# Patient Record
Sex: Male | Born: 1935 | Race: White | Hispanic: No | Marital: Married | Smoking: Former smoker
Health system: Southern US, Community
[De-identification: ages and names within clinical notes are randomized; demographics above are authoritative.]

## PROBLEM LIST (undated history)

## (undated) DIAGNOSIS — E785 Hyperlipidemia, unspecified: Secondary | ICD-10-CM

## (undated) DIAGNOSIS — D1803 Hemangioma of intra-abdominal structures: Principal | ICD-10-CM

## (undated) DIAGNOSIS — R600 Localized edema: Secondary | ICD-10-CM

## (undated) DIAGNOSIS — I272 Pulmonary hypertension, unspecified: Secondary | ICD-10-CM

## (undated) DIAGNOSIS — I119 Hypertensive heart disease without heart failure: Secondary | ICD-10-CM

## (undated) DIAGNOSIS — I6203 Nontraumatic chronic subdural hemorrhage: Secondary | ICD-10-CM

## (undated) DIAGNOSIS — I471 Supraventricular tachycardia, unspecified: Secondary | ICD-10-CM

## (undated) DIAGNOSIS — N529 Male erectile dysfunction, unspecified: Secondary | ICD-10-CM

## (undated) DIAGNOSIS — I5032 Chronic diastolic (congestive) heart failure: Secondary | ICD-10-CM

## (undated) DIAGNOSIS — I071 Rheumatic tricuspid insufficiency: Secondary | ICD-10-CM

## (undated) DIAGNOSIS — I951 Orthostatic hypotension: Secondary | ICD-10-CM

## (undated) DIAGNOSIS — J9811 Atelectasis: Secondary | ICD-10-CM

## (undated) DIAGNOSIS — N200 Calculus of kidney: Secondary | ICD-10-CM

## (undated) DIAGNOSIS — N289 Disorder of kidney and ureter, unspecified: Secondary | ICD-10-CM

## (undated) DIAGNOSIS — N183 Chronic kidney disease, stage 3 unspecified: Secondary | ICD-10-CM

## (undated) DIAGNOSIS — L98499 Non-pressure chronic ulcer of skin of other sites with unspecified severity: Secondary | ICD-10-CM

## (undated) DIAGNOSIS — M109 Gout, unspecified: Secondary | ICD-10-CM

## (undated) DIAGNOSIS — C61 Malignant neoplasm of prostate: Secondary | ICD-10-CM

## (undated) DIAGNOSIS — R5382 Chronic fatigue, unspecified: Secondary | ICD-10-CM

## (undated) DIAGNOSIS — M1990 Unspecified osteoarthritis, unspecified site: Secondary | ICD-10-CM

## (undated) DIAGNOSIS — I480 Paroxysmal atrial fibrillation: Secondary | ICD-10-CM

## (undated) DIAGNOSIS — I340 Nonrheumatic mitral (valve) insufficiency: Secondary | ICD-10-CM

## (undated) DIAGNOSIS — I10 Essential (primary) hypertension: Secondary | ICD-10-CM

## (undated) DIAGNOSIS — M4800 Spinal stenosis, site unspecified: Secondary | ICD-10-CM

## (undated) DIAGNOSIS — E119 Type 2 diabetes mellitus without complications: Secondary | ICD-10-CM

## (undated) DIAGNOSIS — I2510 Atherosclerotic heart disease of native coronary artery without angina pectoris: Secondary | ICD-10-CM

## (undated) HISTORY — PX: CORONARY ARTERY BYPASS GRAFT: SHX141

## (undated) HISTORY — PX: BACK SURGERY: SHX140

## (undated) HISTORY — DX: Malignant neoplasm of prostate: C61

## (undated) HISTORY — DX: Atelectasis: J98.11

## (undated) HISTORY — PX: CARDIAC SURGERY: SHX584

## (undated) HISTORY — DX: Nontraumatic chronic subdural hemorrhage: I62.03

## (undated) HISTORY — DX: Male erectile dysfunction, unspecified: N52.9

## (undated) HISTORY — PX: CERVICAL SPINE SURGERY: SHX589

## (undated) HISTORY — DX: Nonrheumatic mitral (valve) insufficiency: I34.0

## (undated) HISTORY — DX: Pulmonary hypertension, unspecified: I27.20

## (undated) HISTORY — DX: Rheumatic tricuspid insufficiency: I07.1

## (undated) HISTORY — DX: Chronic diastolic (congestive) heart failure: I50.32

## (undated) HISTORY — DX: Gout, unspecified: M10.9

## (undated) HISTORY — PX: OTHER SURGICAL HISTORY: SHX169

## (undated) HISTORY — PX: JOINT REPLACEMENT: SHX530

## (undated) HISTORY — PX: EYE SURGERY: SHX253

## (undated) HISTORY — DX: Hypertensive heart disease without heart failure: I11.9

## (undated) HISTORY — DX: Chronic fatigue, unspecified: R53.82

## (undated) HISTORY — DX: Spinal stenosis, site unspecified: M48.00

## (undated) HISTORY — DX: Unspecified osteoarthritis, unspecified site: M19.90

---

## 2002-05-05 ENCOUNTER — Emergency Department (HOSPITAL_COMMUNITY): Admission: EM | Admit: 2002-05-05 | Discharge: 2002-05-06 | Payer: Self-pay | Admitting: Emergency Medicine

## 2002-05-05 ENCOUNTER — Encounter: Payer: Self-pay | Admitting: Emergency Medicine

## 2002-06-08 ENCOUNTER — Ambulatory Visit (HOSPITAL_COMMUNITY): Admission: RE | Admit: 2002-06-08 | Discharge: 2002-06-08 | Payer: Self-pay | Admitting: *Deleted

## 2002-06-08 ENCOUNTER — Encounter: Payer: Self-pay | Admitting: *Deleted

## 2002-07-07 ENCOUNTER — Ambulatory Visit (HOSPITAL_COMMUNITY): Admission: RE | Admit: 2002-07-07 | Discharge: 2002-07-07 | Payer: Self-pay | Admitting: *Deleted

## 2002-07-07 ENCOUNTER — Encounter: Payer: Self-pay | Admitting: *Deleted

## 2002-08-27 ENCOUNTER — Ambulatory Visit (HOSPITAL_COMMUNITY): Admission: RE | Admit: 2002-08-27 | Discharge: 2002-08-27 | Payer: Self-pay

## 2002-09-07 ENCOUNTER — Encounter: Payer: Self-pay | Admitting: Neurological Surgery

## 2002-09-07 ENCOUNTER — Inpatient Hospital Stay (HOSPITAL_COMMUNITY): Admission: RE | Admit: 2002-09-07 | Discharge: 2002-09-08 | Payer: Self-pay | Admitting: Neurological Surgery

## 2002-10-26 ENCOUNTER — Ambulatory Visit (HOSPITAL_COMMUNITY): Admission: RE | Admit: 2002-10-26 | Discharge: 2002-10-26 | Payer: Self-pay | Admitting: Neurological Surgery

## 2002-10-26 ENCOUNTER — Encounter: Payer: Self-pay | Admitting: Neurological Surgery

## 2002-10-31 ENCOUNTER — Emergency Department (HOSPITAL_COMMUNITY): Admission: EM | Admit: 2002-10-31 | Discharge: 2002-11-01 | Payer: Self-pay

## 2002-12-21 ENCOUNTER — Encounter: Admission: RE | Admit: 2002-12-21 | Discharge: 2002-12-21 | Payer: Self-pay | Admitting: Neurological Surgery

## 2002-12-21 ENCOUNTER — Encounter: Payer: Self-pay | Admitting: Radiology

## 2002-12-21 ENCOUNTER — Encounter: Payer: Self-pay | Admitting: Neurological Surgery

## 2003-05-27 ENCOUNTER — Inpatient Hospital Stay (HOSPITAL_COMMUNITY): Admission: EM | Admit: 2003-05-27 | Discharge: 2003-06-01 | Payer: Self-pay | Admitting: Emergency Medicine

## 2003-05-28 ENCOUNTER — Encounter: Payer: Self-pay | Admitting: Internal Medicine

## 2003-09-10 ENCOUNTER — Inpatient Hospital Stay (HOSPITAL_COMMUNITY): Admission: EM | Admit: 2003-09-10 | Discharge: 2003-09-12 | Payer: Self-pay | Admitting: Emergency Medicine

## 2004-01-01 ENCOUNTER — Emergency Department (HOSPITAL_COMMUNITY): Admission: EM | Admit: 2004-01-01 | Discharge: 2004-01-01 | Payer: Self-pay | Admitting: Emergency Medicine

## 2004-02-02 ENCOUNTER — Ambulatory Visit (HOSPITAL_COMMUNITY): Admission: RE | Admit: 2004-02-02 | Discharge: 2004-02-02 | Payer: Self-pay | Admitting: Family Medicine

## 2004-03-28 ENCOUNTER — Ambulatory Visit (HOSPITAL_COMMUNITY): Admission: RE | Admit: 2004-03-28 | Discharge: 2004-03-28 | Payer: Self-pay | Admitting: Orthopedic Surgery

## 2004-05-01 ENCOUNTER — Observation Stay (HOSPITAL_COMMUNITY): Admission: RE | Admit: 2004-05-01 | Discharge: 2004-05-02 | Payer: Self-pay | Admitting: Orthopedic Surgery

## 2004-05-04 ENCOUNTER — Encounter (HOSPITAL_COMMUNITY): Admission: RE | Admit: 2004-05-04 | Discharge: 2004-05-05 | Payer: Self-pay | Admitting: Orthopedic Surgery

## 2004-05-07 ENCOUNTER — Encounter (HOSPITAL_COMMUNITY): Admission: RE | Admit: 2004-05-07 | Discharge: 2004-06-06 | Payer: Self-pay | Admitting: Orthopedic Surgery

## 2004-06-08 ENCOUNTER — Encounter (HOSPITAL_COMMUNITY): Admission: RE | Admit: 2004-06-08 | Discharge: 2004-07-08 | Payer: Self-pay | Admitting: Orthopedic Surgery

## 2004-06-11 ENCOUNTER — Ambulatory Visit: Payer: Self-pay | Admitting: Orthopedic Surgery

## 2004-07-09 ENCOUNTER — Encounter (HOSPITAL_COMMUNITY): Admission: RE | Admit: 2004-07-09 | Discharge: 2004-08-10 | Payer: Self-pay | Admitting: Orthopedic Surgery

## 2004-08-13 ENCOUNTER — Ambulatory Visit: Payer: Self-pay | Admitting: Orthopedic Surgery

## 2004-11-26 ENCOUNTER — Ambulatory Visit: Payer: Self-pay | Admitting: Orthopedic Surgery

## 2005-10-24 ENCOUNTER — Inpatient Hospital Stay (HOSPITAL_COMMUNITY): Admission: EM | Admit: 2005-10-24 | Discharge: 2005-10-26 | Payer: Self-pay | Admitting: Emergency Medicine

## 2005-10-25 ENCOUNTER — Ambulatory Visit: Payer: Self-pay | Admitting: Internal Medicine

## 2005-12-25 ENCOUNTER — Ambulatory Visit (HOSPITAL_COMMUNITY): Admission: RE | Admit: 2005-12-25 | Discharge: 2005-12-25 | Payer: Self-pay | Admitting: Internal Medicine

## 2005-12-25 ENCOUNTER — Ambulatory Visit: Payer: Self-pay | Admitting: Internal Medicine

## 2006-01-30 ENCOUNTER — Emergency Department (HOSPITAL_COMMUNITY): Admission: EM | Admit: 2006-01-30 | Discharge: 2006-01-30 | Payer: Self-pay | Admitting: Emergency Medicine

## 2006-02-26 ENCOUNTER — Ambulatory Visit: Payer: Self-pay | Admitting: Orthopedic Surgery

## 2006-05-24 ENCOUNTER — Ambulatory Visit: Payer: Self-pay | Admitting: Physician Assistant

## 2006-06-03 ENCOUNTER — Encounter (HOSPITAL_COMMUNITY): Admission: RE | Admit: 2006-06-03 | Discharge: 2006-07-03 | Payer: Self-pay | Admitting: Unknown Physician Specialty

## 2006-07-04 ENCOUNTER — Encounter (HOSPITAL_COMMUNITY): Admission: RE | Admit: 2006-07-04 | Discharge: 2006-08-03 | Payer: Self-pay | Admitting: Unknown Physician Specialty

## 2006-07-24 ENCOUNTER — Ambulatory Visit: Payer: Self-pay | Admitting: Anesthesiology

## 2006-08-13 ENCOUNTER — Ambulatory Visit: Payer: Self-pay | Admitting: Anesthesiology

## 2006-10-01 ENCOUNTER — Ambulatory Visit: Payer: Self-pay | Admitting: Anesthesiology

## 2006-11-17 ENCOUNTER — Ambulatory Visit: Payer: Self-pay | Admitting: Anesthesiology

## 2007-04-04 ENCOUNTER — Emergency Department (HOSPITAL_COMMUNITY): Admission: EM | Admit: 2007-04-04 | Discharge: 2007-04-04 | Payer: Self-pay | Admitting: Emergency Medicine

## 2007-10-26 ENCOUNTER — Ambulatory Visit (HOSPITAL_COMMUNITY): Admission: RE | Admit: 2007-10-26 | Discharge: 2007-10-26 | Payer: Self-pay | Admitting: Pediatrics

## 2007-10-29 ENCOUNTER — Ambulatory Visit (HOSPITAL_COMMUNITY): Admission: RE | Admit: 2007-10-29 | Discharge: 2007-10-29 | Payer: Self-pay | Admitting: Family Medicine

## 2007-11-16 ENCOUNTER — Emergency Department (HOSPITAL_COMMUNITY): Admission: EM | Admit: 2007-11-16 | Discharge: 2007-11-16 | Payer: Self-pay | Admitting: Emergency Medicine

## 2008-01-26 ENCOUNTER — Emergency Department (HOSPITAL_COMMUNITY): Admission: EM | Admit: 2008-01-26 | Discharge: 2008-01-26 | Payer: Self-pay | Admitting: Emergency Medicine

## 2008-02-03 ENCOUNTER — Emergency Department (HOSPITAL_COMMUNITY): Admission: EM | Admit: 2008-02-03 | Discharge: 2008-02-03 | Payer: Self-pay | Admitting: Emergency Medicine

## 2009-01-12 ENCOUNTER — Ambulatory Visit (HOSPITAL_COMMUNITY): Admission: RE | Admit: 2009-01-12 | Discharge: 2009-01-12 | Payer: Self-pay | Admitting: Family Medicine

## 2009-01-23 ENCOUNTER — Ambulatory Visit (HOSPITAL_COMMUNITY): Admission: RE | Admit: 2009-01-23 | Discharge: 2009-01-23 | Payer: Self-pay | Admitting: Family Medicine

## 2009-06-10 ENCOUNTER — Emergency Department (HOSPITAL_COMMUNITY): Admission: EM | Admit: 2009-06-10 | Discharge: 2009-06-10 | Payer: Self-pay | Admitting: Emergency Medicine

## 2010-03-28 ENCOUNTER — Ambulatory Visit (HOSPITAL_COMMUNITY): Admission: RE | Admit: 2010-03-28 | Discharge: 2010-03-28 | Payer: Self-pay | Admitting: Family Medicine

## 2010-04-04 ENCOUNTER — Encounter (HOSPITAL_COMMUNITY): Admission: RE | Admit: 2010-04-04 | Discharge: 2010-05-04 | Payer: Self-pay | Admitting: Family Medicine

## 2010-05-09 ENCOUNTER — Encounter (HOSPITAL_COMMUNITY): Admission: RE | Admit: 2010-05-09 | Discharge: 2010-06-08 | Payer: Self-pay | Admitting: Family Medicine

## 2010-07-12 ENCOUNTER — Emergency Department (HOSPITAL_COMMUNITY)
Admission: EM | Admit: 2010-07-12 | Discharge: 2010-07-12 | Payer: Self-pay | Source: Home / Self Care | Admitting: Emergency Medicine

## 2010-08-27 ENCOUNTER — Encounter: Payer: Self-pay | Admitting: Family Medicine

## 2010-11-07 LAB — DIFFERENTIAL
Basophils Absolute: 0.1 10*3/uL (ref 0.0–0.1)
Basophils Relative: 1 % (ref 0–1)
Eosinophils Absolute: 0.4 10*3/uL (ref 0.0–0.7)
Eosinophils Relative: 4 % (ref 0–5)
Lymphocytes Relative: 42 % (ref 12–46)
Lymphs Abs: 4.2 10*3/uL — ABNORMAL HIGH (ref 0.7–4.0)
Monocytes Absolute: 0.8 10*3/uL (ref 0.1–1.0)
Monocytes Relative: 8 % (ref 3–12)
Neutro Abs: 4.5 10*3/uL (ref 1.7–7.7)
Neutrophils Relative %: 45 % (ref 43–77)

## 2010-11-07 LAB — POCT CARDIAC MARKERS
CKMB, poc: 2.1 ng/mL (ref 1.0–8.0)
CKMB, poc: 2.9 ng/mL (ref 1.0–8.0)
Myoglobin, poc: 180 ng/mL (ref 12–200)
Myoglobin, poc: 261 ng/mL (ref 12–200)
Troponin i, poc: <0.05 ng/mL (ref 0.00–0.09)
Troponin i, poc: <0.05 ng/mL (ref 0.00–0.09)

## 2010-11-07 LAB — CBC
HCT: 45.1 % (ref 39.0–52.0)
Hemoglobin: 15.6 g/dL (ref 13.0–17.0)
MCHC: 34.7 g/dL (ref 30.0–36.0)
MCV: 92.5 fL (ref 78.0–100.0)
Platelets: 152 10*3/uL (ref 150–400)
RBC: 4.88 MIL/uL (ref 4.22–5.81)
RDW: 13.5 % (ref 11.5–15.5)
WBC: 10.1 10*3/uL (ref 4.0–10.5)

## 2010-11-07 LAB — BASIC METABOLIC PANEL
Calcium: 9.8 mg/dL (ref 8.4–10.5)
Chloride: 101 mEq/L (ref 96–112)
Creatinine, Ser: 1.24 mg/dL (ref 0.4–1.5)
Potassium: 2.9 mEq/L — ABNORMAL LOW (ref 3.5–5.1)

## 2010-12-21 NOTE — Op Note (Signed)
NAMEIVAR, DOMANGUE               ACCOUNT NO.:  [id number]   MEDICAL RECORD NO.:  [id number]          PATIENT TYPE:  INP   LOCATION:  A210                          FACILITY:  [facility]   PHYSICIAN:  Lionel December, M.D.    DATE OF BIRTH:  [birth year]   DATE OF PROCEDURE:  10/25/2005  DATE OF DISCHARGE:  10/26/2005                                 OPERATIVE REPORT   PROCEDURE:  Esophagogastroduodenoscopy.   INDICATIONS:  Mr. Awbrey is a 77 year old Caucasian male who presents with  melena.  He has history of peptic ulcer disease in February 2005.  At that  time he was diagnosed with H. pylori gastritis, but he could never be  contacted so he could be treated.  He is undergoing diagnostic EGD.  Procedure and risks were reviewed with the patient and informed consent was  obtained.   MEDS FOR CONSCIOUS SEDATION:  Benzocaine spray for pharyngeal topical  anesthesia Demerol 25 mg IV, Versed 4 mg IV.   FINDINGS:  Procedure performed in endoscopy suite.  The patient's vital  signs and O2 saturations were monitored during procedure and remained  stable.  The patient was placed left lateral position and Olympus videoscope  was passed via oropharynx without any difficulty into esophagus.   ESOPHAGUS:  Mucosa of the esophagus normal.  GE junction was at 41 cm from  the incisors.   STOMACH:  It was empty and distended very well with insufflation.  The folds  of proximal stomach were normal.  Examination of the mucosa revealed diffuse  changes of gastritis.  He had granularity and erythema.  He had a prepyloric  scar to the left of pyloric channel.  He had a pyloric channel ulcer which  was about 1 cm in diameter with a clean base; pyloric channel was not  narrowed.  Scope was easily passed across and into the bulb.  Angularis,  fundus, and cardia were examined by retroflexing the scope; and no other  abnormality noted.   DUODENUM:  Bulbar mucosa was normal.  Scope was passed into the second part  of the duodenum where mucosa and folds were normal.  Endoscope was  withdrawn.  The patient tolerated the procedure well.   FINAL DIAGNOSIS:  1.  Diffuse gastritis secondary to Helicobacter pylori infection which will      be treated in the near future.  2.  Pyloric channel ulcer with a clean base, but felt to be the source of      the patient's recent gastrointestinal blood loss.   RECOMMENDATIONS:  1.  Will change his PPI to p.o. at double dose.  2.  We will advance diet to 4 gm sodium diet.  3.  No aspirin for at least 2 weeks.  4.  H. pylori therapy can be initiated at the time of discharge.  5.  We would offer him colonoscopy for screening purposes in a couple      months.  If he is interested, we could be glad to remind him.      Lionel December, M.D.  Electronically Signed     NR/MEDQ  D:  10/25/2005  T:  10/28/2005  Job:  [id number]   cc:   Dr. Rito Ehrlich

## 2010-12-21 NOTE — Op Note (Signed)
NAMEWOODSON, MACHA                         ACCOUNT NO.:  [id number]   MEDICAL RECORD NO.:  [id number]                   PATIENT TYPE:  INP   LOCATION:  A213                                 FACILITY:  [facility]   PHYSICIAN:  Lionel December, M.D.                 DATE OF BIRTH:  [birth year]   DATE OF PROCEDURE:  09/12/2003  DATE OF DISCHARGE:                                 OPERATIVE REPORT   PROCEDURE:  Esophagogastroduodenoscopy.   ENDOSCOPIST:  Lionel December, M.D.   INDICATIONS:  Mr. Berhane is a 77 year old Caucasian male who presents with  upper GI bleed.  He has remote history of peptic ulcer disease and he is on  ASA for antiplatelet function.  Procedure and risks were reviewed with the  patient and informed consent was obtained.   PREOPERATIVE MEDICATIONS:  Cetacaine spray for oropharyngeal topical  anesthesia, Demerol 25 mg IV and Versed 4 mg IV in divided dose.   FINDINGS:  Procedure performed in endoscopy suite.  The patient's vital  signs and O2 saturation were monitored during the procedure and remained  stable.  The patient was placed in the left lateral recumbent position and  Olympus videoscope was passed via the oropharynx without any difficulty into  the esophagus.   ESOPHAGUS:  Mucosa of the esophagus normal throughout.  Squamocolumnar  junction was wavy otherwise unremarkable.   STOMACH:  It was empty and distended very well with insufflation.  The folds  of the proximal stomach were normal.  There was a large scar at the antrum  along the posterior wall.  There was a tiny antral ulcer along with a deep 5-  6 mm prepyloric ulcer along the posterior wall.  It had a clean base.  The  mucosa around it was markedly swollen and erythematous.  The pyloric channel  was patent.  The angularis, fundus, and cardia were examined by retroflexing  the scope and were normal.   DUODENUM:  Examination of the bulb revealed patchy erythema and edema.  The  mucosa and folds of the  second part of the duodenum were normal.   The endoscope was withdrawn.  The patient tolerated the procedure well.   FINAL DIAGNOSES:  1. Prepyloric gastric ulcer without stigmata of bleeding.  2. Another small antral ulcer along with a large gastric scar.  3. Bulbar duodenitis.   RECOMMENDATIONS:  1. Will advance his diet.  2. Will change PPI to p.o.  3. H. pylori serology.  4. Follow up EGD in 10 weeks.  5. Will also get records of colonoscopy from [facility].  6. Unless he has had a recent colonoscopy, would offer a screening     colonoscopy at the time of next EGD.  7. We should hold his ASA for at a few weeks or maybe until his next EGD.      ___________________________________________  Lionel December, M.D.   NR/MEDQ  D:  09/12/2003  T:  09/12/2003  Job:  [id number]   cc:   Vania Rea, M.D.

## 2010-12-21 NOTE — Discharge Summary (Signed)
NAMEMICHA, DOSANJH               ACCOUNT NO.:  [id number]   MEDICAL RECORD NO.:  [id number]          PATIENT TYPE:  INP   LOCATION:  A307                          FACILITY:  [facility]   PHYSICIAN:  Vickki Hearing, M.D.DATE OF BIRTH:  1929-02-13   DATE OF ADMISSION:  05/01/2004  DATE OF DISCHARGE:  09/28/2005LH                                 DISCHARGE SUMMARY   ADMITTING DIAGNOSIS:  Right rotator cuff tear.   DISCHARGE DIAGNOSIS:  Right rotator cuff tear.   SURGICAL PROCEDURES:  Open right rotator cuff repair with Mumford procedure.   HOSPITAL COURSE:  Mr. Tones was admitted for same-day surgery.  He  underwent uncomplicated right rotator cuff repair with an open Mumford  procedure.  Did well.  Was admitted for pain control.  Was able to ambulate.  Had pain level less than or equal to five and was discharged to home.   VITAL SIGNS:  At the time of discharge, his vital signs were stable.  EXTREMITIES:  His hand had normal function.  His pain was reasonably  controlled.   DISCHARGE MEDICATIONS:  Lorcet Plus one every four hours p.r.n. for pain,  No. 60 with five refills.   He will follow-up with me in about 10 days to get the staples out.  He can  start occupational therapy within one week.   CONDITION:  Improved.   DISPOSITION:  To home.      SEH/MEDQ  D:  05/02/2004  T:  05/02/2004  Job:  [id number]

## 2010-12-21 NOTE — Discharge Summary (Signed)
[name], [name]               ACCOUNT NO.:  [id number]   MEDICAL RECORD NO.:  [id number]          PATIENT TYPE:  INP   LOCATION:  A210                          FACILITY:  [facility]   PHYSICIAN:  [name], M.D.     DATE OF BIRTH:  03-Jan-1929   DATE OF ADMISSION:  10/24/2005  DATE OF DISCHARGE:  03/24/2007LH                                 DISCHARGE SUMMARY   DISCHARGE DIAGNOSES:  1.  Pyloric channel ulcer positive for Helicobacter pylori.  2.  Anemia which has been stable since admission.  He does have low ferritin      levels.  3.  Hypertension   DISCHARGE MEDICATIONS:  1.  Prilosec 40 mg b.i.d.  2.  Prevpac to be taken x14 days.  3.  Iron sulfate 325 mg b.i.d.  4.  Aspirin 81 mg daily.  5.  Hydrochlorothiazide 25 mg daily.  6.  Altace 10 mg daily.  7.  Zyrtec 10 mg daily.  8.  Darvocet-N 100 q.4h. p.r.n.   PROCEDURE:  Esophagogastroduodenoscopy done on March 23, with results that  showed esophagus was normal, stomach showed diffuse gastritis, a 1 cm  pyloric channel ulcer with a clean base, duodenum 1 and duodenum 2 were  normal.   HOSPITAL COURSE:  This is a 77 year old, white male who was admitted for  history of dark stools.  He was found to be Hemoccult positive in doctor's  office and sent to the ER.  His H&H remained stable, however, he was dizzy  and required IV fluids.  His aspirin and his blood pressure medications were  held.  His blood pressure on arrival was 113/61.  He was kept n.p.o. for an  endoscopy the next morning.  Endoscopy results are mentioned above.  Dr.  Karilyn Cota has recommended a PPI b.i.d. and a Prevpac.  In addition, he will be  following up with a colonoscopy a few weeks from now.   The patient is no longer complaining of any dizziness.  He is not having any  more dark stools.  I have explained to him the prescriptions that I am  giving him and I have written down and explained to him how to take these  medications.  Dr. Karilyn Cota has explained  these to him as well.  It has been  advised that he not take the Prilosec while he is taking the Prevpac.  It  can be started afterwards.   The patient is anemic.  An iron profile showed the following results:  Ferritin was 42, which is on the low side of normal, iron level was 90, iron  binding 304, iron saturation 30, folic acid was greater than 20.  Vitamin  B12 was 642.   The patient will be placed on iron sulfate to take for a few months.   DISCHARGE LABORATORY DATA AND X-RAY FINDINGS:  Discharge blood work showed  WBC 7.4, hemoglobin 10.7, hematocrit 30.8, MCV 90.2, platelets 154.  PT was  14.7, INR was 1.1   FOLLOW UP:  Follow up with Dr. Karilyn Cota.  He will be called with the  appointment.  Follow up with Dr. Milinda Cave.   DIET:  I have told him to avoid spicy, oil foods and caffeine.  He is to  continue on a low-salt diet for hypertension.      [name], M.D.  Electronically Signed     SR/MEDQ  D:  10/26/2005  T:  10/28/2005  Job:  [id number]   cc:   Jeoffrey Massed, MD  Fax: [phone]
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
## 2010-12-21 NOTE — Op Note (Signed)
NAME:  [name], [name]                         ACCOUNT NO.:  [id number]   MEDICAL RECORD NO.:  [id number]                   PATIENT TYPE:  INP   LOCATION:  3172                                 FACILITY:  [facility]   PHYSICIAN:  Stefani Dama, M.D.               DATE OF BIRTH:  [birth year]   DATE OF PROCEDURE:  09/07/2002  DATE OF DISCHARGE:                                 OPERATIVE REPORT   PREOPERATIVE DIAGNOSES:  C4-5 spondylosis with cervical myelopathy.   POSTOPERATIVE DIAGNOSES:  C4-5 spondylosis with cervical myelopathy.   PROCEDURE:  Anterior cervical diskectomy and decompression of spinal cord  and nerve roots, C4-5 structural arthrodesis with structural allograft  Synthes plate fixation C4 and C5.   SURGEON:  Stefani Dama, M.D.   FIRST ASSISTANT:  Cristi Loron, M.D.   ANESTHESIA:  General endotracheal.   INDICATIONS FOR PROCEDURE:  The patient is a 77 year old individual whose  had significant, neck, shoulder and arm pain with dysesthetic  numbness in  the hands. He has been advised that he has severe spondylitic disease with  cord compression at C4-5. He was taken to the operating room to undergo  surgical decompression and stabilization.   DESCRIPTION OF PROCEDURE:  The patient was brought to the operating room  supine on the stretcher. After smooth induction of general endotracheal  anesthesia, he was placed in 5 pounds of halter traction and neck was  shaved, prepped with Duraprep, draped in a sterile fashion. A transverse  incision was made in the left side of the neck and carried down through the  platysma. The plane between the sternocleidomastoid and the strap muscles  was dissected bluntly until the prevertebral space was reached. The first  identifiable disk space was noted to be that of C5-6. Dissection was carried  cephalad and the longus colli muscle was stripped on the left side so as to  allow placement of a Caspar retractor. Then by gradually  uncovering a large  ventral osteophyte, the osteophyte was removed with a pair of rongeurs. The  disk space was then entered at C4-C5. The disk space was evacuated of a  significant quantity of markedly degenerated disk material. As the region of  the posterior longitudinal ligament was reached, there was noted to be an  identifiable large posterior bony spur from the inferior margin of C4. This  was drilled down with a 2.3 mm dissecting drill and a high speed drill bit.  Eventually the bone was broken into little bits and pieces and removed in a  piecemeal fashion. Ultimately the posterior longitudinal ligament was opened  completely with a 2 mm and then a 3 mm Kerrison punch. The superior endplate  of C5 was similarly decompressed and recontoured. In the end, the central  canal was well decompressed. Lateral recesses were well decompressed.  Hemostasis from the epidural space was obtained with some small pledgets of  Gelfoam soaked in thrombin which were later irrigated away. A 7 mm  tricortical bone graft was then fashioned and with the help of Dr. Lovell Sheehan  we secured a ventral plate 18 mm in length to the ventral aspects of C4 and  C5. This was held in position with four locking 4 x 14 mm screws. Traction  was removed prior to doing this and after this was completed, localizing  radiograph identified good position of the construct. The area was copiously  irrigated with antibiotic irrigating  solution. Hemostasis in the soft tissues was doubly and triply checked and  then the platysma was closed with 3-0 Vicryl interrupted fashion, 3-0 Vicryl  was used to close the subcuticular skin. The patient tolerated the procedure  well and was returned to the recovery room in stable condition.                                               Stefani Dama, M.D.    Merla Riches  D:  09/07/2002  T:  09/07/2002  Job:  [id number]
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
## 2010-12-21 NOTE — Op Note (Signed)
[name], [name]               ACCOUNT NO.:  [id number]   MEDICAL RECORD NO.:  [id number]          PATIENT TYPE:  AMB   LOCATION:  DAY                           FACILITY:  [facility]   PHYSICIAN:  Lionel December, M.D.    DATE OF BIRTH:  17-Feb-1929   DATE OF PROCEDURE:  12/25/2005  DATE OF DISCHARGE:                                 OPERATIVE REPORT   PROCEDURE:  Colonoscopy.   INDICATIONS:  [name] is a 77 year old Caucasian male who is undergoing average  risk screening colonoscopy.  The procedure risks were reviewed with the  patient and informed consent was obtained.   MEDS FOR CONSCIOUS SEDATION:  Demerol 25 mg IV, Versed 4 mg IV.   FINDINGS:  The procedure was performed in the endoscopy suite.  The  patient's vital signs and O2 sat were monitored during the procedure and  remained stable.  The patient was placed in the left lateral position and  rectal examination performed.  No abnormality was noted on external or  digital exam.  The Olympus videoscope was placed in the rectum and advanced  under vision into the sigmoid colon and beyond.  Preparation was fair,  distally some areas had to be washed.  The preparation was better in the  proximal colon.  The scope was passed in the cecum which was identified by  the appendiceal orifice and ileocecal valve.  Pictures were taken for the  record.  As the scope was withdrawn, the colonic mucosa was examined for the  second time and there were no polyps, tumor, masses or diverticular changes.  The endoscope was straightened and withdrawn.  The rectal mucosa was normal.  The scope was retroflexed to examine the anorectal junction which was  unremarkable.  The endoscope was straightened and withdrawn.  The patient  tolerated the procedure well.   FINAL DIAGNOSIS:  Normal colonoscopy.   RECOMMENDATIONS:  Should continue yearly Hemoccults and consider next  screening exam ten years from now.      Lionel December, M.D.  Electronically  Signed     NR/MEDQ  D:  12/25/2005  T:  12/25/2005  Job:  [id number]   cc:   Jeoffrey Massed, MD  Fax: [phone]
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
## 2011-04-30 LAB — POCT CARDIAC MARKERS
Operator id: 213721
Troponin i, poc: <0.05

## 2011-04-30 LAB — CBC
Hemoglobin: 15.1
MCHC: 34.5
Platelets: 163
RDW: 13.1

## 2011-04-30 LAB — BASIC METABOLIC PANEL
Chloride: 103
Creatinine, Ser: 1.21
GFR calc Af Amer: >60
GFR calc non Af Amer: 58 — ABNORMAL LOW

## 2011-04-30 LAB — DIFFERENTIAL
Eosinophils Absolute: 0.3
Lymphocytes Relative: 39
Lymphs Abs: 2.8
Neutrophils Relative %: 49

## 2011-04-30 LAB — B-NATRIURETIC PEPTIDE (CONVERTED LAB): Pro B Natriuretic peptide (BNP): 41

## 2011-05-16 ENCOUNTER — Ambulatory Visit: Payer: Medicare Other | Attending: Pediatrics | Admitting: Sleep Medicine

## 2011-05-16 DIAGNOSIS — G4700 Insomnia, unspecified: Secondary | ICD-10-CM

## 2011-05-16 DIAGNOSIS — G4710 Hypersomnia, unspecified: Secondary | ICD-10-CM | POA: Insufficient documentation

## 2011-05-16 DIAGNOSIS — Z6830 Body mass index (BMI) 30.0-30.9, adult: Secondary | ICD-10-CM | POA: Insufficient documentation

## 2011-05-27 NOTE — Procedures (Signed)
NAMETOLLIE, [place]               ACCOUNT NO.:  [id number]  MEDICAL RECORD NO.:  [id number]          PATIENT TYPE:  OUT  LOCATION:  SLEEP CENTER                 FACILITY:  [facility]  PHYSICIAN:  Altheria Shadoan A. Gerilyn Pilgrim, M.D.      DATE OF BIRTH:  DATE OF STUDY:  05/16/2011                           NOCTURNAL POLYSOMNOGRAM  REFERRING PHYSICIAN:  Francoise Schaumann. Halm, DO, FAAP  INDICATION FOR STUDY:  An 77 year old who presents with hypersomnia, witnessed apnea, and snoring.  This study is being done to evaluate for obstructive sleep apnea syndrome.  EPWORTH SLEEPINESS SCORE: 1. BMI 30.  MEDICATIONS:  Metoprolol.  SLEEP ARCHITECTURE:  The total recording time is 367 minutes, sleep efficiency 64%, sleep latency is 21 minutes. REM latency calculated by the computer is 47, but recalculated manually and more accurately at 69.5.  Stage N1 of 11%, N2 of 54%, N3 of 0%, and REM sleep 31%. Risk factors; baseline oxygen saturation is 96, lowest saturation 90 during REM sleep.  Diagnostic AHI 1 and RDI 1.3.  CARDIAC DATA:  Average heart rate is 58 with no significant dysrhythmias observed.  MOVEMENT-PARASOMNIA:  PLM index zero.  IMPRESSIONS-RECOMMENDATIONS:  Unremarkable nocturnal polysomnography.  Thank you for this referral.     Sandra Tellefsen A. Gerilyn Pilgrim, M.D.    KAD/MEDQ  D:  05/18/2011 16:36:41  T:  05/18/2011 20:43:38  Job:  [id number]

## 2011-12-14 ENCOUNTER — Encounter (HOSPITAL_COMMUNITY): Payer: Self-pay | Admitting: *Deleted

## 2011-12-14 ENCOUNTER — Emergency Department (HOSPITAL_COMMUNITY): Payer: Medicare Other

## 2011-12-14 ENCOUNTER — Emergency Department (HOSPITAL_COMMUNITY)
Admission: EM | Admit: 2011-12-14 | Discharge: 2011-12-14 | Disposition: A | Discharge status: Home / Self Care | Payer: Medicare Other | Attending: Emergency Medicine | Admitting: Emergency Medicine

## 2011-12-14 DIAGNOSIS — I471 Supraventricular tachycardia: Secondary | ICD-10-CM

## 2011-12-14 DIAGNOSIS — I498 Other specified cardiac arrhythmias: Secondary | ICD-10-CM | POA: Insufficient documentation

## 2011-12-14 DIAGNOSIS — R61 Generalized hyperhidrosis: Secondary | ICD-10-CM | POA: Insufficient documentation

## 2011-12-14 DIAGNOSIS — I10 Essential (primary) hypertension: Secondary | ICD-10-CM | POA: Insufficient documentation

## 2011-12-14 DIAGNOSIS — R002 Palpitations: Secondary | ICD-10-CM | POA: Insufficient documentation

## 2011-12-14 HISTORY — DX: Essential (primary) hypertension: I10

## 2011-12-14 LAB — DIFFERENTIAL
Basophils Absolute: 0.1 10*3/uL (ref 0.0–0.1)
Eosinophils Absolute: 0.1 10*3/uL (ref 0.0–0.7)
Lymphocytes Relative: 21 % (ref 12–46)
Lymphs Abs: 1.3 10*3/uL (ref 0.7–4.0)
Monocytes Absolute: 0.3 10*3/uL (ref 0.1–1.0)
Neutro Abs: 4.5 10*3/uL (ref 1.7–7.7)

## 2011-12-14 LAB — COMPREHENSIVE METABOLIC PANEL
Albumin: 3.8 g/dL (ref 3.5–5.2)
BUN: 14 mg/dL (ref 6–23)
Calcium: 9.8 mg/dL (ref 8.4–10.5)
Creatinine, Ser: 0.98 mg/dL (ref 0.50–1.35)
GFR calc Af Amer: 88 mL/min — ABNORMAL LOW (ref >90)
Potassium: 4.2 mEq/L (ref 3.5–5.1)
Total Protein: 7.5 g/dL (ref 6.0–8.3)

## 2011-12-14 LAB — CBC
HCT: 47.9 % (ref 39.0–52.0)
MCH: 31.7 pg (ref 26.0–34.0)
MCHC: 34.4 g/dL (ref 30.0–36.0)
RDW: 13.4 % (ref 11.5–15.5)

## 2011-12-14 LAB — POCT I-STAT TROPONIN I: Troponin i, poc: 0.07 ng/mL (ref 0.00–0.08)

## 2011-12-14 MED ORDER — ADENOSINE 6 MG/2ML IV SOLN
12.0000 mg | Freq: Once | INTRAVENOUS | Status: DC
  Filled 2011-12-14: qty 4

## 2011-12-14 MED ORDER — SODIUM CHLORIDE 0.9 % IV SOLN
INTRAVENOUS | Status: DC
  Administered 2011-12-14: 16:00:00 via INTRAVENOUS

## 2011-12-14 NOTE — Discharge Instructions (Signed)
Mr. Huntsberry, you had an episode of rapid heart beat called supraventricular tachycardia.  It has resolved all by itself.  You need to continue to take the medicine metoprolol; that medicine helps keep your heartbeat slow.  Followup with your doctor in [place], Dr. Milford Cage.  He may wish to refer you to a cardiologist, such as Dr. Kelley Bing.

## 2011-12-14 NOTE — ED Notes (Addendum)
Patient with c/o nausea and abdominal pain.  Patient denies any chest pain or shortness of breath.  Patient heart rate is 148 palpated manually/radial

## 2011-12-14 NOTE — ED Notes (Signed)
Patient was discharged with family and instructions given, Family present. Denies pain or discomfort or problems at the time of discharge.

## 2011-12-14 NOTE — ED Provider Notes (Signed)
History     CSN: [id number]  Arrival date & time 12/14/11  1458   None     Chief Complaint  Patient presents with  . Nausea    (Consider location/radiation/quality/duration/timing/severity/associated sxs/prior treatment) HPI Comments: Patient is an 78 year old man who says he started with a rapid heartbeat around 11 this morning. He has had recurring bouts of this. He tries to explain the physiology of this by saying he has 2 veins which if they come together it makes his heart go off. He has been told in the past to put his head in both ice water to stop it hurts to sit on the toilet and strain. He tried these maneuvers but they did not help. Review of records shows that he has been treated for supraventricular tachycardia with adenosine in 2010 with success. He is unable to tell me who his cardiologist is.  Patient is a 78 y.o. male presenting with palpitations. The history is provided by the patient and medical records. No language interpreter was used.  Palpitations  This is a recurrent problem. The current episode started 1 to 2 hours ago. The problem occurs constantly. The problem has not changed since onset.Associated with: Nothing. On average, each episode lasts 4 hours. Associated symptoms include diaphoresis. Pertinent negatives include no chest pain and no syncope. Treatments tried: He tried immersing is face in ice water, and straining at stool, both without relief.  Risk factors: Prior episodes of PSVT.    Past Medical History  Diagnosis Date  . Hypertension     History reviewed. No pertinent past surgical history.  History reviewed. No pertinent family history.  History  Substance Use Topics  . Smoking status: Never Smoker   . Smokeless tobacco: Not on file  . Alcohol Use: No      Review of Systems  Constitutional: Positive for diaphoresis.  HENT: Negative.   Eyes: Negative.   Respiratory: Negative.   Cardiovascular: Positive for palpitations. Negative for  chest pain and syncope.  Genitourinary: Negative.   Musculoskeletal: Negative.   Neurological: Negative.   Psychiatric/Behavioral: Negative.     Allergies  Review of patient's allergies indicates no known allergies.  Home Medications  No current outpatient prescriptions on file.  BP 121/80  Pulse 148  Temp(Src) 97.4 F (36.3 C) (Oral)  SpO2 98%  Physical Exam  Nursing note and vitals reviewed. Constitutional: He is oriented to person, place, and time. He appears well-developed and well-nourished.       Tchycardic at 148 when I initially examined pt.  He is hard to understand because he is edentulous.  HENT:  Head: Normocephalic and atraumatic.  Right Ear: External ear normal.  Left Ear: External ear normal.  Mouth/Throat: Oropharyngeal exudate (he is edentulous) present.  Eyes: Conjunctivae and EOM are normal. Pupils are equal, round, and reactive to light.  Neck: Normal range of motion. Neck supple.  Cardiovascular: Normal heart sounds.        Regular tachycardia of 148  Pulmonary/Chest: Effort normal and breath sounds normal.  Abdominal: Soft. Bowel sounds are normal.  Musculoskeletal: Normal range of motion. He exhibits no edema and no tenderness.  Neurological: He is alert and oriented to person, place, and time.       No sensory or motor deficits.  Skin: Skin is warm and dry.  Psychiatric: He has a normal mood and affect. His behavior is normal.    ED Course  Procedures (including critical care time)   3:28 PM  Date: 12/14/2011  Rate: 154  Rhythm: supraventricular tachycardia (SVT)  QRS Axis: left  Intervals: normal  ST/T Wave abnormalities: normal  Conduction Disutrbances:none  Narrative Interpretation: Abnormal EKG.  Old EKG Reviewed: changes noted Was in sinus rhythm 07/12/2010.  4:09 PM Patient was seen and had physical examination. We were preparing to give him adenosine, when his rate converted spontaneously to a sinus rhythm with a rate of about  92. Will observe for an hour to see if his PSVT recurs.  5:44 PM Has remained symptomatic.  Released. He says he sees Dr. Milford Cage in [place].  He may want to have referral to a cardiologist, such as Dr. Dietrich Pates, up there.   1. Supraventricular tachycardia            Carleene Cooper III, MD 12/14/11 1747

## 2012-04-08 ENCOUNTER — Ambulatory Visit (HOSPITAL_COMMUNITY)
Admission: RE | Admit: 2012-04-08 | Discharge: 2012-04-08 | Disposition: A | Discharge status: Home / Self Care | Payer: Medicare Other | Source: Ambulatory Visit | Attending: Family Medicine | Admitting: Family Medicine

## 2012-04-08 ENCOUNTER — Other Ambulatory Visit: Payer: Self-pay | Admitting: Family Medicine

## 2012-04-08 DIAGNOSIS — M25551 Pain in right hip: Secondary | ICD-10-CM

## 2012-04-08 DIAGNOSIS — M25559 Pain in unspecified hip: Secondary | ICD-10-CM | POA: Insufficient documentation

## 2012-04-29 ENCOUNTER — Encounter (INDEPENDENT_AMBULATORY_CARE_PROVIDER_SITE_OTHER): Payer: Self-pay | Admitting: *Deleted

## 2012-05-05 ENCOUNTER — Telehealth (HOSPITAL_COMMUNITY): Payer: Self-pay | Admitting: Dietician

## 2012-05-05 NOTE — Telephone Encounter (Signed)
Received referral via fax from Dr. Modesto Charon [facility]) for dx: prediabetes, obesity, hyperlipidemia, gout.

## 2012-05-06 NOTE — Telephone Encounter (Signed)
Unable to reach by phone. Sent letter to pt home via [place] Mail in attempt to contact pt to schedule appointment.

## 2012-05-07 ENCOUNTER — Ambulatory Visit (INDEPENDENT_AMBULATORY_CARE_PROVIDER_SITE_OTHER): Payer: Medicare Other | Admitting: Internal Medicine

## 2012-05-07 ENCOUNTER — Encounter (INDEPENDENT_AMBULATORY_CARE_PROVIDER_SITE_OTHER): Payer: Self-pay | Admitting: Internal Medicine

## 2012-05-07 VITALS — BP 132/60 | HR 64 | Temp 98.2°F | Ht 70.0 in | Wt 213.4 lb

## 2012-05-07 DIAGNOSIS — R1310 Dysphagia, unspecified: Secondary | ICD-10-CM

## 2012-05-07 DIAGNOSIS — I10 Essential (primary) hypertension: Secondary | ICD-10-CM

## 2012-05-07 NOTE — Progress Notes (Signed)
Subjective:     Patient ID: [name], male   DOB: Dec 05, 1928, 78 y.o.   MRN: [id number]  HPIReferred to our office by Dr. Modesto Charon for dysphagia. Occurs rarely. He says an apple may lodge at times. No significant impact on his eating.  He says his mouth is dry in the morning. He sleeps with his mouth open. Appetite is good. Weight loss which was intentional. Occurs about 3-4 times in 6 months. He says he thinks it is caused by his dry mouth. Hx H. Pylori. .EGD 2007 for melena DR. Rehman: FINAL DIAGNOSIS:  1. Diffuse gastritis secondary to Helicobacter pylori infection which will  be treated in the near future.  2. Pyloric channel ulcer with a clean base, but felt to be the source of  the patient's recent gastrointestinal blood loss.  EGD 2005 DR. Rehman for upper GI bleed;FINAL DIAGNOSES:  1. Prepyloric gastric ulcer without stigmata of bleeding.  2. Another small antral ulcer along with a large gastric scar.  3. Bulbar duodenitis.   Review of Systems see hpi Current Outpatient Prescriptions  Medication Sig Dispense Refill  . furosemide (LASIX) 20 MG tablet Take 20 mg by mouth daily.      . metoprolol (LOPRESSOR) 100 MG tablet Take 100 mg by mouth 2 (two) times daily.      . Multiple Vitamin (MULITIVITAMIN WITH MINERALS) TABS Take 1 tablet by mouth daily.      Marland Kitchen omega-3 acid ethyl esters (LOVAZA) 1 G capsule Take 1 g by mouth daily.      . traMADol (ULTRAM) 50 MG tablet Take 50 mg by mouth every 4 (four) hours as needed. For pain      . zolpidem (AMBIEN) 5 MG tablet Take 5 mg by mouth at bedtime.       Past Medical History  Diagnosis Date  . Hypertension    Past Surgical History  Procedure Date  . Back surgery x 2   . Rt rotator cuff repair    History   Social History  . Marital Status: Divorced    Spouse Name: N/A    Number of Children: N/A  . Years of Education: N/A   Occupational History  . Not on file.   Social History Main Topics  . Smoking status: Never  Smoker   . Smokeless tobacco: Not on file  . Alcohol Use: No  . Drug Use: No  . Sexually Active: Not on file   Other Topics Concern  . Not on file   Social History Narrative  . No narrative on file   Family Status  Relation Status Death Age  . Mother Deceased     MI  . Father Deceased     flu  . Brother Deceased     One died MI, one died cancer, one died with complications  from his leg. Two in good health   No Known Allergies      Objective:   Physical Exam  Filed Vitals:   05/07/12 1027  BP: 132/60  Pulse: 64  Temp: 98.2 F (36.8 C)  Height: 5\' 10"  (1.778 m)  Weight: 213 lb 6.4 oz (96.798 kg)        Assessment:   Dysphagia which occurs on a rare occasionally. He is not interested in an EGD/ED at this time.    Plan:    Patient is not interested in having an EGD/ED at this time. May return on a prn basis. Prilosec OTC   No NSAIDs.

## 2012-05-07 NOTE — Patient Instructions (Signed)
OV prn. Prilosed OTC

## 2012-05-08 NOTE — Telephone Encounter (Signed)
Sent letter to pt home via [place] Mail in attempt to contact pt to schedule appointment.  

## 2012-05-13 NOTE — Telephone Encounter (Deleted)
Sent letter to pt home via [place] Mail in attempt to contact pt to schedule appointment.  

## 2012-05-20 ENCOUNTER — Ambulatory Visit: Payer: Medicare Other | Admitting: Orthopedic Surgery

## 2012-05-20 ENCOUNTER — Encounter: Payer: Self-pay | Admitting: Orthopedic Surgery

## 2012-05-21 NOTE — Telephone Encounter (Signed)
Appointment scheduled for Friday, May 22, 2012 at 10:00 AM.

## 2012-05-21 NOTE — Telephone Encounter (Signed)
Left message with Selena Batten, referral coordinator, to inform of pt scheduled appointment.

## 2012-05-22 ENCOUNTER — Encounter (HOSPITAL_COMMUNITY): Payer: Self-pay | Admitting: Dietician

## 2012-05-22 DIAGNOSIS — R5382 Chronic fatigue, unspecified: Secondary | ICD-10-CM | POA: Insufficient documentation

## 2012-05-22 DIAGNOSIS — M1990 Unspecified osteoarthritis, unspecified site: Secondary | ICD-10-CM | POA: Insufficient documentation

## 2012-05-22 DIAGNOSIS — M4800 Spinal stenosis, site unspecified: Secondary | ICD-10-CM | POA: Insufficient documentation

## 2012-05-22 DIAGNOSIS — E8881 Metabolic syndrome: Secondary | ICD-10-CM | POA: Insufficient documentation

## 2012-05-22 DIAGNOSIS — N529 Male erectile dysfunction, unspecified: Secondary | ICD-10-CM | POA: Insufficient documentation

## 2012-05-22 DIAGNOSIS — M109 Gout, unspecified: Secondary | ICD-10-CM | POA: Insufficient documentation

## 2012-05-22 NOTE — Progress Notes (Signed)
Outpatient Initial Nutrition Assessment  Date:05/22/2012   Appt Start Time: 1008  Referring Physician: Dr. Modesto Charon [facility]) Reason for Visit: pre-diabetes  Nutrition Assessment:  Height: 5\' 5"  (165.1 cm)   Weight: 217 lb (98.431 kg)   IBW: 136# %IBW: 106%  UBW:  210# %UBW: 102% Body mass index is 36.11 kg/(m^2).  Goal Weight: 195# (10% weight loss) Weight hx: Pt reports that he has maintained UBW of 210-212# for the past 20-30 years. He denies recent hx of wt loss or wt gain.   Estimated nutritional needs: 1862-2031 kcals daily, 79-99 grams protein daily, 1.9-2.0 L fluid daily  PMH:  Past Medical History  Diagnosis Date  . Hypertension   . OA (osteoarthritis)     rt leg  . Metabolic syndrome   . ED (erectile dysfunction)   . Chronic fatigue   . Gout   . Bowing of leg   . Spinal stenosis   . Prediabetes   . Prostate cancer     Medications:  Current Outpatient Rx  Name Route Sig Dispense Refill  . DICLOFENAC SODIUM 0.1 % OP SOLN  1 drop 2 (two) times daily.    Marland Kitchen DICLOFENAC SODIUM 1 % TD GEL Topical Apply topically 4 (four) times daily as needed.    Marland Kitchen FELODIPINE ER 10 MG PO TB24 Oral Take 10 mg by mouth every morning.    . FENOFIBRATE 48 MG PO TABS Oral Take 48 mg by mouth daily.    Marland Kitchen HYDROXYZINE HCL 25 MG PO TABS Oral Take 25 mg by mouth 3 (three) times daily as needed.    Marland Kitchen METAXALONE 800 MG PO TABS Oral Take 800 mg by mouth 3 (three) times daily.    Marland Kitchen RAMIPRIL 10 MG PO CAPS Oral Take 10 mg by mouth 2 (two) times daily.    Marland Kitchen SILDENAFIL CITRATE 50 MG PO TABS Oral Take 50 mg by mouth daily as needed.    . FUROSEMIDE 20 MG PO TABS Oral Take 20 mg by mouth daily.    Marland Kitchen METOPROLOL TARTRATE 100 MG PO TABS Oral Take 100 mg by mouth 2 (two) times daily.    . ADULT MULTIVITAMIN W/MINERALS CH Oral Take 1 tablet by mouth daily.    . OMEGA-3-ACID ETHYL ESTERS 1 G PO CAPS Oral Take 1 g by mouth daily.    . TRAMADOL HCL 50 MG PO TABS Oral Take 50 mg by mouth every 4  (four) hours as needed. For pain    . ZOLPIDEM TARTRATE 5 MG PO TABS Oral Take 5 mg by mouth at bedtime.      Labs: CMP     Component Value Date/Time   NA 138 12/14/2011 1553   K 4.2 12/14/2011 1553   CL 101 12/14/2011 1553   CO2 21 12/14/2011 1553   GLUCOSE 154* 12/14/2011 1553   BUN 14 12/14/2011 1553   CREATININE 0.98 12/14/2011 1553   CALCIUM 9.8 12/14/2011 1553   PROT 7.5 12/14/2011 1553   ALBUMIN 3.8 12/14/2011 1553   AST 39* 12/14/2011 1553   ALT 26 12/14/2011 1553   ALKPHOS 70 12/14/2011 1553   BILITOT 0.7 12/14/2011 1553   GFRNONAA 76* 12/14/2011 1553   GFRAA 88* 12/14/2011 1553    Lipid Panel  No results found for this basename: chol, trig, hdl, cholhdl, vldl, ldlcalc     No results found for this basename: HGBA1C   Lab Results  Component Value Date   CREATININE 0.98 12/14/2011   Reported lab  results from [facility] on 05/02/12: NA: 134: K: 4.2, Cl: 100, CO2: 29, Glucose: 129, BUN: 15, Creat: 0.95, Total Cholesterol: 172, HDL: 37, LDL: 102, Triglycerides: 165, Hgb A1c: 6.0.   Lifestyle/ social habits: Mr. Aloisi lives alone in [place]. 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 He reports a low stress level, but complains of having "low energy" due to his medications. He does not participate in regular physical activity, but is active; he reports he does his own yard work, house work, garden, and milks cows. He prides himself on being independent and having a fairly healthy lifestyle for someone his age. He reports he has had prediabetes for "a long time" and gets his Hgb A1c every 6 months. He cannot remember what his last reading was prior to this. He recently changed PCPs.   Nutrition hx/habits: Mr. Escue reports that he has been following the same diet for 20-30 years. He tries to eat a variety of foods. He does not have a set schedule to when he eats or wakes up, but tries to eat within 30 minutes of waking up. He obtains many of his  vegetables and milk at home, as his property [place]. He does all of his own cooking and grocery shopping. He eats out 3 x per week with his girlfriend, mostly at [employer]. He uses fat back and sugar, but reports cutting back over the years by only using 1/2 the amount the his recipes call for. He reports the only recent change is eating oodles of noodles daily. He reports that he uses 1/2 of the seasoning packet and adds molasses to the oodles of noodles. He chooses this meal for breakfast 50-75% of the time for breakfast, but denies eating it at other times during the day. He is concerned that eating oodles of noodles is affecting his blood sugar and cholesterol levels. His beverages consist mainly of water and coffee, but he drinks a serving of light colored soda a few times per week.   Diet recall: Breakfast (8:30 AM): oodles of noodles with 1/2 seasoning packet and molasses OR sausage, egg, ham, biscuit OR cereal OR pancakes, water, coffee with sugar and honey; Lunch: sandwich (tomato and cheese OR bacon and egg), water; Dinner: (5-6 PM): corn, salad (lettuce, tomatoes, fruit), protein  Nutrition Diagnosis: Inconsistent carbohydrate intake r/t disordered eating pattern AEB diet recall, Hgb A1c: 6.0.  Nutrition Intervention: Nutrition rx: 1500 NAS, no added sugar diet; 3 meals per day; limit 1 starch per meal; meals every 4-5 hours; low calorie beverages only; physical activity as tolerated  Education/Counseling Provided:Educated pt on principles of general, healthful diet. Discussed importance of following a healthy diet to prevent onset of diabetes. Educated pt on plate method, portion sizes, and sources of carbohydrate. Discussed importance of regular meal pattern. Discussed importance of adding sources of whole grains to diet to improve glycemic control. Also encouraged choosing low fat dairy, lean meats, and whole fruits and vegetables more often. Discussed  nutritional content of foods commonly eaten and discussed healthier alternatives. Used Teach Back method to assess understanding; pt was able to use food models to demonstrate which food groups and food portions comprised a healthy plate. Provided plate method handout.   Understanding, Motivation, Ability to Follow Recommendations: Expect fair compliance.  Monitoring and Evaluation: Goals: 1) 1-2# weight loss per week; 2) Physical activity as tolerated; 3) Hgb A1c </= 6.0  Recommendations: 1)  For weight loss: 1362-1531 kcals daily; 2) Reduce Oodles of Noodles to 1 x per week or less and do not add molasses; 3) Eat meals 4-5 hours apart to improve glycemic control; 4) Wake up and eat meals around the same time each day  F/U: PRN. Provided RD contact information.   Melody Haver, RD, LDN 05/22/2012  Appt EndTime: 1130

## 2012-08-17 ENCOUNTER — Telehealth (INDEPENDENT_AMBULATORY_CARE_PROVIDER_SITE_OTHER): Payer: Self-pay | Admitting: *Deleted

## 2012-08-17 ENCOUNTER — Other Ambulatory Visit (INDEPENDENT_AMBULATORY_CARE_PROVIDER_SITE_OTHER): Payer: Self-pay | Admitting: *Deleted

## 2012-08-17 DIAGNOSIS — R1310 Dysphagia, unspecified: Secondary | ICD-10-CM

## 2012-08-17 NOTE — Telephone Encounter (Signed)
agree

## 2012-08-17 NOTE — Telephone Encounter (Signed)
  Procedure: egd/ed  Reason/Indication:  dysphagia  Has patient had this procedure before?  yes  If so, when, by whom and where?  2007  Is there a family history of colon cancer?    Who?  What age when diagnosed?    Is patient diabetic?         Does patient have prosthetic heart valve?  no  Do you have a pacemaker?  no  Has patient had joint replacement within last 12 months?  no  Is patient on Coumadin, Plavix and/or Aspirin? no  Medications: see EPIC  Allergies: see EPIC  Medication Adjustment: none  Procedure date & time: 08/26/12 at 200

## 2012-08-19 ENCOUNTER — Encounter (HOSPITAL_COMMUNITY): Payer: Self-pay | Admitting: Pharmacy Technician

## 2012-08-26 ENCOUNTER — Encounter (HOSPITAL_COMMUNITY)
Admission: RE | Disposition: A | Discharge status: Home / Self Care | Payer: Self-pay | Source: Ambulatory Visit | Attending: Internal Medicine

## 2012-08-26 ENCOUNTER — Encounter (HOSPITAL_COMMUNITY): Payer: Self-pay

## 2012-08-26 ENCOUNTER — Ambulatory Visit (HOSPITAL_COMMUNITY)
Admission: RE | Admit: 2012-08-26 | Discharge: 2012-08-26 | Disposition: A | Discharge status: Home / Self Care | Payer: Medicare Other | Source: Ambulatory Visit | Attending: Internal Medicine | Admitting: Internal Medicine

## 2012-08-26 DIAGNOSIS — Z538 Procedure and treatment not carried out for other reasons: Secondary | ICD-10-CM | POA: Insufficient documentation

## 2012-08-26 DIAGNOSIS — R1310 Dysphagia, unspecified: Secondary | ICD-10-CM | POA: Insufficient documentation

## 2012-08-26 SURGERY — EGD (ESOPHAGOGASTRODUODENOSCOPY)
Anesthesia: Moderate Sedation

## 2012-08-26 MED ORDER — SODIUM CHLORIDE 0.45 % IV SOLN
INTRAVENOUS | Status: DC
  Administered 2012-08-26: 1000 mL via INTRAVENOUS

## 2012-08-26 MED ORDER — MEPERIDINE HCL 50 MG/ML IJ SOLN
INTRAMUSCULAR | Status: AC
  Filled 2012-08-26: qty 1

## 2012-08-26 MED ORDER — MIDAZOLAM HCL 5 MG/5ML IJ SOLN
INTRAMUSCULAR | Status: AC
  Filled 2012-08-26: qty 10

## 2012-08-26 NOTE — Progress Notes (Signed)
Arrived at 44

## 2012-08-26 NOTE — Progress Notes (Signed)
Patient  scheduled to undergo EGD possible ED for dysphagia. However he denies dysphagia. He states that certain foods stick to his  palate and dental plate; he may have same problem with meats. However he has no difficulty swallowing liquids or solids. This symptom may be related to the fact that he's not making enough saliva. He does not need EGD for this symptom. If patient is interested will arrange for him to see speech pathologist for evaluation of oropharyngeal function.

## 2012-10-29 ENCOUNTER — Other Ambulatory Visit: Payer: Self-pay | Admitting: Family Medicine

## 2012-10-29 DIAGNOSIS — R5383 Other fatigue: Secondary | ICD-10-CM

## 2012-10-29 DIAGNOSIS — R972 Elevated prostate specific antigen [PSA]: Secondary | ICD-10-CM

## 2012-10-29 DIAGNOSIS — F17200 Nicotine dependence, unspecified, uncomplicated: Secondary | ICD-10-CM

## 2012-11-02 ENCOUNTER — Other Ambulatory Visit (HOSPITAL_COMMUNITY): Payer: Self-pay | Admitting: Family Medicine

## 2012-11-02 DIAGNOSIS — R5383 Other fatigue: Secondary | ICD-10-CM

## 2012-11-03 ENCOUNTER — Ambulatory Visit (HOSPITAL_COMMUNITY): Payer: Medicare Other | Attending: Cardiology

## 2012-11-03 DIAGNOSIS — R5381 Other malaise: Secondary | ICD-10-CM | POA: Insufficient documentation

## 2012-11-03 DIAGNOSIS — I359 Nonrheumatic aortic valve disorder, unspecified: Secondary | ICD-10-CM

## 2012-11-03 DIAGNOSIS — I059 Rheumatic mitral valve disease, unspecified: Secondary | ICD-10-CM

## 2012-11-03 DIAGNOSIS — E785 Hyperlipidemia, unspecified: Secondary | ICD-10-CM | POA: Insufficient documentation

## 2012-11-03 DIAGNOSIS — I10 Essential (primary) hypertension: Secondary | ICD-10-CM | POA: Insufficient documentation

## 2012-11-03 DIAGNOSIS — F17200 Nicotine dependence, unspecified, uncomplicated: Secondary | ICD-10-CM | POA: Insufficient documentation

## 2012-11-03 NOTE — Progress Notes (Signed)
Echocardiogram performed.  

## 2012-11-04 ENCOUNTER — Ambulatory Visit (HOSPITAL_COMMUNITY)
Admission: RE | Admit: 2012-11-04 | Discharge: 2012-11-04 | Disposition: A | Discharge status: Home / Self Care | Payer: Medicare Other | Source: Ambulatory Visit | Attending: Family Medicine | Admitting: Family Medicine

## 2012-11-04 ENCOUNTER — Telehealth: Payer: Self-pay | Admitting: Family Medicine

## 2012-11-04 DIAGNOSIS — R5381 Other malaise: Secondary | ICD-10-CM | POA: Insufficient documentation

## 2012-11-04 DIAGNOSIS — Z87891 Personal history of nicotine dependence: Secondary | ICD-10-CM | POA: Insufficient documentation

## 2012-11-04 DIAGNOSIS — F17200 Nicotine dependence, unspecified, uncomplicated: Secondary | ICD-10-CM

## 2012-11-04 DIAGNOSIS — J984 Other disorders of lung: Secondary | ICD-10-CM | POA: Insufficient documentation

## 2012-11-04 MED ORDER — FELODIPINE ER 10 MG PO TB24: 10.0000 mg | ORAL_TABLET | Freq: Every morning | ORAL | Status: DC

## 2012-11-04 MED ORDER — AMLODIPINE BESYLATE 10 MG PO TABS: 10.0000 mg | ORAL_TABLET | Freq: Every day | ORAL | Status: DC

## 2012-11-04 NOTE — Telephone Encounter (Signed)
rx refilled.

## 2012-11-13 ENCOUNTER — Telehealth: Payer: Self-pay | Admitting: Family Medicine

## 2012-11-13 NOTE — Telephone Encounter (Signed)
Ok to refill 

## 2012-11-14 MED ORDER — TRAMADOL HCL 50 MG PO TABS: 50.0000 mg | ORAL_TABLET | Freq: Four times a day (QID) | ORAL | Status: DC | PRN

## 2012-11-14 NOTE — Telephone Encounter (Signed)
Med refilled.

## 2012-11-23 ENCOUNTER — Encounter (HOSPITAL_COMMUNITY): Payer: Self-pay | Admitting: *Deleted

## 2012-11-23 ENCOUNTER — Inpatient Hospital Stay (HOSPITAL_COMMUNITY)
Admission: EM | Admit: 2012-11-23 | Discharge: 2012-11-25 | DRG: 087 | Disposition: A | Discharge status: Home Health | Payer: Medicare Other | Attending: General Surgery | Admitting: General Surgery

## 2012-11-23 ENCOUNTER — Emergency Department (HOSPITAL_COMMUNITY): Payer: Medicare Other

## 2012-11-23 DIAGNOSIS — J328 Other chronic sinusitis: Secondary | ICD-10-CM | POA: Diagnosis present

## 2012-11-23 DIAGNOSIS — M1710 Unilateral primary osteoarthritis, unspecified knee: Secondary | ICD-10-CM | POA: Diagnosis present

## 2012-11-23 DIAGNOSIS — S06330A Contusion and laceration of cerebrum, unspecified, without loss of consciousness, initial encounter: Secondary | ICD-10-CM

## 2012-11-23 DIAGNOSIS — J329 Chronic sinusitis, unspecified: Secondary | ICD-10-CM | POA: Diagnosis present

## 2012-11-23 DIAGNOSIS — Z8546 Personal history of malignant neoplasm of prostate: Secondary | ICD-10-CM

## 2012-11-23 DIAGNOSIS — W010XXA Fall on same level from slipping, tripping and stumbling without subsequent striking against object, initial encounter: Secondary | ICD-10-CM | POA: Diagnosis present

## 2012-11-23 DIAGNOSIS — E8881 Metabolic syndrome: Secondary | ICD-10-CM | POA: Diagnosis present

## 2012-11-23 DIAGNOSIS — I10 Essential (primary) hypertension: Secondary | ICD-10-CM | POA: Diagnosis present

## 2012-11-23 DIAGNOSIS — IMO0002 Reserved for concepts with insufficient information to code with codable children: Secondary | ICD-10-CM | POA: Diagnosis present

## 2012-11-23 DIAGNOSIS — S0280XA Fracture of other specified skull and facial bones, unspecified side, initial encounter for closed fracture: Secondary | ICD-10-CM | POA: Diagnosis present

## 2012-11-23 DIAGNOSIS — H1130 Conjunctival hemorrhage, unspecified eye: Secondary | ICD-10-CM | POA: Diagnosis present

## 2012-11-23 DIAGNOSIS — S0291XA Unspecified fracture of skull, initial encounter for closed fracture: Secondary | ICD-10-CM

## 2012-11-23 DIAGNOSIS — S069X9A Unspecified intracranial injury with loss of consciousness of unspecified duration, initial encounter: Secondary | ICD-10-CM

## 2012-11-23 DIAGNOSIS — R296 Repeated falls: Secondary | ICD-10-CM

## 2012-11-23 DIAGNOSIS — S02401A Maxillary fracture, unspecified, initial encounter for closed fracture: Secondary | ICD-10-CM

## 2012-11-23 DIAGNOSIS — N529 Male erectile dysfunction, unspecified: Secondary | ICD-10-CM | POA: Diagnosis present

## 2012-11-23 DIAGNOSIS — M109 Gout, unspecified: Secondary | ICD-10-CM | POA: Diagnosis present

## 2012-11-23 DIAGNOSIS — S0285XA Fracture of orbit, unspecified, initial encounter for closed fracture: Secondary | ICD-10-CM

## 2012-11-23 DIAGNOSIS — Z79899 Other long term (current) drug therapy: Secondary | ICD-10-CM

## 2012-11-23 DIAGNOSIS — S0990XA Unspecified injury of head, initial encounter: Secondary | ICD-10-CM

## 2012-11-23 DIAGNOSIS — S02109A Fracture of base of skull, unspecified side, initial encounter for closed fracture: Principal | ICD-10-CM | POA: Diagnosis present

## 2012-11-23 LAB — BASIC METABOLIC PANEL
Chloride: 99 mEq/L (ref 96–112)
Creatinine, Ser: 1.06 mg/dL (ref 0.50–1.35)
GFR calc Af Amer: 74 mL/min — ABNORMAL LOW (ref >90)
Potassium: 4.1 mEq/L (ref 3.5–5.1)
Sodium: 135 mEq/L (ref 135–145)

## 2012-11-23 LAB — CBC WITH DIFFERENTIAL/PLATELET
Basophils Absolute: 0 10*3/uL (ref 0.0–0.1)
HCT: 42.7 % (ref 39.0–52.0)
Lymphocytes Relative: 22 % (ref 12–46)
Monocytes Absolute: 0.4 10*3/uL (ref 0.1–1.0)
Neutro Abs: 6.4 10*3/uL (ref 1.7–7.7)
RBC: 4.77 MIL/uL (ref 4.22–5.81)
RDW: 13.5 % (ref 11.5–15.5)
WBC: 9.1 10*3/uL (ref 4.0–10.5)

## 2012-11-23 LAB — PROTIME-INR
INR: 1.07 (ref 0.00–1.49)
Prothrombin Time: 13.8 seconds (ref 11.6–15.2)

## 2012-11-23 LAB — MRSA PCR SCREENING: MRSA by PCR: NEGATIVE

## 2012-11-23 MED ORDER — MORPHINE SULFATE 4 MG/ML IJ SOLN: 4.0000 mg | INTRAMUSCULAR | Status: DC | PRN

## 2012-11-23 MED ORDER — PANTOPRAZOLE SODIUM 40 MG IV SOLR
40.0000 mg | Freq: Every day | INTRAVENOUS | Status: DC
  Administered 2012-11-23: 40 mg via INTRAVENOUS
  Filled 2012-11-23 (×3): qty 40

## 2012-11-23 MED ORDER — MORPHINE SULFATE 2 MG/ML IJ SOLN: 1.0000 mg | INTRAMUSCULAR | Status: DC | PRN

## 2012-11-23 MED ORDER — AMLODIPINE BESYLATE 10 MG PO TABS
10.0000 mg | ORAL_TABLET | Freq: Every morning | ORAL | Status: DC
  Administered 2012-11-24 – 2012-11-25 (×2): 10 mg via ORAL
  Filled 2012-11-23 (×2): qty 1

## 2012-11-23 MED ORDER — PANTOPRAZOLE SODIUM 40 MG PO TBEC
40.0000 mg | DELAYED_RELEASE_TABLET | Freq: Every day | ORAL | Status: DC
  Administered 2012-11-24: 40 mg via ORAL
  Filled 2012-11-23: qty 1

## 2012-11-23 MED ORDER — FUROSEMIDE 20 MG PO TABS
20.0000 mg | ORAL_TABLET | Freq: Every morning | ORAL | Status: DC
  Administered 2012-11-24 – 2012-11-25 (×2): 20 mg via ORAL
  Filled 2012-11-23 (×2): qty 1

## 2012-11-23 MED ORDER — ONDANSETRON HCL 4 MG PO TABS: 4.0000 mg | ORAL_TABLET | Freq: Four times a day (QID) | ORAL | Status: DC | PRN

## 2012-11-23 MED ORDER — METOPROLOL TARTRATE 100 MG PO TABS
100.0000 mg | ORAL_TABLET | Freq: Every morning | ORAL | Status: DC
  Administered 2012-11-24 – 2012-11-25 (×2): 100 mg via ORAL
  Filled 2012-11-23 (×2): qty 1

## 2012-11-23 MED ORDER — SODIUM CHLORIDE 0.9 % IV SOLN
INTRAVENOUS | Status: DC
  Administered 2012-11-23: 19:00:00 via INTRAVENOUS

## 2012-11-23 MED ORDER — MORPHINE SULFATE 2 MG/ML IJ SOLN
2.0000 mg | INTRAMUSCULAR | Status: DC | PRN
  Administered 2012-11-23: 2 mg via INTRAVENOUS
  Filled 2012-11-23: qty 1

## 2012-11-23 MED ORDER — ONDANSETRON HCL 4 MG/2ML IJ SOLN: 4.0000 mg | Freq: Four times a day (QID) | INTRAMUSCULAR | Status: DC | PRN

## 2012-11-23 NOTE — Consult Note (Signed)
Saagar, Tortorella [id number] 1928-10-11 Trauma Md, MD  Reason for Consult: left nondisplaced maxillary and orbital floor fractures s/p fall  HPI: 79yo who suffered left facial fractures in fall, ENT asked to consult. CT shows nondisplaced left orbital floor with no inferior rectus entrapment and nondisplaced left maxillary wall fractures with pansinusitis  Allergies: No Known Allergies  ROS: facial pain, otherwise negative x 10 systems except per HPI  PMH:  Past Medical History  Diagnosis Date  . Hypertension   . OA (osteoarthritis)     rt leg  . Metabolic syndrome   . ED (erectile dysfunction)   . Chronic fatigue   . Gout   . Bowing of leg   . Spinal stenosis   . Prediabetes   . Prostate cancer     FH: No family history on file.  SH:  History   Social History  . Marital Status: Divorced    Spouse Name: N/A    Number of Children: N/A  . Years of Education: N/A   Occupational History  . Not on file.   Social History Main Topics  . Smoking status: Never Smoker   . Smokeless tobacco: Not on file  . Alcohol Use: No  . Drug Use: No  . Sexually Active: Not on file   Other Topics Concern  . Not on file   Social History Narrative  . No narrative on file    PSH:  Past Surgical History  Procedure Laterality Date  . Back surgery x 2    . Rt rotator cuff repair      Physical  Exam: CN 2-12 grossly intact and symmetric. EAC/TMs normal BL. Oral cavity, lips, gums, ororpharynx normal with no masses or lesions. Skin warm and dry. Nasal cavity without polyps or purulence. External nose and ears without masses or lesions. EOMI, PERRLA. Has some left periorbital ecchymosis/edema and left scleral erythema but EOM are completely intact OU with no entrapment.   A/P: pansinusitis with nondisplaced letf orbital and maxillary fractures. The facial fractures are non-operative and can be monitored and will heal with observation. He should be treated with flonase, an oral steroid  taper, and oral antibiotics (can give IV while an inpatient) x 3 weeks. Clindamycin or levaquin or doxycycline would all be good choices for oral antibiotic therapy x 3 weeks. Follow up as needed.  Melvenia Beam 11/23/2012 10:32 PM

## 2012-11-23 NOTE — ED Notes (Signed)
Fall today hitting head on wood board with positive LOC for approx 2-3 minutes.  Friend reports heavy nose bleeding.  Bleeding controlled at this time.   Pt alert and oriented x 4 at this time.  C/o pain to head and left eye.  Moderate swelling to left eye with visual disturbances.

## 2012-11-23 NOTE — ED Notes (Signed)
Pt wanted to walk to bath room. Handed pt urinal but insisted I leave room. Pt stood to side of bed by himself without difficulty then i left room. Pt stable on feel.

## 2012-11-23 NOTE — H&P (Signed)
[name] is an 79 y.o. male.   Chief Complaint: Fall/ facial fracture HPI: 79 yo male s/p fall several hours ago, landing on the left side of his face.  + LOC for several minutes.  Amnestic for event.  The event was witnessed.  Patient initially complaining of pain around his left eye and headache, but now seems to be asymptomatic.  He was evaluated at [facility] and is now transferred to [facility] for the trauma service.    Past Medical History  Diagnosis Date  . Hypertension   . OA (osteoarthritis)     rt leg  . Metabolic syndrome   . ED (erectile dysfunction)   . Chronic fatigue   . Gout   . Bowing of leg   . Spinal stenosis   . Prediabetes   . Prostate cancer     Past Surgical History  Procedure Laterality Date  . Back surgery x 2    . Rt rotator cuff repair      No family history on file. Social History:  reports that he has never smoked. He does not have any smokeless tobacco history on file. He reports that he does not drink alcohol or use illicit drugs.  Allergies: No Known Allergies  Medications Prior to Admission  Medication Sig Dispense Refill  . amLODipine (NORVASC) 10 MG tablet Take 10 mg by mouth every morning.      . Cyanocobalamin (VITAMIN B12 PO) Take 1 tablet by mouth every morning.       . diclofenac sodium (VOLTAREN) 1 % GEL Apply 4 g topically 4 (four) times daily as needed. Pain      . fenofibrate (TRICOR) 145 MG tablet Take 145 mg by mouth daily.      . fish oil-omega-3 fatty acids 1000 MG capsule Take 2 g by mouth every morning.      . furosemide (LASIX) 20 MG tablet Take 20 mg by mouth every morning.       . Menthol-Methyl Salicylate (MUSCLE RUB) 10-15 % CREA Apply 1 application topically daily as needed (FOR PAIN).      Marland Kitchen metoprolol (LOPRESSOR) 100 MG tablet Take 100 mg by mouth every morning.       . traMADol (ULTRAM) 50 MG tablet Take 1 tablet (50 mg total) by mouth every 6 (six) hours as needed for pain. For pain  40 tablet  0  . sildenafil  (VIAGRA) 50 MG tablet Take 50 mg by mouth daily as needed.        Results for orders placed during the hospital encounter of 11/23/12 (from the past 48 hour(s))  PROTIME-INR     Status: None   Collection Time    11/23/12  1:29 PM      Result Value Range   Prothrombin Time 13.8  11.6 - 15.2 seconds   INR 1.07  0.00 - 1.49  BASIC METABOLIC PANEL     Status: Abnormal   Collection Time    11/23/12  1:29 PM      Result Value Range   Sodium 135  135 - 145 mEq/L   Potassium 4.1  3.5 - 5.1 mEq/L   Chloride 99  96 - 112 mEq/L   CO2 26  19 - 32 mEq/L   Glucose, Bld 159 (*) 70 - 99 mg/dL   BUN 17  6 - 23 mg/dL   Creatinine, Ser 1.61  0.50 - 1.35 mg/dL   Calcium 9.6  8.4 - 09.6 mg/dL   GFR  calc non Af Amer 64 (*) >90 mL/min   GFR calc Af Amer 74 (*) >90 mL/min   Comment:            The eGFR has been calculated     using the CKD EPI equation.     This calculation has not been     validated in all clinical     situations.     eGFR's persistently     <90 mL/min signify     possible Chronic Kidney Disease.  CBC WITH DIFFERENTIAL     Status: None   Collection Time    11/23/12  1:33 PM      Result Value Range   WBC 9.1  4.0 - 10.5 K/uL   RBC 4.77  4.22 - 5.81 MIL/uL   Hemoglobin 15.1  13.0 - 17.0 g/dL   HCT 47.8  29.5 - 62.1 %   MCV 89.5  78.0 - 100.0 fL   MCH 31.7  26.0 - 34.0 pg   MCHC 35.4  30.0 - 36.0 g/dL   RDW 30.8  65.7 - 84.6 %   Platelets 154  150 - 400 K/uL   Neutrophils Relative 71  43 - 77 %   Neutro Abs 6.4  1.7 - 7.7 K/uL   Lymphocytes Relative 22  12 - 46 %   Lymphs Abs 2.0  0.7 - 4.0 K/uL   Monocytes Relative 4  3 - 12 %   Monocytes Absolute 0.4  0.1 - 1.0 K/uL   Eosinophils Relative 3  0 - 5 %   Eosinophils Absolute 0.2  0.0 - 0.7 K/uL   Basophils Relative 0  0 - 1 %   Basophils Absolute 0.0  0.0 - 0.1 K/uL   Ct Head Wo Contrast  11/23/2012  *RADIOLOGY REPORT*  Clinical Data:  Head injury, fell down striking left side of face, left eye swelling and bruising,  loss of consciousness, nosebleed  CT HEAD WITHOUT CONTRAST CT MAXILLOFACIAL WITHOUT CONTRAST CT CERVICAL SPINE WITHOUT CONTRAST  Technique:  Multidetector CT imaging of the head, cervical spine, and maxillofacial structures were performed using the standard protocol without intravenous contrast. Multiplanar CT image reconstructions of the cervical spine and maxillofacial structures were also generated.  Comparison:  CT head 01/26/2008  CT HEAD  Findings: Mild generalized atrophy. Normal ventricular morphology. No midline shift or mass effect. Small foci of hemorrhagic contusion are identified in the frontal lobes bilaterally, largest in left frontal lobe 7 x 4 mm. Basal ganglia calcifications bilaterally and linear calcification in right cerebellar hemisphere unchanged. Few scattered dural calcifications without definite acute extra- axial fluid collection. No evidence of mass or acute infarction. Left periorbital hematoma with opacified sinuses, see below. No definite calvarial fracture.  IMPRESSION: Foci of hemorrhagic contusion within the frontal lobes bilaterally. Atrophy with small vessel chronic ischemic changes of deep cerebral white matter. No acute intracranial abnormalities. Facial abnormalities, see below.  CT MAXILLOFACIAL  Findings: Left periorbital and infraorbital contusion/hematoma. Intraorbital soft tissue planes clear. Small hemorrhagic contusion identified lateral right frontal lobe image 8. No definite extra-axial fluid collections. Opacified frontal maxillary sphenoid sinuses as well as bilateral ethmoid air cells. Nondisplaced fractures of anterior and lateral walls left maxillary sinus. Anterior wall fracture left maxillary sinus extends into the lateral margin of the left anterior nasal passages coronal image 28.  Minimally displaced fracture inferior wall left orbit. No extraocular muscle entrapment. Nasal septum midline. No additional facial bone fractures identified.  IMPRESSION:  Nondisplaced fractures of  the anterior and lateral walls of the left maxillary sinus with anterior fracture extending into the lateral margin of the left nasal passages anteriorly. Minimally displaced fracture inferior wall left orbit. Diffuse sinus opacification as above.  CT CERVICAL SPINE  Findings: Inhomogeneous thyroid lobes without discrete mass. Visualized skull base intact. Prior cervical spine fusion with intact plate and screws at C4-C5. Question additional bony fusion anteriorly at C5-C6. Multilevel disc space narrowing and endplate spur formation. Multilevel facet degenerative changes. Prevertebral soft tissues normal thickness. Lung apices clear. No definite acute cervical spine fracture or subluxation identified.  IMPRESSION: Multilevel degenerative disc and facet disease changes of the cervical spine. Prior anterior cervical spine fusion C4-C5 and questionably C5-C6. No acute cervical spine abnormalities.   Original Report Authenticated By: Ulyses Southward, M.D.    Ct Cervical Spine Wo Contrast  11/23/2012  *RADIOLOGY REPORT*  Clinical Data:  Head injury, fell down striking left side of face, left eye swelling and bruising, loss of consciousness, nosebleed  CT HEAD WITHOUT CONTRAST CT MAXILLOFACIAL WITHOUT CONTRAST CT CERVICAL SPINE WITHOUT CONTRAST  Technique:  Multidetector CT imaging of the head, cervical spine, and maxillofacial structures were performed using the standard protocol without intravenous contrast. Multiplanar CT image reconstructions of the cervical spine and maxillofacial structures were also generated.  Comparison:  CT head 01/26/2008  CT HEAD  Findings: Mild generalized atrophy. Normal ventricular morphology. No midline shift or mass effect. Small foci of hemorrhagic contusion are identified in the frontal lobes bilaterally, largest in left frontal lobe 7 x 4 mm. Basal ganglia calcifications bilaterally and linear calcification in right cerebellar hemisphere unchanged. Few scattered  dural calcifications without definite acute extra- axial fluid collection. No evidence of mass or acute infarction. Left periorbital hematoma with opacified sinuses, see below. No definite calvarial fracture.  IMPRESSION: Foci of hemorrhagic contusion within the frontal lobes bilaterally. Atrophy with small vessel chronic ischemic changes of deep cerebral white matter. No acute intracranial abnormalities. Facial abnormalities, see below.  CT MAXILLOFACIAL  Findings: Left periorbital and infraorbital contusion/hematoma. Intraorbital soft tissue planes clear. Small hemorrhagic contusion identified lateral right frontal lobe image 8. No definite extra-axial fluid collections. Opacified frontal maxillary sphenoid sinuses as well as bilateral ethmoid air cells. Nondisplaced fractures of anterior and lateral walls left maxillary sinus. Anterior wall fracture left maxillary sinus extends into the lateral margin of the left anterior nasal passages coronal image 28.  Minimally displaced fracture inferior wall left orbit. No extraocular muscle entrapment. Nasal septum midline. No additional facial bone fractures identified.  IMPRESSION: Nondisplaced fractures of the anterior and lateral walls of the left maxillary sinus with anterior fracture extending into the lateral margin of the left nasal passages anteriorly. Minimally displaced fracture inferior wall left orbit. Diffuse sinus opacification as above.  CT CERVICAL SPINE  Findings: Inhomogeneous thyroid lobes without discrete mass. Visualized skull base intact. Prior cervical spine fusion with intact plate and screws at C4-C5. Question additional bony fusion anteriorly at C5-C6. Multilevel disc space narrowing and endplate spur formation. Multilevel facet degenerative changes. Prevertebral soft tissues normal thickness. Lung apices clear. No definite acute cervical spine fracture or subluxation identified.  IMPRESSION: Multilevel degenerative disc and facet disease changes  of the cervical spine. Prior anterior cervical spine fusion C4-C5 and questionably C5-C6. No acute cervical spine abnormalities.   Original Report Authenticated By: Ulyses Southward, M.D.    Ct Maxillofacial Wo Cm  11/23/2012  *RADIOLOGY REPORT*  Clinical Data:  Head injury, fell down striking left side of face, left eye  swelling and bruising, loss of consciousness, nosebleed  CT HEAD WITHOUT CONTRAST CT MAXILLOFACIAL WITHOUT CONTRAST CT CERVICAL SPINE WITHOUT CONTRAST  Technique:  Multidetector CT imaging of the head, cervical spine, and maxillofacial structures were performed using the standard protocol without intravenous contrast. Multiplanar CT image reconstructions of the cervical spine and maxillofacial structures were also generated.  Comparison:  CT head 01/26/2008  CT HEAD  Findings: Mild generalized atrophy. Normal ventricular morphology. No midline shift or mass effect. Small foci of hemorrhagic contusion are identified in the frontal lobes bilaterally, largest in left frontal lobe 7 x 4 mm. Basal ganglia calcifications bilaterally and linear calcification in right cerebellar hemisphere unchanged. Few scattered dural calcifications without definite acute extra- axial fluid collection. No evidence of mass or acute infarction. Left periorbital hematoma with opacified sinuses, see below. No definite calvarial fracture.  IMPRESSION: Foci of hemorrhagic contusion within the frontal lobes bilaterally. Atrophy with small vessel chronic ischemic changes of deep cerebral white matter. No acute intracranial abnormalities. Facial abnormalities, see below.  CT MAXILLOFACIAL  Findings: Left periorbital and infraorbital contusion/hematoma. Intraorbital soft tissue planes clear. Small hemorrhagic contusion identified lateral right frontal lobe image 8. No definite extra-axial fluid collections. Opacified frontal maxillary sphenoid sinuses as well as bilateral ethmoid air cells. Nondisplaced fractures of anterior and  lateral walls left maxillary sinus. Anterior wall fracture left maxillary sinus extends into the lateral margin of the left anterior nasal passages coronal image 28.  Minimally displaced fracture inferior wall left orbit. No extraocular muscle entrapment. Nasal septum midline. No additional facial bone fractures identified.  IMPRESSION: Nondisplaced fractures of the anterior and lateral walls of the left maxillary sinus with anterior fracture extending into the lateral margin of the left nasal passages anteriorly. Minimally displaced fracture inferior wall left orbit. Diffuse sinus opacification as above.  CT CERVICAL SPINE  Findings: Inhomogeneous thyroid lobes without discrete mass. Visualized skull base intact. Prior cervical spine fusion with intact plate and screws at C4-C5. Question additional bony fusion anteriorly at C5-C6. Multilevel disc space narrowing and endplate spur formation. Multilevel facet degenerative changes. Prevertebral soft tissues normal thickness. Lung apices clear. No definite acute cervical spine fracture or subluxation identified.  IMPRESSION: Multilevel degenerative disc and facet disease changes of the cervical spine. Prior anterior cervical spine fusion C4-C5 and questionably C5-C6. No acute cervical spine abnormalities.   Original Report Authenticated By: Ulyses Southward, M.D.     Review of Systems  Eyes: Positive for pain and redness.  Neurological: Positive for headaches.  Endo/Heme/Allergies: Negative.     Blood pressure 158/62, pulse 58, temperature 98 F (36.7 C), temperature source Oral, resp. rate 16, height 5\' 10"  (1.778 m), weight 213 lb 3 oz (96.7 kg), SpO2 100.00%. Physical Exam  Elderly male in NAD HEENT - left periorbital ecchymosis/ edema Left eye - some conjunctival hemorrhage EOMI, PERRL Neck - non-tender Chest - non-tender Lungs - CTA B CV - RRR/ no murmur Abd - soft, non-tender; no guarding Neuro - awake, alert, oriented FROM x 4; normal  strength  Assessment/Plan Fall with LOC Traumatic brain injury with bilateral frontal contusions Left maxillary sinus fractures - anterior, lateral walls extending into the lateral margin of the left nasal passage Left inferior orbital wall fracture  Plan:  Neuro ICU overnight Repeat head CT in AM Neurosurgery consult Face consult    Edrees Valent K. 11/23/2012, 6:38 PM

## 2012-11-23 NOTE — Consult Note (Signed)
Reason for Consult: Small cerebral contusions Referring Physician: Dr. Lorrin Mais [name] is an 79 y.o. male.  HPI: The patient is a 79 year old white male who by report fell earlier today. There is a reported loss of consciousness but the patient does not recall the incident. The patient was brought to the emergency department where a head CT was obtained and demonstrated small cerebral contusions. The patient has been evaluated by the trauma service and admitted. Dr. Margaree Mackintosh has requested a neurosurgical consultation.  Presently the patient is alert and pleasant. He denies headache, nausea, vomiting, seizures, numbness, tingling, weakness, neck pain, back pain, etc. by report the patient is not on any anticoagulants.  Past Medical History  Diagnosis Date  . Hypertension   . OA (osteoarthritis)     rt leg  . Metabolic syndrome   . ED (erectile dysfunction)   . Chronic fatigue   . Gout   . Bowing of leg   . Spinal stenosis   . Prediabetes   . Prostate cancer     Past Surgical History  Procedure Laterality Date  . Back surgery x 2    . Rt rotator cuff repair      No family history on file.  Social History:  reports that he has never smoked. He does not have any smokeless tobacco history on file. He reports that he does not drink alcohol or use illicit drugs.  Allergies: No Known Allergies  Medications:  I have reviewed the patient's current medications. Prior to Admission:  Prescriptions prior to admission  Medication Sig Dispense Refill  . amLODipine (NORVASC) 10 MG tablet Take 10 mg by mouth every morning.      . Cyanocobalamin (VITAMIN B12 PO) Take 1 tablet by mouth every morning.       . diclofenac sodium (VOLTAREN) 1 % GEL Apply 4 g topically 4 (four) times daily as needed. Pain      . fenofibrate (TRICOR) 145 MG tablet Take 145 mg by mouth daily.      . fish oil-omega-3 fatty acids 1000 MG capsule Take 2 g by mouth every morning.      . furosemide (LASIX) 20 MG  tablet Take 20 mg by mouth every morning.       . Menthol-Methyl Salicylate (MUSCLE RUB) 10-15 % CREA Apply 1 application topically daily as needed (FOR PAIN).      Marland Kitchen metoprolol (LOPRESSOR) 100 MG tablet Take 100 mg by mouth every morning.       . traMADol (ULTRAM) 50 MG tablet Take 1 tablet (50 mg total) by mouth every 6 (six) hours as needed for pain. For pain  40 tablet  0  . sildenafil (VIAGRA) 50 MG tablet Take 50 mg by mouth daily as needed.       Scheduled: . [START ON 11/24/2012] amLODipine  10 mg Oral q morning - 10a  . [START ON 11/24/2012] furosemide  20 mg Oral q morning - 10a  . [START ON 11/24/2012] metoprolol  100 mg Oral q morning - 10a  . pantoprazole  40 mg Oral Daily   Or  . pantoprazole (PROTONIX) IV  40 mg Intravenous Daily   Continuous: . sodium chloride 75 mL/hr at 11/23/12 2000   EXB:MWUXLKGM injection, morphine injection, morphine injection, ondansetron (ZOFRAN) IV, ondansetron Anti-infectives   None       Results for orders placed during the hospital encounter of 11/23/12 (from the past 48 hour(s))  PROTIME-INR     Status: None  Collection Time    11/23/12  1:29 PM      Result Value Range   Prothrombin Time 13.8  11.6 - 15.2 seconds   INR 1.07  0.00 - 1.49  BASIC METABOLIC PANEL     Status: Abnormal   Collection Time    11/23/12  1:29 PM      Result Value Range   Sodium 135  135 - 145 mEq/L   Potassium 4.1  3.5 - 5.1 mEq/L   Chloride 99  96 - 112 mEq/L   CO2 26  19 - 32 mEq/L   Glucose, Bld 159 (*) 70 - 99 mg/dL   BUN 17  6 - 23 mg/dL   Creatinine, Ser 4.09  0.50 - 1.35 mg/dL   Calcium 9.6  8.4 - 81.1 mg/dL   GFR calc non Af Amer 64 (*) >90 mL/min   GFR calc Af Amer 74 (*) >90 mL/min   Comment:            The eGFR has been calculated     using the CKD EPI equation.     This calculation has not been     validated in all clinical     situations.     eGFR's persistently     <90 mL/min signify     possible Chronic Kidney Disease.  CBC WITH  DIFFERENTIAL     Status: None   Collection Time    11/23/12  1:33 PM      Result Value Range   WBC 9.1  4.0 - 10.5 K/uL   RBC 4.77  4.22 - 5.81 MIL/uL   Hemoglobin 15.1  13.0 - 17.0 g/dL   HCT 91.4  78.2 - 95.6 %   MCV 89.5  78.0 - 100.0 fL   MCH 31.7  26.0 - 34.0 pg   MCHC 35.4  30.0 - 36.0 g/dL   RDW 21.3  08.6 - 57.8 %   Platelets 154  150 - 400 K/uL   Neutrophils Relative 71  43 - 77 %   Neutro Abs 6.4  1.7 - 7.7 K/uL   Lymphocytes Relative 22  12 - 46 %   Lymphs Abs 2.0  0.7 - 4.0 K/uL   Monocytes Relative 4  3 - 12 %   Monocytes Absolute 0.4  0.1 - 1.0 K/uL   Eosinophils Relative 3  0 - 5 %   Eosinophils Absolute 0.2  0.0 - 0.7 K/uL   Basophils Relative 0  0 - 1 %   Basophils Absolute 0.0  0.0 - 0.1 K/uL    Ct Head Wo Contrast  11/23/2012  *RADIOLOGY REPORT*  Clinical Data:  Head injury, fell down striking left side of face, left eye swelling and bruising, loss of consciousness, nosebleed  CT HEAD WITHOUT CONTRAST CT MAXILLOFACIAL WITHOUT CONTRAST CT CERVICAL SPINE WITHOUT CONTRAST  Technique:  Multidetector CT imaging of the head, cervical spine, and maxillofacial structures were performed using the standard protocol without intravenous contrast. Multiplanar CT image reconstructions of the cervical spine and maxillofacial structures were also generated.  Comparison:  CT head 01/26/2008  CT HEAD  Findings: Mild generalized atrophy. Normal ventricular morphology. No midline shift or mass effect. Small foci of hemorrhagic contusion are identified in the frontal lobes bilaterally, largest in left frontal lobe 7 x 4 mm. Basal ganglia calcifications bilaterally and linear calcification in right cerebellar hemisphere unchanged. Few scattered dural calcifications without definite acute extra- axial fluid collection. No evidence of mass or acute infarction.  Left periorbital hematoma with opacified sinuses, see below. No definite calvarial fracture.  IMPRESSION: Foci of hemorrhagic contusion  within the frontal lobes bilaterally. Atrophy with small vessel chronic ischemic changes of deep cerebral white matter. No acute intracranial abnormalities. Facial abnormalities, see below.  CT MAXILLOFACIAL  Findings: Left periorbital and infraorbital contusion/hematoma. Intraorbital soft tissue planes clear. Small hemorrhagic contusion identified lateral right frontal lobe image 8. No definite extra-axial fluid collections. Opacified frontal maxillary sphenoid sinuses as well as bilateral ethmoid air cells. Nondisplaced fractures of anterior and lateral walls left maxillary sinus. Anterior wall fracture left maxillary sinus extends into the lateral margin of the left anterior nasal passages coronal image 28.  Minimally displaced fracture inferior wall left orbit. No extraocular muscle entrapment. Nasal septum midline. No additional facial bone fractures identified.  IMPRESSION: Nondisplaced fractures of the anterior and lateral walls of the left maxillary sinus with anterior fracture extending into the lateral margin of the left nasal passages anteriorly. Minimally displaced fracture inferior wall left orbit. Diffuse sinus opacification as above.  CT CERVICAL SPINE  Findings: Inhomogeneous thyroid lobes without discrete mass. Visualized skull base intact. Prior cervical spine fusion with intact plate and screws at C4-C5. Question additional bony fusion anteriorly at C5-C6. Multilevel disc space narrowing and endplate spur formation. Multilevel facet degenerative changes. Prevertebral soft tissues normal thickness. Lung apices clear. No definite acute cervical spine fracture or subluxation identified.  IMPRESSION: Multilevel degenerative disc and facet disease changes of the cervical spine. Prior anterior cervical spine fusion C4-C5 and questionably C5-C6. No acute cervical spine abnormalities.   Original Report Authenticated By: Ulyses Southward, M.D.    Ct Cervical Spine Wo Contrast  11/23/2012  *RADIOLOGY REPORT*   Clinical Data:  Head injury, fell down striking left side of face, left eye swelling and bruising, loss of consciousness, nosebleed  CT HEAD WITHOUT CONTRAST CT MAXILLOFACIAL WITHOUT CONTRAST CT CERVICAL SPINE WITHOUT CONTRAST  Technique:  Multidetector CT imaging of the head, cervical spine, and maxillofacial structures were performed using the standard protocol without intravenous contrast. Multiplanar CT image reconstructions of the cervical spine and maxillofacial structures were also generated.  Comparison:  CT head 01/26/2008  CT HEAD  Findings: Mild generalized atrophy. Normal ventricular morphology. No midline shift or mass effect. Small foci of hemorrhagic contusion are identified in the frontal lobes bilaterally, largest in left frontal lobe 7 x 4 mm. Basal ganglia calcifications bilaterally and linear calcification in right cerebellar hemisphere unchanged. Few scattered dural calcifications without definite acute extra- axial fluid collection. No evidence of mass or acute infarction. Left periorbital hematoma with opacified sinuses, see below. No definite calvarial fracture.  IMPRESSION: Foci of hemorrhagic contusion within the frontal lobes bilaterally. Atrophy with small vessel chronic ischemic changes of deep cerebral white matter. No acute intracranial abnormalities. Facial abnormalities, see below.  CT MAXILLOFACIAL  Findings: Left periorbital and infraorbital contusion/hematoma. Intraorbital soft tissue planes clear. Small hemorrhagic contusion identified lateral right frontal lobe image 8. No definite extra-axial fluid collections. Opacified frontal maxillary sphenoid sinuses as well as bilateral ethmoid air cells. Nondisplaced fractures of anterior and lateral walls left maxillary sinus. Anterior wall fracture left maxillary sinus extends into the lateral margin of the left anterior nasal passages coronal image 28.  Minimally displaced fracture inferior wall left orbit. No extraocular muscle  entrapment. Nasal septum midline. No additional facial bone fractures identified.  IMPRESSION: Nondisplaced fractures of the anterior and lateral walls of the left maxillary sinus with anterior fracture extending into the lateral margin of the  left nasal passages anteriorly. Minimally displaced fracture inferior wall left orbit. Diffuse sinus opacification as above.  CT CERVICAL SPINE  Findings: Inhomogeneous thyroid lobes without discrete mass. Visualized skull base intact. Prior cervical spine fusion with intact plate and screws at C4-C5. Question additional bony fusion anteriorly at C5-C6. Multilevel disc space narrowing and endplate spur formation. Multilevel facet degenerative changes. Prevertebral soft tissues normal thickness. Lung apices clear. No definite acute cervical spine fracture or subluxation identified.  IMPRESSION: Multilevel degenerative disc and facet disease changes of the cervical spine. Prior anterior cervical spine fusion C4-C5 and questionably C5-C6. No acute cervical spine abnormalities.   Original Report Authenticated By: Ulyses Southward, M.D.    Ct Maxillofacial Wo Cm  11/23/2012  *RADIOLOGY REPORT*  Clinical Data:  Head injury, fell down striking left side of face, left eye swelling and bruising, loss of consciousness, nosebleed  CT HEAD WITHOUT CONTRAST CT MAXILLOFACIAL WITHOUT CONTRAST CT CERVICAL SPINE WITHOUT CONTRAST  Technique:  Multidetector CT imaging of the head, cervical spine, and maxillofacial structures were performed using the standard protocol without intravenous contrast. Multiplanar CT image reconstructions of the cervical spine and maxillofacial structures were also generated.  Comparison:  CT head 01/26/2008  CT HEAD  Findings: Mild generalized atrophy. Normal ventricular morphology. No midline shift or mass effect. Small foci of hemorrhagic contusion are identified in the frontal lobes bilaterally, largest in left frontal lobe 7 x 4 mm. Basal ganglia calcifications  bilaterally and linear calcification in right cerebellar hemisphere unchanged. Few scattered dural calcifications without definite acute extra- axial fluid collection. No evidence of mass or acute infarction. Left periorbital hematoma with opacified sinuses, see below. No definite calvarial fracture.  IMPRESSION: Foci of hemorrhagic contusion within the frontal lobes bilaterally. Atrophy with small vessel chronic ischemic changes of deep cerebral white matter. No acute intracranial abnormalities. Facial abnormalities, see below.  CT MAXILLOFACIAL  Findings: Left periorbital and infraorbital contusion/hematoma. Intraorbital soft tissue planes clear. Small hemorrhagic contusion identified lateral right frontal lobe image 8. No definite extra-axial fluid collections. Opacified frontal maxillary sphenoid sinuses as well as bilateral ethmoid air cells. Nondisplaced fractures of anterior and lateral walls left maxillary sinus. Anterior wall fracture left maxillary sinus extends into the lateral margin of the left anterior nasal passages coronal image 28.  Minimally displaced fracture inferior wall left orbit. No extraocular muscle entrapment. Nasal septum midline. No additional facial bone fractures identified.  IMPRESSION: Nondisplaced fractures of the anterior and lateral walls of the left maxillary sinus with anterior fracture extending into the lateral margin of the left nasal passages anteriorly. Minimally displaced fracture inferior wall left orbit. Diffuse sinus opacification as above.  CT CERVICAL SPINE  Findings: Inhomogeneous thyroid lobes without discrete mass. Visualized skull base intact. Prior cervical spine fusion with intact plate and screws at C4-C5. Question additional bony fusion anteriorly at C5-C6. Multilevel disc space narrowing and endplate spur formation. Multilevel facet degenerative changes. Prevertebral soft tissues normal thickness. Lung apices clear. No definite acute cervical spine fracture  or subluxation identified.  IMPRESSION: Multilevel degenerative disc and facet disease changes of the cervical spine. Prior anterior cervical spine fusion C4-C5 and questionably C5-C6. No acute cervical spine abnormalities.   Original Report Authenticated By: Ulyses Southward, M.D.     Review of systems: Negative except as above Blood pressure 151/60, pulse 57, temperature 98.3 F (36.8 C), temperature source Oral, resp. rate 14, height 5\' 10"  (1.778 m), weight 96.7 kg (213 lb 3 oz), SpO2 99.00%. Physical exam:  Gen.: A pleasant 79 year old  white male with some left facial swelling in no apparent distress  HEENT: The patient has superficial abrasions on his for head and left face. He has some left periorbital swelling. His pupils are equal. Extraocular muscles intact. There is no evidence of CSF otorrhea or rhinorrhea. I don't see any battle signs.  Neck: Supple without masses, deformities, tracheal deviation. He has a very limited cervical range of motion. There is no tenderness. Spurling testing is negative. Lhermitte sign was not present.  Thorax: Symmetric  Abdomen: Soft  Extremities: No obvious deformities  Back exam: Unremarkable  Neurologic exam: The patient is alert and oriented x3. Glasgow Coma Scale 15. Cranial nerves II through XII were examined bilaterally and grossly normal. Vision and hearing are grossly normal bilaterally. Motor strength is 5 over 5 his bilateral biceps, triceps, hand grip, quadriceps, gastrocnemius. Sensory function was intact to light touch sensation all tested dermatomes. Cerebellar function was intact to rapid alternating movements of the upper extremities bilaterally. Deep tendon reflexes were symmetric. There is no ankle clonus.  I reviewed the patient's head CT performed without contrast at [facility] today. It demonstrates a small frontal cerebral contusions without significant mass effect. He has a small right subdural hygroma.  I have also  reviewed the patient's cervical CT performed without contrast at [facility] today. It demonstrates the patient has a C4-5 anterior cervical discectomy, fusion, and plating. He has quite a bit of spondylosis and a general change. I don't see any acute findings.  Assessment/Plan: Cerebral contusions: I have discussed situation with the patient. He has been admitted for observation. We will plan to repeat his CAT scan tomorrow. If there is no significant change the patient can be discharged and followup with me on a when necessary basis.  [name] D 11/23/2012, 8:03 PM

## 2012-11-23 NOTE — ED Provider Notes (Signed)
History  This chart was scribed for Fred Racer, MD by Shari Heritage, ED Scribe. The patient was seen in room APA03/APA03. Patient's care was started at 1309.   CSN: [id number]  Arrival date & time 11/23/12  1212   First MD Initiated Contact with Patient 11/23/12 1309      Chief Complaint  Patient presents with  . Head Injury     Patient is a 79 y.o. male presenting with head injury. The history is provided by the patient and a friend. No language interpreter was used.  Head Injury Mechanism of injury: fall   Pain details:    Quality:  Dull   Severity:  Moderate   Timing:  Constant Chronicity:  New Ineffective treatments:  None tried Associated symptoms: headache, loss of consciousness and neck pain   Associated symptoms: no difficulty breathing, no disorientation, no double vision, no memory loss, no nausea, no numbness, no seizures, no tinnitus and no vomiting   Loss of consciousness:    Duration:  3 minutes    HPI Comments: [name] is a 79 y.o. male who presents to the Emergency Department complaining of a witnessed trip and fall that occurred immediately prior to arrival.  Patient's friend reports loss of consciousness for 2-3 minutes after patient his his head on wood board upon falling. Patient is complaining of right lateral neck pain, diffuse headache and pain around the right eye. He denies confusion, disorientation, abdominal pain, back pain, nausea, vomiting or other symptoms at this time. He has a medical history of prostate cancer and hypertension. Patient has never smoked and does not use alcohol.   Past Medical History  Diagnosis Date  . Hypertension   . OA (osteoarthritis)     rt leg  . Metabolic syndrome   . ED (erectile dysfunction)   . Chronic fatigue   . Gout   . Bowing of leg   . Spinal stenosis   . Prediabetes   . Prostate cancer     Past Surgical History  Procedure Laterality Date  . Back surgery x 2    . Rt rotator cuff repair       No family history on file.  History  Substance Use Topics  . Smoking status: Never Smoker   . Smokeless tobacco: Not on file  . Alcohol Use: No      Review of Systems  Constitutional: Negative for fever and chills.  HENT: Positive for neck pain. Negative for congestion, sore throat, rhinorrhea and tinnitus.   Eyes: Positive for pain. Negative for double vision.  Respiratory: Negative for cough and shortness of breath.   Cardiovascular: Negative for chest pain.  Gastrointestinal: Negative for nausea, vomiting and abdominal pain.  Musculoskeletal: Negative for back pain.  Skin: Negative for rash.  Neurological: Positive for loss of consciousness and headaches. Negative for seizures and numbness.  Psychiatric/Behavioral: Negative for memory loss.  All other systems reviewed and are negative.    Allergies  Review of patient's allergies indicates no known allergies.  Home Medications   Current Outpatient Rx  Name  Route  Sig  Dispense  Refill  . amLODipine (NORVASC) 10 MG tablet   Oral   Take 10 mg by mouth every morning.         . Cyanocobalamin (VITAMIN B12 PO)   Oral   Take 1 tablet by mouth every morning.          . diclofenac sodium (VOLTAREN) 1 % GEL   Topical  Apply 4 g topically 4 (four) times daily as needed. Pain         . fenofibrate (TRICOR) 145 MG tablet   Oral   Take 145 mg by mouth daily.         . fish oil-omega-3 fatty acids 1000 MG capsule   Oral   Take 2 g by mouth every morning.         . furosemide (LASIX) 20 MG tablet   Oral   Take 20 mg by mouth every morning.          . Menthol-Methyl Salicylate (MUSCLE RUB) 10-15 % CREA   Topical   Apply 1 application topically daily as needed (FOR PAIN).         Marland Kitchen metoprolol (LOPRESSOR) 100 MG tablet   Oral   Take 100 mg by mouth every morning.          . traMADol (ULTRAM) 50 MG tablet   Oral   Take 1 tablet (50 mg total) by mouth every 6 (six) hours as needed for pain. For  pain   40 tablet   0   . sildenafil (VIAGRA) 50 MG tablet   Oral   Take 50 mg by mouth daily as needed.           Triage Vitals: BP 162/57  Pulse 55  Resp 20  Ht 5\' 10"  (1.778 m)  Wt 220 lb (99.791 kg)  BMI 31.57 kg/m2  SpO2 98%  Physical Exam  Constitutional: He is oriented to person, place, and time. He appears well-developed and well-nourished.  HENT:  Head: Normocephalic. Head is with abrasion.  Abrasion to left forehead.  Eyes: Conjunctivae and EOM are normal. Pupils are equal, round, and reactive to light.  Left periorbital contusion.  Neck: Normal range of motion. Neck supple.  Cardiovascular: Normal rate, regular rhythm and normal heart sounds.   Pulmonary/Chest: Effort normal and breath sounds normal. No respiratory distress. He has no wheezes. He has no rales.  Abdominal: Soft. He exhibits no distension and no mass. There is no tenderness. There is no rebound and no guarding.  Musculoskeletal:   No cervical midline tenderness. Minor abrasion to distal anterior thigh.  Neurological: He is alert and oriented to person, place, and time.  5/5 strength in all extremities.   Skin: Skin is warm.    ED Course  Procedures (including critical care time) DIAGNOSTIC STUDIES: Oxygen Saturation is 98% on room air, normal by my interpretation.    COORDINATION OF CARE: 1:13 PM- Patient informed of current plan for treatment and evaluation and agrees with plan at this time.   2:22 PM- Discussed results of patient's CT scans and plans to transfer to [facility] for further treatment. Have paged trauma surgery to arrange patient's transfer.  2:38 PM- Spoke with Dr. Janee Morn of trauma service who has agreed to direct transfer to [id number].     Labs Reviewed  BASIC METABOLIC PANEL - Abnormal; Notable for the following:    Glucose, Bld 159 (*)    GFR calc non Af Amer 64 (*)    GFR calc Af Amer 74 (*)    All other components within normal limits  PROTIME-INR  CBC WITH  DIFFERENTIAL    Ct Head Wo Contrast  11/23/2012  *RADIOLOGY REPORT*  Clinical Data:  Head injury, fell down striking left side of face, left eye swelling and bruising, loss of consciousness, nosebleed  CT HEAD WITHOUT CONTRAST CT MAXILLOFACIAL WITHOUT CONTRAST CT CERVICAL SPINE WITHOUT  CONTRAST  Technique:  Multidetector CT imaging of the head, cervical spine, and maxillofacial structures were performed using the standard protocol without intravenous contrast. Multiplanar CT image reconstructions of the cervical spine and maxillofacial structures were also generated.  Comparison:  CT head 01/26/2008  CT HEAD  Findings: Mild generalized atrophy. Normal ventricular morphology. No midline shift or mass effect. Small foci of hemorrhagic contusion are identified in the frontal lobes bilaterally, largest in left frontal lobe 7 x 4 mm. Basal ganglia calcifications bilaterally and linear calcification in right cerebellar hemisphere unchanged. Few scattered dural calcifications without definite acute extra- axial fluid collection. No evidence of mass or acute infarction. Left periorbital hematoma with opacified sinuses, see below. No definite calvarial fracture.  IMPRESSION: Foci of hemorrhagic contusion within the frontal lobes bilaterally. Atrophy with small vessel chronic ischemic changes of deep cerebral white matter. No acute intracranial abnormalities. Facial abnormalities, see below.  CT MAXILLOFACIAL  Findings: Left periorbital and infraorbital contusion/hematoma. Intraorbital soft tissue planes clear. Small hemorrhagic contusion identified lateral right frontal lobe image 8. No definite extra-axial fluid collections. Opacified frontal maxillary sphenoid sinuses as well as bilateral ethmoid air cells. Nondisplaced fractures of anterior and lateral walls left maxillary sinus. Anterior wall fracture left maxillary sinus extends into the lateral margin of the left anterior nasal passages coronal image 28.  Minimally  displaced fracture inferior wall left orbit. No extraocular muscle entrapment. Nasal septum midline. No additional facial bone fractures identified.  IMPRESSION: Nondisplaced fractures of the anterior and lateral walls of the left maxillary sinus with anterior fracture extending into the lateral margin of the left nasal passages anteriorly. Minimally displaced fracture inferior wall left orbit. Diffuse sinus opacification as above.  CT CERVICAL SPINE  Findings: Inhomogeneous thyroid lobes without discrete mass. Visualized skull base intact. Prior cervical spine fusion with intact plate and screws at C4-C5. Question additional bony fusion anteriorly at C5-C6. Multilevel disc space narrowing and endplate spur formation. Multilevel facet degenerative changes. Prevertebral soft tissues normal thickness. Lung apices clear. No definite acute cervical spine fracture or subluxation identified.  IMPRESSION: Multilevel degenerative disc and facet disease changes of the cervical spine. Prior anterior cervical spine fusion C4-C5 and questionably C5-C6. No acute cervical spine abnormalities.   Original Report Authenticated By: Ulyses Southward, M.D.    Ct Cervical Spine Wo Contrast  11/23/2012  *RADIOLOGY REPORT*  Clinical Data:  Head injury, fell down striking left side of face, left eye swelling and bruising, loss of consciousness, nosebleed  CT HEAD WITHOUT CONTRAST CT MAXILLOFACIAL WITHOUT CONTRAST CT CERVICAL SPINE WITHOUT CONTRAST  Technique:  Multidetector CT imaging of the head, cervical spine, and maxillofacial structures were performed using the standard protocol without intravenous contrast. Multiplanar CT image reconstructions of the cervical spine and maxillofacial structures were also generated.  Comparison:  CT head 01/26/2008  CT HEAD  Findings: Mild generalized atrophy. Normal ventricular morphology. No midline shift or mass effect. Small foci of hemorrhagic contusion are identified in the frontal lobes  bilaterally, largest in left frontal lobe 7 x 4 mm. Basal ganglia calcifications bilaterally and linear calcification in right cerebellar hemisphere unchanged. Few scattered dural calcifications without definite acute extra- axial fluid collection. No evidence of mass or acute infarction. Left periorbital hematoma with opacified sinuses, see below. No definite calvarial fracture.  IMPRESSION: Foci of hemorrhagic contusion within the frontal lobes bilaterally. Atrophy with small vessel chronic ischemic changes of deep cerebral white matter. No acute intracranial abnormalities. Facial abnormalities, see below.  CT MAXILLOFACIAL  Findings: Left periorbital  and infraorbital contusion/hematoma. Intraorbital soft tissue planes clear. Small hemorrhagic contusion identified lateral right frontal lobe image 8. No definite extra-axial fluid collections. Opacified frontal maxillary sphenoid sinuses as well as bilateral ethmoid air cells. Nondisplaced fractures of anterior and lateral walls left maxillary sinus. Anterior wall fracture left maxillary sinus extends into the lateral margin of the left anterior nasal passages coronal image 28.  Minimally displaced fracture inferior wall left orbit. No extraocular muscle entrapment. Nasal septum midline. No additional facial bone fractures identified.  IMPRESSION: Nondisplaced fractures of the anterior and lateral walls of the left maxillary sinus with anterior fracture extending into the lateral margin of the left nasal passages anteriorly. Minimally displaced fracture inferior wall left orbit. Diffuse sinus opacification as above.  CT CERVICAL SPINE  Findings: Inhomogeneous thyroid lobes without discrete mass. Visualized skull base intact. Prior cervical spine fusion with intact plate and screws at C4-C5. Question additional bony fusion anteriorly at C5-C6. Multilevel disc space narrowing and endplate spur formation. Multilevel facet degenerative changes. Prevertebral soft tissues  normal thickness. Lung apices clear. No definite acute cervical spine fracture or subluxation identified.  IMPRESSION: Multilevel degenerative disc and facet disease changes of the cervical spine. Prior anterior cervical spine fusion C4-C5 and questionably C5-C6. No acute cervical spine abnormalities.   Original Report Authenticated By: Ulyses Southward, M.D.    Ct Maxillofacial Wo Cm  11/23/2012  *RADIOLOGY REPORT*  Clinical Data:  Head injury, fell down striking left side of face, left eye swelling and bruising, loss of consciousness, nosebleed  CT HEAD WITHOUT CONTRAST CT MAXILLOFACIAL WITHOUT CONTRAST CT CERVICAL SPINE WITHOUT CONTRAST  Technique:  Multidetector CT imaging of the head, cervical spine, and maxillofacial structures were performed using the standard protocol without intravenous contrast. Multiplanar CT image reconstructions of the cervical spine and maxillofacial structures were also generated.  Comparison:  CT head 01/26/2008  CT HEAD  Findings: Mild generalized atrophy. Normal ventricular morphology. No midline shift or mass effect. Small foci of hemorrhagic contusion are identified in the frontal lobes bilaterally, largest in left frontal lobe 7 x 4 mm. Basal ganglia calcifications bilaterally and linear calcification in right cerebellar hemisphere unchanged. Few scattered dural calcifications without definite acute extra- axial fluid collection. No evidence of mass or acute infarction. Left periorbital hematoma with opacified sinuses, see below. No definite calvarial fracture.  IMPRESSION: Foci of hemorrhagic contusion within the frontal lobes bilaterally. Atrophy with small vessel chronic ischemic changes of deep cerebral white matter. No acute intracranial abnormalities. Facial abnormalities, see below.  CT MAXILLOFACIAL  Findings: Left periorbital and infraorbital contusion/hematoma. Intraorbital soft tissue planes clear. Small hemorrhagic contusion identified lateral right frontal lobe image  8. No definite extra-axial fluid collections. Opacified frontal maxillary sphenoid sinuses as well as bilateral ethmoid air cells. Nondisplaced fractures of anterior and lateral walls left maxillary sinus. Anterior wall fracture left maxillary sinus extends into the lateral margin of the left anterior nasal passages coronal image 28.  Minimally displaced fracture inferior wall left orbit. No extraocular muscle entrapment. Nasal septum midline. No additional facial bone fractures identified.  IMPRESSION: Nondisplaced fractures of the anterior and lateral walls of the left maxillary sinus with anterior fracture extending into the lateral margin of the left nasal passages anteriorly. Minimally displaced fracture inferior wall left orbit. Diffuse sinus opacification as above.  CT CERVICAL SPINE  Findings: Inhomogeneous thyroid lobes without discrete mass. Visualized skull base intact. Prior cervical spine fusion with intact plate and screws at C4-C5. Question additional bony fusion anteriorly at C5-C6. Multilevel disc  space narrowing and endplate spur formation. Multilevel facet degenerative changes. Prevertebral soft tissues normal thickness. Lung apices clear. No definite acute cervical spine fracture or subluxation identified.  IMPRESSION: Multilevel degenerative disc and facet disease changes of the cervical spine. Prior anterior cervical spine fusion C4-C5 and questionably C5-C6. No acute cervical spine abnormalities.   Original Report Authenticated By: Ulyses Southward, M.D.      1. Closed head injury, initial encounter   2. Intracranial contusion, unspecified laterality, initial encounter   3. Cranial facial fractures, closed, initial encounter     CRITICAL CARE Performed by: Ranae Palms, Wyatt Galvan   Total critical care time: 20 min  Critical care time was exclusive of separately billable procedures and treating other patients.  Critical care was necessary to treat or prevent imminent or life-threatening  deterioration.  Critical care was time spent personally by me on the following activities: development of treatment plan with patient and/or surrogate as well as nursing, discussions with consultants, evaluation of patient's response to treatment, examination of patient, obtaining history from patient or surrogate, ordering and performing treatments and interventions, ordering and review of laboratory studies, ordering and review of radiographic studies, pulse oximetry and re-evaluation of patient's condition.   MDM  I personally performed the services described in this documentation, which was scribed in my presence. The recorded information has been reviewed and is accurate.   Fred Racer, MD 11/23/12 [phone]

## 2012-11-24 ENCOUNTER — Encounter (HOSPITAL_COMMUNITY): Payer: Self-pay | Admitting: Radiology

## 2012-11-24 ENCOUNTER — Inpatient Hospital Stay (HOSPITAL_COMMUNITY): Payer: Medicare Other

## 2012-11-24 LAB — CBC
Hemoglobin: 14.3 g/dL (ref 13.0–17.0)
MCH: 31 pg (ref 26.0–34.0)
Platelets: 141 10*3/uL — ABNORMAL LOW (ref 150–400)
RBC: 4.62 MIL/uL (ref 4.22–5.81)

## 2012-11-24 LAB — BASIC METABOLIC PANEL
CO2: 25 mEq/L (ref 19–32)
Calcium: 9.1 mg/dL (ref 8.4–10.5)
GFR calc non Af Amer: 78 mL/min — ABNORMAL LOW (ref >90)
Glucose, Bld: 114 mg/dL — ABNORMAL HIGH (ref 70–99)
Potassium: 3.6 mEq/L (ref 3.5–5.1)
Sodium: 137 mEq/L (ref 135–145)

## 2012-11-24 NOTE — Progress Notes (Addendum)
Patient ID: [name], male   DOB: 07-17-1929, 79 y.o.   MRN: [id number]    Subjective: Slept well, feels better, mild facial pain, no headache  Objective: Vital signs in last 24 hours: Temp:  [97.4 F (36.3 C)-99.7 F (37.6 C)] 98.2 F (36.8 C) (04/22 0733) Pulse Rate:  [51-67] 60 (04/22 0700) Resp:  [14-21] 15 (04/22 0700) BP: (115-177)/(56-77) 151/61 mmHg (04/22 0700) SpO2:  [85 %-100 %] 99 % (04/22 0700) Weight:  [96.7 kg (213 lb 3 oz)-99.791 kg (220 lb)] 96.7 kg (213 lb 3 oz) (04/21 1800)    Intake/Output from previous day: 04/21 0701 - 04/22 0700 In: 876.3 [I.V.:876.3] Out: 750 [Urine:750] Intake/Output this shift:    General appearance: alert and cooperative Head: left periorbital ecchymosis and mild deformity Eyes: pupils equal and reactive, left subconjunctival hemorrhage Resp: clear to auscultation bilaterally Chest wall: no tenderness Cardio: regular rate and rhythm GI: soft, nontender, nondistended, +bowel sounds Extremities: calves soft Neurologic: Mental status: Alert, oriented, thought content appropriate Motor: moves all extremities grossly well without weakness  Lab Results: CBC   Recent Labs  11/23/12 1333 11/24/12 0610  WBC 9.1 9.0  HGB 15.1 14.3  HCT 42.7 40.4  PLT 154 141*   BMET  Recent Labs  11/23/12 1329 11/24/12 0610  NA 135 137  K 4.1 3.6  CL 99 103  CO2 26 25  GLUCOSE 159* 114*  BUN 17 13  CREATININE 1.06 0.89  CALCIUM 9.6 9.1   PT/INR  Recent Labs  11/23/12 1329  LABPROT 13.8  INR 1.07   ABG No results found for this basename: PHART, PCO2, PO2, HCO3,  in the last 72 hours  Studies/Results: Ct Head Without Contrast  11/24/2012  *RADIOLOGY REPORT*  Clinical Data: Follow up cerebral contusions  CT HEAD WITHOUT CONTRAST  Technique:  Contiguous axial images were obtained from the base of the skull through the vertex without contrast.  Comparison: CT 11/23/2012  Findings: Sub centimeter areas of hemorrhagic  contusion in the frontal lobes bilaterally are stable.  No new area of hemorrhage.  Generalized atrophy.  Ventricle size is normal.  No midline shift.  Basal ganglia calcification bilaterally is stable.  Right cerebellar calcification is stable.  No acute infarct or mass.  Opacification of the sinuses bilaterally, unchanged  IMPRESSION: Small hemorrhagic contusions both frontal lobes are stable.  No new finding.   Original Report Authenticated By: Janeece Riggers, M.D.    Ct Head Wo Contrast  11/23/2012  *RADIOLOGY REPORT*  Clinical Data:  Head injury, fell down striking left side of face, left eye swelling and bruising, loss of consciousness, nosebleed  CT HEAD WITHOUT CONTRAST CT MAXILLOFACIAL WITHOUT CONTRAST CT CERVICAL SPINE WITHOUT CONTRAST  Technique:  Multidetector CT imaging of the head, cervical spine, and maxillofacial structures were performed using the standard protocol without intravenous contrast. Multiplanar CT image reconstructions of the cervical spine and maxillofacial structures were also generated.  Comparison:  CT head 01/26/2008  CT HEAD  Findings: Mild generalized atrophy. Normal ventricular morphology. No midline shift or mass effect. Small foci of hemorrhagic contusion are identified in the frontal lobes bilaterally, largest in left frontal lobe 7 x 4 mm. Basal ganglia calcifications bilaterally and linear calcification in right cerebellar hemisphere unchanged. Few scattered dural calcifications without definite acute extra- axial fluid collection. No evidence of mass or acute infarction. Left periorbital hematoma with opacified sinuses, see below. No definite calvarial fracture.  IMPRESSION: Foci of hemorrhagic contusion within the frontal lobes bilaterally. Atrophy  with small vessel chronic ischemic changes of deep cerebral white matter. No acute intracranial abnormalities. Facial abnormalities, see below.  CT MAXILLOFACIAL  Findings: Left periorbital and infraorbital contusion/hematoma.  Intraorbital soft tissue planes clear. Small hemorrhagic contusion identified lateral right frontal lobe image 8. No definite extra-axial fluid collections. Opacified frontal maxillary sphenoid sinuses as well as bilateral ethmoid air cells. Nondisplaced fractures of anterior and lateral walls left maxillary sinus. Anterior wall fracture left maxillary sinus extends into the lateral margin of the left anterior nasal passages coronal image 28.  Minimally displaced fracture inferior wall left orbit. No extraocular muscle entrapment. Nasal septum midline. No additional facial bone fractures identified.  IMPRESSION: Nondisplaced fractures of the anterior and lateral walls of the left maxillary sinus with anterior fracture extending into the lateral margin of the left nasal passages anteriorly. Minimally displaced fracture inferior wall left orbit. Diffuse sinus opacification as above.  CT CERVICAL SPINE  Findings: Inhomogeneous thyroid lobes without discrete mass. Visualized skull base intact. Prior cervical spine fusion with intact plate and screws at C4-C5. Question additional bony fusion anteriorly at C5-C6. Multilevel disc space narrowing and endplate spur formation. Multilevel facet degenerative changes. Prevertebral soft tissues normal thickness. Lung apices clear. No definite acute cervical spine fracture or subluxation identified.  IMPRESSION: Multilevel degenerative disc and facet disease changes of the cervical spine. Prior anterior cervical spine fusion C4-C5 and questionably C5-C6. No acute cervical spine abnormalities.   Original Report Authenticated By: Ulyses Southward, M.D.    Ct Cervical Spine Wo Contrast  11/23/2012  *RADIOLOGY REPORT*  Clinical Data:  Head injury, fell down striking left side of face, left eye swelling and bruising, loss of consciousness, nosebleed  CT HEAD WITHOUT CONTRAST CT MAXILLOFACIAL WITHOUT CONTRAST CT CERVICAL SPINE WITHOUT CONTRAST  Technique:  Multidetector CT imaging of the  head, cervical spine, and maxillofacial structures were performed using the standard protocol without intravenous contrast. Multiplanar CT image reconstructions of the cervical spine and maxillofacial structures were also generated.  Comparison:  CT head 01/26/2008  CT HEAD  Findings: Mild generalized atrophy. Normal ventricular morphology. No midline shift or mass effect. Small foci of hemorrhagic contusion are identified in the frontal lobes bilaterally, largest in left frontal lobe 7 x 4 mm. Basal ganglia calcifications bilaterally and linear calcification in right cerebellar hemisphere unchanged. Few scattered dural calcifications without definite acute extra- axial fluid collection. No evidence of mass or acute infarction. Left periorbital hematoma with opacified sinuses, see below. No definite calvarial fracture.  IMPRESSION: Foci of hemorrhagic contusion within the frontal lobes bilaterally. Atrophy with small vessel chronic ischemic changes of deep cerebral white matter. No acute intracranial abnormalities. Facial abnormalities, see below.  CT MAXILLOFACIAL  Findings: Left periorbital and infraorbital contusion/hematoma. Intraorbital soft tissue planes clear. Small hemorrhagic contusion identified lateral right frontal lobe image 8. No definite extra-axial fluid collections. Opacified frontal maxillary sphenoid sinuses as well as bilateral ethmoid air cells. Nondisplaced fractures of anterior and lateral walls left maxillary sinus. Anterior wall fracture left maxillary sinus extends into the lateral margin of the left anterior nasal passages coronal image 28.  Minimally displaced fracture inferior wall left orbit. No extraocular muscle entrapment. Nasal septum midline. No additional facial bone fractures identified.  IMPRESSION: Nondisplaced fractures of the anterior and lateral walls of the left maxillary sinus with anterior fracture extending into the lateral margin of the left nasal passages anteriorly.  Minimally displaced fracture inferior wall left orbit. Diffuse sinus opacification as above.  CT CERVICAL SPINE  Findings: Inhomogeneous thyroid  lobes without discrete mass. Visualized skull base intact. Prior cervical spine fusion with intact plate and screws at C4-C5. Question additional bony fusion anteriorly at C5-C6. Multilevel disc space narrowing and endplate spur formation. Multilevel facet degenerative changes. Prevertebral soft tissues normal thickness. Lung apices clear. No definite acute cervical spine fracture or subluxation identified.  IMPRESSION: Multilevel degenerative disc and facet disease changes of the cervical spine. Prior anterior cervical spine fusion C4-C5 and questionably C5-C6. No acute cervical spine abnormalities.   Original Report Authenticated By: Ulyses Southward, M.D.    Ct Maxillofacial Wo Cm  11/23/2012  *RADIOLOGY REPORT*  Clinical Data:  Head injury, fell down striking left side of face, left eye swelling and bruising, loss of consciousness, nosebleed  CT HEAD WITHOUT CONTRAST CT MAXILLOFACIAL WITHOUT CONTRAST CT CERVICAL SPINE WITHOUT CONTRAST  Technique:  Multidetector CT imaging of the head, cervical spine, and maxillofacial structures were performed using the standard protocol without intravenous contrast. Multiplanar CT image reconstructions of the cervical spine and maxillofacial structures were also generated.  Comparison:  CT head 01/26/2008  CT HEAD  Findings: Mild generalized atrophy. Normal ventricular morphology. No midline shift or mass effect. Small foci of hemorrhagic contusion are identified in the frontal lobes bilaterally, largest in left frontal lobe 7 x 4 mm. Basal ganglia calcifications bilaterally and linear calcification in right cerebellar hemisphere unchanged. Few scattered dural calcifications without definite acute extra- axial fluid collection. No evidence of mass or acute infarction. Left periorbital hematoma with opacified sinuses, see below. No  definite calvarial fracture.  IMPRESSION: Foci of hemorrhagic contusion within the frontal lobes bilaterally. Atrophy with small vessel chronic ischemic changes of deep cerebral white matter. No acute intracranial abnormalities. Facial abnormalities, see below.  CT MAXILLOFACIAL  Findings: Left periorbital and infraorbital contusion/hematoma. Intraorbital soft tissue planes clear. Small hemorrhagic contusion identified lateral right frontal lobe image 8. No definite extra-axial fluid collections. Opacified frontal maxillary sphenoid sinuses as well as bilateral ethmoid air cells. Nondisplaced fractures of anterior and lateral walls left maxillary sinus. Anterior wall fracture left maxillary sinus extends into the lateral margin of the left anterior nasal passages coronal image 28.  Minimally displaced fracture inferior wall left orbit. No extraocular muscle entrapment. Nasal septum midline. No additional facial bone fractures identified.  IMPRESSION: Nondisplaced fractures of the anterior and lateral walls of the left maxillary sinus with anterior fracture extending into the lateral margin of the left nasal passages anteriorly. Minimally displaced fracture inferior wall left orbit. Diffuse sinus opacification as above.  CT CERVICAL SPINE  Findings: Inhomogeneous thyroid lobes without discrete mass. Visualized skull base intact. Prior cervical spine fusion with intact plate and screws at C4-C5. Question additional bony fusion anteriorly at C5-C6. Multilevel disc space narrowing and endplate spur formation. Multilevel facet degenerative changes. Prevertebral soft tissues normal thickness. Lung apices clear. No definite acute cervical spine fracture or subluxation identified.  IMPRESSION: Multilevel degenerative disc and facet disease changes of the cervical spine. Prior anterior cervical spine fusion C4-C5 and questionably C5-C6. No acute cervical spine abnormalities.   Original Report Authenticated By: Ulyses Southward,  M.D.     Anti-infectives: Anti-infectives   None      Assessment/Plan: FALL TBI/B frontal ICC - Followup CT head stable, appreciate Dr. Lovell Sheehan input and we spoke at bedside regarding plan L orbit and Maxillary sinus FXs - per Dr. Pollyann Kennedy FEN - clears and advance Deconditioning - PT/OT VTE - PAS (TBI) To floor   LOS: 1 day    Violeta Gelinas, MD, MPH, FACS  Pager: [phone]  11/24/2012

## 2012-11-24 NOTE — Progress Notes (Signed)
UR completed 

## 2012-11-24 NOTE — Progress Notes (Signed)
Patient ID: [name], male   DOB: 09-21-1928, 79 y.o.   MRN: [id number] Subjective:  Patient is alert and pleasant. He has no complaints. He looks well.  Objective: Vital signs in last 24 hours: Temp:  [97.4 F (36.3 C)-99.7 F (37.6 C)] 98.2 F (36.8 C) (04/22 0733) Pulse Rate:  [51-67] 60 (04/22 0700) Resp:  [14-21] 15 (04/22 0700) BP: (115-177)/(56-77) 151/61 mmHg (04/22 0700) SpO2:  [85 %-100 %] 99 % (04/22 0700) Weight:  [96.7 kg (213 lb 3 oz)-99.791 kg (220 lb)] 96.7 kg (213 lb 3 oz) (04/21 1800)  Intake/Output from previous day: 04/21 0701 - 04/22 0700 In: 876.3 [I.V.:876.3] Out: 750 [Urine:750] Intake/Output this shift:    Physical exam the patient is alert and oriented. He is moving all 4 extremities well.  Lab Results:  Recent Labs  11/23/12 1333 11/24/12 0610  WBC 9.1 9.0  HGB 15.1 14.3  HCT 42.7 40.4  PLT 154 141*   BMET  Recent Labs  11/23/12 1329 11/24/12 0610  NA 135 137  K 4.1 3.6  CL 99 103  CO2 26 25  GLUCOSE 159* 114*  BUN 17 13  CREATININE 1.06 0.89  CALCIUM 9.6 9.1    Studies/Results: Ct Head Wo Contrast  11/23/2012  *RADIOLOGY REPORT*  Clinical Data:  Head injury, fell down striking left side of face, left eye swelling and bruising, loss of consciousness, nosebleed  CT HEAD WITHOUT CONTRAST CT MAXILLOFACIAL WITHOUT CONTRAST CT CERVICAL SPINE WITHOUT CONTRAST  Technique:  Multidetector CT imaging of the head, cervical spine, and maxillofacial structures were performed using the standard protocol without intravenous contrast. Multiplanar CT image reconstructions of the cervical spine and maxillofacial structures were also generated.  Comparison:  CT head 01/26/2008  CT HEAD  Findings: Mild generalized atrophy. Normal ventricular morphology. No midline shift or mass effect. Small foci of hemorrhagic contusion are identified in the frontal lobes bilaterally, largest in left frontal lobe 7 x 4 mm. Basal ganglia calcifications bilaterally and  linear calcification in right cerebellar hemisphere unchanged. Few scattered dural calcifications without definite acute extra- axial fluid collection. No evidence of mass or acute infarction. Left periorbital hematoma with opacified sinuses, see below. No definite calvarial fracture.  IMPRESSION: Foci of hemorrhagic contusion within the frontal lobes bilaterally. Atrophy with small vessel chronic ischemic changes of deep cerebral white matter. No acute intracranial abnormalities. Facial abnormalities, see below.  CT MAXILLOFACIAL  Findings: Left periorbital and infraorbital contusion/hematoma. Intraorbital soft tissue planes clear. Small hemorrhagic contusion identified lateral right frontal lobe image 8. No definite extra-axial fluid collections. Opacified frontal maxillary sphenoid sinuses as well as bilateral ethmoid air cells. Nondisplaced fractures of anterior and lateral walls left maxillary sinus. Anterior wall fracture left maxillary sinus extends into the lateral margin of the left anterior nasal passages coronal image 28.  Minimally displaced fracture inferior wall left orbit. No extraocular muscle entrapment. Nasal septum midline. No additional facial bone fractures identified.  IMPRESSION: Nondisplaced fractures of the anterior and lateral walls of the left maxillary sinus with anterior fracture extending into the lateral margin of the left nasal passages anteriorly. Minimally displaced fracture inferior wall left orbit. Diffuse sinus opacification as above.  CT CERVICAL SPINE  Findings: Inhomogeneous thyroid lobes without discrete mass. Visualized skull base intact. Prior cervical spine fusion with intact plate and screws at C4-C5. Question additional bony fusion anteriorly at C5-C6. Multilevel disc space narrowing and endplate spur formation. Multilevel facet degenerative changes. Prevertebral soft tissues normal thickness. Lung apices clear. No  definite acute cervical spine fracture or subluxation  identified.  IMPRESSION: Multilevel degenerative disc and facet disease changes of the cervical spine. Prior anterior cervical spine fusion C4-C5 and questionably C5-C6. No acute cervical spine abnormalities.   Original Report Authenticated By: Ulyses Southward, M.D.    Ct Cervical Spine Wo Contrast  11/23/2012  *RADIOLOGY REPORT*  Clinical Data:  Head injury, fell down striking left side of face, left eye swelling and bruising, loss of consciousness, nosebleed  CT HEAD WITHOUT CONTRAST CT MAXILLOFACIAL WITHOUT CONTRAST CT CERVICAL SPINE WITHOUT CONTRAST  Technique:  Multidetector CT imaging of the head, cervical spine, and maxillofacial structures were performed using the standard protocol without intravenous contrast. Multiplanar CT image reconstructions of the cervical spine and maxillofacial structures were also generated.  Comparison:  CT head 01/26/2008  CT HEAD  Findings: Mild generalized atrophy. Normal ventricular morphology. No midline shift or mass effect. Small foci of hemorrhagic contusion are identified in the frontal lobes bilaterally, largest in left frontal lobe 7 x 4 mm. Basal ganglia calcifications bilaterally and linear calcification in right cerebellar hemisphere unchanged. Few scattered dural calcifications without definite acute extra- axial fluid collection. No evidence of mass or acute infarction. Left periorbital hematoma with opacified sinuses, see below. No definite calvarial fracture.  IMPRESSION: Foci of hemorrhagic contusion within the frontal lobes bilaterally. Atrophy with small vessel chronic ischemic changes of deep cerebral white matter. No acute intracranial abnormalities. Facial abnormalities, see below.  CT MAXILLOFACIAL  Findings: Left periorbital and infraorbital contusion/hematoma. Intraorbital soft tissue planes clear. Small hemorrhagic contusion identified lateral right frontal lobe image 8. No definite extra-axial fluid collections. Opacified frontal maxillary sphenoid  sinuses as well as bilateral ethmoid air cells. Nondisplaced fractures of anterior and lateral walls left maxillary sinus. Anterior wall fracture left maxillary sinus extends into the lateral margin of the left anterior nasal passages coronal image 28.  Minimally displaced fracture inferior wall left orbit. No extraocular muscle entrapment. Nasal septum midline. No additional facial bone fractures identified.  IMPRESSION: Nondisplaced fractures of the anterior and lateral walls of the left maxillary sinus with anterior fracture extending into the lateral margin of the left nasal passages anteriorly. Minimally displaced fracture inferior wall left orbit. Diffuse sinus opacification as above.  CT CERVICAL SPINE  Findings: Inhomogeneous thyroid lobes without discrete mass. Visualized skull base intact. Prior cervical spine fusion with intact plate and screws at C4-C5. Question additional bony fusion anteriorly at C5-C6. Multilevel disc space narrowing and endplate spur formation. Multilevel facet degenerative changes. Prevertebral soft tissues normal thickness. Lung apices clear. No definite acute cervical spine fracture or subluxation identified.  IMPRESSION: Multilevel degenerative disc and facet disease changes of the cervical spine. Prior anterior cervical spine fusion C4-C5 and questionably C5-C6. No acute cervical spine abnormalities.   Original Report Authenticated By: Ulyses Southward, M.D.    Ct Maxillofacial Wo Cm  11/23/2012  *RADIOLOGY REPORT*  Clinical Data:  Head injury, fell down striking left side of face, left eye swelling and bruising, loss of consciousness, nosebleed  CT HEAD WITHOUT CONTRAST CT MAXILLOFACIAL WITHOUT CONTRAST CT CERVICAL SPINE WITHOUT CONTRAST  Technique:  Multidetector CT imaging of the head, cervical spine, and maxillofacial structures were performed using the standard protocol without intravenous contrast. Multiplanar CT image reconstructions of the cervical spine and maxillofacial  structures were also generated.  Comparison:  CT head 01/26/2008  CT HEAD  Findings: Mild generalized atrophy. Normal ventricular morphology. No midline shift or mass effect. Small foci of hemorrhagic contusion are identified in the  frontal lobes bilaterally, largest in left frontal lobe 7 x 4 mm. Basal ganglia calcifications bilaterally and linear calcification in right cerebellar hemisphere unchanged. Few scattered dural calcifications without definite acute extra- axial fluid collection. No evidence of mass or acute infarction. Left periorbital hematoma with opacified sinuses, see below. No definite calvarial fracture.  IMPRESSION: Foci of hemorrhagic contusion within the frontal lobes bilaterally. Atrophy with small vessel chronic ischemic changes of deep cerebral white matter. No acute intracranial abnormalities. Facial abnormalities, see below.  CT MAXILLOFACIAL  Findings: Left periorbital and infraorbital contusion/hematoma. Intraorbital soft tissue planes clear. Small hemorrhagic contusion identified lateral right frontal lobe image 8. No definite extra-axial fluid collections. Opacified frontal maxillary sphenoid sinuses as well as bilateral ethmoid air cells. Nondisplaced fractures of anterior and lateral walls left maxillary sinus. Anterior wall fracture left maxillary sinus extends into the lateral margin of the left anterior nasal passages coronal image 28.  Minimally displaced fracture inferior wall left orbit. No extraocular muscle entrapment. Nasal septum midline. No additional facial bone fractures identified.  IMPRESSION: Nondisplaced fractures of the anterior and lateral walls of the left maxillary sinus with anterior fracture extending into the lateral margin of the left nasal passages anteriorly. Minimally displaced fracture inferior wall left orbit. Diffuse sinus opacification as above.  CT CERVICAL SPINE  Findings: Inhomogeneous thyroid lobes without discrete mass. Visualized skull base  intact. Prior cervical spine fusion with intact plate and screws at C4-C5. Question additional bony fusion anteriorly at C5-C6. Multilevel disc space narrowing and endplate spur formation. Multilevel facet degenerative changes. Prevertebral soft tissues normal thickness. Lung apices clear. No definite acute cervical spine fracture or subluxation identified.  IMPRESSION: Multilevel degenerative disc and facet disease changes of the cervical spine. Prior anterior cervical spine fusion C4-C5 and questionably C5-C6. No acute cervical spine abnormalities.   Original Report Authenticated By: Ulyses Southward, M.D.    I have reviewed the patient's followup head CT. It demonstrates no significant change in the small bifrontal contusions.  Assessment/Plan: Close head injury, small cerebral contusions: The patient is doing well. He can be mobilized and subsequently discharged from my point of view. He can followup with me on a when necessary basis. I will sign off. Please call if I can be of further assistance.  LOS: 1 day     Kristofer Schaffert D 11/24/2012, 7:45 AM

## 2012-11-25 DIAGNOSIS — S0285XA Fracture of orbit, unspecified, initial encounter for closed fracture: Secondary | ICD-10-CM

## 2012-11-25 DIAGNOSIS — S069XAA Unspecified intracranial injury with loss of consciousness status unknown, initial encounter: Secondary | ICD-10-CM

## 2012-11-25 DIAGNOSIS — S02401A Maxillary fracture, unspecified, initial encounter for closed fracture: Secondary | ICD-10-CM

## 2012-11-25 DIAGNOSIS — S069X9A Unspecified intracranial injury with loss of consciousness of unspecified duration, initial encounter: Secondary | ICD-10-CM

## 2012-11-25 DIAGNOSIS — R296 Repeated falls: Secondary | ICD-10-CM

## 2012-11-25 DIAGNOSIS — J329 Chronic sinusitis, unspecified: Secondary | ICD-10-CM | POA: Diagnosis present

## 2012-11-25 LAB — BASIC METABOLIC PANEL
Chloride: 103 mEq/L (ref 96–112)
GFR calc Af Amer: >90 mL/min (ref >90)
GFR calc non Af Amer: 82 mL/min — ABNORMAL LOW (ref >90)
Potassium: 3.8 mEq/L (ref 3.5–5.1)
Sodium: 136 mEq/L (ref 135–145)

## 2012-11-25 MED ORDER — DOXYCYCLINE HYCLATE 100 MG PO TABS: 100.0000 mg | ORAL_TABLET | Freq: Two times a day (BID) | ORAL | Status: DC

## 2012-11-25 MED ORDER — DOCUSATE SODIUM 100 MG PO CAPS
100.0000 mg | ORAL_CAPSULE | Freq: Two times a day (BID) | ORAL | Status: DC
  Administered 2012-11-25: 100 mg via ORAL
  Filled 2012-11-25: qty 1

## 2012-11-25 MED ORDER — MORPHINE SULFATE 2 MG/ML IJ SOLN: 2.0000 mg | INTRAMUSCULAR | Status: DC | PRN

## 2012-11-25 MED ORDER — FLUTICASONE PROPIONATE 50 MCG/ACT NA SUSP: 2.0000 | Freq: Every day | NASAL | Status: DC

## 2012-11-25 MED ORDER — TRAMADOL HCL 50 MG PO TABS: 50.0000 mg | ORAL_TABLET | Freq: Four times a day (QID) | ORAL | Status: DC | PRN

## 2012-11-25 MED ORDER — POLYETHYLENE GLYCOL 3350 17 G PO PACK
17.0000 g | PACK | Freq: Every day | ORAL | Status: DC
  Administered 2012-11-25: 17 g via ORAL
  Filled 2012-11-25: qty 1

## 2012-11-25 MED ORDER — DOXYCYCLINE HYCLATE 100 MG PO TABS
100.0000 mg | ORAL_TABLET | Freq: Two times a day (BID) | ORAL | Status: DC
  Administered 2012-11-25: 100 mg via ORAL
  Filled 2012-11-25 (×2): qty 1

## 2012-11-25 MED ORDER — FLUTICASONE PROPIONATE 50 MCG/ACT NA SUSP
2.0000 | Freq: Every day | NASAL | Status: DC
  Administered 2012-11-25: 2 via NASAL
  Filled 2012-11-25: qty 16

## 2012-11-25 MED ORDER — PREDNISONE 20 MG PO TABS: 60.0000 mg | ORAL_TABLET | Freq: Every day | ORAL | Status: DC

## 2012-11-25 MED ORDER — PREDNISONE 50 MG PO TABS
60.0000 mg | ORAL_TABLET | Freq: Every day | ORAL | Status: DC
  Administered 2012-11-25: 60 mg via ORAL
  Filled 2012-11-25 (×2): qty 1

## 2012-11-25 NOTE — Clinical Social Work Note (Signed)
Clinical Social Work Department BRIEF PSYCHOSOCIAL ASSESSMENT 11/25/2012  Patient:  [name], STAINBACK     Account Number:  [id number]     Admit date:  11/23/2012  Clinical Social Worker:  Verl Blalock  Date/Time:  11/25/2012 04:22 PM  Referred by:  Care Management  Date Referred:  11/25/2012 Referred for  Psychosocial assessment   Other Referral:   Interview type:  Patient Other interview type:   Spoke to patient daughter in law over the phone to relay patient discharge plans    PSYCHOSOCIAL DATA Living Status:  ALONE Admitted from facility:   Level of care:   Primary support name:  Harvie Heck / Harriett Sine [phone] Primary support relationship to patient:  CHILD, ADULT Degree of support available:   Strong    CURRENT CONCERNS Current Concerns  None Noted   Other Concerns:    SOCIAL WORK ASSESSMENT / PLAN Clinical Social Worker met with patient at bedside to offer support and discuss patient needs at discharge.  Patient states that he lives at home alone but had a witnessed fall at a friends home.  Patient realistic and understanding that he cannot return home alone and has agreed to go stay with his lady friend, Randel Books.  With patient permission, CSW spoke with patient daughter in law, Tiwan Schnitker who confirmed that patient son would be willing to bring patient clothes and provide transportation to [employer].  PA/CM aware of patient plans and plan for discharge tonight.    Clinical Social Worker inquired about current substance use.  Patient states, "there was no alcohol when I fell and none at all for that matter."  Patient with no concerns regarding current substance use.  SBIRT complete and no resources needed at this time.  CSW signing off.  Please reconsult if further needs arise prior to patient discharge.   Assessment/plan status:  No Further Intervention Required Other assessment/ plan:   Information/referral to community resources:   Patient  declined all resources but agreeable to home health - CM has arranged    PATIENT'S/FAMILY'S RESPONSE TO PLAN OF CARE: Patient alert and oriented x3 sitting up in the chair. Patient feels he has adequate 24 hour support through his lady friend and his children.  Patient daughter in law confirmed that patient would have assistance at discharge. Patient understanding that he cannot return home alone and has made alternative arrangements.  Patient and family verbalized their appreciation for CSW support and concern.   Macario Golds, [place] [phone]

## 2012-11-25 NOTE — Progress Notes (Addendum)
HHPT/OT arranged with [facility] per pt choice and insurance network.  Confirmed address and phone number in EPIC as correct for patient, however patient is going to stay with Randel Books. Attempted to call son re: discharge today but was only able to leave voicemail.   16:11  CSW spoke with patient daughter in law Josephus Harriger) who confirmed patient would be staying with Randel Books at [address] [place], [place].  Patient mobile phone number is correct in EPIC and can be reached at that number while staying with Raynelle Fanning.

## 2012-11-25 NOTE — Progress Notes (Signed)
Patient examined and I agree with the assessment and plan  Violeta Gelinas, MD, MPH, FACS Pager: [phone]  11/25/2012 12:30 PM

## 2012-11-25 NOTE — Evaluation (Signed)
Physical Therapy Evaluation Patient Details Name: [name] MRN: [id number] DOB: 1929/06/23 Today's Date: 11/25/2012 Time: 1191-4782 PT Time Calculation (min): 26 min  PT Assessment / Plan / Recommendation Clinical Impression  Patient is an 79 y/o male admitted s/p fall with facial fractures who presents with decreased mobility due to decreased balance, decreased right ankle mobility due to mild pain s/p fall and will benefit from skilled PT in the acute setting to maximize independence and safety prior to d/c home with assist initially from his 16 y/o girlfriend and her daughters.  Will also benefit from HHPT.  Of note pt initially agreed to this plan, seems fiercly independent and may instead decide to go home alone.  Seems competent to make decisions though still at risk for falls.    PT Assessment  Patient needs continued PT services    Follow Up Recommendations  Home health PT;Supervision/Assistance - 24 hour (initial 24 hour assist available)          Equipment Recommendations  None recommended by PT       Frequency Min 3X/week    Precautions / Restrictions Precautions Precautions: Fall Precaution Comments: h/o falls reports "trips" over things   Pertinent Vitals/Pain C/o mild pain right ankle with activity      Mobility  Bed Mobility Bed Mobility: Not assessed Details for Bed Mobility Assistance: up in chair Transfers Transfers: Sit to Stand;Stand to Sit Sit to Stand: With upper extremity assist;5: Supervision;From chair/3-in-1 Stand to Sit: 5: Supervision;With upper extremity assist;To chair/3-in-1 Details for Transfer Assistance: for safety due to iniitally unsteady in standing Ambulation/Gait Ambulation/Gait Assistance: 4: Min guard;5: Supervision Ambulation Distance (Feet): 200 Feet Assistive device: Rolling walker;None Ambulation/Gait Assistance Details: Initially unsteady so used walker for first 2/3 distance with supervision, pt increasingly forward  flexed so switched to no device with continued forward flexion, though not as prominent and minguard for safety Gait Pattern: Step-through pattern;Trunk flexed;Decreased stride length;Shuffle    Exercises     PT Diagnosis: Abnormality of gait  PT Problem List: Pain;Decreased balance;Decreased safety awareness;Decreased knowledge of use of DME;Decreased mobility PT Treatment Interventions: Balance training;Gait training;DME instruction;Neuromuscular re-education;Stair training;Functional mobility training;Patient/family education;Therapeutic activities;Therapeutic exercise   PT Goals Acute Rehab PT Goals PT Goal Formulation: With patient Time For Goal Achievement: 12/02/12 Potential to Achieve Goals: Good Pt will go Sit to Stand: with modified independence PT Goal: Sit to Stand - Progress: Goal set today Pt will go Stand to Sit: with modified independence PT Goal: Stand to Sit - Progress: Goal set today Pt will Stand: Independently;3 - 5 min;with no upper extremity support;with unilateral upper extremity support PT Goal: Stand - Progress: Goal set today Pt will Ambulate: >150 feet;with modified independence;with least restrictive assistive device PT Goal: Ambulate - Progress: Goal set today Pt will Go Up / Down Stairs: 3-5 stairs;with rail(s);with modified independence PT Goal: Up/Down Stairs - Progress: Goal set today  Visit Information  Last PT Received On: 11/25/12 Assistance Needed: +1    Subjective Data  Subjective: I will go wherever I want when I leave here. Patient Stated Goal: To return to independent, go home or to "lady friend's" home   Prior Functioning  Home Living Lives With: Alone Type of Home: House Home Access: Stairs to enter [employer] of Steps: 3-4 Entrance Stairs-Rails: Can reach both;Left;Right Home Layout: One level Bathroom Shower/Tub: Health visitor: Standard Home Adaptive Equipment: Built-in shower seat;Straight  cane;Walker - rolling Prior Function Level of Independence: Independent Able to Take Stairs?:  Yes Driving: Yes Vocation: Retired Musician: No difficulties (talks fast and mumbles at times)    Copywriter, advertising Arousal/Alertness: Awake/alert Behavior During Therapy: WFL for tasks assessed/performed Overall Cognitive Status: Within Functional Limits for tasks assessed    Extremity/Trunk Assessment Right Lower Extremity Assessment RLE ROM/Strength/Tone: Within functional levels RLE Sensation: WFL - Light Touch Left Lower Extremity Assessment LLE ROM/Strength/Tone: Within functional levels LLE Sensation: WFL - Light Touch   Balance Balance Balance Assessed: Yes Static Standing Balance Static Standing - Balance Support: No upper extremity supported Static Standing - Level of Assistance: 5: Stand by assistance Static Standing - Comment/# of Minutes: standing prior to sitting few seconds with supervision for safety (initially minguard due to more unsteady initially)  End of Session PT - End of Session Equipment Utilized During Treatment: Gait belt Activity Tolerance: Patient tolerated treatment well Patient left: in chair;with call bell/phone within reach;with family/visitor present  GP     [facility] 11/25/2012, 1:21 PM  [place], PT [phone] 11/25/2012

## 2012-11-25 NOTE — Evaluation (Signed)
Occupational Therapy Evaluation Patient Details Name: [name] MRN: [id number] DOB: 01/28/1929 Today's Date: 11/25/2012 Time: 4098-1191 OT Time Calculation (min): 24 min  OT Assessment / Plan / Recommendation Clinical Impression  Pt is an 79 yr old male admitted after fall resutling in facial fractures and TBI.  Pt overall doing well is performing selfcare tasks at a supervision.  Feel pt will have adequate 24 hour supervision at discharge from significant other and family. Feel pt does not warrant any futher OT needs at this time or DME.    OT Assessment  Patient needs continued OT Services;Patient does not need any further OT services    Follow Up Recommendations  No OT follow up       Equipment Recommendations  None recommended by OT          Precautions / Restrictions Precautions Precautions: Fall Precaution Comments: h/o falls reports "trips" over things Restrictions Weight Bearing Restrictions: No   Pertinent Vitals/Pain No report of pain when asked    ADL  Eating/Feeding: Simulated;Independent Where Assessed - Eating/Feeding: Chair Grooming: Performed;Supervision/safety Where Assessed - Grooming: Unsupported standing Upper Body Bathing: Simulated;Set up Where Assessed - Upper Body Bathing: Unsupported sitting Lower Body Bathing: Simulated;Supervision/safety Where Assessed - Lower Body Bathing: Unsupported sit to stand Upper Body Dressing: Simulated;Set up Where Assessed - Upper Body Dressing: Unsupported sitting Lower Body Dressing: Simulated;Supervision/safety Where Assessed - Lower Body Dressing: Unsupported sit to stand Toilet Transfer: Performed;Supervision/safety Toilet Transfer Method: Other (comment) (ambulate without assistive device) Toilet Transfer Equipment: Comfort height toilet;Grab bars Toileting - Clothing Manipulation and Hygiene: Simulated;Supervision/safety Where Assessed - Toileting Clothing Manipulation and Hygiene: Sit to stand from  3-in-1 or toilet Tub/Shower Transfer Method: Not assessed Transfers/Ambulation Related to ADLs: Pt overall supervision level for mobility and standing balance during grooming and toileting tasks.   ADL Comments: Pt overall supervision level for simulated selfcare tasks and functional transfers.  Will have initial supervision from his girlfriend and girlfriends daughter.  Already has all needed shower DME.      Visit Information  Last OT Received On: 11/25/12 Assistance Needed: +1    Subjective Data  Subjective: I'll do whatever you guys tell me to do. Patient Stated Goal: Did not state this session.   Prior Functioning     Home Living Lives With: Alone Type of Home: House Home Access: Stairs to enter Entrance Stairs-Number of Steps: 3-4 Entrance Stairs-Rails: Can reach both;Left;Right Home Layout: One level Bathroom Shower/Tub: Health visitor: Standard Bathroom Accessibility: Yes Home Adaptive Equipment: Straight cane;Walker - rolling;Shower chair with back;Grab bars in shower;Grab bars around toilet Prior Function Level of Independence: Independent Able to Take Stairs?: Yes Driving: Yes Vocation: Retired Musician: No difficulties (talks fast and mumbles at times) Dominant Hand: Right         Vision/Perception Vision - History Baseline Vision: Wears glasses only for reading Patient Visual Report: No change from baseline Vision - Assessment Eye Alignment: Within Functional Limits Additional Comments: Pt with bloodshot left eye but reports no changes in vision.  Able to read the calendar and TV without difficulty. Perception Perception: Within Functional Limits Praxis Praxis: Intact   Cognition  Cognition Arousal/Alertness: Awake/alert Behavior During Therapy: WFL for tasks assessed/performed Overall Cognitive Status: Within Functional Limits for tasks assessed    Extremity/Trunk Assessment Right Upper Extremity  Assessment RUE ROM/Strength/Tone: Within functional levels RUE Sensation: WFL - Light Touch RUE Coordination: WFL - gross/fine motor Left Upper Extremity Assessment LUE ROM/Strength/Tone: Within functional levels LUE Sensation:  WFL - Light Touch LUE Coordination: WFL - gross/fine motor Right Lower Extremity Assessment RLE ROM/Strength/Tone: Within functional levels RLE Sensation: WFL - Light Touch Left Lower Extremity Assessment LLE ROM/Strength/Tone: Within functional levels LLE Sensation: WFL - Light Touch     Mobility Bed Mobility Bed Mobility: Not assessed Details for Bed Mobility Assistance: up in chair Transfers Transfers: Sit to Stand Sit to Stand: 5: Supervision;With upper extremity assist;From elevated surface Stand to Sit: 5: Supervision;With upper extremity assist;To chair/3-in-1 Details for Transfer Assistance: for safety due to iniitally unsteady in standing        Balance Balance Balance Assessed: Yes Static Standing Balance Static Standing - Balance Support: No upper extremity supported Static Standing - Level of Assistance: 5: Stand by assistance Static Standing - Comment/# of Minutes: standing prior to sitting few seconds with supervision for safety (initially minguard due to more unsteady initially) Dynamic Standing Balance Dynamic Standing - Balance Support: No upper extremity supported Dynamic Standing - Level of Assistance: 5: Stand by assistance;Other (comment) (during selfcare tasks)   End of Session OT - End of Session Activity Tolerance: Patient tolerated treatment well Patient left: in chair;with call bell/phone within reach Nurse Communication: Mobility status      [name] OTR/L Pager number [phone] 11/25/2012, 2:06 PM

## 2012-11-25 NOTE — Progress Notes (Signed)
Patient ID: [name], male   DOB: May 12, 1929, 79 y.o.   MRN: [id number]   LOS: 2 days   Subjective: No c/o. Does admit to "sinus trouble", would be very appreciative for some treatment of same.   Objective: Vital signs in last 24 hours: Temp:  [97.4 F (36.3 C)-98.2 F (36.8 C)] 97.6 F (36.4 C) (04/23 0512) Pulse Rate:  [53-72] 68 (04/23 0512) Resp:  [18-23] 18 (04/23 0512) BP: (118-165)/(47-87) 165/61 mmHg (04/23 0512) SpO2:  [95 %-99 %] 97 % (04/23 0512) Last BM Date: 11/23/12   Laboratory  CBC  Recent Labs  11/23/12 1333 11/24/12 0610  WBC 9.1 9.0  HGB 15.1 14.3  HCT 42.7 40.4  PLT 154 141*   BMET  Recent Labs  11/24/12 0610 11/25/12 0631  NA 137 136  K 3.6 3.8  CL 103 103  CO2 25 24  GLUCOSE 114* 128*  BUN 13 15  CREATININE 0.89 0.80  CALCIUM 9.1 9.3    Physical Exam General appearance: alert and no distress Resp: clear to auscultation bilaterally Cardio: regular rate and rhythm GI: normal findings: bowel sounds normal and soft, non-tender Neuro: A&A   Assessment/Plan: FALL  TBI/B frontal ICC - No sequelae L orbit and Maxillary sinus FXs - Nonoperative per Dr. Emeline Darling Sinusitis -- Steroids (oral and nasal) and abx per Dr. Emeline Darling Multiple medical problems -- Home meds FEN - No issues VTE - SCD's Dispo -- PT/OT    Freeman Caldron, PA-C Pager: [phone] General Trauma PA Pager: [phone]   11/25/2012

## 2012-11-26 NOTE — Discharge Summary (Signed)
Physician Discharge Summary  Patient ID: [name] MRN: [id number] DOB/AGE: 03/25/1929 79 y.o.  Admit date: 11/23/2012 Discharge date: 11/26/2012  Discharge Diagnoses Patient Active Problem List   Diagnosis Date Noted  . Fall 11/25/2012  . TBI (traumatic brain injury) 11/25/2012  . Orbit fracture 11/25/2012  . Maxillary sinus fracture 11/25/2012  . Sinusitis 11/25/2012  . Frequent falls 11/25/2012  . OA (osteoarthritis)   . Metabolic syndrome   . ED (erectile dysfunction)   . Chronic fatigue   . Gout   . Bowing of leg   . Spinal stenosis   . Prediabetes   . Prostate cancer   . Hypertension 05/07/2012  . Dysphagia 05/07/2012    Consultants Dr. Tressie Stalker for neurosurgery  Dr. Melvenia Beam for ENT   Procedures None   HPI: [name] fell from ground level landing on the left side of his face. He was unconscious for several minutes and amnestic for the event. He was evaluated at [facility] and found to have the brain injury and facial fractures. He was transferred to [facility] for admission to the trauma service and specialist consultation.   Hospital Course: The patient did not suffer any neurologic complications from his brain injury and a repeat head CT was stable. ENT determined his facial fractures were nonoperative but he was noted to have a sinusitis and ENT recommended a treatment regimen. He was mobilized with physical and occupational therapies who cleared him for discharge. He was discharged home in the care of his son in good condition.      Medication List    TAKE these medications       amLODipine 10 MG tablet  Commonly known as:  NORVASC  Take 10 mg by mouth every morning.     diclofenac sodium 1 % Gel  Commonly known as:  VOLTAREN  Apply 4 g topically 4 (four) times daily as needed. Pain     doxycycline 100 MG tablet  Commonly known as:  VIBRA-TABS  Take 1 tablet (100 mg total) by mouth every 12 (twelve) hours.     fenofibrate 145 MG  tablet  Commonly known as:  TRICOR  Take 145 mg by mouth daily.     fish oil-omega-3 fatty acids 1000 MG capsule  Take 2 g by mouth every morning.     fluticasone 50 MCG/ACT nasal spray  Commonly known as:  FLONASE  Place 2 sprays into the nose daily.     furosemide 20 MG tablet  Commonly known as:  LASIX  Take 20 mg by mouth every morning.     metoprolol 100 MG tablet  Commonly known as:  LOPRESSOR  Take 100 mg by mouth every morning.     MUSCLE RUB 10-15 % Crea  Apply 1 application topically daily as needed (FOR PAIN).     predniSONE 20 MG tablet  Commonly known as:  DELTASONE  Take 3 tablets (60 mg total) by mouth daily with breakfast.     sildenafil 50 MG tablet  Commonly known as:  VIAGRA  Take 50 mg by mouth daily as needed.     traMADol 50 MG tablet  Commonly known as:  ULTRAM  Take 1 tablet (50 mg total) by mouth every 6 (six) hours as needed for pain. For pain     VITAMIN B12 PO  Take 1 tablet by mouth every morning.             Follow-up Information   Schedule an appointment as  soon as possible for a visit with Melvenia Beam, MD.   Contact information:   [address] [place] [address] [phone]       Call [facility]. (As needed)    Contact information:   [address] [place] [address] [phone]       Signed: Freeman Caldron, PA-C Pager: [phone] General Trauma PA Pager: [phone]  11/26/2012, 10:05 AM

## 2012-12-15 ENCOUNTER — Telehealth: Payer: Self-pay | Admitting: Family Medicine

## 2012-12-15 MED ORDER — FUROSEMIDE 20 MG PO TABS: 20.0000 mg | ORAL_TABLET | Freq: Every morning | ORAL | Status: DC

## 2012-12-15 NOTE — Telephone Encounter (Signed)
Rx Refilled  

## 2012-12-15 NOTE — Telephone Encounter (Signed)
Lasix 20mg  Take one tablet every morning last rf 10/27/2012

## 2012-12-18 ENCOUNTER — Encounter: Payer: Self-pay | Admitting: Family Medicine

## 2012-12-18 ENCOUNTER — Ambulatory Visit (INDEPENDENT_AMBULATORY_CARE_PROVIDER_SITE_OTHER): Payer: Medicare Other | Admitting: Family Medicine

## 2012-12-18 VITALS — BP 110/58 | HR 56 | Temp 97.8°F | Resp 14 | Wt 217.0 lb

## 2012-12-18 DIAGNOSIS — R5381 Other malaise: Secondary | ICD-10-CM

## 2012-12-18 DIAGNOSIS — R5383 Other fatigue: Secondary | ICD-10-CM

## 2012-12-18 DIAGNOSIS — R55 Syncope and collapse: Secondary | ICD-10-CM

## 2012-12-18 NOTE — Progress Notes (Signed)
Subjective:    Patient ID: [name], male    DOB: 02/15/1929, 79 y.o.   MRN: [id number]  HPI Patient was admitted April 21 of April 24 after falling and suffering a fracture of his maxillary sinus, a mild cerebral hemorrhage. He was monitored in neurosurgical ICU. A conservative approach was taken with careful clinical monitoring. The patient did well and was stable enough for discharge after 3 days. He did not require surgery for his fracture or for the intracranial hemorrhage.  However more concerning, the patient's story sounds like he had a syncopal episode. He was working trying to load a go onto a trailer. He does not remember falling. He is suddenly lost consciousness, and fell and struck his face on a pallet. This is how he sustained the intracranial hemorrhage as well as the maxillary fracture. He was unconscious for several minutes after the fall.  The fall was witnessed by a friend. It is difficult to ascertain, but the patient seems to have lost consciousness prior to the fall.  There was no witnessed seizure activity.  He also continues to complain of severe weakness and fatigue. This is been an ongoing problem. CBC CMP TSH have all been normal he does have low testosterone but this has not been treated do to an elevated PSA.  I have obtained a chest x-ray as well as an echocardiogram which did not reveal a cause of his fatigue. Past Medical History  Diagnosis Date  . Hypertension   . OA (osteoarthritis)     rt leg  . Metabolic syndrome   . ED (erectile dysfunction)   . Chronic fatigue   . Gout   . Bowing of leg   . Spinal stenosis   . Prediabetes   . Prostate cancer    Current Outpatient Prescriptions on File Prior to Visit  Medication Sig Dispense Refill  . amLODipine (NORVASC) 10 MG tablet Take 10 mg by mouth every morning.      . Cyanocobalamin (VITAMIN B12 PO) Take 1 tablet by mouth every morning.       . diclofenac sodium (VOLTAREN) 1 % GEL Apply 4 g topically 4  (four) times daily as needed. Pain      . doxycycline (VIBRA-TABS) 100 MG tablet Take 1 tablet (100 mg total) by mouth every 12 (twelve) hours.  40 tablet  0  . fenofibrate (TRICOR) 145 MG tablet Take 145 mg by mouth daily.      . fish oil-omega-3 fatty acids 1000 MG capsule Take 2 g by mouth every morning.      . fluticasone (FLONASE) 50 MCG/ACT nasal spray Place 2 sprays into the nose daily.  16 g  0  . furosemide (LASIX) 20 MG tablet Take 1 tablet (20 mg total) by mouth every morning.  30 tablet  5  . Menthol-Methyl Salicylate (MUSCLE RUB) 10-15 % CREA Apply 1 application topically daily as needed (FOR PAIN).      Marland Kitchen metoprolol (LOPRESSOR) 100 MG tablet Take 100 mg by mouth every morning.       . predniSONE (DELTASONE) 20 MG tablet Take 3 tablets (60 mg total) by mouth daily with breakfast.  27 tablet  0  . sildenafil (VIAGRA) 50 MG tablet Take 50 mg by mouth daily as needed.      . traMADol (ULTRAM) 50 MG tablet Take 1 tablet (50 mg total) by mouth every 6 (six) hours as needed for pain. For pain  40 tablet  0   No  current facility-administered medications on file prior to visit.   No Known Allergies History   Social History  . Marital Status: Divorced    Spouse Name: N/A    Number of Children: N/A  . Years of Education: N/A   Occupational History  . Not on file.   Social History Main Topics  . Smoking status: Never Smoker   . Smokeless tobacco: Not on file  . Alcohol Use: No  . Drug Use: No  . Sexually Active: Not on file   Other Topics Concern  . Not on file   Social History Narrative  . No narrative on file      Review of Systems  Constitutional: Positive for fatigue. Negative for fever and unexpected weight change.  Respiratory: Negative for apnea, cough, choking, chest tightness, shortness of breath, wheezing and stridor.   Cardiovascular: Positive for palpitations. Negative for chest pain and leg swelling.  Endocrine: Negative for cold intolerance and heat  intolerance.  All other systems reviewed and are negative.       Objective:   Physical Exam  Vitals reviewed. Constitutional: He is oriented to person, place, and time. He appears well-developed and well-nourished.  HENT:  Head: Normocephalic.  Mouth/Throat: Oropharynx is clear and moist. No oropharyngeal exudate.  Eyes: Conjunctivae and EOM are normal. No scleral icterus.  Neck: Neck supple. No JVD present. No thyromegaly present.  Cardiovascular: Normal rate, regular rhythm and normal heart sounds.  Exam reveals no gallop and no friction rub.   No murmur heard. Pulmonary/Chest: Effort normal and breath sounds normal. No respiratory distress. He has no wheezes. He has no rales. He exhibits no tenderness.  Abdominal: Soft. Bowel sounds are normal. He exhibits no distension and no mass. There is no tenderness. There is no rebound and no guarding.  Lymphadenopathy:    He has no cervical adenopathy.  Neurological: He is alert and oriented to person, place, and time. He displays normal reflexes. No cranial nerve deficit. He exhibits normal muscle tone. Coordination normal.  Skin: Skin is warm. No rash noted. No pallor.  Psychiatric: He has a normal mood and affect. His behavior is normal. Judgment and thought content normal.          Assessment & Plan:  1. Syncope I am concerned the patient had a syncopal episode. I will consult cardiology for possible stress test as well as a one-month event monitor to rule out cardiac arrhythmias.  The patient had no episodes of arrhythmia on telemetry while he was in hospital for 72 hours.  His previous echocardiogram has been normal.   - Ambulatory referral to Cardiology  2. Other malaise and fatigue See problem #1.  I believe the patient needs an evaluation for syncope first. The next step in the workup for fatigue, would be an evaluation for hidden malignancies. This will require CAT scans of the chest abdomen and pelvis along with  endoscopy.

## 2012-12-29 ENCOUNTER — Ambulatory Visit (INDEPENDENT_AMBULATORY_CARE_PROVIDER_SITE_OTHER): Payer: Medicare Other | Admitting: Family Medicine

## 2012-12-29 ENCOUNTER — Encounter: Payer: Self-pay | Admitting: Family Medicine

## 2012-12-29 VITALS — BP 130/64 | HR 56 | Temp 98.0°F | Resp 16 | Wt 217.0 lb

## 2012-12-29 DIAGNOSIS — M7989 Other specified soft tissue disorders: Secondary | ICD-10-CM

## 2012-12-29 NOTE — Progress Notes (Signed)
Subjective:    Patient ID: [name], male    DOB: 07-21-1929, 79 y.o.   MRN: [id number]  HPI Patient reports 2 days of leg swelling. He denies any shortness of breath. He denies any dyspnea on exertion. He denies any chest pain. He denies any increased salt intake. He does take Norvasc for his blood pressure. He is also on Lasix 20 mg by mouth daily for swelling. His history of chronic venous stasis dermatitis and chronic leg swelling. Is recently worse. He reports a tightness in his legs. He reports a pins and needles like sensation in the skin due to the swelling. There is no erythema. There is no warmth. There is no pain in his legs. Past Medical History  Diagnosis Date  . Hypertension   . OA (osteoarthritis)     rt leg  . Metabolic syndrome   . ED (erectile dysfunction)   . Chronic fatigue   . Gout   . Bowing of leg   . Spinal stenosis   . Prediabetes   . Prostate cancer    Current Outpatient Prescriptions on File Prior to Visit  Medication Sig Dispense Refill  . amLODipine (NORVASC) 10 MG tablet Take 10 mg by mouth every morning.      . Cyanocobalamin (VITAMIN B12 PO) Take 1 tablet by mouth every morning.       . diclofenac sodium (VOLTAREN) 1 % GEL Apply 4 g topically 4 (four) times daily as needed. Pain      . doxycycline (VIBRA-TABS) 100 MG tablet Take 1 tablet (100 mg total) by mouth every 12 (twelve) hours.  40 tablet  0  . fenofibrate (TRICOR) 145 MG tablet Take 145 mg by mouth daily.      . fish oil-omega-3 fatty acids 1000 MG capsule Take 2 g by mouth every morning.      . fluticasone (FLONASE) 50 MCG/ACT nasal spray Place 2 sprays into the nose daily.  16 g  0  . furosemide (LASIX) 20 MG tablet Take 1 tablet (20 mg total) by mouth every morning.  30 tablet  5  . Menthol-Methyl Salicylate (MUSCLE RUB) 10-15 % CREA Apply 1 application topically daily as needed (FOR PAIN).      Marland Kitchen metoprolol (LOPRESSOR) 100 MG tablet Take 100 mg by mouth every morning.       .  predniSONE (DELTASONE) 20 MG tablet Take 3 tablets (60 mg total) by mouth daily with breakfast.  27 tablet  0  . sildenafil (VIAGRA) 50 MG tablet Take 50 mg by mouth daily as needed.      . traMADol (ULTRAM) 50 MG tablet Take 1 tablet (50 mg total) by mouth every 6 (six) hours as needed for pain. For pain  40 tablet  0   No current facility-administered medications on file prior to visit.   No Known Allergies    Review of Systems  All other systems reviewed and are negative.       Objective:   Physical Exam  Vitals reviewed. Cardiovascular: Normal rate, regular rhythm, normal heart sounds and intact distal pulses.   No murmur heard. Pulmonary/Chest: Effort normal and breath sounds normal. No respiratory distress. He has no wheezes. He has no rales. He exhibits no tenderness.  Abdominal: Soft. Bowel sounds are normal. He exhibits no distension. There is no tenderness. There is no rebound.    +1 edema in both legs to the level of the midshin. There are chronic venous stasis changes in the skin  on both shins.  There are varicose veins. There is no erythema. There is no warmth.      Assessment & Plan:  1. Leg swelling There is no evidence of congestive heart failure on exam. He has no JVD. I asked the patient to discontinue amlodipine. Asked him to increase Lasix to 40 mg by mouth daily. He is to come back in 72 hours to let me recheck his legs. He is to return sooner if it is getting worse.

## 2012-12-30 ENCOUNTER — Telehealth: Payer: Self-pay | Admitting: Family Medicine

## 2012-12-30 MED ORDER — FUROSEMIDE 20 MG PO TABS: 20.0000 mg | ORAL_TABLET | Freq: Every morning | ORAL | Status: DC

## 2012-12-30 MED ORDER — TRAMADOL HCL 50 MG PO TABS: 50.0000 mg | ORAL_TABLET | Freq: Four times a day (QID) | ORAL | Status: DC | PRN

## 2012-12-30 NOTE — Telephone Encounter (Signed)
i am fine with tramadol and lasix but why is he on chronic doxycycline?

## 2012-12-30 NOTE — Telephone Encounter (Signed)
..  Rx Refilled Tramadol and Furosemide

## 2012-12-30 NOTE — Telephone Encounter (Signed)
I do not know why he is on chronic doxy. I tried to ask him that yesterday and all he would say is that his urologist put him on it and he is suppose to stay on.

## 2012-12-30 NOTE — Telephone Encounter (Signed)
?   OK to Refill  

## 2012-12-31 ENCOUNTER — Telehealth: Payer: Self-pay | Admitting: Family Medicine

## 2012-12-31 MED ORDER — FUROSEMIDE 40 MG PO TABS: 40.0000 mg | ORAL_TABLET | Freq: Every day | ORAL | Status: DC

## 2012-12-31 NOTE — Telephone Encounter (Signed)
Lets give 1 refill and try to get records from urology to ensure this is correct.  I believe he may have misunderstood.

## 2012-12-31 NOTE — Telephone Encounter (Signed)
Refilled med it was suppose to be 40mg  lasix order corrected

## 2013-01-01 ENCOUNTER — Encounter: Payer: Self-pay | Admitting: Family Medicine

## 2013-01-01 ENCOUNTER — Ambulatory Visit (INDEPENDENT_AMBULATORY_CARE_PROVIDER_SITE_OTHER): Payer: Medicare Other | Admitting: Family Medicine

## 2013-01-01 VITALS — BP 120/58 | HR 44 | Temp 98.1°F | Resp 16 | Wt 216.0 lb

## 2013-01-01 DIAGNOSIS — I498 Other specified cardiac arrhythmias: Secondary | ICD-10-CM

## 2013-01-01 DIAGNOSIS — R001 Bradycardia, unspecified: Secondary | ICD-10-CM

## 2013-01-01 DIAGNOSIS — M7989 Other specified soft tissue disorders: Secondary | ICD-10-CM

## 2013-01-01 NOTE — Progress Notes (Signed)
Subjective:    Patient ID: [name], male    DOB: 1929-04-19, 79 y.o.   MRN: [id number]  HPI 12/29/12 Patient reports 2 days of leg swelling. He denies any shortness of breath. He denies any dyspnea on exertion. He denies any chest pain. He denies any increased salt intake. He does take Norvasc for his blood pressure. He is also on Lasix 20 mg by mouth daily for swelling. His history of chronic venous stasis dermatitis and chronic leg swelling. Is recently worse. He reports a tightness in his legs. He reports a pins and needles like sensation in the skin due to the swelling. There is no erythema. There is no warmth. There is no pain in his legs. Diagnosed with: 1. Leg swelling There is no evidence of congestive heart failure on exam. He has no JVD. I asked the patient to discontinue amlodipine. Asked him to increase Lasix to 40 mg by mouth daily. He is to come back in 72 hours to let me recheck his legs. He is to return sooner if it is getting worse.  01/01/13 Swelling is much better today. He has decreased edema. He denies shortness of breath. His heart rate is extremely low at 44. He is taking Lopressor 100 mg by mouth daily. He is also on Lasix 40 mg by mouth daily. He's continued to hold the Norvasc. Past Medical History  Diagnosis Date  . Hypertension   . OA (osteoarthritis)     rt leg  . Metabolic syndrome   . ED (erectile dysfunction)   . Chronic fatigue   . Gout   . Bowing of leg   . Spinal stenosis   . Prediabetes   . Prostate cancer    Current Outpatient Prescriptions on File Prior to Visit  Medication Sig Dispense Refill  . amLODipine (NORVASC) 10 MG tablet Take 10 mg by mouth every morning.      . Cyanocobalamin (VITAMIN B12 PO) Take 1 tablet by mouth every morning.       . diclofenac sodium (VOLTAREN) 1 % GEL Apply 4 g topically 4 (four) times daily as needed. Pain      . doxycycline (VIBRA-TABS) 100 MG tablet Take 1 tablet (100 mg total) by mouth every 12 (twelve)  hours.  40 tablet  0  . fenofibrate (TRICOR) 145 MG tablet Take 145 mg by mouth daily.      . fish oil-omega-3 fatty acids 1000 MG capsule Take 2 g by mouth every morning.      . fluticasone (FLONASE) 50 MCG/ACT nasal spray Place 2 sprays into the nose daily.  16 g  0  . furosemide (LASIX) 40 MG tablet Take 1 tablet (40 mg total) by mouth daily.  30 tablet  5  . Menthol-Methyl Salicylate (MUSCLE RUB) 10-15 % CREA Apply 1 application topically daily as needed (FOR PAIN).      Marland Kitchen metoprolol (LOPRESSOR) 100 MG tablet Take 100 mg by mouth every morning.       . predniSONE (DELTASONE) 20 MG tablet Take 3 tablets (60 mg total) by mouth daily with breakfast.  27 tablet  0  . sildenafil (VIAGRA) 50 MG tablet Take 50 mg by mouth daily as needed.      . traMADol (ULTRAM) 50 MG tablet Take 1 tablet (50 mg total) by mouth every 6 (six) hours as needed for pain. For pain  40 tablet  0   No current facility-administered medications on file prior to visit.   No Known  Allergies    Review of Systems  All other systems reviewed and are negative.       Objective:   Physical Exam  Vitals reviewed. Cardiovascular: Normal rate, regular rhythm, normal heart sounds and intact distal pulses.   No murmur heard. Pulmonary/Chest: Effort normal and breath sounds normal. No respiratory distress. He has no wheezes. He has no rales. He exhibits no tenderness.  Abdominal: Soft. Bowel sounds are normal. He exhibits no distension. There is no tenderness. There is no rebound.    +1 edema in both legs to the level of the midshin. There are chronic venous stasis changes in the skin on both shins.  There are varicose veins. There is no erythema. There is no warmth.      Assessment & Plan:  1. Leg swelling There is no evidence of congestive heart failure on exam. He has no JVD. I asked the patient to permanently discontinue amlodipine. Asked him to continue Lasix to 40 mg by mouth daily.  2. Bradycardia- decreased  metabolite 50 mg by mouth daily. Recheck blood pressure and heart rate in one week

## 2013-01-08 ENCOUNTER — Ambulatory Visit (INDEPENDENT_AMBULATORY_CARE_PROVIDER_SITE_OTHER): Payer: Medicare Other | Admitting: Family Medicine

## 2013-01-08 ENCOUNTER — Encounter: Payer: Self-pay | Admitting: Family Medicine

## 2013-01-08 VITALS — BP 130/80 | HR 62 | Resp 18

## 2013-01-08 DIAGNOSIS — M7989 Other specified soft tissue disorders: Secondary | ICD-10-CM

## 2013-01-08 MED ORDER — FUROSEMIDE 40 MG PO TABS: 80.0000 mg | ORAL_TABLET | Freq: Every day | ORAL | Status: DC

## 2013-01-08 NOTE — Progress Notes (Signed)
Subjective:    Patient ID: [name], male    DOB: Mar 04, 1929, 79 y.o.   MRN: [id number]  HPI  Patient walked in for blood pressure check and then requested to be seen. He's continuing to complain of swelling in his legs. He has 1+ bipedal edema to the knee bilaterally.  He has discontinued amlodipine. He is currently taking Lasix 40 mg by mouth daily. He denies any shortness of breath or dyspnea on exertion. There is no evidence of congestive heart failure on his exam today. His heart rate has improved since we decreased the metoprolol. Past Medical History  Diagnosis Date  . Hypertension   . OA (osteoarthritis)     rt leg  . Metabolic syndrome   . ED (erectile dysfunction)   . Chronic fatigue   . Gout   . Bowing of leg   . Spinal stenosis   . Prediabetes   . Prostate cancer    Current Outpatient Prescriptions on File Prior to Visit  Medication Sig Dispense Refill  . fluticasone (FLONASE) 50 MCG/ACT nasal spray Place 2 sprays into the nose daily.  16 g  0  . Menthol-Methyl Salicylate (MUSCLE RUB) 10-15 % CREA Apply 1 application topically daily as needed (FOR PAIN).      Marland Kitchen metoprolol (LOPRESSOR) 100 MG tablet Take 50 mg by mouth 2 (two) times daily.       Marland Kitchen amLODipine (NORVASC) 10 MG tablet Take 10 mg by mouth every morning.      . Cyanocobalamin (VITAMIN B12 PO) Take 1 tablet by mouth every morning.       . diclofenac sodium (VOLTAREN) 1 % GEL Apply 4 g topically 4 (four) times daily as needed. Pain      . doxycycline (VIBRA-TABS) 100 MG tablet Take 1 tablet (100 mg total) by mouth every 12 (twelve) hours.  40 tablet  0  . fenofibrate (TRICOR) 145 MG tablet Take 145 mg by mouth daily.      . fish oil-omega-3 fatty acids 1000 MG capsule Take 2 g by mouth every morning.      . predniSONE (DELTASONE) 20 MG tablet Take 3 tablets (60 mg total) by mouth daily with breakfast.  27 tablet  0  . sildenafil (VIAGRA) 50 MG tablet Take 50 mg by mouth daily as needed.      . traMADol  (ULTRAM) 50 MG tablet Take 1 tablet (50 mg total) by mouth every 6 (six) hours as needed for pain. For pain  40 tablet  0   No current facility-administered medications on file prior to visit.   No Known Allergies   Review of Systems  All other systems reviewed and are negative.       Objective:   Physical Exam  Vitals reviewed. Neck: Neck supple. No JVD present.  Cardiovascular: Normal rate, regular rhythm and normal heart sounds.   No murmur heard. Pulmonary/Chest: Effort normal and breath sounds normal. No respiratory distress. He has no wheezes. He has no rales.  Abdominal: Soft. Bowel sounds are normal.   +1 edema in both legs to the knee. There is chronic venous stasis changes to the skin on both shins.        Assessment & Plan:  1. Leg swelling Increase Lasix to 80 mg a day. Recheck the patient on Tuesday. Continue Lopressor 50 mg by mouth twice a day. We will need to recheck his potassium on Tuesday as well as his kidney function. He has recently had an echocardiogram  of his heart in April which revealed an ejection fraction of 65% but findings consistent with left ventricular diastolic dysfunction.

## 2013-01-12 ENCOUNTER — Encounter: Payer: Self-pay | Admitting: Family Medicine

## 2013-01-12 ENCOUNTER — Ambulatory Visit (INDEPENDENT_AMBULATORY_CARE_PROVIDER_SITE_OTHER): Payer: Medicare Other | Admitting: Family Medicine

## 2013-01-12 VITALS — BP 142/68 | HR 60 | Temp 98.4°F | Resp 16 | Wt 214.0 lb

## 2013-01-12 DIAGNOSIS — M7989 Other specified soft tissue disorders: Secondary | ICD-10-CM

## 2013-01-12 NOTE — Progress Notes (Signed)
Subjective:    Patient ID: [name], male    DOB: 1928/08/21, 79 y.o.   MRN: [id number]  HPI  01/08/13  Patient walked in for blood pressure check and then requested to be seen. He's continuing to complain of swelling in his legs. He has 1+ bipedal edema to the knee bilaterally.  He has discontinued amlodipine. He is currently taking Lasix 40 mg by mouth daily. He denies any shortness of breath or dyspnea on exertion. There is no evidence of congestive heart failure on his exam today. His heart rate has improved since we decreased the metoprolol.  Therefore, I diagnosed him with: 1. Leg swelling Increase Lasix to 80 mg a day. Recheck the patient on Tuesday. Continue Lopressor 50 mg by mouth twice a day. We will need to recheck his potassium on Tuesday as well as his kidney function. He has recently had an echocardiogram of his heart in April which revealed an ejection fraction of 65% but findings consistent with left ventricular diastolic dysfunction.  01/12/13 His weight is down 2 pounds. Swelling in his legs is slightly improved. He is taking Lasix 80 mg by mouth daily. He denies any chest pain or shortness of breath Past Medical History  Diagnosis Date  . Hypertension   . OA (osteoarthritis)     rt leg  . Metabolic syndrome   . ED (erectile dysfunction)   . Chronic fatigue   . Gout   . Bowing of leg   . Spinal stenosis   . Prediabetes   . Prostate cancer    Current Outpatient Prescriptions on File Prior to Visit  Medication Sig Dispense Refill  . Cyanocobalamin (VITAMIN B12 PO) Take 1 tablet by mouth every morning.       . diclofenac sodium (VOLTAREN) 1 % GEL Apply 4 g topically 4 (four) times daily as needed. Pain      . fenofibrate (TRICOR) 145 MG tablet Take 145 mg by mouth daily.      . fish oil-omega-3 fatty acids 1000 MG capsule Take 2 g by mouth every morning.      . fluticasone (FLONASE) 50 MCG/ACT nasal spray Place 2 sprays into the nose daily.  16 g  0  .  furosemide (LASIX) 40 MG tablet Take 2 tablets (80 mg total) by mouth daily.  30 tablet  5  . Menthol-Methyl Salicylate (MUSCLE RUB) 10-15 % CREA Apply 1 application topically daily as needed (FOR PAIN).      Marland Kitchen metoprolol (LOPRESSOR) 100 MG tablet Take 50 mg by mouth 2 (two) times daily.       . sildenafil (VIAGRA) 50 MG tablet Take 50 mg by mouth daily as needed.      . traMADol (ULTRAM) 50 MG tablet Take 1 tablet (50 mg total) by mouth every 6 (six) hours as needed for pain. For pain  40 tablet  0   No current facility-administered medications on file prior to visit.    No Known Allergies   Review of Systems  All other systems reviewed and are negative.       Objective:   Physical Exam  Vitals reviewed. Neck: Neck supple. No JVD present.  Cardiovascular: Normal rate, regular rhythm and normal heart sounds.   No murmur heard. Pulmonary/Chest: Effort normal and breath sounds normal. No respiratory distress. He has no wheezes. He has no rales.  Abdominal: Soft. Bowel sounds are normal.   +1 edema in both legs to the knee. There is chronic venous stasis  changes to the skin on both shins.        Assessment & Plan:  1. Leg swelling Decrease Lasix to 40 mg a day to avoid dehydration. He can take an extra 40 mg by mouth daily when necessary leg swelling. Also gave the patient prescription for knee-high compression stockings graded15-20 mm of mercury. I advised him to wear these on a daily basis.

## 2013-02-12 ENCOUNTER — Encounter: Payer: Self-pay | Admitting: Cardiology

## 2013-02-12 ENCOUNTER — Ambulatory Visit (INDEPENDENT_AMBULATORY_CARE_PROVIDER_SITE_OTHER): Payer: Medicare Other | Admitting: Cardiology

## 2013-02-12 VITALS — BP 130/68 | HR 50 | Ht 70.0 in | Wt 212.1 lb

## 2013-02-12 DIAGNOSIS — I272 Pulmonary hypertension, unspecified: Secondary | ICD-10-CM

## 2013-02-12 DIAGNOSIS — I2789 Other specified pulmonary heart diseases: Secondary | ICD-10-CM

## 2013-02-12 MED ORDER — FUROSEMIDE 40 MG PO TABS: 60.0000 mg | ORAL_TABLET | Freq: Every day | ORAL | Status: DC

## 2013-02-12 NOTE — Patient Instructions (Signed)
Your physician recommends that you schedule a follow-up appointment in: AS NEEDED  Your physician recommends that you continue on your current medications as directed. Please refer to the Current Medication list given to you today.  

## 2013-02-12 NOTE — Progress Notes (Signed)
HPI The patient has no prior cardiac history. However, he does have a history of lower extremity swelling. He's been managed with this recently with diuretics. Norvasc that he was on was stopped. He's been wearing compression stockings. He has been noted to have some elevated pulmonary pressures on recent echo but well preserved right ventricular and left ventricular function.  He did have a chest x-ray which showed some chronic lung disease. He has bradycardia. However, he says that he feels well. He works in his garden. He denies any chest pressure, neck or arm discomfort. He doesn't notice any palpitations, presyncope or syncope. He denies any PND or orthopnea.  No Known Allergies  Current Outpatient Prescriptions  Medication Sig Dispense Refill  . Cyanocobalamin (VITAMIN B12 PO) Take 1 tablet by mouth every morning.       . diclofenac sodium (VOLTAREN) 1 % GEL Apply 4 g topically 4 (four) times daily as needed. Pain      . fenofibrate (TRICOR) 145 MG tablet Take 145 mg by mouth daily.      . fish oil-omega-3 fatty acids 1000 MG capsule Take 2 g by mouth every morning.      . fluticasone (FLONASE) 50 MCG/ACT nasal spray Place 2 sprays into the nose daily.  16 g  0  . Menthol-Methyl Salicylate (MUSCLE RUB) 10-15 % CREA Apply 1 application topically daily as needed (FOR PAIN).      Marland Kitchen metoprolol (LOPRESSOR) 100 MG tablet Take 50 mg by mouth 2 (two) times daily.       . sildenafil (VIAGRA) 50 MG tablet Take 50 mg by mouth daily as needed.      . traMADol (ULTRAM) 50 MG tablet Take 1 tablet (50 mg total) by mouth every 6 (six) hours as needed for pain. For pain  40 tablet  0  . furosemide (LASIX) 40 MG tablet Take 1.5 tablets (60 mg total) by mouth daily.  30 tablet  5   No current facility-administered medications for this visit.    Past Medical History  Diagnosis Date  . Hypertension   . OA (osteoarthritis)     rt leg  . Metabolic syndrome   . ED (erectile dysfunction)   . Chronic  fatigue   . Gout   . Bowing of leg   . Spinal stenosis   . Prediabetes   . Prostate cancer     Past Surgical History  Procedure Laterality Date  . Back surgery x 2    . Rt rotator cuff repair      No family history on file.  History   Social History  . Marital Status: Divorced    Spouse Name: N/A    Number of Children: N/A  . Years of Education: N/A   Occupational History  . Not on file.   Social History Main Topics  . Smoking status: Never Smoker   . Smokeless tobacco: Not on file  . Alcohol Use: No  . Drug Use: No  . Sexually Active: Not on file   Other Topics Concern  . Not on file   Social History Narrative  . No narrative on file    ROS: some difficulty swallowing, urinary frequency, leg swelling. Otherwise as stated in the HPI and negative for all other systems.  PHYSICAL EXAM BP 130/68  Pulse 50  Ht 5\' 10"  (1.778 m)  Wt 212 lb 1.9 oz (96.217 kg)  BMI 30.44 kg/m2 GENERAL:  Well appearing HEENT:  Pupils equal round and reactive,  fundi not visualized, oral mucosa unremarkable, dentures NECK:  No jugular venous distention, waveform within normal limits, carotid upstroke brisk and symmetric, no bruits, no thyromegaly LYMPHATICS:  No cervical, inguinal adenopathy LUNGS:  Clear to auscultation bilaterally BACK:  No CVA tenderness CHEST:  Unremarkable HEART:  PMI not displaced or sustained,S1 and S2 within normal limits, no S3, no S4, no clicks, no rubs, no murmurs ABD:  Flat, positive bowel sounds normal in frequency in pitch, no bruits, no rebound, no guarding, no midline pulsatile mass, no hepatomegaly, no splenomegaly EXT:  2 plus pulses throughout, moderate bilateral lower extremity edema, no cyanosis no clubbing SKIN:  No rashes no nodules NEURO:  Cranial nerves II through XII grossly intact, motor grossly intact throughout PSYCH:  Cognitively intact, oriented to person place and time   EKG:  Sinus bradycardia, rate 50, axis within normal limits,  intervals within normal limits, no acute ST-T wave changes.  02/12/2013  ASSESSMENT AND PLAN  EDEMA:  This is moderate but well managed with conservative therapy. This is likely related in part at least to the elevated pulmonary pressures. However, given the absence of acute symptoms I would manage this conservatively.  PULMONARY HTN:  This may be related to some chronic lung disease or even some sleep apnea as he does have some snoring and daytime fatigue. However, this is again not overtly symptomatic. I think his relatively low risk for chronic thromboembolic disease although I would repeat an echocardiogram perhaps in a year and if his pulmonary pressures are elevated above her readings were he has more symptoms I would pursue this diagnosis with a VQ scan.  HTN:  His blood pressure is well controlled. He will continue the meds as listed

## 2013-02-15 ENCOUNTER — Ambulatory Visit (INDEPENDENT_AMBULATORY_CARE_PROVIDER_SITE_OTHER): Payer: Medicare Other | Admitting: Family Medicine

## 2013-02-15 ENCOUNTER — Encounter: Payer: Self-pay | Admitting: Family Medicine

## 2013-02-15 VITALS — BP 150/60 | HR 56 | Temp 97.2°F | Resp 18 | Wt 211.0 lb

## 2013-02-15 DIAGNOSIS — M109 Gout, unspecified: Secondary | ICD-10-CM

## 2013-02-15 MED ORDER — PREDNISONE 20 MG PO TABS: ORAL_TABLET | ORAL | Status: DC

## 2013-02-15 MED ORDER — METHYLPREDNISOLONE ACETATE 40 MG/ML IJ SUSP
60.0000 mg | Freq: Once | INTRAMUSCULAR | Status: AC
  Administered 2013-02-15: 60 mg via INTRAMUSCULAR

## 2013-02-15 NOTE — Progress Notes (Signed)
  Subjective:    Patient ID: [name], male    DOB: [birth year], 79 y.o.   MRN: [id number]  HPI  Presents with 2 days of severe pain in his right first MTP joint.  The joint is red warm and swollen. In her stool or move the joint. He has a past medical history of gout.  He states it felt like his previous gout flares. Past Medical History  Diagnosis Date  . Hypertension   . OA (osteoarthritis)     rt leg  . ED (erectile dysfunction)   . Chronic fatigue   . Gout   . Bowing of leg   . Spinal stenosis   . Prostate cancer    Current Outpatient Prescriptions on File Prior to Visit  Medication Sig Dispense Refill  . Cyanocobalamin (VITAMIN B12 PO) Take 1 tablet by mouth every morning.       . diclofenac sodium (VOLTAREN) 1 % GEL Apply 4 g topically 4 (four) times daily as needed. Pain      . fenofibrate (TRICOR) 145 MG tablet Take 145 mg by mouth daily.      . fish oil-omega-3 fatty acids 1000 MG capsule Take 2 g by mouth every morning.      . fluticasone (FLONASE) 50 MCG/ACT nasal spray Place 2 sprays into the nose daily.  16 g  0  . furosemide (LASIX) 40 MG tablet Take 1.5 tablets (60 mg total) by mouth daily.  30 tablet  5  . Menthol-Methyl Salicylate (MUSCLE RUB) 10-15 % CREA Apply 1 application topically daily as needed (FOR PAIN).      Marland Kitchen metoprolol (LOPRESSOR) 100 MG tablet Take 50 mg by mouth 2 (two) times daily.       . sildenafil (VIAGRA) 50 MG tablet Take 50 mg by mouth daily as needed.      . traMADol (ULTRAM) 50 MG tablet Take 1 tablet (50 mg total) by mouth every 6 (six) hours as needed for pain. For pain  40 tablet  0   No current facility-administered medications on file prior to visit.   No Known Allergies History   Social History  . Marital Status: Divorced    Spouse Name: N/A    Number of Children: 4  . Years of Education: N/A   Occupational History  .     Social History Main Topics  . Smoking status: Never Smoker   . Smokeless tobacco: Not on file  .  Alcohol Use: No  . Drug Use: No  . Sexually Active: Not on file   Other Topics Concern  . Not on file   Social History Narrative   Four adopted children.  Lives alone.      Review of Systems  All other systems reviewed and are negative.       Objective:   Physical Exam  Vitals reviewed. Cardiovascular: Normal rate and regular rhythm.   Pulmonary/Chest: Effort normal and breath sounds normal.   right first MTP joint is red, warm, swollen, and tender with rom.        Assessment & Plan:  1. Gout attack Depo-Medrol 60 mg IM x1. Begin prednisone Dosepak tomorrow.  Recheck in 48 hours if no better, sooner if worse. - predniSONE (DELTASONE) 20 MG tablet; 3 tabs poqday 1-2, 2 tabs poqday 3-4, 1 tab poqday 5-6  Dispense: 12 tablet; Refill: 0 - methylPREDNISolone acetate (DEPO-MEDROL) injection 60 mg; Inject 1.5 mLs (60 mg total) into the muscle once.

## 2013-02-18 ENCOUNTER — Institutional Professional Consult (permissible substitution): Payer: Medicare Other | Admitting: Cardiology

## 2013-02-27 ENCOUNTER — Telehealth: Payer: Self-pay | Admitting: Family Medicine

## 2013-02-27 MED ORDER — TRAMADOL HCL 50 MG PO TABS: 50.0000 mg | ORAL_TABLET | Freq: Four times a day (QID) | ORAL | Status: DC | PRN

## 2013-02-27 NOTE — Telephone Encounter (Signed)
Rx Refilled  

## 2013-03-01 ENCOUNTER — Telehealth: Payer: Self-pay | Admitting: Family Medicine

## 2013-03-01 MED ORDER — FLUTICASONE PROPIONATE 50 MCG/ACT NA SUSP: 2.0000 | Freq: Every day | NASAL | Status: DC

## 2013-03-01 MED ORDER — FENOFIBRATE 145 MG PO TABS: 145.0000 mg | ORAL_TABLET | Freq: Every day | ORAL | Status: DC

## 2013-03-01 NOTE — Telephone Encounter (Signed)
Rx Refilled  

## 2013-04-01 ENCOUNTER — Encounter: Payer: Self-pay | Admitting: Family Medicine

## 2013-04-01 ENCOUNTER — Ambulatory Visit (INDEPENDENT_AMBULATORY_CARE_PROVIDER_SITE_OTHER): Payer: Medicare Other | Admitting: Family Medicine

## 2013-04-01 VITALS — BP 130/70 | HR 68 | Temp 98.2°F | Resp 16 | Wt 210.0 lb

## 2013-04-01 DIAGNOSIS — R319 Hematuria, unspecified: Secondary | ICD-10-CM

## 2013-04-01 MED ORDER — OXYCODONE-ACETAMINOPHEN 5-325 MG PO TABS: 1.0000 | ORAL_TABLET | Freq: Four times a day (QID) | ORAL | Status: DC | PRN

## 2013-04-01 MED ORDER — TAMSULOSIN HCL 0.4 MG PO CAPS: 0.4000 mg | ORAL_CAPSULE | Freq: Every day | ORAL | Status: DC

## 2013-04-01 NOTE — Progress Notes (Signed)
Subjective:    Patient ID: [name], male    DOB: [birth year], 79 y.o.   MRN: [id number]  HPI  Beginning this afternoon, the patient has experienced 2 episodes of painless gross hematuria. The urine has the color and clarity of "red Kool-Aid."  He denies dysuria, frequency, urgency, hesitancy, or Back pain. He denies nausea, vomiting, diarrhea, constipation. He denies any melena or hematochezia. He denies any easy bruising or epistaxis. He has no fevers or chills. He is completely asymptomatic except for the hematuria. Past Medical History  Diagnosis Date  . Hypertension   . OA (osteoarthritis)     rt leg  . ED (erectile dysfunction)   . Chronic fatigue   . Gout   . Bowing of leg   . Spinal stenosis   . Prostate cancer    Past Surgical History  Procedure Laterality Date  . Back surgery x 2    . Rt rotator cuff repair    . Cervical spine surgery     Current Outpatient Prescriptions on File Prior to Visit  Medication Sig Dispense Refill  . Cyanocobalamin (VITAMIN B12 PO) Take 1 tablet by mouth every morning.       . diclofenac sodium (VOLTAREN) 1 % GEL Apply 4 g topically 4 (four) times daily as needed. Pain      . fenofibrate (TRICOR) 145 MG tablet Take 1 tablet (145 mg total) by mouth daily.  30 tablet  5  . fish oil-omega-3 fatty acids 1000 MG capsule Take 2 g by mouth every morning.      . fluticasone (FLONASE) 50 MCG/ACT nasal spray Place 2 sprays into the nose daily.  16 g  11  . furosemide (LASIX) 40 MG tablet Take 1.5 tablets (60 mg total) by mouth daily.  30 tablet  5  . Menthol-Methyl Salicylate (MUSCLE RUB) 10-15 % CREA Apply 1 application topically daily as needed (FOR PAIN).      Marland Kitchen metoprolol (LOPRESSOR) 100 MG tablet Take 50 mg by mouth 2 (two) times daily.       . sildenafil (VIAGRA) 50 MG tablet Take 50 mg by mouth daily as needed.      . traMADol (ULTRAM) 50 MG tablet Take 1 tablet (50 mg total) by mouth every 6 (six) hours as needed for pain. For pain  40  tablet  0   No current facility-administered medications on file prior to visit.   No Known Allergies History   Social History  . Marital Status: Divorced    Spouse Name: N/A    Number of Children: 4  . Years of Education: N/A   Occupational History  .     Social History Main Topics  . Smoking status: Never Smoker   . Smokeless tobacco: Not on file  . Alcohol Use: No  . Drug Use: No  . Sexual Activity: Not on file   Other Topics Concern  . Not on file   Social History Narrative   Four adopted children.  Lives alone.      Review of Systems  All other systems reviewed and are negative.       Objective:   Physical Exam  Vitals reviewed. Cardiovascular: Normal rate, regular rhythm and normal heart sounds.   Pulmonary/Chest: Effort normal and breath sounds normal. No respiratory distress. He has no wheezes. He has no rales.  Abdominal: Soft. Bowel sounds are normal. He exhibits no distension. There is no tenderness. There is no rebound and no guarding.  Assessment & Plan:  .1. Hematuria Patient walked into the clinic at 5:20.  The LAB had already closed.  Therefore I was unable to obtain a CBC, BMP, or urinalysis. However the patient's history suggests that he may be starting to pass a kidney stone. I feel that nephrolithiasis is the most likely cause for his painless gross hematuria. I will begin the patient on Flomax 0.4 mg by mouth daily until stone passes. I also gave him a prescription for Percocet 5/325 one by mouth every 6 hours when necessary pain if his pain develops. Also recommended he push fluids the evening. Tomorrow I will schedule a CT of the abdomen and pelvis to evaluate for possible kidney stone.  If the patient's symptoms worsen he is to go to the emergency room at night. - tamsulosin (FLOMAX) 0.4 MG CAPS capsule; Take 1 capsule (0.4 mg total) by mouth daily.  Dispense: 30 capsule; Refill: 3 - oxyCODONE-acetaminophen (ROXICET) 5-325 MG per  tablet; Take 1 tablet by mouth every 6 (six) hours as needed for pain.  Dispense: 20 tablet; Refill: 0

## 2013-04-02 ENCOUNTER — Other Ambulatory Visit: Payer: Self-pay | Admitting: Family Medicine

## 2013-04-02 ENCOUNTER — Ambulatory Visit (HOSPITAL_COMMUNITY)
Admission: RE | Admit: 2013-04-02 | Discharge: 2013-04-02 | Disposition: A | Discharge status: Home / Self Care | Payer: Medicare Other | Source: Ambulatory Visit | Attending: Family Medicine | Admitting: Family Medicine

## 2013-04-02 DIAGNOSIS — R319 Hematuria, unspecified: Secondary | ICD-10-CM

## 2013-04-02 LAB — CREATININE, SERUM
Creatinine, Ser: 1.11 mg/dL (ref 0.50–1.35)
GFR calc Af Amer: 70 mL/min — ABNORMAL LOW (ref >90)
GFR calc non Af Amer: 60 mL/min — ABNORMAL LOW (ref >90)

## 2013-04-02 MED ORDER — IOHEXOL 300 MG/ML  SOLN: 125.0000 mL | Freq: Once | INTRAMUSCULAR | Status: AC | PRN

## 2013-04-03 ENCOUNTER — Inpatient Hospital Stay (HOSPITAL_COMMUNITY): Admission: RE | Admit: 2013-04-03 | Payer: Medicare Other | Source: Ambulatory Visit

## 2013-04-03 ENCOUNTER — Telehealth: Payer: Self-pay | Admitting: Family Medicine

## 2013-04-03 ENCOUNTER — Ambulatory Visit (HOSPITAL_COMMUNITY)
Admission: RE | Admit: 2013-04-03 | Discharge: 2013-04-03 | Disposition: A | Discharge status: Home / Self Care | Payer: Medicare Other | Source: Ambulatory Visit | Attending: Family Medicine | Admitting: Family Medicine

## 2013-04-03 DIAGNOSIS — K5730 Diverticulosis of large intestine without perforation or abscess without bleeding: Secondary | ICD-10-CM | POA: Insufficient documentation

## 2013-04-03 DIAGNOSIS — R319 Hematuria, unspecified: Secondary | ICD-10-CM | POA: Insufficient documentation

## 2013-04-03 DIAGNOSIS — K8020 Calculus of gallbladder without cholecystitis without obstruction: Secondary | ICD-10-CM | POA: Insufficient documentation

## 2013-04-03 MED ORDER — IOHEXOL 300 MG/ML  SOLN
125.0000 mL | Freq: Once | INTRAMUSCULAR | Status: AC | PRN
  Administered 2013-04-03: 125 mL via INTRAVENOUS

## 2013-04-03 NOTE — Telephone Encounter (Signed)
LVM on home and cell phone CT no acute findings No stone seen  BPH, no diverticulitis. Advised to continue flomax Go to ER if severe abdominal pain or more bleeding  We can f/u Tuesday

## 2013-04-05 ENCOUNTER — Other Ambulatory Visit (HOSPITAL_COMMUNITY): Payer: Medicare Other

## 2013-04-06 ENCOUNTER — Other Ambulatory Visit: Payer: Self-pay | Admitting: Family Medicine

## 2013-04-06 DIAGNOSIS — R310 Gross hematuria: Secondary | ICD-10-CM

## 2013-04-13 ENCOUNTER — Other Ambulatory Visit: Payer: Self-pay | Admitting: Family Medicine

## 2013-04-13 NOTE — Telephone Encounter (Signed)
?   OK to Refill  

## 2013-04-13 NOTE — Telephone Encounter (Signed)
ok 

## 2013-05-18 ENCOUNTER — Other Ambulatory Visit: Payer: Self-pay | Admitting: Family Medicine

## 2013-05-18 NOTE — Telephone Encounter (Signed)
okw

## 2013-05-18 NOTE — Telephone Encounter (Signed)
?   OK to Refill  

## 2013-06-16 ENCOUNTER — Ambulatory Visit (INDEPENDENT_AMBULATORY_CARE_PROVIDER_SITE_OTHER): Payer: Medicare Other | Admitting: Family Medicine

## 2013-06-16 ENCOUNTER — Encounter: Payer: Self-pay | Admitting: Family Medicine

## 2013-06-16 VITALS — BP 140/80 | HR 86 | Temp 97.9°F | Resp 17 | Ht 64.0 in | Wt 210.0 lb

## 2013-06-16 DIAGNOSIS — I10 Essential (primary) hypertension: Secondary | ICD-10-CM

## 2013-06-16 DIAGNOSIS — R609 Edema, unspecified: Secondary | ICD-10-CM

## 2013-06-16 DIAGNOSIS — M1990 Unspecified osteoarthritis, unspecified site: Secondary | ICD-10-CM

## 2013-06-16 MED ORDER — TRAMADOL HCL 50 MG PO TABS: ORAL_TABLET | ORAL | Status: DC

## 2013-06-16 NOTE — Patient Instructions (Signed)
Increase water pill to 1 tablet twice a day for 3 days Refill your tramadol for pain Keep legs elevated Biotin products for dry mouth  F/U Dr. Tanya Nones

## 2013-06-17 ENCOUNTER — Encounter: Payer: Self-pay | Admitting: Family Medicine

## 2013-06-17 NOTE — Assessment & Plan Note (Signed)
Prescription for compression hose Increase lasix to 40mg  BID x 3 days

## 2013-06-17 NOTE — Progress Notes (Signed)
  Subjective:    Patient ID: [name], male    DOB: 1928-11-07, 79 y.o.   MRN: [id number]  HPI  Pt here to f/u pain medications. Has known history of spinal stenosis, OA and chronic peripheral edema. He is maintained on lasix for edema though he is taking a different dose 20mg  BID, instead of the 60mg  daily noted in last cardiology note. His legs have been swollen for past week or so and more painful to touch. Denies missing any doses of meds, denies CP, SOB. He does where compression socks, but has not picked up professional grade He is also out of his ultram which helps his legs, told he needed appt by pharmacy for refill.  Note he did have URI, has been taking and OTC decongestant from [facility] that he brought with him today.   Review of Systems  GEN- denies fatigue, fever, weight loss,weakness, recent illness HEENT- denies eye drainage, change in vision, nasal discharge, CVS- denies chest pain, palpitations RESP- denies SOB, cough, wheeze MSK- + joint pain, muscle aches, injury Neuro- denies headache, dizziness, syncope, seizure activity      Objective:   Physical Exam GEN- NAD, alert and oriented x3 HEENT- PERRL, EOMI, non injected sclera, pink conjunctiva, MMM, oropharynx clear Neck- Supple, no JVD CVS- RRR, no murmur RESP-CTAB MSK- Spine NT, neg SLR, moving LE equally EXT- 1+ pitting edema, ttp over Lower ext, no calf tenderness, chronic venous stasis changes Pulses- Radial, DP- decreased bilat        Assessment & Plan:

## 2013-06-17 NOTE — Assessment & Plan Note (Signed)
Ultram refilled for chronic pain

## 2013-06-17 NOTE — Assessment & Plan Note (Signed)
BP looks okay, advised to stop taking OTC decongestants, discussed SE on his heart and BP He can use nasal saline for nose

## 2013-06-29 ENCOUNTER — Encounter: Payer: Self-pay | Admitting: Family Medicine

## 2013-06-29 ENCOUNTER — Encounter (HOSPITAL_COMMUNITY): Payer: Self-pay | Admitting: Emergency Medicine

## 2013-06-29 ENCOUNTER — Ambulatory Visit (INDEPENDENT_AMBULATORY_CARE_PROVIDER_SITE_OTHER): Payer: Medicare Other | Admitting: Family Medicine

## 2013-06-29 ENCOUNTER — Emergency Department (HOSPITAL_COMMUNITY): Payer: Medicare Other

## 2013-06-29 ENCOUNTER — Inpatient Hospital Stay (HOSPITAL_COMMUNITY)
Admission: EM | Admit: 2013-06-29 | Discharge: 2013-07-03 | DRG: 293 | Disposition: A | Discharge status: Home / Self Care | Payer: Medicare Other | Attending: Internal Medicine | Admitting: Internal Medicine

## 2013-06-29 VITALS — BP 154/90 | HR 77 | Temp 97.6°F | Resp 16 | Ht 64.0 in | Wt 226.0 lb

## 2013-06-29 DIAGNOSIS — R7303 Prediabetes: Secondary | ICD-10-CM

## 2013-06-29 DIAGNOSIS — R5382 Chronic fatigue, unspecified: Secondary | ICD-10-CM

## 2013-06-29 DIAGNOSIS — W19XXXD Unspecified fall, subsequent encounter: Secondary | ICD-10-CM

## 2013-06-29 DIAGNOSIS — R0609 Other forms of dyspnea: Secondary | ICD-10-CM

## 2013-06-29 DIAGNOSIS — Z87891 Personal history of nicotine dependence: Secondary | ICD-10-CM

## 2013-06-29 DIAGNOSIS — Z23 Encounter for immunization: Secondary | ICD-10-CM

## 2013-06-29 DIAGNOSIS — I5033 Acute on chronic diastolic (congestive) heart failure: Secondary | ICD-10-CM

## 2013-06-29 DIAGNOSIS — M4800 Spinal stenosis, site unspecified: Secondary | ICD-10-CM

## 2013-06-29 DIAGNOSIS — I10 Essential (primary) hypertension: Secondary | ICD-10-CM

## 2013-06-29 DIAGNOSIS — R600 Localized edema: Secondary | ICD-10-CM

## 2013-06-29 DIAGNOSIS — R296 Repeated falls: Secondary | ICD-10-CM

## 2013-06-29 DIAGNOSIS — IMO0001 Reserved for inherently not codable concepts without codable children: Secondary | ICD-10-CM

## 2013-06-29 DIAGNOSIS — R609 Edema, unspecified: Secondary | ICD-10-CM

## 2013-06-29 DIAGNOSIS — I509 Heart failure, unspecified: Secondary | ICD-10-CM

## 2013-06-29 DIAGNOSIS — S069X0D Unspecified intracranial injury without loss of consciousness, subsequent encounter: Secondary | ICD-10-CM

## 2013-06-29 DIAGNOSIS — I2789 Other specified pulmonary heart diseases: Secondary | ICD-10-CM | POA: Diagnosis present

## 2013-06-29 DIAGNOSIS — I272 Pulmonary hypertension, unspecified: Secondary | ICD-10-CM

## 2013-06-29 DIAGNOSIS — E8881 Metabolic syndrome: Secondary | ICD-10-CM

## 2013-06-29 DIAGNOSIS — M21169 Varus deformity, not elsewhere classified, unspecified knee: Secondary | ICD-10-CM

## 2013-06-29 DIAGNOSIS — M109 Gout, unspecified: Secondary | ICD-10-CM

## 2013-06-29 DIAGNOSIS — J329 Chronic sinusitis, unspecified: Secondary | ICD-10-CM

## 2013-06-29 DIAGNOSIS — E785 Hyperlipidemia, unspecified: Secondary | ICD-10-CM | POA: Diagnosis present

## 2013-06-29 DIAGNOSIS — Z8249 Family history of ischemic heart disease and other diseases of the circulatory system: Secondary | ICD-10-CM

## 2013-06-29 DIAGNOSIS — C61 Malignant neoplasm of prostate: Secondary | ICD-10-CM

## 2013-06-29 DIAGNOSIS — R1310 Dysphagia, unspecified: Secondary | ICD-10-CM

## 2013-06-29 DIAGNOSIS — M1990 Unspecified osteoarthritis, unspecified site: Secondary | ICD-10-CM

## 2013-06-29 DIAGNOSIS — Z9181 History of falling: Secondary | ICD-10-CM

## 2013-06-29 DIAGNOSIS — Z79899 Other long term (current) drug therapy: Secondary | ICD-10-CM

## 2013-06-29 DIAGNOSIS — N529 Male erectile dysfunction, unspecified: Secondary | ICD-10-CM

## 2013-06-29 DIAGNOSIS — R0602 Shortness of breath: Secondary | ICD-10-CM

## 2013-06-29 LAB — CBC WITH DIFFERENTIAL/PLATELET
Basophils Absolute: 0.1 10*3/uL (ref 0.0–0.1)
Basophils Relative: 1 % (ref 0–1)
Eosinophils Relative: 6 % — ABNORMAL HIGH (ref 0–5)
HCT: 39.2 % (ref 39.0–52.0)
Hemoglobin: 13.5 g/dL (ref 13.0–17.0)
Lymphs Abs: 1.3 10*3/uL (ref 0.7–4.0)
MCHC: 34.4 g/dL (ref 30.0–36.0)
Monocytes Absolute: 0.4 10*3/uL (ref 0.1–1.0)
Neutro Abs: 3.1 10*3/uL (ref 1.7–7.7)
Neutrophils Relative %: 60 % (ref 43–77)
Platelets: 119 10*3/uL — ABNORMAL LOW (ref 150–400)
RDW: 13.7 % (ref 11.5–15.5)
WBC: 5.1 10*3/uL (ref 4.0–10.5)

## 2013-06-29 LAB — COMPREHENSIVE METABOLIC PANEL
ALT: 12 U/L (ref 0–53)
AST: 25 U/L (ref 0–37)
Albumin: 3.3 g/dL — ABNORMAL LOW (ref 3.5–5.2)
Calcium: 9.1 mg/dL (ref 8.4–10.5)
Chloride: 106 mEq/L (ref 96–112)
Creatinine, Ser: 1.03 mg/dL (ref 0.50–1.35)
Sodium: 142 mEq/L (ref 135–145)

## 2013-06-29 LAB — URINALYSIS, ROUTINE W REFLEX MICROSCOPIC
Bilirubin Urine: NEGATIVE
Leukocytes, UA: NEGATIVE
Nitrite: NEGATIVE
Protein, ur: NEGATIVE mg/dL
Specific Gravity, Urine: 1.014 (ref 1.005–1.030)
Urobilinogen, UA: 1 mg/dL (ref 0.0–1.0)

## 2013-06-29 LAB — PRO B NATRIURETIC PEPTIDE: Pro B Natriuretic peptide (BNP): 2336 pg/mL — ABNORMAL HIGH (ref 0–450)

## 2013-06-29 LAB — TROPONIN I: Troponin I: <0.3 ng/mL (ref <0.30)

## 2013-06-29 LAB — PROTIME-INR
INR: 1.37 (ref 0.00–1.49)
Prothrombin Time: 16.5 seconds — ABNORMAL HIGH (ref 11.6–15.2)

## 2013-06-29 MED ORDER — ASPIRIN EC 81 MG PO TBEC
81.0000 mg | DELAYED_RELEASE_TABLET | Freq: Every day | ORAL | Status: DC
  Administered 2013-06-30 – 2013-07-03 (×4): 81 mg via ORAL
  Filled 2013-06-29 (×5): qty 1

## 2013-06-29 MED ORDER — SODIUM CHLORIDE 0.9 % IV SOLN: 250.0000 mL | INTRAVENOUS | Status: DC | PRN

## 2013-06-29 MED ORDER — FUROSEMIDE 10 MG/ML IJ SOLN
40.0000 mg | Freq: Once | INTRAMUSCULAR | Status: AC
  Administered 2013-06-29: 40 mg via INTRAVENOUS
  Filled 2013-06-29: qty 4

## 2013-06-29 MED ORDER — SODIUM CHLORIDE 0.9 % IJ SOLN: 3.0000 mL | INTRAMUSCULAR | Status: DC | PRN

## 2013-06-29 MED ORDER — ASPIRIN 81 MG PO CHEW
324.0000 mg | CHEWABLE_TABLET | Freq: Once | ORAL | Status: AC
  Administered 2013-06-29: 324 mg via ORAL
  Filled 2013-06-29: qty 4

## 2013-06-29 MED ORDER — METOPROLOL TARTRATE 50 MG PO TABS
50.0000 mg | ORAL_TABLET | Freq: Two times a day (BID) | ORAL | Status: DC
  Administered 2013-06-29 – 2013-07-03 (×8): 50 mg via ORAL
  Filled 2013-06-29 (×9): qty 1

## 2013-06-29 MED ORDER — HEPARIN SODIUM (PORCINE) 5000 UNIT/ML IJ SOLN
5000.0000 [IU] | Freq: Three times a day (TID) | INTRAMUSCULAR | Status: DC
  Administered 2013-06-29 – 2013-07-03 (×12): 5000 [IU] via SUBCUTANEOUS
  Filled 2013-06-29 (×15): qty 1

## 2013-06-29 MED ORDER — PNEUMOCOCCAL VAC POLYVALENT 25 MCG/0.5ML IJ INJ
0.5000 mL | INJECTION | INTRAMUSCULAR | Status: AC
  Administered 2013-06-30: 0.5 mL via INTRAMUSCULAR
  Filled 2013-06-29: qty 0.5

## 2013-06-29 MED ORDER — ONDANSETRON HCL 4 MG/2ML IJ SOLN: 4.0000 mg | Freq: Four times a day (QID) | INTRAMUSCULAR | Status: DC | PRN

## 2013-06-29 MED ORDER — LISINOPRIL 2.5 MG PO TABS
2.5000 mg | ORAL_TABLET | Freq: Every day | ORAL | Status: DC
  Administered 2013-06-29 – 2013-06-30 (×2): 2.5 mg via ORAL
  Filled 2013-06-29 (×2): qty 1

## 2013-06-29 MED ORDER — FENOFIBRATE 54 MG PO TABS
54.0000 mg | ORAL_TABLET | Freq: Every day | ORAL | Status: DC
  Administered 2013-06-29 – 2013-07-03 (×5): 54 mg via ORAL
  Filled 2013-06-29 (×5): qty 1

## 2013-06-29 MED ORDER — FUROSEMIDE 10 MG/ML IJ SOLN
40.0000 mg | Freq: Two times a day (BID) | INTRAMUSCULAR | Status: DC
  Administered 2013-06-29 – 2013-07-02 (×7): 40 mg via INTRAVENOUS
  Filled 2013-06-29 (×9): qty 4

## 2013-06-29 MED ORDER — SODIUM CHLORIDE 0.9 % IJ SOLN
3.0000 mL | Freq: Two times a day (BID) | INTRAMUSCULAR | Status: DC
  Administered 2013-06-29 – 2013-06-30 (×4): 3 mL via INTRAVENOUS

## 2013-06-29 MED ORDER — TAMSULOSIN HCL 0.4 MG PO CAPS
0.4000 mg | ORAL_CAPSULE | Freq: Every day | ORAL | Status: DC
  Administered 2013-06-29 – 2013-07-03 (×5): 0.4 mg via ORAL
  Filled 2013-06-29 (×5): qty 1

## 2013-06-29 MED ORDER — ACETAMINOPHEN 325 MG PO TABS
650.0000 mg | ORAL_TABLET | ORAL | Status: DC | PRN
  Administered 2013-07-02: 650 mg via ORAL
  Filled 2013-06-29: qty 2

## 2013-06-29 NOTE — ED Notes (Signed)
Ambulated pt in the hall. Pt was able to ambulate with minimal assistance. Pt stayed at 94%-96% on room air.

## 2013-06-29 NOTE — H&P (Signed)
[facility] History and Physical  [name] OZH:[id number] DOB: 26-Mar-1929 DOA: 06/29/2013  Referring physician: Dr. Manus Gunning PCP: Leo Grosser, MD  Specialists: none  Chief Complaint: Worsening shortness of breath  HPI: [name] is a 79 y.o. male has a past medical history significant for hypertension, hyperlipidemia, congestive heart failure with preserved systolic function, presents from the PCPs office with couple weeks of history of shortness of breath, leg swelling and weight gain of about 16 pounds. He denies any chest pain or palpitations. He denies any cough or sputum production. He shortness of breath is getting worse with exertion. He denies medication noncompliance. He states that he "doesn't add much salt" to his food, however is not checking regularly sodium content of what he eats. He denies any abdominal pain, nausea vomiting or diarrhea. He denies any lightheadedness or dizziness. He endorses orthopnea. In the emergency room, blood work remarkable for an elevated BNP, clinical exam consistent with fluid overload, and a chest x-ray that showed cardiomegaly and vascular congestion with trace bilateral effusions.  Review of Systems: Aspirin history of present illness, otherwise negative  Past Medical History  Diagnosis Date  . Hypertension   . OA (osteoarthritis)     rt leg  . ED (erectile dysfunction)   . Chronic fatigue   . Gout   . Bowing of leg   . Spinal stenosis   . Prostate cancer    Past Surgical History  Procedure Laterality Date  . Back surgery x 2    . Rt rotator cuff repair    . Cervical spine surgery     Social History:  reports that he has never smoked. He does not have any smokeless tobacco history on file. He reports that he does not drink alcohol or use illicit drugs.  No Known Allergies  Family History  Problem Relation Age of Onset  . Heart failure Mother 36   Prior to Admission medications   Medication Sig Start Date  End Date Taking? Authorizing Provider  Cyanocobalamin (VITAMIN B12 PO) Take 1 tablet by mouth every morning.    Yes Historical Provider, MD  diclofenac sodium (VOLTAREN) 1 % GEL Apply 4 g topically 4 (four) times daily as needed. Pain   Yes Historical Provider, MD  fenofibrate (TRICOR) 145 MG tablet Take 1 tablet (145 mg total) by mouth daily. 03/01/13  Yes Donita Brooks, MD  fish oil-omega-3 fatty acids 1000 MG capsule Take 2 g by mouth every morning.   Yes Historical Provider, MD  fluticasone (FLONASE) 50 MCG/ACT nasal spray Place 2 sprays into the nose daily. 03/01/13  Yes Donita Brooks, MD  furosemide (LASIX) 40 MG tablet Take 20 mg by mouth 2 (two) times daily. 02/12/13  Yes Rollene Rotunda, MD  Menthol-Methyl Salicylate (MUSCLE RUB) 10-15 % CREA Apply 1 application topically daily as needed (FOR PAIN).   Yes Historical Provider, MD  metoprolol (LOPRESSOR) 100 MG tablet Take 50 mg by mouth 2 (two) times daily.    Yes Historical Provider, MD  tamsulosin (FLOMAX) 0.4 MG CAPS capsule Take 1 capsule (0.4 mg total) by mouth daily. 04/01/13  Yes Donita Brooks, MD  traMADol (ULTRAM) 50 MG tablet TAKE ONE TABLET EVERY SIX HOURS AS NEEDED FOR PAIN 06/16/13  Yes Salley Scarlet, MD  sildenafil (VIAGRA) 50 MG tablet Take 50 mg by mouth daily as needed.    Historical Provider, MD   Physical Exam: Filed Vitals:   06/29/13 1215 06/29/13 1230 06/29/13 1317 06/29/13 1353  BP: 161/68 165/78 155/84 201/85  Pulse:   68 88  Temp:      TempSrc:      Resp: 22 17 18 18   Weight:      SpO2:   96% 99%     General:  No apparent distress  Eyes: PERRL, EOMI, no scleral icterus  ENT: moist oropharynx  Neck: supple, JVD present  Cardiovascular: regular rate without MRG; 2+ peripheral pulses  Respiratory: Good air movement, no wheezing, bilateral crackles at the bases  Abdomen: soft, non tender to palpation, positive bowel sounds, no guarding, no rebound  Skin: no rashes  Musculoskeletal: 2+  pitting lower extremity edema  Psychiatric: normal mood and affect  Neurologic: CN 2-12 grossly intact, MS 5/5 in all 4  Labs on Admission:  Basic Metabolic Panel:  Recent Labs Lab 06/29/13 1210  NA 142  K 3.8  CL 106  CO2 25  GLUCOSE 109*  BUN 12  CREATININE 1.03  CALCIUM 9.1   Liver Function Tests:  Recent Labs Lab 06/29/13 1210  AST 25  ALT 12  ALKPHOS 45  BILITOT 0.9  PROT 6.5  ALBUMIN 3.3*   CBC:  Recent Labs Lab 06/29/13 1210  WBC 5.1  NEUTROABS 3.1  HGB 13.5  HCT 39.2  MCV 92.5  PLT 119*    BNP (last 3 results)  Recent Labs  06/29/13 1210  PROBNP 2336.0*   CBG: No results found for this basename: GLUCAP,  in the last 168 hours  Radiological Exams on Admission: Dg Chest Portable 1 View  06/29/2013   CLINICAL DATA:  Shortness of breath.  Weight gain.  EXAM:  PORTABLE CHEST - 1 VIEW  COMPARISON:  PA and lateral chest 11/04/2012.  FINDINGS: There is cardiomegaly and pulmonary vascular congestion. Trace bilateral pleural effusions are noted. No consolidative process or pneumothorax.  IMPRESSION: Cardiomegaly and vascular congestion with trace bilateral effusions.   Electronically Signed   By: Drusilla Kanner M.D.   On: 06/29/2013 13:37    EKG: Independently reviewed. Sinus rhythm with a few PVCs  Assessment/Plan Active Problems:   Hypertension   Frequent falls   Pulmonary HTN   Peripheral edema   CHF (congestive heart failure)   Dyspnea on exertion   Acute on chronic diastolic congestive heart failure  Acute on chronic diastolic heart failure - patient with evidence of fluid overload on exam, 2+ lower extremity pitting edema, crackles at the bases and JVD. His gain 16 pounds. He will be admitted on the heart failure pathway, place on IV Lasix, will start an ACE inhibitor and continue his home blocker. He will be on telemetry. repeat 2-D echocardiogram. Also cycled troponins. Shortness of breath - likely due to #1. Provide oxygen as needed.   History of frequent falls - we'll obtain a PT consult Hypertension - continue home medications. Suspect improvement in his blood pressure with diuresis also. - We'll obtain a lipid panel. He'll probably benefit from a statin.  Diet: heart  Fluids: none DVT Prophylaxis: heparin  Code Status: Full code   Family Communication: None   Disposition Plan: Inpatient   Time spent: 27   Costin M. Elvera Lennox, MD [facility] Pager [phone]  If 7PM-7AM, please contact night-coverage [email or web address] Password [email or web address] 06/29/2013, 2:29 PM

## 2013-06-29 NOTE — Progress Notes (Signed)
Pt rec from ED. Pt o4x, no complaints of CP or SOB. Pt appears restless comfort given. CHF education begun , HF booklet given. Pt states HF is new for him. Will continue to monitor

## 2013-06-29 NOTE — Patient Instructions (Signed)
Go directly to [facility] hospital- tell - Concern for CHF new onset, SOB, 16 pound weight gain

## 2013-06-29 NOTE — ED Provider Notes (Signed)
CSN: [id number]     Arrival date & time 06/29/13  1055 History   First MD Initiated Contact with Patient 06/29/13 1139     Chief Complaint  Patient presents with  . Shortness of Breath   (Consider location/radiation/quality/duration/timing/severity/associated sxs/prior Treatment) HPI Comments: Patient presents from PCPs office with 2 week history of shortness of breath, leg swelling and 16 pounds weight gain. Denies any chest pain, cough or fever. Nurse's swelling in his legs and 2 pillow orthopnea which is his baseline. Endorse dyspnea on exertion but denies any chest pain. He is on 40 mg of Lasix twice daily. He does not carry a diagnosis of CHF. Denies any back pain or abdominal pain.  The history is provided by the patient.    Past Medical History  Diagnosis Date  . Hypertension   . OA (osteoarthritis)     rt leg  . ED (erectile dysfunction)   . Chronic fatigue   . Gout   . Bowing of leg   . Spinal stenosis   . Prostate cancer   . CHF (congestive heart failure)    Past Surgical History  Procedure Laterality Date  . Back surgery x 2    . Rt rotator cuff repair    . Cervical spine surgery    . Back surgery     Family History  Problem Relation Age of Onset  . Heart failure Mother 45   History  Substance Use Topics  . Smoking status: Former Games developer  . Smokeless tobacco: Never Used     Comment: QUIT SMOKING  MANY YEARS AGO"  . Alcohol Use: No    Review of Systems  Constitutional: Negative for activity change and appetite change.  Respiratory: Positive for chest tightness and shortness of breath. Negative for cough.   Cardiovascular: Positive for leg swelling. Negative for chest pain.  Gastrointestinal: Negative for nausea, vomiting and abdominal pain.  Genitourinary: Negative for dysuria and hematuria.  Musculoskeletal: Negative for back pain.  Neurological: Positive for light-headedness. Negative for dizziness, weakness and headaches.    Allergies  Review of  patient's allergies indicates no known allergies.  Home Medications   No current outpatient prescriptions on file. BP 169/76  Pulse 62  Temp(Src) 97.8 F (36.6 C) (Oral)  Resp 18  Ht 5\' 10"  (1.778 m)  Wt 215 lb 13.3 oz (97.9 kg)  BMI 30.97 kg/m2  SpO2 95% Physical Exam  Constitutional: He appears well-developed and well-nourished.  HENT:  Head: Normocephalic and atraumatic.  Mouth/Throat: Oropharynx is clear and moist. No oropharyngeal exudate.  Eyes: Conjunctivae and EOM are normal. Pupils are equal, round, and reactive to light.  Neck: Normal range of motion.  Cardiovascular: Normal rate, regular rhythm and normal heart sounds.   Pulmonary/Chest: Effort normal and breath sounds normal. No respiratory distress.  Crackles at bases  Abdominal: Soft. There is no tenderness. There is no rebound and no guarding.  Musculoskeletal: Normal range of motion. He exhibits tenderness. He exhibits no edema.  +2 pretibial edema to knees  Neurological: He is alert.  Skin: Skin is warm.    ED Course  Procedures (including critical care time) Labs Review Labs Reviewed  CBC WITH DIFFERENTIAL - Abnormal; Notable for the following:    Platelets 119 (*)    Eosinophils Relative 6 (*)    All other components within normal limits  COMPREHENSIVE METABOLIC PANEL - Abnormal; Notable for the following:    Glucose, Bld 109 (*)    Albumin 3.3 (*)  GFR calc non Af Amer 66 (*)    GFR calc Af Amer 76 (*)    All other components within normal limits  PROTIME-INR - Abnormal; Notable for the following:    Prothrombin Time 16.5 (*)    All other components within normal limits  PRO B NATRIURETIC PEPTIDE - Abnormal; Notable for the following:    Pro B Natriuretic peptide (BNP) 2336.0 (*)    All other components within normal limits  URINALYSIS, ROUTINE W REFLEX MICROSCOPIC  TROPONIN I  TROPONIN I  TROPONIN I  TSH  BASIC METABOLIC PANEL  LIPID PANEL   Imaging Review Dg Chest Portable 1  View  06/29/2013   CLINICAL DATA:  Shortness of breath.  Weight gain.  EXAM:  PORTABLE CHEST - 1 VIEW  COMPARISON:  PA and lateral chest 11/04/2012.  FINDINGS: There is cardiomegaly and pulmonary vascular congestion. Trace bilateral pleural effusions are noted. No consolidative process or pneumothorax.  IMPRESSION: Cardiomegaly and vascular congestion with trace bilateral effusions.   Electronically Signed   By: Drusilla Kanner M.D.   On: 06/29/2013 13:37    EKG Interpretation    Date/Time:  Tuesday June 29 2013 11:03:15 EST Ventricular Rate:  87 PR Interval:  164 QRS Duration: 100 QT Interval:  400 QTC Calculation: 481 R Axis:   -3 Text Interpretation:  Sinus rhythm with occasional Premature ventricular complexes Cannot rule out Anteroseptal infarct , age undetermined Abnormal ECG now nsr with PVCs Confirmed by Manus Gunning  MD, Ezell Poke ([id number]) on 06/29/2013 12:00:24 PM            MDM   1. Dyspnea on exertion   2. CHF (congestive heart failure)    2 week history of SOB, weight gain, leg swelling.  No chest pain.  EKG nsr with PVCs.  Echocardiogram in April 2014 showed EF of 65% with mild diastolic dysfunction. Troponin negative CXR with mild congestion and effusions.  IV lasix and ASA given. On attempted ambulation, patient became dyspneic with saturations of 91% and increased work of breathing. Given age and comorbidities, will admit for IV diuresis.  Glynn Octave, MD 06/29/13 [phone]

## 2013-06-29 NOTE — ED Notes (Signed)
Pt sent here by PCP for eval of increased SOB and leg swelling x several days; pt sts concerned for CHF; pt denies CP

## 2013-06-29 NOTE — Assessment & Plan Note (Addendum)
He has gained 16 pounds in 2 weeks. He was previously maintained on 20 mg twice a day of Lasix. With his acute symptoms I think he warrants evaluation emergency room likely admission. He does not have congestive heart failure on his records but based on his exam today am concerned for this. I discussed with patient evaluation emergency room. He declined EMS transport and decided to take himself. He does not have any family members at home caring for him

## 2013-06-29 NOTE — Progress Notes (Signed)
Unit CM UR Completed by [facility] ED CM  W. Calieb Lichtman RN  

## 2013-06-29 NOTE — Progress Notes (Signed)
  Subjective:    Patient ID: [name], male    DOB: 02/13/1929, 79 y.o.   MRN: [id number]  HPI  Patient presents today with worsening shortness of breath. He states on Sunday he began noticing he was short of breath with little exertion. He also noticed some swelling and feels like she is bloated all over. He's been sleeping on 2 pillows at nighttime. She denies any chest pain. He has not been using any anti-inflammatories but taken his regular medications. He noticed that he has not been urinating as much with his Lasix. He went back to half a tablet twice a day which is what he was on previously. He did have good output when I placed him on Lasix 40 mg twice a day about 2 weeks ago. I reviewed his last cardiology note. Of note it states that he was on 60 mg but he was not taking this only 20 mg twice a day. He has a history of pulmonary hypertension but had a normal echocardiogram in April 2014.   Review of Systems  GEN- denies fatigue, fever, weight loss,weakness, recent illness HEENT- denies eye drainage, change in vision, nasal discharge, CVS- denies chest pain, palpitations, +leg swelling RESP-+ SOB, cough, wheeze ABD- denies N/V, change in stools, abd pain GU- denies dysuria, hematuria, dribbling, incontinence MSK- denies joint pain, muscle aches, injury Neuro- denies headache, dizziness, syncope, seizure activity      Objective:   Physical Exam GEN- NAD, alert and oriented x3 HEENT- PERRL, EOMI, non injected sclera, pink conjunctiva, MMM, oropharynx clear Neck- Supple, + JVD CVS- RRR, 3/5 SEM RESP-bilateral crackles to mid lungs, Increased WOB with little exertion, oxygen sat 97&, no rhonchi, EXT- 2+ pitting edema,  chronic venous stasis changes Pulses- Radial, DP- decreased bilat         Assessment & Plan:

## 2013-06-29 NOTE — ED Notes (Signed)
Patient said he takes "fluid pills" and he thinks they are not working.  He visited his PCP because he has gained 16 pounds in two weeks.  His PCP sent him here because he has increasing SOB and was told he has fluid around his heart.

## 2013-06-29 NOTE — Assessment & Plan Note (Signed)
Blood pressure elevated today I think this is secondary to fluid OVERLOAD

## 2013-06-29 NOTE — Assessment & Plan Note (Signed)
Worsening edema per above

## 2013-06-30 DIAGNOSIS — I509 Heart failure, unspecified: Secondary | ICD-10-CM

## 2013-06-30 DIAGNOSIS — I10 Essential (primary) hypertension: Secondary | ICD-10-CM

## 2013-06-30 DIAGNOSIS — I5033 Acute on chronic diastolic (congestive) heart failure: Principal | ICD-10-CM

## 2013-06-30 DIAGNOSIS — I359 Nonrheumatic aortic valve disorder, unspecified: Secondary | ICD-10-CM

## 2013-06-30 DIAGNOSIS — I2789 Other specified pulmonary heart diseases: Secondary | ICD-10-CM

## 2013-06-30 LAB — BASIC METABOLIC PANEL
BUN: 17 mg/dL (ref 6–23)
CO2: 26 mEq/L (ref 19–32)
Chloride: 103 mEq/L (ref 96–112)
GFR calc Af Amer: 66 mL/min — ABNORMAL LOW (ref >90)
Glucose, Bld: 102 mg/dL — ABNORMAL HIGH (ref 70–99)
Potassium: 3.6 mEq/L (ref 3.5–5.1)
Sodium: 140 mEq/L (ref 135–145)

## 2013-06-30 LAB — LIPID PANEL
Cholesterol: 137 mg/dL (ref 0–200)
HDL: 32 mg/dL — ABNORMAL LOW (ref >39)
LDL Cholesterol: 81 mg/dL (ref 0–99)
Total CHOL/HDL Ratio: 4.3 RATIO
Triglycerides: 120 mg/dL (ref <150)

## 2013-06-30 LAB — TSH: TSH: 0.767 u[IU]/mL (ref 0.350–4.500)

## 2013-06-30 MED ORDER — LISINOPRIL 5 MG PO TABS
5.0000 mg | ORAL_TABLET | Freq: Every day | ORAL | Status: DC
  Administered 2013-07-01 – 2013-07-03 (×3): 5 mg via ORAL
  Filled 2013-06-30 (×3): qty 1

## 2013-06-30 NOTE — Progress Notes (Signed)
PT Cancellation/Discharge Note  Patient Details Name: [name] MRN: [id number] DOB: 04/29/1929   Cancelled Treatment:    Reason Eval/Treat Not Completed: PT screened, no needs identified, will sign off.  Patient reports he has been up ambulating independently.  Reports he does not need PT services.  PT will sign off.   Vena Austria 06/30/2013, 11:28 AM Durenda Hurt. Renaldo Fiddler, [facility] Pager [phone]

## 2013-06-30 NOTE — Progress Notes (Signed)
[facility] PROGRESS NOTE Interim History: 79 y.o. male has a past medical history significant for hypertension, hyperlipidemia, congestive heart failure with preserved systolic function, presents from the PCPs office with couple weeks of history of shortness of breath, leg swelling and weight gain of about 16 pounds. He denies any chest pain or palpitations. He denies any cough or sputum production. He shortness of breath is getting worse with exertion. He denies medication noncompliance. He states that he "doesn't add much salt" to his food, however is not checking regularly sodium content of what he eats. He denies any abdominal pain, nausea vomiting or diarrhea. He denies any lightheadedness or dizziness. He endorses orthopnea  Filed Weights   06/29/13 1109 06/29/13 1544 06/30/13 0544  Weight: 102.331 kg (225 lb 9.6 oz) 97.9 kg (215 lb 13.3 oz) 95.845 kg (211 lb 4.8 oz)        Intake/Output Summary (Last 24 hours) at 06/30/13 1222 Last data filed at 06/30/13 1041  Gross per 24 hour  Intake   1070 ml  Output   4325 ml  Net  -3255 ml     Assessment/Plan: Acute on chronic diastolic congestive heart failure: - On IV lasix, good urine output. stricit I and O's. - Monitor electrolytes. Mild JVD, +1 lower extremity edema. - Cont ACE, Metoprolol and lasix. - Echo pending.   Frequent falls: - pt consult  Hypertension: - mildly high,  - increase ACE.    Code Status: Full code  Family Communication: None  Disposition Plan: Inpatient     Consultants:  none  Procedures: ECHO 4.1.2014: The estimated ejection fraction was 65%, Pulm HTN 52 mmhg  Antibiotics:  none  HPI/Subjective: Feels better  Objective: Filed Vitals:   06/29/13 2127 06/30/13 0039 06/30/13 0544 06/30/13 1028  BP: 137/61 135/66 137/67 163/70  Pulse: 58 56 56 62  Temp: 98.6 F (37 C) 97.3 F (36.3 C) 97.2 F (36.2 C) 97.6 F (36.4 C)  TempSrc: Oral Oral Oral Oral  Resp: 18 18 18 18    Height:      Weight:   95.845 kg (211 lb 4.8 oz)   SpO2: 94% 94% 94% 100%     Exam:  General: Alert, awake, oriented x3, in no acute distress.  HEENT: No bruits, no goiter. +JVD Heart: Regular rate and rhythm, lower extremity edema. Lungs: Good air movement, bilateral air movement.  Abdomen: Soft, nontender, nondistended, positive bowel sounds.  Neuro: Grossly intact, nonfocal.   Data Reviewed: Basic Metabolic Panel:  Recent Labs Lab 06/29/13 1210 06/30/13 0533  NA 142 140  K 3.8 3.6  CL 106 103  CO2 25 26  GLUCOSE 109* 102*  BUN 12 17  CREATININE 1.03 1.16  CALCIUM 9.1 9.2   Liver Function Tests:  Recent Labs Lab 06/29/13 1210  AST 25  ALT 12  ALKPHOS 45  BILITOT 0.9  PROT 6.5  ALBUMIN 3.3*   No results found for this basename: LIPASE, AMYLASE,  in the last 168 hours No results found for this basename: AMMONIA,  in the last 168 hours CBC:  Recent Labs Lab 06/29/13 1210  WBC 5.1  NEUTROABS 3.1  HGB 13.5  HCT 39.2  MCV 92.5  PLT 119*   Cardiac Enzymes:  Recent Labs Lab 06/29/13 1435 06/29/13 2000 06/30/13 0712  TROPONINI <0.30 <0.30 <0.30   BNP (last 3 results)  Recent Labs  06/29/13 1210  PROBNP 2336.0*   CBG: No results found for this basename: GLUCAP,  in the last 168 hours  No results found for this or any previous visit (from the past 240 hour(s)).   Studies: Dg Chest Portable 1 View  06/29/2013   CLINICAL DATA:  Shortness of breath.  Weight gain.  EXAM:  PORTABLE CHEST - 1 VIEW  COMPARISON:  PA and lateral chest 11/04/2012.  FINDINGS: There is cardiomegaly and pulmonary vascular congestion. Trace bilateral pleural effusions are noted. No consolidative process or pneumothorax.  IMPRESSION: Cardiomegaly and vascular congestion with trace bilateral effusions.   Electronically Signed   By: Drusilla Kanner M.D.   On: 06/29/2013 13:37    Scheduled Meds: . aspirin EC  81 mg Oral Daily  . fenofibrate  54 mg Oral Daily  .  furosemide  40 mg Intravenous BID  . heparin  5,000 Units Subcutaneous Q8H  . lisinopril  2.5 mg Oral Daily  . metoprolol  50 mg Oral BID  . sodium chloride  3 mL Intravenous Q12H  . tamsulosin  0.4 mg Oral Daily   Continuous Infusions:    Marinda Elk  [facility] Pager [phone] . If 8PM-8AM, please contact night-coverage at [email or web address], password [facility] 06/30/2013, 12:22 PM  LOS: 1 day

## 2013-06-30 NOTE — Progress Notes (Signed)
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 

## 2013-06-30 NOTE — Progress Notes (Signed)
Echo Lab  2D Echocardiogram completed.  Fantasy Donald L Myking Sar, RDCS 06/30/2013 10:10 AM

## 2013-06-30 NOTE — Progress Notes (Signed)
Patient evaluated for community based chronic disease management services with [facility] Care Management Program as a benefit of patient's [employer]. Spoke with patient at bedside to explain [facility] Care Management services.  Patient will receive a post discharge transition of care call and will be evaluated for monthly home visits for assessments and CHF disease process education.  Patient works closely with PCP to control CHF exacerbations but would benefit from closer monitoring of  Medication/dietary compliance.  Left contact information and [facility] literature at bedside. Made Inpatient Case Manager aware that [facility] Care Management following. Of note, [facility] Care Management services does not replace or interfere with any services that are arranged by inpatient case management or social work.  For additional questions or referrals please contact Anibal Henderson BSN RN [facility] Liaison at [phone].

## 2013-06-30 NOTE — Plan of Care (Signed)
Problem: Food- and Nutrition-Related Knowledge Deficit (NB-1.1) Goal: Nutrition education Formal process to instruct or train a patient/client in a skill or to impart knowledge to help patients/clients voluntarily manage or modify food choices and eating behavior to maintain or improve health. Outcome: Completed/Met Date Met:  06/30/13 Nutrition Education Note  RD consulted for nutrition education regarding CHF.  Patient reports previous education and states that he doesn't add salt to food. He reads the food label sometimes, but admits that he doesn't pay close attention to the sodium content of foods.   RD provided "Low Sodium Nutrition Therapy" handout from the Academy of Nutrition and Dietetics. Reviewed patient's dietary recall. Provided examples on ways to decrease sodium intake in diet. Discouraged intake of processed foods and use of salt shaker. Discussed food label reading to identify foods high in sodium.   RD discussed why it is important for patient to adhere to diet recommendations, and emphasized the role of fluids, foods to avoid, and importance of weighing self daily. Teach back method used.  Expect good compliance.  Body mass index is 30.32 kg/(m^2). Pt meets criteria for Obesity, Class I based on current BMI.  Current diet order is Heart Healthy, patient is consuming approximately 100% of meals at this time. Labs and medications reviewed. No further nutrition interventions warranted at this time. RD contact information provided. If additional nutrition issues arise, please re-consult RD.   Linnell Fulling, RD, LDN Pager #: [phone] After-Hours Pager #: [phone]

## 2013-07-01 DIAGNOSIS — M4800 Spinal stenosis, site unspecified: Secondary | ICD-10-CM

## 2013-07-01 DIAGNOSIS — Z9181 History of falling: Secondary | ICD-10-CM

## 2013-07-01 LAB — BASIC METABOLIC PANEL
BUN: 18 mg/dL (ref 6–23)
CO2: 25 mEq/L (ref 19–32)
Calcium: 9.1 mg/dL (ref 8.4–10.5)
Chloride: 101 mEq/L (ref 96–112)
Creatinine, Ser: 1.11 mg/dL (ref 0.50–1.35)
GFR calc Af Amer: 69 mL/min — ABNORMAL LOW (ref >90)
GFR calc non Af Amer: 60 mL/min — ABNORMAL LOW (ref >90)
Glucose, Bld: 98 mg/dL (ref 70–99)
Potassium: 3.5 mEq/L (ref 3.5–5.1)
Sodium: 138 mEq/L (ref 135–145)

## 2013-07-01 MED ORDER — POTASSIUM CHLORIDE CRYS ER 20 MEQ PO TBCR
40.0000 meq | EXTENDED_RELEASE_TABLET | Freq: Two times a day (BID) | ORAL | Status: AC
  Administered 2013-07-01 (×2): 40 meq via ORAL
  Filled 2013-07-01 (×2): qty 2

## 2013-07-01 MED ORDER — MENTHOL 3 MG MT LOZG
1.0000 | LOZENGE | OROMUCOSAL | Status: DC | PRN
  Administered 2013-07-01: 17:00:00 3 mg via ORAL
  Filled 2013-07-01: qty 9

## 2013-07-01 NOTE — Progress Notes (Signed)
[facility] PROGRESS NOTE Interim History: 79 y.o. male has a past medical history significant for hypertension, hyperlipidemia, congestive heart failure with preserved systolic function, presents from the PCPs office with couple weeks of history of shortness of breath, leg swelling and weight gain of about 16 pounds. He denies any chest pain or palpitations. He denies any cough or sputum production. He shortness of breath is getting worse with exertion. He denies medication noncompliance. He states that he "doesn't add much salt" to his food, however is not checking regularly sodium content of what he eats. He denies any abdominal pain, nausea vomiting or diarrhea. He denies any lightheadedness or dizziness. He endorses orthopnea  Filed Weights   06/29/13 1544 06/30/13 0544 07/01/13 0500  Weight: 97.9 kg (215 lb 13.3 oz) 95.845 kg (211 lb 4.8 oz) 93.985 kg (207 lb 3.2 oz)        Intake/Output Summary (Last 24 hours) at 07/01/13 0942 Last data filed at 07/01/13 0647  Gross per 24 hour  Intake    700 ml  Output   3400 ml  Net  -2700 ml     Assessment/Plan: Acute on chronic diastolic congestive heart failure: - Cont IV lasix, good urine output. stricit I and O's. - Monitor electrolytes. Mild JVD,  - Cont ACE, Metoprolol and lasix. - Echo 11.26.2014: as below. - cont daily weights.  Frequent falls: - pt consult  Hypertension: - mildly high,  - increase ACE.    Code Status: Full code  Family Communication: None  Disposition Plan: Inpatient     Consultants:  none  Procedures: Echo 11.26.2014: ejection fraction was in the range of 60% to 65%. Wall motion was normal; there were no regional wall motion abnormalities. Features are consistent with a pseudonormal left ventricular filling pattern, with concomitant abnormal relaxation and increased filling pressure (grade 2 diastolic Dysfunction.Right ventricle: The cavity size was moderately  dilated. Antibiotics:  none  HPI/Subjective: Feels better, able to lay flat  Objective: Filed Vitals:   06/30/13 1700 06/30/13 2006 07/01/13 0147 07/01/13 0500  BP: 155/72 146/73 126/53 148/64  Pulse: 60 69 56 60  Temp:  97.1 F (36.2 C)  97.2 F (36.2 C)  TempSrc:  Oral  Oral  Resp:  18 18 18   Height:      Weight:    93.985 kg (207 lb 3.2 oz)  SpO2:  94% 92% 95%     Exam:  General: Alert, awake, oriented x3, in no acute distress.  HEENT: No bruits, no goiter. +JVD Heart: Regular rate and rhythm, lower extremity edema. Lungs: Good air movement, bilateral air movement.  Abdomen: Soft, nontender, nondistended, positive bowel sounds.  Neuro: Grossly intact, nonfocal.   Data Reviewed: Basic Metabolic Panel:  Recent Labs Lab 06/29/13 1210 06/30/13 0533 07/01/13 0532  NA 142 140 138  K 3.8 3.6 3.5  CL 106 103 101  CO2 25 26 25   GLUCOSE 109* 102* 98  BUN 12 17 18   CREATININE 1.03 1.16 1.11  CALCIUM 9.1 9.2 9.1   Liver Function Tests:  Recent Labs Lab 06/29/13 1210  AST 25  ALT 12  ALKPHOS 45  BILITOT 0.9  PROT 6.5  ALBUMIN 3.3*   No results found for this basename: LIPASE, AMYLASE,  in the last 168 hours No results found for this basename: AMMONIA,  in the last 168 hours CBC:  Recent Labs Lab 06/29/13 1210  WBC 5.1  NEUTROABS 3.1  HGB 13.5  HCT 39.2  MCV 92.5  PLT 119*  Cardiac Enzymes:  Recent Labs Lab 06/29/13 1435 06/29/13 2000 06/30/13 0712  TROPONINI <0.30 <0.30 <0.30   BNP (last 3 results)  Recent Labs  06/29/13 1210  PROBNP 2336.0*   CBG: No results found for this basename: GLUCAP,  in the last 168 hours  No results found for this or any previous visit (from the past 240 hour(s)).   Studies: Dg Chest Portable 1 View  06/29/2013   CLINICAL DATA:  Shortness of breath.  Weight gain.  EXAM:  PORTABLE CHEST - 1 VIEW  COMPARISON:  PA and lateral chest 11/04/2012.  FINDINGS: There is cardiomegaly and pulmonary vascular  congestion. Trace bilateral pleural effusions are noted. No consolidative process or pneumothorax.  IMPRESSION: Cardiomegaly and vascular congestion with trace bilateral effusions.   Electronically Signed   By: Drusilla Kanner M.D.   On: 06/29/2013 13:37    Scheduled Meds: . aspirin EC  81 mg Oral Daily  . fenofibrate  54 mg Oral Daily  . furosemide  40 mg Intravenous BID  . heparin  5,000 Units Subcutaneous Q8H  . lisinopril  5 mg Oral Daily  . metoprolol  50 mg Oral BID  . potassium chloride  40 mEq Oral BID  . sodium chloride  3 mL Intravenous Q12H  . tamsulosin  0.4 mg Oral Daily   Continuous Infusions:    Marinda Elk  [facility] Pager [phone] . If 8PM-8AM, please contact night-coverage at [email or web address], password [facility] 07/01/2013, 9:42 AM  LOS: 2 days

## 2013-07-02 LAB — BASIC METABOLIC PANEL
BUN: 23 mg/dL (ref 6–23)
Calcium: 9.4 mg/dL (ref 8.4–10.5)
Chloride: 101 mEq/L (ref 96–112)
GFR calc Af Amer: 63 mL/min — ABNORMAL LOW (ref >90)
GFR calc non Af Amer: 54 mL/min — ABNORMAL LOW (ref >90)
Glucose, Bld: 102 mg/dL — ABNORMAL HIGH (ref 70–99)
Sodium: 135 mEq/L (ref 135–145)

## 2013-07-02 NOTE — Progress Notes (Signed)
   CARE MANAGEMENT NOTE 07/02/2013  Patient:  [name], [name]   Account Number:  [id number]  Date Initiated:  07/02/2013  Documentation initiated by:  Darlyne Russian  Subjective/Objective Assessment:   admitted with increasing SOB, lower leg swelling, weight gain     Action/Plan:   progression of care and discharge planning   Anticipated DC Date:     Anticipated DC Plan:  HOME W HOME HEALTH SERVICES      DC Planning Services  CM consult      [facility] Choice  HOME HEALTH   Choice offered to / List presented to:  C-1 Patient        HH arranged  HH-1 RN      [facility] agency  [facility]   Status of service:   Medicare Important Message given?   (If response is "NO", the following Medicare IM given date fields will be blank) Date Medicare IM given:   Date Additional Medicare IM given:    Discharge Disposition:    Per UR Regulation:    If discussed at Long Length of Stay Meetings, dates discussed:    Comments:  07/02/2013  [address] RN, [place]  [phone] CM referral: home health CHF program  Met with patient regarding home health services for CHF program.  [facility] to provide service. [facility]/ Lupita Leash called with referral for [facility] RN CHF program.

## 2013-07-02 NOTE — Progress Notes (Signed)
Patient to followed by [facility] Care Management upon hospital discharge for HF management. Patient could also benefit from [facility] RN for tele-monitoring as well. Left voicemail for inpatient RNCM regarding this as well as text page to MD to request tele-monitoring for [facility] RN if patient does indeed go home at discharge.  Raiford Noble, MSN-Ed, RN, BSN- [facility] [phone]

## 2013-07-02 NOTE — Progress Notes (Signed)
[facility] PROGRESS NOTE Interim History: 79 y.o. male has a past medical history significant for hypertension, hyperlipidemia, congestive heart failure with preserved systolic function, presents from the PCPs office with couple weeks of history of shortness of breath, leg swelling and weight gain of about 16 pounds. He denies any chest pain or palpitations. He denies any cough or sputum production. He shortness of breath is getting worse with exertion. He denies medication noncompliance. He states that he "doesn't add much salt" to his food, however is not checking regularly sodium content of what he eats. He denies any abdominal pain, nausea vomiting or diarrhea. He denies any lightheadedness or dizziness. He endorses orthopnea  Filed Weights   06/30/13 0544 07/01/13 0500 07/02/13 0555  Weight: 95.845 kg (211 lb 4.8 oz) 93.985 kg (207 lb 3.2 oz) 92.942 kg (204 lb 14.4 oz)        Intake/Output Summary (Last 24 hours) at 07/02/13 1610 Last data filed at 07/02/13 9604  Gross per 24 hour  Intake    720 ml  Output   2700 ml  Net  -1980 ml     Assessment/Plan: Acute on chronic diastolic congestive heart failure: - Cont IV lasix, good urine output. stricit I and O's. Able to sleep flat. - Monitor electrolytes. Mild JVD,  - Cont ACE, Metoprolol and lasix. - Echo 11.26.2014: as below. - cont daily weights.  Frequent falls: - pt consult  Hypertension: - mildly high,  - increase ACE.    Code Status: Full code  Family Communication: None  Disposition Plan: Inpatient     Consultants:  none  Procedures: Echo 11.26.2014: ejection fraction was in the range of 60% to 65%. Wall motion was normal; there were no regional wall motion abnormalities. Features are consistent with a pseudonormal left ventricular filling pattern, with concomitant abnormal relaxation and increased filling pressure (grade 2 diastolic Dysfunction.Right ventricle: The cavity size was moderately  dilated. Antibiotics:  none  HPI/Subjective: Able to lay flat  Objective: Filed Vitals:   07/01/13 1057 07/01/13 1352 07/01/13 2028 07/02/13 0555  BP: 145/64 99/74 135/60 145/53  Pulse: 58 56 65 56  Temp: 97.8 F (36.6 C) 97.5 F (36.4 C) 97.4 F (36.3 C) 97.8 F (36.6 C)  TempSrc: Oral Oral Oral Oral  Resp: 20 20 20 18   Height:      Weight:    92.942 kg (204 lb 14.4 oz)  SpO2: 99% 96% 99% 95%     Exam:  General: Alert, awake, oriented x3, in no acute distress.  HEENT: No bruits, no goiter. +JVD Heart: Regular rate and rhythm, lower extremity edema. Lungs: Good air movement, bilateral air movement.  Abdomen: Soft, nontender, nondistended, positive bowel sounds.  Neuro: Grossly intact, nonfocal.   Data Reviewed: Basic Metabolic Panel:  Recent Labs Lab 06/29/13 1210 06/30/13 0533 07/01/13 0532 07/02/13 0517  NA 142 140 138 135  K 3.8 3.6 3.5 4.1  CL 106 103 101 101  CO2 25 26 25 25   GLUCOSE 109* 102* 98 102*  BUN 12 17 18 23   CREATININE 1.03 1.16 1.11 1.20  CALCIUM 9.1 9.2 9.1 9.4   Liver Function Tests:  Recent Labs Lab 06/29/13 1210  AST 25  ALT 12  ALKPHOS 45  BILITOT 0.9  PROT 6.5  ALBUMIN 3.3*   No results found for this basename: LIPASE, AMYLASE,  in the last 168 hours No results found for this basename: AMMONIA,  in the last 168 hours CBC:  Recent Labs Lab 06/29/13 1210  WBC 5.1  NEUTROABS 3.1  HGB 13.5  HCT 39.2  MCV 92.5  PLT 119*   Cardiac Enzymes:  Recent Labs Lab 06/29/13 1435 06/29/13 2000 06/30/13 0712  TROPONINI <0.30 <0.30 <0.30   BNP (last 3 results)  Recent Labs  06/29/13 1210  PROBNP 2336.0*   CBG: No results found for this basename: GLUCAP,  in the last 168 hours  No results found for this or any previous visit (from the past 240 hour(s)).   Studies: No results found.  Scheduled Meds: . aspirin EC  81 mg Oral Daily  . fenofibrate  54 mg Oral Daily  . furosemide  40 mg Intravenous BID  .  heparin  5,000 Units Subcutaneous Q8H  . lisinopril  5 mg Oral Daily  . metoprolol  50 mg Oral BID  . tamsulosin  0.4 mg Oral Daily   Continuous Infusions:    Marinda Elk  [facility] Pager [phone] . If 8PM-8AM, please contact night-coverage at [email or web address], password [facility] 07/02/2013, 8:52 AM  LOS: 3 days

## 2013-07-02 NOTE — Progress Notes (Signed)
Patient on side of bed this morning taking a bath. Assisted patient with gown and to get back in bed. Medications given. Currently patient complains of no pain or discomfort. Will continue to monitor to end of shift.

## 2013-07-03 LAB — BASIC METABOLIC PANEL
BUN: 28 mg/dL — ABNORMAL HIGH (ref 6–23)
Creatinine, Ser: 1.36 mg/dL — ABNORMAL HIGH (ref 0.50–1.35)
GFR calc Af Amer: 54 mL/min — ABNORMAL LOW (ref >90)
GFR calc non Af Amer: 47 mL/min — ABNORMAL LOW (ref >90)
Sodium: 135 mEq/L (ref 135–145)

## 2013-07-03 MED ORDER — FUROSEMIDE 20 MG PO TABS
20.0000 mg | ORAL_TABLET | Freq: Two times a day (BID) | ORAL | Status: DC
  Filled 2013-07-03 (×3): qty 1

## 2013-07-03 NOTE — Progress Notes (Signed)
Pt. Given discharge instructions questions answered. Taken down via wheelchair to retrieve car keys from security.

## 2013-07-03 NOTE — Discharge Summary (Signed)
Physician Discharge Summary  [name] ZOX:[id number] DOB: [birth year] DOA: 06/29/2013  PCP: Leo Grosser, MD  Admit date: 06/29/2013 Discharge date: 07/03/2013  Time spent: 45 minutes  Recommendations for Outpatient Follow-up:  1. Follow up with cardiologist in 1 week. BNP    Component Value Date/Time   PROBNP 2336.0* 06/29/2013 1210   Filed Weights   07/01/13 0500 07/02/13 0555 07/03/13 0627  Weight: 93.985 kg (207 lb 3.2 oz) 92.942 kg (204 lb 14.4 oz) 93.668 kg (206 lb 8 oz)     Discharge Diagnoses:  Active Problems:   Hypertension   Frequent falls   Pulmonary HTN   Peripheral edema   CHF (congestive heart failure)   Dyspnea on exertion   Acute on chronic diastolic congestive heart failure   Discharge Condition: stable  Diet recommendation: low sodium diet    History of present illness:  79 y.o. male has a past medical history significant for hypertension, hyperlipidemia, congestive heart failure with preserved systolic function, presents from the PCPs office with couple weeks of history of shortness of breath, leg swelling and weight gain of about 16 pounds. He denies any chest pain or palpitations. He denies any cough or sputum production. He shortness of breath is getting worse with exertion. He denies medication noncompliance. He states that he "doesn't add much salt" to his food, however is not checking regularly sodium content of what he eats. He denies any abdominal pain, nausea vomiting or diarrhea. He denies any lightheadedness or dizziness. He endorses orthopnea. In the emergency room, blood work remarkable for an elevated BNP, clinical exam consistent with fluid overload, and a chest x-ray that showed cardiomegaly and vascular congestion with trace bilateral effusions   Hospital Course:  Acute on chronic diastolic congestive heart failure:  - Started on IV lasix, good urine output. stricit I and O's.  - Able to sleep flat.  - Monitor electrolytes  and replete as needed.  - Cont ACE, Metoprolol and lasix.  - Echo 11.26.2014: as below.   Frequent falls:  - pt consult. - refused PT evaluation Hypertension:  - mildly high,  - increase ACE.   Procedures: Echo 11.26.2014: ejection fraction was in the range of 60% to 65%. Wall motion was normal; there were no regional wall motion abnormalities. Features are consistent with a pseudonormal left ventricular filling pattern, with concomitant abnormal relaxation and increased filling pressure (grade 2 diastolic Dysfunction.Right ventricle: The cavity size was moderately dilated   Consultations:  none  Discharge Exam: Filed Vitals:   07/03/13 0627  BP: 122/61  Pulse: 55  Temp: 97.6 F (36.4 C)  Resp: 18    General: A&O x3 Cardiovascular: RRR Respiratory: good air movement CTA B/L  Discharge Instructions      Discharge Orders   Future Orders Complete By Expires   Diet - low sodium heart healthy  As directed    Increase activity slowly  As directed        Medication List         diclofenac sodium 1 % Gel  Commonly known as:  VOLTAREN  Apply 4 g topically 4 (four) times daily as needed. Pain     fenofibrate 145 MG tablet  Commonly known as:  TRICOR  Take 1 tablet (145 mg total) by mouth daily.     fish oil-omega-3 fatty acids 1000 MG capsule  Take 2 g by mouth every morning.     fluticasone 50 MCG/ACT nasal spray  Commonly known as:  FLONASE  Place  2 sprays into the nose daily.     furosemide 40 MG tablet  Commonly known as:  LASIX  Take 20 mg by mouth 2 (two) times daily.     metoprolol 100 MG tablet  Commonly known as:  LOPRESSOR  Take 50 mg by mouth 2 (two) times daily.     MUSCLE RUB 10-15 % Crea  Apply 1 application topically daily as needed (FOR PAIN).     sildenafil 50 MG tablet  Commonly known as:  VIAGRA  Take 50 mg by mouth daily as needed.     tamsulosin 0.4 MG Caps capsule  Commonly known as:  FLOMAX  Take 1 capsule (0.4 mg total) by  mouth daily.     traMADol 50 MG tablet  Commonly known as:  ULTRAM  TAKE ONE TABLET EVERY SIX HOURS AS NEEDED FOR PAIN     VITAMIN B12 PO  Take 1 tablet by mouth every morning.       No Known Allergies Follow-up Information   Follow up with Rollene Rotunda, MD In 1 week. (hospital follow up)    Specialty:  Cardiology   Contact information:   [address] [place] [address] [phone]        The results of significant diagnostics from this hospitalization (including imaging, microbiology, ancillary and laboratory) are listed below for reference.    Significant Diagnostic Studies: Dg Chest Portable 1 View  06/29/2013   CLINICAL DATA:  Shortness of breath.  Weight gain.  EXAM:  PORTABLE CHEST - 1 VIEW  COMPARISON:  PA and lateral chest 11/04/2012.  FINDINGS: There is cardiomegaly and pulmonary vascular congestion. Trace bilateral pleural effusions are noted. No consolidative process or pneumothorax.  IMPRESSION: Cardiomegaly and vascular congestion with trace bilateral effusions.   Electronically Signed   By: Drusilla Kanner M.D.   On: 06/29/2013 13:37    Microbiology: No results found for this or any previous visit (from the past 240 hour(s)).   Labs: Basic Metabolic Panel:  Recent Labs Lab 06/29/13 1210 06/30/13 0533 07/01/13 0532 07/02/13 0517 07/03/13 0518  NA 142 140 138 135 135  K 3.8 3.6 3.5 4.1 3.7  CL 106 103 101 101 98  CO2 25 26 25 25 28   GLUCOSE 109* 102* 98 102* 102*  BUN 12 17 18 23  28*  CREATININE 1.03 1.16 1.11 1.20 1.36*  CALCIUM 9.1 9.2 9.1 9.4 9.4   Liver Function Tests:  Recent Labs Lab 06/29/13 1210  AST 25  ALT 12  ALKPHOS 45  BILITOT 0.9  PROT 6.5  ALBUMIN 3.3*   No results found for this basename: LIPASE, AMYLASE,  in the last 168 hours No results found for this basename: AMMONIA,  in the last 168 hours CBC:  Recent Labs Lab 06/29/13 1210  WBC 5.1  NEUTROABS 3.1  HGB 13.5  HCT  39.2  MCV 92.5  PLT 119*   Cardiac Enzymes:  Recent Labs Lab 06/29/13 1435 06/29/13 2000 06/30/13 0712  TROPONINI <0.30 <0.30 <0.30   BNP: BNP (last 3 results)  Recent Labs  06/29/13 1210  PROBNP 2336.0*   CBG: No results found for this basename: GLUCAP,  in the last 168 hours     Signed:  Marinda Elk  [facility] 07/03/2013, 9:55 AM

## 2013-07-09 ENCOUNTER — Telehealth: Payer: Self-pay | Admitting: Family Medicine

## 2013-07-09 MED ORDER — DICLOFENAC SODIUM 1 % TD GEL: 4.0000 g | Freq: Four times a day (QID) | TRANSDERMAL | Status: DC | PRN

## 2013-07-09 NOTE — Telephone Encounter (Signed)
Medication refilled per protocol. 

## 2013-08-02 ENCOUNTER — Telehealth: Payer: Self-pay | Admitting: Family Medicine

## 2013-08-02 NOTE — Telephone Encounter (Signed)
Pt states he had a missed call  Call back number is [phone]

## 2013-08-09 NOTE — Telephone Encounter (Signed)
Pt doesn't know why or who called.  Can not see why or who called.

## 2013-08-20 ENCOUNTER — Telehealth: Payer: Self-pay | Admitting: Cardiology

## 2013-08-20 NOTE — Telephone Encounter (Signed)
New message     Need pts last ejection fraction----pls fax to [phone]

## 2013-08-20 NOTE — Telephone Encounter (Signed)
New message   C/o sob . Patient stated he having sob now.

## 2013-08-20 NOTE — Telephone Encounter (Signed)
Pt called because he has been SOB since D/C from the hospital after thanksgiving 2014. Pt has an appointment for January 23 rd 2015 at 11:30 AM, but he needs to have an appointment before then because he does not want to go to the hospital. Pt is aware that will send this message to Dr.Hochrein's nurse for recommendations.

## 2013-08-20 NOTE — Telephone Encounter (Signed)
Faxed as requested

## 2013-08-20 NOTE — Telephone Encounter (Signed)
Attempted to give pt an appointment to be seen in the [place] office but he said he doesn't know where that is.  He will wait until Friday as scheduled to see Dr Percival Spanish but will call his PCP in the mean time for possible treatment if s/s worsen.

## 2013-08-23 ENCOUNTER — Telehealth: Payer: Self-pay | Admitting: Family Medicine

## 2013-08-23 NOTE — Telephone Encounter (Signed)
[name] is calling from [facility] and she is needing a current medication list for [name] and it needs to be faxed to [phone] Her call back number is [phone] ext [phone]

## 2013-08-23 NOTE — Telephone Encounter (Signed)
List faxed to requested number

## 2013-08-27 ENCOUNTER — Encounter: Payer: Self-pay | Admitting: Cardiology

## 2013-08-27 ENCOUNTER — Ambulatory Visit (INDEPENDENT_AMBULATORY_CARE_PROVIDER_SITE_OTHER): Payer: Medicare Other | Admitting: Cardiology

## 2013-08-27 VITALS — BP 124/70 | HR 70 | Ht 70.0 in | Wt 220.0 lb

## 2013-08-27 DIAGNOSIS — I509 Heart failure, unspecified: Secondary | ICD-10-CM

## 2013-08-27 DIAGNOSIS — I10 Essential (primary) hypertension: Secondary | ICD-10-CM

## 2013-08-27 MED ORDER — CLONIDINE HCL 0.1 MG PO TABS
0.1000 mg | ORAL_TABLET | Freq: Once | ORAL | Status: AC
  Administered 2013-08-27: 0.1 mg via ORAL

## 2013-08-27 NOTE — Progress Notes (Signed)
HPI The patient for followup of dyspnea. He was hospitalized in November. I had seen him prior to this for lower extremity swelling. He was found to be volume overloaded. Echocardiography demonstrated a well preserved ejection fraction.  There was some evidence of diastolic dysfunction. There was an echogenic mobile mass on the anterior leaflet of the mitral valve. However, there was no regurgitation or other significant valvular pathology. There was no mention of septal hypertrophy. He actually feels very well. He says he is keeping his weight down although today's weight in the office initially was higher than his reading at home. He says he is rarely getting shortness of breath and was not sustained. He denies any PND or orthopnea. He has no chest pressure, neck or arm discomfort. He has no palpitations, presyncope or syncope. He's staying active.  No Known Allergies  Current Outpatient Prescriptions  Medication Sig Dispense Refill  . Cyanocobalamin (VITAMIN B12 PO) Take 1 tablet by mouth every morning.       . diclofenac sodium (VOLTAREN) 1 % GEL Apply 4 g topically 4 (four) times daily as needed. Pain  100 g  1  . fenofibrate (TRICOR) 145 MG tablet Take 1 tablet (145 mg total) by mouth daily.  30 tablet  5  . fish oil-omega-3 fatty acids 1000 MG capsule Take 2 g by mouth every morning.      . fluticasone (FLONASE) 50 MCG/ACT nasal spray Place 2 sprays into the nose daily.  16 g  11  . furosemide (LASIX) 40 MG tablet Take 20 mg by mouth 2 (two) times daily.      . Menthol-Methyl Salicylate (MUSCLE RUB) 10-15 % CREA Apply 1 application topically daily as needed (FOR PAIN).      Marland Kitchen metoprolol (LOPRESSOR) 100 MG tablet Take 50 mg by mouth 2 (two) times daily.       . sildenafil (VIAGRA) 50 MG tablet Take 50 mg by mouth daily as needed.      . tamsulosin (FLOMAX) 0.4 MG CAPS capsule Take 1 capsule (0.4 mg total) by mouth daily.  30 capsule  3  . traMADol (ULTRAM) 50 MG tablet TAKE ONE TABLET  EVERY SIX HOURS AS NEEDED FOR PAIN  40 tablet  2   No current facility-administered medications for this visit.    Past Medical History  Diagnosis Date  . Hypertension   . OA (osteoarthritis)     rt leg  . ED (erectile dysfunction)   . Chronic fatigue   . Gout   . Bowing of leg   . Spinal stenosis   . Prostate cancer   . CHF (congestive heart failure)     Past Surgical History  Procedure Laterality Date  . Back surgery x 2    . Rt rotator cuff repair    . Cervical spine surgery    . Back surgery       ROS: some difficulty swallowing, urinary frequency, leg swelling. Otherwise as stated in the HPI and negative for all other systems.  PHYSICAL EXAM There were no vitals taken for this visit. GENERAL:  Well appearing HEENT:  Pupils equal round and reactive, fundi not visualized, oral mucosa unremarkable, dentures NECK:  No jugular venous distention, waveform within normal limits, carotid upstroke brisk and symmetric, no bruits, no thyromegaly LYMPHATICS:  No cervical, inguinal adenopathy LUNGS:  Clear to auscultation bilaterally BACK:  No CVA tenderness CHEST:  Unremarkable HEART:  PMI not displaced or sustained,S1 and S2 within normal limits, no S3,  no S4, no clicks, no rubs, 3/6 apical systolic murmur radiating out the outflow tract and no diastolic murmurs ABD:  Flat, positive bowel sounds normal in frequency in pitch, no bruits, no rebound, no guarding, no midline pulsatile mass, no hepatomegaly, no splenomegaly EXT:  2 plus pulses throughout, moderate bilateral lower extremity edema, no cyanosis no clubbing SKIN:  No rashes no nodules NEURO:  Cranial nerves II through XII grossly intact, motor grossly intact throughout PSYCH:  Cognitively intact, oriented to person place and time  ASSESSMENT AND PLAN  EDEMA:  This is now being managed conservatively. No change in therapy is indicated.  PULMONARY HTN:  On his echo there was no TR this last time and no mention of  pulmonary hypertension. I will repeat this in the months to come as below.  HTN:  His blood pressure is well controlled. He will continue the meds as listed.    MURMUR:   I don't see on echo the etiology of this. Again this will be reassessed at the next appointment.  MITRAL MASS:  He did not have a TEE or other evaluation of this at the time of his hospitalization. He's had no symptoms related to this and there was no mention of regurgitation. I will reassess this with an echocardiogram repeated at the next visit.

## 2013-08-27 NOTE — Patient Instructions (Addendum)
The current medical regimen is effective;  continue present plan and medications.  Your physician has requested that you have an echocardiogram in 6 months. Echocardiography is a painless test that uses sound waves to create images of your heart. It provides your doctor with information about the size and shape of your heart and how well your heart's chambers and valves are working. This procedure takes approximately one hour. There are no restrictions for this procedure.  Follow up in 6 months with Dr Percival Spanish.  You will receive a letter in the mail 2 months before you are due.  Please call us when you receive this letter to schedule your follow up appointment.

## 2013-08-30 ENCOUNTER — Other Ambulatory Visit: Payer: Self-pay | Admitting: Family Medicine

## 2013-08-30 MED ORDER — TRAMADOL HCL 50 MG PO TABS: ORAL_TABLET | ORAL | Status: DC

## 2013-08-30 NOTE — Telephone Encounter (Signed)
ok 

## 2013-08-30 NOTE — Telephone Encounter (Signed)
?   OK to Refill  

## 2013-08-30 NOTE — Telephone Encounter (Signed)
Pt walked in needed refill on ultram

## 2013-08-31 ENCOUNTER — Other Ambulatory Visit: Payer: Self-pay | Admitting: Family Medicine

## 2013-08-31 ENCOUNTER — Telehealth: Payer: Self-pay | Admitting: Family Medicine

## 2013-08-31 MED ORDER — METOPROLOL TARTRATE 100 MG PO TABS: 50.0000 mg | ORAL_TABLET | Freq: Two times a day (BID) | ORAL | Status: DC

## 2013-08-31 NOTE — Telephone Encounter (Signed)
Rx Refilled  

## 2013-08-31 NOTE — Telephone Encounter (Signed)
Pharmacist called to clarify Metoprolol 100mg .  Refill received today states for patient to take 1/2 tablet (50 mg) BID.  Pharmacist wanted to clarify this dose.  Per chart.  Patient has suppose to be taking that dose since hospital discharge in November.  Pharmacist states patient has been refilling old prescriptions and has not received any new presciptions from patient since the one they have on file from Dr Jacelyn Grip.  Pharmacist states patient has been taking the full 100 mg pill BID.  Pharmacist will fill today's refill and instruct patient to only take 1/2 tablet BID.

## 2013-08-31 NOTE — Telephone Encounter (Signed)
Agreed -

## 2013-09-01 ENCOUNTER — Ambulatory Visit: Payer: Medicare Other | Admitting: Family Medicine

## 2013-09-03 ENCOUNTER — Encounter: Payer: Self-pay | Admitting: Family Medicine

## 2013-09-03 ENCOUNTER — Ambulatory Visit (INDEPENDENT_AMBULATORY_CARE_PROVIDER_SITE_OTHER): Payer: Medicare Other | Admitting: Family Medicine

## 2013-09-03 VITALS — BP 124/80 | HR 78 | Temp 97.4°F | Resp 18 | Ht 64.0 in | Wt 215.0 lb

## 2013-09-03 DIAGNOSIS — H04129 Dry eye syndrome of unspecified lacrimal gland: Secondary | ICD-10-CM

## 2013-09-03 DIAGNOSIS — I509 Heart failure, unspecified: Secondary | ICD-10-CM

## 2013-09-03 DIAGNOSIS — R609 Edema, unspecified: Secondary | ICD-10-CM

## 2013-09-03 LAB — BASIC METABOLIC PANEL WITH GFR
BUN: 23 mg/dL (ref 6–23)
CHLORIDE: 103 meq/L (ref 96–112)
CO2: 26 meq/L (ref 19–32)
Calcium: 8.9 mg/dL (ref 8.4–10.5)
Creat: 1.24 mg/dL (ref 0.50–1.35)
GFR, Est African American: 62 mL/min
GFR, Est Non African American: 54 mL/min — ABNORMAL LOW
GLUCOSE: 103 mg/dL — AB (ref 70–99)
Potassium: 4.1 mEq/L (ref 3.5–5.3)
SODIUM: 139 meq/L (ref 135–145)

## 2013-09-03 NOTE — Assessment & Plan Note (Signed)
He is currently compensated out of his weight is up a little bit this afternoon. He will continue the Lasix as prescribed. He will followup with cardiology

## 2013-09-03 NOTE — Assessment & Plan Note (Signed)
Advise him to use artificial tears over-the-counter   He can try the biotin for the dry mouth however we cannot stop his diuretic at this time there is no sign of acute infection

## 2013-09-03 NOTE — Progress Notes (Signed)
   Subjective:    Patient ID: [name], male    DOB: August 05, 1929, 80 y.o.   MRN: [id number]  HPI Patient here to followup chronic medical problems. Her last visit he was hospitalized secondary to CHF exacerbation with an almost 20 pound weight gain. He's been doing well his dry weight is somewhere around 207 pounds in the morning but today he is at 2:15 this afternoon. He was recently seen by cardiology and has been doing well he is due to have an echocardiogram in 6 months. He's been taking 20 mg of Lasix twice a day without any shortness of breath or chest pain He does notice that his mouth has been dry since he is taking his Lasix on a regular basis He also requested something for dry eyes    Review of Systems  GEN- denies fatigue, fever, weight loss,weakness, recent illness HEENT- denies eye drainage, change in vision, nasal discharge, CVS- denies chest pain, palpitations RESP- denies SOB, cough, wheeze MSK- denies joint pain, muscle aches, injury Neuro- denies headache, dizziness, syncope, seizure activity      Objective:   Physical Exam GEN- NAD, alert and oriented x3 HEENT- PERRL, EOMI, non injected sclera, pink conjunctiva, MMM, oropharynx clear Neck- Supple, no JVD, no bruit CVS- RRR, 3/5 SEM RESP-CTAB, normal WOB, EXT- +pedal edema,  chronic venous stasis changes Pulses- Radial, DP- decreased bilat        Assessment & Plan:

## 2013-09-03 NOTE — Assessment & Plan Note (Signed)
His swelling looks very good he is also wearing compression

## 2013-09-03 NOTE — Patient Instructions (Signed)
Biotin mouthrinse for dry mouth Artificial Tears for Dry Eyes  We will call with your lab results F/U 3 months- Dr. Dennard Schaumann

## 2013-09-06 ENCOUNTER — Encounter: Payer: Self-pay | Admitting: *Deleted

## 2013-10-11 ENCOUNTER — Encounter: Payer: Self-pay | Admitting: Physician Assistant

## 2013-10-11 ENCOUNTER — Telehealth: Payer: Self-pay | Admitting: Family Medicine

## 2013-10-11 ENCOUNTER — Ambulatory Visit (INDEPENDENT_AMBULATORY_CARE_PROVIDER_SITE_OTHER): Payer: Medicare Other | Admitting: Physician Assistant

## 2013-10-11 VITALS — BP 124/74 | HR 60 | Temp 97.7°F | Resp 18 | Ht 64.75 in | Wt 222.0 lb

## 2013-10-11 DIAGNOSIS — I509 Heart failure, unspecified: Secondary | ICD-10-CM

## 2013-10-11 DIAGNOSIS — I5033 Acute on chronic diastolic (congestive) heart failure: Secondary | ICD-10-CM

## 2013-10-11 DIAGNOSIS — I272 Pulmonary hypertension, unspecified: Secondary | ICD-10-CM

## 2013-10-11 DIAGNOSIS — I2789 Other specified pulmonary heart diseases: Secondary | ICD-10-CM

## 2013-10-11 NOTE — Telephone Encounter (Signed)
Pt C/O problems passing his water??  Could not get more specific symptoms out of him.  States his med's are not working but doesn't know which one??  Wants someone to see him this afternoon.  Given apt at 230.

## 2013-10-11 NOTE — Progress Notes (Signed)
Patient ID: [name] MRN: [id number], DOB: 01/24/1929, 80 y.o. Date of Encounter: 10/11/2013, 3:02 PM    Chief Complaint:  Chief Complaint  Patient presents with  . c/o not urinating properly    shortness of breath, loss of energy     HPI: 80 y.o. year old male says for the last few days he has been having decreased amount of urine output and decreased frequency of urination. Says that he has a scale at home that is computerized. Says it tells him to "stand still for 3 seconds" and then it asks him 10 different questions" regarding  "swelling and whether his clothes are fitting tightly, "   etc. Says usually his weight is between 209-211. This morning he got 214. Says he has been told that  If his weight ever goes up more than 2 pounds to go to see his doctor immediately. Says that the answers he provides to the 10 questions goes to " nurses at [facility]". He talked to the nurse  and they have scheduled to come to his house on March 12 at 11:30. Says he sleeps on one pillow at night no orthopnea. As that one day he may feel short of breath when he walks within the next day he won't. Says it seems to be random. In general has not noticed any overall increase in shortness of breath/dyspnea on exertion over the last several days compared to usual. Does not have any unusual cough. There is compression stockings as directed--has noticed no increased edema.    Home Meds: See attached medication section for any medications that were entered at today's visit. The computer does not put those onto this list.The following list is a list of meds entered prior to today's visit.   Current Outpatient Prescriptions on File Prior to Visit  Medication Sig Dispense Refill  . Cyanocobalamin (VITAMIN B12 PO) Take 1 tablet by mouth every morning.       . diclofenac sodium (VOLTAREN) 1 % GEL Apply 4 g topically 4 (four) times daily as needed. Pain  100 g  1  . fenofibrate (TRICOR) 145 MG tablet Take 1  tablet (145 mg total) by mouth daily.  30 tablet  5  . fish oil-omega-3 fatty acids 1000 MG capsule Take 2 g by mouth every morning.      . fluticasone (FLONASE) 50 MCG/ACT nasal spray Place 2 sprays into the nose daily.  16 g  11  . furosemide (LASIX) 40 MG tablet Take 20 mg by mouth 2 (two) times daily.      . Menthol-Methyl Salicylate (MUSCLE RUB) 10-15 % CREA Apply 1 application topically daily as needed (FOR PAIN).      Marland Kitchen metoprolol (LOPRESSOR) 100 MG tablet Take 0.5 tablets (50 mg total) by mouth 2 (two) times daily.  30 tablet  5  . sildenafil (VIAGRA) 50 MG tablet Take 50 mg by mouth daily as needed.      . tamsulosin (FLOMAX) 0.4 MG CAPS capsule Take 1 capsule (0.4 mg total) by mouth daily.  30 capsule  3  . traMADol (ULTRAM) 50 MG tablet TAKE ONE TABLET EVERY SIX HOURS AS NEEDED FOR PAIN  40 tablet  2   No current facility-administered medications on file prior to visit.    Allergies: No Known Allergies    Review of Systems: See HPI for pertinent ROS. All other ROS negative.    Physical Exam: Blood pressure 124/74, pulse 60, temperature 97.7 F (36.5 C), temperature  source Oral, resp. rate 18, height 5' 4.75" (1.645 m), weight 222 lb (100.699 kg), SpO2 99.00%., Body mass index is 37.21 kg/(m^2). General: WNWD male. Appears in no acute distress. Neck: Supple. No thyromegaly. No lymphadenopathy. No JVD. Lungs: Clear bilaterally to auscultation without wheezes, rales, or rhonchi. Breathing is unlabored. Heart: Regular rhythm. III/VI murmur at apex Abdomen: Soft, non-tender, non-distended with normoactive bowel sounds. No hepatomegaly. No rebound/guarding. No obvious abdominal masses. Msk:  Strength and tone normal for age. Extremities/Skin: Warm and dry. He is wearing compression stockings. Has 1+ lower extremity the edema. Neuro: Alert and oriented X 3. Moves all extremities spontaneously. Gait is normal. CNII-XII grossly in tact. Psych:  Responds to questions appropriately with  a normal affect.     ASSESSMENT AND PLAN:  80 y.o. year old male with  1. H/O  Acute on chronic diastolic congestive heart failure--status post hospitalization with Simona Huh 06/2013  2. CHF (congestive heart failure)  3. Pulmonary HTN  His Lasix is 20 mg pills . he is currently taking  1/2 of a pill twice a day.  I told him to increase the morning dose to a whole pill  and keep the afternoon dose at 1/2  Pill. He is to have a nurse home visit on 10/14/13 at 11:30. He needs to keep that appointment for f/u.  [name], [place], [name] 10/11/2013 3:02 PM

## 2013-10-12 ENCOUNTER — Other Ambulatory Visit: Payer: Self-pay | Admitting: Family Medicine

## 2013-10-20 ENCOUNTER — Ambulatory Visit (INDEPENDENT_AMBULATORY_CARE_PROVIDER_SITE_OTHER): Payer: Medicare Other | Admitting: Family Medicine

## 2013-10-20 ENCOUNTER — Encounter: Payer: Self-pay | Admitting: Family Medicine

## 2013-10-20 VITALS — BP 148/72 | HR 66 | Temp 97.9°F | Resp 18 | Ht 67.0 in | Wt 219.0 lb

## 2013-10-20 DIAGNOSIS — I10 Essential (primary) hypertension: Secondary | ICD-10-CM

## 2013-10-20 DIAGNOSIS — R42 Dizziness and giddiness: Secondary | ICD-10-CM

## 2013-10-20 LAB — CBC WITH DIFFERENTIAL/PLATELET
BASOS PCT: 2 % — AB (ref 0–1)
Basophils Absolute: 0.1 10*3/uL (ref 0.0–0.1)
EOS ABS: 0.3 10*3/uL (ref 0.0–0.7)
Eosinophils Relative: 6 % — ABNORMAL HIGH (ref 0–5)
HCT: 42.4 % (ref 39.0–52.0)
HEMOGLOBIN: 14.2 g/dL (ref 13.0–17.0)
LYMPHS ABS: 1.8 10*3/uL (ref 0.7–4.0)
Lymphocytes Relative: 36 % (ref 12–46)
MCH: 31.2 pg (ref 26.0–34.0)
MCHC: 33.5 g/dL (ref 30.0–36.0)
MCV: 93.2 fL (ref 78.0–100.0)
MONO ABS: 0.5 10*3/uL (ref 0.1–1.0)
MONOS PCT: 9 % (ref 3–12)
NEUTROS PCT: 47 % (ref 43–77)
Neutro Abs: 2.4 10*3/uL (ref 1.7–7.7)
Platelets: 135 10*3/uL — ABNORMAL LOW (ref 150–400)
RBC: 4.55 MIL/uL (ref 4.22–5.81)
RDW: 14.3 % (ref 11.5–15.5)
WBC: 5.1 10*3/uL (ref 4.0–10.5)

## 2013-10-20 LAB — BASIC METABOLIC PANEL WITH GFR
BUN: 18 mg/dL (ref 6–23)
CO2: 27 meq/L (ref 19–32)
Calcium: 9.1 mg/dL (ref 8.4–10.5)
Chloride: 102 mEq/L (ref 96–112)
Creat: 1.64 mg/dL — ABNORMAL HIGH (ref 0.50–1.35)
GFR, EST AFRICAN AMERICAN: 44 mL/min — AB
GFR, Est Non African American: 38 mL/min — ABNORMAL LOW
GLUCOSE: 118 mg/dL — AB (ref 70–99)
Potassium: 3.9 mEq/L (ref 3.5–5.3)
SODIUM: 138 meq/L (ref 135–145)

## 2013-10-20 NOTE — Assessment & Plan Note (Signed)
I think that this is his blood pressure dropping a little bit when he is getting up I do not see any new neurological deficits he's not in acute CHF at this time. I will adjust his medications per above

## 2013-10-20 NOTE — Patient Instructions (Signed)
Make sure you are drinking fluids throughout the day Continue water pill METOPROLOL Take 1/2 pill twice a day Keep a check on your blood pressure F/U April as scheduled

## 2013-10-20 NOTE — Assessment & Plan Note (Signed)
His blood pressure is low on the lower and especially for his age this may be due to him taking the correct dose of his water pill as well as taking a higher dose of metoprolol and then what was prescribed. He is going to bring his goal bottles back to the office I can review them device and it does take a half a tablet of the metoprolol twice a day.

## 2013-10-20 NOTE — Progress Notes (Signed)
Patient ID: [name], male   DOB: 1928/12/01, 80 y.o.   MRN: [id number]   Subjective:    Patient ID: [name], male    DOB: 1929/06/10, 80 y.o.   MRN: [id number]  Patient presents for Dizziness  Pt here with dizziness, he states over the past few days he has had dizzy episodes when he gets up from a lying or seated position. He denies any headache chest pain or shortness of breath. He occasionally gets some neck pain that goes into his back but this is not new and he does take tramadol for this. He's taken his medications as prescribed however when we call the pharmacist his metoprolol supposed to be a half a tablet twice a day and he thinks he's been taking a full tablet twice a day his Lasix is currently at 40 mg in the morning and 20 mg in the evening which is what we have been advising him since his last hospitalization but he has been sticking to this over the past week. He states that he has been drinking throughout the day and that his weight has been stable at home his weight is 214.5 pounds.    Review Of Systems:  GEN- denies fatigue, fever, weight loss,weakness, recent illness HEENT- denies eye drainage, change in vision, nasal discharge, CVS- denies chest pain, palpitations RESP- denies SOB, cough, wheeze MSK-+ joint pain, muscle aches, injury Neuro- denies headache, dizziness, syncope, seizure activity       Objective:    BP 148/72  Pulse 66  Temp(Src) 97.9 F (36.6 C)  Resp 18  Ht 5\' 7"  (1.702 m)  Wt 219 lb (99.338 kg)  BMI 34.29 kg/m2 GEN- NAD, alert and oriented x3 HEENT- PERRL, EOMI, non injected sclera, pink conjunctiva, MMM, oropharynx clear, TM Clear bilat Neck- Supple, no JVD, no bruit CVS- RRR, 3/5 SEM RESP-CTAB, normal WOB, EXT- + trace pedal edema,  chronic venous stasis changes Pulses- Radial, DP- decreased bilat Neuro- CNII-XII in tact, no focal deficits  Orthostatics Lying 122/64 , sitting 120/64, standing 124/68        Assessment &  Plan:      Problem List Items Addressed This Visit   None      Note: This dictation was prepared with Dragon dictation along with smaller phrase technology. Any transcriptional errors that result from this process are unintentional.

## 2013-10-21 ENCOUNTER — Other Ambulatory Visit: Payer: Self-pay | Admitting: *Deleted

## 2013-10-21 DIAGNOSIS — I509 Heart failure, unspecified: Secondary | ICD-10-CM

## 2013-11-01 ENCOUNTER — Ambulatory Visit (INDEPENDENT_AMBULATORY_CARE_PROVIDER_SITE_OTHER): Payer: Medicare Other | Admitting: Physician Assistant

## 2013-11-01 ENCOUNTER — Encounter: Payer: Self-pay | Admitting: Physician Assistant

## 2013-11-01 VITALS — BP 122/60 | HR 66 | Temp 97.5°F | Resp 18 | Ht 67.0 in | Wt 224.0 lb

## 2013-11-01 DIAGNOSIS — I509 Heart failure, unspecified: Secondary | ICD-10-CM

## 2013-11-01 DIAGNOSIS — I5033 Acute on chronic diastolic (congestive) heart failure: Secondary | ICD-10-CM

## 2013-11-01 NOTE — Progress Notes (Signed)
Patient ID: [name] MRN: [id number], DOB: 01-09-1929, 80 y.o. Date of Encounter: 11/01/2013, 4:01 PM    Chief Complaint:  Chief Complaint  Patient presents with  . Shortness of Breath    X today     HPI: 80 y.o. year old male says that both yesterday and today he has felt a little more short of breath when he is up moving around than he usually does. Says that while sitting at rest he has no increased shortness of breath. However, at his last visit with me on 10/11/13 he told me that some days he felt more short of breath than others and it seemed to be random. Today he again tells me that he is still sleeping on one pillow at night. Has no orthopnea. Has no PND.  Also came in today b/c he was concerned because his weight had gone up -- he is checking it at home. Says that Saturday morning on 10/30/13 he got 213 pounds. This morning he got 217 pounds. Says that his " tablet is not working". Says that it is the tablet that asks him  the 10 questions such as "are your clothes feeling tight" et [name]. Says the next home nurse visit is April 1. Says he was concerned because he has not urinated as much as usual this morning. Says that he did wake up and urinate at about 5 AM. Has only urinated 2 more times since then. Says usually he would've gone and urinated lots of times during that time span.  Today I have reviewed my last note from 10/11/13 and also review Dr. Dorian Heckle last note from 10/20/13. Also reviewed labs performed 318/15.     Home Meds: See attached medication section for any medications that were entered at today's visit. The computer does not put those onto this list.The following list is a list of meds entered prior to today's visit.   Current Outpatient Prescriptions on File Prior to Visit  Medication Sig Dispense Refill  . Cyanocobalamin (VITAMIN B12 PO) Take 1 tablet by mouth every morning.       . diclofenac sodium (VOLTAREN) 1 % GEL Apply 4 g topically 4 (four)  times daily as needed. Pain  100 g  1  . fenofibrate (TRICOR) 145 MG tablet TAKE ONE (1) TABLET BY MOUTH EVERY DAY  30 tablet  5  . fish oil-omega-3 fatty acids 1000 MG capsule Take 2 g by mouth every morning.      . fluticasone (FLONASE) 50 MCG/ACT nasal spray Place 2 sprays into the nose daily.  16 g  11  . furosemide (LASIX) 40 MG tablet Take 20 mg by mouth 2 (two) times daily.      . Menthol-Methyl Salicylate (MUSCLE RUB) 10-15 % CREA Apply 1 application topically daily as needed (FOR PAIN).      Marland Kitchen metoprolol (LOPRESSOR) 100 MG tablet Take 0.5 tablets (50 mg total) by mouth 2 (two) times daily.  30 tablet  5  . sildenafil (VIAGRA) 50 MG tablet Take 50 mg by mouth daily as needed.      . tamsulosin (FLOMAX) 0.4 MG CAPS capsule Take 1 capsule (0.4 mg total) by mouth daily.  30 capsule  3  . traMADol (ULTRAM) 50 MG tablet TAKE ONE TABLET EVERY SIX HOURS AS NEEDED FOR PAIN  40 tablet  2   No current facility-administered medications on file prior to visit.    Allergies: No Known Allergies    Review of Systems:  See HPI for pertinent ROS. All other ROS negative.    Physical Exam: Blood pressure 122/60, pulse 66, temperature 97.5 F (36.4 C), temperature source Oral, resp. rate 18, height 5\' 7"  (1.702 m), weight 224 lb (101.606 kg)., Body mass index is 35.08 kg/(m^2). General:  WNWD Male. Appears in no acute distress. Neck: Supple. No thyromegaly. No lymphadenopathy. Lungs: Clear bilaterally to auscultation without wheezes, rales, or rhonchi. Breathing is unlabored. Heart: Regular rhythm. III/VI  Murmur at apex.  Msk:  Strength and tone normal for age. Extremities/Skin: He is wearing compresson stockings. Trace Pitting edema bilaterally. Neuro: Alert and oriented X 3. Moves all extremities spontaneously. Gait is normal. CNII-XII grossly in tact. Psych:  Responds to questions appropriately with a normal affect.     ASSESSMENT AND PLAN:  80 y.o. year old male with  1. Acute on chronic  diastolic congestive heart failure Currently he is taking whole Lasix in the morning and a half in the afternoon. Told him that for today and tomorrow to go ahead and take a whole pill twice a day. Then return to a whole pill in the morning and a half in afternoon. Also will check labs monitor renal function. - BASIC METABOLIC PANEL WITH GFR   Signed, [address] [place], [place], [facility] 11/01/2013 4:01 PM

## 2013-11-02 LAB — BASIC METABOLIC PANEL WITH GFR
BUN: 23 mg/dL (ref 6–23)
CO2: 22 mEq/L (ref 19–32)
CREATININE: 1.35 mg/dL (ref 0.50–1.35)
Calcium: 8.8 mg/dL (ref 8.4–10.5)
Chloride: 104 mEq/L (ref 96–112)
GFR, EST AFRICAN AMERICAN: 56 mL/min — AB
GFR, Est Non African American: 49 mL/min — ABNORMAL LOW
GLUCOSE: 112 mg/dL — AB (ref 70–99)
POTASSIUM: 4.1 meq/L (ref 3.5–5.3)
Sodium: 138 mEq/L (ref 135–145)

## 2013-11-03 ENCOUNTER — Telehealth: Payer: Self-pay | Admitting: Family Medicine

## 2013-11-03 NOTE — Telephone Encounter (Signed)
Message copied by Olena Mater on Wed Nov 03, 2013  2:41 PM ------      Message from: Dena Billet      Created: Tue Nov 02, 2013  1:28 PM       Lab is okay. Tell him to take the fluid pill as planned at yesterday's visit. ------

## 2013-11-04 ENCOUNTER — Telehealth: Payer: Self-pay | Admitting: Cardiology

## 2013-11-04 NOTE — Telephone Encounter (Signed)
New message    [facility] calling asking the triage nurse to call the patient at home    Patient in the Heart failure program with [facility]  .  Patient C/O abd swelling, urine output has decrease x 4 day . More sob.

## 2013-11-04 NOTE — Telephone Encounter (Signed)
Left a message to Tacy Learn at [facility] failure [facility]  to call back. I also called pt's home phone, and  Left a message for pt to call back.

## 2013-11-04 NOTE — Telephone Encounter (Signed)
Attempted to call nurse to extension # [phone]. Phone was busy; I will call her later.

## 2013-11-05 NOTE — Telephone Encounter (Signed)
Tacy Learn nurse at heart failure clinic call back today regarding pt which c/o of abdominal swelling and decreased urine output. Nurse is aware that I tried to call pt yesterday, and left a message to call back. Nurse states she called pt today and has not been able to talk to pt.

## 2013-11-09 ENCOUNTER — Ambulatory Visit (INDEPENDENT_AMBULATORY_CARE_PROVIDER_SITE_OTHER): Payer: Medicare Other | Admitting: Family Medicine

## 2013-11-09 ENCOUNTER — Encounter: Payer: Self-pay | Admitting: Family Medicine

## 2013-11-09 VITALS — BP 120/70 | HR 68 | Temp 97.6°F | Resp 18 | Ht 67.0 in | Wt 229.0 lb

## 2013-11-09 DIAGNOSIS — I509 Heart failure, unspecified: Secondary | ICD-10-CM

## 2013-11-09 MED ORDER — METOLAZONE 5 MG PO TABS: 5.0000 mg | ORAL_TABLET | Freq: Every day | ORAL | Status: DC

## 2013-11-09 NOTE — Telephone Encounter (Signed)
Pt has been seeing [facility] for adjustment of his medications related to his CHF.  He has ab appointment today.

## 2013-11-09 NOTE — Telephone Encounter (Signed)
Finally able to reach patient with lab results.  States he has been using his lasix BID as instructed but edema is much worse.  Feels he needs to see provider today.

## 2013-11-09 NOTE — Progress Notes (Signed)
Subjective:    Patient ID: [name], male    DOB: [birth year], 80 y.o.   MRN: [id number]  HPI I have reviewed the last 3 office visits at this office. The patient continues to gain weight and experienced significant edema. His most recent weights are listed below.  Wt Readings from Last 3 Encounters:  11/09/13 229 lb (103.874 kg)  11/01/13 224 lb (101.606 kg)  10/20/13 219 lb (99.338 kg)   He also complains of orthopnea and dyspnea on exertion. He denies any chest pain. He denies any fevers or chills or cough. He is currently taking Lasix 20 mg twice a day. Past Medical History  Diagnosis Date  . Hypertension   . OA (osteoarthritis)     rt leg  . ED (erectile dysfunction)   . Chronic fatigue   . Gout   . Bowing of leg   . Spinal stenosis   . Prostate cancer   . CHF (congestive heart failure)    Current Outpatient Prescriptions on File Prior to Visit  Medication Sig Dispense Refill  . Cyanocobalamin (VITAMIN B12 PO) Take 1 tablet by mouth every morning.       . diclofenac sodium (VOLTAREN) 1 % GEL Apply 4 g topically 4 (four) times daily as needed. Pain  100 g  1  . fenofibrate (TRICOR) 145 MG tablet TAKE ONE (1) TABLET BY MOUTH EVERY DAY  30 tablet  5  . fish oil-omega-3 fatty acids 1000 MG capsule Take 2 g by mouth every morning.      . fluticasone (FLONASE) 50 MCG/ACT nasal spray Place 2 sprays into the nose daily.  16 g  11  . furosemide (LASIX) 40 MG tablet Take 20 mg by mouth 2 (two) times daily.      . Menthol-Methyl Salicylate (MUSCLE RUB) 10-15 % CREA Apply 1 application topically daily as needed (FOR PAIN).      Marland Kitchen metoprolol (LOPRESSOR) 100 MG tablet Take 0.5 tablets (50 mg total) by mouth 2 (two) times daily.  30 tablet  5  . sildenafil (VIAGRA) 50 MG tablet Take 50 mg by mouth daily as needed.      . tamsulosin (FLOMAX) 0.4 MG CAPS capsule Take 1 capsule (0.4 mg total) by mouth daily.  30 capsule  3  . traMADol (ULTRAM) 50 MG tablet TAKE ONE TABLET EVERY SIX  HOURS AS NEEDED FOR PAIN  40 tablet  2   No current facility-administered medications on file prior to visit.   No Known Allergies History   Social History  . Marital Status: Divorced    Spouse Name: N/A    Number of Children: 24  . Years of Education: N/A   Occupational History  .     Social History Main Topics  . Smoking status: Former Research scientist (life sciences)  . Smokeless tobacco: Never Used     Comment: QUIT SMOKING  MANY YEARS AGO"  . Alcohol Use: No  . Drug Use: No  . Sexual Activity: Not on file   Other Topics Concern  . Not on file   Social History Narrative   Four adopted children.  Lives alone.      Review of Systems  All other systems reviewed and are negative.       Objective:   Physical Exam  Vitals reviewed. Neck: No JVD present.  Cardiovascular: Normal rate, regular rhythm and normal heart sounds.   No murmur heard. Pulmonary/Chest: Effort normal. He has rales.  Abdominal: Soft. Bowel sounds are normal.  Musculoskeletal: He exhibits edema.   patient has +2 edema and by basilar crackles        Assessment & Plan:  1. CHF, acute on chronic Zaroxolyn 5 mg by mouth x1, weight 30 minutes and then take 80 mg of Lasix. Recheck in 24 hours or sooner if worse.  I believe the patient will likely need to stay on Lasix 80 mg by mouth daily and some type of potassium supplement. - COMPLETE METABOLIC PANEL WITH GFR - Pro b natriuretic peptide (BNP)

## 2013-11-10 ENCOUNTER — Telehealth: Payer: Self-pay | Admitting: Cardiology

## 2013-11-10 LAB — COMPLETE METABOLIC PANEL WITH GFR
ALT: 13 U/L (ref 0–53)
AST: 28 U/L (ref 0–37)
Albumin: 3.7 g/dL (ref 3.5–5.2)
Alkaline Phosphatase: 34 U/L — ABNORMAL LOW (ref 39–117)
BILIRUBIN TOTAL: 0.9 mg/dL (ref 0.2–1.2)
BUN: 24 mg/dL — AB (ref 6–23)
CHLORIDE: 109 meq/L (ref 96–112)
CO2: 22 mEq/L (ref 19–32)
CREATININE: 1.32 mg/dL (ref 0.50–1.35)
Calcium: 9.2 mg/dL (ref 8.4–10.5)
GFR, EST AFRICAN AMERICAN: 58 mL/min — AB
GFR, Est Non African American: 50 mL/min — ABNORMAL LOW
Glucose, Bld: 104 mg/dL — ABNORMAL HIGH (ref 70–99)
Potassium: 4.5 mEq/L (ref 3.5–5.3)
Sodium: 138 mEq/L (ref 135–145)
Total Protein: 6.3 g/dL (ref 6.0–8.3)

## 2013-11-10 LAB — PRO B NATRIURETIC PEPTIDE: PRO B NATRI PEPTIDE: 2550 pg/mL — AB (ref <451)

## 2013-11-10 NOTE — Telephone Encounter (Signed)
New message     Talk to a nurse regarding update on patients CHF.

## 2013-11-10 NOTE — Telephone Encounter (Signed)
Left message to call back  

## 2013-11-11 ENCOUNTER — Ambulatory Visit (INDEPENDENT_AMBULATORY_CARE_PROVIDER_SITE_OTHER): Payer: Medicare Other | Admitting: Family Medicine

## 2013-11-11 ENCOUNTER — Encounter: Payer: Self-pay | Admitting: Family Medicine

## 2013-11-11 VITALS — BP 150/70 | HR 64 | Temp 97.0°F | Resp 18 | Ht 67.0 in | Wt 220.0 lb

## 2013-11-11 DIAGNOSIS — I509 Heart failure, unspecified: Secondary | ICD-10-CM

## 2013-11-11 MED ORDER — POTASSIUM CHLORIDE CRYS ER 20 MEQ PO TBCR: 20.0000 meq | EXTENDED_RELEASE_TABLET | Freq: Every day | ORAL | Status: DC

## 2013-11-11 NOTE — Progress Notes (Signed)
Subjective:    Patient ID: [name], male    DOB: 1928/09/01, 80 y.o.   MRN: [id number]  HPI 11/09/13 I have reviewed the last 3 office visits at this office. The patient continues to gain weight and experienced significant edema. His most recent weights are listed below.  Wt Readings from Last 3 Encounters:  11/11/13 220 lb (99.791 kg)  11/09/13 229 lb (103.874 kg)  11/01/13 224 lb (101.606 kg)   He also complains of orthopnea and dyspnea on exertion. He denies any chest pain. He denies any fevers or chills or cough. He is currently taking Lasix 20 mg twice a day.  At that time, my plan was: 1. CHF, acute on chronic Zaroxolyn 5 mg by mouth x1, weight 30 minutes and then take 80 mg of Lasix. Recheck in 24 hours or sooner if worse.  I believe the patient will likely need to stay on Lasix 80 mg by mouth daily and some type of potassium supplement. - COMPLETE METABOLIC PANEL WITH GFR - Pro b natriuretic peptide (BNP)  11/11/13 Patient is here today for a recheck. He has lost 9 pounds since his last office visit. The swelling in his legs is much better. His dyspnea on exertion is much better. He denies any chest pain. He denies any cramping. Past Medical History  Diagnosis Date  . Hypertension   . OA (osteoarthritis)     rt leg  . ED (erectile dysfunction)   . Chronic fatigue   . Gout   . Bowing of leg   . Spinal stenosis   . Prostate cancer   . CHF (congestive heart failure)    Current Outpatient Prescriptions on File Prior to Visit  Medication Sig Dispense Refill  . Cyanocobalamin (VITAMIN B12 PO) Take 1 tablet by mouth every morning.       . diclofenac sodium (VOLTAREN) 1 % GEL Apply 4 g topically 4 (four) times daily as needed. Pain  100 g  1  . fenofibrate (TRICOR) 145 MG tablet TAKE ONE (1) TABLET BY MOUTH EVERY DAY  30 tablet  5  . fish oil-omega-3 fatty acids 1000 MG capsule Take 2 g by mouth every morning.      . fluticasone (FLONASE) 50 MCG/ACT nasal spray Place 2  sprays into the nose daily.  16 g  11  . furosemide (LASIX) 40 MG tablet Take 20 mg by mouth 2 (two) times daily.      . Menthol-Methyl Salicylate (MUSCLE RUB) 10-15 % CREA Apply 1 application topically daily as needed (FOR PAIN).      Marland Kitchen metolazone (ZAROXOLYN) 5 MG tablet Take 1 tablet (5 mg total) by mouth daily.  30 tablet  0  . metoprolol (LOPRESSOR) 100 MG tablet Take 0.5 tablets (50 mg total) by mouth 2 (two) times daily.  30 tablet  5  . sildenafil (VIAGRA) 50 MG tablet Take 50 mg by mouth daily as needed.      . tamsulosin (FLOMAX) 0.4 MG CAPS capsule Take 1 capsule (0.4 mg total) by mouth daily.  30 capsule  3  . traMADol (ULTRAM) 50 MG tablet TAKE ONE TABLET EVERY SIX HOURS AS NEEDED FOR PAIN  40 tablet  2   No current facility-administered medications on file prior to visit.   No Known Allergies History   Social History  . Marital Status: Divorced    Spouse Name: N/A    Number of Children: 4  . Years of Education: N/A   Occupational  History  .     Social History Main Topics  . Smoking status: Former Research scientist (life sciences)  . Smokeless tobacco: Never Used     Comment: QUIT SMOKING  MANY YEARS AGO"  . Alcohol Use: No  . Drug Use: No  . Sexual Activity: Not on file   Other Topics Concern  . Not on file   Social History Narrative   Four adopted children.  Lives alone.      Review of Systems  All other systems reviewed and are negative.      Objective:   Physical Exam  Vitals reviewed. Neck: No JVD present.  Cardiovascular: Normal rate, regular rhythm and normal heart sounds.   No murmur heard. Pulmonary/Chest: Effort normal.  Abdominal: Soft. Bowel sounds are normal.  Musculoskeletal: He exhibits edema.   patient has +1 edema         Assessment & Plan:  1. CHF, acute on chronic Discontinue Zaroxolyn.  Continue Lasix 80 mg by mouth every morning. K-Dur 20 milliequivalents by mouth daily. Recheck on Monday. At that time I will repeat a BMP. If his weight continues to  drop or if his kidney function show strain we will need to decrease the dose of Lasix.

## 2013-11-15 ENCOUNTER — Ambulatory Visit: Payer: Medicare Other | Admitting: Family Medicine

## 2013-11-16 ENCOUNTER — Ambulatory Visit (INDEPENDENT_AMBULATORY_CARE_PROVIDER_SITE_OTHER): Payer: Medicare Other | Admitting: Family Medicine

## 2013-11-16 ENCOUNTER — Encounter: Payer: Self-pay | Admitting: Family Medicine

## 2013-11-16 VITALS — BP 98/52 | HR 60 | Temp 96.7°F | Resp 16 | Ht 67.0 in | Wt 213.0 lb

## 2013-11-16 DIAGNOSIS — I509 Heart failure, unspecified: Secondary | ICD-10-CM

## 2013-11-16 LAB — COMPLETE METABOLIC PANEL WITH GFR
ALK PHOS: 41 U/L (ref 39–117)
ALT: 17 U/L (ref 0–53)
AST: 43 U/L — AB (ref 0–37)
Albumin: 4 g/dL (ref 3.5–5.2)
BILIRUBIN TOTAL: 1.1 mg/dL (ref 0.2–1.2)
BUN: 28 mg/dL — ABNORMAL HIGH (ref 6–23)
CO2: 28 mEq/L (ref 19–32)
Calcium: 9.8 mg/dL (ref 8.4–10.5)
Chloride: 96 mEq/L (ref 96–112)
Creat: 1.6 mg/dL — ABNORMAL HIGH (ref 0.50–1.35)
GFR, EST NON AFRICAN AMERICAN: 40 mL/min — AB
GFR, Est African American: 46 mL/min — ABNORMAL LOW
Glucose, Bld: 118 mg/dL — ABNORMAL HIGH (ref 70–99)
POTASSIUM: 3.6 meq/L (ref 3.5–5.3)
SODIUM: 137 meq/L (ref 135–145)
TOTAL PROTEIN: 7.3 g/dL (ref 6.0–8.3)

## 2013-11-16 NOTE — Progress Notes (Signed)
Subjective:    Patient ID: [name], male    DOB: [birth year], 80 y.o.   MRN: [id number]  HPI 11/09/13 I have reviewed the last 3 office visits at this office. The patient continues to gain weight and experienced significant edema. His most recent weights are listed below.  Wt Readings from Last 3 Encounters:  11/16/13 213 lb (96.616 kg)  11/11/13 220 lb (99.791 kg)  11/09/13 229 lb (103.874 kg)   He also complains of orthopnea and dyspnea on exertion. He denies any chest pain. He denies any fevers or chills or cough. He is currently taking Lasix 20 mg twice a day.  At that time, my plan was: 1. CHF, acute on chronic Zaroxolyn 5 mg by mouth x1, weight 30 minutes and then take 80 mg of Lasix. Recheck in 24 hours or sooner if worse.  I believe the patient will likely need to stay on Lasix 80 mg by mouth daily and some type of potassium supplement. - COMPLETE METABOLIC PANEL WITH GFR - Pro b natriuretic peptide (BNP)  11/11/13 Patient is here today for a recheck. He has lost 9 pounds since his last office visit. The swelling in his legs is much better. His dyspnea on exertion is much better. He denies any chest pain. He denies any cramping.  At that time, my plan was: 1. CHF, acute on chronic Discontinue Zaroxolyn.  Continue Lasix 80 mg by mouth every morning. K-Dur 20 milliequivalents by mouth daily. Recheck on Monday. At that time I will repeat a BMP. If his weight continues to drop or if his kidney function show strain we will need to decrease the dose of Lasix.  11/16/13 He has lost 7 additional pounds.  There is no edema in his legs at this time. He states that he is not taking Zaroxolyn. He is taking Lasix 80 mg every day and K-Dur 20 milliequivalents every day however I am not sure as the patient seems confused.  In any regard he is no longer fluid overloaded.  Past Medical History  Diagnosis Date  . Hypertension   . OA (osteoarthritis)     rt leg  . ED (erectile dysfunction)     . Chronic fatigue   . Gout   . Bowing of leg   . Spinal stenosis   . Prostate cancer   . CHF (congestive heart failure)    Current Outpatient Prescriptions on File Prior to Visit  Medication Sig Dispense Refill  . Cyanocobalamin (VITAMIN B12 PO) Take 1 tablet by mouth every morning.       . diclofenac sodium (VOLTAREN) 1 % GEL Apply 4 g topically 4 (four) times daily as needed. Pain  100 g  1  . fenofibrate (TRICOR) 145 MG tablet TAKE ONE (1) TABLET BY MOUTH EVERY DAY  30 tablet  5  . fish oil-omega-3 fatty acids 1000 MG capsule Take 2 g by mouth every morning.      . fluticasone (FLONASE) 50 MCG/ACT nasal spray Place 2 sprays into the nose daily.  16 g  11  . furosemide (LASIX) 40 MG tablet Take 40 mg by mouth daily.       . Menthol-Methyl Salicylate (MUSCLE RUB) 10-15 % CREA Apply 1 application topically daily as needed (FOR PAIN).      Marland Kitchen metoprolol (LOPRESSOR) 100 MG tablet Take 0.5 tablets (50 mg total) by mouth 2 (two) times daily.  30 tablet  5  . potassium chloride SA (K-DUR,KLOR-CON) 20 MEQ tablet  Take 1 tablet (20 mEq total) by mouth daily.  30 tablet  3  . sildenafil (VIAGRA) 50 MG tablet Take 50 mg by mouth daily as needed.      . tamsulosin (FLOMAX) 0.4 MG CAPS capsule Take 1 capsule (0.4 mg total) by mouth daily.  30 capsule  3  . traMADol (ULTRAM) 50 MG tablet TAKE ONE TABLET EVERY SIX HOURS AS NEEDED FOR PAIN  40 tablet  2   No current facility-administered medications on file prior to visit.   No Known Allergies History   Social History  . Marital Status: Divorced    Spouse Name: N/A    Number of Children: 73  . Years of Education: N/A   Occupational History  .     Social History Main Topics  . Smoking status: Former Research scientist (life sciences)  . Smokeless tobacco: Never Used     Comment: QUIT SMOKING  MANY YEARS AGO"  . Alcohol Use: No  . Drug Use: No  . Sexual Activity: Not on file   Other Topics Concern  . Not on file   Social History Narrative   Four adopted children.   Lives alone.      Review of Systems  All other systems reviewed and are negative.      Objective:   Physical Exam  Vitals reviewed. Neck: No JVD present.  Cardiovascular: Normal rate, regular rhythm and normal heart sounds.   No murmur heard. Pulmonary/Chest: Effort normal.  Abdominal: Soft. Bowel sounds are normal.  Musculoskeletal: He exhibits no edema.          Assessment & Plan:   1. CHF (congestive heart failure) I believe today represents the patient's dry weight.  He weighs 213 pounds on my scale. He is 206 pounds without clothing on his scales at home. I told patient he should weigh himself everyday.  He should take Lasix 40 mg every day no matter what. If his weight rises more than 20 pounds he should take 80 mg of Lasix. He takes 80 mg of Lasix he should take potassium with it. I gave this plan in writing to the patient. - COMPLETE METABOLIC PANEL WITH GFR

## 2013-11-18 NOTE — Telephone Encounter (Signed)
Never received a call back

## 2013-12-02 ENCOUNTER — Other Ambulatory Visit: Payer: Self-pay | Admitting: Family Medicine

## 2013-12-02 ENCOUNTER — Ambulatory Visit: Payer: Medicare Other | Admitting: Family Medicine

## 2013-12-02 NOTE — Telephone Encounter (Signed)
ok 

## 2013-12-02 NOTE — Telephone Encounter (Signed)
Medication called to pharmacy. 

## 2013-12-02 NOTE — Telephone Encounter (Signed)
?   OK to Refill  

## 2013-12-03 ENCOUNTER — Encounter: Payer: Self-pay | Admitting: Family Medicine

## 2013-12-03 ENCOUNTER — Ambulatory Visit (INDEPENDENT_AMBULATORY_CARE_PROVIDER_SITE_OTHER): Payer: Medicare Other | Admitting: Family Medicine

## 2013-12-03 VITALS — BP 140/72 | HR 76 | Temp 96.5°F | Resp 20 | Ht 67.0 in | Wt 223.0 lb

## 2013-12-03 DIAGNOSIS — I509 Heart failure, unspecified: Secondary | ICD-10-CM

## 2013-12-03 LAB — BASIC METABOLIC PANEL WITH GFR
BUN: 16 mg/dL (ref 6–23)
CHLORIDE: 105 meq/L (ref 96–112)
CO2: 24 mEq/L (ref 19–32)
Calcium: 9 mg/dL (ref 8.4–10.5)
Creat: 1.08 mg/dL (ref 0.50–1.35)
GFR, Est African American: 74 mL/min
GFR, Est Non African American: 64 mL/min
Glucose, Bld: 111 mg/dL — ABNORMAL HIGH (ref 70–99)
Potassium: 4 mEq/L (ref 3.5–5.3)
Sodium: 138 mEq/L (ref 135–145)

## 2013-12-03 NOTE — Progress Notes (Signed)
Subjective:    Patient ID: [name], male    DOB: 1929/03/14, 80 y.o.   MRN: [id number]  HPI 11/09/13 I have reviewed the last 3 office visits at this office. The patient continues to gain weight and experienced significant edema. His most recent weights are listed below.  Wt Readings from Last 3 Encounters:  12/03/13 223 lb (101.152 kg)  11/16/13 213 lb (96.616 kg)  11/11/13 220 lb (99.791 kg)   He also complains of orthopnea and dyspnea on exertion. He denies any chest pain. He denies any fevers or chills or cough. He is currently taking Lasix 20 mg twice a day.  At that time, my plan was: 1. CHF, acute on chronic Zaroxolyn 5 mg by mouth x1, weight 30 minutes and then take 80 mg of Lasix. Recheck in 24 hours or sooner if worse.  I believe the patient will likely need to stay on Lasix 80 mg by mouth daily and some type of potassium supplement. - COMPLETE METABOLIC PANEL WITH GFR - Pro b natriuretic peptide (BNP)  11/11/13 Patient is here today for a recheck. He has lost 9 pounds since his last office visit. The swelling in his legs is much better. His dyspnea on exertion is much better. He denies any chest pain. He denies any cramping.  At that time, my plan was: 1. CHF, acute on chronic Discontinue Zaroxolyn.  Continue Lasix 80 mg by mouth every morning. K-Dur 20 milliequivalents by mouth daily. Recheck on Monday. At that time I will repeat a BMP. If his weight continues to drop or if his kidney function show strain we will need to decrease the dose of Lasix.  11/16/13 He has lost 7 additional pounds.  There is no edema in his legs at this time. He states that he is not taking Zaroxolyn. He is taking Lasix 80 mg every day and K-Dur 20 milliequivalents every day however I am not sure as the patient seems confused.  In any regard he is no longer fluid overloaded.  At that time, my plan was: 1. CHF (congestive heart failure) I believe today represents the patient's dry weight.  He  weighs 213 pounds on my scale. He is 206 pounds without clothing on his scales at home. I told patient he should weigh himself everyday.  He should take Lasix 40 mg every day no matter what. If his weight rises more than 20 pounds he should take 80 mg of Lasix. He takes 80 mg of Lasix he should take potassium with it. I gave this plan in writing to the patient. - COMPLETE METABOLIC PANEL WITH GFR  03/06/49 Patient's weight is up to 223 pounds today. This is 10 pounds greater than his weight on April 14. He is currently taking Lasix 40 mg every day. He states that he is not gaining weight at home. He attributes this weight to wearing heavy boots today.  He states that according to his scales at home he has gained 2 pounds this morning.  He does have some mild shortness of breath. On examination he has some faint left basilar crackles. He also has some trace pretibial edema. He is nowhere near as bad as it was initially.   Past Medical History  Diagnosis Date  . Hypertension   . OA (osteoarthritis)     rt leg  . ED (erectile dysfunction)   . Chronic fatigue   . Gout   . Bowing of leg   . Spinal stenosis   .  Prostate cancer   . CHF (congestive heart failure)    Current Outpatient Prescriptions on File Prior to Visit  Medication Sig Dispense Refill  . Cyanocobalamin (VITAMIN B12 PO) Take 1 tablet by mouth every morning.       . diclofenac sodium (VOLTAREN) 1 % GEL Apply 4 g topically 4 (four) times daily as needed. Pain  100 g  1  . fenofibrate (TRICOR) 145 MG tablet TAKE ONE (1) TABLET BY MOUTH EVERY DAY  30 tablet  5  . fish oil-omega-3 fatty acids 1000 MG capsule Take 2 g by mouth every morning.      . fluticasone (FLONASE) 50 MCG/ACT nasal spray Place 2 sprays into the nose daily.  16 g  11  . furosemide (LASIX) 40 MG tablet Take 40 mg by mouth daily.       . Menthol-Methyl Salicylate (MUSCLE RUB) 10-15 % CREA Apply 1 application topically daily as needed (FOR PAIN).      Marland Kitchen metoprolol  (LOPRESSOR) 100 MG tablet Take 0.5 tablets (50 mg total) by mouth 2 (two) times daily.  30 tablet  5  . potassium chloride SA (K-DUR,KLOR-CON) 20 MEQ tablet Take 1 tablet (20 mEq total) by mouth daily.  30 tablet  3  . sildenafil (VIAGRA) 50 MG tablet Take 50 mg by mouth daily as needed.      . tamsulosin (FLOMAX) 0.4 MG CAPS capsule Take 1 capsule (0.4 mg total) by mouth daily.  30 capsule  3  . traMADol (ULTRAM) 50 MG tablet TAKE ONE TABLET EVERY 6 HOURS AS NEEDED FOR PAIN.  40 tablet  2   No current facility-administered medications on file prior to visit.   No Known Allergies History   Social History  . Marital Status: Divorced    Spouse Name: N/A    Number of Children: 64  . Years of Education: N/A   Occupational History  .     Social History Main Topics  . Smoking status: Former Research scientist (life sciences)  . Smokeless tobacco: Never Used     Comment: QUIT SMOKING  MANY YEARS AGO"  . Alcohol Use: No  . Drug Use: No  . Sexual Activity: Not on file   Other Topics Concern  . Not on file   Social History Narrative   Four adopted children.  Lives alone.      Review of Systems  All other systems reviewed and are negative.      Objective:   Physical Exam  Vitals reviewed. Neck: No JVD present.  Cardiovascular: Normal rate, regular rhythm and normal heart sounds.   No murmur heard. Pulmonary/Chest: Effort normal. He has rales.  Abdominal: Soft. Bowel sounds are normal.  Musculoskeletal: He exhibits edema.          Assessment & Plan:   1. CHF (congestive heart failure) Overall I think the patient is doing well by taking Lasix 40 mg every day and using 80 mg sparingly on an as-needed basis. He is retaining a trace amount of extra fluid today. Therefore I recommended that he use 80 mg of Lasix in the morning and then resume his previous schedule. I will check a BMP. The patient's last visit his creatinine had risen from 1.3-1.6 on increased diuresis. Make sure this is stable. If all  of his creatinine is ranging between 1.3 and 1.6, I will make no changes to his medication regimen. - BASIC METABOLIC PANEL WITH GFR

## 2013-12-14 ENCOUNTER — Encounter: Payer: Self-pay | Admitting: Family Medicine

## 2013-12-14 ENCOUNTER — Emergency Department (HOSPITAL_COMMUNITY)
Admission: EM | Admit: 2013-12-14 | Discharge: 2013-12-14 | Disposition: A | Discharge status: Home / Self Care | Payer: Medicare Other | Attending: Emergency Medicine | Admitting: Emergency Medicine

## 2013-12-14 ENCOUNTER — Encounter (HOSPITAL_COMMUNITY): Payer: Self-pay | Admitting: Emergency Medicine

## 2013-12-14 ENCOUNTER — Ambulatory Visit (INDEPENDENT_AMBULATORY_CARE_PROVIDER_SITE_OTHER): Payer: Medicare Other | Admitting: Family Medicine

## 2013-12-14 VITALS — BP 94/62 | HR 128 | Temp 96.7°F | Resp 18 | Ht 67.0 in | Wt 218.0 lb

## 2013-12-14 DIAGNOSIS — I10 Essential (primary) hypertension: Secondary | ICD-10-CM | POA: Insufficient documentation

## 2013-12-14 DIAGNOSIS — Z8546 Personal history of malignant neoplasm of prostate: Secondary | ICD-10-CM | POA: Insufficient documentation

## 2013-12-14 DIAGNOSIS — Z791 Long term (current) use of non-steroidal anti-inflammatories (NSAID): Secondary | ICD-10-CM | POA: Insufficient documentation

## 2013-12-14 DIAGNOSIS — I498 Other specified cardiac arrhythmias: Secondary | ICD-10-CM | POA: Insufficient documentation

## 2013-12-14 DIAGNOSIS — Z87448 Personal history of other diseases of urinary system: Secondary | ICD-10-CM | POA: Insufficient documentation

## 2013-12-14 DIAGNOSIS — Z87891 Personal history of nicotine dependence: Secondary | ICD-10-CM | POA: Insufficient documentation

## 2013-12-14 DIAGNOSIS — Z8739 Personal history of other diseases of the musculoskeletal system and connective tissue: Secondary | ICD-10-CM | POA: Insufficient documentation

## 2013-12-14 DIAGNOSIS — IMO0002 Reserved for concepts with insufficient information to code with codable children: Secondary | ICD-10-CM | POA: Insufficient documentation

## 2013-12-14 DIAGNOSIS — Z79899 Other long term (current) drug therapy: Secondary | ICD-10-CM | POA: Insufficient documentation

## 2013-12-14 DIAGNOSIS — I509 Heart failure, unspecified: Secondary | ICD-10-CM | POA: Insufficient documentation

## 2013-12-14 DIAGNOSIS — M1990 Unspecified osteoarthritis, unspecified site: Secondary | ICD-10-CM | POA: Insufficient documentation

## 2013-12-14 DIAGNOSIS — I471 Supraventricular tachycardia: Secondary | ICD-10-CM

## 2013-12-14 DIAGNOSIS — M109 Gout, unspecified: Secondary | ICD-10-CM | POA: Insufficient documentation

## 2013-12-14 DIAGNOSIS — R000 Tachycardia, unspecified: Secondary | ICD-10-CM

## 2013-12-14 LAB — BASIC METABOLIC PANEL
BUN: 26 mg/dL — ABNORMAL HIGH (ref 6–23)
CALCIUM: 9.6 mg/dL (ref 8.4–10.5)
CO2: 23 meq/L (ref 19–32)
CREATININE: 1.34 mg/dL (ref 0.50–1.35)
Chloride: 108 mEq/L (ref 96–112)
GFR calc Af Amer: 55 mL/min — ABNORMAL LOW (ref >90)
GFR calc non Af Amer: 48 mL/min — ABNORMAL LOW (ref >90)
GLUCOSE: 119 mg/dL — AB (ref 70–99)
Potassium: 4.2 mEq/L (ref 3.7–5.3)
Sodium: 142 mEq/L (ref 137–147)

## 2013-12-14 LAB — CBC WITH DIFFERENTIAL/PLATELET
BASOS ABS: 0 10*3/uL (ref 0.0–0.1)
Basophils Relative: 1 % (ref 0–1)
EOS ABS: 0.2 10*3/uL (ref 0.0–0.7)
EOS PCT: 3 % (ref 0–5)
HEMATOCRIT: 45.6 % (ref 39.0–52.0)
Hemoglobin: 15.2 g/dL (ref 13.0–17.0)
LYMPHS PCT: 35 % (ref 12–46)
Lymphs Abs: 2.2 10*3/uL (ref 0.7–4.0)
MCH: 31.5 pg (ref 26.0–34.0)
MCHC: 33.3 g/dL (ref 30.0–36.0)
MCV: 94.6 fL (ref 78.0–100.0)
Monocytes Absolute: 0.6 10*3/uL (ref 0.1–1.0)
Monocytes Relative: 10 % (ref 3–12)
Neutro Abs: 3.3 10*3/uL (ref 1.7–7.7)
Neutrophils Relative %: 51 % (ref 43–77)
Platelets: 141 10*3/uL — ABNORMAL LOW (ref 150–400)
RBC: 4.82 MIL/uL (ref 4.22–5.81)
RDW: 14.1 % (ref 11.5–15.5)
WBC: 6.3 10*3/uL (ref 4.0–10.5)

## 2013-12-14 LAB — TROPONIN I

## 2013-12-14 MED ORDER — ADENOSINE 6 MG/2ML IV SOLN
6.0000 mg | Freq: Once | INTRAVENOUS | Status: AC
Start: 2013-12-14 — End: 2013-12-14
  Administered 2013-12-14: 6 mg via INTRAVENOUS
  Filled 2013-12-14: qty 2

## 2013-12-14 MED ORDER — ADENOSINE 6 MG/2ML IV SOLN
INTRAVENOUS | Status: AC
  Filled 2013-12-14: qty 4

## 2013-12-14 MED ORDER — SODIUM CHLORIDE 0.9 % IV BOLUS (SEPSIS)
500.0000 mL | Freq: Once | INTRAVENOUS | Status: AC
  Administered 2013-12-14: 500 mL via INTRAVENOUS

## 2013-12-14 MED ORDER — ADENOSINE 6 MG/2ML IV SOLN
12.0000 mg | Freq: Once | INTRAVENOUS | Status: AC
  Administered 2013-12-14: 12 mg via INTRAVENOUS

## 2013-12-14 NOTE — ED Notes (Signed)
Pt converted in NSR at this time. EKG done and given to EDP

## 2013-12-14 NOTE — ED Notes (Signed)
Provider at the bedside.  

## 2013-12-14 NOTE — Discharge Instructions (Signed)
Supraventricular Tachycardia °Supraventricular tachycardia (SVT) is an abnormal heart rhythm (arrhythmia) that causes the heart to beat very fast (tachycardia). This kind of fast heartbeat originates in the upper chambers of the heart (atria). SVT can cause the heart to beat greater than 100 beats per minute. SVT can have a rapid burst of heartbeats. This can start and stop suddenly without warning and is called nonsustained. SVT can also be sustained, in which the heart beats at a continuous fast rate.  °CAUSES  °There can be different causes of SVT. Some of these include: °· Heart valve problems such as mitral valve prolapse. °· An enlarged heart (hypertrophic cardiomyopathy). °· Congenital heart problems. °· Heart inflammation (pericarditis). °· Hyperthyroidism. °· Low potassium or magnesium levels. °· Caffeine. °· Drug use such as cocaine, methamphetamines, or stimulants. °· Some over-the-counter medicines such as: °· Decongestants. °· Diet medicines. °· Herbal medicines. °SYMPTOMS  °Symptoms of SVT can vary. Symptoms depend on whether the SVT is sustained or nonsustained. You may experience: °· No symptoms (asymptomatic). °· An awareness of your heart beating rapidly (palpitations). °· Shortness of breath. °· Chest pain or pressure. °If your blood pressure drops because of the SVT, you may experience: °· Fainting or near fainting. °· Weakness. °· Dizziness. °DIAGNOSIS  °Different tests can be performed to diagnose SVT, such as: °· An electrocardiogram (EKG). This is a painless test that records the electrical activity of your heart. °· Holter monitor. This is a 24 hour recording of your heart rhythm. You will be given a diary. Write down all symptoms that you have and what you were doing at the time you experienced symptoms. °· Arrhythmia monitor. This is a small device that your wear for several weeks. It records the heart rhythm when you have symptoms. °· Echocardiogram. This is an imaging test to help detect  abnormal heart structure such as congenital abnormalities, heart valve problems, or heart enlargement. °· Stress test. This test can help determine if the SVT is related to exercise. °· Electrophysiology study (EPS). This is a procedure that evaluates your heart's electrical system and can help your caregiver find the cause of your SVT. °TREATMENT  °Treatment of SVT depends on the symptoms, how often it recurs, and whether there are any underlying heart problems.  °· If symptoms are rare and no other cardiac disease is present, no treatment may be needed. °· Blood work may be done to check potassium, magnesium, and thyroid hormone levels to see if they are abnormal. If these levels are abnormal, treatment to correct the problems will occur. °Medicines °Your caregiver may use oral medicines to treat SVT. These medicines are given for long-term control of SVT. Medicines may be used alone or in combination with other treatments. These medicines work to slow nerve impulses in the heart muscle. These medicines can also be used to treat high blood pressure. Some of these medicines may include: °· Calcium channel blockers. °· Beta blockers. °· Digoxin. °Nonsurgical procedures °Nonsurgical techniques may be used if oral medicines do not work. Some examples include: °· Cardioversion. This technique uses either drugs or an electrical shock to restore a normal heart rhythm. °· Cardioversion drugs may be given through an intravenous (IV) line to help "reset" the heart rhythm. °· In electrical cardioversion, the caregiver shocks your heart to stop its beat for a split second. This helps to reset the heart to a normal rhythm. °· Ablation. This procedure is done under mild sedation. High frequency radio wave energy is used to   destroy the area of heart tissue responsible for the SVT. °HOME CARE INSTRUCTIONS  °· Do not smoke. °· Only take medicines prescribed by your caregiver. Check with your caregiver before using over-the-counter  medicines. °· Check with your caregiver about how much alcohol and caffeine (coffee, tea, colas, or chocolate) you may have. °· It is very important to keep all follow-up referrals and appointments in order to properly manage this problem. °SEEK IMMEDIATE MEDICAL CARE IF: °· You have dizziness. °· You faint or nearly faint. °· You have shortness of breath. °· You have chest pain or pressure. °· You have sudden nausea or vomiting. °· You have profuse sweating. °· You are concerned about how long your symptoms last. °· You are concerned about the frequency of your SVT episodes. °If you have the above symptoms, call your local emergency services (911 in [place] immediately. Do not drive yourself to the hospital. °MAKE SURE YOU:  °· Understand these instructions. °· Will watch your condition. °· Will get help right away if you are not doing well or get worse. °Document Released: 07/22/2005 Document Revised: 10/14/2011 Document Reviewed: 11/03/2008 °[employer]® Patient Information ©2014 [employer]. ° °

## 2013-12-14 NOTE — ED Provider Notes (Signed)
CSN: [id number]     Arrival date & time 12/14/13  1456 History   First MD Initiated Contact with Patient 12/14/13 1507     Chief Complaint  Patient presents with  . Tachycardia     (Consider location/radiation/quality/duration/timing/severity/associated sxs/prior Treatment) HPI Pt brought to the ED via EMS from PCP office today where he went for 'feeling funny', found to be tachycardic with ?SVT not resolved with vagal maneuvers or carotid massage. Pt denies any CP or SOB. States this has happened to him several times in the past and he was able to treat it at home with submerging his face in a bucket of ice water.   Past Medical History  Diagnosis Date  . Hypertension   . OA (osteoarthritis)     rt leg  . ED (erectile dysfunction)   . Chronic fatigue   . Gout   . Bowing of leg   . Spinal stenosis   . Prostate cancer   . CHF (congestive heart failure)    Past Surgical History  Procedure Laterality Date  . Back surgery x 2    . Rt rotator cuff repair    . Cervical spine surgery    . Back surgery     Family History  Problem Relation Age of Onset  . Heart failure Mother 35   History  Substance Use Topics  . Smoking status: Former Research scientist (life sciences)  . Smokeless tobacco: Never Used     Comment: QUIT SMOKING  MANY YEARS AGO"  . Alcohol Use: No    Review of Systems All other systems reviewed and are negative except as noted in HPI.     Allergies  Review of patient's allergies indicates no known allergies.  Home Medications   Prior to Admission medications   Medication Sig Start Date End Date Taking? Authorizing Provider  diclofenac sodium (VOLTAREN) 1 % GEL Apply 4 g topically 4 (four) times daily as needed (pain).   Yes Historical Provider, MD  fenofibrate (TRICOR) 145 MG tablet Take 145 mg by mouth daily.   Yes Historical Provider, MD  finasteride (PROSCAR) 5 MG tablet Take 5 mg by mouth daily.  11/10/13  Yes Historical Provider, MD  fish oil-omega-3 fatty acids 1000 MG  capsule Take 2 g by mouth daily.    Yes Historical Provider, MD  fluticasone (FLONASE) 50 MCG/ACT nasal spray Place 1 spray into both nostrils daily.   Yes Historical Provider, MD  Cyanocobalamin (VITAMIN B12 PO) Take 1 tablet by mouth every morning.     Historical Provider, MD  furosemide (LASIX) 40 MG tablet Take 40 mg by mouth daily.  02/12/13   [name], MD  Menthol-Methyl Salicylate (MUSCLE RUB) 10-15 % CREA Apply 1 application topically daily as needed (FOR PAIN).    Historical Provider, MD  metolazone (ZAROXOLYN) 5 MG tablet  11/09/13   Historical Provider, MD  metoprolol (LOPRESSOR) 100 MG tablet Take 0.5 tablets (50 mg total) by mouth 2 (two) times daily. 08/31/13   [name], MD  potassium chloride SA (K-DUR,KLOR-CON) 20 MEQ tablet Take 1 tablet (20 mEq total) by mouth daily. 11/11/13   [name], MD  sildenafil (VIAGRA) 50 MG tablet Take 50 mg by mouth daily as needed.    Historical Provider, MD  tamsulosin (FLOMAX) 0.4 MG CAPS capsule Take 1 capsule (0.4 mg total) by mouth daily. 04/01/13   [name], MD  traMADol (ULTRAM) 50 MG tablet TAKE ONE TABLET EVERY 6 HOURS AS NEEDED FOR PAIN. 12/02/13  [name], MD   BP 115/69  Pulse 135  Temp(Src) 98.5 F (36.9 C) (Oral)  Resp 18  Ht 5\' 7"  (1.702 m)  Wt 218 lb (98.884 kg)  BMI 34.14 kg/m2  SpO2 99% Physical Exam  Nursing note and vitals reviewed. Constitutional: He is oriented to person, place, and time. He appears well-developed and well-nourished.  HENT:  Head: Normocephalic and atraumatic.  Eyes: EOM are normal. Pupils are equal, round, and reactive to light.  Neck: Normal range of motion. Neck supple.  Cardiovascular: Normal heart sounds and intact distal pulses.  Tachycardia present.   Pulmonary/Chest: Effort normal and breath sounds normal.  Abdominal: Bowel sounds are normal. He exhibits no distension. There is no tenderness.  Musculoskeletal: Normal range of motion. He exhibits no edema and no  tenderness.  Neurological: He is alert and oriented to person, place, and time. He has normal strength. No cranial nerve deficit or sensory deficit.  Skin: Skin is warm and dry. No rash noted.  Psychiatric: He has a normal mood and affect.    ED Course  Procedures (including critical care time) Labs Review Labs Reviewed  CBC WITH DIFFERENTIAL  BASIC METABOLIC PANEL  TROPONIN I    Imaging Review No results found.   EKG Interpretation   Date/Time:  Tuesday Dec 14 2013 15:05:19 EDT Ventricular Rate:  134 PR Interval:    QRS Duration: 94 QT Interval:  334 QTC Calculation: 499 R Axis:   -62 Text Interpretation:  Junctional tachycardia vs SVT Markedly posterior QRS  axis Probable left ventricular hypertrophy Borderline prolonged QT  interval Since last tracing Rate faster Confirmed by SHELDON  MD, Juanda Crumble  [id number]) on 12/14/2013 3:21:50 PM      MDM   Final diagnoses:  SVT (supraventricular tachycardia)    Attempted to break tachycardia with submerging face in ice water several times without success. Will give adenosine.   4:30 PM No change with adenosine 6mg  or 12 mg. Planning metoprolol after 500cc fluid bolus but patient converted to NSR spontaneously and ready to go home.   Charles B. Karle Starch, MD 12/14/13 1630

## 2013-12-14 NOTE — Progress Notes (Signed)
Subjective:    Patient ID: [name], male    DOB: Feb 23, 1929, 80 y.o.   MRN: [id number]  HPI Patient was working in his yard this morning when he began to feel "funny" in his chest.  He checked his blood pressure and found it was low. He presented here today and walked in to be seen.  He complains of feeling week.  On initial evaluation he was found hypotensive at 94/62 heart rate of 120 beats per minute.  EKG reveals SVT with 141 bpm.  Patient denies any chest pain or shortness of breath. He denies any syncope or presyncope. He does weak and tired. Past Medical History  Diagnosis Date  . Hypertension   . OA (osteoarthritis)     rt leg  . ED (erectile dysfunction)   . Chronic fatigue   . Gout   . Bowing of leg   . Spinal stenosis   . Prostate cancer   . CHF (congestive heart failure)    Current Outpatient Prescriptions on File Prior to Visit  Medication Sig Dispense Refill  . Cyanocobalamin (VITAMIN B12 PO) Take 1 tablet by mouth every morning.       . diclofenac sodium (VOLTAREN) 1 % GEL Apply 4 g topically 4 (four) times daily as needed. Pain  100 g  1  . fenofibrate (TRICOR) 145 MG tablet TAKE ONE (1) TABLET BY MOUTH EVERY DAY  30 tablet  5  . fish oil-omega-3 fatty acids 1000 MG capsule Take 2 g by mouth every morning.      . fluticasone (FLONASE) 50 MCG/ACT nasal spray Place 2 sprays into the nose daily.  16 g  11  . furosemide (LASIX) 40 MG tablet Take 40 mg by mouth daily.       . Menthol-Methyl Salicylate (MUSCLE RUB) 10-15 % CREA Apply 1 application topically daily as needed (FOR PAIN).      Marland Kitchen metoprolol (LOPRESSOR) 100 MG tablet Take 0.5 tablets (50 mg total) by mouth 2 (two) times daily.  30 tablet  5  . potassium chloride SA (K-DUR,KLOR-CON) 20 MEQ tablet Take 1 tablet (20 mEq total) by mouth daily.  30 tablet  3  . sildenafil (VIAGRA) 50 MG tablet Take 50 mg by mouth daily as needed.      . tamsulosin (FLOMAX) 0.4 MG CAPS capsule Take 1 capsule (0.4 mg total) by  mouth daily.  30 capsule  3  . traMADol (ULTRAM) 50 MG tablet TAKE ONE TABLET EVERY 6 HOURS AS NEEDED FOR PAIN.  40 tablet  2   No current facility-administered medications on file prior to visit.   No Known Allergies History   Social History  . Marital Status: Divorced    Spouse Name: N/A    Number of Children: 11  . Years of Education: N/A   Occupational History  .     Social History Main Topics  . Smoking status: Former Research scientist (life sciences)  . Smokeless tobacco: Never Used     Comment: QUIT SMOKING  MANY YEARS AGO"  . Alcohol Use: No  . Drug Use: No  . Sexual Activity: Not on file   Other Topics Concern  . Not on file   Social History Narrative   Four adopted children.  Lives alone.        Review of Systems  All other systems reviewed and are negative.      Objective:   Physical Exam  Vitals reviewed. Constitutional: No distress.  HENT:  Head: Normocephalic  and atraumatic.  Neck: Neck supple. No JVD present.  Cardiovascular: Regular rhythm, S1 normal, S2 normal, normal heart sounds and normal pulses.  Tachycardia present.  PMI is not displaced.   No murmur heard. Pulmonary/Chest: Effort normal and breath sounds normal. No respiratory distress. He has no wheezes. He has no rales. He exhibits no tenderness.  Musculoskeletal: He exhibits no edema.  Skin: He is diaphoretic.          Assessment & Plan:  1. Tachycardia Patient is in SVT. Valsalva maneuvers were tried 3 separate occasions.  I was unable to break SVT with Valsalva maneuvers. EMS was called for transport to the ER. - EKG 12-Lead

## 2013-12-14 NOTE — ED Notes (Signed)
Pt to department from PCP office with possible SVT. Reports that he went there today because he was having episodes of diaphoresis. Denies any chest or SOB. Hr-138 Pt A&Ox4. States that he knows this is not his heart. 18g left hand

## 2014-01-10 ENCOUNTER — Ambulatory Visit (INDEPENDENT_AMBULATORY_CARE_PROVIDER_SITE_OTHER): Payer: Medicare Other | Admitting: Family Medicine

## 2014-01-10 ENCOUNTER — Encounter: Payer: Self-pay | Admitting: Family Medicine

## 2014-01-10 VITALS — BP 118/60 | HR 60 | Temp 96.8°F | Resp 16 | Ht 67.0 in | Wt 218.0 lb

## 2014-01-10 DIAGNOSIS — Z09 Encounter for follow-up examination after completed treatment for conditions other than malignant neoplasm: Secondary | ICD-10-CM

## 2014-01-10 DIAGNOSIS — R011 Cardiac murmur, unspecified: Secondary | ICD-10-CM

## 2014-01-10 NOTE — Progress Notes (Signed)
Subjective:    Patient ID: [name], male    DOB: 1929-06-08, 80 y.o.   MRN: [id number]  HPI 12/14/13 Patient was working in his yard this morning when he began to feel "funny" in his chest.  He checked his blood pressure and found it was low. He presented here today and walked in to be seen.  He complains of feeling week.  On initial evaluation he was found hypotensive at 94/62 heart rate of 120 beats per minute.  EKG reveals SVT with 141 bpm.  Patient denies any chest pain or shortness of breath. He denies any syncope or presyncope. He does weak and tired.  At that time, my plan was: 1. Tachycardia Patient is in SVT. Valsalva maneuvers were tried 3 separate occasions.  I was unable to break SVT with Valsalva maneuvers. EMS was called for transport to the ER. - EKG 12-Lead  01/10/14 Went to ER where patient was a given adenosine 6 mg and 12 mg. He did not respond to either does but spontaneously converted to normal sinus rhythm after a 500 cc bolus of IV fluids.  Patient is here today for followup. Since going to the emergency room, he has had no further episodes of tachycardia or palpitations. He is at no further presyncopal or syncopal episodes. At present time he is in normal sinus rhythm. His heart rate is well controlled at 60 beats per minute. His blood pressures excellent 118/60. His weight is stable and unchanged since last office visit at 218 pounds. He takes 40 mg of Lasix every day. He has substantial swelling he will take 80 mg of Lasix. I emphasized to the patient did not take more than 80 mg of Lasix and 1 day. Also emphasized that I did not want him taking metolazone without my recommendation to avoid prerenal azotemia.    EKG shows normal sinus rhythm with an incomplete right bundle branch block. Patient has nonspecific ST changes in leads 3 and aVF. Past Medical History  Diagnosis Date  . Hypertension   . OA (osteoarthritis)     rt leg  . ED (erectile dysfunction)   .  Chronic fatigue   . Gout   . Bowing of leg   . Spinal stenosis   . Prostate cancer   . CHF (congestive heart failure)    Current Outpatient Prescriptions on File Prior to Visit  Medication Sig Dispense Refill  . diclofenac sodium (VOLTAREN) 1 % GEL Apply 4 g topically 3 (three) times daily as needed (pain).       . fenofibrate (TRICOR) 145 MG tablet Take 145 mg by mouth daily.      . finasteride (PROSCAR) 5 MG tablet Take 5 mg by mouth daily.       . fish oil-omega-3 fatty acids 1000 MG capsule Take 2 g by mouth daily.       . fluticasone (FLONASE) 50 MCG/ACT nasal spray Place 2 sprays into both nostrils daily as needed (congestion).       . furosemide (LASIX) 40 MG tablet Take 40 mg by mouth See admin instructions. Take 2 tablets (80 mg) on Monday, take 1 tablet (40 mg) daily on all other days      . Menthol-Methyl Salicylate (MUSCLE RUB) 10-15 % CREA Apply 1 application topically daily as needed for muscle pain.       . metolazone (ZAROXOLYN) 5 MG tablet Take 5 mg by mouth daily.       . metoprolol (LOPRESSOR) 100  MG tablet Take 0.5 tablets (50 mg total) by mouth 2 (two) times daily.  30 tablet  5  . potassium chloride SA (K-DUR,KLOR-CON) 20 MEQ tablet Take 1 tablet (20 mEq total) by mouth daily.  30 tablet  3  . traMADol (ULTRAM) 50 MG tablet Take 50 mg by mouth daily as needed (pain).       . vitamin B-12 (CYANOCOBALAMIN) 1000 MCG tablet Take 1,000 mcg by mouth daily.       No current facility-administered medications on file prior to visit.   No Known Allergies History   Social History  . Marital Status: Divorced    Spouse Name: N/A    Number of Children: 58  . Years of Education: N/A   Occupational History  .     Social History Main Topics  . Smoking status: Former Research scientist (life sciences)  . Smokeless tobacco: Never Used     Comment: QUIT SMOKING  MANY YEARS AGO"  . Alcohol Use: No  . Drug Use: No  . Sexual Activity: Not on file   Other Topics Concern  . Not on file   Social  History Narrative   Four adopted children.  Lives alone.        Review of Systems  All other systems reviewed and are negative.      Objective:   Physical Exam  Vitals reviewed. Constitutional: No distress.  HENT:  Head: Normocephalic and atraumatic.  Neck: Neck supple. No JVD present.  Cardiovascular: Normal rate, regular rhythm, S1 normal, S2 normal and normal pulses.  PMI is not displaced.   Murmur heard. Pulmonary/Chest: Effort normal and breath sounds normal. No respiratory distress. He has no wheezes. He has no rales. He exhibits no tenderness.  Musculoskeletal: He exhibits no edema.          Assessment & Plan:  Hospital discharge follow-up - Plan: EKG 12-Lead, Ambulatory referral to Cardiology  Undiagnosed cardiac murmurs - Plan: Ambulatory referral to Cardiology  Patient was admitted to the hospital congestive heart failure in November of 2014. 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  Dr. Percival Spanish wanted to reassess this at his next office visit. The patient has not followed up again with cardiology.  Today on examination he does have a murmur in the area of the mitral valve. I will arrange cardiology followup to reassess this region with echocardiogram.  There has been no other episodes of SVT.  Continue metoprolol for rate control. Patient is euvolemic.  Having no changes in his diuretic dosing. I again emphasized to the patient not to take the metolazone without my recommendation

## 2014-02-03 ENCOUNTER — Other Ambulatory Visit: Payer: Self-pay | Admitting: Family Medicine

## 2014-03-02 ENCOUNTER — Encounter: Payer: Self-pay | Admitting: Physician Assistant

## 2014-03-02 ENCOUNTER — Ambulatory Visit (INDEPENDENT_AMBULATORY_CARE_PROVIDER_SITE_OTHER): Payer: Medicare Other | Admitting: Physician Assistant

## 2014-03-02 VITALS — BP 140/78 | HR 84 | Temp 97.5°F | Resp 18 | Ht 67.0 in | Wt 221.0 lb

## 2014-03-02 DIAGNOSIS — R109 Unspecified abdominal pain: Secondary | ICD-10-CM

## 2014-03-02 NOTE — Progress Notes (Signed)
Patient ID: [name] MRN: [id number], DOB: August 20, 1928, 80 y.o. Date of Encounter: 03/02/2014, 2:38 PM    Chief Complaint:  Chief Complaint  Patient presents with  . Stomach problems     HPI: 80 y.o. year old male says that he had some discomfort across his abdomen yesterday so he called. He says that he drank some milk and the discomfort resolved. Says that the discomfort was present for probably about 45 minutes before it resolved. Says that he has had absolutely no abdominal discomfort since then but because he had scheduled this appointment, he came in !!! Reports he has had no change in his bowel habits. Reports he has had no other abdominal discomfort at all except for that one episode yesterday.     Home Meds:   Outpatient Prescriptions Prior to Visit  Medication Sig Dispense Refill  . diclofenac sodium (VOLTAREN) 1 % GEL Apply 4 g topically 3 (three) times daily as needed (pain).       . fenofibrate (TRICOR) 145 MG tablet Take 145 mg by mouth daily.      . finasteride (PROSCAR) 5 MG tablet Take 5 mg by mouth daily.       . fish oil-omega-3 fatty acids 1000 MG capsule Take 2 g by mouth daily.       . fluticasone (FLONASE) 50 MCG/ACT nasal spray Place 2 sprays into both nostrils daily as needed (congestion).       . furosemide (LASIX) 40 MG tablet 1 tab po qd - 2 tabs po qd prn leg swelling  45 tablet  0  . Menthol-Methyl Salicylate (MUSCLE RUB) 10-15 % CREA Apply 1 application topically daily as needed for muscle pain.       . metolazone (ZAROXOLYN) 5 MG tablet 1 tab po 1-2 times per week      . metoprolol (LOPRESSOR) 100 MG tablet Take 0.5 tablets (50 mg total) by mouth 2 (two) times daily.  30 tablet  5  . potassium chloride SA (K-DUR,KLOR-CON) 20 MEQ tablet Take 1 tablet (20 mEq total) by mouth daily.  30 tablet  3  . traMADol (ULTRAM) 50 MG tablet Take 50 mg by mouth daily as needed (pain).       . vitamin B-12 (CYANOCOBALAMIN) 1000 MCG tablet Take 1,000 mcg by  mouth daily.      . furosemide (LASIX) 40 MG tablet Take 40 mg by mouth See admin instructions. Take 2 tablets (80 mg) on Monday, take 1 tablet (40 mg) daily on all other days       No facility-administered medications prior to visit.    Allergies: No Known Allergies    Review of Systems: See HPI for pertinent ROS. All other ROS negative.    Physical Exam: Blood pressure 140/78, pulse 84, temperature 97.5 F (36.4 C), temperature source Oral, resp. rate 18, height 5\' 7"  (1.702 m), weight 221 lb (100.245 kg)., Body mass index is 34.61 kg/(m^2). General:  WNWD Male. Appears in no acute distress. Neck: Supple. No thyromegaly. No lymphadenopathy. Lungs: Clear bilaterally to auscultation without wheezes, rales, or rhonchi. Breathing is unlabored. Heart: Regular rhythm.  Abdomen: Soft, non-tender, non-distended with normoactive bowel sounds. No hepatomegaly. No rebound/guarding. No obvious abdominal masses. No areas of tenderness with palpation throughout. Msk:  Strength and tone normal for age. Extremities/Skin: Warm and dry.  Neuro: Alert and oriented X 3. Moves all extremities spontaneously. Gait is normal. CNII-XII grossly in tact. Psych:  Responds to questions appropriately with a  normal affect.     ASSESSMENT AND PLAN:  80 y.o. year old male with  1. Abdominal pain, unspecified site He had one episode of abdominal discomfort that lasted for about 45 minutes yesterday. It had no other episodes of discomfort and no episodes of discomfort since then. He is currently completely asymptomatic and exam is normal. It  probably was secondary to some gas pains. Told him to followup if he has recurrence of symptoms.   Marin Olp Devon, [place], [facility] 03/02/2014 2:38 PM

## 2014-03-10 ENCOUNTER — Telehealth: Payer: Self-pay | Admitting: Family Medicine

## 2014-03-10 ENCOUNTER — Other Ambulatory Visit: Payer: Self-pay | Admitting: Family Medicine

## 2014-03-10 NOTE — Telephone Encounter (Signed)
Refill appropriate and filled per protocol. 

## 2014-03-10 NOTE — Telephone Encounter (Signed)
Joy from [facility] called back to tell us that the pt's heart rate while she was there jumped up to 145 and he drank him some cold water and relaxed a bit and it come down to 98. Jst FYI

## 2014-03-10 NOTE — Telephone Encounter (Signed)
Joy from [facility] calling to give dr pickard or nurse some news about this patient please call her back at [phone]

## 2014-03-17 ENCOUNTER — Ambulatory Visit (INDEPENDENT_AMBULATORY_CARE_PROVIDER_SITE_OTHER): Payer: Medicare Other | Admitting: Family Medicine

## 2014-03-17 ENCOUNTER — Encounter: Payer: Self-pay | Admitting: Family Medicine

## 2014-03-17 VITALS — BP 130/68 | HR 80 | Temp 96.8°F | Resp 18 | Ht 67.0 in | Wt 216.0 lb

## 2014-03-17 DIAGNOSIS — R109 Unspecified abdominal pain: Secondary | ICD-10-CM

## 2014-03-17 LAB — LIPASE: LIPASE: 36 U/L (ref 0–75)

## 2014-03-17 LAB — COMPLETE METABOLIC PANEL WITH GFR
ALK PHOS: 66 U/L (ref 39–117)
ALT: 12 U/L (ref 0–53)
AST: 26 U/L (ref 0–37)
Albumin: 3.8 g/dL (ref 3.5–5.2)
BILIRUBIN TOTAL: 0.6 mg/dL (ref 0.2–1.2)
BUN: 17 mg/dL (ref 6–23)
CO2: 32 meq/L (ref 19–32)
CREATININE: 1.04 mg/dL (ref 0.50–1.35)
Calcium: 9.7 mg/dL (ref 8.4–10.5)
Chloride: 98 mEq/L (ref 96–112)
GFR, EST AFRICAN AMERICAN: 77 mL/min
GFR, Est Non African American: 67 mL/min
Glucose, Bld: 174 mg/dL — ABNORMAL HIGH (ref 70–99)
Potassium: 3.1 mEq/L — ABNORMAL LOW (ref 3.5–5.3)
Sodium: 139 mEq/L (ref 135–145)
Total Protein: 7 g/dL (ref 6.0–8.3)

## 2014-03-17 LAB — CBC WITH DIFFERENTIAL/PLATELET
Basophils Absolute: 0.1 10*3/uL (ref 0.0–0.1)
Basophils Relative: 1 % (ref 0–1)
Eosinophils Absolute: 0.5 10*3/uL (ref 0.0–0.7)
Eosinophils Relative: 6 % — ABNORMAL HIGH (ref 0–5)
HEMATOCRIT: 44.8 % (ref 39.0–52.0)
Hemoglobin: 15.3 g/dL (ref 13.0–17.0)
Lymphocytes Relative: 38 % (ref 12–46)
Lymphs Abs: 2.9 10*3/uL (ref 0.7–4.0)
MCH: 31 pg (ref 26.0–34.0)
MCHC: 34.2 g/dL (ref 30.0–36.0)
MCV: 90.7 fL (ref 78.0–100.0)
MONO ABS: 0.5 10*3/uL (ref 0.1–1.0)
MONOS PCT: 6 % (ref 3–12)
NEUTROS ABS: 3.7 10*3/uL (ref 1.7–7.7)
NEUTROS PCT: 49 % (ref 43–77)
Platelets: 140 10*3/uL — ABNORMAL LOW (ref 150–400)
RBC: 4.94 MIL/uL (ref 4.22–5.81)
RDW: 14.2 % (ref 11.5–15.5)
WBC: 7.5 10*3/uL (ref 4.0–10.5)

## 2014-03-17 NOTE — Progress Notes (Signed)
Subjective:    Patient ID: [name], male    DOB: 25-Jun-1929, 80 y.o.   MRN: [id number]  HPI  Patient has had episodes of abdominal discomfort. Both are located in the epigastric area. 2 days he was unable to eat anything. He has lost 5 pounds since his last office visit in July. However over the last week he is starting to feel better. He denies any indigestion. He denies any melena. He denies any hematochezia. He denies any nausea or vomiting or diarrhea. He denies any constipation. He denies any fever. Past Medical History  Diagnosis Date  . Hypertension   . OA (osteoarthritis)     rt leg  . ED (erectile dysfunction)   . Chronic fatigue   . Gout   . Bowing of leg   . Spinal stenosis   . Prostate cancer   . CHF (congestive heart failure)    Current Outpatient Prescriptions on File Prior to Visit  Medication Sig Dispense Refill  . diclofenac sodium (VOLTAREN) 1 % GEL Apply 4 g topically 3 (three) times daily as needed (pain).       . fenofibrate (TRICOR) 145 MG tablet Take 145 mg by mouth daily.      . finasteride (PROSCAR) 5 MG tablet Take 5 mg by mouth daily.       . fish oil-omega-3 fatty acids 1000 MG capsule Take 2 g by mouth daily.       . fluticasone (FLONASE) 50 MCG/ACT nasal spray Place 2 sprays into both nostrils daily as needed (congestion).       . furosemide (LASIX) 40 MG tablet TAKE ONE TO TWO TABLETS ONCE DAILY AS NEEDED FOR LEG SWELLING  45 tablet  0  . Menthol-Methyl Salicylate (MUSCLE RUB) 10-15 % CREA Apply 1 application topically daily as needed for muscle pain.       . metoprolol (LOPRESSOR) 100 MG tablet Take 0.5 tablets (50 mg total) by mouth 2 (two) times daily.  30 tablet  5  . potassium chloride SA (K-DUR,KLOR-CON) 20 MEQ tablet Take 1 tablet (20 mEq total) by mouth daily.  30 tablet  3  . traMADol (ULTRAM) 50 MG tablet Take 50 mg by mouth daily as needed (pain).       . vitamin B-12 (CYANOCOBALAMIN) 1000 MCG tablet Take 1,000 mcg by mouth daily.        No current facility-administered medications on file prior to visit.   No Known Allergies Past Surgical History  Procedure Laterality Date  . Back surgery x 2    . Rt rotator cuff repair    . Cervical spine surgery    . Back surgery     History   Social History  . Marital Status: Divorced    Spouse Name: N/A    Number of Children: 59  . Years of Education: N/A   Occupational History  .     Social History Main Topics  . Smoking status: Former Research scientist (life sciences)  . Smokeless tobacco: Never Used     Comment: QUIT SMOKING  MANY YEARS AGO"  . Alcohol Use: No  . Drug Use: No  . Sexual Activity: Not on file   Other Topics Concern  . Not on file   Social History Narrative   Four adopted children.  Lives alone.      Review of Systems  All other systems reviewed and are negative.      Objective:   Physical Exam  Vitals reviewed. Constitutional: He appears  well-developed and well-nourished.  Cardiovascular: Normal rate, regular rhythm, normal heart sounds and intact distal pulses.  Exam reveals no gallop and no friction rub.   No murmur heard. Pulmonary/Chest: Effort normal and breath sounds normal. No respiratory distress. He has no wheezes. He has no rales. He exhibits no tenderness.  Abdominal: Soft. Bowel sounds are normal. He exhibits no distension and no mass. There is no tenderness. There is no rebound and no guarding.  Musculoskeletal: He exhibits no edema.          Assessment & Plan:  1. Abdominal pain, unspecified site Patient symptoms could be explained by indigestion. At present time he's been symptom-free for one week. If symptoms return, I would like to evaluate the patient for gastroesophageal reflux disease and possible ulcers.  If patient remains symptom free, no further workup is necessary as long as he does not continue to lose weight. Begin with a basic workup including a CBC, CMP, and lipase.   - COMPLETE METABOLIC PANEL WITH GFR - CBC with  Differential - Lipase

## 2014-03-18 ENCOUNTER — Other Ambulatory Visit: Payer: Self-pay | Admitting: *Deleted

## 2014-03-18 DIAGNOSIS — E876 Hypokalemia: Secondary | ICD-10-CM

## 2014-03-28 ENCOUNTER — Other Ambulatory Visit: Payer: Self-pay | Admitting: Family Medicine

## 2014-03-28 ENCOUNTER — Other Ambulatory Visit: Payer: Medicare Other

## 2014-03-28 DIAGNOSIS — E876 Hypokalemia: Secondary | ICD-10-CM

## 2014-03-28 LAB — BASIC METABOLIC PANEL
BUN: 14 mg/dL (ref 6–23)
CHLORIDE: 98 meq/L (ref 96–112)
CO2: 28 mEq/L (ref 19–32)
Calcium: 9.2 mg/dL (ref 8.4–10.5)
Creat: 1.03 mg/dL (ref 0.50–1.35)
Glucose, Bld: 132 mg/dL — ABNORMAL HIGH (ref 70–99)
Potassium: 3.3 mEq/L — ABNORMAL LOW (ref 3.5–5.3)
Sodium: 137 mEq/L (ref 135–145)

## 2014-03-28 NOTE — Telephone Encounter (Signed)
Medication called to pharmacy. 

## 2014-03-28 NOTE — Telephone Encounter (Signed)
Ok to refill??  Last office visit 03/17/2014.  Last refill 12/14/2013.

## 2014-03-28 NOTE — Telephone Encounter (Signed)
ok 

## 2014-03-28 NOTE — Telephone Encounter (Signed)
Refill appropriate and filled per protocol. 

## 2014-03-30 ENCOUNTER — Other Ambulatory Visit: Payer: Self-pay | Admitting: *Deleted

## 2014-03-30 MED ORDER — POTASSIUM CHLORIDE CRYS ER 20 MEQ PO TBCR: EXTENDED_RELEASE_TABLET | ORAL | Status: DC

## 2014-04-07 ENCOUNTER — Other Ambulatory Visit: Payer: Self-pay | Admitting: Physician Assistant

## 2014-04-07 NOTE — Telephone Encounter (Signed)
Refill appropriate and filled per protocol. 

## 2014-04-13 ENCOUNTER — Other Ambulatory Visit: Payer: Self-pay | Admitting: Family Medicine

## 2014-04-14 ENCOUNTER — Other Ambulatory Visit: Payer: Self-pay | Admitting: Family Medicine

## 2014-04-14 NOTE — Telephone Encounter (Signed)
?   Ok to refill, Tammy from [facility] called stating that pt is there wanting refill, I went ahead and approved it just need the ok.

## 2014-04-14 NOTE — Telephone Encounter (Signed)
ok 

## 2014-04-26 ENCOUNTER — Ambulatory Visit (INDEPENDENT_AMBULATORY_CARE_PROVIDER_SITE_OTHER): Payer: Medicare Other | Admitting: Family Medicine

## 2014-04-26 ENCOUNTER — Encounter: Payer: Self-pay | Admitting: Family Medicine

## 2014-04-26 VITALS — BP 142/86 | HR 68 | Temp 97.3°F | Resp 18 | Ht 66.0 in | Wt 224.0 lb

## 2014-04-26 DIAGNOSIS — Z23 Encounter for immunization: Secondary | ICD-10-CM

## 2014-04-26 DIAGNOSIS — Z0000 Encounter for general adult medical examination without abnormal findings: Secondary | ICD-10-CM

## 2014-04-26 NOTE — Progress Notes (Signed)
Subjective:    Patient ID: [name], male    DOB: 1928/09/05, 80 y.o.   MRN: [id number]  HPI Subjective:   Patient presents for Medicare Annual/Subsequent preventive examination.   He has no specific complaints. He is here only for the physical.   Patient is scheduled to be remarried next week. He has 4 grown children who live in [place]. He is planning a honeymoon to [place].  He is due for a flu shot as well as Prevnar 13. Have also recommended Zostavax. The patient is unaware of the last time he has had a colonoscopy. He also has a past medical history of prostate cancer status post prostatectomy. He is overdue for PSA. I reviewed his most recent CBC and CMP which were acceptable. He is overdue for fasting lipid panel.  He denies any recent episodes of SVT. He had one mild episode when she was able to stop spontaneously using vagal maneuvers.  Review Past Medical/Family/Social: Past Medical History  Diagnosis Date  . Hypertension   . OA (osteoarthritis)     rt leg  . ED (erectile dysfunction)   . Chronic fatigue   . Gout   . Bowing of leg   . Spinal stenosis   . Prostate cancer   . CHF (congestive heart failure)    Past Surgical History  Procedure Laterality Date  . Back surgery x 2    . Rt rotator cuff repair    . Cervical spine surgery    . Back surgery     Current Outpatient Prescriptions on File Prior to Visit  Medication Sig Dispense Refill  . diclofenac sodium (VOLTAREN) 1 % GEL Apply 4 g topically 3 (three) times daily as needed (pain).       . fenofibrate (TRICOR) 145 MG tablet Take 145 mg by mouth daily.      . finasteride (PROSCAR) 5 MG tablet Take 5 mg by mouth daily.       . fish oil-omega-3 fatty acids 1000 MG capsule Take 2 g by mouth daily.       . fluticasone (FLONASE) 50 MCG/ACT nasal spray USE TWO SPRAYS IN EACH NOSTRIL DAILY  16 g  1  . furosemide (LASIX) 40 MG tablet TAKE ONE TO TWO TABLETS ONCE DAILY AS NEEDED FOR LEG SWELLING  45 tablet   0  . Menthol-Methyl Salicylate (MUSCLE RUB) 10-15 % CREA Apply 1 application topically daily as needed for muscle pain.       . metolazone (ZAROXOLYN) 5 MG tablet TAKE ONE (1) TABLET when diected by physicia for edema      . metoprolol (LOPRESSOR) 100 MG tablet Take 0.5 tablets (50 mg total) by mouth 2 (two) times daily.  30 tablet  5  . potassium chloride SA (K-DUR,KLOR-CON) 20 MEQ tablet TAKE TWO (2) TABLETS BY MOUTH EVERY DAY  60 tablet  3  . traMADol (ULTRAM) 50 MG tablet TAKE ONE TABLET EVERY 6 HOURS AS NEEDED FOR PAIN.  40 tablet  0  . vitamin B-12 (CYANOCOBALAMIN) 1000 MCG tablet Take 1,000 mcg by mouth daily.       No current facility-administered medications on file prior to visit.   No Known Allergies History   Social History  . Marital Status: Divorced    Spouse Name: N/A    Number of Children: 35  . Years of Education: N/A   Occupational History  .     Social History Main Topics  . Smoking status: Former Research scientist (life sciences)  .  Smokeless tobacco: Never Used     Comment: QUIT SMOKING  MANY YEARS AGO"  . Alcohol Use: No  . Drug Use: No  . Sexual Activity: Not on file   Other Topics Concern  . Not on file   Social History Narrative   Four adopted children.  Lives alone.    Family History  Problem Relation Age of Onset  . Heart failure Mother 62    Depression Screen  (Note: if answer to either of the following is "Yes", a more complete depression screening is indicated)  Over the past two weeks, have you felt down, depressed or hopeless? No Over the past two weeks, have you felt little interest or pleasure in doing things? No Have you lost interest or pleasure in daily life? No Do you often feel hopeless? No Do you cry easily over simple problems? No   Activities of Daily Living  In your present state of health, do you have any difficulty performing the following activities?:  Driving? No  Managing money? No  Feeding yourself? No  Getting from bed to chair? No    Climbing a flight of stairs? No  Preparing food and eating?: No  Bathing or showering? No  Getting dressed: No  Getting to the toilet? No  Using the toilet:No  Moving around from place to place: No  In the past year have you fallen or had a near fall?:No  Are you sexually active? No  Do you have more than one partner? No   Hearing Difficulties: No  Do you often ask people to speak up or repeat themselves? No  Do you experience ringing or noises in your ears? No Do you have difficulty understanding soft or whispered voices? No  Do you feel that you have a problem with memory? No Do you often misplace items? No  Do you feel safe at home? Yes  Cognitive Testing  Alert? Yes Normal Appearance?Yes  Oriented to person? Yes Place? Yes  Time? Yes  Recall of three objects? Yes  Can perform simple calculations? Yes  Displays appropriate judgment?Yes  Can read the correct time from a watch face?Yes    Screening Tests / Date Colonoscopy    DUE                 Zostavax DUE Influenza Vaccine DUE Pneumovax 23-11/14 Review of Systems  All other systems reviewed and are negative.      Objective:   Physical Exam  Vitals reviewed. Constitutional: He is oriented to person, place, and time. He appears well-developed and well-nourished. No distress.  HENT:  Head: Normocephalic and atraumatic.  Right Ear: External ear normal.  Left Ear: External ear normal.  Nose: Nose normal.  Mouth/Throat: Oropharynx is clear and moist. No oropharyngeal exudate.  Eyes: Conjunctivae and EOM are normal. Pupils are equal, round, and reactive to light. Right eye exhibits no discharge. Left eye exhibits no discharge. No scleral icterus.  Neck: Normal range of motion. Neck supple. No JVD present. No tracheal deviation present. No thyromegaly present.  Cardiovascular: Normal rate, regular rhythm and intact distal pulses.  Exam reveals no gallop and no friction rub.   Murmur heard. Pulmonary/Chest: Effort  normal and breath sounds normal. No stridor. No respiratory distress. He has no wheezes. He has no rales. He exhibits no tenderness.  Abdominal: Soft. Bowel sounds are normal. He exhibits no distension and no mass. There is no tenderness. There is no rebound and no guarding.  Musculoskeletal: Normal range of  motion. He exhibits edema. He exhibits no tenderness.  Lymphadenopathy:    He has no cervical adenopathy.  Neurological: He is alert and oriented to person, place, and time. He has normal reflexes. He displays normal reflexes. No cranial nerve deficit. He exhibits normal muscle tone. Coordination normal.  Skin: Skin is warm. No rash noted. He is not diaphoretic. No erythema. No pallor.  Psychiatric: He has a normal mood and affect. His behavior is normal. Judgment and thought content normal.          Assessment & Plan:  Routine general medical examination at a health care facility - Plan: Lipid panel, PSA, Medicare patient's physical exam is within normal limits except for his weight. I recommended diet, gradually increasing aerobic exercise, to help address his obesity.  Patient received Prevnar 13 as well as his flu shot today. His immunizations are up-to-date. I also recommended the shingles vaccine. Given his age and his medical comorbidities, I recommended that he not pursue a colonoscopy at this time. I will have him return for a PSA to monitor for recurrence of his prostate cancer. Also have the patient return fasting for a fasting lipid panel to monitor his cholesterol. Medicare Attestation  I have personally reviewed:  The patient's medical and social history  Their use of alcohol, tobacco or illicit drugs  Their current medications and supplements  The patient's functional ability including ADLs,fall risks, home safety risks, cognitive, and hearing and visual impairment  Diet and physical activities  Evidence for depression or mood disorders  The patient's weight, height, BMI,  and visual acuity have been recorded in the chart. I have made referrals, counseling, and provided education to the patient based on review of the above and I have provided the patient with a written personalized care plan for preventive services.

## 2014-05-02 ENCOUNTER — Other Ambulatory Visit: Payer: Self-pay | Admitting: Family Medicine

## 2014-05-02 NOTE — Telephone Encounter (Signed)
Denied, this is to be used sparingly.

## 2014-05-02 NOTE — Telephone Encounter (Signed)
Ok to refill??  Last office visit 04/26/2014.  Last refill 03/10/2014.

## 2014-05-16 ENCOUNTER — Other Ambulatory Visit: Payer: Self-pay | Admitting: Family Medicine

## 2014-05-16 NOTE — Telephone Encounter (Signed)
ok 

## 2014-05-16 NOTE — Telephone Encounter (Signed)
Medication called to pharmacy. 

## 2014-05-16 NOTE — Telephone Encounter (Signed)
?   OK to Refill  

## 2014-05-19 ENCOUNTER — Other Ambulatory Visit: Payer: Medicare Other

## 2014-05-19 ENCOUNTER — Other Ambulatory Visit: Payer: Self-pay | Admitting: Family Medicine

## 2014-05-19 DIAGNOSIS — Z0000 Encounter for general adult medical examination without abnormal findings: Secondary | ICD-10-CM

## 2014-05-19 LAB — LIPID PANEL
CHOL/HDL RATIO: 3.8 ratio
Cholesterol: 124 mg/dL (ref 0–200)
HDL: 33 mg/dL — AB (ref >39)
LDL Cholesterol: 49 mg/dL (ref 0–99)
Triglycerides: 212 mg/dL — ABNORMAL HIGH (ref <150)
VLDL: 42 mg/dL — ABNORMAL HIGH (ref 0–40)

## 2014-05-19 MED ORDER — FINASTERIDE 5 MG PO TABS: 5.0000 mg | ORAL_TABLET | Freq: Every day | ORAL | Status: DC

## 2014-05-20 LAB — PSA, MEDICARE: PSA: 1.87 ng/mL

## 2014-06-19 ENCOUNTER — Encounter (HOSPITAL_COMMUNITY): Payer: Self-pay | Admitting: Emergency Medicine

## 2014-06-19 ENCOUNTER — Emergency Department (HOSPITAL_COMMUNITY)
Admission: EM | Admit: 2014-06-19 | Discharge: 2014-06-19 | Disposition: A | Discharge status: Home / Self Care | Payer: Medicare Other | Attending: Emergency Medicine | Admitting: Emergency Medicine

## 2014-06-19 ENCOUNTER — Emergency Department (HOSPITAL_COMMUNITY): Payer: Medicare Other

## 2014-06-19 DIAGNOSIS — Z8546 Personal history of malignant neoplasm of prostate: Secondary | ICD-10-CM | POA: Diagnosis not present

## 2014-06-19 DIAGNOSIS — M1990 Unspecified osteoarthritis, unspecified site: Secondary | ICD-10-CM | POA: Diagnosis not present

## 2014-06-19 DIAGNOSIS — Z7951 Long term (current) use of inhaled steroids: Secondary | ICD-10-CM | POA: Diagnosis not present

## 2014-06-19 DIAGNOSIS — I10 Essential (primary) hypertension: Secondary | ICD-10-CM | POA: Insufficient documentation

## 2014-06-19 DIAGNOSIS — I509 Heart failure, unspecified: Secondary | ICD-10-CM | POA: Diagnosis not present

## 2014-06-19 DIAGNOSIS — E860 Dehydration: Secondary | ICD-10-CM | POA: Diagnosis not present

## 2014-06-19 DIAGNOSIS — N289 Disorder of kidney and ureter, unspecified: Secondary | ICD-10-CM | POA: Diagnosis not present

## 2014-06-19 DIAGNOSIS — R42 Dizziness and giddiness: Secondary | ICD-10-CM | POA: Diagnosis present

## 2014-06-19 DIAGNOSIS — Z87891 Personal history of nicotine dependence: Secondary | ICD-10-CM | POA: Insufficient documentation

## 2014-06-19 DIAGNOSIS — R079 Chest pain, unspecified: Secondary | ICD-10-CM

## 2014-06-19 DIAGNOSIS — Z79899 Other long term (current) drug therapy: Secondary | ICD-10-CM | POA: Insufficient documentation

## 2014-06-19 LAB — COMPREHENSIVE METABOLIC PANEL
ALBUMIN: 3.5 g/dL (ref 3.5–5.2)
ALK PHOS: 71 U/L (ref 39–117)
ALT: 41 U/L (ref 0–53)
AST: 65 U/L — ABNORMAL HIGH (ref 0–37)
Anion gap: 17 — ABNORMAL HIGH (ref 5–15)
BUN: 25 mg/dL — ABNORMAL HIGH (ref 6–23)
CO2: 23 mEq/L (ref 19–32)
Calcium: 9.7 mg/dL (ref 8.4–10.5)
Chloride: 97 mEq/L (ref 96–112)
Creatinine, Ser: 2.22 mg/dL — ABNORMAL HIGH (ref 0.50–1.35)
GFR calc Af Amer: 30 mL/min — ABNORMAL LOW (ref >90)
GFR calc non Af Amer: 26 mL/min — ABNORMAL LOW (ref >90)
Glucose, Bld: 263 mg/dL — ABNORMAL HIGH (ref 70–99)
POTASSIUM: 5.2 meq/L (ref 3.7–5.3)
SODIUM: 137 meq/L (ref 137–147)
Total Bilirubin: 1.2 mg/dL (ref 0.3–1.2)
Total Protein: 7.2 g/dL (ref 6.0–8.3)

## 2014-06-19 LAB — CBC
HCT: 48 % (ref 39.0–52.0)
Hemoglobin: 15.8 g/dL (ref 13.0–17.0)
MCH: 31.9 pg (ref 26.0–34.0)
MCHC: 32.9 g/dL (ref 30.0–36.0)
MCV: 97 fL (ref 78.0–100.0)
PLATELETS: 105 10*3/uL — AB (ref 150–400)
RBC: 4.95 MIL/uL (ref 4.22–5.81)
RDW: 14.4 % (ref 11.5–15.5)
WBC: 8 10*3/uL (ref 4.0–10.5)

## 2014-06-19 LAB — I-STAT TROPONIN, ED: Troponin i, poc: 0.17 ng/mL (ref 0.00–0.08)

## 2014-06-19 LAB — TROPONIN I

## 2014-06-19 MED ORDER — SODIUM CHLORIDE 0.9 % IV BOLUS (SEPSIS)
1000.0000 mL | Freq: Once | INTRAVENOUS | Status: AC
  Administered 2014-06-19: 1000 mL via INTRAVENOUS

## 2014-06-19 MED ORDER — ONDANSETRON 8 MG PO TBDP: 8.0000 mg | ORAL_TABLET | Freq: Three times a day (TID) | ORAL | Status: DC | PRN

## 2014-06-19 MED ORDER — ONDANSETRON HCL 4 MG/2ML IJ SOLN
4.0000 mg | Freq: Once | INTRAMUSCULAR | Status: AC
  Administered 2014-06-19: 4 mg via INTRAVENOUS
  Filled 2014-06-19: qty 2

## 2014-06-19 NOTE — ED Notes (Signed)
Phlebotomy at bedside.

## 2014-06-19 NOTE — ED Notes (Signed)
Family at bedside. 

## 2014-06-19 NOTE — ED Notes (Signed)
Pt reports waking up today with nausea that progressed to abdominal pain and chest pain- pt states he usually puts his head into a bowl of ice water and this relieves his symptoms.  Pt states that around 11 his neck started to hurt and he was then able to vomit.  After vomiting pt states "I feel drunk"- admits to dizziness and unable to walk by himself.  Denies changes in vision, neuro exam negative.

## 2014-06-19 NOTE — ED Notes (Signed)
Pt with elevated I-stat trop of 0.17 in waiting area.  Dr. Venora Maples and Royce Macadamia at [facility] were notified.

## 2014-06-19 NOTE — ED Provider Notes (Signed)
CSN: [id number]     Arrival date & time 06/19/14  1641 History   [facility] MD Initiated Contact with Patient 06/19/14 1819     Chief Complaint  Patient presents with  . Dizziness      HPI Patient presents to the emergency department complaining of nausea through most of day.  He had no vomiting.  He does report some diarrhea.  He reports no abdominal pain.  He denies chest pain, neck pain, jaw pain.  No shortness breath.  No fevers or chills.  No recent sick contact.  He does admit to decreased oral intake today.  He states he was nauseated and felt lightheaded and "drunk".  Now that he is in the emergency department he is feeling much better at this time.  He can stand beside the bed without any unsteadiness.  He still has slight nausea at this time.  Family reports no altered mental status.   Past Medical History  Diagnosis Date  . Hypertension   . OA (osteoarthritis)     rt leg  . ED (erectile dysfunction)   . Chronic fatigue   . Gout   . Bowing of leg   . Spinal stenosis   . Prostate cancer   . CHF (congestive heart failure)    Past Surgical History  Procedure Laterality Date  . Back surgery x 2    . Rt rotator cuff repair    . Cervical spine surgery    . Back surgery     Family History  Problem Relation Age of Onset  . Heart failure Mother 88   History  Substance Use Topics  . Smoking status: Former Research scientist (life sciences)  . Smokeless tobacco: Never Used     Comment: QUIT SMOKING  MANY YEARS AGO"  . Alcohol Use: No    Review of Systems  All other systems reviewed and are negative.     Allergies  Review of patient's allergies indicates no known allergies.  Home Medications   Prior to Admission medications   Medication Sig Start Date End Date Taking? Authorizing Provider  diclofenac sodium (VOLTAREN) 1 % GEL Apply 4 g topically 3 (three) times daily as needed (pain).    Yes Historical Provider, MD  fenofibrate (TRICOR) 145 MG tablet Take 145 mg by mouth daily.   Yes  Historical Provider, MD  finasteride (PROSCAR) 5 MG tablet Take 1 tablet (5 mg total) by mouth daily. 05/19/14  Yes [name], MD  fish oil-omega-3 fatty acids 1000 MG capsule Take 2 g by mouth daily.    Yes Historical Provider, MD  fluticasone (FLONASE) 50 MCG/ACT nasal spray USE TWO SPRAYS IN EACH NOSTRIL DAILY 04/14/14  Yes [name], MD  furosemide (LASIX) 40 MG tablet TAKE ONE TO TWO TABLETS ONCE DAILY AS NEEDED FOR LEG SWELLING 04/14/14  Yes [name], MD  Menthol-Methyl Salicylate (MUSCLE RUB) 10-15 % CREA Apply 1 application topically daily as needed for muscle pain.    Yes Historical Provider, MD  metolazone (ZAROXOLYN) 5 MG tablet TAKE ONE (1) TABLET when diected by physicia for edema 03/10/14   Orlena Sheldon, PA-C  metolazone (ZAROXOLYN) 5 MG tablet TAKE ONE TABLET ONCE DAILY 05/19/14   [name], MD  metoprolol (LOPRESSOR) 100 MG tablet Take 0.5 tablets (50 mg total) by mouth 2 (two) times daily. 08/31/13   [name], MD  potassium chloride SA (K-DUR,KLOR-CON) 20 MEQ tablet TAKE TWO (2) TABLETS BY MOUTH EVERY DAY 03/30/14   Cletus Gash  Avel Peace, MD  traMADol (ULTRAM) 50 MG tablet TAKE ONE TABLET EVERY SIX HOURS AS NEEDED FOR PAIN 05/16/14   [name], MD  vitamin B-12 (CYANOCOBALAMIN) 1000 MCG tablet Take 1,000 mcg by mouth daily.    Historical Provider, MD   BP 115/80 mmHg  Pulse 64  Temp(Src) 97.6 F (36.4 C) (Oral)  Resp 14  SpO2 95% Physical Exam  Constitutional: He is oriented to person, place, and time. He appears well-developed and well-nourished.  HENT:  Head: Normocephalic and atraumatic.  Eyes: EOM are normal.  Neck: Normal range of motion.  Cardiovascular: Normal rate, regular rhythm, normal heart sounds and intact distal pulses.   Pulmonary/Chest: Effort normal and breath sounds normal. No respiratory distress.  Abdominal: Soft. He exhibits no distension. There is no tenderness.  Musculoskeletal: Normal range of motion.   Neurological: He is alert and oriented to person, place, and time.  Skin: Skin is warm and dry.  Psychiatric: He has a normal mood and affect. Judgment normal.  Nursing note and vitals reviewed.   ED Course  Procedures (including critical care time) Labs Review Labs Reviewed  CBC - Abnormal; Notable for the following:    Platelets 105 (*)    All other components within normal limits  COMPREHENSIVE METABOLIC PANEL - Abnormal; Notable for the following:    Glucose, Bld 263 (*)    BUN 25 (*)    Creatinine, Ser 2.22 (*)    AST 65 (*)    GFR calc non Af Amer 26 (*)    GFR calc Af Amer 30 (*)    Anion gap 17 (*)    All other components within normal limits  I-STAT TROPOININ, ED - Abnormal; Notable for the following:    Troponin i, poc 0.17 (*)    All other components within normal limits  TROPONIN I    Imaging Review Dg Chest 2 View  06/19/2014   CLINICAL DATA:  Nausea and chest pain  EXAM: CHEST  2 VIEW  COMPARISON:  06/29/2013  FINDINGS: Cardiac shadow remains enlarged. The lungs are well aerated bilaterally. No focal infiltrate or sizable effusion is seen. No acute bony abnormality is noted.  IMPRESSION: No active cardiopulmonary disease.   Electronically Signed   By: Inez Catalina M.D.   On: 06/19/2014 17:46  I personally reviewed the imaging tests through PACS system I reviewed available ER/hospitalization records through the EMR    EKG Interpretation   Date/Time:  Sunday June 19 2014 16:48:54 EST Ventricular Rate:  63 PR Interval:  178 QRS Duration: 108 QT Interval:  472 QTC Calculation: 483 R Axis:   -32 Text Interpretation:  Normal sinus rhythm Possible Left atrial enlargement  Left axis deviation Possible Anteroseptal infarct , age undetermined  Abnormal ECG No significant change was found Confirmed by Wilkins Elpers  MD,  Lennette Bihari ([id number]) on 06/19/2014 9:15:12 PM      MDM   Final diagnoses:  Chest pain    Patient feels much better at this time.  He would like  to go home.  Initially his point care troponin was mildly elevated but his EKG was without significant changes.  His regular lab troponin is normal.  I suspect lab error in our point-of-care testing area.  Low suspicion for ACS.  Discharge home in good condition.patient's renal function is worse than baseline.  Creatinine today is 2.2.  He understands to follow-up with his doctor in the next 24-48 hours for recheck of his creatinine.  I sent a  note to his primary care physician through the electronic medical record to alert him of my concerns and my thought that he would benefit from repeat creatinine testing in the next 48 hours    Hoy Morn, MD 06/19/14 2129

## 2014-06-19 NOTE — Discharge Instructions (Signed)
Dehydration, Adult Dehydration is when you lose more fluids from the body than you take in. Vital organs like the kidneys, brain, and heart cannot function without a proper amount of fluids and salt. Any loss of fluids from the body can cause dehydration.  CAUSES   Vomiting.  Diarrhea.  Excessive sweating.  Excessive urine output.  Fever. SYMPTOMS  Mild dehydration  Thirst.  Dry lips.  Slightly dry mouth. Moderate dehydration  Very dry mouth.  Sunken eyes.  Skin does not bounce back quickly when lightly pinched and released.  Dark urine and decreased urine production.  Decreased tear production.  Headache. Severe dehydration  Very dry mouth.  Extreme thirst.  Rapid, weak pulse (more than 100 beats per minute at rest).  Cold hands and feet.  Not able to sweat in spite of heat and temperature.  Rapid breathing.  Blue lips.  Confusion and lethargy.  Difficulty being awakened.  Minimal urine production.  No tears. DIAGNOSIS  Your caregiver will diagnose dehydration based on your symptoms and your exam. Blood and urine tests will help confirm the diagnosis. The diagnostic evaluation should also identify the cause of dehydration. TREATMENT  Treatment of mild or moderate dehydration can often be done at home by increasing the amount of fluids that you drink. It is best to drink small amounts of fluid more often. Drinking too much at one time can make vomiting worse. Refer to the home care instructions below. Severe dehydration needs to be treated at the hospital where you will probably be given intravenous (IV) fluids that contain water and electrolytes. HOME CARE INSTRUCTIONS   Ask your caregiver about specific rehydration instructions.  Drink enough fluids to keep your urine clear or pale yellow.  Drink small amounts frequently if you have nausea and vomiting.  Eat as you normally do.  Avoid:  Foods or drinks high in sugar.  Carbonated  drinks.  Juice.  Extremely hot or cold fluids.  Drinks with caffeine.  Fatty, greasy foods.  Alcohol.  Tobacco.  Overeating.  Gelatin desserts.  Wash your hands well to avoid spreading bacteria and viruses.  Only take over-the-counter or prescription medicines for pain, discomfort, or fever as directed by your caregiver.  Ask your caregiver if you should continue all prescribed and over-the-counter medicines.  Keep all follow-up appointments with your caregiver. SEEK MEDICAL CARE IF:  You have abdominal pain and it increases or stays in one area (localizes).  You have a rash, stiff neck, or severe headache.  You are irritable, sleepy, or difficult to awaken.  You are weak, dizzy, or extremely thirsty. SEEK IMMEDIATE MEDICAL CARE IF:   You are unable to keep fluids down or you get worse despite treatment.  You have frequent episodes of vomiting or diarrhea.  You have blood or green matter (bile) in your vomit.  You have blood in your stool or your stool looks black and tarry.  You have not urinated in 6 to 8 hours, or you have only urinated a small amount of very dark urine.  You have a fever.  You faint. MAKE SURE YOU:   Understand these instructions.  Will watch your condition.  Will get help right away if you are not doing well or get worse. Document Released: 07/22/2005 Document Revised: 10/14/2011 Document Reviewed: 03/11/2011 Exit[employer] Patient Information 2015 [employer]. This information is not intended to replace advice given to you by your health care provider. Make sure you discuss any questions you have with your health care   provider.  

## 2014-06-19 NOTE — ED Notes (Signed)
Ambulated patient in the hall. Patient tolerated walk very with O2 saturation at 100% and  HR  between 79-82 BPM.

## 2014-06-19 NOTE — ED Notes (Signed)
MD at bedside. 

## 2014-06-20 ENCOUNTER — Other Ambulatory Visit: Payer: Self-pay | Admitting: Family Medicine

## 2014-06-20 NOTE — Telephone Encounter (Signed)
Ok to refill??  Last office visit 04/26/2014.  Last refill 05/16/2014.

## 2014-06-20 NOTE — Telephone Encounter (Signed)
Medication called to pharmacy. 

## 2014-06-20 NOTE — Telephone Encounter (Signed)
Okay to refill? 

## 2014-06-22 ENCOUNTER — Ambulatory Visit (INDEPENDENT_AMBULATORY_CARE_PROVIDER_SITE_OTHER): Payer: Medicare Other | Admitting: Cardiology

## 2014-06-22 ENCOUNTER — Encounter: Payer: Self-pay | Admitting: Cardiology

## 2014-06-22 ENCOUNTER — Encounter: Payer: Self-pay | Admitting: Family Medicine

## 2014-06-22 ENCOUNTER — Ambulatory Visit (INDEPENDENT_AMBULATORY_CARE_PROVIDER_SITE_OTHER): Payer: Medicare Other | Admitting: Family Medicine

## 2014-06-22 VITALS — BP 130/68 | HR 59 | Ht 70.0 in | Wt 234.0 lb

## 2014-06-22 VITALS — BP 122/68 | HR 58 | Temp 97.3°F | Resp 12 | Wt 236.0 lb

## 2014-06-22 DIAGNOSIS — R42 Dizziness and giddiness: Secondary | ICD-10-CM

## 2014-06-22 DIAGNOSIS — I272 Pulmonary hypertension, unspecified: Secondary | ICD-10-CM

## 2014-06-22 DIAGNOSIS — I5032 Chronic diastolic (congestive) heart failure: Secondary | ICD-10-CM

## 2014-06-22 DIAGNOSIS — I270 Primary pulmonary hypertension: Secondary | ICD-10-CM

## 2014-06-22 DIAGNOSIS — N179 Acute kidney failure, unspecified: Secondary | ICD-10-CM

## 2014-06-22 DIAGNOSIS — I509 Heart failure, unspecified: Secondary | ICD-10-CM

## 2014-06-22 DIAGNOSIS — I10 Essential (primary) hypertension: Secondary | ICD-10-CM

## 2014-06-22 NOTE — Patient Instructions (Signed)
We will call with kidney lab results Continue current medications Heart doctor appointment this afternoon F/U 3 months Dr. Dennard Schaumann

## 2014-06-22 NOTE — Assessment & Plan Note (Signed)
Now resolved, no red flags on exam

## 2014-06-22 NOTE — Patient Instructions (Addendum)
Your physician recommends that you schedule a follow-up appointment in: 6 months with Dr. Percival Spanish  We are ordering an echo

## 2014-06-22 NOTE — Progress Notes (Signed)
HPI The patient for followup of dyspnea. He was hospitalized in November of last year. I had seen him prior to this for lower extremity swelling. He was found to be volume overloaded. Echocardiography demonstrated a well preserved ejection fraction.  There was some evidence of diastolic dysfunction. There was an echogenic mobile mass on the anterior leaflet of the mitral valve with mild regurgitation.  At the last visit he was supposed to get a repeat echocardiogram. However, for some reason this did not happen. There was some volume overload in the spring and there was a phone call into the clinic. However, we were unable to get followup phone calls through to him.  In May he was in the emergency room with a narrow complex tachycardia. I reviewed these records including the EKG. I do see that he failed to convert with adenosine but didn't convert with beta blockers. He was most recently in the emergency room on the 15th of this month with nausea.  He reports eating some pears that did not agree with him. He otherwise says he feels quite well. He says when he has his palpitations he can typically get rid of them. He simply didn't that day when he went to the emergency room because he was not at home where he would usually put his face in a bucket of ice water. However, he denies any chest pressure, neck or arm discomfort.  He denies any shortness of breath, PND or orthopnea. He has no weight gain or edema.   No Known Allergies  Current Outpatient Prescriptions  Medication Sig Dispense Refill  . diclofenac sodium (VOLTAREN) 1 % GEL Apply 4 g topically 3 (three) times daily as needed (pain).     . fenofibrate (TRICOR) 145 MG tablet Take 145 mg by mouth daily.    . finasteride (PROSCAR) 5 MG tablet Take 1 tablet (5 mg total) by mouth daily. 30 tablet 11  . fish oil-omega-3 fatty acids 1000 MG capsule Take 2 g by mouth daily.     . fluticasone (FLONASE) 50 MCG/ACT nasal spray USE TWO SPRAYS IN EACH  NOSTRIL DAILY 16 g 1  . furosemide (LASIX) 40 MG tablet TAKE ONE TO TWO TABLETS ONCE DAILY AS NEEDED FOR LEG SWELLING 45 tablet 0  . Menthol-Methyl Salicylate (MUSCLE RUB) 10-15 % CREA Apply 1 application topically daily as needed for muscle pain.     . metolazone (ZAROXOLYN) 5 MG tablet TAKE ONE TABLET ONCE DAILY 30 tablet 3  . metoprolol (LOPRESSOR) 100 MG tablet Take 0.5 tablets (50 mg total) by mouth 2 (two) times daily. 30 tablet 5  . ondansetron (ZOFRAN ODT) 8 MG disintegrating tablet Take 1 tablet (8 mg total) by mouth every 8 (eight) hours as needed for nausea or vomiting. 10 tablet 0  . potassium chloride SA (K-DUR,KLOR-CON) 20 MEQ tablet TAKE TWO (2) TABLETS BY MOUTH EVERY DAY 60 tablet 3  . tamsulosin (FLOMAX) 0.4 MG CAPS capsule     . traMADol (ULTRAM) 50 MG tablet TAKE ONE TABLET EVERY SIX HOURS AS NEEDED FOR PAIN 40 tablet 0  . vitamin B-12 (CYANOCOBALAMIN) 1000 MCG tablet Take 1,000 mcg by mouth daily.     No current facility-administered medications for this visit.    Past Medical History  Diagnosis Date  . Hypertension   . OA (osteoarthritis)     rt leg  . ED (erectile dysfunction)   . Chronic fatigue   . Gout   . Bowing of leg   .  Spinal stenosis   . Prostate cancer   . CHF (congestive heart failure)     Past Surgical History  Procedure Laterality Date  . Back surgery x 2    . Rt rotator cuff repair    . Cervical spine surgery    . Back surgery       ROS: some difficulty swallowing, urinary frequency, leg swelling. Otherwise as stated in the HPI and negative for all other systems.  PHYSICAL EXAM BP 130/68 mmHg  Pulse 59  Ht 5\' 10"  (1.778 m)  Wt 234 lb (106.142 kg)  BMI 33.58 kg/m2 GENERAL:  Well appearing HEENT:  Pupils equal round and reactive, fundi not visualized, oral mucosa unremarkable, dentures NECK:  No jugular venous distention, waveform within normal limits, carotid upstroke brisk and symmetric, no bruits, no thyromegaly LYMPHATICS:  No  cervical, inguinal adenopathy LUNGS:  Clear to auscultation bilaterally BACK:  No CVA tenderness CHEST:  Unremarkable HEART:  PMI not displaced or sustained,S1 and S2 within normal limits, no S3, no S4, no clicks, no rubs, 3/6 apical systolic murmur radiating to the axilla, and no diastolic murmurs ABD:  Flat, positive bowel sounds normal in frequency in pitch, no bruits, no rebound, no guarding, no midline pulsatile mass, no hepatomegaly, no splenomegaly EXT:  2 plus pulses upper but decreased dorsalis pedis and posterior tibialis bilateral, moderate bilateral lower extremity edema, no cyanosis no clubbing  EKG:  Sinus bradycardia, rate 59, axis within normal limits, intervals within normal limits, no acute ST-T wave changes.  06/22/2014  ASSESSMENT AND PLAN   PULMONARY HTN:  On his echo there was no TR this last time and no mention of pulmonary hypertension. I will evaluate this at the time of the echo.  HTN:  His blood pressure is well controlled. He will continue the meds as listed.    MITRAL MASS:  He was supposed to get an echocardiogram Doppler at the last visit but this did not happen. I will order a transthoracic echocardiogram and TEE as needed.  SVT:  He said his head this for 14 years. He was told it was only a 50% chance of getting rid of it with surgery. I discussed with him the possibility of catheter ablation. There is an EKG demonstrating narrow complex SVT. He will consider whether he wants EP referral. For now he will continue with vagal maneuvers.  EDEMA:  This is mild and he will continue with conservative therapyany

## 2014-06-22 NOTE — Progress Notes (Signed)
Patient ID: [name], male   DOB: 03/27/1929, 80 y.o.   MRN: [id number]   Subjective:    Patient ID: [name], male    DOB: 1928-12-10, 80 y.o.   MRN: [id number]  Patient presents for Hospital F/U she here for hospital follow-up. He was seen in the ER 3 days ago secondary to some emesis and weakness after eating potatoes. He denies any diarrhea. He said his intake was very poor over the weekend but he was still taking his diuretics as prescribed. His workup in the hospital revealed a slightly elevated troponin but his EKG was unchanged and he was not complaining of any chest pain. He does have an appointment with his cardiologist this afternoon. His labs were also remarkable for increased creatinine at 2.2 which is above his baseline his creatinine was 1.03 proximally 2 months ago at his physical examination. He states that now he is eating and drinking well he has no concerns today. Of note he was 40 minutes late for appt    Review Of Systems:  GEN- + fatigue, fever, weight loss,weakness, recent illness HEENT- denies eye drainage, change in vision, nasal discharge, CVS- denies chest pain, palpitations RESP- denies SOB, cough, wheeze ABD- denies N/V, change in stools, abd pain GU- denies dysuria, hematuria, dribbling, incontinence MSK- denies joint pain, muscle aches, injury Neuro- denies headache, dizziness, syncope, seizure activity       Objective:    BP 122/68 mmHg  Pulse 58  Temp(Src) 97.3 F (36.3 C) (Oral)  Resp 12  Wt 236 lb (107.049 kg) GEN- NAD, alert and oriented x3 HEENT- PERRL, EOMI, non injected sclera, pink conjunctiva, MMM, oropharynx clear, TM Clear bilat Neck- Supple, no JVD, CVS- RRR, 3/6 SEM RESP-CTAB, normal WOB, EXT- + pitting edema,  chronic venous stasis changes Pulses- Radial, DP- decreased bilat        Assessment & Plan:      Problem List Items Addressed This Visit    Dizziness and giddiness    Now resolved, no red flags on exam     CHF (congestive heart failure)    Chronic diastolic failure, dehydrated state thsi weekend, he has had some weight gain and pitting edema on legs He has been taking lasix 40mg  and zaroxyln 5mg  Recheck his labs, if back at his baseline no adjustment, he may even tolerate an extra dose of lasix to get the IV off of him No symptoms today      Other Visit Diagnoses    Acute renal failure, unspecified acute renal failure type    -  Primary    Acute on likley some chronic insuffiency due to age, I think this was 2/2 dehyratation and diureteics, now back at baseline, recheck Creatinine    Relevant Orders       BASIC METABOLIC PANEL WITH GFR       CBC with Differential       Note: This dictation was prepared with Dragon dictation along with smaller phrase technology. Any transcriptional errors that result from this process are unintentional.

## 2014-06-22 NOTE — Assessment & Plan Note (Signed)
Chronic diastolic failure, dehydrated state thsi weekend, he has had some weight gain and pitting edema on legs He has been taking lasix 40mg  and zaroxyln 5mg  Recheck his labs, if back at his baseline no adjustment, he may even tolerate an extra dose of lasix to get the IV off of him No symptoms today

## 2014-06-23 LAB — BASIC METABOLIC PANEL WITH GFR
BUN: 30 mg/dL — ABNORMAL HIGH (ref 6–23)
CO2: 27 mEq/L (ref 19–32)
CREATININE: 1.35 mg/dL (ref 0.50–1.35)
Calcium: 9.2 mg/dL (ref 8.4–10.5)
Chloride: 104 mEq/L (ref 96–112)
GFR, EST AFRICAN AMERICAN: 56 mL/min — AB
GFR, EST NON AFRICAN AMERICAN: 48 mL/min — AB
Glucose, Bld: 131 mg/dL — ABNORMAL HIGH (ref 70–99)
Potassium: 4.1 mEq/L (ref 3.5–5.3)
Sodium: 139 mEq/L (ref 135–145)

## 2014-06-23 LAB — CBC WITH DIFFERENTIAL/PLATELET
BASOS PCT: 1 % (ref 0–1)
Basophils Absolute: 0.1 10*3/uL (ref 0.0–0.1)
EOS ABS: 0.4 10*3/uL (ref 0.0–0.7)
Eosinophils Relative: 5 % (ref 0–5)
HEMATOCRIT: 42 % (ref 39.0–52.0)
Hemoglobin: 14.4 g/dL (ref 13.0–17.0)
Lymphocytes Relative: 33 % (ref 12–46)
Lymphs Abs: 2.5 10*3/uL (ref 0.7–4.0)
MCH: 31.2 pg (ref 26.0–34.0)
MCHC: 34.3 g/dL (ref 30.0–36.0)
MCV: 90.9 fL (ref 78.0–100.0)
MONOS PCT: 7 % (ref 3–12)
MPV: 11.9 fL (ref 9.4–12.4)
Monocytes Absolute: 0.5 10*3/uL (ref 0.1–1.0)
NEUTROS PCT: 54 % (ref 43–77)
Neutro Abs: 4.2 10*3/uL (ref 1.7–7.7)
PLATELETS: 125 10*3/uL — AB (ref 150–400)
RBC: 4.62 MIL/uL (ref 4.22–5.81)
RDW: 14.7 % (ref 11.5–15.5)
WBC: 7.7 10*3/uL (ref 4.0–10.5)

## 2014-06-27 ENCOUNTER — Ambulatory Visit (HOSPITAL_COMMUNITY): Payer: Medicare Other

## 2014-07-04 ENCOUNTER — Telehealth: Payer: Self-pay | Admitting: Family Medicine

## 2014-07-04 MED ORDER — SILDENAFIL CITRATE 100 MG PO TABS
50.0000 mg | ORAL_TABLET | Freq: Every day | ORAL | Status: DC | PRN
Start: 2014-07-04 — End: 2014-07-19

## 2014-07-04 NOTE — Telephone Encounter (Signed)
[phone]  PT is needing a refill Viagra (he states he needs it now)

## 2014-07-04 NOTE — Telephone Encounter (Signed)
Med sent to pharm 

## 2014-07-04 NOTE — Telephone Encounter (Signed)
[phone]  Pt is needing a refill on Viagra (he states he needs it now)

## 2014-07-06 ENCOUNTER — Other Ambulatory Visit: Payer: Self-pay | Admitting: Family Medicine

## 2014-07-14 ENCOUNTER — Telehealth: Payer: Self-pay | Admitting: Family Medicine

## 2014-07-14 NOTE — Telephone Encounter (Signed)
Call placed to patient. No answer. 

## 2014-07-14 NOTE — Telephone Encounter (Signed)
Patient is wanting to speak to someone about a medication the pt said it was personal and did not want to go into detail with me  [phone]

## 2014-07-15 NOTE — Telephone Encounter (Signed)
Returned call to patient.   Reports that he requires refill on Viagra.   Advised that refill was sent to pharmacy on 07/04/2014, #11 refills.   Call placed to pharmacy and was advised that prescription refills are available.   Advised patient to contact pharmacy for refill and if new prescription is required, pharmacy will contact MD.

## 2014-07-19 ENCOUNTER — Ambulatory Visit (INDEPENDENT_AMBULATORY_CARE_PROVIDER_SITE_OTHER): Payer: Medicare Other | Admitting: Family Medicine

## 2014-07-19 ENCOUNTER — Encounter: Payer: Self-pay | Admitting: Family Medicine

## 2014-07-19 ENCOUNTER — Telehealth: Payer: Self-pay | Admitting: Family Medicine

## 2014-07-19 VITALS — BP 138/84 | HR 64 | Temp 97.4°F | Resp 18 | Wt 236.0 lb

## 2014-07-19 DIAGNOSIS — M778 Other enthesopathies, not elsewhere classified: Secondary | ICD-10-CM

## 2014-07-19 MED ORDER — METOPROLOL TARTRATE 100 MG PO TABS: 50.0000 mg | ORAL_TABLET | Freq: Two times a day (BID) | ORAL | Status: DC

## 2014-07-19 MED ORDER — SILDENAFIL CITRATE 100 MG PO TABS: 50.0000 mg | ORAL_TABLET | Freq: Every day | ORAL | Status: DC | PRN

## 2014-07-19 NOTE — Telephone Encounter (Signed)
Pharmacy asked to change Viagra Rx to the generic Sildenafil.  Also ask for refill of his Lopressor.  Both approved per provider.

## 2014-07-19 NOTE — Progress Notes (Signed)
Subjective:    Patient ID: [name], male    DOB: 20-Jul-1929, 80 y.o.   MRN: [id number]  HPI  patient reports one week of pain in his left wrist. The pain is centered over the first MCP joint. It is worse with ulnar deviation of the wrist. He has a positive Finkelstein sign. He has no pain with flexion or extension o the wrist. There is no erythema or swelling. He denies any falls or injuries. There is no pain in the anatomic snuffbox. He recently has been using a chainsaw and branch Clippers which may have irritated his wrist. Past Medical History  Diagnosis Date  . Hypertension   . OA (osteoarthritis)     rt leg  . ED (erectile dysfunction)   . Chronic fatigue   . Gout   . Bowing of leg   . Spinal stenosis   . Prostate cancer   . CHF (congestive heart failure)    Past Surgical History  Procedure Laterality Date  . Back surgery x 2    . Rt rotator cuff repair    . Cervical spine surgery    . Back surgery     Current Outpatient Prescriptions on File Prior to Visit  Medication Sig Dispense Refill  . diclofenac sodium (VOLTAREN) 1 % GEL Apply 4 g topically 3 (three) times daily as needed (pain).     . fenofibrate (TRICOR) 145 MG tablet Take 145 mg by mouth daily.    . finasteride (PROSCAR) 5 MG tablet Take 1 tablet (5 mg total) by mouth daily. 30 tablet 11  . fish oil-omega-3 fatty acids 1000 MG capsule Take 2 g by mouth daily.     . fluticasone (FLONASE) 50 MCG/ACT nasal spray USE TWO SPRAYS IN EACH NOSTRIL DAILY 16 g 1  . furosemide (LASIX) 40 MG tablet TAKE ONE TO TWO TABLETS ONCE DAILY AS NEEDED FOR LEG SWELLING 60 tablet 5  . Menthol-Methyl Salicylate (MUSCLE RUB) 10-15 % CREA Apply 1 application topically daily as needed for muscle pain.     . metolazone (ZAROXOLYN) 5 MG tablet TAKE ONE TABLET ONCE DAILY 30 tablet 3  . metoprolol (LOPRESSOR) 100 MG tablet Take 0.5 tablets (50 mg total) by mouth 2 (two) times daily. 30 tablet 5  . ondansetron (ZOFRAN ODT) 8 MG  disintegrating tablet Take 1 tablet (8 mg total) by mouth every 8 (eight) hours as needed for nausea or vomiting. 10 tablet 0  . potassium chloride SA (K-DUR,KLOR-CON) 20 MEQ tablet TAKE TWO (2) TABLETS BY MOUTH EVERY DAY 60 tablet 3  . tamsulosin (FLOMAX) 0.4 MG CAPS capsule     . traMADol (ULTRAM) 50 MG tablet TAKE ONE TABLET EVERY SIX HOURS AS NEEDED FOR PAIN 40 tablet 0  . vitamin B-12 (CYANOCOBALAMIN) 1000 MCG tablet Take 1,000 mcg by mouth daily.     No current facility-administered medications on file prior to visit.   No Known Allergies History   Social History  . Marital Status: Married    Spouse Name: N/A    Number of Children: 4  . Years of Education: N/A   Occupational History  .     Social History Main Topics  . Smoking status: Former Research scientist (life sciences)  . Smokeless tobacco: Never Used     Comment: QUIT SMOKING  MANY YEARS AGO"  . Alcohol Use: No  . Drug Use: No  . Sexual Activity: Not on file   Other Topics Concern  . Not on file   Social  History Narrative   Four adopted children.  Lives alone.       Review of Systems  All other systems reviewed and are negative.      Objective:   Physical Exam  Cardiovascular: Normal rate and regular rhythm.   Pulmonary/Chest: Effort normal and breath sounds normal.  Musculoskeletal:       Left wrist: He exhibits tenderness. He exhibits normal range of motion, no bony tenderness, no swelling, no effusion, no crepitus and no deformity.  Vitals reviewed.         Assessment & Plan:  Tendonitis of wrist, left   I believe the patient has tendinitis in the left wrist due to overuse. I have recommended Voltaren gel 4 times a day for 1 week. I recommended rest ice and compression. Recheck in one week if no better. At that time I would proceed with x-rays and if the x-rays are normal possibly prednisone

## 2014-08-08 ENCOUNTER — Other Ambulatory Visit: Payer: Self-pay | Admitting: Family Medicine

## 2014-08-08 NOTE — Telephone Encounter (Signed)
Medication called to pharmacy. 

## 2014-08-08 NOTE — Telephone Encounter (Signed)
Ok to refill??  Last office visit 07/19/2014.  Last refill 06/20/2014.

## 2014-08-08 NOTE — Telephone Encounter (Signed)
ok 

## 2014-08-09 ENCOUNTER — Other Ambulatory Visit: Payer: Self-pay | Admitting: Family Medicine

## 2014-08-09 NOTE — Telephone Encounter (Signed)
Medication refilled per protocol. 

## 2014-08-10 DIAGNOSIS — H2513 Age-related nuclear cataract, bilateral: Secondary | ICD-10-CM | POA: Diagnosis not present

## 2014-09-05 ENCOUNTER — Ambulatory Visit: Payer: Self-pay | Admitting: Family Medicine

## 2014-09-06 ENCOUNTER — Encounter: Payer: Self-pay | Admitting: Family Medicine

## 2014-09-20 ENCOUNTER — Other Ambulatory Visit: Payer: Self-pay | Admitting: Family Medicine

## 2014-09-20 NOTE — Telephone Encounter (Signed)
ok 

## 2014-09-20 NOTE — Telephone Encounter (Signed)
?   OK to Refill  

## 2014-09-30 DIAGNOSIS — H2513 Age-related nuclear cataract, bilateral: Secondary | ICD-10-CM | POA: Diagnosis not present

## 2014-10-11 ENCOUNTER — Other Ambulatory Visit: Payer: Self-pay | Admitting: Family Medicine

## 2014-10-21 ENCOUNTER — Other Ambulatory Visit: Payer: Self-pay | Admitting: Family Medicine

## 2014-10-25 ENCOUNTER — Telehealth: Payer: Self-pay | Admitting: Family Medicine

## 2014-10-25 NOTE — Telephone Encounter (Signed)
PATIENT WANTING TO TALK TO YOU REGARDING HIS BILL  [phone]

## 2014-11-01 DIAGNOSIS — H2512 Age-related nuclear cataract, left eye: Secondary | ICD-10-CM | POA: Diagnosis not present

## 2014-11-01 NOTE — Telephone Encounter (Signed)
I called patient back in reference to this message. He is aware that he owe's for his No show appointment and he will pay it the next time he comes in.

## 2014-11-14 ENCOUNTER — Emergency Department (HOSPITAL_COMMUNITY): Payer: Medicare Other

## 2014-11-14 ENCOUNTER — Other Ambulatory Visit: Payer: Self-pay | Admitting: Family Medicine

## 2014-11-14 ENCOUNTER — Inpatient Hospital Stay (HOSPITAL_COMMUNITY)
Admission: EM | Admit: 2014-11-14 | Discharge: 2014-11-17 | DRG: 246 | Disposition: A | Discharge status: Home / Self Care | Payer: Medicare Other | Attending: Cardiovascular Disease | Admitting: Cardiovascular Disease

## 2014-11-14 ENCOUNTER — Encounter (HOSPITAL_COMMUNITY): Payer: Self-pay

## 2014-11-14 DIAGNOSIS — I340 Nonrheumatic mitral (valve) insufficiency: Secondary | ICD-10-CM | POA: Diagnosis not present

## 2014-11-14 DIAGNOSIS — I214 Non-ST elevation (NSTEMI) myocardial infarction: Principal | ICD-10-CM | POA: Diagnosis present

## 2014-11-14 DIAGNOSIS — I129 Hypertensive chronic kidney disease with stage 1 through stage 4 chronic kidney disease, or unspecified chronic kidney disease: Secondary | ICD-10-CM | POA: Diagnosis not present

## 2014-11-14 DIAGNOSIS — R0602 Shortness of breath: Secondary | ICD-10-CM | POA: Diagnosis present

## 2014-11-14 DIAGNOSIS — R609 Edema, unspecified: Secondary | ICD-10-CM | POA: Diagnosis not present

## 2014-11-14 DIAGNOSIS — Z8546 Personal history of malignant neoplasm of prostate: Secondary | ICD-10-CM

## 2014-11-14 DIAGNOSIS — I10 Essential (primary) hypertension: Secondary | ICD-10-CM | POA: Diagnosis present

## 2014-11-14 DIAGNOSIS — I272 Other secondary pulmonary hypertension: Secondary | ICD-10-CM | POA: Diagnosis not present

## 2014-11-14 DIAGNOSIS — R079 Chest pain, unspecified: Secondary | ICD-10-CM | POA: Diagnosis not present

## 2014-11-14 DIAGNOSIS — E785 Hyperlipidemia, unspecified: Secondary | ICD-10-CM | POA: Diagnosis present

## 2014-11-14 DIAGNOSIS — I5031 Acute diastolic (congestive) heart failure: Secondary | ICD-10-CM | POA: Diagnosis present

## 2014-11-14 DIAGNOSIS — E8881 Metabolic syndrome: Secondary | ICD-10-CM | POA: Diagnosis present

## 2014-11-14 DIAGNOSIS — Z7982 Long term (current) use of aspirin: Secondary | ICD-10-CM

## 2014-11-14 DIAGNOSIS — Z87891 Personal history of nicotine dependence: Secondary | ICD-10-CM | POA: Diagnosis not present

## 2014-11-14 DIAGNOSIS — I471 Supraventricular tachycardia: Secondary | ICD-10-CM | POA: Diagnosis not present

## 2014-11-14 DIAGNOSIS — I5189 Other ill-defined heart diseases: Secondary | ICD-10-CM | POA: Diagnosis present

## 2014-11-14 DIAGNOSIS — M109 Gout, unspecified: Secondary | ICD-10-CM | POA: Diagnosis not present

## 2014-11-14 DIAGNOSIS — I2510 Atherosclerotic heart disease of native coronary artery without angina pectoris: Secondary | ICD-10-CM | POA: Diagnosis not present

## 2014-11-14 DIAGNOSIS — M1990 Unspecified osteoarthritis, unspecified site: Secondary | ICD-10-CM | POA: Diagnosis not present

## 2014-11-14 DIAGNOSIS — R778 Other specified abnormalities of plasma proteins: Secondary | ICD-10-CM | POA: Diagnosis present

## 2014-11-14 DIAGNOSIS — I5033 Acute on chronic diastolic (congestive) heart failure: Secondary | ICD-10-CM | POA: Diagnosis present

## 2014-11-14 DIAGNOSIS — R7989 Other specified abnormal findings of blood chemistry: Secondary | ICD-10-CM

## 2014-11-14 DIAGNOSIS — J90 Pleural effusion, not elsewhere classified: Secondary | ICD-10-CM | POA: Diagnosis not present

## 2014-11-14 DIAGNOSIS — N183 Chronic kidney disease, stage 3 (moderate): Secondary | ICD-10-CM | POA: Diagnosis present

## 2014-11-14 DIAGNOSIS — I509 Heart failure, unspecified: Secondary | ICD-10-CM | POA: Diagnosis not present

## 2014-11-14 DIAGNOSIS — Z8679 Personal history of other diseases of the circulatory system: Secondary | ICD-10-CM

## 2014-11-14 LAB — CBC
HEMATOCRIT: 42.8 % (ref 39.0–52.0)
Hemoglobin: 14 g/dL (ref 13.0–17.0)
MCH: 31.3 pg (ref 26.0–34.0)
MCHC: 32.7 g/dL (ref 30.0–36.0)
MCV: 95.7 fL (ref 78.0–100.0)
Platelets: 123 10*3/uL — ABNORMAL LOW (ref 150–400)
RBC: 4.47 MIL/uL (ref 4.22–5.81)
RDW: 14.5 % (ref 11.5–15.5)
WBC: 6.4 10*3/uL (ref 4.0–10.5)

## 2014-11-14 LAB — BASIC METABOLIC PANEL
Anion gap: 8 (ref 5–15)
BUN: 13 mg/dL (ref 6–23)
CALCIUM: 8.7 mg/dL (ref 8.4–10.5)
CO2: 26 mmol/L (ref 19–32)
Chloride: 106 mmol/L (ref 96–112)
Creatinine, Ser: 1.15 mg/dL (ref 0.50–1.35)
GFR calc Af Amer: 66 mL/min — ABNORMAL LOW (ref >90)
GFR calc non Af Amer: 57 mL/min — ABNORMAL LOW (ref >90)
GLUCOSE: 120 mg/dL — AB (ref 70–99)
Potassium: 3.7 mmol/L (ref 3.5–5.1)
Sodium: 140 mmol/L (ref 135–145)

## 2014-11-14 LAB — TROPONIN I
TROPONIN I: 0.12 ng/mL — AB (ref <0.031)
TROPONIN I: 0.12 ng/mL — AB (ref <0.031)

## 2014-11-14 LAB — PROTIME-INR
INR: 1.39 (ref 0.00–1.49)
PROTHROMBIN TIME: 17.2 s — AB (ref 11.6–15.2)

## 2014-11-14 LAB — I-STAT TROPONIN, ED: Troponin i, poc: 0.09 ng/mL (ref 0.00–0.08)

## 2014-11-14 LAB — TSH: TSH: 0.676 u[IU]/mL (ref 0.350–4.500)

## 2014-11-14 LAB — BRAIN NATRIURETIC PEPTIDE: B Natriuretic Peptide: 734.6 pg/mL — ABNORMAL HIGH (ref 0.0–100.0)

## 2014-11-14 LAB — APTT: APTT: 101 s — AB (ref 24–37)

## 2014-11-14 LAB — MAGNESIUM: Magnesium: 2.1 mg/dL (ref 1.5–2.5)

## 2014-11-14 MED ORDER — ASPIRIN EC 81 MG PO TBEC
81.0000 mg | DELAYED_RELEASE_TABLET | Freq: Every day | ORAL | Status: DC
  Administered 2014-11-14 – 2014-11-16 (×2): 81 mg via ORAL
  Filled 2014-11-14 (×3): qty 1

## 2014-11-14 MED ORDER — SODIUM CHLORIDE 0.9 % IJ SOLN: 3.0000 mL | INTRAMUSCULAR | Status: DC | PRN

## 2014-11-14 MED ORDER — VITAMIN B-12 1000 MCG PO TABS
1000.0000 ug | ORAL_TABLET | Freq: Every day | ORAL | Status: DC
  Administered 2014-11-14 – 2014-11-17 (×4): 1000 ug via ORAL
  Filled 2014-11-14 (×4): qty 1

## 2014-11-14 MED ORDER — FUROSEMIDE 10 MG/ML IJ SOLN
80.0000 mg | Freq: Two times a day (BID) | INTRAMUSCULAR | Status: DC
  Administered 2014-11-14 – 2014-11-17 (×5): 80 mg via INTRAVENOUS
  Filled 2014-11-14 (×7): qty 8

## 2014-11-14 MED ORDER — METOPROLOL TARTRATE 25 MG PO TABS
50.0000 mg | ORAL_TABLET | Freq: Two times a day (BID) | ORAL | Status: DC
  Administered 2014-11-14 – 2014-11-17 (×6): 50 mg via ORAL
  Filled 2014-11-14: qty 4
  Filled 2014-11-14: qty 2
  Filled 2014-11-14 (×5): qty 1

## 2014-11-14 MED ORDER — TRAMADOL HCL 50 MG PO TABS
50.0000 mg | ORAL_TABLET | Freq: Every day | ORAL | Status: DC
  Administered 2014-11-14 – 2014-11-17 (×3): 50 mg via ORAL
  Filled 2014-11-14 (×3): qty 1

## 2014-11-14 MED ORDER — FENOFIBRATE 160 MG PO TABS
160.0000 mg | ORAL_TABLET | Freq: Every day | ORAL | Status: DC
Start: 2014-11-14 — End: 2014-11-17
  Administered 2014-11-14 – 2014-11-17 (×4): 160 mg via ORAL
  Filled 2014-11-14 (×4): qty 1

## 2014-11-14 MED ORDER — SODIUM CHLORIDE 0.9 % IJ SOLN
3.0000 mL | Freq: Two times a day (BID) | INTRAMUSCULAR | Status: DC
  Administered 2014-11-14 – 2014-11-16 (×4): 3 mL via INTRAVENOUS

## 2014-11-14 MED ORDER — HEPARIN BOLUS VIA INFUSION
4000.0000 [IU] | Freq: Once | INTRAVENOUS | Status: AC
  Administered 2014-11-14: 4000 [IU] via INTRAVENOUS
  Filled 2014-11-14: qty 4000

## 2014-11-14 MED ORDER — POTASSIUM CHLORIDE CRYS ER 20 MEQ PO TBCR
20.0000 meq | EXTENDED_RELEASE_TABLET | Freq: Two times a day (BID) | ORAL | Status: DC
  Administered 2014-11-14 – 2014-11-17 (×6): 20 meq via ORAL
  Filled 2014-11-14 (×7): qty 1

## 2014-11-14 MED ORDER — AMLODIPINE BESYLATE 5 MG PO TABS
5.0000 mg | ORAL_TABLET | Freq: Every day | ORAL | Status: DC
  Administered 2014-11-14 – 2014-11-17 (×4): 5 mg via ORAL
  Filled 2014-11-14 (×4): qty 1

## 2014-11-14 MED ORDER — ONDANSETRON HCL 4 MG/2ML IJ SOLN: 4.0000 mg | Freq: Four times a day (QID) | INTRAMUSCULAR | Status: DC | PRN

## 2014-11-14 MED ORDER — FINASTERIDE 5 MG PO TABS
5.0000 mg | ORAL_TABLET | Freq: Every day | ORAL | Status: DC
  Administered 2014-11-14 – 2014-11-17 (×4): 5 mg via ORAL
  Filled 2014-11-14 (×4): qty 1

## 2014-11-14 MED ORDER — TAMSULOSIN HCL 0.4 MG PO CAPS
0.4000 mg | ORAL_CAPSULE | Freq: Every day | ORAL | Status: DC
  Administered 2014-11-14 – 2014-11-17 (×4): 0.4 mg via ORAL
  Filled 2014-11-14 (×4): qty 1

## 2014-11-14 MED ORDER — FLUTICASONE PROPIONATE 50 MCG/ACT NA SUSP
2.0000 | Freq: Every day | NASAL | Status: DC
  Administered 2014-11-14 – 2014-11-17 (×4): 2 via NASAL
  Filled 2014-11-14 (×2): qty 16

## 2014-11-14 MED ORDER — SODIUM CHLORIDE 0.9 % IV SOLN
250.0000 mL | INTRAVENOUS | Status: DC | PRN
  Administered 2014-11-14: 250 mL via INTRAVENOUS

## 2014-11-14 MED ORDER — ALPRAZOLAM 0.25 MG PO TABS: 0.2500 mg | ORAL_TABLET | Freq: Two times a day (BID) | ORAL | Status: DC | PRN

## 2014-11-14 MED ORDER — DICLOFENAC SODIUM 1 % TD GEL
4.0000 g | Freq: Three times a day (TID) | TRANSDERMAL | Status: DC | PRN
  Administered 2014-11-14: 4 g via TOPICAL
  Filled 2014-11-14: qty 100

## 2014-11-14 MED ORDER — HEPARIN (PORCINE) IN NACL 100-0.45 UNIT/ML-% IJ SOLN
1300.0000 [IU]/h | INTRAMUSCULAR | Status: DC
  Administered 2014-11-14 – 2014-11-16 (×3): 1300 [IU]/h via INTRAVENOUS
  Filled 2014-11-14 (×6): qty 250

## 2014-11-14 MED ORDER — FUROSEMIDE 10 MG/ML IJ SOLN
80.0000 mg | Freq: Once | INTRAMUSCULAR | Status: AC
  Administered 2014-11-14: 80 mg via INTRAVENOUS
  Filled 2014-11-14: qty 8

## 2014-11-14 MED ORDER — ACETAMINOPHEN 325 MG PO TABS: 650.0000 mg | ORAL_TABLET | ORAL | Status: DC | PRN

## 2014-11-14 NOTE — ED Notes (Signed)
Lab results reported to Tracie at [facility].

## 2014-11-14 NOTE — ED Notes (Signed)
Pt. States he has been retaining fluid and has increased "fluid pill" over the past couple of days. Pitting edema noted to lower legs. Denies SOB currently.

## 2014-11-14 NOTE — H&P (Signed)
[name] is an 81 y.o. male.    Primary Cardiologist:Dr. Lorenda Hatchet TOM, MD  Chief Complaint: increasing edema HPI: 81 year old male with hx of G2 diastolic HF and echo with an echogenic mobile mass on the anterior leaflet of the mitral valve with mild regurgitation/ Pt has had follow up echoes ordered but not done.  He also has hx of palpitations/SVT and puts his face in a bucket of cold water when they come on.  Also with pulmonary HTN and mild chronic edema.   Today he presents with increasing edema not responding with increasing his fluid pill his lasix to 80 mg daily, up from 40 mg.   BNP 734 troponin 0.12, new small bil. Pleural effusions.   EKG SR without acute changes.  No cardiac cath hx and no nuc study found. CKD stage 3 with GFR of 57.   He denies chest pain, denies SOB even with exertion.  His wt is up 10 lbs in last few weeks.    Past Medical History  Diagnosis Date  . Hypertension   . OA (osteoarthritis)     rt leg  . ED (erectile dysfunction)   . Chronic fatigue   . Gout   . Bowing of leg   . Spinal stenosis   . Prostate cancer   . CHF (congestive heart failure)     Past Surgical History  Procedure Laterality Date  . Back surgery x 2    . Rt rotator cuff repair    . Cervical spine surgery    . Back surgery      Family History  Problem Relation Age of Onset  . Heart failure Mother 84  . Heart attack Mother   . Heart attack Brother    Social History:  reports that he has quit smoking. He has never used smokeless tobacco. He reports that he does not drink alcohol or use illicit drugs.  Allergies: No Known Allergies  OUTPATIENT MEDICATIONS: No current facility-administered medications on file prior to encounter.   Current Outpatient Prescriptions on File Prior to Encounter  Medication Sig Dispense Refill  . diclofenac sodium (VOLTAREN) 1 % GEL Apply 4 g topically 3 (three) times daily as needed (pain).     .  fenofibrate (TRICOR) 145 MG tablet Take 145 mg by mouth daily.    . finasteride (PROSCAR) 5 MG tablet Take 1 tablet (5 mg total) by mouth daily. 30 tablet 11  . fluticasone (FLONASE) 50 MCG/ACT nasal spray USE TWO SPRAYS IN EACH NOSTRIL DAILY 16 g 11  . furosemide (LASIX) 40 MG tablet TAKE ONE TO TWO TABLETS ONCE DAILY AS NEEDED FOR LEG SWELLING 60 tablet 5  . Menthol-Methyl Salicylate (MUSCLE RUB) 10-15 % CREA Apply 1 application topically 2 (two) times daily.     . metolazone (ZAROXOLYN) 5 MG tablet TAKE ONE (1) TABLET EACH DAY 30 tablet 3  . metoprolol (LOPRESSOR) 100 MG tablet Take 0.5 tablets (50 mg total) by mouth 2 (two) times daily. (Patient taking differently: Take 100 mg by mouth daily. ) 30 tablet 5  . potassium chloride SA (K-DUR,KLOR-CON) 20 MEQ tablet TAKE TWO (2) TABLETS BY MOUTH EVERY DAY 60 tablet 3  . tamsulosin (FLOMAX) 0.4 MG CAPS capsule Take 0.4 mg by mouth daily.     . traMADol (ULTRAM) 50 MG tablet TAKE ONE TABLET BY MOUTH EVERY SIX HOURSAS NEEDED FOR PAIN (Patient taking differently: TAKE ONE TABLET BY MOUTH DAILY  AT BEDTIME) 40 tablet 0  . vitamin B-12 (CYANOCOBALAMIN) 1000 MCG tablet Take 1,000 mcg by mouth daily.    . metolazone (ZAROXOLYN) 5 MG tablet TAKE ONE (1) TABLET EACH DAY (Patient not taking: Reported on 11/14/2014) 30 tablet 3  . ondansetron (ZOFRAN ODT) 8 MG disintegrating tablet Take 1 tablet (8 mg total) by mouth every 8 (eight) hours as needed for nausea or vomiting. (Patient not taking: Reported on 11/14/2014) 10 tablet 0  . sildenafil (VIAGRA) 100 MG tablet Take 0.5-1 tablets (50-100 mg total) by mouth daily as needed for erectile dysfunction. (Patient not taking: Reported on 11/14/2014) 6 tablet 11      Results for orders placed or performed during the hospital encounter of 11/14/14 (from the past 48 hour(s))  Basic metabolic panel     Status: Abnormal   Collection Time: 11/14/14  2:02 PM  Result Value Ref Range   Sodium 140 135 - 145 mmol/L    Potassium 3.7 3.5 - 5.1 mmol/L   Chloride 106 96 - 112 mmol/L   CO2 26 19 - 32 mmol/L   Glucose, Bld 120 (H) 70 - 99 mg/dL   BUN 13 6 - 23 mg/dL   Creatinine, Ser 1.15 0.50 - 1.35 mg/dL   Calcium 8.7 8.4 - 10.5 mg/dL   GFR calc non Af Amer 57 (L) >90 mL/min   GFR calc Af Amer 66 (L) >90 mL/min    Comment: (NOTE) The eGFR has been calculated using the CKD EPI equation. This calculation has not been validated in all clinical situations. eGFR's persistently <90 mL/min signify possible Chronic Kidney Disease.    Anion gap 8 5 - 15  BNP (order ONLY if patient complains of dyspnea/SOB AND you have documented it for THIS visit)     Status: Abnormal   Collection Time: 11/14/14  2:02 PM  Result Value Ref Range   B Natriuretic Peptide 734.6 (H) 0.0 - 100.0 pg/mL  CBC     Status: Abnormal   Collection Time: 11/14/14  2:02 PM  Result Value Ref Range   WBC 6.4 4.0 - 10.5 K/uL   RBC 4.47 4.22 - 5.81 MIL/uL   Hemoglobin 14.0 13.0 - 17.0 g/dL   HCT 42.8 39.0 - 52.0 %   MCV 95.7 78.0 - 100.0 fL   MCH 31.3 26.0 - 34.0 pg   MCHC 32.7 30.0 - 36.0 g/dL   RDW 14.5 11.5 - 15.5 %   Platelets 123 (L) 150 - 400 K/uL  I-stat troponin, ED (not at [facility])     Status: Abnormal   Collection Time: 11/14/14  2:13 PM  Result Value Ref Range   Troponin i, poc 0.09 (HH) 0.00 - 0.08 ng/mL   Comment NOTIFIED PHYSICIAN    Comment 3            Comment: Due to the release kinetics of cTnI, a negative result within the first hours of the onset of symptoms does not rule out myocardial infarction with certainty. If myocardial infarction is still suspected, repeat the test at appropriate intervals.   Troponin I     Status: Abnormal   Collection Time: 11/14/14  4:11 PM  Result Value Ref Range   Troponin I 0.12 (H) <0.031 ng/mL    Comment:        PERSISTENTLY INCREASED TROPONIN VALUES IN THE RANGE OF 0.04-0.49 ng/mL CAN BE SEEN IN:       -UNSTABLE ANGINA       -CONGESTIVE HEART FAILURE       -  MYOCARDITIS        -CHEST TRAUMA       -ARRYHTHMIAS       -LATE PRESENTING MYOCARDIAL INFARCTION       -COPD   CLINICAL FOLLOW-UP RECOMMENDED.    Dg Chest 2 View  11/14/2014   CLINICAL DATA:  Congestive heart failure  EXAM: CHEST  2 VIEW  COMPARISON:  Radiograph 06/19/2014  FINDINGS: Stable enlarged cardiac silhouette. Lungs are hyperinflated. There is chronic bronchitic markings centrally. Bilateral small effusions new from prior. Continuous osteophytosis of the thoracic spine.  IMPRESSION: 1. Hyperinflated lungs with chronic bronchitic markings. 2. Small bilateral pleural effusions are new .   Electronically Signed   By: Suzy Bouchard M.D.   On: 11/14/2014 16:21    ROS: General:no colds or fevers, increased weight since he got married 6 months ago, but 10 lbs over last 1-2 weeks.  Skin:no rashes or ulcers HEENT:no blurred vision, no congestion CV:see HPI PUL:see HPI GI:no diarrhea constipation or melena, no indigestion GU:no hematuria, no dysuria MS:+ joint pain, no claudication Neuro:no syncope, no lightheadedness Endo:no diabetes, no thyroid disease   Blood pressure 136/66, pulse 50, temperature 98.1 F (36.7 C), temperature source Oral, resp. rate 15, SpO2 96 %. PE: General:Pleasant affect, NAD Skin:Warm and dry, brisk capillary refill HEENT:normocephalic, sclera clear, mucus membranes moist Neck:supple, + JVD, no bruits  Heart:S1S2 RRR with 2-3 systolic murmur, no gallup, rub or click Lungs: with rales in bases, no rhonchi, or wheezes SVX:BLTJQ,ZESP, non tender, + BS, do not palpate liver spleen or masses Ext:+2-3 lower ext edema to just below his knees, 1+ pedal pulses, 2+ radial pulses Neuro:alert and oriented X 3, MAE, follows commands, + facial symmetry, no speech abnormalities once I heard his accent.    Assessment/Plan Principal Problem:   Acute diastolic HF (heart failure), edema and wt gain since Sat and some before.  rec'd 80 mg IV lasix here feeling better. Continue to diuresis.     Active Problems:   Elevated troponin- may be demand ischemia from CHF, but no hx of cardiac cath will plan lexiscan myoview, add IV heparin for now.    Hypertension- controlled, continue home meds.   Metabolic syndrome   Peripheral edema 2-3+ to just below knees   echogenic mobile mass on the anterior leaflet of the mitral valve-previously vs. Flail MV leaflet  with mild regurgitation 2014- recheck echo may need TEE    [facility] R Nurse Practitioner Certified [place] Pager [phone] or after 5pm or weekends call [phone] 11/14/2014, 4:49 PM    Patient examined chart reviewed.  Significant volume overload and edema. Good response to lasix in ER.  I reviewed his echo from 06/2013 and feel he hard a flail segment of anterior leaflet with at least moderate MR.  Has loud MR murmur on exam and suspect this may be his prime issue.  He keeps talking about going home.  Willing to come in and have myvoue to r/o CAD with CHF and elevated troponin and update TTE for MR.  Ideally he would have TEE but will start with above  If he does have severe MR with flail would push for TEE and right and left cath while in hospital given his poor f/u before.  Discussed val;ve disease with patient and his new wife and willing to stay atleast for ow  Jenkins Rouge

## 2014-11-14 NOTE — Telephone Encounter (Signed)
?   OK to Refill  

## 2014-11-14 NOTE — Progress Notes (Signed)
ANTICOAGULATION CONSULT NOTE - Initial Consult  Pharmacy Consult for heparin Indication: chest pain/ACS  No Known Allergies  Patient Measurements: Weight: 237 lb (107.502 kg) Heparin Dosing Weight: 96 kg  Vital Signs: Temp: 98.1 F (36.7 C) (04/11 1328) Temp Source: Oral (04/11 1328) BP: 164/69 mmHg (04/11 1823) Pulse Rate: 54 (04/11 1800)  Labs:  Recent Labs  11/14/14 1402 11/14/14 1611  HGB 14.0  --   HCT 42.8  --   PLT 123*  --   CREATININE 1.15  --   TROPONINI  --  0.12*    Estimated Creatinine Clearance: 59.8 mL/min (by C-G formula based on Cr of 1.15).   Medical History: Past Medical History  Diagnosis Date  . Hypertension   . OA (osteoarthritis)     rt leg  . ED (erectile dysfunction)   . Chronic fatigue   . Gout   . Bowing of leg   . Spinal stenosis   . Prostate cancer   . CHF (congestive heart failure)   . Elevated troponin 11/14/2014    Medications:   (Not in a hospital admission)  Assessment: 12 yoM who presents with SOB, found to have elevated troponin likely due to demand ischemia from CHF. Per cardiology, would like to start IV heparin to rule out ACS. The patient reported that he was not on any anticoagulation prior to admission with no significant history of bleeding.  trop 0.09-0.12, CrCl ~ 60 ml/min, H/H wnl, platelets 123.  Goal of Therapy:  Heparin level 0.3-0.7 units/ml Monitor platelets by anticoagulation protocol: Yes   Plan:  Heparin 4000 unit IV bolus, then 1300 unit/hr 8 hour HL (0300) Daily HL and CBC Monitor for signs of bleeding  Albertina Parr, PharmD., BCPS Clinical Pharmacist Pager [phone]

## 2014-11-14 NOTE — ED Provider Notes (Signed)
CSN: [id number]     Arrival date & time 11/14/14  1246 History   First MD Initiated Contact with Patient 11/14/14 1540     Chief Complaint  Patient presents with  . Congestive Heart Failure    Patient is a 81 y.o. male presenting with shortness of breath. The history is provided by the patient and the spouse.  Shortness of Breath Severity:  Moderate Onset quality:  Gradual Duration:  2 days Timing:  Intermittent Progression:  Unchanged Chronicity:  New Relieved by:  Nothing Worsened by:  Exertion Associated symptoms: no abdominal pain, no chest pain, no fever and no headaches   Patient reports increasing SOB and LE edema for past 2 days No CP/weakness/dizziness No fever/vomiting He reports taking lasix 80mg  daily (except for today he took 40mg  daily) But is not "moving any fluid" He reports h/o CHF   Past Medical History  Diagnosis Date  . Hypertension   . OA (osteoarthritis)     rt leg  . ED (erectile dysfunction)   . Chronic fatigue   . Gout   . Bowing of leg   . Spinal stenosis   . Prostate cancer   . CHF (congestive heart failure)    Past Surgical History  Procedure Laterality Date  . Back surgery x 2    . Rt rotator cuff repair    . Cervical spine surgery    . Back surgery     Family History  Problem Relation Age of Onset  . Heart failure Mother 50   History  Substance Use Topics  . Smoking status: Former Research scientist (life sciences)  . Smokeless tobacco: Never Used     Comment: QUIT SMOKING  MANY YEARS AGO"  . Alcohol Use: No    Review of Systems  Constitutional: Negative for fever.  Respiratory: Positive for shortness of breath.   Cardiovascular: Positive for leg swelling. Negative for chest pain.  Gastrointestinal: Negative for abdominal pain.  Neurological: Negative for syncope and headaches.  All other systems reviewed and are negative.     Allergies  Review of patient's allergies indicates no known allergies.  Home Medications   Prior to Admission  medications   Medication Sig Start Date End Date Taking? Authorizing Provider  diclofenac sodium (VOLTAREN) 1 % GEL Apply 4 g topically 3 (three) times daily as needed (pain).    Yes Historical Provider, MD  fenofibrate (TRICOR) 145 MG tablet Take 145 mg by mouth daily.   Yes Historical Provider, MD  finasteride (PROSCAR) 5 MG tablet Take 1 tablet (5 mg total) by mouth daily. 05/19/14  Yes [name], MD  fluticasone [facility]) 50 MCG/ACT nasal spray USE TWO SPRAYS IN [facility] NOSTRIL DAILY 08/09/14  Yes [name], MD  furosemide (LASIX) 40 MG tablet TAKE ONE TO TWO TABLETS ONCE DAILY AS NEEDED FOR LEG SWELLING 07/06/14  Yes [name], MD  Menthol-Methyl Salicylate (MUSCLE RUB) 10-15 % CREA Apply 1 application topically 2 (two) times daily.    Yes Historical Provider, MD  metolazone (ZAROXOLYN) 5 MG tablet TAKE ONE (1) TABLET EACH DAY 10/21/14  Yes [name], MD  metoprolol (LOPRESSOR) 100 MG tablet Take 0.5 tablets (50 mg total) by mouth 2 (two) times daily. Patient taking differently: Take 100 mg by mouth daily.  07/19/14  Yes [name], MD  potassium chloride SA (K-DUR,KLOR-CON) 20 MEQ tablet TAKE TWO (2) TABLETS BY MOUTH EVERY DAY 03/30/14  Yes [name], MD  tamsulosin (FLOMAX) 0.4 MG CAPS capsule  Take 0.4 mg by mouth daily.  06/20/14  Yes Historical Provider, MD  traMADol (ULTRAM) 50 MG tablet TAKE ONE TABLET BY MOUTH EVERY SIX HOURSAS NEEDED FOR PAIN Patient taking differently: TAKE ONE TABLET BY MOUTH DAILY AT BEDTIME 09/20/14  Yes [name], MD  vitamin B-12 (CYANOCOBALAMIN) 1000 MCG tablet Take 1,000 mcg by mouth daily.   Yes Historical Provider, MD  metolazone (ZAROXOLYN) 5 MG tablet TAKE ONE (1) TABLET EACH DAY Patient not taking: Reported on 11/14/2014 10/11/14   [name], MD  ondansetron (ZOFRAN ODT) 8 MG disintegrating tablet Take 1 tablet (8 mg total) by mouth every 8 (eight) hours as needed for nausea or vomiting. Patient not taking:  Reported on 11/14/2014 06/19/14   Jola Schmidt, MD  sildenafil (VIAGRA) 100 MG tablet Take 0.5-1 tablets (50-100 mg total) by mouth daily as needed for erectile dysfunction. Patient not taking: Reported on 11/14/2014 07/19/14   [name], MD   BP 132/72 mmHg  Pulse 55  Temp(Src) 98.1 F (36.7 C) (Oral)  Resp 18  SpO2 100% Physical Exam CONSTITUTIONAL: Well developed/well nourished HEAD: Normocephalic/atraumatic EYES: EOMI/PERRL ENMT: Mucous membranes moist NECK: supple no meningeal signs SPINE/BACK:entire spine nontender CV: S1/S2 noted, murmur noted LUNGS: Lungs are clear to auscultation bilaterally, no apparent distress ABDOMEN: soft, nontender, no rebound or guarding GU:no cva tenderness NEURO: Pt is awake/alert/appropriate, moves all extremitiesx4.  No facial droop.  No arm/leg drift.  EXTREMITIES: pulses normal/equal, full ROM, 2+ pitting edema bilateral LE SKIN: warm, color normal PSYCH: no abnormalities of mood noted, alert and oriented to situation  ED Course  Procedures  CRITICAL CARE Performed by: Sharyon Cable Total critical care time: 32 Critical care time was exclusive of separately billable procedures and treating other patients. Critical care was necessary to treat or prevent imminent or life-threatening deterioration. Critical care was time spent personally by me on the following activities: development of treatment plan with patient and/or surrogate as well as nursing, discussions with consultants, evaluation of patient's response to treatment, examination of patient, obtaining history from patient or surrogate, ordering and performing treatments and interventions, ordering and review of laboratory studies, ordering and review of radiographic studies, pulse oximetry and re-evaluation of patient's condition. PATIENT WITH CHF AND ALSO NON-STEMI, ADMITTED TO CARDIOLOGY  Medications  furosemide (LASIX) injection 80 mg (80 mg Intravenous Given 11/14/14 1620)      3:58 PM Pt noted to have elevated troponin He denies CP EKG unchanged from prior CXR pending.  Will need cardiology consultation after CXR results Pt reports he can NOT take ASA Also no signs of acute CVA noted on exam, pt denies weakness or change in speech (nursing note reviewed) 4:37 PM D/w cardiology will evaluate patient 6:06 PM PT TO BE ADMITTED TO CARDIOLOGY BP 144/77 mmHg  Pulse 63  Temp(Src) 98.1 F (36.7 C) (Oral)  Resp 18  SpO2 97%  Labs Review Labs Reviewed  BASIC METABOLIC PANEL - Abnormal; Notable for the following:    Glucose, Bld 120 (*)    GFR calc non Af Amer 57 (*)    GFR calc Af Amer 66 (*)    All other components within normal limits  BRAIN NATRIURETIC PEPTIDE - Abnormal; Notable for the following:    B Natriuretic Peptide 734.6 (*)    All other components within normal limits  CBC - Abnormal; Notable for the following:    Platelets 123 (*)    All other components within normal limits  TROPONIN I - Abnormal;  Notable for the following:    Troponin I 0.12 (*)    All other components within normal limits  I-STAT TROPOININ, ED - Abnormal; Notable for the following:    Troponin i, poc 0.09 (*)    All other components within normal limits    Imaging Review Dg Chest 2 View  11/14/2014   CLINICAL DATA:  Congestive heart failure  EXAM: CHEST  2 VIEW  COMPARISON:  Radiograph 06/19/2014  FINDINGS: Stable enlarged cardiac silhouette. Lungs are hyperinflated. There is chronic bronchitic markings centrally. Bilateral small effusions new from prior. Continuous osteophytosis of the thoracic spine.  IMPRESSION: 1. Hyperinflated lungs with chronic bronchitic markings. 2. Small bilateral pleural effusions are new .   Electronically Signed   By: Suzy Bouchard M.D.   On: 11/14/2014 16:21     EKG Interpretation   Date/Time:  Monday November 14 2014 15:45:47 EDT Ventricular Rate:  58 PR Interval:  185 QRS Duration: 104 QT Interval:  489 QTC Calculation:  480 R Axis:   -41 Text Interpretation:  Sinus rhythm Probable left atrial enlargement Left  axis deviation Anteroseptal infarct, old No significant change since last  tracing Confirmed by Christy Gentles  MD, [place] ([id number]) on 11/14/2014 3:55:48 PM      MDM   Final diagnoses:  Non-STEMI (non-ST elevated myocardial infarction)  Acute diastolic congestive heart failure    Nursing notes including past medical history and social history reviewed and considered in documentation Labs/vital reviewed myself and considered during evaluation Previous records reviewed and considered - h/o CHF with preserved EF     Ripley Fraise, MD 11/14/14 [phone]

## 2014-11-14 NOTE — Telephone Encounter (Signed)
ok 

## 2014-11-14 NOTE — ED Notes (Signed)
Sandwich and soda was offered to the patient.

## 2014-11-14 NOTE — ED Notes (Addendum)
Pt. Appears to have a slight right sided facial droop. Pt. Speech seems slurred to this RN but patient states he does not have trouble speaking. Unknown last seen normal.

## 2014-11-15 ENCOUNTER — Other Ambulatory Visit (HOSPITAL_COMMUNITY): Payer: Medicare Other

## 2014-11-15 ENCOUNTER — Ambulatory Visit: Payer: Self-pay | Admitting: Family Medicine

## 2014-11-15 ENCOUNTER — Inpatient Hospital Stay (HOSPITAL_COMMUNITY): Payer: Medicare Other

## 2014-11-15 DIAGNOSIS — Z8679 Personal history of other diseases of the circulatory system: Secondary | ICD-10-CM

## 2014-11-15 DIAGNOSIS — I5189 Other ill-defined heart diseases: Secondary | ICD-10-CM | POA: Diagnosis present

## 2014-11-15 DIAGNOSIS — R079 Chest pain, unspecified: Secondary | ICD-10-CM

## 2014-11-15 DIAGNOSIS — E785 Hyperlipidemia, unspecified: Secondary | ICD-10-CM | POA: Diagnosis present

## 2014-11-15 DIAGNOSIS — Z8546 Personal history of malignant neoplasm of prostate: Secondary | ICD-10-CM

## 2014-11-15 LAB — BASIC METABOLIC PANEL
ANION GAP: 8 (ref 5–15)
BUN: 15 mg/dL (ref 6–23)
CO2: 28 mmol/L (ref 19–32)
Calcium: 8.7 mg/dL (ref 8.4–10.5)
Chloride: 102 mmol/L (ref 96–112)
Creatinine, Ser: 1.24 mg/dL (ref 0.50–1.35)
GFR calc Af Amer: 60 mL/min — ABNORMAL LOW (ref >90)
GFR calc non Af Amer: 52 mL/min — ABNORMAL LOW (ref >90)
Glucose, Bld: 125 mg/dL — ABNORMAL HIGH (ref 70–99)
Potassium: 3.4 mmol/L — ABNORMAL LOW (ref 3.5–5.1)
SODIUM: 138 mmol/L (ref 135–145)

## 2014-11-15 LAB — CBC
HCT: 42.6 % (ref 39.0–52.0)
HEMOGLOBIN: 14 g/dL (ref 13.0–17.0)
MCH: 31 pg (ref 26.0–34.0)
MCHC: 32.9 g/dL (ref 30.0–36.0)
MCV: 94.5 fL (ref 78.0–100.0)
Platelets: 127 10*3/uL — ABNORMAL LOW (ref 150–400)
RBC: 4.51 MIL/uL (ref 4.22–5.81)
RDW: 14.4 % (ref 11.5–15.5)
WBC: 8 10*3/uL (ref 4.0–10.5)

## 2014-11-15 LAB — HEPARIN LEVEL (UNFRACTIONATED)
Heparin Unfractionated: 0.39 IU/mL (ref 0.30–0.70)
Heparin Unfractionated: 0.49 IU/mL (ref 0.30–0.70)

## 2014-11-15 LAB — TROPONIN I
Troponin I: 0.16 ng/mL — ABNORMAL HIGH (ref <0.031)
Troponin I: 0.15 ng/mL — ABNORMAL HIGH (ref <0.031)

## 2014-11-15 MED ORDER — TECHNETIUM TC 99M SESTAMIBI GENERIC - CARDIOLITE
10.0000 | Freq: Once | INTRAVENOUS | Status: AC | PRN
  Administered 2014-11-15: 10 via INTRAVENOUS

## 2014-11-15 MED ORDER — REGADENOSON 0.4 MG/5ML IV SOLN
INTRAVENOUS | Status: AC
  Filled 2014-11-15: qty 5

## 2014-11-15 MED ORDER — SODIUM CHLORIDE 0.9 % IV SOLN
1.0000 mL/kg/h | INTRAVENOUS | Status: DC
  Administered 2014-11-16: 1 mL/kg/h via INTRAVENOUS

## 2014-11-15 MED ORDER — TECHNETIUM TC 99M SESTAMIBI GENERIC - CARDIOLITE
30.0000 | Freq: Once | INTRAVENOUS | Status: AC | PRN
  Administered 2014-11-15: 30 via INTRAVENOUS

## 2014-11-15 MED ORDER — REGADENOSON 0.4 MG/5ML IV SOLN
0.4000 mg | Freq: Once | INTRAVENOUS | Status: AC
  Filled 2014-11-15: qty 5

## 2014-11-15 MED ORDER — REGADENOSON 0.4 MG/5ML IV SOLN
0.4000 mg | Freq: Once | INTRAVENOUS | Status: AC
  Administered 2014-11-15: 0.4 mg via INTRAVENOUS

## 2014-11-15 NOTE — Progress Notes (Signed)
Patient ID: [name], male   DOB: 1928-11-04, 81 y.o.   MRN: [id number]  Due to technical problems with the radiology reporting system, this is a preliminary report for the patient's Lexiscan Cardiolite:   No significant ECG changes with Lexiscan infusion.  There was a medium-sized, mild mid to apical anteroseptal and apical lateral perfusion defect on stress images.  On rest images, there was a small, mild apical perfusion defect.  EF 53%, no regional wall motion abnormalities.   Intermediate risk study with possible ischemia involving the mid to apical anteroseptal wall and the apical lateral wall.   [name] 11/15/2014 4:42 PM

## 2014-11-15 NOTE — Progress Notes (Signed)
ANTICOAGULATION CONSULT NOTE - Follow Up Consult  Pharmacy Consult for heparin Indication: chest pain/ACS  Labs:  Recent Labs  11/14/14 1402 11/14/14 1611 11/14/14 2117 11/15/14 0305 11/15/14 0830  HGB 14.0  --   --  14.0  --   HCT 42.8  --   --  42.6  --   PLT 123*  --   --  127*  --   APTT  --   --  101*  --   --   LABPROT  --   --  17.2*  --   --   INR  --   --  1.39  --   --   HEPARINUNFRC  --   --   --  0.39 0.49  CREATININE 1.15  --   --  1.24  --   TROPONINI  --  0.12* 0.12* 0.16*  --     Assessment/Plan:  81yo male therapeutic on heparin with initial dosing for elevated troponin.  Heparin drip therapeutic x 1  Continue heparin drip at 1300 units / hr Follow up AM labs  Thank you. Anette Guarneri, PharmD [phone]  11/15/2014,10:31 AM

## 2014-11-15 NOTE — Telephone Encounter (Signed)
rx called in

## 2014-11-15 NOTE — Progress Notes (Signed)
Risks and benefits of cath explained to pt and he is willing to proceed. He is on the board for tomorrow.  Kerin Ransom PA-C 11/15/2014 6:03 PM

## 2014-11-15 NOTE — Progress Notes (Signed)
    Subjective:  Comfortable this am, no SOB.  Objective:  Vital Signs in the last 24 hours: Temp:  [97.9 F (36.6 C)-98.2 F (36.8 C)] 97.9 F (36.6 C) (04/12 0721) Pulse Rate:  [50-79] 79 (04/12 1012) Resp:  [15-24] 18 (04/12 0721) BP: (118-164)/(53-114) 141/75 mmHg (04/12 1015) SpO2:  [93 %-100 %] 96 % (04/12 0721) Weight:  [232 lb 3.2 oz (105.325 kg)-237 lb (107.502 kg)] 232 lb 3.2 oz (105.325 kg) (04/12 0721)  Intake/Output from previous day:  Intake/Output Summary (Last 24 hours) at 11/15/14 1015 Last data filed at 11/15/14 0900  Gross per 24 hour  Intake  509.4 ml  Output   3650 ml  Net -3140.6 ml    Physical Exam: General appearance: alert, cooperative and no distress Lungs: clear to auscultation bilaterally Heart: regular rate and rhythm and 2/6 MR murmur Abdomen: soft, non-tender; bowel sounds normal; no masses,  no organomegaly Extremities: extremities normal, atraumatic, no cyanosis or edema   Rate: 78  Rhythm: normal sinus rhythm  Lab Results:  Recent Labs  11/14/14 1402 11/15/14 0305  WBC 6.4 8.0  HGB 14.0 14.0  PLT 123* 127*    Recent Labs  11/14/14 1402 11/15/14 0305  NA 140 138  K 3.7 3.4*  CL 106 102  CO2 26 28  GLUCOSE 120* 125*  BUN 13 15  CREATININE 1.15 1.24    Recent Labs  11/14/14 2117 11/15/14 0305  TROPONINI 0.12* 0.16*    Recent Labs  11/14/14 2117  INR 1.39    Imaging: Dg Chest 2 View  11/14/2014   CLINICAL DATA:  Congestive heart failure  EXAM: CHEST  2 VIEW  COMPARISON:  Radiograph 06/19/2014  FINDINGS: Stable enlarged cardiac silhouette. Lungs are hyperinflated. There is chronic bronchitic markings centrally. Bilateral small effusions new from prior. Continuous osteophytosis of the thoracic spine.  IMPRESSION: 1. Hyperinflated lungs with chronic bronchitic markings. 2. Small bilateral pleural effusions are new .   Electronically Signed   By: Suzy Bouchard M.D.   On: 11/14/2014 16:21    Cardiac  Studies:  Assessment/Plan:   Principal Problem:   Acute diastolic HF (heart failure) Active Problems:   Hypertension   Metabolic syndrome   Peripheral edema   Elevated troponin   Acute diastolic HF (heart failure), NYHA class 3   PLAN: Myoview this am- tolerated well  Kerin Ransom PA-C Beeper [phone] 11/15/2014, 10:15 AM  Patient is not presently having any chest pain. No dyspnea. States he tolerated stress test okay. Results pending. 2D echo pending. He has a prominent systolic and diastolic murmur at LSE. Continue IV heparin for now. Had some penile bleeding earlier today. Urine in urinal does not appear bloody.

## 2014-11-15 NOTE — Progress Notes (Signed)
ANTICOAGULATION CONSULT NOTE - Follow Up Consult  Pharmacy Consult for heparin Indication: chest pain/ACS  Labs:  Recent Labs  11/14/14 1402 11/14/14 1611 11/14/14 2117 11/15/14 0305  HGB 14.0  --   --  14.0  HCT 42.8  --   --  42.6  PLT 123*  --   --  127*  APTT  --   --  101*  --   LABPROT  --   --  17.2*  --   INR  --   --  1.39  --   HEPARINUNFRC  --   --   --  0.39  CREATININE 1.15  --   --   --   TROPONINI  --  0.12* 0.12*  --     Assessment/Plan:  81yo male therapeutic on heparin with initial dosing for elevated troponin. Will continue gtt at current rate and confirm stable with additional level.   Wynona Neat, PharmD, BCPS  11/15/2014,3:47 AM

## 2014-11-16 ENCOUNTER — Encounter (HOSPITAL_COMMUNITY)
Admission: EM | Disposition: A | Discharge status: Home / Self Care | Payer: Medicare Other | Source: Home / Self Care | Attending: Cardiovascular Disease

## 2014-11-16 ENCOUNTER — Encounter (HOSPITAL_COMMUNITY): Payer: Self-pay | Admitting: Cardiovascular Disease

## 2014-11-16 DIAGNOSIS — I340 Nonrheumatic mitral (valve) insufficiency: Secondary | ICD-10-CM | POA: Diagnosis not present

## 2014-11-16 DIAGNOSIS — I2510 Atherosclerotic heart disease of native coronary artery without angina pectoris: Secondary | ICD-10-CM

## 2014-11-16 DIAGNOSIS — I509 Heart failure, unspecified: Secondary | ICD-10-CM | POA: Diagnosis not present

## 2014-11-16 DIAGNOSIS — I214 Non-ST elevation (NSTEMI) myocardial infarction: Secondary | ICD-10-CM | POA: Insufficient documentation

## 2014-11-16 HISTORY — PX: LEFT HEART CATHETERIZATION WITH CORONARY ANGIOGRAM: SHX5451

## 2014-11-16 LAB — CBC
HEMATOCRIT: 43.7 % (ref 39.0–52.0)
Hemoglobin: 14.4 g/dL (ref 13.0–17.0)
MCH: 31.4 pg (ref 26.0–34.0)
MCHC: 33 g/dL (ref 30.0–36.0)
MCV: 95.2 fL (ref 78.0–100.0)
PLATELETS: 122 10*3/uL — AB (ref 150–400)
RBC: 4.59 MIL/uL (ref 4.22–5.81)
RDW: 14.4 % (ref 11.5–15.5)
WBC: 7.6 10*3/uL (ref 4.0–10.5)

## 2014-11-16 LAB — POCT ACTIVATED CLOTTING TIME: Activated Clotting Time: 534 seconds

## 2014-11-16 LAB — BASIC METABOLIC PANEL
Anion gap: 10 (ref 5–15)
BUN: 16 mg/dL (ref 6–23)
CHLORIDE: 102 mmol/L (ref 96–112)
CO2: 27 mmol/L (ref 19–32)
CREATININE: 1.12 mg/dL (ref 0.50–1.35)
Calcium: 9 mg/dL (ref 8.4–10.5)
GFR calc Af Amer: 68 mL/min — ABNORMAL LOW (ref >90)
GFR calc non Af Amer: 59 mL/min — ABNORMAL LOW (ref >90)
Glucose, Bld: 117 mg/dL — ABNORMAL HIGH (ref 70–99)
Potassium: 3.8 mmol/L (ref 3.5–5.1)
Sodium: 139 mmol/L (ref 135–145)

## 2014-11-16 LAB — HEPARIN LEVEL (UNFRACTIONATED): HEPARIN UNFRACTIONATED: 0.37 [IU]/mL (ref 0.30–0.70)

## 2014-11-16 SURGERY — LEFT HEART CATHETERIZATION WITH CORONARY ANGIOGRAM
Anesthesia: LOCAL

## 2014-11-16 MED ORDER — SODIUM CHLORIDE 0.9 % IV SOLN
0.2500 mg/kg/h | INTRAVENOUS | Status: AC
  Filled 2014-11-16: qty 250

## 2014-11-16 MED ORDER — SODIUM CHLORIDE 0.9 % IV SOLN
INTRAVENOUS | Status: DC
  Administered 2014-11-16: 21:00:00 via INTRAVENOUS

## 2014-11-16 MED ORDER — ASPIRIN EC 81 MG PO TBEC
81.0000 mg | DELAYED_RELEASE_TABLET | Freq: Every day | ORAL | Status: DC
  Administered 2014-11-17: 81 mg via ORAL
  Filled 2014-11-16: qty 1

## 2014-11-16 MED ORDER — FENTANYL CITRATE 0.05 MG/ML IJ SOLN
INTRAMUSCULAR | Status: AC
  Filled 2014-11-16: qty 2

## 2014-11-16 MED ORDER — LIDOCAINE HCL (PF) 1 % IJ SOLN
INTRAMUSCULAR | Status: AC
  Filled 2014-11-16: qty 30

## 2014-11-16 MED ORDER — BIVALIRUDIN BOLUS VIA INFUSION
0.1000 mg/kg | Freq: Once | INTRAVENOUS | Status: DC
  Filled 2014-11-16: qty 11

## 2014-11-16 MED ORDER — ONDANSETRON HCL 4 MG/2ML IJ SOLN
4.0000 mg | Freq: Four times a day (QID) | INTRAMUSCULAR | Status: DC | PRN
  Administered 2014-11-17: 04:00:00 4 mg via INTRAVENOUS
  Filled 2014-11-16: qty 2

## 2014-11-16 MED ORDER — BIVALIRUDIN 250 MG IV SOLR
INTRAVENOUS | Status: AC
  Filled 2014-11-16: qty 250

## 2014-11-16 MED ORDER — ACETAMINOPHEN 325 MG PO TABS: 650.0000 mg | ORAL_TABLET | ORAL | Status: DC | PRN

## 2014-11-16 MED ORDER — MIDAZOLAM HCL 2 MG/2ML IJ SOLN
INTRAMUSCULAR | Status: AC
  Filled 2014-11-16: qty 2

## 2014-11-16 MED ORDER — HEPARIN (PORCINE) IN NACL 2-0.9 UNIT/ML-% IJ SOLN
INTRAMUSCULAR | Status: AC
  Filled 2014-11-16: qty 1500

## 2014-11-16 MED ORDER — TICAGRELOR 90 MG PO TABS
ORAL_TABLET | ORAL | Status: AC
  Filled 2014-11-16: qty 2

## 2014-11-16 MED ORDER — TICAGRELOR 90 MG PO TABS
90.0000 mg | ORAL_TABLET | Freq: Two times a day (BID) | ORAL | Status: DC
  Administered 2014-11-17 (×2): 90 mg via ORAL
  Filled 2014-11-16 (×3): qty 1

## 2014-11-16 MED ORDER — NITROGLYCERIN 1 MG/10 ML FOR IR/CATH LAB
INTRA_ARTERIAL | Status: AC
  Filled 2014-11-16: qty 10

## 2014-11-16 NOTE — Interval H&P Note (Signed)
Cath Lab Visit (complete for each Cath Lab visit)  Clinical Evaluation Leading to the Procedure:   ACS: Yes.    Non-ACS:    Anginal Classification: CCS III  Anti-ischemic medical therapy: Maximal Therapy (2 or more classes of medications)  Non-Invasive Test Results: Intermediate-risk stress test findings: cardiac mortality 1-3%/year  Prior CABG: No previous CABG      History and Physical Interval Note:  11/16/2014 6:05 PM  [name]  has presented today for surgery, with the diagnosis of cp  The various methods of treatment have been discussed with the patient and family. After consideration of risks, benefits and other options for treatment, the patient has consented to  Procedure(s): LEFT HEART CATHETERIZATION WITH CORONARY ANGIOGRAM (N/A) as a surgical intervention .  The patient's history has been reviewed, patient examined, no change in status, stable for surgery.  I have reviewed the patient's chart and labs.  Questions were answered to the patient's satisfaction.     Sudie Bandel A

## 2014-11-16 NOTE — Progress Notes (Signed)
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 Awaiting cath.Rhythm stable sinus bradycardia.  CURRENT MEDS . amLODipine  5 mg Oral Daily  . aspirin EC  81 mg Oral Daily  . fenofibrate  160 mg Oral Daily  . finasteride  5 mg Oral Daily  . fluticasone  2 spray Each Nare Daily  . furosemide  80 mg Intravenous Q12H  . metoprolol  50 mg Oral BID  . potassium chloride SA  20 mEq Oral BID  . sodium chloride  3 mL Intravenous Q12H  . tamsulosin  0.4 mg Oral Daily  . traMADol  50 mg Oral QHS  . vitamin B-12  1,000 mcg Oral Daily    OBJECTIVE  Filed Vitals:   11/16/14 0100 11/16/14 0428 11/16/14 1146 11/16/14 1147  BP: 103/58 128/63 103/58   Pulse: 58 61  62  Temp: 97.7 F (36.5 C) 97.8 F (36.6 C)    TempSrc: Oral Oral    Resp: 18 18    Height:      Weight:  230 lb 14.4 oz (104.736 kg)    SpO2: 94% 97%      Intake/Output Summary (Last 24 hours) at 11/16/14 1442 Last data filed at 11/16/14 1350  Gross per 24 hour  Intake 998.82 ml  Output   1500 ml  Net -501.18 ml   Filed Weights   11/15/14 0721 11/15/14 1909 11/16/14 0428  Weight: 232 lb 3.2 oz (105.325 kg) 232 lb 3.2 oz (105.325 kg) 230 lb 14.4 oz (104.736 kg)    PHYSICAL EXAM  General: Pleasant, NAD. Neuro: Alert and oriented X 3. Moves all extremities spontaneously. Psych: Normal affect. HEENT:  Normal  Neck: Supple without bruits or JVD. Lungs:  Resp regular and unlabored, CTA. Heart: RRR no s3, s4, grade 2/6 systolic murmur at apex and base.. Abdomen: Soft, non-tender, non-distended, BS + x 4.  Extremities:  No clubbing, cyanosis or edema. DP/PT/Radials 2+ and equal bilaterally.  Accessory Clinical Findings  CBC  Recent Labs  11/15/14 0305 11/16/14 0354  WBC 8.0 7.6  HGB 14.0 14.4  HCT 42.6 43.7  MCV 94.5 95.2  PLT 127* 448*   Basic Metabolic Panel  Recent Labs  11/14/14 2117 11/15/14 0305 11/16/14 0354  NA  --  138 139  K  --  3.4* 3.8  CL  --  102 102  CO2  --  28 27  GLUCOSE  --  125* 117*  BUN  --  15 16  CREATININE  --  1.24 1.12  CALCIUM  --  8.7 9.0  MG 2.1  --   --    Liver Function Tests No results for input(s): AST, ALT, ALKPHOS, BILITOT, PROT, ALBUMIN in the last 72 hours. No results for input(s): LIPASE, AMYLASE in the last 72 hours. Cardiac Enzymes  Recent Labs  11/14/14 2117 11/15/14 0305 11/15/14 0830  TROPONINI 0.12* 0.16* 0.15*   BNP Invalid input(s): POCBNP D-Dimer No results for input(s): DDIMER in the last 72 hours. Hemoglobin A1C No results for input(s): HGBA1C in the last  72 hours. Fasting Lipid Panel No results for input(s): CHOL, HDL, LDLCALC, TRIG, CHOLHDL, LDLDIRECT in the last 72 hours. Thyroid Function Tests  Recent Labs  11/14/14 2117  TSH 0.676    TELE  Sinus bradycardia  ECG    Radiology/Studies  Dg Chest 2 View  11/14/2014   CLINICAL DATA:  Congestive heart failure  EXAM: CHEST  2 VIEW  COMPARISON:  Radiograph 06/19/2014  FINDINGS: Stable enlarged cardiac silhouette. Lungs are hyperinflated. There is chronic bronchitic markings centrally. Bilateral small effusions new from prior. Continuous osteophytosis of the thoracic spine.  IMPRESSION: 1. Hyperinflated lungs with chronic bronchitic markings. 2. Small bilateral pleural effusions are new .   Electronically Signed   By: Suzy Bouchard M.D.   On: 11/14/2014 16:21   Nm Myocar Multi W/spect W/wall Motion / Ef  11/15/2014   CLINICAL DATA:  Chest pain  EXAM: Lexiscan Myovue  TECHNIQUE: The patient received IV Lexiscan .4mg  over 15 seconds. 33.0 mCi of Technetium 21m  Sestamibi injected at 30 seconds. Quantitative SPECT images were obtained in the vertical, horizontal and short axis planes after a 45 minute delay. Rest images were obtained with similar planes and delay using 10.2 mCi of Technetium 71m Sestamibi.  FINDINGS: ECG:  No significant ECG changes with Lexiscan infusion.  Quantitiative Gated SPECT EF: 53%, no regional wall motion abnormalities.  Perfusion Images: There was a medium-sized, mild mid to apical anteroseptal and apical lateral perfusion defect on stress images. On rest images, there was a small, mild apical perfusion defect.  IMPRESSION: Intermediate risk study with possible ischemia involving the mid to apical anteroseptal wall and the apical lateral wall.  Dalton Mclean   Electronically Signed   By: [name] M.D.   On: 11/15/2014 21:52    ASSESSMENT AND PLAN   Acute diastolic HF (heart failure) Active Problems:  Hypertension  Metabolic syndrome  Peripheral edema  Elevated troponin  Acute diastolic HF (heart failure), NYHA class 3   Abnormal myoview  Plan:cath today Signed, Darlin Coco MD

## 2014-11-16 NOTE — H&P (View-Only) (Signed)
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 Awaiting cath.Rhythm stable sinus bradycardia.  CURRENT MEDS . amLODipine  5 mg Oral Daily  . aspirin EC  81 mg Oral Daily  . fenofibrate  160 mg Oral Daily  . finasteride  5 mg Oral Daily  . fluticasone  2 spray Each Nare Daily  . furosemide  80 mg Intravenous Q12H  . metoprolol  50 mg Oral BID  . potassium chloride SA  20 mEq Oral BID  . sodium chloride  3 mL Intravenous Q12H  . tamsulosin  0.4 mg Oral Daily  . traMADol  50 mg Oral QHS  . vitamin B-12  1,000 mcg Oral Daily    OBJECTIVE  Filed Vitals:   11/16/14 0100 11/16/14 0428 11/16/14 1146 11/16/14 1147  BP: 103/58 128/63 103/58   Pulse: 58 61  62  Temp: 97.7 F (36.5 C) 97.8 F (36.6 C)    TempSrc: Oral Oral    Resp: 18 18    Height:      Weight:  230 lb 14.4 oz (104.736 kg)    SpO2: 94% 97%      Intake/Output Summary (Last 24 hours) at 11/16/14 1442 Last data filed at 11/16/14 1350  Gross per 24 hour  Intake 998.82 ml  Output   1500 ml  Net -501.18 ml   Filed Weights   11/15/14 0721 11/15/14 1909 11/16/14 0428  Weight: 232 lb 3.2 oz (105.325 kg) 232 lb 3.2 oz (105.325 kg) 230 lb 14.4 oz (104.736 kg)    PHYSICAL EXAM  General: Pleasant, NAD. Neuro: Alert and oriented X 3. Moves all extremities spontaneously. Psych: Normal affect. HEENT:  Normal  Neck: Supple without bruits or JVD. Lungs:  Resp regular and unlabored, CTA. Heart: RRR no s3, s4, grade 2/6 systolic murmur at apex and base.. Abdomen: Soft, non-tender, non-distended, BS + x 4.  Extremities:  No clubbing, cyanosis or edema. DP/PT/Radials 2+ and equal bilaterally.  Accessory Clinical Findings  CBC  Recent Labs  11/15/14 0305 11/16/14 0354  WBC 8.0 7.6  HGB 14.0 14.4  HCT 42.6 43.7  MCV 94.5 95.2  PLT 127* 270*   Basic Metabolic Panel  Recent Labs  11/14/14 2117 11/15/14 0305 11/16/14 0354  NA  --  138 139  K  --  3.4* 3.8  CL  --  102 102  CO2  --  28 27  GLUCOSE  --  125* 117*  BUN  --  15 16  CREATININE  --  1.24 1.12  CALCIUM  --  8.7 9.0  MG 2.1  --   --    Liver Function Tests No results for input(s): AST, ALT, ALKPHOS, BILITOT, PROT, ALBUMIN in the last 72 hours. No results for input(s): LIPASE, AMYLASE in the last 72 hours. Cardiac Enzymes  Recent Labs  11/14/14 2117 11/15/14 0305 11/15/14 0830  TROPONINI 0.12* 0.16* 0.15*   BNP Invalid input(s): POCBNP D-Dimer No results for input(s): DDIMER in the last 72 hours. Hemoglobin A1C No results for input(s): HGBA1C in the last  72 hours. Fasting Lipid Panel No results for input(s): CHOL, HDL, LDLCALC, TRIG, CHOLHDL, LDLDIRECT in the last 72 hours. Thyroid Function Tests  Recent Labs  11/14/14 2117  TSH 0.676    TELE  Sinus bradycardia  ECG    Radiology/Studies  Dg Chest 2 View  11/14/2014   CLINICAL DATA:  Congestive heart failure  EXAM: CHEST  2 VIEW  COMPARISON:  Radiograph 06/19/2014  FINDINGS: Stable enlarged cardiac silhouette. Lungs are hyperinflated. There is chronic bronchitic markings centrally. Bilateral small effusions new from prior. Continuous osteophytosis of the thoracic spine.  IMPRESSION: 1. Hyperinflated lungs with chronic bronchitic markings. 2. Small bilateral pleural effusions are new .   Electronically Signed   By: Suzy Bouchard M.D.   On: 11/14/2014 16:21   Nm Myocar Multi W/spect W/wall Motion / Ef  11/15/2014   CLINICAL DATA:  Chest pain  EXAM: Lexiscan Myovue  TECHNIQUE: The patient received IV Lexiscan .4mg  over 15 seconds. 33.0 mCi of Technetium 34m  Sestamibi injected at 30 seconds. Quantitative SPECT images were obtained in the vertical, horizontal and short axis planes after a 45 minute delay. Rest images were obtained with similar planes and delay using 10.2 mCi of Technetium 104m Sestamibi.  FINDINGS: ECG:  No significant ECG changes with Lexiscan infusion.  Quantitiative Gated SPECT EF: 53%, no regional wall motion abnormalities.  Perfusion Images: There was a medium-sized, mild mid to apical anteroseptal and apical lateral perfusion defect on stress images. On rest images, there was a small, mild apical perfusion defect.  IMPRESSION: Intermediate risk study with possible ischemia involving the mid to apical anteroseptal wall and the apical lateral wall.  Dalton Mclean   Electronically Signed   By: [name] M.D.   On: 11/15/2014 21:52    ASSESSMENT AND PLAN   Acute diastolic HF (heart failure) Active Problems:  Hypertension  Metabolic syndrome  Peripheral edema  Elevated troponin  Acute diastolic HF (heart failure), NYHA class 3   Abnormal myoview  Plan:cath today Signed, Darlin Coco MD

## 2014-11-16 NOTE — Progress Notes (Signed)
ANTICOAGULATION CONSULT NOTE - Follow Up Consult  Pharmacy Consult for heparin Indication: chest pain/ACS  Labs:  Recent Labs  11/14/14 1402  11/14/14 2117 11/15/14 0305 11/15/14 0830 11/16/14 0354  HGB 14.0  --   --  14.0  --  14.4  HCT 42.8  --   --  42.6  --  43.7  PLT 123*  --   --  127*  --  122*  APTT  --   --  101*  --   --   --   LABPROT  --   --  17.2*  --   --   --   INR  --   --  1.39  --   --   --   HEPARINUNFRC  --   --   --  0.39 0.49 0.37  CREATININE 1.15  --   --  1.24  --  1.12  TROPONINI  --   < > 0.12* 0.16* 0.15*  --   < > = values in this interval not displayed.  Assessment/Plan:  81yo male therapeutic on heparin with initial dosing for elevated troponin.  Heparin level therapeutic  Continue heparin drip at 1300 units / hr Follow up after cath  Thank you. Anette Guarneri, PharmD [phone]  11/16/2014,10:01 AM

## 2014-11-16 NOTE — CV Procedure (Signed)
[name] is a 81 y.o. male    [id number]  [id number] LOCATION:  FACILITY: [place]  PHYSICIAN: Troy Sine, MD, [facility] [birth year]   DATE OF PROCEDURE:  11/16/2014    CARDIAC CATHETERIZATION/PERCUTANEOUS CORONARY INTERVENTION     HISTORY:    [name] is a 81 y.o. male   PROCEDURE: Left heart catheterization: coronary angiography, left ventriculography, 2 vessel percutaneous coronary intervention with PTCA/DES stenting of the mid LAD and the proximal RCA.  The patient was brought to the [facility] cardiac catherization laboratory in the fasting state. He  was premedicated with Versed 2 mg and fentanyl 25 g.  His right groin was prepped and shaved in usual sterile fashion. Xylocaine 1% was used for local anesthesia. A 5 French sheath was inserted into the R femoral artery. Diagnostic catheterizatiion was done with 5 [place] FL5, FR4, and pigtail catheters. Left ventriculography was done with 27 cc Omnipaque contrast.  With the demonstration of a 95% focal stenosis in the mid LAD and a 95% proximal RCA stenosis.  The decision was made to proceed with two-vessel percutaneous coronary intervention.  The arterial sheath was upgraded to a 6 [place] sheath.  Angiomax bolus plus infusion was administered.  Brilinta 180 mg was administered orally.  Attention was initially directed at the LAD.  A 6 French XB LAD 4.0 guide was used.  A Prowater wire was advanced down the LAD.  Dilatation was done with a 2.512 mm Euphora balloon.  A Xience Alpine 3.018 mm DES stent was then deployed for 2 inflations at 14 atm.  An [place] Euphora for 3.2512 mm balloon was used for post stent dilatation up to 3.21 mm.  Angiography confirmed an excellent angiographic result with the stenosis being reduced to 0%.  Attention was then directed at the RCA.  A 6 [place] FR4 guide was used for the intervention.  The same Prowater and Euphora balloon were used and predilatation was done at 8 and 10 atm.  A Xience Alpine  3.015 mm DES stent was deployed at 11 and 12 atm.  The same 3.2512 [place] Euphora balloon was used for post stent dilatation at 10 and 11 atm up to 3.18 mm.  Angiography confirmed an excellent angiographic result.  All catheters were removed and the patient.  The arterial sheath was sutured in place.  The patient will be maintained on reduced dose bivalirudin for 2 hours post procedure  HEMODYNAMICS:   Central Aorta: 140/63   Left Ventricle: 140/11/24  ANGIOGRAPHY:  Left main: Angiographically normal vessel which bifurcated into the LAD and left circumflex vessel.   LAD: Large caliber vessel that gave rise to a proximal septal and diagonal branch.  The LAD after the diagonal vessel had a focal 95% stenosis.  The remainder of the LAD was free of significant disease and wrapped around the LV apex.  Left circumflex: Large caliber normal vessel which gave rise to one major obtuse marginal branch.   Right coronary artery: Large caliber dominant vessel that had a focal 95% proximal stenosis after a moderate sized proximal branch.  The vessel ended in the PDA and PLA vessel.   Left ventriculography revealed preserved global LV contractility with an ejection fraction of 55-60%.  There appeared to be 2-3+ angiographic mitral regurgitation.  Percutaneous coronary intervention:  The 95% LAD stenosis was reduced to 0% with insertion of a Xience Alpine 3.018 mm DES stent postdilated to 3.21 mm.  The 95% proximal RCA stenosis was reduced to 0% with  insertion of a Xience Alpine 3.015 mm DES stent postdilated to 3.18 mm.  IMPRESSION:  Normal LV function with an ejection fraction of 55-60% and evidence for 2-3+ angiographic mitral regurgitation.  Two-vessel coronary obstructive disease with focal 95% mid LAD stenosis and focal proximal 95% RCA stenosis and a large dominant RCA and a normal left circumflex coronary artery.  Successful two-vessel percutaneous coronary intervention to the LAD and RCA  with both 95% stenoses being reduced to 0% following insertion of Xience Alpine 3.018 mm DES stent in the LAD and 3.015 mm DES stent in the RCA.  RECOMMENDATION:  The patient will be maintained on dual antiplatelet therapy for minimum of 1 year.  He will be hydrated postprocedure.  Aggressive statin therapy.  Medical therapy for CAD/hypertension.  Troy Sine, MD, [facility] 11/16/2014 7:39 PM

## 2014-11-16 NOTE — Progress Notes (Signed)
Called and spoke with Barbaraann Rondo and [employer] in cath lab, informing them that pt and his wife want to know what time as they had thought he was going at [place] today and was reported by off going nurse  It will be 2pm.. Pt made and wife made aware 2pm not 9am.  Pt and his wife informed that pt will be going at around 530 pm as infomed by above staff in cath lab and not to eat anything as pt has requested.  Verbalized understanding.  Karie Kirks, Therapist, sports.

## 2014-11-17 ENCOUNTER — Other Ambulatory Visit: Payer: Self-pay | Admitting: Physician Assistant

## 2014-11-17 DIAGNOSIS — N183 Chronic kidney disease, stage 3 unspecified: Secondary | ICD-10-CM

## 2014-11-17 LAB — CBC
HCT: 42.9 % (ref 39.0–52.0)
Hemoglobin: 14.4 g/dL (ref 13.0–17.0)
MCH: 31.4 pg (ref 26.0–34.0)
MCHC: 33.6 g/dL (ref 30.0–36.0)
MCV: 93.5 fL (ref 78.0–100.0)
PLATELETS: 120 10*3/uL — AB (ref 150–400)
RBC: 4.59 MIL/uL (ref 4.22–5.81)
RDW: 14.3 % (ref 11.5–15.5)
WBC: 7.6 10*3/uL (ref 4.0–10.5)

## 2014-11-17 LAB — BASIC METABOLIC PANEL
Anion gap: 12 (ref 5–15)
BUN: 16 mg/dL (ref 6–23)
CHLORIDE: 99 mmol/L (ref 96–112)
CO2: 26 mmol/L (ref 19–32)
Calcium: 9.3 mg/dL (ref 8.4–10.5)
Creatinine, Ser: 1.16 mg/dL (ref 0.50–1.35)
GFR calc Af Amer: 65 mL/min — ABNORMAL LOW (ref >90)
GFR calc non Af Amer: 56 mL/min — ABNORMAL LOW (ref >90)
Glucose, Bld: 182 mg/dL — ABNORMAL HIGH (ref 70–99)
Potassium: 3.6 mmol/L (ref 3.5–5.1)
Sodium: 137 mmol/L (ref 135–145)

## 2014-11-17 MED ORDER — TICAGRELOR 90 MG PO TABS: 90.0000 mg | ORAL_TABLET | Freq: Two times a day (BID) | ORAL | Status: DC

## 2014-11-17 MED ORDER — TICAGRELOR 90 MG PO TABS
90.0000 mg | ORAL_TABLET | Freq: Two times a day (BID) | ORAL | Status: DC
Start: 2014-11-17 — End: 2014-11-17

## 2014-11-17 MED ORDER — METOPROLOL TARTRATE 50 MG PO TABS: 50.0000 mg | ORAL_TABLET | Freq: Two times a day (BID) | ORAL | Status: DC

## 2014-11-17 MED ORDER — POTASSIUM CHLORIDE CRYS ER 20 MEQ PO TBCR: 20.0000 meq | EXTENDED_RELEASE_TABLET | Freq: Every day | ORAL | Status: DC

## 2014-11-17 MED ORDER — AMLODIPINE BESYLATE 5 MG PO TABS: 5.0000 mg | ORAL_TABLET | Freq: Every day | ORAL | Status: DC

## 2014-11-17 MED ORDER — FUROSEMIDE 40 MG PO TABS
40.0000 mg | ORAL_TABLET | Freq: Two times a day (BID) | ORAL | Status: DC
  Filled 2014-11-17 (×2): qty 1

## 2014-11-17 MED ORDER — FUROSEMIDE 40 MG PO TABS: 40.0000 mg | ORAL_TABLET | Freq: Two times a day (BID) | ORAL | Status: DC

## 2014-11-17 MED ORDER — ASPIRIN 81 MG PO TBEC: 81.0000 mg | DELAYED_RELEASE_TABLET | Freq: Every day | ORAL | Status: DC

## 2014-11-17 MED FILL — Sodium Chloride IV Soln 0.9%: INTRAVENOUS | Qty: 50 | Status: AC

## 2014-11-17 NOTE — Discharge Summary (Signed)
Physician Discharge Summary    Cardiologist:  Hochrein  Patient ID: ISAIR INABINET MRN: [id number] DOB/AGE: [birth year] 81 y.o.  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 He also has hx of palpitations/SVT and puts his face in a bucket of cold water when they come on. He also has pulmonary HTN and mild chronic edema.   He presented with increasing edema which was not responding with increasing his lasix to 80 mg daily, up from 40 mg. BNP 734 troponin 0.12, new small bil. Pleural effusions. EKG SR without acute changes. No cardiac cath hx and no nuc study found. CKD stage 3 with GFR of 57. He denied chest pain, SOB even with exertion. His wt was up 10 lbs in last few weeks.  He was admitted and underwent diuresis with IV lasix which resulted in net fluid loss of 5.8 L.  He was placed on IV heparin and went for a lexiscan stress test which was intermediate risk with a medium-sized, mild mid to apical anteroseptal and apical lateral perfusion defect on stress images.  He then had a LHC which revealed normal LV function with an ejection fraction of 55-60% and evidence for 2-3+ angiographic mitral regurgitation.  Two-vessel  coronary obstructive disease with focal 95% mid LAD stenosis and focal proximal 95% RCA stenosis and a large dominant RCA and a normal left circumflex coronary artery.  He underwent successful two-vessel percutaneous coronary intervention to the LAD and RCA with both 95% stenoses being reduced to 0% following insertion of Xience Alpine 3.018 mm DES stent in the LAD and 3.015 mm DES stent in the RCA.  SCr remianed stable.   He will be discharged on 56m BID lasix and potassium 20 meq daily.  BMET next week.  Daily weight monitoring discussed.  BMET on Monday.  The patient was seen by Dr. BMare Ferrariwho felt he was stable for DC home.  Consider TEE in the future for a closer look at MR.  Follow up TTE in 3-6 months.    Consults: Cardiac Rehab  Significant Diagnostic Studies:  CARDIAC CATHETERIZATION/PERCUTANEOUS CORONARY INTERVENTION    PROCEDURE: Left heart catheterization: coronary angiography, left ventriculography, 2 vessel percutaneous coronary intervention with PTCA/DES stenting of the mid LAD and the proximal RCA.  [facility]. He was premedicated with Versed 2 mg and fentanyl 25 g. His right groin was prepped and shaved in usual sterile fashion. Xylocaine 1% was used for local anesthesia. A 5 French sheath was inserted into the R femoral artery. Diagnostic catheterizatiion was done with 5 [place], FR4, and pigtail catheters. Left ventriculography was done with 27 cc Omnipaque contrast. With the  demonstration of a 95% focal stenosis in the mid LAD and a 95% proximal RCA stenosis. The decision was made to proceed with two-vessel percutaneous coronary intervention. The arterial sheath was upgraded to a 6 [place] sheath. Angiomax bolus plus infusion was administered. Brilinta 180 mg was administered orally. Attention was initially directed at the LAD. A 6 French XB LAD 4.0 guide was used. A Prowater  wire was advanced down the LAD. Dilatation was done with a 2.512 mm Euphora balloon. A Xience Alpine 3.018 mm DES stent was then deployed for 2 inflations at 14 atm. An [place] Euphora for 3.2512 mm balloon was used for post stent dilatation up to 3.21 mm. Angiography confirmed an excellent angiographic result with the stenosis being reduced to 0%. Attention was then directed at the RCA. A 6 [place] FR4 guide was used for the intervention. The same Prowater and Euphora balloon were used and predilatation was done at 8 and 10 atm. A Xience Alpine 3.015 mm DES stent was deployed at 11 and 12 atm. The same 3.2512 [place] Euphora balloon was used for post stent dilatation at 10 and 11 atm up to 3.18 mm. Angiography confirmed an excellent angiographic result. All catheters were removed and the patient. The arterial sheath was sutured in place. The patient will be maintained on reduced dose bivalirudin for 2 hours post procedure  HEMODYNAMICS:  Central Aorta: 140/63  Left Ventricle: 140/11/24  ANGIOGRAPHY:  Left main: Angiographically normal vessel which bifurcated into the LAD and left circumflex vessel.   LAD: Large caliber vessel that gave rise to a proximal septal and diagonal branch. The LAD after the diagonal vessel had a focal 95% stenosis. The remainder of the LAD was free of significant disease and wrapped around the LV apex.  Left circumflex: Large caliber normal vessel which gave rise to one major obtuse marginal branch.   Right coronary artery: Large caliber dominant vessel that had a focal 95% proximal stenosis after a moderate sized proximal branch. The vessel ended in the PDA and PLA vessel.   Left ventriculography revealed preserved global LV contractility with an ejection fraction of 55-60%. There appeared to be 2-3+ angiographic mitral regurgitation.  Percutaneous coronary intervention:  The 95% LAD stenosis was reduced to 0% with insertion of a Xience Alpine 3.018  mm DES stent postdilated to 3.21 mm. The 95% proximal RCA stenosis was reduced to 0% with insertion of a Xience Alpine 3.015 mm DES stent postdilated to 3.18 mm.  IMPRESSION: Normal LV function with an ejection fraction of 55-60% and evidence for 2-3+ angiographic mitral regurgitation.  Two-vessel coronary obstructive disease with focal 95% mid LAD stenosis and focal proximal 95% RCA stenosis and a large dominant RCA and a normal left circumflex coronary artery.  Successful two-vessel percutaneous coronary intervention to the LAD and RCA with both 95% stenoses being reduced to 0% following insertion of Xience Alpine 3.018 mm DES stent in the LAD and 3.015 mm DES stent in the RCA.  RECOMMENDATION:  The patient will be maintained on dual antiplatelet therapy for minimum of 1 year. He will be hydrated postprocedure. Aggressive statin therapy. Medical therapy for CAD/hypertension.  Troy Sine, MD, [facility] 11/16/2014  Treatments: See above  Discharge Exam: Blood pressure 142/61, pulse 63, temperature 97.4 F (36.3 C), temperature source Oral, resp. rate 18, height 5' 10" (1.778 m), weight 230 lb 13.2 oz (104.7 kg), SpO2 97 %.   Disposition: 01-Home or Self Care      Discharge Instructions  Diet - low sodium heart healthy    Complete by:  As directed      Discharge instructions    Complete by:  As directed   1.  No lifting more than a half gallon of milk or driving for three days. 2.  Monitor your weight every morning.  If you gain 3 pounds in 24 hours, or 5 pounds in a week, call the office for instructions.     Increase activity slowly    Complete by:  As directed             Medication List    STOP taking these medications        metolazone 5 MG tablet  Commonly known as:  ZAROXOLYN     ondansetron 8 MG disintegrating tablet  Commonly known as:  ZOFRAN ODT     sildenafil 100 MG tablet  Commonly known as:  VIAGRA      TAKE these medications         amLODipine 5 MG tablet  Commonly known as:  NORVASC  Take 1 tablet (5 mg total) by mouth daily.     aspirin 81 MG EC tablet  Take 1 tablet (81 mg total) by mouth daily.     diclofenac sodium 1 % Gel  Commonly known as:  VOLTAREN  Apply 4 g topically 3 (three) times daily as needed (pain).     fenofibrate 145 MG tablet  Commonly known as:  TRICOR  Take 145 mg by mouth daily.     finasteride 5 MG tablet  Commonly known as:  PROSCAR  Take 1 tablet (5 mg total) by mouth daily.     fluticasone 50 MCG/ACT nasal spray  Commonly known as:  FLONASE  USE TWO SPRAYS IN EACH NOSTRIL DAILY     furosemide 40 MG tablet  Commonly known as:  LASIX  Take 1 tablet (40 mg total) by mouth 2 (two) times daily.     metoprolol 50 MG tablet  Commonly known as:  LOPRESSOR  Take 1 tablet (50 mg total) by mouth 2 (two) times daily.     MUSCLE RUB 10-15 % Crea  Apply 1 application topically 2 (two) times daily.     potassium chloride SA 20 MEQ tablet  Commonly known as:  K-DUR,KLOR-CON  Take 1 tablet (20 mEq total) by mouth daily.     tamsulosin 0.4 MG Caps capsule  Commonly known as:  FLOMAX  Take 0.4 mg by mouth daily.     ticagrelor 90 MG Tabs tablet  Commonly known as:  BRILINTA  Take 1 tablet (90 mg total) by mouth 2 (two) times daily.     traMADol 50 MG tablet  Commonly known as:  ULTRAM  TAKE ONE TABLET EVERY SIX HOURS AS NEEDED FOR PAIN. TAKE WITH FOOD     vitamin B-12 1000 MCG tablet  Commonly known as:  CYANOCOBALAMIN  Take 1,000 mcg by mouth daily.       Follow-up Information    Follow up with [facility] R, NP On 12/12/2014.   Specialty:  Cardiology   Why:  8:30 AM   Contact information:   [place] [address] [phone]       Follow up with Labs On 11/21/2014.   Why:  Go to our office on [address] and have labs drawn.   Contact information:   [address] [place] [address] [phone]     Greater than 30  minutes  was spent completing the patient's discharge.    SignedTarri Fuller, [place] 11/17/2014, 9:27 AM

## 2014-11-17 NOTE — Progress Notes (Signed)
CARDIAC REHAB PHASE I   PRE:  Rate/Rhythm: 81 SR  BP:  Supine:   Sitting: 121/54  Standing:    SaO2:   MODE:  Ambulation: 400 ft   POST:  Rate/Rhythm: 87 SR  BP:  Supine:   Sitting: 160/69  Standing:    SaO2:  0840-0934 Pt walked 400 ft with cane and minimal asst. Tolerated well. Denied CP. Pt has CHF booklet and weighs daily. Stated his scales have questions he has to answer that relays to doctor that he got from insurance company. Stated watches sodium and he stated already in CRP in [place]. Stated he walks several miles a day. Gave ex ed to get back to walking. Gave brilinta booklet and stent card. Stressed importance of brilinta.   Fred Good, RN BSN  11/17/2014 9:29 AM

## 2014-11-17 NOTE — Progress Notes (Signed)
Site area: right groin  Site Prior to Removal:  Level 0  Pressure Applied For 20 MINUTES    Minutes Beginning at 23:35  Manual:   Yes.    Patient Status During Pull:  Alert oriented X 4  Post Pull Groin Site:  Level 0  Post Pull Instructions Given:  Yes.    Post Pull Pulses Present:  Yes.    Dressing Applied:  Yes.    Comments:  Pt tolerated sheath removal without complication, will continue to monitor patient.

## 2014-11-17 NOTE — Progress Notes (Signed)
Subjective: Did not sleep well.  No SOB/CP  Objective: Vital signs in last 24 hours: Temp:  [97.3 F (36.3 C)-98 F (36.7 C)] 97.8 F (36.6 C) (04/14 0528) Pulse Rate:  [58-79] 59 (04/14 0528) Resp:  [14-20] 20 (04/14 0528) BP: (103-144)/(49-75) 137/58 mmHg (04/14 0528) SpO2:  [94 %-97 %] 97 % (04/14 0528) Weight:  [230 lb 13.2 oz (104.7 kg)] 230 lb 13.2 oz (104.7 kg) (04/14 0206) Last BM Date: 11/14/14  Intake/Output from previous day: 04/13 0701 - 04/14 0700 In: 240 [P.O.:240] Out: 3625 [Urine:3625] Intake/Output this shift:    Medications Current Facility-Administered Medications  Medication Dose Route Frequency Provider Last Rate Last Dose  . 0.9 %  sodium chloride infusion  250 mL Intravenous PRN Isaiah Serge, NP 10 mL/hr at 11/14/14 1858 250 mL at 11/14/14 1858  . 0.9 %  sodium chloride infusion   Intravenous Continuous Troy Sine, MD 150 mL/hr at 11/16/14 2030    . acetaminophen (TYLENOL) tablet 650 mg  650 mg Oral Q4H PRN Troy Sine, MD      . ALPRAZolam Duanne Moron) tablet 0.25 mg  0.25 mg Oral BID PRN Isaiah Serge, NP      . amLODipine (NORVASC) tablet 5 mg  5 mg Oral Daily Isaiah Serge, NP   5 mg at 11/16/14 1146  . aspirin EC tablet 81 mg  81 mg Oral Daily Troy Sine, MD      . bivalirudin [facility]) LOAD via infusion 10.5 mg  0.1 mg/kg Intravenous Once Josue Hector, MD      . diclofenac sodium (VOLTAREN) 1 % transdermal gel 4 g  4 g Topical TID PRN Isaiah Serge, NP   4 g at 11/14/14 2139  . fenofibrate tablet 160 mg  160 mg Oral Daily Isaiah Serge, NP   160 mg at 11/16/14 1146  . finasteride (PROSCAR) tablet 5 mg  5 mg Oral Daily Isaiah Serge, NP   5 mg at 11/16/14 1146  . fluticasone (FLONASE) 50 MCG/ACT nasal spray 2 spray  2 spray Each Nare Daily Isaiah Serge, NP   2 spray at 11/16/14 1148  . furosemide (LASIX) injection 80 mg  80 mg Intravenous Q12H Isaiah Serge, NP   80 mg at 11/17/14 0011  . metoprolol (LOPRESSOR) tablet 50 mg   50 mg Oral BID Isaiah Serge, NP   50 mg at 11/17/14 0011  . ondansetron (ZOFRAN) injection 4 mg  4 mg Intravenous Q6H PRN Troy Sine, MD   4 mg at 11/17/14 0426  . potassium chloride SA (K-DUR,KLOR-CON) CR tablet 20 mEq  20 mEq Oral BID Isaiah Serge, NP   20 mEq at 11/17/14 0011  . sodium chloride 0.9 % injection 3 mL  3 mL Intravenous Q12H Isaiah Serge, NP   3 mL at 11/16/14 1148  . sodium chloride 0.9 % injection 3 mL  3 mL Intravenous PRN Isaiah Serge, NP      . tamsulosin [facility]) capsule 0.4 mg  0.4 mg Oral Daily Isaiah Serge, NP   0.4 mg at 11/16/14 1147  . ticagrelor (BRILINTA) tablet 90 mg  90 mg Oral BID Troy Sine, MD   90 mg at 11/17/14 0612  . traMADol (ULTRAM) tablet 50 mg  50 mg Oral QHS Isaiah Serge, NP   50 mg at 11/17/14 0011  . vitamin B-12 (CYANOCOBALAMIN) tablet 1,000 mcg  1,000 mcg Oral Daily  Isaiah Serge, NP   1,000 mcg at 11/16/14 1146    PE: General appearance: alert, cooperative and no distress Lungs: clear to auscultation bilaterally Heart: regular rate and rhythm and 2/6 sys MM axillae Extremities: No LEE Pulses: 2+ and symmetric Skin: Warm and dry.  Right groin:  nontender, no hematoma, ecchymosis Neurologic: Grossly normal  Lab Results:   Recent Labs  11/15/14 0305 11/16/14 0354 11/17/14 0315  WBC 8.0 7.6 7.6  HGB 14.0 14.4 14.4  HCT 42.6 43.7 42.9  PLT 127* 122* 120*   BMET  Recent Labs  11/15/14 0305 11/16/14 0354 11/17/14 0315  NA 138 139 137  K 3.4* 3.8 3.6  CL 102 102 99  CO2 28 27 26   GLUCOSE 125* 117* 182*  BUN 15 16 16   CREATININE 1.24 1.12 1.16  CALCIUM 8.7 9.0 9.3   PT/INR  Recent Labs  11/14/14 2117  LABPROT 17.2*  INR 1.39   Lipid Panel 05/19/14    Component Value Date/Time   CHOL 124 05/19/2014 1051   TRIG 212* 05/19/2014 1051   HDL 33* 05/19/2014 1051   CHOLHDL 3.8 05/19/2014 1051   VLDL 42* 05/19/2014 1051   LDLCALC 49 05/19/2014 1051      Assessment/Plan  Principal Problem:    Acute diastolic HF (heart failure) Active Problems:   Mitral valve regurgitation, moderate to severe   Hypertension   Metabolic syndrome   Pulmonary HTN   Peripheral edema   Elevated troponin   History of PSVT (paroxysmal supraventricular tachycardia)   Chronic renal disease, stage III   Dyslipidemia   History of prostate cancer   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  Two-vessel coronary obstructive disease with focal 95% mid LAD stenosis and focal proximal 95% RCA stenosis and a large dominant RCA and a normal left circumflex coronary artery.  He underwent successful two-vessel percutaneous coronary intervention to the LAD and RCA with both 95% stenoses being reduced to 0% following insertion of Xience Alpine 3.018 mm DES stent in the LAD and 3.015 mm DES stent in the RCA.  ASA, Brilinta, lopressor 50, fenofibrate.  Not on statin.  LDL 49 in Oct 2015. Amlodipine 5.  NSVT:  Short run(5-6 beat).  On lopressor.   Net fluids: -3.4L/-7.4L.   He looks euvolemic. Laying flat.  Stop IV lasix.  He was on PRN lasix and daily metolazone at home?    SCr WNL.  I think his MR is probably the biggest contributor at this point.  Will do 40mg  BID PO.  20 meq K daily.  Bmet on Monday.   MR, mod-severe:  Continue to monitor.  Consider TEE for a closer look, however, he is now on brilinta which can not be stopped.  Follow up TTE.   Ambulate with cardiac rehab this morning.  Possible DC home today.    LOS: 3 days    HAGER, BRYAN PA-C 11/17/2014 7:13 AM  Agree with above assessment. The mitral valve regurgitation may improve somewhat now that his ischemic burden has been improved with 2 stents. Consider outpatient TEE later depending on clinical progress. Anticipate home today.

## 2014-11-21 ENCOUNTER — Other Ambulatory Visit (INDEPENDENT_AMBULATORY_CARE_PROVIDER_SITE_OTHER): Payer: Medicare Other | Admitting: *Deleted

## 2014-11-21 DIAGNOSIS — N183 Chronic kidney disease, stage 3 unspecified: Secondary | ICD-10-CM

## 2014-11-21 LAB — BASIC METABOLIC PANEL
BUN: 21 mg/dL (ref 6–23)
CO2: 27 meq/L (ref 19–32)
CREATININE: 1.27 mg/dL (ref 0.40–1.50)
Calcium: 9.4 mg/dL (ref 8.4–10.5)
Chloride: 104 mEq/L (ref 96–112)
GFR: 57.49 mL/min — ABNORMAL LOW (ref >60.00)
GLUCOSE: 200 mg/dL — AB (ref 70–99)
Potassium: 3.7 mEq/L (ref 3.5–5.1)
Sodium: 137 mEq/L (ref 135–145)

## 2014-11-21 NOTE — Addendum Note (Signed)
Addended by: Eulis Foster on: 11/21/2014 11:46 AM   Modules accepted: Orders

## 2014-11-23 ENCOUNTER — Telehealth: Payer: Self-pay | Admitting: Cardiology

## 2014-11-23 NOTE — Telephone Encounter (Signed)
LMTCB

## 2014-11-23 NOTE — Telephone Encounter (Signed)
Spoke with Inez Catalina, patient's wife. Patient is scheduled for cataract surgery on Monday with Dr. Talbert Forest.  Patient was told he CAN TAKE brilinta, aspirin, amlodipine, metoprolol.  Patient's wife wants to know if this procedure is OK, since he just had stents.   Will defer to Dr. Percival Spanish to advise

## 2014-11-23 NOTE — Telephone Encounter (Signed)
This is a low risk procedure.  However, I would hold off on even this until we have seen him in follow up.  I suggest that he cancel that procedure for now.

## 2014-11-23 NOTE — Telephone Encounter (Signed)
Spoke with patient's wife and communicated info regarding cancelling procedure until after follow up on 5/10. She voiced understanding

## 2014-11-23 NOTE — Telephone Encounter (Signed)
Pt had stents put in last week. He is scheduled to have eye surgery pn Monday,will this be all right?Could you please let her know before 2 today.Marland Kitchen

## 2014-11-29 ENCOUNTER — Telehealth: Payer: Self-pay | Admitting: Cardiology

## 2014-11-29 ENCOUNTER — Telehealth: Payer: Self-pay | Admitting: Family Medicine

## 2014-11-29 NOTE — Telephone Encounter (Signed)
No answer when called 

## 2014-11-29 NOTE — Telephone Encounter (Signed)
Pt's wife called in stating that there was a prescription that the pt was on when he was discharged from the hospital the helped with the swelling in his legs and he would like to get that refilled. She stated that it was NOT the Furosemide. Please call back   Thanks

## 2014-11-29 NOTE — Telephone Encounter (Signed)
Pt called and said he was out of his medication for leg swelling.  I looked at med list and asked him if he was taking his Furosemide.  The patient was confused.  Could not tell if had or not, but was for leg swelling.  I see Furosemide was refilled on 4/14 and I called pharmacy and was told pt did pick it up.  I called patient home again and spoke to wife.  She says he has his Furosemide but had an additional pill that said "for leg swelling"  I looked at discharge summary from hospital (just discharged on 4/14)  Zaroxolyn was discontinued.  Pt C/O a lot of leg swelling esp in right leg.  I asked pt if he was monitoring his weight daily as instructed.  Wife said yes but was not writing it down.  Does know up 1 lb from yesterday.(230 - 231)  Has some phone communication device in house to relay info everyday but not working (someone suppose to fixing today)  Reinforced the importance of daily weights and writing down results.  Stressed the importance of knowing if pt has gained weight per discharge instructions (3lb/day or 5lb/week)  Best advise for them is to call cardiology office today and make them aware of what is going on and let them decide what he needs to do.

## 2014-11-30 NOTE — Telephone Encounter (Signed)
Talked to wife of this pt. And told that the Dr. Would resolve this issue on the 10th when the pt. Comes in for a office visit.

## 2014-12-02 ENCOUNTER — Ambulatory Visit (INDEPENDENT_AMBULATORY_CARE_PROVIDER_SITE_OTHER): Payer: Medicare Other | Admitting: Family Medicine

## 2014-12-02 ENCOUNTER — Encounter: Payer: Self-pay | Admitting: Family Medicine

## 2014-12-02 VITALS — BP 110/60 | HR 78 | Temp 98.0°F | Resp 18 | Ht 67.0 in | Wt 242.0 lb

## 2014-12-02 DIAGNOSIS — I5021 Acute systolic (congestive) heart failure: Secondary | ICD-10-CM

## 2014-12-02 DIAGNOSIS — R609 Edema, unspecified: Secondary | ICD-10-CM

## 2014-12-02 DIAGNOSIS — Z09 Encounter for follow-up examination after completed treatment for conditions other than malignant neoplasm: Secondary | ICD-10-CM

## 2014-12-02 MED ORDER — FUROSEMIDE 40 MG PO TABS: 80.0000 mg | ORAL_TABLET | Freq: Two times a day (BID) | ORAL | Status: DC

## 2014-12-02 NOTE — Progress Notes (Signed)
Subjective:    Patient ID: [name], male    DOB: 04-23-1929, 81 y.o.   MRN: [id number]  HPI  Patient was recently admitted to the hospital. He was found to have ischemic heart disease and underwent percutaneous stenting of 2 vessels. He was discharged home on brilinta and metoprolol.  He was also discharged home on Lasix 40 mg by mouth twice a day and potassium chloride 20 mEq by mouth daily. His discharge weight from the hospital was 230 pounds. Today he has +2 pitting edema in both legs. His weight has increased to 242 pounds although this is with clothing on. He also has bibasilar crackles and increasing shortness of breath. He denies any chest pain. He denies any angina. He does have some mild dyspnea on exertion. Patient's history was difficult to obtain. I am uncertain that he is also taking furosemide however he is adamant that he is taking what is on his list. Past Medical History  Diagnosis Date  . Hypertension   . OA (osteoarthritis)     rt leg  . ED (erectile dysfunction)   . Chronic fatigue   . Gout   . Bowing of leg   . Spinal stenosis   . Prostate cancer   . CHF (congestive heart failure)   . Elevated troponin 11/14/2014   Past Surgical History  Procedure Laterality Date  . Back surgery x 2    . Rt rotator cuff repair    . Cervical spine surgery    . Back surgery    . Left heart catheterization with coronary angiogram N/A 11/16/2014    Procedure: LEFT HEART CATHETERIZATION WITH CORONARY ANGIOGRAM;  Surgeon: Troy Sine, MD;  Location: [facility] CATH LAB;  Service: Cardiovascular;  Laterality: N/A;   Current Outpatient Prescriptions on File Prior to Visit  Medication Sig Dispense Refill  . amLODipine (NORVASC) 5 MG tablet Take 1 tablet (5 mg total) by mouth daily. 30 tablet 11  . aspirin EC 81 MG EC tablet Take 1 tablet (81 mg total) by mouth daily.    . diclofenac sodium (VOLTAREN) 1 % GEL Apply 4 g topically 3 (three) times daily as needed (pain).     .  fenofibrate (TRICOR) 145 MG tablet Take 145 mg by mouth daily.    . finasteride (PROSCAR) 5 MG tablet Take 1 tablet (5 mg total) by mouth daily. 30 tablet 11  . fluticasone (FLONASE) 50 MCG/ACT nasal spray USE TWO SPRAYS IN EACH NOSTRIL DAILY 16 g 11  . furosemide (LASIX) 40 MG tablet Take 1 tablet (40 mg total) by mouth 2 (two) times daily. 60 tablet 5  . Menthol-Methyl Salicylate (MUSCLE RUB) 10-15 % CREA Apply 1 application topically 2 (two) times daily.     . metoprolol tartrate (LOPRESSOR) 50 MG tablet Take 1 tablet (50 mg total) by mouth 2 (two) times daily. 60 tablet 5  . potassium chloride SA (K-DUR,KLOR-CON) 20 MEQ tablet Take 1 tablet (20 mEq total) by mouth daily. 30 tablet 5  . tamsulosin (FLOMAX) 0.4 MG CAPS capsule Take 0.4 mg by mouth daily.     . ticagrelor (BRILINTA) 90 MG TABS tablet Take 1 tablet (90 mg total) by mouth 2 (two) times daily. 180 tablet 3  . traMADol (ULTRAM) 50 MG tablet TAKE ONE TABLET EVERY SIX HOURS AS NEEDED FOR PAIN. TAKE WITH FOOD 40 tablet 0  . vitamin B-12 (CYANOCOBALAMIN) 1000 MCG tablet Take 1,000 mcg by mouth daily.     No current facility-administered  medications on file prior to visit.   No Known Allergies History   Social History  . Marital Status: Married    Spouse Name: N/A  . Number of Children: 4  . Years of Education: N/A   Occupational History  .     Social History Main Topics  . Smoking status: Former Research scientist (life sciences)  . Smokeless tobacco: Never Used     Comment: QUIT SMOKING  MANY YEARS AGO"  . Alcohol Use: No  . Drug Use: No  . Sexual Activity: Not on file   Other Topics Concern  . Not on file   Social History Narrative   Four adopted children.  Lives alone.      Review of Systems  All other systems reviewed and are negative.      Objective:   Physical Exam  Constitutional: He appears well-developed and well-nourished.  Neck: Neck supple. No JVD present.  Cardiovascular: Normal rate and regular rhythm.   Murmur  heard. Pulmonary/Chest: Effort normal. He has rales.  Abdominal: Soft. Bowel sounds are normal.  Musculoskeletal: He exhibits edema.  Vitals reviewed.         Assessment & Plan:  Edema - Plan: furosemide (LASIX) 40 MG tablet  Hospital discharge follow-up  Acute systolic congestive heart failure  I reviewed his discharge summary from the hospital. I will increase his Lasix to 80 mg by mouth twice a day. I will recheck the patient Monday of next week and I want him to increase his potassium chloride to 20 mEq by mouth twice a day. Next week we'll need to recheck a BMP to monitor his renal function and his potassium. The patient is fluid overloaded today on examination. I have asked the patient to go the emergency room if worse.

## 2014-12-05 ENCOUNTER — Encounter: Payer: Self-pay | Admitting: Family Medicine

## 2014-12-07 ENCOUNTER — Other Ambulatory Visit: Payer: Self-pay | Admitting: Family Medicine

## 2014-12-07 MED ORDER — FLUTICASONE PROPIONATE 50 MCG/ACT NA SUSP: 2.0000 | Freq: Every day | NASAL | Status: DC

## 2014-12-09 ENCOUNTER — Ambulatory Visit (INDEPENDENT_AMBULATORY_CARE_PROVIDER_SITE_OTHER): Payer: Medicare Other | Admitting: Family Medicine

## 2014-12-09 ENCOUNTER — Encounter: Payer: Self-pay | Admitting: Family Medicine

## 2014-12-09 VITALS — BP 120/60 | HR 68 | Temp 97.9°F | Resp 18 | Ht 67.0 in | Wt 237.0 lb

## 2014-12-09 DIAGNOSIS — R609 Edema, unspecified: Secondary | ICD-10-CM

## 2014-12-09 NOTE — Addendum Note (Signed)
Addended by: Shary Decamp B on: 12/09/2014 02:57 PM   Modules accepted: Orders, Medications

## 2014-12-09 NOTE — Progress Notes (Signed)
Subjective:    Patient ID: [name], male    DOB: Aug 01, 1929, 81 y.o.   MRN: [id number]  HPI  12/02/14 Patient was recently admitted to the hospital. He was found to have ischemic heart disease and underwent percutaneous stenting of 2 vessels. He was discharged home on brilinta and metoprolol.  He was also discharged home on Lasix 40 mg by mouth twice a day and potassium chloride 20 mEq by mouth daily. His discharge weight from the hospital was 230 pounds. Today he has +2 pitting edema in both legs. His weight has increased to 242 pounds although this is with clothing on. He also has bibasilar crackles and increasing shortness of breath. He denies any chest pain. He denies any angina. He does have some mild dyspnea on exertion. Patient's history was difficult to obtain. I am uncertain that he is also taking furosemide however he is adamant that he is taking what is on his list.  AT that time, my plan was: I reviewed his discharge summary from the hospital. I will increase his Lasix to 80 mg by mouth twice a day. I will recheck the patient Monday of next week and I want him to increase his potassium chloride to 20 mEq by mouth twice a day. Next week we'll need to recheck a BMP to monitor his renal function and his potassium. The patient is fluid overloaded today on examination. I have asked the patient to go the emergency room if worse.  12/09/14 Patient did not follow-up until today. His weight has decreased from 242 pounds to 237 pounds which is much better. Patient does feel better however he still has faint bibasilar crackles. The edema in his legs is much better. He denies any chest pain although he does continue have some mild dyspnea on exertion. Past Medical History  Diagnosis Date  . Hypertension   . OA (osteoarthritis)     rt leg  . ED (erectile dysfunction)   . Chronic fatigue   . Gout   . Bowing of leg   . Spinal stenosis   . Prostate cancer   . CHF (congestive heart failure)    . Elevated troponin 11/14/2014   Past Surgical History  Procedure Laterality Date  . Back surgery x 2    . Rt rotator cuff repair    . Cervical spine surgery    . Back surgery    . Left heart catheterization with coronary angiogram N/A 11/16/2014    Procedure: LEFT HEART CATHETERIZATION WITH CORONARY ANGIOGRAM;  Surgeon: Troy Sine, MD;  Location: [facility] CATH LAB;  Service: Cardiovascular;  Laterality: N/A;   Current Outpatient Prescriptions on File Prior to Visit  Medication Sig Dispense Refill  . amLODipine (NORVASC) 5 MG tablet Take 1 tablet (5 mg total) by mouth daily. 30 tablet 11  . aspirin EC 81 MG EC tablet Take 1 tablet (81 mg total) by mouth daily.    . diclofenac sodium (VOLTAREN) 1 % GEL Apply 4 g topically 3 (three) times daily as needed (pain).     . fenofibrate (TRICOR) 145 MG tablet Take 145 mg by mouth daily.    . finasteride (PROSCAR) 5 MG tablet Take 1 tablet (5 mg total) by mouth daily. 30 tablet 11  . fluticasone (FLONASE) 50 MCG/ACT nasal spray Place 2 sprays into both nostrils daily. 16 g 11  . furosemide (LASIX) 40 MG tablet Take 1 tablet (40 mg total) by mouth 2 (two) times daily. 60 tablet 5  .  furosemide (LASIX) 40 MG tablet Take 2 tablets (80 mg total) by mouth 2 (two) times daily. 30 tablet 0  . Menthol-Methyl Salicylate (MUSCLE RUB) 10-15 % CREA Apply 1 application topically 2 (two) times daily.     . metoprolol tartrate (LOPRESSOR) 50 MG tablet Take 1 tablet (50 mg total) by mouth 2 (two) times daily. 60 tablet 5  . potassium chloride SA (K-DUR,KLOR-CON) 20 MEQ tablet Take 1 tablet (20 mEq total) by mouth daily. 30 tablet 5  . tamsulosin (FLOMAX) 0.4 MG CAPS capsule Take 0.4 mg by mouth daily.     . ticagrelor (BRILINTA) 90 MG TABS tablet Take 1 tablet (90 mg total) by mouth 2 (two) times daily. 180 tablet 3  . traMADol (ULTRAM) 50 MG tablet TAKE ONE TABLET EVERY SIX HOURS AS NEEDED FOR PAIN. TAKE WITH FOOD 40 tablet 0  . vitamin B-12 (CYANOCOBALAMIN) 1000  MCG tablet Take 1,000 mcg by mouth daily.     No current facility-administered medications on file prior to visit.   No Known Allergies History   Social History  . Marital Status: Married    Spouse Name: N/A  . Number of Children: 4  . Years of Education: N/A   Occupational History  .     Social History Main Topics  . Smoking status: Former Research scientist (life sciences)  . Smokeless tobacco: Never Used     Comment: QUIT SMOKING  MANY YEARS AGO"  . Alcohol Use: No  . Drug Use: No  . Sexual Activity: Not on file   Other Topics Concern  . Not on file   Social History Narrative   Four adopted children.  Lives alone.      Review of Systems  All other systems reviewed and are negative.      Objective:   Physical Exam  Constitutional: He appears well-developed and well-nourished.  Neck: Neck supple. No JVD present.  Cardiovascular: Normal rate and regular rhythm.   Murmur heard. Pulmonary/Chest: Effort normal. He has rales.  Abdominal: Soft. Bowel sounds are normal.  Musculoskeletal: He exhibits edema.  Vitals reviewed.         Assessment & Plan:   Edema - Plan: BASIC METABOLIC PANEL WITH GFR  At the present time I would like to continue him on the higher dose of diuretic. His weight is still elevated and he does continue to have bibasilar rales. His shortness of breath although better is not completely better. Patient has an appointment to see his cardiologist on Tuesday. I have asked the patient to continue Lasix 80 mg by mouth twice a day until seen by his cardiologist on Tuesday. Hopefully at that time he will be back to his dry weight and we can reduce his diuretic back to his baseline 40 mg by mouth twice a day. At the present time I think he needs to continue with further diuresis to avoid hospitalization. I will recheck a BMP today to monitor his potassium as well as his renal function. If there is evidence of prerenal azotemia we may need to decrease his Lasix sooner than I  plan.

## 2014-12-10 LAB — BASIC METABOLIC PANEL WITH GFR
BUN: 16 mg/dL (ref 6–23)
CALCIUM: 9.4 mg/dL (ref 8.4–10.5)
CO2: 26 mEq/L (ref 19–32)
CREATININE: 1.29 mg/dL (ref 0.50–1.35)
Chloride: 101 mEq/L (ref 96–112)
GFR, EST AFRICAN AMERICAN: 59 mL/min — AB
GFR, EST NON AFRICAN AMERICAN: 51 mL/min — AB
Glucose, Bld: 143 mg/dL — ABNORMAL HIGH (ref 70–99)
POTASSIUM: 3.7 meq/L (ref 3.5–5.3)
Sodium: 137 mEq/L (ref 135–145)

## 2014-12-12 ENCOUNTER — Ambulatory Visit: Payer: Medicare Other | Admitting: Cardiology

## 2014-12-13 ENCOUNTER — Ambulatory Visit (INDEPENDENT_AMBULATORY_CARE_PROVIDER_SITE_OTHER): Payer: Medicare Other | Admitting: Cardiology

## 2014-12-13 ENCOUNTER — Encounter: Payer: Self-pay | Admitting: Cardiology

## 2014-12-13 VITALS — BP 126/74 | HR 63 | Ht 69.0 in | Wt 232.4 lb

## 2014-12-13 DIAGNOSIS — N183 Chronic kidney disease, stage 3 unspecified: Secondary | ICD-10-CM

## 2014-12-13 DIAGNOSIS — I2510 Atherosclerotic heart disease of native coronary artery without angina pectoris: Secondary | ICD-10-CM

## 2014-12-13 DIAGNOSIS — I214 Non-ST elevation (NSTEMI) myocardial infarction: Secondary | ICD-10-CM | POA: Diagnosis not present

## 2014-12-13 DIAGNOSIS — I5031 Acute diastolic (congestive) heart failure: Secondary | ICD-10-CM

## 2014-12-13 DIAGNOSIS — E785 Hyperlipidemia, unspecified: Secondary | ICD-10-CM | POA: Diagnosis not present

## 2014-12-13 DIAGNOSIS — I10 Essential (primary) hypertension: Secondary | ICD-10-CM

## 2014-12-13 NOTE — Patient Instructions (Signed)
Your physician recommends that you schedule a follow-up appointment in: 6-8 Weeks with Dr Percival Spanish

## 2014-12-13 NOTE — Progress Notes (Signed)
Cardiology Office Note   Date:  12/13/2014   ID:  [name], DOB 1929/03/23, MRN [id number]  PCP:  [name], MD  Cardiologist:  Dr. Percival Spanish    Chief Complaint  Patient presents with  . Hospitalization Follow-up    Cath, 2 stents placed      History of Present Illness: [name] is a 81 y.o. male who presents for hospitalizations follow up of 2 stents, one to RCA and one to LAD.  Echo with hospitalization PA pk pressure 57 mmHg,   Left ventricle: The cavity size was normal. Wall thickness was increased in a pattern of severe LVH. Systolic function was normal. The estimated ejection fraction was in the range of 55% to 60%. - Aortic valve: There was mild regurgitation. - Mitral valve: Similar to 2014 there is a clacified mobile mass on anterior leaflet I think this is a chronic flail segment with at least moderate MR Suggest inpateint TEE. There was moderate regurgitation.  Pt discharged with Brilinta.    He has a hx of G2 diastolic HF and echo with an echogenic mobile mass on the anterior leaflet of the mitral valve with mild regurgitation/ Pt has had follow up echoes ordered but not done. He also has hx of palpitations/SVT and puts his face in a bucket of cold water when they come on. Also with pulmonary HTN and mild chronic edema. He was admitted 11/14/14 with increasing edema not responding to diuretic.  He had no chest pain but wt up 10 lbs.   He responded well to lasix in ER.  He had myoview that was positive with intermediate risk.  It was decided to proceed with cardiac cath. Cath results as above.    Since discharge he has done well without complaints, no chest pain and rare SOB. He has seen his PCP and had Bmet repeated and labs are stable.  He is eating healthy, and exercising by walking.      Past Medical History  Diagnosis Date  . Hypertension   . OA (osteoarthritis)     rt leg  . ED (erectile dysfunction)   . Chronic fatigue    . Gout   . Bowing of leg   . Spinal stenosis   . Prostate cancer   . CHF (congestive heart failure)   . Elevated troponin 11/14/2014    Past Surgical History  Procedure Laterality Date  . Back surgery x 2    . Rt rotator cuff repair    . Cervical spine surgery    . Back surgery    . Left heart catheterization with coronary angiogram N/A 11/16/2014    Procedure: LEFT HEART CATHETERIZATION WITH CORONARY ANGIOGRAM;  Surgeon: Fred Sine, MD;  Location: [facility] CATH LAB;  Service: Cardiovascular;  Laterality: N/A;     Current Outpatient Prescriptions  Medication Sig Dispense Refill  . amLODipine (NORVASC) 5 MG tablet Take 1 tablet (5 mg total) by mouth daily. 30 tablet 11  . aspirin EC 81 MG EC tablet Take 1 tablet (81 mg total) by mouth daily.    . diclofenac sodium (VOLTAREN) 1 % GEL Apply 4 g topically 3 (three) times daily as needed (pain).     . finasteride (PROSCAR) 5 MG tablet Take 1 tablet (5 mg total) by mouth daily. 30 tablet 11  . fluticasone (FLONASE) 50 MCG/ACT nasal spray Place 2 sprays into both nostrils daily. 16 g 11  . furosemide (LASIX) 40 MG tablet Take 1 tablet (40  mg total) by mouth 2 (two) times daily. (Patient taking differently: Take 80 mg by mouth 2 (two) times daily. ) 60 tablet 5  . Menthol-Methyl Salicylate (MUSCLE RUB) 10-15 % CREA Apply 1 application topically 2 (two) times daily.     . metoprolol tartrate (LOPRESSOR) 50 MG tablet Take 1 tablet (50 mg total) by mouth 2 (two) times daily. 60 tablet 5  . potassium chloride SA (K-DUR,KLOR-CON) 20 MEQ tablet Take 1 tablet (20 mEq total) by mouth daily. 30 tablet 5  . tamsulosin (FLOMAX) 0.4 MG CAPS capsule Take 0.4 mg by mouth daily.     . ticagrelor (BRILINTA) 90 MG TABS tablet Take 1 tablet (90 mg total) by mouth 2 (two) times daily. 180 tablet 3  . traMADol (ULTRAM) 50 MG tablet TAKE ONE TABLET EVERY SIX HOURS AS NEEDED FOR PAIN. TAKE WITH FOOD 40 tablet 0  . vitamin B-12 (CYANOCOBALAMIN) 1000 MCG tablet Take  1,000 mcg by mouth daily.     No current facility-administered medications for this visit.    Allergies:   Review of patient's allergies indicates no known allergies.    Social History:  The patient  reports that he has quit smoking. He has never used smokeless tobacco. He reports that he does not drink alcohol or use illicit drugs.   Family History:  The patient's family history includes Heart attack in his brother and mother; Heart failure (age of onset: 22) in his mother.    ROS:  General:no colds or fevers, weight continues to decrease, on lasix  Skin:no rashes or ulcers HEENT:no blurred vision, no congestion CV:see HPI PUL:see HPI GI:no diarrhea constipation or melena, no indigestion GU:no hematuria, no dysuria MS:no joint pain, no claudication Neuro:no syncope, no lightheadedness Endo:no diabetes, no thyroid disease  Wt Readings from Last 3 Encounters:  12/13/14 232 lb 6.4 oz (105.416 kg)  12/09/14 237 lb (107.502 kg)  12/02/14 242 lb (109.77 kg)     PHYSICAL EXAM: VS:  BP 126/74 mmHg  Pulse 63  Ht 5\' 9"  (1.753 m)  Wt 232 lb 6.4 oz (105.416 kg)  BMI 34.30 kg/m2 , BMI Body mass index is 34.3 kg/(m^2). General:Pleasant affect, NAD Skin:Warm and dry, brisk capillary refill HEENT:normocephalic, sclera clear, mucus membranes moist Neck:supple, no JVD, no bruits  Heart:S1S2 RRR with 2/6 systolic murmur, no gallup, rub or click Lungs:clear without rales, rhonchi, or wheezes OTL:XBWI, non tender, + BS, do not palpate liver spleen or masses Ext:tr to 1+ lower ext edema, 2+ pedal pulses, 2+ radial pulses Neuro:alert and oriented x 3, MAE, follows commands, + facial symmetry    EKG:  EKG is ordered today. The ekg ordered today demonstrates SR Lt atrial enlargement, incomplete RBBB.  New T wave inversion III, post MI   Recent Labs: 06/19/2014: ALT 41 11/14/2014: B Natriuretic Peptide 734.6*; Magnesium 2.1; TSH 0.676 11/17/2014: Hemoglobin 14.4; Platelets 120* 12/09/2014:  BUN 16; Creatinine 1.29; Potassium 3.7; Sodium 137    Lipid Panel    Component Value Date/Time   CHOL 124 05/19/2014 1051   TRIG 212* 05/19/2014 1051   HDL 33* 05/19/2014 1051   CHOLHDL 3.8 05/19/2014 1051   VLDL 42* 05/19/2014 1051   LDLCALC 49 05/19/2014 1051       Other studies Reviewed: Additional studies/ records that were reviewed today include: previous labs, hospital notes and cath and previous OV.   ASSESSMENT AND PLAN:  Acute diastolic HF, improved now on lasix, continue at current dose for now but if wt continues to  decrease may need to decrease to once daily.   CAD including NSTEMI  with + nuc study undergoing cath and DES to mild LAD and proximal RCA with DES Xience stents.  Stable. Continue Brilinta and ASA for 1 year at least- follow up with Dr. Percival Spanish in 6-8 weeks.   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 Percival Spanish, pt has had since 2014, will monitor but no need for TEE now.  History of PSVT (paroxysmal supraventricular tachycardia none recently.   Dyslipidemia- will need statin begin Lipitor 10 mg daily Current medicines are reviewed with the patient today.  The patient Has no concerns regarding medicines.  HTN controlled on current meds  Chronic renal disease, stage III followed by PCP. Stable post cath.   The following changes have been made:  See above Labs/ tests ordered today include:see above  Disposition:   FU:  see above  Lennie Muckle, NP  12/13/2014 2:33 PM    [facility] [place], [place], [place], [place] Phone: [phone]; Fax: [phone]

## 2014-12-14 ENCOUNTER — Encounter: Payer: Self-pay | Admitting: Cardiology

## 2014-12-14 ENCOUNTER — Other Ambulatory Visit: Payer: Self-pay | Admitting: Family Medicine

## 2014-12-14 MED ORDER — FUROSEMIDE 40 MG PO TABS: 80.0000 mg | ORAL_TABLET | Freq: Two times a day (BID) | ORAL | Status: DC

## 2014-12-14 NOTE — Telephone Encounter (Signed)
Medication called/sent to requested pharmacy  

## 2014-12-16 ENCOUNTER — Telehealth: Payer: Self-pay | Admitting: Cardiology

## 2014-12-16 NOTE — Telephone Encounter (Signed)
Left message for [name], last 2d echo was 11-16-14 with EF 55-60%.

## 2014-12-16 NOTE — Telephone Encounter (Signed)
New message      Pt is enrolled in heart failure clinic.  Please call with most recent ejection fraction.  Ok to leave on vm it is confidential.

## 2014-12-22 ENCOUNTER — Telehealth: Payer: Self-pay | Admitting: Physician Assistant

## 2014-12-22 NOTE — Telephone Encounter (Signed)
Pt need a refill on one of his medicine. She says it is a yellow pill and she does not lnow the name of it.

## 2014-12-22 NOTE — Telephone Encounter (Signed)
Spoke to wife, she thinks what was needed was refill for a medication he was started on at discharge. She is not sure which one it is - we went through all the meds that were started/refilled at his discharge. We determined pt should have refills on hand for medications. She will contact pharmacy to help identify medication.

## 2014-12-26 ENCOUNTER — Telehealth: Payer: Self-pay | Admitting: Cardiology

## 2014-12-26 NOTE — Telephone Encounter (Signed)
Dr. Tommy Rainwater needs clearance for catarac surgery scheduled for 5-30

## 2014-12-26 NOTE — Telephone Encounter (Signed)
Request for surgical clearance:  1. What type of surgery is being performed? Cataract surgery   2. When is this surgery scheduled? 01/02/2015  3. Are there any medications that need to be held prior to surgery and how long? No medications  held   4. Name of physician performing surgery? Dr. Ezzie Dural avis   5. What is your office phone and fax number? Phone [phone] Fax # [phone]  Comments: Sharyn Lull with Dr. Tommy Rainwater office called, she reports that the pt is scheduled for surgery.Sharyn Lull believed she has previously faxed over surgical clearanceto the wrong location. She states that she has continually faxed this clearance since Since April 10th and will need the form faxed back by Thursday 12/29/2014. Please assist

## 2014-12-26 NOTE — Telephone Encounter (Signed)
Cataract surgery would be OK from a risk standpoint.  However, he cannot stop either of his antiplatelet agents for any elective procedure.

## 2015-01-02 DIAGNOSIS — H25011 Cortical age-related cataract, right eye: Secondary | ICD-10-CM | POA: Diagnosis not present

## 2015-01-02 DIAGNOSIS — Z961 Presence of intraocular lens: Secondary | ICD-10-CM | POA: Diagnosis not present

## 2015-01-02 DIAGNOSIS — H25812 Combined forms of age-related cataract, left eye: Secondary | ICD-10-CM | POA: Diagnosis not present

## 2015-01-02 DIAGNOSIS — H25012 Cortical age-related cataract, left eye: Secondary | ICD-10-CM | POA: Diagnosis not present

## 2015-01-02 DIAGNOSIS — H2512 Age-related nuclear cataract, left eye: Secondary | ICD-10-CM | POA: Diagnosis not present

## 2015-01-03 DIAGNOSIS — H25011 Cortical age-related cataract, right eye: Secondary | ICD-10-CM | POA: Diagnosis not present

## 2015-01-06 ENCOUNTER — Telehealth: Payer: Self-pay | Admitting: *Deleted

## 2015-01-06 MED ORDER — TRAMADOL HCL 50 MG PO TABS: ORAL_TABLET | ORAL | Status: DC

## 2015-01-06 NOTE — Telephone Encounter (Signed)
Received a call from [facility] requesting a refill for Tramadol 50mg   Last rf 11/19/14  Last ov 12/09/14  ? Ok to refill

## 2015-01-06 NOTE — Telephone Encounter (Signed)
Script called in to pharmacy  

## 2015-01-06 NOTE — Telephone Encounter (Signed)
ok 

## 2015-01-12 ENCOUNTER — Encounter: Payer: Self-pay | Admitting: Family Medicine

## 2015-01-12 ENCOUNTER — Ambulatory Visit (INDEPENDENT_AMBULATORY_CARE_PROVIDER_SITE_OTHER): Payer: Medicare Other | Admitting: Family Medicine

## 2015-01-12 VITALS — BP 110/60 | HR 76 | Temp 98.3°F | Resp 18 | Ht 67.0 in | Wt 234.0 lb

## 2015-01-12 DIAGNOSIS — M25561 Pain in right knee: Secondary | ICD-10-CM

## 2015-01-12 NOTE — Progress Notes (Signed)
Subjective:    Patient ID: [name], male    DOB: November 04, 1928, 81 y.o.   MRN: [id number]  HPI Patient has been working in his garden more recently. Over the last few months he has developed worsening pain in his right knee. The pain seems to be centered over the right medial joint line. He is tender to palpation along the right medial joint line. There may be a mild effusion. He is taken some Aleve which helped the pain within an hour. However he is not supposed to be taking Aleve given his history of recent cardiovascular disease and the fact he is on dual antiplatelets therapy. He has not tried any Tylenol Past Medical History  Diagnosis Date  . Hypertension   . OA (osteoarthritis)     rt leg  . ED (erectile dysfunction)   . Chronic fatigue   . Gout   . Bowing of leg   . Spinal stenosis   . Prostate cancer   . CHF (congestive heart failure)   . Elevated troponin 11/14/2014   Past Surgical History  Procedure Laterality Date  . Back surgery x 2    . Rt rotator cuff repair    . Cervical spine surgery    . Back surgery    . Left heart catheterization with coronary angiogram N/A 11/16/2014    Procedure: LEFT HEART CATHETERIZATION WITH CORONARY ANGIOGRAM;  Surgeon: Troy Sine, MD;  Location: [facility] CATH LAB;  Service: Cardiovascular;  Laterality: N/A;   Current Outpatient Prescriptions on File Prior to Visit  Medication Sig Dispense Refill  . amLODipine (NORVASC) 5 MG tablet Take 1 tablet (5 mg total) by mouth daily. 30 tablet 11  . aspirin EC 81 MG EC tablet Take 1 tablet (81 mg total) by mouth daily.    . diclofenac sodium (VOLTAREN) 1 % GEL Apply 4 g topically 3 (three) times daily as needed (pain).     . finasteride (PROSCAR) 5 MG tablet Take 1 tablet (5 mg total) by mouth daily. 30 tablet 11  . fluticasone (FLONASE) 50 MCG/ACT nasal spray Place 2 sprays into both nostrils daily. 16 g 11  . furosemide (LASIX) 40 MG tablet Take 2 tablets (80 mg total) by mouth 2 (two) times  daily. 120 tablet 5  . Menthol-Methyl Salicylate (MUSCLE RUB) 10-15 % CREA Apply 1 application topically 2 (two) times daily.     . metoprolol tartrate (LOPRESSOR) 50 MG tablet Take 1 tablet (50 mg total) by mouth 2 (two) times daily. 60 tablet 5  . potassium chloride SA (K-DUR,KLOR-CON) 20 MEQ tablet Take 1 tablet (20 mEq total) by mouth daily. 30 tablet 5  . tamsulosin (FLOMAX) 0.4 MG CAPS capsule Take 0.4 mg by mouth daily.     . ticagrelor (BRILINTA) 90 MG TABS tablet Take 1 tablet (90 mg total) by mouth 2 (two) times daily. 180 tablet 3  . traMADol (ULTRAM) 50 MG tablet TAKE ONE TABLET EVERY SIX HOURS AS NEEDED FOR PAIN. TAKE WITH FOOD 40 tablet 0  . vitamin B-12 (CYANOCOBALAMIN) 1000 MCG tablet Take 1,000 mcg by mouth daily.     No current facility-administered medications on file prior to visit.   No Known Allergies History   Social History  . Marital Status: Married    Spouse Name: N/A  . Number of Children: 4  . Years of Education: N/A   Occupational History  .     Social History Main Topics  . Smoking status: Former Research scientist (life sciences)  .  Smokeless tobacco: Never Used     Comment: QUIT SMOKING  MANY YEARS AGO"  . Alcohol Use: No  . Drug Use: No  . Sexual Activity: Not on file   Other Topics Concern  . Not on file   Social History Narrative   Four adopted children.  Lives alone.      Review of Systems  All other systems reviewed and are negative.      Objective:   Physical Exam  Cardiovascular: Normal rate and regular rhythm.   Pulmonary/Chest: Effort normal and breath sounds normal.  Musculoskeletal:       Right knee: He exhibits decreased range of motion. He exhibits no swelling. Tenderness found. Medial joint line tenderness noted. No lateral joint line, no MCL and no LCL tenderness noted.       Left knee: He exhibits normal range of motion, no LCL laxity, normal meniscus and no MCL laxity. No tenderness found. No medial joint line and no lateral joint line tenderness  noted.  Vitals reviewed.         Assessment & Plan:  Right medial knee pain  I believe the patient's knee pain is likely due to osteoarthritis although I cannot completely exclude a small meniscal tear. I recommended Tylenol 500 mg by mouth every 6 hours when necessary pain. The patient will rest for the next week. If the rest and Tylenol is not helping his knee pain he could return for cortisone injection in the knee

## 2015-01-20 DIAGNOSIS — H25811 Combined forms of age-related cataract, right eye: Secondary | ICD-10-CM | POA: Diagnosis not present

## 2015-01-20 DIAGNOSIS — Z961 Presence of intraocular lens: Secondary | ICD-10-CM | POA: Diagnosis not present

## 2015-01-20 DIAGNOSIS — H2511 Age-related nuclear cataract, right eye: Secondary | ICD-10-CM | POA: Diagnosis not present

## 2015-01-24 ENCOUNTER — Encounter: Payer: Self-pay | Admitting: Family Medicine

## 2015-01-24 ENCOUNTER — Ambulatory Visit (INDEPENDENT_AMBULATORY_CARE_PROVIDER_SITE_OTHER): Payer: Medicare Other | Admitting: Family Medicine

## 2015-01-24 VITALS — BP 112/60 | HR 72 | Temp 98.6°F | Resp 16 | Ht 67.0 in | Wt 239.0 lb

## 2015-01-24 DIAGNOSIS — M25561 Pain in right knee: Secondary | ICD-10-CM | POA: Diagnosis not present

## 2015-01-24 NOTE — Progress Notes (Signed)
Subjective:    Patient ID: [name], male    DOB: [birth year], 81 y.o.   MRN: [id number]  HPI 01/12/15 Patient has been working in his garden more recently. Over the last few months he has developed worsening pain in his right knee. The pain seems to be centered over the right medial joint line. He is tender to palpation along the right medial joint line. There may be a mild effusion. He is taken some Aleve which helped the pain within an hour. However he is not supposed to be taking Aleve given his history of recent cardiovascular disease and the fact he is on dual antiplatelets therapy. He has not tried any Tylenol.  At that time, my plan was: I believe the patient's knee pain is likely due to osteoarthritis although I cannot completely exclude a small meniscal tear. I recommended Tylenol 500 mg by mouth every 6 hours when necessary pain. The patient will rest for the next week. If the rest and Tylenol is not helping his knee pain he could return for cortisone injection in the knee  01/24/15 Patient is here today for recheck. The pain is no better in his knee.  He continues to complain of pain over the medial joint line both anteriorly and posteriorly. He continues to complain of pain with prolonged standing and walking. It hurts to go up and down steps Past Medical History  Diagnosis Date  . Hypertension   . OA (osteoarthritis)     rt leg  . ED (erectile dysfunction)   . Chronic fatigue   . Gout   . Bowing of leg   . Spinal stenosis   . Prostate cancer   . CHF (congestive heart failure)   . Elevated troponin 11/14/2014   Past Surgical History  Procedure Laterality Date  . Back surgery x 2    . Rt rotator cuff repair    . Cervical spine surgery    . Back surgery    . Left heart catheterization with coronary angiogram N/A 11/16/2014    Procedure: LEFT HEART CATHETERIZATION WITH CORONARY ANGIOGRAM;  Surgeon: Troy Sine, MD;  Location: [facility] CATH LAB;  Service: Cardiovascular;   Laterality: N/A;   Current Outpatient Prescriptions on File Prior to Visit  Medication Sig Dispense Refill  . amLODipine (NORVASC) 5 MG tablet Take 1 tablet (5 mg total) by mouth daily. 30 tablet 11  . aspirin EC 81 MG EC tablet Take 1 tablet (81 mg total) by mouth daily.    . diclofenac sodium (VOLTAREN) 1 % GEL Apply 4 g topically 3 (three) times daily as needed (pain).     . finasteride (PROSCAR) 5 MG tablet Take 1 tablet (5 mg total) by mouth daily. 30 tablet 11  . fluticasone (FLONASE) 50 MCG/ACT nasal spray Place 2 sprays into both nostrils daily. 16 g 11  . furosemide (LASIX) 40 MG tablet Take 2 tablets (80 mg total) by mouth 2 (two) times daily. 120 tablet 5  . Menthol-Methyl Salicylate (MUSCLE RUB) 10-15 % CREA Apply 1 application topically 2 (two) times daily.     . metoprolol tartrate (LOPRESSOR) 50 MG tablet Take 1 tablet (50 mg total) by mouth 2 (two) times daily. 60 tablet 5  . potassium chloride SA (K-DUR,KLOR-CON) 20 MEQ tablet Take 1 tablet (20 mEq total) by mouth daily. 30 tablet 5  . tamsulosin (FLOMAX) 0.4 MG CAPS capsule Take 0.4 mg by mouth daily.     . ticagrelor (BRILINTA) 90 MG TABS  tablet Take 1 tablet (90 mg total) by mouth 2 (two) times daily. 180 tablet 3  . traMADol (ULTRAM) 50 MG tablet TAKE ONE TABLET EVERY SIX HOURS AS NEEDED FOR PAIN. TAKE WITH FOOD 40 tablet 0  . vitamin B-12 (CYANOCOBALAMIN) 1000 MCG tablet Take 1,000 mcg by mouth daily.     No current facility-administered medications on file prior to visit.   No Known Allergies History   Social History  . Marital Status: Married    Spouse Name: N/A  . Number of Children: 4  . Years of Education: N/A   Occupational History  .     Social History Main Topics  . Smoking status: Former Research scientist (life sciences)  . Smokeless tobacco: Never Used     Comment: QUIT SMOKING  MANY YEARS AGO"  . Alcohol Use: No  . Drug Use: No  . Sexual Activity: Not on file   Other Topics Concern  . Not on file   Social History  Narrative   Four adopted children.  Lives alone.      Review of Systems  All other systems reviewed and are negative.      Objective:   Physical Exam  Cardiovascular: Normal rate and regular rhythm.   Pulmonary/Chest: Effort normal and breath sounds normal.  Musculoskeletal:       Right knee: He exhibits decreased range of motion. He exhibits no swelling. Tenderness found. Medial joint line tenderness noted. No lateral joint line, no MCL and no LCL tenderness noted.       Left knee: He exhibits normal range of motion, no LCL laxity, normal meniscus and no MCL laxity. No tenderness found. No medial joint line and no lateral joint line tenderness noted.  Vitals reviewed.         Assessment & Plan:  Right medial knee pain  I suspect osteoarthritis. However I cannot exclude a small meniscal tear. Using sterile technique, I injected his right knee with a mixture of 2 mL of lidocaine, 2 mL of Marcaine, and 2 mL of 40 mg per mL Kenalog. The patient tolerated the procedure well without complication. Recheck in one week if no better

## 2015-02-15 ENCOUNTER — Ambulatory Visit: Payer: Medicare Other | Admitting: Cardiology

## 2015-02-16 ENCOUNTER — Other Ambulatory Visit: Payer: Self-pay | Admitting: Family Medicine

## 2015-02-16 NOTE — Telephone Encounter (Signed)
?   OK to Refill  

## 2015-02-17 NOTE — Telephone Encounter (Signed)
ok 

## 2015-02-17 NOTE — Telephone Encounter (Signed)
Medication refilled per protocol. 

## 2015-03-01 ENCOUNTER — Encounter: Payer: Self-pay | Admitting: Cardiology

## 2015-03-07 ENCOUNTER — Other Ambulatory Visit: Payer: Self-pay | Admitting: Family Medicine

## 2015-03-07 NOTE — Telephone Encounter (Signed)
?   OK to Refill  

## 2015-03-07 NOTE — Telephone Encounter (Signed)
ok 

## 2015-03-07 NOTE — Telephone Encounter (Signed)
Medication refilled per protocol. 

## 2015-03-15 ENCOUNTER — Telehealth: Payer: Self-pay | Admitting: Cardiology

## 2015-03-15 NOTE — Telephone Encounter (Signed)
Jody called from HF program at [facility] - trying to get updated VS for patient and also wanted to see if we'd heard from patient - she had been unsuccessful at getting him to return call.  I gave pt phone call at listed number - informed him Jody had reached out to [place]  - gave patient her number and extension to return call.  Called Jody back and informed her. She voiced thanks for the help. No other concerns on part of patient or case Freight forwarder.

## 2015-03-16 ENCOUNTER — Encounter: Payer: Self-pay | Admitting: Physician Assistant

## 2015-03-16 ENCOUNTER — Ambulatory Visit (INDEPENDENT_AMBULATORY_CARE_PROVIDER_SITE_OTHER): Payer: Medicare Other | Admitting: Physician Assistant

## 2015-03-16 VITALS — BP 136/70 | HR 68 | Temp 97.6°F | Resp 14 | Ht 67.0 in | Wt 238.0 lb

## 2015-03-16 DIAGNOSIS — R739 Hyperglycemia, unspecified: Secondary | ICD-10-CM

## 2015-03-16 DIAGNOSIS — R609 Edema, unspecified: Secondary | ICD-10-CM

## 2015-03-16 LAB — GLUCOSE, FINGERSTICK (STAT): Glucose, fingerstick: 97 mg/dL (ref 70–99)

## 2015-03-16 LAB — HEMOGLOBIN A1C, FINGERSTICK: HEMOGLOBIN A1C, FINGERSTICK: 6.8 % — AB (ref <5.7)

## 2015-03-16 NOTE — Progress Notes (Signed)
Patient ID: DORYAN BAHL MRN: [id number], DOB: Dec 23, 1928, 81 y.o. Date of Encounter: 03/16/2015, 3:51 PM    Chief Complaint:  Chief Complaint  Patient presents with  . Medication Management    states that he is taking ASA and Brilenta and is noting increased discoloration to arms and legs  . R Ankle Pain    states that he is taking Lasix as directed, but fluid is not improving and he has pain in R ankle     HPI: 81 y.o. year old male presents with above.   Also says that he checked his blood sugar fasting in the morning and got 176.  Says that he wants me to check his blood sugar again now and see what reading we get. It is currently 3:52 PM. He says he ate breakfast around 9 AM. Says he doesn't eat lunch. Did eat part of a little cookie around 1:30 or 2:00. Says he has also been sipping on a Coca-Cola. Cannot tell me what time he was drinking that. When I ask about this he says "Oh, that's sitting out in the car now".     Home Meds:   Outpatient Prescriptions Prior to Visit  Medication Sig Dispense Refill  . amLODipine (NORVASC) 5 MG tablet Take 1 tablet (5 mg total) by mouth daily. 30 tablet 11  . aspirin EC 81 MG EC tablet Take 1 tablet (81 mg total) by mouth daily.    . diclofenac sodium (VOLTAREN) 1 % GEL Apply 4 g topically 3 (three) times daily as needed (pain).     . finasteride (PROSCAR) 5 MG tablet Take 1 tablet (5 mg total) by mouth daily. 30 tablet 11  . fluticasone (FLONASE) 50 MCG/ACT nasal spray Place 2 sprays into both nostrils daily. 16 g 11  . furosemide (LASIX) 40 MG tablet Take 2 tablets (80 mg total) by mouth 2 (two) times daily. 120 tablet 5  . Menthol-Methyl Salicylate (MUSCLE RUB) 10-15 % CREA Apply 1 application topically 2 (two) times daily.     . metoprolol tartrate (LOPRESSOR) 50 MG tablet Take 1 tablet (50 mg total) by mouth 2 (two) times daily. 60 tablet 5  . potassium chloride SA (K-DUR,KLOR-CON) 20 MEQ tablet Take 1 tablet (20 mEq total) by  mouth daily. 30 tablet 5  . tamsulosin (FLOMAX) 0.4 MG CAPS capsule Take 0.4 mg by mouth daily.     . ticagrelor (BRILINTA) 90 MG TABS tablet Take 1 tablet (90 mg total) by mouth 2 (two) times daily. 180 tablet 3  . traMADol (ULTRAM) 50 MG tablet TAKE ONE TABLET EVERY SIX HOURS WITH FOOD AS NEEDED FOR PAIN 40 tablet 0  . vitamin B-12 (CYANOCOBALAMIN) 1000 MCG tablet Take 1,000 mcg by mouth daily.     No facility-administered medications prior to visit.    Allergies: No Known Allergies    Review of Systems: See HPI for pertinent ROS. All other ROS negative.    Physical Exam: Blood pressure 136/70, pulse 68, temperature 97.6 F (36.4 C), temperature source Oral, resp. rate 14, height 5\' 7"  (1.702 m), weight 238 lb (107.956 kg)., Body mass index is 37.27 kg/(m^2). General:  WNWD Male. Appears in no acute distress. Neck: Supple. No thyromegaly. No lymphadenopathy. Lungs: Clear bilaterally to auscultation without wheezes, rales, or rhonchi. Breathing is unlabored. Heart: Regular rhythm.  Msk:  Strength and tone normal for age. Extremities/Skin: 1+ Bilateral Lower Extremity Edema. Left medial calf with approx 1 inch area of pink/purplish petichial ecchymosis. Neuro:  Alert and oriented X 3. Moves all extremities spontaneously. Gait is normal. CNII-XII grossly in tact. Psych:  Responds to questions appropriately with a normal affect.     ASSESSMENT AND PLAN:  81 y.o. year old male with  1. Peripheral edema Asked if his current swelling is better/same/worse than usual--- i.e. compared to 2 weeks ago is it better or worse of the same. His response is "some days is better than others"---" that's why Dr. Dennard Schaumann has told me on some days I take an extra pill or 2 extra pills. Says he can take extra pill for 3 days. If it is not better in 3 days, then he knows to see him." Says "that's the way it is". Reviewed the weight today is 238. Weight at last office visit here 01/24/15 was 239.  2.  Pettichiae/Ecchymosis: On aspirin an dBrintilix. He had stents during last hospitalization which was around April/May. Discussed that he needs these medications to keep his stents patent. Discussed that if these areas on his skin get worse then he would need to follow-up with his cardiologist. He says he has follow-up appointment scheduled with them for later this month.  2. Hyperglycemia - Glucose, fingerstick (stat) - Hemoglobin A1C, fingerstick   Signed, [address] [place], [place], [facility] 03/16/2015 3:51 PM

## 2015-03-30 ENCOUNTER — Encounter: Payer: Self-pay | Admitting: Family Medicine

## 2015-04-06 ENCOUNTER — Ambulatory Visit: Payer: Medicare Other | Admitting: Podiatry

## 2015-04-12 ENCOUNTER — Other Ambulatory Visit: Payer: Self-pay | Admitting: Family Medicine

## 2015-04-12 NOTE — Telephone Encounter (Signed)
LRF 03/07/15 #40  LOV 03/16/15  OK refill?  Still need to tell him about recent labs, he has not return my calls.

## 2015-04-13 ENCOUNTER — Ambulatory Visit (INDEPENDENT_AMBULATORY_CARE_PROVIDER_SITE_OTHER): Payer: Medicare Other | Admitting: Physician Assistant

## 2015-04-13 ENCOUNTER — Encounter: Payer: Self-pay | Admitting: Physician Assistant

## 2015-04-13 VITALS — BP 120/76 | HR 60 | Temp 97.4°F | Resp 18 | Wt 238.0 lb

## 2015-04-13 DIAGNOSIS — L03031 Cellulitis of right toe: Secondary | ICD-10-CM

## 2015-04-13 DIAGNOSIS — Z23 Encounter for immunization: Secondary | ICD-10-CM

## 2015-04-13 MED ORDER — SULFAMETHOXAZOLE-TRIMETHOPRIM 800-160 MG PO TABS: 1.0000 | ORAL_TABLET | Freq: Two times a day (BID) | ORAL | Status: DC

## 2015-04-13 NOTE — Telephone Encounter (Signed)
ok 

## 2015-04-13 NOTE — Progress Notes (Signed)
Patient ID: [name] MRN: [id number], DOB: 1929/05/08, 81 y.o. Date of Encounter: 04/13/2015, 5:43 PM    Chief Complaint:  Chief Complaint  Patient presents with  . c/o problem with throat    diff w/ swallowing, dry mouth  . skin tear rt foot     HPI: 81 y.o. year old male presents with above.  Regarding the first complaint listed above----he says that his mouth is just always really try and he is wondering if this is secondary to one of his medications. I reviewed his medications and told him that the only medicine that I see that would maybe cause dry mouth is his diuretic. However he needs to continue the diaphoretic and this cannot be discontinued. Reassured him that I see no other medications that we can adjust that would affect this dry mouth.  He also states that he had a callus on his foot. Says then they had put a bandage over it but when his wife removed the bandage it "pulled off 2 layers of skin". Says then a man at church stepped on it and that's when I really started having problems".      Home Meds:   Outpatient Prescriptions Prior to Visit  Medication Sig Dispense Refill  . amLODipine (NORVASC) 5 MG tablet Take 1 tablet (5 mg total) by mouth daily. 30 tablet 11  . aspirin EC 81 MG EC tablet Take 1 tablet (81 mg total) by mouth daily.    . diclofenac sodium (VOLTAREN) 1 % GEL Apply 4 g topically 3 (three) times daily as needed (pain).     . finasteride (PROSCAR) 5 MG tablet Take 1 tablet (5 mg total) by mouth daily. 30 tablet 11  . fluticasone (FLONASE) 50 MCG/ACT nasal spray Place 2 sprays into both nostrils daily. 16 g 11  . furosemide (LASIX) 40 MG tablet Take 2 tablets (80 mg total) by mouth 2 (two) times daily. 120 tablet 5  . Menthol-Methyl Salicylate (MUSCLE RUB) 10-15 % CREA Apply 1 application topically 2 (two) times daily.     . metoprolol tartrate (LOPRESSOR) 50 MG tablet Take 1 tablet (50 mg total) by mouth 2 (two) times daily. 60 tablet 5  .  potassium chloride SA (K-DUR,KLOR-CON) 20 MEQ tablet Take 1 tablet (20 mEq total) by mouth daily. 30 tablet 5  . tamsulosin (FLOMAX) 0.4 MG CAPS capsule Take 0.4 mg by mouth daily.     . ticagrelor (BRILINTA) 90 MG TABS tablet Take 1 tablet (90 mg total) by mouth 2 (two) times daily. 180 tablet 3  . traMADol (ULTRAM) 50 MG tablet TAKE ONE TABLET EVERY SIX HOURS WITH FOOD AS NEEDED FOR PAIN 40 tablet 0  . vitamin B-12 (CYANOCOBALAMIN) 1000 MCG tablet Take 1,000 mcg by mouth daily.     No facility-administered medications prior to visit.    Allergies: No Known Allergies    Review of Systems: See HPI for pertinent ROS. All other ROS negative.    Physical Exam: Blood pressure 120/76, pulse 60, temperature 97.4 F (36.3 C), temperature source Oral, resp. rate 18, weight 238 lb (107.956 kg)., Body mass index is 37.27 kg/(m^2). General: WNWD Male.  Appears in no acute distress. Neck: Supple. No thyromegaly. No lymphadenopathy. Lungs: Clear bilaterally to auscultation without wheezes, rales, or rhonchi. Breathing is unlabored. Heart: Regular rhythm. No murmurs, rubs, or gallops. Msk:  Strength and tone normal for age. Extremities/Skin:  Right Foot: Plantar Surface: At level of "ball of foot", at level of  1st toe, there is the following area: There is a 1.5 cm diameter area that is white in color----- appears as if it is either a blister with clear fluid or purulent fluid beneath the skin to cause this appearance-----however, I punctured this with a scapel and absolutely no drainage present at all--no clear, serous drainage, and no purulent drainage-- The skin on top of this area that I used scapel--is quite thick--but I used scapel to remove this skin, but got down to "tissue" with no drainage present. On the medial aspect and superior aspect of this "white area"--there is area of surrounding erythema that measures  1.5cm at the superior aspect and measures 1.0cm at the medial aspect.  The  erythema does not extend to the inferior or lateral aspects of the "white area" Neuro: Alert and oriented X 3. Moves all extremities spontaneously. Gait is normal. CNII-XII grossly in tact. Psych:  Responds to questions appropriately with a normal affect.     ASSESSMENT AND PLAN:  81 y.o. year old male with  1. Cellulitis of toe of right foot He is to start antibiotic immediately take as directed and complete all of it. He states that he lives with his wife who can inspect his foot twice every day. She is to inspect twice every day and if site worsens at all he is to follow-up immediately. Otherwise, he is to schedule follow-up office visit with me for this upcoming Monday for me to reexamine site and make sure that it is improving. If it does not improve would have to make sure he does not develop osteomyelitis. - sulfamethoxazole-trimethoprim (BACTRIM DS,SEPTRA DS) 800-160 MG per tablet; Take 1 tablet by mouth 2 (two) times daily.  Dispense: 20 tablet; Refill: 0  2. Need for prophylactic vaccination and inoculation against influenza - Flu Vaccine QUAD 36+ mos PF IM (Fluarix & Fluzone Quad PF)   Signed, [address] [place], [place], [facility] 04/13/2015 5:43 PM

## 2015-04-17 ENCOUNTER — Ambulatory Visit (INDEPENDENT_AMBULATORY_CARE_PROVIDER_SITE_OTHER): Payer: Medicare Other | Admitting: Physician Assistant

## 2015-04-17 ENCOUNTER — Other Ambulatory Visit: Payer: Self-pay | Admitting: Family Medicine

## 2015-04-17 ENCOUNTER — Encounter: Payer: Self-pay | Admitting: Physician Assistant

## 2015-04-17 VITALS — BP 124/74 | HR 64 | Temp 97.8°F | Resp 16 | Wt 238.0 lb

## 2015-04-17 DIAGNOSIS — L03115 Cellulitis of right lower limb: Secondary | ICD-10-CM | POA: Diagnosis not present

## 2015-04-17 MED ORDER — DICLOFENAC SODIUM 1 % TD GEL: 4.0000 g | Freq: Three times a day (TID) | TRANSDERMAL | Status: DC | PRN

## 2015-04-17 NOTE — Progress Notes (Signed)
Patient ID: [name] MRN: [id number], DOB: 06/10/1929, 81 y.o. Date of Encounter: 04/17/2015, 12:22 PM    Chief Complaint:  Chief Complaint  Patient presents with  . OTHER    f/u sore on foot     HPI: 81 y.o. year old male presents for follow-up office visit to follow-up regarding wound on foot.  The following is copied from his office visit note with me 04/13/15: He also states that he had a callus on his foot. Says then they had put a bandage over it but when his wife removed the bandage it "pulled off 2 layers of skin". Says then a man at church stepped on it and that's when I really started having problems".  Physical exam at that time revealed :  Right Foot: Plantar Surface: At level of "ball of foot", at level of 1st toe, there is the following area: There is a 1.5 cm diameter area that is white in color----- appears as if it is either a blister with clear fluid or purulent fluid beneath the skin to cause this appearance-----however, I punctured this with a scapel and absolutely no drainage present at all--no clear, serous drainage, and no purulent drainage-- The skin on top of this area that I used scapel--is quite thick--but I used scapel to remove this skin, but got down to "tissue" with no drainage present. On the medial aspect and superior aspect of this "white area"--there is area of surrounding erythema that measures  1.5cm at the superior aspect and measures 1.0cm at the medial aspect.  The erythema does not extend to the inferior or lateral aspects of the "white area"  At that visit I prescribed Bactrim DS 1 by mouth twice a day and had him schedule follow-up office visit today.  Today he reports that he has been taking the antibody As directed with no adverse effects. He thinks that his foot is improving. No other complaints or concerns.   Home Meds:   Outpatient Prescriptions Prior to Visit  Medication Sig Dispense Refill  . amLODipine (NORVASC) 5 MG tablet  Take 1 tablet (5 mg total) by mouth daily. 30 tablet 11  . aspirin EC 81 MG EC tablet Take 1 tablet (81 mg total) by mouth daily.    . finasteride (PROSCAR) 5 MG tablet Take 1 tablet (5 mg total) by mouth daily. 30 tablet 11  . fluticasone (FLONASE) 50 MCG/ACT nasal spray Place 2 sprays into both nostrils daily. 16 g 11  . furosemide (LASIX) 40 MG tablet Take 2 tablets (80 mg total) by mouth 2 (two) times daily. 120 tablet 5  . Menthol-Methyl Salicylate (MUSCLE RUB) 10-15 % CREA Apply 1 application topically 2 (two) times daily.     . metoprolol tartrate (LOPRESSOR) 50 MG tablet Take 1 tablet (50 mg total) by mouth 2 (two) times daily. 60 tablet 5  . potassium chloride SA (K-DUR,KLOR-CON) 20 MEQ tablet Take 1 tablet (20 mEq total) by mouth daily. 30 tablet 5  . sulfamethoxazole-trimethoprim (BACTRIM DS,SEPTRA DS) 800-160 MG per tablet Take 1 tablet by mouth 2 (two) times daily. 20 tablet 0  . tamsulosin (FLOMAX) 0.4 MG CAPS capsule Take 0.4 mg by mouth daily.     . ticagrelor (BRILINTA) 90 MG TABS tablet Take 1 tablet (90 mg total) by mouth 2 (two) times daily. 180 tablet 3  . traMADol (ULTRAM) 50 MG tablet TAKE ONE TABLET EVERY SIX HOURS WITH FOOD AS NEEDED FOR PAIN 40 tablet 0  . vitamin  B-12 (CYANOCOBALAMIN) 1000 MCG tablet Take 1,000 mcg by mouth daily.    . diclofenac sodium (VOLTAREN) 1 % GEL Apply 4 g topically 3 (three) times daily as needed (pain). 100 g 11   No facility-administered medications prior to visit.    Allergies: No Known Allergies    Review of Systems: See HPI for pertinent ROS. All other ROS negative.    Physical Exam: Blood pressure 124/74, pulse 64, temperature 97.8 F (36.6 C), resp. rate 16, weight 238 lb (107.956 kg)., Body mass index is 37.27 kg/(m^2). General: WNWD Male.  Appears in no acute distress. Neck: Supple. No thyromegaly. No lymphadenopathy. Lungs: Clear bilaterally to auscultation without wheezes, rales, or rhonchi. Breathing is unlabored. Heart:  Regular rhythm. No murmurs, rubs, or gallops. Msk:  Strength and tone normal for age. Extremities/Skin:  Right Foot: Plantar Surface: At level of "ball of foot", at level of 1st toe, there is the following area: There is a 1.5 cm diameter area that is white in color---- There is no significant erythema on exam today.  The area where erythema was seen on exam at last visit--that erythema has now resolved. Neuro: Alert and oriented X 3. Moves all extremities spontaneously. Gait is normal. CNII-XII grossly in tact. Psych:  Responds to questions appropriately with a normal affect.     ASSESSMENT AND PLAN:  81 y.o. year old male with   1. Cellulitis of foot, right He is to continue taking the Bactrim. I am scheduling him a follow-up visit at the wound center to make sure that this heals and does not lead to osteomyelitis. He is agreeable to follow-up with this visit. Bacitracin ointment and bandage applied today. - AMB referral to wound care center   Signed, [address] [place], [place], [facility] 04/17/2015 12:22 PM

## 2015-04-18 ENCOUNTER — Telehealth: Payer: Self-pay | Admitting: Family Medicine

## 2015-04-18 ENCOUNTER — Ambulatory Visit (HOSPITAL_COMMUNITY): Payer: Medicare Other | Attending: Physician Assistant | Admitting: Physical Therapy

## 2015-04-18 DIAGNOSIS — M216X1 Other acquired deformities of right foot: Secondary | ICD-10-CM | POA: Diagnosis not present

## 2015-04-18 DIAGNOSIS — L98491 Non-pressure chronic ulcer of skin of other sites limited to breakdown of skin: Secondary | ICD-10-CM | POA: Diagnosis not present

## 2015-04-18 DIAGNOSIS — L84 Corns and callosities: Secondary | ICD-10-CM | POA: Diagnosis not present

## 2015-04-18 NOTE — Therapy (Signed)
[place], [place], [address] Phone: [phone]   Fax:  [phone]  Wound Care Evaluation  Patient Details  Name: [name] MRN: [id number] Date of Birth: 05/18/1929 Referring Provider:  Orlena Sheldon, PA-C  Encounter Date: 04/18/2015      PT End of Session - 04/18/15 1650    Visit Number 1   Number of Visits 2   Date for PT Re-Evaluation 05/02/15   Authorization Type [employer]    PT Start Time 1548   PT Stop Time 1630   PT Time Calculation (min) 42 min   Activity Tolerance Patient tolerated treatment well      Past Medical History  Diagnosis Date  . Hypertension   . OA (osteoarthritis)     rt leg  . ED (erectile dysfunction)   . Chronic fatigue   . Gout   . Bowing of leg   . Spinal stenosis   . Prostate cancer   . CHF (congestive heart failure)   . Elevated troponin 11/14/2014    Past Surgical History  Procedure Laterality Date  . Back surgery x 2    . Rt rotator cuff repair    . Cervical spine surgery    . Back surgery    . Left heart catheterization with coronary angiogram N/A 11/16/2014    Procedure: LEFT HEART CATHETERIZATION WITH CORONARY ANGIOGRAM;  Surgeon: Troy Sine, MD;  Location: [facility] CATH LAB;  Service: Cardiovascular;  Laterality: N/A;    There were no vitals filed for this visit.  Visit Diagnosis:  Prominent metatarsal head of right foot  Pre-ulcerative corn or callous  Callous ulcer, limited to breakdown of skin         Wound Therapy - 04/18/15 1627    Subjective [name] states that over a month ago he noticed that he had "some dry skin" on the bottom of his foot.  He states he had this 20 yrs ago and it went away on it's own.  He states that this time it did not go away so his wife attempted to pull some of the dry skin off but more than one layer came off and he has been having some pain walking on it ever since.    Patient and Family Stated Goals no  pain    Date of Onset 03/06/15   Prior Treatments pt given antibiotic    Pain Assessment No/denies pain  can get as high as a 4/10    Evaluation and Treatment Procedures Explained to Patient/Family Yes   Evaluation and Treatment Procedures agreed to   Wound Therapy - Clinical Statement [name] has been referred to skilled pt for wound care.Currently [name] has a blanched area 1 cm diameter surrounded by a red halo aroung the blanched area with moderate edema.  There is no opening at this time and therpaist would not recommend opening callous secondary to vascular compromise.  [name] is an 81 yo male who has varicosities and edema in his LE.  He  states that he normally uses compression stockings.  He states that he does not have them on at this time secondary to donning and doffing being difficult.He knew that we would want to see his foot; therefore he wore normal socks.    He also has CHF and states that he is on two fluid pills and occationally will take a third.  The patient states that two months ago he had  two stents placed in his heart so there is a good chance that he has decreased arterial flow in his LE as well.  Due to his vascular comprise we will not open the callous up..  I debrided the superficial callous and gave [name] a [facility] forefoot off-loading shoe.  He is instructed to wash and moisturize his foot twice a day and wear the [facility] shoe for two weeks.  We will see him back in two weeks to see if the pressure area looks any better If it doesn't he may benefit from an orthotic consult.     Wound Therapy - Functional Problem List difficulty walking.    Factors Delaying/Impairing Wound Healing Altered sensation;Diabetes Mellitus;Multiple medical problems;Polypharmacy;Vascular compromise   Hydrotherapy Plan Debridement;Other (comment)  pressure off loading shoe(darco).  Given   Wound Therapy - Frequency --  will see in two weeks to assess area.     Wound Therapy - Current  Recommendations PT   Wound Therapy - Follow Up Recommendations Other (comment)   Wound Plan Anticipate discharge in two weeks if blanched area has improved if not request orthotic consult.    Dressing  no dressing needed as there is no open wound,  Pt given a darco shoe.    Patient/Family will be able to  STG:  2 weeks:  ambulate without pain   Patient/Family Instruction Goal - Progress Goal set today   Additional Wound Therapy Goal STG=LTG 2 weeks:  no more blanched area beneath 1st MCP head.  STG=LTG 2 weeks:  Pt to be able to verbalize the importance of wearing compression stockings everyday.    Additional Wound Therapy Goal - Progress Goal set today   Goals/treatment plan/discharge plan were made with and agreed upon by patient/family Yes   Time For Goal Achievement 2 weeks   Wound Therapy - Potential for Goals Good                         PT Education - 05/01/15 1649    Education provided Yes   Education Details Instructed to wash and moisturize twice a day; ambulate with Darco boot only to decrease pressure on area.    Person(s) Educated Patient;Spouse   Methods Explanation   Comprehension Verbalized understanding               G-Codes - 2015-05-01 1655    Functional Limitation Other PT primary   Other PT Primary Current Status (X5284) At least 20 percent but less than 40 percent impaired, limited or restricted   Other PT Primary Goal Status (X3244) At least 1 percent but less than 20 percent impaired, limited or restricted      Problem List Patient Active Problem List   Diagnosis Date Noted  . Type II diabetes mellitus, uncontrolled 03/30/2015  . Mitral valve regurgitation, moderate to severe 11/17/2014  . Non-STEMI (non-ST elevated myocardial infarction)   . History of PSVT (paroxysmal supraventricular tachycardia) 11/15/2014  . Chronic renal disease, stage III 11/15/2014  . Dyslipidemia 11/15/2014  . History of prostate cancer 11/15/2014  .  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 Acute diastolic HF (heart failure) 11/14/2014  . Elevated troponin 11/14/2014  . Dizziness and giddiness 10/20/2013  . Dry eye 09/03/2013  . Dyspnea on exertion 06/29/2013  . Peripheral edema 06/16/2013  . Pulmonary HTN 02/12/2013  . TBI (traumatic brain injury) 11/25/2012  . Orbit fracture 11/25/2012  . Maxillary sinus fracture 11/25/2012  . Sinusitis 11/25/2012  .  Frequent falls 11/25/2012  . OA (osteoarthritis)   . Metabolic syndrome   . ED (erectile dysfunction)   . Chronic fatigue   . Gout   . Bowing of leg   . Spinal stenosis   . Hypertension 05/07/2012  . Dysphagia 05/07/2012    Rayetta Humphrey, PT CLT [id number]  04/18/2015, 4:56 PM  [place] [address] [place], [place], [address] Phone: [phone]   Fax:  [phone]

## 2015-04-18 NOTE — Telephone Encounter (Signed)
PA submitted through [email or web address]  

## 2015-04-19 ENCOUNTER — Other Ambulatory Visit: Payer: Self-pay | Admitting: Physician Assistant

## 2015-04-19 MED ORDER — DICLOFENAC SODIUM 1 % TD GEL: 4.0000 g | Freq: Three times a day (TID) | TRANSDERMAL | Status: DC | PRN

## 2015-04-19 NOTE — Telephone Encounter (Signed)
Approved through 04/17/2016 - pharmacy aware

## 2015-04-25 NOTE — Telephone Encounter (Signed)
Denied if he just got 40 on 9/8

## 2015-05-02 ENCOUNTER — Ambulatory Visit (HOSPITAL_COMMUNITY): Payer: Medicare Other | Admitting: Physical Therapy

## 2015-05-02 ENCOUNTER — Ambulatory Visit (INDEPENDENT_AMBULATORY_CARE_PROVIDER_SITE_OTHER): Payer: Medicare Other | Admitting: Family Medicine

## 2015-05-02 ENCOUNTER — Encounter: Payer: Self-pay | Admitting: Family Medicine

## 2015-05-02 VITALS — BP 110/60 | HR 60 | Temp 98.3°F | Resp 18 | Ht 67.0 in | Wt 242.0 lb

## 2015-05-02 DIAGNOSIS — I10 Essential (primary) hypertension: Secondary | ICD-10-CM | POA: Diagnosis not present

## 2015-05-02 DIAGNOSIS — E08621 Diabetes mellitus due to underlying condition with foot ulcer: Secondary | ICD-10-CM | POA: Diagnosis not present

## 2015-05-02 DIAGNOSIS — I5032 Chronic diastolic (congestive) heart failure: Secondary | ICD-10-CM | POA: Diagnosis not present

## 2015-05-02 DIAGNOSIS — Z0000 Encounter for general adult medical examination without abnormal findings: Secondary | ICD-10-CM | POA: Diagnosis not present

## 2015-05-02 DIAGNOSIS — R609 Edema, unspecified: Secondary | ICD-10-CM | POA: Diagnosis not present

## 2015-05-02 DIAGNOSIS — E119 Type 2 diabetes mellitus without complications: Secondary | ICD-10-CM

## 2015-05-02 DIAGNOSIS — L97519 Non-pressure chronic ulcer of other part of right foot with unspecified severity: Secondary | ICD-10-CM

## 2015-05-02 NOTE — Progress Notes (Signed)
Subjective:    patient ID: [name], male    DOB: 18-Oct-1928, 81 y.o.   MRN: [id number]  HPI Here for CPE.  Has preulcerative callous on the plantar aspect of the right 1st MTP joint.  Has +2 pitting edema in both legs and bibasilar crackles.  He denies shortness of breath, dyspnea on exertion, orthopnea.  Pneumovax 23 and prevnar 13 are UTD.  Due to age 81, not due for cancer screening. Past Medical History  Diagnosis Date  . Hypertension   . OA (osteoarthritis)     rt leg  . ED (erectile dysfunction)   . Chronic fatigue   . Gout   . Bowing of leg   . Spinal stenosis   . Prostate cancer   . CHF (congestive heart failure)   . Elevated troponin 11/14/2014   Past Surgical History  Procedure Laterality Date  . Back surgery x 2    . Rt rotator cuff repair    . Cervical spine surgery    . Back surgery    . Left heart catheterization with coronary angiogram N/A 11/16/2014    Procedure: LEFT HEART CATHETERIZATION WITH CORONARY ANGIOGRAM;  Surgeon: Troy Sine, MD;  Location: [facility] CATH LAB;  Service: Cardiovascular;  Laterality: N/A;   Current Outpatient Prescriptions on File Prior to Visit  Medication Sig Dispense Refill  . amLODipine (NORVASC) 5 MG tablet Take 1 tablet (5 mg total) by mouth daily. 30 tablet 11  . aspirin EC 81 MG EC tablet Take 1 tablet (81 mg total) by mouth daily.    . diclofenac sodium (VOLTAREN) 1 % GEL Apply 4 g topically 3 (three) times daily as needed (pain). 100 g 11  . finasteride (PROSCAR) 5 MG tablet Take 1 tablet (5 mg total) by mouth daily. 30 tablet 11  . fluticasone (FLONASE) 50 MCG/ACT nasal spray Place 2 sprays into both nostrils daily. 16 g 11  . furosemide (LASIX) 40 MG tablet Take 2 tablets (80 mg total) by mouth 2 (two) times daily. 120 tablet 5  . Menthol-Methyl Salicylate (MUSCLE RUB) 10-15 % CREA Apply 1 application topically 2 (two) times daily.     . metoprolol (LOPRESSOR) 50 MG tablet TAKE ONE (1) TABLET BY MOUTH TWO (2) TIMES DAILY 60  tablet 3  . potassium chloride SA (K-DUR,KLOR-CON) 20 MEQ tablet Take 1 tablet (20 mEq total) by mouth daily. 30 tablet 5  . sulfamethoxazole-trimethoprim (BACTRIM DS,SEPTRA DS) 800-160 MG per tablet Take 1 tablet by mouth 2 (two) times daily. 20 tablet 0  . tamsulosin (FLOMAX) 0.4 MG CAPS capsule Take 0.4 mg by mouth daily.     . ticagrelor (BRILINTA) 90 MG TABS tablet Take 1 tablet (90 mg total) by mouth 2 (two) times daily. 180 tablet 3  . traMADol (ULTRAM) 50 MG tablet TAKE ONE (1) TABLET EVERY 6 HOURS WITH FOOD AS NEEDED 40 tablet 0  . vitamin B-12 (CYANOCOBALAMIN) 1000 MCG tablet Take 1,000 mcg by mouth daily.     No current facility-administered medications on file prior to visit.   No Known Allergies Social History   Social History  . Marital Status: Married    Spouse Name: N/A  . Number of Children: 4  . Years of Education: N/A   Occupational History  .     Social History Main Topics  . Smoking status: Former Research scientist (life sciences)  . Smokeless tobacco: Never Used     Comment: QUIT SMOKING  MANY YEARS AGO"  . Alcohol Use: No  .  Drug Use: No  . Sexual Activity: Not on file   Other Topics Concern  . Not on file   Social History Narrative   Four adopted children.  Lives alone.    Family History  Problem Relation Age of Onset  . Heart failure Mother 71  . Heart attack Mother   . Heart attack Brother      Review of Systems  All other systems reviewed and are negative.      Objective:   Physical Exam  Constitutional: He is oriented to person, place, and time. He appears well-developed and well-nourished. No distress.  HENT:  Head: Normocephalic and atraumatic.  Right Ear: External ear normal.  Left Ear: External ear normal.  Nose: Nose normal.  Mouth/Throat: Oropharynx is clear and moist. No oropharyngeal exudate.  Eyes: Conjunctivae and EOM are normal. Pupils are equal, round, and reactive to light. Right eye exhibits no discharge. Left eye exhibits no discharge. No  scleral icterus.  Neck: Normal range of motion. Neck supple. No JVD present. No tracheal deviation present. No thyromegaly present.  Cardiovascular: Normal rate, regular rhythm, normal heart sounds and intact distal pulses.  Exam reveals no gallop and no friction rub.   No murmur heard. Pulmonary/Chest: Effort normal and breath sounds normal. No stridor. No respiratory distress. He has no wheezes. He has no rales. He exhibits no tenderness.  Abdominal: Soft. Bowel sounds are normal. He exhibits no distension and no mass. There is no tenderness. There is no rebound and no guarding.  Musculoskeletal: Normal range of motion. He exhibits edema. He exhibits no tenderness.  Lymphadenopathy:    He has no cervical adenopathy.  Neurological: He is alert and oriented to person, place, and time. He has normal reflexes. He displays normal reflexes. No cranial nerve deficit. He exhibits normal muscle tone. Coordination normal.  Skin: Skin is warm. No rash noted. He is not diaphoretic. No erythema. No pallor.  Psychiatric: He has a normal mood and affect. His behavior is normal. Judgment and thought content normal.   Large 3 cm preulcerative callous on plantar aspect of the right 1st MTP joint.       Assessment & Plan:  Routine general medical examination at a health care facility  Chronic diastolic congestive heart failure  Essential hypertension  Peripheral edema  Diabetes mellitus type II, controlled  Diabetic ulcer of right foot associated with diabetes mellitus due to underlying condition - Plan: Ambulatory referral to Podiatry  Increase lasix to 80 mg pobid (was taking 40 pobid).  Recheck Monday.  Refer to podiatry for preulcerative callous on right foot.  Needs offloading of weight to faciliate healing.  Return for cbc,cmp,flp.  I would not check PSA due to age.

## 2015-05-08 ENCOUNTER — Telehealth: Payer: Self-pay | Admitting: Family Medicine

## 2015-05-08 ENCOUNTER — Encounter: Payer: Self-pay | Admitting: Family Medicine

## 2015-05-08 ENCOUNTER — Ambulatory Visit (INDEPENDENT_AMBULATORY_CARE_PROVIDER_SITE_OTHER): Payer: Medicare Other | Admitting: Family Medicine

## 2015-05-08 VITALS — BP 108/58 | HR 78 | Temp 97.8°F | Resp 20 | Ht 67.0 in | Wt 235.0 lb

## 2015-05-08 DIAGNOSIS — I5032 Chronic diastolic (congestive) heart failure: Secondary | ICD-10-CM | POA: Diagnosis not present

## 2015-05-08 MED ORDER — FUROSEMIDE 40 MG PO TABS: 60.0000 mg | ORAL_TABLET | Freq: Two times a day (BID) | ORAL | Status: DC

## 2015-05-08 NOTE — Telephone Encounter (Signed)
Yes

## 2015-05-08 NOTE — Progress Notes (Signed)
Subjective:    patient ID: [name], male    DOB: 19-Apr-1929, 81 y.o.   MRN: [id number]  HPI 05/02/15 Here for CPE.  Has preulcerative callous on the plantar aspect of the right 1st MTP joint.  Has +2 pitting edema in both legs and bibasilar crackles.  He denies shortness of breath, dyspnea on exertion, orthopnea.  Pneumovax 23 and prevnar 13 are UTD.  Due to age, not due for cancer screening.  At that time, my plan was: Increase lasix to 80 mg pobid (was taking 40 pobid).  Recheck Monday.  Refer to podiatry for preulcerative callous on right foot.  Needs offloading of weight to faciliate healing.  Return for cbc,cmp,flp.  I would not check PSA due to age.  05/08/15 He is here today for follow up.  Swelling is much better. He has lost 7 pounds since when I last saw him. He now has +1 pitting edema in both legs which is closer to the patient's baseline. He denies any chest pain or shortness of breath. Past Medical History  Diagnosis Date  . Hypertension   . OA (osteoarthritis)     rt leg  . ED (erectile dysfunction)   . Chronic fatigue   . Gout   . Bowing of leg   . Spinal stenosis   . Prostate cancer   . CHF (congestive heart failure)   . Elevated troponin 11/14/2014   Past Surgical History  Procedure Laterality Date  . Back surgery x 2    . Rt rotator cuff repair    . Cervical spine surgery    . Back surgery    . Left heart catheterization with coronary angiogram N/A 11/16/2014    Procedure: LEFT HEART CATHETERIZATION WITH CORONARY ANGIOGRAM;  Surgeon: Troy Sine, MD;  Location: [facility] CATH LAB;  Service: Cardiovascular;  Laterality: N/A;   Current Outpatient Prescriptions on File Prior to Visit  Medication Sig Dispense Refill  . amLODipine (NORVASC) 5 MG tablet Take 1 tablet (5 mg total) by mouth daily. 30 tablet 11  . aspirin EC 81 MG EC tablet Take 1 tablet (81 mg total) by mouth daily.    . diclofenac sodium (VOLTAREN) 1 % GEL Apply 4 g topically 3 (three) times daily  as needed (pain). 100 g 11  . finasteride (PROSCAR) 5 MG tablet Take 1 tablet (5 mg total) by mouth daily. 30 tablet 11  . fluticasone (FLONASE) 50 MCG/ACT nasal spray Place 2 sprays into both nostrils daily. 16 g 11  . furosemide (LASIX) 40 MG tablet Take 2 tablets (80 mg total) by mouth 2 (two) times daily. 120 tablet 5  . Menthol-Methyl Salicylate (MUSCLE RUB) 10-15 % CREA Apply 1 application topically 2 (two) times daily.     . metoprolol (LOPRESSOR) 50 MG tablet TAKE ONE (1) TABLET BY MOUTH TWO (2) TIMES DAILY 60 tablet 3  . potassium chloride SA (K-DUR,KLOR-CON) 20 MEQ tablet Take 1 tablet (20 mEq total) by mouth daily. 30 tablet 5  . tamsulosin (FLOMAX) 0.4 MG CAPS capsule Take 0.4 mg by mouth daily.     . ticagrelor (BRILINTA) 90 MG TABS tablet Take 1 tablet (90 mg total) by mouth 2 (two) times daily. 180 tablet 3  . traMADol (ULTRAM) 50 MG tablet TAKE ONE (1) TABLET EVERY 6 HOURS WITH FOOD AS NEEDED 40 tablet 0  . vitamin B-12 (CYANOCOBALAMIN) 1000 MCG tablet Take 1,000 mcg by mouth daily.     No current facility-administered medications on file  prior to visit.   No Known Allergies Social History   Social History  . Marital Status: Married    Spouse Name: N/A  . Number of Children: 4  . Years of Education: N/A   Occupational History  .     Social History Main Topics  . Smoking status: Former Research scientist (life sciences)  . Smokeless tobacco: Never Used     Comment: QUIT SMOKING  MANY YEARS AGO"  . Alcohol Use: No  . Drug Use: No  . Sexual Activity: Not on file   Other Topics Concern  . Not on file   Social History Narrative   Four adopted children.  Lives alone.    Family History  Problem Relation Age of Onset  . Heart failure Mother 32  . Heart attack Mother   . Heart attack Brother      Review of Systems  All other systems reviewed and are negative.      Objective:   Physical Exam  Constitutional: He is oriented to person, place, and time. He appears well-developed and  well-nourished. No distress.  HENT:  Head: Normocephalic and atraumatic.  Right Ear: External ear normal.  Left Ear: External ear normal.  Nose: Nose normal.  Mouth/Throat: Oropharynx is clear and moist. No oropharyngeal exudate.  Eyes: Conjunctivae and EOM are normal. Pupils are equal, round, and reactive to light. Right eye exhibits no discharge. Left eye exhibits no discharge. No scleral icterus.  Neck: Normal range of motion. Neck supple. No JVD present. No tracheal deviation present. No thyromegaly present.  Cardiovascular: Normal rate, regular rhythm, normal heart sounds and intact distal pulses.  Exam reveals no gallop and no friction rub.   No murmur heard. Pulmonary/Chest: Effort normal and breath sounds normal. No stridor. No respiratory distress. He has no wheezes. He has no rales. He exhibits no tenderness.  Abdominal: Soft. Bowel sounds are normal. He exhibits no distension and no mass. There is no tenderness. There is no rebound and no guarding.  Musculoskeletal: Normal range of motion. He exhibits edema. He exhibits no tenderness.  Lymphadenopathy:    He has no cervical adenopathy.  Neurological: He is alert and oriented to person, place, and time. He has normal reflexes. No cranial nerve deficit. He exhibits normal muscle tone. Coordination normal.  Skin: Skin is warm. No rash noted. He is not diaphoretic. No erythema. No pallor.  Psychiatric: He has a normal mood and affect. His behavior is normal. Judgment and thought content normal.   Large 3 cm preulcerative callous on plantar aspect of the right 1st MTP joint.       Assessment & Plan:  Chronic diastolic congestive heart failure (HCC) - Plan: BASIC METABOLIC PANEL WITH GFR, furosemide (LASIX) 40 MG tablet  Decrease Lasix to 60 mg by mouth twice a day. Recheck BMP to monitor renal function as well as potassium.Marland Kitchen

## 2015-05-08 NOTE — Telephone Encounter (Signed)
Patient has an appt this afternoon however he hasn't went to the foot doctor he is scheduled for tomorrow. Does he still need to come in today to see Dr Dennard Schaumann? Please call wife and advise.

## 2015-05-09 ENCOUNTER — Ambulatory Visit (INDEPENDENT_AMBULATORY_CARE_PROVIDER_SITE_OTHER): Payer: Medicare Other | Admitting: Podiatry

## 2015-05-09 ENCOUNTER — Encounter: Payer: Self-pay | Admitting: Podiatry

## 2015-05-09 VITALS — BP 151/67 | HR 61 | Temp 96.8°F | Resp 18

## 2015-05-09 DIAGNOSIS — M216X1 Other acquired deformities of right foot: Secondary | ICD-10-CM

## 2015-05-09 DIAGNOSIS — L84 Corns and callosities: Secondary | ICD-10-CM | POA: Diagnosis not present

## 2015-05-09 DIAGNOSIS — E119 Type 2 diabetes mellitus without complications: Secondary | ICD-10-CM

## 2015-05-09 LAB — BASIC METABOLIC PANEL WITH GFR
BUN: 17 mg/dL (ref 7–25)
CHLORIDE: 99 mmol/L (ref 98–110)
CO2: 30 mmol/L (ref 20–31)
CREATININE: 1.14 mg/dL — AB (ref 0.70–1.11)
Calcium: 9.3 mg/dL (ref 8.6–10.3)
GFR, Est African American: 68 mL/min (ref >=60)
GFR, Est Non African American: 59 mL/min — ABNORMAL LOW (ref >=60)
GLUCOSE: 88 mg/dL (ref 70–99)
Potassium: 3.6 mmol/L (ref 3.5–5.3)
SODIUM: 140 mmol/L (ref 135–146)

## 2015-05-09 NOTE — Progress Notes (Signed)
   Subjective:    Patient ID: DEEN DEGUIA, male    DOB: Mar 02, 1929, 81 y.o.   MRN: [id number]  HPI  81 year old male presents the office today with concerns of a pre-ulcerative lesions on the ball the right foot which is been ongoing for the last several weeks. He previously seen his primary care physician he was given Bactrim for possible cellulitis and referred to the wound care center. He states he did not take the antibiotics and he did go the wound care center but did not go for his follow-up. He states he has pain to the area between with pressure and weightbearing. Denies any opening the skin and denies any drainage. His wife has been applying the corn remover pad over the area. No other complaints at this time.     Review of Systems  HENT: Positive for hearing loss.   Eyes: Positive for itching.  All other systems reviewed and are negative.      Objective:   Physical Exam AAO x3, NAD DP/PT pulses palpable bilaterally, CRT less than 3 seconds Protective sensation intact with Simms Weinstein monofilament, vibratory sensation intact On the right foot there is prominence of the sesamoids and plantarflexion the first metatarsal head. There is a small monitor fluid likely from a bursa underlying the sesamoids. There is a callus overlying the area. Upon debridement of the callus the underlying skin is intact and is no underlying ulceration, drainage or other signs of infection. There is no surrounding erythema, ascending cellulitis, crepitus, malodor, drainage. No other open lesions or pre-ulcerative lesions identified bilaterally. No areas of tenderness to bilateral lower extremities. MMT 5/5, ROM WNL.  No overlying edema, erythema, increase in warmth to bilateral lower extremities.  No pain with calf compression, swelling, warmth, erythema bilaterally.      Assessment & Plan:  81 year old male right plantarflexed first metatarsal with prominence of the sesamoids creaking  pre-ulcerative callus. -Treatment options discussed including all alternatives, risks, and complications -I discussed etiology of his symptoms with them. -Given the plantarflexion in the pressure of the first metatarsal head recommended offloading inserts to take pressure off this area. A prescription for diabetic shoes/insert were given to the patient for finger. Offloading pads were dispensed today as well as a gel cushion. Recommended moisturizer overlying the area daily. Monitor closely for any skin breakdown or signs of infection. Encouraged him to call the office immediately should any occur. Follow-up in 3 weeks or sooner if any problems are to arise. In the meantime call the office with any questions, concerns, change in symptoms.  Celesta Gentile, DPM

## 2015-05-18 ENCOUNTER — Ambulatory Visit: Payer: Medicare Other | Admitting: Podiatry

## 2015-05-25 ENCOUNTER — Ambulatory Visit (INDEPENDENT_AMBULATORY_CARE_PROVIDER_SITE_OTHER): Payer: Medicare Other | Admitting: Family Medicine

## 2015-05-25 ENCOUNTER — Encounter: Payer: Self-pay | Admitting: Family Medicine

## 2015-05-25 VITALS — BP 100/58 | HR 60 | Temp 97.8°F | Resp 16 | Ht 67.0 in | Wt 238.0 lb

## 2015-05-25 DIAGNOSIS — M1000 Idiopathic gout, unspecified site: Secondary | ICD-10-CM

## 2015-05-25 MED ORDER — METHYLPREDNISOLONE ACETATE 40 MG/ML IJ SUSP
60.0000 mg | Freq: Once | INTRAMUSCULAR | Status: AC
  Administered 2015-05-25: 60 mg via INTRAMUSCULAR

## 2015-05-25 MED ORDER — PREDNISONE 20 MG PO TABS: ORAL_TABLET | ORAL | Status: DC

## 2015-05-25 NOTE — Progress Notes (Signed)
Subjective:    patient ID: [name], male    DOB: 1929/07/31, 81 y.o.   MRN: [id number]  HPI 05/02/15 Here for CPE.  Has preulcerative callous on the plantar aspect of the right 1st MTP joint.  Has +2 pitting edema in both legs and bibasilar crackles.  He denies shortness of breath, dyspnea on exertion, orthopnea.  Pneumovax 23 and prevnar 13 are UTD.  Due to age, not due for cancer screening.  At that time, my plan was: Increase lasix to 80 mg pobid (was taking 40 pobid).  Recheck Monday.  Refer to podiatry for preulcerative callous on right foot.  Needs offloading of weight to faciliate healing.  Return for cbc,cmp,flp.  I would not check PSA due to age.  05/08/15 He is here today for follow up.  Swelling is much better. He has lost 7 pounds since when I last saw him. He now has +1 pitting edema in both legs which is closer to the patient's baseline. He denies any chest pain or shortness of breath.  At that time, my plan was: Decrease Lasix to 60 mg by mouth twice a day. Recheck BMP to monitor renal function as well as potassium..  05/25/15 2 days ago, the patient developed pain in his right first MTP joint. The joint is red hot and painful. It is also swollen. It hurts to have the sheet touch his toe at night. He has a history of gout. We have recently increased his dose of diuretic which has triggered gout flares in the past. Past Medical History  Diagnosis Date  . Hypertension   . OA (osteoarthritis)     rt leg  . ED (erectile dysfunction)   . Chronic fatigue   . Gout   . Bowing of leg   . Spinal stenosis   . Prostate cancer ([place])   . CHF (congestive heart failure) ([place])   . Elevated troponin 11/14/2014   Past Surgical History  Procedure Laterality Date  . Back surgery x 2    . Rt rotator cuff repair    . Cervical spine surgery    . Back surgery    . Left heart catheterization with coronary angiogram N/A 11/16/2014    Procedure: LEFT HEART CATHETERIZATION WITH CORONARY  ANGIOGRAM;  Surgeon: Troy Sine, MD;  Location: [facility] CATH LAB;  Service: Cardiovascular;  Laterality: N/A;   Current Outpatient Prescriptions on File Prior to Visit  Medication Sig Dispense Refill  . amLODipine (NORVASC) 5 MG tablet Take 1 tablet (5 mg total) by mouth daily. 30 tablet 11  . aspirin EC 81 MG EC tablet Take 1 tablet (81 mg total) by mouth daily.    . diclofenac sodium (VOLTAREN) 1 % GEL Apply 4 g topically 3 (three) times daily as needed (pain). 100 g 11  . finasteride (PROSCAR) 5 MG tablet Take 1 tablet (5 mg total) by mouth daily. 30 tablet 11  . fluticasone (FLONASE) 50 MCG/ACT nasal spray Place 2 sprays into both nostrils daily. 16 g 11  . furosemide (LASIX) 40 MG tablet Take 1.5 tablets (60 mg total) by mouth 2 (two) times daily. 120 tablet 5  . Menthol-Methyl Salicylate (MUSCLE RUB) 10-15 % CREA Apply 1 application topically 2 (two) times daily.     . metoprolol (LOPRESSOR) 50 MG tablet TAKE ONE (1) TABLET BY MOUTH TWO (2) TIMES DAILY 60 tablet 3  . potassium chloride SA (K-DUR,KLOR-CON) 20 MEQ tablet Take 1 tablet (20 mEq total) by mouth daily.  30 tablet 5  . tamsulosin (FLOMAX) 0.4 MG CAPS capsule Take 0.4 mg by mouth daily.     . ticagrelor (BRILINTA) 90 MG TABS tablet Take 1 tablet (90 mg total) by mouth 2 (two) times daily. 180 tablet 3  . traMADol (ULTRAM) 50 MG tablet TAKE ONE (1) TABLET EVERY 6 HOURS WITH FOOD AS NEEDED 40 tablet 0  . vitamin B-12 (CYANOCOBALAMIN) 1000 MCG tablet Take 1,000 mcg by mouth daily.     No current facility-administered medications on file prior to visit.   No Known Allergies Social History   Social History  . Marital Status: Married    Spouse Name: N/A  . Number of Children: 4  . Years of Education: N/A   Occupational History  .     Social History Main Topics  . Smoking status: Former Research scientist (life sciences)  . Smokeless tobacco: Never Used     Comment: QUIT SMOKING  MANY YEARS AGO"  . Alcohol Use: No  . Drug Use: No  . Sexual Activity:  Not on file   Other Topics Concern  . Not on file   Social History Narrative   Four adopted children.  Lives alone.    Family History  Problem Relation Age of Onset  . Heart failure Mother 43  . Heart attack Mother   . Heart attack Brother      Review of Systems  All other systems reviewed and are negative.      Objective:   Physical Exam  Constitutional: He is oriented to person, place, and time. He appears well-developed and well-nourished. No distress.  HENT:  Head: Normocephalic and atraumatic.  Right Ear: External ear normal.  Left Ear: External ear normal.  Nose: Nose normal.  Mouth/Throat: Oropharynx is clear and moist. No oropharyngeal exudate.  Eyes: Conjunctivae and EOM are normal. Pupils are equal, round, and reactive to light. Right eye exhibits no discharge. Left eye exhibits no discharge. No scleral icterus.  Neck: Normal range of motion. Neck supple. No JVD present. No tracheal deviation present. No thyromegaly present.  Cardiovascular: Normal rate, regular rhythm, normal heart sounds and intact distal pulses.  Exam reveals no gallop and no friction rub.   No murmur heard. Pulmonary/Chest: Effort normal and breath sounds normal. No stridor. No respiratory distress. He has no wheezes. He has no rales. He exhibits no tenderness.  Abdominal: Soft. Bowel sounds are normal. He exhibits no distension and no mass. There is no tenderness. There is no rebound and no guarding.  Musculoskeletal: Normal range of motion. He exhibits edema. He exhibits no tenderness.  Lymphadenopathy:    He has no cervical adenopathy.  Neurological: He is alert and oriented to person, place, and time. He has normal reflexes. No cranial nerve deficit. He exhibits normal muscle tone. Coordination normal.  Skin: Skin is warm. No rash noted. He is not diaphoretic. No erythema. No pallor.  Psychiatric: He has a normal mood and affect. His behavior is normal. Judgment and thought content normal.          Assessment & Plan:  Acute idiopathic gout, unspecified site - Plan: predniSONE (DELTASONE) 20 MG tablet, methylPREDNISolone acetate (DEPO-MEDROL) injection 60 mg  I believe this is gallop. Infection would be less likely. Begin Depo-Medrol 60 mg IM 1 and then begin a prednisone taper pack. Recheck in 24 hours if no better or sooner if worse. Keep infection in the back of my  mind. Fortunately the ulcer on the bottom aspect of his first MTP joint  has completely healed

## 2015-05-30 ENCOUNTER — Ambulatory Visit: Payer: Medicare Other | Admitting: Podiatry

## 2015-05-31 ENCOUNTER — Ambulatory Visit (INDEPENDENT_AMBULATORY_CARE_PROVIDER_SITE_OTHER): Payer: Medicare Other | Admitting: Cardiology

## 2015-05-31 ENCOUNTER — Encounter: Payer: Self-pay | Admitting: Cardiology

## 2015-05-31 VITALS — BP 134/70 | HR 61 | Ht 70.0 in | Wt 243.5 lb

## 2015-05-31 DIAGNOSIS — R5383 Other fatigue: Secondary | ICD-10-CM

## 2015-05-31 DIAGNOSIS — I340 Nonrheumatic mitral (valve) insufficiency: Secondary | ICD-10-CM | POA: Diagnosis not present

## 2015-05-31 NOTE — Patient Instructions (Addendum)
Your physician recommends that you schedule a follow-up appointment in: [place] has recommended you make the following change in your medication: Take Metoprolol 1 tablet daily for 2 days and then STOP, Take 80 mg of lasix for 2 day then go back to taking 60 mg.  Your physician has requested that you have an echocardiogram. Echocardiography is a painless test that uses sound waves to create images of your heart. It provides your doctor with information about the size and shape of your heart and how well your heart's chambers and valves are working. This procedure takes approximately one hour. There are no restrictions for this procedure.  Your physician recommends that you return for lab work in: T4 and T3

## 2015-05-31 NOTE — Progress Notes (Signed)
HPI The patient for followup of HeFPEF.well preserved ejection fraction and narrow complex tachycardia.  Since I last saw him he's had about 3 or 4 months of increasing fatigue. He's had some increased lower extremity swelling. He chronically sleeps with his head elevated and he is not describing any new PND or orthopnea. He does have dyspnea with exertion. He's not avoiding salt as much as I would like. He's not having any chest pressure, neck or arm discomfort. He has had no palpitations like he had previously. He's had no presyncope or syncope.   No Known Allergies  Current Outpatient Prescriptions  Medication Sig Dispense Refill  . amLODipine (NORVASC) 5 MG tablet Take 1 tablet (5 mg total) by mouth daily. 30 tablet 11  . aspirin EC 81 MG EC tablet Take 1 tablet (81 mg total) by mouth daily.    . diclofenac sodium (VOLTAREN) 1 % GEL Apply 4 g topically 3 (three) times daily as needed (pain). 100 g 11  . finasteride (PROSCAR) 5 MG tablet Take 1 tablet (5 mg total) by mouth daily. 30 tablet 11  . fluticasone (FLONASE) 50 MCG/ACT nasal spray Place 2 sprays into both nostrils daily. 16 g 11  . furosemide (LASIX) 40 MG tablet Take 1.5 tablets (60 mg total) by mouth 2 (two) times daily. 120 tablet 5  . Menthol-Methyl Salicylate (MUSCLE RUB) 10-15 % CREA Apply 1 application topically 2 (two) times daily.     . metoprolol (LOPRESSOR) 50 MG tablet TAKE ONE (1) TABLET BY MOUTH TWO (2) TIMES DAILY 60 tablet 3  . potassium chloride SA (K-DUR,KLOR-CON) 20 MEQ tablet Take 1 tablet (20 mEq total) by mouth daily. 30 tablet 5  . predniSONE (DELTASONE) 20 MG tablet 3 tabs poqday 1-2, 2 tabs poqday 3-4, 1 tab poqday 5-6 12 tablet 0  . tamsulosin (FLOMAX) 0.4 MG CAPS capsule Take 0.4 mg by mouth daily.     . ticagrelor (BRILINTA) 90 MG TABS tablet Take 1 tablet (90 mg total) by mouth 2 (two) times daily. 180 tablet 3  . traMADol (ULTRAM) 50 MG tablet TAKE ONE (1) TABLET EVERY 6 HOURS WITH FOOD AS NEEDED 40  tablet 0  . vitamin B-12 (CYANOCOBALAMIN) 1000 MCG tablet Take 1,000 mcg by mouth daily.     No current facility-administered medications for this visit.    Past Medical History  Diagnosis Date  . Hypertension   . OA (osteoarthritis)     rt leg  . ED (erectile dysfunction)   . Chronic fatigue   . Gout   . Bowing of leg   . Spinal stenosis   . Prostate cancer ([place])   . CHF (congestive heart failure) ([place])   . Elevated troponin 11/14/2014    Past Surgical History  Procedure Laterality Date  . Back surgery x 2    . Rt rotator cuff repair    . Cervical spine surgery    . Back surgery    . Left heart catheterization with coronary angiogram N/A 11/16/2014    Procedure: LEFT HEART CATHETERIZATION WITH CORONARY ANGIOGRAM;  Surgeon: Troy Sine, MD;  Location: [facility] CATH LAB;  Service: Cardiovascular;  Laterality: N/A;     ROS: some difficulty swallowing, urinary frequency, leg swelling. Otherwise as stated in the HPI and negative for all other systems.  PHYSICAL EXAM BP 134/70 mmHg  Pulse 61  Ht 5\' 10"  (1.778 m)  Wt 243 lb 8 oz (110.451 kg)  BMI 34.94 kg/m2 GENERAL:  Well appearing HEENT:  Pupils equal round and reactive, fundi not visualized, oral mucosa unremarkable, dentures NECK:  Positive jugular venous distention, waveform within normal limits, carotid upstroke brisk and symmetric, no bruits, no thyromegaly LYMPHATICS:  No cervical, inguinal adenopathy LUNGS:  Clear to auscultation bilaterally BACK:  No CVA tenderness CHEST:  Unremarkable HEART:  PMI not displaced or sustained,S1 and S2 within normal limits, no S3, no S4, no clicks, no rubs, 3/6 apical systolic murmur radiating to the axilla, and no diastolic murmurs ABD:  Flat, positive bowel sounds normal in frequency in pitch, no bruits, no rebound, no guarding, no midline pulsatile mass, no hepatomegaly, no splenomegaly EXT:  2 plus pulses upper but decreased dorsalis pedis and posterior tibialis bilateral, moderate  bilateral lower extremity edema, no cyanosis no clubbing NEURO:  Nonfocal  EKG:  Sinus bradycardia, rate 61, axis within normal limits, intervals within normal limits, no acute ST-T wave changes.  05/31/2015  ASSESSMENT AND PLAN  CAD:  The patient has no new sypmtoms.  No further cardiovascular testing is indicated.  We will continue with aggressive risk reduction and meds as listed.  PULMONARY HTN:  I will follow up with repeat echo.  HTN:  His blood pressure is well controlled.    MR:  I will repeat an echo.   SVT:  He will continue with vagal maneuvers.  I will be weaning his beta blocker and he might have SVT I have to see if his fatigue improves.  If he feels better without the beta blocker and has recurrent SVT and might switch to Cardizem.  EDEMA:  This is moderate. We talked about salt restriction. He can take an extra 20 mg of Lasix twice a day for the next 2 days. He's going to keep his feet elevated.  FATIGUE:  This is his biggest complaint. I am going to check a T3 and T4 and stop his metoprolol weaning off area

## 2015-06-01 LAB — T3: T3, Total: 73.4 ng/dL — ABNORMAL LOW (ref 80.0–204.0)

## 2015-06-01 LAB — T4: T4, Total: 6.8 ug/dL (ref 4.5–12.0)

## 2015-06-04 ENCOUNTER — Emergency Department (HOSPITAL_COMMUNITY): Payer: Medicare Other

## 2015-06-04 ENCOUNTER — Observation Stay (HOSPITAL_COMMUNITY)
Admission: EM | Admit: 2015-06-04 | Discharge: 2015-06-05 | Disposition: A | Discharge status: Home / Self Care | Payer: Medicare Other | Attending: Cardiology | Admitting: Cardiology

## 2015-06-04 ENCOUNTER — Encounter (HOSPITAL_COMMUNITY): Payer: Self-pay | Admitting: *Deleted

## 2015-06-04 DIAGNOSIS — I129 Hypertensive chronic kidney disease with stage 1 through stage 4 chronic kidney disease, or unspecified chronic kidney disease: Secondary | ICD-10-CM | POA: Insufficient documentation

## 2015-06-04 DIAGNOSIS — I2510 Atherosclerotic heart disease of native coronary artery without angina pectoris: Secondary | ICD-10-CM | POA: Diagnosis not present

## 2015-06-04 DIAGNOSIS — Z7982 Long term (current) use of aspirin: Secondary | ICD-10-CM | POA: Diagnosis not present

## 2015-06-04 DIAGNOSIS — I214 Non-ST elevation (NSTEMI) myocardial infarction: Secondary | ICD-10-CM | POA: Insufficient documentation

## 2015-06-04 DIAGNOSIS — Z955 Presence of coronary angioplasty implant and graft: Secondary | ICD-10-CM | POA: Insufficient documentation

## 2015-06-04 DIAGNOSIS — I272 Other secondary pulmonary hypertension: Secondary | ICD-10-CM | POA: Diagnosis not present

## 2015-06-04 DIAGNOSIS — I341 Nonrheumatic mitral (valve) prolapse: Secondary | ICD-10-CM | POA: Insufficient documentation

## 2015-06-04 DIAGNOSIS — I959 Hypotension, unspecified: Secondary | ICD-10-CM | POA: Diagnosis not present

## 2015-06-04 DIAGNOSIS — E872 Acidosis, unspecified: Secondary | ICD-10-CM

## 2015-06-04 DIAGNOSIS — Z8546 Personal history of malignant neoplasm of prostate: Secondary | ICD-10-CM | POA: Diagnosis not present

## 2015-06-04 DIAGNOSIS — E1122 Type 2 diabetes mellitus with diabetic chronic kidney disease: Secondary | ICD-10-CM | POA: Diagnosis not present

## 2015-06-04 DIAGNOSIS — Z9861 Coronary angioplasty status: Secondary | ICD-10-CM

## 2015-06-04 DIAGNOSIS — R600 Localized edema: Secondary | ICD-10-CM | POA: Diagnosis present

## 2015-06-04 DIAGNOSIS — E1165 Type 2 diabetes mellitus with hyperglycemia: Secondary | ICD-10-CM | POA: Insufficient documentation

## 2015-06-04 DIAGNOSIS — Z87891 Personal history of nicotine dependence: Secondary | ICD-10-CM | POA: Diagnosis not present

## 2015-06-04 DIAGNOSIS — E785 Hyperlipidemia, unspecified: Secondary | ICD-10-CM | POA: Insufficient documentation

## 2015-06-04 DIAGNOSIS — N189 Chronic kidney disease, unspecified: Secondary | ICD-10-CM | POA: Diagnosis not present

## 2015-06-04 DIAGNOSIS — R609 Edema, unspecified: Secondary | ICD-10-CM | POA: Diagnosis not present

## 2015-06-04 DIAGNOSIS — R7989 Other specified abnormal findings of blood chemistry: Secondary | ICD-10-CM | POA: Diagnosis not present

## 2015-06-04 DIAGNOSIS — R05 Cough: Secondary | ICD-10-CM | POA: Diagnosis not present

## 2015-06-04 DIAGNOSIS — I471 Supraventricular tachycardia: Secondary | ICD-10-CM | POA: Diagnosis not present

## 2015-06-04 DIAGNOSIS — Z981 Arthrodesis status: Secondary | ICD-10-CM | POA: Insufficient documentation

## 2015-06-04 DIAGNOSIS — I252 Old myocardial infarction: Secondary | ICD-10-CM | POA: Diagnosis not present

## 2015-06-04 DIAGNOSIS — E119 Type 2 diabetes mellitus without complications: Secondary | ICD-10-CM

## 2015-06-04 HISTORY — DX: Supraventricular tachycardia: I47.1

## 2015-06-04 HISTORY — DX: Localized edema: R60.0

## 2015-06-04 HISTORY — DX: Pulmonary hypertension, unspecified: I27.20

## 2015-06-04 HISTORY — DX: Hyperlipidemia, unspecified: E78.5

## 2015-06-04 HISTORY — DX: Atherosclerotic heart disease of native coronary artery without angina pectoris: I25.10

## 2015-06-04 HISTORY — DX: Type 2 diabetes mellitus without complications: E11.9

## 2015-06-04 HISTORY — DX: Supraventricular tachycardia, unspecified: I47.10

## 2015-06-04 LAB — CBC WITH DIFFERENTIAL/PLATELET
BASOS ABS: 0 10*3/uL (ref 0.0–0.1)
Basophils Relative: 0 %
EOS PCT: 0 %
Eosinophils Absolute: 0 10*3/uL (ref 0.0–0.7)
HEMATOCRIT: 47.8 % (ref 39.0–52.0)
Hemoglobin: 15.8 g/dL (ref 13.0–17.0)
LYMPHS PCT: 14 %
Lymphs Abs: 1.5 10*3/uL (ref 0.7–4.0)
MCH: 31.5 pg (ref 26.0–34.0)
MCHC: 33.1 g/dL (ref 30.0–36.0)
MCV: 95.4 fL (ref 78.0–100.0)
Monocytes Absolute: 0.8 10*3/uL (ref 0.1–1.0)
Monocytes Relative: 8 %
Neutro Abs: 8.3 10*3/uL — ABNORMAL HIGH (ref 1.7–7.7)
Neutrophils Relative %: 78 %
PLATELETS: 144 10*3/uL — AB (ref 150–400)
RBC: 5.01 MIL/uL (ref 4.22–5.81)
RDW: 14.3 % (ref 11.5–15.5)
WBC: 10.6 10*3/uL — AB (ref 4.0–10.5)

## 2015-06-04 LAB — URINALYSIS, ROUTINE W REFLEX MICROSCOPIC
BILIRUBIN URINE: NEGATIVE
GLUCOSE, UA: NEGATIVE mg/dL
Hgb urine dipstick: NEGATIVE
KETONES UR: NEGATIVE mg/dL
Leukocytes, UA: NEGATIVE
NITRITE: NEGATIVE
PH: 6.5 (ref 5.0–8.0)
Protein, ur: NEGATIVE mg/dL
SPECIFIC GRAVITY, URINE: 1.015 (ref 1.005–1.030)
Urobilinogen, UA: 1 mg/dL (ref 0.0–1.0)

## 2015-06-04 LAB — COMPREHENSIVE METABOLIC PANEL
ALT: 46 U/L (ref 17–63)
AST: 51 U/L — AB (ref 15–41)
Albumin: 3.3 g/dL — ABNORMAL LOW (ref 3.5–5.0)
Alkaline Phosphatase: 88 U/L (ref 38–126)
Anion gap: 15 (ref 5–15)
BILIRUBIN TOTAL: 1.6 mg/dL — AB (ref 0.3–1.2)
BUN: 25 mg/dL — AB (ref 6–20)
CALCIUM: 9 mg/dL (ref 8.9–10.3)
CO2: 25 mmol/L (ref 22–32)
Chloride: 93 mmol/L — ABNORMAL LOW (ref 101–111)
Creatinine, Ser: 1.53 mg/dL — ABNORMAL HIGH (ref 0.61–1.24)
GFR calc Af Amer: 46 mL/min — ABNORMAL LOW (ref >60)
GFR, EST NON AFRICAN AMERICAN: 40 mL/min — AB (ref >60)
Glucose, Bld: 225 mg/dL — ABNORMAL HIGH (ref 65–99)
POTASSIUM: 4 mmol/L (ref 3.5–5.1)
Sodium: 133 mmol/L — ABNORMAL LOW (ref 135–145)
TOTAL PROTEIN: 7 g/dL (ref 6.5–8.1)

## 2015-06-04 LAB — I-STAT TROPONIN, ED: Troponin i, poc: 0.29 ng/mL (ref 0.00–0.08)

## 2015-06-04 LAB — I-STAT CG4 LACTIC ACID, ED: Lactic Acid, Venous: 4.35 mmol/L (ref 0.5–2.0)

## 2015-06-04 LAB — TROPONIN I: TROPONIN I: 0.4 ng/mL — AB (ref <0.031)

## 2015-06-04 LAB — BRAIN NATRIURETIC PEPTIDE: B NATRIURETIC PEPTIDE 5: 882 pg/mL — AB (ref 0.0–100.0)

## 2015-06-04 MED ORDER — TRAMADOL HCL 50 MG PO TABS: 50.0000 mg | ORAL_TABLET | Freq: Four times a day (QID) | ORAL | Status: DC | PRN

## 2015-06-04 MED ORDER — TAMSULOSIN HCL 0.4 MG PO CAPS
0.4000 mg | ORAL_CAPSULE | Freq: Every day | ORAL | Status: DC
  Administered 2015-06-04: 0.4 mg via ORAL
  Filled 2015-06-04: qty 1

## 2015-06-04 MED ORDER — TICAGRELOR 90 MG PO TABS
90.0000 mg | ORAL_TABLET | ORAL | Status: AC
  Administered 2015-06-04: 90 mg via ORAL
  Filled 2015-06-04: qty 1

## 2015-06-04 MED ORDER — FINASTERIDE 5 MG PO TABS
5.0000 mg | ORAL_TABLET | Freq: Every day | ORAL | Status: DC
  Administered 2015-06-04 – 2015-06-05 (×2): 5 mg via ORAL
  Filled 2015-06-04 (×2): qty 1

## 2015-06-04 MED ORDER — NITROGLYCERIN 0.4 MG SL SUBL: 0.4000 mg | SUBLINGUAL_TABLET | SUBLINGUAL | Status: DC | PRN

## 2015-06-04 MED ORDER — DICLOFENAC SODIUM 1 % TD GEL
4.0000 g | Freq: Three times a day (TID) | TRANSDERMAL | Status: DC | PRN
  Filled 2015-06-04: qty 100

## 2015-06-04 MED ORDER — ASPIRIN 81 MG PO CHEW
324.0000 mg | CHEWABLE_TABLET | ORAL | Status: AC
  Administered 2015-06-04: 324 mg via ORAL
  Filled 2015-06-04: qty 4

## 2015-06-04 MED ORDER — HEPARIN SODIUM (PORCINE) 5000 UNIT/ML IJ SOLN
5000.0000 [IU] | Freq: Three times a day (TID) | INTRAMUSCULAR | Status: DC
  Administered 2015-06-04 – 2015-06-05 (×2): 5000 [IU] via SUBCUTANEOUS
  Filled 2015-06-04 (×2): qty 1

## 2015-06-04 MED ORDER — TICAGRELOR 90 MG PO TABS
90.0000 mg | ORAL_TABLET | Freq: Two times a day (BID) | ORAL | Status: DC
  Administered 2015-06-04 – 2015-06-05 (×2): 90 mg via ORAL
  Filled 2015-06-04 (×2): qty 1

## 2015-06-04 MED ORDER — VITAMIN B-12 1000 MCG PO TABS
1000.0000 ug | ORAL_TABLET | Freq: Every day | ORAL | Status: DC
  Administered 2015-06-04 – 2015-06-05 (×2): 1000 ug via ORAL
  Filled 2015-06-04 (×2): qty 1

## 2015-06-04 MED ORDER — ADENOSINE 6 MG/2ML IV SOLN
INTRAVENOUS | Status: AC
  Filled 2015-06-04: qty 2

## 2015-06-04 MED ORDER — FUROSEMIDE 40 MG PO TABS
60.0000 mg | ORAL_TABLET | Freq: Every day | ORAL | Status: DC
  Administered 2015-06-04 – 2015-06-05 (×2): 60 mg via ORAL
  Filled 2015-06-04 (×3): qty 1

## 2015-06-04 MED ORDER — SODIUM CHLORIDE 0.9 % IV BOLUS (SEPSIS): 1000.0000 mL | Freq: Once | INTRAVENOUS | Status: DC

## 2015-06-04 MED ORDER — ASPIRIN 325 MG PO TABS
325.0000 mg | ORAL_TABLET | ORAL | Status: AC
  Administered 2015-06-04: 325 mg via ORAL
  Filled 2015-06-04: qty 1

## 2015-06-04 MED ORDER — ADENOSINE 6 MG/2ML IV SOLN
INTRAVENOUS | Status: AC | PRN
  Administered 2015-06-04: 12 mg via INTRAVENOUS
  Administered 2015-06-04: 6 mg via INTRAVENOUS

## 2015-06-04 MED ORDER — ADENOSINE 6 MG/2ML IV SOLN
INTRAVENOUS | Status: AC
Start: 2015-06-04 — End: 2015-06-05
  Filled 2015-06-04: qty 4

## 2015-06-04 MED ORDER — FLUTICASONE PROPIONATE (INHAL) 50 MCG/BLIST IN AEPB: 1.0000 | INHALATION_SPRAY | Freq: Two times a day (BID) | RESPIRATORY_TRACT | Status: DC

## 2015-06-04 MED ORDER — BUDESONIDE 0.25 MG/2ML IN SUSP
0.2500 mg | Freq: Two times a day (BID) | RESPIRATORY_TRACT | Status: DC
  Administered 2015-06-04 – 2015-06-05 (×2): 0.25 mg via RESPIRATORY_TRACT
  Filled 2015-06-04 (×2): qty 2

## 2015-06-04 MED ORDER — SODIUM CHLORIDE 0.9 % IV BOLUS (SEPSIS)
1000.0000 mL | Freq: Once | INTRAVENOUS | Status: AC
  Administered 2015-06-04: 1000 mL via INTRAVENOUS

## 2015-06-04 MED ORDER — DILTIAZEM HCL ER COATED BEADS 120 MG PO CP24
120.0000 mg | ORAL_CAPSULE | Freq: Every day | ORAL | Status: DC
  Administered 2015-06-04 – 2015-06-05 (×2): 120 mg via ORAL
  Filled 2015-06-04 (×2): qty 1

## 2015-06-04 MED ORDER — ASPIRIN EC 81 MG PO TBEC
81.0000 mg | DELAYED_RELEASE_TABLET | Freq: Every day | ORAL | Status: DC
  Administered 2015-06-05: 81 mg via ORAL
  Filled 2015-06-04: qty 1

## 2015-06-04 MED ORDER — ASPIRIN 300 MG RE SUPP: 300.0000 mg | RECTAL | Status: AC

## 2015-06-04 MED ORDER — ACETAMINOPHEN 325 MG PO TABS: 650.0000 mg | ORAL_TABLET | ORAL | Status: DC | PRN

## 2015-06-04 MED ORDER — ONDANSETRON HCL 4 MG/2ML IJ SOLN: 4.0000 mg | Freq: Four times a day (QID) | INTRAMUSCULAR | Status: DC | PRN

## 2015-06-04 NOTE — ED Notes (Signed)
Directly after medication administration this RN noted patient to be tachycardic with rate @ 128. EKG collected and given to EDP. EDP to contact cardiology. Patient remains asymptomatic.

## 2015-06-04 NOTE — ED Notes (Addendum)
After speaking with resident and cardiologist, this RN noted patient's heart rate to have returned to 70bpm. EDP informed. No further action advised at this time.

## 2015-06-04 NOTE — ED Notes (Signed)
Pt states symptoms of coughing started last nite and then got sob and then had swelling to abdomen and chest .  Pt appears very weak.

## 2015-06-04 NOTE — ED Provider Notes (Signed)
Arrival Date & Time: 06/04/15 & 1603 History  HPI Limitations: none. Chief Complaint  Patient presents with  . Chest Pain  . Shortness of Breath   HPI [name] is a 81 y.o. male with concerns and abnormal sensation of "an irritation of the throat". I patient states has had before when heart is "fast." The patient brought in by wife for endorsement of this sensation since awaking this morning no symptomatically endorsement per patient at this time. Denies chest pain or shortness of breath.  Even though this is documented by the triage nursing staff I do not agree with her assessment and have spoke with patient extensively to clarify and he again denies chest pain or shortness of breath.  No nausea emesis diaphoresis or abdominal pain.  I reviewed & agree with nursing's documentation on PMHx, PSHx, SHx and FHx. Past Medical History  Diagnosis Date  . Coronary artery disease     a. 11/2014 NSTEMI: DESx 2 placed to LAD and RCA   . Mitral valve prolapse   . Diabetes mellitus, type II ([place])     a. HgA1c 6.8 in 03/2015  . Mitral regurgitation     a. severe wtih flail leaflet by 2D ECHO (06/05/15)  . SVT (supraventricular tachycardia) ([facility])     a. recurrent, usually responsive to vagal manuevers  . HTN (hypertension)   . Prostate cancer ([place])   . Gout   . Spinal stenosis   . Pulmonary HTN ([place])   . Lower extremity edema     a. chronic  . HLD (hyperlipidemia)    Past Surgical History  Procedure Laterality Date  . Cardiac surgery      2 cardiac stents  . Back surgery    . Joint replacement      shoulder   Social History   Social History  . Marital Status: Married    Spouse Name: N/A  . Number of Children: N/A  . Years of Education: N/A   Social History Main Topics  . Smoking status: Former Research scientist (life sciences)  . Smokeless tobacco: None  . Alcohol Use: No  . Drug Use: No  . Sexual Activity: Not Asked   Other Topics Concern  . None   Social History Narrative   No family  history on file.  Review of Systems  Complete ROS obtained and pertinent positive and negatives documented above in HPI. All other ROS negative.  Allergies  Review of patient's allergies indicates no known allergies.  Home Medications   Prior to Admission medications   Medication Sig Start Date End Date Taking? Authorizing Provider  aspirin EC 81 MG tablet Take 81 mg by mouth daily.   Yes Historical Provider, MD  diclofenac sodium (VOLTAREN) 1 % GEL Apply 1 application topically 3 (three) times daily as needed (pain).    Yes Historical Provider, MD  finasteride (PROSCAR) 5 MG tablet Take 5 mg by mouth daily.   Yes Historical Provider, MD  fluticasone (FLONASE) 50 MCG/ACT nasal spray Place 1 spray into both nostrils daily as needed for allergies or rhinitis.   Yes Historical Provider, MD  furosemide (LASIX) 40 MG tablet Take 60 mg by mouth 2 (two) times daily.    Yes Historical Provider, MD  Menthol-Methyl Salicylate (MUSCLE RUB) 10-15 % CREA Apply 1 application topically 2 (two) times daily as needed for muscle pain.   Yes Historical Provider, MD  potassium chloride SA (K-DUR,KLOR-CON) 20 MEQ tablet Take 20 mEq by mouth daily.   Yes Historical Provider,  MD  tamsulosin (FLOMAX) 0.4 MG CAPS capsule Take 0.4 mg by mouth daily.    Yes Historical Provider, MD  ticagrelor (BRILINTA) 90 MG TABS tablet Take 90 mg by mouth 2 (two) times daily.   Yes Historical Provider, MD  traMADol (ULTRAM) 50 MG tablet Take 50 mg by mouth every 6 (six) hours as needed for moderate pain. Take with food   Yes Historical Provider, MD  diltiazem (CARDIZEM CD) 120 MG 24 hr capsule Take 1 capsule (120 mg total) by mouth daily. 06/05/15   Eileen Stanford, PA-C  pravastatin (PRAVACHOL) 40 MG tablet Take 1 tablet (40 mg total) by mouth daily at 6 PM. 06/05/15   Eileen Stanford, PA-C    Physical Exam  BP 130/80 mmHg  Pulse 75  Temp(Src) 98.1 F (36.7 C) (Oral)  Resp 18  Ht 5\' 10"  (1.778 m)  Wt 231 lb 14.4 oz  (105.189 kg)  BMI 33.27 kg/m2  SpO2 97% Physical Exam  Constitutional: He is oriented to person, place, and time. He appears well-developed and well-nourished.  Non-toxic appearance. He has a sickly appearance. He appears ill. He appears distressed.  HENT:  Head: Normocephalic and atraumatic.  Right Ear: External ear normal.  Left Ear: External ear normal.  Eyes: Pupils are equal, round, and reactive to light. No scleral icterus.  Neck: Normal range of motion. Neck supple. JVD present. No tracheal deviation present.  Cardiovascular: Intact distal pulses.   Murmur heard. Pulmonary/Chest: Effort normal and breath sounds normal. No stridor. No respiratory distress. He has no wheezes. He has no rales.  Abdominal: Soft. Bowel sounds are normal. He exhibits no distension. There is no tenderness. There is no rebound and no guarding.  Musculoskeletal: Normal range of motion.  Neurological: He is alert and oriented to person, place, and time. He has normal strength and normal reflexes. No cranial nerve deficit or sensory deficit.  Skin: Skin is warm and dry. No pallor.  Psychiatric: He has a normal mood and affect. His behavior is normal.  Nursing note and vitals reviewed.   ED Course  Procedures Labs Review Labs Reviewed  COMPREHENSIVE METABOLIC PANEL - Abnormal; Notable for the following:    Sodium 133 (*)    Chloride 93 (*)    Glucose, Bld 225 (*)    BUN 25 (*)    Creatinine, Ser 1.53 (*)    Albumin 3.3 (*)    AST 51 (*)    Total Bilirubin 1.6 (*)    GFR calc non Af Amer 40 (*)    GFR calc Af Amer 46 (*)    All other components within normal limits  CBC WITH DIFFERENTIAL/PLATELET - Abnormal; Notable for the following:    WBC 10.6 (*)    Platelets 144 (*)    Neutro Abs 8.3 (*)    All other components within normal limits  BRAIN NATRIURETIC PEPTIDE - Abnormal; Notable for the following:    B Natriuretic Peptide 882.0 (*)    All other components within normal limits  BASIC  METABOLIC PANEL - Abnormal; Notable for the following:    Chloride 100 (*)    Glucose, Bld 163 (*)    BUN 21 (*)    Creatinine, Ser 1.27 (*)    GFR calc non Af Amer 50 (*)    GFR calc Af Amer 58 (*)    All other components within normal limits  CBC - Abnormal; Notable for the following:    Platelets 126 (*)  All other components within normal limits  TROPONIN I - Abnormal; Notable for the following:    Troponin I 0.40 (*)    All other components within normal limits  TROPONIN I - Abnormal; Notable for the following:    Troponin I 0.43 (*)    All other components within normal limits  TROPONIN I - Abnormal; Notable for the following:    Troponin I 0.38 (*)    All other components within normal limits  LIPID PANEL - Abnormal; Notable for the following:    Triglycerides 235 (*)    HDL 31 (*)    VLDL 47 (*)    All other components within normal limits  LACTIC ACID, PLASMA - Abnormal; Notable for the following:    Lactic Acid, Venous 2.1 (*)    All other components within normal limits  HEMOGLOBIN A1C - Abnormal; Notable for the following:    Hgb A1c MFr Bld 7.4 (*)    All other components within normal limits  I-STAT CG4 LACTIC ACID, ED - Abnormal; Notable for the following:    Lactic Acid, Venous 4.35 (*)    All other components within normal limits  I-STAT TROPOININ, ED - Abnormal; Notable for the following:    Troponin i, poc 0.29 (*)    All other components within normal limits  URINE CULTURE  URINALYSIS, ROUTINE W REFLEX MICROSCOPIC (NOT AT [facility])  I-STAT CG4 LACTIC ACID, ED    Imaging Review No results found. Laboratory and Imaging results were personally reviewed by myself and used in the medical decision making of this patient's treatment and disposition.  EKG Interpretation  EKG Interpretation  Date/Time:  Sunday June 04 2015 16:52:07 EDT Ventricular Rate:  63 PR Interval:  140 QRS Duration: 106 QT Interval:  424 QTC Calculation: 434 R Axis:   -97 Text  Interpretation:  Sinus rhythm Multiform ventricular premature complexes LAD, consider left anterior fascicular block Baseline wander in lead(s) V6 Sinus rhythm  (with very short PR)  versus junctional Accelerated idioventricular rhythm Premature ventricular complexes Left axis deviation Abnormal ekg Confirmed by Carmin Muskrat  MD [phone]) on 06/04/2015 4:59:03 PM      MDM  [name] is a 81 y.o. male with H&P as above. ED clinical course as follows:  Due to patient's extreme tachycardia to 130s 140s EKG was obtained after reviewing EMS rhythm strips that was concerning for SVT. In order to obtain rate control patient was trialed in several vagal maneuvers including forced exhalation against closed glottis and Trendelenburg with leg passive raise fluid bolus. These were unsuccessful therefore after intravenous access was obtained patient was administered 6 mg of adenosine while cardiac monitor and defibrillator pads were in place. Patient had no sinus block or evidence of conversion to sinus rhythm. Therefore a repeat dose of 12 mg of adenosine was attempted which resulted in spontaneous conversion to sinus rhythm. EKG obtained after adenosine which reveals ventricular premature complexes with concern for left anterior fascicular block and left axis deviation however there were no ischemic changes concerning for STEMI equivalent. Laboratory work obtained remarkable for elevated lactic acidosis and elevated troponin therefore placed immediate consult to cardiology who came and assessed patient after we discussed patient's ED clinical course laboratory work and imaging. Imaging unremarkable for any acute cardiopulmonary abnormalities and decision was made in conjunction with patient to admit patient for further monitoring and observation I provided patient with intravenous bolus of fluid for clearing of lactic acidosis patient has no further symptoms at  this time and hemodynamically stable and  appropriate for transport to floor.  Clinical Impression:  1. SVT (supraventricular tachycardia) ([place])   2. NSTEMI (non-ST elevated myocardial infarction) ([place])   3. Lactic acidosis    Patient care discussed with Dr. Vanita Panda, who oversaw their evaluation & treatment & voiced agreement. House Officer: Voncille Lo, MD, Emergency Medicine.  Voncille Lo, MD 06/07/15 2139  Carmin Muskrat, MD 06/08/15 Dyann Kief

## 2015-06-04 NOTE — H&P (Signed)
Patient ID: [name] MRN: [id number], DOB/AGE: 1928-11-06   Admit date: 06/04/2015   Primary Physician: [name], MD Primary Cardiologist: Hochrein  Pt. Profile:  81 year old male CAD, CKD, HTN, gout, HFpEF, severe LVH, pHTn (RVSP 65mmHg), and SVT who presents with SVT, positive troponin, and lactic acidosis.  Problem List  Past Medical History  Diagnosis Date  . Coronary artery disease   . Mitral valve prolapse     Past Surgical History  Procedure Laterality Date  . Cardiac surgery      2 cardiac stents  . Back surgery    . Joint replacement      shoulder     Allergies  No Known Allergies  HPI  81 year old male CAD (PCI to LAD/RCA on 11/15/13) , CKD, HTN, gout, HFpEF, severe LVH, pHTn (RVSP 104mmHg), and SVT who presents with SVT, positive troponin, and lactic acidosis.   Mr. Hippe has a long history of SVT. He has been managed with metoprolol and vagal maneuvers, when necessary, have historically terminated SVT. He was just seen by Dr. Percival Spanish on 10/26. He was complaining of fatigue and increased LE edema. He was given a short course of augmented diuretics and his metoprolol was stopped in attempts to improve fatigue. He was scheduled for repeat TTE on 11.2.16. Since then, he developed a productive (white/yellow sputum) cough without fevers, chills, sweats, aches. His wife has had a nagging cough for about 2 weeks. This AM at 3am, he awoke with symptoms consistent with his SVT (chest was going to "bust open", SOB) but also had a chest sensation (radiating right side of chest to left side of chest) that he had a difficult time describing; he says this chest sensation was not typical of prior SVT episodes.  He was unable to terminate the SVT despite attempts. He felt poorly during the day. He did not take his AM meds or eat anything at all. He presented to the ER in the afternoon due to persistent symptoms.   On arrival, he was tachycardic to 140bpm, 93/77,  98% on RA, afebrile. ECG demonstrated a short RP narrow complex tachycardia @ 138bpm. PVC. NSSTTWC. Did not respond to vagal maneuvers or 6mg  of IV adenosine. He did break with 12mg  of IV adenosine. He felt better immediately with resolution of all symptoms. Post conversion ECG demonstrated NSR, LAD, PVCs. Labs were notable for TnI 0.29, lactate 4.35, BNP 882, Cr 1.53, K 4.0. CXR demonstrated borderline cardiomegaly but clear lungs.  On my exam, he felt back to normal. He had his appetite back and wanted to go home.    Home Medications Take 1 tablet (5 mg total) by mouth daily. 30 tablet 11   aspirin EC 81 MG EC tablet Take 1 tablet (81 mg total) by mouth daily.    diclofenac sodium (VOLTAREN) 1 % GEL Apply 4 g topically 3 (three) times daily as needed (pain). 100 g 11  finasteride (PROSCAR) 5 MG tablet Take 1 tablet (5 mg total) by mouth daily. 30 tablet 11  fluticasone (FLONASE) 50 MCG/ACT nasal spray Place 2 sprays into both nostrils daily. 16 g 11  furosemide (LASIX) 40 MG tablet Take 1.5 tablets (60 mg total) by mouth 2 (two) times daily. 120 tablet 5  Menthol-Methyl Salicylate (MUSCLE RUB) 10-15 % CREA Apply 1 application topically 2 (two) times daily.     potassium chloride SA (K-DUR,KLOR-CON) 20 MEQ tablet Take 1 tablet (20 mEq total) by mouth daily. 30 tablet  5  predniSONE (DELTASONE) 20 MG tablet 3 tabs poqday 1-2, 2 tabs poqday 3-4, 1 tab poqday 5-6 12 tablet 0  tamsulosin (FLOMAX) 0.4 MG CAPS capsule Take 0.4 mg by mouth daily.     ticagrelor (BRILINTA) 90 MG TABS tablet Take 1 tablet (90 mg total) by mouth 2 (two) times daily. 180 tablet 3  traMADol (ULTRAM) 50 MG tablet TAKE ONE (1) TABLET EVERY 6 HOURS WITH FOOD AS NEEDED 40 tablet 0  vitamin B-12 (CYANOCOBALAMIN) 1000 MCG tablet Take 1,000 mcg by mouth daily.           Family History  No family history on file.  Social History  Social History   Social History  . Marital  Status: Married    Spouse Name: N/A  . Number of Children: N/A  . Years of Education: N/A   Occupational History  . Not on file.   Social History Main Topics  . Smoking status: Former Research scientist (life sciences)  . Smokeless tobacco: Not on file  . Alcohol Use: No  . Drug Use: No  . Sexual Activity: Not on file   Other Topics Concern  . Not on file   Social History Narrative  . No narrative on file     Review of Systems General:  No chills, fever, night sweats or weight changes.  Cardiovascular:  No chest pain, dyspnea on exertion. Improving edema. + palpitations. Dermatological: No rash, lesions/masses Respiratory: + productive cough. Dyspnea that resolved with SVT conversion Urologic: No hematuria, dysuria Abdominal:   No nausea, vomiting, diarrhea, bright red blood per rectum, melena, or hematemesis Neurologic:  No visual changes, wkns, changes in mental status. All other systems reviewed and are otherwise negative except as noted above.  Physical Exam  Blood pressure 116/72, pulse 72, temperature 98.4 F (36.9 C), temperature source Oral, resp. rate 21, SpO2 99 %.  General: Pleasant, NAD Psych: Normal affect. Neuro: Alert and oriented X 3. Moves all extremities spontaneously. HEENT: Normal  Neck: Supple without bruits or JVD. Lungs:  Resp regular and unlabored. Scattered expiratory wheezes.  Heart: RRR no s3, s4, 3/6 holosystolic murmur across precordium with radiation to axilla Abdomen: Soft, non-tender, non-distended, BS + x 4.  Extremities: No clubbing, cyanosis. 2+ LE edema. DP/PT/Radials 2+ and equal bilaterally.  Labs  Troponin [facility] of Care Test)  Recent Labs  06/04/15 1646  TROPIPOC 0.29*   No results for input(s): CKTOTAL, CKMB, TROPONINI in the last 72 hours. Lab Results  Component Value Date   WBC 10.6* 06/04/2015   HGB 15.8 06/04/2015   HCT 47.8 06/04/2015   MCV 95.4 06/04/2015   PLT 144* 06/04/2015    Recent Labs Lab 06/04/15 1630  NA 133*  K 4.0  CL  93*  CO2 25  BUN 25*  CREATININE 1.53*  CALCIUM 9.0  PROT 7.0  BILITOT 1.6*  ALKPHOS 88  ALT 46  AST 51*  GLUCOSE 225*   No results found for: CHOL, HDL, LDLCALC, TRIG No results found for: DDIMER   Radiology/Studies  Dg Chest Port 1 View  06/04/2015  CLINICAL DATA:  Acute onset of weakness, cough and abdominal swelling. Initial encounter. EXAM: PORTABLE CHEST 1 VIEW COMPARISON:  None. FINDINGS: The lungs are well-aerated. The left costophrenic angle is incompletely imaged on this study. There is no evidence of focal opacification, pleural effusion or pneumothorax. The cardiomediastinal silhouette is borderline enlarged. No acute osseous abnormalities are seen. External pacing pads are noted. Cervical spinal fusion hardware is seen. Degenerative change is  noted about the right acromioclavicular joint. IMPRESSION: Borderline cardiomegaly.  Lungs remain grossly clear. Electronically Signed   By: Garald Balding M.D.   On: 06/04/2015 17:06   TTE 11/16/14 - Left ventricle: The cavity size was normal. Wall thickness was increased in a pattern of severe LVH. Systolic function was normal. The estimated ejection fraction was in the range of 55% to 60%. - Aortic valve: There was mild regurgitation. - Mitral valve: Similar to 2014 there is a clacified mobile mass on anterior leaflet I think this is a chronic flail segment with at least moderate MR Suggest inpateint TEE. There was moderate regurgitation. - Left atrium: The atrium was moderately dilated. - Atrial septum: No defect or patent foramen ovale was identified. - Pulmonary arteries: PA peak pressure: 57 mm Hg (S).  Cath 11/16/14  HEMODYNAMICS: Central Aorta: 140/63 Left Ventricle: 140/11/24  ANGIOGRAPHY: Left main: Angiographically normal vessel which bifurcated into the LAD and left circumflex vessel.  LAD: Large caliber vessel that gave rise to a proximal septal and diagonal branch. The LAD after the diagonal vessel  had a focal 95% stenosis. The remainder of the LAD was free of significant disease and wrapped around the LV apex. Left circumflex: Large caliber normal vessel which gave rise to one major obtuse marginal branch.  Right coronary artery: Large caliber dominant vessel that had a focal 95% proximal stenosis after a moderate sized proximal branch. The vessel ended in the PDA and PLA vessel. Left ventriculography revealed preserved global LV contractility with an ejection fraction of 55-60%. There appeared to be 2-3+ angiographic mitral regurgitation. Percutaneous coronary intervention: The 95% LAD stenosis was reduced to 0% with insertion of a Xience Alpine 3.018 mm DES stent postdilated to 3.21 mm. The 95% proximal RCA stenosis was reduced to 0% with insertion of a Xience Alpine 3.015 mm DES stent postdilated to 3.18 mm.  IMPRESSION: Normal LV function with an ejection fraction of 55-60% and evidence for 2-3+ angiographic mitral regurgitation. Two-vessel coronary obstructive disease with focal 95% mid LAD stenosis and focal proximal 95% RCA stenosis and a large dominant RCA and a normal left circumflex coronary artery. Successful two-vessel percutaneous coronary intervention to the LAD and RCA with both 95% stenoses being reduced to 0% following insertion of Xience Alpine 3.018 mm DES stent in the LAD and 3.015 mm DES stent in the RCA.  ECG  06/04/15 @ 16:08:  short RP narrow complex tachycardia @ 138bpm. PVC. NSSTTWC 06/04/15 @ 16:52: NSR, LAD, PVCs.   ASSESSMENT AND PLAN  81 year old male CAD, CKD, HTN, gout, HFpEF, severe LVH, pHTn (RVSP 77mmHg), and SVT who presents with SVT, positive troponin, and lactic acidosis. He is back in NSR and feels much better. 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 Post conversion ECG is without ischemic changes. He is asymptomatic and has an appetite. He has improving chronic LE edema but clinically is not in pulmonary edema. (Elevated BNP  likely related at least in part to >12hrs of tachycardia). It is most likely that positive trop and elevated lactate represent type II MI and lactic acidosis in the setting of SVT and relative hypotension (SBPs in 90s on arrival to the ER). Severe LVH, CAD, and CKD predispose to elevated trop in setting of hours of SVT. However, based on known CAD, cough (albeit w/o infiltrate on CXR), and age, it is prudent to admit. If trops relatively flat, may be reasonable to forgo ischemic eval  Of note, SVT likely triggered  by resp infection in setting of recent metoprolol discontinuation. Suspect resp infection is viral; no need for abx at this point.   1. cycle troponins; based on pattern, may need to start heparin and consider ischemic eval 2. Start cardizem 120mg  daily as patient is undergoing a trial off of metoprolol. He also has some wheezes on exam, making BB a less optimal agent anyways  3. Stop norvasc to avoid double Ca blockade 4. TTE in AM 5. NPO s/p MN in event ischemic eval is needed 6. Stat ticagreolol/ASA in ER given missed dose this AM.   Signed, Lamar Sprinkles, MD 06/04/2015, 7:08 PM

## 2015-06-05 ENCOUNTER — Encounter: Payer: Self-pay | Admitting: Physician Assistant

## 2015-06-05 ENCOUNTER — Telehealth (HOSPITAL_COMMUNITY): Payer: Self-pay

## 2015-06-05 ENCOUNTER — Observation Stay (HOSPITAL_BASED_OUTPATIENT_CLINIC_OR_DEPARTMENT_OTHER): Payer: Medicare Other

## 2015-06-05 ENCOUNTER — Other Ambulatory Visit: Payer: Self-pay | Admitting: Physician Assistant

## 2015-06-05 ENCOUNTER — Encounter (HOSPITAL_COMMUNITY): Payer: Self-pay | Admitting: Physician Assistant

## 2015-06-05 DIAGNOSIS — E119 Type 2 diabetes mellitus without complications: Secondary | ICD-10-CM

## 2015-06-05 DIAGNOSIS — Z9861 Coronary angioplasty status: Secondary | ICD-10-CM

## 2015-06-05 DIAGNOSIS — I272 Pulmonary hypertension, unspecified: Secondary | ICD-10-CM | POA: Diagnosis present

## 2015-06-05 DIAGNOSIS — R778 Other specified abnormalities of plasma proteins: Secondary | ICD-10-CM

## 2015-06-05 DIAGNOSIS — R600 Localized edema: Secondary | ICD-10-CM | POA: Diagnosis present

## 2015-06-05 DIAGNOSIS — I214 Non-ST elevation (NSTEMI) myocardial infarction: Secondary | ICD-10-CM | POA: Diagnosis not present

## 2015-06-05 DIAGNOSIS — I471 Supraventricular tachycardia: Secondary | ICD-10-CM

## 2015-06-05 DIAGNOSIS — I2510 Atherosclerotic heart disease of native coronary artery without angina pectoris: Secondary | ICD-10-CM

## 2015-06-05 DIAGNOSIS — N189 Chronic kidney disease, unspecified: Secondary | ICD-10-CM | POA: Diagnosis not present

## 2015-06-05 DIAGNOSIS — I129 Hypertensive chronic kidney disease with stage 1 through stage 4 chronic kidney disease, or unspecified chronic kidney disease: Secondary | ICD-10-CM | POA: Diagnosis not present

## 2015-06-05 DIAGNOSIS — R7989 Other specified abnormal findings of blood chemistry: Principal | ICD-10-CM

## 2015-06-05 LAB — LIPID PANEL
CHOL/HDL RATIO: 5.1 ratio
CHOLESTEROL: 158 mg/dL (ref 0–200)
HDL: 31 mg/dL — AB (ref >40)
LDL Cholesterol: 80 mg/dL (ref 0–99)
Triglycerides: 235 mg/dL — ABNORMAL HIGH (ref <150)
VLDL: 47 mg/dL — ABNORMAL HIGH (ref 0–40)

## 2015-06-05 LAB — URINE CULTURE: CULTURE: NO GROWTH

## 2015-06-05 LAB — CBC
HEMATOCRIT: 42.9 % (ref 39.0–52.0)
HEMOGLOBIN: 14.2 g/dL (ref 13.0–17.0)
MCH: 31.6 pg (ref 26.0–34.0)
MCHC: 33.1 g/dL (ref 30.0–36.0)
MCV: 95.5 fL (ref 78.0–100.0)
Platelets: 126 10*3/uL — ABNORMAL LOW (ref 150–400)
RBC: 4.49 MIL/uL (ref 4.22–5.81)
RDW: 14.3 % (ref 11.5–15.5)
WBC: 10.1 10*3/uL (ref 4.0–10.5)

## 2015-06-05 LAB — BASIC METABOLIC PANEL
ANION GAP: 11 (ref 5–15)
BUN: 21 mg/dL — ABNORMAL HIGH (ref 6–20)
CO2: 28 mmol/L (ref 22–32)
Calcium: 9 mg/dL (ref 8.9–10.3)
Chloride: 100 mmol/L — ABNORMAL LOW (ref 101–111)
Creatinine, Ser: 1.27 mg/dL — ABNORMAL HIGH (ref 0.61–1.24)
GFR calc Af Amer: 58 mL/min — ABNORMAL LOW (ref >60)
GFR, EST NON AFRICAN AMERICAN: 50 mL/min — AB (ref >60)
GLUCOSE: 163 mg/dL — AB (ref 65–99)
POTASSIUM: 3.7 mmol/L (ref 3.5–5.1)
Sodium: 139 mmol/L (ref 135–145)

## 2015-06-05 LAB — TROPONIN I
TROPONIN I: 0.38 ng/mL — AB (ref <0.031)
Troponin I: 0.43 ng/mL — ABNORMAL HIGH (ref <0.031)

## 2015-06-05 LAB — LACTIC ACID, PLASMA: Lactic Acid, Venous: 2.1 mmol/L (ref 0.5–2.0)

## 2015-06-05 MED ORDER — DILTIAZEM HCL ER COATED BEADS 120 MG PO CP24: 120.0000 mg | ORAL_CAPSULE | Freq: Every day | ORAL | Status: DC

## 2015-06-05 MED ORDER — FLUTICASONE PROPIONATE 50 MCG/ACT NA SUSP
1.0000 | Freq: Every day | NASAL | Status: DC
  Administered 2015-06-05: 1 via NASAL
  Filled 2015-06-05: qty 16

## 2015-06-05 MED ORDER — PRAVASTATIN SODIUM 40 MG PO TABS: 40.0000 mg | ORAL_TABLET | Freq: Every day | ORAL | Status: DC

## 2015-06-05 NOTE — Care Management Note (Addendum)
Case Management Note  Patient Details  Name: [name] MRN: [id number] Date of Birth: 06-03-1929  Subjective/Objective:     Pt admitted with NSTEMI               Action/Plan:  Pt is from home with wife.  Per pt; [employer] sends [facility] out to visit once a year.  Pt also informed CM that daily he weighs himself and interacts with [facility] regarding fluid status/changes daily through insurance provided [employer] equipment.  CM will continue to monitor for disposition needs.   Expected Discharge Date:                  Expected Discharge Plan:  Home/Self Care  In-House Referral:     Discharge planning Services  CM Consult  Post Acute Care Choice:    Choice offered to:     DME Arranged:    DME Agency:     HH Arranged:    HH Agency:     Status of Service:  In process, will continue to follow  Medicare Important Message Given:    Date Medicare IM Given:    Medicare IM give by:    Date Additional Medicare IM Given:    Additional Medicare Important Message give by:     If discussed at [place] of Stay Meetings, dates discussed:    Additional Comments: Pt will discharge home today with wife, pt was on Brilinta prior to admit, no CM needs determined prior to discharge. Maryclare Labrador, RN 06/05/2015, 11:56 AM

## 2015-06-05 NOTE — Telephone Encounter (Signed)
Left message on voicemail per DPR in reference to upcoming appointment scheduled on 06-07-2015 at 0745 with detailed instructions given per Myocardial Perfusion Study Information Sheet for the test. LM to arrive 15 minutes early, and that it is imperative to arrive on time for appointment to keep from having the test rescheduled. If you need to cancel or reschedule your appointment, please call the office within 24 hours of your appointment. Failure to do so may result in a cancellation of your appointment, and a $50 no show fee. Phone number given for call back for any questions.Oletta Lamas, Zacharee Gaddie A

## 2015-06-05 NOTE — Progress Notes (Signed)
  Echocardiogram 2D Echocardiogram has been performed.  [name] 06/05/2015, 1:53 PM

## 2015-06-05 NOTE — Discharge Summary (Signed)
Discharge Summary   Patient ID: NEWEL OIEN MRN: [id number], DOB/AGE: [birth year] 79 y.o. Admit date: 06/04/2015 D/C date:     06/05/2015  Primary Cardiologist: Dr. Percival Spanish  Principal Problem:   SVT (supraventricular tachycardia) ([place]) Active Problems:   NSTEMI (non-ST elevated myocardial infarction) ([place])   Lactic acidosis   Diabetes mellitus, type II (HCC)   Mitral regurgitation   Coronary artery disease   Pulmonary HTN (HCC)   Lower extremity edema   HLD (hyperlipidemia)   Admission Dates: 06/04/15-06/05/15 Discharge Diagnosis: Recurrent SVT s/p successful DCCV complicated by type II NSTEMI and lactic acidosis  HPI: [name] is a 81 y.o. male with a history of CAD (PCI/DESx2 to LAD/RCA on 11/16/14), CKD, HTN, gout, HFpEF, severe LVH, MR, pHTn (RVSP 28mmHg), and SVT who presents with SVT, positive troponin, and lactic acidosis.    Mr. Mayhall has a long history of SVT. He has been managed with metoprolol and vagal maneuvers, when necessary, have historically terminated SVT. He was just seen by Dr. Percival Spanish on 10/26. He was complaining of fatigue and increased LE edema. He was given a short course of augmented diuretics and his metoprolol was stopped in attempts to improve fatigue. He was scheduled for repeat TTE on 11.2.16. Since then, he developed a productive (white/yellow sputum) cough without fevers, chills, sweats, aches. His wife has had a nagging cough for about 2 weeks. On 06/04/15 3am, he awoke with symptoms consistent with his SVT (chest was going to "bust open", SOB) but also had a chest sensation- radiating right side of chest to left side of chest. He was unable to terminate the SVT despite attempts. He felt poorly during the day. He did not take his AM meds or eat anything at all. He presented to the ER later that afternoon due to persistent symptoms.   On arrival, he was tachycardic to 140bpm, 93/77, 98% on RA, afebrile. ECG demonstrated a short RP narrow  complex tachycardia @ 138bpm. PVC. NSSTTWC. Did not respond to vagal maneuvers or 6mg  of IV adenosine. He did break with 12mg  of IV adenosine. He felt better immediately with resolution of all symptoms. Post conversion ECG demonstrated NSR, LAD, PVCs. Labs were notable for TnI 0.29, lactate 4.35, BNP 882, Cr 1.53, K 4.0. CXR demonstrated borderline cardiomegaly but clear lungs.   Hospital Course  SVT- Note clear retrograde P wave on tail end of QRS during SVT, best seen in V1 and II, consistent with AVNRT. 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 Seen in office by Dr. Percival Spanish on 05/31/15 for LE edema and fatigue. He weaned completely off his Lopressor 50mg  BID over 3 days. He woke up at 3am on 06/04/15 with chest pain and tachycardia. He went in and out of SVT for ~12 hours. Did not respond to vagal maneuvers so he presented to the [facility] ED. It did not respond to vagal removers or 6mg  IV adenosine here so he underwent DCCV which was successful to restore NSR. He was placed on Cardizem CD 120mg  and amlodipine was discontinued to avoid double CA blockade.  -- 2D ECHO ordered by Dr. Percival Spanish on 06/07/15. I have cancelled this and we will obtain while inpatient. -- He can likely be discharged home today. I have arranged an EP consult on 06/13/15 after his nuclear stress test with Dr. Curt Bears.  HFpEF/severe LVH/mod MR/pHTN (RVSP 63mmHg) -- 2D ECHO 11/2014 with EF 55-60% -- Chronic LE edema  but clinically is not in pulmonary edema. (Elevated BNP likely related at least in part to >12hrs of tachycardia). Continue lasix 60mg  po BID -- Repeat 2D ECHO complete but not formally read. Dr. Sallyanne Kuster looked at images and it looks like severe MR with flail leaflet and hyperdynamic LV function. No changes in treatment for now.  Mod MR with possible mass- 2D ECHO in 11/2014 reports: "similar to 2014 there is a clacified mobile mass onanterior leaflet I think this is a  chronic flail segment with atleast moderate MR. Suggest inpateint TEE. There was moderate regurgitation." -- Repeat 2D ECHO complete but not formally read. Dr. Sallyanne Kuster looked at images and it looks like severe MR with flail leaflet and hyperdynamic LV function. No changes in treatment for now (? good mitral clip candidate in future).   Elevated troponin/CAD- mild and flat trend  (0.40-> 0.43--> 0.38) -- Most likely that positive trop and elevated lactate represent type II MI. He had DESx 2 placed to LAD and RCA in 11/2014 for NSTEMI. No chest pain currently. I have arranged an outpatient nuclear stress test on 06/26/15. -- Continue ASA, Brilinta. He is not on a statin. Will place him on pravastatin 40mg  ( atorva will interact with diltiazem). BB discontinued due to fatigue.   Lactic acidosis- likely secondary to SVT and relative hypotension (SBPs in 90s on arrival to the ER). Trending downwards 4.35--> 2.1   DM- I noted hyperglycemia with no previously diagnosis of Diabetes mellitus. However, patient had two charts that needed merging and he had a HgA1c in August of 6.8 which is diagnostic of DM. He is supposed to be following a low carb diet -- He will continue to follow with his primary care  Possibly hypothyroidism- mildly depressed T3 and normal T4 (checked by Dr. Percival Spanish). Last TSH was normal in 11/2014. Will need to be followed by PCP. I have sent summary to Dr. Dennard Schaumann.   HLD- LDL 80. Goal <70 with CAD. We placed him on statin therapy as above   The patient has had an uncomplicated hospital course and is recovering well. He has been seen by Dr. Sallyanne Kuster today and deemed ready for discharge home. All follow-up appointments have been scheduled. Discharge medications are listed below.   Discharge Vitals: Blood pressure 130/80, pulse 75, temperature 98.1 F (36.7 C), temperature source Oral, resp. rate 18, height 5\' 10"  (1.778 m), weight 231 lb 14.4 oz (105.189 kg), SpO2 97 %.  Labs: Lab  Results  Component Value Date   WBC 10.1 06/05/2015   HGB 14.2 06/05/2015   HCT 42.9 06/05/2015   MCV 95.5 06/05/2015   PLT 126* 06/05/2015     Recent Labs Lab 06/04/15 1630 06/05/15 0309  NA 133* 139  K 4.0 3.7  CL 93* 100*  CO2 25 28  BUN 25* 21*  CREATININE 1.53* 1.27*  CALCIUM 9.0 9.0  PROT 7.0  --   BILITOT 1.6*  --   ALKPHOS 88  --   ALT 46  --   AST 51*  --   GLUCOSE 225* 163*    Recent Labs  06/04/15 2154 06/05/15 0309 06/05/15 1014  TROPONINI 0.40* 0.43* 0.38*   Lab Results  Component Value Date   CHOL 158 06/05/2015   HDL 31* 06/05/2015   LDLCALC 80 06/05/2015   TRIG 235* 06/05/2015   No results found for: DDIMER  Diagnostic Studies/Procedures   Dg Chest Port 1 View  06/04/2015  CLINICAL DATA:  Acute onset of weakness, cough  and abdominal swelling. Initial encounter. EXAM: PORTABLE CHEST 1 VIEW COMPARISON:  None. FINDINGS: The lungs are well-aerated. The left costophrenic angle is incompletely imaged on this study. There is no evidence of focal opacification, pleural effusion or pneumothorax. The cardiomediastinal silhouette is borderline enlarged. No acute osseous abnormalities are seen. External pacing pads are noted. Cervical spinal fusion hardware is seen. Degenerative change is noted about the right acromioclavicular joint. IMPRESSION: Borderline cardiomegaly.  Lungs remain grossly clear. Electronically Signed   By: Garald Balding M.D.   On: 06/04/2015 17:06      Discharge Medications     Medication List    STOP taking these medications        amLODipine 5 MG tablet  Commonly known as:  NORVASC      TAKE these medications        aspirin EC 81 MG tablet  Take 81 mg by mouth daily.     diclofenac sodium 1 % Gel  Commonly known as:  VOLTAREN  Apply 1 application topically 3 (three) times daily as needed (pain).     diltiazem 120 MG 24 hr capsule  Commonly known as:  CARDIZEM CD  Take 1 capsule (120 mg total) by mouth daily.      finasteride 5 MG tablet  Commonly known as:  PROSCAR  Take 5 mg by mouth daily.     fluticasone 50 MCG/ACT nasal spray  Commonly known as:  FLONASE  Place 1 spray into both nostrils daily as needed for allergies or rhinitis.     furosemide 40 MG tablet  Commonly known as:  LASIX  Take 60 mg by mouth 2 (two) times daily.     MUSCLE RUB 10-15 % Crea  Apply 1 application topically 2 (two) times daily as needed for muscle pain.     potassium chloride SA 20 MEQ tablet  Commonly known as:  K-DUR,KLOR-CON  Take 20 mEq by mouth daily.     pravastatin 40 MG tablet  Commonly known as:  PRAVACHOL  Take 1 tablet (40 mg total) by mouth daily at 6 PM.     tamsulosin 0.4 MG Caps capsule  Commonly known as:  FLOMAX  Take 0.4 mg by mouth daily.     ticagrelor 90 MG Tabs tablet  Commonly known as:  BRILINTA  Take 90 mg by mouth 2 (two) times daily.     traMADol 50 MG tablet  Commonly known as:  ULTRAM  Take 50 mg by mouth every 6 (six) hours as needed for moderate pain. Take with food        Disposition   The patient will be discharged in stable condition to home.  Follow-up Information    Follow up with [facility] On 06/07/2015.   Why:  @ 7:45 am- nothing to eat after midnight the night before. No coffee.   Contact information:   [place] [address] [phone]      Follow up with Will Meredith Leeds, MD On 06/13/2015.   Specialty:  Cardiology   Why:  @ 10:15am   Contact information:   [place] [address] [phone]       Follow up with [name], MD On 06/16/2015.   Specialty:  Cardiology   Why:  @ 8:45am   Contact information:   [place] [address] [phone]       Follow up with [place],  MD.   Specialty:  Family Medicine   Why:  Please follow up wtih your primary care doctor about your diabetes  mellitus and thyroid ( which may be a little low)   Contact information:   [address] [place] [address] [phone]         Duration of Discharge Encounter: Greater than 30 minutes including physician and PA time.  Mable Fill R PA-C 06/05/2015, 3:23 PM

## 2015-06-05 NOTE — Progress Notes (Signed)
Patient Name: [name] Date of Encounter: 06/05/2015     Active Problems:   NSTEMI (non-ST elevated myocardial infarction) [facility])   SVT (supraventricular tachycardia) (HCC)   Lactic acidosis    SUBJECTIVE  Feeling great. No CP or SOB. Hungry. Feels like he could run around the block. When I brought up the topic of ablation he said this was discussed in years past but there was a 50/50 chance of success and the possibility of LE paralysis and he was not willing to take this risk.   CURRENT MEDS . aspirin EC  81 mg Oral Daily  . budesonide  0.25 mg Nebulization BID  . diltiazem  120 mg Oral Daily  . finasteride  5 mg Oral Daily  . furosemide  60 mg Oral Daily  . heparin  5,000 Units Subcutaneous 3 times per day  . tamsulosin  0.4 mg Oral QPC supper  . ticagrelor  90 mg Oral BID  . vitamin B-12  1,000 mcg Oral Daily    OBJECTIVE  Filed Vitals:   06/04/15 2117 06/05/15 0500 06/05/15 0529 06/05/15 0728  BP: 142/69  125/71   Pulse: 69  64   Temp: 97.4 F (36.3 C)  98 F (36.7 C)   TempSrc: Oral  Oral   Resp: 18  18   Height: 5\' 10"  (1.778 m)     Weight: 232 lb 1.6 oz (105.28 kg) 231 lb 14.4 oz (105.189 kg)    SpO2: 100%  99% 97%    Intake/Output Summary (Last 24 hours) at 06/05/15 0940 Last data filed at 06/05/15 0900  Gross per 24 hour  Intake      0 ml  Output   1475 ml  Net  -1475 ml   Filed Weights   06/04/15 2117 06/05/15 0500  Weight: 232 lb 1.6 oz (105.28 kg) 231 lb 14.4 oz (105.189 kg)    PHYSICAL EXAM  General: Pleasant, NAD. obese Neuro: Alert and oriented X 3. Moves all extremities spontaneously. Psych: Normal affect. HEENT:  Normal  Neck: Supple without bruits or JVD. Lungs:  Resp regular and unlabored, CTA. Heart: RRR no s3, s4, or 3/6 systolic murmur at apex. Abdomen: Soft, non-tender, non-distended, BS + x 4.  Extremities: No clubbing, cyanosis or edema. DP/PT/Radials 2+ and equal bilaterally.  Accessory Clinical  Findings  CBC  Recent Labs  06/04/15 1630 06/05/15 0309  WBC 10.6* 10.1  NEUTROABS 8.3*  --   HGB 15.8 14.2  HCT 47.8 42.9  MCV 95.4 95.5  PLT 144* 709*   Basic Metabolic Panel  Recent Labs  06/04/15 1630 06/05/15 0309  NA 133* 139  K 4.0 3.7  CL 93* 100*  CO2 25 28  GLUCOSE 225* 163*  BUN 25* 21*  CREATININE 1.53* 1.27*  CALCIUM 9.0 9.0   Liver Function Tests  Recent Labs  06/04/15 1630  AST 51*  ALT 46  ALKPHOS 88  BILITOT 1.6*  PROT 7.0  ALBUMIN 3.3*   Cardiac Enzymes  Recent Labs  06/04/15 2154 06/05/15 0309  TROPONINI 0.40* 0.43*   Fasting Lipid Panel  Recent Labs  06/05/15 0309  CHOL 158  HDL 31*  LDLCALC 80  TRIG 235*  CHOLHDL 5.1    TELE  NSR with freq PVCS  Radiology/Studies  Dg Chest Port 1 View  06/04/2015  CLINICAL DATA:  Acute onset of weakness, cough and abdominal swelling. Initial encounter. EXAM: PORTABLE CHEST 1 VIEW COMPARISON:  None. FINDINGS: The lungs are well-aerated. The left  costophrenic angle is incompletely imaged on this study. There is no evidence of focal opacification, pleural effusion or pneumothorax. The cardiomediastinal silhouette is borderline enlarged. No acute osseous abnormalities are seen. External pacing pads are noted. Cervical spinal fusion hardware is seen. Degenerative change is noted about the right acromioclavicular joint. IMPRESSION: Borderline cardiomegaly.  Lungs remain grossly clear. Electronically Signed   By: Garald Balding M.D.   On: 06/04/2015 17:06    2D ECHO: 11/16/2014 LV EF: 55% -60% Study Conclusions - Left ventricle: The cavity size was normal. Wall thickness was increased in a pattern of severe LVH. Systolic function was normal. The estimated ejection fraction was in the range of 55% to 60%. - Aortic valve: There was mild regurgitation. - Mitral valve: Similar to 2014 there is a clacified mobile mass on anterior leaflet I think this is a chronic flail segment with  at least moderate MR. Suggest inpateint TEE. There was moderate regurgitation. - Left atrium: The atrium was moderately dilated. - Atrial septum: No defect or patent foramen ovale was identified. - Pulmonary arteries: PA peak pressure: 57 mm Hg (S).   ASSESSMENT AND PLAN  81 year old male CAD (PCI/DESx2 to LAD/RCA on 11/16/14), CKD, HTN, gout, HFpEF, severe LVH, MR, pHTn (RVSP 25mmHg), and SVT who presents with SVT, positive troponin, and lactic acidosis.   SVT- He has a long history of this (15 years) and it usually terminates with vagal maneuvers. Seen in office by Dr. Percival Spanish on 05/31/15 for LE edema and fatigue. He weaned completely off his Lopressor 50mg  BID over 2 days. He woke up at 3am on 06/04/15 with chest pain and tachycardia. He went in and out of SVT for ~12 hours. Did not respond to vagal maneuvers so he presented to [facility] ED. It did not respond to vagal removers or 6mg  IV adenosine here so he underwent DCCV which was successful to restore NSR. He was placed on Cardizem CD 120mg  and amlodipine was discontinued to avoid double CA blockade.  --2D ECHO ordered by Dr. Percival Spanish on 06/07/15. I have cancelled this and we will obtain while inpatient. -- He can likely be discharged home today. I have arranged an EP consult on 06/13/15 after his nuclear stress test with Dr. Curt Bears.   HFpEF/severe LVH/mod MR/pHTN (RVSP 25mmHg) -- 2D ECHO 11/2014 with EF 55-60% -- Chronic LE edema but clinically is not in pulmonary edema. (Elevated BNP likely related at least in part to >12hrs of tachycardia). Continue lasix 60mg  qd. -- Will repeat 2D ECHO as above.   Mod MR with possible mass- 2D ECHO in 11/2014 reports: "similar to 2014 there is a clacified mobile mass onanterior leaflet I think this is a chronic flail segment with atleast moderate MR. Suggest inpateint TEE. There was moderate regurgitation." -- Repeat 2D ECHO as above.  Elevated troponin/CAD- most likely that positive trop and elevated  lactate represent type II MI. He had DESx 2 placed to LAD and RCA in 11/2014 for NSTEMI. No chest pain currently. I have arranged an outpatient nuclear stress test on 06/26/15. -- Continue ASA, Brilinta. He is not on a statin. Will place him on pravastatin 40mg  ( atorva will interact with diltiazem). BB discontinued due to fatigue.   Lactic acidosis- likely secondary to SVT and relative hypotension (SBPs in 90s on arrival to the ER). Trending downwards 4.35--> 2.1   Hypergylcemia- I do not see a previous diagnosis of DM. I will obtain a HgA1c  Signed, THOMPSON, KATHRYN R PA-C  Pager [phone]  I have seen and examined the patient along with Angelena Form R PA-C.  I have reviewed the chart, notes and new data.  I agree with PA's note.  Key new complaints: feels well now Key examination changes: no CHF Key new findings / data: marginal troponin increase attributable to tachycardia. Note clear retrograde P wave on tail end of QRS during SVT, best seen in V1 and II, consistent with AVNRT.  PLAN: Started on diltiazem rather than beta blocker which seemed to cause fatigue. Refer for evaluation for AVN slow pathway modification. Outpatient nuclear stress test. Echo today (he was scheduled to come back tomorrow for outpatient echo). DC home after echo.  Sanda Klein, MD, [place] [phone] 06/05/2015, 12:03 PM

## 2015-06-05 NOTE — Discharge Instructions (Signed)
Paroxysmal Supraventricular Tachycardia Paroxysmal supraventricular tachycardia (PSVT) is a type of abnormal heart rhythm. It causes your heart to beat very quickly and then suddenly stop beating so quickly. A normal heart rate is 60-100 beats per minute. During an episode of PSVT, your heart rate may be 150-250 beats per minute. This can make you feel light-headed and short of breath. An episode of PSVT can be frightening. It is usually not dangerous. The heart has four chambers. All chambers need to work together for the heart to beat effectively. A normal heartbeat usually starts in the right upper chamber of the heart (atrium) when an area (sinoatrial node) puts out an electrical signal that spreads to the other chambers. People with PSVT may have abnormal electrical pathways, or they may have other areas in the upper chambers that send out electrical signals. The result is a very rapid heartbeat. When your heart beats very quickly, it does not have time to fill completely with blood. When PSVT happens often or it lasts for long periods, it can lead to heart weakness and failure. Most people with PSVT do not have any other heart disease. CAUSES Abnormal electrical activity in the heart causes PSVT. It is not known why some people get PSVT and others do not. RISK FACTORS You may be more likely to have PSVT if:  You are 20-30 years old.  You are a woman. Other factors that may increase your chances of an attack include:  Stress.  Being tired.  Smoking.  Stimulant drugs.  Alcoholic drinks.  Caffeine.  Pregnancy. SIGNS AND SYMPTOMS A mild episode of PSVT may cause no symptoms. If you do have signs and symptoms, they may include:  A pounding heart.  Feeling of skipped heartbeats (palpitations).  Weakness.  Shortness of breath.  Tightness or pain in your chest.  Light-headedness.  Anxiety.  Dizziness.  Sweating.  Nausea.  A fainting spell. DIAGNOSIS Your health care  provider may suspect PSVT if you have symptoms that come and go. The health care provider will do a physical exam. If you are having an episode during the exam, the health care provider may be able to diagnose PSVT by listening to your heart and feeling your pulse. Tests may also be done, including:  An electrical study of your heart (electrocardiogram, or ECG).  A test in which you wear a portable ECG monitor all day (Holter monitor) or for several days (event monitor).  A test that involves taking an image of your heart using sound waves (echocardiogram) to rule out other causes of a fast heart rate. TREATMENT You may not need treatment if episodes of PSVT do not happen often or if they do not cause symptoms. If PSVT episodes do cause symptoms, your health care provider may first suggest trying a self-treatment called vagus nerve stimulation. The vagus nerve extends down from the brain. It regulates certain body functions. Stimulating this nerve can slow down the heart. Your health care provider can teach you ways to do this. You may need to try a few ways to find what works best for you. Options include:  Holding your breath and pushing, as though you are having a bowel movement.  Massaging an area on one side of your neck below your jaw.  Bending forward with your head between your legs.  Bending forward with your head between your legs and coughing.  Massaging your eyeballs with your eyes closed. If vagus nerve stimulation does not work, other treatment options include:    Medicines to prevent an attack.  Being treated in the hospital with medicine or electric shock to stop an attack (cardioversion). This treatment can include:  Getting medicine through an IV line.  Having a small electric shock delivered to your heart. You will be given medicine to make you sleep through this procedure.  If you have frequent episodes with symptoms, you may need a procedure to get rid of the faulty  areas of your heart (radiofrequency ablation) and end the episodes of PSVT. In this procedure:  A long, thin tube (catheter) is passed through one of your veins into your heart.  Energy directed through the catheter eliminates the areas of your heart that are causing abnormal electric stimulation. HOME CARE INSTRUCTIONS  Take medicines only as directed by your health care provider.  Do not use caffeine in any form if caffeine triggers episodes of PSVT. Otherwise, consume caffeine in moderation. This means no more than a few cups of coffee or the equivalent each day.  Do not drink alcohol if alcohol triggers episodes of PSVT. Otherwise, limit alcohol intake to no more than 1 drink per day for nonpregnant women and 2 drinks per day for men. One drink equals 12 ounces of beer, 5 ounces of wine, or 1 ounces of hard liquor.  Do not use any tobacco products, including cigarettes, chewing tobacco, or electronic cigarettes. If you need help quitting, ask your health care provider.  Try to get at least 7 hours of sleep each night.  Find healthy ways to manage stress.  Perform vagus nerve stimulation as directed by your health care provider.  Maintain a healthy weight.  Get some exercise on most days. Ask your health care provider to suggest some good activities for you. SEEK MEDICAL CARE IF:  You are having episodes of PSVT more often, or they are lasting longer.  Vagus nerve stimulation is no longer helping.  You have new symptoms during an episode. SEEK IMMEDIATE MEDICAL CARE IF:  You have chest pain or trouble breathing.  You have an episode of PSVT that has lasted longer than 20 minutes.  You have passed out from an episode of PSVT. These symptoms may represent a serious problem that is an emergency. Do not wait to see if the symptoms will go away. Get medical help right away. Call your local emergency services (911 in the [place] Do not drive yourself to the hospital.   This  information is not intended to replace advice given to you by your health care provider. Make sure you discuss any questions you have with your health care provider.   Document Released: 07/22/2005 Document Revised: 08/12/2014 Document Reviewed: 12/30/2013 Elsevier Interactive Patient Education 2016 [employer]  

## 2015-06-05 NOTE — Progress Notes (Signed)
Inpatient Diabetes Program Recommendations  AACE/ADA: New Consensus Statement on Inpatient Glycemic Control (2015)  Target Ranges:  Prepandial:   less than 140 mg/dL      Peak postprandial:   less than 180 mg/dL (1-2 hours)      Critically ill patients:  140 - 180 mg/dL   Review of Glycemic Control  Inpatient Diabetes Program Recommendations:    Lab glucose elevated.  Consider ordering CBGs TIDHS and start Novolog per Glycemic Control order-set if needed. Thank you  Raoul Pitch BSN, RN,CDE Inpatient Diabetes Coordinator [phone] (team pager)

## 2015-06-06 ENCOUNTER — Telehealth: Payer: Self-pay | Admitting: Cardiology

## 2015-06-06 LAB — HEMOGLOBIN A1C
Hgb A1c MFr Bld: 7.4 % — ABNORMAL HIGH (ref 4.8–5.6)
MEAN PLASMA GLUCOSE: 166 mg/dL

## 2015-06-06 NOTE — Telephone Encounter (Signed)
D/C phone call .appointment on 06/16/15 at 8:45am w/Dr. Percival Spanish at the [facility] office .   Thanks

## 2015-06-06 NOTE — Telephone Encounter (Signed)
TOC call to patient.He stated he was doing good.No complaints.He understands discharge instructions and is taking medications as prescribed.Advised to keep post hospital appointment with Dr.Hochrein 06/16/15 at 8:45 am.

## 2015-06-07 ENCOUNTER — Other Ambulatory Visit (HOSPITAL_COMMUNITY): Payer: Medicare Other

## 2015-06-07 ENCOUNTER — Ambulatory Visit (HOSPITAL_COMMUNITY): Payer: Medicare Other | Attending: Cardiology

## 2015-06-07 DIAGNOSIS — R9439 Abnormal result of other cardiovascular function study: Secondary | ICD-10-CM | POA: Diagnosis not present

## 2015-06-07 DIAGNOSIS — I2510 Atherosclerotic heart disease of native coronary artery without angina pectoris: Secondary | ICD-10-CM

## 2015-06-07 DIAGNOSIS — R778 Other specified abnormalities of plasma proteins: Secondary | ICD-10-CM

## 2015-06-07 DIAGNOSIS — R5383 Other fatigue: Secondary | ICD-10-CM | POA: Insufficient documentation

## 2015-06-07 DIAGNOSIS — R0609 Other forms of dyspnea: Secondary | ICD-10-CM | POA: Insufficient documentation

## 2015-06-07 DIAGNOSIS — R7989 Other specified abnormal findings of blood chemistry: Secondary | ICD-10-CM | POA: Insufficient documentation

## 2015-06-07 DIAGNOSIS — I517 Cardiomegaly: Secondary | ICD-10-CM | POA: Insufficient documentation

## 2015-06-07 DIAGNOSIS — I10 Essential (primary) hypertension: Secondary | ICD-10-CM | POA: Insufficient documentation

## 2015-06-07 LAB — MYOCARDIAL PERFUSION IMAGING
CHL CUP NUCLEAR SDS: 2
CHL CUP NUCLEAR SSS: 6
LHR: 0.3
LV sys vol: 65 mL
LVDIAVOL: 127 mL
NUC STRESS TID: 0.97
Peak HR: 90 {beats}/min
Rest HR: 78 {beats}/min
SRS: 4

## 2015-06-07 MED ORDER — TECHNETIUM TC 99M SESTAMIBI GENERIC - CARDIOLITE
11.0000 | Freq: Once | INTRAVENOUS | Status: AC | PRN
  Administered 2015-06-07: 11 via INTRAVENOUS

## 2015-06-07 MED ORDER — REGADENOSON 0.4 MG/5ML IV SOLN
0.4000 mg | Freq: Once | INTRAVENOUS | Status: AC
  Administered 2015-06-07: 0.4 mg via INTRAVENOUS

## 2015-06-07 MED ORDER — TECHNETIUM TC 99M SESTAMIBI GENERIC - CARDIOLITE
32.9000 | Freq: Once | INTRAVENOUS | Status: AC | PRN
  Administered 2015-06-07: 32.9 via INTRAVENOUS

## 2015-06-08 ENCOUNTER — Encounter: Payer: Self-pay | Admitting: Physician Assistant

## 2015-06-12 NOTE — Progress Notes (Signed)
Electrophysiology Office Note   Date:  06/13/2015   ID:  [name], DOB 1928/11/22, MRN [id number]  PCP:  [name], MD  Cardiologist:  [name] Primary Electrophysiologist: Billyjoe Go Meredith Leeds, MD    No chief complaint on file.    History of Present Illness: [name] is a 81 y.o. male who presents today for electrophysiology evaluation.   He has a history of HeFPEF and a narrow complex tachycardia.  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 He had complaints that his chest felt like he was going to bust open and had shortness of breath. He also had a sensation in the right side of his chest radiating to the left side he was unable terminated the SVT due to multiple attempts at vagal maneuvers. He was recently taken off of his beta blocker due to fatigue. His heart rate in the emergency room was 140 bpm and required cardioversion to break.  He was started on Cardizem 120 mg at hospital discharge.   He has continued to have complaints of palpitations the most recent being on Sunday. He says that it lasted 15 minutes and he was able to break them with vagal maneuvers.   Today, he denies symptoms of palpitations, chest pain, shortness of breath, orthopnea, PND, lower extremity edema, claudication, dizziness, presyncope, syncope, bleeding, or neurologic sequela. The patient is tolerating medications without difficulties and is otherwise without complaint today.    Past Medical History  Diagnosis Date  . Coronary artery disease     a. 11/2014 NSTEMI: DESx 2 placed to LAD and RCA   . Mitral valve prolapse   . Diabetes mellitus, type II ([place])     a. HgA1c 6.8 in 03/2015  . Mitral regurgitation     a. severe wtih flail leaflet by 2D ECHO (06/05/15)  . SVT (supraventricular tachycardia) (HCC)     a. recurrent, usually responsive to vagal manuevers  . HTN (hypertension)   . Pulmonary HTN ([place])     . Lower extremity edema     a. chronic  . HLD (hyperlipidemia)   . Hypertension   . OA (osteoarthritis)   . ED (erectile dysfunction)   . Chronic fatigue   . Gout   . Spinal stenosis   . Prostate cancer ([place])   . CAD (coronary artery disease)    Past Surgical History  Procedure Laterality Date  . Cardiac surgery      2 cardiac stents  . Joint replacement      shoulder  . Back surgery x 2    . Rt rotator cuff repair    . Cervical spine surgery    . Back surgery    . Left heart catheterization with coronary angiogram N/A 11/16/2014    Procedure: LEFT HEART CATHETERIZATION WITH CORONARY ANGIOGRAM;  Surgeon: Troy Sine, MD;  [facility]: [facility] C[facility] LAB;  Service: Cardiovascular;  Laterality: N/A;     Current Outpatient Prescriptions  Medication Sig Dispense Refill  . amLODipine (NORVASC) 5 MG tablet Take 1 tablet (5 mg total) by mouth daily. 30 tablet 11  . diclofenac sodium (VOLTAREN) 1 % GEL Apply 1 application topically 3 (three) times daily as needed (pain).     Marland Kitchen diltiazem (CARDIZEM CD) 120 MG 24 hr capsule Take 1 capsule (120 mg total) by mouth daily. 30 capsule 6  . finasteride (PROSCAR) 5 MG tablet Take 5 mg by mouth daily.    Marland Kitchen  fluticasone (FLONASE) 50 MCG/ACT nasal spray Place 1 spray into both nostrils daily as needed for allergies or rhinitis.    . furosemide (LASIX) 40 MG tablet Take 1.5 tablets (60 mg total) by mouth 2 (two) times daily. 120 tablet 5  . Menthol-Methyl Salicylate (MUSCLE RUB) 10-15 % CREA Apply 1 application topically 2 (two) times daily.     . metoprolol (LOPRESSOR) 50 MG tablet TAKE ONE (1) TABLET BY MOUTH TWO (2) TIMES DAILY 60 tablet 3  . potassium chloride SA (K-DUR,KLOR-CON) 20 MEQ tablet Take 1 tablet (20 mEq total) by mouth daily. 30 tablet 5  . pravastatin (PRAVACHOL) 40 MG tablet Take 1 tablet (40 mg total) by mouth daily at 6 PM. 30 tablet 11  . predniSONE (DELTASONE) 20 MG tablet 3 tabs poqday 1-2, 2 tabs poqday 3-4, 1 tab poqday 5-6 12  tablet 0  . tamsulosin (FLOMAX) 0.4 MG CAPS capsule Take 0.4 mg by mouth daily.     . ticagrelor (BRILINTA) 90 MG TABS tablet Take 1 tablet (90 mg total) by mouth 2 (two) times daily. 180 tablet 3  . traMADol (ULTRAM) 50 MG tablet Take 50 mg by mouth every 6 (six) hours as needed for moderate pain. Take with food    . vitamin B-12 (CYANOCOBALAMIN) 1000 MCG tablet Take 1,000 mcg by mouth daily.    Marland Kitchen aspirin EC 81 MG tablet Take 81 mg by mouth daily.     No current facility-administered medications for this visit.    Allergies:   Review of patient's allergies indicates no known allergies.   Social History:  The patient  reports that he has quit smoking. He has never used smokeless tobacco. He reports that he does not drink alcohol or use illicit drugs.   Family History:  The patient's family history includes Heart attack in his brother and mother; Heart failure (age of onset: 59) in his mother.    ROS:  Please see the history of present illness.   Otherwise, review of systems is positive for fatigue, leg swelling, palpitations, cough, muscle pain, dizziness.   All other systems are reviewed and negative.    PHYSICAL EXAM: VS:  BP 126/70 mmHg  Pulse 79  Ht 5\' 10"  (1.778 m)  Wt 232 lb 12.8 oz (105.597 kg)  BMI 33.40 kg/m2 , BMI Body mass index is 33.4 kg/(m^2). GEN: Well nourished, well developed, in no acute distress HEENT: normal Neck: no JVD, carotid bruits, or masses Cardiac: RRR; no murmurs, rubs, or gallops,no edema  Respiratory:  clear to auscultation bilaterally, normal work of breathing GI: soft, nontender, nondistended, + BS MS: no deformity or atrophy Skin: warm and dry Neuro:  Strength and sensation are intact Psych: euthymic mood, full affect  EKG:  EKG is not ordered today. The ekg ordered today shows narrow complex short RP tachycardia. EKG in sinus rhythm shows no acute abnormalities were about unit. Recent Labs: 11/14/2014: Magnesium 2.1; TSH 0.676 06/04/2015: ALT  46; B Natriuretic Peptide 882.0* 06/05/2015: BUN 21*; Creatinine, Ser 1.27*; Hemoglobin 14.2; Platelets 126*; Potassium 3.7; Sodium 139    Lipid Panel     Component Value Date/Time   CHOL 158 06/05/2015 0309   TRIG 235* 06/05/2015 0309   HDL 31* 06/05/2015 0309   CHOLHDL 5.1 06/05/2015 0309   VLDL 47* 06/05/2015 0309   LDLCALC 80 06/05/2015 0309     Wt Readings from Last 3 Encounters:  06/13/15 232 lb 12.8 oz (105.597 kg)  06/07/15 243 lb (110.224 kg)  06/05/15  231 lb 14.4 oz (105.189 kg)      Other studies Reviewed: Additional studies/ records that were reviewed today include: MPI 06/07/15 Review of the above records today demonstrates:   Nuclear stress EF: 49%. The LV function is mildly depressed  There was no ST segment deviation noted during stress.  Defect 1: There is a medium defect present in the basal inferior and mid inferolateral location.  The images are consistent with a previous mid inferiolateral MI with a small area of ischemia in the inferior base.  This is an intermediate risk study.  TTE 06/05/15 - Left ventricle: The cavity size was normal. There was moderate concentric hypertrophy. Systolic function was hyperdynamic. The estimated ejection fraction was in the range of 75% to 80%. Wall motion was normal; there were no regional wall motion abnormalities. Features are consistent with a pseudonormal left ventricular filling pattern, with concomitant abnormal relaxation and increased filling pressure (grade 2 diastolic dysfunction). - Aortic valve: There was mild regurgitation. - Mitral valve: Calcified annulus. Mildly thickened leaflets . Flail motion involving a segment of the anterior leaflet. This causes the appearance of a &quot;mass&quot; on the anterior leaflet. There was severe regurgitation directed eccentrically and posteriorly. Severe regurgitation is suggested by pulmonary vein systolic flow reversal. - Left atrium:  The atrium was severely dilated. - Right ventricle: The cavity size was mildly dilated. Wall thickness was normal. - Right atrium: The atrium was moderately to severely dilated. - Tricuspid valve: There was moderate regurgitation. - Pulmonic valve: There was moderate regurgitation. - Pulmonary arteries: Systolic pressure was moderately increased. PA peak pressure: 46 mm Hg (S). ASSESSMENT AND PLAN:  1.  SVT: patient was recently admitted with SVT which required cardioversion and did not convert with adenosine. He was started on Cardizem upon leaving the hospital and has since had repeat episodes of tachycardia. He feels that he would like a trial of Cardizem and using vagal maneuvers to see if this helps with his symptoms. If his symptoms are not helped by this we Manasi Dishon see him back in 3 months and potentially schedule ablation at that time. He Prince Couey call us if he has any further issues.     Current medicines are reviewed at length with the patient today.   The patient does not have concerns regarding his medicines.  The following changes were made today:  none  Labs/ tests ordered today include:  No orders of the defined types were placed in this encounter.     Disposition:   FU with Ehab Humber 3 months  Signed, Dorrie Cocuzza Meredith Leeds, MD  06/13/2015 10:39 AM     [facility] [address] [place] [address] [phone] (office) [phone] (fax)

## 2015-06-13 ENCOUNTER — Ambulatory Visit (INDEPENDENT_AMBULATORY_CARE_PROVIDER_SITE_OTHER): Payer: Medicare Other | Admitting: Cardiology

## 2015-06-13 ENCOUNTER — Encounter: Payer: Self-pay | Admitting: Cardiology

## 2015-06-13 VITALS — BP 126/70 | HR 79 | Ht 70.0 in | Wt 232.8 lb

## 2015-06-13 DIAGNOSIS — I471 Supraventricular tachycardia: Secondary | ICD-10-CM

## 2015-06-13 DIAGNOSIS — E119 Type 2 diabetes mellitus without complications: Secondary | ICD-10-CM | POA: Diagnosis not present

## 2015-06-13 NOTE — Patient Instructions (Signed)
Medication Instructions:  Your physician recommends that you continue on your current medications as directed. Please refer to the Current Medication list given to you today.  Labwork: None ordered  Testing/Procedures: None ordered  Follow-Up: Your physician recommends that you schedule a follow-up appointment in: 3 months with Dr. Curt Bears.   Any Other Special Instructions Will Be Listed Below (If Applicable).  If you need a refill on your cardiac medications before your next appointment, please call your pharmacy.  Thank you for choosing [facility]!!   Trinidad Curet, RN [phone]

## 2015-06-16 ENCOUNTER — Encounter: Payer: Self-pay | Admitting: Cardiology

## 2015-06-16 ENCOUNTER — Ambulatory Visit (INDEPENDENT_AMBULATORY_CARE_PROVIDER_SITE_OTHER): Payer: Medicare Other | Admitting: Cardiology

## 2015-06-16 ENCOUNTER — Encounter: Payer: Self-pay | Admitting: Cardiovascular Disease

## 2015-06-16 VITALS — BP 130/70 | HR 73 | Ht 69.0 in | Wt 235.0 lb

## 2015-06-16 DIAGNOSIS — D689 Coagulation defect, unspecified: Secondary | ICD-10-CM

## 2015-06-16 DIAGNOSIS — I348 Other nonrheumatic mitral valve disorders: Secondary | ICD-10-CM | POA: Diagnosis not present

## 2015-06-16 DIAGNOSIS — I340 Nonrheumatic mitral (valve) insufficiency: Secondary | ICD-10-CM

## 2015-06-16 DIAGNOSIS — Z01818 Encounter for other preprocedural examination: Secondary | ICD-10-CM | POA: Diagnosis not present

## 2015-06-16 LAB — CBC
HCT: 43 % (ref 39.0–52.0)
Hemoglobin: 14.4 g/dL (ref 13.0–17.0)
MCH: 31.4 pg (ref 26.0–34.0)
MCHC: 33.5 g/dL (ref 30.0–36.0)
MCV: 93.9 fL (ref 78.0–100.0)
MPV: 11.1 fL (ref 8.6–12.4)
Platelets: 117 10*3/uL — ABNORMAL LOW (ref 150–400)
RBC: 4.58 MIL/uL (ref 4.22–5.81)
RDW: 14.1 % (ref 11.5–15.5)
WBC: 8.9 10*3/uL (ref 4.0–10.5)

## 2015-06-16 LAB — BASIC METABOLIC PANEL
BUN: 14 mg/dL (ref 7–25)
CALCIUM: 8.6 mg/dL (ref 8.6–10.3)
CO2: 26 mmol/L (ref 20–31)
Chloride: 103 mmol/L (ref 98–110)
Creat: 1.05 mg/dL (ref 0.70–1.11)
Glucose, Bld: 124 mg/dL — ABNORMAL HIGH (ref 65–99)
POTASSIUM: 3.6 mmol/L (ref 3.5–5.3)
SODIUM: 142 mmol/L (ref 135–146)

## 2015-06-16 NOTE — Progress Notes (Addendum)
HPI  The patient for followup of HeFPEF.well preserved ejection fraction and narrow complex tachycardia.    He was in the hospital recently with SVT that would not break with his usual maneuvers. He did have an elevated troponin. Of note echocardiogram during that hospitalization demonstrated severe mitral regurgitation with prolapse questionable valve mass. He did have a follow-up stress test demonstrating an EF of we 53%. There is a median defect in the basal inferior and mid inferolateral location with a small area of ischemia. This was predominantly fixed.   Of note his ejection fraction on echo was actually normal.  The patient denies any new cardiovascular symptoms. He's had no further sustained tachycardia arrhythmias. He's not had any chest pressure, neck or arm discomfort. He's not any palpitations, presyncope or syncope. He's had no PND or orthopnea. He's complaining of neck pain.  No Known Allergies  Current Outpatient Prescriptions  Medication Sig Dispense Refill  . aspirin EC 81 MG tablet Take 81 mg by mouth daily.    . diclofenac sodium (VOLTAREN) 1 % GEL Apply 1 application topically 3 (three) times daily as needed (pain).     Marland Kitchen diltiazem (CARDIZEM CD) 120 MG 24 hr capsule Take 1 capsule (120 mg total) by mouth daily. 30 capsule 6  . finasteride (PROSCAR) 5 MG tablet Take 5 mg by mouth daily.    . fluticasone (FLONASE) 50 MCG/ACT nasal spray Place 1 spray into both nostrils daily as needed for allergies or rhinitis.    . furosemide (LASIX) 40 MG tablet Take 1.5 tablets (60 mg total) by mouth 2 (two) times daily. 120 tablet 5  . Menthol-Methyl Salicylate (MUSCLE RUB) 10-15 % CREA Apply 1 application topically 2 (two) times daily.     . potassium chloride SA (K-DUR,KLOR-CON) 20 MEQ tablet Take 1 tablet (20 mEq total) by mouth daily. 30 tablet 5  . pravastatin (PRAVACHOL) 40 MG tablet Take 1 tablet (40 mg total) by mouth daily at 6 PM. 30 tablet 11  . tamsulosin (FLOMAX) 0.4 MG  CAPS capsule Take 0.4 mg by mouth daily.     . ticagrelor (BRILINTA) 90 MG TABS tablet Take 1 tablet (90 mg total) by mouth 2 (two) times daily. 180 tablet 3  . vitamin B-12 (CYANOCOBALAMIN) 1000 MCG tablet Take 1,000 mcg by mouth daily.    Marland Kitchen amLODipine (NORVASC) 5 MG tablet Take 1 tablet (5 mg total) by mouth daily. 30 tablet 11  . metoprolol (LOPRESSOR) 50 MG tablet Take 50 mg by mouth 2 (two) times daily.    . traMADol (ULTRAM) 50 MG tablet Take 50 mg by mouth every 6 (six) hours as needed for moderate pain. Take with food     No current facility-administered medications for this visit.    Past Medical History  Diagnosis Date  . Coronary artery disease     a. 11/2014 NSTEMI: DESx 2 placed to LAD and RCA   . Mitral valve prolapse   . Diabetes mellitus, type II ([place])     a. HgA1c 6.8 in 03/2015  . Mitral regurgitation     a. severe wtih flail leaflet by 2D ECHO (06/05/15)  . SVT (supraventricular tachycardia) (HCC)     a. recurrent, usually responsive to vagal manuevers  . HTN (hypertension)   . Pulmonary HTN ([place])   . Lower extremity edema     a. chronic  . HLD (hyperlipidemia)   . Hypertension   . OA (osteoarthritis)   . ED (erectile dysfunction)   .  Chronic fatigue   . Gout   . Spinal stenosis   . Prostate cancer ([place])   . CAD (coronary artery disease)     Past Surgical History  Procedure Laterality Date  . Cardiac surgery      2 cardiac stents  . Joint replacement      shoulder  . Back surgery x 2    . Rt rotator cuff repair    . Cervical spine surgery    . Back surgery    . Left heart catheterization with coronary angiogram N/A 11/16/2014    Procedure: LEFT HEART CATHETERIZATION WITH CORONARY ANGIOGRAM;  Surgeon: Troy Sine, MD;  Location: [facility] CATH LAB;  Service: Cardiovascular;  Laterality: N/A;     ROS: some difficulty swallowing, urinary frequency, leg swelling. Otherwise as stated in the HPI and negative for all other systems.  PHYSICAL EXAM BP 130/70  mmHg  Pulse 73  Ht 5\' 9"  (1.753 m)  Wt 235 lb (106.595 kg)  BMI 34.69 kg/m2 GENERAL:  Well appearing HEENT:  Pupils equal round and reactive, fundi not visualized, oral mucosa unremarkable, dentures NECK:  Positive jugular venous distention, waveform within normal limits, carotid upstroke brisk and symmetric, no bruits, no thyromegaly LYMPHATICS:  No cervical, inguinal adenopathy LUNGS:  Clear to auscultation bilaterally BACK:  No CVA tenderness CHEST:  Unremarkable HEART:  PMI not displaced or sustained,S1 and S2 within normal limits, no S3, no S4, no clicks, no rubs, 3/6 apical systolic murmur radiating to the axilla, and no diastolic murmurs ABD:  Flat, positive bowel sounds normal in frequency in pitch, no bruits, no rebound, no guarding, no midline pulsatile mass, no hepatomegaly, no splenomegaly EXT:  2 plus pulses upper but decreased dorsalis pedis and posterior tibialis bilateral, moderate bilateral lower extremity edema, no cyanosis no clubbing NEURO:  Nonfocal   ASSESSMENT AND PLAN  SVT-  He will continue with conservative therapy unless he has significant recurrence on responsive to vagal maneuvers and beta blockers.  HFpEF/severe LVH/mod MR/pHTN (RVSP 24mmHg) -  He seems to be euvolemic. He will continue the meds as listed.  Severe MR with possible mass-    This was severe on the last transthoracic echo. I will plan a TEE.   He would be a somewhat high risk candidate he would consider repair or replacement if we thought this was necessary and possible.  Elevated troponin/CAD-   He had predominantly fixed inferior defect. Is not having any symptoms. No change in therapy is indicated.  DM- HgA1 was 6.8.  I will defer to [place], MD  Possibly hypothyroidism-   He has had a mildly depressed T3 and normal T4.  Last TSH was normal in 11/2014.  I will defer to [place], MD  HLD- LDL 80. Goal <70 with CAD. We placed him on statin.  I will follow a lipid profile  in the future.

## 2015-06-16 NOTE — Patient Instructions (Signed)
Your physician recommends that you schedule a follow-up appointment in [place].  [place] has requested that you have an  TRANSESOPHAGEAL echocardiogram at [facility]. Echocardiography is a painless test that uses sound waves to create images of your heart. It provides your doctor with information about the size and shape of your heart and how well your heart's chambers and valves are working. This procedure takes approximately one hour. There are no restrictions for this procedure.   Labs are need for procedure- CBC,BMP,PT,PTT  Transesophageal Echocardiogram Transesophageal echocardiography (TEE) is a special type of test that produces images of the heart by using sound waves (echocardiogram). This type of echocardiography can obtain better images of the heart than standard echocardiography. TEE is done by passing a flexible tube down the esophagus. The heart is located in front of the esophagus. Because the heart and esophagus are close to one another, your health care provider can take very clear, detailed pictures of the heart via ultrasound waves. TEE may be done:  If your health care provider needs more information based on standard echocardiography findings.  If you had a stroke. This might have happened because a clot formed in your heart. TEE can visualize different areas of the heart and check for clots.  To check valve anatomy and function.  To check for infection on the inside of your heart (endocarditis).  To evaluate the dividing wall (septum) of the heart and presence of a hole that did not close after birth (patent foramen ovale or atrial septal defect).  To help diagnose a tear in the wall of the aorta (aortic dissection).  During cardiac valve surgery. This allows the surgeon to assess the valve repair before closing the chest.  During a variety of other cardiac procedures to guide positioning of catheters.  Sometimes before a  cardioversion, which is a shock to convert heart rhythm back to normal. LET [facility] CARE PROVIDER KNOW ABOUT:   Any allergies you have.  All medicines you are taking, including vitamins, herbs, eye drops, creams, and over-the-counter medicines.  Previous problems you or members of your family have had with the use of anesthetics.  Any blood disorders you have.  Previous surgeries you have had.  Medical conditions you have.  Swallowing difficulties.  An esophageal obstruction. RISKS AND COMPLICATIONS  Generally, TEE is a safe procedure. However, as with any procedure, complications can occur. Possible complications include an esophageal tear (rupture). BEFORE THE PROCEDURE   Do not eat or drink for 6 hours before the procedure or as directed by your health care provider.  Arrange for someone to drive you home after the procedure. Do not drive yourself home. During the procedure, you will be given medicines that can continue to make you feel drowsy and can impair your reflexes.  An IV access tube will be started in the arm. PROCEDURE   A medicine to help you relax (sedative) will be given through the IV access tube.  A medicine may be sprayed or gargled to numb the back of the throat.  Your blood pressure, heart rate, and breathing (vital signs) will be monitored during the procedure.  The TEE probe is a long, flexible tube. The tip of the probe is placed into the back of the mouth, and you will be asked to swallow. This helps to pass the tip of the probe into the esophagus. Once the tip of the probe is in the correct area, your health care provider can  take pictures of the heart.  TEE is usually not a painful procedure. You may feel the probe press against the back of the throat. The probe does not enter the trachea and does not affect your breathing. AFTER THE PROCEDURE   You will be in bed, resting, until you have fully returned to consciousness.  When you first awaken,  your throat may feel slightly sore and will probably still feel numb. This will improve slowly over time.  You will not be allowed to eat or drink until it is clear that the numbness has improved.  Once you have been able to drink, urinate, and sit on the edge of the bed without feeling sick to your stomach (nausea) or dizzy, you may be cleared to go home.  You should have a friend or family member with you for the next 24 hours after your procedure.   This information is not intended to replace advice given to you by your health care provider. Make sure you discuss any questions you have with your health care provider.   Document Released: 10/12/2002 Document Revised: 07/27/2013 Document Reviewed: 01/21/2013 Elsevier Interactive Patient Education [employer].

## 2015-06-17 LAB — PROTIME-INR
INR: 1.15 (ref <1.50)
PROTHROMBIN TIME: 14.8 s (ref 11.6–15.2)

## 2015-06-17 LAB — APTT: APTT: 32 s (ref 24–37)

## 2015-06-19 ENCOUNTER — Other Ambulatory Visit: Payer: Self-pay | Admitting: *Deleted

## 2015-06-19 ENCOUNTER — Other Ambulatory Visit: Payer: Self-pay | Admitting: Family Medicine

## 2015-06-19 DIAGNOSIS — Z01818 Encounter for other preprocedural examination: Secondary | ICD-10-CM

## 2015-06-22 ENCOUNTER — Ambulatory Visit (HOSPITAL_BASED_OUTPATIENT_CLINIC_OR_DEPARTMENT_OTHER)
Admission: RE | Admit: 2015-06-22 | Discharge: 2015-06-22 | Disposition: A | Discharge status: Home / Self Care | Payer: Medicare Other | Source: Ambulatory Visit | Attending: Cardiovascular Disease | Admitting: Cardiovascular Disease

## 2015-06-22 ENCOUNTER — Encounter (HOSPITAL_COMMUNITY)
Admission: RE | Disposition: A | Discharge status: Home / Self Care | Payer: Self-pay | Source: Ambulatory Visit | Attending: Cardiovascular Disease

## 2015-06-22 ENCOUNTER — Encounter (HOSPITAL_COMMUNITY): Payer: Self-pay | Admitting: *Deleted

## 2015-06-22 ENCOUNTER — Ambulatory Visit (HOSPITAL_COMMUNITY)
Admission: RE | Admit: 2015-06-22 | Discharge: 2015-06-22 | Disposition: A | Discharge status: Home / Self Care | Payer: Medicare Other | Source: Ambulatory Visit | Attending: Cardiovascular Disease | Admitting: Cardiovascular Disease

## 2015-06-22 DIAGNOSIS — I471 Supraventricular tachycardia: Secondary | ICD-10-CM | POA: Insufficient documentation

## 2015-06-22 DIAGNOSIS — I340 Nonrheumatic mitral (valve) insufficiency: Secondary | ICD-10-CM | POA: Insufficient documentation

## 2015-06-22 DIAGNOSIS — Z01818 Encounter for other preprocedural examination: Secondary | ICD-10-CM

## 2015-06-22 DIAGNOSIS — Z7982 Long term (current) use of aspirin: Secondary | ICD-10-CM | POA: Diagnosis not present

## 2015-06-22 DIAGNOSIS — I2510 Atherosclerotic heart disease of native coronary artery without angina pectoris: Secondary | ICD-10-CM | POA: Insufficient documentation

## 2015-06-22 DIAGNOSIS — I252 Old myocardial infarction: Secondary | ICD-10-CM | POA: Diagnosis not present

## 2015-06-22 DIAGNOSIS — E785 Hyperlipidemia, unspecified: Secondary | ICD-10-CM | POA: Insufficient documentation

## 2015-06-22 DIAGNOSIS — Z8546 Personal history of malignant neoplasm of prostate: Secondary | ICD-10-CM | POA: Insufficient documentation

## 2015-06-22 DIAGNOSIS — I10 Essential (primary) hypertension: Secondary | ICD-10-CM | POA: Diagnosis not present

## 2015-06-22 DIAGNOSIS — E119 Type 2 diabetes mellitus without complications: Secondary | ICD-10-CM | POA: Diagnosis not present

## 2015-06-22 DIAGNOSIS — Z955 Presence of coronary angioplasty implant and graft: Secondary | ICD-10-CM | POA: Diagnosis not present

## 2015-06-22 HISTORY — PX: TEE WITHOUT CARDIOVERSION: SHX5443

## 2015-06-22 SURGERY — ECHOCARDIOGRAM, TRANSESOPHAGEAL
Anesthesia: Moderate Sedation

## 2015-06-22 MED ORDER — MIDAZOLAM HCL 10 MG/2ML IJ SOLN
INTRAMUSCULAR | Status: DC | PRN
  Administered 2015-06-22 (×3): 1 mg via INTRAVENOUS

## 2015-06-22 MED ORDER — MIDAZOLAM HCL 5 MG/ML IJ SOLN
INTRAMUSCULAR | Status: AC
  Filled 2015-06-22: qty 1

## 2015-06-22 MED ORDER — SODIUM CHLORIDE 0.9 % IV SOLN: INTRAVENOUS | Status: DC

## 2015-06-22 MED ORDER — FENTANYL CITRATE (PF) 100 MCG/2ML IJ SOLN
INTRAMUSCULAR | Status: AC
  Filled 2015-06-22: qty 2

## 2015-06-22 MED ORDER — FENTANYL CITRATE (PF) 100 MCG/2ML IJ SOLN
INTRAMUSCULAR | Status: DC | PRN
  Administered 2015-06-22: 25 ug via INTRAVENOUS
  Administered 2015-06-22 (×2): 12.5 ug via INTRAVENOUS

## 2015-06-22 MED ORDER — BUTAMBEN-TETRACAINE-BENZOCAINE 2-2-14 % EX AERO
INHALATION_SPRAY | CUTANEOUS | Status: DC | PRN
  Administered 2015-06-22: 2 via TOPICAL

## 2015-06-22 NOTE — CV Procedure (Signed)
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  Flail A3 segment.  For further details see full report.  Ramsey Midgett C. Oval Linsey, MD 06/22/2015 12:40 PM

## 2015-06-22 NOTE — Discharge Instructions (Signed)

## 2015-06-22 NOTE — Interval H&P Note (Signed)
History and Physical Interval Note:  06/22/2015 7:55 AM  [name]  has presented today for surgery, with the diagnosis of ABNORMAL ECHO  The various methods of treatment have been discussed with the patient and family. After consideration of risks, benefits and other options for treatment, the patient has consented to  Procedure(s): TRANSESOPHAGEAL ECHOCARDIOGRAM (TEE) (N/A) as a surgical intervention .  The patient's history has been reviewed, patient examined, no change in status, stable for surgery.  I have reviewed the patient's chart and labs.  Questions were answered to the patient's satisfaction.     Sharol Harness, MD 06/22/2015  7:56 AM

## 2015-06-22 NOTE — H&P (View-Only) (Signed)
HPI  The patient for followup of HeFPEF.well preserved ejection fraction and narrow complex tachycardia.    He was in the hospital recently with SVT that would not break with his usual maneuvers. He did have an elevated troponin. Of note echocardiogram during that hospitalization demonstrated severe mitral regurgitation with prolapse questionable valve mass. He did have a follow-up stress test demonstrating an EF of we 9%. There is a median defect in the basal inferior and mid inferolateral location with a small area of ischemia. This was predominantly fixed.   Of note his ejection fraction on echo was actually normal.  The patient denies any new cardiovascular symptoms. He's had no further sustained tachycardia arrhythmias. He's not had any chest pressure, neck or arm discomfort. He's not any palpitations, presyncope or syncope. He's had no PND or orthopnea. He's complaining of neck pain.  No Known Allergies  Current Outpatient Prescriptions  Medication Sig Dispense Refill  . aspirin EC 81 MG tablet Take 81 mg by mouth daily.    . diclofenac sodium (VOLTAREN) 1 % GEL Apply 1 application topically 3 (three) times daily as needed (pain).     Marland Kitchen diltiazem (CARDIZEM CD) 120 MG 24 hr capsule Take 1 capsule (120 mg total) by mouth daily. 30 capsule 6  . finasteride (PROSCAR) 5 MG tablet Take 5 mg by mouth daily.    . fluticasone (FLONASE) 50 MCG/ACT nasal spray Place 1 spray into both nostrils daily as needed for allergies or rhinitis.    . furosemide (LASIX) 40 MG tablet Take 1.5 tablets (60 mg total) by mouth 2 (two) times daily. 120 tablet 5  . Menthol-Methyl Salicylate (MUSCLE RUB) 10-15 % CREA Apply 1 application topically 2 (two) times daily.     . potassium chloride SA (K-DUR,KLOR-CON) 20 MEQ tablet Take 1 tablet (20 mEq total) by mouth daily. 30 tablet 5  . pravastatin (PRAVACHOL) 40 MG tablet Take 1 tablet (40 mg total) by mouth daily at 6 PM. 30 tablet 11  . tamsulosin (FLOMAX) 0.4 MG CAPS  capsule Take 0.4 mg by mouth daily.     . ticagrelor (BRILINTA) 90 MG TABS tablet Take 1 tablet (90 mg total) by mouth 2 (two) times daily. 180 tablet 3  . vitamin B-12 (CYANOCOBALAMIN) 1000 MCG tablet Take 1,000 mcg by mouth daily.    Marland Kitchen amLODipine (NORVASC) 5 MG tablet Take 1 tablet (5 mg total) by mouth daily. 30 tablet 11  . metoprolol (LOPRESSOR) 50 MG tablet Take 50 mg by mouth 2 (two) times daily.    . traMADol (ULTRAM) 50 MG tablet Take 50 mg by mouth every 6 (six) hours as needed for moderate pain. Take with food     No current facility-administered medications for this visit.    Past Medical History  Diagnosis Date  . Coronary artery disease     a. 11/2014 NSTEMI: DESx 2 placed to LAD and RCA   . Mitral valve prolapse   . Diabetes mellitus, type II ([place])     a. HgA1c 6.8 in 03/2015  . Mitral regurgitation     a. severe wtih flail leaflet by 2D ECHO (06/05/15)  . SVT (supraventricular tachycardia) (HCC)     a. recurrent, usually responsive to vagal manuevers  . HTN (hypertension)   . Pulmonary HTN ([place])   . Lower extremity edema     a. chronic  . HLD (hyperlipidemia)   . Hypertension   . OA (osteoarthritis)   . ED (erectile dysfunction)   .  Chronic fatigue   . Gout   . Spinal stenosis   . Prostate cancer ([place])   . CAD (coronary artery disease)     Past Surgical History  Procedure Laterality Date  . Cardiac surgery      2 cardiac stents  . Joint replacement      shoulder  . Back surgery x 2    . Rt rotator cuff repair    . Cervical spine surgery    . Back surgery    . Left heart catheterization with coronary angiogram N/A 11/16/2014    Procedure: LEFT HEART CATHETERIZATION WITH CORONARY ANGIOGRAM;  Surgeon: Troy Sine, MD;  Location: [facility] CATH LAB;  Service: Cardiovascular;  Laterality: N/A;     ROS: some difficulty swallowing, urinary frequency, leg swelling. Otherwise as stated in the HPI and negative for all other systems.  PHYSICAL EXAM BP 130/70 mmHg   Pulse 73  Ht 5\' 9"  (1.753 m)  Wt 235 lb (106.595 kg)  BMI 34.69 kg/m2 GENERAL:  Well appearing HEENT:  Pupils equal round and reactive, fundi not visualized, oral mucosa unremarkable, dentures NECK:  Positive jugular venous distention, waveform within normal limits, carotid upstroke brisk and symmetric, no bruits, no thyromegaly LYMPHATICS:  No cervical, inguinal adenopathy LUNGS:  Clear to auscultation bilaterally BACK:  No CVA tenderness CHEST:  Unremarkable HEART:  PMI not displaced or sustained,S1 and S2 within normal limits, no S3, no S4, no clicks, no rubs, 3/6 apical systolic murmur radiating to the axilla, and no diastolic murmurs ABD:  Flat, positive bowel sounds normal in frequency in pitch, no bruits, no rebound, no guarding, no midline pulsatile mass, no hepatomegaly, no splenomegaly EXT:  2 plus pulses upper but decreased dorsalis pedis and posterior tibialis bilateral, moderate bilateral lower extremity edema, no cyanosis no clubbing NEURO:  Nonfocal   ASSESSMENT AND PLAN  SVT-  He will continue with conservative therapy unless he has significant recurrence on responsive to vagal maneuvers and beta blockers.  HFpEF/severe LVH/mod MR/pHTN (RVSP 40mmHg) -  He seems to be euvolemic. He will continue the meds as listed.  Severe MR with possible mass-    This was severe on the last transthoracic echo. I will plan a TEE.   He would be a somewhat high risk candidate he would consider repair or replacement if we thought this was necessary and possible.  Elevated troponin/CAD-   He had predominantly fixed inferior defect. Is not having any symptoms. No change in therapy is indicated.  DM- HgA1 was 6.8.  I will defer to [place], MD  Possibly hypothyroidism-   He has had a mildly depressed T3 and normal T4.  Last TSH was normal in 11/2014.  I will defer to [place], MD  HLD- LDL 80. Goal <70 with CAD. We placed him on statin.  I will follow a lipid profile in the  future.

## 2015-06-22 NOTE — Progress Notes (Signed)
Echocardiogram Echocardiogram Transesophageal has been performed.  [name] 06/22/2015, 12:05 PM

## 2015-06-23 ENCOUNTER — Other Ambulatory Visit: Payer: Self-pay | Admitting: Family Medicine

## 2015-06-23 MED ORDER — FINASTERIDE 5 MG PO TABS: 5.0000 mg | ORAL_TABLET | Freq: Every day | ORAL | Status: DC

## 2015-06-23 NOTE — Telephone Encounter (Signed)
Medication refilled per protocol. 

## 2015-06-26 ENCOUNTER — Encounter (HOSPITAL_COMMUNITY): Payer: Self-pay | Admitting: Cardiovascular Disease

## 2015-07-05 ENCOUNTER — Emergency Department (HOSPITAL_COMMUNITY): Payer: Medicare Other

## 2015-07-05 ENCOUNTER — Emergency Department (HOSPITAL_COMMUNITY)
Admission: EM | Admit: 2015-07-05 | Discharge: 2015-07-05 | Disposition: A | Discharge status: Home / Self Care | Payer: Medicare Other | Attending: Emergency Medicine | Admitting: Emergency Medicine

## 2015-07-05 ENCOUNTER — Encounter (HOSPITAL_COMMUNITY): Payer: Self-pay | Admitting: Neurology

## 2015-07-05 DIAGNOSIS — R600 Localized edema: Secondary | ICD-10-CM | POA: Diagnosis not present

## 2015-07-05 DIAGNOSIS — R05 Cough: Secondary | ICD-10-CM | POA: Insufficient documentation

## 2015-07-05 DIAGNOSIS — I509 Heart failure, unspecified: Secondary | ICD-10-CM | POA: Diagnosis not present

## 2015-07-05 DIAGNOSIS — E119 Type 2 diabetes mellitus without complications: Secondary | ICD-10-CM | POA: Insufficient documentation

## 2015-07-05 DIAGNOSIS — Z7982 Long term (current) use of aspirin: Secondary | ICD-10-CM | POA: Insufficient documentation

## 2015-07-05 DIAGNOSIS — N529 Male erectile dysfunction, unspecified: Secondary | ICD-10-CM | POA: Insufficient documentation

## 2015-07-05 DIAGNOSIS — M1990 Unspecified osteoarthritis, unspecified site: Secondary | ICD-10-CM | POA: Diagnosis not present

## 2015-07-05 DIAGNOSIS — R142 Eructation: Secondary | ICD-10-CM | POA: Insufficient documentation

## 2015-07-05 DIAGNOSIS — Z79899 Other long term (current) drug therapy: Secondary | ICD-10-CM | POA: Diagnosis not present

## 2015-07-05 DIAGNOSIS — R0602 Shortness of breath: Secondary | ICD-10-CM | POA: Diagnosis not present

## 2015-07-05 DIAGNOSIS — Z9889 Other specified postprocedural states: Secondary | ICD-10-CM | POA: Insufficient documentation

## 2015-07-05 DIAGNOSIS — Z8546 Personal history of malignant neoplasm of prostate: Secondary | ICD-10-CM | POA: Diagnosis not present

## 2015-07-05 DIAGNOSIS — Z87891 Personal history of nicotine dependence: Secondary | ICD-10-CM | POA: Insufficient documentation

## 2015-07-05 DIAGNOSIS — E785 Hyperlipidemia, unspecified: Secondary | ICD-10-CM | POA: Insufficient documentation

## 2015-07-05 DIAGNOSIS — Z87820 Personal history of traumatic brain injury: Secondary | ICD-10-CM | POA: Diagnosis not present

## 2015-07-05 DIAGNOSIS — I2510 Atherosclerotic heart disease of native coronary artery without angina pectoris: Secondary | ICD-10-CM | POA: Insufficient documentation

## 2015-07-05 DIAGNOSIS — R2243 Localized swelling, mass and lump, lower limb, bilateral: Secondary | ICD-10-CM | POA: Insufficient documentation

## 2015-07-05 DIAGNOSIS — I10 Essential (primary) hypertension: Secondary | ICD-10-CM | POA: Diagnosis not present

## 2015-07-05 LAB — CBC
HCT: 44.7 % (ref 39.0–52.0)
HEMOGLOBIN: 14.8 g/dL (ref 13.0–17.0)
MCH: 31.4 pg (ref 26.0–34.0)
MCHC: 33.1 g/dL (ref 30.0–36.0)
MCV: 94.7 fL (ref 78.0–100.0)
Platelets: 150 10*3/uL (ref 150–400)
RBC: 4.72 MIL/uL (ref 4.22–5.81)
RDW: 13.7 % (ref 11.5–15.5)
WBC: 8.8 10*3/uL (ref 4.0–10.5)

## 2015-07-05 LAB — BASIC METABOLIC PANEL
ANION GAP: 10 (ref 5–15)
BUN: 12 mg/dL (ref 6–20)
CALCIUM: 9.3 mg/dL (ref 8.9–10.3)
CO2: 29 mmol/L (ref 22–32)
Chloride: 101 mmol/L (ref 101–111)
Creatinine, Ser: 1.2 mg/dL (ref 0.61–1.24)
GFR, EST NON AFRICAN AMERICAN: 54 mL/min — AB (ref >60)
Glucose, Bld: 136 mg/dL — ABNORMAL HIGH (ref 65–99)
Potassium: 3.2 mmol/L — ABNORMAL LOW (ref 3.5–5.1)
Sodium: 140 mmol/L (ref 135–145)

## 2015-07-05 LAB — I-STAT TROPONIN, ED
TROPONIN I, POC: 0.23 ng/mL — AB (ref 0.00–0.08)
Troponin i, poc: 0.23 ng/mL (ref 0.00–0.08)

## 2015-07-05 MED ORDER — POTASSIUM CHLORIDE CRYS ER 20 MEQ PO TBCR
40.0000 meq | EXTENDED_RELEASE_TABLET | Freq: Once | ORAL | Status: AC
  Administered 2015-07-05: 40 meq via ORAL
  Filled 2015-07-05: qty 2

## 2015-07-05 NOTE — ED Notes (Signed)
Patient brought back to room via wheelchair with family in tow; myself and Lovena Le, NT undressed patient, placed in a gown, on monitor, continuous pulse oximetry and blood pressure cuff; visitor at bedside; [place], RN present in room,

## 2015-07-05 NOTE — ED Notes (Signed)
Pt reports sob since 0330 this morning. Denies cp, since this morning he isn't vomiting but he says everything comes back up. Pt is alert x 4.

## 2015-07-05 NOTE — Discharge Instructions (Signed)
Discuss details with your cardiologist and primary doctor at your appointments in the next 2 days. If you were given medicines take as directed.  If you are on coumadin or contraceptives realize their levels and effectiveness is altered by many different medicines.  If you have any reaction (rash, tongues swelling, other) to the medicines stop taking and see a physician.    If your blood pressure was elevated in the ER make sure you follow up for management with a primary doctor or return for chest pain, shortness of breath or stroke symptoms.  Please follow up as directed and return to the ER or see a physician for new or worsening symptoms.  Thank you. Filed Vitals:   07/05/15 1302 07/05/15 1348 07/05/15 1430 07/05/15 1445  BP: 147/79 152/70 139/77 139/76  Pulse: 74 70 67 67  Temp: 97.7 F (36.5 C)     TempSrc: Oral     Resp: 18 21 18 18   SpO2: 94% 97% 98% 95%

## 2015-07-05 NOTE — ED Provider Notes (Addendum)
CSN: [id number]     Arrival date & time 07/05/15  1257 History   First MD Initiated Contact with Patient 07/05/15 1345     Chief Complaint  Patient presents with  . Shortness of Breath     (Consider location/radiation/quality/duration/timing/severity/associated sxs/prior Treatment) HPI Comments: 81 year old male with history of high blood pressure, traumatic brain injury, pulmonary hypertension, heart failure, elevated troponin, coronary artery disease stents presents with belching sensation since this morning. Patient initially told nursing shortness of breath however I clarified with him and he denies shortness of breath more than his usual and said he belched/spit up a couple times no blood. Minimal cough. The wife confirms. Currently no chest pain no shortness of breath. Patient has a follow-up appointment the specialist tomorrow. Patient wishing to go home. Discussed getting screening cardiac and chest x-ray.  Patient is a 81 y.o. male presenting with shortness of breath. The history is provided by the patient and the spouse.  Shortness of Breath Associated symptoms: no abdominal pain, no chest pain, no fever, no headaches, no neck pain, no rash and no vomiting     Past Medical History  Diagnosis Date  . Coronary artery disease     a. 11/2014 NSTEMI: DESx 2 placed to LAD and RCA   . Mitral valve prolapse   . Diabetes mellitus, type II ([place])     a. HgA1c 6.8 in 03/2015  . Mitral regurgitation     a. severe wtih flail leaflet by 2D ECHO (06/05/15)  . SVT (supraventricular tachycardia) (HCC)     a. recurrent, usually responsive to vagal manuevers  . HTN (hypertension)   . Pulmonary HTN ([place])   . Lower extremity edema     a. chronic  . HLD (hyperlipidemia)   . Hypertension   . OA (osteoarthritis)   . ED (erectile dysfunction)   . Chronic fatigue   . Gout   . Spinal stenosis   . Prostate cancer [facility])    Past Surgical History  Procedure Laterality Date  . Cardiac surgery      2 cardiac stents  . Joint replacement      shoulder  . Back surgery x 2    . Rt rotator cuff repair    . Cervical spine surgery    . Back surgery    . Left heart catheterization with coronary angiogram N/A 11/16/2014    Procedure: LEFT HEART CATHETERIZATION WITH CORONARY ANGIOGRAM;  Surgeon: Troy Sine, MD;  Location: [facility] CATH LAB;  Service: Cardiovascular;  Laterality: N/A;  . Tee without cardioversion N/A 06/22/2015    Procedure: TRANSESOPHAGEAL ECHOCARDIOGRAM (TEE);  Surgeon: Skeet Latch, MD;  Location: [facility] ENDOSCOPY;  Service: Cardiovascular;  Laterality: N/A;   Family History  Problem Relation Age of Onset  . Heart failure Mother 90  . Heart attack Mother   . Heart attack Brother    Social History  Substance Use Topics  . Smoking status: Former Research scientist (life sciences)  . Smokeless tobacco: Never Used     Comment: QUIT SMOKING  MANY YEARS AGO"  . Alcohol Use: No    Review of Systems  Constitutional: Negative for fever and chills.  HENT: Negative for congestion.   Eyes: Negative for visual disturbance.  Respiratory: Positive for shortness of breath.   Cardiovascular: Positive for leg swelling (chronic). Negative for chest pain.  Gastrointestinal: Negative for vomiting and abdominal pain.  Genitourinary: Negative for dysuria and flank pain.  Musculoskeletal: Negative for back pain, neck pain and neck stiffness.  Skin: Negative  for rash.  Neurological: Negative for light-headedness and headaches.      Allergies  Review of patient's allergies indicates no known allergies.  Home Medications   Prior to Admission medications   Medication Sig Start Date End Date Taking? Authorizing Provider  amLODipine (NORVASC) 5 MG tablet Take 1 tablet (5 mg total) by mouth daily. 11/17/14  Yes Brett Canales, PA-C  aspirin EC 81 MG tablet Take 81 mg by mouth daily.   Yes Historical Provider, MD  diclofenac sodium (VOLTAREN) 1 % GEL Apply 1 application topically 3 (three) times daily as needed  (pain).    Yes Historical Provider, MD  diltiazem (CARDIZEM CD) 120 MG 24 hr capsule Take 1 capsule (120 mg total) by mouth daily. 06/05/15  Yes Eileen Stanford, PA-C  finasteride (PROSCAR) 5 MG tablet Take 1 tablet (5 mg total) by mouth daily. 06/23/15  Yes [name], MD  fluticasone (FLONASE) 50 MCG/ACT nasal spray Place 1 spray into both nostrils daily as needed for allergies or rhinitis.   Yes Historical Provider, MD  furosemide (LASIX) 40 MG tablet Take 1.5 tablets (60 mg total) by mouth 2 (two) times daily. 05/08/15  Yes [name], MD  Menthol-Methyl Salicylate (MUSCLE RUB) 10-15 % CREA Apply 1 application topically 2 (two) times daily as needed for muscle pain.    Yes Historical Provider, MD  metoprolol (LOPRESSOR) 50 MG tablet Take 50 mg by mouth 2 (two) times daily. 04/12/15  Yes Historical Provider, MD  potassium chloride SA (K-DUR,KLOR-CON) 20 MEQ tablet Take 1 tablet (20 mEq total) by mouth daily. 11/17/14  Yes Brett Canales, PA-C  pravastatin (PRAVACHOL) 40 MG tablet Take 1 tablet (40 mg total) by mouth daily at 6 PM. 06/05/15  Yes Eileen Stanford, PA-C  tamsulosin (FLOMAX) 0.4 MG CAPS capsule Take 0.4 mg by mouth daily.  06/20/14  Yes Historical Provider, MD  ticagrelor (BRILINTA) 90 MG TABS tablet Take 1 tablet (90 mg total) by mouth 2 (two) times daily. 11/17/14  Yes Brett Canales, PA-C  traMADol (ULTRAM) 50 MG tablet Take 50 mg by mouth every 6 (six) hours as needed (for pain).   Yes Historical Provider, MD  vitamin B-12 (CYANOCOBALAMIN) 1000 MCG tablet Take 1,000 mcg by mouth daily.   Yes Historical Provider, MD  traMADol (ULTRAM) 50 MG tablet TAKE ONE (1) TABLET EVERY 6 HOURS 06/20/15   [name], MD   BP 148/70 mmHg  Pulse 69  Temp(Src) 97.7 F (36.5 C) (Oral)  Resp 19  SpO2 95% Physical Exam  Constitutional: He is oriented to person, place, and time. He appears well-developed and well-nourished.  HENT:  Head: Normocephalic and atraumatic.  Eyes:  Conjunctivae are normal. Right eye exhibits no discharge. Left eye exhibits no discharge.  Neck: Normal range of motion. Neck supple. No tracheal deviation present.  Cardiovascular: Normal rate and regular rhythm.   Pulmonary/Chest: Effort normal and breath sounds normal.  Abdominal: Soft. He exhibits no distension. There is no tenderness. There is no guarding.  Musculoskeletal: He exhibits edema (bilateral nontender lower extremities).  Neurological: He is alert and oriented to person, place, and time.  Skin: Skin is warm. No rash noted.  Psychiatric: He has a normal mood and affect.  Nursing note and vitals reviewed.   ED Course  Procedures (including critical care time) Labs Review Labs Reviewed  BASIC METABOLIC PANEL - Abnormal; Notable for the following:    Potassium 3.2 (*)    Glucose, Bld 136 (*)  GFR calc non Af Amer 54 (*)    All other components within normal limits  I-STAT TROPOININ, ED - Abnormal; Notable for the following:    Troponin i, poc 0.23 (*)    All other components within normal limits  I-STAT TROPOININ, ED - Abnormal; Notable for the following:    Troponin i, poc 0.23 (*)    All other components within normal limits  CBC    Imaging Review Dg Chest 2 View  07/05/2015  CLINICAL DATA:  Shortness of breath. EXAM: CHEST  2 VIEW COMPARISON:  June 04, 2015. FINDINGS: Stable cardiomediastinal silhouette. No pneumothorax or pleural effusion is noted. Both lungs are clear. The visualized skeletal structures are unremarkable. IMPRESSION: No active cardiopulmonary disease. Electronically Signed   By: Marijo Conception, M.D.   On: 07/05/2015 13:30   I have personally reviewed and evaluated these images and lab results as part of my medical decision-making.   EKG Interpretation   Date/Time:  Wednesday July 05 2015 13:03:50 EST Ventricular Rate:  74 PR Interval:  176 QRS Duration: 104 QT Interval:  444 QTC Calculation: 492 R Axis:   -49 Text  Interpretation:  Normal sinus rhythm Possible Left atrial enlargement  Left anterior fascicular block Possible Anteroseptal infarct , age  undetermined Abnormal ECG Confirmed by Malesha Suliman  MD, Alix Stowers ([id number]) on  07/05/2015 1:45:01 PM      MDM   Final diagnoses:  Belching symptom  Bilateral edema of lower extremity   Patient presents after brief episode of shortness of breath and belching. With cardiac history cardiac screen was ordered in triage, troponin elevated however similar to previous. Repeat troponin similar/lower than previous. Patient has no new symptoms at this time and wishes to follow-up outpatient with his specialist and primary doctor. Chest x-ray reviewed no acute findings. Results and differential diagnosis were discussed with the patient/parent/guardian. Xrays were independently reviewed by myself.  Close follow up outpatient was discussed, comfortable with the plan.   Medications  potassium chloride SA (K-DUR,KLOR-CON) CR tablet 40 mEq (40 mEq Oral Given 07/05/15 1504)    Filed Vitals:   07/05/15 1348 07/05/15 1430 07/05/15 1445 07/05/15 1530  BP: 152/70 139/77 139/76 148/70  Pulse: 70 67 67 69  Temp:      TempSrc:      Resp: 21 18 18 19   SpO2: 97% 98% 95% 95%    Final diagnoses:  Belching symptom  Bilateral edema of lower extremity          Elnora Morrison, MD 07/05/15 1537  Elnora Morrison, MD 07/05/15 1606

## 2015-07-06 ENCOUNTER — Ambulatory Visit (INDEPENDENT_AMBULATORY_CARE_PROVIDER_SITE_OTHER): Payer: Medicare Other | Admitting: Cardiology

## 2015-07-06 ENCOUNTER — Encounter: Payer: Self-pay | Admitting: Cardiology

## 2015-07-06 VITALS — BP 138/72 | HR 98 | Ht 70.0 in | Wt 231.1 lb

## 2015-07-06 DIAGNOSIS — Z79899 Other long term (current) drug therapy: Secondary | ICD-10-CM

## 2015-07-06 DIAGNOSIS — I10 Essential (primary) hypertension: Secondary | ICD-10-CM

## 2015-07-06 DIAGNOSIS — I359 Nonrheumatic aortic valve disorder, unspecified: Secondary | ICD-10-CM | POA: Diagnosis not present

## 2015-07-06 DIAGNOSIS — I471 Supraventricular tachycardia: Secondary | ICD-10-CM

## 2015-07-06 DIAGNOSIS — I340 Nonrheumatic mitral (valve) insufficiency: Secondary | ICD-10-CM

## 2015-07-06 DIAGNOSIS — I330 Acute and subacute infective endocarditis: Secondary | ICD-10-CM

## 2015-07-06 MED ORDER — POTASSIUM CHLORIDE CRYS ER 20 MEQ PO TBCR: 40.0000 meq | EXTENDED_RELEASE_TABLET | Freq: Every day | ORAL | Status: DC

## 2015-07-06 NOTE — Patient Instructions (Addendum)
Your physician recommends that you schedule a follow-up appointment in: After Seeing Dr Roxy Manns  Your physician recommends that you return for lab work in: [place] has recommended you make the following change in your medication: Increase Potassium 40 mg daily  You have been referred to Dr Darylene Price  If you need a refill on your cardiac medications before your next appointment, please call your pharmacy.  Merry Christmas and Happy New Year!!

## 2015-07-06 NOTE — Progress Notes (Addendum)
HPI  The patient for followup of HeFPEF.well preserved ejection fraction, narrow complex tachycardia and MR.  After the last visit I sent him for a transesophageal ultrasound which demonstrated posteriorly directed severe mitral regurgitation with flail A3 segment of mitral valve with calcification. I have reviewed the images myself today.  Of note the patient actually was in the emergency room yesterday. He had an episode of belching and coughing up some yellow to clear sputum suddenly. I have reviewed these records. There was no evidence of heart failure on chest x-ray. He did have a mildly elevated on that this was lower than previous and he was not having any symptoms or change in his EKG.  Etiology of this was not clear. He said this sensation of belching for slight strangulation did not occur with eating food or swallowing his pills.   However, he thinks he's had more difficulty swallowing his pills since his transesophageal ultrasound. He's not had any pain however. He's been able to eat otherwise well. He's not had any new palpitations, presyncope or syncope. He's had no PND or orthopnea.   He was in the hospital recently with SVT that would not break with his usual maneuvers. He did have an elevated troponin. Of note echocardiogram during that hospitalization demonstrated severe mitral regurgitation with prolapse questionable valve mass. He did have a follow-up stress test demonstrating an EF of we 53%. There is a median defect in the basal inferior and mid inferolateral location with a small area of ischemia. This was predominantly fixed.   Of note his ejection fraction on echo was actually normal.  The patient denies any new cardiovascular symptoms. He's had no further sustained tachycardia arrhythmias. He's not had any chest pressure, neck or arm discomfort. He's not any palpitations, presyncope or syncope. He's had no PND or orthopnea. He's complaining of neck pain.There was mild  regurgitation. 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 There was severe regurgitation directed posteriorly.  No Known Allergies  Current Outpatient Prescriptions  Medication Sig Dispense Refill  . amLODipine (NORVASC) 5 MG tablet Take 1 tablet (5 mg total) by mouth daily. 30 tablet 11  . aspirin EC 81 MG tablet Take 81 mg by mouth daily.    . diclofenac sodium (VOLTAREN) 1 % GEL Apply 1 application topically 3 (three) times daily as needed (pain).     Marland Kitchen diltiazem (CARDIZEM CD) 120 MG 24 hr capsule Take 1 capsule (120 mg total) by mouth daily. 30 capsule 6  . finasteride (PROSCAR) 5 MG tablet Take 1 tablet (5 mg total) by mouth daily. 30 tablet 2  . fluticasone (FLONASE) 50 MCG/ACT nasal spray Place 1 spray into both nostrils daily as needed for allergies or rhinitis.    . furosemide (LASIX) 40 MG tablet Take 1.5 tablets (60 mg total) by mouth 2 (two) times daily. 120 tablet 5  . Menthol-Methyl Salicylate (MUSCLE RUB) 10-15 % CREA Apply 1 application topically 2 (two) times daily as needed for muscle pain.     . metoprolol (LOPRESSOR) 50 MG tablet Take 50 mg by mouth 2 (two) times daily.    . potassium chloride SA (K-DUR,KLOR-CON) 20 MEQ tablet Take 1 tablet (20 mEq total) by mouth daily. 30 tablet 5  . pravastatin (PRAVACHOL) 40 MG tablet Take 1 tablet (40 mg total) by mouth daily at 6 PM. 30 tablet 11  . tamsulosin (FLOMAX) 0.4 MG CAPS capsule Take 0.4 mg by mouth daily.     Marland Kitchen  ticagrelor (BRILINTA) 90 MG TABS tablet Take 1 tablet (90 mg total) by mouth 2 (two) times daily. 180 tablet 3  . traMADol (ULTRAM) 50 MG tablet TAKE ONE (1) TABLET EVERY 6 HOURS 40 tablet 0  . traMADol (ULTRAM) 50 MG tablet Take 50 mg by mouth every 6 (six) hours as needed (for pain).    . vitamin B-12 (CYANOCOBALAMIN) 1000 MCG tablet Take 1,000 mcg by mouth daily.     No current facility-administered medications for this visit.    Past Medical History  Diagnosis Date  .  Coronary artery disease     a. 11/2014 NSTEMI: DESx 2 placed to LAD and RCA   . Mitral valve prolapse   . Diabetes mellitus, type II ([place])     a. HgA1c 6.8 in 03/2015  . Mitral regurgitation     a. severe wtih flail leaflet by 2D ECHO (06/05/15)  . SVT (supraventricular tachycardia) ([facility])     a. recurrent, usually responsive to vagal manuevers  . HTN (hypertension)   . Pulmonary HTN ([place])   . Lower extremity edema     a. chronic  . HLD (hyperlipidemia)   . Hypertension   . OA (osteoarthritis)   . ED (erectile dysfunction)   . Chronic fatigue   . Gout   . Spinal stenosis   . Prostate cancer [facility])     Past Surgical History  Procedure Laterality Date  . Cardiac surgery      2 cardiac stents  . Joint replacement      shoulder  . Back surgery x 2    . Rt rotator cuff repair    . Cervical spine surgery    . Back surgery    . Left heart catheterization with coronary angiogram N/A 11/16/2014    Procedure: LEFT HEART CATHETERIZATION WITH CORONARY ANGIOGRAM;  Surgeon: Troy Sine, MD;  Location: [facility] CATH LAB;  Service: Cardiovascular;  Laterality: N/A;  . Tee without cardioversion N/A 06/22/2015    Procedure: TRANSESOPHAGEAL ECHOCARDIOGRAM (TEE);  Surgeon: Skeet Latch, MD;  Location: [facility] ENDOSCOPY;  Service: Cardiovascular;  Laterality: N/A;     ROS: Some difficulty swallowing, leg swelling. Otherwise as stated in the HPI and negative for all other systems.  PHYSICAL EXAM BP 138/72 mmHg  Pulse 98  Ht 5\' 10"  (1.778 m)  Wt 231 lb 1.6 oz (104.826 kg)  BMI 33.16 kg/m2 GENERAL:  Well appearing HEENT:  Pupils equal round and reactive, fundi not visualized, oral mucosa unremarkable, dentures NECK:  Positive jugular venous distention, waveform within normal limits, carotid upstroke brisk and symmetric, no bruits, no thyromegaly LYMPHATICS:  No cervical, inguinal adenopathy LUNGS:  Clear to auscultation bilaterally BACK:  No CVA tenderness CHEST:  Unremarkable HEART:  PMI not  displaced or sustained,S1 and S2 within normal limits, no S3, no S4, no clicks, no rubs, 3/6 apical systolic murmur radiating to the axilla, and no diastolic murmurs ABD:  Flat, positive bowel sounds normal in frequency in pitch, no bruits, no rebound, no guarding, no midline pulsatile mass, no hepatomegaly, no splenomegaly EXT:  2 plus pulses upper but decreased dorsalis pedis and posterior tibialis bilateral, moderate bilateral lower extremity edema, no cyanosis no clubbing NEURO:  Nonfocal  EKG:   Sinus rhythm, rate 74 poor anterior R wave progression, no acute ST-T wave changes 07/05/15  ASSESSMENT AND PLAN  Severe MR:  I had a long discussion with the patient about the nature of his .valve regurgitation from probable ruptured cord.  He would need  to have valve repair which would be higher risk given his advanced age and elevated pulmonary pressures and comorbid illnesses. However, I think he should be considered for this as he is functional. He raised a large garden this year. He would want to consider this. Therefore, I will send him to Dr. Roxy Manns for further evaluation. For now he will continue the meds as listed. I will defer right heart catheterization that he would need this prior surgical repair to further evaluate his pulmonary pressures.  SVT-  He will continue with conservative therapy unless he has significant recurrence on responsive to vagal maneuvers and beta blockers.  HFpEF/severe LVH/mod MR/pHTN (RVSP 61mmHg) -  He seems to be euvolemic. He will continue the meds as listed.    Elevated troponin/CAD-   He had predominantly fixed inferior defect on recent stress testing he's not having any chest pain. I would suggest repeat left heart catheterization prior to valve repair as well  No change in therapy is indicated.  DM- HgA1 was 6.8.  I will defer to [place], MD  Possibly hypothyroidism-   He has had a mildly depressed T3 and normal T4.  Last TSH was normal in 11/2014.   I will defer to [place], MD  HLD- LDL 80. Goal <70 with CAD. We placed him on statin.  I will follow a lipid profile in the future.    (Greater than 40 minutes reviewing all data with greater than 50% face to face with the patient).

## 2015-07-13 ENCOUNTER — Other Ambulatory Visit (INDEPENDENT_AMBULATORY_CARE_PROVIDER_SITE_OTHER): Payer: Medicare Other

## 2015-07-13 ENCOUNTER — Other Ambulatory Visit: Payer: Self-pay | Admitting: *Deleted

## 2015-07-13 ENCOUNTER — Institutional Professional Consult (permissible substitution) (INDEPENDENT_AMBULATORY_CARE_PROVIDER_SITE_OTHER): Payer: Medicare Other | Admitting: Thoracic Surgery (Cardiothoracic Vascular Surgery)

## 2015-07-13 ENCOUNTER — Encounter: Payer: Self-pay | Admitting: Thoracic Surgery (Cardiothoracic Vascular Surgery)

## 2015-07-13 VITALS — BP 129/71 | HR 95 | Resp 20 | Ht 70.0 in | Wt 231.0 lb

## 2015-07-13 DIAGNOSIS — I10 Essential (primary) hypertension: Secondary | ICD-10-CM | POA: Diagnosis not present

## 2015-07-13 DIAGNOSIS — I340 Nonrheumatic mitral (valve) insufficiency: Secondary | ICD-10-CM

## 2015-07-13 DIAGNOSIS — I5032 Chronic diastolic (congestive) heart failure: Secondary | ICD-10-CM

## 2015-07-13 DIAGNOSIS — I471 Supraventricular tachycardia: Secondary | ICD-10-CM

## 2015-07-13 DIAGNOSIS — I7409 Other arterial embolism and thrombosis of abdominal aorta: Secondary | ICD-10-CM

## 2015-07-13 DIAGNOSIS — I2510 Atherosclerotic heart disease of native coronary artery without angina pectoris: Secondary | ICD-10-CM

## 2015-07-13 DIAGNOSIS — I719 Aortic aneurysm of unspecified site, without rupture: Secondary | ICD-10-CM

## 2015-07-13 LAB — BASIC METABOLIC PANEL
BUN: 12 mg/dL (ref 7–25)
CHLORIDE: 99 mmol/L (ref 98–110)
CO2: 28 mmol/L (ref 20–31)
CREATININE: 1.03 mg/dL (ref 0.70–1.11)
Calcium: 9.5 mg/dL (ref 8.6–10.3)
Glucose, Bld: 111 mg/dL — ABNORMAL HIGH (ref 65–99)
Potassium: 3.6 mmol/L (ref 3.5–5.3)
Sodium: 139 mmol/L (ref 135–146)

## 2015-07-13 NOTE — Progress Notes (Signed)
[name]       [name],[name] [id number]             [phone]     CARDIOTHORACIC SURGERY CONSULTATION REPORT  Referring Provider is Fred Breeding, MD PCP is Fred Fraction, MD  Chief Complaint  Patient presents with  . Mitral Regurgitation    Surgical eval, TEE 06/22/15, Cardiac Cath 11/16/14    HPI:  Patient is an 81 year old male with long-standing history of mitral valve prolapse with mitral regurgitation and chronic diastolic congestive heart failure, coronary artery disease status post PCI and stenting of the LAD and RCA using drug-eluting stents, recurrent SVT, pulmonary hypertension, type 2 diabetes mellitus, hyperlipidemia, and chronic back pain who has been referred for surgical consultation to discuss treatment options for management of severe symptomatic primary mitral regurgitation.  The patient states that he has been told that he had a heart murmur for nearly all of his life.  For at least the past 10 years the patient has also had intermittent history of palpitations tCanal to care with his heart ratehat were eventually diagnosed as paroxysmal SVT.  He has been followed by [name] since 2014 with primary complaints of exertional fatigue and lower extremity edema.  Transthoracic echocardiogram performed April 2014 was reported to demonstrate the presence of mild mitral regurgitation with normal left ventricular systolic function.  The patient was admitted to the hospital in April 2016 with acute exacerbation of chronic diastolic congestive heart failure with primary complaint of worsening lower extremity edema despite escalating doses of diuretic therapy. Transthoracic echocardiogram performed at that time revealed the presence of a mobile mass on the each relief of the mitral valve that was felt to be li, coronary lobekely chronic flail segment with at least moderate mitral regurgitation. Transesophageal echocardiogram was recommended.  Troponin  levels were slightly elevated but remained flat during that admission. Nuclear stress test was felt to be intermediate risk. The patient underwent diagnostic cardiac catheterization by Dr. Claiborne Bradshaw. He was found to have severe two-vessel coronary artery disease and treated with multivessel PCI and stenting using drug eluding stents in both the LAD and RCA territories. Right heart catheterization was not performed.  Transesophageal echocardiogram was not performed. The patient was not referred for surgical consultation at that time.  The patient states that he has not felt any better since he underwent PCI and stenting.  He continues to complain primarily of increasing fatigue with increasing bilateral lower extremity edema. He also reports some exertional shortness of breath. He denies any history of resting shortness of breath, PND, orthopnea, or lower extremity edema. He was seen in follow-up with [name] in late October and subsequent transthoracic echocardiogram revealed normal left ventricular systolic function with likely flail segment of the anterior leaflet of the mitral valve and severe mitral regurgitation.  Shortly after the echocardiogram was performed the patient was hospitalized briefly with an episode of persistent SVT that failed adenosine and required DC cardioversion.  The patient was referred to electrophysiology and seen in consultation by Dr. Curt Bradshaw.  He subsequently underwent transesophageal echocardiogram by Dr. Oval Bradshaw on 06/22/2015.  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 There was normal left ventricular systolic function with ejection Bradshaw estimated 60-65%. There was no sign of thrombus in the left atrial cavity or left atrial appendage. There was normal right ventricular size and systolic function.  There was mild tricuspid regurgitation. The patient was referred  for surgical consultation.  The patient is married and lives with his wife in [place].  He has been disabled for more than 20 years secondary to chronic pain in his back. He has remained fairly active physically and functionally independent. He states that his back does not other him too much at this time but that he is limited primarily by chronic exertional fatigue. He describes dyspnea with moderately strenuous activity. He denies any resting shortness of breath, PND, orthopnea, dizzy spells, or syncope. He's never had any chest pain or chest tightness with exertion. He has moderate to severe chronic bilateral lower extremity edema. He has an intermittent dry nonproductive cough.    Past Medical History  Diagnosis Date  . Coronary artery disease     a. 11/2014 NSTEMI: DESx 2 placed to LAD and RCA   . Mitral valve prolapse   . Diabetes mellitus, type II ([place])     a. HgA1c 6.8 in 03/2015  . Mitral regurgitation     a. severe wtih flail leaflet by 2D ECHO (06/05/15)  . SVT (supraventricular tachycardia) ([facility])     a. recurrent, usually responsive to vagal manuevers  . HTN (hypertension)   . Pulmonary HTN ([place])   . Lower extremity edema     a. chronic  . HLD (hyperlipidemia)   . Hypertension   . OA (osteoarthritis)   . ED (erectile dysfunction)   . Chronic fatigue   . Gout   . Spinal stenosis   . Prostate cancer ([place])   . Severe mitral regurgitation 11/17/2014    Mitral valve prolapse with flail segment of anterior leaflet   . Chronic diastolic (congestive) heart failure [facility])     Past Surgical History  Procedure Laterality Date  . Cardiac surgery      2 cardiac stents  . Joint replacement      shoulder  . Back surgery x 2    . Rt rotator cuff repair    . Cervical spine surgery    . Back surgery    . Left heart catheterization with coronary angiogram N/A 11/16/2014    Procedure: LEFT HEART CATHETERIZATION WITH CORONARY ANGIOGRAM;  Surgeon: Fred Sine, MD;  Location:  [facility] CATH LAB;  Service: Cardiovascular;  Laterality: N/A;  . Tee without cardioversion N/A 06/22/2015    Procedure: TRANSESOPHAGEAL ECHOCARDIOGRAM (TEE);  Surgeon: Skeet Latch, MD;  Location: [facility] ENDOSCOPY;  Service: Cardiovascular;  Laterality: N/A;    Family History  Problem Relation Age of Onset  . Heart failure Mother 68  . Heart attack Mother   . Heart attack Brother     Social History   Social History  . Marital Status: Married    Spouse Name: N/A  . Number of Children: 4  . Years of Education: N/A   Occupational History  .     Social History Main Topics  . Smoking status: Former Research scientist (life sciences)  . Smokeless tobacco: Never Used     Comment: QUIT SMOKING  MANY YEARS AGO"  . Alcohol Use: No  . Drug Use: No  . Sexual Activity: Not on file   Other Topics Concern  . Not on file   Social History Narrative   ** Merged History Encounter **       Four adopted children.  Lives alone.     Current Outpatient Prescriptions  Medication Sig Dispense Refill  . amLODipine (NORVASC) 5 MG tablet Take 1 tablet (5 mg  total) by mouth daily. 30 tablet 11  . aspirin EC 81 MG tablet Take 81 mg by mouth daily.    . diclofenac sodium (VOLTAREN) 1 % GEL Apply 1 application topically 3 (three) times daily as needed (pain).     Marland Kitchen diltiazem (CARDIZEM CD) 120 MG 24 hr capsule Take 1 capsule (120 mg total) by mouth daily. 30 capsule 6  . finasteride (PROSCAR) 5 MG tablet Take 1 tablet (5 mg total) by mouth daily. 30 tablet 2  . fluticasone (FLONASE) 50 MCG/ACT nasal spray Place 1 spray into both nostrils daily as needed for allergies or rhinitis.    . furosemide (LASIX) 40 MG tablet Take 1.5 tablets (60 mg total) by mouth 2 (two) times daily. 120 tablet 5  . Menthol-Methyl Salicylate (MUSCLE RUB) 10-15 % CREA Apply 1 application topically 2 (two) times daily as needed for muscle pain.     . metoprolol (LOPRESSOR) 50 MG tablet Take 50 mg by mouth 2 (two) times daily.    . potassium chloride SA  (K-DUR,KLOR-CON) 20 MEQ tablet Take 2 tablets (40 mEq total) by mouth daily. 60 tablet 11  . pravastatin (PRAVACHOL) 40 MG tablet Take 1 tablet (40 mg total) by mouth daily at 6 PM. 30 tablet 11  . tamsulosin (FLOMAX) 0.4 MG CAPS capsule Take 0.4 mg by mouth daily.     . ticagrelor (BRILINTA) 90 MG TABS tablet Take 1 tablet (90 mg total) by mouth 2 (two) times daily. 180 tablet 3  . traMADol (ULTRAM) 50 MG tablet TAKE ONE (1) TABLET EVERY 6 HOURS 40 tablet 0  . vitamin B-12 (CYANOCOBALAMIN) 1000 MCG tablet Take 1,000 mcg by mouth daily.     No current facility-administered medications for this visit.    No Known Allergies    Review of Systems:   General:  decreased appetite, decreased energy, no weight gain, no weight loss, no fever  Cardiac:  no chest pain with exertion, no chest pain at rest, + SOB with exertion, no resting SOB, no PND, no orthopnea, no palpitations, + arrhythmia, no atrial fibrillation, + chronic LE edema, no dizzy spells, no syncope  Respiratory:  + exertional shortness of breath, no home oxygen, no productive cough, intermittent dry cough, no bronchitis, no wheezing, no hemoptysis, no asthma, no pain with inspiration or cough, no sleep apnea, no CPAP at night  GI:   mild difficulty swallowing, no reflux, no frequent heartburn, no hiatal hernia, no abdominal pain, + constipation, no diarrhea, no hematochezia, no hematemesis, no melena  GU:   no dysuria,  no frequency, no urinary tract infection, no hematuria, no enlarged prostate, no kidney stones, no kidney disease  Vascular:  no pain suggestive of claudication, no pain in feet, occasional leg cramps, no varicose veins, no DVT, no non-healing foot ulcer  Neuro:   no stroke, no TIA's, no seizures, no headaches, no temporary blindness one eye,  no slurred speech, no peripheral neuropathy, + chronic pain, no instability of gait, no memory/cognitive dysfunction  Musculoskeletal: + arthritis, no joint swelling, no myalgias, no  difficulty walking, normal mobility   Skin:   no rash, no itching, no skin infections, no pressure sores or ulcerations  Psych:   no anxiety, no depression, no nervousness, no unusual recent stress  Eyes:   no blurry vision, no floaters, no recent vision changes, + wears glasses or contacts  ENT:   + hearing loss, no loose or painful teeth, no dentures, last saw dentist several years ago  Hematologic:  + easy bruising, no abnormal bleeding, no clotting disorder, no frequent epistaxis  Endocrine:  + diabetes, does not check CBG's at home     Physical Exam:   BP 129/71 mmHg  Pulse 95  Resp 20  Ht _0  (1.778 m)  Wt 231 lb (104.781 kg)  BMI 33.15 kg/m2  SpO2 97%  General:    well-appearing  HEENT:  Unremarkable   Neck:   no JVD, no bruits, no adenopathy   Chest:   clear to auscultation, symmetrical breath sounds, no wheezes, no rhonchi   CV:   RRR, grade III/VI holosystolic murmur   Abdomen:  soft, non-tender, no masses   Extremities:  warm, well-perfused, pulses diminished, + 3+ bilateral LE edema  Rectal/GU  Deferred  Neuro:   Grossly non-focal and symmetrical throughout  Skin:   Clean and dry, no rashes, no breakdown   Diagnostic Tests:  Transthoracic Echocardiography  Patient:  [name], [name] MR #:    [id number] Study Date: 11/16/2014 Gender:   M Age:    36 Height:   175.3 cm Weight:   104.3 kg BSA:    2.29 m^2 Pt. Status: Room:    3E28C  ADMITTING  Jenkins Rouge, M.D. ATTENDING  Jenkins Rouge, M.D. ORDERING   Isaiah Serge REFERRING  Cecilie Kicks R SONOGRAPHER Diamond Nickel PERFORMING  [facility], Inpatient  cc:  ------------------------------------------------------------------- LV EF: 55% -  60%  ------------------------------------------------------------------- Indications:   CHF - 428.0. Mitral regurgitation 424.0.  ------------------------------------------------------------------- History:  PMH: Obesity.  Pulmonary hypertension. Peripheral edema. Metabolic syndrome. Elevated Troponin. Chronic renal disease. Spinal stenosis. Risk factors: Hypertension. Dyslipidemia.  ------------------------------------------------------------------- Study Conclusions  - Left ventricle: The cavity size was normal. Wall thickness was increased in a pattern of severe LVH. Systolic function was normal. The estimated ejection Bradshaw was in the range of 55% to 60%. - Aortic valve: There was mild regurgitation. - Mitral valve: Similar to 2014 there is a clacified mobile mass on anterior leaflet I think this is a chronic flail segment with at least moderate MR Suggest inpateint TEE. There was moderate regurgitation. - Left atrium: The atrium was moderately dilated. - Atrial septum: No defect or patent foramen ovale was identified. - Pulmonary arteries: PA peak pressure: 57 mm Hg (S).  Transthoracic echocardiography. M-mode, complete 2D, spectral Doppler, and color Doppler. Birthdate: Patient birthdate: [birth year]. Age: Patient is 81 yr old. Sex: Gender: male. BMI: 34 kg/m^2. Blood pressure:   128/63 Patient status: Inpatient. Study date: Study date: 11/16/2014. Study time: 09:37 AM. Location: Echo laboratory.  -------------------------------------------------------------------  ------------------------------------------------------------------- Left ventricle: The cavity size was normal. Wall thickness was increased in a pattern of severe LVH. Systolic function was normal. The estimated ejection Bradshaw was in the range of 55% to 60%.  ------------------------------------------------------------------- Aortic valve:  Mildly thickened, moderately calcified leaflets. Doppler: There was mild regurgitation.  ------------------------------------------------------------------- Mitral valve: Similar to 2014 there is a clacified mobile mass on anterior leaflet I think this  is a chronic flail segment with at least moderate MR Suggest inpateint TEE. Doppler: There was moderate regurgitation.  Peak gradient (D): 4 mm Hg.  ------------------------------------------------------------------- Left atrium: The atrium was moderately dilated.  ------------------------------------------------------------------- Atrial septum: No defect or patent foramen ovale was identified.  ------------------------------------------------------------------- Right ventricle: The cavity size was normal. Wall thickness was normal. Systolic function was normal.  ------------------------------------------------------------------- Pulmonic valve:  Structurally normal valve.  Cusp separation was normal. Doppler: Transvalvular velocity was within the normal range. There was mild regurgitation.  ------------------------------------------------------------------- Tricuspid  valve:  Doppler: There was mild regurgitation.  ------------------------------------------------------------------- Right atrium: The atrium was normal in size.  ------------------------------------------------------------------- Pericardium: The pericardium was normal in appearance.  ------------------------------------------------------------------- Measurements  Left ventricle               Value    Reference LV ID, ED, PLAX chordal       (L)   34.5 mm   43 - 52 LV ID, ES, PLAX chordal       (L)   22.6 mm   23 - 38 LV fx shortening, PLAX chordal       34  %   >=29 LV PW thickness, ED             19.2 mm   --------- IVS/LV PW ratio, ED             0.87     <=1.3 LV e&', lateral               6.31 cm/s  --------- LV E/e&', lateral              15.52    --------- LV e&', medial                4.9  cm/s  --------- LV E/e&', medial                19.98    --------- LV e&', average               5.61 cm/s  --------- LV E/e&', average              17.47    ---------  Ventricular septum             Value    Reference IVS thickness, ED              16.7 mm   ---------  LVOT                    Value    Reference LVOT ID, S                 21  mm   --------- LVOT area                  3.46 cm^2  ---------  Aortic valve                Value    Reference Aortic regurg pressure half-time      347  ms   ---------  Aorta                    Value    Reference Aortic root ID, ED             33  mm   ---------  Left atrium                 Value    Reference LA ID, A-P, ES               48  mm   --------- LA ID/bsa, A-P               2.1  cm/m^2 <=2.2 LA volume, S                136  ml   --------- LA volume/bsa, S              59.4 ml/m^2 --------- LA volume, ES, 1-p  A4C           121  ml   --------- LA volume/bsa, ES, 1-p A4C         52.8 ml/m^2 --------- LA volume, ES, 1-p A2C           147  ml   --------- LA volume/bsa, ES, 1-p A2C         64.2 ml/m^2 ---------  Mitral valve                Value    Reference Mitral E-wave peak velocity         97.9 cm/s  --------- Mitral A-wave peak velocity         44.1 cm/s  --------- Mitral deceleration time      (L)   130  ms   150 - 230 Mitral peak gradient, D           4   mm Hg --------- Mitral E/A ratio, peak           2.2     --------- Mitral maximal regurg velocity,       510  cm/s  --------- PISA Mitral regurg VTI, PISA            152  cm   ---------  Pulmonary arteries             Value    Reference PA pressure, S, DP         (H)   57  mm Hg <=30  Tricuspid valve               Value    Reference Tricuspid regurg peak velocity       324  cm/s  --------- Tricuspid peak RV-RA gradient        42  mm Hg ---------  Systemic veins               Value    Reference Estimated CVP                15  mm Hg ---------  Right ventricle               Value    Reference RV pressure, S, DP         (H)   57  mm Hg <=30 RV s&', lateral, S              13.2 cm/s  ---------  Pulmonic valve               Value    Reference Pulmonic regurg velocity, ED        62.4 cm/s  ---------  Legend: (L) and (H) mark values outside specified reference range.  ------------------------------------------------------------------- Prepared and Electronically Authenticated by  Jenkins Rouge, M.D. 2016-04-13T11:40:18     CARDIAC CATHETERIZATION/PERCUTANEOUS CORONARY INTERVENTION   HISTORY:   ZEEK Bradshaw is a 81 y.o. male   PROCEDURE: Left heart catheterization: coronary angiography, left ventriculography, 2 vessel percutaneous coronary intervention with PTCA/DES stenting of the mid LAD and the proximal RCA.  The patient was brought to the [facility] cardiac catherization laboratory in the fasting state. He was premedicated with Versed 2 mg and fentanyl 25 g. His right groin was prepped and shaved in usual sterile fashion. Xylocaine 1% was used for local anesthesia. A 5 French sheath was inserted into the R femoral artery. Diagnostic catheterizatiion was done with 5 [place] FL5, FR4, and pigtail catheters. Left ventriculography was done  with 27 cc Omnipaque contrast. With the demonstration of a 95% focal stenosis in the mid  LAD and a 95% proximal RCA stenosis. The decision was made to proceed with two-vessel percutaneous coronary intervention. The arterial sheath was upgraded to a 6 [place] sheath. Angiomax bolus plus infusion was administered. Brilinta 180 mg was administered orally. Attention was initially directed at the LAD. A 6 French XB LAD 4.0 guide was used. A Prowater wire was advanced down the LAD. Dilatation was done with a 2.512 mm Euphora balloon. A Xience Alpine 3.018 mm DES stent was then deployed for 2 inflations at 14 atm. An [place] Euphora for 3.2512 mm balloon was used for post stent dilatation up to 3.21 mm. Angiography confirmed an excellent angiographic result with the stenosis being reduced to 0%. Attention was then directed at the RCA. A 6 [place] FR4 guide was used for the intervention. The same Prowater and Euphora balloon were used and predilatation was done at 8 and 10 atm. A Xience Alpine 3.015 mm DES stent was deployed at 11 and 12 atm. The same 3.2512 [place] Euphora balloon was used for post stent dilatation at 10 and 11 atm up to 3.18 mm. Angiography confirmed an excellent angiographic result. All catheters were removed and the patient. The arterial sheath was sutured in place. The patient will be maintained on reduced dose bivalirudin for 2 hours post procedure  HEMODYNAMICS:  Central Aorta: 140/63  Left Ventricle: 140/11/24  ANGIOGRAPHY:  Left main: Angiographically normal vessel which bifurcated into the LAD and left circumflex vessel.   LAD: Large caliber vessel that gave rise to a proximal septal and diagonal branch. The LAD after the diagonal vessel had a focal 95% stenosis. The remainder of the LAD was free of significant disease and wrapped around the LV apex.  Left circumflex: Large caliber normal vessel which gave rise to one major obtuse marginal branch.   Right coronary artery: Large caliber dominant vessel that had a focal 95% proximal stenosis after a  moderate sized proximal branch. The vessel ended in the PDA and PLA vessel.   Left ventriculography revealed preserved global LV contractility with an ejection Bradshaw of 55-60%. There appeared to be 2-3+ angiographic mitral regurgitation.  Percutaneous coronary intervention:  The 95% LAD stenosis was reduced to 0% with insertion of a Xience Alpine 3.018 mm DES stent postdilated to 3.21 mm.  The 95% proximal RCA stenosis was reduced to 0% with insertion of a Xience Alpine 3.015 mm DES stent postdilated to 3.18 mm.  IMPRESSION:  Normal LV function with an ejection Bradshaw of 55-60% and evidence for 2-3+ angiographic mitral regurgitation.  Two-vessel coronary obstructive disease with focal 95% mid LAD stenosis and focal proximal 95% RCA stenosis and a large dominant RCA and a normal left circumflex coronary artery.  Successful two-vessel percutaneous coronary intervention to the LAD and RCA with both 95% stenoses being reduced to 0% following insertion of Xience Alpine 3.018 mm DES stent in the LAD and 3.015 mm DES stent in the RCA.  RECOMMENDATION:  The patient will be maintained on dual antiplatelet therapy for minimum of 1 year. He will be hydrated postprocedure. Aggressive statin therapy. Medical therapy for CAD/hypertension.  Fred Sine, MD, [facility] 11/16/2014 7:39 PM    Transthoracic Echocardiography  Patient:  [name], [name] MR #:    [id number] Study Date: 06/05/2015 Gender:   M Age:    80 Height:   177.8 cm Weight:   104.8 kg BSA:    2.31  m^2 Pt. Status: Room:    2W10C  ATTENDING  Carmin Muskrat [id number] SONOGRAPHER Donata Clay ADMITTING  Friedman, Tr[place]ble  [facility], Inpatient ORDERING   Eileen Stanford REFERRING  Angelena Form R  cc:  ------------------------------------------------------------------- LV EF: 75% -   80%  ------------------------------------------------------------------- Indications:   CAD of native vessels 414.01.  ------------------------------------------------------------------- History:  PMH: SVT Lactic acidosis. Moderate MR with a possible mass. PMH:  Myocardial infarction.  ------------------------------------------------------------------- Study Conclusions  - Left ventricle: The cavity size was normal. There was moderate concentric hypertrophy. Systolic function was hyperdynamic. The estimated ejection Bradshaw was in the range of 75% to 80%. Wall motion was normal; there were no regional wall motion abnormalities. Features are consistent with a pseudonormal left ventricular filling pattern, with concomitant abnormal relaxation and increased filling pressure (grade 2 diastolic dysfunction). - Aortic valve: There was mild regurgitation. - Mitral valve: Calcified annulus. Mildly thickened leaflets . Flail motion involving a segment of the anterior leaflet. This causes the appearance of a &quot;mass&quot; on the anterior leaflet. There was severe regurgitation directed eccentrically and posteriorly. Severe regurgitation is suggested by pulmonary vein systolic flow reversal. - Left atrium: The atrium was severely dilated. - Right ventricle: The cavity size was mildly dilated. Wall thickness was normal. - Right atrium: The atrium was moderately to severely dilated. - Tricuspid valve: There was moderate regurgitation. - Pulmonic valve: There was moderate regurgitation. - Pulmonary arteries: Systolic pressure was moderately increased. PA peak pressure: 46 mm Hg (S).  Transthoracic echocardiography. M-mode, complete 2D, spectral Doppler, and color Doppler. Birthdate: Patient birthdate: 1929/05/22. Age: Patient is 81 yr old. Sex: Gender: male. BMI: 33.1 kg/m^2. Blood pressure:   125/71 Patient status: Inpatient. Study date:  Study date: 06/05/2015. Study time: 01:12 PM. Location: Bedside.  -------------------------------------------------------------------  ------------------------------------------------------------------- Left ventricle: The cavity size was normal. There was moderate concentric hypertrophy. Systolic function was hyperdynamic. The estimated ejection Bradshaw was in the range of 75% to 80%. Wall motion was normal; there were no regional wall motion abnormalities. Features are consistent with a pseudonormal left ventricular filling pattern, with concomitant abnormal relaxation and increased filling pressure (grade 2 diastolic dysfunction).  ------------------------------------------------------------------- Aortic valve:  Trileaflet; mildly thickened, mildly calcified leaflets. Sclerosis without stenosis. Doppler: There was mild regurgitation.  ------------------------------------------------------------------- Mitral valve:  Calcified annulus. Mildly thickened leaflets . Flail motion involving a segment of the anterior leaflet. This causes the appearance of a &quot;mass&quot; on the anterior leaflet. Doppler: There was severe regurgitation directed eccentrically and posteriorly. Severe regurgitation is suggested by pulmonary vein systolic flow reversal.  Peak gradient (D): 3 mm Hg.  ------------------------------------------------------------------- Left atrium: The atrium was severely dilated.  ------------------------------------------------------------------- Right ventricle: The cavity size was mildly dilated. Wall thickness was normal. Systolic function was normal.  ------------------------------------------------------------------- Pulmonic valve:  Poorly visualized. Doppler: There was moderate regurgitation.  ------------------------------------------------------------------- Tricuspid valve:  Structurally normal valve.  Leaflet separation was normal.  Doppler: Transvalvular velocity was within the normal range. There was moderate regurgitation.  ------------------------------------------------------------------- Pulmonary artery:  Systolic pressure was moderately increased.  ------------------------------------------------------------------- Right atrium: The atrium was moderately to severely dilated.  ------------------------------------------------------------------- Pericardium: There was no pericardial effusion.  ------------------------------------------------------------------- Measurements  Left ventricle              Value    Reference LV ID, ED, PLAX chordal     (L)   35.9 mm   43 - 52 LV ID, ES, PLAX chordal         25.6 mm   23 -  38 LV fx shortening, PLAX chordal      29  %   >=29 LV PW thickness, ED           17.7 mm   --------- IVS/LV PW ratio, ED           1.04     <=1.3 LV ejection Bradshaw, 1-p A4C      62  %   --------- LV end-diastolic volume, 2-p       184  ml   --------- LV end-systolic volume, 2-p       71  ml   --------- LV ejection Bradshaw, 2-p        62  %   --------- Stroke volume, 2-p            114  ml   --------- LV end-diastolic volume/bsa, 2-p     80  ml/m^2 --------- LV end-systolic volume/bsa, 2-p     31  ml/m^2 --------- Stroke volume/bsa, 2-p          49.4 ml/m^2 --------- LV e&', lateral              4.88 cm/s  --------- LV E/e&', lateral             18.59    --------- LV e&', medial              3.57 cm/s  --------- LV E/e&', medial             25.41    --------- LV e&', average              4.23 cm/s  --------- LV E/e&', average             21.47    ---------  Ventricular septum            Value     Reference IVS thickness, ED            18.4 mm   ---------  LVOT                   Value    Reference LVOT ID, S                20  mm   --------- LVOT area                3.14 cm^2  ---------  Aorta                  Value    Reference Aortic root ID, ED            30  mm   ---------  Left atrium               Value    Reference LA ID, A-P, ES              52  mm   --------- LA ID/bsa, A-P          (H)   2.25 cm/m^2 <=2.2 LA volume, S               182  ml   --------- LA volume/bsa, S             78.8 ml/m^2 --------- LA volume, ES, 1-p A4C          125  ml   --------- LA volume/bsa, ES, 1-p A4C        54.1 ml/m^2 --------- LA volume, ES, 1-p A2C  218  ml   --------- LA volume/bsa, ES, 1-p A2C        94.4 ml/m^2 ---------  Mitral valve               Value    Reference Mitral E-wave peak velocity       90.7 cm/s  --------- Mitral A-wave peak velocity       44.6 cm/s  --------- Mitral deceleration time     (H)   239  ms   150 - 230 Mitral peak gradient, D         3   mm Hg --------- Mitral E/A ratio, peak          2      ---------  Pulmonary arteries            Value    Reference PA pressure, S, DP        (H)   46  mm Hg <=30  Tricuspid valve             Value    Reference Tricuspid regurg peak velocity      329  cm/s  --------- Tricuspid peak RV-RA gradient      43  mm Hg ---------  Systemic veins              Value    Reference Estimated CVP              3   mm Hg ---------  Right ventricle             Value    Reference TAPSE                   31.7 mm   --------- RV pressure, S, DP        (H)   46  mm Hg <=30 RV s&', lateral, S            11.6 cm/s  ---------  Legend: (L) and (H) mark values outside specified reference range.  ------------------------------------------------------------------- Prepared and Electronically Authenticated by  Sanda Klein, MD 2016-10-31T14:53:56   Transesophageal Echocardiography  Patient:  [name], Fred MR #:    [id number] Study Date: 06/22/2015 Gender:   M Age:    71 Height:   175.3 cm Weight:   106.8 kg BSA:    2.32 m^2 Pt. Status: Room:  SONOGRAPHER Joanie Coddington, [place], MD [place], MD [place], MD PERFORMING  Skeet Latch, MD [place], MD  cc:  ------------------------------------------------------------------- LV EF: 60% -  65%  ------------------------------------------------------------------- Indications:   Mitral regurgitation 424.0.  ------------------------------------------------------------------- Study Conclusions  - Left ventricle: Systolic function was normal. The estimated ejection Bradshaw was in the range of 60% to 65%. Wall motion was normal; there were no regional wall motion abnormalities. - Aortic valve: There was mild regurgitation. 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 There was severe regurgitation directed posteriorly. - Left atrium: No evidence of thrombus in the atrial cavity or appendage. No evidence of thrombus in the atrial cavity or appendage. - Right atrium: No evidence of thrombus in the atrial cavity or appendage. - Atrial septum: The septum bowed from left to right, consistent with increased left atrial pressure.  Diagnostic transesophageal echocardiography. 2D and color  Doppler. Birthdate: Patient birthdate: 1929-02-08. Age: Patient is 81 yr old. Sex: Gender: male.  BMI: 34.8 kg/m^2. Blood pressure: 154/73 Patient status:  Outpatient. Study date: Study date: 06/22/2015. Study time: 10:51 AM. Location: Endoscopy.  -------------------------------------------------------------------  ------------------------------------------------------------------- Left ventricle: Systolic function was normal. The estimated ejection Bradshaw was in the range of 60% to 65%. Wall motion was normal; there were no regional wall motion abnormalities.  ------------------------------------------------------------------- Aortic valve:  Trileaflet; normal thickness, mildly calcified leaflets. Cusp separation was normal. Doppler: There was mild regurgitation.  ------------------------------------------------------------------- Aorta: There was no atheroma. There was no evidence for dissection. Aortic root: The aortic root was not dilated. Ascending aorta: The ascending aorta was normal in size. Aortic arch: The aortic arch was normal in size. Descending aorta: The descending aorta was normal in size.  ------------------------------------------------------------------- Mitral valve: Leaflet separation was normal. Calcified, ruptured cord with flail motion of the A3 segment. Doppler: There was severe regurgitation directed posteriorly.  ------------------------------------------------------------------- Left atrium: The atrium was normal in size. No evidence of thrombus in the atrial cavity or appendage. No evidence of thrombus in the atrial cavity or appendage. The appendage was morphologically a left appendage, multilobulated, and of normal size. Emptying velocity was normal.  ------------------------------------------------------------------- Atrial septum: The septum bowed from left to right, consistent with increased left atrial  pressure.  ------------------------------------------------------------------- Pulmonary veins: Left upper pulmonary vein: There was systolic flow reversal.  ------------------------------------------------------------------- Right ventricle: The cavity size was normal. Wall thickness was normal. Systolic function was normal.  ------------------------------------------------------------------- Pulmonic valve:  Structurally normal valve.  Doppler: There was trivial regurgitation.  ------------------------------------------------------------------- Tricuspid valve:  Structurally normal valve.  Leaflet separation was normal. Doppler: There was mild regurgitation.  ------------------------------------------------------------------- Pulmonary artery:  The main pulmonary artery was normal-sized.  ------------------------------------------------------------------- Right atrium: The atrium was normal in size. No evidence of thrombus in the atrial cavity or appendage. The appendage was morphologically a right appendage.  ------------------------------------------------------------------- Pericardium: There was no pericardial effusion.  ------------------------------------------------------------------- Measurements  Mitral valve                Value Mitral maximal regurg velocity, PISA    590  cm/s Mitral regurg VTI, PISA          152  cm Mitral ERO, PISA              1.44 cm^2 Mitral regurg volume, PISA         219  ml  Tricuspid valve              Value Tricuspid regurg peak velocity       202  cm/s Tricuspid peak RV-RA gradient       16  mm Hg  Legend: (L) and (H) mark values outside specified reference range.  ------------------------------------------------------------------- Prepared and Electronically Authenticated by  Skeet Latch, MD 2016-11-17T17:40:04        Impression:  Patient has stage D severe symptomatic primary mitral regurgitation. He presents with chronic symptoms of exertional fatigue, shortness of breath, and bilateral lower extremity edema consistent with chronic diastolic congestive heart failure, New [place] Heart Association functional class II. He was hospitalized last spring with an acute exacerbation of chronic diastolic congestive heart failure and was treated with PCI and stenting for multivessel coronary artery disease discovered at the time of diagnostic cardiac catheterization. The patient also suffers from a long-standing history of recurrent SVT that recently required brief hospitalization for DC cardioversion.  I have personally reviewed the patient's recent transesophageal echocardiogram and several previous transthoracic echocardiograms. He has an obvious flail segment of the anterior leaflet of the mitral valve with type II dysfunction and severe mitral regurgitation. Left ventricular  systolic function remains preserved. I agree the patient would best be treated with elective mitral valve repair. Risks of surgery may be somewhat increased because of the patient's advanced age, numerous comorbid medical conditions, and need for long-term dual antiplatelet therapy.  Given his history of SVT he will clearly be at high risk for the development of recurrent atrial dysrhythmias following surgery. He benefit from concomitant maze procedure.   Plan:  The patient and his wife were counseled at length regarding the indications, risks and potential benefits of mitral valve repair.  The rationale for elective surgery has been explained, including a comparison between surgery and continued medical therapy with close follow-up.   The patient is interested in proceeding with elective surgery at some point in the near future, but he desires to wait until after the holidays of past. He will need to undergo left and right heart catheterization  prior to surgery. We will obtain CT angiogram of the aorta and iliac vessels to investigate whether or not the patient will have adequate pelvic vascular access for femoral cannulation to facilitate possible minimally invasive approach for surgery.  The patient will return for follow-up once these tests have been completed and potentially make final plans for surgery at that time. All of his questions have been addressed.   I spent in excess of 90 minutes during the conduct of this office consultation and >50% of this time involved direct face-to-face encounter with the patient for counseling and/or coordination of their care.    Valentina Gu. Roxy Manns, MD 07/13/2015 2:35 PM

## 2015-07-13 NOTE — Patient Instructions (Signed)
Continue all previous medications without any changes at this time  Schedule heart cath and CT angiogram to be done prior to your next visit in our office

## 2015-07-17 ENCOUNTER — Encounter: Payer: Medicare Other | Admitting: Thoracic Surgery (Cardiothoracic Vascular Surgery)

## 2015-07-21 ENCOUNTER — Other Ambulatory Visit: Payer: Medicare Other

## 2015-07-26 ENCOUNTER — Encounter: Payer: Self-pay | Admitting: *Deleted

## 2015-08-02 ENCOUNTER — Ambulatory Visit
Admission: RE | Admit: 2015-08-02 | Discharge: 2015-08-02 | Disposition: A | Discharge status: Home / Self Care | Payer: Medicare Other | Source: Ambulatory Visit | Attending: Thoracic Surgery (Cardiothoracic Vascular Surgery) | Admitting: Thoracic Surgery (Cardiothoracic Vascular Surgery)

## 2015-08-02 DIAGNOSIS — I719 Aortic aneurysm of unspecified site, without rupture: Secondary | ICD-10-CM

## 2015-08-02 DIAGNOSIS — I7409 Other arterial embolism and thrombosis of abdominal aorta: Secondary | ICD-10-CM

## 2015-08-02 DIAGNOSIS — D1803 Hemangioma of intra-abdominal structures: Secondary | ICD-10-CM | POA: Insufficient documentation

## 2015-08-02 HISTORY — DX: Hemangioma of intra-abdominal structures: D18.03

## 2015-08-02 MED ORDER — IOPAMIDOL (ISOVUE-370) INJECTION 76%
75.0000 mL | Freq: Once | INTRAVENOUS | Status: AC | PRN
  Administered 2015-08-02: 75 mL via INTRAVENOUS

## 2015-08-04 ENCOUNTER — Other Ambulatory Visit: Payer: Self-pay | Admitting: Family Medicine

## 2015-08-04 NOTE — Telephone Encounter (Signed)
ok 

## 2015-08-04 NOTE — Telephone Encounter (Signed)
Medication called to pharmacy. 

## 2015-08-04 NOTE — Telephone Encounter (Signed)
?   OK to Refill  

## 2015-08-08 ENCOUNTER — Encounter (HOSPITAL_COMMUNITY)
Admission: RE | Disposition: A | Discharge status: Home / Self Care | Payer: Self-pay | Source: Ambulatory Visit | Attending: Cardiovascular Disease

## 2015-08-08 ENCOUNTER — Ambulatory Visit (HOSPITAL_COMMUNITY)
Admission: RE | Admit: 2015-08-08 | Discharge: 2015-08-08 | Disposition: A | Discharge status: Home / Self Care | Payer: Medicare Other | Source: Ambulatory Visit | Attending: Cardiovascular Disease | Admitting: Cardiovascular Disease

## 2015-08-08 DIAGNOSIS — I4510 Unspecified right bundle-branch block: Secondary | ICD-10-CM | POA: Insufficient documentation

## 2015-08-08 DIAGNOSIS — I340 Nonrheumatic mitral (valve) insufficiency: Secondary | ICD-10-CM | POA: Insufficient documentation

## 2015-08-08 DIAGNOSIS — Z87891 Personal history of nicotine dependence: Secondary | ICD-10-CM | POA: Insufficient documentation

## 2015-08-08 DIAGNOSIS — M109 Gout, unspecified: Secondary | ICD-10-CM | POA: Insufficient documentation

## 2015-08-08 DIAGNOSIS — M549 Dorsalgia, unspecified: Secondary | ICD-10-CM | POA: Diagnosis not present

## 2015-08-08 DIAGNOSIS — R5382 Chronic fatigue, unspecified: Secondary | ICD-10-CM | POA: Diagnosis not present

## 2015-08-08 DIAGNOSIS — I491 Atrial premature depolarization: Secondary | ICD-10-CM | POA: Insufficient documentation

## 2015-08-08 DIAGNOSIS — N189 Chronic kidney disease, unspecified: Secondary | ICD-10-CM | POA: Diagnosis not present

## 2015-08-08 DIAGNOSIS — I2510 Atherosclerotic heart disease of native coronary artery without angina pectoris: Secondary | ICD-10-CM | POA: Insufficient documentation

## 2015-08-08 DIAGNOSIS — Z6833 Body mass index (BMI) 33.0-33.9, adult: Secondary | ICD-10-CM | POA: Diagnosis not present

## 2015-08-08 DIAGNOSIS — I5032 Chronic diastolic (congestive) heart failure: Secondary | ICD-10-CM | POA: Diagnosis not present

## 2015-08-08 DIAGNOSIS — I252 Old myocardial infarction: Secondary | ICD-10-CM | POA: Diagnosis not present

## 2015-08-08 DIAGNOSIS — M1990 Unspecified osteoarthritis, unspecified site: Secondary | ICD-10-CM | POA: Insufficient documentation

## 2015-08-08 DIAGNOSIS — Z955 Presence of coronary angioplasty implant and graft: Secondary | ICD-10-CM | POA: Diagnosis not present

## 2015-08-08 DIAGNOSIS — Z7982 Long term (current) use of aspirin: Secondary | ICD-10-CM | POA: Diagnosis not present

## 2015-08-08 DIAGNOSIS — Z8249 Family history of ischemic heart disease and other diseases of the circulatory system: Secondary | ICD-10-CM | POA: Diagnosis not present

## 2015-08-08 DIAGNOSIS — I272 Other secondary pulmonary hypertension: Secondary | ICD-10-CM | POA: Diagnosis not present

## 2015-08-08 DIAGNOSIS — Z8546 Personal history of malignant neoplasm of prostate: Secondary | ICD-10-CM | POA: Diagnosis not present

## 2015-08-08 DIAGNOSIS — E119 Type 2 diabetes mellitus without complications: Secondary | ICD-10-CM | POA: Insufficient documentation

## 2015-08-08 DIAGNOSIS — I341 Nonrheumatic mitral (valve) prolapse: Secondary | ICD-10-CM | POA: Diagnosis not present

## 2015-08-08 DIAGNOSIS — I130 Hypertensive heart and chronic kidney disease with heart failure and stage 1 through stage 4 chronic kidney disease, or unspecified chronic kidney disease: Secondary | ICD-10-CM | POA: Insufficient documentation

## 2015-08-08 DIAGNOSIS — G8929 Other chronic pain: Secondary | ICD-10-CM | POA: Diagnosis not present

## 2015-08-08 DIAGNOSIS — Z7951 Long term (current) use of inhaled steroids: Secondary | ICD-10-CM | POA: Diagnosis not present

## 2015-08-08 DIAGNOSIS — I471 Supraventricular tachycardia: Secondary | ICD-10-CM | POA: Diagnosis not present

## 2015-08-08 DIAGNOSIS — E669 Obesity, unspecified: Secondary | ICD-10-CM | POA: Diagnosis not present

## 2015-08-08 DIAGNOSIS — E785 Hyperlipidemia, unspecified: Secondary | ICD-10-CM | POA: Insufficient documentation

## 2015-08-08 HISTORY — PX: CARDIAC CATHETERIZATION: SHX172

## 2015-08-08 LAB — CBC
HEMATOCRIT: 41.2 % (ref 39.0–52.0)
HEMOGLOBIN: 14 g/dL (ref 13.0–17.0)
MCH: 31.9 pg (ref 26.0–34.0)
MCHC: 34 g/dL (ref 30.0–36.0)
MCV: 93.8 fL (ref 78.0–100.0)
Platelets: 136 10*3/uL — ABNORMAL LOW (ref 150–400)
RBC: 4.39 MIL/uL (ref 4.22–5.81)
RDW: 13.4 % (ref 11.5–15.5)
WBC: 7 10*3/uL (ref 4.0–10.5)

## 2015-08-08 LAB — BASIC METABOLIC PANEL
ANION GAP: 9 (ref 5–15)
BUN: 11 mg/dL (ref 6–20)
CHLORIDE: 103 mmol/L (ref 101–111)
CO2: 27 mmol/L (ref 22–32)
CREATININE: 1.26 mg/dL — AB (ref 0.61–1.24)
Calcium: 9 mg/dL (ref 8.9–10.3)
GFR calc non Af Amer: 51 mL/min — ABNORMAL LOW (ref >60)
GFR, EST AFRICAN AMERICAN: 59 mL/min — AB (ref >60)
Glucose, Bld: 132 mg/dL — ABNORMAL HIGH (ref 65–99)
POTASSIUM: 3.6 mmol/L (ref 3.5–5.1)
SODIUM: 139 mmol/L (ref 135–145)

## 2015-08-08 LAB — POCT I-STAT 3, VENOUS BLOOD GAS (G3P V)
Acid-Base Excess: 3 mmol/L — ABNORMAL HIGH (ref 0.0–2.0)
BICARBONATE: 27.5 meq/L — AB (ref 20.0–24.0)
O2 Saturation: 68 %
PCO2 VEN: 41.8 mmHg — AB (ref 45.0–50.0)
PO2 VEN: 35 mmHg (ref 30.0–45.0)
TCO2: 29 mmol/L (ref 0–100)
pH, Ven: 7.426 — ABNORMAL HIGH (ref 7.250–7.300)

## 2015-08-08 LAB — GLUCOSE, CAPILLARY
GLUCOSE-CAPILLARY: 115 mg/dL — AB (ref 65–99)
Glucose-Capillary: 134 mg/dL — ABNORMAL HIGH (ref 65–99)

## 2015-08-08 LAB — POCT I-STAT 3, ART BLOOD GAS (G3+)
ACID-BASE EXCESS: 6 mmol/L — AB (ref 0.0–2.0)
Bicarbonate: 30 mEq/L — ABNORMAL HIGH (ref 20.0–24.0)
O2 Saturation: 93 %
PCO2 ART: 38.3 mmHg (ref 35.0–45.0)
PH ART: 7.502 — AB (ref 7.350–7.450)
PO2 ART: 60 mmHg — AB (ref 80.0–100.0)
TCO2: 31 mmol/L (ref 0–100)

## 2015-08-08 LAB — PROTIME-INR
INR: 1.16 (ref 0.00–1.49)
PROTHROMBIN TIME: 14.9 s (ref 11.6–15.2)

## 2015-08-08 SURGERY — RIGHT/LEFT HEART CATH AND CORONARY ANGIOGRAPHY

## 2015-08-08 MED ORDER — SODIUM CHLORIDE 0.9 % WEIGHT BASED INFUSION: 3.0000 mL/kg/h | INTRAVENOUS | Status: AC

## 2015-08-08 MED ORDER — SODIUM CHLORIDE 0.9 % IV SOLN: 250.0000 mL | INTRAVENOUS | Status: DC | PRN

## 2015-08-08 MED ORDER — SODIUM CHLORIDE 0.9 % IJ SOLN: 3.0000 mL | Freq: Two times a day (BID) | INTRAMUSCULAR | Status: DC

## 2015-08-08 MED ORDER — SODIUM CHLORIDE 0.9 % IV SOLN: INTRAVENOUS | Status: AC

## 2015-08-08 MED ORDER — SODIUM CHLORIDE 0.9 % WEIGHT BASED INFUSION: 1.0000 mL/kg/h | INTRAVENOUS | Status: DC

## 2015-08-08 MED ORDER — BACITRACIN-NEOMYCIN-POLYMYXIN OINTMENT TUBE
TOPICAL_OINTMENT | Freq: Once | CUTANEOUS | Status: DC
  Filled 2015-08-08: qty 15

## 2015-08-08 MED ORDER — ONDANSETRON HCL 4 MG/2ML IJ SOLN: 4.0000 mg | Freq: Four times a day (QID) | INTRAMUSCULAR | Status: DC | PRN

## 2015-08-08 MED ORDER — ASPIRIN 81 MG PO CHEW: 81.0000 mg | CHEWABLE_TABLET | ORAL | Status: DC

## 2015-08-08 MED ORDER — LIDOCAINE HCL (PF) 1 % IJ SOLN
INTRAMUSCULAR | Status: DC | PRN
  Administered 2015-08-08: 11:00:00

## 2015-08-08 MED ORDER — LIDOCAINE HCL (PF) 1 % IJ SOLN
INTRAMUSCULAR | Status: AC
  Filled 2015-08-08: qty 30

## 2015-08-08 MED ORDER — MIDAZOLAM HCL 2 MG/2ML IJ SOLN
INTRAMUSCULAR | Status: DC | PRN
  Administered 2015-08-08: 1 mg via INTRAVENOUS

## 2015-08-08 MED ORDER — SODIUM CHLORIDE 0.9 % IJ SOLN: 3.0000 mL | INTRAMUSCULAR | Status: DC | PRN

## 2015-08-08 MED ORDER — BACITRACIN-NEOMYCIN-POLYMYXIN 400-5-5000 EX OINT
TOPICAL_OINTMENT | Freq: Once | CUTANEOUS | Status: DC
  Filled 2015-08-08: qty 1

## 2015-08-08 MED ORDER — ACETAMINOPHEN 325 MG PO TABS: 650.0000 mg | ORAL_TABLET | ORAL | Status: DC | PRN

## 2015-08-08 MED ORDER — FENTANYL CITRATE (PF) 100 MCG/2ML IJ SOLN
INTRAMUSCULAR | Status: DC | PRN
  Administered 2015-08-08: 25 ug via INTRAVENOUS

## 2015-08-08 MED ORDER — FENTANYL CITRATE (PF) 100 MCG/2ML IJ SOLN
INTRAMUSCULAR | Status: AC
  Filled 2015-08-08: qty 2

## 2015-08-08 MED ORDER — HEPARIN (PORCINE) IN NACL 2-0.9 UNIT/ML-% IJ SOLN
INTRAMUSCULAR | Status: AC
  Filled 2015-08-08: qty 1000

## 2015-08-08 MED ORDER — IOHEXOL 350 MG/ML SOLN
INTRAVENOUS | Status: DC | PRN
  Administered 2015-08-08: 35 mL via INTRA_ARTERIAL

## 2015-08-08 MED ORDER — MIDAZOLAM HCL 2 MG/2ML IJ SOLN
INTRAMUSCULAR | Status: AC
  Filled 2015-08-08: qty 2

## 2015-08-08 SURGICAL SUPPLY — 9 items
CATH INFINITI MULTIPACK ANG 4F (CATHETERS) ×3 IMPLANT
CATH SWAN GANZ 7F STRAIGHT (CATHETERS) ×3 IMPLANT
KIT HEART LEFT (KITS) ×3 IMPLANT
KIT HEART RIGHT NAMIC (KITS) ×3 IMPLANT
PACK CARDIAC CATHETERIZATION (CUSTOM PROCEDURE TRAY) ×3 IMPLANT
SHEATH PINNACLE 4F 10CM (SHEATH) ×3 IMPLANT
SHEATH PINNACLE 7F 10CM (SHEATH) ×3 IMPLANT
TRANSDUCER W/STOPCOCK (MISCELLANEOUS) ×6 IMPLANT
WIRE EMERALD 3MM-J .035X150CM (WIRE) ×3 IMPLANT

## 2015-08-08 NOTE — Discharge Instructions (Signed)

## 2015-08-08 NOTE — Progress Notes (Signed)
Left groin cleaned with sterile saline and neosporin ointment applied and dressed with telfa non-stick dressing and tegaderm

## 2015-08-08 NOTE — Progress Notes (Signed)
Site area: left groin  A 4 french arterial and 7 french venous sheath was removed  Site Prior to Removal:  Level 0  Pressure Applied For 15 MINUTES    Minutes Beginning at  1145a  Manual:   Yes.    Patient Status During Pull:  stable  Post Pull Groin Site:  Level 0  Post Pull Instructions Given:  Yes.    Post Pull Pulses Present:  Yes.    Dressing Applied:  Yes.    Comments:  VS remain stable during sheath pull

## 2015-08-08 NOTE — Progress Notes (Signed)
Paged by nursing staff to assess skin tear in L groin. Does have some oozing from L femoral cath site, but also noted thin line of superficial skin tear at the junction of abdomen and groin. His pendulous abdomen blocked the view, had to push it back. Likely related to skin stretch, however degree of skin tear only mild. Cleaned with wet gauze, the area does not appears to be having significant acitive bleeding. Advised cover with triple antibiotics and cover with dressing and allow to heal.   Advised no shower for 48 hours and no bath for 1 week, if it continue to bleed a little, will need to push back no shower time until it completely heal. Keep area clean and dry. Discussed symptom of infection and asked patient to keep an eye on it and contact us if has any sign of infection.  Fred Corrigan PA Pager: [phone]

## 2015-08-08 NOTE — H&P (View-Only) (Signed)
[name]             [phone]     CARDIOTHORACIC SURGERY CONSULTATION REPORT  Referring Provider is [name], MD PCP is [name], MD  Chief Complaint  Patient presents with  . Mitral Regurgitation    Surgical eval, TEE 06/22/15, Cardiac Cath 11/16/14    HPI:  Patient is an 82 year old male with long-standing history of mitral valve prolapse with mitral regurgitation and chronic diastolic congestive heart failure, coronary artery disease status post PCI and stenting of the LAD and RCA using drug-eluting stents, recurrent SVT, pulmonary hypertension, type 2 diabetes mellitus, hyperlipidemia, and chronic back pain who has been referred for surgical consultation to discuss treatment options for management of severe symptomatic primary mitral regurgitation.  The patient states that he has been told that he had a heart murmur for nearly all of his life.  For at least the past 10 years the patient has also had intermittent history of palpitations tCanal to care with his heart ratehat were eventually diagnosed as paroxysmal SVT.  He has been followed by Dr. Percival Spanish since 2014 with primary complaints of exertional fatigue and lower extremity edema.  Transthoracic echocardiogram performed April 2014 was reported to demonstrate the presence of mild mitral regurgitation with normal left ventricular systolic function.  The patient was admitted to the hospital in April 2016 with acute exacerbation of chronic diastolic congestive heart failure with primary complaint of worsening lower extremity edema despite escalating doses of diuretic therapy. Transthoracic echocardiogram performed at that time revealed the presence of a mobile mass on the each relief of the mitral valve that was felt to be li, coronary lobekely chronic flail segment with at least moderate mitral regurgitation. Transesophageal echocardiogram was recommended.  Troponin  levels were slightly elevated but remained flat during that admission. Nuclear stress test was felt to be intermediate risk. The patient underwent diagnostic cardiac catheterization by Dr. Claiborne Billings. He was found to have severe two-vessel coronary artery disease and treated with multivessel PCI and stenting using drug eluding stents in both the LAD and RCA territories. Right heart catheterization was not performed.  Transesophageal echocardiogram was not performed. The patient was not referred for surgical consultation at that time.  The patient states that he has not felt any better since he underwent PCI and stenting.  He continues to complain primarily of increasing fatigue with increasing bilateral lower extremity edema. He also reports some exertional shortness of breath. He denies any history of resting shortness of breath, PND, orthopnea, or lower extremity edema. He was seen in follow-up with Dr. Percival Spanish in late October and subsequent transthoracic echocardiogram revealed normal left ventricular systolic function with likely flail segment of the anterior leaflet of the mitral valve and severe mitral regurgitation.  Shortly after the echocardiogram was performed the patient was hospitalized briefly with an episode of persistent SVT that failed adenosine and required DC cardioversion.  The patient was referred to electrophysiology and seen in consultation by Dr. Curt Bears.  He subsequently underwent transesophageal echocardiogram by Dr. Oval Linsey on 06/22/2015.  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 There was normal left ventricular systolic function with ejection fraction estimated 60-65%. There was no sign of thrombus in the left atrial cavity or left atrial appendage. There was normal right ventricular size and systolic function.  There was mild tricuspid regurgitation. The patient was referred  for surgical consultation.  The patient is married and lives with his wife in [place].  He has been disabled for more than 20 years secondary to chronic pain in his back. He has remained fairly active physically and functionally independent. He states that his back does not other him too much at this time but that he is limited primarily by chronic exertional fatigue. He describes dyspnea with moderately strenuous activity. He denies any resting shortness of breath, PND, orthopnea, dizzy spells, or syncope. He's never had any chest pain or chest tightness with exertion. He has moderate to severe chronic bilateral lower extremity edema. He has an intermittent dry nonproductive cough.    Past Medical History  Diagnosis Date  . Coronary artery disease     a. 11/2014 NSTEMI: DESx 2 placed to LAD and RCA   . Mitral valve prolapse   . Diabetes mellitus, type II ([place])     a. HgA1c 6.8 in 03/2015  . Mitral regurgitation     a. severe wtih flail leaflet by 2D ECHO (06/05/15)  . SVT (supraventricular tachycardia) ([facility])     a. recurrent, usually responsive to vagal manuevers  . HTN (hypertension)   . Pulmonary HTN ([place])   . Lower extremity edema     a. chronic  . HLD (hyperlipidemia)   . Hypertension   . OA (osteoarthritis)   . ED (erectile dysfunction)   . Chronic fatigue   . Gout   . Spinal stenosis   . Prostate cancer ([place])   . Severe mitral regurgitation 11/17/2014    Mitral valve prolapse with flail segment of anterior leaflet   . Chronic diastolic (congestive) heart failure [facility])     Past Surgical History  Procedure Laterality Date  . Cardiac surgery      2 cardiac stents  . Joint replacement      shoulder  . Back surgery x 2    . Rt rotator cuff repair    . Cervical spine surgery    . Back surgery    . Left heart catheterization with coronary angiogram N/A 11/16/2014    Procedure: LEFT HEART CATHETERIZATION WITH CORONARY ANGIOGRAM;  Surgeon: Troy Sine, MD;  Location:  [facility] CATH LAB;  Service: Cardiovascular;  Laterality: N/A;  . Tee without cardioversion N/A 06/22/2015    Procedure: TRANSESOPHAGEAL ECHOCARDIOGRAM (TEE);  Surgeon: Skeet Latch, MD;  Location: [facility] ENDOSCOPY;  Service: Cardiovascular;  Laterality: N/A;    Family History  Problem Relation Age of Onset  . Heart failure Mother 68  . Heart attack Mother   . Heart attack Brother     Social History   Social History  . Marital Status: Married    Spouse Name: N/A  . Number of Children: 4  . Years of Education: N/A   Occupational History  .     Social History Main Topics  . Smoking status: Former Research scientist (life sciences)  . Smokeless tobacco: Never Used     Comment: QUIT SMOKING  MANY YEARS AGO"  . Alcohol Use: No  . Drug Use: No  . Sexual Activity: Not on file   Other Topics Concern  . Not on file   Social History Narrative   ** Merged History Encounter **       Four adopted children.  Lives alone.     Current Outpatient Prescriptions  Medication Sig Dispense Refill  . amLODipine (NORVASC) 5 MG tablet Take 1 tablet (5 mg  total) by mouth daily. 30 tablet 11  . aspirin EC 81 MG tablet Take 81 mg by mouth daily.    . diclofenac sodium (VOLTAREN) 1 % GEL Apply 1 application topically 3 (three) times daily as needed (pain).     Marland Kitchen diltiazem (CARDIZEM CD) 120 MG 24 hr capsule Take 1 capsule (120 mg total) by mouth daily. 30 capsule 6  . finasteride (PROSCAR) 5 MG tablet Take 1 tablet (5 mg total) by mouth daily. 30 tablet 2  . fluticasone (FLONASE) 50 MCG/ACT nasal spray Place 1 spray into both nostrils daily as needed for allergies or rhinitis.    . furosemide (LASIX) 40 MG tablet Take 1.5 tablets (60 mg total) by mouth 2 (two) times daily. 120 tablet 5  . Menthol-Methyl Salicylate (MUSCLE RUB) 10-15 % CREA Apply 1 application topically 2 (two) times daily as needed for muscle pain.     . metoprolol (LOPRESSOR) 50 MG tablet Take 50 mg by mouth 2 (two) times daily.    . potassium chloride SA  (K-DUR,KLOR-CON) 20 MEQ tablet Take 2 tablets (40 mEq total) by mouth daily. 60 tablet 11  . pravastatin (PRAVACHOL) 40 MG tablet Take 1 tablet (40 mg total) by mouth daily at 6 PM. 30 tablet 11  . tamsulosin (FLOMAX) 0.4 MG CAPS capsule Take 0.4 mg by mouth daily.     . ticagrelor (BRILINTA) 90 MG TABS tablet Take 1 tablet (90 mg total) by mouth 2 (two) times daily. 180 tablet 3  . traMADol (ULTRAM) 50 MG tablet TAKE ONE (1) TABLET EVERY 6 HOURS 40 tablet 0  . vitamin B-12 (CYANOCOBALAMIN) 1000 MCG tablet Take 1,000 mcg by mouth daily.     No current facility-administered medications for this visit.    No Known Allergies    Review of Systems:   General:  decreased appetite, decreased energy, no weight gain, no weight loss, no fever  Cardiac:  no chest pain with exertion, no chest pain at rest, + SOB with exertion, no resting SOB, no PND, no orthopnea, no palpitations, + arrhythmia, no atrial fibrillation, + chronic LE edema, no dizzy spells, no syncope  Respiratory:  + exertional shortness of breath, no home oxygen, no productive cough, intermittent dry cough, no bronchitis, no wheezing, no hemoptysis, no asthma, no pain with inspiration or cough, no sleep apnea, no CPAP at night  GI:   mild difficulty swallowing, no reflux, no frequent heartburn, no hiatal hernia, no abdominal pain, + constipation, no diarrhea, no hematochezia, no hematemesis, no melena  GU:   no dysuria,  no frequency, no urinary tract infection, no hematuria, no enlarged prostate, no kidney stones, no kidney disease  Vascular:  no pain suggestive of claudication, no pain in feet, occasional leg cramps, no varicose veins, no DVT, no non-healing foot ulcer  Neuro:   no stroke, no TIA's, no seizures, no headaches, no temporary blindness one eye,  no slurred speech, no peripheral neuropathy, + chronic pain, no instability of gait, no memory/cognitive dysfunction  Musculoskeletal: + arthritis, no joint swelling, no myalgias, no  difficulty walking, normal mobility   Skin:   no rash, no itching, no skin infections, no pressure sores or ulcerations  Psych:   no anxiety, no depression, no nervousness, no unusual recent stress  Eyes:   no blurry vision, no floaters, no recent vision changes, + wears glasses or contacts  ENT:   + hearing loss, no loose or painful teeth, no dentures, last saw dentist several years ago  Hematologic:  + easy bruising, no abnormal bleeding, no clotting disorder, no frequent epistaxis  Endocrine:  + diabetes, does not check CBG's at home     Physical Exam:   BP 129/71 mmHg  Pulse 95  Resp 20  Ht _0  (1.778 m)  Wt 231 lb (104.781 kg)  BMI 33.15 kg/m2  SpO2 97%  General:    well-appearing  HEENT:  Unremarkable   Neck:   no JVD, no bruits, no adenopathy   Chest:   clear to auscultation, symmetrical breath sounds, no wheezes, no rhonchi   CV:   RRR, grade III/VI holosystolic murmur   Abdomen:  soft, non-tender, no masses   Extremities:  warm, well-perfused, pulses diminished, + 3+ bilateral LE edema  Rectal/GU  Deferred  Neuro:   Grossly non-focal and symmetrical throughout  Skin:   Clean and dry, no rashes, no breakdown   Diagnostic Tests:  Transthoracic Echocardiography  Patient:  Claud, Gowan MR #:    [id number] Study Date: 11/16/2014 Gender:   M Age:    36 Height:   175.3 cm Weight:   104.3 kg BSA:    2.29 m^2 Pt. Status: Room:    3E28C  ADMITTING  Jenkins Rouge, M.D. ATTENDING  Jenkins Rouge, M.D. ORDERING   Isaiah Serge REFERRING  Cecilie Kicks R SONOGRAPHER Diamond Nickel PERFORMING  [facility], Inpatient  cc:  ------------------------------------------------------------------- LV EF: 55% -  60%  ------------------------------------------------------------------- Indications:   CHF - 428.0. Mitral regurgitation 424.0.  ------------------------------------------------------------------- History:  PMH: Obesity.  Pulmonary hypertension. Peripheral edema. Metabolic syndrome. Elevated Troponin. Chronic renal disease. Spinal stenosis. Risk factors: Hypertension. Dyslipidemia.  ------------------------------------------------------------------- Study Conclusions  - Left ventricle: The cavity size was normal. Wall thickness was increased in a pattern of severe LVH. Systolic function was normal. The estimated ejection fraction was in the range of 55% to 60%. - Aortic valve: There was mild regurgitation. - Mitral valve: Similar to 2014 there is a clacified mobile mass on anterior leaflet I think this is a chronic flail segment with at least moderate MR Suggest inpateint TEE. There was moderate regurgitation. - Left atrium: The atrium was moderately dilated. - Atrial septum: No defect or patent foramen ovale was identified. - Pulmonary arteries: PA peak pressure: 57 mm Hg (S).  Transthoracic echocardiography. M-mode, complete 2D, spectral Doppler, and color Doppler. Birthdate: Patient birthdate: [birth year]. Age: Patient is 82 yr old. Sex: Gender: male. BMI: 34 kg/m^2. Blood pressure:   128/63 Patient status: Inpatient. Study date: Study date: 11/16/2014. Study time: 09:37 AM. Location: Echo laboratory.  -------------------------------------------------------------------  ------------------------------------------------------------------- Left ventricle: The cavity size was normal. Wall thickness was increased in a pattern of severe LVH. Systolic function was normal. The estimated ejection fraction was in the range of 55% to 60%.  ------------------------------------------------------------------- Aortic valve:  Mildly thickened, moderately calcified leaflets. Doppler: There was mild regurgitation.  ------------------------------------------------------------------- Mitral valve: Similar to 2014 there is a clacified mobile mass on anterior leaflet I think this  is a chronic flail segment with at least moderate MR Suggest inpateint TEE. Doppler: There was moderate regurgitation.  Peak gradient (D): 4 mm Hg.  ------------------------------------------------------------------- Left atrium: The atrium was moderately dilated.  ------------------------------------------------------------------- Atrial septum: No defect or patent foramen ovale was identified.  ------------------------------------------------------------------- Right ventricle: The cavity size was normal. Wall thickness was normal. Systolic function was normal.  ------------------------------------------------------------------- Pulmonic valve:  Structurally normal valve.  Cusp separation was normal. Doppler: Transvalvular velocity was within the normal range. There was mild regurgitation.  ------------------------------------------------------------------- Tricuspid  valve:  Doppler: There was mild regurgitation.  ------------------------------------------------------------------- Right atrium: The atrium was normal in size.  ------------------------------------------------------------------- Pericardium: The pericardium was normal in appearance.  ------------------------------------------------------------------- Measurements  Left ventricle               Value    Reference LV ID, ED, PLAX chordal       (L)   34.5 mm   43 - 52 LV ID, ES, PLAX chordal       (L)   22.6 mm   23 - 38 LV fx shortening, PLAX chordal       34  %   >=29 LV PW thickness, ED             19.2 mm   --------- IVS/LV PW ratio, ED             0.87     <=1.3 LV e&', lateral               6.31 cm/s  --------- LV E/e&', lateral              15.52    --------- LV e&', medial                4.9  cm/s  --------- LV E/e&', medial                19.98    --------- LV e&', average               5.61 cm/s  --------- LV E/e&', average              17.47    ---------  Ventricular septum             Value    Reference IVS thickness, ED              16.7 mm   ---------  LVOT                    Value    Reference LVOT ID, S                 21  mm   --------- LVOT area                  3.46 cm^2  ---------  Aortic valve                Value    Reference Aortic regurg pressure half-time      347  ms   ---------  Aorta                    Value    Reference Aortic root ID, ED             33  mm   ---------  Left atrium                 Value    Reference LA ID, A-P, ES               48  mm   --------- LA ID/bsa, A-P               2.1  cm/m^2 <=2.2 LA volume, S                136  ml   --------- LA volume/bsa, S              59.4 ml/m^2 --------- LA volume, ES, 1-p  A4C           121  ml   --------- LA volume/bsa, ES, 1-p A4C         52.8 ml/m^2 --------- LA volume, ES, 1-p A2C           147  ml   --------- LA volume/bsa, ES, 1-p A2C         64.2 ml/m^2 ---------  Mitral valve                Value    Reference Mitral E-wave peak velocity         97.9 cm/s  --------- Mitral A-wave peak velocity         44.1 cm/s  --------- Mitral deceleration time      (L)   130  ms   150 - 230 Mitral peak gradient, D           4   mm Hg --------- Mitral E/A ratio, peak           2.2     --------- Mitral maximal regurg velocity,       510  cm/s  --------- PISA Mitral regurg VTI, PISA            152  cm   ---------  Pulmonary arteries             Value    Reference PA pressure, S, DP         (H)   57  mm Hg <=30  Tricuspid valve               Value    Reference Tricuspid regurg peak velocity       324  cm/s  --------- Tricuspid peak RV-RA gradient        42  mm Hg ---------  Systemic veins               Value    Reference Estimated CVP                15  mm Hg ---------  Right ventricle               Value    Reference RV pressure, S, DP         (H)   57  mm Hg <=30 RV s&', lateral, S              13.2 cm/s  ---------  Pulmonic valve               Value    Reference Pulmonic regurg velocity, ED        62.4 cm/s  ---------  Legend: (L) and (H) mark values outside specified reference range.  ------------------------------------------------------------------- Prepared and Electronically Authenticated by  Jenkins Rouge, M.D. 2016-04-13T11:40:18     CARDIAC CATHETERIZATION/PERCUTANEOUS CORONARY INTERVENTION   HISTORY:   ZEEK ROSTRON is a 82 y.o. male   PROCEDURE: Left heart catheterization: coronary angiography, left ventriculography, 2 vessel percutaneous coronary intervention with PTCA/DES stenting of the mid LAD and the proximal RCA.  The patient was brought to the [facility] cardiac catherization laboratory in the fasting state. He was premedicated with Versed 2 mg and fentanyl 25 g. His right groin was prepped and shaved in usual sterile fashion. Xylocaine 1% was used for local anesthesia. A 5 French sheath was inserted into the R femoral artery. Diagnostic catheterizatiion was done with 5 [place] FL5, FR4, and pigtail catheters. Left ventriculography was done  with 27 cc Omnipaque contrast. With the demonstration of a 95% focal stenosis in the mid  LAD and a 95% proximal RCA stenosis. The decision was made to proceed with two-vessel percutaneous coronary intervention. The arterial sheath was upgraded to a 6 [place] sheath. Angiomax bolus plus infusion was administered. Brilinta 180 mg was administered orally. Attention was initially directed at the LAD. A 6 French XB LAD 4.0 guide was used. A Prowater wire was advanced down the LAD. Dilatation was done with a 2.512 mm Euphora balloon. A Xience Alpine 3.018 mm DES stent was then deployed for 2 inflations at 14 atm. An [place] Euphora for 3.2512 mm balloon was used for post stent dilatation up to 3.21 mm. Angiography confirmed an excellent angiographic result with the stenosis being reduced to 0%. Attention was then directed at the RCA. A 6 [place] FR4 guide was used for the intervention. The same Prowater and Euphora balloon were used and predilatation was done at 8 and 10 atm. A Xience Alpine 3.015 mm DES stent was deployed at 11 and 12 atm. The same 3.2512 [place] Euphora balloon was used for post stent dilatation at 10 and 11 atm up to 3.18 mm. Angiography confirmed an excellent angiographic result. All catheters were removed and the patient. The arterial sheath was sutured in place. The patient will be maintained on reduced dose bivalirudin for 2 hours post procedure  HEMODYNAMICS:  Central Aorta: 140/63  Left Ventricle: 140/11/24  ANGIOGRAPHY:  Left main: Angiographically normal vessel which bifurcated into the LAD and left circumflex vessel.   LAD: Large caliber vessel that gave rise to a proximal septal and diagonal branch. The LAD after the diagonal vessel had a focal 95% stenosis. The remainder of the LAD was free of significant disease and wrapped around the LV apex.  Left circumflex: Large caliber normal vessel which gave rise to one major obtuse marginal branch.   Right coronary artery: Large caliber dominant vessel that had a focal 95% proximal stenosis after a  moderate sized proximal branch. The vessel ended in the PDA and PLA vessel.   Left ventriculography revealed preserved global LV contractility with an ejection fraction of 55-60%. There appeared to be 2-3+ angiographic mitral regurgitation.  Percutaneous coronary intervention:  The 95% LAD stenosis was reduced to 0% with insertion of a Xience Alpine 3.018 mm DES stent postdilated to 3.21 mm.  The 95% proximal RCA stenosis was reduced to 0% with insertion of a Xience Alpine 3.015 mm DES stent postdilated to 3.18 mm.  IMPRESSION:  Normal LV function with an ejection fraction of 55-60% and evidence for 2-3+ angiographic mitral regurgitation.  Two-vessel coronary obstructive disease with focal 95% mid LAD stenosis and focal proximal 95% RCA stenosis and a large dominant RCA and a normal left circumflex coronary artery.  Successful two-vessel percutaneous coronary intervention to the LAD and RCA with both 95% stenoses being reduced to 0% following insertion of Xience Alpine 3.018 mm DES stent in the LAD and 3.015 mm DES stent in the RCA.  RECOMMENDATION:  The patient will be maintained on dual antiplatelet therapy for minimum of 1 year. He will be hydrated postprocedure. Aggressive statin therapy. Medical therapy for CAD/hypertension.  Troy Sine, MD, [facility] 11/16/2014 7:39 PM    Transthoracic Echocardiography  Patient:  Tarance, Balan MR #:    [id number] Study Date: 06/05/2015 Gender:   M Age:    80 Height:   177.8 cm Weight:   104.8 kg BSA:    2.31  m^2 Pt. Status: Room:    2W10C  ATTENDING  Carmin Muskrat [id number] SONOGRAPHER Donata Clay ADMITTING  Friedman, Trimble  [facility], Inpatient ORDERING   Eileen Stanford REFERRING  Angelena Form R  cc:  ------------------------------------------------------------------- LV EF: 75% -   80%  ------------------------------------------------------------------- Indications:   CAD of native vessels 414.01.  ------------------------------------------------------------------- History:  PMH: SVT Lactic acidosis. Moderate MR with a possible mass. PMH:  Myocardial infarction.  ------------------------------------------------------------------- Study Conclusions  - Left ventricle: The cavity size was normal. There was moderate concentric hypertrophy. Systolic function was hyperdynamic. The estimated ejection fraction was in the range of 75% to 80%. Wall motion was normal; there were no regional wall motion abnormalities. Features are consistent with a pseudonormal left ventricular filling pattern, with concomitant abnormal relaxation and increased filling pressure (grade 2 diastolic dysfunction). - Aortic valve: There was mild regurgitation. - Mitral valve: Calcified annulus. Mildly thickened leaflets . Flail motion involving a segment of the anterior leaflet. This causes the appearance of a &quot;mass&quot; on the anterior leaflet. There was severe regurgitation directed eccentrically and posteriorly. Severe regurgitation is suggested by pulmonary vein systolic flow reversal. - Left atrium: The atrium was severely dilated. - Right ventricle: The cavity size was mildly dilated. Wall thickness was normal. - Right atrium: The atrium was moderately to severely dilated. - Tricuspid valve: There was moderate regurgitation. - Pulmonic valve: There was moderate regurgitation. - Pulmonary arteries: Systolic pressure was moderately increased. PA peak pressure: 46 mm Hg (S).  Transthoracic echocardiography. M-mode, complete 2D, spectral Doppler, and color Doppler. Birthdate: Patient birthdate: 1929/05/22. Age: Patient is 82 yr old. Sex: Gender: male. BMI: 33.1 kg/m^2. Blood pressure:   125/71 Patient status: Inpatient. Study date:  Study date: 06/05/2015. Study time: 01:12 PM. Location: Bedside.  -------------------------------------------------------------------  ------------------------------------------------------------------- Left ventricle: The cavity size was normal. There was moderate concentric hypertrophy. Systolic function was hyperdynamic. The estimated ejection fraction was in the range of 75% to 80%. Wall motion was normal; there were no regional wall motion abnormalities. Features are consistent with a pseudonormal left ventricular filling pattern, with concomitant abnormal relaxation and increased filling pressure (grade 2 diastolic dysfunction).  ------------------------------------------------------------------- Aortic valve:  Trileaflet; mildly thickened, mildly calcified leaflets. Sclerosis without stenosis. Doppler: There was mild regurgitation.  ------------------------------------------------------------------- Mitral valve:  Calcified annulus. Mildly thickened leaflets . Flail motion involving a segment of the anterior leaflet. This causes the appearance of a &quot;mass&quot; on the anterior leaflet. Doppler: There was severe regurgitation directed eccentrically and posteriorly. Severe regurgitation is suggested by pulmonary vein systolic flow reversal.  Peak gradient (D): 3 mm Hg.  ------------------------------------------------------------------- Left atrium: The atrium was severely dilated.  ------------------------------------------------------------------- Right ventricle: The cavity size was mildly dilated. Wall thickness was normal. Systolic function was normal.  ------------------------------------------------------------------- Pulmonic valve:  Poorly visualized. Doppler: There was moderate regurgitation.  ------------------------------------------------------------------- Tricuspid valve:  Structurally normal valve.  Leaflet separation was normal.  Doppler: Transvalvular velocity was within the normal range. There was moderate regurgitation.  ------------------------------------------------------------------- Pulmonary artery:  Systolic pressure was moderately increased.  ------------------------------------------------------------------- Right atrium: The atrium was moderately to severely dilated.  ------------------------------------------------------------------- Pericardium: There was no pericardial effusion.  ------------------------------------------------------------------- Measurements  Left ventricle              Value    Reference LV ID, ED, PLAX chordal     (L)   35.9 mm   43 - 52 LV ID, ES, PLAX chordal         25.6 mm   23 -  38 LV fx shortening, PLAX chordal      29  %   >=29 LV PW thickness, ED           17.7 mm   --------- IVS/LV PW ratio, ED           1.04     <=1.3 LV ejection fraction, 1-p A4C      62  %   --------- LV end-diastolic volume, 2-p       184  ml   --------- LV end-systolic volume, 2-p       71  ml   --------- LV ejection fraction, 2-p        62  %   --------- Stroke volume, 2-p            114  ml   --------- LV end-diastolic volume/bsa, 2-p     80  ml/m^2 --------- LV end-systolic volume/bsa, 2-p     31  ml/m^2 --------- Stroke volume/bsa, 2-p          49.4 ml/m^2 --------- LV e&', lateral              4.88 cm/s  --------- LV E/e&', lateral             18.59    --------- LV e&', medial              3.57 cm/s  --------- LV E/e&', medial             25.41    --------- LV e&', average              4.23 cm/s  --------- LV E/e&', average             21.47    ---------  Ventricular septum            Value     Reference IVS thickness, ED            18.4 mm   ---------  LVOT                   Value    Reference LVOT ID, S                20  mm   --------- LVOT area                3.14 cm^2  ---------  Aorta                  Value    Reference Aortic root ID, ED            30  mm   ---------  Left atrium               Value    Reference LA ID, A-P, ES              52  mm   --------- LA ID/bsa, A-P          (H)   2.25 cm/m^2 <=2.2 LA volume, S               182  ml   --------- LA volume/bsa, S             78.8 ml/m^2 --------- LA volume, ES, 1-p A4C          125  ml   --------- LA volume/bsa, ES, 1-p A4C        54.1 ml/m^2 --------- LA volume, ES, 1-p A2C  218  ml   --------- LA volume/bsa, ES, 1-p A2C        94.4 ml/m^2 ---------  Mitral valve               Value    Reference Mitral E-wave peak velocity       90.7 cm/s  --------- Mitral A-wave peak velocity       44.6 cm/s  --------- Mitral deceleration time     (H)   239  ms   150 - 230 Mitral peak gradient, D         3   mm Hg --------- Mitral E/A ratio, peak          2      ---------  Pulmonary arteries            Value    Reference PA pressure, S, DP        (H)   46  mm Hg <=30  Tricuspid valve             Value    Reference Tricuspid regurg peak velocity      329  cm/s  --------- Tricuspid peak RV-RA gradient      43  mm Hg ---------  Systemic veins              Value    Reference Estimated CVP              3   mm Hg ---------  Right ventricle             Value    Reference TAPSE                   31.7 mm   --------- RV pressure, S, DP        (H)   46  mm Hg <=30 RV s&', lateral, S            11.6 cm/s  ---------  Legend: (L) and (H) mark values outside specified reference range.  ------------------------------------------------------------------- Prepared and Electronically Authenticated by  Sanda Klein, MD 2016-10-31T14:53:56   Transesophageal Echocardiography  Patient:  Taro, Hidrogo MR #:    [id number] Study Date: 06/22/2015 Gender:   M Age:    71 Height:   175.3 cm Weight:   106.8 kg BSA:    2.32 m^2 Pt. Status: Room:  SONOGRAPHER Joanie Coddington, [place], MD [place], MD [place], MD PERFORMING  Skeet Latch, MD [place], MD  cc:  ------------------------------------------------------------------- LV EF: 60% -  65%  ------------------------------------------------------------------- Indications:   Mitral regurgitation 424.0.  ------------------------------------------------------------------- Study Conclusions  - Left ventricle: Systolic function was normal. The estimated ejection fraction was in the range of 60% to 65%. Wall motion was normal; there were no regional wall motion abnormalities. - Aortic valve: There was mild regurgitation. 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 There was severe regurgitation directed posteriorly. - Left atrium: No evidence of thrombus in the atrial cavity or appendage. No evidence of thrombus in the atrial cavity or appendage. - Right atrium: No evidence of thrombus in the atrial cavity or appendage. - Atrial septum: The septum bowed from left to right, consistent with increased left atrial pressure.  Diagnostic transesophageal echocardiography. 2D and color  Doppler. Birthdate: Patient birthdate: 1929-02-08. Age: Patient is 82 yr old. Sex: Gender: male.  BMI: 34.8 kg/m^2. Blood pressure: 154/73 Patient status:  Outpatient. Study date: Study date: 06/22/2015. Study time: 10:51 AM. Location: Endoscopy.  -------------------------------------------------------------------  ------------------------------------------------------------------- Left ventricle: Systolic function was normal. The estimated ejection fraction was in the range of 60% to 65%. Wall motion was normal; there were no regional wall motion abnormalities.  ------------------------------------------------------------------- Aortic valve:  Trileaflet; normal thickness, mildly calcified leaflets. Cusp separation was normal. Doppler: There was mild regurgitation.  ------------------------------------------------------------------- Aorta: There was no atheroma. There was no evidence for dissection. Aortic root: The aortic root was not dilated. Ascending aorta: The ascending aorta was normal in size. Aortic arch: The aortic arch was normal in size. Descending aorta: The descending aorta was normal in size.  ------------------------------------------------------------------- Mitral valve: Leaflet separation was normal. Calcified, ruptured cord with flail motion of the A3 segment. Doppler: There was severe regurgitation directed posteriorly.  ------------------------------------------------------------------- Left atrium: The atrium was normal in size. No evidence of thrombus in the atrial cavity or appendage. No evidence of thrombus in the atrial cavity or appendage. The appendage was morphologically a left appendage, multilobulated, and of normal size. Emptying velocity was normal.  ------------------------------------------------------------------- Atrial septum: The septum bowed from left to right, consistent with increased left atrial  pressure.  ------------------------------------------------------------------- Pulmonary veins: Left upper pulmonary vein: There was systolic flow reversal.  ------------------------------------------------------------------- Right ventricle: The cavity size was normal. Wall thickness was normal. Systolic function was normal.  ------------------------------------------------------------------- Pulmonic valve:  Structurally normal valve.  Doppler: There was trivial regurgitation.  ------------------------------------------------------------------- Tricuspid valve:  Structurally normal valve.  Leaflet separation was normal. Doppler: There was mild regurgitation.  ------------------------------------------------------------------- Pulmonary artery:  The main pulmonary artery was normal-sized.  ------------------------------------------------------------------- Right atrium: The atrium was normal in size. No evidence of thrombus in the atrial cavity or appendage. The appendage was morphologically a right appendage.  ------------------------------------------------------------------- Pericardium: There was no pericardial effusion.  ------------------------------------------------------------------- Measurements  Mitral valve                Value Mitral maximal regurg velocity, PISA    590  cm/s Mitral regurg VTI, PISA          152  cm Mitral ERO, PISA              1.44 cm^2 Mitral regurg volume, PISA         219  ml  Tricuspid valve              Value Tricuspid regurg peak velocity       202  cm/s Tricuspid peak RV-RA gradient       16  mm Hg  Legend: (L) and (H) mark values outside specified reference range.  ------------------------------------------------------------------- Prepared and Electronically Authenticated by  Skeet Latch, MD 2016-11-17T17:40:04        Impression:  Patient has stage D severe symptomatic primary mitral regurgitation. He presents with chronic symptoms of exertional fatigue, shortness of breath, and bilateral lower extremity edema consistent with chronic diastolic congestive heart failure, New York Heart Association functional class II. He was hospitalized last spring with an acute exacerbation of chronic diastolic congestive heart failure and was treated with PCI and stenting for multivessel coronary artery disease discovered at the time of diagnostic cardiac catheterization. The patient also suffers from a long-standing history of recurrent SVT that recently required brief hospitalization for DC cardioversion.  I have personally reviewed the patient's recent transesophageal echocardiogram and several previous transthoracic echocardiograms. He has an obvious flail segment of the anterior leaflet of the mitral valve with type II dysfunction and severe mitral regurgitation. Left ventricular  systolic function remains preserved. I agree the patient would best be treated with elective mitral valve repair. Risks of surgery may be somewhat increased because of the patient's advanced age, numerous comorbid medical conditions, and need for long-term dual antiplatelet therapy.  Given his history of SVT he will clearly be at high risk for the development of recurrent atrial dysrhythmias following surgery. He benefit from concomitant maze procedure.   Plan:  The patient and his wife were counseled at length regarding the indications, risks and potential benefits of mitral valve repair.  The rationale for elective surgery has been explained, including a comparison between surgery and continued medical therapy with close follow-up.   The patient is interested in proceeding with elective surgery at some point in the near future, but he desires to wait until after the holidays of past. He will need to undergo left and right heart catheterization  prior to surgery. We will obtain CT angiogram of the aorta and iliac vessels to investigate whether or not the patient will have adequate pelvic vascular access for femoral cannulation to facilitate possible minimally invasive approach for surgery.  The patient will return for follow-up once these tests have been completed and potentially make final plans for surgery at that time. All of his questions have been addressed.   I spent in excess of 90 minutes during the conduct of this office consultation and >50% of this time involved direct face-to-face encounter with the patient for counseling and/or coordination of their care.    Valentina Gu. Roxy Manns, MD 07/13/2015 2:35 PM

## 2015-08-08 NOTE — Interval H&P Note (Signed)
History and Physical Interval Note:  08/08/2015 10:30 AM  [name]  has presented today for surgery, with the diagnosis of mr/preop  The various methods of treatment have been discussed with the patient and family. After consideration of risks, benefits and other options for treatment, the patient has consented to  Procedure(s): Right/Left Heart Cath and Coronary Angiography (N/A) as a surgical intervention .  The patient's history has been reviewed, patient examined, no change in status, stable for surgery.  I have reviewed the patient's chart and labs.  Questions were answered to the patient's satisfaction.     Sherren Mocha

## 2015-08-10 DIAGNOSIS — Z961 Presence of intraocular lens: Secondary | ICD-10-CM | POA: Diagnosis not present

## 2015-08-14 ENCOUNTER — Ambulatory Visit (INDEPENDENT_AMBULATORY_CARE_PROVIDER_SITE_OTHER): Payer: Medicare Other | Admitting: Family Medicine

## 2015-08-14 ENCOUNTER — Encounter: Payer: Self-pay | Admitting: Family Medicine

## 2015-08-14 VITALS — BP 120/60 | HR 72 | Temp 97.6°F | Resp 18 | Ht 67.0 in | Wt 227.0 lb

## 2015-08-14 DIAGNOSIS — G5603 Carpal tunnel syndrome, bilateral upper limbs: Secondary | ICD-10-CM | POA: Diagnosis not present

## 2015-08-14 NOTE — Progress Notes (Signed)
Subjective:    Patient ID: [name], male    DOB: 07/26/1929, 82 y.o.   MRN: [id number]  HPI Patient has congestive heart failure. He also has severe mitral regurgitation. He is considering operative repair of his mitral valve. Obviously this makes him a high risk candidate for any elective surgery. He presents with several months of numbness in both hands right greater than left. The numbness and tingling is present distal to the wrist. It involves every finger except the fifth digit on either hand. Today he is unable to feel vibration in any finger except the fifth digit. He also has diminished sensation to 2. Discrimination in every finger September 5 digit. He has decreased grip strength bilaterally. Past Medical History  Diagnosis Date  . Coronary artery disease     a. 11/2014 NSTEMI: DESx 2 placed to LAD and RCA   . Mitral valve prolapse   . Diabetes mellitus, type II ([place])     a. HgA1c 6.8 in 03/2015  . Mitral regurgitation     a. severe wtih flail leaflet by 2D ECHO (06/05/15)  . SVT (supraventricular tachycardia) ([facility])     a. recurrent, usually responsive to vagal manuevers  . HTN (hypertension)   . Pulmonary HTN ([place])   . Lower extremity edema     a. chronic  . HLD (hyperlipidemia)   . Hypertension   . OA (osteoarthritis)   . ED (erectile dysfunction)   . Chronic fatigue   . Gout   . Spinal stenosis   . Prostate cancer ([place])   . Severe mitral regurgitation 11/17/2014    Mitral valve prolapse with flail segment of anterior leaflet   . Chronic diastolic (congestive) heart failure ([place])   . Hemangioma of liver 08/02/2015    12 mm enhancing lesion seen on CT - suspicious for benign hemangioma but not diagnostic - MRI recommended   Past Surgical History  Procedure Laterality Date  . Cardiac surgery      2 cardiac stents  . Joint replacement      shoulder  . Back surgery x 2    . Rt rotator cuff repair    . Cervical spine surgery    . Back surgery    . Left  heart catheterization with coronary angiogram N/A 11/16/2014    Procedure: LEFT HEART CATHETERIZATION WITH CORONARY ANGIOGRAM;  Surgeon: Troy Sine, MD;  Location: [facility] C[facility] LAB;  Service: Cardiovascular;  Laterality: N/A;  . Tee without cardioversion N/A 06/22/2015    Procedure: TRANSESOPHAGEAL ECHOCARDIOGRAM (TEE);  Surgeon: Skeet Latch, MD;  Location: [place];  Service: Cardiovascular;  Laterality: N/A;  . Cardiac catheterization N/A 08/08/2015    Procedure: Right/Left Heart Cath and Coronary Angiography;  Surgeon: Sherren Mocha, MD;  Location: [place] CV LAB;  Service: Cardiovascular;  Laterality: N/A;   Current Outpatient Prescriptions on File Prior to Visit  Medication Sig Dispense Refill  . amLODipine (NORVASC) 5 MG tablet Take 1 tablet (5 mg total) by mouth daily. 30 tablet 11  . aspirin EC 81 MG tablet Take 81 mg by mouth daily.    Marland Kitchen diltiazem (CARDIZEM CD) 120 MG 24 hr capsule Take 1 capsule (120 mg total) by mouth daily. 30 capsule 6  . finasteride (PROSCAR) 5 MG tablet Take 1 tablet (5 mg total) by mouth daily. 30 tablet 2  . fluticasone (FLONASE) 50 MCG/ACT nasal spray Place 1 spray into both nostrils daily as needed for allergies or rhinitis.    . furosemide (  LASIX) 40 MG tablet Take 1.5 tablets (60 mg total) by mouth 2 (two) times daily. (Patient taking differently: Take 40-60 mg by mouth 2 (two) times daily. 40 mg in the morning and 60 mg at night) 120 tablet 5  . GARLIC PO Take 1 tablet by mouth daily.    . Menthol-Methyl Salicylate (MUSCLE RUB) 10-15 % CREA Apply 1 application topically 2 (two) times daily as needed for muscle pain.     . potassium chloride SA (K-DUR,KLOR-CON) 20 MEQ tablet Take 2 tablets (40 mEq total) by mouth daily. 60 tablet 11  . pravastatin (PRAVACHOL) 40 MG tablet Take 1 tablet (40 mg total) by mouth daily at 6 PM. (Patient taking differently: Take 40 mg by mouth daily. ) 30 tablet 11  . tamsulosin (FLOMAX) 0.4 MG CAPS capsule Take 0.4 mg by  mouth daily.     . ticagrelor (BRILINTA) 90 MG TABS tablet Take 1 tablet (90 mg total) by mouth 2 (two) times daily. 180 tablet 3  . traMADol (ULTRAM) 50 MG tablet TAKE ONE (1) TABLET EVERY 6 HOURS 40 tablet 0   No current facility-administered medications on file prior to visit.   No Known Allergies Social History   Social History  . Marital Status: Married    Spouse Name: N/A  . Number of Children: 4  . Years of Education: N/A   Occupational History  .     Social History Main Topics  . Smoking status: Former Research scientist (life sciences)  . Smokeless tobacco: Never Used     Comment: QUIT SMOKING  MANY YEARS AGO"  . Alcohol Use: No  . Drug Use: No  . Sexual Activity: Not on file   Other Topics Concern  . Not on file   Social History Narrative   ** Merged History Encounter **       Four adopted children.  Lives alone.       Review of Systems  All other systems reviewed and are negative.      Objective:   Physical Exam  Cardiovascular: Normal rate.   Murmur heard. Pulmonary/Chest: Effort normal and breath sounds normal.  Vitals reviewed.  Decreased grip strength in both hands right greater than left. Decreased 2 point discrimination in the right hand than the left hand. Decreased sensation to vibration in all digits excluding the fifth digit bilaterally. Positive Tinel sign. Positive Phalen sign.       Assessment & Plan:  Bilateral carpal tunnel syndrome  Patient is a very high surgical risk candidate. Therefore will try conservative therapy. Using sterile technique, I injected the right carpal tunnel using a mixture of 1 mL of lidocaine without epinephrine and 1 mL of 40 mg per mL Kenalog. Patient tolerated the procedure well. The injection was performed medial to the palmaris longus tendon at the proximal flexor crease using sterile technique.  If symptoms do not improve, we can consult a hand surgeon

## 2015-08-21 ENCOUNTER — Encounter: Payer: Medicare Other | Admitting: Thoracic Surgery (Cardiothoracic Vascular Surgery)

## 2015-08-25 ENCOUNTER — Ambulatory Visit (INDEPENDENT_AMBULATORY_CARE_PROVIDER_SITE_OTHER): Payer: Medicare Other | Admitting: Family Medicine

## 2015-08-25 ENCOUNTER — Encounter: Payer: Self-pay | Admitting: Family Medicine

## 2015-08-25 VITALS — BP 126/68 | HR 80 | Temp 97.8°F | Resp 20 | Ht 67.0 in | Wt 228.0 lb

## 2015-08-25 DIAGNOSIS — G5603 Carpal tunnel syndrome, bilateral upper limbs: Secondary | ICD-10-CM

## 2015-08-25 NOTE — Progress Notes (Signed)
Subjective:    Patient ID: [name], male    DOB: October 16, 1928, 82 y.o.   MRN: [id number]  HPI 08/14/15 Patient has congestive heart failure. He also has severe mitral regurgitation. He is considering operative repair of his mitral valve. Obviously this makes him a high risk candidate for any elective surgery. He presents with several months of numbness in both hands right greater than left. The numbness and tingling is present distal to the wrist. It involves every finger except the fifth digit on either hand. Today he is unable to feel vibration in any finger except the fifth digit. He also has diminished sensation to 2. Discrimination in every finger except the 5th digit. He has decreased grip strength bilaterally.  At that time, my plan was: Patient is a very high surgical risk candidate. Therefore will try conservative therapy. Using sterile technique, I injected the right carpal tunnel using a mixture of 1 mL of lidocaine without epinephrine and 1 mL of 40 mg per mL Kenalog. Patient tolerated the procedure well. The injection was performed medial to the palmaris longus tendon at the proximal flexor crease using sterile technique.  If symptoms do not improve, we can consult a hand surgeon  08/25/15  the patient's SIGNIFICANT benefit from a cortisone injection he had in the right wrist. He is asking me to repeat the injection in his left wrist to treat the carpal tunnel syndrome symptoms he is experiencing as well as including numbness tingling weakness in the hands and pain. Past Medical History  Diagnosis Date  . Coronary artery disease     a. 11/2014 NSTEMI: DESx 2 placed to LAD and RCA   . Mitral valve prolapse   . Diabetes mellitus, type II ([facility])     a. HgA1c 6.8 in 03/2015  . Mitral regurgitation     a. severe wtih flail leaflet by 2D ECHO (06/05/15)  . SVT (supraventricular tachycardia) ([facility])     a. recurrent, usually responsive to vagal manuevers  . HTN (hypertension)   .  Pulmonary HTN ([place])   . Lower extremity edema     a. chronic  . HLD (hyperlipidemia)   . Hypertension   . OA (osteoarthritis)   . ED (erectile dysfunction)   . Chronic fatigue   . Gout   . Spinal stenosis   . Prostate cancer ([facility])   . Severe mitral regurgitation 11/17/2014    Mitral valve prolapse with flail segment of anterior leaflet   . Chronic diastolic (congestive) heart failure ([facility])   . Hemangioma of liver 08/02/2015    12 mm enhancing lesion seen on CT - suspicious for benign hemangioma but not diagnostic - MRI recommended   Past Surgical History  Procedure Laterality Date  . Cardiac surgery      2 cardiac stents  . Joint replacement      shoulder  . Back surgery x 2    . Rt rotator cuff repair    . Cervical spine surgery    . Back surgery    . Left heart catheterization with coronary angiogram N/A 11/16/2014    Procedure: LEFT HEART CATHETERIZATION WITH CORONARY ANGIOGRAM;  Surgeon: Troy Sine, MD;  Location: [facility] CATH LAB;  Service: Cardiovascular;  Laterality: N/A;  . Tee without cardioversion N/A 06/22/2015    Procedure: TRANSESOPHAGEAL ECHOCARDIOGRAM (TEE);  Surgeon: Skeet Latch, MD;  Location: [facility] ENDOSCOPY;  Service: Cardiovascular;  Laterality: N/A;  . Cardiac catheterization N/A 08/08/2015    Procedure: Right/Left Heart Cath  and Coronary Angiography;  Surgeon: Sherren Mocha, MD;  Location: [facility] CV LAB;  Service: Cardiovascular;  Laterality: N/A;   Current Outpatient Prescriptions on File Prior to Visit  Medication Sig Dispense Refill  . amLODipine (NORVASC) 5 MG tablet Take 1 tablet (5 mg total) by mouth daily. 30 tablet 11  . aspirin EC 81 MG tablet Take 81 mg by mouth daily.    Marland Kitchen diltiazem (CARDIZEM CD) 120 MG 24 hr capsule Take 1 capsule (120 mg total) by mouth daily. 30 capsule 6  . finasteride (PROSCAR) 5 MG tablet Take 1 tablet (5 mg total) by mouth daily. 30 tablet 2  . fluticasone (FLONASE) 50 MCG/ACT nasal spray Place 1 spray into both  nostrils daily as needed for allergies or rhinitis.    . furosemide (LASIX) 40 MG tablet Take 1.5 tablets (60 mg total) by mouth 2 (two) times daily. (Patient taking differently: Take 40-60 mg by mouth 2 (two) times daily. 40 mg in the morning and 60 mg at night) 120 tablet 5  . GARLIC PO Take 1 tablet by mouth daily.    . Menthol-Methyl Salicylate (MUSCLE RUB) 10-15 % CREA Apply 1 application topically 2 (two) times daily as needed for muscle pain.     . potassium chloride SA (K-DUR,KLOR-CON) 20 MEQ tablet Take 2 tablets (40 mEq total) by mouth daily. 60 tablet 11  . pravastatin (PRAVACHOL) 40 MG tablet Take 1 tablet (40 mg total) by mouth daily at 6 PM. (Patient taking differently: Take 40 mg by mouth daily. ) 30 tablet 11  . tamsulosin (FLOMAX) 0.4 MG CAPS capsule Take 0.4 mg by mouth daily.     . ticagrelor (BRILINTA) 90 MG TABS tablet Take 1 tablet (90 mg total) by mouth 2 (two) times daily. 180 tablet 3  . traMADol (ULTRAM) 50 MG tablet TAKE ONE (1) TABLET EVERY 6 HOURS 40 tablet 0   No current facility-administered medications on file prior to visit.   No Known Allergies Social History   Social History  . Marital Status: Married    Spouse Name: N/A  . Number of Children: 4  . Years of Education: N/A   Occupational History  .     Social History Main Topics  . Smoking status: Former Research scientist (life sciences)  . Smokeless tobacco: Never Used     Comment: QUIT SMOKING  MANY YEARS AGO"  . Alcohol Use: No  . Drug Use: No  . Sexual Activity: Not on file   Other Topics Concern  . Not on file   Social History Narrative   ** Merged History Encounter **       Four adopted children.  Lives alone.       Review of Systems  All other systems reviewed and are negative.      Objective:   Physical Exam  Cardiovascular: Normal rate.   Murmur heard. Pulmonary/Chest: Effort normal and breath sounds normal.  Vitals reviewed.  Decreased grip strength in both hands right greater than left.  Decreased 2 point discrimination in the right hand more than the left hand. Decreased sensation to vibration in all digits excluding the fifth digit bilaterally. Positive Tinel sign. Positive Phalen sign.       Assessment & Plan:  Bilateral carpal tunnel syndrome    Using sterile technique, I injected the left wrist at the proximal flexor crease on the ulnar side of the palmaris longus tendon with 1 mL of lidocaine and 1 mL of 40 mg per mL Kenalog. Patient  tolerated procedure well with no complication.

## 2015-09-08 ENCOUNTER — Other Ambulatory Visit: Payer: Self-pay | Admitting: Family Medicine

## 2015-09-08 MED ORDER — TAMSULOSIN HCL 0.4 MG PO CAPS: 0.4000 mg | ORAL_CAPSULE | Freq: Every day | ORAL | Status: DC

## 2015-09-08 NOTE — Telephone Encounter (Signed)
Medication refilled per protocol. 

## 2015-09-11 ENCOUNTER — Ambulatory Visit (INDEPENDENT_AMBULATORY_CARE_PROVIDER_SITE_OTHER): Payer: Medicare Other | Admitting: Thoracic Surgery (Cardiothoracic Vascular Surgery)

## 2015-09-11 ENCOUNTER — Encounter: Payer: Self-pay | Admitting: Thoracic Surgery (Cardiothoracic Vascular Surgery)

## 2015-09-11 VITALS — BP 130/76 | HR 86 | Resp 16 | Ht 67.0 in | Wt 228.0 lb

## 2015-09-11 DIAGNOSIS — I5032 Chronic diastolic (congestive) heart failure: Secondary | ICD-10-CM

## 2015-09-11 DIAGNOSIS — I471 Supraventricular tachycardia: Secondary | ICD-10-CM

## 2015-09-11 DIAGNOSIS — I2510 Atherosclerotic heart disease of native coronary artery without angina pectoris: Secondary | ICD-10-CM | POA: Diagnosis not present

## 2015-09-11 DIAGNOSIS — I340 Nonrheumatic mitral (valve) insufficiency: Secondary | ICD-10-CM | POA: Diagnosis not present

## 2015-09-11 MED ORDER — AMIODARONE HCL 200 MG PO TABS: 200.0000 mg | ORAL_TABLET | Freq: Two times a day (BID) | ORAL | Status: DC

## 2015-09-11 NOTE — Progress Notes (Signed)
[address]       [place] [address]             [phone]     CARDIOTHORACIC SURGERY OFFICE NOTE  Referring Provider is [name], MD PCP is [name], MD   HPI:  Patient returns to the office today for follow-up of severe symptomatic primary mitral regurgitation. He was originally seen in consultation on 07/13/2015. Since then he underwent follow-up left and right heart catheterization by Dr. Burt Knack on 08/08/2015. Diagnostic catheterization confirmed that the previously placed stents in the RCA and LAD coronary arteries remained widely patent and free of significant disease. There had been no other significant progression of the patient's otherwise nonobstructive coronary artery disease. There was mild pulmonary hypertension and hemodynamic findings consistent with severe mitral regurgitation including large V waves on pulmonary capillary wedge tracing.  CT angiography demonstrates widely patent aorta and iliac arteries bilaterally with no significant atherosclerotic disease. The patient was noted to have an incidental finding of a 12 mm "ill-defined enhancing structure" in the liver that appears most consistent with a benign hemangioma.  The patient returns to the office today to discuss treatment options further. He reports no new problems or complaints. His primary complaint remains progressive fatigue and chronic lower extremity edema. He has a difficult historian and somewhat inconsistent in his response to questions. He does admit to some exertional shortness of breath. He blames much of his symptoms on medications.   Current Outpatient Prescriptions  Medication Sig Dispense Refill  . amLODipine (NORVASC) 5 MG tablet Take 1 tablet (5 mg total) by mouth daily. 30 tablet 11  . aspirin EC 81 MG tablet Take 81 mg by mouth daily.    Marland Kitchen diltiazem (CARDIZEM CD) 120 MG 24 hr capsule Take 1 capsule (120 mg total) by mouth daily. 30 capsule 6  .  finasteride (PROSCAR) 5 MG tablet Take 1 tablet (5 mg total) by mouth daily. 30 tablet 2  . fluticasone (FLONASE) 50 MCG/ACT nasal spray Place 1 spray into both nostrils daily as needed for allergies or rhinitis.    . furosemide (LASIX) 40 MG tablet Take 1.5 tablets (60 mg total) by mouth 2 (two) times daily. (Patient taking differently: Take 40-60 mg by mouth 2 (two) times daily. 40 mg in the morning and 60 mg at night) 120 tablet 5  . GARLIC PO Take 1 tablet by mouth daily.    . Menthol-Methyl Salicylate (MUSCLE RUB) 10-15 % CREA Apply 1 application topically 2 (two) times daily as needed for muscle pain.     . potassium chloride SA (K-DUR,KLOR-CON) 20 MEQ tablet Take 2 tablets (40 mEq total) by mouth daily. 60 tablet 11  . pravastatin (PRAVACHOL) 40 MG tablet Take 1 tablet (40 mg total) by mouth daily at 6 PM. (Patient taking differently: Take 40 mg by mouth daily. ) 30 tablet 11  . tamsulosin (FLOMAX) 0.4 MG CAPS capsule Take 1 capsule (0.4 mg total) by mouth daily. 90 capsule 1  . ticagrelor (BRILINTA) 90 MG TABS tablet Take 1 tablet (90 mg total) by mouth 2 (two) times daily. 180 tablet 3  . traMADol (ULTRAM) 50 MG tablet TAKE ONE (1) TABLET EVERY 6 HOURS 40 tablet 0   No current facility-administered medications for this visit.      Physical Exam:   BP 130/76 mmHg  Pulse 86  Resp 16  Ht 5\' 7"  (1.702 m)  Wt 228 lb (103.42 kg)  BMI 35.70  kg/m2  SpO2 96%  General:  Elderly, mildly obese, somewhat slow moving, but in no distress  Chest:   Clear to auscultation  CV:   Regular rate and rhythm with systolic murmur heard best along the sternal border  Incisions:  n/a  Abdomen:  Soft and nontender  Extremities:  Warm and well-perfused with moderate-severe lower extremity edema below the knee   Diagnostic Tests:  CARDIAC CATHETERIZATION Procedures    Right/Left Heart Cath and Coronary Angiography    Conclusion     Dist LAD lesion, 30% stenosed.  Mid RCA-2 lesion, 25%  stenosed.  Mid Cx lesion, 30% stenosed.  1. Continued stent patency in the RCA and LAD 2. Mild diffuse CAD as outlined 3. Hemodynamics consistent with severe mitral regurgitation (large V-waves in PCWP tracing, mild pulmonary HTN)     Indications    Severe mitral regurgitation [I34.0 (ICD-10-CM)]    Technique and Indications    INDICATION: Severe mitral regurgitation, known CAD s/p LAD and RCA stenting in April 2016, preoperative study  PROCEDURAL DETAILS: The left groin was prepped, draped, and anesthetized with 1% lidocaine. Using the modified Seldinger technique a 4 French sheath was placed in the right femoral artery and a 7 French sheath was placed in the right femoral vein. A Swan-Ganz catheter was used for the right heart catheterization. Standard protocol was followed for recording of right heart pressures and sampling of oxygen saturations. Fick cardiac output was calculated. Standard Judkins catheters were used for selective coronary angiography. There were no immediate procedural complications. The patient was transferred to the post catheterization recovery area for further monitoring.  Estimated blood loss <50 mL. There were no immediate complications during the procedure.    Coronary Findings    Dominance: Right   Left Anterior Descending   . Mid LAD lesion, 0% stenosed. Previously placed Mid LAD drug eluting stent is patent. Widely patent mid-LAD stent   . Dist LAD lesion, 30% stenosed. There is diffuse irregularity and mild stenosis of the mid and distal LAD     Left Circumflex   . Mid Cx lesion, 30% stenosed.     Right Coronary Artery   . Mid RCA-1 lesion, 0% stenosed. Previously placed Mid RCA-1 drug eluting stent is patent. Patent RCA stent   . Mid RCA-2 lesion, 25% stenosed. Mild diffuse irregularity of the mid/distal RCA       Right Heart Pressures Hemodynamic findings consistent with mild pulmonary hypertension. Elevated LV EDP consistent with  volume overload.    Coronary Diagrams    Diagnostic Diagram            Implants    Name ID Temporary Type Supply   No information to display    PACS Images    Show images for Cardiac catheterization     Link to Procedure Log    Procedure Log      Hemo Data       Most Recent Value   Fick Cardiac Output  6.09 L/min   Fick Cardiac Output Index  2.79 (L/min)/BSA   RA A Wave  9 mmHg   RA V Wave  6 mmHg   RA Mean  6 mmHg   RV Systolic Pressure  45 mmHg   RV Diastolic Pressure  6 mmHg   RV EDP  8 mmHg   PA Systolic Pressure  46 mmHg   PA Diastolic Pressure  13 mmHg   PA Mean  27 mmHg   PW A Wave  22 mmHg  PW V Wave  31 mmHg   PW Mean  20 mmHg   AO Systolic Pressure  [id number] mmHg   AO Diastolic Pressure  59 mmHg   AO Mean  84 mmHg   LV Systolic Pressure  123456 mmHg   LV Diastolic Pressure  10 mmHg   LV EDP  20 mmHg   Arterial Occlusion Pressure Extended Systolic Pressure  [id number] mmHg   Arterial Occlusion Pressure Extended Diastolic Pressure  62 mmHg   Arterial Occlusion Pressure Extended Mean Pressure  90 mmHg   Left Ventricular Apex Extended Systolic Pressure  123456 mmHg   Left Ventricular Apex Extended Diastolic Pressure  8 mmHg   Left Ventricular Apex Extended EDP Pressure  22 mmHg   QP/QS  1   TPVR Index  9.66 HRUI   TSVR Index  30.06 HRUI   PVR SVR Ratio  0.09   TPVR/TSVR Ratio  0.32      Impression:  Patient has stage D severe symptomatic primary mitral regurgitation. He presents with chronic symptoms of fatigue, exertional shortness of breath and bilateral lower extremity edema.  He appears to be stable although somewhat volume overloaded on exam.  Transesophageal echocardiogram confirmed the presence of multiple ruptured primary chordae tendineae with a flail segment of the anterior leaflet and severe mitral regurgitation.  I have personally reviewed the patient's recent diagnostic cardiac catheterization which reveals  continued patency of the stents placed in the LAD and RCA last spring. The patient has mild pulmonary hypertension. Although risks of surgery will be somewhat high because of the patient's advanced age and mildly limited physical mobility, I agree that he would probably best be treated with mitral valve repair. He appears to be an acceptable candidate for minimally invasive approach. Concomitant Maze procedure or clipping of the left atrial appendage could be considered because of the patient's history of SVT.  Plan:  The patient and his wife were again counseled at length regarding the indications, risks and potential benefits of mitral valve repair.  The rationale for elective surgery has been explained, including a comparison between surgery and continued medical therapy with close follow-up.  The likelihood of successful and durable valve repair has been discussed with particular reference to the findings of their recent echocardiogram.  Based upon these findings and previous experience, I have quoted them a greater than 80 percent likelihood of successful valve repair.  In the unlikely event that their valve cannot be successfully repaired, we discussed the possibility of replacing the mitral valve using a mechanical prosthesis with the attendant need for long-term anticoagulation versus the alternative of replacing it using a bioprosthetic tissue valve with its potential for late structural valve deterioration and failure, depending upon the patient's longevity.  The patient specifically requests that if the mitral valve must be replaced that it be done using a bioprosthetic tissue valve.   The patient understands and accepts all potential risks of surgery including but not limited to risk of death, stroke or other neurologic complication, myocardial infarction, congestive heart failure, respiratory failure, renal failure, bleeding requiring transfusion and/or reexploration, arrhythmia, infection or other  wound complications, pneumonia, pleural and/or pericardial effusion, pulmonary embolus, aortic dissection or other major vascular complication, or delayed complications related to valve repair or replacement including but not limited to structural valve deterioration and failure, thrombosis, embolization, endocarditis, or paravalvular leak.  Alternative surgical approaches have been discussed including a comparison between conventional sternotomy and minimally-invasive techniques.  The relative risks and benefits of each have  been reviewed as they pertain to the patient's specific circumstances, and all of their questions have been addressed.  Specific risks potentially related to the minimally-invasive approach were discussed at length, including but not limited to risk of conversion to full or partial sternotomy, aortic dissection or other major vascular complication, unilateral acute lung injury or pulmonary edema, phrenic nerve dysfunction or paralysis, rib fracture, chronic pain, lung hernia, or lymphocele. All of their questions have been answered.  In addition to other potential risks and complications from the surgery, I have made the patient aware of the recent [place] concerning the risk of infection by Myocobacterium chimaera related to the use of Stockert 3T heater-cooler equipment during cardiac surgery. I discussed with the patient the low risk of infection, as well as our compliance with the most current FDA recommendations to minimize infection and testing of all devices for contamination. The patient has been made aware of the limited alternatives to immediately replacing the current equipment. The patient has been informed regarding the risks associated with waiting to proceed with needed surgery and that such risks are greater than the risk of infection related to the use of the heater-cooler device. I did make the patient aware that after careful review of the patients having  cardiac surgery at [facility] we have no evidence that heater/cooler related infections have occurred at [facility]. We discussed that this is a slow-growing bacterium, such that it can take some period of time for symptoms to develop.  We tentatively plan to proceed with surgery on Wednesday, 10/04/2015. The patient has been instructed to stop taking Brilinta 7 days prior to surgery.  In addition, he has been given a prescription for amiodarone to begin 7 days prior to surgery. Prior to surgery he will return for follow-up on Monday, 10/02/2015.  I spent in excess of 30 minutes during the conduct of this office consultation and >50% of this time involved direct face-to-face encounter with the patient for counseling and/or coordination of their care.   Valentina Gu. Roxy Manns, MD 09/11/2015 3:55 PM

## 2015-09-11 NOTE — Patient Instructions (Signed)
Patient has been instructed to stop taking Brilinta and start taking Amiodarone 7 days before your surgery (February 22nd)  Patient should continue taking all other medications without change through the day before surgery.  Patient should have nothing to eat or drink after midnight the night before surgery.  On the morning of surgery patient should take only Cardizem CD with a sip of water.

## 2015-09-12 ENCOUNTER — Other Ambulatory Visit: Payer: Self-pay | Admitting: *Deleted

## 2015-09-12 DIAGNOSIS — I340 Nonrheumatic mitral (valve) insufficiency: Secondary | ICD-10-CM

## 2015-09-28 ENCOUNTER — Other Ambulatory Visit: Payer: Self-pay | Admitting: Family Medicine

## 2015-09-28 NOTE — Telephone Encounter (Signed)
Refill appropriate and filled per protocol. 

## 2015-09-28 NOTE — Progress Notes (Signed)
Note opened in error.  This encounter was created in error - please disregard.

## 2015-09-29 ENCOUNTER — Encounter: Payer: Medicare Other | Admitting: Cardiology

## 2015-10-02 ENCOUNTER — Encounter (HOSPITAL_COMMUNITY)
Admission: RE | Admit: 2015-10-02 | Discharge: 2015-10-02 | Disposition: A | Discharge status: Home / Self Care | Payer: Medicare Other | Source: Ambulatory Visit | Attending: Thoracic Surgery (Cardiothoracic Vascular Surgery) | Admitting: Thoracic Surgery (Cardiothoracic Vascular Surgery)

## 2015-10-02 ENCOUNTER — Encounter: Payer: Self-pay | Admitting: Thoracic Surgery (Cardiothoracic Vascular Surgery)

## 2015-10-02 ENCOUNTER — Ambulatory Visit (HOSPITAL_COMMUNITY)
Admission: RE | Admit: 2015-10-02 | Discharge: 2015-10-02 | Disposition: A | Discharge status: Home / Self Care | Payer: Medicare Other | Source: Ambulatory Visit | Attending: Thoracic Surgery (Cardiothoracic Vascular Surgery) | Admitting: Thoracic Surgery (Cardiothoracic Vascular Surgery)

## 2015-10-02 ENCOUNTER — Ambulatory Visit (HOSPITAL_BASED_OUTPATIENT_CLINIC_OR_DEPARTMENT_OTHER)
Admission: RE | Admit: 2015-10-02 | Discharge: 2015-10-02 | Disposition: A | Discharge status: Home / Self Care | Payer: Medicare Other | Source: Ambulatory Visit | Attending: Thoracic Surgery (Cardiothoracic Vascular Surgery) | Admitting: Thoracic Surgery (Cardiothoracic Vascular Surgery)

## 2015-10-02 ENCOUNTER — Ambulatory Visit (INDEPENDENT_AMBULATORY_CARE_PROVIDER_SITE_OTHER): Payer: Medicare Other | Admitting: Thoracic Surgery (Cardiothoracic Vascular Surgery)

## 2015-10-02 ENCOUNTER — Encounter (HOSPITAL_COMMUNITY): Payer: Self-pay

## 2015-10-02 VITALS — BP 127/57 | HR 66 | Temp 97.5°F | Resp 16 | Ht 67.0 in | Wt 220.1 lb

## 2015-10-02 VITALS — BP 138/71 | HR 63 | Resp 20 | Ht 67.0 in | Wt 220.0 lb

## 2015-10-02 DIAGNOSIS — I130 Hypertensive heart and chronic kidney disease with heart failure and stage 1 through stage 4 chronic kidney disease, or unspecified chronic kidney disease: Secondary | ICD-10-CM | POA: Diagnosis not present

## 2015-10-02 DIAGNOSIS — R5381 Other malaise: Secondary | ICD-10-CM | POA: Diagnosis not present

## 2015-10-02 DIAGNOSIS — M1990 Unspecified osteoarthritis, unspecified site: Secondary | ICD-10-CM | POA: Diagnosis not present

## 2015-10-02 DIAGNOSIS — I272 Other secondary pulmonary hypertension: Secondary | ICD-10-CM

## 2015-10-02 DIAGNOSIS — I471 Supraventricular tachycardia: Secondary | ICD-10-CM | POA: Diagnosis not present

## 2015-10-02 DIAGNOSIS — Z981 Arthrodesis status: Secondary | ICD-10-CM | POA: Insufficient documentation

## 2015-10-02 DIAGNOSIS — I081 Rheumatic disorders of both mitral and tricuspid valves: Secondary | ICD-10-CM | POA: Diagnosis not present

## 2015-10-02 DIAGNOSIS — Z955 Presence of coronary angioplasty implant and graft: Secondary | ICD-10-CM | POA: Insufficient documentation

## 2015-10-02 DIAGNOSIS — D62 Acute posthemorrhagic anemia: Secondary | ICD-10-CM | POA: Diagnosis not present

## 2015-10-02 DIAGNOSIS — E785 Hyperlipidemia, unspecified: Secondary | ICD-10-CM | POA: Diagnosis not present

## 2015-10-02 DIAGNOSIS — I340 Nonrheumatic mitral (valve) insufficiency: Secondary | ICD-10-CM

## 2015-10-02 DIAGNOSIS — Z01812 Encounter for preprocedural laboratory examination: Secondary | ICD-10-CM

## 2015-10-02 DIAGNOSIS — Z87820 Personal history of traumatic brain injury: Secondary | ICD-10-CM | POA: Diagnosis not present

## 2015-10-02 DIAGNOSIS — Z8546 Personal history of malignant neoplasm of prostate: Secondary | ICD-10-CM | POA: Insufficient documentation

## 2015-10-02 DIAGNOSIS — I43 Cardiomyopathy in diseases classified elsewhere: Secondary | ICD-10-CM | POA: Diagnosis not present

## 2015-10-02 DIAGNOSIS — Z87891 Personal history of nicotine dependence: Secondary | ICD-10-CM | POA: Insufficient documentation

## 2015-10-02 DIAGNOSIS — Z01818 Encounter for other preprocedural examination: Secondary | ICD-10-CM | POA: Insufficient documentation

## 2015-10-02 DIAGNOSIS — Z0183 Encounter for blood typing: Secondary | ICD-10-CM

## 2015-10-02 DIAGNOSIS — I5032 Chronic diastolic (congestive) heart failure: Secondary | ICD-10-CM

## 2015-10-02 DIAGNOSIS — J9811 Atelectasis: Secondary | ICD-10-CM | POA: Diagnosis not present

## 2015-10-02 DIAGNOSIS — E1122 Type 2 diabetes mellitus with diabetic chronic kidney disease: Secondary | ICD-10-CM | POA: Diagnosis not present

## 2015-10-02 DIAGNOSIS — Z7982 Long term (current) use of aspirin: Secondary | ICD-10-CM | POA: Insufficient documentation

## 2015-10-02 DIAGNOSIS — Z79899 Other long term (current) drug therapy: Secondary | ICD-10-CM

## 2015-10-02 DIAGNOSIS — N183 Chronic kidney disease, stage 3 (moderate): Secondary | ICD-10-CM | POA: Diagnosis not present

## 2015-10-02 DIAGNOSIS — I511 Rupture of chordae tendineae, not elsewhere classified: Secondary | ICD-10-CM | POA: Diagnosis not present

## 2015-10-02 DIAGNOSIS — I4891 Unspecified atrial fibrillation: Secondary | ICD-10-CM | POA: Diagnosis not present

## 2015-10-02 DIAGNOSIS — I110 Hypertensive heart disease with heart failure: Secondary | ICD-10-CM | POA: Insufficient documentation

## 2015-10-02 DIAGNOSIS — I2510 Atherosclerotic heart disease of native coronary artery without angina pectoris: Secondary | ICD-10-CM | POA: Diagnosis not present

## 2015-10-02 DIAGNOSIS — I252 Old myocardial infarction: Secondary | ICD-10-CM | POA: Insufficient documentation

## 2015-10-02 DIAGNOSIS — R5382 Chronic fatigue, unspecified: Secondary | ICD-10-CM | POA: Insufficient documentation

## 2015-10-02 DIAGNOSIS — E119 Type 2 diabetes mellitus without complications: Secondary | ICD-10-CM | POA: Diagnosis not present

## 2015-10-02 DIAGNOSIS — I5022 Chronic systolic (congestive) heart failure: Secondary | ICD-10-CM | POA: Diagnosis not present

## 2015-10-02 DIAGNOSIS — G8929 Other chronic pain: Secondary | ICD-10-CM | POA: Diagnosis not present

## 2015-10-02 DIAGNOSIS — I442 Atrioventricular block, complete: Secondary | ICD-10-CM | POA: Diagnosis not present

## 2015-10-02 DIAGNOSIS — M109 Gout, unspecified: Secondary | ICD-10-CM | POA: Diagnosis not present

## 2015-10-02 DIAGNOSIS — D696 Thrombocytopenia, unspecified: Secondary | ICD-10-CM | POA: Diagnosis not present

## 2015-10-02 DIAGNOSIS — I9789 Other postprocedural complications and disorders of the circulatory system, not elsewhere classified: Secondary | ICD-10-CM | POA: Diagnosis not present

## 2015-10-02 HISTORY — DX: Calculus of kidney: N20.0

## 2015-10-02 LAB — PULMONARY FUNCTION TEST
DL/VA % PRED: 90 %
DL/VA: 4.04 ml/min/mmHg/L
DLCO UNC % PRED: 55 %
DLCO cor % pred: 56 %
DLCO cor: 16.78 ml/min/mmHg
DLCO unc: 16.49 ml/min/mmHg
FEF 25-75 PRE: 1.27 L/s
FEF 25-75 Post: 1.27 L/sec
FEF2575-%CHANGE-POST: 0 %
FEF2575-%Pred-Post: 81 %
FEF2575-%Pred-Pre: 80 %
FEV1-%Change-Post: 0 %
FEV1-%PRED-PRE: 73 %
FEV1-%Pred-Post: 73 %
FEV1-PRE: 1.78 L
FEV1-Post: 1.79 L
FEV1FVC-%CHANGE-POST: 6 %
FEV1FVC-%Pred-Pre: 104 %
FEV6-%CHANGE-POST: -4 %
FEV6-%PRED-POST: 69 %
FEV6-%PRED-PRE: 72 %
FEV6-PRE: 2.37 L
FEV6-Post: 2.26 L
FEV6FVC-%Change-Post: 1 %
FEV6FVC-%PRED-PRE: 107 %
FEV6FVC-%Pred-Post: 108 %
FVC-%CHANGE-POST: -5 %
FVC-%PRED-POST: 64 %
FVC-%Pred-Pre: 68 %
FVC-Post: 2.26 L
FVC-Pre: 2.4 L
POST FEV6/FVC RATIO: 100 %
Post FEV1/FVC ratio: 79 %
Pre FEV1/FVC ratio: 74 %
Pre FEV6/FVC Ratio: 99 %
RV % PRED: 73 %
RV: 1.94 L
TLC % pred: 65 %
TLC: 4.37 L

## 2015-10-02 LAB — BLOOD GAS, ARTERIAL
Acid-Base Excess: 4.5 mmol/L — ABNORMAL HIGH (ref 0.0–2.0)
Bicarbonate: 28 mEq/L — ABNORMAL HIGH (ref 20.0–24.0)
Drawn by: 42180
FIO2: 0.21
O2 Saturation: 97 %
PCO2 ART: 38.1 mmHg (ref 35.0–45.0)
PH ART: 7.479 — AB (ref 7.350–7.450)
PO2 ART: 88.6 mmHg (ref 80.0–100.0)
Patient temperature: 98.6
TCO2: 29.2 mmol/L (ref 0–100)

## 2015-10-02 LAB — COMPREHENSIVE METABOLIC PANEL
ALK PHOS: 84 U/L (ref 38–126)
ALT: 15 U/L — AB (ref 17–63)
AST: 26 U/L (ref 15–41)
Albumin: 2.7 g/dL — ABNORMAL LOW (ref 3.5–5.0)
Anion gap: 14 (ref 5–15)
BUN: 14 mg/dL (ref 6–20)
CALCIUM: 8.7 mg/dL — AB (ref 8.9–10.3)
CO2: 24 mmol/L (ref 22–32)
CREATININE: 1.18 mg/dL (ref 0.61–1.24)
Chloride: 97 mmol/L — ABNORMAL LOW (ref 101–111)
GFR, EST NON AFRICAN AMERICAN: 55 mL/min — AB (ref >60)
Glucose, Bld: 235 mg/dL — ABNORMAL HIGH (ref 65–99)
Potassium: 3 mmol/L — ABNORMAL LOW (ref 3.5–5.1)
Sodium: 135 mmol/L (ref 135–145)
TOTAL PROTEIN: 6.6 g/dL (ref 6.5–8.1)
Total Bilirubin: 1.3 mg/dL — ABNORMAL HIGH (ref 0.3–1.2)

## 2015-10-02 LAB — URINALYSIS, ROUTINE W REFLEX MICROSCOPIC
Bilirubin Urine: NEGATIVE
GLUCOSE, UA: NEGATIVE mg/dL
Hgb urine dipstick: NEGATIVE
Ketones, ur: NEGATIVE mg/dL
LEUKOCYTES UA: NEGATIVE
Nitrite: NEGATIVE
PH: 7 (ref 5.0–8.0)
PROTEIN: NEGATIVE mg/dL
Specific Gravity, Urine: 1.014 (ref 1.005–1.030)

## 2015-10-02 LAB — CBC
HCT: 37.8 % — ABNORMAL LOW (ref 39.0–52.0)
HEMOGLOBIN: 12.5 g/dL — AB (ref 13.0–17.0)
MCH: 30.6 pg (ref 26.0–34.0)
MCHC: 33.1 g/dL (ref 30.0–36.0)
MCV: 92.4 fL (ref 78.0–100.0)
Platelets: 160 10*3/uL (ref 150–400)
RBC: 4.09 MIL/uL — AB (ref 4.22–5.81)
RDW: 12.9 % (ref 11.5–15.5)
WBC: 8 10*3/uL (ref 4.0–10.5)

## 2015-10-02 LAB — PROTIME-INR
INR: 1.34 (ref 0.00–1.49)
PROTHROMBIN TIME: 16.7 s — AB (ref 11.6–15.2)

## 2015-10-02 LAB — APTT: aPTT: 31 seconds (ref 24–37)

## 2015-10-02 LAB — SURGICAL PCR SCREEN
MRSA, PCR: NEGATIVE
STAPHYLOCOCCUS AUREUS: NEGATIVE

## 2015-10-02 LAB — ABO/RH: ABO/RH(D): A POS

## 2015-10-02 MED ORDER — ALBUTEROL SULFATE (2.5 MG/3ML) 0.083% IN NEBU
2.5000 mg | INHALATION_SOLUTION | Freq: Once | RESPIRATORY_TRACT | Status: AC
  Administered 2015-10-02: 2.5 mg via RESPIRATORY_TRACT

## 2015-10-02 NOTE — Progress Notes (Signed)
Pre-op Cardiac Surgery  Carotid Findings:  No evidence of stenosis in bilateral carotid arteries. Vertebral arteries demonstrate antegrade flow.   Upper Extremity Right Left  Brachial Pressures 143 139  Radial Waveforms Triphasic Triphasic  Ulnar Waveforms Triphasic Triphasic  Palmar Arch (Allen's Test) Decreased 50% WNL    Findings: Right Doppler waveforms decreased >50% with compression of both radial and ulnar arteries. Left arm Doppler waveforms appeared to be WNL. Oda Cogan, BS, RDMS, RVT

## 2015-10-02 NOTE — Progress Notes (Signed)
[address]       [place] [address]             [phone]     CARDIOTHORACIC SURGERY OFFICE NOTE  Referring Provider is [name], MD PCP is [name], MD   HPI:  Patient returns to the office today for follow-up of severe symptomatic primary mitral regurgitation. He was originally seen in consultation on 07/13/2015 and more recently he was seen in follow-up on 09/11/2015. At that time we made plans for minimally invasive mitral valve repair which has been scheduled for later this week.  The patient returns to the office today for follow-up. He states that this past weekend he had an episode of increased shortness of breath and dizziness. He has not had any chest pain or chest tightness. He has not had any fevers or chills. He reports no other significant changes. He hopes to proceed with mitral valve repair as previously scheduled.   Current Outpatient Prescriptions  Medication Sig Dispense Refill  . amLODipine (NORVASC) 5 MG tablet Take 1 tablet (5 mg total) by mouth daily. 30 tablet 11  . aspirin EC 81 MG tablet Take 81 mg by mouth daily.    Marland Kitchen diltiazem (CARDIZEM CD) 120 MG 24 hr capsule Take 1 capsule (120 mg total) by mouth daily. 30 capsule 6  . finasteride (PROSCAR) 5 MG tablet TAKE ONE (1) TABLET EACH DAY 30 tablet 3  . fluticasone (FLONASE) 50 MCG/ACT nasal spray Place 1 spray into both nostrils daily as needed for allergies or rhinitis.    . furosemide (LASIX) 40 MG tablet Take 1.5 tablets (60 mg total) by mouth 2 (two) times daily. (Patient taking differently: Take 40-60 mg by mouth 2 (two) times daily. 40 mg in the morning and 60 mg at night) 120 tablet 5  . GARLIC PO Take 1 tablet by mouth daily.    . Menthol-Methyl Salicylate (MUSCLE RUB) 10-15 % CREA Apply 1 application topically 2 (two) times daily as needed for muscle pain.     . potassium chloride SA (K-DUR,KLOR-CON) 20 MEQ tablet Take 2 tablets (40 mEq total) by mouth daily. 60  tablet 11  . pravastatin (PRAVACHOL) 40 MG tablet Take 1 tablet (40 mg total) by mouth daily at 6 PM. (Patient taking differently: Take 40 mg by mouth daily. ) 30 tablet 11  . tamsulosin (FLOMAX) 0.4 MG CAPS capsule Take 1 capsule (0.4 mg total) by mouth daily. 90 capsule 1  . traMADol (ULTRAM) 50 MG tablet TAKE ONE (1) TABLET EVERY 6 HOURS 40 tablet 0  . amiodarone (PACERONE) 200 MG tablet Take 1 tablet (200 mg total) by mouth 2 (two) times daily with a meal. 60 tablet 0  . ticagrelor (BRILINTA) 90 MG TABS tablet Take 1 tablet (90 mg total) by mouth 2 (two) times daily. (Patient not taking: Reported on 10/02/2015) 180 tablet 3   No current facility-administered medications for this visit.      Physical Exam:   BP 138/71 mmHg  Pulse 63  Resp 20  Ht 5\' 7"  (1.702 m)  Wt 220 lb (99.791 kg)  BMI 34.45 kg/m2  SpO2 93%  General:  Mildly obese, elderly, and somewhat slow moving but in no acute distress  Chest:   Clear to auscultation with slight inspiratory crackles at lung bases  CV:   Regular rate and rhythm with prominent systolic murmur  Incisions:  n/a  Abdomen:  Soft nontender  Extremities:  Warm and  well-perfused with moderate bilateral lower extremity edema  Diagnostic Tests:  CHEST 2 VIEW  COMPARISON: 07/05/2015  FINDINGS: Enlargement of cardiac silhouette.  Mild atherosclerotic calcification aorta.  Mediastinal contours and pulmonary vascularity normal.  Lungs clear.  No pleural effusion or pneumothorax.  Diffuse osseous demineralization.  IMPRESSION: Enlargement of cardiac silhouette.  No acute abnormalities.   Electronically Signed  By: Lavonia Dana M.D.  On: 10/02/2015 14:48    Impression:  Patient has stage D severe symptom at a primary mitral regurgitation. He presents with chronic symptoms of fatigue, exertional shortness of breath, and bilateral lower extremity edema consistent with chronic diastolic congestive heart failure, [place]  Heart Association functional class III. 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 Left ventricular systolic function remains preserved.  Diagnostic cardiac catheterization demonstrates continued patency of the stents placed in the left anterior ascending coronary artery and the right coronary artery. The patient has mild pulmonary hypertension.   Plan:  I have again reviewed the indications, risks, and potential benefits of surgery with the patient and his wife in the office today. All of their questions have been addressed. We plan to proceed with surgery on Wednesday, 10/04/2015 as previously scheduled.  The patient apparently never filled his prescription for amiodarone which have been ordered to begin prior to surgery an effort to decrease his risk of perioperative dysrhythmias. At this point I do not feel that we should postpone surgery and amiodarone can be started postoperatively as needed.  The patient did stop taking Brilinta last week as he had been instructed.  I spent in excess of 15 minutes during the conduct of this office consultation and >50% of this time involved direct face-to-face encounter with the patient for counseling and/or coordination of their care.    Valentina Gu. Roxy Manns, MD 10/02/2015 5:01 PM

## 2015-10-02 NOTE — Patient Instructions (Signed)
   Patient should continue taking all current medications without change through the day before surgery.  Patient should have nothing to eat or drink after midnight the night before surgery.  On the morning of surgery patient should take only amlodipine (Norvasc) with a sip of water.

## 2015-10-02 NOTE — Progress Notes (Signed)
Urine speci obtained and given to lab

## 2015-10-02 NOTE — Progress Notes (Signed)
States he is not able to obtain urine at this time instructed to bring the speci by before he leaves the hospital, after the PFT and Dopplers voices understanding.

## 2015-10-02 NOTE — Pre-Procedure Instructions (Signed)
[name]  10/02/2015      [facility] [place] [address] Phone: [phone] Fax: [phone]  [place], [place] [address] [place] [address] Phone: [phone] Fax: [phone]     Your procedure is scheduled on March 1  Report to [facility] at 630 A.M.  Call this number if you have problems the morning of surgery:  [phone]   Remember:  Do not eat food or drink liquids after midnight.  Take these medicines the morning of surgery with A SIP OF WATER amiodarone (Pacerone), Amlodipine (norvasc), Diltiazem (Cardizem), Finasteride (Proscar), Fluticasone (Flonase) if needed, Tamsulosin (Flomax), Tramadol (Ultram)   Stop taking Aspirin,  Aleve, Ibuprofen, Advil, Motrin, Herbal medications, Fish Oil How to Manage Your Diabetes Before Surgery   Why is it important to control my blood sugar before and after surgery?   Improving blood sugar levels before and after surgery helps healing and can limit problems.  A way of improving blood sugar control is eating a healthy diet by:  - Eating less sugar and carbohydrates  - Increasing activity/exercise  - Talk with your doctor about reaching your blood sugar goals  High blood sugars (greater than 180 mg/dL) can raise your risk of infections and slow down your recovery so you will need to focus on controlling your diabetes during the weeks before surgery.  Make sure that the doctor who takes care of your diabetes knows about your planned surgery including the date and location.  How do I manage my blood sugars before surgery?   Check your blood sugar at least 4 times a day, 2 days before surgery to make sure that they are not too high or low.   Check your blood sugar the morning of your surgery when you wake up and every 2               hours until you get to the Short-Stay unit.  If your  blood sugar is less than 70 mg/dL, you will need to treat for low blood sugar by:  Treat a low blood sugar (less than 70 mg/dL) with 1/2 cup of clear juice (cranberry or apple), 4 glucose tablets, OR glucose gel.  Recheck blood sugar in 15 minutes after treatment (to make sure it is greater than 70 mg/dL).  If blood sugar is not greater than 70 mg/dL on re-check, call [phone] for further instructions.   Report your blood sugar to the Short-Stay nurse when you get to Short-Stay.  References:  [facility], 2007 "How to Manage your Diabetes Before and After Surgery".    Do not wear jewelry, make-up or nail polish.  Do not wear lotions, powders, or perfumes.  You may wear deodorant.  Do not shave 48 hours prior to surgery.  Men may shave face and neck.  Do not bring valuables to the hospital.  [facility] is not responsible for any belongings or valuables.  Contacts, dentures or bridgework may not be worn into surgery.  Leave your suitcase in the car.  After surgery it may be brought to your room.  For patients admitted to the hospital, discharge time will be determined by your treatment team.  Patients discharged the day of surgery will not be allowed to drive home.   Special instructions:  [facility] - Preparing for Surgery  Before surgery, you can  play an important role.  Because skin is not sterile, your skin needs to be as free of germs as possible.  You can reduce the number of germs on you skin by washing with CHG (chlorahexidine gluconate) soap before surgery.  CHG is an antiseptic cleaner which kills germs and bonds with the skin to continue killing germs even after washing.  Please DO NOT use if you have an allergy to CHG or antibacterial soaps.  If your skin becomes reddened/irritated stop using the CHG and inform your nurse when you arrive at Short Stay.  Do not shave (including legs and underarms) for at least 48 hours prior to the first CHG  shower.  You may shave your face.  Please follow these instructions carefully:   1.  Shower with CHG Soap the night before surgery and the    morning of Surgery.  2.  If you choose to wash your hair, wash your hair first as usual with your   normal shampoo.  3.  After you shampoo, rinse your hair and body thoroughly to remove the  Shampoo.  4.  Use CHG as you would any other liquid soap.  You can apply chg directly  to the skin and wash gently with scrungie or a clean washcloth.  5.  Apply the CHG Soap to your body ONLY FROM THE NECK DOWN.   Do not use on open wounds or open sores.  Avoid contact with your eyes,   ears, mouth and genitals (private parts).  Wash genitals (private parts)  with your normal soap.  6.  Wash thoroughly, paying special attention to the area where your surgery will be performed.  7.  Thoroughly rinse your body with warm water from the neck down.  8.  DO NOT shower/wash with your normal soap after using and rinsing off  the CHG Soap.  9.  Pat yourself dry with a clean towel.            10.  Wear clean pajamas.            11.  Place clean sheets on your bed the night of your first shower and do not   sleep with pets.  Day of Surgery  Do not apply any lotions/deoderants the morning of surgery.  Please wear clean clothes to the hospital/surgery center.     Please read over the following fact sheets that you were given. Pain Booklet, Coughing and Deep Breathing, Blood Transfusion Information, MRSA Information and Surgical Site Infection Prevention

## 2015-10-02 NOTE — Progress Notes (Addendum)
PCp is Dr [name] Cardiologist is Dr. Percival Spanish and Dr Mare Ferrari Echo noted from 06-22-15 Stress test noted from 06-07-15 Card cath noted from 08-08-15 Pt reports that he is not diabetic, that the Dr is just watching him.  Reports he has stopped taking Brlinta last dose was 09-27-15 ( states he was told to stop 7 days prior to surgery)

## 2015-10-03 ENCOUNTER — Encounter: Payer: Self-pay | Admitting: Cardiology

## 2015-10-03 LAB — HEMOGLOBIN A1C
HEMOGLOBIN A1C: 6.8 % — AB (ref 4.8–5.6)
Mean Plasma Glucose: 148 mg/dL

## 2015-10-03 MED ORDER — SODIUM CHLORIDE 0.9 % IV SOLN
INTRAVENOUS | Status: DC
  Filled 2015-10-03: qty 2.5

## 2015-10-03 MED ORDER — SODIUM CHLORIDE 0.9 % IV SOLN
1500.0000 mg | INTRAVENOUS | Status: AC
  Administered 2015-10-04: 1500 mg via INTRAVENOUS
  Filled 2015-10-03: qty 1500

## 2015-10-03 MED ORDER — SODIUM CHLORIDE 0.9 % IV SOLN
INTRAVENOUS | Status: DC
  Filled 2015-10-03: qty 40

## 2015-10-03 MED ORDER — METOPROLOL TARTRATE 12.5 MG HALF TABLET
12.5000 mg | ORAL_TABLET | Freq: Once | ORAL | Status: DC
  Filled 2015-10-03: qty 1

## 2015-10-03 MED ORDER — MAGNESIUM SULFATE 50 % IJ SOLN
40.0000 meq | INTRAMUSCULAR | Status: DC
  Filled 2015-10-03: qty 10

## 2015-10-03 MED ORDER — POTASSIUM CHLORIDE 2 MEQ/ML IV SOLN
80.0000 meq | INTRAVENOUS | Status: DC
  Filled 2015-10-03: qty 40

## 2015-10-03 MED ORDER — PAPAVERINE HCL 30 MG/ML IJ SOLN
INTRAMUSCULAR | Status: AC
  Administered 2015-10-04: 500 mL
  Filled 2015-10-03: qty 2.5

## 2015-10-03 MED ORDER — DEXMEDETOMIDINE HCL IN NACL 400 MCG/100ML IV SOLN
0.1000 ug/kg/h | INTRAVENOUS | Status: DC
Start: 2015-10-04 — End: 2015-10-04
  Filled 2015-10-03 (×2): qty 100

## 2015-10-03 MED ORDER — DOPAMINE-DEXTROSE 3.2-5 MG/ML-% IV SOLN
0.0000 ug/kg/min | INTRAVENOUS | Status: AC
  Administered 2015-10-04: 3 ug/kg/min via INTRAVENOUS
  Filled 2015-10-03: qty 250

## 2015-10-03 MED ORDER — VANCOMYCIN HCL 1000 MG IV SOLR
INTRAVENOUS | Status: AC
  Administered 2015-10-04: 1000 mL
  Filled 2015-10-03: qty 1000

## 2015-10-03 MED ORDER — DEXTROSE 5 % IV SOLN
750.0000 mg | INTRAVENOUS | Status: DC
  Filled 2015-10-03: qty 750

## 2015-10-03 MED ORDER — NITROGLYCERIN IN D5W 200-5 MCG/ML-% IV SOLN
2.0000 ug/min | INTRAVENOUS | Status: DC
  Filled 2015-10-03: qty 250

## 2015-10-03 MED ORDER — CHLORHEXIDINE GLUCONATE 0.12 % MT SOLN
15.0000 mL | Freq: Once | OROMUCOSAL | Status: AC
  Administered 2015-10-04: 15 mL via OROMUCOSAL
  Filled 2015-10-03: qty 15

## 2015-10-03 MED ORDER — PHENYLEPHRINE HCL 10 MG/ML IJ SOLN
30.0000 ug/min | INTRAVENOUS | Status: DC
  Filled 2015-10-03: qty 2

## 2015-10-03 MED ORDER — GLUTARALDEHYDE 0.625% SOAKING SOLUTION
TOPICAL | Status: DC | PRN
  Filled 2015-10-03: qty 50

## 2015-10-03 MED ORDER — SODIUM CHLORIDE 0.9 % IV SOLN
INTRAVENOUS | Status: DC
  Filled 2015-10-03: qty 30

## 2015-10-03 MED ORDER — DEXTROSE 5 % IV SOLN
1.5000 g | INTRAVENOUS | Status: AC
  Administered 2015-10-04: .75 g via INTRAVENOUS
  Administered 2015-10-04: 1.5 g via INTRAVENOUS
  Filled 2015-10-03 (×2): qty 1.5

## 2015-10-03 MED ORDER — CHLORHEXIDINE GLUCONATE 4 % EX LIQD: 30.0000 mL | CUTANEOUS | Status: DC

## 2015-10-03 MED ORDER — EPINEPHRINE HCL 1 MG/ML IJ SOLN
0.0000 ug/min | INTRAVENOUS | Status: DC
  Filled 2015-10-03: qty 4

## 2015-10-03 NOTE — Anesthesia Preprocedure Evaluation (Addendum)
Anesthesia Evaluation  Patient identified by MRN, date of birth, ID band Patient awake    Reviewed: Allergy & Precautions, H&P , NPO status , Patient's Chart, lab work & pertinent test results  History of Anesthesia Complications Negative for: history of anesthetic complications  Airway Mallampati: II  TM Distance: >3 FB Neck ROM: Limited    Dental no notable dental hx. (+) Edentulous Upper, Edentulous Lower   Pulmonary former smoker,    Pulmonary exam normal breath sounds clear to auscultation       Cardiovascular hypertension, Pt. on medications and Pt. 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  S/P stents to LAD and RCA   Neuro/Psych PSYCHIATRIC DISORDERS negative neurological ROS     GI/Hepatic negative GI ROS, Neg liver ROS,   Endo/Other  negative endocrine ROSdiabetes, Well Controlled, Type 2Morbid obesity  Renal/GU Renal InsufficiencyRenal disease     Musculoskeletal  (+) Arthritis ,   Abdominal   Peds  Hematology  (+) anemia ,   Anesthesia Other Findings History of ACDF at C4-C5  Reproductive/Obstetrics negative OB ROS                       Anesthesia Physical Anesthesia Plan  ASA: IV  Anesthesia Plan: General   Post-op Pain Management:    Induction: Intravenous  Airway Management Planned: Double Lumen EBT  Additional Equipment: Arterial line, CVP, 3D TEE, TEE, PA Cath and Ultrasound Guidance Line Placement  Intra-op Plan:   Post-operative Plan: Post-operative intubation/ventilation  Informed Consent: I have reviewed the patients History and Physical, chart, labs and discussed the procedure including the risks, benefits and  alternatives for the proposed anesthesia with the patient or authorized representative who has indicated his/her understanding and acceptance.   Dental Advisory Given  Plan Discussed with: Anesthesiologist, CRNA and Surgeon  Anesthesia Plan Comments: (Left IJ double stick cordis and double lumen central line, left radial A line  Does have preserved EF, severe MR, is obese, renal function preserved  Starting Hct is 37%, Type and screen active, does have baseline metabolic alkalosis likely from lasix to help with volume overload from prolapsed anterior mitral leaflet  B Judd)    Anesthesia Quick Evaluation

## 2015-10-03 NOTE — H&P (Signed)
[address]       [place] [address]             [phone]          CARDIOTHORACIC SURGERY HISTORY AND PHYSICAL EXAM  Referring Provider is [name], MD PCP is [name], MD  Chief Complaint  Patient presents with  . Mitral Regurgitation    Surgical eval, TEE 06/22/15, Cardiac Cath 11/16/14    HPI:  Patient is an 82 year old male with long-standing history of mitral valve prolapse with mitral regurgitation and chronic diastolic congestive heart failure, coronary artery disease status post PCI and stenting of the LAD and RCA using drug-eluting stents, recurrent SVT, pulmonary hypertension, type 2 diabetes mellitus, hyperlipidemia, and chronic back pain who has been referred for surgical consultation to discuss treatment options for management of severe symptomatic primary mitral regurgitation. The patient states that he has been told that he had a heart murmur for nearly all of his life. For at least the past 10 years the patient has also had intermittent history of palpitations tCanal to care with his heart ratehat were eventually diagnosed as paroxysmal SVT. He has been followed by Dr. Percival Spanish since 2014 with primary complaints of exertional fatigue and lower extremity edema. Transthoracic echocardiogram performed April 2014 was reported to demonstrate the presence of mild mitral regurgitation with normal left ventricular systolic function. The patient was admitted to the hospital in April 2016 with acute exacerbation of chronic diastolic congestive heart failure with primary complaint of worsening lower extremity edema despite escalating doses of diuretic therapy. Transthoracic echocardiogram performed at that time revealed the presence of a mobile mass on the each relief of the mitral valve that was felt to be li, coronary lobekely chronic flail segment with at least moderate mitral regurgitation. Transesophageal echocardiogram was  recommended. Troponin levels were slightly elevated but remained flat during that admission. Nuclear stress test was felt to be intermediate risk. The patient underwent diagnostic cardiac catheterization by Dr. Claiborne Billings. He was found to have severe two-vessel coronary artery disease and treated with multivessel PCI and stenting using drug eluding stents in both the LAD and RCA territories. Right heart catheterization was not performed. Transesophageal echocardiogram was not performed. The patient was not referred for surgical consultation at that time.  The patient states that he has not felt any better since he underwent PCI and stenting. He continues to complain primarily of increasing fatigue with increasing bilateral lower extremity edema. He also reports some exertional shortness of breath. He denies any history of resting shortness of breath, PND, orthopnea, or lower extremity edema. He was seen in follow-up with Dr. Percival Spanish in late October and subsequent transthoracic echocardiogram revealed normal left ventricular systolic function with likely flail segment of the anterior leaflet of the mitral valve and severe mitral regurgitation. Shortly after the echocardiogram was performed the patient was hospitalized briefly with an episode of persistent SVT that failed adenosine and required DC cardioversion. The patient was referred to electrophysiology and seen in consultation by Dr. Curt Bears. He subsequently underwent transesophageal echocardiogram by Dr. Oval Linsey on 06/22/2015. 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 There was normal left ventricular systolic function with ejection fraction estimated 60-65%. There was no sign of thrombus in the left atrial cavity or left atrial appendage. There was normal right ventricular size and systolic function. There was mild tricuspid regurgitation.  The  patient was referred for surgical consultation.  The patient is married and lives with his wife in [place]. He has been disabled for more than 20 years secondary to chronic pain in his back. He has remained fairly active physically and functionally independent. He states that his back does not other him too much at this time but that he is limited primarily by chronic exertional fatigue. He describes dyspnea with moderately strenuous activity. He denies any resting shortness of breath, PND, orthopnea, dizzy spells, or syncope. He's never had any chest pain or chest tightness with exertion. He has moderate to severe chronic bilateral lower extremity edema. He has an intermittent dry nonproductive cough.  Patient returns to the office today for follow-up of severe symptomatic primary mitral regurgitation. He was originally seen in consultation on 07/13/2015 and more recently he was seen in follow-up on 09/11/2015. At that time we made plans for minimally invasive mitral valve repair which has been scheduled for later this week. The patient returns to the office today for follow-up. He states that this past weekend he had an episode of increased shortness of breath and dizziness. He has not had any chest pain or chest tightness. He has not had any fevers or chills. He reports no other significant changes. He hopes to proceed with mitral valve repair as previously scheduled.           Past Medical History  Diagnosis Date  . Coronary artery disease     a. 11/2014 NSTEMI: DESx 2 placed to LAD and RCA   . Mitral valve prolapse   . Diabetes mellitus, type II ([place])     a. HgA1c 6.8 in 03/2015  . Mitral regurgitation     a. severe wtih flail leaflet by 2D ECHO (06/05/15)  . SVT (supraventricular tachycardia) ([facility])     a. recurrent, usually responsive to vagal manuevers  . HTN (hypertension)   . Pulmonary HTN ([place])   . Lower extremity edema     a. chronic  . HLD (hyperlipidemia)   .  Hypertension   . OA (osteoarthritis)   . ED (erectile dysfunction)   . Chronic fatigue   . Gout   . Spinal stenosis   . Prostate cancer ([place])   . Severe mitral regurgitation 11/17/2014    Mitral valve prolapse with flail segment of anterior leaflet   . Chronic diastolic (congestive) heart failure ([place])   . Hemangioma of liver 08/02/2015    12 mm enhancing lesion seen on CT - suspicious for benign hemangioma but not diagnostic - MRI recommended  . Dysrhythmia   . Heart murmur   . Shortness of breath dyspnea   . Kidney stones     Past Surgical History  Procedure Laterality Date  . Cardiac surgery      2 cardiac stents  . Joint replacement      shoulder  . Back surgery x 2    . Rt rotator cuff repair    . Cervical spine surgery    . Back surgery    . Left heart catheterization with coronary angiogram N/A 11/16/2014    Procedure: LEFT HEART CATHETERIZATION WITH CORONARY ANGIOGRAM;  Surgeon: Troy Sine, MD;  Location: [facility] CATH LAB;  Service: Cardiovascular;  Laterality: N/A;  . Tee without cardioversion N/A 06/22/2015    Procedure: TRANSESOPHAGEAL ECHOCARDIOGRAM (TEE);  Surgeon: Skeet Latch, MD;  Location: [place];  Service: Cardiovascular;  Laterality: N/A;  . Cardiac catheterization N/A 08/08/2015    Procedure:  Right/Left Heart Cath and Coronary Angiography;  Surgeon: Sherren Mocha, MD;  Location: [facility] CV LAB;  Service: Cardiovascular;  Laterality: N/A;  . Eye surgery      Family History  Problem Relation Age of Onset  . Heart failure Mother 100  . Heart attack Mother   . Heart attack Brother     Social History Social History  Substance Use Topics  . Smoking status: Former Research scientist (life sciences)  . Smokeless tobacco: Never Used     Comment: QUIT SMOKING  MANY YEARS AGO"  . Alcohol Use: No    Prior to Admission medications   Medication Sig Start Date End Date Taking? Authorizing Provider  amiodarone (PACERONE) 200 MG tablet Take 1 tablet (200 mg total) by mouth 2  (two) times daily with a meal. 09/11/15  Yes Rexene Alberts, MD  amLODipine (NORVASC) 5 MG tablet Take 1 tablet (5 mg total) by mouth daily. 11/17/14  Yes Brett Canales, PA-C  aspirin EC 81 MG tablet Take 81 mg by mouth daily.   Yes Historical Provider, MD  diltiazem (CARDIZEM CD) 120 MG 24 hr capsule Take 1 capsule (120 mg total) by mouth daily. 06/05/15  Yes Eileen Stanford, PA-C  finasteride (PROSCAR) 5 MG tablet TAKE ONE (1) TABLET EACH DAY 09/28/15  Yes [name], MD  fluticasone [facility]) 50 MCG/ACT nasal spray Place 1 spray into both nostrils daily as needed for allergies or rhinitis.   Yes Historical Provider, MD  furosemide (LASIX) 40 MG tablet Take 1.5 tablets (60 mg total) by mouth 2 (two) times daily. Patient taking differently: Take 40-60 mg by mouth 2 (two) times daily. 40 mg in the morning and 60 mg at night 05/08/15  Yes [name], MD  GARLIC PO Take 1 tablet by mouth daily.   Yes Historical Provider, MD  Menthol-Methyl Salicylate (MUSCLE RUB) 10-15 % CREA Apply 1 application topically 2 (two) times daily as needed for muscle pain.    Yes Historical Provider, MD  potassium chloride SA (K-DUR,KLOR-CON) 20 MEQ tablet Take 2 tablets (40 mEq total) by mouth daily. 07/06/15  Yes [name], MD  pravastatin (PRAVACHOL) 40 MG tablet Take 1 tablet (40 mg total) by mouth daily at 6 PM. Patient taking differently: Take 40 mg by mouth daily.  06/05/15  Yes Eileen Stanford, PA-C  tamsulosin (FLOMAX) 0.4 MG CAPS capsule Take 1 capsule (0.4 mg total) by mouth daily. 09/08/15  Yes [name], MD  ticagrelor (BRILINTA) 90 MG TABS tablet Take 1 tablet (90 mg total) by mouth 2 (two) times daily. Patient not taking: Reported on 10/02/2015 11/17/14  Yes Brett Canales, PA-C  traMADol (ULTRAM) 50 MG tablet TAKE ONE (1) TABLET EVERY 6 HOURS 08/04/15  Yes [name], MD    No Known Allergies     Review of Systems:  General:decreased  appetite, decreased energy, no weight gain, no weight loss, no fever Cardiac:no chest pain with exertion, no chest pain at rest, + SOB with exertion, no resting SOB, no PND, no orthopnea, no palpitations, + arrhythmia, no atrial fibrillation, + chronic LE edema, no dizzy spells, no syncope Respiratory:+ exertional shortness of breath, no home oxygen, no productive cough, intermittent dry cough, no bronchitis, no wheezing, no hemoptysis, no asthma, no pain with inspiration or cough, no sleep apnea, no CPAP at night KV:QQVZ difficulty swallowing, no reflux, no frequent heartburn, no hiatal hernia, no abdominal pain, + constipation, no diarrhea, no hematochezia, no hematemesis, no melena  GU:no dysuria, no frequency, no urinary tract infection, no hematuria, no enlarged prostate, no kidney stones, no kidney disease Vascular:no pain suggestive of claudication, no pain in feet, occasional leg cramps, no varicose veins, no DVT, no non-healing foot ulcer Neuro:no stroke, no TIA's, no seizures, no headaches, no temporary blindness one eye, no slurred speech, no peripheral neuropathy, + chronic pain, no instability of gait, no memory/cognitive dysfunction Musculoskeletal:+ arthritis, no joint swelling, no myalgias, no difficulty walking, normal mobility  Skin:no rash, no itching, no skin infections, no pressure sores or ulcerations Psych:no anxiety, no depression, no nervousness, no unusual recent stress Eyes:no blurry vision, no floaters, no recent vision changes, + wears glasses or contacts ENT:+ hearing loss, no loose or  painful teeth, no dentures, last saw dentist several years ago Hematologic:+ easy bruising, no abnormal bleeding, no clotting disorder, no frequent epistaxis Endocrine:+ diabetes, does not check CBG's at home   Physical Exam:  BP 129/71 mmHg  Pulse 95  Resp 20  Ht '5\' 10"'  (1.778 m)  Wt 231 lb (104.781 kg)  BMI 33.15 kg/m2  SpO2 97% General: well-appearing HEENT:Unremarkable  Neck:no JVD, no bruits, no adenopathy  Chest:clear to auscultation, symmetrical breath sounds, no wheezes, no rhonchi  CV:RRR, grade III/VI holosystolic murmur  Abdomen:soft, non-tender, no masses  Extremities:warm, well-perfused, pulses diminished, + 3+ bilateral LE edema Rectal/GUDeferred Neuro:Grossly non-focal and symmetrical throughout Skin:Clean and dry, no rashes, no breakdown   Diagnostic Tests:  Transthoracic Echocardiography  Patient:  [name], [name] MR #:    [id number] Study Date: 11/16/2014 Gender:   M Age:    67 Height:   175.3 cm Weight:   104.3 kg BSA:    2.29 m^2 Pt. Status: Room:    3E28C  ADMITTING  Jenkins Rouge, M.D. ATTENDING  Jenkins Rouge, M.D. ORDERING   Isaiah Serge REFERRING  Cecilie Kicks R SONOGRAPHER Diamond Nickel PERFORMING  [facility], Inpatient  cc:  ------------------------------------------------------------------- LV EF: 55% -  60%  ------------------------------------------------------------------- Indications:   CHF - 428.0. Mitral regurgitation  424.0.  ------------------------------------------------------------------- History:  PMH: Obesity. Pulmonary hypertension. Peripheral edema. Metabolic syndrome. Elevated Troponin. Chronic renal disease. Spinal stenosis. Risk factors: Hypertension. Dyslipidemia.  ------------------------------------------------------------------- Study Conclusions  - Left ventricle: The cavity size was normal. Wall thickness was increased in a pattern of severe LVH. Systolic function was normal. The estimated ejection fraction was in the range of 55% to 60%. - Aortic valve: There was mild regurgitation. - Mitral valve: Similar to 2014 there is a clacified mobile mass on anterior leaflet I think this is a chronic flail segment with at least moderate MR Suggest inpateint TEE. There was moderate regurgitation. - Left atrium: The atrium was moderately dilated. - Atrial septum: No defect or patent foramen ovale was identified. - Pulmonary arteries: PA peak pressure: 57 mm Hg (S).  Transthoracic echocardiography. M-mode, complete 2D, spectral Doppler, and color Doppler. Birthdate: Patient birthdate: 04/10/1929. Age: Patient is 82 yr old. Sex: Gender: male. BMI: 34 kg/m^2. Blood pressure:   128/63 Patient status: Inpatient. Study date: Study date: 11/16/2014. Study time: 09:37 AM. Location: Echo laboratory.  -------------------------------------------------------------------  ------------------------------------------------------------------- Left ventricle: The cavity size was normal. Wall thickness was increased in a pattern of severe LVH. Systolic function was normal. The estimated ejection fraction was in the range of 55% to 60%.  ------------------------------------------------------------------- Aortic valve:  Mildly thickened, moderately calcified leaflets. Doppler: There was mild  regurgitation.  ------------------------------------------------------------------- Mitral valve: Similar to 2014 there is a clacified mobile mass on anterior leaflet I think this  is a chronic flail segment with at least moderate MR Suggest inpateint TEE. Doppler: There was moderate regurgitation.  Peak gradient (D): 4 mm Hg.  ------------------------------------------------------------------- Left atrium: The atrium was moderately dilated.  ------------------------------------------------------------------- Atrial septum: No defect or patent foramen ovale was identified.  ------------------------------------------------------------------- Right ventricle: The cavity size was normal. Wall thickness was normal. Systolic function was normal.  ------------------------------------------------------------------- Pulmonic valve:  Structurally normal valve.  Cusp separation was normal. Doppler: Transvalvular velocity was within the normal range. There was mild regurgitation.  ------------------------------------------------------------------- Tricuspid valve:  Doppler: There was mild regurgitation.  ------------------------------------------------------------------- Right atrium: The atrium was normal in size.  ------------------------------------------------------------------- Pericardium: The pericardium was normal in appearance.  ------------------------------------------------------------------- Measurements  Left ventricle               Value    Reference LV ID, ED, PLAX chordal       (L)   34.5 mm   43 - 52 LV ID, ES, PLAX chordal       (L)   22.6 mm   23 - 38 LV fx shortening, PLAX chordal       34  %   >=29 LV PW thickness, ED             19.2 mm   --------- IVS/LV PW ratio, ED             0.87     <=1.3 LV e&', lateral               6.31 cm/s   --------- LV E/e&', lateral              15.52    --------- LV e&', medial                4.9  cm/s  --------- LV E/e&', medial               19.98    --------- LV e&', average               5.61 cm/s  --------- LV E/e&', average              17.47    ---------  Ventricular septum             Value    Reference IVS thickness, ED              16.7 mm   ---------  LVOT                    Value    Reference LVOT ID, S                 21  mm   --------- LVOT area                  3.46 cm^2  ---------  Aortic valve                Value    Reference Aortic regurg pressure half-time      347  ms   ---------  Aorta                    Value    Reference Aortic root ID, ED             33  mm   ---------  Left atrium                 Value    Reference LA  ID, A-P, ES               48  mm   --------- LA ID/bsa, A-P               2.1  cm/m^2 <=2.2 LA volume, S                136  ml   --------- LA volume/bsa, S              59.4 ml/m^2 --------- LA volume, ES, 1-p A4C           121  ml   --------- LA volume/bsa, ES, 1-p A4C         52.8 ml/m^2 --------- LA volume, ES, 1-p A2C           147  ml   --------- LA volume/bsa, ES, 1-p A2C         64.2 ml/m^2 ---------  Mitral valve                Value    Reference Mitral E-wave peak velocity         97.9 cm/s  --------- Mitral A-wave peak velocity         44.1 cm/s  --------- Mitral deceleration time      (L)   130  ms   150 - 230 Mitral peak gradient, D           4   mm Hg  --------- Mitral E/A ratio, peak           2.2     --------- Mitral maximal regurg velocity,       510  cm/s  --------- PISA Mitral regurg VTI, PISA           152  cm   ---------  Pulmonary arteries             Value    Reference PA pressure, S, DP         (H)   57  mm Hg <=30  Tricuspid valve               Value    Reference Tricuspid regurg peak velocity       324  cm/s  --------- Tricuspid peak RV-RA gradient        42  mm Hg ---------  Systemic veins               Value    Reference Estimated CVP                15  mm Hg ---------  Right ventricle               Value    Reference RV pressure, S, DP         (H)   57  mm Hg <=30 RV s&', lateral, S              13.2 cm/s  ---------  Pulmonic valve               Value    Reference Pulmonic regurg velocity, ED        62.4 cm/s  ---------  Legend: (L) and (H) mark values outside specified reference range.  ------------------------------------------------------------------- Prepared and Electronically Authenticated by  Jenkins Rouge, M.D. 2016-04-13T11:40:18     CARDIAC CATHETERIZATION/PERCUTANEOUS CORONARY INTERVENTION   HISTORY:   [name] is a 82 y.o. male   PROCEDURE: Left heart catheterization: coronary  angiography, left ventriculography, 2 vessel percutaneous coronary intervention with PTCA/DES stenting of the mid LAD and the proximal RCA.  The patient was brought to the [facility] cardiac catherization laboratory in the fasting state. He was premedicated with Versed 2 mg and fentanyl 25 g. His right groin was prepped and shaved in usual sterile fashion. Xylocaine 1% was used for local anesthesia. A 5 French sheath was inserted into the R femoral artery. Diagnostic  catheterizatiion was done with 5 [place] FL5, FR4, and pigtail catheters. Left ventriculography was done with 27 cc Omnipaque contrast. With the demonstration of a 95% focal stenosis in the mid LAD and a 95% proximal RCA stenosis. The decision was made to proceed with two-vessel percutaneous coronary intervention. The arterial sheath was upgraded to a 6 [place] sheath. Angiomax bolus plus infusion was administered. Brilinta 180 mg was administered orally. Attention was initially directed at the LAD. A 6 French XB LAD 4.0 guide was used. A Prowater wire was advanced down the LAD. Dilatation was done with a 2.512 mm Euphora balloon. A Xience Alpine 3.018 mm DES stent was then deployed for 2 inflations at 14 atm. An Whitney Euphora for 3.2512 mm balloon was used for post stent dilatation up to 3.21 mm. Angiography confirmed an excellent angiographic result with the stenosis being reduced to 0%. Attention was then directed at the RCA. A 6 [place] FR4 guide was used for the intervention. The same Prowater and Euphora balloon were used and predilatation was done at 8 and 10 atm. A Xience Alpine 3.015 mm DES stent was deployed at 11 and 12 atm. The same 3.2512 [place] Euphora balloon was used for post stent dilatation at 10 and 11 atm up to 3.18 mm. Angiography confirmed an excellent angiographic result. All catheters were removed and the patient. The arterial sheath was sutured in place. The patient will be maintained on reduced dose bivalirudin for 2 hours post procedure  HEMODYNAMICS:  Central Aorta: 140/63  Left Ventricle: 140/11/24  ANGIOGRAPHY:  Left main: Angiographically normal vessel which bifurcated into the LAD and left circumflex vessel.   LAD: Large caliber vessel that gave rise to a proximal septal and diagonal branch. The LAD after the diagonal vessel had a focal 95% stenosis. The remainder of the LAD was free of significant disease and wrapped around the LV apex.  Left  circumflex: Large caliber normal vessel which gave rise to one major obtuse marginal branch.   Right coronary artery: Large caliber dominant vessel that had a focal 95% proximal stenosis after a moderate sized proximal branch. The vessel ended in the PDA and PLA vessel.   Left ventriculography revealed preserved global LV contractility with an ejection fraction of 55-60%. There appeared to be 2-3+ angiographic mitral regurgitation.  Percutaneous coronary intervention:  The 95% LAD stenosis was reduced to 0% with insertion of a Xience Alpine 3.018 mm DES stent postdilated to 3.21 mm.  The 95% proximal RCA stenosis was reduced to 0% with insertion of a Xience Alpine 3.015 mm DES stent postdilated to 3.18 mm.  IMPRESSION:  Normal LV function with an ejection fraction of 55-60% and evidence for 2-3+ angiographic mitral regurgitation.  Two-vessel coronary obstructive disease with focal 95% mid LAD stenosis and focal proximal 95% RCA stenosis and a large dominant RCA and a normal left circumflex coronary artery.  Successful two-vessel percutaneous coronary intervention to the LAD and RCA with both 95% stenoses being reduced to 0% following insertion of Xience Alpine 3.018 mm DES stent in the  LAD and 3.015 mm DES stent in the RCA.  RECOMMENDATION:  The patient will be maintained on dual antiplatelet therapy for minimum of 1 year. He will be hydrated postprocedure. Aggressive statin therapy. Medical therapy for CAD/hypertension.  Troy Sine, MD, [facility] 11/16/2014 7:39 PM    Transthoracic Echocardiography  Patient:  [name], [name] MR #:    [id number] Study Date: 06/05/2015 Gender:   M Age:    10 Height:   177.8 cm Weight:   104.8 kg BSA:    2.31 m^2 Pt. Status: Room:    2W10C  ATTENDING  Carmin Muskrat [id number] SONOGRAPHER Donata Clay ADMITTING  [place], [place]  [facility], Inpatient ORDERING   Eileen Stanford REFERRING  Angelena Form R  cc:  ------------------------------------------------------------------- LV EF: 75% -  80%  ------------------------------------------------------------------- Indications:   CAD of native vessels 414.01.  ------------------------------------------------------------------- History:  PMH: SVT Lactic acidosis. Moderate MR with a possible mass. PMH:  Myocardial infarction.  ------------------------------------------------------------------- Study Conclusions  - Left ventricle: The cavity size was normal. There was moderate concentric hypertrophy. Systolic function was hyperdynamic. The estimated ejection fraction was in the range of 75% to 80%. Wall motion was normal; there were no regional wall motion abnormalities. Features are consistent with a pseudonormal left ventricular filling pattern, with concomitant abnormal relaxation and increased filling pressure (grade 2 diastolic dysfunction). - Aortic valve: There was mild regurgitation. - Mitral valve: Calcified annulus. Mildly thickened leaflets . Flail motion involving a segment of the anterior leaflet. This causes the appearance of a &quot;mass&quot; on the anterior leaflet. There was severe regurgitation directed eccentrically and posteriorly. Severe regurgitation is suggested by pulmonary vein systolic flow reversal. - Left atrium: The atrium was severely dilated. - Right ventricle: The cavity size was mildly dilated. Wall thickness was normal. - Right atrium: The atrium was moderately to severely dilated. - Tricuspid valve: There was moderate regurgitation. - Pulmonic valve: There was moderate regurgitation. - Pulmonary arteries: Systolic pressure was moderately increased. PA peak pressure: 46 mm Hg (S).  Transthoracic echocardiography. M-mode, complete 2D, spectral Doppler, and color Doppler. Birthdate: Patient birthdate: April 05, 1929. Age:  Patient is 82 yr old. Sex: Gender: male. BMI: 33.1 kg/m^2. Blood pressure:   125/71 Patient status: Inpatient. Study date: Study date: 06/05/2015. Study time: 01:12 PM. Location: Bedside.  -------------------------------------------------------------------  ------------------------------------------------------------------- Left ventricle: The cavity size was normal. There was moderate concentric hypertrophy. Systolic function was hyperdynamic. The estimated ejection fraction was in the range of 75% to 80%. Wall motion was normal; there were no regional wall motion abnormalities. Features are consistent with a pseudonormal left ventricular filling pattern, with concomitant abnormal relaxation and increased filling pressure (grade 2 diastolic dysfunction).  ------------------------------------------------------------------- Aortic valve:  Trileaflet; mildly thickened, mildly calcified leaflets. Sclerosis without stenosis. Doppler: There was mild regurgitation.  ------------------------------------------------------------------- Mitral valve:  Calcified annulus. Mildly thickened leaflets . Flail motion involving a segment of the anterior leaflet. This causes the appearance of a &quot;mass&quot; on the anterior leaflet. Doppler: There was severe regurgitation directed eccentrically and posteriorly. Severe regurgitation is suggested by pulmonary vein systolic flow reversal.  Peak gradient (D): 3 mm Hg.  ------------------------------------------------------------------- Left atrium: The atrium was severely dilated.  ------------------------------------------------------------------- Right ventricle: The cavity size was mildly dilated. Wall thickness was normal. Systolic function was normal.  ------------------------------------------------------------------- Pulmonic valve:  Poorly visualized. Doppler: There was  moderate regurgitation.  ------------------------------------------------------------------- Tricuspid valve:  Structurally normal valve.  Leaflet separation was normal. Doppler: Transvalvular velocity was within the normal range. There was moderate regurgitation.  -------------------------------------------------------------------  Pulmonary artery:  Systolic pressure was moderately increased.  ------------------------------------------------------------------- Right atrium: The atrium was moderately to severely dilated.  ------------------------------------------------------------------- Pericardium: There was no pericardial effusion.  ------------------------------------------------------------------- Measurements  Left ventricle              Value    Reference LV ID, ED, PLAX chordal     (L)   35.9 mm   43 - 52 LV ID, ES, PLAX chordal         25.6 mm   23 - 38 LV fx shortening, PLAX chordal      29  %   >=29 LV PW thickness, ED           17.7 mm   --------- IVS/LV PW ratio, ED           1.04     <=1.3 LV ejection fraction, 1-p A4C      62  %   --------- LV end-diastolic volume, 2-p       184  ml   --------- LV end-systolic volume, 2-p       71  ml   --------- LV ejection fraction, 2-p        62  %   --------- Stroke volume, 2-p            114  ml   --------- LV end-diastolic volume/bsa, 2-p     80  ml/m^2 --------- LV end-systolic volume/bsa, 2-p     31  ml/m^2 --------- Stroke volume/bsa, 2-p          49.4 ml/m^2 --------- LV e&', lateral              4.88 cm/s  --------- LV E/e&', lateral             18.59    --------- LV e&', medial              3.57 cm/s  --------- LV E/e&', medial             25.41    --------- LV e&', average               4.23 cm/s  --------- LV E/e&', average             21.47    ---------  Ventricular septum            Value    Reference IVS thickness, ED            18.4 mm   ---------  LVOT                   Value    Reference LVOT ID, S                20  mm   --------- LVOT area                3.14 cm^2  ---------  Aorta                  Value    Reference Aortic root ID, ED            30  mm   ---------  Left atrium               Value    Reference LA ID, A-P, ES              52  mm   --------- LA ID/bsa, A-P          (  H)   2.25 cm/m^2 <=2.2 LA volume, S               182  ml   --------- LA volume/bsa, S             78.8 ml/m^2 --------- LA volume, ES, 1-p A4C          125  ml   --------- LA volume/bsa, ES, 1-p A4C        54.1 ml/m^2 --------- LA volume, ES, 1-p A2C          218  ml   --------- LA volume/bsa, ES, 1-p A2C        94.4 ml/m^2 ---------  Mitral valve               Value    Reference Mitral E-wave peak velocity       90.7 cm/s  --------- Mitral A-wave peak velocity       44.6 cm/s  --------- Mitral deceleration time     (H)   239  ms   150 - 230 Mitral peak gradient, D         3   mm Hg --------- Mitral E/A ratio, peak          2      ---------  Pulmonary arteries            Value    Reference PA pressure, S, DP        (H)   46  mm Hg <=30  Tricuspid valve             Value    Reference Tricuspid regurg peak velocity      329  cm/s  --------- Tricuspid peak RV-RA gradient      43  mm Hg ---------  Systemic veins               Value    Reference Estimated CVP              3   mm Hg ---------  Right ventricle             Value    Reference TAPSE                  31.7 mm   --------- RV pressure, S, DP        (H)   46  mm Hg <=30 RV s&', lateral, S            11.6 cm/s  ---------  Legend: (L) and (H) mark values outside specified reference range.  ------------------------------------------------------------------- Prepared and Electronically Authenticated by  Sanda Klein, MD 2016-10-31T14:53:56   Transesophageal Echocardiography  Patient:  [name], [name] MR #:    [id number] Study Date: 06/22/2015 Gender:   M Age:    70 Height:   175.3 cm Weight:   106.8 kg BSA:    2.32 m^2 Pt. Status: Room:  SONOGRAPHER Joanie Coddington, [place], MD [place], MD Wallace, MD PERFORMING  Skeet Latch, MD [place], MD  cc:  ------------------------------------------------------------------- LV EF: 60% -  65%  ------------------------------------------------------------------- Indications:   Mitral regurgitation 424.0.  ------------------------------------------------------------------- Study Conclusions  - Left ventricle: Systolic function was normal. The estimated ejection fraction was in the range of 60% to 65%. Wall motion was normal; there were no regional wall motion abnormalities. - Aortic valve: There was mild regurgitation. - Mitral valve: Calcified, ruptured cord  with flail motion of the A3 segment. There was severe regurgitation directed posteriorly. - Left atrium: No evidence of thrombus in the atrial cavity or appendage. No evidence of thrombus in the atrial cavity or appendage. - Right atrium: No evidence of thrombus in the atrial cavity or appendage. -  Atrial septum: The septum bowed from left to right, consistent with increased left atrial pressure.  Diagnostic transesophageal echocardiography. 2D and color Doppler. Birthdate: Patient birthdate: 1928/09/10. Age: Patient is 82 yr old. Sex: Gender: male.  BMI: 34.8 kg/m^2. Blood pressure: 154/73 Patient status: Outpatient. Study date: Study date: 06/22/2015. Study time: 10:51 AM. Location: Endoscopy.  -------------------------------------------------------------------  ------------------------------------------------------------------- Left ventricle: Systolic function was normal. The estimated ejection fraction was in the range of 60% to 65%. Wall motion was normal; there were no regional wall motion abnormalities.  ------------------------------------------------------------------- Aortic valve:  Trileaflet; normal thickness, mildly calcified leaflets. Cusp separation was normal. Doppler: There was mild regurgitation.  ------------------------------------------------------------------- Aorta: There was no atheroma. There was no evidence for dissection. Aortic root: The aortic root was not dilated. Ascending aorta: The ascending aorta was normal in size. Aortic arch: The aortic arch was normal in size. Descending aorta: The descending aorta was normal in size.  ------------------------------------------------------------------- Mitral valve: Leaflet separation was normal. Calcified, ruptured cord with flail motion of the A3 segment. Doppler: There was severe regurgitation directed posteriorly.  ------------------------------------------------------------------- Left atrium: The atrium was normal in size. No evidence of thrombus in the atrial cavity or appendage. No evidence of thrombus in the atrial cavity or appendage. The appendage was morphologically a left appendage, multilobulated, and of normal size. Emptying velocity was  normal.  ------------------------------------------------------------------- Atrial septum: The septum bowed from left to right, consistent with increased left atrial pressure.  ------------------------------------------------------------------- Pulmonary veins: Left upper pulmonary vein: There was systolic flow reversal.  ------------------------------------------------------------------- Right ventricle: The cavity size was normal. Wall thickness was normal. Systolic function was normal.  ------------------------------------------------------------------- Pulmonic valve:  Structurally normal valve.  Doppler: There was trivial regurgitation.  ------------------------------------------------------------------- Tricuspid valve:  Structurally normal valve.  Leaflet separation was normal. Doppler: There was mild regurgitation.  ------------------------------------------------------------------- Pulmonary artery:  The main pulmonary artery was normal-sized.  ------------------------------------------------------------------- Right atrium: The atrium was normal in size. No evidence of thrombus in the atrial cavity or appendage. The appendage was morphologically a right appendage.  ------------------------------------------------------------------- Pericardium: There was no pericardial effusion.  ------------------------------------------------------------------- Measurements  Mitral valve                Value Mitral maximal regurg velocity, PISA    590  cm/s Mitral regurg VTI, PISA          152  cm Mitral ERO, PISA              1.44 cm^2 Mitral regurg volume, PISA         219  ml  Tricuspid valve              Value Tricuspid regurg peak velocity       202  cm/s Tricuspid peak RV-RA gradient       16  mm Hg  Legend: (L) and (H) mark values outside specified reference  range.  ------------------------------------------------------------------- Prepared and Electronically Authenticated by  Skeet Latch, MD 2016-11-17T17:40:04       CARDIAC CATHETERIZATION Procedures    Right/Left Heart Cath and Coronary Angiography    Conclusion     Dist LAD lesion, 30% stenosed.  Mid RCA-2 lesion, 25% stenosed.  Mid Cx lesion, 30% stenosed.  1. Continued stent patency in the RCA and LAD 2. Mild diffuse CAD as outlined 3. Hemodynamics consistent with severe mitral regurgitation (large V-waves in PCWP tracing, mild pulmonary HTN)     Indications    Severe mitral regurgitation [I34.0 (ICD-10-CM)]    Technique and Indications    INDICATION: Severe mitral regurgitation, known CAD s/p LAD and RCA stenting in April 2016, preoperative study  PROCEDURAL DETAILS: The left groin was prepped, draped, and anesthetized with 1% lidocaine. Using the modified Seldinger technique a 4 French sheath was placed in the right femoral artery and a 7 French sheath was placed in the right femoral vein. A Swan-Ganz catheter was used for the right heart catheterization. Standard protocol was followed for recording of right heart pressures and sampling of oxygen saturations. Fick cardiac output was calculated. Standard Judkins catheters were used for selective coronary angiography. There were no immediate procedural complications. The patient was transferred to the post catheterization recovery area for further monitoring.  Estimated blood loss <50 mL. There were no immediate complications during the procedure.    Coronary Findings    Dominance: Right   Left Anterior Descending   . Mid LAD lesion, 0% stenosed. Previously placed Mid LAD drug eluting stent is patent. Widely patent mid-LAD stent   . Dist LAD lesion, 30% stenosed. There is diffuse irregularity and mild stenosis of the mid and distal LAD     Left Circumflex   . Mid  Cx lesion, 30% stenosed.     Right Coronary Artery   . Mid RCA-1 lesion, 0% stenosed. Previously placed Mid RCA-1 drug eluting stent is patent. Patent RCA stent   . Mid RCA-2 lesion, 25% stenosed. Mild diffuse irregularity of the mid/distal RCA       Right Heart Pressures Hemodynamic findings consistent with mild pulmonary hypertension. Elevated LV EDP consistent with volume overload.    Coronary Diagrams    Diagnostic Diagram            Implants    Name ID Temporary Type Supply   No information to display    PACS Images    Show images for Cardiac catheterization     Link to Procedure Log    Procedure Log      Hemo Data       Most Recent Value   Fick Cardiac Output  6.09 L/min   Fick Cardiac Output Index  2.79 (L/min)/BSA   RA A Wave  9 mmHg   RA V Wave  6 mmHg   RA Mean  6 mmHg   RV Systolic Pressure  45 mmHg   RV Diastolic Pressure  6 mmHg   RV EDP  8 mmHg   PA Systolic Pressure  46 mmHg   PA Diastolic Pressure  13 mmHg   PA Mean  27 mmHg   PW A Wave  22 mmHg   PW V Wave  31 mmHg   PW Mean  20 mmHg   AO Systolic Pressure  595 mmHg   AO Diastolic Pressure  59 mmHg   AO Mean  84 mmHg   LV Systolic Pressure  638 mmHg   LV Diastolic Pressure  10 mmHg   LV EDP  20 mmHg   Arterial Occlusion Pressure Extended Systolic Pressure  756 mmHg   Arterial Occlusion Pressure Extended Diastolic Pressure  62 mmHg   Arterial Occlusion Pressure Extended Mean Pressure  90 mmHg   Left Ventricular Apex Extended Systolic Pressure  433 mmHg   Left Ventricular  Apex Extended Diastolic Pressure  8 mmHg   Left Ventricular Apex Extended EDP Pressure  22 mmHg   QP/QS  1   TPVR Index  9.66 HRUI   TSVR Index  30.06 HRUI   PVR SVR Ratio  0.09   TPVR/TSVR Ratio  0.32                    Impression:  Patient has stage D severe symptomatic primary mitral regurgitation. He presents with chronic symptoms of fatigue, exertional shortness of breath and bilateral lower extremity edema. He appears to be stable although somewhat volume overloaded on exam. Transesophageal echocardiogram confirmed the presence of multiple ruptured primary chordae tendineae with a flail segment of the anterior leaflet and severe mitral regurgitation. I have personally reviewed the patient's recent diagnostic cardiac catheterization which reveals continued patency of the stents placed in the LAD and RCA last spring. The patient has mild pulmonary hypertension. Although risks of surgery will be somewhat high because of the patient's advanced age and mildly limited physical mobility, I agree that he would probably best be treated with mitral valve repair. He appears to be an acceptable candidate for minimally invasive approach. Concomitant Maze procedure or clipping of the left atrial appendage could be considered because of the patient's history of SVT.  Plan:  The patient and his wife were again counseled at length regarding the indications, risks and potential benefits of mitral valve repair. The rationale for elective surgery has been explained, including a comparison between surgery and continued medical therapy with close follow-up. The likelihood of successful and durable valve repair has been discussed with particular reference to the findings of their recent echocardiogram. Based upon these findings and previous experience, I have quoted them a greater than 80 percent likelihood of successful valve repair. In the unlikely event that their valve cannot be successfully repaired, we discussed the possibility of replacing the mitral valve using a mechanical prosthesis with the attendant need for long-term anticoagulation versus the alternative of replacing it using a bioprosthetic tissue  valve with its potential for late structural valve deterioration and failure, depending upon the patient's longevity. The patient specifically requests that if the mitral valve must be replaced that it be done using a bioprosthetic tissue valve. The patient understands and accepts all potential risks of surgery including but not limited to risk of death, stroke or other neurologic complication, myocardial infarction, congestive heart failure, respiratory failure, renal failure, bleeding requiring transfusion and/or reexploration, arrhythmia, infection or other wound complications, pneumonia, pleural and/or pericardial effusion, pulmonary embolus, aortic dissection or other major vascular complication, or delayed complications related to valve repair or replacement including but not limited to structural valve deterioration and failure, thrombosis, embolization, endocarditis, or paravalvular leak. Alternative surgical approaches have been discussed including a comparison between conventional sternotomy and minimally-invasive techniques. The relative risks and benefits of each have been reviewed as they pertain to the patient's specific circumstances, and all of their questions have been addressed. Specific risks potentially related to the minimally-invasive approach were discussed at length, including but not limited to risk of conversion to full or partial sternotomy, aortic dissection or other major vascular complication, unilateral acute lung injury or pulmonary edema, phrenic nerve dysfunction or paralysis, rib fracture, chronic pain, lung hernia, or lymphocele. All of their questions have been answered.  In addition to other potential risks and complications from the surgery, I have made the patient aware of the recent [place] concerning the risk of infection  by Loyal Gambler related to the use of [employer]ert 3T heater-cooler equipment during cardiac surgery. I discussed with the  patient the low risk of infection, as well as our compliance with the most current FDA recommendations to minimize infection and testing of all devices for contamination. The patient has been made aware of the limited alternatives to immediately replacing the current equipment. The patient has been informed regarding the risks associated with waiting to proceed with needed surgery and that such risks are greater than the risk of infection related to the use of the heater-cooler device. I did make the patient aware that after careful review of the patients having cardiac surgery at [facility] we have no evidence that heater/cooler related infections have occurred at [facility]. We discussed that this is a slow-growing bacterium, such that it can take some period of time for symptoms to develop.  I have again reviewed the indications, risks, and potential benefits of surgery with the patient and his wife in the office today. All of their questions have been addressed. We plan to proceed with surgery on Wednesday, 10/04/2015 as previously scheduled. The patient apparently never filled his prescription for amiodarone which have been ordered to begin prior to surgery an effort to decrease his risk of perioperative dysrhythmias. At this point I do not feel that we should postpone surgery and amiodarone can be started postoperatively as needed. The patient did stop taking Brilinta last week as he had been instructed.    Valentina Gu. Roxy Manns, MD 10/02/2015 5:01 PM

## 2015-10-03 NOTE — Progress Notes (Addendum)
Anesthesia chart review: Patient is an 82 year old male scheduled for minimally invasive mitral valve repair or replacement on 10/04/2015 by Dr. Roxy Manns. By notes, "Concommitment Maze procedure or clipping of the left atrial appendage could be considered because of the patient's history of SVT." (Update: Per Dr. Roxy Manns, he is not planning to do MAZE procedure.)  History includes MVP with severe mitral regurgitation, CAD/NSTEMI 11/2014 s/p DES LAD and RCA (admitted in acute diastolic CHF 0000000 with elevated troponin; had abnormal stress test leading to cath), SVT (last admitted 05/2015 s/p DCCV after failed adenosine), pulmonary hypertension, chronic diastolic CHF, SOB, HTN, HLD, chronic fatigue, gout, edema, ED, prostate cancer, (suspected) liver hemangioma 07/2015 CTA, nephrolithiasis, former smoker, C4-5 ACDF '04, back surgery.   PCP is Dr. [name].  Cardiologist is Dr. Percival Spanish. EP cardiologist is Dr. Curt Bears. He saw patient on 06/13/15. A trial of Cardizem with continued use of vagal maneuvers for SVT recommended. If no improvement in three months then would consider ablation.   Meds include amiodarone, amlodipine, aspirin, diltiazem, finasteride, Flonase, furosemide, KCl, pravastatin, Flomax, Brilinta (on hold for 7 days pre-op), tramadol.  10/02/15 EKG: NSR, possible LAE, possible inferior infarct (age undetermined), cannot rule out anterior infarct (age undetermined).  08/08/15 RHC/LHC:  Dist LAD lesion, 30% stenosed.  Mid RCA-2 lesion, 25% stenosed.  Mid Cx lesion, 30% stenosed. 1. Continued stent patency in the RCA and LAD 2. Mild diffuse CAD as outlined 3. Hemodynamics consistent with severe mitral regurgitation (large V-waves in PCWP tracing, mild pulmonary HTN)  06/22/15 TEE: Study Conclusions - Left ventricle: Systolic function was normal. The estimated ejection fraction was in the range of 60% to 65%. Wall motion was normal; there were no regional wall motion  abnormalities. - Aortic valve: There was mild regurgitation. 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 There was severe regurgitation directed posteriorly. - Left atrium: No evidence of thrombus in the atrial cavity or appendage. No evidence of thrombus in the atrial cavity or appendage. - Right atrium: No evidence of thrombus in the atrial cavity or appendage. - Atrial septum: The septum bowed from left to right, consistent with increased left atrial pressure.  10/02/15 Carotid U/S (preliminary): No evidence of stenosis in bilateral carotid arteries. Vertebral arteries demonstrate antegrade flow.   08/02/15 CTA chest/abd/pelvis: IMPRESSION: 1. There is no evidence of thoracic or abdominal aortic aneurysm or dissection. 2. Coronary artery calcifications are noted suggesting coronary artery disease. 3. Stable mild prostatic hypertrophy is noted. 4. Stable right-sided bladder diverticulum is noted. 5. 12 mm ill-defined enhancing structure seen in posterior segment of right hepatic lobe which may simply represent hemangioma, but is not diagnostic for this based on CT criteria. MRI of the liver with and without gadolinium administration is recommended for further evaluation.  10/02/15 CXR: IMPRESSION: Enlargement of cardiac silhouette. No acute abnormalities.  10/02/15 PFTs: FVC 2.40 (68%), FEV1 1.78 (73%), DLCOunc 16.49 (55%).  Preoperative labs noted. K 3.0. Cr 1.18. H/H 12.5/37.8. PT/INR 16.7/1.34. PTT 31. A1c 6.8. Glucose 235. He will get a fasting CBG on arrival.   If no acute changes then I anticipate that he can proceed as planned.  [facility] Short Stay Center/Anesthesiology Phone [phone] 10/03/2015 11:06 AM

## 2015-10-04 ENCOUNTER — Encounter (HOSPITAL_COMMUNITY): Payer: Self-pay | Admitting: *Deleted

## 2015-10-04 ENCOUNTER — Inpatient Hospital Stay (HOSPITAL_COMMUNITY): Payer: Medicare Other | Admitting: Anesthesiology

## 2015-10-04 ENCOUNTER — Inpatient Hospital Stay (HOSPITAL_COMMUNITY)
Admission: RE | Admit: 2015-10-04 | Discharge: 2015-10-13 | DRG: 219 | Disposition: A | Discharge status: Home Health | Payer: Medicare Other | Source: Ambulatory Visit | Attending: Thoracic Surgery (Cardiothoracic Vascular Surgery) | Admitting: Thoracic Surgery (Cardiothoracic Vascular Surgery)

## 2015-10-04 ENCOUNTER — Inpatient Hospital Stay (HOSPITAL_COMMUNITY): Payer: Medicare Other

## 2015-10-04 ENCOUNTER — Encounter (HOSPITAL_COMMUNITY)
Admission: RE | Disposition: A | Discharge status: Home Health | Payer: Self-pay | Source: Ambulatory Visit | Attending: Thoracic Surgery (Cardiothoracic Vascular Surgery)

## 2015-10-04 DIAGNOSIS — I471 Supraventricular tachycardia: Secondary | ICD-10-CM | POA: Diagnosis present

## 2015-10-04 DIAGNOSIS — I9789 Other postprocedural complications and disorders of the circulatory system, not elsewhere classified: Secondary | ICD-10-CM | POA: Diagnosis present

## 2015-10-04 DIAGNOSIS — Z7982 Long term (current) use of aspirin: Secondary | ICD-10-CM

## 2015-10-04 DIAGNOSIS — I252 Old myocardial infarction: Secondary | ICD-10-CM

## 2015-10-04 DIAGNOSIS — Z955 Presence of coronary angioplasty implant and graft: Secondary | ICD-10-CM

## 2015-10-04 DIAGNOSIS — Z96619 Presence of unspecified artificial shoulder joint: Secondary | ICD-10-CM | POA: Diagnosis present

## 2015-10-04 DIAGNOSIS — I081 Rheumatic disorders of both mitral and tricuspid valves: Principal | ICD-10-CM | POA: Diagnosis present

## 2015-10-04 DIAGNOSIS — I130 Hypertensive heart and chronic kidney disease with heart failure and stage 1 through stage 4 chronic kidney disease, or unspecified chronic kidney disease: Secondary | ICD-10-CM | POA: Diagnosis present

## 2015-10-04 DIAGNOSIS — I2510 Atherosclerotic heart disease of native coronary artery without angina pectoris: Secondary | ICD-10-CM | POA: Diagnosis present

## 2015-10-04 DIAGNOSIS — Z4682 Encounter for fitting and adjustment of non-vascular catheter: Secondary | ICD-10-CM | POA: Diagnosis not present

## 2015-10-04 DIAGNOSIS — I340 Nonrheumatic mitral (valve) insufficiency: Secondary | ICD-10-CM | POA: Diagnosis not present

## 2015-10-04 DIAGNOSIS — M109 Gout, unspecified: Secondary | ICD-10-CM | POA: Diagnosis present

## 2015-10-04 DIAGNOSIS — D696 Thrombocytopenia, unspecified: Secondary | ICD-10-CM | POA: Diagnosis not present

## 2015-10-04 DIAGNOSIS — I272 Other secondary pulmonary hypertension: Secondary | ICD-10-CM | POA: Diagnosis present

## 2015-10-04 DIAGNOSIS — R5383 Other fatigue: Secondary | ICD-10-CM | POA: Diagnosis present

## 2015-10-04 DIAGNOSIS — E785 Hyperlipidemia, unspecified: Secondary | ICD-10-CM | POA: Diagnosis present

## 2015-10-04 DIAGNOSIS — R5381 Other malaise: Secondary | ICD-10-CM | POA: Diagnosis present

## 2015-10-04 DIAGNOSIS — Z9861 Coronary angioplasty status: Secondary | ICD-10-CM

## 2015-10-04 DIAGNOSIS — Z6835 Body mass index (BMI) 35.0-35.9, adult: Secondary | ICD-10-CM | POA: Diagnosis not present

## 2015-10-04 DIAGNOSIS — I43 Cardiomyopathy in diseases classified elsewhere: Secondary | ICD-10-CM | POA: Diagnosis present

## 2015-10-04 DIAGNOSIS — J9811 Atelectasis: Secondary | ICD-10-CM

## 2015-10-04 DIAGNOSIS — Z87820 Personal history of traumatic brain injury: Secondary | ICD-10-CM

## 2015-10-04 DIAGNOSIS — I4891 Unspecified atrial fibrillation: Secondary | ICD-10-CM | POA: Diagnosis present

## 2015-10-04 DIAGNOSIS — I952 Hypotension due to drugs: Secondary | ICD-10-CM | POA: Diagnosis not present

## 2015-10-04 DIAGNOSIS — E669 Obesity, unspecified: Secondary | ICD-10-CM | POA: Diagnosis present

## 2015-10-04 DIAGNOSIS — M1990 Unspecified osteoarthritis, unspecified site: Secondary | ICD-10-CM | POA: Diagnosis present

## 2015-10-04 DIAGNOSIS — I38 Endocarditis, valve unspecified: Secondary | ICD-10-CM | POA: Diagnosis not present

## 2015-10-04 DIAGNOSIS — I97190 Other postprocedural cardiac functional disturbances following cardiac surgery: Secondary | ICD-10-CM | POA: Diagnosis not present

## 2015-10-04 DIAGNOSIS — I442 Atrioventricular block, complete: Secondary | ICD-10-CM | POA: Diagnosis not present

## 2015-10-04 DIAGNOSIS — I511 Rupture of chordae tendineae, not elsewhere classified: Secondary | ICD-10-CM | POA: Diagnosis not present

## 2015-10-04 DIAGNOSIS — Z9889 Other specified postprocedural states: Secondary | ICD-10-CM

## 2015-10-04 DIAGNOSIS — D62 Acute posthemorrhagic anemia: Secondary | ICD-10-CM | POA: Diagnosis not present

## 2015-10-04 DIAGNOSIS — I5032 Chronic diastolic (congestive) heart failure: Secondary | ICD-10-CM | POA: Diagnosis present

## 2015-10-04 DIAGNOSIS — R600 Localized edema: Secondary | ICD-10-CM | POA: Diagnosis present

## 2015-10-04 DIAGNOSIS — E1122 Type 2 diabetes mellitus with diabetic chronic kidney disease: Secondary | ICD-10-CM | POA: Diagnosis present

## 2015-10-04 DIAGNOSIS — Z87891 Personal history of nicotine dependence: Secondary | ICD-10-CM | POA: Diagnosis not present

## 2015-10-04 DIAGNOSIS — G8929 Other chronic pain: Secondary | ICD-10-CM | POA: Diagnosis present

## 2015-10-04 DIAGNOSIS — I509 Heart failure, unspecified: Secondary | ICD-10-CM | POA: Diagnosis not present

## 2015-10-04 DIAGNOSIS — Z8546 Personal history of malignant neoplasm of prostate: Secondary | ICD-10-CM

## 2015-10-04 DIAGNOSIS — R296 Repeated falls: Secondary | ICD-10-CM

## 2015-10-04 DIAGNOSIS — J90 Pleural effusion, not elsewhere classified: Secondary | ICD-10-CM | POA: Diagnosis not present

## 2015-10-04 DIAGNOSIS — R918 Other nonspecific abnormal finding of lung field: Secondary | ICD-10-CM | POA: Diagnosis not present

## 2015-10-04 DIAGNOSIS — M549 Dorsalgia, unspecified: Secondary | ICD-10-CM | POA: Diagnosis present

## 2015-10-04 DIAGNOSIS — Z8679 Personal history of other diseases of the circulatory system: Secondary | ICD-10-CM

## 2015-10-04 DIAGNOSIS — R5382 Chronic fatigue, unspecified: Secondary | ICD-10-CM | POA: Diagnosis present

## 2015-10-04 DIAGNOSIS — I349 Nonrheumatic mitral valve disorder, unspecified: Secondary | ICD-10-CM | POA: Diagnosis not present

## 2015-10-04 DIAGNOSIS — T82897A Other specified complication of cardiac prosthetic devices, implants and grafts, initial encounter: Secondary | ICD-10-CM | POA: Diagnosis not present

## 2015-10-04 DIAGNOSIS — N183 Chronic kidney disease, stage 3 (moderate): Secondary | ICD-10-CM | POA: Diagnosis present

## 2015-10-04 DIAGNOSIS — E119 Type 2 diabetes mellitus without complications: Secondary | ICD-10-CM | POA: Diagnosis present

## 2015-10-04 DIAGNOSIS — Z79899 Other long term (current) drug therapy: Secondary | ICD-10-CM

## 2015-10-04 DIAGNOSIS — Z452 Encounter for adjustment and management of vascular access device: Secondary | ICD-10-CM | POA: Diagnosis not present

## 2015-10-04 DIAGNOSIS — I110 Hypertensive heart disease with heart failure: Secondary | ICD-10-CM

## 2015-10-04 DIAGNOSIS — I10 Essential (primary) hypertension: Secondary | ICD-10-CM | POA: Diagnosis present

## 2015-10-04 HISTORY — PX: MITRAL VALVE REPAIR: SHX2039

## 2015-10-04 HISTORY — PX: TEE WITHOUT CARDIOVERSION: SHX5443

## 2015-10-04 LAB — POCT I-STAT, CHEM 8
BUN: 18 mg/dL (ref 6–20)
BUN: 15 mg/dL (ref 6–20)
BUN: 16 mg/dL (ref 6–20)
BUN: 15 mg/dL (ref 6–20)
BUN: 16 mg/dL (ref 6–20)
BUN: 15 mg/dL (ref 6–20)
BUN: 14 mg/dL (ref 6–20)
BUN: 13 mg/dL (ref 6–20)
CALCIUM ION: 1.13 mmol/L (ref 1.13–1.30)
CALCIUM ION: 1.35 mmol/L — AB (ref 1.13–1.30)
CALCIUM ION: 1.25 mmol/L (ref 1.13–1.30)
CHLORIDE: 97 mmol/L — AB (ref 101–111)
CHLORIDE: 102 mmol/L (ref 101–111)
CREATININE: 1.1 mg/dL (ref 0.61–1.24)
CREATININE: 0.9 mg/dL (ref 0.61–1.24)
CREATININE: 1.1 mg/dL (ref 0.61–1.24)
Calcium, Ion: 1.17 mmol/L (ref 1.13–1.30)
Calcium, Ion: 1.04 mmol/L — ABNORMAL LOW (ref 1.13–1.30)
Calcium, Ion: 1.05 mmol/L — ABNORMAL LOW (ref 1.13–1.30)
Calcium, Ion: 1.02 mmol/L — ABNORMAL LOW (ref 1.13–1.30)
Calcium, Ion: 1.38 mmol/L — ABNORMAL HIGH (ref 1.13–1.30)
Chloride: 95 mmol/L — ABNORMAL LOW (ref 101–111)
Chloride: 95 mmol/L — ABNORMAL LOW (ref 101–111)
Chloride: 98 mmol/L — ABNORMAL LOW (ref 101–111)
Chloride: 99 mmol/L — ABNORMAL LOW (ref 101–111)
Chloride: 99 mmol/L — ABNORMAL LOW (ref 101–111)
Chloride: 105 mmol/L (ref 101–111)
Creatinine, Ser: 1.1 mg/dL (ref 0.61–1.24)
Creatinine, Ser: 0.9 mg/dL (ref 0.61–1.24)
Creatinine, Ser: 0.9 mg/dL (ref 0.61–1.24)
Creatinine, Ser: 1 mg/dL (ref 0.61–1.24)
Creatinine, Ser: 1 mg/dL (ref 0.61–1.24)
GLUCOSE: 138 mg/dL — AB (ref 65–99)
GLUCOSE: 135 mg/dL — AB (ref 65–99)
Glucose, Bld: 140 mg/dL — ABNORMAL HIGH (ref 65–99)
Glucose, Bld: 150 mg/dL — ABNORMAL HIGH (ref 65–99)
Glucose, Bld: 153 mg/dL — ABNORMAL HIGH (ref 65–99)
Glucose, Bld: 142 mg/dL — ABNORMAL HIGH (ref 65–99)
Glucose, Bld: 197 mg/dL — ABNORMAL HIGH (ref 65–99)
Glucose, Bld: 116 mg/dL — ABNORMAL HIGH (ref 65–99)
HCT: 38 % — ABNORMAL LOW (ref 39.0–52.0)
HCT: 31 % — ABNORMAL LOW (ref 39.0–52.0)
HEMATOCRIT: 33 % — AB (ref 39.0–52.0)
HEMATOCRIT: 36 % — AB (ref 39.0–52.0)
HEMATOCRIT: 30 % — AB (ref 39.0–52.0)
HEMATOCRIT: 30 % — AB (ref 39.0–52.0)
HEMATOCRIT: 31 % — AB (ref 39.0–52.0)
HEMATOCRIT: 26 % — AB (ref 39.0–52.0)
HEMOGLOBIN: 11.2 g/dL — AB (ref 13.0–17.0)
HEMOGLOBIN: 12.2 g/dL — AB (ref 13.0–17.0)
HEMOGLOBIN: 10.2 g/dL — AB (ref 13.0–17.0)
HEMOGLOBIN: 8.8 g/dL — AB (ref 13.0–17.0)
Hemoglobin: 12.9 g/dL — ABNORMAL LOW (ref 13.0–17.0)
Hemoglobin: 10.2 g/dL — ABNORMAL LOW (ref 13.0–17.0)
Hemoglobin: 10.5 g/dL — ABNORMAL LOW (ref 13.0–17.0)
Hemoglobin: 10.5 g/dL — ABNORMAL LOW (ref 13.0–17.0)
POTASSIUM: 3.3 mmol/L — AB (ref 3.5–5.1)
POTASSIUM: 3.3 mmol/L — AB (ref 3.5–5.1)
POTASSIUM: 3.1 mmol/L — AB (ref 3.5–5.1)
Potassium: 3.1 mmol/L — ABNORMAL LOW (ref 3.5–5.1)
Potassium: 2.7 mmol/L — CL (ref 3.5–5.1)
Potassium: 3.3 mmol/L — ABNORMAL LOW (ref 3.5–5.1)
Potassium: 4 mmol/L (ref 3.5–5.1)
Potassium: 3.7 mmol/L (ref 3.5–5.1)
SODIUM: 137 mmol/L (ref 135–145)
SODIUM: 137 mmol/L (ref 135–145)
SODIUM: 139 mmol/L (ref 135–145)
SODIUM: 138 mmol/L (ref 135–145)
SODIUM: 137 mmol/L (ref 135–145)
SODIUM: 141 mmol/L (ref 135–145)
SODIUM: 140 mmol/L (ref 135–145)
Sodium: 139 mmol/L (ref 135–145)
TCO2: 34 mmol/L (ref 0–100)
TCO2: 29 mmol/L (ref 0–100)
TCO2: 33 mmol/L (ref 0–100)
TCO2: 34 mmol/L (ref 0–100)
TCO2: 32 mmol/L (ref 0–100)
TCO2: 26 mmol/L (ref 0–100)
TCO2: 27 mmol/L (ref 0–100)
TCO2: 24 mmol/L (ref 0–100)

## 2015-10-04 LAB — POCT I-STAT 4, (NA,K, GLUC, HGB,HCT)
GLUCOSE: 127 mg/dL — AB (ref 65–99)
HCT: 32 % — ABNORMAL LOW (ref 39.0–52.0)
Hemoglobin: 10.9 g/dL — ABNORMAL LOW (ref 13.0–17.0)
POTASSIUM: 3.2 mmol/L — AB (ref 3.5–5.1)
Sodium: 142 mmol/L (ref 135–145)

## 2015-10-04 LAB — GRAM STAIN

## 2015-10-04 LAB — PLATELET COUNT: PLATELETS: 98 10*3/uL — AB (ref 150–400)

## 2015-10-04 LAB — POCT I-STAT 3, ART BLOOD GAS (G3+)
ACID-BASE EXCESS: 2 mmol/L (ref 0.0–2.0)
Acid-Base Excess: 6 mmol/L — ABNORMAL HIGH (ref 0.0–2.0)
Acid-Base Excess: 2 mmol/L (ref 0.0–2.0)
Bicarbonate: 28.8 mEq/L — ABNORMAL HIGH (ref 20.0–24.0)
Bicarbonate: 30.9 mEq/L — ABNORMAL HIGH (ref 20.0–24.0)
Bicarbonate: 26.8 mEq/L — ABNORMAL HIGH (ref 20.0–24.0)
O2 SAT: 100 %
O2 Saturation: 97 %
O2 Saturation: 99 %
PCO2 ART: 44.3 mmHg (ref 35.0–45.0)
PCO2 ART: 40.3 mmHg (ref 35.0–45.0)
PH ART: 7.451 — AB (ref 7.350–7.450)
PH ART: 7.431 (ref 7.350–7.450)
PO2 ART: 439 mmHg — AB (ref 80.0–100.0)
PO2 ART: 95 mmHg (ref 80.0–100.0)
TCO2: 30 mmol/L (ref 0–100)
TCO2: 32 mmol/L (ref 0–100)
TCO2: 28 mmol/L (ref 0–100)
pCO2 arterial: 54.2 mmHg — ABNORMAL HIGH (ref 35.0–45.0)
pH, Arterial: 7.334 — ABNORMAL LOW (ref 7.350–7.450)
pO2, Arterial: 145 mmHg — ABNORMAL HIGH (ref 80.0–100.0)

## 2015-10-04 LAB — PROTIME-INR
INR: 1.76 — AB (ref 0.00–1.49)
Prothrombin Time: 20.5 seconds — ABNORMAL HIGH (ref 11.6–15.2)

## 2015-10-04 LAB — CBC
HEMATOCRIT: 32.8 % — AB (ref 39.0–52.0)
HEMOGLOBIN: 10.8 g/dL — AB (ref 13.0–17.0)
MCH: 30.4 pg (ref 26.0–34.0)
MCHC: 32.9 g/dL (ref 30.0–36.0)
MCV: 92.4 fL (ref 78.0–100.0)
Platelets: 128 10*3/uL — ABNORMAL LOW (ref 150–400)
RBC: 3.55 MIL/uL — ABNORMAL LOW (ref 4.22–5.81)
RDW: 12.8 % (ref 11.5–15.5)
WBC: 17.7 10*3/uL — ABNORMAL HIGH (ref 4.0–10.5)

## 2015-10-04 LAB — GLUCOSE, CAPILLARY
GLUCOSE-CAPILLARY: 126 mg/dL — AB (ref 65–99)
GLUCOSE-CAPILLARY: 124 mg/dL — AB (ref 65–99)
Glucose-Capillary: 143 mg/dL — ABNORMAL HIGH (ref 65–99)
Glucose-Capillary: 139 mg/dL — ABNORMAL HIGH (ref 65–99)
Glucose-Capillary: 88 mg/dL (ref 65–99)
Glucose-Capillary: 111 mg/dL — ABNORMAL HIGH (ref 65–99)

## 2015-10-04 LAB — HEMOGLOBIN AND HEMATOCRIT, BLOOD
HCT: 29.1 % — ABNORMAL LOW (ref 39.0–52.0)
HEMOGLOBIN: 10 g/dL — AB (ref 13.0–17.0)

## 2015-10-04 LAB — CREATININE, SERUM
CREATININE: 1.19 mg/dL (ref 0.61–1.24)
GFR, EST NON AFRICAN AMERICAN: 54 mL/min — AB (ref >60)

## 2015-10-04 LAB — APTT: aPTT: 34 seconds (ref 24–37)

## 2015-10-04 LAB — MAGNESIUM: MAGNESIUM: 3 mg/dL — AB (ref 1.7–2.4)

## 2015-10-04 SURGERY — REPAIR, MITRAL VALVE, MINIMALLY INVASIVE
Anesthesia: General | Site: Chest | Laterality: Right

## 2015-10-04 MED ORDER — SODIUM CHLORIDE 0.9% FLUSH
3.0000 mL | Freq: Two times a day (BID) | INTRAVENOUS | Status: DC
  Administered 2015-10-05: 3 mL via INTRAVENOUS

## 2015-10-04 MED ORDER — PROPOFOL 10 MG/ML IV BOLUS
INTRAVENOUS | Status: DC | PRN
  Administered 2015-10-04: 30 mg via INTRAVENOUS

## 2015-10-04 MED ORDER — SODIUM CHLORIDE 0.9 % IV SOLN
INTRAVENOUS | Status: DC
  Administered 2015-10-04: 20:00:00 via INTRAVENOUS
  Filled 2015-10-04: qty 2.5

## 2015-10-04 MED ORDER — PHENYLEPHRINE HCL 10 MG/ML IJ SOLN
INTRAMUSCULAR | Status: DC | PRN
  Administered 2015-10-04 (×3): 80 ug via INTRAVENOUS
  Administered 2015-10-04: 120 ug via INTRAVENOUS
  Administered 2015-10-04: 160 ug via INTRAVENOUS

## 2015-10-04 MED ORDER — POTASSIUM CHLORIDE 10 MEQ/50ML IV SOLN
10.0000 meq | INTRAVENOUS | Status: AC | PRN
  Administered 2015-10-05 (×3): 10 meq via INTRAVENOUS
  Filled 2015-10-04: qty 50

## 2015-10-04 MED ORDER — ALBUMIN HUMAN 5 % IV SOLN
INTRAVENOUS | Status: DC | PRN
  Administered 2015-10-04 (×4): via INTRAVENOUS

## 2015-10-04 MED ORDER — HEMOSTATIC AGENTS (NO CHARGE) OPTIME
TOPICAL | Status: DC | PRN
  Administered 2015-10-04: 1 via TOPICAL

## 2015-10-04 MED ORDER — NITROGLYCERIN IN D5W 200-5 MCG/ML-% IV SOLN: 0.0000 ug/min | INTRAVENOUS | Status: DC

## 2015-10-04 MED ORDER — BISACODYL 5 MG PO TBEC: 10.0000 mg | DELAYED_RELEASE_TABLET | Freq: Every day | ORAL | Status: DC

## 2015-10-04 MED ORDER — MORPHINE SULFATE (PF) 2 MG/ML IV SOLN
1.0000 mg | INTRAVENOUS | Status: DC | PRN
  Administered 2015-10-05 (×2): 2 mg via INTRAVENOUS
  Filled 2015-10-04: qty 2

## 2015-10-04 MED ORDER — SODIUM CHLORIDE 0.9 % IJ SOLN
OROMUCOSAL | Status: DC | PRN
  Administered 2015-10-04 (×3): 4 mL via TOPICAL

## 2015-10-04 MED ORDER — PHENYLEPHRINE 40 MCG/ML (10ML) SYRINGE FOR IV PUSH (FOR BLOOD PRESSURE SUPPORT)
PREFILLED_SYRINGE | INTRAVENOUS | Status: AC
  Filled 2015-10-04: qty 10

## 2015-10-04 MED ORDER — METOPROLOL TARTRATE 12.5 MG HALF TABLET: 12.5000 mg | ORAL_TABLET | Freq: Two times a day (BID) | ORAL | Status: DC

## 2015-10-04 MED ORDER — LACTATED RINGERS IV SOLN
INTRAVENOUS | Status: DC | PRN
  Administered 2015-10-04: 09:00:00 via INTRAVENOUS

## 2015-10-04 MED ORDER — DOCUSATE SODIUM 100 MG PO CAPS: 200.0000 mg | ORAL_CAPSULE | Freq: Every day | ORAL | Status: DC

## 2015-10-04 MED ORDER — OXYCODONE HCL 5 MG PO TABS: 5.0000 mg | ORAL_TABLET | ORAL | Status: DC | PRN

## 2015-10-04 MED ORDER — LACTATED RINGERS IV SOLN
INTRAVENOUS | Status: DC | PRN
  Administered 2015-10-04 (×2): via INTRAVENOUS

## 2015-10-04 MED ORDER — ACETAMINOPHEN 160 MG/5ML PO SOLN
1000.0000 mg | Freq: Four times a day (QID) | ORAL | Status: DC
  Administered 2015-10-05 (×3): 1000 mg
  Filled 2015-10-04 (×2): qty 40.6

## 2015-10-04 MED ORDER — HEPARIN SODIUM (PORCINE) 1000 UNIT/ML IJ SOLN
INTRAMUSCULAR | Status: AC
  Filled 2015-10-04: qty 1

## 2015-10-04 MED ORDER — ASPIRIN 81 MG PO CHEW
324.0000 mg | CHEWABLE_TABLET | Freq: Every day | ORAL | Status: DC
  Administered 2015-10-05: 324 mg
  Filled 2015-10-04: qty 4

## 2015-10-04 MED ORDER — FAMOTIDINE IN NACL 20-0.9 MG/50ML-% IV SOLN
20.0000 mg | Freq: Two times a day (BID) | INTRAVENOUS | Status: AC
  Administered 2015-10-04 (×2): 20 mg via INTRAVENOUS
  Filled 2015-10-04: qty 50

## 2015-10-04 MED ORDER — PHENYLEPHRINE HCL 10 MG/ML IJ SOLN
10.0000 mg | INTRAMUSCULAR | Status: DC | PRN
  Administered 2015-10-04: 10 ug/min via INTRAVENOUS

## 2015-10-04 MED ORDER — ONDANSETRON HCL 4 MG/2ML IJ SOLN: 4.0000 mg | Freq: Four times a day (QID) | INTRAMUSCULAR | Status: DC | PRN

## 2015-10-04 MED ORDER — MILRINONE IN DEXTROSE 20 MG/100ML IV SOLN
0.2000 ug/kg/min | INTRAVENOUS | Status: DC
  Administered 2015-10-05: 0.2 ug/kg/min via INTRAVENOUS
  Filled 2015-10-04: qty 100

## 2015-10-04 MED ORDER — METOPROLOL TARTRATE 1 MG/ML IV SOLN: 2.5000 mg | INTRAVENOUS | Status: DC | PRN

## 2015-10-04 MED ORDER — FENTANYL CITRATE (PF) 100 MCG/2ML IJ SOLN
INTRAMUSCULAR | Status: DC | PRN
  Administered 2015-10-04: 200 ug via INTRAVENOUS
  Administered 2015-10-04: 100 ug via INTRAVENOUS
  Administered 2015-10-04: 200 ug via INTRAVENOUS
  Administered 2015-10-04: 25 ug via INTRAVENOUS
  Administered 2015-10-04 (×2): 150 ug via INTRAVENOUS
  Administered 2015-10-04: 25 ug via INTRAVENOUS
  Administered 2015-10-04: 100 ug via INTRAVENOUS
  Administered 2015-10-04: 150 ug via INTRAVENOUS

## 2015-10-04 MED ORDER — NOREPINEPHRINE BITARTRATE 1 MG/ML IV SOLN
0.0000 ug/min | INTRAVENOUS | Status: DC
  Administered 2015-10-05: 2 ug/min via INTRAVENOUS
  Filled 2015-10-04: qty 4

## 2015-10-04 MED ORDER — LIDOCAINE HCL (CARDIAC) 20 MG/ML IV SOLN
INTRAVENOUS | Status: DC | PRN
  Administered 2015-10-04: 100 mg via INTRAVENOUS

## 2015-10-04 MED ORDER — SODIUM CHLORIDE 0.45 % IV SOLN: INTRAVENOUS | Status: DC | PRN

## 2015-10-04 MED ORDER — LACTATED RINGERS IV SOLN: INTRAVENOUS | Status: DC

## 2015-10-04 MED ORDER — SODIUM CHLORIDE 0.9 % IJ SOLN
INTRAMUSCULAR | Status: AC
  Filled 2015-10-04: qty 10

## 2015-10-04 MED ORDER — DEXMEDETOMIDINE HCL 200 MCG/2ML IV SOLN
200.0000 ug | INTRAVENOUS | Status: DC | PRN
  Administered 2015-10-04: .3 ug via INTRAVENOUS

## 2015-10-04 MED ORDER — LACTATED RINGERS IV SOLN
INTRAVENOUS | Status: DC
  Administered 2015-10-05 (×2): via INTRAVENOUS

## 2015-10-04 MED ORDER — SODIUM CHLORIDE 0.9 % IV SOLN
Freq: Once | INTRAVENOUS | Status: DC
Start: 2015-10-04 — End: 2015-10-04

## 2015-10-04 MED ORDER — PROTAMINE SULFATE 10 MG/ML IV SOLN
INTRAVENOUS | Status: AC
  Filled 2015-10-04: qty 5

## 2015-10-04 MED ORDER — POTASSIUM CHLORIDE 10 MEQ/50ML IV SOLN
10.0000 meq | INTRAVENOUS | Status: AC
  Administered 2015-10-04 (×2): 10 meq via INTRAVENOUS

## 2015-10-04 MED ORDER — INSULIN REGULAR HUMAN 100 UNIT/ML IJ SOLN
250.0000 [IU] | INTRAMUSCULAR | Status: DC | PRN
  Administered 2015-10-04: 1 [IU]/h via INTRAVENOUS

## 2015-10-04 MED ORDER — POTASSIUM CHLORIDE 10 MEQ/50ML IV SOLN
10.0000 meq | INTRAVENOUS | Status: AC
  Administered 2015-10-04 (×3): 10 meq via INTRAVENOUS

## 2015-10-04 MED ORDER — TRAMADOL HCL 50 MG PO TABS: 50.0000 mg | ORAL_TABLET | ORAL | Status: DC | PRN

## 2015-10-04 MED ORDER — ROCURONIUM BROMIDE 50 MG/5ML IV SOLN
INTRAVENOUS | Status: AC
  Filled 2015-10-04: qty 5

## 2015-10-04 MED ORDER — FENTANYL CITRATE (PF) 250 MCG/5ML IJ SOLN
INTRAMUSCULAR | Status: AC
  Filled 2015-10-04: qty 5

## 2015-10-04 MED ORDER — INSULIN REGULAR BOLUS VIA INFUSION
0.0000 [IU] | Freq: Three times a day (TID) | INTRAVENOUS | Status: DC
  Filled 2015-10-04: qty 10

## 2015-10-04 MED ORDER — FENTANYL CITRATE (PF) 250 MCG/5ML IJ SOLN
INTRAMUSCULAR | Status: AC
  Filled 2015-10-04: qty 20

## 2015-10-04 MED ORDER — LACTATED RINGERS IV SOLN: 500.0000 mL | Freq: Once | INTRAVENOUS | Status: DC | PRN

## 2015-10-04 MED ORDER — SODIUM CHLORIDE 0.9 % IR SOLN
Status: DC | PRN
  Administered 2015-10-04: 3000 mL

## 2015-10-04 MED ORDER — LIDOCAINE HCL (CARDIAC) 20 MG/ML IV SOLN
INTRAVENOUS | Status: AC
  Filled 2015-10-04: qty 5

## 2015-10-04 MED ORDER — EPHEDRINE SULFATE 50 MG/ML IJ SOLN
INTRAMUSCULAR | Status: AC
  Filled 2015-10-04: qty 1

## 2015-10-04 MED ORDER — PHENYLEPHRINE HCL 10 MG/ML IJ SOLN
0.0000 ug/min | INTRAVENOUS | Status: DC
  Administered 2015-10-04: 30 ug/min via INTRAVENOUS
  Administered 2015-10-05: 60 ug/min via INTRAVENOUS
  Filled 2015-10-04 (×4): qty 2

## 2015-10-04 MED ORDER — EPHEDRINE SULFATE 50 MG/ML IJ SOLN
INTRAMUSCULAR | Status: DC | PRN
  Administered 2015-10-04 (×2): 5 mg via INTRAVENOUS
  Administered 2015-10-04: 10 mg via INTRAVENOUS

## 2015-10-04 MED ORDER — MORPHINE SULFATE (PF) 2 MG/ML IV SOLN
2.0000 mg | INTRAVENOUS | Status: DC | PRN
  Filled 2015-10-04: qty 2

## 2015-10-04 MED ORDER — ACETAMINOPHEN 160 MG/5ML PO SOLN: 650.0000 mg | Freq: Once | ORAL | Status: AC

## 2015-10-04 MED ORDER — LACTATED RINGERS IV SOLN
INTRAVENOUS | Status: DC | PRN
  Administered 2015-10-04: 08:00:00 via INTRAVENOUS

## 2015-10-04 MED ORDER — ALBUMIN HUMAN 5 % IV SOLN
250.0000 mL | INTRAVENOUS | Status: AC | PRN
  Administered 2015-10-04 – 2015-10-05 (×4): 250 mL via INTRAVENOUS
  Filled 2015-10-04: qty 250

## 2015-10-04 MED ORDER — CALCIUM CHLORIDE 10 % IV SOLN
INTRAVENOUS | Status: DC | PRN
  Administered 2015-10-04 (×2): 1 g via INTRAVENOUS

## 2015-10-04 MED ORDER — MIDAZOLAM HCL 2 MG/2ML IJ SOLN
2.0000 mg | INTRAMUSCULAR | Status: DC | PRN
  Administered 2015-10-05: 2 mg via INTRAVENOUS
  Filled 2015-10-04: qty 2

## 2015-10-04 MED ORDER — 0.9 % SODIUM CHLORIDE (POUR BTL) OPTIME
TOPICAL | Status: DC | PRN
  Administered 2015-10-04: 6000 mL

## 2015-10-04 MED ORDER — MAGNESIUM SULFATE 4 GM/100ML IV SOLN
4.0000 g | Freq: Once | INTRAVENOUS | Status: AC
  Administered 2015-10-04: 4 g via INTRAVENOUS
  Filled 2015-10-04: qty 100

## 2015-10-04 MED ORDER — SUCCINYLCHOLINE CHLORIDE 20 MG/ML IJ SOLN
INTRAMUSCULAR | Status: AC
  Filled 2015-10-04: qty 1

## 2015-10-04 MED ORDER — ROCURONIUM BROMIDE 50 MG/5ML IV SOLN
INTRAVENOUS | Status: AC
  Filled 2015-10-04: qty 1

## 2015-10-04 MED ORDER — MIDAZOLAM HCL 5 MG/5ML IJ SOLN
INTRAMUSCULAR | Status: DC | PRN
  Administered 2015-10-04: 1 mg via INTRAVENOUS
  Administered 2015-10-04: 4 mg via INTRAVENOUS

## 2015-10-04 MED ORDER — PROTAMINE SULFATE 10 MG/ML IV SOLN
INTRAVENOUS | Status: DC | PRN
  Administered 2015-10-04: 45 mg via INTRAVENOUS
  Administered 2015-10-04 (×2): 100 mg via INTRAVENOUS
  Administered 2015-10-04: 5 mg via INTRAVENOUS
  Administered 2015-10-04: 100 mg via INTRAVENOUS

## 2015-10-04 MED ORDER — ANTISEPTIC ORAL RINSE SOLUTION (CORINZ)
7.0000 mL | Freq: Four times a day (QID) | OROMUCOSAL | Status: DC
  Administered 2015-10-05 (×3): 7 mL via OROMUCOSAL

## 2015-10-04 MED ORDER — ACETAMINOPHEN 500 MG PO TABS: 1000.0000 mg | ORAL_TABLET | Freq: Four times a day (QID) | ORAL | Status: DC

## 2015-10-04 MED ORDER — VANCOMYCIN HCL IN DEXTROSE 1-5 GM/200ML-% IV SOLN
1000.0000 mg | Freq: Once | INTRAVENOUS | Status: AC
  Administered 2015-10-05: 1000 mg via INTRAVENOUS
  Filled 2015-10-04: qty 200

## 2015-10-04 MED ORDER — HEPARIN SODIUM (PORCINE) 1000 UNIT/ML IJ SOLN
INTRAMUSCULAR | Status: DC | PRN
  Administered 2015-10-04: 40 mL via INTRAVENOUS

## 2015-10-04 MED ORDER — MILRINONE IN DEXTROSE 20 MG/100ML IV SOLN
0.1250 ug/kg/min | INTRAVENOUS | Status: DC
  Administered 2015-10-04: .2 ug/kg/min via INTRAVENOUS
  Filled 2015-10-04: qty 100

## 2015-10-04 MED ORDER — DEXMEDETOMIDINE HCL IN NACL 200 MCG/50ML IV SOLN
0.0000 ug/kg/h | INTRAVENOUS | Status: DC
  Administered 2015-10-04: 0.7 ug/kg/h via INTRAVENOUS
  Administered 2015-10-05: 0.4 ug/kg/h via INTRAVENOUS
  Filled 2015-10-04 (×3): qty 50

## 2015-10-04 MED ORDER — CHLORHEXIDINE GLUCONATE 0.12% ORAL RINSE (MEDLINE KIT)
15.0000 mL | Freq: Two times a day (BID) | OROMUCOSAL | Status: DC
  Administered 2015-10-04 – 2015-10-05 (×2): 15 mL via OROMUCOSAL

## 2015-10-04 MED ORDER — PROTAMINE SULFATE 10 MG/ML IV SOLN
INTRAVENOUS | Status: AC
  Filled 2015-10-04: qty 25

## 2015-10-04 MED ORDER — ACETAMINOPHEN 650 MG RE SUPP
650.0000 mg | Freq: Once | RECTAL | Status: AC
  Administered 2015-10-04: 650 mg via RECTAL

## 2015-10-04 MED ORDER — DEXTROSE 5 % IV SOLN
1.5000 g | Freq: Two times a day (BID) | INTRAVENOUS | Status: DC
  Administered 2015-10-05 (×3): 1.5 g via INTRAVENOUS
  Filled 2015-10-04 (×4): qty 1.5

## 2015-10-04 MED ORDER — METOPROLOL TARTRATE 25 MG/10 ML ORAL SUSPENSION
12.5000 mg | Freq: Two times a day (BID) | ORAL | Status: DC
  Administered 2015-10-05: 12.5 mg
  Filled 2015-10-04: qty 5

## 2015-10-04 MED ORDER — SODIUM CHLORIDE 0.9 % IV SOLN: INTRAVENOUS | Status: DC

## 2015-10-04 MED ORDER — ASPIRIN EC 325 MG PO TBEC: 325.0000 mg | DELAYED_RELEASE_TABLET | Freq: Every day | ORAL | Status: DC

## 2015-10-04 MED ORDER — BISACODYL 10 MG RE SUPP
10.0000 mg | Freq: Every day | RECTAL | Status: DC
  Administered 2015-10-05: 10 mg via RECTAL
  Filled 2015-10-04: qty 1

## 2015-10-04 MED ORDER — ROCURONIUM BROMIDE 100 MG/10ML IV SOLN
INTRAVENOUS | Status: DC | PRN
  Administered 2015-10-04 (×4): 30 mg via INTRAVENOUS
  Administered 2015-10-04: 50 mg via INTRAVENOUS
  Administered 2015-10-04: 80 mg via INTRAVENOUS

## 2015-10-04 MED ORDER — MIDAZOLAM HCL 10 MG/2ML IJ SOLN
INTRAMUSCULAR | Status: AC
  Filled 2015-10-04: qty 2

## 2015-10-04 MED ORDER — SODIUM CHLORIDE 0.9 % IV SOLN
INTRAVENOUS | Status: DC | PRN
  Administered 2015-10-04: 14:00:00 via INTRAVENOUS

## 2015-10-04 MED ORDER — STERILE WATER FOR INJECTION IJ SOLN
INTRAMUSCULAR | Status: AC
  Filled 2015-10-04: qty 20

## 2015-10-04 MED ORDER — CHLORHEXIDINE GLUCONATE 0.12 % MT SOLN
15.0000 mL | OROMUCOSAL | Status: AC
  Administered 2015-10-04: 15 mL via OROMUCOSAL
  Filled 2015-10-04: qty 15

## 2015-10-04 MED ORDER — CALCIUM CHLORIDE 10 % IV SOLN
INTRAVENOUS | Status: AC
  Filled 2015-10-04: qty 20

## 2015-10-04 MED ORDER — ARTIFICIAL TEARS OP OINT
TOPICAL_OINTMENT | OPHTHALMIC | Status: AC
  Filled 2015-10-04: qty 3.5

## 2015-10-04 MED ORDER — PROPOFOL 10 MG/ML IV BOLUS
INTRAVENOUS | Status: AC
  Filled 2015-10-04: qty 20

## 2015-10-04 MED ORDER — SODIUM CHLORIDE 0.9 % IV SOLN
10.0000 g | INTRAVENOUS | Status: DC | PRN
  Administered 2015-10-04: 5 g/h via INTRAVENOUS

## 2015-10-04 MED ORDER — SODIUM CHLORIDE 0.9 % IV SOLN: 250.0000 mL | INTRAVENOUS | Status: DC

## 2015-10-04 MED ORDER — SODIUM CHLORIDE 0.9% FLUSH: 3.0000 mL | INTRAVENOUS | Status: DC | PRN

## 2015-10-04 MED ORDER — PANTOPRAZOLE SODIUM 40 MG PO TBEC: 40.0000 mg | DELAYED_RELEASE_TABLET | Freq: Every day | ORAL | Status: DC

## 2015-10-04 MED ORDER — SODIUM CHLORIDE 0.9 % IV SOLN
INTRAVENOUS | Status: DC
  Administered 2015-10-04: 18:00:00 via INTRAVENOUS

## 2015-10-04 MED FILL — Potassium Chloride Inj 2 mEq/ML: INTRAVENOUS | Qty: 40 | Status: AC

## 2015-10-04 MED FILL — Magnesium Sulfate Inj 50%: INTRAMUSCULAR | Qty: 10 | Status: AC

## 2015-10-04 MED FILL — Heparin Sodium (Porcine) Inj 1000 Unit/ML: INTRAMUSCULAR | Qty: 30 | Status: AC

## 2015-10-04 SURGICAL SUPPLY — 129 items
ADAPTER CARDIO PERF ANTE/RETRO (ADAPTER) ×3 IMPLANT
BAG DECANTER FOR FLEXI CONT (MISCELLANEOUS) ×6 IMPLANT
BLADE SURG 11 STRL SS (BLADE) ×9 IMPLANT
CANISTER SUCTION 2500CC (MISCELLANEOUS) ×6 IMPLANT
CANNULA EZ GLIDE AORTIC 21FR (CANNULA) ×3 IMPLANT
CANNULA FEM VENOUS REMOTE 22FR (CANNULA) ×3 IMPLANT
CANNULA FEMORAL ART 14 SM (MISCELLANEOUS) ×3 IMPLANT
CANNULA GUNDRY RCSP 15FR (MISCELLANEOUS) ×3 IMPLANT
CANNULA OPTISITE PERFUSION 16F (CANNULA) IMPLANT
CANNULA OPTISITE PERFUSION 18F (CANNULA) ×3 IMPLANT
CANNULA SUMP PERICARDIAL (CANNULA) ×6 IMPLANT
CATH KIT ON Q 5IN SLV (PAIN MANAGEMENT) IMPLANT
CONN 3/8X3/8 GISH STERILE (MISCELLANEOUS) ×3 IMPLANT
CONN ST 1/4X3/8  BEN (MISCELLANEOUS) ×2
CONN ST 1/4X3/8 BEN (MISCELLANEOUS) ×4 IMPLANT
CONNECTOR 1/2X3/8X1/2 3 WAY (MISCELLANEOUS) ×1
CONNECTOR 1/2X3/8X1/2 3WAY (MISCELLANEOUS) ×2 IMPLANT
CONT SPEC 4OZ CLIKSEAL STRL BL (MISCELLANEOUS) ×3 IMPLANT
CONT SPEC STER OR (MISCELLANEOUS) ×3 IMPLANT
COUNTER NEEDLE 20 DBL MAG RED (NEEDLE) ×3 IMPLANT
COVER BACK TABLE 24X17X13 BIG (DRAPES) ×3 IMPLANT
CRADLE DONUT ADULT HEAD (MISCELLANEOUS) ×3 IMPLANT
DERMABOND ADVANCED (GAUZE/BANDAGES/DRESSINGS) ×2
DERMABOND ADVANCED .7 DNX12 (GAUZE/BANDAGES/DRESSINGS) ×4 IMPLANT
DEVICE PMI PUNCTURE CLOSURE (MISCELLANEOUS) ×3 IMPLANT
DEVICE SUT CK QUICK LOAD MINI (Prosthesis & Implant Heart) ×6 IMPLANT
DEVICE TROCAR PUNCTURE CLOSURE (ENDOMECHANICALS) ×3 IMPLANT
DRAIN CHANNEL 28F RND 3/8 FF (WOUND CARE) ×6 IMPLANT
DRAPE BILATERAL SPLIT (DRAPES) ×3 IMPLANT
DRAPE C-ARM 42X72 X-RAY (DRAPES) ×3 IMPLANT
DRAPE CV SPLIT W-CLR ANES SCRN (DRAPES) ×3 IMPLANT
DRAPE INCISE IOBAN 66X45 STRL (DRAPES) ×9 IMPLANT
DRAPE SLUSH/WARMER DISC (DRAPES) ×3 IMPLANT
DRSG COVADERM 4X8 (GAUZE/BANDAGES/DRESSINGS) ×3 IMPLANT
ELECT BLADE 6.5 EXT (BLADE) ×3 IMPLANT
ELECT REM PT RETURN 9FT ADLT (ELECTROSURGICAL) ×6
ELECTRODE REM PT RTRN 9FT ADLT (ELECTROSURGICAL) ×4 IMPLANT
FELT TEFLON 1X6 (MISCELLANEOUS) ×6 IMPLANT
FEMORAL VENOUS CANN RAP (CANNULA) IMPLANT
GLOVE BIO SURGEON STRL SZ 6.5 (GLOVE) ×15 IMPLANT
GLOVE BIO SURGEON STRL SZ7 (GLOVE) ×15 IMPLANT
GLOVE ORTHO TXT STRL SZ7.5 (GLOVE) ×9 IMPLANT
GOWN STRL REUS W/ TWL LRG LVL3 (GOWN DISPOSABLE) ×8 IMPLANT
GOWN STRL REUS W/TWL LRG LVL3 (GOWN DISPOSABLE) ×4
HEMOSTAT POWDER SURGIFOAM 1G (HEMOSTASIS) ×9 IMPLANT
IV NS IRRIG 3000ML ARTHROMATIC (IV SOLUTION) ×3 IMPLANT
KIT BASIN OR (CUSTOM PROCEDURE TRAY) ×3 IMPLANT
KIT DILATOR VASC 18G NDL (KITS) ×3 IMPLANT
KIT DRAINAGE VACCUM ASSIST (KITS) ×3 IMPLANT
KIT ROOM TURNOVER OR (KITS) ×3 IMPLANT
KIT SUCTION CATH 14FR (SUCTIONS) ×9 IMPLANT
KIT SUT CK MINI COMBO 4X17 (Prosthesis & Implant Heart) ×3 IMPLANT
LEAD PACING MYOCARDI (MISCELLANEOUS) ×3 IMPLANT
LINE VENT (MISCELLANEOUS) ×3 IMPLANT
NDL SUT 1 .5 CRC FRENCH EYE (NEEDLE) ×2 IMPLANT
NEEDLE AORTIC ROOT 14G 7F (CATHETERS) ×3 IMPLANT
NEEDLE FRENCH EYE (NEEDLE) ×1
NS IRRIG 1000ML POUR BTL (IV SOLUTION) ×18 IMPLANT
PACK OPEN HEART (CUSTOM PROCEDURE TRAY) ×3 IMPLANT
PAD ARMBOARD 7.5X6 YLW CONV (MISCELLANEOUS) ×6 IMPLANT
PAD ELECT DEFIB RADIOL ZOLL (MISCELLANEOUS) ×3 IMPLANT
PATCH CORMATRIX 4CMX7CM (Prosthesis & Implant Heart) ×3 IMPLANT
RETRACTOR TRL SOFT TISSUE LG (INSTRUMENTS) IMPLANT
RETRACTOR TRM SOFT TISSUE 7.5 (INSTRUMENTS) IMPLANT
RING HOLDER ANNULOPLASTY (MISCELLANEOUS) ×3 IMPLANT
RING MITRAL MEMO 3D 30MM SMD30 (Prosthesis & Implant Heart) ×3 IMPLANT
SET CANNULATION TOURNIQUET (MISCELLANEOUS) ×3 IMPLANT
SET CARDIOPLEGIA MPS 5001102 (MISCELLANEOUS) ×3 IMPLANT
SET IRRIG TUBING LAPAROSCOPIC (IRRIGATION / IRRIGATOR) ×3 IMPLANT
SOLUTION ANTI FOG 6CC (MISCELLANEOUS) ×3 IMPLANT
SPONGE GAUZE 4X4 12PLY STER LF (GAUZE/BANDAGES/DRESSINGS) ×6 IMPLANT
SPONGE LAP 4X18 X RAY DECT (DISPOSABLE) ×3 IMPLANT
STOPCOCK 4 WAY LG BORE MALE ST (IV SETS) ×3 IMPLANT
SUT BONE WAX W31G (SUTURE) ×3 IMPLANT
SUT E-PACK MINIMALLY INVASIVE (SUTURE) ×3 IMPLANT
SUT ETHIBOND (SUTURE) ×6 IMPLANT
SUT ETHIBOND 2 0 SH (SUTURE) ×5 IMPLANT
SUT ETHIBOND 2 0 SH 36X2 (SUTURE) ×4 IMPLANT
SUT ETHIBOND 2-0 RB-1 WHT (SUTURE) ×6 IMPLANT
SUT ETHIBOND NAB MH 2-0 36IN (SUTURE) ×3 IMPLANT
SUT ETHIBOND X763 2 0 SH 1 (SUTURE) ×9 IMPLANT
SUT GORETEX CV 4 TH 22 36 (SUTURE) ×6 IMPLANT
SUT GORETEX CV-5THC-13 36IN (SUTURE) ×6 IMPLANT
SUT GORETEX CV4 TH-18 (SUTURE) ×6 IMPLANT
SUT MNCRL AB 3-0 PS2 18 (SUTURE) ×6 IMPLANT
SUT PROLENE 3 0 SH 1 (SUTURE) ×12 IMPLANT
SUT PROLENE 3 0 SH DA (SUTURE) ×33 IMPLANT
SUT PROLENE 3 0 SH1 36 (SUTURE) ×30 IMPLANT
SUT PROLENE 4 0 RB 1 (SUTURE) ×5
SUT PROLENE 4 0 SH DA (SUTURE) ×3 IMPLANT
SUT PROLENE 4-0 RB1 .5 CRCL 36 (SUTURE) ×10 IMPLANT
SUT PROLENE 5 0 C 1 36 (SUTURE) ×6 IMPLANT
SUT PROLENE 6 0 C 1 30 (SUTURE) ×15 IMPLANT
SUT PTFE CHORD X 24MM (SUTURE) ×3 IMPLANT
SUT SILK  1 MH (SUTURE) ×9
SUT SILK 1 MH (SUTURE) ×18 IMPLANT
SUT SILK 1 TIES 10X30 (SUTURE) ×3 IMPLANT
SUT SILK 2 0 SH CR/8 (SUTURE) ×3 IMPLANT
SUT SILK 2 0 TIES 10X30 (SUTURE) ×3 IMPLANT
SUT SILK 2 0SH CR/8 30 (SUTURE) ×6 IMPLANT
SUT SILK 3 0 (SUTURE) ×1
SUT SILK 3 0 SH CR/8 (SUTURE) ×3 IMPLANT
SUT SILK 3 0SH CR/8 30 (SUTURE) ×3 IMPLANT
SUT SILK 3-0 18XBRD TIE 12 (SUTURE) ×2 IMPLANT
SUT TEM PAC WIRE 2 0 SH (SUTURE) ×6 IMPLANT
SUT VIC AB 1 CTX 36 (SUTURE) ×2
SUT VIC AB 1 CTX36XBRD ANBCTR (SUTURE) ×4 IMPLANT
SUT VIC AB 2-0 CTX 36 (SUTURE) ×6 IMPLANT
SUT VIC AB 2-0 UR6 27 (SUTURE) ×6 IMPLANT
SUT VIC AB 3-0 SH 18 (SUTURE) IMPLANT
SUT VIC AB 3-0 SH 8-18 (SUTURE) ×15 IMPLANT
SUT VICRYL 2 TP 1 (SUTURE) ×3 IMPLANT
SWAB COLLECTION DEVICE MRSA (MISCELLANEOUS) ×3 IMPLANT
SYRINGE 10CC LL (SYRINGE) ×3 IMPLANT
SYSTEM SAHARA CHEST DRAIN ATS (WOUND CARE) ×3 IMPLANT
TAPE CLOTH SURG 4X10 WHT LF (GAUZE/BANDAGES/DRESSINGS) ×3 IMPLANT
TAPE PAPER 2X10 WHT MICROPORE (GAUZE/BANDAGES/DRESSINGS) ×3 IMPLANT
TOWEL OR 17X24 6PK STRL BLUE (TOWEL DISPOSABLE) ×6 IMPLANT
TOWEL OR 17X26 10 PK STRL BLUE (TOWEL DISPOSABLE) ×6 IMPLANT
TRAY FOLEY IC TEMP SENS 16FR (CATHETERS) ×3 IMPLANT
TROCAR XCEL BLADELESS 5X75MML (TROCAR) ×3 IMPLANT
TROCAR XCEL NON-BLD 11X100MML (ENDOMECHANICALS) ×6 IMPLANT
TUBE ANAEROBIC SPECIMEN COL (MISCELLANEOUS) ×3 IMPLANT
TUBE CONNECTING 20X1/4 (TUBING) ×3 IMPLANT
TUBE SUCT INTRACARD DLP 20F (MISCELLANEOUS) ×3 IMPLANT
TUNNELER SHEATH ON-Q 11GX8 DSP (PAIN MANAGEMENT) IMPLANT
UNDERPAD 30X30 INCONTINENT (UNDERPADS AND DIAPERS) ×3 IMPLANT
WATER STERILE IRR 1000ML POUR (IV SOLUTION) ×6 IMPLANT
WIRE J 3MM .035X145CM (WIRE) ×3 IMPLANT

## 2015-10-04 NOTE — Progress Notes (Signed)
[facility] BRIEF SICU PROGRESS NOTE  Day of Surgery  S/P Procedure(s) (LRB): MINIMALLY INVASIVE MITRAL VALVE REPAIR (MVR) (Right) TRANSESOPHAGEAL ECHOCARDIOGRAM (TEE) (N/A)   Sedated on vent AAI pacing w/ stable hemodynamics, low PA pressures O2 sats 98% on 60% FiO2 Chest tube output low UOP adequate Labs okay CXR w/ some patchy atelectasis in right upper lobe  Plan: Continue routine early postop  Rexene Alberts, MD 10/04/2015 8:51 PM

## 2015-10-04 NOTE — Op Note (Signed)
CARDIOTHORACIC SURGERY OPERATIVE NOTE  Date of Procedure:  10/04/2015  Preoperative Diagnosis: Severe Mitral Regurgitation  Postoperative Diagnosis: Same  Procedure:    Minimally-Invasive Mitral Valve Repair  Complex valvuloplasty including artificial Gore-tex neochord placement x6  Primary repair of perforation of anterior leaflet  Plication closure of cleft between P2 and P3 portions of posterior leaflet  [name] 3D Ring Annuloplasty (size 63mm, catalog # [id number], serial # [id number])    Surgeon: Valentina Gu. 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  Type II dysfunction with flail segment (P3) of posterior leaflet and severe mitral regurgitation  Small perforation of middle scallop of anterior leaflet with appearance suggestive of healed endocarditis but no active vegetations  Severe left ventricular hypertrophy with severe diastolic dysfunction  Normal LV systolic function  Transient dynamic systolic anterior motion (SAM) early after separation from CPB which improved dramatically with volume loading and decreased heartrate  Mild residual mitral regurgitation in early systole                 BRIEF CLINICAL NOTE AND INDICATIONS FOR SURGERY  Patient is an 82 year old male with long-standing history of mitral valve prolapse with mitral regurgitation and chronic diastolic congestive heart failure, coronary artery disease status post PCI and stenting of the LAD and RCA using drug-eluting stents, recurrent SVT, pulmonary hypertension, type 2 diabetes mellitus, hyperlipidemia, and chronic back pain who has been referred for surgical consultation to discuss treatment options for management of severe symptomatic primary mitral regurgitation. The patient states that he has been told that he had a heart murmur for nearly  all of his life. For at least the past 10 years the patient has also had intermittent history of palpitations tCanal to care with his heart ratehat were eventually diagnosed as paroxysmal SVT. He has been followed by Dr. Percival Spanish since 2014 with primary complaints of exertional fatigue and lower extremity edema. Transthoracic echocardiogram performed April 2014 was reported to demonstrate the presence of mild mitral regurgitation with normal left ventricular systolic function. The patient was admitted to the hospital in April 2016 with acute exacerbation of chronic diastolic congestive heart failure with primary complaint of worsening lower extremity edema despite escalating doses of diuretic therapy. Transthoracic echocardiogram performed at that time revealed the presence of a mobile mass on the each relief of the mitral valve that was felt to be li, coronary lobekely chronic flail segment with at least moderate mitral regurgitation. Transesophageal echocardiogram was recommended. Troponin levels were slightly elevated but remained flat during that admission. Nuclear stress test was felt to be intermediate risk. The patient underwent diagnostic cardiac catheterization by Dr. Claiborne Billings. He was found to have severe two-vessel coronary artery disease and treated with multivessel PCI and stenting using drug eluding stents in both the LAD and RCA territories. Right heart catheterization was not performed. Transesophageal echocardiogram was not performed. The patient was not referred for surgical consultation at that time.  The patient states that he has not felt any better since he underwent PCI and stenting. He continues to complain primarily of increasing fatigue with increasing bilateral lower extremity edema. He also reports some exertional shortness of breath. He denies any history of resting shortness of breath, PND, orthopnea, or lower extremity edema. He was seen in follow-up with Dr. Percival Spanish in late  October and subsequent transthoracic echocardiogram revealed normal left ventricular systolic function with likely flail segment  of the anterior leaflet of the mitral valve and severe mitral regurgitation. Shortly after the echocardiogram was performed the patient was hospitalized briefly with an episode of persistent SVT that failed adenosine and required DC cardioversion. The patient was referred to electrophysiology and seen in consultation by Dr. Curt Bears. He subsequently underwent transesophageal echocardiogram by Dr. Oval Linsey on 06/22/2015. 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 There was normal left ventricular systolic function with ejection fraction estimated 60-65%. There was no sign of thrombus in the left atrial cavity or left atrial appendage. There was normal right ventricular size and systolic function. There was mild tricuspid regurgitation. The patient was referred for surgical consultation.  The patient has been seen in consultation and counseled at length regarding the indications, risks and potential benefits of surgery.  All questions have been answered, and the patient provides full informed consent for the operation as described.    DETAILS OF THE OPERATIVE PROCEDURE  Preparation:  The patient is brought to the operating room on the above mentioned date and central monitoring was established by the anesthesia team including placement of Swan-Ganz catheter through the left internal jugular vein.  There was mild to moderate pulmonary hypertension at baseline.  A radial arterial line is placed. The patient is placed in the supine position on the operating table.  Intravenous antibiotics are administered. General endotracheal anesthesia is induced uneventfully. The patient is initially intubated using a dual lumen endotracheal tube.  A Foley catheter is placed.  Baseline  transesophageal echocardiogram was performed.  Findings were notable for fibroelastic deficiency type degenerative disease of the mitral valve with an obvious flail segment (P3) of the posterior leaflet with multiple ruptured primary chordae tendineae. There was type II dysfunction with severe mitral regurgitation. The jet of regurgitation was eccentric and traveled in multiple directions. There was thickening in the middle scallop of the anterior leaflet but there did not appear to be prolapse of the anterior leaflet. There was extremely severe left ventricular hypertrophy with near complete obliteration of the left ventricular chamber during and systole. There was normal left ventricular systolic function. There was mild to moderate (1+/2+) central aortic insufficiency. There was normal right ventricular size and systolic function. The tricuspid annulus measured 2.9 cm in its greatest diameter. There was mild to moderate (2+) tricuspid regurgitation.  A soft roll is placed behind the patient's left scapula and the neck gently extended and turned to the left.   The patient's right neck, chest, abdomen, both groins, and both lower extremities are prepared and draped in a sterile manner. A time out procedure is performed.  Surgical Approach:  A right miniature anterolateral thoracotomy incision is performed. The incision is placed just lateral to and superior to the right nipple. The pectoralis major muscle is retracted medially and completely preserved. The right pleural space is entered through the 3rd intercostal space. A soft tissue retractor is placed.  Two 11 mm ports are placed through separate stab incisions inferiorly. During this portion of the procedure there was unsatisfactory separation of the left and right lungs with incomplete collapse of the right lung during attempts at single lung ventilation. The dual lumen endotracheal tube was repositioned by Dr. Jillyn Hidden and eventually began to function  normally.  The right pleural space is insufflated continuously with carbon dioxide gas through the posterior port during the remainder of the operation.   A pledgeted sutures placed  through the dome of the right hemidiaphragm and retracted inferiorly to facilitate exposure.  A longitudinal incision is made in the pericardium 3 cm anterior to the phrenic nerve and silk traction sutures are placed on either side of the incision for exposure.   Extracorporeal Cardiopulmonary Bypass and Myocardial Protection:  A small incision is made in the right inguinal crease and the anterior surface of the right common femoral artery and right common femoral vein are identified.  The patient is placed in Trendelenburg position. The right internal jugular vein is cannulated with Seldinger technique and a guidewire advanced into the right atrium. The patient is heparinized systemically. The right internal jugular vein is cannulated with a 14 [place] pediatric femoral venous cannula. Pursestring sutures are placed on the anterior surface of the right common femoral vein and right common femoral artery. The right common femoral vein is cannulated with the Seldinger technique and a guidewire is advanced under transesophageal echocardiogram guidance through the right atrium. The femoral vein is cannulated with a long 22 French femoral venous cannula. The right common femoral artery is cannulated with Seldinger technique and a flexible guidewire is advanced until it can be appreciated intraluminally in the descending thoracic aorta on transesophageal echocardiogram. The femoral artery is cannulated with an 18 French femoral arterial cannula.  Adequate heparinization is verified.     The entire pre-bypass portion of the operation was notable for stable hemodynamics.  Cardiopulmonary bypass was begun.  Vacuum assist venous drainage is utilized. The incision in the pericardium is extended in both directions. Venous drainage and  exposure are notably excellent. A retrograde cardioplegia cannula is placed through the right atrium into the coronary sinus using transesophageal echocardiogram guidance.  An antegrade cardioplegia cannula is placed in the ascending aorta.    The patient is cooled to 28C systemic temperature.  The aortic cross clamp is applied and cold blood cardioplegia is delivered initially in an antegrade fashion through the aortic root.   Supplemental cardioplegia is given retrograde through the coronary sinus catheter. The initial cardioplegic arrest is rapid with early diastolic arrest.  Repeat doses of cardioplegia are administered intermittently every 20 to 30 minutes throughout the entire cross clamp portion of the operation through the aortic root and through the coronary sinus catheter in order to maintain completely flat electrocardiogram.  Myocardial protection was felt to be excellent.   Mitral Valve Repair:  A left atriotomy incision was performed through the interatrial groove and extended partially across the back wall of the left atrium after opening the oblique sinus inferiorly.  The mitral valve is exposed using a self-retaining retractor.  The mitral valve was inspected and notable for fibroelastic deficiency type degenerative disease of the mitral valve with multiple ruptured primary chordae tendineae to the P3 segment of the posterior leaflet. The remainder of the posterior leaflet appeared essentially normal. There was a very small perforation in the middle scallop of the anterior leaflet. This was located in the middle of an area of fibrosis that had appearance consistent with a jet lesion directly opposed from the flail portion of P3. The perforation had the appearance suggestive of previous healed bacterial endocarditis although there were no obvious vegetations and nothing to suggest ongoing infection. Swab culture was sent .  Interrupted 2-0 Ethibond horizontal mattress sutures are placed  circumferentially around the entire mitral valve annulus. The sutures will ultimately be utilized for ring annuloplasty, and at this juncture there are utilized to suspend the valve symmetrically.  The flail segment  of P3 was repaired using a premeasured Gore-Tex loops to reconstruct the valve with neo-cords. Corresponding normal length primary chordae tendineae were measured and the 24 mm Gore-Tex loops were chosen.  A Chord-X CV-4 multistrand Gore-Tex suture was chosen. The papillary muscle suture was placed through the head of the posterior papilloma or muscle in a horizontal mattress fashion and tied. The remaining 6 strands were each reimplanted into the free margin of P3.  Finally, the cleft between P2 and P3 was closed using a pair of everting 4-0 Prolene sutures to stabilize P3 against P2.  The valve was tested with saline and appeared competent even without ring annuloplasty complete. The valve was sized to a 30 mm annuloplasty ring, based upon the transverse distance between the left and right commissures and the height of the anterior leaflet, corresponding to a size just slightly larger than the overall surface area of the anterior leaflet.  A [name] 3D annuloplasty ring (size 74mm, catalog #[id number], serial [id number]) was secured in place uneventfully. All ring sutures were secured using a Cor-knot device.  The valve was tested with saline and appeared competent.   The valve is again tested with saline and appears to be perfectly competent with a broad symmetrical line of coaptation of the anterior and posterior leaflet. There is no residual leak. There was a broad, symmetrical line of coaptation of the anterior and posterior leaflet which was confirmed using the blue ink test.  Rewarming is begun.   Procedure Completion:  The atriotomy was closed using a 2-layer closure of running 3-0 Prolene suture after placing a sump drain across the mitral valve to serve as a left ventricular vent.  One  final dose of warm retrograde "hot shot" cardioplegia was administered retrograde through the coronary sinus catheter while all air was evacuated through the aortic root.  The aortic cross clamp was removed after a total cross clamp time of 125 minutes.  Epicardial pacing wires are fixed to the inferior wall of the right ventricule and to the right atrial appendage. The patient is rewarmed to 37C temperature. The left ventricular vent is removed.  The patient is ventilated and flow volumes turndown while the mitral valve repair is inspected using transesophageal echocardiogram. The valve repair appears intact with no residual leak. The antegrade cardioplegia cannula is now removed. The patient is weaned and disconnected from cardiopulmonary bypass.  The patient's rhythm at separation from bypass was AV paced.  The patient was weaned from bypass on low dose milrinone at 0.02 mcg/kg/min. Total cardiopulmonary bypass time for the operation was 176 minutes.  Followup transesophageal echocardiogram performed after separation from bypass revealed a well-seated annuloplasty ring in the mitral position with a normal functioning mitral valve. There was mild (1+) residual mitral regurgitation. The mean gradient across the mitral valve was estimated to be 3 mmHg.  The femoral arterial and venous cannulae were removed uneventfully. There was a palpable pulse in the distal right common femoral artery after removal of the cannula. Protamine was administered to reverse the anticoagulation. The right internal jugular cannula was removed and manual pressure held on the neck for 15 minutes.    After reversal of heparin with protamine the patient developed transient period of hypotension associated with pulmonary hypertension. Transesophageal echocardiogram revealed completely empty left ventricle with hyperdynamic ventricular function and systolic anterior motion of the mitral valve causing severe mitral regurgitation. The  patient's pacemaker rate was cut back to 70 bpm.  The patient was resuscitated with volume and  hemodynamics slowly return to baseline. The patient was monitored carefully in the operating room for an extended period of time lasting more than 90 minutes. During this time the patient was continuously monitored for signs of recurrent systolic anterior motion of the mitral valve. Systolic pressure was maintained using Neo-Synephrine. Low-dose milrinone infusion was continued for improved diastolic function.  No other inotropic agents were utilized.  The patient remained hemodynamically stable.  Single lung ventilation was begun. The atriotomy closure was inspected for hemostasis. The pericardial sac was drained using a 28 French Bard drain placed through the anterior port incision.  The pericardium was closed using a patch of core matrix bovine submucosal tissue patch. The right pleural space is irrigated with saline solution and inspected for hemostasis. The right pleural space was drained using a 28 French Bard drain placed through the posterior port incision and an additional 36 French chest tube through a separate stab incision anteriorly. The miniature thoracotomy incision was closed in multiple layers in routine fashion. The right groin incision was inspected for hemostasis and closed in multiple layers in routine fashion.   Disposition:  The patient tolerated the procedure well.  The patient was reintubated using a single lumen endotracheal tube and subsequently transported to the surgical intensive care unit in stable condition. There were no intraoperative complications. All sponge instrument and needle counts are verified correct at completion of the operation.     Valentina Gu. Roxy Manns MD 10/04/2015 4:53 PM

## 2015-10-04 NOTE — Transfer of Care (Signed)
Immediate Anesthesia Transfer of Care Note  Patient: [name]  Procedure(s) Performed: Procedure(s): MINIMALLY INVASIVE MITRAL VALVE REPAIR (MVR) (Right) TRANSESOPHAGEAL ECHOCARDIOGRAM (TEE) (N/A)  Patient Location: PACU and SICU  Anesthesia Type:General  Level of Consciousness: sedated, unresponsive and Patient remains intubated per anesthesia plan  Airway & Oxygen Therapy: Patient remains intubated per anesthesia plan and Patient placed on Ventilator (see vital sign flow sheet for setting)  Post-op Assessment: Report given to RN and Post -op Vital signs reviewed and stable  Post vital signs: Reviewed and stable  Last Vitals:  Filed Vitals:   10/04/15 0705 10/04/15 0707  BP: 101/61   Pulse: 50 50  Temp:  36.8 C  Resp:  18    Complications: No apparent anesthesia complications

## 2015-10-04 NOTE — OR Nursing (Signed)
1st call made to 2S (spoke with Altha Harm) @1552 , providing a patient update.  Plans for transfer to Room 12 postoperatively. 2nd call made to 2S @1624 , providing a patient update. 3rd call made @1655 , transporting to 12 (2S).

## 2015-10-04 NOTE — Interval H&P Note (Signed)
History and Physical Interval Note:  10/04/2015 7:36 AM  [name]  has presented today for surgery, with the diagnosis of MR  The various methods of treatment have been discussed with the patient and family. After consideration of risks, benefits and other options for treatment, the patient has consented to  Procedure(s): MINIMALLY INVASIVE MITRAL VALVE REPAIR OR REPLACEMENT (MVR) (Right) TRANSESOPHAGEAL ECHOCARDIOGRAM (TEE) (N/A) as a surgical intervention .  The patient's history has been reviewed, patient examined, no change in status, stable for surgery.  I have reviewed the patient's chart and labs.  Questions were answered to the patient's satisfaction.     Rexene Alberts

## 2015-10-04 NOTE — Progress Notes (Signed)
  Echocardiogram Echocardiogram Transesophageal has been performed.  Darlina Sicilian M 10/04/2015, 9:56 AM

## 2015-10-04 NOTE — Anesthesia Postprocedure Evaluation (Signed)
Anesthesia Post Note  Patient: CLEMON VASILIOU  Procedure(s) Performed: Procedure(s) (LRB): MINIMALLY INVASIVE MITRAL VALVE REPAIR (MVR) (Right) TRANSESOPHAGEAL ECHOCARDIOGRAM (TEE) (N/A)  Patient location during evaluation: SICU Anesthesia Type: General Level of consciousness: sedated Pain management: pain level controlled Vital Signs Assessment: post-procedure vital signs reviewed and stable Respiratory status: patient remains intubated per anesthesia plan Cardiovascular status: stable Anesthetic complications: no Comments: Patient with bilateral breath sounds, SaO2 96%.. 105/60 BP, PA pressures 40/16.Marland Kitchen Pt on phenylephrine and milrinone. Responds well to albumin and increased SVR given dynamic LVOT obstruction lesion. He is stable on hand off.     Last Vitals:  Filed Vitals:   10/04/15 0705 10/04/15 0707  BP: 101/61   Pulse: 50 50  Temp:  36.8 C  Resp:  18    Last Pain: There were no vitals filed for this visit.               Zenaida Deed

## 2015-10-04 NOTE — Brief Op Note (Addendum)
10/04/2015  1:33 PM  PATIENT:  [name]  82 y.o. 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  Type II dysfunction with flail segment (P3) of posterior leaflet and severe mitral regurgitation  Small perforation of middle scallop of anterior leaflet with appearance suggestive of healed endocarditis but no active vegetations  Severe left ventricular hypertrophy with severe diastolic dysfunction  Normal LV systolic function  Transient dynamic systolic anterior motion (SAM) early after separation from CPB which improved dramatically with volume loading and decreased heartrate  Mild residual mitral regurgitation in early systole   Mitral Valve Etiology  MV Insufficiency: Severe  MV Disease: Yes.  MV Stenosis: No mitral valve stenosis.  MV Disease Functional Class: MV Disease Functional Class: Type II.  Etiology (Choose at least one and up to five): Degenerative. and Endocarditis.  MV Lesions (Choose at least one): Leaflet prolapse, posterior. and Annular dilation.    Mitral/Tricuspid/Pulmonary Valve Procedure  Mitral Valve Procedure Performed:  Repair: Annuloplasty., Neochrods. Number of Neochords Inserted: 6 and Mitral Cleft Repair (Scallop Closure).  Implant: Annuloplasty Device: Implant model number SMD30, Size 30, Unique Device Identifier [id number].        COMPLICATIONS: None  BASELINE WEIGHT: 100 kg  PATIENT DISPOSITION:   TO SICU IN STABLE CONDITION  Rexene Alberts, MD 10/04/2015 4:45 PM

## 2015-10-04 NOTE — Progress Notes (Signed)
Per order parameters, PO Lopressor not given due to pulse less than 60, was 50.

## 2015-10-04 NOTE — Anesthesia Procedure Notes (Addendum)
Central Venous Catheter Insertion Performed by: anesthesiologist Patient location: Pre-op. Preanesthetic checklist: patient identified, IV checked, site marked, risks and benefits discussed, surgical consent, monitors and equipment checked, pre-op evaluation, timeout performed and anesthesia consent Position: Trendelenburg Lidocaine 1% used for infiltration Landmarks identified and Seldinger technique used Catheter size: 9 Fr Central line was placed.MAC introducer Procedure performed using ultrasound guided technique. Attempts: 1 Following insertion, line sutured and dressing applied. Post procedure assessment: blood return through all ports, free fluid flow and no air. Patient tolerated the procedure well with no immediate complications.    Central Venous Catheter Insertion Performed by: anesthesiologist Patient location: Pre-op. Preanesthetic checklist: patient identified, IV checked, site marked, risks and benefits discussed, surgical consent, monitors and equipment checked, pre-op evaluation, timeout performed and anesthesia consent Landmarks identified PA cath was placed.Swan type and PA catheter depth:thermodilation and 46PA Cath depth:46 Procedure performed without using ultrasound guided technique. Attempts: 1 Patient tolerated the procedure well with no immediate complications.    Procedure Name: Intubation Date/Time: 10/04/2015 8:56 AM Performed by: Trixie Deis A Pre-anesthesia Checklist: Patient identified, Timeout performed, Emergency Drugs available, Suction available and Patient being monitored Patient Re-evaluated:Patient Re-evaluated prior to inductionOxygen Delivery Method: Circle system utilized Preoxygenation: Pre-oxygenation with 100% oxygen Intubation Type: IV induction Ventilation: Mask ventilation without difficulty Laryngoscope Size: Mac and 3 Grade View: Grade I Tube type: Oral Endobronchial tube: Double lumen EBT, EBT position confirmed by auscultation  and EBT position confirmed by fiberoptic bronchoscope and 37 Fr Number of attempts: 1 Airway Equipment and Method: Stylet Placement Confirmation: ETT inserted through vocal cords under direct vision,  positive ETCO2 and breath sounds checked- equal and bilateral ETT to lip (cm): inserted to y branch. Tube secured with: Tape Dental Injury: Teeth and Oropharynx as per pre-operative assessment

## 2015-10-05 ENCOUNTER — Inpatient Hospital Stay (HOSPITAL_COMMUNITY): Payer: Medicare Other

## 2015-10-05 ENCOUNTER — Inpatient Hospital Stay (HOSPITAL_COMMUNITY): Payer: Medicare Other | Admitting: Anesthesiology

## 2015-10-05 ENCOUNTER — Encounter (HOSPITAL_COMMUNITY)
Admission: RE | Disposition: A | Discharge status: Home Health | Payer: Self-pay | Source: Ambulatory Visit | Attending: Thoracic Surgery (Cardiothoracic Vascular Surgery)

## 2015-10-05 ENCOUNTER — Encounter (HOSPITAL_COMMUNITY): Payer: Self-pay | Admitting: Thoracic Surgery (Cardiothoracic Vascular Surgery)

## 2015-10-05 DIAGNOSIS — I349 Nonrheumatic mitral valve disorder, unspecified: Secondary | ICD-10-CM

## 2015-10-05 DIAGNOSIS — I97190 Other postprocedural cardiac functional disturbances following cardiac surgery: Secondary | ICD-10-CM

## 2015-10-05 HISTORY — PX: INTRAOPERATIVE TRANSESOPHAGEAL ECHOCARDIOGRAM: SHX5062

## 2015-10-05 HISTORY — PX: WOUND EXPLORATION: SHX6188

## 2015-10-05 LAB — GLUCOSE, CAPILLARY
GLUCOSE-CAPILLARY: 124 mg/dL — AB (ref 65–99)
GLUCOSE-CAPILLARY: 121 mg/dL — AB (ref 65–99)
GLUCOSE-CAPILLARY: 121 mg/dL — AB (ref 65–99)
GLUCOSE-CAPILLARY: 163 mg/dL — AB (ref 65–99)
GLUCOSE-CAPILLARY: 146 mg/dL — AB (ref 65–99)
GLUCOSE-CAPILLARY: 135 mg/dL — AB (ref 65–99)
GLUCOSE-CAPILLARY: 135 mg/dL — AB (ref 65–99)
GLUCOSE-CAPILLARY: 99 mg/dL (ref 65–99)
GLUCOSE-CAPILLARY: 115 mg/dL — AB (ref 65–99)
Glucose-Capillary: 108 mg/dL — ABNORMAL HIGH (ref 65–99)
Glucose-Capillary: 106 mg/dL — ABNORMAL HIGH (ref 65–99)
Glucose-Capillary: 129 mg/dL — ABNORMAL HIGH (ref 65–99)
Glucose-Capillary: 132 mg/dL — ABNORMAL HIGH (ref 65–99)
Glucose-Capillary: 165 mg/dL — ABNORMAL HIGH (ref 65–99)
Glucose-Capillary: 122 mg/dL — ABNORMAL HIGH (ref 65–99)
Glucose-Capillary: 104 mg/dL — ABNORMAL HIGH (ref 65–99)
Glucose-Capillary: 105 mg/dL — ABNORMAL HIGH (ref 65–99)

## 2015-10-05 LAB — BASIC METABOLIC PANEL
Anion gap: 5 (ref 5–15)
BUN: 12 mg/dL (ref 6–20)
CALCIUM: 8.5 mg/dL — AB (ref 8.9–10.3)
CHLORIDE: 111 mmol/L (ref 101–111)
CO2: 24 mmol/L (ref 22–32)
CREATININE: 1.15 mg/dL (ref 0.61–1.24)
GFR calc non Af Amer: 57 mL/min — ABNORMAL LOW (ref >60)
Glucose, Bld: 131 mg/dL — ABNORMAL HIGH (ref 65–99)
Potassium: 4.2 mmol/L (ref 3.5–5.1)
SODIUM: 140 mmol/L (ref 135–145)

## 2015-10-05 LAB — CBC
HEMATOCRIT: 28.1 % — AB (ref 39.0–52.0)
HEMATOCRIT: 25.3 % — AB (ref 39.0–52.0)
HEMATOCRIT: 32.7 % — AB (ref 39.0–52.0)
Hemoglobin: 9.3 g/dL — ABNORMAL LOW (ref 13.0–17.0)
Hemoglobin: 8.3 g/dL — ABNORMAL LOW (ref 13.0–17.0)
Hemoglobin: 10.9 g/dL — ABNORMAL LOW (ref 13.0–17.0)
MCH: 29.9 pg (ref 26.0–34.0)
MCH: 30.1 pg (ref 26.0–34.0)
MCH: 30.1 pg (ref 26.0–34.0)
MCHC: 33.1 g/dL (ref 30.0–36.0)
MCHC: 32.8 g/dL (ref 30.0–36.0)
MCHC: 33.3 g/dL (ref 30.0–36.0)
MCV: 90.4 fL (ref 78.0–100.0)
MCV: 91.7 fL (ref 78.0–100.0)
MCV: 90.3 fL (ref 78.0–100.0)
PLATELETS: 119 10*3/uL — AB (ref 150–400)
Platelets: 111 10*3/uL — ABNORMAL LOW (ref 150–400)
Platelets: 120 10*3/uL — ABNORMAL LOW (ref 150–400)
RBC: 3.11 MIL/uL — ABNORMAL LOW (ref 4.22–5.81)
RBC: 2.76 MIL/uL — ABNORMAL LOW (ref 4.22–5.81)
RBC: 3.62 MIL/uL — AB (ref 4.22–5.81)
RDW: 12.6 % (ref 11.5–15.5)
RDW: 12.8 % (ref 11.5–15.5)
RDW: 13.3 % (ref 11.5–15.5)
WBC: 13.2 10*3/uL — AB (ref 4.0–10.5)
WBC: 11.5 10*3/uL — ABNORMAL HIGH (ref 4.0–10.5)
WBC: 13.2 10*3/uL — AB (ref 4.0–10.5)

## 2015-10-05 LAB — POCT I-STAT, CHEM 8
BUN: 10 mg/dL (ref 6–20)
BUN: 10 mg/dL (ref 6–20)
CALCIUM ION: 1.25 mmol/L (ref 1.13–1.30)
CHLORIDE: 105 mmol/L (ref 101–111)
CREATININE: 0.8 mg/dL (ref 0.61–1.24)
Calcium, Ion: 1.24 mmol/L (ref 1.13–1.30)
Chloride: 104 mmol/L (ref 101–111)
Creatinine, Ser: 0.8 mg/dL (ref 0.61–1.24)
GLUCOSE: 97 mg/dL (ref 65–99)
GLUCOSE: 110 mg/dL — AB (ref 65–99)
HCT: 31 % — ABNORMAL LOW (ref 39.0–52.0)
HEMATOCRIT: 30 % — AB (ref 39.0–52.0)
HEMOGLOBIN: 10.2 g/dL — AB (ref 13.0–17.0)
HEMOGLOBIN: 10.5 g/dL — AB (ref 13.0–17.0)
POTASSIUM: 3.8 mmol/L (ref 3.5–5.1)
Potassium: 3.8 mmol/L (ref 3.5–5.1)
SODIUM: 143 mmol/L (ref 135–145)
Sodium: 140 mmol/L (ref 135–145)
TCO2: 24 mmol/L (ref 0–100)
TCO2: 25 mmol/L (ref 0–100)

## 2015-10-05 LAB — POCT I-STAT 3, ART BLOOD GAS (G3+)
ACID-BASE DEFICIT: 2 mmol/L (ref 0.0–2.0)
ACID-BASE EXCESS: 1 mmol/L (ref 0.0–2.0)
BICARBONATE: 24.3 meq/L — AB (ref 20.0–24.0)
BICARBONATE: 23.3 meq/L (ref 20.0–24.0)
Bicarbonate: 27.2 mEq/L — ABNORMAL HIGH (ref 20.0–24.0)
O2 SAT: 95 %
O2 SAT: 98 %
O2 SAT: 98 %
PCO2 ART: 36.4 mmHg (ref 35.0–45.0)
PH ART: 7.348 — AB (ref 7.350–7.450)
PO2 ART: 112 mmHg — AB (ref 80.0–100.0)
PO2 ART: 90 mmHg (ref 80.0–100.0)
Patient temperature: 35.5
TCO2: 29 mmol/L (ref 0–100)
TCO2: 25 mmol/L (ref 0–100)
TCO2: 24 mmol/L (ref 0–100)
pCO2 arterial: 48.6 mmHg — ABNORMAL HIGH (ref 35.0–45.0)
pCO2 arterial: 37.9 mmHg (ref 35.0–45.0)
pH, Arterial: 7.417 (ref 7.350–7.450)
pH, Arterial: 7.407 (ref 7.350–7.450)
pO2, Arterial: 74 mmHg — ABNORMAL LOW (ref 80.0–100.0)

## 2015-10-05 LAB — APTT: aPTT: 37 seconds (ref 24–37)

## 2015-10-05 LAB — PREPARE RBC (CROSSMATCH)

## 2015-10-05 LAB — PROTIME-INR
INR: 1.65 — ABNORMAL HIGH (ref 0.00–1.49)
Prothrombin Time: 19.5 seconds — ABNORMAL HIGH (ref 11.6–15.2)

## 2015-10-05 LAB — POCT I-STAT 4, (NA,K, GLUC, HGB,HCT)
Glucose, Bld: 109 mg/dL — ABNORMAL HIGH (ref 65–99)
HCT: 31 % — ABNORMAL LOW (ref 39.0–52.0)
Hemoglobin: 10.5 g/dL — ABNORMAL LOW (ref 13.0–17.0)
POTASSIUM: 3.5 mmol/L (ref 3.5–5.1)
SODIUM: 140 mmol/L (ref 135–145)

## 2015-10-05 LAB — MAGNESIUM: Magnesium: 2.5 mg/dL — ABNORMAL HIGH (ref 1.7–2.4)

## 2015-10-05 SURGERY — WOUND EXPLORATION
Anesthesia: General | Site: Chest

## 2015-10-05 MED ORDER — ANTISEPTIC ORAL RINSE SOLUTION (CORINZ)
7.0000 mL | Freq: Four times a day (QID) | OROMUCOSAL | Status: DC
  Administered 2015-10-05 – 2015-10-06 (×4): 7 mL via OROMUCOSAL

## 2015-10-05 MED ORDER — ONDANSETRON HCL 4 MG/2ML IJ SOLN
4.0000 mg | Freq: Four times a day (QID) | INTRAMUSCULAR | Status: DC | PRN
  Administered 2015-10-06: 4 mg via INTRAVENOUS
  Filled 2015-10-05 (×2): qty 2

## 2015-10-05 MED ORDER — ALBUMIN HUMAN 5 % IV SOLN
INTRAVENOUS | Status: AC
  Administered 2015-10-05: 12.5 g
  Filled 2015-10-05: qty 250

## 2015-10-05 MED ORDER — SODIUM CHLORIDE 0.9 % IV SOLN: 250.0000 mL | INTRAVENOUS | Status: DC

## 2015-10-05 MED ORDER — VANCOMYCIN HCL 10 G IV SOLR
1500.0000 mg | INTRAVENOUS | Status: AC
  Administered 2015-10-05: 1500 mg via INTRAVENOUS
  Filled 2015-10-05 (×2): qty 1500

## 2015-10-05 MED ORDER — TRAMADOL HCL 50 MG PO TABS
50.0000 mg | ORAL_TABLET | ORAL | Status: DC | PRN
  Administered 2015-10-06: 50 mg via ORAL
  Administered 2015-10-12 (×2): 100 mg via ORAL
  Filled 2015-10-05 (×3): qty 2

## 2015-10-05 MED ORDER — DEXTROSE 5 % IV SOLN
5.0000 mg/h | INTRAVENOUS | Status: DC
  Administered 2015-10-05: 10 mg/h via INTRAVENOUS
  Filled 2015-10-05 (×2): qty 100

## 2015-10-05 MED ORDER — CHLORHEXIDINE GLUCONATE 0.12% ORAL RINSE (MEDLINE KIT)
15.0000 mL | Freq: Two times a day (BID) | OROMUCOSAL | Status: DC
  Administered 2015-10-05 – 2015-10-06 (×2): 15 mL via OROMUCOSAL

## 2015-10-05 MED ORDER — VECURONIUM BROMIDE 10 MG IV SOLR
INTRAVENOUS | Status: DC | PRN
  Administered 2015-10-05 (×2): 5 mg via INTRAVENOUS
  Administered 2015-10-05: 4 mg via INTRAVENOUS

## 2015-10-05 MED ORDER — DILTIAZEM HCL 100 MG IV SOLR
5.0000 mg/h | INTRAVENOUS | Status: DC
  Administered 2015-10-05: 5 mg/h via INTRAVENOUS
  Filled 2015-10-05: qty 100

## 2015-10-05 MED ORDER — SODIUM CHLORIDE 0.9 % IV SOLN
INTRAVENOUS | Status: AC
  Administered 2015-10-05: 69.8 mL/h via INTRAVENOUS
  Filled 2015-10-05: qty 40

## 2015-10-05 MED ORDER — DEXMEDETOMIDINE HCL IN NACL 400 MCG/100ML IV SOLN
0.1000 ug/kg/h | INTRAVENOUS | Status: AC
  Administered 2015-10-05: .4 ug/kg/h via INTRAVENOUS
  Filled 2015-10-05: qty 100

## 2015-10-05 MED ORDER — DEXTROSE 5 % IV SOLN
1.5000 g | Freq: Two times a day (BID) | INTRAVENOUS | Status: AC
  Administered 2015-10-05 – 2015-10-07 (×4): 1.5 g via INTRAVENOUS
  Filled 2015-10-05 (×4): qty 1.5

## 2015-10-05 MED ORDER — FAMOTIDINE IN NACL 20-0.9 MG/50ML-% IV SOLN
20.0000 mg | Freq: Two times a day (BID) | INTRAVENOUS | Status: AC
  Administered 2015-10-05 – 2015-10-06 (×2): 20 mg via INTRAVENOUS
  Filled 2015-10-05 (×2): qty 50

## 2015-10-05 MED ORDER — PLASMA-LYTE 148 IV SOLN
INTRAVENOUS | Status: DC
  Filled 2015-10-05: qty 2.5

## 2015-10-05 MED ORDER — SODIUM CHLORIDE 0.9 % IR SOLN
Status: DC | PRN
  Administered 2015-10-05: 6000 mL

## 2015-10-05 MED ORDER — VANCOMYCIN HCL IN DEXTROSE 1-5 GM/200ML-% IV SOLN
1000.0000 mg | Freq: Once | INTRAVENOUS | Status: AC
  Administered 2015-10-06: 1000 mg via INTRAVENOUS
  Filled 2015-10-05: qty 200

## 2015-10-05 MED ORDER — MAGNESIUM SULFATE 50 % IJ SOLN
40.0000 meq | INTRAMUSCULAR | Status: DC
  Filled 2015-10-05: qty 10

## 2015-10-05 MED ORDER — VECURONIUM BROMIDE 10 MG IV SOLR
INTRAVENOUS | Status: AC
  Filled 2015-10-05: qty 10

## 2015-10-05 MED ORDER — ACETAMINOPHEN 500 MG PO TABS
1000.0000 mg | ORAL_TABLET | Freq: Four times a day (QID) | ORAL | Status: DC
Start: 2015-10-06 — End: 2015-10-10
  Administered 2015-10-06 – 2015-10-10 (×14): 1000 mg via ORAL
  Filled 2015-10-05 (×20): qty 2

## 2015-10-05 MED ORDER — SODIUM CHLORIDE 0.9 % IV SOLN: INTRAVENOUS | Status: AC

## 2015-10-05 MED ORDER — DEXMEDETOMIDINE HCL IN NACL 200 MCG/50ML IV SOLN
0.0000 ug/kg/h | INTRAVENOUS | Status: DC
  Administered 2015-10-05: 0.4 ug/kg/h via INTRAVENOUS
  Filled 2015-10-05: qty 50

## 2015-10-05 MED ORDER — ACETAMINOPHEN 650 MG RE SUPP
650.0000 mg | Freq: Once | RECTAL | Status: AC
  Administered 2015-10-05: 650 mg via RECTAL

## 2015-10-05 MED ORDER — NITROGLYCERIN IN D5W 200-5 MCG/ML-% IV SOLN
2.0000 ug/min | INTRAVENOUS | Status: DC
  Filled 2015-10-05: qty 250

## 2015-10-05 MED ORDER — PHENYLEPHRINE HCL 10 MG/ML IJ SOLN
0.0000 ug/min | INTRAVENOUS | Status: DC
  Administered 2015-10-06: 40 ug/min via INTRAVENOUS
  Administered 2015-10-06: 15 ug/min via INTRAVENOUS
  Filled 2015-10-05 (×2): qty 2

## 2015-10-05 MED ORDER — DOPAMINE-DEXTROSE 3.2-5 MG/ML-% IV SOLN
0.0000 ug/kg/min | INTRAVENOUS | Status: DC
  Filled 2015-10-05: qty 250

## 2015-10-05 MED ORDER — LACTATED RINGERS IV SOLN
INTRAVENOUS | Status: DC
  Administered 2015-10-05: 18:00:00 via INTRAVENOUS

## 2015-10-05 MED ORDER — MORPHINE SULFATE (PF) 2 MG/ML IV SOLN
1.0000 mg | INTRAVENOUS | Status: AC | PRN
  Filled 2015-10-05: qty 2

## 2015-10-05 MED ORDER — MIDAZOLAM HCL 5 MG/5ML IJ SOLN
INTRAMUSCULAR | Status: DC | PRN
  Administered 2015-10-05 (×2): 2 mg via INTRAVENOUS

## 2015-10-05 MED ORDER — SODIUM CHLORIDE 0.9 % IV SOLN
INTRAVENOUS | Status: DC
  Filled 2015-10-05: qty 2.5

## 2015-10-05 MED ORDER — SODIUM CHLORIDE 0.9% FLUSH: 3.0000 mL | INTRAVENOUS | Status: DC | PRN

## 2015-10-05 MED ORDER — DEXTROSE 5 % IV SOLN
750.0000 mg | INTRAVENOUS | Status: DC
  Filled 2015-10-05: qty 750

## 2015-10-05 MED ORDER — SODIUM CHLORIDE 0.9 % IV SOLN: INTRAVENOUS | Status: DC

## 2015-10-05 MED ORDER — ASPIRIN EC 325 MG PO TBEC
325.0000 mg | DELAYED_RELEASE_TABLET | Freq: Every day | ORAL | Status: DC
  Administered 2015-10-06 – 2015-10-07 (×2): 325 mg via ORAL
  Filled 2015-10-05 (×2): qty 1

## 2015-10-05 MED ORDER — INSULIN REGULAR BOLUS VIA INFUSION
0.0000 [IU] | Freq: Three times a day (TID) | INTRAVENOUS | Status: DC
  Filled 2015-10-05: qty 10

## 2015-10-05 MED ORDER — METOPROLOL TARTRATE 25 MG PO TABS
25.0000 mg | ORAL_TABLET | Freq: Four times a day (QID) | ORAL | Status: DC
Start: 2015-10-05 — End: 2015-10-06
  Administered 2015-10-06: 25 mg via ORAL
  Filled 2015-10-05: qty 1

## 2015-10-05 MED ORDER — SODIUM CHLORIDE 0.9 % IV SOLN
INTRAVENOUS | Status: DC
  Filled 2015-10-05: qty 30

## 2015-10-05 MED ORDER — STERILE WATER FOR INJECTION IJ SOLN
INTRAMUSCULAR | Status: AC
  Filled 2015-10-05: qty 10

## 2015-10-05 MED ORDER — PHENYLEPHRINE HCL 10 MG/ML IJ SOLN
30.0000 ug/min | INTRAVENOUS | Status: DC
  Filled 2015-10-05: qty 2

## 2015-10-05 MED ORDER — POTASSIUM CHLORIDE 2 MEQ/ML IV SOLN
80.0000 meq | INTRAVENOUS | Status: DC
  Filled 2015-10-05: qty 40

## 2015-10-05 MED ORDER — CHLORHEXIDINE GLUCONATE 0.12 % MT SOLN
15.0000 mL | OROMUCOSAL | Status: AC
  Administered 2015-10-05: 15 mL via OROMUCOSAL

## 2015-10-05 MED ORDER — MORPHINE SULFATE (PF) 2 MG/ML IV SOLN: 2.0000 mg | INTRAVENOUS | Status: DC | PRN

## 2015-10-05 MED ORDER — METOPROLOL TARTRATE 1 MG/ML IV SOLN: 2.5000 mg | INTRAVENOUS | Status: DC | PRN

## 2015-10-05 MED ORDER — ACETAMINOPHEN 160 MG/5ML PO SOLN
1000.0000 mg | Freq: Four times a day (QID) | ORAL | Status: DC
  Administered 2015-10-05: 1000 mg
  Filled 2015-10-05: qty 40.6

## 2015-10-05 MED ORDER — PANTOPRAZOLE SODIUM 40 MG PO TBEC
40.0000 mg | DELAYED_RELEASE_TABLET | Freq: Every day | ORAL | Status: DC
  Administered 2015-10-07 – 2015-10-13 (×7): 40 mg via ORAL
  Filled 2015-10-05 (×2): qty 1
  Filled 2015-10-05: qty 2
  Filled 2015-10-05 (×5): qty 1

## 2015-10-05 MED ORDER — SODIUM CHLORIDE 0.9 % IV SOLN
Freq: Once | INTRAVENOUS | Status: AC
  Administered 2015-10-05: 08:00:00 via INTRAVENOUS

## 2015-10-05 MED ORDER — ASPIRIN 81 MG PO CHEW: 324.0000 mg | CHEWABLE_TABLET | Freq: Every day | ORAL | Status: DC

## 2015-10-05 MED ORDER — SODIUM CHLORIDE 0.45 % IV SOLN: INTRAVENOUS | Status: DC | PRN

## 2015-10-05 MED ORDER — POTASSIUM CHLORIDE 10 MEQ/50ML IV SOLN
INTRAVENOUS | Status: AC
  Filled 2015-10-05: qty 200

## 2015-10-05 MED ORDER — VANCOMYCIN HCL 1000 MG IV SOLR
INTRAVENOUS | Status: AC
  Administered 2015-10-05: 1000 mL
  Filled 2015-10-05: qty 1000

## 2015-10-05 MED ORDER — EPINEPHRINE HCL 1 MG/ML IJ SOLN
0.0000 ug/min | INTRAVENOUS | Status: DC
  Filled 2015-10-05: qty 4

## 2015-10-05 MED ORDER — DEXTROSE 5 % IV SOLN
0.0000 ug/min | INTRAVENOUS | Status: DC
  Filled 2015-10-05: qty 4

## 2015-10-05 MED ORDER — OXYCODONE HCL 5 MG PO TABS: 5.0000 mg | ORAL_TABLET | ORAL | Status: DC | PRN

## 2015-10-05 MED ORDER — LACTATED RINGERS IV SOLN: 500.0000 mL | Freq: Once | INTRAVENOUS | Status: DC | PRN

## 2015-10-05 MED ORDER — MIDAZOLAM HCL 2 MG/2ML IJ SOLN
2.0000 mg | INTRAMUSCULAR | Status: DC | PRN
  Filled 2015-10-05: qty 2

## 2015-10-05 MED ORDER — FENTANYL CITRATE (PF) 250 MCG/5ML IJ SOLN
INTRAMUSCULAR | Status: AC
  Filled 2015-10-05: qty 25

## 2015-10-05 MED ORDER — ACETAMINOPHEN 160 MG/5ML PO SOLN: 650.0000 mg | Freq: Once | ORAL | Status: AC

## 2015-10-05 MED ORDER — SODIUM CHLORIDE 0.9% FLUSH
3.0000 mL | Freq: Two times a day (BID) | INTRAVENOUS | Status: DC
  Administered 2015-10-07 – 2015-10-12 (×4): 3 mL via INTRAVENOUS

## 2015-10-05 MED ORDER — BISACODYL 10 MG RE SUPP: 10.0000 mg | Freq: Every day | RECTAL | Status: DC

## 2015-10-05 MED ORDER — ALBUMIN HUMAN 5 % IV SOLN: 250.0000 mL | INTRAVENOUS | Status: AC | PRN

## 2015-10-05 MED ORDER — SODIUM CHLORIDE 0.9 % IV SOLN
INTRAVENOUS | Status: DC
  Administered 2015-10-05: 20:00:00 via INTRAVENOUS
  Filled 2015-10-05: qty 2.5

## 2015-10-05 MED ORDER — METOPROLOL TARTRATE 25 MG/10 ML ORAL SUSPENSION
25.0000 mg | Freq: Four times a day (QID) | ORAL | Status: DC
  Administered 2015-10-05 (×2): 25 mg
  Filled 2015-10-05 (×2): qty 10

## 2015-10-05 MED ORDER — MAGNESIUM SULFATE 4 GM/100ML IV SOLN
4.0000 g | Freq: Once | INTRAVENOUS | Status: AC
  Administered 2015-10-05: 4 g via INTRAVENOUS
  Filled 2015-10-05: qty 100

## 2015-10-05 MED ORDER — BISACODYL 5 MG PO TBEC
10.0000 mg | DELAYED_RELEASE_TABLET | Freq: Every day | ORAL | Status: DC
  Administered 2015-10-07 – 2015-10-12 (×5): 10 mg via ORAL
  Filled 2015-10-05 (×6): qty 2

## 2015-10-05 MED ORDER — NITROGLYCERIN IN D5W 200-5 MCG/ML-% IV SOLN: 0.0000 ug/min | INTRAVENOUS | Status: DC

## 2015-10-05 MED ORDER — MIDAZOLAM HCL 10 MG/2ML IJ SOLN
INTRAMUSCULAR | Status: AC
  Filled 2015-10-05: qty 2

## 2015-10-05 MED ORDER — LACTATED RINGERS IV SOLN: INTRAVENOUS | Status: DC

## 2015-10-05 MED ORDER — DOCUSATE SODIUM 100 MG PO CAPS
200.0000 mg | ORAL_CAPSULE | Freq: Every day | ORAL | Status: DC
  Administered 2015-10-07 – 2015-10-13 (×6): 200 mg via ORAL
  Filled 2015-10-05 (×7): qty 2

## 2015-10-05 MED ORDER — POTASSIUM CHLORIDE 10 MEQ/50ML IV SOLN
10.0000 meq | INTRAVENOUS | Status: AC
  Administered 2015-10-05 (×3): 10 meq via INTRAVENOUS

## 2015-10-05 MED ORDER — FENTANYL CITRATE (PF) 100 MCG/2ML IJ SOLN
INTRAMUSCULAR | Status: DC | PRN
  Administered 2015-10-05: 250 ug via INTRAVENOUS

## 2015-10-05 MED FILL — Potassium Chloride Inj 2 mEq/ML: INTRAVENOUS | Qty: 40 | Status: AC

## 2015-10-05 MED FILL — Magnesium Sulfate Inj 50%: INTRAMUSCULAR | Qty: 10 | Status: AC

## 2015-10-05 MED FILL — Heparin Sodium (Porcine) Inj 1000 Unit/ML: INTRAMUSCULAR | Qty: 30 | Status: AC

## 2015-10-05 MED FILL — Dexmedetomidine HCl in NaCl 0.9% IV Soln 400 MCG/100ML: INTRAVENOUS | Qty: 100 | Status: AC

## 2015-10-05 SURGICAL SUPPLY — 86 items
ADAPTER CARDIO PERF ANTE/RETRO (ADAPTER) ×3 IMPLANT
APPLICATOR COTTON TIP 6IN STRL (MISCELLANEOUS) IMPLANT
ATTRACTOMAT 16X20 MAGNETIC DRP (DRAPES) ×3 IMPLANT
BAG DECANTER FOR FLEXI CONT (MISCELLANEOUS) ×3 IMPLANT
BLADE CORE FAN STRYKER (BLADE) ×6 IMPLANT
BLADE OSCILLATING /SAGITTAL (BLADE) ×3 IMPLANT
BLADE STERNUM SYSTEM 6 (BLADE) ×3 IMPLANT
BLADE SURG 11 STRL SS (BLADE) ×3 IMPLANT
CANISTER SUCTION 2500CC (MISCELLANEOUS) ×3 IMPLANT
CANN PRFSN 3/8X14X24FR PCFC (MISCELLANEOUS)
CANN PRFSN 3/8XCNCT ST RT ANG (MISCELLANEOUS)
CANNULA EZ GLIDE AORTIC 21FR (CANNULA) ×3 IMPLANT
CANNULA FEM VENOUS REMOTE 22FR (CANNULA) ×3 IMPLANT
CANNULA GUNDRY RCSP 15FR (MISCELLANEOUS) ×3 IMPLANT
CANNULA PRFSN 3/8X14X24FR PCFC (MISCELLANEOUS) IMPLANT
CANNULA PRFSN 3/8XCNCT RT ANG (MISCELLANEOUS) IMPLANT
CANNULA VEN MTL TIP RT (MISCELLANEOUS)
CATH ROBINSON RED A/P 18FR (CATHETERS) IMPLANT
CATH THORACIC 28FR RT ANG (CATHETERS) IMPLANT
CATH THORACIC 36FR (CATHETERS) ×3 IMPLANT
CLIP FOGARTY SPRING 6M (CLIP) IMPLANT
CONN 1/2X1/2X1/2  BEN (MISCELLANEOUS) ×1
CONN 1/2X1/2X1/2 BEN (MISCELLANEOUS) ×2 IMPLANT
CONN 3/8X1/2 ST GISH (MISCELLANEOUS) ×6 IMPLANT
COVER SURGICAL LIGHT HANDLE (MISCELLANEOUS) ×6 IMPLANT
CRADLE DONUT ADULT HEAD (MISCELLANEOUS) ×3 IMPLANT
DRAIN CHANNEL 32F RND 10.7 FF (WOUND CARE) ×3 IMPLANT
DRAPE INCISE IOBAN 66X45 STRL (DRAPES) ×3 IMPLANT
DRAPE SLUSH/WARMER DISC (DRAPES) ×3 IMPLANT
DRSG AQUACEL AG ADV 3.5X14 (GAUZE/BANDAGES/DRESSINGS) ×3 IMPLANT
DRSG COVADERM 4X14 (GAUZE/BANDAGES/DRESSINGS) ×3 IMPLANT
ELECT REM PT RETURN 9FT ADLT (ELECTROSURGICAL) ×6
ELECTRODE REM PT RTRN 9FT ADLT (ELECTROSURGICAL) ×4 IMPLANT
FELT TEFLON 1X6 (MISCELLANEOUS) ×3 IMPLANT
GAUZE SPONGE 4X4 12PLY STRL (GAUZE/BANDAGES/DRESSINGS) ×6 IMPLANT
GLOVE BIO SURGEON STRL SZ 6 (GLOVE) IMPLANT
GLOVE BIO SURGEON STRL SZ 6.5 (GLOVE) IMPLANT
GLOVE BIO SURGEON STRL SZ7 (GLOVE) IMPLANT
GLOVE BIO SURGEON STRL SZ7.5 (GLOVE) IMPLANT
GLOVE ORTHO TXT STRL SZ7.5 (GLOVE) ×6 IMPLANT
GOWN STRL REUS W/ TWL LRG LVL3 (GOWN DISPOSABLE) ×8 IMPLANT
GOWN STRL REUS W/TWL LRG LVL3 (GOWN DISPOSABLE) ×4
HEMOSTAT POWDER SURGIFOAM 1G (HEMOSTASIS) IMPLANT
INSERT FOGARTY XLG (MISCELLANEOUS) ×3 IMPLANT
KIT BASIN OR (CUSTOM PROCEDURE TRAY) ×3 IMPLANT
KIT DRAINAGE VACCUM ASSIST (KITS) ×3 IMPLANT
KIT ROOM TURNOVER OR (KITS) ×3 IMPLANT
KIT SUCTION CATH 14FR (SUCTIONS) ×9 IMPLANT
LINE VENT (MISCELLANEOUS) ×3 IMPLANT
MARKER GRAFT CORONARY BYPASS (MISCELLANEOUS) IMPLANT
NS IRRIG 1000ML POUR BTL (IV SOLUTION) ×12 IMPLANT
PACK OPEN HEART (CUSTOM PROCEDURE TRAY) ×3 IMPLANT
PAD ARMBOARD 7.5X6 YLW CONV (MISCELLANEOUS) ×6 IMPLANT
PAD ELECT DEFIB RADIOL ZOLL (MISCELLANEOUS) ×3 IMPLANT
SET CARDIOPLEGIA MPS 5001102 (MISCELLANEOUS) ×3 IMPLANT
SET IRRIG TUBING LAPAROSCOPIC (IRRIGATION / IRRIGATOR) ×3 IMPLANT
SUCKER INTRACARDIAC WEIGHTED (SUCKER) ×3 IMPLANT
SUT BONE WAX W31G (SUTURE) ×3 IMPLANT
SUT ETHIBOND 2 0 SH (SUTURE) ×2 IMPLANT
SUT ETHIBOND 2 0 SH 36X2 (SUTURE) ×4 IMPLANT
SUT ETHIBOND 2 0 V4 (SUTURE) IMPLANT
SUT ETHIBOND 2 0V4 GREEN (SUTURE) IMPLANT
SUT ETHIBOND 4 0 TF (SUTURE) IMPLANT
SUT ETHIBOND 5 0 C 1 30 (SUTURE) ×3 IMPLANT
SUT ETHIBOND X763 2 0 SH 1 (SUTURE) ×6 IMPLANT
SUT MNCRL AB 3-0 PS2 18 (SUTURE) ×6 IMPLANT
SUT PDS AB 1 CTX 36 (SUTURE) ×6 IMPLANT
SUT PROLENE 3 0 SH 1 (SUTURE) ×3 IMPLANT
SUT PROLENE 3 0 SH DA (SUTURE) ×3 IMPLANT
SUT PROLENE 4 0 RB 1 (SUTURE) ×4
SUT PROLENE 4 0 SH DA (SUTURE) ×9 IMPLANT
SUT PROLENE 4-0 RB1 .5 CRCL 36 (SUTURE) ×8 IMPLANT
SUT SILK  1 MH (SUTURE)
SUT SILK 1 MH (SUTURE) IMPLANT
SUT STEEL 6MS V (SUTURE) IMPLANT
SUT STEEL STERNAL CCS#1 18IN (SUTURE) IMPLANT
SUT STEEL SZ 6 DBL 3X14 BALL (SUTURE) IMPLANT
SUT VIC AB 2-0 CTX 27 (SUTURE) IMPLANT
SYSTEM SAHARA CHEST DRAIN ATS (WOUND CARE) ×3 IMPLANT
TOWEL OR 17X24 6PK STRL BLUE (TOWEL DISPOSABLE) ×6 IMPLANT
TOWEL OR 17X26 10 PK STRL BLUE (TOWEL DISPOSABLE) ×6 IMPLANT
TRAY FOLEY IC TEMP SENS 14FR (CATHETERS) IMPLANT
TRAY FOLEY IC TEMP SENS 16FR (CATHETERS) ×3 IMPLANT
TUBING INSUFFLATION 10FT LAP (TUBING) ×3 IMPLANT
UNDERPAD 30X30 INCONTINENT (UNDERPADS AND DIAPERS) ×3 IMPLANT
WATER STERILE IRR 1000ML POUR (IV SOLUTION) ×6 IMPLANT

## 2015-10-05 NOTE — OR Nursing (Signed)
Procedure for mitral repair vs replacement had been changed to sternal incsion without sternotomy per Dr. Roxy Manns and Dr. Aundra Dubin.

## 2015-10-05 NOTE — Progress Notes (Signed)
*  PRELIMINARY RESULTS* Echocardiogram 2D Echocardiogram has been performed.  Leavy Cella 10/05/2015, 10:19 AM

## 2015-10-05 NOTE — Progress Notes (Signed)
UR Completed. Xzaviar Maloof, RN, BSN.  [phone] 

## 2015-10-05 NOTE — Op Note (Addendum)
CARDIOTHORACIC SURGERY OPERATIVE NOTE  Date of Procedure:   10/05/2015  Preoperative Diagnosis:    Systolic anterior motion of mitral valve with dynamic LV outflow tract obstruction S/P mitral valve repair  Postoperative Diagnosis:    Resolved systolic anterior motion of mitral valve with dynamic LV outflow tract obstruction S/P mitral valve repair  Procedure:      Sternal incision without sternotomy  Transesophageal echocardiogram  Surgeon:    Valentina Gu. Roxy Manns, MD  Assistant:    Alcide Evener, CRNFA  Anesthesia:    Albertha Ghee, MD  Cardiologist:    [name], MD  Operative Findings:   No significant LV outflow tract obstruction  No residual mitral regurgitation  No ongoing systolic anterior motion of the mitral valve     BRIEF CLINICAL NOTE AND INDICATIONS FOR SURGERY  Patient is an 82 year old male who underwent mitral valve repair on 10/04/2015 for severe symptomatic primary mitral regurgitation. Intraoperatively the patient had transient dynamic left ventricular outflow tract obstruction with systolic anterior motion of the mitral valve and recurrent mitral regurgitation after separation from cardioplegia bypass. Hemodynamics improved and left ventricular output tract obstruction appeared to resolve with adjustments to medical therapy and volume loading. The patient's initial postoperative recovery was uneventful. On the early morning of 10/05/2015 the patient developed hypotension with sudden increased in pulmonary artery pressures. By morning rounds patient was hypotensive on intravenous norepinephrine drip with elevated PA pressures and a prominent systolic murmur on physical exam. Milrinone infusion was discontinued and the patient was given intravenous metoprolol. Hemodynamics improved. The patient was started on diltiazem drip. Despite optimization and hemodynamics a follow-up transthoracic echocardiogram suggested the presence of persistent significant left  ventricular outflow tract obstruction. The patient's wife was counseled regarding options and a plan was made to bring the patient back to the operating room for possible surgical intervention.     DETAILS OF THE OPERATIVE PROCEDURE  The patient is brought to the operating room on the above mentioned date And placed in the supine position on the operative table. Adequate general anesthesia is verified under the care and direction of Dr. Marcie Bal. Intravenous antibiotic administered. The patient's anterior chest, abdomen, both groins, both lower extremities are prepared and draped in a sterile manner.  The midline sternal incision is performed. The sternum is not yet opened.  At this point further surgical intervention is held pending results of transesophageal echocardiogram.  Transesophageal echocardiogram is performed by Dr. Marcie Bal.  The patient has severe left ventricular hypertrophy with normal left ventricular systolic function. There is mitral annuloplasty ring in the mitral position with normal functioning mitral valve. There is no mitral regurgitation. There does not appear to be significant left ventricular output tract obstruction. There is no sign of significant systolic anterior motion of the mitral valve.  Intraoperative consultation is obtained with  Dr. Aundra Dubin from cardiology. Repeat images from transesophageal echocardiogram are performed. With careful assessment it appears clear that the mitral valve appears intact, there is no sign of ongoing systolic anterior motion of the mitral valve, and there is no significant gradient across the left ventricular output tract. Further surgical intervention is felt not warranted.  The patient's midline chest incision is closed in multiple layers in routine fashion. The patient is transported back to the surgical intensive care unit in stable condition. There were no intraoperative, occasions. All sponge instrument and needle counts are verified  correct. Estimated blood loss was trivial.    Valentina Gu. Roxy Manns MD 10/05/2015 5:33 PM

## 2015-10-05 NOTE — Progress Notes (Addendum)
      [address]       [place] [address]             [phone]        CARDIOTHORACIC SURGERY PROGRESS NOTE   R1 Day Post-Op Procedure(s) (LRB): MINIMALLY INVASIVE MITRAL VALVE REPAIR (MVR) (Right) TRANSESOPHAGEAL ECHOCARDIOGRAM (TEE) (N/A)  Subjective: Lightly sedated on vent.  Follows commands.  Looks comfortable.  Objective: Vital signs: BP Readings from Last 1 Encounters:  10/05/15 76/65   Pulse Readings from Last 1 Encounters:  10/05/15 70   Resp Readings from Last 1 Encounters:  10/05/15 19   Temp Readings from Last 1 Encounters:  10/05/15 99.1 F (37.3 C)     Hemodynamics: PAP: (30-56)/(11-18) 54/16 mmHg CO:  [4.4 L/min-5.3 L/min] 4.4 L/min CI:  [2.1 L/min/m2-2.5 L/min/m2] 2.1 L/min/m2  Physical Exam:  Rhythm:   Accelerated junctional w/ HR 80 - overriding pacemaker  Breath sounds: Coarse but clear  Heart sounds:  RRR w/ systolic murmur LSB  Incisions:  Dressings dry, intace  Abdomen:  Soft, non-distended, non-tender  Extremities:  Warm, well-perfused  Chest tubes:  Low volume thin serosanguinous output, no air leak    Intake/Output from previous day: 03/01 0701 - 03/02 0700 In: 8338.5 [I.V.:4623.5; Blood:835; NG/GT:30; IV [id number] Out: [id number] [Urine:3210; Blood:2000; Chest Tube:840] Intake/Output this shift:    Lab Results:  CBC: Recent Labs  10/04/15 2300 10/04/15 2308 10/05/15 0400  WBC 13.2*  --  11.5*  HGB 9.3* 8.8* 8.3*  HCT 28.1* 26.0* 25.3*  PLT 119*  --  111*    BMET:  Recent Labs  10/02/15 1308  10/04/15 2308 10/05/15 0400  NA 135  < > 140 140  K 3.0*  < > 3.7 4.2  CL 97*  < > 105 111  CO2 24  --   --  24  GLUCOSE 235*  < > 116* 131*  BUN 14  < > 13 12  CREATININE 1.18  < > 1.00 1.15  CALCIUM 8.7*  --   --  8.5*  < > = values in this interval not displayed.   PT/INR:   Recent Labs  10/04/15 1725  LABPROT 20.5*  INR 1.76*    CBG (last 3)   Recent Labs  10/05/15 0309  10/05/15 0401 10/05/15 0458  GLUCAP 129* 124* 121*    ABG    Component Value Date/Time   PHART 7.417 10/05/2015 0407   PCO2ART 37.9 10/05/2015 0407   PO2ART 112.0* 10/05/2015 0407   HCO3 24.3* 10/05/2015 0407   TCO2 25 10/05/2015 0407   O2SAT 98.0 10/05/2015 0407    CXR: Mild-moderate diffuse opacity right lung w/ improved atelectasis, left lung fairly clear   Assessment/Plan: S/P Procedure(s) (LRB): MINIMALLY INVASIVE MITRAL VALVE REPAIR (MVR) (Right) TRANSESOPHAGEAL ECHOCARDIOGRAM (TEE) (N/A)  Patient now has hemodynamics suggestive of recurrent SAM with decreased systolic BP on IV levophed, increased pulmonary hypertension, and systolic murmur on exam  Check bedside ECHO STAT Will stop IV milrinone and try IV beta blocker +/- diltiazem Transfuse 2 units PRBC's for acute blood loss anemia and volume load  Will need to consider return to OR if hemodynamics don't return to normal quickly w/ volume loading and negative inotropic agents  Keep on vent for now   Rexene Alberts, MD 10/05/2015 8:00 AM

## 2015-10-05 NOTE — Anesthesia Preprocedure Evaluation (Signed)
Anesthesia Evaluation  Patient identified by MRN, date of birth, ID bandGeneral Assessment Comment:Pt sedated and intubated.  Reviewed: Allergy & Precautions, H&P , NPO status , Patient's Chart, lab work & pertinent test results  History of Anesthesia Complications Negative for: history of anesthetic complications  Airway Mallampati: Intubated  TM Distance: >3 FB     Dental no notable dental hx. (+) Edentulous Upper, Edentulous Lower   Pulmonary former smoker,    Pulmonary exam normal breath sounds clear to auscultation       Cardiovascular hypertension, Pt. on medications and Pt. on home beta blockers + CAD, + Past MI, + Cardiac Stents and +CHF  Normal cardiovascular exam+ dysrhythmias Supra Ventricular Tachycardia + Valvular Problems/Murmurs MR  Rhythm:regular Rate:Normal + Systolic murmurs Pt is POD #1 s/p MVr.  Currently doing poorly with SAM.  S/P stents to LAD and RCA   Neuro/Psych PSYCHIATRIC DISORDERS negative neurological ROS     GI/Hepatic negative GI ROS, Neg liver ROS,   Endo/Other  negative endocrine ROSdiabetes, Well Controlled, Type 2Morbid obesity  Renal/GU Renal InsufficiencyRenal disease     Musculoskeletal  (+) Arthritis ,   Abdominal   Peds  Hematology  (+) anemia ,   Anesthesia Other Findings History of ACDF at C4-C5  Reproductive/Obstetrics negative OB ROS                             Anesthesia Physical  Anesthesia Plan  ASA: IV  Anesthesia Plan: General   Post-op Pain Management:    Induction: Intravenous  Airway Management Planned: Oral ETT  Additional Equipment: 3D TEE, TEE, PA Cath, Arterial line and CVP  Intra-op Plan:   Post-operative Plan: Post-operative intubation/ventilation  Informed Consent: I have reviewed the patients History and Physical, chart, labs and discussed the procedure including the risks, benefits and alternatives for the proposed  anesthesia with the patient or authorized representative who has indicated his/her understanding and acceptance.     Plan Discussed with: Anesthesiologist, CRNA and Surgeon  Anesthesia Plan Comments: (Left IJ double stick cordis and double lumen central line, left radial A line  Does have preserved EF, severe MR, is obese, renal function preserved  Starting Hct is 37%, Type and screen active, does have baseline metabolic alkalosis likely from lasix to help with volume overload from prolapsed anterior mitral leaflet  B Judd)        Anesthesia Quick Evaluation

## 2015-10-05 NOTE — Progress Notes (Signed)
[facility] BRIEF SICU PROGRESS NOTE  1 Day Post-Op  S/P Procedure(s) (LRB): MINIMALLY INVASIVE MITRAL VALVE REPAIR (MVR) (Right) TRANSESOPHAGEAL ECHOCARDIOGRAM (TEE) (N/A)   Mr Quain's hemodynamics improved fairly dramatically with stopping milrinone, IV metoprolol bolus, AAI pacing once metoprolol slowed HR to < 60 bpm, continuous diltiazem drip, and volume loading with PRBC's.  Systemic pressure improved and PA pressure fell.  However, bedside 2D ECHO reveals persistent systolic anterior motion of the mitral valve with dynamic LV outflow tract obstruction.  Under the circumstances I feel that we have tried everything w/ medical Rx and have no choice but to return to OR.    Plan: Return to OR as soon as possible today for redo mitral valve repair or replacement.  I have explained the circumstances to the best of my ability to the patient's wife at the bedside.  She provides full informed consent for the procedure.  All questions answered.   Rexene Alberts, MD 10/05/2015 3:01 PM

## 2015-10-05 NOTE — Transfer of Care (Signed)
Immediate Anesthesia Transfer of Care Note  Patient: [name]  Procedure(s) Performed: Procedure(s) with comments: STERNOTOMY (N/A) - sternal incsion without sternotomy  Patient Location: SICU  Anesthesia Type:General  Level of Consciousness: sedated, unresponsive and Patient remains intubated per anesthesia plan  Airway & Oxygen Therapy: Patient remains intubated per anesthesia plan and Patient placed on Ventilator (see vital sign flow sheet for setting)  Post-op Assessment: Report given to RN and Post -op Vital signs reviewed and stable  Post vital signs: Reviewed and stable  Last Vitals:  Filed Vitals:   10/05/15 1445 10/05/15 1500  BP:  125/75  Pulse: 70 69  Temp: 36.8 C   Resp: 16 0    Complications: No apparent anesthesia complications

## 2015-10-05 NOTE — Progress Notes (Signed)
Patient arrived back from OR and placed on previous vent settings with no complications. Nurse is aware. Will continue to monitor pt.

## 2015-10-05 NOTE — Progress Notes (Signed)
Each time pt wakes up, BP drops to 80/40, as pt has become more awake with sedation weaned off, pressor support has needed to be increased. (now on 80 mcg/min of Phenylephrine). Pt still too sleepy to safely wean off vent not initiating his own breaths frequently enough. Will continue to monitor and assess for readiness to wean.

## 2015-10-05 NOTE — Progress Notes (Signed)
Initial Nutrition Assessment  DOCUMENTATION CODES:   Obesity unspecified  INTERVENTION:   - If unable to extubate, recommend initiating tube feeding when medically appropriate with Vital High Protein at 20 ml/hr to increase by 10 ml every 4 hours to a goal rate of 50 ml/hr.  Provide 30 mL Prostat BID.  Tube feeding regimen would provide 1400 kcals, 135 grams of protein, and 973 ml free water.  NUTRITION DIAGNOSIS:   Inadequate oral intake related to inability to eat as evidenced by NPO status.  GOAL:   Provide needs based on ASPEN/SCCM guidelines  MONITOR:   Vent status, Labs, Weight trends, Skin, I & O's  REASON FOR ASSESSMENT:   Ventilator    ASSESSMENT:   82 yo male with long-standing history of mitral valve prolapse with mitral regurgitation and chronic diastolic CHF, CAD status post PCI and stenting of the LAD and RCA using drug-eluting stents, recurrent SVT, pulmonary HTN, T2DM, hyperlipidemia, and chronic back pain who was schedule to have a mitral valve repair.    S/P procedure 3/1: Minimally-Invasive Mitral Valve Repair  Patient is currently intubated on ventilator support. MV: 8.8 ml/min Temp (24hrs), Avg:98 F (36.7 C), Min:95.7 F (35.4 C), Max:99.5 F (37.5 C) OG Tube, tip in stomach  Nutrition Focused Physical Exam was conducted.  Findings include mild to moderate fat depletion, mild to moderate muscle depletion, and no edema.  Spoke to pt's wife who reports that pt weighed 238 lb a few months ago, but has lost 24# to a new weight of 214 lbs.  States that patient has been eating a few smalls meals through out the day while trying to lose weight loss.  Per RN patient will remain intubated and is going back to the OR for further mitral valve repair.    Medications reviewed.  Labs reviewed: elevated magnesium (2.5), glucose (116 - 131).  Diet Order:  Diet NPO time specified  Skin:  Reviewed, no issues (Closed chest incision)  Last BM:   Unknown  Height:   Ht Readings from Last 1 Encounters:  10/02/15 5\' 7"  (1.702 m)    Weight:   Wt Readings from Last 1 Encounters:  10/05/15 229 lb 8 oz (104.1 kg)    Ideal Body Weight:  67.3 kg  BMI:  Body mass index is 35.94 kg/(m^2).  Estimated Nutritional Needs:   Kcal:  [id number] - [id number]  Protein:  >/= 135 grams  Fluid:  >/= 1.5 L  EDUCATION NEEDS:   No education needs identified at this time  Veronda Prude, Dietetic Intern Pager: [phone]

## 2015-10-05 NOTE — Progress Notes (Signed)
CT surgery p.m. Rounds  Patient taken back to the OR for assessment of SAM of his mitral valve repair. Intraoperative TEE showed no evidence of transvalvular aortic gradient or SAM so procedure terminated Patient waking up from anesthesia and plan is to extubate when he meets criteria No murmur present this p.m. Avoiding norepinephrine.  Tharon Aquas trigt M.D. T CTS

## 2015-10-05 NOTE — Progress Notes (Signed)
RT NOTE:  Rapid Wean started 

## 2015-10-05 NOTE — Progress Notes (Signed)
  Echocardiogram Echocardiogram Transesophageal has been performed.  [name] 10/05/2015, 5:08 PM

## 2015-10-05 NOTE — Progress Notes (Signed)
EKG CRITICAL VALUE     12 lead EKG performed.  Critical value noted.  Kathlee Nations, RN notified.   Neva Seat, CCT 10/05/2015 7:11 AM

## 2015-10-05 NOTE — Anesthesia Postprocedure Evaluation (Signed)
Anesthesia Post Note  Patient: [name]  Procedure(s) Performed: Procedure(s) (LRB): STERNOTOMY (N/A)  Patient location during evaluation: SICU Anesthesia Type: General Level of consciousness: sedated Pain management: pain level controlled Vital Signs Assessment: post-procedure vital signs reviewed and stable Respiratory status: patient remains intubated per anesthesia plan Cardiovascular status: stable Anesthetic complications: no    Last Vitals:  Filed Vitals:   10/05/15 1920 10/05/15 1930  BP: 135/63 120/74  Pulse: 66 66  Temp:  35.4 C  Resp:  16    Last Pain: There were no vitals filed for this visit.               Zenaida Deed

## 2015-10-06 ENCOUNTER — Encounter (HOSPITAL_COMMUNITY): Payer: Self-pay | Admitting: Thoracic Surgery (Cardiothoracic Vascular Surgery)

## 2015-10-06 ENCOUNTER — Inpatient Hospital Stay (HOSPITAL_COMMUNITY): Payer: Medicare Other

## 2015-10-06 DIAGNOSIS — I952 Hypotension due to drugs: Secondary | ICD-10-CM

## 2015-10-06 LAB — POCT I-STAT, CHEM 8
BUN: 11 mg/dL (ref 6–20)
CHLORIDE: 106 mmol/L (ref 101–111)
Calcium, Ion: 1.27 mmol/L (ref 1.13–1.30)
Creatinine, Ser: 0.7 mg/dL (ref 0.61–1.24)
Glucose, Bld: 124 mg/dL — ABNORMAL HIGH (ref 65–99)
HEMATOCRIT: 31 % — AB (ref 39.0–52.0)
Hemoglobin: 10.5 g/dL — ABNORMAL LOW (ref 13.0–17.0)
POTASSIUM: 3.7 mmol/L (ref 3.5–5.1)
SODIUM: 141 mmol/L (ref 135–145)
TCO2: 22 mmol/L (ref 0–100)

## 2015-10-06 LAB — POCT I-STAT 3, ART BLOOD GAS (G3+)
ACID-BASE DEFICIT: 3 mmol/L — AB (ref 0.0–2.0)
ACID-BASE DEFICIT: 5 mmol/L — AB (ref 0.0–2.0)
Bicarbonate: 20.7 mEq/L (ref 20.0–24.0)
Bicarbonate: 22.9 mEq/L (ref 20.0–24.0)
O2 SAT: 91 %
O2 SAT: 97 %
PCO2 ART: 38.2 mmHg (ref 35.0–45.0)
PCO2 ART: 40.6 mmHg (ref 35.0–45.0)
Patient temperature: 36.5
Patient temperature: 36.6
TCO2: 22 mmol/L (ref 0–100)
TCO2: 24 mmol/L (ref 0–100)
pH, Arterial: 7.339 — ABNORMAL LOW (ref 7.350–7.450)
pH, Arterial: 7.358 (ref 7.350–7.450)
pO2, Arterial: 63 mmHg — ABNORMAL LOW (ref 80.0–100.0)
pO2, Arterial: 88 mmHg (ref 80.0–100.0)

## 2015-10-06 LAB — CBC
HCT: 32.1 % — ABNORMAL LOW (ref 39.0–52.0)
HEMATOCRIT: 31.8 % — AB (ref 39.0–52.0)
HEMOGLOBIN: 10.4 g/dL — AB (ref 13.0–17.0)
Hemoglobin: 10.7 g/dL — ABNORMAL LOW (ref 13.0–17.0)
MCH: 30.4 pg (ref 26.0–34.0)
MCH: 29.9 pg (ref 26.0–34.0)
MCHC: 33.3 g/dL (ref 30.0–36.0)
MCHC: 32.7 g/dL (ref 30.0–36.0)
MCV: 91.2 fL (ref 78.0–100.0)
MCV: 91.4 fL (ref 78.0–100.0)
PLATELETS: 99 10*3/uL — AB (ref 150–400)
Platelets: 94 10*3/uL — ABNORMAL LOW (ref 150–400)
RBC: 3.52 MIL/uL — ABNORMAL LOW (ref 4.22–5.81)
RBC: 3.48 MIL/uL — AB (ref 4.22–5.81)
RDW: 13.8 % (ref 11.5–15.5)
RDW: 13.8 % (ref 11.5–15.5)
WBC: 13.1 10*3/uL — AB (ref 4.0–10.5)
WBC: 12.9 10*3/uL — AB (ref 4.0–10.5)

## 2015-10-06 LAB — GLUCOSE, CAPILLARY
GLUCOSE-CAPILLARY: 101 mg/dL — AB (ref 65–99)
GLUCOSE-CAPILLARY: 103 mg/dL — AB (ref 65–99)
GLUCOSE-CAPILLARY: 110 mg/dL — AB (ref 65–99)
GLUCOSE-CAPILLARY: 110 mg/dL — AB (ref 65–99)
GLUCOSE-CAPILLARY: 111 mg/dL — AB (ref 65–99)
GLUCOSE-CAPILLARY: 115 mg/dL — AB (ref 65–99)
GLUCOSE-CAPILLARY: 149 mg/dL — AB (ref 65–99)
GLUCOSE-CAPILLARY: 79 mg/dL (ref 65–99)
GLUCOSE-CAPILLARY: 89 mg/dL (ref 65–99)
Glucose-Capillary: 147 mg/dL — ABNORMAL HIGH (ref 65–99)
Glucose-Capillary: 140 mg/dL — ABNORMAL HIGH (ref 65–99)
Glucose-Capillary: 109 mg/dL — ABNORMAL HIGH (ref 65–99)
Glucose-Capillary: 87 mg/dL (ref 65–99)
Glucose-Capillary: 88 mg/dL (ref 65–99)
Glucose-Capillary: 98 mg/dL (ref 65–99)
Glucose-Capillary: 153 mg/dL — ABNORMAL HIGH (ref 65–99)
Glucose-Capillary: 131 mg/dL — ABNORMAL HIGH (ref 65–99)
Glucose-Capillary: 133 mg/dL — ABNORMAL HIGH (ref 65–99)
Glucose-Capillary: 112 mg/dL — ABNORMAL HIGH (ref 65–99)
Glucose-Capillary: 252 mg/dL — ABNORMAL HIGH (ref 65–99)
Glucose-Capillary: 150 mg/dL — ABNORMAL HIGH (ref 65–99)

## 2015-10-06 LAB — CREATININE, SERUM
Creatinine, Ser: 0.81 mg/dL (ref 0.61–1.24)
GFR calc non Af Amer: >60 mL/min (ref >60)

## 2015-10-06 LAB — BASIC METABOLIC PANEL
ANION GAP: 6 (ref 5–15)
BUN: 8 mg/dL (ref 6–20)
CHLORIDE: 109 mmol/L (ref 101–111)
CO2: 22 mmol/L (ref 22–32)
Calcium: 8.6 mg/dL — ABNORMAL LOW (ref 8.9–10.3)
Creatinine, Ser: 0.88 mg/dL (ref 0.61–1.24)
GFR calc Af Amer: >60 mL/min (ref >60)
GLUCOSE: 125 mg/dL — AB (ref 65–99)
POTASSIUM: 4.1 mmol/L (ref 3.5–5.1)
Sodium: 137 mmol/L (ref 135–145)

## 2015-10-06 LAB — MAGNESIUM
Magnesium: 2.8 mg/dL — ABNORMAL HIGH (ref 1.7–2.4)
Magnesium: 2.4 mg/dL (ref 1.7–2.4)

## 2015-10-06 MED ORDER — WARFARIN - PHYSICIAN DOSING INPATIENT
Freq: Every day | Status: DC
  Administered 2015-10-06: 18:00:00

## 2015-10-06 MED ORDER — INSULIN DETEMIR 100 UNIT/ML ~~LOC~~ SOLN
20.0000 [IU] | Freq: Once | SUBCUTANEOUS | Status: AC
  Administered 2015-10-06: 20 [IU] via SUBCUTANEOUS
  Filled 2015-10-06: qty 0.2

## 2015-10-06 MED ORDER — TAMSULOSIN HCL 0.4 MG PO CAPS
0.4000 mg | ORAL_CAPSULE | Freq: Every day | ORAL | Status: DC
  Administered 2015-10-06 – 2015-10-13 (×8): 0.4 mg via ORAL
  Filled 2015-10-06 (×8): qty 1

## 2015-10-06 MED ORDER — MORPHINE SULFATE (PF) 2 MG/ML IV SOLN: 2.0000 mg | INTRAVENOUS | Status: DC | PRN

## 2015-10-06 MED ORDER — POTASSIUM CHLORIDE 10 MEQ/50ML IV SOLN
10.0000 meq | INTRAVENOUS | Status: AC
  Administered 2015-10-06 (×3): 10 meq via INTRAVENOUS
  Filled 2015-10-06 (×2): qty 50

## 2015-10-06 MED ORDER — MORPHINE SULFATE (PF) 2 MG/ML IV SOLN: 1.0000 mg | INTRAVENOUS | Status: DC | PRN

## 2015-10-06 MED ORDER — INSULIN ASPART 100 UNIT/ML ~~LOC~~ SOLN
0.0000 [IU] | SUBCUTANEOUS | Status: DC
  Administered 2015-10-06 – 2015-10-07 (×3): 2 [IU] via SUBCUTANEOUS

## 2015-10-06 MED ORDER — METOPROLOL TARTRATE 12.5 MG HALF TABLET
12.5000 mg | ORAL_TABLET | Freq: Two times a day (BID) | ORAL | Status: DC
Start: 2015-10-06 — End: 2015-10-07
  Administered 2015-10-07: 12.5 mg via ORAL
  Filled 2015-10-06: qty 1

## 2015-10-06 MED ORDER — CETYLPYRIDINIUM CHLORIDE 0.05 % MT LIQD
7.0000 mL | Freq: Two times a day (BID) | OROMUCOSAL | Status: DC
  Administered 2015-10-07 – 2015-10-13 (×11): 7 mL via OROMUCOSAL

## 2015-10-06 MED ORDER — WARFARIN SODIUM 2.5 MG PO TABS
2.5000 mg | ORAL_TABLET | Freq: Every day | ORAL | Status: DC
  Administered 2015-10-06 – 2015-10-07 (×2): 2.5 mg via ORAL
  Filled 2015-10-06 (×2): qty 1

## 2015-10-06 MED FILL — Heparin Sodium (Porcine) Inj 1000 Unit/ML: INTRAMUSCULAR | Qty: 10 | Status: AC

## 2015-10-06 MED FILL — Sodium Chloride IV Soln 0.9%: INTRAVENOUS | Qty: 3000 | Status: AC

## 2015-10-06 MED FILL — Lidocaine HCl IV Inj 20 MG/ML: INTRAVENOUS | Qty: 5 | Status: AC

## 2015-10-06 MED FILL — Sodium Bicarbonate IV Soln 8.4%: INTRAVENOUS | Qty: 50 | Status: AC

## 2015-10-06 MED FILL — Electrolyte-R (PH 7.4) Solution: INTRAVENOUS | Qty: 4000 | Status: AC

## 2015-10-06 MED FILL — Mannitol IV Soln 20%: INTRAVENOUS | Qty: 500 | Status: AC

## 2015-10-06 NOTE — Progress Notes (Signed)
[address]       [place] [address]             [phone]        CARDIOTHORACIC SURGERY PROGRESS NOTE  2 Days Post-Op S/P Procedure(s) (LRB): MINIMALLY INVASIVE MITRAL VALVE REPAIR (MVR) (Right) TRANSESOPHAGEAL ECHOCARDIOGRAM (TEE) (N/A)   Subjective: Looks good.  Awake and alert.  Denies pain, SOB.  Complains that mouth is dry.  Objective: Vital signs: BP Readings from Last 1 Encounters:  10/06/15 111/65   Pulse Readings from Last 1 Encounters:  10/06/15 66   Resp Readings from Last 1 Encounters:  10/06/15 23   Temp Readings from Last 1 Encounters:  10/06/15 97.5 F (36.4 C)     Hemodynamics: PAP: (34-56)/(6-23) 45/18 mmHg CO:  [3.1 L/min-5.1 L/min] 3.8 L/min CI:  [1.5 L/min/m2-2.4 L/min/m2] 1.8 L/min/m2  Physical Exam:  Rhythm:   AV pacing - 3rd degree AV block under pacer  Breath sounds: Diminished at bases  Heart sounds:  RRR w/out murmur  Incisions:  Dressings dry, intact  Abdomen:  Soft, non-distended, non-tender  Extremities:  Warm, well-perfused  Chest tubes:  Decreasing volume thin serosanguinous output, no air leak  Intake/Output from previous day: 03/02 0701 - 03/03 0700 In: 4985.2 [I.V.:3397.7; Blood:747.5; NG/GT:240; IV Piggyback:600] Out: 3620 [Urine:2715; Blood:5; Chest Tube:900] Intake/Output this shift:    Lab Results:  CBC: Recent Labs  10/05/15 1850 10/06/15 0314  WBC 13.2* 13.1*  HGB 10.9* 10.7*  HCT 32.7* 32.1*  PLT 120* 99*    BMET:  Recent Labs  10/05/15 0400  10/05/15 1712 10/05/15 1800 10/06/15 0314  NA 140  < > 143 140 137  K 4.2  < > 3.8 3.5 4.1  CL 111  < > 105  --  109  CO2 24  --   --   --  22  GLUCOSE 131*  < > 110* 109* 125*  BUN 12  < > 10  --  8  CREATININE 1.15  < > 0.80  --  0.88  CALCIUM 8.5*  --   --   --  8.6*  < > = values in this interval not displayed.   PT/INR:   Recent Labs  10/05/15 1850  LABPROT 19.5*  INR 1.65*    CBG (last 3)   Recent Labs  10/06/15 0258 10/06/15 0313 10/06/15 0414  GLUCAP 115* 109* 101*    ABG    Component Value Date/Time   PHART 7.339* 10/06/2015 0115   PCO2ART 38.2 10/06/2015 0115   PO2ART 63.0* 10/06/2015 0115   HCO3 20.7 10/06/2015 0115   TCO2 22 10/06/2015 0115   ACIDBASEDEF 5.0* 10/06/2015 0115   O2SAT 91.0 10/06/2015 0115    CXR: Bibasilar atelectasis and edema, R>L  Assessment/Plan:  Overall doing well now POD2 mini mitral repair Maintaining AV paced rhythm w/ stable hemodynamics and low PA pressures on low dose Neo drip, no signs of SAM or LVOTO at present Now in 3rd degree AV block on IV diltiazem @ 10 mg/hr and fairly metoprolol 25 mg every 6 hours Expected post op acute blood loss anemia, mild, stable Expected post op atelectasis, mild-moderate, R>L Chronic diastolic CHF with expected post-op volume excess, weight reportedly up 13 lbs above preop baseline Longstanding hypertension with hypertensive cardiomyopathy Type II diabetes mellitus, newly-diagnosed - excellent glycemic control Physical deconditioning   Stop diltiazem drip and decrease metoprolol - watch rhythm and hemodynamics closely  Wean neo drip as tolerated  D/C  Swan-Ganz and mobilize  Hold diuretics for now  Add levemir and wean insulin drip  Leave chest tubes until output decreased  Start coumadin slowly  Will need PT/OT consult once Aline out and stable off drips   Rexene Alberts, MD 10/06/2015 7:25 AM

## 2015-10-06 NOTE — Progress Notes (Addendum)
10/06/2015 1400 Noted pt. CBG 252, pt. Lunch tray noted to have items containing sugar. Order updated in EPIC and dietary notified of pt. Need for sugar free items. Insulin gtt continued for this hour due to elevated CBG. Upon recheck CBG 149. Oncoming RN updated on insulin gtt status.  [name], Arville Lime

## 2015-10-06 NOTE — Progress Notes (Signed)
SUBJECTIVE:  Awake and denies SOB and only has minimal pain.    PHYSICAL EXAM Filed Vitals:   10/06/15 0745 10/06/15 0800 10/06/15 0815 10/06/15 0830  BP:  124/71  126/71  Pulse: 65 65 66 66  Temp: 97.7 F (36.5 C)     TempSrc:      Resp: 20 20 20 22   Weight:      SpO2: 97% 98% 98% 97%   General:  No distress Lungs:  Clear Heart:  RRR, no rub Abdomen:  Positive bowel sounds, no rebound no guarding Extremities:  No edema   LABS:  Results for orders placed or performed during the hospital encounter of 10/04/15 (from the past 24 hour(s))  Glucose, capillary     Status: Abnormal   Collection Time: 10/05/15 10:03 AM  Result Value Ref Range   Glucose-Capillary 146 (H) 65 - 99 mg/dL   Comment 1 Arterial Specimen   Glucose, capillary     Status: Abnormal   Collection Time: 10/05/15 11:07 AM  Result Value Ref Range   Glucose-Capillary 135 (H) 65 - 99 mg/dL   Comment 1 Arterial Specimen   Glucose, capillary     Status: Abnormal   Collection Time: 10/05/15 12:11 PM  Result Value Ref Range   Glucose-Capillary 135 (H) 65 - 99 mg/dL  Glucose, capillary     Status: Abnormal   Collection Time: 10/05/15  1:03 PM  Result Value Ref Range   Glucose-Capillary 122 (H) 65 - 99 mg/dL   Comment 1 Arterial Specimen   Glucose, capillary     Status: None   Collection Time: 10/05/15  2:07 PM  Result Value Ref Range   Glucose-Capillary 99 65 - 99 mg/dL  I-STAT, chem 8     Status: Abnormal   Collection Time: 10/05/15  3:41 PM  Result Value Ref Range   Sodium 140 135 - 145 mmol/L   Potassium 3.8 3.5 - 5.1 mmol/L   Chloride 104 101 - 111 mmol/L   BUN 10 6 - 20 mg/dL   Creatinine, Ser 0.80 0.61 - 1.24 mg/dL   Glucose, Bld 97 65 - 99 mg/dL   Calcium, Ion 1.24 1.13 - 1.30 mmol/L   TCO2 24 0 - 100 mmol/L   Hemoglobin 10.2 (L) 13.0 - 17.0 g/dL   HCT 30.0 (L) 39.0 - 52.0 %  I-STAT, chem 8     Status: Abnormal   Collection Time: 10/05/15  5:12 PM  Result Value Ref Range   Sodium 143 135  - 145 mmol/L   Potassium 3.8 3.5 - 5.1 mmol/L   Chloride 105 101 - 111 mmol/L   BUN 10 6 - 20 mg/dL   Creatinine, Ser 0.80 0.61 - 1.24 mg/dL   Glucose, Bld 110 (H) 65 - 99 mg/dL   Calcium, Ion 1.25 1.13 - 1.30 mmol/L   TCO2 25 0 - 100 mmol/L   Hemoglobin 10.5 (L) 13.0 - 17.0 g/dL   HCT 31.0 (L) 39.0 - 52.0 %  I-STAT 4, (NA,K, GLUC, HGB,HCT)     Status: Abnormal   Collection Time: 10/05/15  6:00 PM  Result Value Ref Range   Sodium 140 135 - 145 mmol/L   Potassium 3.5 3.5 - 5.1 mmol/L   Glucose, Bld 109 (H) 65 - 99 mg/dL   HCT 31.0 (L) 39.0 - 52.0 %   Hemoglobin 10.5 (L) 13.0 - 17.0 g/dL  I-STAT 3, arterial blood gas (G3+)     Status: None   Collection Time: 10/05/15  6:12 PM  Result Value Ref Range   pH, Arterial 7.407 7.350 - 7.450   pCO2 arterial 36.4 35.0 - 45.0 mmHg   pO2, Arterial 90.0 80.0 - 100.0 mmHg   Bicarbonate 23.3 20.0 - 24.0 mEq/L   TCO2 24 0 - 100 mmol/L   O2 Saturation 98.0 %   Acid-base deficit 2.0 0.0 - 2.0 mmol/L   Patient temperature 35.5 C    Collection site ARTERIAL LINE    Drawn by Nurse    Sample type ARTERIAL   CBC     Status: Abnormal   Collection Time: 10/05/15  6:50 PM  Result Value Ref Range   WBC 13.2 (H) 4.0 - 10.5 K/uL   RBC 3.62 (L) 4.22 - 5.81 MIL/uL   Hemoglobin 10.9 (L) 13.0 - 17.0 g/dL   HCT 32.7 (L) 39.0 - 52.0 %   MCV 90.3 78.0 - 100.0 fL   MCH 30.1 26.0 - 34.0 pg   MCHC 33.3 30.0 - 36.0 g/dL   RDW 13.3 11.5 - 15.5 %   Platelets 120 (L) 150 - 400 K/uL  Protime-INR     Status: Abnormal   Collection Time: 10/05/15  6:50 PM  Result Value Ref Range   Prothrombin Time 19.5 (H) 11.6 - 15.2 seconds   INR 1.65 (H) 0.00 - 1.49  APTT     Status: None   Collection Time: 10/05/15  6:50 PM  Result Value Ref Range   aPTT 37 24 - 37 seconds  Glucose, capillary     Status: Abnormal   Collection Time: 10/05/15  7:16 PM  Result Value Ref Range   Glucose-Capillary 104 (H) 65 - 99 mg/dL   Comment 1 Arterial Specimen   Glucose, capillary      Status: Abnormal   Collection Time: 10/05/15  8:20 PM  Result Value Ref Range   Glucose-Capillary 105 (H) 65 - 99 mg/dL   Comment 1 Arterial Specimen   Glucose, capillary     Status: Abnormal   Collection Time: 10/05/15  9:23 PM  Result Value Ref Range   Glucose-Capillary 115 (H) 65 - 99 mg/dL   Comment 1 Arterial Specimen   Glucose, capillary     Status: Abnormal   Collection Time: 10/05/15 10:11 PM  Result Value Ref Range   Glucose-Capillary 103 (H) 65 - 99 mg/dL   Comment 1 Arterial Specimen   Glucose, capillary     Status: None   Collection Time: 10/05/15 11:06 PM  Result Value Ref Range   Glucose-Capillary 89 65 - 99 mg/dL   Comment 1 Arterial Specimen   Glucose, capillary     Status: Abnormal   Collection Time: 10/06/15 12:02 AM  Result Value Ref Range   Glucose-Capillary 111 (H) 65 - 99 mg/dL   Comment 1 Arterial Specimen   I-STAT 3, arterial blood gas (G3+)     Status: Abnormal   Collection Time: 10/06/15 12:04 AM  Result Value Ref Range   pH, Arterial 7.358 7.350 - 7.450   pCO2 arterial 40.6 35.0 - 45.0 mmHg   pO2, Arterial 88.0 80.0 - 100.0 mmHg   Bicarbonate 22.9 20.0 - 24.0 mEq/L   TCO2 24 0 - 100 mmol/L   O2 Saturation 97.0 %   Acid-base deficit 3.0 (H) 0.0 - 2.0 mmol/L   Patient temperature 36.6 C    Sample type ARTERIAL   Glucose, capillary     Status: Abnormal   Collection Time: 10/06/15  1:09 AM  Result Value Ref Range  Glucose-Capillary 147 (H) 65 - 99 mg/dL   Comment 1 Arterial Specimen   I-STAT 3, arterial blood gas (G3+)     Status: Abnormal   Collection Time: 10/06/15  1:15 AM  Result Value Ref Range   pH, Arterial 7.339 (L) 7.350 - 7.450   pCO2 arterial 38.2 35.0 - 45.0 mmHg   pO2, Arterial 63.0 (L) 80.0 - 100.0 mmHg   Bicarbonate 20.7 20.0 - 24.0 mEq/L   TCO2 22 0 - 100 mmol/L   O2 Saturation 91.0 %   Acid-base deficit 5.0 (H) 0.0 - 2.0 mmol/L   Patient temperature 36.5 C    Sample type ARTERIAL   Glucose, capillary     Status: Abnormal     Collection Time: 10/06/15  1:55 AM  Result Value Ref Range   Glucose-Capillary 140 (H) 65 - 99 mg/dL   Comment 1 Arterial Specimen   Glucose, capillary     Status: Abnormal   Collection Time: 10/06/15  2:58 AM  Result Value Ref Range   Glucose-Capillary 115 (H) 65 - 99 mg/dL   Comment 1 Arterial Specimen   Glucose, capillary     Status: Abnormal   Collection Time: 10/06/15  3:13 AM  Result Value Ref Range   Glucose-Capillary 109 (H) 65 - 99 mg/dL   Comment 1 Arterial Specimen   CBC     Status: Abnormal   Collection Time: 10/06/15  3:14 AM  Result Value Ref Range   WBC 13.1 (H) 4.0 - 10.5 K/uL   RBC 3.52 (L) 4.22 - 5.81 MIL/uL   Hemoglobin 10.7 (L) 13.0 - 17.0 g/dL   HCT 32.1 (L) 39.0 - 52.0 %   MCV 91.2 78.0 - 100.0 fL   MCH 30.4 26.0 - 34.0 pg   MCHC 33.3 30.0 - 36.0 g/dL   RDW 13.8 11.5 - 15.5 %   Platelets 99 (L) 150 - 400 K/uL  Basic metabolic panel     Status: Abnormal   Collection Time: 10/06/15  3:14 AM  Result Value Ref Range   Sodium 137 135 - 145 mmol/L   Potassium 4.1 3.5 - 5.1 mmol/L   Chloride 109 101 - 111 mmol/L   CO2 22 22 - 32 mmol/L   Glucose, Bld 125 (H) 65 - 99 mg/dL   BUN 8 6 - 20 mg/dL   Creatinine, Ser 0.88 0.61 - 1.24 mg/dL   Calcium 8.6 (L) 8.9 - 10.3 mg/dL   GFR calc non Af Amer >60 >60 mL/min   GFR calc Af Amer >60 >60 mL/min   Anion gap 6 5 - 15  Magnesium     Status: Abnormal   Collection Time: 10/06/15  3:14 AM  Result Value Ref Range   Magnesium 2.8 (H) 1.7 - 2.4 mg/dL  Glucose, capillary     Status: Abnormal   Collection Time: 10/06/15  4:14 AM  Result Value Ref Range   Glucose-Capillary 101 (H) 65 - 99 mg/dL   Comment 1 Arterial Specimen   Glucose, capillary     Status: None   Collection Time: 10/06/15  5:18 AM  Result Value Ref Range   Glucose-Capillary 87 65 - 99 mg/dL   Comment 1 Arterial Specimen   Glucose, capillary     Status: None   Collection Time: 10/06/15  6:06 AM  Result Value Ref Range   Glucose-Capillary 88 65 -  99 mg/dL   Comment 1 Arterial Specimen   Glucose, capillary     Status: None   Collection  Time: 10/06/15  7:01 AM  Result Value Ref Range   Glucose-Capillary 98 65 - 99 mg/dL   Comment 1 Arterial Specimen     Intake/Output Summary (Last 24 hours) at 10/06/15 0947 Last data filed at 10/06/15 0800  Gross per 24 hour  Intake 4702.08 ml  Output   3590 ml  Net 1112.08 ml     ASSESSMENT AND PLAN:  MR:  Status post repair.  Had dynamic LV outflow obstructive physiology with MR yesterday.  However, change of meds including addition of beta blocker/cardizem, and discontinuation of low dose inotrope improved his hemodynamics dramatically at the bedside.  Repeat TEE did not demonstrate the LV outflow gradient and MR suggested on TTE earlier in the day.   We will watch for this physiology as an out patient and manage as needed with oral meds.  For now cardizem off and beta blocker reduced with underlying CHB.  Rhythm paced.      Jeneen Rinks [facility] 10/06/2015 9:47 AM

## 2015-10-06 NOTE — Procedures (Signed)
Extubation Procedure NotE    Patient Details:   Name: [name] DOB: [birth year] MRN: [id number]   Airway Documentation:  Pre-extubation: Rapid Wean completed. ABG normal. NIF-28, VC 750. Cuff leak present. Follows all commands.  Post-extubation: 4L [place], No [place], Name/location spoken. Clear BBS.     Evaluation  O2 sats: stable throughout Complications: No apparent complications Patient did tolerate procedure well. Bilateral Breath Sounds: Clear, Diminished Suctioning: Oral, Airway Yes  Sharen Hint 10/06/2015, 12:31 AM

## 2015-10-06 NOTE — Care Management Note (Signed)
Case Management Note  Patient Details  Name: SERGE BAYERL MRN: [id number] Date of Birth: 19-Aug-1928  Subjective/Objective:         Pt is s/p Mini MVR - required to go back to OR day after intial           Action/Plan:  PTA - independent from home with wife, wife will be with pt 24/7 post discharge.  Pt intermittently uses cane for ambulation at home.  CM will continue to monitor for discharge needs   Expected Discharge Date:                  Expected Discharge Plan:  [place]  In-House Referral:     Discharge planning Services  CM Consult  Post Acute Care Choice:    Choice offered to:     DME Arranged:    DME Agency:     HH Arranged:    HH Agency:     Status of Service:  In process, will continue to follow  Medicare Important Message Given:    Date Medicare IM Given:    Medicare IM give by:    Date Additional Medicare IM Given:    Additional Medicare Important Message give by:     If discussed at [place] of Stay Meetings, dates discussed:    Additional Comments:  Maryclare Labrador, RN 10/06/2015, 1:38 PM

## 2015-10-06 NOTE — Progress Notes (Addendum)
1516 Patient received from RN 82 year-old male who underwent mitral valve repair on 10/04/2015 for severe symptomatic primary mitral regurgitation. Intraoperatively the patient had transient dynamic left ventricular outflow tract obstruction with systolic anterior motion of the mitral valve and recurrent mitral regurgitation after separation from cardioplegia bypass. Hemodynamics improved and left ventricular output tract obstruction appeared to resolve with adjustments to medical therapy and volume loading. The patient's initial postoperative recovery was uneventful. On the early morning of 10/05/2015 the patient developed hypotension with sudden increased in pulmonary artery pressures. By morning rounds patient was hypotensive on intravenous norepinephrine drip with elevated PA pressures and a prominent systolic murmur on physical exam. Milrinone infusion was discontinued and the patient was given intravenous metoprolol. Hemodynamics improved. The patient was started on diltiazem drip. Despite optimization and hemodynamics a follow-up transthoracic echocardiogram suggested the presence of persistent significant left ventricular outflow tract obstruction. The patient's wife was counseled regarding options and a plan was made to bring the patient back to the operating room for possible surgical intervention on 10/05/15.  Presents Up in chair watching television,on CM SR A-V paved, on O2 5L [place] humidified, maintains PT x 2 in place draining. Foley cath was removed by patient prior to receipt. Receiving IVF Insulin titratable and Phenyephrine. Vitals stable, no acute distress.

## 2015-10-06 NOTE — Progress Notes (Signed)
Patient ID: [name], male   DOB: [birth year], 82 y.o.   MRN: [id number]   SICU Evening Rounds:   Hemodynamically stable, neo is off. AV paced 66.   Foley removed at noon and has not voided yet. Plan bladder scan CT output low  CBC    Component Value Date/Time   WBC 12.9* 10/06/2015 1615   RBC 3.48* 10/06/2015 1615   HGB 10.4* 10/06/2015 1615   HGB 10.5* 10/06/2015 1615   HCT 31.8* 10/06/2015 1615   HCT 31.0* 10/06/2015 1615   PLT 94* 10/06/2015 1615   MCV 91.4 10/06/2015 1615   MCH 29.9 10/06/2015 1615   MCHC 32.7 10/06/2015 1615   RDW 13.8 10/06/2015 1615   LYMPHSABS 1.5 06/04/2015 1630   MONOABS 0.8 06/04/2015 1630   EOSABS 0.0 06/04/2015 1630   BASOSABS 0.0 06/04/2015 1630     BMET    Component Value Date/Time   NA 141 10/06/2015 1615   K 3.7 10/06/2015 1615   CL 106 10/06/2015 1615   CO2 22 10/06/2015 0314   GLUCOSE 124* 10/06/2015 1615   GLUCOSE 97 03/16/2015 1552   BUN 11 10/06/2015 1615   CREATININE 0.81 10/06/2015 1615   CREATININE 0.70 10/06/2015 1615   CREATININE 1.03 07/13/2015 1314   CALCIUM 8.6* 10/06/2015 0314   GFRNONAA >60 10/06/2015 1615   GFRNONAA 59* 05/08/2015 1432   GFRAA >60 10/06/2015 1615   GFRAA 68 05/08/2015 1432     A/P:  Stable postop course. Continue current plans

## 2015-10-06 NOTE — Progress Notes (Signed)
10/06/2015 1230 During pt. Rounds, noted bloody urine in foley bag. PT. Resting in chair, pt. Is confused. Prior urine drainage clear and yellow. Upon questioning pt. States he did not alter foley, pt. Denies pain. Upon inspection urethra with bloody drainage, foley leaking. Foley removed. Education provided to patient in regards to post foley removal. Dr. Roxy Manns on floor and made aware. Will continue to closely monitor patient.  1400 Pt. Transferred to room 2s02. RN assuming care updated on status of foley removal.  Jowana Thumma, Arville Lime

## 2015-10-07 ENCOUNTER — Inpatient Hospital Stay (HOSPITAL_COMMUNITY): Payer: Medicare Other

## 2015-10-07 DIAGNOSIS — Z9889 Other specified postprocedural states: Secondary | ICD-10-CM

## 2015-10-07 LAB — CBC
HCT: 29.9 % — ABNORMAL LOW (ref 39.0–52.0)
Hemoglobin: 9.6 g/dL — ABNORMAL LOW (ref 13.0–17.0)
MCH: 29.6 pg (ref 26.0–34.0)
MCHC: 32.1 g/dL (ref 30.0–36.0)
MCV: 92.3 fL (ref 78.0–100.0)
PLATELETS: 95 10*3/uL — AB (ref 150–400)
RBC: 3.24 MIL/uL — ABNORMAL LOW (ref 4.22–5.81)
RDW: 13.8 % (ref 11.5–15.5)
WBC: 10.6 10*3/uL — AB (ref 4.0–10.5)

## 2015-10-07 LAB — GLUCOSE, CAPILLARY
GLUCOSE-CAPILLARY: 119 mg/dL — AB (ref 65–99)
GLUCOSE-CAPILLARY: 124 mg/dL — AB (ref 65–99)
GLUCOSE-CAPILLARY: 117 mg/dL — AB (ref 65–99)
GLUCOSE-CAPILLARY: 164 mg/dL — AB (ref 65–99)
Glucose-Capillary: 88 mg/dL (ref 65–99)
Glucose-Capillary: 119 mg/dL — ABNORMAL HIGH (ref 65–99)
Glucose-Capillary: 147 mg/dL — ABNORMAL HIGH (ref 65–99)

## 2015-10-07 LAB — PROTIME-INR
INR: 1.56 — AB (ref 0.00–1.49)
Prothrombin Time: 18.7 seconds — ABNORMAL HIGH (ref 11.6–15.2)

## 2015-10-07 LAB — BASIC METABOLIC PANEL
Anion gap: 5 (ref 5–15)
BUN: 15 mg/dL (ref 6–20)
CO2: 24 mmol/L (ref 22–32)
CREATININE: 0.93 mg/dL (ref 0.61–1.24)
Calcium: 8.7 mg/dL — ABNORMAL LOW (ref 8.9–10.3)
Chloride: 110 mmol/L (ref 101–111)
GFR calc Af Amer: >60 mL/min (ref >60)
Glucose, Bld: 101 mg/dL — ABNORMAL HIGH (ref 65–99)
Potassium: 4.5 mmol/L (ref 3.5–5.1)
SODIUM: 139 mmol/L (ref 135–145)

## 2015-10-07 LAB — TISSUE CULTURE: Culture: NO GROWTH

## 2015-10-07 MED ORDER — ASPIRIN EC 81 MG PO TBEC
81.0000 mg | DELAYED_RELEASE_TABLET | Freq: Every day | ORAL | Status: DC
  Administered 2015-10-08: 81 mg via ORAL
  Filled 2015-10-07: qty 1

## 2015-10-07 MED ORDER — ENOXAPARIN SODIUM 30 MG/0.3ML ~~LOC~~ SOLN
30.0000 mg | SUBCUTANEOUS | Status: DC
Start: 2015-10-07 — End: 2015-10-07
  Administered 2015-10-07: 30 mg via SUBCUTANEOUS
  Filled 2015-10-07: qty 0.3

## 2015-10-07 MED ORDER — INSULIN ASPART 100 UNIT/ML ~~LOC~~ SOLN
0.0000 [IU] | Freq: Three times a day (TID) | SUBCUTANEOUS | Status: DC
  Administered 2015-10-08: 3 [IU] via SUBCUTANEOUS
  Administered 2015-10-08 (×2): 2 [IU] via SUBCUTANEOUS

## 2015-10-07 MED ORDER — FUROSEMIDE 10 MG/ML IJ SOLN
40.0000 mg | Freq: Once | INTRAMUSCULAR | Status: AC
  Administered 2015-10-07: 40 mg via INTRAVENOUS
  Filled 2015-10-07: qty 4

## 2015-10-07 NOTE — Progress Notes (Signed)
2 Days Post-Op Procedure(s) (LRB): Sternal Incision (N/A) INTRAOPERATIVE TRANSESOPHAGEAL ECHOCARDIOGRAM Subjective:  No complaints  Objective: Vital signs in last 24 hours: Temp:  [97.6 F (36.4 C)-98.5 F (36.9 C)] 98 F (36.7 C) (03/04 1140) Pulse Rate:  [64-68] 65 (03/04 1100) Cardiac Rhythm:  [-] A-V Sequential paced (03/04 1100)   Asystole under pacer Resp:  [11-24] 18 (03/04 1100) BP: (86-137)/(45-80) 117/67 mmHg (03/04 1100) SpO2:  [91 %-100 %] 91 % (03/04 1100) Arterial Line BP: (117-157)/(47-67) 144/62 mmHg (03/03 1800) FiO2 (%):  [55 %] 55 % (03/03 1400) Weight:  [105.4 kg (232 lb 5.8 oz)] 105.4 kg (232 lb 5.8 oz) (03/04 0800)  Hemodynamic parameters for last 24 hours:    Intake/Output from previous day: 03/03 0701 - 03/04 0700 In: 1238.5 [P.O.:630; I.V.:358.5; IV Piggyback:250] Out: 1560 [Urine:480; Chest Tube:1080] Intake/Output this shift: Total I/O In: 50 [IV Piggyback:50] Out: 375 [Urine:225; Chest Tube:150]  General appearance: alert and cooperative Neurologic: intact Heart: regular rate and rhythm, S1, S2 normal, no murmur, click, rub or gallop Lungs: clear to auscultation bilaterally Extremities: edema mild Wound: dressings dry Chest tube output serosanguinous, no air leak Lab Results:  Recent Labs  10/06/15 1615 10/07/15 0352  WBC 12.9* 10.6*  HGB 10.4*  10.5* 9.6*  HCT 31.8*  31.0* 29.9*  PLT 94* 95*   BMET:  Recent Labs  10/06/15 0314 10/06/15 1615 10/07/15 0352  NA 137 141 139  K 4.1 3.7 4.5  CL 109 106 110  CO2 22  --  24  GLUCOSE 125* 124* 101*  BUN 8 11 15   CREATININE 0.88 0.81  0.70 0.93  CALCIUM 8.6*  --  8.7*    PT/INR:  Recent Labs  10/07/15 0352  LABPROT 18.7*  INR 1.56*   ABG    Component Value Date/Time   PHART 7.339* 10/06/2015 0115   HCO3 20.7 10/06/2015 0115   TCO2 22 10/06/2015 1615   ACIDBASEDEF 5.0* 10/06/2015 0115   O2SAT 91.0 10/06/2015 0115   CBG (last 3)   Recent Labs  10/07/15 0335  10/07/15 0730 10/07/15 1138  GLUCAP 88 124* 119*   CLINICAL DATA: Congestive heart failure.  EXAM: PORTABLE CHEST 1 VIEW  COMPARISON: October 06, 2015  FINDINGS: The PA catheter has been removed with a left IJ sheath remaining. Other support apparatus is stable. No pneumothorax identified. Bilateral layering effusions with underlying atelectasis are stable. More diffuse opacity remains in the right mid and lower lung. Stable cardiomegaly. No other interval changes.  IMPRESSION: Interval removal of the PA catheter. Small bilateral layering effusions with underlying atelectasis. Persistent opacity in the right mid and lower lung.   Electronically Signed  By: Dorise Bullion III M.D  On: 10/07/2015 07:02  Assessment/Plan:  S/P mitral valve repair with postop systolic anterior motion and LVOT obstruction  Hemodynamically stable.   There is no underlying rhythm so will have to stop lopressor. Continue AV pacing at 66 for now.  Volume excess. Slow diuresis to maintain LV filling.  Advanced age and deconditioning: Mobilize as tolerated. Will need PT.  Keep chest tubes in today  Coumadin and ASA 81 mg for valve repair.    LOS: 3 days    [name] 10/07/2015

## 2015-10-07 NOTE — Progress Notes (Signed)
Patient ID: [name], male   DOB: 08/23/1928, 82 y.o.   MRN: [id number]  SICU Evening Rounds:  Hemodynamically stable. AV paced 66.  Diuresing  Very deconditioned. Walked short distance in room with 2 people.  Pm labs pending

## 2015-10-08 ENCOUNTER — Inpatient Hospital Stay (HOSPITAL_COMMUNITY): Payer: Medicare Other

## 2015-10-08 LAB — CBC
HEMATOCRIT: 28.4 % — AB (ref 39.0–52.0)
HEMOGLOBIN: 9.1 g/dL — AB (ref 13.0–17.0)
MCH: 29.7 pg (ref 26.0–34.0)
MCHC: 32 g/dL (ref 30.0–36.0)
MCV: 92.8 fL (ref 78.0–100.0)
Platelets: 88 10*3/uL — ABNORMAL LOW (ref 150–400)
RBC: 3.06 MIL/uL — AB (ref 4.22–5.81)
RDW: 13.7 % (ref 11.5–15.5)
WBC: 8.5 10*3/uL (ref 4.0–10.5)

## 2015-10-08 LAB — GLUCOSE, CAPILLARY
GLUCOSE-CAPILLARY: 154 mg/dL — AB (ref 65–99)
GLUCOSE-CAPILLARY: 173 mg/dL — AB (ref 65–99)
Glucose-Capillary: 126 mg/dL — ABNORMAL HIGH (ref 65–99)
Glucose-Capillary: 111 mg/dL — ABNORMAL HIGH (ref 65–99)

## 2015-10-08 LAB — BASIC METABOLIC PANEL
Anion gap: 6 (ref 5–15)
BUN: 20 mg/dL (ref 6–20)
CHLORIDE: 107 mmol/L (ref 101–111)
CO2: 25 mmol/L (ref 22–32)
Calcium: 8.7 mg/dL — ABNORMAL LOW (ref 8.9–10.3)
Creatinine, Ser: 0.98 mg/dL (ref 0.61–1.24)
GFR calc Af Amer: >60 mL/min (ref >60)
GFR calc non Af Amer: >60 mL/min (ref >60)
Glucose, Bld: 145 mg/dL — ABNORMAL HIGH (ref 65–99)
POTASSIUM: 4.4 mmol/L (ref 3.5–5.1)
SODIUM: 138 mmol/L (ref 135–145)

## 2015-10-08 LAB — PROTIME-INR
INR: 1.31 (ref 0.00–1.49)
Prothrombin Time: 16.4 seconds — ABNORMAL HIGH (ref 11.6–15.2)

## 2015-10-08 MED ORDER — WARFARIN SODIUM 3 MG PO TABS
4.0000 mg | ORAL_TABLET | Freq: Every day | ORAL | Status: DC
  Administered 2015-10-08: 4 mg via ORAL
  Filled 2015-10-08: qty 0.5

## 2015-10-08 MED ORDER — SODIUM CHLORIDE 0.9% FLUSH: 10.0000 mL | INTRAVENOUS | Status: DC | PRN

## 2015-10-08 MED ORDER — SODIUM CHLORIDE 0.9% FLUSH
10.0000 mL | Freq: Two times a day (BID) | INTRAVENOUS | Status: DC
  Administered 2015-10-09: 10 mL via INTRAVENOUS

## 2015-10-08 MED ORDER — FUROSEMIDE 10 MG/ML IJ SOLN
40.0000 mg | Freq: Once | INTRAMUSCULAR | Status: AC
  Administered 2015-10-08: 40 mg via INTRAVENOUS
  Filled 2015-10-08: qty 4

## 2015-10-08 NOTE — Progress Notes (Signed)
Pt reports a c/o "full feeling" in bladder and inability to void. Bladder scan revealed 153 & 190 cc in the bladder. Attempted to assist Pt to use urinal without success. Education provided of possible bladder spasm, Pt states understanding. Will continue to monitor and assess.

## 2015-10-08 NOTE — Progress Notes (Signed)
3 Days Post-Op Procedure(s) (LRB): Sternal Incision (N/A) INTRAOPERATIVE TRANSESOPHAGEAL ECHOCARDIOGRAM Subjective:  No complaints. No pain  Ambulated 60 ft this am.  Objective: Vital signs in last 24 hours: Temp:  [97.9 F (36.6 C)-98.9 F (37.2 C)] 97.9 F (36.6 C) (03/05 0729) Pulse Rate:  [65-66] 66 (03/05 1000) Cardiac Rhythm:  [-] A-V Sequential paced (03/05 1000)    Rhythm under pacer looks junctional in the 30's. Resp:  [12-27] 14 (03/05 1000) BP: (89-146)/(37-84) 123/59 mmHg (03/05 1000) SpO2:  [89 %-99 %] 89 % (03/05 1000) Weight:  [107.2 kg (236 lb 5.3 oz)] 107.2 kg (236 lb 5.3 oz) (03/05 0400)  Hemodynamic parameters for last 24 hours:    Intake/Output from previous day: 03/04 0701 - 03/05 0700 In: 530 [P.O.:480; IV Piggyback:50] Out: 1305 [Urine:795; Chest Tube:510] Intake/Output this shift: Total I/O In: 100 [P.O.:100] Out: 100 [Urine:100]  General appearance: alert and cooperative Neurologic: intact Heart: regular rate and rhythm, S1, S2 normal, no murmur, click, rub or gallop Lungs: diminished breath sounds bibasilar Extremities: edema mild Wound: incisions ok  Lab Results:  Recent Labs  10/07/15 0352 10/08/15 0356  WBC 10.6* 8.5  HGB 9.6* 9.1*  HCT 29.9* 28.4*  PLT 95* 88*   BMET:  Recent Labs  10/07/15 0352 10/08/15 0356  NA 139 138  K 4.5 4.4  CL 110 107  CO2 24 25  GLUCOSE 101* 145*  BUN 15 20  CREATININE 0.93 0.98  CALCIUM 8.7* 8.7*    PT/INR:  Recent Labs  10/08/15 0356  LABPROT 16.4*  INR 1.31   ABG    Component Value Date/Time   PHART 7.339* 10/06/2015 0115   HCO3 20.7 10/06/2015 0115   TCO2 22 10/06/2015 1615   ACIDBASEDEF 5.0* 10/06/2015 0115   O2SAT 91.0 10/06/2015 0115   CBG (last 3)   Recent Labs  10/07/15 1745 10/07/15 2058 10/08/15 0727  GLUCAP 117* 164* 154*   CLINICAL DATA: Post MVR 10/04/2015, history mitral valve prolapse, coronary artery disease, diabetes mellitus, hypertension,  former smoker, pulmonary hypertension, chronic diastolic heart failure  EXAM: PORTABLE CHEST 1 VIEW  COMPARISON: Portable exam 0512 hours compared to 10/07/2015  FINDINGS: RIGHT thoracostomy tubes unchanged.  LEFT jugular central venous catheter unchanged.  Epicardial pacing leads noted.  Enlargement of cardiac silhouette with pulmonary vascular congestion.  Persistent atelectasis and minimal effusion at mid inferior RIGHT chest.  Mild LEFT basilar atelectasis and small LEFT pleural effusion.  No definite pneumothorax.  IMPRESSION: RIGHT thoracostomy tubes with persistent basilar atelectasis and small effusion without pneumothorax per enlargement of cardiac silhouette with pulmonary vascular congestion.  Persistent mile LEFT basilar atelectasis and small pleural effusion.   Electronically Signed  By: Lavonia Dana M.D.  On: 10/08/2015 07:14   Assessment/Plan: S/P Procedure(s) (LRB): Sternal Incision (N/A) INTRAOPERATIVE TRANSESOPHAGEAL ECHOCARDIOGRAM  He is hemodynamically stable.  Rhythm is slow junctional vs A-fib in the 30's on no blocking agents. Continue DDD 66.  Chest tube output decreasing but still too much to remove today.  Diuresing well and renal function normal. Weight is 16 lbs over baseline.  Continue coumadin.  Continue ambulation and IS.       LOS: 4 days    Gaye Pollack 10/08/2015

## 2015-10-09 ENCOUNTER — Inpatient Hospital Stay (HOSPITAL_COMMUNITY): Payer: Medicare Other

## 2015-10-09 DIAGNOSIS — I5032 Chronic diastolic (congestive) heart failure: Secondary | ICD-10-CM

## 2015-10-09 LAB — TYPE AND SCREEN
ABO/RH(D): A POS
Antibody Screen: NEGATIVE
UNIT DIVISION: 0
UNIT DIVISION: 0
UNIT DIVISION: 0
Unit division: 0
Unit division: 0
Unit division: 0

## 2015-10-09 LAB — BASIC METABOLIC PANEL
ANION GAP: 6 (ref 5–15)
BUN: 18 mg/dL (ref 6–20)
CALCIUM: 8.7 mg/dL — AB (ref 8.9–10.3)
CO2: 25 mmol/L (ref 22–32)
Chloride: 107 mmol/L (ref 101–111)
Creatinine, Ser: 0.9 mg/dL (ref 0.61–1.24)
GFR calc Af Amer: >60 mL/min (ref >60)
GFR calc non Af Amer: >60 mL/min (ref >60)
GLUCOSE: 127 mg/dL — AB (ref 65–99)
Potassium: 4.2 mmol/L (ref 3.5–5.1)
Sodium: 138 mmol/L (ref 135–145)

## 2015-10-09 LAB — ANAEROBIC CULTURE

## 2015-10-09 LAB — CBC
HEMATOCRIT: 30.1 % — AB (ref 39.0–52.0)
Hemoglobin: 9.8 g/dL — ABNORMAL LOW (ref 13.0–17.0)
MCH: 30.3 pg (ref 26.0–34.0)
MCHC: 32.6 g/dL (ref 30.0–36.0)
MCV: 93.2 fL (ref 78.0–100.0)
Platelets: 107 10*3/uL — ABNORMAL LOW (ref 150–400)
RBC: 3.23 MIL/uL — ABNORMAL LOW (ref 4.22–5.81)
RDW: 13.5 % (ref 11.5–15.5)
WBC: 8.1 10*3/uL (ref 4.0–10.5)

## 2015-10-09 LAB — GLUCOSE, CAPILLARY: Glucose-Capillary: 116 mg/dL — ABNORMAL HIGH (ref 65–99)

## 2015-10-09 LAB — PROTIME-INR
INR: 1.36 (ref 0.00–1.49)
Prothrombin Time: 16.9 seconds — ABNORMAL HIGH (ref 11.6–15.2)

## 2015-10-09 MED ORDER — POTASSIUM CHLORIDE CRYS ER 20 MEQ PO TBCR
20.0000 meq | EXTENDED_RELEASE_TABLET | Freq: Every day | ORAL | Status: DC
  Administered 2015-10-09: 20 meq via ORAL
  Filled 2015-10-09: qty 1

## 2015-10-09 MED ORDER — FINASTERIDE 5 MG PO TABS
5.0000 mg | ORAL_TABLET | Freq: Every day | ORAL | Status: DC
  Administered 2015-10-09 – 2015-10-13 (×5): 5 mg via ORAL
  Filled 2015-10-09 (×5): qty 1

## 2015-10-09 MED ORDER — FUROSEMIDE 40 MG PO TABS
40.0000 mg | ORAL_TABLET | Freq: Every day | ORAL | Status: DC
  Administered 2015-10-10 – 2015-10-13 (×4): 40 mg via ORAL
  Filled 2015-10-09 (×4): qty 1

## 2015-10-09 MED ORDER — GLUCERNA SHAKE PO LIQD
237.0000 mL | Freq: Three times a day (TID) | ORAL | Status: DC
Start: 2015-10-09 — End: 2015-10-13
  Administered 2015-10-09 – 2015-10-13 (×11): 237 mL via ORAL

## 2015-10-09 MED ORDER — AMIODARONE HCL 200 MG PO TABS
200.0000 mg | ORAL_TABLET | Freq: Two times a day (BID) | ORAL | Status: DC
  Administered 2015-10-09 – 2015-10-13 (×9): 200 mg via ORAL
  Filled 2015-10-09 (×9): qty 1

## 2015-10-09 MED ORDER — SODIUM CHLORIDE 0.9% FLUSH: 3.0000 mL | INTRAVENOUS | Status: DC | PRN

## 2015-10-09 MED ORDER — WARFARIN SODIUM 5 MG PO TABS
5.0000 mg | ORAL_TABLET | Freq: Every day | ORAL | Status: DC
  Administered 2015-10-09 – 2015-10-11 (×3): 5 mg via ORAL
  Filled 2015-10-09 (×3): qty 1

## 2015-10-09 MED ORDER — MOVING RIGHT ALONG BOOK
Freq: Once | Status: AC
Start: 2015-10-09 — End: 2015-10-09
  Administered 2015-10-09: 09:00:00
  Filled 2015-10-09: qty 1

## 2015-10-09 MED ORDER — FUROSEMIDE 10 MG/ML IJ SOLN
40.0000 mg | Freq: Once | INTRAMUSCULAR | Status: AC
  Administered 2015-10-09: 40 mg via INTRAVENOUS
  Filled 2015-10-09: qty 4

## 2015-10-09 MED ORDER — SODIUM CHLORIDE 0.9% FLUSH
3.0000 mL | Freq: Two times a day (BID) | INTRAVENOUS | Status: DC
  Administered 2015-10-09 – 2015-10-13 (×7): 3 mL via INTRAVENOUS

## 2015-10-09 MED ORDER — PRAVASTATIN SODIUM 40 MG PO TABS
40.0000 mg | ORAL_TABLET | Freq: Every day | ORAL | Status: DC
  Administered 2015-10-09 – 2015-10-12 (×4): 40 mg via ORAL
  Filled 2015-10-09 (×4): qty 1

## 2015-10-09 MED ORDER — ENOXAPARIN SODIUM 30 MG/0.3ML ~~LOC~~ SOLN
30.0000 mg | SUBCUTANEOUS | Status: DC
  Administered 2015-10-09 – 2015-10-11 (×3): 30 mg via SUBCUTANEOUS
  Filled 2015-10-09 (×3): qty 0.3

## 2015-10-09 MED ORDER — ASPIRIN EC 81 MG PO TBEC
81.0000 mg | DELAYED_RELEASE_TABLET | Freq: Every day | ORAL | Status: DC
  Administered 2015-10-09 – 2015-10-13 (×4): 81 mg via ORAL
  Filled 2015-10-09 (×5): qty 1

## 2015-10-09 MED ORDER — SODIUM CHLORIDE 0.9 % IV SOLN: 250.0000 mL | INTRAVENOUS | Status: DC | PRN

## 2015-10-09 MED FILL — Magnesium Sulfate Inj 50%: INTRAMUSCULAR | Qty: 10 | Status: AC

## 2015-10-09 MED FILL — Heparin Sodium (Porcine) Inj 1000 Unit/ML: INTRAMUSCULAR | Qty: 30 | Status: AC

## 2015-10-09 MED FILL — Electrolyte-R (PH 7.4) Solution: INTRAVENOUS | Qty: 3000 | Status: AC

## 2015-10-09 MED FILL — Cefuroxime Sodium For Inj 750 MG: INTRAMUSCULAR | Qty: 750 | Status: AC

## 2015-10-09 MED FILL — Sodium Chloride IV Soln 0.9%: INTRAVENOUS | Qty: 2000 | Status: AC

## 2015-10-09 MED FILL — Potassium Chloride Inj 2 mEq/ML: INTRAVENOUS | Qty: 40 | Status: AC

## 2015-10-09 NOTE — Care Management Important Message (Signed)
Important Message  Patient Details  Name: [name] MRN: [id number] Date of Birth: 1929/03/29   Medicare Important Message Given:  Yes    Nathen May 10/09/2015, 2:51 PM

## 2015-10-09 NOTE — Progress Notes (Signed)
Patient with small amount of red blood with yellow urine in toilet when voiding, Patient states this is not new. will monitor patient. Staphanie Harbison, Bettina Gavia RN

## 2015-10-09 NOTE — Consult Note (Signed)
   [facility] CM Inpatient Consult   10/09/2015  [name] Sep 26, 1928 [id number]   Patient screened for [place] Management services. Went to bedside to speak with patient and daughters at bedside. Patient's daughters state that [name]'s wife will be in shortly and that writer should speak with her. Will come back later to speak with [name].  Marthenia Rolling, MSN-Ed, RN,BSN [facility] Liaison [phone]

## 2015-10-09 NOTE — Progress Notes (Addendum)
[address]       [place] [address]             [phone]        CARDIOTHORACIC SURGERY PROGRESS NOTE  R5 Days Post-Op S/P Procedure(s) (LRB): MINIMALLY INVASIVE MITRAL VALVE REPAIR (MVR) (Right) TRANSESOPHAGEAL ECHOCARDIOGRAM (TEE) (N/A)    R4 Days Post-Op Procedure(s) (LRB): Sternal Incision (N/A) INTRAOPERATIVE TRANSESOPHAGEAL ECHOCARDIOGRAM  Subjective: Feels well.  No pain, SOB.  No complaints.  Objective: Vital signs: BP Readings from Last 1 Encounters:  10/09/15 121/72   Pulse Readings from Last 1 Encounters:  10/09/15 65   Resp Readings from Last 1 Encounters:  10/09/15 17   Temp Readings from Last 1 Encounters:  10/09/15 97.8 F (36.6 C) Oral    Hemodynamics:    Physical Exam:  Rhythm:   Afib/flutter w/ HR 50-60  Breath sounds: Diminished at bases  Heart sounds:  Irregular - no murmur  Incisions:  Clean and dry  Abdomen:  Soft, non-distended, non-tender  Extremities:  Warm, well-perfused  Chest tubes:  Decreasing volume thin serosanguinous output, no air leak    Intake/Output from previous day: 03/05 0701 - 03/06 0700 In: 1210 [P.O.:1210] Out: 1245 [Urine:825; Chest Tube:420] Intake/Output this shift:    Lab Results:  CBC: Recent Labs  10/08/15 0356 10/09/15 0310  WBC 8.5 8.1  HGB 9.1* 9.8*  HCT 28.4* 30.1*  PLT 88* 107*    BMET:  Recent Labs  10/08/15 0356 10/09/15 0310  NA 138 138  K 4.4 4.2  CL 107 107  CO2 25 25  GLUCOSE 145* 127*  BUN 20 18  CREATININE 0.98 0.90  CALCIUM 8.7* 8.7*     PT/INR:   Recent Labs  10/09/15 0310  LABPROT 16.9*  INR 1.36    CBG (last 3)   Recent Labs  10/08/15 1229 10/08/15 1620 10/08/15 2124  GLUCAP 173* 126* 111*    ABG    Component Value Date/Time   PHART 7.339* 10/06/2015 0115   PCO2ART 38.2 10/06/2015 0115   PO2ART 63.0* 10/06/2015 0115   HCO3 20.7 10/06/2015 0115   TCO2 22 10/06/2015 1615   ACIDBASEDEF 5.0* 10/06/2015 0115   O2SAT  91.0 10/06/2015 0115    CXR: PORTABLE CHEST 1 VIEW  COMPARISON: 10/08/2015  FINDINGS: The cardiac shadow is enlarged but stable. Two right-sided chest tubes are again seen and stable no pneumothorax is noted. Right basilar atelectasis is noted slightly progressed when compare with the prior exam. No bony abnormality is noted.  IMPRESSION: Increased right basilar atelectatic changes.  Thoracostomy catheters without pneumothorax.  Although not mentioned in the body of the report, a left jugular sheath remains.   Electronically Signed  By: Inez Catalina M.D.  On: 10/09/2015 07:16  Assessment/Plan:  Patient remains clinically stable Atrial lead ECG documents patient is clearly in Afib or Aflutter, not in AV block BP stable No further episodes suggestive of recurrent SAM or LVOTO Chronic diastolic CHF with expected post-op volume excess, weight up yesterday 14 lbs above preop baseline Chest tubes still draining but output tapering off Expected post op acute blood loss anemia, Hgb up to 9.8 Expected post op atelectasis, mild Post op thrombocytopenia, improved, platelet count up to 107k Physical deconditioning   Restart amiodarone  VVI backup pacing for now  Mobilize  Gentle diuresis  Continue coumadin at increased dose  Restart low dose lovenox and watch platelet count  PT consult  Transfer step down  Rexene Alberts,  MD 10/09/2015 7:42 AM

## 2015-10-09 NOTE — Progress Notes (Signed)
Patient arrived from [place], [place] stated he ambulated part of the way to room, placed on monitor and called to [place] signs obtained will monitor patient. Daphney Hopke, Bettina Gavia RN

## 2015-10-09 NOTE — Discharge Instructions (Addendum)
Information on my medicine - Coumadin   (Warfarin)  This medication education was reviewed with me or my healthcare representative as part of my discharge preparation.  The pharmacist that spoke with me during my hospital stay was:  Romona Curls, [facility]  Why was Coumadin prescribed for you? Coumadin was prescribed for you because you have a blood clot or a medical condition that can cause an increased risk of forming blood clots. Blood clots can cause serious health problems by blocking the flow of blood to the heart, lung, or brain. Coumadin can prevent harmful blood clots from forming. As a reminder your indication for Coumadin is:   Blood Clot Prevention After Heart Valve Surgery  What test will check on my response to Coumadin? While on Coumadin (warfarin) you will need to have an INR test regularly to ensure that your dose is keeping you in the desired range. The INR (international normalized ratio) number is calculated from the result of the laboratory test called prothrombin time (PT).  If an INR APPOINTMENT HAS NOT ALREADY BEEN MADE FOR YOU please schedule an appointment to have this lab work done by your health care provider within 7 days. Your INR goal is usually a number between:  2 to 3 or your provider may give you a more narrow range like 2-2.5.  Ask your health care provider during an office visit what your goal INR is.  What  do you need to  know  About  COUMADIN? Take Coumadin (warfarin) exactly as prescribed by your healthcare provider about the same time each day.  DO NOT stop taking without talking to the doctor who prescribed the medication.  Stopping without other blood clot prevention medication to take the place of Coumadin may increase your risk of developing a new clot or stroke.  Get refills before you run out.  What do you do if you miss a dose? If you miss a dose, take it as soon as you remember on the same day then continue your regularly scheduled regimen the next  day.  Do not take two doses of Coumadin at the same time.  Important Safety Information A possible side effect of Coumadin (Warfarin) is an increased risk of bleeding. You should call your healthcare provider right away if you experience any of the following: ? Bleeding from an injury or your nose that does not stop. ? Unusual colored urine (red or dark brown) or unusual colored stools (red or black). ? Unusual bruising for unknown reasons. ? A serious fall or if you hit your head (even if there is no bleeding).  Some foods or medicines interact with Coumadin (warfarin) and might alter your response to warfarin. To help avoid this: ? Eat a balanced diet, maintaining a consistent amount of Vitamin K. ? Notify your provider about major diet changes you plan to make. ? Avoid alcohol or limit your intake to 1 drink for women and 2 drinks for men per day. (1 drink is 5 oz. wine, 12 oz. beer, or 1.5 oz. liquor.)  Make sure that ANY health care provider who prescribes medication for you knows that you are taking Coumadin (warfarin).  Also make sure the healthcare provider who is monitoring your Coumadin knows when you have started a new medication including herbals and non-prescription products.  Coumadin (Warfarin)  Major Drug Interactions  Increased Warfarin Effect Decreased Warfarin Effect  Alcohol (large quantities) Antibiotics (esp. Septra/Bactrim, Flagyl, Cipro) Amiodarone (Cordarone) Aspirin (ASA) Cimetidine (Tagamet) Megestrol (Megace) NSAIDs (  ibuprofen, naproxen, etc.) Piroxicam (Feldene) Propafenone (Rythmol SR) Propranolol (Inderal) Isoniazid (INH) Posaconazole (Noxafil) Barbiturates (Phenobarbital) Carbamazepine (Tegretol) Chlordiazepoxide (Librium) Cholestyramine (Questran) Griseofulvin Oral Contraceptives Rifampin Sucralfate (Carafate) Vitamin K   Coumadin (Warfarin) Major Herbal Interactions  Increased Warfarin Effect Decreased Warfarin Effect   Garlic Ginseng Ginkgo biloba Coenzyme Q10 Green tea St. Johns wort    Coumadin (Warfarin) FOOD Interactions  Eat a consistent number of servings per week of foods HIGH in Vitamin K (1 serving =  cup)  Collards (cooked, or boiled & drained) Kale (cooked, or boiled & drained) Mustard greens (cooked, or boiled & drained) Parsley *serving size only =  cup Spinach (cooked, or boiled & drained) Swiss chard (cooked, or boiled & drained) Turnip greens (cooked, or boiled & drained)  Eat a consistent number of servings per week of foods MEDIUM-HIGH in Vitamin K (1 serving = 1 cup)  Asparagus (cooked, or boiled & drained) Broccoli (cooked, boiled & drained, or raw & chopped) Brussel sprouts (cooked, or boiled & drained) *serving size only =  cup Lettuce, raw (green leaf, endive, romaine) Spinach, raw Turnip greens, raw & chopped   These websites have more information on Coumadin (warfarin):  FailFactory.se; VeganReport.com.au;   Mitral Valve Repair, Care After Refer to this sheet in the next few weeks. These instructions provide you with information on caring for yourself after your procedure. Your health care provider may also give you specific instructions. Your treatment has been planned according to current medical practices, but problems sometimes occur. Call your health care provider if you have any problems or questions after your procedure.  HOME CARE INSTRUCTIONS   Take medicines only as directed by your health care provider.  Take your temperature every morning for the first 7 days after surgery. Write these down.  Weigh yourself every morning for at least 7 days after surgery. Write your weight down.  Wear elastic stockings during the day for at least 2 weeks after surgery or as directed by your health care provider. Use them longer if your ankles are swollen. The stockings help blood flow and help reduce swelling in the legs.  Take frequent naps or  rest often throughout the day.  Avoid lifting more than 10 lb (4.5 kg) or pushing or pulling things with your arms for 6-8 weeks or as directed by your health care provider.  Avoid driving or airplane travel for 4-6 weeks after surgery or as directed. If you are riding in a car for an extended period, stop every 1-2 hours to stretch your legs.  Avoid crossing your legs.  Avoid climbing stairs and using the handrail to pull yourself up for the first 2-3 weeks after surgery.  Do not take baths for 2-4 weeks after surgery. Take showers once your health care provider approves. Pat incisions dry. Do not rub incisions with a washcloth or towel.  Return to work as directed by your health care provider.  Drink enough fluids to keep your urine clear or pale yellow.  Do not strain to have a bowel movement. Eat high-fiber foods if you become constipated. You may also take a medicine to help you have a bowel movement (laxative) as directed by your health care provider.  Resume sexual activity as directed by your health care provider. SEEK MEDICAL CARE IF:   You develop a skin rash.   Your weight is increasing each day over 2-3 days.  Your weight increases by 2 or more pounds (1 kg) in a single day.  You  have a fever. SEEK IMMEDIATE MEDICAL CARE IF:   You develop chest pain that is not coming from your incision.  You develop shortness of breath or difficulty breathing.  You have drainage, redness, swelling, or pain at your incision site.  You have pus coming from your incision.  You develop light-headedness. MAKE SURE YOU:  Understand these directions.  Will watch your condition.  Will get help right away if you are not doing well or get worse.   This information is not intended to replace advice given to you by your health care provider. Make sure you discuss any questions you have with your health care provider.   Document Released: 02/08/2005 Document Revised: 08/12/2014  Document Reviewed: 12/22/2012 Elsevier Interactive Patient Education [employer].

## 2015-10-09 NOTE — Progress Notes (Signed)
    Subjective:  Denies CP or dyspnea   Objective:  Filed Vitals:   10/09/15 1000 10/09/15 1007 10/09/15 1100 10/09/15 1153  BP:    159/66  Pulse: 55   49  Temp: 97.1 F (36.2 C)   97.5 F (36.4 C)  TempSrc: Oral   Oral  Resp: 16  23 18   Height:      Weight:  234 lb 3.2 oz (106.232 kg)    SpO2: 97%   100%    Intake/Output from previous day:  Intake/Output Summary (Last 24 hours) at 10/09/15 1244 Last data filed at 10/09/15 1145  Gross per 24 hour  Intake    960 ml  Output   1245 ml  Net   -285 ml    Physical Exam: Physical exam: Well-developed well-nourished in no acute distress.  Skin is warm and dry.  HEENT is normal.  Neck is supple.  Chest with diminished BS bases; s/p sternotomy Cardiovascular exam is irregular and bradycardic, 2/6 systolic murmur LSB Abdominal exam nontender or distended. No masses palpated. Extremities show 2+ edema. neuro grossly intact    Lab Results: Basic Metabolic Panel:  Recent Labs  10/06/15 1615  10/08/15 0356 10/09/15 0310  NA 141  < > 138 138  K 3.7  < > 4.4 4.2  CL 106  < > 107 107  CO2  --   < > 25 25  GLUCOSE 124*  < > 145* 127*  BUN 11  < > 20 18  CREATININE 0.81  0.70  < > 0.98 0.90  CALCIUM  --   < > 8.7* 8.7*  MG 2.4  --   --   --   < > = values in this interval not displayed. CBC:  Recent Labs  10/08/15 0356 10/09/15 0310  WBC 8.5 8.1  HGB 9.1* 9.8*  HCT 28.4* 30.1*  MCV 92.8 93.2  PLT 88* 107*     Assessment/Plan:  1 status post mitral valve repair-SAM/LVOT obstruction improved off of ionotropes. Careful with diuresis. 2 postoperative atrial fibrillation-continue coumadin; amiodarone restarted; watch HR given recent bradycardia; if he remains asymptomatic, can DC on amiodarone and proceed with DCCV in 4-6 weeks if atrial fibrillation persists. 3 postoperative volume excess-gentle diuresis; follow closely for worsening SAM.   Kirk Ruths 10/09/2015, 12:44 PM

## 2015-10-09 NOTE — Significant Event (Signed)
Patient ambulated halfway to 2W, then taken in wheelchair. VS stable prior and during the transfer. Patient wore his dentures (top & bottom), no other belongings in 2S that could be taken to new room. Report given to charge RN in 2W. RN Cyril Mourning in room prior to RN leaving, family at beside. Johany Hansman, Therapist, sports.

## 2015-10-09 NOTE — Consult Note (Signed)
   [facility] CM Inpatient Consult   10/09/2015  DARAIN OSSMAN 1929-06-08 [id number]    Went back to bedside to speak with patient's wife, Thoma Mitra, about [facility] Care Management services. Explained [facility] Care Management program. Written consent obtained. Explained that [place] Management will not interfere or replace any other services he may have. Mrs. [place] reports patient is also followed by [facility] program as well. She reports he weighs daily and she describes patient being having the CHF telemonitoring program with [facility]. Mrs. Burgers reports she thinks she will need additional support post hospital discharge for patient. She states that she cares for her 33 year old brother as well.  Mrs Wiencek also reports she is not fully sure if patient will go to short term rehab or not. Will continue to follow and request patient to be assigned to community [facility] RNCM if the disposition is home. If patient does go to SNF instead, asked Mrs. Fogarty to call [facility] when patient eventually goes home and if she feels [place] Management is still needed. She is agreeable to this. [facility] Care Management packet and contact information left at bedside. Made inpatient RNCM aware of the above.   Mrs Kazee requests that she will be the primary contact for post hospital transition of care calls. Best contact information is [phone] as cell. Home number is [phone]. Confirmed Primary Care MD is Dr. Dennard Schaumann.  Marthenia Rolling, MSN-Ed, RN,BSN [facility] Liaison [phone]

## 2015-10-09 NOTE — Progress Notes (Signed)
Nutrition Follow-up  DOCUMENTATION CODES:   Obesity unspecified  INTERVENTION:   Glucerna Shake po TID, each supplement provides 220 kcal and 10 grams of protein  NEW NUTRITION DIAGNOSIS:   Increased nutrient needs related to  (post-op healing) as evidenced by estimated needs, ongoing  GOAL:   Patient will meet greater than or equal to 90% of their needs, progressing  MONITOR:   PO intake, Supplement acceptance, Labs, Weight trends, I & O's  ASSESSMENT:   82 yo male with long-standing history of mitral valve prolapse with mitral regurgitation and chronic diastolic CHF, CAD status post PCI and stenting of the LAD and RCA using drug-eluting stents, recurrent SVT, pulmonary HTN, T2DM, hyperlipidemia, and chronic back pain who was schedule to have a mitral valve repair.   Patient extubated 3/3. Patient mumbling with RD question about appetite. PO intake is variable at 25-100% per flowsheet records. Would benefit from addition of oral nutrition supplements. He asked if "they were good" and seemed agreeable; will order.  Diet Order:  Diet heart healthy/carb modified Room service appropriate?: Yes; Fluid consistency:: Thin  Skin:  Reviewed, no issues  CBG (last 3)   Recent Labs  10/08/15 1620 10/08/15 2124 10/09/15 0908  GLUCAP 126* 111* 116*    Last BM:  3/4  Height:   Ht Readings from Last 1 Encounters:  10/07/15 5\' 7"  (1.702 m)    Weight:   Wt Readings from Last 1 Encounters:  10/09/15 234 lb 3.2 oz (106.232 kg)    Ideal Body Weight:  67.3 kg  BMI:  Body mass index is 36.67 kg/(m^2).  Estimated Nutritional Needs:   Kcal:  2000-2200  Protein:  110-120 gm  Fluid:  2.0-2.2 L  EDUCATION NEEDS:   No education needs identified at this time  Arthur Holms, RD, LDN Pager #: [phone] After-Hours Pager #: [phone]

## 2015-10-09 NOTE — Progress Notes (Signed)
CARDIAC REHAB PHASE I   PRE:  Rate/Rhythm: 11 a. Fib/paced  BP:  Sitting: 113/77        SaO2: 97 RA  MODE:  Ambulation: 100 ft   POST:  Rate/Rhythm: 65 a fib  BP:  Sitting: 151/67         SaO2: 97 RA  Pt up in recliner, needed some assistance to stand. Pt ambulated 100 ft on RA, gait belt, chest tube, pacer, rolling walker, assist x2 (followed with rollator), slow, fairly steady gait, tolerated fair. Pt c/o dizzy spells/weakness, states this is not new, requested to sit and rest x2. VSS. Pt will benefit from PT eval, also may benefit from rollator for home use.  Pt to recliner after walk, feet elevated, call bell within reach. Encouraged IS, additional ambulation x1. Will follow as x2.   [id number] Lenna Sciara, RN, BSN 10/09/2015 2:14 PM

## 2015-10-10 LAB — PROTIME-INR
INR: 1.56 — AB (ref 0.00–1.49)
Prothrombin Time: 18.7 seconds — ABNORMAL HIGH (ref 11.6–15.2)

## 2015-10-10 LAB — CBC
HEMATOCRIT: 32.1 % — AB (ref 39.0–52.0)
HEMOGLOBIN: 10.3 g/dL — AB (ref 13.0–17.0)
MCH: 30.1 pg (ref 26.0–34.0)
MCHC: 32.1 g/dL (ref 30.0–36.0)
MCV: 93.9 fL (ref 78.0–100.0)
Platelets: 124 10*3/uL — ABNORMAL LOW (ref 150–400)
RBC: 3.42 MIL/uL — ABNORMAL LOW (ref 4.22–5.81)
RDW: 13.7 % (ref 11.5–15.5)
WBC: 9.4 10*3/uL (ref 4.0–10.5)

## 2015-10-10 LAB — BASIC METABOLIC PANEL
Anion gap: 9 (ref 5–15)
BUN: 20 mg/dL (ref 6–20)
CALCIUM: 8.9 mg/dL (ref 8.9–10.3)
CO2: 23 mmol/L (ref 22–32)
CREATININE: 1 mg/dL (ref 0.61–1.24)
Chloride: 105 mmol/L (ref 101–111)
GFR calc non Af Amer: >60 mL/min (ref >60)
Glucose, Bld: 140 mg/dL — ABNORMAL HIGH (ref 65–99)
Potassium: 4.7 mmol/L (ref 3.5–5.1)
SODIUM: 137 mmol/L (ref 135–145)

## 2015-10-10 MED ORDER — AMLODIPINE BESYLATE 5 MG PO TABS
5.0000 mg | ORAL_TABLET | Freq: Every day | ORAL | Status: DC
Start: 2015-10-10 — End: 2015-10-11
  Administered 2015-10-10: 5 mg via ORAL
  Filled 2015-10-10: qty 1

## 2015-10-10 MED ORDER — WARFARIN VIDEO
Freq: Once | Status: AC
  Administered 2015-10-10: 15:00:00

## 2015-10-10 MED ORDER — GUAIFENESIN 100 MG/5ML PO SOLN
5.0000 mL | Freq: Four times a day (QID) | ORAL | Status: DC | PRN
  Administered 2015-10-11: 100 mg via ORAL
  Filled 2015-10-10: qty 5

## 2015-10-10 MED ORDER — GUAIFENESIN ER 600 MG PO TB12
600.0000 mg | ORAL_TABLET | Freq: Two times a day (BID) | ORAL | Status: DC
Start: 2015-10-10 — End: 2015-10-10
  Administered 2015-10-10: 600 mg via ORAL
  Filled 2015-10-10: qty 1

## 2015-10-10 MED ORDER — DM-GUAIFENESIN ER 30-600 MG PO TB12: 1.0000 | ORAL_TABLET | Freq: Two times a day (BID) | ORAL | Status: DC

## 2015-10-10 MED ORDER — COUMADIN BOOK
Freq: Once | Status: AC
  Administered 2015-10-10: 15:00:00
  Filled 2015-10-10: qty 1

## 2015-10-10 MED ORDER — ACETAMINOPHEN 500 MG PO TABS
1000.0000 mg | ORAL_TABLET | Freq: Four times a day (QID) | ORAL | Status: DC | PRN
  Administered 2015-10-12: 1000 mg via ORAL
  Filled 2015-10-10: qty 2

## 2015-10-10 MED ORDER — POTASSIUM CHLORIDE CRYS ER 20 MEQ PO TBCR
20.0000 meq | EXTENDED_RELEASE_TABLET | Freq: Every day | ORAL | Status: DC
  Administered 2015-10-11 – 2015-10-13 (×2): 20 meq via ORAL
  Filled 2015-10-10 (×3): qty 1

## 2015-10-10 NOTE — Progress Notes (Signed)
    Subjective:  Denies CP or dyspnea; productive cough   Objective:  Filed Vitals:   10/09/15 1153 10/09/15 1400 10/09/15 2144 10/10/15 0614  BP: 159/66 151/67 139/56 135/71  Pulse: 49  61 61  Temp: 97.5 F (36.4 C)  98.4 F (36.9 C) 97.9 F (36.6 C)  TempSrc: Oral  Oral Oral  Resp: 18  18   Height:      Weight:    233 lb 0.4 oz (105.7 kg)  SpO2: 100%  98% 94%    Intake/Output from previous day:  Intake/Output Summary (Last 24 hours) at 10/10/15 [id number] Last data filed at 10/10/15 0617  Gross per 24 hour  Intake    360 ml  Output    735 ml  Net   -375 ml    Physical Exam: Physical exam: Well-developed well-nourished in no acute distress.  Skin is warm and dry.  HEENT is normal.  Neck is supple.  Chest with diminished BS bases; s/p sternotomy Cardiovascular exam is irregular and bradycardic, 2/6 systolic murmur LSB Abdominal exam nontender or distended. No masses palpated. Extremities show 2+ edema. neuro grossly intact    Lab Results: Basic Metabolic Panel:  Recent Labs  10/09/15 0310 10/10/15 0330  NA 138 137  K 4.2 4.7  CL 107 105  CO2 25 23  GLUCOSE 127* 140*  BUN 18 20  CREATININE 0.90 1.00  CALCIUM 8.7* 8.9   CBC:  Recent Labs  10/09/15 0310 10/10/15 0330  WBC 8.1 9.4  HGB 9.8* 10.3*  HCT 30.1* 32.1*  MCV 93.2 93.9  PLT 107* 124*     Assessment/Plan:  1 status post mitral valve repair-SAM/LVOT obstruction improved off of ionotropes but murmur noted on exam. Careful with diuresis. 2 postoperative atrial fibrillation-continue coumadin and amiodarone; watch HR given intermittent bradycardia/need for pacing; if he remains asymptomatic, can DC on amiodarone and proceed with DCCV in 4-6 weeks if atrial fibrillation persists. Note atrial fibrillation would exacerbate LVOT obstruction due to loss of atrial kick and reduced preload. 3 postoperative volume excess-gentle diuresis; follow closely for worsening SAM.   Kirk Ruths 10/10/2015,  8:22 AM

## 2015-10-10 NOTE — Progress Notes (Addendum)
      [address]       [place] [address]             [phone]        5 Days Post-Op Procedure(s) (LRB): Sternal Incision (N/A) INTRAOPERATIVE TRANSESOPHAGEAL ECHOCARDIOGRAM  Subjective: Has productive cough  Objective: Vital signs in last 24 hours: Temp:  [96.3 F (35.7 C)-98.4 F (36.9 C)] 97.9 F (36.6 C) (03/07 0614) Pulse Rate:  [49-61] 61 (03/07 0614) Cardiac Rhythm:  [-] Ventricular paced;Atrial fibrillation (03/06 1900) Resp:  [16-23] 18 (03/06 2144) BP: (135-159)/(56-72) 135/71 mmHg (03/07 0614) SpO2:  [94 %-100 %] 94 % (03/07 0614) Weight:  [233 lb 0.4 oz (105.7 kg)-234 lb 3.2 oz (106.232 kg)] 233 lb 0.4 oz (105.7 kg) (03/07 0614)  Pre op weight 100 kg Current Weight  10/10/15 233 lb 0.4 oz (105.7 kg)      Intake/Output from previous day: 03/06 0701 - 03/07 0700 In: 360 [P.O.:360] Out: 735 [Urine:375; Chest Tube:360]   Physical Exam:  Cardiovascular: IRRR IRRR, murmur Pulmonary: Diminished at bases bilaterally; no rales, wheezes, or rhonchi. Abdomen: Soft, non tender, bowel sounds present. Extremities: ++ bilateral lower extremity edema, chronic venous stasis changes Wound: Clean and dry.  No erythema or signs of infection.  Lab Results: CBC: Recent Labs  10/09/15 0310 10/10/15 0330  WBC 8.1 9.4  HGB 9.8* 10.3*  HCT 30.1* 32.1*  PLT 107* 124*   BMET:  Recent Labs  10/09/15 0310 10/10/15 0330  NA 138 137  K 4.2 4.7  CL 107 105  CO2 25 23  GLUCOSE 127* 140*  BUN 18 20  CREATININE 0.90 1.00  CALCIUM 8.7* 8.9    PT/INR:  Lab Results  Component Value Date   INR 1.56* 10/10/2015   INR 1.36 10/09/2015   INR 1.31 10/08/2015   ABG:  INR: Will add last result for INR, ABG once components are confirmed Will add last 4 CBG results once components are confirmed  Assessment/Plan:  1. CV - SAM/LVOT. Paced,A fib. On Amiodarone 200 mg bid. Will restart Norvasc 5 mg for better BP control. On Coumadin as well. INR  slightly increased from 1.36 to 1.56. 2.  Pulmonary - On room air. Mucinex for cough. Chest tubes with 360 cc last 24 hours. Will remove once output decreased. Encourage incentive spirometer 3. Volume Overload - On Lasix 40 mg daily 4.  Acute blood loss anemia - H and H stable at 10.3 and 32.1 5. Mild thrombocytopenia-platlelets up to 124,000. 6.DM-pre op HGA1C 6.8  [name],DONIELLE MPA-C 10/10/2015,8:38 AM  I have seen and examined the patient and agree with the assessment and plan as outlined.  D/C chest tubes today.  Mobilize.   Rexene Alberts, MD 10/10/2015 10:05 AM

## 2015-10-10 NOTE — Evaluation (Signed)
Physical Therapy Evaluation Patient Details Name: [name] MRN: [id number] DOB: [birth year] Today's Date: 10/10/2015   History of Present Illness  Patient is a 82 y/o male with hx of HTN, HLD, gout, CHF, CAD, DM and chronic back pain presents s/p minimally invasive MVR.  Clinical Impression  Patient presents with decreased strength, endurance, balance and bradycardia s/p above surgery impacting safe mobility. Requires Min A to stand from surfaces due to inability to use UEs for support. Ambulation distance limited today as pt tired and wanting to return to bed. Reviewed sternal precautions. Encouraged ambulation 3x/day with RN. Will follow acutely to maximize independence and mobility prior to return home.    Follow Up Recommendations Home health PT;Supervision for mobility/OOB    Equipment Recommendations  None recommended by PT    Recommendations for Other Services OT consult     Precautions / Restrictions Precautions Precautions: Fall;Sternal Restrictions Weight Bearing Restrictions: No      Mobility  Bed Mobility Overal bed mobility: Needs Assistance Bed Mobility: Rolling;Sit to Sidelying Rolling: Min assist       Sit to sidelying: Min assist General bed mobility comments: Cues for log roll technique and assist to bring LEs into bed and to roll.   Transfers Overall transfer level: Needs assistance Equipment used: Rolling walker (2 wheeled) Transfers: Sit to/from Stand Sit to Stand: Min assist         General transfer comment: Min A to boost from chair with cues for hand placement, use of body momentum and anterior translation. CUes to hold heart pillow to stand from chair to adhere to precautions.  Ambulation/Gait Ambulation/Gait assistance: Min guard Ambulation Distance (Feet): 20 Feet Assistive device: Rolling walker (2 wheeled) Gait Pattern/deviations: Step-through pattern;Decreased stride length;Trunk flexed Gait velocity: decreased   General Gait  Details: Cues for upright posture. Pt bradycardic with HR in the 50s.  Declined further ambulation as pt wanting to get back in bed.  Stairs            Wheelchair Mobility    Modified Rankin (Stroke Patients Only)       Balance Overall balance assessment: Needs assistance Sitting-balance support: Feet supported;No upper extremity supported Sitting balance-Leahy Scale: Fair     Standing balance support: During functional activity Standing balance-Leahy Scale: Fair Standing balance comment: Able to stand unsupported holding heart pillowfor a few seconds; requires UE support during dynamic standing/ambulation.                             Pertinent Vitals/Pain Pain Assessment: No/denies pain    Home Living Family/patient expects to be discharged to:: Private residence Living Arrangements: Spouse/significant other Available Help at Discharge: Family;Available 24 hours/day Type of Home: House Home Access: Ramped entrance     Home Layout: Two level Home Equipment: Walker - 2 wheels;Cane - single point;Bedside commode;Other (comment);Shower seat (adjustable bed)      Prior Function Level of Independence: Independent with assistive device(s)         Comments: Uses SPC PRN. Drives.      Hand Dominance   Dominant Hand: Right    Extremity/Trunk Assessment   Upper Extremity Assessment: Defer to OT evaluation           Lower Extremity Assessment: Generalized weakness         Communication   Communication: No difficulties (talks fast and mumbles at times. )  Cognition Arousal/Alertness: Awake/alert Behavior During Therapy: WFL for tasks assessed/performed Overall  Cognitive Status: Within Functional Limits for tasks assessed                      General Comments General comments (skin integrity, edema, etc.): Wife present during session.    Exercises        Assessment/Plan    PT Assessment Patient needs continued PT services   PT Diagnosis Generalized weakness;Difficulty walking   PT Problem List Decreased strength;Cardiopulmonary status limiting activity;Decreased activity tolerance;Decreased balance;Decreased mobility;Decreased knowledge of precautions  PT Treatment Interventions Balance training;Gait training;Stair training;Functional mobility training;Therapeutic activities;Therapeutic exercise;Patient/family education   PT Goals (Current goals can be found in the Care Plan section) Acute Rehab PT Goals Patient Stated Goal: to return home and to independence PT Goal Formulation: With patient Time For Goal Achievement: 10/24/15 Potential to Achieve Goals: Good    Frequency Min 3X/week   Barriers to discharge        Co-evaluation               End of Session   Activity Tolerance: Patient tolerated treatment well Patient left: in bed;with call bell/phone within reach;with bed alarm set;with family/visitor present Nurse Communication: Mobility status         Time: [id number] PT Time Calculation (min) (ACUTE ONLY): 17 min   Charges:   PT Evaluation $PT Eval Moderate Complexity: 1 Procedure     PT G Codes:        [name] 10/10/2015, 10:37 AM  Wray Kearns, PT, DPT [phone]

## 2015-10-10 NOTE — Progress Notes (Signed)
Patient was refusing meds due to swallowing. Was offered apple sauce and refused. Patient friend encouraged him to take evening meds  in apple sauce.

## 2015-10-10 NOTE — Progress Notes (Signed)
CARDIAC REHAB PHASE I   PRE:  Rate/Rhythm: 54 afib/pacing    BP: sitting 94/69    SaO2: 94 RA  MODE:  Ambulation: 150 ft   POST:  Rate/Rhythm: 70 afib    BP: sitting 145/81     SaO2: 100 RA  Pt stronger today, stood more upright with walking. Used rollator so pt could have a place to sit. Pt needed to sit after a few feet due to his "SOB episodes". Rested then walked around circle with x1 more rest stop. Pt unsafe with sitting on rollator, trying to sit too early. Discussed this with pt. To recliner after walk. Tolerated walk better today however he is not ready to go home with his wife. Pts wife sts he does not listen to her well.  [place], ACSM 10/10/2015 3:02 PM

## 2015-10-11 ENCOUNTER — Other Ambulatory Visit (HOSPITAL_COMMUNITY): Payer: Medicare Other

## 2015-10-11 ENCOUNTER — Inpatient Hospital Stay (HOSPITAL_COMMUNITY): Payer: Medicare Other

## 2015-10-11 LAB — PROTIME-INR
INR: 1.9 — ABNORMAL HIGH (ref 0.00–1.49)
Prothrombin Time: 21.7 seconds — ABNORMAL HIGH (ref 11.6–15.2)

## 2015-10-11 MED ORDER — AMLODIPINE BESYLATE 5 MG PO TABS
5.0000 mg | ORAL_TABLET | Freq: Every day | ORAL | Status: DC
  Administered 2015-10-11 – 2015-10-12 (×2): 5 mg via ORAL
  Filled 2015-10-11 (×2): qty 1

## 2015-10-11 NOTE — Progress Notes (Addendum)
      [address]       [place] [address]             [phone]        6 Days Post-Op Procedure(s) (LRB): Sternal Incision (N/A) INTRAOPERATIVE TRANSESOPHAGEAL ECHOCARDIOGRAM  Subjective: Patient states he is doing "ok" this am.  Objective: Vital signs in last 24 hours: Temp:  [97.3 F (36.3 C)-98.5 F (36.9 C)] 97.9 F (36.6 C) (03/08 0428) Pulse Rate:  [56-94] 60 (03/08 0744) Cardiac Rhythm:  [-] Atrial fibrillation;Ventricular paced;Other (Comment) (03/07 1900) Resp:  [17-19] 18 (03/08 0428) BP: (113-158)/(51-90) 129/90 mmHg (03/08 0744) SpO2:  [95 %-98 %] 95 % (03/08 0428) Weight:  [230 lb 12.8 oz (104.69 kg)] 230 lb 12.8 oz (104.69 kg) (03/08 0428)  Pre op weight 100 kg Current Weight  10/11/15 230 lb 12.8 oz (104.69 kg)      Intake/Output from previous day: 03/07 0701 - 03/08 0700 In: 3 [I.V.:3] Out: 380 [Chest Tube:380]   Physical Exam:  Cardiovascular: IRRR IRRR, murmur Pulmonary: Diminished at bases bilaterally; no rales, wheezes, or rhonchi. Abdomen: Soft, non tender, bowel sounds present. Extremities: ++ bilateral lower extremity edema, chronic venous stasis changes Wound: Clean and dry.  No erythema or signs of infection.  Lab Results: CBC:  Recent Labs  10/09/15 0310 10/10/15 0330  WBC 8.1 9.4  HGB 9.8* 10.3*  HCT 30.1* 32.1*  PLT 107* 124*   BMET:   Recent Labs  10/09/15 0310 10/10/15 0330  NA 138 137  K 4.2 4.7  CL 107 105  CO2 25 23  GLUCOSE 127* 140*  BUN 18 20  CREATININE 0.90 1.00  CALCIUM 8.7* 8.9    PT/INR:  Lab Results  Component Value Date   INR 1.90* 10/11/2015   INR 1.56* 10/10/2015   INR 1.36 10/09/2015   ABG:  INR: Will add last result for INR, ABG once components are confirmed Will add last 4 CBG results once components are confirmed  Assessment/Plan:  1. CV - SAM/LVOT. Paced,A fib. On Amiodarone 200 mg bid, and Norvasc 5 mg for better BP control. On Coumadin as well. INR  slightly increased from 1.56 to 1.9. Await echo results. 2.  Pulmonary - On room air. Robitussin for cough. Chest tubes with 380 cc last 24 hours. Will remove today. Encourage incentive spirometer. Check CXR in am 3. Volume Overload - On Lasix 40 mg daily 4.  Acute blood loss anemia - H and H stable at 10.3 and 32.1 5. Mild thrombocytopenia-platlelets up to 124,000. 6.DM-pre op HGA1C 6.8  [name],DONIELLE MPA-C 10/11/2015,8:46 AM  I have seen and examined the patient and agree with the assessment and plan as outlined.  Chest tubes weren't removed yesterday as ordered.  Will d/c them today.  Check f/u ECHO, also ordered yesterday but not done.  Rexene Alberts, MD 10/11/2015 9:18 AM

## 2015-10-11 NOTE — Progress Notes (Signed)
Pt. Refuses to let me pull wires until he has spoke with MD, due to fear of going home. I have explained to him that this does not mean that he will be discharged today Tacy Dura came to talk to patient.

## 2015-10-11 NOTE — Progress Notes (Signed)
PT Cancellation Note  Patient Details Name: [name] MRN: [id number] DOB: 08-24-1928   Cancelled Treatment:    Reason Eval/Treat Not Completed: Patient declined, no reason specified Pt declined working with PT this afternoon, "I am sick,. I just can't do it." Explained importance of mobility but pt continued to refuse. Will follow up.   [name] 10/11/2015, 4:24 PM Wray Kearns, [place], DPT [phone]

## 2015-10-11 NOTE — Progress Notes (Signed)
Wire remove no reistance patient states felt nothing intimal BP after removal was 128/66. Kendria Halberg Doree Fudge

## 2015-10-11 NOTE — Progress Notes (Signed)
CARDIAC REHAB PHASE I   PRE:  Rate/Rhythm: 65 afib    BP: sitting 148/71    SaO2: 96 RA  MODE:  Ambulation: 150 ft   POST:  Rate/Rhythm: 83 afib    BP: sitting 134/69     SaO2: 93 RA  Pt refused ambulation earlier this am but now ready to walk. Gave pt reminders for sternal precautions with getting to edge of bed. Able to stand with min assist using gait belt. Assist x2 with pt using rollator. Encouraged pt at first of walk to focus on his breathing and to not immediately think he needs to sit when he feels SOB. He was able to walk slowly with focus. Did not ask to sit until 100 ft which he was able to do with more control today. To recliner after walk, no c/o. Will f/u tomorrow. [place], ACSM 10/11/2015 12:25 PM

## 2015-10-12 ENCOUNTER — Other Ambulatory Visit: Payer: Self-pay | Admitting: *Deleted

## 2015-10-12 ENCOUNTER — Inpatient Hospital Stay (HOSPITAL_COMMUNITY): Payer: Medicare Other

## 2015-10-12 DIAGNOSIS — I340 Nonrheumatic mitral (valve) insufficiency: Secondary | ICD-10-CM

## 2015-10-12 LAB — BASIC METABOLIC PANEL
Anion gap: 14 (ref 5–15)
BUN: 13 mg/dL (ref 6–20)
CHLORIDE: 100 mmol/L — AB (ref 101–111)
CO2: 24 mmol/L (ref 22–32)
CREATININE: 0.92 mg/dL (ref 0.61–1.24)
Calcium: 8.9 mg/dL (ref 8.9–10.3)
GFR calc non Af Amer: >60 mL/min (ref >60)
Glucose, Bld: 138 mg/dL — ABNORMAL HIGH (ref 65–99)
POTASSIUM: 3.8 mmol/L (ref 3.5–5.1)
Sodium: 138 mmol/L (ref 135–145)

## 2015-10-12 LAB — ECHOCARDIOGRAM COMPLETE
HEIGHTINCHES: 67 in
Weight: 3638.4 oz

## 2015-10-12 LAB — PROTIME-INR
INR: 2.38 — ABNORMAL HIGH (ref 0.00–1.49)
Prothrombin Time: 25.7 seconds — ABNORMAL HIGH (ref 11.6–15.2)

## 2015-10-12 MED ORDER — WARFARIN SODIUM 2.5 MG PO TABS
2.5000 mg | ORAL_TABLET | Freq: Every day | ORAL | Status: DC
  Administered 2015-10-12: 2.5 mg via ORAL
  Filled 2015-10-12: qty 1

## 2015-10-12 MED ORDER — METOPROLOL TARTRATE 12.5 MG HALF TABLET
12.5000 mg | ORAL_TABLET | Freq: Two times a day (BID) | ORAL | Status: DC
  Administered 2015-10-12 (×2): 12.5 mg via ORAL
  Filled 2015-10-12 (×2): qty 1

## 2015-10-12 MED ORDER — POTASSIUM CHLORIDE CRYS ER 20 MEQ PO TBCR
20.0000 meq | EXTENDED_RELEASE_TABLET | Freq: Once | ORAL | Status: AC
  Filled 2015-10-12: qty 1

## 2015-10-12 NOTE — Discharge Summary (Signed)
Physician Discharge Summary  Patient ID: [name] MRN: [id number] DOB/AGE: 1929-08-03 82 y.o.  Admit date: 10/04/2015 Discharge date: 10/13/2015  Admission Diagnoses:  Patient Active Problem List   Diagnosis Date Noted  . Hypertensive left ventricular hypertrophy with heart failure ([place]) 10/04/2015  . Physical deconditioning   . Hypertensive cardiomyopathy ([place])   . Hemangioma of liver 08/02/2015  . Chronic diastolic (congestive) heart failure ([place])   . Diabetes mellitus, type II ([place])   . Mitral regurgitation   . Coronary artery disease   . Pulmonary HTN ([place])   . Lower extremity edema   . HLD (hyperlipidemia)   . SVT (supraventricular tachycardia) ([place]) 06/04/2015  . Type II diabetes mellitus, uncontrolled ([place]) 03/30/2015  . Severe mitral regurgitation 11/17/2014  . History of PSVT (paroxysmal supraventricular tachycardia) 11/15/2014  . Chronic renal disease, stage III 11/15/2014  . Dyslipidemia 11/15/2014  . History of prostate cancer 11/15/2014  . Dizziness and giddiness 10/20/2013  . Dry eye 09/03/2013  . Dyspnea on exertion 06/29/2013  . Peripheral edema 06/16/2013  . Pulmonary HTN ([place]) 02/12/2013  . TBI (traumatic brain injury) ([place]) 11/25/2012  . Orbit fracture ([place]) 11/25/2012  . Maxillary sinus fracture ([place]) 11/25/2012  . Sinusitis 11/25/2012  . Frequent falls 11/25/2012  . OA (osteoarthritis)   . Metabolic syndrome   . ED (erectile dysfunction)   . Chronic fatigue   . Gout   . Bowing of leg   . Spinal stenosis   . Hypertension 05/07/2012  . Dysphagia 05/07/2012   Discharge Diagnoses:   Patient Active Problem List   Diagnosis Date Noted  . S/P minimally invasive mitral valve repair 10/04/2015  . Hypertensive left ventricular hypertrophy with heart failure ([place]) 10/04/2015  . Physical deconditioning   . Hypertensive cardiomyopathy ([place])   . Hemangioma of liver 08/02/2015  . Chronic diastolic (congestive) heart failure ([place])   . Diabetes  mellitus, type II ([place])   . Mitral regurgitation   . Coronary artery disease   . Pulmonary HTN ([place])   . Lower extremity edema   . HLD (hyperlipidemia)   . SVT (supraventricular tachycardia) ([place]) 06/04/2015  . Type II diabetes mellitus, uncontrolled ([place]) 03/30/2015  . Severe mitral regurgitation 11/17/2014  . History of PSVT (paroxysmal supraventricular tachycardia) 11/15/2014  . Chronic renal disease, stage III 11/15/2014  . Dyslipidemia 11/15/2014  . History of prostate cancer 11/15/2014  . Dizziness and giddiness 10/20/2013  . Dry eye 09/03/2013  . Dyspnea on exertion 06/29/2013  . Peripheral edema 06/16/2013  . Pulmonary HTN ([place]) 02/12/2013  . TBI (traumatic brain injury) ([place]) 11/25/2012  . Orbit fracture ([place]) 11/25/2012  . Maxillary sinus fracture ([place]) 11/25/2012  . Sinusitis 11/25/2012  . Frequent falls 11/25/2012  . OA (osteoarthritis)   . Metabolic syndrome   . ED (erectile dysfunction)   . Chronic fatigue   . Gout   . Bowing of leg   . Spinal stenosis   . Hypertension 05/07/2012  . Dysphagia 05/07/2012   Discharged Condition: good  History of Present Illness:  Mr. Madge is an 82 yo white male with long-standing history of mitral valve prolapse with mitral regurgitation and chronic diastolic congestive heart failure, coronary artery disease status post PCI and stenting of the LAD and RCA using drug-eluting stents, recurrent SVT, pulmonary hypertension, type 2 diabetes mellitus, hyperlipidemia, and chronic back pain.  The patient states that he has been told that he had a heart murmur for nearly all of his life. For at least the past  10 years the patient has also had intermittent history of palpitations that were eventually diagnosed as paroxysmal SVT. He has been followed by Dr. Percival Spanish since 2014 with primary complaints of exertional fatigue and lower extremity edema. Transthoracic echocardiogram performed April 2014 was reported to demonstrate the presence of  mild mitral regurgitation with normal left ventricular systolic function. The patient was admitted to the hospital in April 2016 with acute exacerbation of chronic diastolic congestive heart failure with primary complaint of worsening lower extremity edema despite escalating doses of diuretic therapy. Transthoracic echocardiogram performed at that time revealed the presence of a mobile mass on the each relief of the mitral valve that was felt to be li, coronary lobekely chronic flail segment with at least moderate mitral regurgitation. Transesophageal echocardiogram was recommended. Troponin levels were slightly elevated but remained flat during that admission. Nuclear stress test was felt to be intermediate risk. The patient underwent diagnostic cardiac catheterization by Dr. Claiborne Billings. He was found to have severe two-vessel coronary artery disease and treated with multivessel PCI and stenting using drug eluding stents in both the LAD and RCA territories. Right heart catheterization was not performed. Transesophageal echocardiogram was not performed. The patient was not referred for surgical consultation at that time.  The patient stated that he has not felt any better since he underwent PCI and stenting. He continued to complain primarily of increasing fatigue with increasing bilateral lower extremity edema. He also reports some exertional shortness of breath. He denies any history of resting shortness of breath, PND, orthopnea, or lower extremity edema. He was seen in follow-up with Dr. Percival Spanish in late October and subsequent transthoracic echocardiogram revealed normal left ventricular systolic function with likely flail segment of the anterior leaflet of the mitral valve and severe mitral regurgitation. Shortly after the echocardiogram was performed the patient was hospitalized briefly with an episode of persistent SVT that failed adenosine and required DC cardioversion.  He underwent transesophageal  echocardiogram by Dr. Oval Linsey on 06/22/2015. 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 There was normal left ventricular systolic function with ejection fraction estimated 60-65%. There was no sign of thrombus in the left atrial cavity or left atrial appendage. There was normal right ventricular size and systolic function. There was mild tricuspid regurgitation. The patient was referred for surgical consultation.  He was evaluated by Dr. Roxy Manns who was in agreement the patient would require surgical intervention on his mitral valve.  It was felt it would likely be able to be repaired through a minimally invasive approach.  The risks and benefits of the procedure were explained to the patient and he was agreeable to proceed.    Hospital Course:   Mr. Abukar presented to [facility] on 10[facility]04/2015.  He was taken to the operating room and underwent Minimally invasive Mitral Valve Repair.  He developed SAM early after weaning from cardiopulmonary bypass which drastically improved with volume loading and decreased heart rate.  He was taken to the SICU in stable condition.  During his stay in the SICU the patient developed hemodynamics suggestive of SAM with decreased systolic BP on IV Levophed, increased pulmonary hypertension and systolic murmur on exam.  STAT bedside ECHO was obtained which showed moderate systolic anterior motion of the anterior leaflet which was causing severe subvalvular obstruction.  The findings are consistent with mild to moderate stenosis.  He was taken off Milrinone and started on Beta  blocker.  It was felt the patient would require return to the operating room for possible intervention of the Mitral Valve.  Skin incision was made, but after review of the TEE with Cardiology, it was felt the mitral valve appears intact, there is no sign of ongoing systolic anterior motion of  the mitral valve, and there is no significant gradient across the left ventricular output tract. Therefore, further surgical intervention was not warranted.  The patient was weaned and extubated on POD #2.  He showed no further signs of SAM or LVOTO.  He was hemodynamically stable on Neo-synephrine drip.  He developed 3rd degree heart block and his Cardizem and Lopressor doses were decreased.  His heart block further progressed and his beta blocker was discontinued.  He had an atrial tracing done which showed the patient to be in Atrial Fibrillation or flutter and he was started on Amiodarone.  He remained on temporary back up pacing.  He was treated with coumadin post valve repair.  He was felt medically stable for transfer to the step down unit on POD #5. The patient continued to make progress.  His chest tube output had decreased and were able to be removed on POD #.  He was started on Norvasc for hypertension.  Repeat Echocardiogram was obtained and was showed showed LVEF 60-65%, mild MS and trivial MR . He has remained in Atrial Fibrillation and his temporary pacing wires were removed without difficulty.  He was restarted on low dose Beta Blocker. He had been previously restarted on Norvasc as taken pre op. This was stopped and Lopressor was increased to 25 mg bid.  His INR is up to 3.66. He will NOT receive any Coumadin tonight. As discussed with Dr. Roxy Manns, he will be discharged home on 2 mg of Coumadin daily.   His PT and INR will be checked on Monday 10/16/2015 by [facility]. His INR should be between 2-2.5. He will require daily Lasix and may need to have dosage increased. He was instructed to weight himself daily. If he gains a couple of pounds or gets worsening shortness of breath, he is to call office asap. He is ambulating with minimal assistance.  He is tolerating a heart healthy diet.  He is felt medically stable for discharge home today.   Consults: cardiology  Significant Diagnostic Studies: cardiac  graphics:   Echocardiogram: Preoperative -Left ventricle: The cavity size was normal. There was moderate  concentric hypertrophy. Systolic function was hyperdynamic. The  estimated ejection fraction was in the range of 75% to 80%. Wall  motion was normal; there were no regional wall motion  abnormalities. Features are consistent with a pseudonormal left  ventricular filling pattern, with concomitant abnormal relaxation  and increased filling pressure (grade 2 diastolic dysfunction). - Aortic valve: There was mild regurgitation. - Mitral valve: Calcified annulus. Mildly thickened leaflets .  Flail motion involving a segment of the anterior leaflet. This  causes the appearance of a &quot;mass&quot; on the anterior leaflet. There  was severe regurgitation directed eccentrically and posteriorly.  Severe regurgitation is suggested by pulmonary vein systolic flow  reversal. - Left atrium: The atrium was severely dilated. - Right ventricle: The cavity size was mildly dilated. Wall  thickness was normal. - Right atrium: The atrium was moderately to severely dilated. - Tricuspid valve: There was moderate regurgitation. - Pulmonic valve: There was moderate regurgitation. - Pulmonary arteries: Systolic pressure was moderately increased.  PA peak pressure: 46 mm Hg (S).  Post operative ECHO: -  Treatments: surgery:    Minimally-Invasive Mitral Valve Repair Complex valvuloplasty including artificial Gore-tex neochord placement x6 Primary repair of perforation of anterior leaflet Plication closure of cleft between P2 and P3 portions of posterior leaflet Sorin Memo 3D Ring Annuloplasty (size 41mm, catalog # [id number], serial # [id number])  Disposition: 01-Home or Self Care        Discharge Instructions    AMB Referral to [facility]    Complete by:  As directed   Please assign to [facility] for transition of care.  Multiple co-morbidities including CHF, DM, HTN. S/p MVR. Written consent signed. Wife primary contact, Dyrell Cowher at [phone] cell and home number is [phone]. Likely discharge soon. Thanks and please call with questions. Marthenia Rolling, [place], RN,BSN-THN [facility] [phone]  Reason for consult:  Please assign to [facility] RNCM  Expected date of contact:  1-3 days (reserved for hospital discharges)         Discharge Medications:      Medication List    STOP taking these medications        amLODipine 5 MG tablet  Commonly known as:  NORVASC     diltiazem 120 MG 24 hr capsule  Commonly known as:  CARDIZEM CD     GARLIC PO     ticagrelor 90 MG Tabs tablet  Commonly known as:  BRILINTA      TAKE these medications        acetaminophen 500 MG tablet  Commonly known as:  TYLENOL  Take 2 tablets (1,000 mg total) by mouth every 6 (six) hours as needed for mild pain or fever.     amiodarone 200 MG tablet  Commonly known as:  PACERONE  Take 1 tablet (200 mg total) by mouth 2 (two) times daily with a meal. For 5 days then take Amiodarone 200 mg by mouth daily thereafter     aspirin EC 81 MG tablet  Take 81 mg by mouth daily.     feeding supplement (GLUCERNA SHAKE) Liqd  Take 237 mLs by mouth 3 (three) times daily between meals. Take until tolerating a diet better     finasteride 5 MG tablet  Commonly known as:  PROSCAR  TAKE ONE (1) TABLET EACH DAY     fluticasone 50 MCG/ACT nasal spray  Commonly known as:  FLONASE  Place 1 spray into both nostrils daily as needed for allergies or rhinitis.     furosemide 40 MG tablet  Commonly known as:  LASIX  Take 1 tablet (40 mg total) by mouth daily.     metoprolol tartrate 25 MG tablet  Commonly known as:  LOPRESSOR  Take 1 tablet (25 mg total) by mouth 2 (two) times daily.     MUSCLE RUB 10-15 % Crea  Apply 1 application topically 2 (two) times daily as needed for muscle pain.     potassium  chloride SA 20 MEQ tablet  Commonly known as:  K-DUR,KLOR-CON  Take 1 tablet (20 mEq total) by mouth daily.     pravastatin 40 MG tablet  Commonly known as:  PRAVACHOL  Take 1 tablet (40 mg total) by mouth daily at 6 PM.     tamsulosin 0.4 MG Caps capsule  Commonly known as:  FLOMAX  Take 1 capsule (0.4 mg total) by mouth daily.     traMADol 50 MG tablet  Commonly known as:  ULTRAM  Take 1 tablet (50 mg total) by mouth every 6 (six) hours as needed  for moderate pain.     warfarin 2 MG tablet  Commonly known as:  COUMADIN  Take 1 tablet (2 mg total) by mouth daily at 6 PM. Or as directed  Start taking on:  10/14/2015        The patient has been discharged on:   1.Beta Blocker:  Yes [x   ]                              No   [   ]                              If No, reason:  2.Ace Inhibitor/ARB: Yes [   ]                                     No  [  x  ]                                     If No, reason:Labile BP  3.Statin:   Yes [ x  ]                  No  [   ]                  If No, reason:  4.Ecasa:  Yes  [ x  ]                  No   [   ]                  If No, reason:  Follow-up Information    Follow up with Rexene Alberts, MD On 10/23/2015.   Specialty:  Cardiothoracic Surgery   Why:  Appointment is at 11:00 am   Contact information:   [place] [address] [phone]       Follow up with [facility] On 10/23/2015.   Why:  Please get CXR at 4:00, located on first floor of our office building   Contact information:   [facility]       Follow up with Eileen Stanford, PA-C On 11/02/2015.   Specialties:  Cardiology, Radiology   Why:  Appointment time is at 10:30 am   Contact information:   [address] [place] [id number] [phone]       Follow up with [facility].   Why:  Home Health RN and Physical Therapy   Contact information:   [address] [place]  [place] [address] [phone]       Follow up with Home Health.   Why:  Please draw PT and INR (as is on Coumadin, INR 2-2.5) on Monday 10/16/2015 and call or fax results to Dr. Rosezella [place] office      Signed: Nani Skillern PA-C 10/13/2015, 12:39 PM

## 2015-10-12 NOTE — Progress Notes (Addendum)
      [address]       [place] [address]             [phone]        7 Days Post-Op Procedure(s) (LRB): Sternal Incision (N/A) INTRAOPERATIVE TRANSESOPHAGEAL ECHOCARDIOGRAM  Subjective: Patient's only complaint is not being able to sleep.  Objective: Vital signs in last 24 hours: Temp:  [97.8 F (36.6 C)-97.9 F (36.6 C)] 97.9 F (36.6 C) (03/09 0552) Pulse Rate:  [67-81] 70 (03/09 0552) Cardiac Rhythm:  [-] Ventricular paced (03/08 2030) Resp:  [16-18] 18 (03/09 0552) BP: (127-148)/(57-71) 133/66 mmHg (03/09 0552) SpO2:  [93 %-95 %] 93 % (03/09 0552) Weight:  [227 lb 6.4 oz (103.148 kg)] 227 lb 6.4 oz (103.148 kg) (03/09 0521)  Pre op weight 100 kg Current Weight  10/12/15 227 lb 6.4 oz (103.148 kg)      Intake/Output from previous day: 03/08 0701 - 03/09 0700 In: 480 [P.O.:480] Out: -    Physical Exam:  Cardiovascular: IRRR IRRR, murmur Pulmonary: Diminished at bases bilaterally; no rales, wheezes, or rhonchi. Abdomen: Soft, non tender, bowel sounds present. Extremities: ++ bilateral lower extremity edema, chronic venous stasis changes Wound: Clean and dry.  No erythema or signs of infection.  Lab Results: CBC:  Recent Labs  10/10/15 0330  WBC 9.4  HGB 10.3*  HCT 32.1*  PLT 124*   BMET:   Recent Labs  10/10/15 0330 10/12/15 0309  NA 137 138  K 4.7 3.8  CL 105 100*  CO2 23 24  GLUCOSE 140* 138*  BUN 20 13  CREATININE 1.00 0.92  CALCIUM 8.9 8.9    PT/INR:  Lab Results  Component Value Date   INR 2.38* 10/12/2015   INR 1.90* 10/11/2015   INR 1.56* 10/10/2015   ABG:  INR: Will add last result for INR, ABG once components are confirmed Will add last 4 CBG results once components are confirmed  Assessment/Plan:  1. CV - SAM/LVOT. Paced,A fib. On Amiodarone 200 mg bid, and Norvasc 5 mg for better BP control. On Coumadin as well. INR slightly increased from 1.9 to 2.38. Will decrease Coumadin to 2.5 mg  daily.Await echo results. Per Dr. Roxy Manns, will start low dose BB. 2.  Pulmonary - On room air. CXR this am shows trace right apical pneumothorax, small bilateral pleural effusions.Will remove today. Encourage incentive spirometer.  3. Volume Overload - On Lasix 40 mg daily 4.  Acute blood loss anemia - H and H stable at 10.3 and 32.1 5. Mild thrombocytopenia-platlelets up to 124,000. 6.DM-pre op HGA1C 6.8 7. Supplement potassium 8. Will need HH RN and PT. Possibly discharge in am  Marsean Elkhatib MPA-C 10/12/2015,8:28 AM

## 2015-10-12 NOTE — Progress Notes (Signed)
Physical Therapy Treatment Patient Details Name: [name] MRN: [id number] DOB: 06-May-1929 Today's Date: 10/12/2015    History of Present Illness Patient is a 82 y/o male with hx of HTN, HLD, gout, CHF, CAD, DM and chronic back pain presents s/p minimally invasive MVR.    PT Comments    Patient requires max coaxing and encouragement to participate with PT this am. Continues to require cues to adhere to sternal precautions during transfers and mobility and despite cues, pt continues to use UEs. Fatigues during ambulation requiring seated rest break however not sure if this is necessary or just from habit. No dyspnea noted. Encouraged ambulation 2 more times today. Will follow.    Follow Up Recommendations  Home health PT;Supervision for mobility/OOB     Equipment Recommendations  None recommended by PT    Recommendations for Other Services       Precautions / Restrictions Precautions Precautions: Fall;Sternal Restrictions Weight Bearing Restrictions: No    Mobility  Bed Mobility Overal bed mobility: Needs Assistance Bed Mobility: Rolling;Sidelying to Sit Rolling: Min guard       Sit to sidelying: Min guard;HOB elevated General bed mobility comments: Cues for log roll technique. Despite cues not to use UEs, pt pulling up on rail to get to EOB.   Transfers Overall transfer level: Needs assistance Equipment used: Rolling walker (2 wheeled) Transfers: Sit to/from Stand Sit to Stand: Min assist;From elevated surface         General transfer comment: Min A to boost from elevated bed height with cues to adhere to sternal precautions but despite cues, pt using UEs to stand. Stood from [employer], from rollator seat x1. Transferred to chair post ambulation bout.  Ambulation/Gait Ambulation/Gait assistance: Min guard Ambulation Distance (Feet): 120 Feet (+ 40') Assistive device: Rolling walker (2 wheeled) Gait Pattern/deviations: Step-through pattern;Decreased stride  length;Trunk flexed Gait velocity: decreased Gait velocity interpretation: <1.8 ft/sec, indicative of risk for recurrent falls General Gait Details: Cues for upright posture. Required seated rest break due to fatigue.    Stairs            Wheelchair Mobility    Modified Rankin (Stroke Patients Only)       Balance Overall balance assessment: Needs assistance Sitting-balance support: Feet supported;No upper extremity supported Sitting balance-Leahy Scale: Fair     Standing balance support: During functional activity Standing balance-Leahy Scale: Fair                      Cognition Arousal/Alertness: Awake/alert Behavior During Therapy: WFL for tasks assessed/performed Overall Cognitive Status: Within Functional Limits for tasks assessed                      Exercises      General Comments        Pertinent Vitals/Pain Pain Assessment: No/denies pain    Home Living                      Prior Function            PT Goals (current goals can now be found in the care plan section) Progress towards PT goals: Progressing toward goals    Frequency  Min 3X/week    PT Plan Current plan remains appropriate    Co-evaluation             End of Session Equipment Utilized During Treatment: Gait belt Activity Tolerance: Patient tolerated treatment well Patient left:  in chair;with call bell/phone within reach     Time: 0908-0938 PT Time Calculation (min) (ACUTE ONLY): 30 min  Charges:  $Gait Training: 8-22 mins $Therapeutic Activity: 8-22 mins                    G Codes:      [place] A [place] 10/12/2015, 10:05 AM Wray Kearns, [place], DPT [phone]

## 2015-10-12 NOTE — Progress Notes (Signed)
CARDIAC REHAB PHASE I   PRE:  Rate/Rhythm: 70 afib    BP: sitting 120/69    SaO2: 95 RA  MODE:  Ambulation: 150 ft   POST:  Rate/Rhythm: 79 afib    BP: sitting 104/64     SaO2: 97 RA  Pt asleep upon arrival but agreed to ambulate. Able to stand with min assist, used rollator with assist x2 for safety. Pt requested to sit x1, unsafe procedure for sitting on rollator. Encouraged pt to remember to lock brakes prior to sitting. No major c/o walking. Heated up his lunch but he stated he became nauseated when he smelled it. Not eating. Feet elevated in recliner. Will f/u tomorrow for ed. [place], ACSM 10/12/2015 2:32 PM

## 2015-10-12 NOTE — Care Management Note (Addendum)
Case Management Note  Patient Details  Name: [name] MRN: [id number] Date of Birth: 12/07/1928  Subjective/Objective:  82 y.o. M with Hypertensive Left Ventricular hypertrophy with Heart Failure and Physical Deconditioning. S/P Minimally Invasive MVR.                   Action/Plan: Anticipate discharge  Home with HHPT/RN today.CM notified [facility] rep, Manuela Schwartz via text.  No further CM needs but will be available should additional discharge needs arise.   Expected Discharge Date:                  Expected Discharge Plan:  [place]  In-House Referral:     Discharge planning Services  CM Consult  Post Acute Care Choice:    Choice offered to:  Patient  DME Arranged:    DME Agency:     HH Arranged:  RN, PT [facility] Agency:  [facility]  Status of Service:  Completed, signed off  Medicare Important Message Given:  Yes Date Medicare IM Given:    Medicare IM give by:    Date Additional Medicare IM Given:    Additional Medicare Important Message give by:     If discussed at [place] of Stay Meetings, dates discussed:10/12/2015   Additional Comments:  Delrae Sawyers, RN 10/12/2015, 2:16 PM

## 2015-10-12 NOTE — Progress Notes (Signed)
Echocardiogram 2D Echocardiogram has been performed.  [name] 10/12/2015, 12:43 PM

## 2015-10-13 LAB — PROTIME-INR
INR: 3.66 — ABNORMAL HIGH (ref 0.00–1.49)
Prothrombin Time: 35.6 seconds — ABNORMAL HIGH (ref 11.6–15.2)

## 2015-10-13 MED ORDER — WARFARIN SODIUM 2 MG PO TABS: 2.0000 mg | ORAL_TABLET | Freq: Every day | ORAL | Status: DC

## 2015-10-13 MED ORDER — TRAMADOL HCL 50 MG PO TABS: 50.0000 mg | ORAL_TABLET | Freq: Four times a day (QID) | ORAL | Status: DC | PRN

## 2015-10-13 MED ORDER — GLUCERNA SHAKE PO LIQD: 237.0000 mL | Freq: Three times a day (TID) | ORAL | Status: DC

## 2015-10-13 MED ORDER — FUROSEMIDE 40 MG PO TABS: 40.0000 mg | ORAL_TABLET | Freq: Every day | ORAL | Status: DC

## 2015-10-13 MED ORDER — METOPROLOL TARTRATE 25 MG PO TABS: 25.0000 mg | ORAL_TABLET | Freq: Two times a day (BID) | ORAL | Status: DC

## 2015-10-13 MED ORDER — METOPROLOL TARTRATE 25 MG PO TABS
25.0000 mg | ORAL_TABLET | Freq: Two times a day (BID) | ORAL | Status: DC
  Administered 2015-10-13: 25 mg via ORAL
  Filled 2015-10-13: qty 1

## 2015-10-13 MED ORDER — AMIODARONE HCL 200 MG PO TABS: 200.0000 mg | ORAL_TABLET | Freq: Two times a day (BID) | ORAL | Status: DC

## 2015-10-13 MED ORDER — POTASSIUM CHLORIDE CRYS ER 20 MEQ PO TBCR: 20.0000 meq | EXTENDED_RELEASE_TABLET | Freq: Every day | ORAL | Status: DC

## 2015-10-13 MED ORDER — ACETAMINOPHEN 500 MG PO TABS: 1000.0000 mg | ORAL_TABLET | Freq: Four times a day (QID) | ORAL | Status: DC | PRN

## 2015-10-13 NOTE — Progress Notes (Addendum)
      [address]       [place] [address]             [phone]        8 Days Post-Op Procedure(s) (LRB): Sternal Incision (N/A) INTRAOPERATIVE TRANSESOPHAGEAL ECHOCARDIOGRAM  Subjective: Patient is adamant about going home today.  Objective: Vital signs in last 24 hours: Temp:  [97.9 F (36.6 C)-98.3 F (36.8 C)] 98.3 F (36.8 C) (03/10 0518) Pulse Rate:  [75-81] 80 (03/10 0518) Cardiac Rhythm:  [-] Atrial fibrillation (03/09 1900) Resp:  [16-18] 17 (03/10 0518) BP: (105-132)/(62-72) 105/62 mmHg (03/10 0518) SpO2:  [90 %-98 %] 90 % (03/10 0518)  Pre op weight 100 kg Current Weight  10/12/15 227 lb 6.4 oz (103.148 kg)      Intake/Output from previous day: 03/09 0701 - 03/10 0700 In: 840 [P.O.:840] Out: -    Physical Exam:  Cardiovascular: IRRR IRRR, murmur Pulmonary: Diminished at bases bilaterally; no rales, wheezes, or rhonchi. Abdomen: Soft, non tender, bowel sounds present. Extremities: ++ bilateral lower extremity edema, chronic venous stasis changes Wound: Clean and dry.  No erythema or signs of infection.  Lab Results: CBC: No results for input(s): WBC, HGB, HCT, PLT in the last 72 hours. BMET:   Recent Labs  10/12/15 0309  NA 138  K 3.8  CL 100*  CO2 24  GLUCOSE 138*  BUN 13  CREATININE 0.92  CALCIUM 8.9    PT/INR:  Lab Results  Component Value Date   INR 3.66* 10/13/2015   INR 2.38* 10/12/2015   INR 1.90* 10/11/2015   ABG:  INR: Will add last result for INR, ABG once components are confirmed Will add last 4 CBG results once components are confirmed  Assessment/Plan:  1. CV - SAM/LVOT. Paced,A fib. On Amiodarone 200 mg bid, Lopressor 12.5 mg bid, and Norvasc 5 mg daily. On Coumadin as well. INR INCREASED to 3.66. Hold Coumadin for now and will discuss dosage with Dr. Roxy Manns.Tolerating BB. Will discuss with Dr. Roxy Manns if should stop Norvasc for now and just continue BB. Echo done yesterday showed LVEF 60-65%, mild  MS and trivial MR. 2.  Pulmonary - On room air.  Encourage incentive spirometer.  3. Volume Overload - On Lasix 40 mg daily 4.  Acute blood loss anemia - H and H stable at 10.3 and 32.1 5. Mild thrombocytopenia-platlelets up to 124,000. 6.DM-pre op HGA1C 6.8   [name],DONIELLE MPA-C 10/13/2015,8:00 AM   I have seen and examined the patient and agree with the assessment and plan as outlined.  Follow up ECHO looks good w/ no evidence for any residual SAM or LVOT obstruction.  Mitral repair looks good w/ trivial residual MR.  Will increase metoprolol to 25 mg bid and stop Norvasc.  Continue low dose lasix.  Hold warfarin today and restart 2 mg/day tomorrow.  Okay for d/c home if patient will be seen by home health nursing and PT over the weekend.  Reckeck INR on Monday.  Rexene Alberts, MD 10/13/2015 9:33 AM

## 2015-10-13 NOTE — Progress Notes (Signed)
I confirmed with [facility] that a nurse will be going out either Saturday or Sunday to see [name]. He was specifically instructed to take NO Coumadin tonight. He is to take Coumadin 2 mg starting the evening of 10/14/2015 and take daily or as directed. He is to weigh himself daily. If his weight continues to increase or gets worsening shortness of breath, he is to contact the office immediately.

## 2015-10-13 NOTE — Care Management Note (Signed)
Case Management Note  Patient Details  Name: [name] MRN: [id number] Date of Birth: 12/01/1928  Subjective/Objective:  Hypertensive left ventricular hypertrophy with heart failure                   Action/Plan: Discharge Planning: AVS reviewed NCM spoke to pt and dtr, Missy at bedside. Pt lives at home with wife. Has RW and 3n1 at home. States he can afford his medications. [facility] aware of dc home today with HHRN/PT. Scheduled soc on 10/15/2015.   PCP Dr [name]  Expected Discharge Date:  10/13/2015              Expected Discharge Plan:  [place]  In-House Referral:  NA  Discharge planning Services  CM Consult  Post Acute Care Choice:  Home Health Choice offered to:  Patient  DME Arranged:  N/A DME Agency:  NA  HH Arranged:  RN, PT [place] Agency:  [place]  Status of Service:  Completed, signed off  Medicare Important Message Given:  Yes Date Medicare IM Given:    Medicare IM give by:    Date Additional Medicare IM Given:    Additional Medicare Important Message give by:     If discussed at [place] of Stay Meetings, dates discussed:    Additional Comments:  Erenest Rasher, RN 10/13/2015, 1:05 PM

## 2015-10-13 NOTE — Progress Notes (Signed)
Assisted pt out of bathroom. Able to stand independently but asking for help with paper towels at sink. Encouraged pt to get his own since he is going home. Wife present. She is now worried that she can't take care of him, esp when her brother is in the hospital. Made her aware that she cannot leave [name] by himself. Ed completed, reinforcing to [name] that he must walk x3 qd, sit up, and practice IS. He is only inspiring 700 mL. Apparently phlegm has been a problem with swallowing and he has not been eating. Pt nodded understanding however wife sts he will not listen. Set up d/c video. Gave ex gl and will refer to [name].  [place], ACSM 2:30 PM 10/13/2015

## 2015-10-13 NOTE — Care Management Important Message (Signed)
Important Message  Patient Details  Name: [name] MRN: [id number] Date of Birth: 1928-10-23   Medicare Important Message Given:  Yes    [name] 10/13/2015, 11:47 AM

## 2015-10-15 DIAGNOSIS — Z7982 Long term (current) use of aspirin: Secondary | ICD-10-CM | POA: Diagnosis not present

## 2015-10-15 DIAGNOSIS — N183 Chronic kidney disease, stage 3 (moderate): Secondary | ICD-10-CM | POA: Diagnosis not present

## 2015-10-15 DIAGNOSIS — I5032 Chronic diastolic (congestive) heart failure: Secondary | ICD-10-CM | POA: Diagnosis not present

## 2015-10-15 DIAGNOSIS — Z952 Presence of prosthetic heart valve: Secondary | ICD-10-CM | POA: Diagnosis not present

## 2015-10-15 DIAGNOSIS — I130 Hypertensive heart and chronic kidney disease with heart failure and stage 1 through stage 4 chronic kidney disease, or unspecified chronic kidney disease: Secondary | ICD-10-CM | POA: Diagnosis not present

## 2015-10-15 DIAGNOSIS — Z48812 Encounter for surgical aftercare following surgery on the circulatory system: Secondary | ICD-10-CM | POA: Diagnosis not present

## 2015-10-15 DIAGNOSIS — Z7901 Long term (current) use of anticoagulants: Secondary | ICD-10-CM | POA: Diagnosis not present

## 2015-10-15 DIAGNOSIS — E1122 Type 2 diabetes mellitus with diabetic chronic kidney disease: Secondary | ICD-10-CM | POA: Diagnosis not present

## 2015-10-15 DIAGNOSIS — E785 Hyperlipidemia, unspecified: Secondary | ICD-10-CM | POA: Diagnosis not present

## 2015-10-15 DIAGNOSIS — I2510 Atherosclerotic heart disease of native coronary artery without angina pectoris: Secondary | ICD-10-CM | POA: Diagnosis not present

## 2015-10-15 DIAGNOSIS — Z9181 History of falling: Secondary | ICD-10-CM | POA: Diagnosis not present

## 2015-10-16 ENCOUNTER — Other Ambulatory Visit: Payer: Self-pay | Admitting: *Deleted

## 2015-10-16 ENCOUNTER — Encounter: Payer: Self-pay | Admitting: *Deleted

## 2015-10-16 DIAGNOSIS — I130 Hypertensive heart and chronic kidney disease with heart failure and stage 1 through stage 4 chronic kidney disease, or unspecified chronic kidney disease: Secondary | ICD-10-CM | POA: Diagnosis not present

## 2015-10-16 DIAGNOSIS — E1122 Type 2 diabetes mellitus with diabetic chronic kidney disease: Secondary | ICD-10-CM | POA: Diagnosis not present

## 2015-10-16 DIAGNOSIS — E785 Hyperlipidemia, unspecified: Secondary | ICD-10-CM | POA: Diagnosis not present

## 2015-10-16 DIAGNOSIS — Z9181 History of falling: Secondary | ICD-10-CM | POA: Diagnosis not present

## 2015-10-16 DIAGNOSIS — N183 Chronic kidney disease, stage 3 (moderate): Secondary | ICD-10-CM | POA: Diagnosis not present

## 2015-10-16 DIAGNOSIS — Z48812 Encounter for surgical aftercare following surgery on the circulatory system: Secondary | ICD-10-CM | POA: Diagnosis not present

## 2015-10-16 DIAGNOSIS — I5032 Chronic diastolic (congestive) heart failure: Secondary | ICD-10-CM | POA: Diagnosis not present

## 2015-10-16 DIAGNOSIS — Z7901 Long term (current) use of anticoagulants: Secondary | ICD-10-CM | POA: Diagnosis not present

## 2015-10-16 DIAGNOSIS — I2510 Atherosclerotic heart disease of native coronary artery without angina pectoris: Secondary | ICD-10-CM | POA: Diagnosis not present

## 2015-10-16 DIAGNOSIS — Z952 Presence of prosthetic heart valve: Secondary | ICD-10-CM | POA: Diagnosis not present

## 2015-10-16 DIAGNOSIS — Z7982 Long term (current) use of aspirin: Secondary | ICD-10-CM | POA: Diagnosis not present

## 2015-10-16 LAB — PROTIME-INR: INR: 7.6 — AB (ref 0.9–1.1)

## 2015-10-16 NOTE — Patient Outreach (Signed)
Transition of care call (week 1, discharged 3/10).  Spoke with spouse (on [facility] consent form), HIPPA verified on pt.   Spouse reports pt has been doing fair since discharge. Spouse reports HH PT has been working with him, [facility] RN went over the discharge medications/filled his pill planner for a week.  Spouse reports pt has no pain, weighs daily, knows to call MD for weight gain.   Pt relayed to spouse no sob.  Discussed with spouse Transition of care program, plan to follow  pt with weekly calls/schedule a home visit (31 days post day of discharge).   Plan to f/u with pt again telephonically 3/21.   Plan to inform Dr. Dennard Schaumann of [facility] involvement - send letter by in basket in Epic.    Zara Chess.   [place]  [phone]

## 2015-10-17 ENCOUNTER — Encounter (HOSPITAL_COMMUNITY): Payer: Self-pay | Admitting: Emergency Medicine

## 2015-10-17 ENCOUNTER — Telehealth: Payer: Self-pay | Admitting: Physician Assistant

## 2015-10-17 ENCOUNTER — Inpatient Hospital Stay (HOSPITAL_COMMUNITY)
Admission: EM | Admit: 2015-10-17 | Discharge: 2015-10-31 | DRG: 291 | Disposition: A | Discharge status: Skilled Nursing Facility | Payer: Medicare Other | Attending: Cardiology | Admitting: Cardiology

## 2015-10-17 ENCOUNTER — Emergency Department (HOSPITAL_COMMUNITY): Payer: Medicare Other

## 2015-10-17 ENCOUNTER — Ambulatory Visit (INDEPENDENT_AMBULATORY_CARE_PROVIDER_SITE_OTHER): Payer: Self-pay | Admitting: Pharmacist Clinician (PhC)/ Clinical Pharmacy Specialist

## 2015-10-17 DIAGNOSIS — N179 Acute kidney failure, unspecified: Secondary | ICD-10-CM | POA: Diagnosis present

## 2015-10-17 DIAGNOSIS — D6489 Other specified anemias: Secondary | ICD-10-CM | POA: Diagnosis present

## 2015-10-17 DIAGNOSIS — M25571 Pain in right ankle and joints of right foot: Secondary | ICD-10-CM | POA: Diagnosis present

## 2015-10-17 DIAGNOSIS — Z7901 Long term (current) use of anticoagulants: Secondary | ICD-10-CM

## 2015-10-17 DIAGNOSIS — I2510 Atherosclerotic heart disease of native coronary artery without angina pectoris: Secondary | ICD-10-CM | POA: Diagnosis present

## 2015-10-17 DIAGNOSIS — K761 Chronic passive congestion of liver: Secondary | ICD-10-CM | POA: Diagnosis present

## 2015-10-17 DIAGNOSIS — I110 Hypertensive heart disease with heart failure: Principal | ICD-10-CM | POA: Diagnosis present

## 2015-10-17 DIAGNOSIS — N178 Other acute kidney failure: Secondary | ICD-10-CM | POA: Diagnosis not present

## 2015-10-17 DIAGNOSIS — R296 Repeated falls: Secondary | ICD-10-CM

## 2015-10-17 DIAGNOSIS — N390 Urinary tract infection, site not specified: Secondary | ICD-10-CM | POA: Diagnosis present

## 2015-10-17 DIAGNOSIS — M109 Gout, unspecified: Secondary | ICD-10-CM | POA: Diagnosis present

## 2015-10-17 DIAGNOSIS — Z87891 Personal history of nicotine dependence: Secondary | ICD-10-CM

## 2015-10-17 DIAGNOSIS — Z8546 Personal history of malignant neoplasm of prostate: Secondary | ICD-10-CM

## 2015-10-17 DIAGNOSIS — N401 Enlarged prostate with lower urinary tract symptoms: Secondary | ICD-10-CM | POA: Diagnosis not present

## 2015-10-17 DIAGNOSIS — E869 Volume depletion, unspecified: Secondary | ICD-10-CM | POA: Diagnosis not present

## 2015-10-17 DIAGNOSIS — R0602 Shortness of breath: Secondary | ICD-10-CM | POA: Diagnosis not present

## 2015-10-17 DIAGNOSIS — Z9889 Other specified postprocedural states: Secondary | ICD-10-CM

## 2015-10-17 DIAGNOSIS — I5033 Acute on chronic diastolic (congestive) heart failure: Secondary | ICD-10-CM | POA: Diagnosis not present

## 2015-10-17 DIAGNOSIS — D72829 Elevated white blood cell count, unspecified: Secondary | ICD-10-CM | POA: Diagnosis not present

## 2015-10-17 DIAGNOSIS — I129 Hypertensive chronic kidney disease with stage 1 through stage 4 chronic kidney disease, or unspecified chronic kidney disease: Secondary | ICD-10-CM | POA: Diagnosis not present

## 2015-10-17 DIAGNOSIS — E876 Hypokalemia: Secondary | ICD-10-CM | POA: Diagnosis not present

## 2015-10-17 DIAGNOSIS — I5031 Acute diastolic (congestive) heart failure: Secondary | ICD-10-CM | POA: Diagnosis not present

## 2015-10-17 DIAGNOSIS — Z466 Encounter for fitting and adjustment of urinary device: Secondary | ICD-10-CM | POA: Diagnosis not present

## 2015-10-17 DIAGNOSIS — L89312 Pressure ulcer of right buttock, stage 2: Secondary | ICD-10-CM | POA: Diagnosis not present

## 2015-10-17 DIAGNOSIS — L8990 Pressure ulcer of unspecified site, unspecified stage: Secondary | ICD-10-CM | POA: Diagnosis present

## 2015-10-17 DIAGNOSIS — R319 Hematuria, unspecified: Secondary | ICD-10-CM | POA: Diagnosis not present

## 2015-10-17 DIAGNOSIS — K8020 Calculus of gallbladder without cholecystitis without obstruction: Secondary | ICD-10-CM | POA: Diagnosis not present

## 2015-10-17 DIAGNOSIS — R4182 Altered mental status, unspecified: Secondary | ICD-10-CM | POA: Diagnosis not present

## 2015-10-17 DIAGNOSIS — J189 Pneumonia, unspecified organism: Secondary | ICD-10-CM | POA: Diagnosis present

## 2015-10-17 DIAGNOSIS — R7989 Other specified abnormal findings of blood chemistry: Secondary | ICD-10-CM | POA: Diagnosis present

## 2015-10-17 DIAGNOSIS — I252 Old myocardial infarction: Secondary | ICD-10-CM | POA: Diagnosis not present

## 2015-10-17 DIAGNOSIS — D6832 Hemorrhagic disorder due to extrinsic circulating anticoagulants: Secondary | ICD-10-CM | POA: Diagnosis not present

## 2015-10-17 DIAGNOSIS — Z79899 Other long term (current) drug therapy: Secondary | ICD-10-CM | POA: Diagnosis not present

## 2015-10-17 DIAGNOSIS — Z8249 Family history of ischemic heart disease and other diseases of the circulatory system: Secondary | ICD-10-CM | POA: Diagnosis not present

## 2015-10-17 DIAGNOSIS — R1310 Dysphagia, unspecified: Secondary | ICD-10-CM | POA: Diagnosis present

## 2015-10-17 DIAGNOSIS — I480 Paroxysmal atrial fibrillation: Secondary | ICD-10-CM | POA: Diagnosis not present

## 2015-10-17 DIAGNOSIS — E785 Hyperlipidemia, unspecified: Secondary | ICD-10-CM | POA: Diagnosis present

## 2015-10-17 DIAGNOSIS — Y95 Nosocomial condition: Secondary | ICD-10-CM | POA: Diagnosis present

## 2015-10-17 DIAGNOSIS — E8881 Metabolic syndrome: Secondary | ICD-10-CM

## 2015-10-17 DIAGNOSIS — T8130XA Disruption of wound, unspecified, initial encounter: Secondary | ICD-10-CM | POA: Diagnosis not present

## 2015-10-17 DIAGNOSIS — Z955 Presence of coronary angioplasty implant and graft: Secondary | ICD-10-CM | POA: Diagnosis not present

## 2015-10-17 DIAGNOSIS — I340 Nonrheumatic mitral (valve) insufficiency: Secondary | ICD-10-CM | POA: Diagnosis not present

## 2015-10-17 DIAGNOSIS — R945 Abnormal results of liver function studies: Secondary | ICD-10-CM

## 2015-10-17 DIAGNOSIS — J90 Pleural effusion, not elsewhere classified: Secondary | ICD-10-CM | POA: Diagnosis not present

## 2015-10-17 DIAGNOSIS — R1313 Dysphagia, pharyngeal phase: Secondary | ICD-10-CM | POA: Diagnosis present

## 2015-10-17 DIAGNOSIS — M6281 Muscle weakness (generalized): Secondary | ICD-10-CM | POA: Diagnosis not present

## 2015-10-17 DIAGNOSIS — Z7982 Long term (current) use of aspirin: Secondary | ICD-10-CM | POA: Diagnosis not present

## 2015-10-17 DIAGNOSIS — Z741 Need for assistance with personal care: Secondary | ICD-10-CM | POA: Diagnosis not present

## 2015-10-17 DIAGNOSIS — I6203 Nontraumatic chronic subdural hemorrhage: Secondary | ICD-10-CM | POA: Diagnosis present

## 2015-10-17 DIAGNOSIS — R1312 Dysphagia, oropharyngeal phase: Secondary | ICD-10-CM | POA: Diagnosis not present

## 2015-10-17 DIAGNOSIS — I959 Hypotension, unspecified: Secondary | ICD-10-CM | POA: Diagnosis not present

## 2015-10-17 DIAGNOSIS — I5023 Acute on chronic systolic (congestive) heart failure: Secondary | ICD-10-CM

## 2015-10-17 DIAGNOSIS — E8809 Other disorders of plasma-protein metabolism, not elsewhere classified: Secondary | ICD-10-CM | POA: Diagnosis present

## 2015-10-17 DIAGNOSIS — R4587 Impulsiveness: Secondary | ICD-10-CM | POA: Diagnosis present

## 2015-10-17 DIAGNOSIS — E784 Other hyperlipidemia: Secondary | ICD-10-CM | POA: Diagnosis not present

## 2015-10-17 DIAGNOSIS — I481 Persistent atrial fibrillation: Secondary | ICD-10-CM | POA: Diagnosis not present

## 2015-10-17 DIAGNOSIS — R41841 Cognitive communication deficit: Secondary | ICD-10-CM | POA: Diagnosis not present

## 2015-10-17 DIAGNOSIS — I4819 Other persistent atrial fibrillation: Secondary | ICD-10-CM

## 2015-10-17 DIAGNOSIS — I4891 Unspecified atrial fibrillation: Secondary | ICD-10-CM | POA: Diagnosis not present

## 2015-10-17 DIAGNOSIS — I10 Essential (primary) hypertension: Secondary | ICD-10-CM | POA: Diagnosis not present

## 2015-10-17 DIAGNOSIS — I509 Heart failure, unspecified: Secondary | ICD-10-CM | POA: Diagnosis not present

## 2015-10-17 DIAGNOSIS — T45515A Adverse effect of anticoagulants, initial encounter: Secondary | ICD-10-CM | POA: Diagnosis present

## 2015-10-17 DIAGNOSIS — I272 Other secondary pulmonary hypertension: Secondary | ICD-10-CM | POA: Diagnosis not present

## 2015-10-17 DIAGNOSIS — R2681 Unsteadiness on feet: Secondary | ICD-10-CM | POA: Diagnosis not present

## 2015-10-17 DIAGNOSIS — I482 Chronic atrial fibrillation: Secondary | ICD-10-CM | POA: Diagnosis not present

## 2015-10-17 DIAGNOSIS — M1A30X0 Chronic gout due to renal impairment, unspecified site, without tophus (tophi): Secondary | ICD-10-CM | POA: Diagnosis not present

## 2015-10-17 DIAGNOSIS — N183 Chronic kidney disease, stage 3 (moderate): Secondary | ICD-10-CM | POA: Diagnosis not present

## 2015-10-17 DIAGNOSIS — J9811 Atelectasis: Secondary | ICD-10-CM

## 2015-10-17 DIAGNOSIS — E119 Type 2 diabetes mellitus without complications: Secondary | ICD-10-CM | POA: Diagnosis not present

## 2015-10-17 DIAGNOSIS — I6200 Nontraumatic subdural hemorrhage, unspecified: Secondary | ICD-10-CM | POA: Diagnosis not present

## 2015-10-17 DIAGNOSIS — I5030 Unspecified diastolic (congestive) heart failure: Secondary | ICD-10-CM | POA: Diagnosis not present

## 2015-10-17 DIAGNOSIS — I5032 Chronic diastolic (congestive) heart failure: Secondary | ICD-10-CM

## 2015-10-17 DIAGNOSIS — D649 Anemia, unspecified: Secondary | ICD-10-CM | POA: Diagnosis not present

## 2015-10-17 LAB — CBC WITH DIFFERENTIAL/PLATELET
Basophils Absolute: 0 10*3/uL (ref 0.0–0.1)
Basophils Relative: 0 %
EOS ABS: 0 10*3/uL (ref 0.0–0.7)
Eosinophils Relative: 0 %
HEMATOCRIT: 29.4 % — AB (ref 39.0–52.0)
HEMOGLOBIN: 9.5 g/dL — AB (ref 13.0–17.0)
LYMPHS ABS: 1.4 10*3/uL (ref 0.7–4.0)
LYMPHS PCT: 8 %
MCH: 29.9 pg (ref 26.0–34.0)
MCHC: 32.3 g/dL (ref 30.0–36.0)
MCV: 92.5 fL (ref 78.0–100.0)
MONOS PCT: 10 %
Monocytes Absolute: 1.7 10*3/uL — ABNORMAL HIGH (ref 0.1–1.0)
NEUTROS ABS: 13.7 10*3/uL — AB (ref 1.7–7.7)
NEUTROS PCT: 82 %
PLATELETS: 212 10*3/uL (ref 150–400)
RBC: 3.18 MIL/uL — AB (ref 4.22–5.81)
RDW: 15 % (ref 11.5–15.5)
WBC: 16.8 10*3/uL — AB (ref 4.0–10.5)

## 2015-10-17 LAB — BASIC METABOLIC PANEL
ANION GAP: 10 (ref 5–15)
BUN: 44 mg/dL — ABNORMAL HIGH (ref 6–20)
CHLORIDE: 101 mmol/L (ref 101–111)
CO2: 27 mmol/L (ref 22–32)
CREATININE: 1.33 mg/dL — AB (ref 0.61–1.24)
Calcium: 8.6 mg/dL — ABNORMAL LOW (ref 8.9–10.3)
GFR calc non Af Amer: 47 mL/min — ABNORMAL LOW (ref >60)
GFR, EST AFRICAN AMERICAN: 55 mL/min — AB (ref >60)
Glucose, Bld: 141 mg/dL — ABNORMAL HIGH (ref 65–99)
POTASSIUM: 4.7 mmol/L (ref 3.5–5.1)
SODIUM: 138 mmol/L (ref 135–145)

## 2015-10-17 LAB — BRAIN NATRIURETIC PEPTIDE: B NATRIURETIC PEPTIDE 5: 763.6 pg/mL — AB (ref 0.0–100.0)

## 2015-10-17 LAB — I-STAT TROPONIN, ED: TROPONIN I, POC: 0.44 ng/mL — AB (ref 0.00–0.08)

## 2015-10-17 LAB — HEPATIC FUNCTION PANEL
ALK PHOS: 621 U/L — AB (ref 38–126)
ALT: 77 U/L — AB (ref 17–63)
AST: 85 U/L — ABNORMAL HIGH (ref 15–41)
Albumin: 2 g/dL — ABNORMAL LOW (ref 3.5–5.0)
BILIRUBIN DIRECT: 1.6 mg/dL — AB (ref 0.1–0.5)
BILIRUBIN INDIRECT: 1.6 mg/dL — AB (ref 0.3–0.9)
BILIRUBIN TOTAL: 3.2 mg/dL — AB (ref 0.3–1.2)
Total Protein: 5.5 g/dL — ABNORMAL LOW (ref 6.5–8.1)

## 2015-10-17 LAB — PROTIME-INR
INR: 6.13 (ref 0.00–1.49)
Prothrombin Time: 52.4 seconds — ABNORMAL HIGH (ref 11.6–15.2)

## 2015-10-17 MED ORDER — PRAVASTATIN SODIUM 40 MG PO TABS
40.0000 mg | ORAL_TABLET | Freq: Every day | ORAL | Status: DC
  Administered 2015-10-18 – 2015-10-30 (×13): 40 mg via ORAL
  Filled 2015-10-17 (×14): qty 1

## 2015-10-17 MED ORDER — TAMSULOSIN HCL 0.4 MG PO CAPS
0.4000 mg | ORAL_CAPSULE | Freq: Every day | ORAL | Status: DC
  Administered 2015-10-18 – 2015-10-31 (×14): 0.4 mg via ORAL
  Filled 2015-10-17 (×15): qty 1

## 2015-10-17 MED ORDER — SODIUM CHLORIDE 0.9% FLUSH
3.0000 mL | Freq: Two times a day (BID) | INTRAVENOUS | Status: DC
  Administered 2015-10-18 – 2015-10-31 (×23): 3 mL via INTRAVENOUS

## 2015-10-17 MED ORDER — SODIUM CHLORIDE 0.9% FLUSH
3.0000 mL | INTRAVENOUS | Status: DC | PRN
  Administered 2015-10-28: 3 mL via INTRAVENOUS
  Filled 2015-10-17: qty 3

## 2015-10-17 MED ORDER — AMIODARONE HCL 200 MG PO TABS
200.0000 mg | ORAL_TABLET | Freq: Every day | ORAL | Status: DC
  Administered 2015-10-18 – 2015-10-27 (×10): 200 mg via ORAL
  Filled 2015-10-17 (×10): qty 1

## 2015-10-17 MED ORDER — ONDANSETRON HCL 4 MG/2ML IJ SOLN
4.0000 mg | Freq: Four times a day (QID) | INTRAMUSCULAR | Status: DC | PRN
Start: 2015-10-17 — End: 2015-10-31

## 2015-10-17 MED ORDER — SODIUM CHLORIDE 0.9 % IV SOLN
250.0000 mL | INTRAVENOUS | Status: DC | PRN
  Administered 2015-10-20: 500 mL via INTRAVENOUS
  Administered 2015-10-20: 11:00:00 via INTRAVENOUS

## 2015-10-17 MED ORDER — ASPIRIN EC 81 MG PO TBEC
81.0000 mg | DELAYED_RELEASE_TABLET | Freq: Every day | ORAL | Status: DC
  Administered 2015-10-18 – 2015-10-31 (×13): 81 mg via ORAL
  Filled 2015-10-17 (×14): qty 1

## 2015-10-17 MED ORDER — FUROSEMIDE 10 MG/ML IJ SOLN
80.0000 mg | Freq: Once | INTRAMUSCULAR | Status: AC
  Administered 2015-10-17: 80 mg via INTRAVENOUS
  Filled 2015-10-17: qty 8

## 2015-10-17 MED ORDER — ACETAMINOPHEN 500 MG PO TABS
1000.0000 mg | ORAL_TABLET | Freq: Four times a day (QID) | ORAL | Status: DC | PRN
  Administered 2015-10-21 – 2015-10-29 (×6): 1000 mg via ORAL
  Filled 2015-10-17 (×6): qty 2

## 2015-10-17 MED ORDER — FINASTERIDE 5 MG PO TABS
5.0000 mg | ORAL_TABLET | Freq: Every day | ORAL | Status: DC
  Administered 2015-10-18 – 2015-10-31 (×14): 5 mg via ORAL
  Filled 2015-10-17 (×14): qty 1

## 2015-10-17 NOTE — ED Provider Notes (Signed)
CSN: [id number]     Arrival date & time 10/17/15  1821 History   First MD Initiated Contact with Patient 10/17/15 1849     Chief Complaint  Patient presents with  . Shortness of Breath  . Edema     (Consider location/radiation/quality/duration/timing/severity/associated sxs/prior Treatment) HPI Comments: Patient presents with shortness of breath.  He has a history of diabetes, coronary artery disease status post stent placement, pulmonary hypertension, hypertension and SVT. He was admitted recently for severe mitral regurgitation and had a minimally invasive mitral valve repair by Dr. Roxy Manns on March 1. He was discharged on March 10. He states that since she's been home he is been increasingly short of breath. He's had increasing lower extremity edema. He feels like he is holding onto fluid. He's been taking Lasix 60 mg twice a day but is not urinating as much as he thinks he should. His preop weight he states was 215 and his weight today was 223.  He denies any fevers. No nausea or vomiting. He is not oxygen requiring.  Patient is a 82 y.o. male presenting with shortness of breath.  Shortness of Breath Associated symptoms: chest pain (soreness from surgery)   Associated symptoms: no abdominal pain, no cough, no diaphoresis, no fever, no headaches, no rash and no vomiting     Past Medical History  Diagnosis Date  . Coronary artery disease     a. 11/2014 NSTEMI: DESx 2 placed to LAD and RCA   . Mitral valve prolapse   . Diabetes mellitus, type II ([place])     a. HgA1c 6.8 in 03/2015  . Mitral regurgitation     a. severe wtih flail leaflet by 2D ECHO (06/05/15)  . SVT (supraventricular tachycardia) ([facility])     a. recurrent, usually responsive to vagal manuevers  . HTN (hypertension)   . Pulmonary HTN ([place])   . Lower extremity edema     a. chronic  . HLD (hyperlipidemia)   . Hypertension   . OA (osteoarthritis)   . ED (erectile dysfunction)   . Chronic fatigue   . Gout   . Spinal stenosis    . Prostate cancer ([place])   . Severe mitral regurgitation 11/17/2014    Mitral valve prolapse with flail segment of anterior leaflet   . Chronic diastolic (congestive) heart failure ([place])   . Hemangioma of liver 08/02/2015    12 mm enhancing lesion seen on CT - suspicious for benign hemangioma but not diagnostic - MRI recommended  . Dysrhythmia   . Heart murmur   . Shortness of breath dyspnea   . Kidney stones   . Physical deconditioning   . S/P minimally invasive mitral valve repair 10/04/2015    Complex valvuloplasty including artificial Gore-tex neochord placement x6 with plication closure of cleft between P2 and P3 and 30 mm Sorin Memo 3D ring annuloplasty via right mini thoracotomy approach   . Hypertensive left ventricular hypertrophy with heart failure ([place]) 10/04/2015  . Hypertensive cardiomyopathy [facility])    Past Surgical History  Procedure Laterality Date  . Cardiac surgery      2 cardiac stents  . Joint replacement      shoulder  . Back surgery x 2    . Rt rotator cuff repair    . Cervical spine surgery    . Back surgery    . Left heart catheterization with coronary angiogram N/A 11/16/2014    Procedure: LEFT HEART CATHETERIZATION WITH CORONARY ANGIOGRAM;  Surgeon: Troy Sine, MD;  Location: [facility] CATH LAB;  Service: Cardiovascular;  Laterality: N/A;  . Tee without cardioversion N/A 06/22/2015    Procedure: TRANSESOPHAGEAL ECHOCARDIOGRAM (TEE);  Surgeon: Skeet Latch, MD;  Location: [facility];  Service: Cardiovascular;  Laterality: N/A;  . Cardiac catheterization N/A 08/08/2015    Procedure: Right/Left Heart Cath and Coronary Angiography;  Surgeon: Sherren Mocha, MD;  Location: [facility] CV LAB;  Service: Cardiovascular;  Laterality: N/A;  . Eye surgery    . Mitral valve repair Right 10/04/2015    Procedure: MINIMALLY INVASIVE MITRAL VALVE REPAIR (MVR);  Surgeon: Rexene Alberts, MD;  Location: [facility];  Service: Open Heart Surgery;  Laterality: Right;  . Tee without  cardioversion N/A 10/04/2015    Procedure: TRANSESOPHAGEAL ECHOCARDIOGRAM (TEE);  Surgeon: Rexene Alberts, MD;  Location: [facility];  Service: Open Heart Surgery;  Laterality: N/A;  . Wound exploration N/A 10/05/2015    Procedure: Sternal Incision;  Surgeon: Rexene Alberts, MD;  Location: [facility];  Service: Open Heart Surgery;  Laterality: N/A;  . Intraoperative transesophageal echocardiogram  10/05/2015    Procedure: INTRAOPERATIVE TRANSESOPHAGEAL ECHOCARDIOGRAM;  Surgeon: Rexene Alberts, MD;  Location: [facility] OR;  Service: Open Heart Surgery;;   Family History  Problem Relation Age of Onset  . Heart failure Mother 16  . Heart attack Mother   . Heart attack Brother    Social History  Substance Use Topics  . Smoking status: Former Research scientist (life sciences)  . Smokeless tobacco: Never Used     Comment: QUIT SMOKING  MANY YEARS AGO"  . Alcohol Use: No    Review of Systems  Constitutional: Positive for appetite change, fatigue and unexpected weight change. Negative for fever, chills and diaphoresis.  HENT: Negative for congestion, rhinorrhea and sneezing.   Eyes: Negative.   Respiratory: Positive for shortness of breath. Negative for cough and chest tightness.   Cardiovascular: Positive for chest pain (soreness from surgery) and leg swelling.  Gastrointestinal: Negative for nausea, vomiting, abdominal pain, diarrhea and blood in stool.  Genitourinary: Negative for frequency, hematuria, flank pain and difficulty urinating.  Musculoskeletal: Negative for back pain and arthralgias.  Skin: Negative for rash.  Neurological: Negative for dizziness, speech difficulty, weakness, numbness and headaches.      Allergies  Review of patient's allergies indicates no known allergies.  Home Medications   Prior to Admission medications   Medication Sig Start Date End Date Taking? Authorizing Provider  acetaminophen (TYLENOL) 500 MG tablet Take 2 tablets (1,000 mg total) by mouth every 6 (six) hours as needed for mild pain or  fever. 10/13/15  Yes Donielle Liston Alba, PA-C  amiodarone (PACERONE) 200 MG tablet Take 1 tablet (200 mg total) by mouth 2 (two) times daily with a meal. For 5 days then take Amiodarone 200 mg by mouth daily thereafter 10/13/15  Yes Donielle Liston Alba, PA-C  aspirin EC 81 MG tablet Take 81 mg by mouth daily.   Yes Historical Provider, MD  finasteride (PROSCAR) 5 MG tablet TAKE ONE (1) TABLET EACH DAY 09/28/15  Yes [name], MD  fluticasone [facility]) 50 MCG/ACT nasal spray Place 1 spray into both nostrils daily as needed for allergies or rhinitis.   Yes Historical Provider, MD  furosemide (LASIX) 40 MG tablet Take 1 tablet (40 mg total) by mouth daily. 10/13/15  Yes Donielle Liston Alba, PA-C  Menthol-Methyl Salicylate (MUSCLE RUB) 10-15 % CREA Apply 1 application topically 2 (two) times daily as needed for muscle pain.    Yes Historical Provider, MD  metoprolol tartrate (LOPRESSOR) 25 MG tablet Take 1 tablet (25 mg total) by mouth 2 (two) times daily. 10/13/15  Yes Donielle Liston Alba, PA-C  potassium chloride SA (K-DUR,KLOR-CON) 20 MEQ tablet Take 1 tablet (20 mEq total) by mouth daily. 10/13/15  Yes Donielle Liston Alba, PA-C  pravastatin (PRAVACHOL) 40 MG tablet Take 1 tablet (40 mg total) by mouth daily at 6 PM. Patient taking differently: Take 40 mg by mouth daily.  06/05/15  Yes Eileen Stanford, PA-C  tamsulosin (FLOMAX) 0.4 MG CAPS capsule Take 1 capsule (0.4 mg total) by mouth daily. 09/08/15  Yes [name], MD  traMADol (ULTRAM) 50 MG tablet Take 1 tablet (50 mg total) by mouth every 6 (six) hours as needed for moderate pain. 10/13/15  Yes Donielle Liston Alba, PA-C  warfarin (COUMADIN) 2 MG tablet Take 1 tablet (2 mg total) by mouth daily at 6 PM. Or as directed 10/14/15  Yes Donielle Liston Alba, PA-C  feeding supplement, GLUCERNA SHAKE, (GLUCERNA SHAKE) LIQD Take 237 mLs by mouth 3 (three) times daily between meals. Take until tolerating a diet better Patient not taking:  Reported on 10/17/2015 10/13/15   Donielle M Tacy Dura, PA-C   BP 118/76 mmHg  Pulse 80  Resp 24  SpO2 96% Physical Exam  Constitutional: He is oriented to person, place, and time. He appears well-developed and well-nourished.  HENT:  Head: Normocephalic and atraumatic.  Eyes: Pupils are equal, round, and reactive to light.  Neck: Normal range of motion. Neck supple.  Cardiovascular: Normal rate and regular rhythm.   Murmur heard. Pulmonary/Chest: Effort normal and breath sounds normal. No respiratory distress. He has no wheezes. He has no rales. He exhibits tenderness.  Midline incision that appears to be well-healing  Abdominal: Soft. Bowel sounds are normal. There is no tenderness. There is no rebound and no guarding.  Musculoskeletal: Normal range of motion. He exhibits edema (Large pitting edema bilaterally).  Lymphadenopathy:    He has no cervical adenopathy.  Neurological: He is alert and oriented to person, place, and time.  Skin: Skin is warm and dry. No rash noted.  Psychiatric: He has a normal mood and affect.    ED Course  Procedures (including critical care time) Labs Review Labs Reviewed  BASIC METABOLIC PANEL - Abnormal; Notable for the following:    Glucose, Bld 141 (*)    BUN 44 (*)    Creatinine, Ser 1.33 (*)    Calcium 8.6 (*)    GFR calc non Af Amer 47 (*)    GFR calc Af Amer 55 (*)    All other components within normal limits  CBC WITH DIFFERENTIAL/PLATELET - Abnormal; Notable for the following:    WBC 16.8 (*)    RBC 3.18 (*)    Hemoglobin 9.5 (*)    HCT 29.4 (*)    Neutro Abs 13.7 (*)    Monocytes Absolute 1.7 (*)    All other components within normal limits  BRAIN NATRIURETIC PEPTIDE - Abnormal; Notable for the following:    B Natriuretic Peptide 763.6 (*)    All other components within normal limits  PROTIME-INR - Abnormal; Notable for the following:    Prothrombin Time 52.4 (*)    INR 6.13 (*)    All other components within normal limits   I-STAT TROPOININ, ED - Abnormal; Notable for the following:    Troponin i, poc 0.44 (*)    All other components within normal limits  HEPATIC FUNCTION PANEL  Imaging Review Dg Chest Port 1 View  10/17/2015  CLINICAL DATA:  Shortness of breath, bilateral lower extremity edema for 2 weeks. Open heart surgery 2 weeks ago. History of hypertension, CHF, diabetes, coronary artery disease, dysrhythmia EXAM: PORTABLE CHEST 1 VIEW COMPARISON:  Chest x-rays dated 10/12/2015 and 10/09/2015. FINDINGS: Dense opacity at the right lung base is slightly more prominent on today's study, most likely a combination of atelectasis and small pleural effusion. Left lung remains clear. Cardiomegaly is stable. No pneumothorax seen. No acute osseous abnormality seen. Degenerative changes again noted about the right shoulder. IMPRESSION: Dense opacity at the right lung base appears slightly more prominent on today's study, most likely a combination of atelectasis and small pleural effusion, presumably with a slight interval increase compared to the previous exam. Stable cardiomegaly. Electronically Signed   By: Franki Cabot M.D.   On: 10/17/2015 19:18   I have personally reviewed and evaluated these images and lab results as part of my medical decision-making.   EKG Interpretation   Date/Time:  Tuesday October 17 2015 18:42:14 EDT Ventricular Rate:  75 PR Interval:    QRS Duration: 110 QT Interval:  446 QTC Calculation: 498 R Axis:   -64 Text Interpretation:  Atrial fibrillation Left anterior fascicular block  Inferior infarct , age undetermined Possible Anterior infarct , age  undetermined Abnormal ECG Confirmed by Cynthie Garmon  MD, Shadae Reino [id number]) on  10/17/2015 6:50:55 PM      MDM   Final diagnoses:  Acute on chronic systolic congestive heart failure ([place])    Patient presents with signs of fluid overload status post a recent mitral valve repair. He is not requiring oxygen. His chest x-ray is clear other than a  possible small pleural effusion. He does have an elevated WBC count but there is no evidence of pneumonia. He does have a significant amount of pedal edema. I consulted with Dr. Prescott Gum he recommends diuresing the patient and having him admitted to the cardiology service. I spoke with the cardiology fellow on call, Dr. Margaretha Seeds, who will admit the patient.    Malvin Johns, MD 10/17/15 2242

## 2015-10-17 NOTE — Telephone Encounter (Signed)
New message      Pt was discharged from [facility] on 10-13-15.  He had surgery by Dr Ricard Dillon.  Pts pre surgical wt was 215lbs.  Today it is 223.6 with sob and pitting edema in legs.  Daughter states pt is having trouble urinating.  Wife gave pt 80mg  of lasix. Please call pt.

## 2015-10-17 NOTE — Telephone Encounter (Signed)
Primary Cardiologist is Dr. Percival Spanish.  Will forward to NL triage to call pt.

## 2015-10-17 NOTE — H&P (Addendum)
HPI:  82 yo M with h/o mitral valve repair on 10/04/15 who presents with shortness of breath and lower extremity edema.  Patient is altered on exam and not able to answer questions consistently, so history was obtained from speaking to the wife on the phone [phone]) and chart review.   Patient recently underwent a minimally-invasive mitral valve repair on 3/1 and his post-op course was complicated by LVOT obstruction due to [facility], which resolved with adjustment of inotropic medications.  He developed atrial fibrillation and was put on amiodarone and Coumadin.  He was discharged on 3/10 to home.  Per wife, he has been very confused and not himself since coming home.  He has been talking all night, not making any sense.   He gets up constantly to urinate, but nothing comes out.  His personality is different, as well, he has been uncharacteristically rude to his wife, telling her she was the devil.  She notes that he can't swallow and easily chokes, even a small pill, since being home.  His wife has been giving him his meds in apple sauce.  He hasn't really ate anything of substance since coming home.  He has been taking his Coumadin since discharge.  His wife also notes that his LE edema has become significantly worse since discharge a few days ago.  On limited interview with patient, he reports that he feels ok, but is short of breath and his edema is bothersome.  He is unable to describe his symptoms further.   BNP 763 Troponin (POC) 0.44 INR 6.13 (up from 3.66 on 3/10 at discharge)  Bedside sonosite echo performed by myself was technically difficult, revealed EF >55%, possible RV dilation and low normal function.  No significant mitral regurgitation was appreciated. Left atrial enlargement.  Small pericardial effusion without evidence of tamponade.  Review of Systems: unable to 2/2 altered mental status   Past Medical History  Diagnosis Date  . Coronary artery disease     a. 11/2014  NSTEMI: DESx 2 placed to LAD and RCA   . Mitral valve prolapse   . Diabetes mellitus, type II ([place])     a. HgA1c 6.8 in 03/2015  . Mitral regurgitation     a. severe wtih flail leaflet by 2D ECHO (06/05/15)  . SVT (supraventricular tachycardia) (HCC)     a. recurrent, usually responsive to vagal manuevers  . HTN (hypertension)   . Pulmonary HTN ([place])   . Lower extremity edema     a. chronic  . HLD (hyperlipidemia)   . Hypertension   . OA (osteoarthritis)   . ED (erectile dysfunction)   . Chronic fatigue   . Gout   . Spinal stenosis   . Prostate cancer ([place])   . Severe mitral regurgitation 11/17/2014    Mitral valve prolapse with flail segment of anterior leaflet   . Chronic diastolic (congestive) heart failure ([place])   . Hemangioma of liver 08/02/2015    12 mm enhancing lesion seen on CT - suspicious for benign hemangioma but not diagnostic - MRI recommended  . Dysrhythmia   . Heart murmur   . Shortness of breath dyspnea   . Kidney stones   . Physical deconditioning   . S/P minimally invasive mitral valve repair 10/04/2015    Complex valvuloplasty including artificial Gore-tex neochord placement x6 with plication closure of cleft between P2 and P3 and 30 mm Sorin Memo 3D ring annuloplasty via right mini thoracotomy approach   . Hypertensive  left ventricular hypertrophy with heart failure ([place]) 10/04/2015  . Hypertensive cardiomyopathy (HCC)    Current Facility-Administered Medications  Medication Dose Route Frequency Provider Last Rate Last Dose  . furosemide (LASIX) injection 80 mg  80 mg Intravenous Once Malvin Johns, MD       Current Outpatient Prescriptions  Medication Sig Dispense Refill  . acetaminophen (TYLENOL) 500 MG tablet Take 2 tablets (1,000 mg total) by mouth every 6 (six) hours as needed for mild pain or fever. 30 tablet 0  . amiodarone (PACERONE) 200 MG tablet Take 1 tablet (200 mg total) by mouth 2 (two) times daily with a meal. For 5 days then take Amiodarone 200  mg by mouth daily thereafter 60 tablet 1  . aspirin EC 81 MG tablet Take 81 mg by mouth daily.    . finasteride (PROSCAR) 5 MG tablet TAKE ONE (1) TABLET EACH DAY 30 tablet 3  . fluticasone (FLONASE) 50 MCG/ACT nasal spray Place 1 spray into both nostrils daily as needed for allergies or rhinitis.    . furosemide (LASIX) 40 MG tablet Take 1 tablet (40 mg total) by mouth daily. 30 tablet 1  . Menthol-Methyl Salicylate (MUSCLE RUB) 10-15 % CREA Apply 1 application topically 2 (two) times daily as needed for muscle pain.     . metoprolol tartrate (LOPRESSOR) 25 MG tablet Take 1 tablet (25 mg total) by mouth 2 (two) times daily. 60 tablet 1  . potassium chloride SA (K-DUR,KLOR-CON) 20 MEQ tablet Take 1 tablet (20 mEq total) by mouth daily. 30 tablet 1  . pravastatin (PRAVACHOL) 40 MG tablet Take 1 tablet (40 mg total) by mouth daily at 6 PM. (Patient taking differently: Take 40 mg by mouth daily. ) 30 tablet 11  . tamsulosin (FLOMAX) 0.4 MG CAPS capsule Take 1 capsule (0.4 mg total) by mouth daily. 90 capsule 1  . traMADol (ULTRAM) 50 MG tablet Take 1 tablet (50 mg total) by mouth every 6 (six) hours as needed for moderate pain. 20 tablet 0  . warfarin (COUMADIN) 2 MG tablet Take 1 tablet (2 mg total) by mouth daily at 6 PM. Or as directed 30 tablet 1  . feeding supplement, GLUCERNA SHAKE, (GLUCERNA SHAKE) LIQD Take 237 mLs by mouth 3 (three) times daily between meals. Take until tolerating a diet better (Patient not taking: Reported on 10/17/2015)  0     No Known Allergies  Social History   Social History  . Marital Status: Married    Spouse Name: N/A  . Number of Children: 4  . Years of Education: N/A   Social History Main Topics  . Smoking status: Former Research scientist (life sciences)  . Smokeless tobacco: Never Used     Comment: QUIT SMOKING  MANY YEARS AGO"  . Alcohol Use: No  . Drug Use: No  . Sexual Activity: Not on file    Family History  Problem Relation Age of Onset  . Heart failure Mother 55  .  Heart attack Mother   . Heart attack Brother     PHYSICAL EXAM: Filed Vitals:   10/17/15 2145 10/17/15 2200  BP: 111/72 110/66  Pulse: 78 82  Resp: 15 17  97% on RA Wt Readings from Last 3 Encounters:  10/12/15 227 lb 6.4 oz (103.148 kg)  10/02/15 220 lb (99.791 kg)  10/02/15 220 lb 2 oz (99.848 kg)   General:  Ill-appearing. No respiratory difficulty HEENT: adentulous Chest: sternotomy well-healing, no erythema or drainage Neck: supple. JVD to the level of the  mandible with +HJR Cor: Distant heart sounds, possible friction rub, NRRR, no murmurs appreciated although difficult to assess Lungs: Bibasilar crackles (R>L) Abdomen: soft, nontender, distended with +hepatomegaly, surgical bandages in place with no purulent drainage Extremities: 4+ pitting edema of the LE to the groin and dependent abdominal wall edema Neuro: alert & oriented x1, tangential  ECG: 10/17/15 atrial fibrillation, left axis deviation, inferior q waves, poor r wave progression  Results for orders placed or performed during the hospital encounter of 10/17/15 (from the past 24 hour(s))  I-stat troponin, ED     Status: Abnormal   Collection Time: 10/17/15  8:51 PM  Result Value Ref Range   Troponin i, poc 0.44 (HH) 0.00 - 0.08 ng/mL   Comment NOTIFIED PHYSICIAN    Comment 3          Basic metabolic panel     Status: Abnormal   Collection Time: 10/17/15  8:57 PM  Result Value Ref Range   Sodium 138 135 - 145 mmol/L   Potassium 4.7 3.5 - 5.1 mmol/L   Chloride 101 101 - 111 mmol/L   CO2 27 22 - 32 mmol/L   Glucose, Bld 141 (H) 65 - 99 mg/dL   BUN 44 (H) 6 - 20 mg/dL   Creatinine, Ser 1.33 (H) 0.61 - 1.24 mg/dL   Calcium 8.6 (L) 8.9 - 10.3 mg/dL   GFR calc non Af Amer 47 (L) >60 mL/min   GFR calc Af Amer 55 (L) >60 mL/min   Anion gap 10 5 - 15  CBC with Differential     Status: Abnormal   Collection Time: 10/17/15  8:57 PM  Result Value Ref Range   WBC 16.8 (H) 4.0 - 10.5 K/uL   RBC 3.18 (L) 4.22 - 5.81  MIL/uL   Hemoglobin 9.5 (L) 13.0 - 17.0 g/dL   HCT 29.4 (L) 39.0 - 52.0 %   MCV 92.5 78.0 - 100.0 fL   MCH 29.9 26.0 - 34.0 pg   MCHC 32.3 30.0 - 36.0 g/dL   RDW 15.0 11.5 - 15.5 %   Platelets 212 150 - 400 K/uL   Neutrophils Relative % 82 %   Neutro Abs 13.7 (H) 1.7 - 7.7 K/uL   Lymphocytes Relative 8 %   Lymphs Abs 1.4 0.7 - 4.0 K/uL   Monocytes Relative 10 %   Monocytes Absolute 1.7 (H) 0.1 - 1.0 K/uL   Eosinophils Relative 0 %   Eosinophils Absolute 0.0 0.0 - 0.7 K/uL   Basophils Relative 0 %   Basophils Absolute 0.0 0.0 - 0.1 K/uL  Brain natriuretic peptide     Status: Abnormal   Collection Time: 10/17/15  8:57 PM  Result Value Ref Range   B Natriuretic Peptide 763.6 (H) 0.0 - 100.0 pg/mL  Protime-INR     Status: Abnormal   Collection Time: 10/17/15  8:57 PM  Result Value Ref Range   Prothrombin Time 52.4 (H) 11.6 - 15.2 seconds   INR 6.13 (HH) 0.00 - 1.49   Dg Chest Port 1 View  10/17/2015  CLINICAL DATA:  Shortness of breath, bilateral lower extremity edema for 2 weeks. Open heart surgery 2 weeks ago. History of hypertension, CHF, diabetes, coronary artery disease, dysrhythmia EXAM: PORTABLE CHEST 1 VIEW COMPARISON:  Chest x-rays dated 10/12/2015 and 10/09/2015. FINDINGS: Dense opacity at the right lung base is slightly more prominent on today's study, most likely a combination of atelectasis and small pleural effusion. Left lung remains clear. Cardiomegaly is stable. No pneumothorax  seen. No acute osseous abnormality seen. Degenerative changes again noted about the right shoulder. IMPRESSION: Dense opacity at the right lung base appears slightly more prominent on today's study, most likely a combination of atelectasis and small pleural effusion, presumably with a slight interval increase compared to the previous exam. Stable cardiomegaly. Electronically Signed   By: Franki Cabot M.D.   On: 10/17/2015 19:18   [facility] 08/08/15  Dist LAD lesion, 30% stenosed.  Mid RCA-2 lesion,  25% stenosed.  Mid Cx lesion, 30% stenosed. 1. Continued stent patency in the RCA and LAD 2. Mild diffuse CAD as outlined 3. Hemodynamics consistent with severe mitral regurgitation (large V-waves in PCWP tracing, mild pulmonary HTN)  TTE 10/12/15:  Left ventricle: The cavity size was normal. There was moderate  concentric hypertrophy. Systolic function was normal. The  estimated ejection fraction was in the range of 60% to 65%. The  study is not technically sufficient to allow evaluation of LV  diastolic function. - Aortic valve: Transvalvular velocity was within the normal range.  There was no stenosis. There was mild regurgitation. - Mitral valve: Calcified annulus. A 30 mm Sorin Memo 3D Ring  Annuloplasty was present and functioning properly. The sewing  ring was in tact and there was no rocking motion. The findings  are consistent with mild stenosis. There was trivial  regurgitation. Mean gradient (D): 5 mm Hg. Lower than 10/05/15,  when the mean gradient was 7 mmHg. Valve area by continuity  equation (using LVOT flow): 1.72 cm^2. - Left atrium: The atrium was severely dilated. - Right ventricle: The cavity size was normal. Wall thickness was  normal. Systolic function was normal. - Atrial septum: No defect or patent foramen ovale was identified  by color flow Doppler. - Tricuspid valve: There was mild regurgitation. - Pulmonary arteries: Systolic pressure was mildly increased. PA  peak pressure: 39 mm Hg (S). - Inferior vena cava: The vessel was dilated. The respirophasic  diameter changes were in the normal range (>= 50%), consistent  with elevated central venous pressure.  ASSESSMENT/PLAN: 1. Volume overload. Patient with signs and symptoms of heart failure after mitral valve repair. Hemodynamically stable and oxygenating well on RA. -- Diurese with IV lasix 65m given in ED, will assess response and redose -- repeat formal echocardiogram -- CT surgery  already consulted in the ED, will assess in the morning  2. Altered mental status. Patient is altered on exam and per wife's report, has been altered since coming home from the hospital.  Given INR >6, concern for possible intracranial bleed (has previously documented subdural hematoma on CT in '14) vs hepatic encephalopathy vs delirium from prolonged hospitalization and poor nutrition -- STAT head ct --> subdural hematoma (14 mm right holo hemispheric chronic subdural hematoma without midline shift, increased from 11 mm in '14) -- check ammonia level, consider lactulose  3. Elevated LFTs and coagulopathy. Likely due to hepatic congestion, although more of an obstructive pattern, with alk phos and t bili elevated and minimal transaminitis. Less likely due to amiodarone, which was recently initiated. -- Diuresis, as above -- Abdominal ultrasound -- Hold Coumadin -- will give low-dose vitamin K (5 mg PO), even though his INR is < 10 and he has no overt bleeding, he is at greater risk of bleeding (age, decompensated heart failure, and expanding chronic subdural hematoma on head CT)   4. Atrial fibrillation. Currently rate-controlled with metoprolol and amiodarone.   -- Continue metoprolol and amiodarone -- Hold Coumadin, given INR of  6.1  5. Coronary artery disease s/p DES to LAD and RCA in 4/16. EKG without acute ischemic changes. Troponin is mildly elevated, but this is likely demand in the setting of volume overload.  Was not discharged on DAPT, but he is almost 1 year out from intervention, so low risk for stent thrombosis.   -- Continue aspirin 81 mg, as there is no overt signs of bleeding. -- Cycle troponins -- Continue pravastatin (unclear why he is not on a high-potency statin, but this can be addressed by his primary cardiologist)  6. Leukocytosis. Normal differential. No other objective evidence of infection, likely reactive in setting of acute illness. Surgical incisions appear to be  healing well. If spikes a fever, will send cultures. Check UA given urinary urgency described by wife.  7. Normocytic anemia. No significant difference from baseline while in the hospital, will monitor closely in the setting of elevated INR  8. Poor PO intake. Likely due to volume overload.  Will reassess and consider nutrition consult if no improvement with diuresis.

## 2015-10-17 NOTE — ED Notes (Signed)
[name], PT's wife requests to be made aware of room number when he is admitted. Home phone: [phone]  Cell #: [phone]

## 2015-10-17 NOTE — Telephone Encounter (Signed)
Dr. Percival Spanish in office - routed for advice.

## 2015-10-17 NOTE — Telephone Encounter (Signed)
Spoke to [facility] nurse Havery Moros. She confirms information as follows: Pt recenly in for valve replacement by Dr. Ricard Dillon. Pre surgical wt was 215 lbs. Pt weighed at home yesterday at 225lbs. He was 223.6 this AM. Noted SOB on lying, pitting edema in legs.  Called patient. He gave permission to speak to daughter regarding his condition. She informs me he has been getting up frequently, ~q10-20 mins to go to bathroom, but not having a lot of urination. She does report he had a large void at most recent bathroom trip approx 5 mins ago. Notes pt taking 60mg  lasix BID. Last BMET on 3/10 showed stable renal fxn. Wife gave pt 80mg  lasix this AM instead of usual 60mg .  Advised for now, additional 20mg  lasix BID --> to increase dose to 80mg  BID. Will review w/ physician and note if advised OK to increase further. Recommended ER evaluation if any marked increase in SOB or fatigue, or if no urination >8-12 hrs.  Routed to [employer] to address.  Also of note, INR was markedly increased at 7.6 today.  Daughter acknowledged instructions given regarding increasing leafy greens and holding coumadin until INR recheck by [facility] on Thursday. Also acknowledged instructions to go to ER for any S&S bleeding.

## 2015-10-17 NOTE — ED Notes (Signed)
Care handoff to [place], [place]

## 2015-10-17 NOTE — ED Notes (Signed)
Pt had heart surgery approx 2 weeks ago and discharged 2 days ago. Since yesterday pt has been retaining fluid-leg swelling and urinating very little. Pt also sob. Pt appears pale and lethargic.

## 2015-10-18 ENCOUNTER — Inpatient Hospital Stay (HOSPITAL_COMMUNITY): Payer: Medicare Other

## 2015-10-18 ENCOUNTER — Other Ambulatory Visit (HOSPITAL_COMMUNITY): Payer: Medicare Other

## 2015-10-18 DIAGNOSIS — I340 Nonrheumatic mitral (valve) insufficiency: Secondary | ICD-10-CM

## 2015-10-18 DIAGNOSIS — I2510 Atherosclerotic heart disease of native coronary artery without angina pectoris: Secondary | ICD-10-CM

## 2015-10-18 DIAGNOSIS — R7989 Other specified abnormal findings of blood chemistry: Secondary | ICD-10-CM

## 2015-10-18 LAB — ECHOCARDIOGRAM COMPLETE
HEIGHTINCHES: 70 in
Weight: 3531.2 oz

## 2015-10-18 LAB — HEPATIC FUNCTION PANEL
ALT: 74 U/L — ABNORMAL HIGH (ref 17–63)
AST: 91 U/L — ABNORMAL HIGH (ref 15–41)
Albumin: 1.9 g/dL — ABNORMAL LOW (ref 3.5–5.0)
Alkaline Phosphatase: 630 U/L — ABNORMAL HIGH (ref 38–126)
BILIRUBIN DIRECT: 1.7 mg/dL — AB (ref 0.1–0.5)
BILIRUBIN INDIRECT: 1.6 mg/dL — AB (ref 0.3–0.9)
Total Bilirubin: 3.3 mg/dL — ABNORMAL HIGH (ref 0.3–1.2)
Total Protein: 5.2 g/dL — ABNORMAL LOW (ref 6.5–8.1)

## 2015-10-18 LAB — BASIC METABOLIC PANEL
ANION GAP: 12 (ref 5–15)
BUN: 37 mg/dL — ABNORMAL HIGH (ref 6–20)
CO2: 25 mmol/L (ref 22–32)
Calcium: 8.3 mg/dL — ABNORMAL LOW (ref 8.9–10.3)
Chloride: 99 mmol/L — ABNORMAL LOW (ref 101–111)
Creatinine, Ser: 1.23 mg/dL (ref 0.61–1.24)
GFR calc Af Amer: >60 mL/min (ref >60)
GFR, EST NON AFRICAN AMERICAN: 52 mL/min — AB (ref >60)
GLUCOSE: 194 mg/dL — AB (ref 65–99)
POTASSIUM: 3.5 mmol/L (ref 3.5–5.1)
Sodium: 136 mmol/L (ref 135–145)

## 2015-10-18 LAB — URINALYSIS, ROUTINE W REFLEX MICROSCOPIC
Bilirubin Urine: NEGATIVE
Glucose, UA: NEGATIVE mg/dL
KETONES UR: NEGATIVE mg/dL
LEUKOCYTES UA: NEGATIVE
Nitrite: NEGATIVE
PROTEIN: NEGATIVE mg/dL
Specific Gravity, Urine: 1.01 (ref 1.005–1.030)
pH: 5 (ref 5.0–8.0)

## 2015-10-18 LAB — URINE MICROSCOPIC-ADD ON

## 2015-10-18 LAB — CBC
HCT: 28 % — ABNORMAL LOW (ref 39.0–52.0)
HEMOGLOBIN: 9.5 g/dL — AB (ref 13.0–17.0)
MCH: 31.4 pg (ref 26.0–34.0)
MCHC: 33.9 g/dL (ref 30.0–36.0)
MCV: 92.4 fL (ref 78.0–100.0)
Platelets: 185 10*3/uL (ref 150–400)
RBC: 3.03 MIL/uL — AB (ref 4.22–5.81)
RDW: 15.1 % (ref 11.5–15.5)
WBC: 14.1 10*3/uL — AB (ref 4.0–10.5)

## 2015-10-18 LAB — TROPONIN I
TROPONIN I: 0.46 ng/mL — AB (ref <0.031)
Troponin I: 0.55 ng/mL (ref <0.031)
Troponin I: 0.41 ng/mL — ABNORMAL HIGH (ref <0.031)

## 2015-10-18 LAB — PROTIME-INR
INR: 5.23 — AB (ref 0.00–1.49)
PROTHROMBIN TIME: 46.5 s — AB (ref 11.6–15.2)

## 2015-10-18 LAB — LIPASE, BLOOD: Lipase: 33 U/L (ref 11–51)

## 2015-10-18 LAB — AMMONIA: AMMONIA: 24 umol/L (ref 9–35)

## 2015-10-18 MED ORDER — FUROSEMIDE 10 MG/ML IJ SOLN
80.0000 mg | Freq: Once | INTRAMUSCULAR | Status: AC
  Administered 2015-10-18: 80 mg via INTRAVENOUS
  Filled 2015-10-18: qty 8

## 2015-10-18 MED ORDER — ADULT MULTIVITAMIN W/MINERALS CH
1.0000 | ORAL_TABLET | Freq: Every day | ORAL | Status: DC
  Administered 2015-10-18 – 2015-10-30 (×13): 1 via ORAL
  Filled 2015-10-18 (×14): qty 1

## 2015-10-18 MED ORDER — POTASSIUM CHLORIDE CRYS ER 20 MEQ PO TBCR
40.0000 meq | EXTENDED_RELEASE_TABLET | Freq: Once | ORAL | Status: AC
  Administered 2015-10-18: 40 meq via ORAL
  Filled 2015-10-18: qty 2

## 2015-10-18 MED ORDER — SODIUM CHLORIDE 0.9 % IV SOLN
2500.0000 mg | Freq: Once | INTRAVENOUS | Status: AC
  Administered 2015-10-18: 2500 mg via INTRAVENOUS
  Filled 2015-10-18: qty 2500

## 2015-10-18 MED ORDER — VANCOMYCIN HCL 10 G IV SOLR
1250.0000 mg | INTRAVENOUS | Status: DC
  Administered 2015-10-19 – 2015-10-21 (×3): 1250 mg via INTRAVENOUS
  Filled 2015-10-18 (×4): qty 1250

## 2015-10-18 MED ORDER — ENSURE ENLIVE PO LIQD
237.0000 mL | Freq: Two times a day (BID) | ORAL | Status: DC
  Administered 2015-10-18 – 2015-10-24 (×6): 237 mL via ORAL

## 2015-10-18 MED ORDER — CEFTRIAXONE SODIUM 2 G IJ SOLR
2.0000 g | INTRAMUSCULAR | Status: AC
  Administered 2015-10-18 – 2015-10-25 (×8): 2 g via INTRAVENOUS
  Filled 2015-10-18 (×9): qty 2

## 2015-10-18 MED ORDER — METOPROLOL TARTRATE 50 MG PO TABS
50.0000 mg | ORAL_TABLET | Freq: Two times a day (BID) | ORAL | Status: DC
  Administered 2015-10-18 – 2015-10-25 (×15): 50 mg via ORAL
  Filled 2015-10-18 (×14): qty 1

## 2015-10-18 MED ORDER — FUROSEMIDE 10 MG/ML IJ SOLN
40.0000 mg | Freq: Two times a day (BID) | INTRAMUSCULAR | Status: DC
  Administered 2015-10-18 – 2015-10-21 (×6): 40 mg via INTRAVENOUS
  Filled 2015-10-18 (×6): qty 4

## 2015-10-18 MED ORDER — METOPROLOL TARTRATE 25 MG PO TABS
25.0000 mg | ORAL_TABLET | Freq: Two times a day (BID) | ORAL | Status: DC
  Administered 2015-10-18 (×2): 25 mg via ORAL
  Filled 2015-10-18 (×2): qty 1

## 2015-10-18 MED ORDER — PHYTONADIONE 5 MG PO TABS
5.0000 mg | ORAL_TABLET | Freq: Once | ORAL | Status: AC
  Administered 2015-10-18: 5 mg via ORAL
  Filled 2015-10-18: qty 1

## 2015-10-18 NOTE — Care Management Note (Signed)
Case Management Note  Patient Details  Name: RODOLPHE RETTIG MRN: [id number] Date of Birth: 06-14-1929  Subjective/Objective:  82 y.o. M admitted 10/17/2015 with CHF, SOB, BLE and AF. Continues on IV Lasix. INR 6.13 on admission and was given Vitamin K+. [facility] following. 2D Echo completed Today. PT evaluation pending.                   Action/Plan: CM will continue to follow for disposition/Discharge needs.    Expected Discharge Date:                  Expected Discharge Plan:     In-House Referral:     Discharge planning Services     Post Acute Care Choice:    Choice offered to:     DME Arranged:    DME Agency:     HH Arranged:    [place] Agency:     Status of Service:     Medicare Important Message Given:    Date Medicare IM Given:    Medicare IM give by:    Date Additional Medicare IM Given:    Additional Medicare Important Message give by:     If discussed at [place] of Stay Meetings, dates discussed:    Additional Comments:  Delrae Sawyers, RN 10/18/2015, 2:29 PM

## 2015-10-18 NOTE — Progress Notes (Signed)
Patient transferred from the ED via stretcher to room 3E28. VSS, Oriented and educated patient to the heart failure floor. Verified Heart monitor and placement of telemetry leads .Currently has no discomfort or any pain issues. Assigned nurse will continue to monitor patient to end of shift.

## 2015-10-18 NOTE — Telephone Encounter (Signed)
Spoke w/ Dr. Percival Spanish - recommendation to add pt for APP visit next available.  I have left msg for patient to call.

## 2015-10-18 NOTE — Telephone Encounter (Signed)
Spoke to patient's son he stated father admitted to [facility] 10/17/15.

## 2015-10-18 NOTE — Consult Note (Signed)
   [facility] CM Inpatient Consult   10/18/2015  KAILLOU CUPPETT 01/01/1929 [id number] Patient is currently active with [place] [facility] for chronic disease management services as a benefit of his [employer].  Patient has been engaged by a [employer].  Our community based plan of care has focused on disease management and community resource support.  Went by to see the patient and he was being worked with nursing care at this time.  Active consent on file.   Patient will receive a post discharge transition of care call and will be evaluated for monthly home visits for assessments and disease process education.  Made Inpatient Case Manager aware that [place] Management following. Of note, [facility] Care Management services does not replace or interfere with any services that are needed or arranged by inpatient case management or social work.  For additional questions or referrals please contact: Natividad Brood, RN BSN [facility] Liaison  [phone] business mobile phone Toll free office [phone]

## 2015-10-18 NOTE — Progress Notes (Addendum)
[name] [address]       [place] [address]             [phone]             Subjective: Patient alert and oriented.  Objective: Vital signs in last 24 hours: Temp:  [97.6 F (36.4 C)-98.6 F (37 C)] 97.6 F (36.4 C) (03/15 0211) Pulse Rate:  [71-85] 85 (03/15 0211) Cardiac Rhythm:  [-] Atrial fibrillation (03/15 0207) Resp:  [15-24] 22 (03/15 0211) BP: (90-126)/(62-76) 122/65 mmHg (03/15 0211) SpO2:  [77 %-98 %] 96 % (03/15 0211) Weight:  [220 lb 11.2 oz (100.109 kg)] 220 lb 11.2 oz (100.109 kg) (03/15 0211)  Current Weight  10/18/15 220 lb 11.2 oz (100.109 kg)       Intake/Output from previous day: 03/14 0701 - 03/15 0700 In: 240 [P.O.:240] Out: 1990 [Urine:1990]   Physical Exam:  Cardiovascular: RRR, murmur  Pulmonary: Crackles Abdomen: Soft, non tender, bowel sounds present. Extremities: +++ bilateral lower extremity edema. Wounds: Clean and dry.  No erythema or signs of infection.  Lab Results: CBC: Recent Labs  10/17/15 2057 10/18/15 0530  WBC 16.8* 14.1*  HGB 9.5* 9.5*  HCT 29.4* 28.0*  PLT 212 185   BMET:  Recent Labs  10/17/15 2057 10/18/15 0530  NA 138 136  K 4.7 3.5  CL 101 99*  CO2 27 25  GLUCOSE 141* 194*  BUN 44* 37*  CREATININE 1.33* 1.23  CALCIUM 8.6* 8.3*    PT/INR:  Lab Results  Component Value Date   INR 5.23* 10/18/2015   INR 6.13* 10/17/2015   INR 7.6* 10/16/2015   ABG:  INR: Will add last result for INR, ABG once components are confirmed Will add last 4 CBG results once components are confirmed  Assessment/Plan:  1. CV - S/p mini MV repair on 10/04/2015. A fib.On Amiodarone 200 mg daily and Lopressor 25 mg bid. Toponin I down from 0.55 to 0.41. Echo done at bedside in ER showed LVEF 55%, possible RV dilation, low normal function, no significant MR, LA enlargement, and small pericardial effusion but no tamponade. Cardiology following. Await 2 D echo results 2.  AMS-Ammonia level was 24  (WNL).CT head showed 14 mm RIGHT holo hemispheric chronic subdural hematoma without midline shift (increased from 11 mm in '14) 3. Volume Overload - Was given 80 mg IV Lasix in ED and earlier this am.Will discuss daily regimen with Dr. Roxy Manns. 4.  Normocytic anemia - H and H 9.5 and 28 5. Supplement potassium 6. Elevated transaminases, alk phos, and bilirubin. Abd US showed stones and sludge within GB and GB wall thickening, possible mild hepatic cirrhosis, minimal ascites. 7. Coagulopathy-INR was 6.13 in ED. Was given Vitamin K  Coumadin on hold. INR this am 5.23 8. Leukocytosis (WBC decreased to 14,100) with NO fever. UA negative, CXR did not show evidence of pneumonia, and no sign of wound infection.  9. GU-history of prostate cancer.  On Proscar 5 mg daily and Flomax 0.4 mg daily. Bloody urine in foley bag.  [name],[name] 10/18/2015,10:08 AM  I have seen and examined the patient and agree with the assessment and plan as outlined.  [name] is well known to me and was just recently discharged from the hospital.  It is not surprising to me that he was readmitted for numerous reasons.  1. It may be challenging to manage his chronic diastolic CHF indefinitely.  I have discussed this at length with  Dr. Percival Spanish.  He has severe LVH with diastolic dysfunction, hypoalbuminemia due to poor nutrition, new-onset persistent atrial fibrillation, and a tendency to develop dynamic LVOT obstruction due to [facility].  If he gets over-diuresed he might redevelop SAM and decompensate fairly quickly.  2.  His WBC is somewhat elevated - 16,800 as of yesterday up from 9,400 one week ago.  He has been afebrile with no obvious source, although he does have slightly increased opacity right lung base  He probably should be covered with antibiotics for possible HCAP.  I would favor removing his foley catheter as soon as possible.  Results of abdominal U/S notable for gallstones, sludge and mild GB wall thickening  but no definite signs of cholecystitis.  Abdominal exam benign.  Surgical incisions look good.  3. I am not confident that he will do well at home even with home health nursing, PT and OT arrangements in place.  His wife seems to be overwhelmed by numerous issues at home.  His readmission seems to confirm this.  Ultimately he might better be served by short term SNF placement  4.  His occasional garbled speech, impulsive tendencies and occasional mild confusion are consistent with his baseline.  5.  I have personally reviewed his follow up ECHO performed earlier today.  It looks good with no ongoing SAM or LVOT obstruction.  The mitral valve repair remains intact with no residual MR.  There is a trivial pericardial effusion.  6.  He was not discharged from the hospital on DAPT because he was anticoagulated using warfarin for persistent Afib and recent mitral valve repair.  I do not feel that he should be on warfarin and DAPT at the same time for many reasons that should be fairly obvious.  7.  At some point it might be reasonable to consider DCCV.  I think he would do better in sinus rhythm.  He may have room to increase his dose of beta blocker further - will increase to 50 bid.  In the absence of complications related to his surgery I will defer his further medical Rx to Dr Percival Spanish and the Cardiology team.     Rexene Alberts, MD 10/18/2015 5:04 PM

## 2015-10-18 NOTE — Progress Notes (Signed)
   10/18/15 0211  Vitals  Temp 97.6 F (36.4 C)  Temp Source Oral  BP 122/65 mmHg  BP Location Left Arm  BP Method Automatic  Patient Position (if appropriate) Lying  Pulse Rate 85  Pulse Rate Source Dinamap  Resp (!) 22  Oxygen Therapy  SpO2 96 %  O2 Device Room Air  Height and Weight  Height 5\' 10"  (1.778 m)  Weight 100.109 kg (220 lb 11.2 oz) (scale B)  Type of Scale Used Standing  Type of Weight Actual  BSA (Calculated - sq m) 2.22 sq meters  BMI (Calculated) 31.7  Weight in (lb) to have BMI = 25 173.9  Admitted pt to rm 3E28 from ED, pt alert and oriented, denied pain at this time, oriented to room, call bell placed within reach, placed on cardiac monitor, CCMD made aware. Admission assessment done, orders carried out. Will continue to monitor. Wiatt Mahabir, Blanch Media, RN

## 2015-10-18 NOTE — Progress Notes (Signed)
CRITICAL VALUE ALERT  Critical value received: Trop=0.55  Date of notification:  10/18/15  Time of notification:  0307  Critical value read back: yes  Nurse who received alert:  G. Tishanna Dunford  MD notified (1st page):  Dr. Margaretha Seeds  Time of first page:  0310  MD notified (2nd page):  Time of second page:  Responding MD:  Dr. Margaretha Seeds  Time MD responded:  [phone]

## 2015-10-18 NOTE — Progress Notes (Signed)
Pharmacy Antibiotic Note  [name] is a 82 y.o. male admitted on 10/17/2015 with pneumonia.  Pharmacy has been consulted for Ceftriaxone and Vancomycin dosing. WBC elevated at 14.1. Afebrile. CrCl ~ 50-55 mL/min   Plan: - Vancomycin 2500 mg IV load followed by Vancomycin 1250 IV every 24 hours.  Goal trough 15-20 mcg/mL.  -Ceftriaxone 2 gm IV Q 24 hours -Monitor CBC, renal fx, cultures and clinical progress -VT at SS   Height: 5\' 10"  (177.8 cm) Weight: 220 lb 11.2 oz (100.109 kg) (scale B) IBW/kg (Calculated) : 73  Temp (24hrs), Avg:97.7 F (36.5 C), Min:97 F (36.1 C), Max:98.6 F (37 C)   Recent Labs Lab 10/12/15 0309 10/17/15 2057 10/18/15 0530  WBC  --  16.8* 14.1*  CREATININE 0.92 1.33* 1.23    Estimated Creatinine Clearance: 53 mL/min (by C-G formula based on Cr of 1.23).    No Known Allergies  Antimicrobials this admission: 3/15 Vanc>> 3/15 Ceftriaxone>>   Dose adjustments this admission: none  Microbiology results: None   Thank you for allowing pharmacy to be a part of this patient's care.  Albertina Parr, PharmD., BCPS Clinical Pharmacist Pager [phone]

## 2015-10-18 NOTE — Progress Notes (Signed)
Patient Name: [name] Date of Encounter: 10/18/2015  Primary Cardiologist: Dr. Percival Spanish Pt profile:  Mr. Certain is an 82 yo white male with long-standing history of mitral valve prolapse with mitral regurgitation and chronic diastolic congestive heart failure, coronary artery disease status post PCI and stenting of the LAD and RCA using drug-eluting stents, recurrent SVT s/p DCCV, pulmonary hypertension, type 2 diabetes mellitus, hyperlipidemia, and chronic back pain who recently underwent minimally invasive Mitral Valve Repair 10/04/15 presented with SOB and LE edema 10/17/15.    SUBJECTIVE  Breathing improved. Somewhat confused.   CURRENT MEDS . amiodarone  200 mg Oral Daily  . aspirin EC  81 mg Oral Daily  . feeding supplement (ENSURE ENLIVE)  237 mL Oral BID BM  . finasteride  5 mg Oral Daily  . metoprolol tartrate  25 mg Oral BID  . multivitamin with minerals  1 tablet Oral Daily  . pravastatin  40 mg Oral q1800  . sodium chloride flush  3 mL Intravenous Q12H  . tamsulosin  0.4 mg Oral Daily    OBJECTIVE  Filed Vitals:   10/18/15 0100 10/18/15 0211 10/18/15 1112 10/18/15 1222  BP: 115/66 122/65 99/53 108/61  Pulse: 84 85 81 88  Temp:  97.6 F (36.4 C) 97 F (36.1 C) 97.4 F (36.3 C)  TempSrc:  Oral  Oral  Resp: '22 22 22 20  ' Height:  '5\' 10"'  (1.778 m)    Weight:  220 lb 11.2 oz (100.109 kg)    SpO2: 98% 96% 98% 100%    Intake/Output Summary (Last 24 hours) at 10/18/15 1412 Last data filed at 10/18/15 1117  Gross per 24 hour  Intake    363 ml  Output   2240 ml  Net  -1877 ml   Filed Weights   10/17/15 2355 10/18/15 0211  Weight: 220 lb 11.2 oz (100.109 kg) 220 lb 11.2 oz (100.109 kg)    PHYSICAL EXAM  General: Pleasant, NAD. Somewhat confused however answers questions. Follow commands.  Neuro: Alert and oriented X 3. Moves all extremities spontaneously. Psych: Somewhat consued HEENT:  Normal  Neck: Supple without bruits or JVD. Lungs:  Resp  regular and unlabored. Bibasilar crackles.  Heart: ir ir with distance heart sound.  Murmurs heard.  Abdomen: Soft, non-tender, non-distended, BS + x 4.  Extremities: No clubbing, cyanosis. 3-4+ BL LE edema. DP 1+ and equal bilaterally.  Accessory Clinical Findings  CBC  Recent Labs  10/17/15 2057 10/18/15 0530  WBC 16.8* 14.1*  NEUTROABS 13.7*  --   HGB 9.5* 9.5*  HCT 29.4* 28.0*  MCV 92.5 92.4  PLT 212 003   Basic Metabolic Panel  Recent Labs  10/17/15 2057 10/18/15 0530  NA 138 136  K 4.7 3.5  CL 101 99*  CO2 27 25  GLUCOSE 141* 194*  BUN 44* 37*  CREATININE 1.33* 1.23  CALCIUM 8.6* 8.3*   Liver Function Tests  Recent Labs  10/17/15 2308 10/18/15 0530  AST 85* 91*  ALT 77* 74*  ALKPHOS 621* 630*  BILITOT 3.2* 3.3*  PROT 5.5* 5.2*  ALBUMIN 2.0* 1.9*    Recent Labs  10/18/15 0530  LIPASE 33   Cardiac Enzymes  Recent Labs  10/18/15 0225 10/18/15 0815  TROPONINI 0.55* 0.41*    TELE  afib at rate of 80s  Radiology/Studies  Dg Chest 2 View  10/12/2015  CLINICAL DATA:  82 year old male with recent cardiac surgery. Recent right chest tube for pneumothorax. Initial encounter. EXAM: CHEST  2 VIEW COMPARISON:  10/11/2015 and earlier. FINDINGS: Right chest tube removed. Trace if any right apical pneumothorax today, regressed since yesterday. Streaky peripheral and basilar opacity on the right has not significantly changed. Mildly improved lung volumes. Stable cardiac size and mediastinal contours. The patient arms are raised on the lateral view limiting its utility. Small left pleural effusion. No acute pulmonary edema. No areas of worsening ventilation. Stable visualized osseous structures. Flowing osteophytes or syndesmophytes in the thoracic spine. IMPRESSION: 1. Right chest tube removed with trace if any residual right apical pneumothorax. 2. Small pleural effusions. Stable patchy peripheral and basilar opacity in the right lung. Electronically Signed    By: Genevie Ann M.D.   On: 10/12/2015 07:57   Dg Chest 2 View  10/11/2015  CLINICAL DATA:  Chest discomfort, chest tube in place on the right, recent mitral valve repair and subsequent wound exploration. EXAM: CHEST  2 VIEW COMPARISON:  Portable chest x-ray of October 09, 2015 FINDINGS: There is persistent mild volume loss on the right. There is a pneumothorax on the right amounting to approximately 10% or less of the lung volume. There is infiltrate in the right upper lobe which accentuates the pleural line. Patchy interstitial density in the right mid and lower lung is stable. The upper right-sided chest tube tip projects over the posterior aspect of the fifth rib. The lower chest tube tip is not well demonstrated but appears to lie in the inferior aspect of the right pleural space. There remains a small right pleural effusion. There is no mediastinal shift. On the left there is basilar atelectasis and possible trace pleural effusion. The mid and upper lung are clear. The cardiac silhouette remains enlarged and the central pulmonary vascularity remains engorged. IMPRESSION: 10% or less right apical pneumothorax. The right-sided chest tubes are in stable position. Persistent interstitial and alveolar opacities on the right. Minimal left basilar atelectasis. Stable cardiomegaly with mild central pulmonary vascular prominence. Critical Value/emergent results were called by telephone at the time of interpretation on 10/11/2015 at 7:54 am to Shirley Muscat, RN, , who verbally acknowledged these results. Electronically Signed   By: David  Martinique M.D.   On: 10/11/2015 07:50   Dg Chest 2 View  10/02/2015  CLINICAL DATA:  Preoperative assessment for cardiac surgery ; history hypertension, coronary artery disease, pulmonary hypertension, SVT, mitral regurgitation, chronic diastolic congestive heart failure EXAM: CHEST  2 VIEW COMPARISON:  07/05/2015 FINDINGS: Enlargement of cardiac silhouette. Mild atherosclerotic calcification  aorta. Mediastinal contours and pulmonary vascularity normal. Lungs clear. No pleural effusion or pneumothorax. Diffuse osseous demineralization. IMPRESSION: Enlargement of cardiac silhouette. No acute abnormalities. Electronically Signed   By: Lavonia Dana M.D.   On: 10/02/2015 14:48   Ct Head Wo Contrast  10/18/2015  CLINICAL DATA:  Shortness of breath, altered mental status after heart surgery 2 weeks ago. History of diabetes, hypertension, prostate cancer. EXAM: CT HEAD WITHOUT CONTRAST TECHNIQUE: Contiguous axial images were obtained from the base of the skull through the vertex without intravenous contrast. COMPARISON:  CT head November 24, 2012 FINDINGS: The ventricles and sulci are normal for age. Symmetric basal ganglia mineralization. No intraparenchymal hemorrhage. Patchy supratentorial white matter hypodensities are within normal range for patient's age and though non-specific suggest sequelae of chronic small vessel ischemic disease. No acute large vascular territory infarcts. RIGHT holo hemispheric cerebral spinal fluid equivalent subdural collection was 11 mm, now 14 mm. Mass effect on subjacent sulci without midline shift. Basal cisterns are patent. Minimal calcific atherosclerosis of  the carotid siphons. No skull fracture. The included ocular globes and orbital contents are non-suspicious. Status post bilateral ocular lens implants. Bilateral maxillary sinus mucosal retention cysts without air-fluid levels. Imaged mastoid air cells are well aerated. Patient is edentulous. IMPRESSION: 14 mm RIGHT holo hemispheric chronic subdural hematoma without midline shift. Otherwise negative CT head for age. Electronically Signed   By: Elon Alas M.D.   On: 10/18/2015 01:47   US Abdomen Complete  10/18/2015  CLINICAL DATA:  Acute onset of elevated LFTs.  Initial encounter. EXAM: ABDOMEN ULTRASOUND COMPLETE COMPARISON:  CTA of the abdomen and pelvis from 08/02/2015 FINDINGS: Gallbladder: Stones and  sludge are noted within the gallbladder. Gallbladder wall thickening is noted, measuring up to 5 mm. No ultrasonographic Murphy's sign is elicited. No pericholecystic fluid is seen. Common bile duct: Diameter: 0.5 cm, within normal limits in caliber. Liver: No focal lesion identified. Somewhat coarsened hepatic echotexture, raising concern for fatty infiltration. The hepatic contour is slightly nodular, raising question for mild hepatic cirrhosis. IVC: No abnormality visualized. Pancreas: Visualized portion unremarkable. Spleen: Size and appearance within normal limits. Right Kidney: Length: 11.0 cm. Echogenicity within normal limits. No mass or hydronephrosis visualized. A small amount of fluid is noted between the liver and right kidney. Left Kidney: Length: 10.7 cm. Echogenicity within normal limits. No mass or hydronephrosis visualized. Abdominal aorta: No aneurysm visualized. Not well characterized proximally or distally due to overlying structures. Other findings: None. IMPRESSION: 1. Stones and sludge within the gallbladder. Gallbladder wall thickening noted. This may reflect chronic inflammation, though would correlate with the patient's symptoms to exclude acute cholecystitis. No evidence of obstruction. 2. Slightly nodular hepatic contour raises question for mild hepatic cirrhosis. Underlying fatty infiltration noted. 3. Trace fluid between the liver and right kidney reflects minimal ascites. Electronically Signed   By: Garald Balding M.D.   On: 10/18/2015 00:58   Dg Chest Port 1 View  10/17/2015  CLINICAL DATA:  Shortness of breath, bilateral lower extremity edema for 2 weeks. Open heart surgery 2 weeks ago. History of hypertension, CHF, diabetes, coronary artery disease, dysrhythmia EXAM: PORTABLE CHEST 1 VIEW COMPARISON:  Chest x-rays dated 10/12/2015 and 10/09/2015. FINDINGS: Dense opacity at the right lung base is slightly more prominent on today's study, most likely a combination of atelectasis  and small pleural effusion. Left lung remains clear. Cardiomegaly is stable. No pneumothorax seen. No acute osseous abnormality seen. Degenerative changes again noted about the right shoulder. IMPRESSION: Dense opacity at the right lung base appears slightly more prominent on today's study, most likely a combination of atelectasis and small pleural effusion, presumably with a slight interval increase compared to the previous exam. Stable cardiomegaly. Electronically Signed   By: Franki Cabot M.D.   On: 10/17/2015 19:18   Dg Chest Port 1 View  10/09/2015  CLINICAL DATA:  Status post mitral valve repair EXAM: PORTABLE CHEST 1 VIEW COMPARISON:  10/08/2015 FINDINGS: The cardiac shadow is enlarged but stable. Two right-sided chest tubes are again seen and stable no pneumothorax is noted. Right basilar atelectasis is noted slightly progressed when compare with the prior exam. No bony abnormality is noted. IMPRESSION: Increased right basilar atelectatic changes. Thoracostomy catheters without pneumothorax. Although not mentioned in the body of the report, a left jugular sheath remains. Electronically Signed   By: Inez Catalina M.D.   On: 10/09/2015 07:16   Dg Chest Port 1 View  10/08/2015  CLINICAL DATA:  Post MVR 10/04/2015, history mitral valve prolapse, coronary artery disease, diabetes  mellitus, hypertension, former smoker, pulmonary hypertension, chronic diastolic heart failure EXAM: PORTABLE CHEST 1 VIEW COMPARISON:  Portable exam 0512 hours compared to 10/07/2015 FINDINGS: RIGHT thoracostomy tubes unchanged. LEFT jugular central venous catheter unchanged. Epicardial pacing leads noted. Enlargement of cardiac silhouette with pulmonary vascular congestion. Persistent atelectasis and minimal effusion at mid inferior RIGHT chest. Mild LEFT basilar atelectasis and small LEFT pleural effusion. No definite pneumothorax. IMPRESSION: RIGHT thoracostomy tubes with persistent basilar atelectasis and small effusion without  pneumothorax per enlargement of cardiac silhouette with pulmonary vascular congestion. Persistent mile LEFT basilar atelectasis and small pleural effusion. Electronically Signed   By: Lavonia Dana M.D.   On: 10/08/2015 07:14   Dg Chest Port 1 View  10/07/2015  CLINICAL DATA:  Congestive heart failure. EXAM: PORTABLE CHEST 1 VIEW COMPARISON:  October 06, 2015 FINDINGS: The PA catheter has been removed with a left IJ sheath remaining. Other support apparatus is stable. No pneumothorax identified. Bilateral layering effusions with underlying atelectasis are stable. More diffuse opacity remains in the right mid and lower lung. Stable cardiomegaly. No other interval changes. IMPRESSION: Interval removal of the PA catheter. Small bilateral layering effusions with underlying atelectasis. Persistent opacity in the right mid and lower lung. Electronically Signed   By: Dorise Bullion III M.D   On: 10/07/2015 07:02   Dg Chest Port 1 View  10/06/2015  CLINICAL DATA:  Status postextubation.  Recent mitral valve repair EXAM: PORTABLE CHEST 1 VIEW COMPARISON:  October 05, 2015 FINDINGS: Endotracheal tube is in nasogastric tube have been removed. Swan-Ganz catheter tip is in the main pulmonary outflow tract. There is a chest tube on the right. Temporary pacemaker wires are attached to the right heart. No pneumothorax. There is persistent right effusion with right middle and lower lobe volume loss with patchy airspace opacity in these areas. There is also patchy airspace opacity in the left lower lobe. There is cardiomegaly. The pulmonary vascularity shows evidence of mild pulmonary venous hypertension. No adenopathy evident. IMPRESSION: Tube and catheter positions as described without pneumothorax. Findings consistent with a degree of congestive heart failure. Atelectasis and possibly pneumonia in the right middle and both lower lobes cannot be excluded. The appearance is similar to 1 day prior. Electronically Signed   By: Lowella Grip III M.D.   On: 10/06/2015 07:55   Dg Chest Port 1 View  10/05/2015  CLINICAL DATA:  Status post mitral valve repair postop 1 and sternal re- incision. EXAM: PORTABLE CHEST 1 VIEW COMPARISON:  10/05/2015 and prior exam FINDINGS: An endotracheal tube with tip 4.2 cm above the carina, left IJ Swan-Ganz catheter with tip overlying the main pulmonary artery, and mediastinal/right thoracostomy tubes again noted. Improved right lung aeration noted with decreased edema/ airspace opacity. Bilateral lower lung opacities/ atelectasis/consolidation again noted. There is no evidence of pneumothorax. No other significant changes identified. IMPRESSION: Improved aeration bilaterally with decreased right lung edema/airspace opacity. Support apparatus as described.  No evidence of pneumothorax. Continued bilateral lower lung opacities. Electronically Signed   By: Margarette Canada M.D.   On: 10/05/2015 18:31   Dg Chest Port 1 View  10/05/2015  CLINICAL DATA:  Atelectasis. EXAM: PORTABLE CHEST 1 VIEW COMPARISON:  10/04/2015. FINDINGS: Endotracheal tube 1.7 cm above the carina, slight proximal repositioning should be considered. Swan-Ganz catheter noted with tip in the pulmonary outflow tract. NG tube noted with tip below left hemidiaphragm. Right chest tubes and mediastinal drainage catheter in stable position. Stable cardiomegaly. Cardiac valve replacement. Progressive bibasilar atelectasis and/or  infiltrates.No pneumothorax. Small bilateral pleural effusions. No acute bony abnormality . Cervical spine fusion. IMPRESSION: 1. Endotracheal tube 1.7 cm above the carina, slight proximal repositioning should be considered. Remaining lines and tubes including right chest tubes in stable position. No pneumothorax 2. Low lung volumes with progressive bibasilar atelectasis and/or infiltrates/edema. Small bilateral pleural effusions. 3.  Persistent cardiomegaly.  Prior cardiac valve replacement. Electronically Signed   By: Marcello Moores   Register   On: 10/05/2015 08:06   Dg Chest Port 1 View  10/04/2015  CLINICAL DATA:  Minimally invasive mitral valve repair a EXAM: PORTABLE CHEST 1 VIEW COMPARISON:  10/02/2015 FINDINGS: Endotracheal tube 4 cm from carina. NG tube in stomach. Swan-Ganz catheter with tip in the main pulmonary artery. Two RIGHT chest tubes in place. Volume loss in the RIGHT hemi thorax. This atelectasis the RIGHT upper lobe RIGHT lower lobe with small effusion. No pneumothorax. LEFT lung clear. IMPRESSION: 1. Support apparatus appears in good position. 2. RIGHT lung atelectasis, effusion and 2 chest tubes in RIGHT hemi thorax. Electronically Signed   By: Suzy Bouchard M.D.   On: 10/04/2015 17:41    Echo 10/18/15 LV EF: 55% - 60%  ------------------------------------------------------------------- Indications: MVD [non-rheumatic] 424.0.  ------------------------------------------------------------------- History: PMH: Lower Extremity Edema. Dyspnea. Coronary artery disease. Primary pulmonary hypertension. Risk factors: Hypertension. Diabetes mellitus. Dyslipidemia.  ------------------------------------------------------------------- Study Conclusions  - Left ventricle: The cavity size was normal. There was severe  focla hypertrophy of the basal septum and mild hypertrophy of the  posterior wall. Systolic function was normal. The estimated  ejection fraction was in the range of 55% to 60%. The study is  not technically sufficient to allow evaluation of LV diastolic  function. - Regional wall motion abnormality: Hypokinesis of the basal-mid  anteroseptal and basal-mid inferoseptal myocardium. - Aortic valve: There was mild stenosis. There was trivial  regurgitation. Peak velocity (S): 284 cm/s. Mean gradient (S): 16  mm Hg. - Mitral valve: Prior procedures included surgical repair with a 30  mm Sorin Memo 3D annuloplasty ring. The sewing ring had no  rocking motion and showed  no evidence of dehiscence. The findings  are consistent with moderate stenosis. Mean gradient (D): 6 mm  Hg. Gradient relatively unchanged from prior. - Left atrium: The atrium was severely dilated. - Right ventricle: Systolic function was mildly reduced. - Right atrium: The atrium was moderately dilated. - Atrial septum: No defect or patent foramen ovale was identified  by color flow Doppler. - Tricuspid valve: There was mild-moderate regurgitation. - Pulmonic valve: There was mild regurgitation. - Pulmonary arteries: Systolic pressure was moderately to severely  increased. PA peak pressure: 47 mm Hg (S). - Inferior vena cava: The vessel was dilated. The respirophasic  diameter changes were blunted (< 50%), consistent with elevated  central venous pressure. - Pericardium, extracardiac: A trivial pericardial effusion was  identified.   ASSESSMENT AND PLAN   1. Volume overload. Patient with signs and symptoms of heart failure after mitral valve repair. Hemodynamically stable and oxygenating well on RA. -- Given IV lasix 89m x 2 with negative 1.8L. Dr. ORoxy Mannsto decided daily regimen.  - Echo done today as above.   2. Altered mental status.  Given INR >6, concern for possible intracranial bleed (has previously documented subdural hematoma on CT in '14) vs hepatic encephalopathy vs delirium from prolonged hospitalization and poor nutrition -- STAT head ct showed subdural hematoma (14 mm right holo hemispheric chronic subdural hematoma without midline shift, increased from 11 mm in '14) --  check ammonia level was 24 (WNL). Consider neurology consult.   3. Elevated LFTs and coagulopathy. Likely due to hepatic congestion, although more of an obstructive pattern, with alk phos and t bili elevated and minimal transaminitis. Less likely due to amiodarone, which was recently initiated. -- Abdominal ultrasound showed stones and sludge within GB and GB wall thickening, possible mild  hepatic cirrhosis, minimal ascites.  -- Pending CT of abdomen -- Held Coumadin -- given Vitamin K, even though his INR is < 10 and he has no overt bleeding, he is at greater risk of bleeding (age, decompensated heart failure, and expanding chronic subdural hematoma on head CT)   4. Atrial fibrillation. Currently rate-controlled -- Continue metoprolol and amiodarone -- Held Coumadin  5. Coronary artery disease s/p DES to LAD and RCA in 4/16. EKG without acute ischemic changes. Troponin is mildly elevated (0.55-->0.41), but this is likely demand in the setting of volume overload. Was not discharged on DAPT, but he is almost 1 year out from intervention, so low risk for stent thrombosis.  -- Continue aspirin 81 mg, as there is no overt signs of bleeding. -- Continue pravastatin (unclear why he is not on a high-potency statin, but this can be addressed by his primary cardiologist)  6. Leukocytosis. Normal differential. No other objective evidence of infection, likely reactive in setting of acute illness. Surgical incisions appear to be healing well. If spikes a fever, will send cultures. UA clear.   7. Normocytic anemia. No significant difference from baseline while in the hospital, will monitor closely in the setting of elevated INR  8. Poor PO intake. Likely due to volume overload. Will reassess and consider nutrition consult if no improvement with diuresis.  Signed, Leanor Kail PA-C Pager [phone]   The patient was seen, examined and discussed with Bhagat,Bhavinkumar PA-C and I agree with the above.   Mr. Cavitt is an 82 yo white male, with known CAD, s/p PCI and stenting of the LAD and RCA/DES, recurrent SVT s/p DCCV, pulmonary hypertension, type 2 diabetes mellitus, hyperlipidemia, and chronic back pain who recently underwent minimally invasive Mitral Valve Repair 10/05/15 presented with SOB and LE edema 10/17/15. Acute on chronic diastolic CHF, LVEF 26-83%, Responded well to two  doses on lasix 80 mg iv, still fluid overloaded, I would continue lasix 40 mg iv BID and follow crea closely. His elevated LFTs most probably sec to CHF. A-fib is rate controlled, BP is controlled, we will follow.   Dorothy Spark 10/18/2015

## 2015-10-18 NOTE — Telephone Encounter (Signed)
2nd attempt. [facility] tomorrow.

## 2015-10-18 NOTE — Progress Notes (Signed)
  Echocardiogram 2D Echocardiogram has been performed.  Darlina Sicilian M 10/18/2015, 11:20 AM

## 2015-10-18 NOTE — Progress Notes (Signed)
  Echocardiogram 2D Echocardiogram has been performed.  Darlina Sicilian M 10/18/2015, 11:15 AM

## 2015-10-18 NOTE — Progress Notes (Addendum)
Initial Nutrition Assessment  DOCUMENTATION CODES:   Obesity unspecified  INTERVENTION:    Continue Ensure Enlive po BID, each supplement provides 350 kcal and 20 grams of protein  Multivitamin daily  Consider swallow evaluation, patient with some difficulty swallowing  NUTRITION DIAGNOSIS:   Malnutrition related to chronic illness as evidenced by mild depletion of body fat, mild depletion of muscle mass.  GOAL:   Patient will meet greater than or equal to 90% of their needs  MONITOR:   PO intake, Supplement acceptance, Labs, Weight trends, Skin, I & O's  REASON FOR ASSESSMENT:   Malnutrition Screening Tool    ASSESSMENT:   82 yo M with h/o mitral valve repair on 10/04/15 who presents with shortness of breath and lower extremity edema. Hx CHF, admitted with fluid overload. Patient with confusion and minimal intake at home since discharge from hospital on 3/10.   Patient reports recent weight loss, unsure of amount, but states it is related to fluid loss. PTA patient with some difficulty swallowing. He likes milk and Ensure supplements. Intake of meals is poor, </= 30% meal completion. Nutrition-Focused physical exam completed. Findings are mild-moderate fat depletion, mild-moderate muscle depletion, and moderate edema. Patient with moderate PCM. Also needs increased protein to support wound healing.  Diet Order:  Diet Heart Room service appropriate?: Yes; Fluid consistency:: Thin  Skin:  Wound (see comment) (stage 2 pressure injury to buttocks)  Last BM:  3/14  Height:   Ht Readings from Last 1 Encounters:  10/18/15 5\' 10"  (1.778 m)    Weight:   Wt Readings from Last 1 Encounters:  10/18/15 220 lb 11.2 oz (100.109 kg)    Ideal Body Weight:  75.5 kg  BMI:  Body mass index is 31.67 kg/(m^2).  Estimated Nutritional Needs:   Kcal:  2000-2200  Protein:  110-130 gm  Fluid:  1.8 L  EDUCATION NEEDS:   No education needs identified at this time  Molli Barrows, [place], [place], [place] Pager [phone] After Hours Pager [phone]

## 2015-10-19 ENCOUNTER — Inpatient Hospital Stay (HOSPITAL_COMMUNITY): Payer: Medicare Other

## 2015-10-19 DIAGNOSIS — L8990 Pressure ulcer of unspecified site, unspecified stage: Secondary | ICD-10-CM | POA: Insufficient documentation

## 2015-10-19 LAB — CBC
HEMATOCRIT: 30.2 % — AB (ref 39.0–52.0)
HEMOGLOBIN: 9.5 g/dL — AB (ref 13.0–17.0)
MCH: 29.4 pg (ref 26.0–34.0)
MCHC: 31.5 g/dL (ref 30.0–36.0)
MCV: 93.5 fL (ref 78.0–100.0)
Platelets: 208 10*3/uL (ref 150–400)
RBC: 3.23 MIL/uL — ABNORMAL LOW (ref 4.22–5.81)
RDW: 15.1 % (ref 11.5–15.5)
WBC: 12.9 10*3/uL — AB (ref 4.0–10.5)

## 2015-10-19 LAB — BASIC METABOLIC PANEL
ANION GAP: 10 (ref 5–15)
BUN: 38 mg/dL — ABNORMAL HIGH (ref 6–20)
CHLORIDE: 101 mmol/L (ref 101–111)
CO2: 25 mmol/L (ref 22–32)
CREATININE: 1.24 mg/dL (ref 0.61–1.24)
Calcium: 8.3 mg/dL — ABNORMAL LOW (ref 8.9–10.3)
GFR calc non Af Amer: 52 mL/min — ABNORMAL LOW (ref >60)
GFR, EST AFRICAN AMERICAN: 60 mL/min — AB (ref >60)
GLUCOSE: 193 mg/dL — AB (ref 65–99)
Potassium: 4.4 mmol/L (ref 3.5–5.1)
Sodium: 136 mmol/L (ref 135–145)

## 2015-10-19 LAB — GLUCOSE, CAPILLARY
Glucose-Capillary: 162 mg/dL — ABNORMAL HIGH (ref 65–99)
Glucose-Capillary: 185 mg/dL — ABNORMAL HIGH (ref 65–99)

## 2015-10-19 LAB — URINALYSIS, ROUTINE W REFLEX MICROSCOPIC
Glucose, UA: NEGATIVE mg/dL
Ketones, ur: 40 mg/dL — AB
NITRITE: POSITIVE — AB
PROTEIN: 100 mg/dL — AB
SPECIFIC GRAVITY, URINE: 1.02 (ref 1.005–1.030)
pH: 5 (ref 5.0–8.0)

## 2015-10-19 LAB — URINE MICROSCOPIC-ADD ON

## 2015-10-19 LAB — HEPARIN LEVEL (UNFRACTIONATED)

## 2015-10-19 LAB — PROTIME-INR
INR: 2.1 — AB (ref 0.00–1.49)
Prothrombin Time: 23.4 seconds — ABNORMAL HIGH (ref 11.6–15.2)

## 2015-10-19 MED ORDER — HEPARIN (PORCINE) IN NACL 100-0.45 UNIT/ML-% IJ SOLN
1750.0000 [IU]/h | INTRAMUSCULAR | Status: DC
  Administered 2015-10-19: 1300 [IU]/h via INTRAVENOUS
  Administered 2015-10-20: 1600 [IU]/h via INTRAVENOUS
  Administered 2015-10-23: 1750 [IU]/h via INTRAVENOUS
  Filled 2015-10-19 (×6): qty 250

## 2015-10-19 NOTE — Progress Notes (Addendum)
Heparin IV orderd for pt. Pt has punch colored urine. Dr Curly Shores made aware. Awaiting further orders... U/a sent as ordered

## 2015-10-19 NOTE — Progress Notes (Signed)
ANTICOAGULATION CONSULT NOTE - Follow Up Consult  Pharmacy Consult for Heparin (while warfarin on hold) Indication: atrial fibrillation  No Known Allergies  Patient Measurements: Height: 5\' 10"  (177.8 cm) Weight: 220 lb 12.8 oz (100.154 kg) (scale b) IBW/kg (Calculated) : 73  Vital Signs: Temp: 97.8 F (36.6 C) (03/16 2203) Temp Source: Oral (03/16 2203) BP: 106/65 mmHg (03/16 2203) Pulse Rate: 79 (03/16 2203)  Labs:  Recent Labs  10/17/15 2057 10/18/15 0225 10/18/15 0530 10/18/15 0815 10/18/15 1354 10/19/15 0440 10/19/15 2254  HGB 9.5*  --  9.5*  --   --  9.5*  --   HCT 29.4*  --  28.0*  --   --  30.2*  --   PLT 212  --  185  --   --  208  --   LABPROT 52.4*  --  46.5*  --   --  23.4*  --   INR 6.13*  --  5.23*  --   --  2.10*  --   HEPARINUNFRC  --   --   --   --   --   --  <0.10*  CREATININE 1.33*  --  1.23  --   --  1.24  --   TROPONINI  --  0.55*  --  0.41* 0.46*  --   --    Estimated Creatinine Clearance: 52.6 mL/min (by C-G formula based on Cr of 1.24).  Assessment: Initial heparin level is sub-therapeutic, no issues per RN, DCCV 3/17  Goal of Therapy:  Heparin level 0.3-0.7 units/ml Monitor platelets by anticoagulation protocol: Yes   Plan:  -Increase heparin to 1600 units/hr -0800 HL  Narda Bonds 10/19/2015,11:53 PM

## 2015-10-19 NOTE — Progress Notes (Signed)
ANTICOAGULATION CONSULT NOTE - Initial Consult  Pharmacy Consult for heparin  Indication: atrial fibrillation  No Known Allergies  Patient Measurements: Height: 5\' 10"  (177.8 cm) Weight: 220 lb 12.8 oz (100.154 kg) (scale b) IBW/kg (Calculated) : 73 Heparin Dosing Weight: 93  Vital Signs: Temp: 97.7 F (36.5 C) (03/16 0421) Temp Source: Oral (03/16 0421) BP: 103/71 mmHg (03/16 1132) Pulse Rate: 85 (03/16 1132)  Labs:  Recent Labs  10/17/15 2057 10/18/15 0225 10/18/15 0530 10/18/15 0815 10/18/15 1354 10/19/15 0440  HGB 9.5*  --  9.5*  --   --  9.5*  HCT 29.4*  --  28.0*  --   --  30.2*  PLT 212  --  185  --   --  208  LABPROT 52.4*  --  46.5*  --   --  23.4*  INR 6.13*  --  5.23*  --   --  2.10*  CREATININE 1.33*  --  1.23  --   --  1.24  TROPONINI  --  0.55*  --  0.41* 0.46*  --     Estimated Creatinine Clearance: 52.6 mL/min (by C-G formula based on Cr of 1.24).  Assessment: 13 yoM on warfarin PTA for afib admitted with acute on chronic diastolic CHF. Planning for DCCV on 3/17. INR 6.13 on admission, now 2.1 s/p 5 mg PO vitamin K. Warfarin on hold with plans to transition to heparin gtt prior to DCCV. CBC stable with no sxs of bleeding.   Goal of Therapy:  INR 2-3 Heparin level 0.3-0.7 units/ml Monitor platelets by anticoagulation protocol: Yes   Plan:  1. Begin heparin at 1300 units/hr 2. HL in 8 hours and daily 3. INR in am 4. F/u on long term anticoagulation plans post DCCV   Vincenza Hews, PharmD, BCPS 10/19/2015, 1:37 PM Pager: [phone]

## 2015-10-19 NOTE — Progress Notes (Addendum)
[address]       [place] [address]             [phone]             Subjective: Patient states he is feeling ok this am.  Objective: Vital signs in last 24 hours: Temp:  [97 F (36.1 C)-97.7 F (36.5 C)] 97.7 F (36.5 C) (03/16 0421) Pulse Rate:  [76-88] 84 (03/16 0421) Cardiac Rhythm:  [-] Atrial fibrillation (03/15 1900) Resp:  [18-22] 20 (03/16 0421) BP: (99-120)/(53-62) 120/56 mmHg (03/16 0421) SpO2:  [96 %-100 %] 98 % (03/16 0421) Weight:  [220 lb 12.8 oz (100.154 kg)] 220 lb 12.8 oz (100.154 kg) (03/16 0421)  Current Weight  10/19/15 220 lb 12.8 oz (100.154 kg)       Intake/Output from previous day: 03/15 0701 - 03/16 0700 In: 1273 [P.O.:720; I.V.:3; IV Piggyback:550] Out: 1525 [Urine:1525]   Physical Exam:  Cardiovascular: RRR, murmur  Pulmonary: Diminished right base Abdomen: Soft, non tender, bowel sounds present. Extremities: +++ bilateral lower extremity edema. Wounds: Clean and dry.  No erythema or signs of infection.  Lab Results: CBC:  Recent Labs  10/18/15 0530 10/19/15 0440  WBC 14.1* 12.9*  HGB 9.5* 9.5*  HCT 28.0* 30.2*  PLT 185 208   BMET:   Recent Labs  10/18/15 0530 10/19/15 0440  NA 136 136  K 3.5 4.4  CL 99* 101  CO2 25 25  GLUCOSE 194* 193*  BUN 37* 38*  CREATININE 1.23 1.24  CALCIUM 8.3* 8.3*    PT/INR:  Lab Results  Component Value Date   INR 2.10* 10/19/2015   INR 5.23* 10/18/2015   INR 6.13* 10/17/2015   ABG:  INR: Will add last result for INR, ABG once components are confirmed Will add last 4 CBG results once components are confirmed  Assessment/Plan:  1. CV - S/p mini MV repair on 10/04/2015. A fib.On Amiodarone 200 mg daily and Lopressor 25 mg bid. Cardiology following. 2.  Previous AMS-AAOx 3 this am.CT head showed 14 mm RIGHT holo hemispheric chronic subdural hematoma without midline shift (increased from 11 mm in '14) 3. Volume Overload - On 40 mg IV Lasix  4.   Normocytic anemia - H and H 9.5 and 28 5. Elevated transaminases, alk phos, and bilirubin. Abd US showed stones and sludge within GB and GB wall thickening, possible mild hepatic cirrhosis, minimal ascites. 6. Coagulopathy-INR was 6.13 in ED. Coumadin on hold. INR now down to 2.10 7. Leukocytosis (WBC decreased to 12,900) with NO fever. CXR this am shows right basilar opacity (questionable PNA) with right pleural effusion. 8. GU-history of prostate cancer.  On Proscar 5 mg daily and Flomax 0.4 mg daily. Bloody urine in foley bag. Recommend removing foley as soon as possible  [name],DONIELLE MPA-C 10/19/2015,8:56 AM  I have seen and examined the patient and agree with the assessment and plan as outlined.  Please see my note from yesterday.  No significant changes.  I favor removing Foley catheter as soon as possible.  Clarence H Owen, MD 10/19/2015 3:26 PM    

## 2015-10-19 NOTE — Evaluation (Signed)
Physical Therapy Evaluation Patient Details Name: [name] MRN: [id number] DOB: 12/08/1928 Today's Date: 10/19/2015   History of Present Illness  Mr. Dockett is an 82 yo white male with chronic diastolic CHF, coronary artery disease , recurrent SVT s/p DCCV, pulmonary hypertension, type 2 diabetes mellitus, hyperlipidemia, and chronic back pain who recently underwent minimally invasive Mitral Valve Repair 10/04/15 presented with SOB and LE edema 10/17/15, also increased INR and inability to ambulate at home.  Clinical Impression  Pt admitted with above diagnosis. Pt currently with functional limitations due to the deficits listed below (see PT Problem List). Pt will benefit from skilled PT to increase their independence and safety with mobility to allow discharge to the venue listed below.    The patient is very weak and deconditioned. He states that he does not want post acute rehab.  Will continue PT while in acute care.     Follow Up Recommendations SNF;Supervision/Assistance - 24 hour (patient declines that he will go but  patiient is much weaker and was limited at home after DC  3/9.)    Equipment Recommendations    none   Recommendations for Other Services       Precautions / Restrictions Precautions Precautions: Fall;Sternal Restrictions Weight Bearing Restrictions: No      Mobility  Bed Mobility               General bed mobility comments: sitting on bed edge  Transfers Overall transfer level: Needs assistance   Transfers: Sit to/from Stand;Stand Pivot Transfers Sit to Stand: Min assist Stand pivot transfers: Min assist       General transfer comment: Min A to boost from elevated bed height with cues , pt using UEs to stand, used rail to and [facility] armrrest  while standing, pivot to [facility] using the bed rail and armrest with min assist. Stood from [facility] with a boost. Stood for [employer] while  supporting wqith UE's on bed rail, then reversed to transfer to bed.  Recliner set up and patient stood and turned to pivot using rail and armrest again. declined to ambulate.  Ambulation/Gait                Stairs            Wheelchair Mobility    Modified Rankin (Stroke Patients Only)       Balance   Sitting-balance support: Feet supported;No upper extremity supported Sitting balance-Leahy Scale: Fair     Standing balance support: During functional activity;Bilateral upper extremity supported Standing balance-Leahy Scale: Fair Standing balance comment: relies on UE support                             Pertinent Vitals/Pain Pain Assessment: No/denies pain    Home Living Family/patient expects to be discharged to:: Private residence Living Arrangements: Spouse/significant other Available Help at Discharge: Family;Available 24 hours/day Type of Home: House Home Access: Ramped entrance     Home Layout: Two level Home Equipment: Walker - 2 wheels;Cane - single point;Bedside commode;Other (comment);Shower seat      Prior Function           Comments: has been limited  at home since DC after MV surgery. per daughters, declining in mobility.     Hand Dominance   Dominant Hand: Right    Extremity/Trunk Assessment   Upper Extremity Assessment: Generalized weakness           Lower Extremity Assessment:  Generalized weakness;RLE deficits/detail;LLE deficits/detail RLE Deficits / Details: leg edema LLE Deficits / Details: edema  Cervical / Trunk Assessment: Normal  Communication   Communication: No difficulties  Cognition Arousal/Alertness: Awake/alert Behavior During Therapy: WFL for tasks assessed/performed Overall Cognitive Status: Within Functional Limits for tasks assessed                      General Comments      Exercises        Assessment/Plan    PT Assessment Patient needs continued PT services  PT Diagnosis Generalized weakness;Difficulty walking   PT Problem List  Decreased strength;Cardiopulmonary status limiting activity;Decreased activity tolerance;Decreased balance;Decreased mobility;Decreased knowledge of precautions  PT Treatment Interventions Gait training;Stair training;Functional mobility training;Therapeutic activities;Therapeutic exercise;Patient/family education   PT Goals (Current goals can be found in the Care Plan section) Acute Rehab PT Goals Patient Stated Goal: to return home and to independence PT Goal Formulation: With patient/family Time For Goal Achievement: 11/02/15 Potential to Achieve Goals: Fair    Frequency Min 3X/week   Barriers to discharge        Co-evaluation               End of Session   Activity Tolerance: Patient limited by fatigue Patient left: in chair;with chair alarm set;with call bell/phone within reach;with family/visitor present Nurse Communication: Mobility status         Time: 1010-1048 PT Time Calculation (min) (ACUTE ONLY): 38 min   Charges:   PT Evaluation $PT Eval Low Complexity: 1 Procedure PT Treatments $Therapeutic Activity: 8-22 mins $Self Care/Home Management: 8-22   PT G Codes:        Claretha Cooper 10/19/2015, 11:01 AM

## 2015-10-19 NOTE — Progress Notes (Signed)
Utilization review completed. Heinrich Fertig, RN, BSN. 

## 2015-10-19 NOTE — Progress Notes (Addendum)
INR of 2.1 today. Dr. Meda Coffee want to start on IV heparin once INR less than 2. Check LFT in AM, if normal restart coumadin. Nurse reported hematuria today. UA was clear yesterday. On Ceftriaxone and Vancomycin for pneumonia. Will cover UTI as well. Continues to have hematuria--> consider renal consult. Also decreased urine output.   Sherri Mcarthy, [place]

## 2015-10-19 NOTE — Progress Notes (Signed)
Patient Name: [name] Date of Encounter: 10/19/2015  SUBJECTIVE  Denies chest pain.feels SOB, hasn't start walking yet. Seems very deconditioned.  CURRENT MEDS . amiodarone  200 mg Oral Daily  . aspirin EC  81 mg Oral Daily  . cefTRIAXone (ROCEPHIN)  IV  2 g Intravenous Q24H  . feeding supplement (ENSURE ENLIVE)  237 mL Oral BID BM  . finasteride  5 mg Oral Daily  . furosemide  40 mg Intravenous BID  . metoprolol tartrate  50 mg Oral BID  . multivitamin with minerals  1 tablet Oral Daily  . pravastatin  40 mg Oral q1800  . sodium chloride flush  3 mL Intravenous Q12H  . tamsulosin  0.4 mg Oral Daily  . vancomycin  1,250 mg Intravenous Q24H    OBJECTIVE  Filed Vitals:   10/18/15 2147 10/19/15 0003 10/19/15 0421 10/19/15 1132  BP: 109/62 107/60 120/56 103/71  Pulse: 77 76 84 85  Temp: 97.5 F (36.4 C) 97.3 F (36.3 C) 97.7 F (36.5 C)   TempSrc: Oral Oral Oral   Resp: _0 Height:      Weight:   220 lb 12.8 oz (100.154 kg)   SpO2: 97% 96% 98%     Intake/Output Summary (Last 24 hours) at 10/19/15 1154 Last data filed at 10/19/15 0840  Gross per 24 hour  Intake   1390 ml  Output   1275 ml  Net    115 ml   Filed Weights   10/17/15 2355 10/18/15 0211 10/19/15 0421  Weight: 220 lb 11.2 oz (100.109 kg) 220 lb 11.2 oz (100.109 kg) 220 lb 12.8 oz (100.154 kg)    PHYSICAL EXAM  General: Pleasant, NAD. Somewhat confused however answers questions.   Neuro: Alert and oriented X 3. Moves all extremities spontaneously. Psych: Somewhat consued HEENT:  Normal  Neck: Supple without bruits or JVD. Lungs:  Resp regular and unlabored. Bibasilar crackles.  Heart: ir ir with distance heart sound. 3/6 holosystolic murmur.  Abdomen: Soft, non-tender, non-distended, BS + x 4.  Extremities: No clubbing, cyanosis. 2+ BL LE edema. DP 1+ and equal bilaterally.  Accessory Clinical Findings  CBC  Recent Labs  10/17/15 2057 10/18/15 0530 10/19/15 0440  WBC 16.8*  14.1* 12.9*  NEUTROABS 13.7*  --   --   HGB 9.5* 9.5* 9.5*  HCT 29.4* 28.0* 30.2*  MCV 92.5 92.4 93.5  PLT 212 185 637   Basic Metabolic Panel  Recent Labs  10/18/15 0530 10/19/15 0440  NA 136 136  K 3.5 4.4  CL 99* 101  CO2 25 25  GLUCOSE 194* 193*  BUN 37* 38*  CREATININE 1.23 1.24  CALCIUM 8.3* 8.3*   Liver Function Tests  Recent Labs  10/17/15 2308 10/18/15 0530  AST 85* 91*  ALT 77* 74*  ALKPHOS 621* 630*  BILITOT 3.2* 3.3*  PROT 5.5* 5.2*  ALBUMIN 2.0* 1.9*    Recent Labs  10/18/15 0530  LIPASE 33   Cardiac Enzymes  Recent Labs  10/18/15 0225 10/18/15 0815 10/18/15 1354  TROPONINI 0.55* 0.41* 0.46*   TELE  afib at rate of 80s  Radiology/Studies  Dg Chest 2 View  10/19/2015  CLINICAL DATA:  Shortness of breath. EXAM: CHEST  2 VIEW COMPARISON:  October 17, 2015. FINDINGS: Stable cardiomegaly. No pneumothorax is noted. Left lung is clear. Stable right basilar opacity is noted concerning for edema, pneumonia or atelectasis with moderate associated pleural effusion. Bony thorax is unremarkable. IMPRESSION: Stable  right basilar opacity concerning for pneumonia, atelectasis or edema with associated pleural effusion. Electronically Signed   By: Marijo Conception, M.D.   On: 10/19/2015 08:10   Echo 10/18/15 LV EF: 55% - 60%  ------------------------------------------------------------------- Indications: MVD [non-rheumatic] 424.0.  ------------------------------------------------------------------- History: PMH: Lower Extremity Edema. Dyspnea. Coronary artery disease. Primary pulmonary hypertension. Risk factors: Hypertension. Diabetes mellitus. Dyslipidemia.  ------------------------------------------------------------------- Study Conclusions  - Left ventricle: The cavity size was normal. There was severe  focla hypertrophy of the basal septum and mild hypertrophy of the  posterior wall. Systolic function was normal. The  estimated  ejection fraction was in the range of 55% to 60%. The study is  not technically sufficient to allow evaluation of LV diastolic  function. - Regional wall motion abnormality: Hypokinesis of the basal-mid  anteroseptal and basal-mid inferoseptal myocardium. - Aortic valve: There was mild stenosis. There was trivial  regurgitation. Peak velocity (S): 284 cm/s. Mean gradient (S): 16  mm Hg. - Mitral valve: Prior procedures included surgical repair with a 30  mm Sorin Memo 3D annuloplasty ring. The sewing ring had no  rocking motion and showed no evidence of dehiscence. The findings  are consistent with moderate stenosis. Mean gradient (D): 6 mm  Hg. Gradient relatively unchanged from prior. - Left atrium: The atrium was severely dilated. - Right ventricle: Systolic function was mildly reduced. - Right atrium: The atrium was moderately dilated. - Atrial septum: No defect or patent foramen ovale was identified  by color flow Doppler. - Tricuspid valve: There was mild-moderate regurgitation. - Pulmonic valve: There was mild regurgitation. - Pulmonary arteries: Systolic pressure was moderately to severely  increased. PA peak pressure: 47 mm Hg (S). - Inferior vena cava: The vessel was dilated. The respirophasic  diameter changes were blunted (< 50%), consistent with elevated  central venous pressure. - Pericardium, extracardiac: A trivial pericardial effusion was  identified.   ASSESSMENT AND PLAN  1. Acute on chronic diastolic CHF - significant - however tricky situation, not good response to iv lasix, soft BP and LVOT obstruction, we don't want to replete intravascular volume. He might benefit from DCCV, we will arrange for tomorrow.  2. Altered mental status.  Given INR >6, concern for possible intracranial bleed, CT head showed 14 mm RIGHT holo hemispheric chronic subdural hematoma without midline shift.-- check ammonia level was 24 (WNL). Consider neurology  consult. INR now 2  3. Elevated LFTs and coagulopathy. Likely due to hepatic congestion, although more of an obstructive pattern, with alk phos and t bili elevated and minimal transaminitis. Less likely due to amiodarone, which was recently initiated. -- Abdominal ultrasound showed stones and sludge within GB and GB wall thickening, possible mild hepatic cirrhosis, minimal ascites.  -- Pending CT of abdomen -- Held Coumadin -- given Vitamin K, even though his INR is < 10 and he has no overt bleeding, he is at greater risk of bleeding (age, decompensated heart failure, and expanding chronic subdural hematoma on head CT)   4. Atrial fibrillation. Currently rate-controlled -- Continue metoprolol and amiodarone -- Held Coumadin  5. Coronary artery disease s/p DES to LAD and RCA in 4/16. EKG without acute ischemic changes. Troponin is mildly elevated (0.55-->0.41), but this is likely demand in the setting of volume overload. Was not discharged on DAPT, but he is almost 1 year out from intervention, so low risk for stent thrombosis.  -- Continue aspirin 81 mg, as there is no overt signs of bleeding. -- Continue pravastatin (unclear why he  is not on a high-potency statin, but this can be addressed by his primary cardiologist)  6. Leukocytosis. Normal differential. No other objective evidence of infection, likely reactive in setting of acute illness. Surgical incisions appear to be healing well. If spikes a fever, will send cultures. UA clear.   7. Normocytic anemia. No significant difference from baseline while in the hospital, will monitor closely in the setting of elevated INR  8. Poor PO intake. Likely due to volume overload. Will reassess and consider nutrition consult if no improvement with diuresis.  Dorothy Spark 10/19/2015

## 2015-10-19 NOTE — Progress Notes (Signed)
[facility]  Patient Status: Active (receiving services up to time of hospitalization)  [facility] is providing the following services: RN, PT, OT and HHA  If patient discharges after hours, please call [phone].   [name] 10/19/2015, 5:56 PM

## 2015-10-20 ENCOUNTER — Encounter (HOSPITAL_COMMUNITY): Payer: Self-pay

## 2015-10-20 ENCOUNTER — Encounter (HOSPITAL_COMMUNITY)
Admission: EM | Disposition: A | Discharge status: Skilled Nursing Facility | Payer: Self-pay | Source: Home / Self Care | Attending: Cardiology

## 2015-10-20 ENCOUNTER — Inpatient Hospital Stay (HOSPITAL_COMMUNITY): Payer: Medicare Other | Admitting: Anesthesiology

## 2015-10-20 HISTORY — PX: CARDIOVERSION: SHX1299

## 2015-10-20 LAB — CBC
HCT: 26.8 % — ABNORMAL LOW (ref 39.0–52.0)
Hemoglobin: 8.5 g/dL — ABNORMAL LOW (ref 13.0–17.0)
MCH: 29.6 pg (ref 26.0–34.0)
MCHC: 31.7 g/dL (ref 30.0–36.0)
MCV: 93.4 fL (ref 78.0–100.0)
PLATELETS: 186 10*3/uL (ref 150–400)
RBC: 2.87 MIL/uL — AB (ref 4.22–5.81)
RDW: 15.2 % (ref 11.5–15.5)
WBC: 10.7 10*3/uL — AB (ref 4.0–10.5)

## 2015-10-20 LAB — HEPATIC FUNCTION PANEL
ALK PHOS: 575 U/L — AB (ref 38–126)
ALT: 71 U/L — AB (ref 17–63)
AST: 74 U/L — AB (ref 15–41)
Albumin: 1.8 g/dL — ABNORMAL LOW (ref 3.5–5.0)
BILIRUBIN DIRECT: 0.9 mg/dL — AB (ref 0.1–0.5)
BILIRUBIN TOTAL: 1.7 mg/dL — AB (ref 0.3–1.2)
Indirect Bilirubin: 0.8 mg/dL (ref 0.3–0.9)
TOTAL PROTEIN: 5.2 g/dL — AB (ref 6.5–8.1)

## 2015-10-20 LAB — BASIC METABOLIC PANEL
Anion gap: 7 (ref 5–15)
BUN: 32 mg/dL — ABNORMAL HIGH (ref 6–20)
CALCIUM: 8.2 mg/dL — AB (ref 8.9–10.3)
CO2: 27 mmol/L (ref 22–32)
CREATININE: 1.12 mg/dL (ref 0.61–1.24)
Chloride: 102 mmol/L (ref 101–111)
GFR calc non Af Amer: 58 mL/min — ABNORMAL LOW (ref >60)
Glucose, Bld: 155 mg/dL — ABNORMAL HIGH (ref 65–99)
Potassium: 4 mmol/L (ref 3.5–5.1)
SODIUM: 136 mmol/L (ref 135–145)

## 2015-10-20 LAB — HEMOGLOBIN AND HEMATOCRIT, BLOOD
HEMATOCRIT: 26.5 % — AB (ref 39.0–52.0)
HEMOGLOBIN: 8.3 g/dL — AB (ref 13.0–17.0)

## 2015-10-20 LAB — PROTIME-INR
INR: 1.68 — ABNORMAL HIGH (ref 0.00–1.49)
PROTHROMBIN TIME: 19.8 s — AB (ref 11.6–15.2)

## 2015-10-20 LAB — GLUCOSE, CAPILLARY: Glucose-Capillary: 114 mg/dL — ABNORMAL HIGH (ref 65–99)

## 2015-10-20 LAB — HEPARIN LEVEL (UNFRACTIONATED)
Heparin Unfractionated: 0.66 IU/mL (ref 0.30–0.70)
Heparin Unfractionated: 0.27 IU/mL — ABNORMAL LOW (ref 0.30–0.70)

## 2015-10-20 SURGERY — CARDIOVERSION
Anesthesia: General

## 2015-10-20 MED ORDER — PHENYLEPHRINE HCL 10 MG/ML IJ SOLN
INTRAMUSCULAR | Status: DC | PRN
  Administered 2015-10-20: 120 ug via INTRAVENOUS
  Administered 2015-10-20: 40 ug via INTRAVENOUS
  Administered 2015-10-20: 120 ug via INTRAVENOUS
  Administered 2015-10-20: 40 ug via INTRAVENOUS

## 2015-10-20 MED ORDER — PROPOFOL 10 MG/ML IV BOLUS
INTRAVENOUS | Status: DC | PRN
  Administered 2015-10-20: 50 mg via INTRAVENOUS

## 2015-10-20 MED ORDER — SODIUM CHLORIDE 0.9 % IV BOLUS (SEPSIS)
500.0000 mL | Freq: Once | INTRAVENOUS | Status: AC
  Administered 2015-10-20: 500 mL via INTRAVENOUS

## 2015-10-20 MED ORDER — EPHEDRINE SULFATE 50 MG/ML IJ SOLN
INTRAMUSCULAR | Status: DC | PRN
  Administered 2015-10-20: 5 mg via INTRAVENOUS

## 2015-10-20 NOTE — Progress Notes (Signed)
[place] for heparin  Indication: atrial fibrillation  No Known Allergies  Patient Measurements: Height: 5\' 10"  (177.8 cm) Weight: 221 lb 3.2 oz (100.336 kg) IBW/kg (Calculated) : 73 Heparin Dosing Weight: 93  Vital Signs: Temp: 97.3 F (36.3 C) (03/17 0921) Temp Source: Oral (03/17 0921) BP: 114/59 mmHg (03/17 0921) Pulse Rate: 76 (03/17 0921)  Labs:  Recent Labs  10/18/15 0225 10/18/15 0530 10/18/15 0815 10/18/15 1354 10/19/15 0440 10/19/15 2254 10/20/15 0639  HGB  --  9.5*  --   --  9.5*  --  8.5*  HCT  --  28.0*  --   --  30.2*  --  26.8*  PLT  --  185  --   --  208  --  186  LABPROT  --  46.5*  --   --  23.4*  --  19.8*  INR  --  5.23*  --   --  2.10*  --  1.68*  HEPARINUNFRC  --   --   --   --   --  <0.10* 0.66  CREATININE  --  1.23  --   --  1.24  --  1.12  TROPONINI 0.55*  --  0.41* 0.46*  --   --   --     Estimated Creatinine Clearance: 58.3 mL/min (by C-G formula based on Cr of 1.12).  Assessment: 89 yoM on warfarin PTA for afib admitted with acute on chronic diastolic CHF. Planning for DCCV on 3/17. INR 6.13 on admission, now 1.68 s/p 5 mg PO vitamin K. Warfarin on hold with plans to and on heparin with plans for DCCV today.  -noted with hematuria 3/16 and CT with chronic subdural hematoma on 3/15 -heparin level= 0.66 -Hg= 8.5, plt= 186   Goal of Therapy:  Heparin level= 0.3-0.5 Monitor platelets by anticoagulation protocol: Yes   Plan:   -Decrease heparin to 1500 units/hr -Heparin level in 8 hours and daily wth CBC daily  Hildred Laser, Pharm D 10/20/2015 9:59 AM

## 2015-10-20 NOTE — Progress Notes (Signed)
[facility] BRIEF PROGRESS NOTE  Day of Surgery  S/P Procedure(s) (LRB): CARDIOVERSION (N/A)   Mr Kissler reports feeling much better since cardioversion.   He is maintaining NSR so far and reports breathing much improved  Plan: Will continue to follow  Rexene Alberts, MD 10/20/2015 5:44 PM

## 2015-10-20 NOTE — H&P (Signed)
     INTERVAL PROCEDURE H&P  History and Physical Interval Note:  10/20/2015 12:03 PM  [name] has presented today for their planned procedure. The various methods of treatment have been discussed with the patient and family. After consideration of risks, benefits and other options for treatment, the patient has consented to the procedure.  The patients' outpatient history has been reviewed, patient examined, and no change in status from most recent office note within the past 30 days. I have reviewed the patients' chart and labs and will proceed as planned. Questions were answered to the patient's satisfaction.   Pixie Casino, MD, [facility] Attending Cardiologist [place] C Sheleen Conchas 10/20/2015, 12:03 PM

## 2015-10-20 NOTE — Transfer of Care (Signed)
Immediate Anesthesia Transfer of Care Note  Patient: [name]  Procedure(s) Performed: Procedure(s): CARDIOVERSION (N/A)  Patient Location: PACU  Anesthesia Type:General  Level of Consciousness: awake, alert  and oriented  Airway & Oxygen Therapy: Patient Spontanous Breathing and Patient connected to nasal cannula oxygen  Post-op Assessment: Report given to RN, Post -op Vital signs reviewed and stable and Patient moving all extremities X 4  Post vital signs: Reviewed and stable  Last Vitals:  Filed Vitals:   10/20/15 0921 10/20/15 1116  BP: 114/59 112/67  Pulse: 76 71  Temp: 36.3 C 36.4 C  Resp:  19   BP 93/60, HR 61, RR 14, Sats 0000000 Complications: No apparent anesthesia complications

## 2015-10-20 NOTE — Progress Notes (Signed)
CSW went to speak with patient to assess regarding PT recommendation, however patient is currently in Endo. CSW will follow up at a later time.   Lucius Conn, [facility] Worker [facility] Ph: [phone]

## 2015-10-20 NOTE — Consult Note (Signed)
Urology Consult  Referring physician:  Alma Friendly, MD Reason for referral:  Foley catheter care  Chief Complaint: Foley catheter care. Possible removal,   History of Present Illness:   [name] is an 82 yo male, seen 2014 by Dr. Tresa Moore for elevated PSA who refused further evaluation, and failed follow-up. He now is post Mitral valve surgery and recent cardioversion for Atrial Fibrillation. He has foley catheter, and I have been asked to see patient with respect to foley catheter removal.   Past Medical History  Diagnosis Date  . Coronary artery disease     a. 11/2014 NSTEMI: DESx 2 placed to LAD and RCA   . Mitral valve prolapse   . Diabetes mellitus, type II ([place])     a. HgA1c 6.8 in 03/2015  . Mitral regurgitation     a. severe wtih flail leaflet by 2D ECHO (06/05/15)  . SVT (supraventricular tachycardia) (HCC)     a. recurrent, usually responsive to vagal manuevers  . HTN (hypertension)   . Pulmonary HTN ([place])   . Lower extremity edema     a. chronic  . HLD (hyperlipidemia)   . Hypertension   . OA (osteoarthritis)   . ED (erectile dysfunction)   . Chronic fatigue   . Gout   . Spinal stenosis   . Prostate cancer ([place])   . Severe mitral regurgitation 11/17/2014    Mitral valve prolapse with flail segment of anterior leaflet   . Chronic diastolic (congestive) heart failure ([place])   . Hemangioma of liver 08/02/2015    12 mm enhancing lesion seen on CT - suspicious for benign hemangioma but not diagnostic - MRI recommended  . Dysrhythmia   . Heart murmur   . Shortness of breath dyspnea   . Kidney stones   . Physical deconditioning   . S/P minimally invasive mitral valve repair 10/04/2015    Complex valvuloplasty including artificial Gore-tex neochord placement x6 with plication closure of cleft between P2 and P3 and 30 mm Sorin Memo 3D ring annuloplasty via right mini thoracotomy approach   . Hypertensive left ventricular hypertrophy with heart failure ([place]) 10/04/2015  .  Hypertensive cardiomyopathy [facility])    Past Surgical History  Procedure Laterality Date  . Cardiac surgery      2 cardiac stents  . Joint replacement      shoulder  . Back surgery x 2    . Rt rotator cuff repair    . Cervical spine surgery    . Back surgery    . Left heart catheterization with coronary angiogram N/A 11/16/2014    Procedure: LEFT HEART CATHETERIZATION WITH CORONARY ANGIOGRAM;  Surgeon: Troy Sine, MD;  Location: [facility] CATH LAB;  Service: Cardiovascular;  Laterality: N/A;  . Tee without cardioversion N/A 06/22/2015    Procedure: TRANSESOPHAGEAL ECHOCARDIOGRAM (TEE);  Surgeon: Skeet Latch, MD;  Location: [place];  Service: Cardiovascular;  Laterality: N/A;  . Cardiac catheterization N/A 08/08/2015    Procedure: Right/Left Heart Cath and Coronary Angiography;  Surgeon: Sherren Mocha, MD;  Location: [place] CV LAB;  Service: Cardiovascular;  Laterality: N/A;  . Eye surgery    . Mitral valve repair Right 10/04/2015    Procedure: MINIMALLY INVASIVE MITRAL VALVE REPAIR (MVR);  Surgeon: Rexene Alberts, MD;  Location: [place];  Service: Open Heart Surgery;  Laterality: Right;  . Tee without cardioversion N/A 10/04/2015    Procedure: TRANSESOPHAGEAL ECHOCARDIOGRAM (TEE);  Surgeon: Rexene Alberts, MD;  Location: [place];  Service: Open  Heart Surgery;  Laterality: N/A;  . Wound exploration N/A 10/05/2015    Procedure: Sternal Incision;  Surgeon: Rexene Alberts, MD;  Location: [place];  Service: Open Heart Surgery;  Laterality: N/A;  . Intraoperative transesophageal echocardiogram  10/05/2015    Procedure: INTRAOPERATIVE TRANSESOPHAGEAL ECHOCARDIOGRAM;  Surgeon: Rexene Alberts, MD;  Location[facility];  Service: Open Heart Surgery;;    Medications: I have reviewed the patient's current medications. Allergies: No Known Allergies  Family History  Problem Relation Age of Onset  . Heart failure Mother 54  . Heart attack Mother   . Heart attack Brother    Social History:  reports  that he has quit smoking. He has never used smokeless tobacco. He reports that he does not drink alcohol or use illicit drugs.  ROS: All systems are reviewed and negative except as noted. Pt denies voiding difficulty.   Physical Exam:  Vital signs in last 24 hours: Temp:  [97.3 F (36.3 C)-98.5 F (36.9 C)] 97.9 F (36.6 C) (03/17 1602) Pulse Rate:  [54-79] 66 (03/17 1602) Resp:  [13-22] 17 (03/17 1602) BP: (87-118)/(54-88) 109/59 mmHg (03/17 1602) SpO2:  [93 %-100 %] 100 % (03/17 1602) Weight:  [100.336 kg (221 lb 3.2 oz)] 100.336 kg (221 lb 3.2 oz) (03/17 1610)  Cardiovascular: Skin warm; not flushed Respiratory: Breaths quiet; no shortness of breath. Edentulous.  Abdomen: No masses Neurological: Normal sensation to touch Musculoskeletal: Normal motor function arms and legs Lymphatics: No inguinal adenopathy Skin: No rashes Genitourinary: normal penis. Foley cath in place. Pt refuses rectal exam.   Laboratory Data:  Results for orders placed or performed during the hospital encounter of 10/17/15 (from the past 72 hour(s))  I-stat troponin, ED     Status: Abnormal   Collection Time: 10/17/15  8:51 PM  Result Value Ref Range   Troponin i, poc 0.44 (HH) 0.00 - 0.08 ng/mL   Comment NOTIFIED PHYSICIAN    Comment 3            Comment: Due to the release kinetics of cTnI, a negative result within the first hours of the onset of symptoms does not rule out myocardial infarction with certainty. If myocardial infarction is still suspected, repeat the test at appropriate intervals.   Basic metabolic panel     Status: Abnormal   Collection Time: 10/17/15  8:57 PM  Result Value Ref Range   Sodium 138 135 - 145 mmol/L   Potassium 4.7 3.5 - 5.1 mmol/L   Chloride 101 101 - 111 mmol/L   CO2 27 22 - 32 mmol/L   Glucose, Bld 141 (H) 65 - 99 mg/dL   BUN 44 (H) 6 - 20 mg/dL   Creatinine, Ser 1.33 (H) 0.61 - 1.24 mg/dL   Calcium 8.6 (L) 8.9 - 10.3 mg/dL   GFR calc non Af Amer 47 (L)  >60 mL/min   GFR calc Af Amer 55 (L) >60 mL/min    Comment: (NOTE) The eGFR has been calculated using the CKD EPI equation. This calculation has not been validated in all clinical situations. eGFR's persistently <60 mL/min signify possible Chronic Kidney Disease.    Anion gap 10 5 - 15  CBC with Differential     Status: Abnormal   Collection Time: 10/17/15  8:57 PM  Result Value Ref Range   WBC 16.8 (H) 4.0 - 10.5 K/uL   RBC 3.18 (L) 4.22 - 5.81 MIL/uL   Hemoglobin 9.5 (L) 13.0 - 17.0 g/dL   HCT 29.4 (L)  39.0 - 52.0 %   MCV 92.5 78.0 - 100.0 fL   MCH 29.9 26.0 - 34.0 pg   MCHC 32.3 30.0 - 36.0 g/dL   RDW 15.0 11.5 - 15.5 %   Platelets 212 150 - 400 K/uL   Neutrophils Relative % 82 %   Neutro Abs 13.7 (H) 1.7 - 7.7 K/uL   Lymphocytes Relative 8 %   Lymphs Abs 1.4 0.7 - 4.0 K/uL   Monocytes Relative 10 %   Monocytes Absolute 1.7 (H) 0.1 - 1.0 K/uL   Eosinophils Relative 0 %   Eosinophils Absolute 0.0 0.0 - 0.7 K/uL   Basophils Relative 0 %   Basophils Absolute 0.0 0.0 - 0.1 K/uL  Brain natriuretic peptide     Status: Abnormal   Collection Time: 10/17/15  8:57 PM  Result Value Ref Range   B Natriuretic Peptide 763.6 (H) 0.0 - 100.0 pg/mL  Protime-INR     Status: Abnormal   Collection Time: 10/17/15  8:57 PM  Result Value Ref Range   Prothrombin Time 52.4 (H) 11.6 - 15.2 seconds    Comment: REPEATED TO VERIFY   INR 6.13 (HH) 0.00 - 1.49    Comment: REPEATED TO VERIFY CRITICAL RESULT CALLED TO, READ BACK BY AND VERIFIED WITH: HAUSAND,R RN [id number] 3.14.17 MCADOO,G   Hepatic function panel     Status: Abnormal   Collection Time: 10/17/15 11:08 PM  Result Value Ref Range   Total Protein 5.5 (L) 6.5 - 8.1 g/dL   Albumin 2.0 (L) 3.5 - 5.0 g/dL   AST 85 (H) 15 - 41 U/L   ALT 77 (H) 17 - 63 U/L   Alkaline Phosphatase 621 (H) 38 - 126 U/L   Total Bilirubin 3.2 (H) 0.3 - 1.2 mg/dL   Bilirubin, Direct 1.6 (H) 0.1 - 0.5 mg/dL   Indirect Bilirubin 1.6 (H) 0.3 - 0.9 mg/dL  Ammonia      Status: None   Collection Time: 10/18/15  2:20 AM  Result Value Ref Range   Ammonia 24 9 - 35 umol/L  Troponin I (q 6hr x 3)     Status: Abnormal   Collection Time: 10/18/15  2:25 AM  Result Value Ref Range   Troponin I 0.55 (HH) <0.031 ng/mL    Comment:        POSSIBLE MYOCARDIAL ISCHEMIA. SERIAL TESTING RECOMMENDED. CRITICAL RESULT CALLED TO, READ BACK BY AND VERIFIED WITH: Payton Spark [id number] WILDERK   Urinalysis, Routine w reflex microscopic (not at [facility])     Status: Abnormal   Collection Time: 10/18/15  3:29 AM  Result Value Ref Range   Color, Urine YELLOW YELLOW   APPearance CLEAR CLEAR   Specific Gravity, Urine 1.010 1.005 - 1.030   pH 5.0 5.0 - 8.0   Glucose, UA NEGATIVE NEGATIVE mg/dL   Hgb urine dipstick SMALL (A) NEGATIVE   Bilirubin Urine NEGATIVE NEGATIVE   Ketones, ur NEGATIVE NEGATIVE mg/dL   Protein, ur NEGATIVE NEGATIVE mg/dL   Nitrite NEGATIVE NEGATIVE   Leukocytes, UA NEGATIVE NEGATIVE  Urine microscopic-add on     Status: Abnormal   Collection Time: 10/18/15  3:29 AM  Result Value Ref Range   Squamous Epithelial / LPF 0-5 (A) NONE SEEN   WBC, UA 0-5 0 - 5 WBC/hpf   RBC / HPF 0-5 0 - 5 RBC/hpf   Bacteria, UA RARE (A) NONE SEEN  Protime-INR     Status: Abnormal   Collection Time: 10/18/15  5:30 AM  Result  Value Ref Range   Prothrombin Time 46.5 (H) 11.6 - 15.2 seconds   INR 5.23 (HH) 0.00 - 1.49    Comment: REPEATED TO VERIFY CRITICAL RESULT CALLED TO, READ BACK BY AND VERIFIED WITH: IASUL,G RN [id number] 3.15.17 MCADOO,G   Hepatic function panel     Status: Abnormal   Collection Time: 10/18/15  5:30 AM  Result Value Ref Range   Total Protein 5.2 (L) 6.5 - 8.1 g/dL   Albumin 1.9 (L) 3.5 - 5.0 g/dL   AST 91 (H) 15 - 41 U/L   ALT 74 (H) 17 - 63 U/L   Alkaline Phosphatase 630 (H) 38 - 126 U/L   Total Bilirubin 3.3 (H) 0.3 - 1.2 mg/dL   Bilirubin, Direct 1.7 (H) 0.1 - 0.5 mg/dL   Indirect Bilirubin 1.6 (H) 0.3 - 0.9 mg/dL  CBC     Status: Abnormal    Collection Time: 10/18/15  5:30 AM  Result Value Ref Range   WBC 14.1 (H) 4.0 - 10.5 K/uL   RBC 3.03 (L) 4.22 - 5.81 MIL/uL   Hemoglobin 9.5 (L) 13.0 - 17.0 g/dL   HCT 28.0 (L) 39.0 - 52.0 %   MCV 92.4 78.0 - 100.0 fL   MCH 31.4 26.0 - 34.0 pg   MCHC 33.9 30.0 - 36.0 g/dL   RDW 15.1 11.5 - 15.5 %   Platelets 185 150 - 400 K/uL  Basic metabolic panel     Status: Abnormal   Collection Time: 10/18/15  5:30 AM  Result Value Ref Range   Sodium 136 135 - 145 mmol/L   Potassium 3.5 3.5 - 5.1 mmol/L    Comment: DELTA CHECK NOTED   Chloride 99 (L) 101 - 111 mmol/L   CO2 25 22 - 32 mmol/L   Glucose, Bld 194 (H) 65 - 99 mg/dL   BUN 37 (H) 6 - 20 mg/dL   Creatinine, Ser 1.23 0.61 - 1.24 mg/dL   Calcium 8.3 (L) 8.9 - 10.3 mg/dL   GFR calc non Af Amer 52 (L) >60 mL/min   GFR calc Af Amer >60 >60 mL/min    Comment: (NOTE) The eGFR has been calculated using the CKD EPI equation. This calculation has not been validated in all clinical situations. eGFR's persistently <60 mL/min signify possible Chronic Kidney Disease.    Anion gap 12 5 - 15  Lipase, blood     Status: None   Collection Time: 10/18/15  5:30 AM  Result Value Ref Range   Lipase 33 11 - 51 U/L  Troponin I (q 6hr x 3)     Status: Abnormal   Collection Time: 10/18/15  8:15 AM  Result Value Ref Range   Troponin I 0.41 (H) <0.031 ng/mL    Comment:        PERSISTENTLY INCREASED TROPONIN VALUES IN THE RANGE OF 0.04-0.49 ng/mL CAN BE SEEN IN:       -UNSTABLE ANGINA       -CONGESTIVE HEART FAILURE       -MYOCARDITIS       -CHEST TRAUMA       -ARRYHTHMIAS       -LATE PRESENTING MYOCARDIAL INFARCTION       -COPD   CLINICAL FOLLOW-UP RECOMMENDED.   Troponin I (q 6hr x 3)     Status: Abnormal   Collection Time: 10/18/15  1:54 PM  Result Value Ref Range   Troponin I 0.46 (H) <0.031 ng/mL    Comment:  PERSISTENTLY INCREASED TROPONIN VALUES IN THE RANGE OF 0.04-0.49 ng/mL CAN BE SEEN IN:       -UNSTABLE ANGINA        -CONGESTIVE HEART FAILURE       -MYOCARDITIS       -CHEST TRAUMA       -ARRYHTHMIAS       -LATE PRESENTING MYOCARDIAL INFARCTION       -COPD   CLINICAL FOLLOW-UP RECOMMENDED.   Basic metabolic panel     Status: Abnormal   Collection Time: 10/19/15  4:40 AM  Result Value Ref Range   Sodium 136 135 - 145 mmol/L   Potassium 4.4 3.5 - 5.1 mmol/L    Comment: DELTA CHECK NOTED   Chloride 101 101 - 111 mmol/L   CO2 25 22 - 32 mmol/L   Glucose, Bld 193 (H) 65 - 99 mg/dL   BUN 38 (H) 6 - 20 mg/dL   Creatinine, Ser 1.24 0.61 - 1.24 mg/dL   Calcium 8.3 (L) 8.9 - 10.3 mg/dL   GFR calc non Af Amer 52 (L) >60 mL/min   GFR calc Af Amer 60 (L) >60 mL/min    Comment: (NOTE) The eGFR has been calculated using the CKD EPI equation. This calculation has not been validated in all clinical situations. eGFR's persistently <60 mL/min signify possible Chronic Kidney Disease.    Anion gap 10 5 - 15  Protime-INR     Status: Abnormal   Collection Time: 10/19/15  4:40 AM  Result Value Ref Range   Prothrombin Time 23.4 (H) 11.6 - 15.2 seconds   INR 2.10 (H) 0.00 - 1.49  CBC     Status: Abnormal   Collection Time: 10/19/15  4:40 AM  Result Value Ref Range   WBC 12.9 (H) 4.0 - 10.5 K/uL   RBC 3.23 (L) 4.22 - 5.81 MIL/uL   Hemoglobin 9.5 (L) 13.0 - 17.0 g/dL   HCT 30.2 (L) 39.0 - 52.0 %   MCV 93.5 78.0 - 100.0 fL   MCH 29.4 26.0 - 34.0 pg   MCHC 31.5 30.0 - 36.0 g/dL   RDW 15.1 11.5 - 15.5 %   Platelets 208 150 - 400 K/uL  Glucose, capillary     Status: Abnormal   Collection Time: 10/19/15 11:50 AM  Result Value Ref Range   Glucose-Capillary 162 (H) 65 - 99 mg/dL  Urinalysis, Routine w reflex microscopic (not at [facility])     Status: Abnormal   Collection Time: 10/19/15  2:27 PM  Result Value Ref Range   Color, Urine RED (A) YELLOW    Comment: BIOCHEMICALS MAY BE AFFECTED BY COLOR   APPearance TURBID (A) CLEAR   Specific Gravity, Urine 1.020 1.005 - 1.030   pH 5.0 5.0 - 8.0   Glucose, UA  NEGATIVE NEGATIVE mg/dL   Hgb urine dipstick LARGE (A) NEGATIVE   Bilirubin Urine LARGE (A) NEGATIVE   Ketones, ur 40 (A) NEGATIVE mg/dL   Protein, ur 100 (A) NEGATIVE mg/dL   Nitrite POSITIVE (A) NEGATIVE   Leukocytes, UA LARGE (A) NEGATIVE  Urine microscopic-add on     Status: Abnormal   Collection Time: 10/19/15  2:27 PM  Result Value Ref Range   Squamous Epithelial / LPF 0-5 (A) NONE SEEN   WBC, UA 6-30 0 - 5 WBC/hpf   RBC / HPF TOO NUMEROUS TO COUNT 0 - 5 RBC/hpf   Bacteria, UA MANY (A) NONE SEEN  Glucose, capillary     Status: Abnormal   Collection  Time: 10/19/15  4:39 PM  Result Value Ref Range   Glucose-Capillary 185 (H) 65 - 99 mg/dL  Heparin level (unfractionated)     Status: Abnormal   Collection Time: 10/19/15 10:54 PM  Result Value Ref Range   Heparin Unfractionated <0.10 (L) 0.30 - 0.70 IU/mL    Comment:        IF HEPARIN RESULTS ARE BELOW EXPECTED VALUES, AND PATIENT DOSAGE HAS BEEN CONFIRMED, SUGGEST FOLLOW UP TESTING OF ANTITHROMBIN III LEVELS. REPEATED TO VERIFY   Basic metabolic panel     Status: Abnormal   Collection Time: 10/20/15  6:39 AM  Result Value Ref Range   Sodium 136 135 - 145 mmol/L   Potassium 4.0 3.5 - 5.1 mmol/L   Chloride 102 101 - 111 mmol/L   CO2 27 22 - 32 mmol/L   Glucose, Bld 155 (H) 65 - 99 mg/dL   BUN 32 (H) 6 - 20 mg/dL   Creatinine, Ser 1.12 0.61 - 1.24 mg/dL   Calcium 8.2 (L) 8.9 - 10.3 mg/dL   GFR calc non Af Amer 58 (L) >60 mL/min   GFR calc Af Amer >60 >60 mL/min    Comment: (NOTE) The eGFR has been calculated using the CKD EPI equation. This calculation has not been validated in all clinical situations. eGFR's persistently <60 mL/min signify possible Chronic Kidney Disease.    Anion gap 7 5 - 15  CBC     Status: Abnormal   Collection Time: 10/20/15  6:39 AM  Result Value Ref Range   WBC 10.7 (H) 4.0 - 10.5 K/uL   RBC 2.87 (L) 4.22 - 5.81 MIL/uL   Hemoglobin 8.5 (L) 13.0 - 17.0 g/dL   HCT 26.8 (L) 39.0 - 52.0 %    MCV 93.4 78.0 - 100.0 fL   MCH 29.6 26.0 - 34.0 pg   MCHC 31.7 30.0 - 36.0 g/dL   RDW 15.2 11.5 - 15.5 %   Platelets 186 150 - 400 K/uL  Hepatic function panel     Status: Abnormal   Collection Time: 10/20/15  6:39 AM  Result Value Ref Range   Total Protein 5.2 (L) 6.5 - 8.1 g/dL   Albumin 1.8 (L) 3.5 - 5.0 g/dL   AST 74 (H) 15 - 41 U/L   ALT 71 (H) 17 - 63 U/L   Alkaline Phosphatase 575 (H) 38 - 126 U/L   Total Bilirubin 1.7 (H) 0.3 - 1.2 mg/dL   Bilirubin, Direct 0.9 (H) 0.1 - 0.5 mg/dL   Indirect Bilirubin 0.8 0.3 - 0.9 mg/dL  Protime-INR     Status: Abnormal   Collection Time: 10/20/15  6:39 AM  Result Value Ref Range   Prothrombin Time 19.8 (H) 11.6 - 15.2 seconds   INR 1.68 (H) 0.00 - 1.49  Heparin level (unfractionated)     Status: None   Collection Time: 10/20/15  6:39 AM  Result Value Ref Range   Heparin Unfractionated 0.66 0.30 - 0.70 IU/mL    Comment:        IF HEPARIN RESULTS ARE BELOW EXPECTED VALUES, AND PATIENT DOSAGE HAS BEEN CONFIRMED, SUGGEST FOLLOW UP TESTING OF ANTITHROMBIN III LEVELS.   Heparin level (unfractionated)     Status: Abnormal   Collection Time: 10/20/15  4:01 PM  Result Value Ref Range   Heparin Unfractionated 0.27 (L) 0.30 - 0.70 IU/mL    Comment:        IF HEPARIN RESULTS ARE BELOW EXPECTED VALUES, AND PATIENT DOSAGE HAS BEEN CONFIRMED,  SUGGEST FOLLOW UP TESTING OF ANTITHROMBIN III LEVELS.   Hemoglobin and hematocrit, blood     Status: Abnormal   Collection Time: 10/20/15  4:01 PM  Result Value Ref Range   Hemoglobin 8.3 (L) 13.0 - 17.0 g/dL   HCT 26.5 (L) 39.0 - 52.0 %   No results found for this or any previous visit (from the past 240 hour(s)). Creatinine:  Recent Labs  10/17/15 2057 10/18/15 0530 10/19/15 0440 10/20/15 0639  CREATININE 1.33* 1.23 1.24 1.12    Xrays:   Impression/Assessment:    Case discussed with Dr. Cephus Shelling. I see no reason for Mr mones to have foley catheter any more, and will write for foley  removal. Understand that catheters should be taken out in early AM.   Plan:   Foley out in early AM.   Arnie Clingenpeel I Mauri Tolen 10/20/2015, 6:18 PM

## 2015-10-20 NOTE — Progress Notes (Signed)
Patient to have cardioversion at 12 noon. Will hold breakfast.

## 2015-10-20 NOTE — Progress Notes (Signed)
ANTICOAGULATION CONSULT NOTE   Pharmacy Consult for heparin  Indication: atrial fibrillation  No Known Allergies  Patient Measurements: Height: 5\' 10"  (177.8 cm) Weight: 221 lb 3.2 oz (100.336 kg) IBW/kg (Calculated) : 73 Heparin Dosing Weight: 93  Vital Signs: Temp: 97.9 F (36.6 C) (03/17 1602) Temp Source: Oral (03/17 1116) BP: 109/59 mmHg (03/17 1602) Pulse Rate: 66 (03/17 1602)  Labs:  Recent Labs  10/18/15 0225 10/18/15 0530 10/18/15 0815 10/18/15 1354 10/19/15 0440 10/19/15 2254 10/20/15 0639 10/20/15 1601  HGB  --  9.5*  --   --  9.5*  --  8.5* 8.3*  HCT  --  28.0*  --   --  30.2*  --  26.8* 26.5*  PLT  --  185  --   --  208  --  186  --   LABPROT  --  46.5*  --   --  23.4*  --  19.8*  --   INR  --  5.23*  --   --  2.10*  --  1.68*  --   HEPARINUNFRC  --   --   --   --   --  <0.10* 0.66 0.27*  CREATININE  --  1.23  --   --  1.24  --  1.12  --   TROPONINI 0.55*  --  0.41* 0.46*  --   --   --   --     Estimated Creatinine Clearance: 58.3 mL/min (by C-G formula based on Cr of 1.12).  Assessment: 65 yoM on warfarin PTA for afib admitted with acute on chronic diastolic CHF. Planning for DCCV on 3/17. Repeat HL is now mildly sub-therapeutic at 0.27 -noted with hematuria 3/16 and CT with chronic subdural hematoma on 3/15 -Hg= 8.3, plt= 186. RN reports no s/s of bleeding    Goal of Therapy:  Heparin level= 0.3-0.5 Monitor platelets by anticoagulation protocol: Yes   Plan:   -Increase heparin slightly to 1550 units/hr -Heparin level in 8 hours and daily wth CBC daily  Albertina Parr, PharmD., BCPS Clinical Pharmacist Pager [phone]

## 2015-10-20 NOTE — Progress Notes (Signed)
Pt adm to PACU with #2 NS bolus infusing & Heparin drip at 1500 units/hr. He is awake, alert, mentating clearly, says he feels "a lot better". Will cont to monitor.

## 2015-10-20 NOTE — Progress Notes (Addendum)
[address]       [place] [address]             [phone]          Procedure(s) (LRB): CARDIOVERSION (N/A)  Subjective: Patient in PACU as is s/p  DCCV  Objective: Vital signs in last 24 hours: Temp:  [97.8 F (36.6 C)-98.5 F (36.9 C)] 98.5 F (36.9 C) (03/17 0635) Pulse Rate:  [72-85] 72 (03/17 0635) Cardiac Rhythm:  [-] Atrial fibrillation (03/16 1950) Resp:  [18-21] 21 (03/17 0635) BP: (103-113)/(61-71) 113/61 mmHg (03/17 0635) SpO2:  [93 %-94 %] 94 % (03/17 0635) Weight:  [221 lb 3.2 oz (100.336 kg)] 221 lb 3.2 oz (100.336 kg) (03/17 3818)  Current Weight  10/20/15 221 lb 3.2 oz (100.336 kg)       Intake/Output from previous day: 03/16 0701 - 03/17 0700 In: 1468.5 [P.O.:1080; I.V.:138.5; IV Piggyback:250] Out: 2993 [Urine:1375]   Physical Exam:  Cardiovascular: RRR, murmur Pulmonary: Slightly diminished at bases Abdomen: Soft, non tender, bowel sounds present. Extremities: +++ bilateral lower extremity edema. Wounds: Clean and dry.  No erythema or signs of infection.  Lab Results: CBC:  Recent Labs  10/19/15 0440 10/20/15 0639  WBC 12.9* 10.7*  HGB 9.5* 8.5*  HCT 30.2* 26.8*  PLT 208 186   BMET:   Recent Labs  10/19/15 0440 10/20/15 0639  NA 136 136  K 4.4 4.0  CL 101 102  CO2 25 27  GLUCOSE 193* 155*  BUN 38* 32*  CREATININE 1.24 1.12  CALCIUM 8.3* 8.2*    PT/INR:  Lab Results  Component Value Date   INR 1.68* 10/20/2015   INR 2.10* 10/19/2015   INR 5.23* 10/18/2015   ABG:  INR: Will add last result for INR, ABG once components are confirmed Will add last 4 CBG results once components are confirmed  Assessment/Plan:  1. CV - S/p mini MV repair on 10/04/2015. A fib.On Amiodarone 200 mg daily and Lopressor 25 mg bid. On Heparin drip. Remains in SR s/p DCCV with HR in the 60's. 2.  Previous AMS-Remains AAOx 3 .CT head showed 14 mm RIGHT holo hemispheric chronic subdural hematoma without midline  shift (increased from 11 mm in '14) 3. Volume Overload - On 40 mg IV Lasix BID 4.  Normocytic anemia - H and H 8.5 and 26.8 5. Elevated transaminases, alk phos, and bilirubin but all continue to decrease. Abd US showed stones and sludge within GB and GB wall thickening, possible mild hepatic cirrhosis, minimal ascites. 6. Coagulopathy-INR was 6.13 in ED. Coumadin on hold. INR now down to 1.68 7. Leukocytosis (WBC decreased to 10,700) with NO fever.  8. GU-history of prostate cancer.  On Proscar 5 mg daily and Flomax 0.4 mg daily. Bloody urine in foley bag. Recommend removing foley as soon as possible  [name],DONIELLE MPA-C 10/20/2015,8:33 AM    I have seen and examined the patient and agree with the assessment and plan as outlined.  Rexene Alberts, MD 10/20/2015

## 2015-10-20 NOTE — Anesthesia Preprocedure Evaluation (Signed)
Anesthesia Evaluation  Patient identified by MRN, date of birth, ID bandGeneral Assessment Comment:Pt sedated and intubated.  Reviewed: Allergy & Precautions, H&P , NPO status , Patient's Chart, lab work & pertinent test results  History of Anesthesia Complications Negative for: history of anesthetic complications  Airway Mallampati: Intubated  TM Distance: >3 FB     Dental no notable dental hx. (+) Edentulous Upper, Edentulous Lower   Pulmonary shortness of breath, former smoker,    Pulmonary exam normal breath sounds clear to auscultation       Cardiovascular hypertension, Pt. on medications and Pt. on home beta blockers + CAD, + Past MI, + Cardiac Stents and +CHF  Normal cardiovascular exam+ dysrhythmias Supra Ventricular Tachycardia + Valvular Problems/Murmurs MR  Rhythm:regular Rate:Normal + Systolic murmurs    Neuro/Psych PSYCHIATRIC DISORDERS negative neurological ROS     GI/Hepatic negative GI ROS, Neg liver ROS,   Endo/Other  negative endocrine ROSdiabetes, Type 2Morbid obesity  Renal/GU Renal InsufficiencyRenal disease     Musculoskeletal  (+) Arthritis ,   Abdominal   Peds  Hematology  (+) anemia ,   Anesthesia Other Findings History of ACDF at C4-C5  Reproductive/Obstetrics                             Anesthesia Physical  Anesthesia Plan  ASA: IV  Anesthesia Plan: General   Post-op Pain Management:    Induction: Intravenous  Airway Management Planned: Mask  Additional Equipment:   Intra-op Plan:   Post-operative Plan:   Informed Consent: I have reviewed the patients History and Physical, chart, labs and discussed the procedure including the risks, benefits and alternatives for the proposed anesthesia with the patient or authorized representative who has indicated his/her understanding and acceptance.   Dental advisory given  Plan Discussed with:  CRNA  Anesthesia Plan Comments:         Anesthesia Quick Evaluation

## 2015-10-20 NOTE — Progress Notes (Addendum)
Patient Name: [name] Date of Encounter: 10/20/2015   SUBJECTIVE  No chest pain and sob. The patient did not know that he was scheduled to TEE.   CURRENT MEDS . amiodarone  200 mg Oral Daily  . aspirin EC  81 mg Oral Daily  . cefTRIAXone (ROCEPHIN)  IV  2 g Intravenous Q24H  . feeding supplement (ENSURE ENLIVE)  237 mL Oral BID BM  . finasteride  5 mg Oral Daily  . furosemide  40 mg Intravenous BID  . metoprolol tartrate  50 mg Oral BID  . multivitamin with minerals  1 tablet Oral Daily  . pravastatin  40 mg Oral q1800  . sodium chloride flush  3 mL Intravenous Q12H  . tamsulosin  0.4 mg Oral Daily  . vancomycin  1,250 mg Intravenous Q24H    OBJECTIVE  Filed Vitals:   10/19/15 1132 10/19/15 2203 10/20/15 0635 10/20/15 0921  BP: 103/71 106/65 113/61 114/59  Pulse: 85 79 72 76  Temp:  97.8 F (36.6 C) 98.5 F (36.9 C) 97.3 F (36.3 C)  TempSrc:  Oral Oral Oral  Resp:  18 21   Height:      Weight:   221 lb 3.2 oz (100.336 kg)   SpO2:  93% 94% 96%    Intake/Output Summary (Last 24 hours) at 10/20/15 1016 Last data filed at 10/20/15 0929  Gross per 24 hour  Intake 1228.52 ml  Output   1375 ml  Net -146.48 ml   Filed Weights   10/18/15 0211 10/19/15 0421 10/20/15 0635  Weight: 220 lb 11.2 oz (100.109 kg) 220 lb 12.8 oz (100.154 kg) 221 lb 3.2 oz (100.336 kg)    PHYSICAL EXAM  General: Pleasant, NAD. Neuro: Alert and oriented X 3. Moves all extremities spontaneously. Psych: Normal affect. HEENT:  Normal  Neck: Supple without bruits or JVD. Lungs:  Resp regular and unlabored, CTA. Heart: Ir Ir no s3, R6/ 3/6 holo systolic murmurs. Abdomen: Soft, non-tender, non-distended, BS + x 4.  Extremities: No clubbing, cyanosis or edema. DP/PT/Radials 2+ and equal bilaterally.  Accessory Clinical Findings  CBC  Recent Labs  10/17/15 2057  10/19/15 0440 10/20/15 0639  WBC 16.8*  < > 12.9* 10.7*  NEUTROABS 13.7*  --   --   --   HGB 9.5*  < > 9.5* 8.5*    HCT 29.4*  < > 30.2* 26.8*  MCV 92.5  < > 93.5 93.4  PLT 212  < > 208 186  < > = values in this interval not displayed. Basic Metabolic Panel  Recent Labs  10/19/15 0440 10/20/15 0639  NA 136 136  K 4.4 4.0  CL 101 102  CO2 25 27  GLUCOSE 193* 155*  BUN 38* 32*  CREATININE 1.24 1.12  CALCIUM 8.3* 8.2*   Liver Function Tests  Recent Labs  10/18/15 0530 10/20/15 0639  AST 91* 74*  ALT 74* 71*  ALKPHOS 630* 575*  BILITOT 3.3* 1.7*  PROT 5.2* 5.2*  ALBUMIN 1.9* 1.8*    Recent Labs  10/18/15 0530  LIPASE 33   Cardiac Enzymes  Recent Labs  10/18/15 0225 10/18/15 0815 10/18/15 1354  TROPONINI 0.55* 0.41* 0.46*    TELE  afib at controlled rate  Radiology/Studies  Dg Chest 2 View  10/19/2015  CLINICAL DATA:  Shortness of breath. EXAM: CHEST  2 VIEW COMPARISON:  October 17, 2015. FINDINGS: Stable cardiomegaly. No pneumothorax is noted. Left lung is clear. Stable right basilar opacity is  noted concerning for edema, pneumonia or atelectasis with moderate associated pleural effusion. Bony thorax is unremarkable. IMPRESSION: Stable right basilar opacity concerning for pneumonia, atelectasis or edema with associated pleural effusion. Electronically Signed   By: Marijo Conception, M.D.   On: 10/19/2015 08:10   Dg Chest 2 View  10/12/2015  CLINICAL DATA:  82 year old male with recent cardiac surgery. Recent right chest tube for pneumothorax. Initial encounter. EXAM: CHEST  2 VIEW COMPARISON:  10/11/2015 and earlier. FINDINGS: Right chest tube removed. Trace if any right apical pneumothorax today, regressed since yesterday. Streaky peripheral and basilar opacity on the right has not significantly changed. Mildly improved lung volumes. Stable cardiac size and mediastinal contours. The patient arms are raised on the lateral view limiting its utility. Small left pleural effusion. No acute pulmonary edema. No areas of worsening ventilation. Stable visualized osseous structures.  Flowing osteophytes or syndesmophytes in the thoracic spine. IMPRESSION: 1. Right chest tube removed with trace if any residual right apical pneumothorax. 2. Small pleural effusions. Stable patchy peripheral and basilar opacity in the right lung. Electronically Signed   By: Genevie Ann M.D.   On: 10/12/2015 07:57   Dg Chest 2 View  10/11/2015  CLINICAL DATA:  Chest discomfort, chest tube in place on the right, recent mitral valve repair and subsequent wound exploration. EXAM: CHEST  2 VIEW COMPARISON:  Portable chest x-ray of October 09, 2015 FINDINGS: There is persistent mild volume loss on the right. There is a pneumothorax on the right amounting to approximately 10% or less of the lung volume. There is infiltrate in the right upper lobe which accentuates the pleural line. Patchy interstitial density in the right mid and lower lung is stable. The upper right-sided chest tube tip projects over the posterior aspect of the fifth rib. The lower chest tube tip is not well demonstrated but appears to lie in the inferior aspect of the right pleural space. There remains a small right pleural effusion. There is no mediastinal shift. On the left there is basilar atelectasis and possible trace pleural effusion. The mid and upper lung are clear. The cardiac silhouette remains enlarged and the central pulmonary vascularity remains engorged. IMPRESSION: 10% or less right apical pneumothorax. The right-sided chest tubes are in stable position. Persistent interstitial and alveolar opacities on the right. Minimal left basilar atelectasis. Stable cardiomegaly with mild central pulmonary vascular prominence. Critical Value/emergent results were called by telephone at the time of interpretation on 10/11/2015 at 7:54 am to Shirley Muscat, RN, , who verbally acknowledged these results. Electronically Signed   By: David  Martinique M.D.   On: 10/11/2015 07:50   Dg Chest 2 View  10/02/2015  CLINICAL DATA:  Preoperative assessment for cardiac  surgery ; history hypertension, coronary artery disease, pulmonary hypertension, SVT, mitral regurgitation, chronic diastolic congestive heart failure EXAM: CHEST  2 VIEW COMPARISON:  07/05/2015 FINDINGS: Enlargement of cardiac silhouette. Mild atherosclerotic calcification aorta. Mediastinal contours and pulmonary vascularity normal. Lungs clear. No pleural effusion or pneumothorax. Diffuse osseous demineralization. IMPRESSION: Enlargement of cardiac silhouette. No acute abnormalities. Electronically Signed   By: Lavonia Dana M.D.   On: 10/02/2015 14:48   Ct Head Wo Contrast  10/18/2015  CLINICAL DATA:  Shortness of breath, altered mental status after heart surgery 2 weeks ago. History of diabetes, hypertension, prostate cancer. EXAM: CT HEAD WITHOUT CONTRAST TECHNIQUE: Contiguous axial images were obtained from the base of the skull through the vertex without intravenous contrast. COMPARISON:  CT head November 24, 2012 FINDINGS:  The ventricles and sulci are normal for age. Symmetric basal ganglia mineralization. No intraparenchymal hemorrhage. Patchy supratentorial white matter hypodensities are within normal range for patient's age and though non-specific suggest sequelae of chronic small vessel ischemic disease. No acute large vascular territory infarcts. RIGHT holo hemispheric cerebral spinal fluid equivalent subdural collection was 11 mm, now 14 mm. Mass effect on subjacent sulci without midline shift. Basal cisterns are patent. Minimal calcific atherosclerosis of the carotid siphons. No skull fracture. The included ocular globes and orbital contents are non-suspicious. Status post bilateral ocular lens implants. Bilateral maxillary sinus mucosal retention cysts without air-fluid levels. Imaged mastoid air cells are well aerated. Patient is edentulous. IMPRESSION: 14 mm RIGHT holo hemispheric chronic subdural hematoma without midline shift. Otherwise negative CT head for age. Electronically Signed   By:  Elon Alas M.D.   On: 10/18/2015 01:47   US Abdomen Complete  10/18/2015  CLINICAL DATA:  Acute onset of elevated LFTs.  Initial encounter. EXAM: ABDOMEN ULTRASOUND COMPLETE COMPARISON:  CTA of the abdomen and pelvis from 08/02/2015 FINDINGS: Gallbladder: Stones and sludge are noted within the gallbladder. Gallbladder wall thickening is noted, measuring up to 5 mm. No ultrasonographic Murphy's sign is elicited. No pericholecystic fluid is seen. Common bile duct: Diameter: 0.5 cm, within normal limits in caliber. Liver: No focal lesion identified. Somewhat coarsened hepatic echotexture, raising concern for fatty infiltration. The hepatic contour is slightly nodular, raising question for mild hepatic cirrhosis. IVC: No abnormality visualized. Pancreas: Visualized portion unremarkable. Spleen: Size and appearance within normal limits. Right Kidney: Length: 11.0 cm. Echogenicity within normal limits. No mass or hydronephrosis visualized. A small amount of fluid is noted between the liver and right kidney. Left Kidney: Length: 10.7 cm. Echogenicity within normal limits. No mass or hydronephrosis visualized. Abdominal aorta: No aneurysm visualized. Not well characterized proximally or distally due to overlying structures. Other findings: None. IMPRESSION: 1. Stones and sludge within the gallbladder. Gallbladder wall thickening noted. This may reflect chronic inflammation, though would correlate with the patient's symptoms to exclude acute cholecystitis. No evidence of obstruction. 2. Slightly nodular hepatic contour raises question for mild hepatic cirrhosis. Underlying fatty infiltration noted. 3. Trace fluid between the liver and right kidney reflects minimal ascites. Electronically Signed   By: Garald Balding M.D.   On: 10/18/2015 00:58   Dg Chest Port 1 View  10/17/2015  CLINICAL DATA:  Shortness of breath, bilateral lower extremity edema for 2 weeks. Open heart surgery 2 weeks ago. History of  hypertension, CHF, diabetes, coronary artery disease, dysrhythmia EXAM: PORTABLE CHEST 1 VIEW COMPARISON:  Chest x-rays dated 10/12/2015 and 10/09/2015. FINDINGS: Dense opacity at the right lung base is slightly more prominent on today's study, most likely a combination of atelectasis and small pleural effusion. Left lung remains clear. Cardiomegaly is stable. No pneumothorax seen. No acute osseous abnormality seen. Degenerative changes again noted about the right shoulder. IMPRESSION: Dense opacity at the right lung base appears slightly more prominent on today's study, most likely a combination of atelectasis and small pleural effusion, presumably with a slight interval increase compared to the previous exam. Stable cardiomegaly. Electronically Signed   By: Franki Cabot M.D.   On: 10/17/2015 19:18   Dg Chest Port 1 View  10/09/2015  CLINICAL DATA:  Status post mitral valve repair EXAM: PORTABLE CHEST 1 VIEW COMPARISON:  10/08/2015 FINDINGS: The cardiac shadow is enlarged but stable. Two right-sided chest tubes are again seen and stable no pneumothorax is noted. Right basilar atelectasis is noted  slightly progressed when compare with the prior exam. No bony abnormality is noted. IMPRESSION: Increased right basilar atelectatic changes. Thoracostomy catheters without pneumothorax. Although not mentioned in the body of the report, a left jugular sheath remains. Electronically Signed   By: Inez Catalina M.D.   On: 10/09/2015 07:16   Dg Chest Port 1 View  10/08/2015  CLINICAL DATA:  Post MVR 10/04/2015, history mitral valve prolapse, coronary artery disease, diabetes mellitus, hypertension, former smoker, pulmonary hypertension, chronic diastolic heart failure EXAM: PORTABLE CHEST 1 VIEW COMPARISON:  Portable exam 0512 hours compared to 10/07/2015 FINDINGS: RIGHT thoracostomy tubes unchanged. LEFT jugular central venous catheter unchanged. Epicardial pacing leads noted. Enlargement of cardiac silhouette with  pulmonary vascular congestion. Persistent atelectasis and minimal effusion at mid inferior RIGHT chest. Mild LEFT basilar atelectasis and small LEFT pleural effusion. No definite pneumothorax. IMPRESSION: RIGHT thoracostomy tubes with persistent basilar atelectasis and small effusion without pneumothorax per enlargement of cardiac silhouette with pulmonary vascular congestion. Persistent mile LEFT basilar atelectasis and small pleural effusion. Electronically Signed   By: Lavonia Dana M.D.   On: 10/08/2015 07:14   Dg Chest Port 1 View  10/07/2015  CLINICAL DATA:  Congestive heart failure. EXAM: PORTABLE CHEST 1 VIEW COMPARISON:  October 06, 2015 FINDINGS: The PA catheter has been removed with a left IJ sheath remaining. Other support apparatus is stable. No pneumothorax identified. Bilateral layering effusions with underlying atelectasis are stable. More diffuse opacity remains in the right mid and lower lung. Stable cardiomegaly. No other interval changes. IMPRESSION: Interval removal of the PA catheter. Small bilateral layering effusions with underlying atelectasis. Persistent opacity in the right mid and lower lung. Electronically Signed   By: Dorise Bullion III M.D   On: 10/07/2015 07:02   Dg Chest Port 1 View  10/06/2015  CLINICAL DATA:  Status postextubation.  Recent mitral valve repair EXAM: PORTABLE CHEST 1 VIEW COMPARISON:  October 05, 2015 FINDINGS: Endotracheal tube is in nasogastric tube have been removed. Swan-Ganz catheter tip is in the main pulmonary outflow tract. There is a chest tube on the right. Temporary pacemaker wires are attached to the right heart. No pneumothorax. There is persistent right effusion with right middle and lower lobe volume loss with patchy airspace opacity in these areas. There is also patchy airspace opacity in the left lower lobe. There is cardiomegaly. The pulmonary vascularity shows evidence of mild pulmonary venous hypertension. No adenopathy evident. IMPRESSION: Tube and  catheter positions as described without pneumothorax. Findings consistent with a degree of congestive heart failure. Atelectasis and possibly pneumonia in the right middle and both lower lobes cannot be excluded. The appearance is similar to 1 day prior. Electronically Signed   By: Lowella Grip III M.D.   On: 10/06/2015 07:55   Dg Chest Port 1 View  10/05/2015  CLINICAL DATA:  Status post mitral valve repair postop 1 and sternal re- incision. EXAM: PORTABLE CHEST 1 VIEW COMPARISON:  10/05/2015 and prior exam FINDINGS: An endotracheal tube with tip 4.2 cm above the carina, left IJ Swan-Ganz catheter with tip overlying the main pulmonary artery, and mediastinal/right thoracostomy tubes again noted. Improved right lung aeration noted with decreased edema/ airspace opacity. Bilateral lower lung opacities/ atelectasis/consolidation again noted. There is no evidence of pneumothorax. No other significant changes identified. IMPRESSION: Improved aeration bilaterally with decreased right lung edema/airspace opacity. Support apparatus as described.  No evidence of pneumothorax. Continued bilateral lower lung opacities. Electronically Signed   By: Margarette Canada M.D.   On:  10/05/2015 18:31   Dg Chest Port 1 View  10/05/2015  CLINICAL DATA:  Atelectasis. EXAM: PORTABLE CHEST 1 VIEW COMPARISON:  10/04/2015. FINDINGS: Endotracheal tube 1.7 cm above the carina, slight proximal repositioning should be considered. Swan-Ganz catheter noted with tip in the pulmonary outflow tract. NG tube noted with tip below left hemidiaphragm. Right chest tubes and mediastinal drainage catheter in stable position. Stable cardiomegaly. Cardiac valve replacement. Progressive bibasilar atelectasis and/or infiltrates.No pneumothorax. Small bilateral pleural effusions. No acute bony abnormality . Cervical spine fusion. IMPRESSION: 1. Endotracheal tube 1.7 cm above the carina, slight proximal repositioning should be considered. Remaining lines and  tubes including right chest tubes in stable position. No pneumothorax 2. Low lung volumes with progressive bibasilar atelectasis and/or infiltrates/edema. Small bilateral pleural effusions. 3.  Persistent cardiomegaly.  Prior cardiac valve replacement. Electronically Signed   By: Marcello Moores  Register   On: 10/05/2015 08:06   Dg Chest Port 1 View  10/04/2015  CLINICAL DATA:  Minimally invasive mitral valve repair a EXAM: PORTABLE CHEST 1 VIEW COMPARISON:  10/02/2015 FINDINGS: Endotracheal tube 4 cm from carina. NG tube in stomach. Swan-Ganz catheter with tip in the main pulmonary artery. Two RIGHT chest tubes in place. Volume loss in the RIGHT hemi thorax. This atelectasis the RIGHT upper lobe RIGHT lower lobe with small effusion. No pneumothorax. LEFT lung clear. IMPRESSION: 1. Support apparatus appears in good position. 2. RIGHT lung atelectasis, effusion and 2 chest tubes in RIGHT hemi thorax. Electronically Signed   By: Suzy Bouchard M.D.   On: 10/04/2015 17:41    ASSESSMENT AND PLAN   1. Acute on chronic diastolic CHF - significant - however tricky situation, not good response to iv lasix, soft BP and LVOT obstruction, we don't want to replete intravascular volume. Net diuresis of negative 1.9L however +93.5cc yesterday.   2. Altered mental status.  improved. Given INR >6, concern for possible intracranial bleed, CT head showed 14 mm RIGHT holo hemispheric chronic subdural hematoma without midline shift.-- check ammonia level was 24 (WNL).  INR now 2  3. Elevated LFTs and coagulopathy. Likely due to hepatic congestion, although more of an obstructive pattern, with alk phos and t bili elevated and minimal transaminitis. Less likely due to amiodarone, which was recently initiated. -- Abdominal ultrasound showed stones and sludge within GB and GB wall thickening, possible mild hepatic cirrhosis, minimal ascites.  -- Held Coumadin. given Vitamin K, even though his INR is < 10 and he has no overt  bleeding, he is at greater risk of bleeding (age, decompensated heart failure, and expanding chronic subdural hematoma on head CT)  - LFT minimally improved this morning.   4. Atrial fibrillation. Currently rate-controlled -- Continue metoprolol and amiodarone -- Held Coumadin. Started Heparin yesterday. INR of 1.68 today. LFT minimally elevated. Plan for DCCV today. Explain risk and benefit. No TEE. Likely switch of coumadin post DCCV.   5. Coronary artery disease s/p DES to LAD and RCA in 4/16. EKG without acute ischemic changes. Troponin is mildly elevated (0.55-->0.41), but this is likely demand in the setting of volume overload. Was not discharged on DAPT, but he is almost 1 year out from intervention, so low risk for stent thrombosis.  -- Continue aspirin 81 mg, as there is no overt signs of bleeding. -- Continue pravastatin (unclear why he is not on a high-potency statin, but this can be addressed by his primary cardiologist)  6. S/p mini MV repair on 10/04/2015.   7. ? Pneumonia with leucocytosis.  -  Continue ceftriaxone and vancomycin.   8. History of prostate cancer. On Proscar 5 mg daily and Flomax 0.4 mg daily. Review of order did not showed order of placement. The patient has urinary retention at home after recent discharge. Seems foley was placed in ED. Now he has UTI and bloody urine in foley bag. Dr. Roxy Manns recommend removing foley as soon as possible. Will remove today. Will Consult GU for recommendations.   7. Normocytic anemia. No significant difference from baseline while in the hospital, will monitor closely in the setting of elevated INR  Signed, Bhagat,Bhavinkumar PA-C Pager [phone]  The patient was seen, examined and discussed with Bhagat,Bhavinkumar PA-C and I agree with the above.   The patient had prolonged awakening post DCCV and deep sedation with propofol and was also hypotensive, received 1 L of fluid, and was on pressor support for a brief time, BP now  91/56 mmHg, mental status back to baseline, we will give another 500 cc of iv fluids, hold lasix as he is intravascular volume depleted (despite LE edema), and has LVOT obstruction that is contributing to his low BP. Transfer to stepdown for further monitoring. In SR, continue heparin drip He has hematuria, diagnosed with UTI, started on ATB, Hb is dropping, we will repeat and give 1 unit of PBRC later today if Hb < 8 g/L. We will consult urology as he has h/o prostate cancer and has gross hematuria.   Dorothy Spark 10/20/2015

## 2015-10-20 NOTE — Anesthesia Procedure Notes (Signed)
Date/Time: 10/20/2015 12:24 PM Performed by: Merrilyn Puma B Pre-anesthesia Checklist: Patient identified, Emergency Drugs available, Suction available, Patient being monitored and Timeout performed Patient Re-evaluated:Patient Re-evaluated prior to inductionOxygen Delivery Method: Ambu bag Intubation Type: IV induction Ventilation: Mask ventilation without difficulty Dental Injury: Teeth and Oropharynx as per pre-operative assessment

## 2015-10-20 NOTE — Care Management Important Message (Signed)
Important Message  Patient Details  Name: HENOCK BANAGA MRN: [id number] Date of Birth: [birth year]   Medicare Important Message Given:  Yes    Alverda Nazzaro P Dorrance 10/20/2015, 2:48 PM

## 2015-10-20 NOTE — Progress Notes (Signed)
hgb of 8.5 this morning. Yesterday was 9.5. Will get stat H&H. If decrased, give 1 PRBC.   [name], [place]

## 2015-10-20 NOTE — Anesthesia Postprocedure Evaluation (Signed)
Anesthesia Post Note  Patient: [name]  Procedure(s) Performed: Procedure(s) (LRB): CARDIOVERSION (N/A)  Patient location during evaluation: PACU Anesthesia Type: General Level of consciousness: sedated and patient cooperative Pain management: pain level controlled Vital Signs Assessment: post-procedure vital signs reviewed and stable Respiratory status: spontaneous breathing Cardiovascular status: stable Anesthetic complications: no    Last Vitals:  Filed Vitals:   10/20/15 1336 10/20/15 1341  BP: 103/62   Pulse:  61  Temp:    Resp:  15    Last Pain:  Filed Vitals:   10/20/15 1341  PainSc: 0-No pain                 Nolon Nations

## 2015-10-20 NOTE — CV Procedure (Signed)
    CARDIOVERSION NOTE  Procedure: Electrical Cardioversion Indications:  Atrial Fibrillation  Procedure Details:  Consent: Risks of procedure as well as the alternatives and risks of each were explained to the (patient/caregiver).  Consent for procedure obtained.  Time Out: Verified patient identification, verified procedure, site/side was marked, verified correct patient position, special equipment/implants available, medications/allergies/relevent history reviewed, required imaging and test results available.  Performed  Patient placed on cardiac monitor, pulse oximetry, supplemental oxygen as necessary.  Sedation given: Propofol per anesthesia Pacer pads placed anterior and posterior chest.  Cardioverted 1 time(s).  Cardioverted at 150J biphasic.  Impression: Findings: Post procedure EKG shows: NSR Complications: None Patient did tolerate procedure well.  Plan: 1.  Successful DCCV to NSR with a single 150J biphasic shock 2.  Patient was very sedate on arrival to endoscopy - post-procedurally he was hypotensive and remained slow to awaken. Anesthesia (Dr. Myrlene Broker) recommended PACU transfer and monitoring.  Time Spent Directly with the Patient:  30 minutes   Pixie Casino, MD, [facility] Attending Cardiologist [place] 10/20/2015, 12:56 PM

## 2015-10-21 ENCOUNTER — Encounter (HOSPITAL_COMMUNITY): Payer: Self-pay | Admitting: Internal Medicine

## 2015-10-21 DIAGNOSIS — I481 Persistent atrial fibrillation: Secondary | ICD-10-CM

## 2015-10-21 LAB — HEPARIN LEVEL (UNFRACTIONATED)
Heparin Unfractionated: 0.17 IU/mL — ABNORMAL LOW (ref 0.30–0.70)
Heparin Unfractionated: 0.52 IU/mL (ref 0.30–0.70)

## 2015-10-21 LAB — GLUCOSE, CAPILLARY
GLUCOSE-CAPILLARY: 158 mg/dL — AB (ref 65–99)
GLUCOSE-CAPILLARY: 135 mg/dL — AB (ref 65–99)
Glucose-Capillary: 126 mg/dL — ABNORMAL HIGH (ref 65–99)

## 2015-10-21 LAB — PROTIME-INR
INR: 1.61 — ABNORMAL HIGH (ref 0.00–1.49)
Prothrombin Time: 19.2 seconds — ABNORMAL HIGH (ref 11.6–15.2)

## 2015-10-21 LAB — URIC ACID: URIC ACID, SERUM: 7 mg/dL (ref 4.4–7.6)

## 2015-10-21 MED ORDER — FUROSEMIDE 10 MG/ML IJ SOLN
80.0000 mg | Freq: Two times a day (BID) | INTRAMUSCULAR | Status: DC
  Administered 2015-10-21 – 2015-10-27 (×12): 80 mg via INTRAVENOUS
  Filled 2015-10-21 (×12): qty 8

## 2015-10-21 MED ORDER — WARFARIN SODIUM 2 MG PO TABS
1.0000 mg | ORAL_TABLET | Freq: Once | ORAL | Status: AC
  Administered 2015-10-21: 1 mg via ORAL
  Filled 2015-10-21: qty 0.5

## 2015-10-21 MED ORDER — WARFARIN - PHARMACIST DOSING INPATIENT
Freq: Every day | Status: DC
  Administered 2015-10-26 – 2015-10-28 (×3)

## 2015-10-21 NOTE — Progress Notes (Signed)
SUBJECTIVE:  No acute distress.  Pain in right ankle.  Breathing OK.     PHYSICAL EXAM Filed Vitals:   10/20/15 1602 10/20/15 2014 10/21/15 0602 10/21/15 0947  BP: 109/59 125/54 114/56 116/57  Pulse: 66 72 69 79  Temp: 97.9 F (36.6 C) 98 F (36.7 C) 98.1 F (36.7 C) 97.6 F (36.4 C)  TempSrc:  Oral Oral Oral  Resp: 17 18 18 20   Height:      Weight:   200 lb 14.4 oz (91.128 kg)   SpO2: 100% 94% 95% 95%   General:  No acute distress Lungs:  Decreased breath sounds, mild crackles Heart:  RRR, systolic murmur does not increase with valsalva Abdomen:  Positive bowel sounds, no rebound no guarding Extremities:  Severe edema   LABS: Lab Results  Component Value Date   TROPONINI 0.46* 10/18/2015   Results for orders placed or performed during the hospital encounter of 10/17/15 (from the past 24 hour(s))  Heparin level (unfractionated)     Status: Abnormal   Collection Time: 10/20/15  4:01 PM  Result Value Ref Range   Heparin Unfractionated 0.27 (L) 0.30 - 0.70 IU/mL  Hemoglobin and hematocrit, blood     Status: Abnormal   Collection Time: 10/20/15  4:01 PM  Result Value Ref Range   Hemoglobin 8.3 (L) 13.0 - 17.0 g/dL   HCT 26.5 (L) 39.0 - 52.0 %  Glucose, capillary     Status: Abnormal   Collection Time: 10/20/15  9:24 PM  Result Value Ref Range   Glucose-Capillary 114 (H) 65 - 99 mg/dL  Heparin level (unfractionated)     Status: Abnormal   Collection Time: 10/21/15  1:38 AM  Result Value Ref Range   Heparin Unfractionated 0.17 (L) 0.30 - 0.70 IU/mL  Protime-INR     Status: Abnormal   Collection Time: 10/21/15  1:38 AM  Result Value Ref Range   Prothrombin Time 19.2 (H) 11.6 - 15.2 seconds   INR 1.61 (H) 0.00 - 1.49  Glucose, capillary     Status: Abnormal   Collection Time: 10/21/15  6:41 AM  Result Value Ref Range   Glucose-Capillary 126 (H) 65 - 99 mg/dL  Glucose, capillary     Status: Abnormal   Collection Time: 10/21/15 12:25 PM  Result Value Ref Range     Glucose-Capillary 158 (H) 65 - 99 mg/dL    Intake/Output Summary (Last 24 hours) at 10/21/15 1230 Last data filed at 10/21/15 0946  Gross per 24 hour  Intake 2993.42 ml  Output   1050 ml  Net 1943.42 ml     ASSESSMENT AND PLAN:  ANKLE PAIN:  Acute pain seems to be acute joint pain.  Question gout.  Check a uric acid level  CHF:    Weights are likely in accurate.  Not sure about I/Os.  Lots of edema .  Lots of PO intake.  I will reduce this.  He is net positive.  No evidence of dynamic outflow obstruction.  I will increase the diuresis.    ATRIAL FIB:  On heparin.  Need to restart Warfarin.    Maintaining NSR post cardioversion.   Continue amiodarone.  Obvious significant risks to anticoagulation but need to restart and try to keep INR 2 - 2.5  MITRAL VALVE REPAIR:  Stable.      DISPOSITION:  He will need SNF/rehab rather than going home at discharge.   SUBDURAL HEMATOMA.    Mental status OK.  Continue to follow  closely with anticoagulation.    Jeneen Rinks [facility] 10/21/2015 12:30 PM

## 2015-10-21 NOTE — Progress Notes (Signed)
Pharmacy Antibiotic Note  [name] is a 82 y.o. male admitted on 10/17/2015 with pneumonia.  Pharmacy has been consulted for Ceftriaxone and Vancomycin dosing. WBC elevated at 14.1. Afebrile. CrCl ~ 55 mL/min   Plan: -Vancomycin 1250 IV every 24 hours.  Goal trough 15-20 mcg/mL.  -Ceftriaxone 2 gm IV Q 24 hours -Monitor CBC, renal fx, cultures and clinical progress -VT at SS   Height: 5\' 10"  (177.8 cm) Weight: 200 lb 14.4 oz (91.128 kg) IBW/kg (Calculated) : 73  Temp (24hrs), Avg:97.9 F (36.6 C), Min:97.6 F (36.4 C), Max:98.3 F (36.8 C)   Recent Labs Lab 10/17/15 2057 10/18/15 0530 10/19/15 0440 10/20/15 0639  WBC 16.8* 14.1* 12.9* 10.7*  CREATININE 1.33* 1.23 1.24 1.12    Estimated Creatinine Clearance: 55.7 mL/min (by C-G formula based on Cr of 1.12).    No Known Allergies  Antimicrobials this admission: 3/15 Vanc>> 3/15 Ceftriaxone>>   Dose adjustments this admission: none  Microbiology results: None   Thank you for allowing pharmacy to be a part of this patient's care.  Bennye Alm, PharmD Pharmacy Resident [phone]

## 2015-10-21 NOTE — Progress Notes (Signed)
ANTICOAGULATION CONSULT NOTE   Pharmacy Consult for heparin  Indication: atrial fibrillation  No Known Allergies  Patient Measurements: Height: 5\' 10"  (177.8 cm) Weight: 221 lb 3.2 oz (100.336 kg) IBW/kg (Calculated) : 73 Heparin Dosing Weight: 93  Vital Signs: Temp: 98 F (36.7 C) (03/17 2014) Temp Source: Oral (03/17 2014) BP: 125/54 mmHg (03/17 2014) Pulse Rate: 72 (03/17 2014)  Labs:  Recent Labs  10/18/15 0530 10/18/15 0815 10/18/15 1354 10/19/15 0440  10/20/15 0639 10/20/15 1601 10/21/15 0138  HGB 9.5*  --   --  9.5*  --  8.5* 8.3*  --   HCT 28.0*  --   --  30.2*  --  26.8* 26.5*  --   PLT 185  --   --  208  --  186  --   --   LABPROT 46.5*  --   --  23.4*  --  19.8*  --  19.2*  INR 5.23*  --   --  2.10*  --  1.68*  --  1.61*  HEPARINUNFRC  --   --   --   --   < > 0.66 0.27* 0.17*  CREATININE 1.23  --   --  1.24  --  1.12  --   --   TROPONINI  --  0.41* 0.46*  --   --   --   --   --   < > = values in this interval not displayed.  Estimated Creatinine Clearance: 58.3 mL/min (by C-G formula based on Cr of 1.12).  Assessment: 82 yo with Afib, Coumadin on hold, for heparin  Goal of Therapy:  Heparin level= 0.3-0.5 Monitor platelets by anticoagulation protocol: Yes   Plan:   Increase Heparin 1750 units/hr Check heparin level in 8 hours.  Phillis Knack, PharmD, BCPS

## 2015-10-21 NOTE — Progress Notes (Signed)
      [address]       [place] [address]             [phone]      1 Day Post-Op Procedure(s) (LRB): CARDIOVERSION (N/A)   Subjective:  [name] states he is trying to sleep.  Also asks if he gets to go home today.  Objective: Vital signs in last 24 hours: Temp:  [97.5 F (36.4 C)-98.1 F (36.7 C)] 97.6 F (36.4 C) (03/18 0947) Pulse Rate:  [54-79] 79 (03/18 0947) Cardiac Rhythm:  [-] Normal sinus rhythm (03/17 1900) Resp:  [13-22] 20 (03/18 0947) BP: (87-125)/(54-88) 116/57 mmHg (03/18 0947) SpO2:  [94 %-100 %] 95 % (03/18 0947) Weight:  [200 lb 14.4 oz (91.128 kg)] 200 lb 14.4 oz (91.128 kg) (03/18 0602)  Intake/Output from previous day: 03/17 0701 - 03/18 0700 In: 2921.4 [P.O.:600; I.V.:1971.4; IV Piggyback:350] Out: 1450 [Urine:1450] Intake/Output this shift: Total I/O In: 120 [P.O.:120] Out: 200 [Urine:200]  General appearance: alert, cooperative and no distress Heart: regular rate and rhythm and + murmur Lungs: diminished breath sounds bibasilar Abdomen: soft, non-tender; bowel sounds normal; no masses,  no organomegaly Extremities: edema 3+ pitting Wound: clean and dry  Lab Results:  Recent Labs  10/19/15 0440 10/20/15 0639 10/20/15 1601  WBC 12.9* 10.7*  --   HGB 9.5* 8.5* 8.3*  HCT 30.2* 26.8* 26.5*  PLT 208 186  --    BMET:  Recent Labs  10/19/15 0440 10/20/15 0639  NA 136 136  K 4.4 4.0  CL 101 102  CO2 25 27  GLUCOSE 193* 155*  BUN 38* 32*  CREATININE 1.24 1.12  CALCIUM 8.3* 8.2*    PT/INR:  Recent Labs  10/21/15 0138  LABPROT 19.2*  INR 1.61*   ABG    Component Value Date/Time   PHART 7.339* 10/06/2015 0115   HCO3 20.7 10/06/2015 0115   TCO2 22 10/06/2015 1615   ACIDBASEDEF 5.0* 10/06/2015 0115   O2SAT 91.0 10/06/2015 0115   CBG (last 3)   Recent Labs  10/19/15 1639 10/20/15 2124 10/21/15 0641  GLUCAP 185* 114* 126*    Assessment/Plan: S/P Procedure(s) (LRB): CARDIOVERSION  (N/A)  1. CV- S/P MINI MV Repair 10/04/2015, Cardioversion done yesterday- currently in NSR- Cardiology following 2. Renal- remains hypervolemic, on Lasix 3. INR 1.61, Coumadin on hold for now 4. GU- urology consulted yesterday, foley to be removed this morning  5. Dispo- patient stable post cardioversion, maintaining NSR, continue current care per medicine   LOS: 4 days    Ellwood Handler 10/21/2015

## 2015-10-21 NOTE — Progress Notes (Signed)
[place] for heparin/warfarin Indication: atrial fibrillation  No Known Allergies  Patient Measurements: Height: 5\' 10"  (177.8 cm) Weight: 200 lb 14.4 oz (91.128 kg) IBW/kg (Calculated) : 73 Heparin Dosing Weight: 93  Vital Signs: Temp: 98.3 F (36.8 C) (03/18 1237) Temp Source: Oral (03/18 1237) BP: 116/53 mmHg (03/18 1237) Pulse Rate: 64 (03/18 1237)  Labs:  Recent Labs  10/18/15 1354  10/19/15 0440  10/20/15 0639 10/20/15 1601 10/21/15 0138  HGB  --   < > 9.5*  --  8.5* 8.3*  --   HCT  --   --  30.2*  --  26.8* 26.5*  --   PLT  --   --  208  --  186  --   --   LABPROT  --   --  23.4*  --  19.8*  --  19.2*  INR  --   --  2.10*  --  1.68*  --  1.61*  HEPARINUNFRC  --   --   --   < > 0.66 0.27* 0.17*  CREATININE  --   --  1.24  --  1.12  --   --   TROPONINI 0.46*  --   --   --   --   --   --   < > = values in this interval not displayed.  Estimated Creatinine Clearance: 55.7 mL/min (by C-G formula based on Cr of 1.12).  Assessment: 82 yo with Afib on Warfarin PTA. Per Cards restart Warfarin 3/18 with INR goal of 2-2.5. Heparin until INR > 2.  Hgb 8.3, Plts 186  INR subtherapeutic at 1.61 (Goal 2-2.5) HL therapeutic at 0.52 (goal 0.3-0.5)  PTA Warfarin Dose: 2 mg daily  -hematuria noted, chronic subdural hematoma on head CT on 3/15.  Per Cards MD continue to follow subdural hematoma closely with anticoagulation. No other s/sx of bleeding currently noted.   DDI with Warfarin = Amiodarone (PTA med)  Goal of Therapy:  Heparin level= 0.3-0.5 INR 2-2.5 Monitor platelets by anticoagulation protocol: Yes   Plan:   Warfarin 1 mg x 1  Continue Heparin 1750 units/hr Daily HL and CBC Daily INR Monitor for s/sx of bleeding  Bennye Alm, PharmD Pharmacy Resident [phone]

## 2015-10-22 DIAGNOSIS — I5031 Acute diastolic (congestive) heart failure: Secondary | ICD-10-CM

## 2015-10-22 LAB — CBC
HCT: 28.1 % — ABNORMAL LOW (ref 39.0–52.0)
Hemoglobin: 8.7 g/dL — ABNORMAL LOW (ref 13.0–17.0)
MCH: 29.1 pg (ref 26.0–34.0)
MCHC: 31 g/dL (ref 30.0–36.0)
MCV: 94 fL (ref 78.0–100.0)
PLATELETS: 178 10*3/uL (ref 150–400)
RBC: 2.99 MIL/uL — ABNORMAL LOW (ref 4.22–5.81)
RDW: 15.2 % (ref 11.5–15.5)
WBC: 11 10*3/uL — ABNORMAL HIGH (ref 4.0–10.5)

## 2015-10-22 LAB — BASIC METABOLIC PANEL
Anion gap: 8 (ref 5–15)
BUN: 27 mg/dL — AB (ref 6–20)
CALCIUM: 8 mg/dL — AB (ref 8.9–10.3)
CHLORIDE: 105 mmol/L (ref 101–111)
CO2: 24 mmol/L (ref 22–32)
CREATININE: 1 mg/dL (ref 0.61–1.24)
GFR calc Af Amer: >60 mL/min (ref >60)
GFR calc non Af Amer: >60 mL/min (ref >60)
GLUCOSE: 124 mg/dL — AB (ref 65–99)
Potassium: 3.8 mmol/L (ref 3.5–5.1)
Sodium: 137 mmol/L (ref 135–145)

## 2015-10-22 LAB — GLUCOSE, CAPILLARY
GLUCOSE-CAPILLARY: 151 mg/dL — AB (ref 65–99)
Glucose-Capillary: 162 mg/dL — ABNORMAL HIGH (ref 65–99)
Glucose-Capillary: 144 mg/dL — ABNORMAL HIGH (ref 65–99)

## 2015-10-22 LAB — PROTIME-INR
INR: 1.7 — ABNORMAL HIGH (ref 0.00–1.49)
PROTHROMBIN TIME: 20 s — AB (ref 11.6–15.2)

## 2015-10-22 LAB — HEPARIN LEVEL (UNFRACTIONATED): Heparin Unfractionated: 0.45 IU/mL (ref 0.30–0.70)

## 2015-10-22 MED ORDER — WARFARIN SODIUM 2 MG PO TABS
1.0000 mg | ORAL_TABLET | Freq: Once | ORAL | Status: AC
  Administered 2015-10-23: 1 mg via ORAL
  Filled 2015-10-22 (×2): qty 0.5

## 2015-10-22 MED ORDER — METOLAZONE 2.5 MG PO TABS
2.5000 mg | ORAL_TABLET | Freq: Every day | ORAL | Status: DC
  Administered 2015-10-22 – 2015-10-28 (×7): 2.5 mg via ORAL
  Filled 2015-10-22 (×7): qty 1

## 2015-10-22 NOTE — Consult Note (Signed)
WOC wound consult note Reason for Consult: right groin Wound type: surgical site Measurement: 6cm right groin Wound [id number], some skin necrosis at incision Drainage (amount, consistency, odor) unable to assess no dressing in place, no frank purulent drainage today Periwound: intact, some bruising  Dressing procedure/placement/frequency: Add silver hydrofiber to dry area and provide antimicrobial benefits.  Change daily.   Re consult if needed, will not follow at this time. Thanks  Longino Trefz Kellogg, [place] [phone])

## 2015-10-22 NOTE — Discharge Instructions (Addendum)
We ask the SNF to please do the following: 1. Please obtain vital signs at least one time daily 2.Please weigh the patient daily. If he or she continues to gain weight or develops lower extremity edema, contact the office at [phone]. 3. Ambulate patient at least three times daily and please use sternal precautions. 4. Apply dry gauze with tape to right groin and change at least two times daily. If patient develops drainage, redness, or fever, please call [phone].  Information on my medicine - Coumadin   (Warfarin)  This medication education was reviewed with me or my healthcare representative as part of my discharge preparation.  The pharmacist that spoke with me during my hospital stay was:  Kem Parkinson, [place]  Why was Coumadin prescribed for you? Coumadin was prescribed for you because you have a blood clot or a medical condition that can cause an increased risk of forming blood clots. Blood clots can cause serious health problems by blocking the flow of blood to the heart, lung, or brain. Coumadin can prevent harmful blood clots from forming. As a reminder your indication for Coumadin is:   Stroke Prevention Because Of Atrial Fibrillation  What test will check on my response to Coumadin? While on Coumadin (warfarin) you will need to have an INR test regularly to ensure that your dose is keeping you in the desired range. The INR (international normalized ratio) number is calculated from the result of the laboratory test called prothrombin time (PT).  If an INR APPOINTMENT HAS NOT ALREADY BEEN MADE FOR YOU please schedule an appointment to have this lab work done by your health care provider within 7 days. Your INR goal is usually a number between:  2 to 3 or your provider may give you a more narrow range like 2-2.5.  Ask your health care provider during an office visit what your goal INR is.  What  do you need to  know  About  COUMADIN? Take Coumadin (warfarin) exactly as  prescribed by your healthcare provider about the same time each day.  DO NOT stop taking without talking to the doctor who prescribed the medication.  Stopping without other blood clot prevention medication to take the place of Coumadin may increase your risk of developing a new clot or stroke.  Get refills before you run out.  What do you do if you miss a dose? If you miss a dose, take it as soon as you remember on the same day then continue your regularly scheduled regimen the next day.  Do not take two doses of Coumadin at the same time.  Important Safety Information A possible side effect of Coumadin (Warfarin) is an increased risk of bleeding. You should call your healthcare provider right away if you experience any of the following: ? Bleeding from an injury or your nose that does not stop. ? Unusual colored urine (red or dark brown) or unusual colored stools (red or black). ? Unusual bruising for unknown reasons. ? A serious fall or if you hit your head (even if there is no bleeding).  Some foods or medicines interact with Coumadin (warfarin) and might alter your response to warfarin. To help avoid this: ? Eat a balanced diet, maintaining a consistent amount of Vitamin K. ? Notify your provider about major diet changes you plan to make. ? Avoid alcohol or limit your intake to 1 drink for women and 2 drinks for men per day. (1 drink is 5 oz. wine,  12 oz. beer, or 1.5 oz. liquor.)  Make sure that ANY health care provider who prescribes medication for you knows that you are taking Coumadin (warfarin).  Also make sure the healthcare provider who is monitoring your Coumadin knows when you have started a new medication including herbals and non-prescription products.  Coumadin (Warfarin)  Major Drug Interactions  Increased Warfarin Effect Decreased Warfarin Effect  Alcohol (large quantities) Antibiotics (esp. Septra/Bactrim, Flagyl, Cipro) Amiodarone (Cordarone) Aspirin (ASA) Cimetidine  (Tagamet) Megestrol (Megace) NSAIDs (ibuprofen, naproxen, etc.) Piroxicam (Feldene) Propafenone (Rythmol SR) Propranolol (Inderal) Isoniazid (INH) Posaconazole (Noxafil) Barbiturates (Phenobarbital) Carbamazepine (Tegretol) Chlordiazepoxide (Librium) Cholestyramine (Questran) Griseofulvin Oral Contraceptives Rifampin Sucralfate (Carafate) Vitamin K   Coumadin (Warfarin) Major Herbal Interactions  Increased Warfarin Effect Decreased Warfarin Effect  Garlic Ginseng Ginkgo biloba Coenzyme Q10 Green tea St. Johns wort    Coumadin (Warfarin) FOOD Interactions  Eat a consistent number of servings per week of foods HIGH in Vitamin K (1 serving =  cup)  Collards (cooked, or boiled & drained) Kale (cooked, or boiled & drained) Mustard greens (cooked, or boiled & drained) Parsley *serving size only =  cup Spinach (cooked, or boiled & drained) Swiss chard (cooked, or boiled & drained) Turnip greens (cooked, or boiled & drained)  Eat a consistent number of servings per week of foods MEDIUM-HIGH in Vitamin K (1 serving = 1 cup)  Asparagus (cooked, or boiled & drained) Broccoli (cooked, boiled & drained, or raw & chopped) Brussel sprouts (cooked, or boiled & drained) *serving size only =  cup Lettuce, raw (green leaf, endive, romaine) Spinach, raw Turnip greens, raw & chopped   These websites have more information on Coumadin (warfarin):  FailFactory.se; VeganReport.com.au;

## 2015-10-22 NOTE — Progress Notes (Signed)
[place] for heparin/warfarin Indication: atrial fibrillation  No Known Allergies  Patient Measurements: Height: 5\' 10"  (177.8 cm) Weight: 202 lb 4.8 oz (91.763 kg) IBW/kg (Calculated) : 73 Heparin Dosing Weight: 93  Vital Signs: Temp: 97.5 F (36.4 C) (03/19 0752) Temp Source: Oral (03/19 0752) BP: 120/56 mmHg (03/19 0752) Pulse Rate: 68 (03/19 0609)  Labs:  Recent Labs  10/20/15 0639 10/20/15 1601 10/21/15 0138 10/21/15 1203 10/22/15 0350  HGB 8.5* 8.3*  --   --  8.7*  HCT 26.8* 26.5*  --   --  28.1*  PLT 186  --   --   --  178  LABPROT 19.8*  --  19.2*  --  20.0*  INR 1.68*  --  1.61*  --  1.70*  HEPARINUNFRC 0.66 0.27* 0.17* 0.52 0.45  CREATININE 1.12  --   --   --  1.00    Estimated Creatinine Clearance: 62.6 mL/min (by C-G formula based on Cr of 1).  Assessment: 82 yo with Afib on Warfarin PTA. Per Cards restart Warfarin 3/18 with INR goal of 2-2.5. Heparin until INR > 2.  Hgb 8.7, Plts 178 INR subtherapeutic at 1.70 (Goal 2-2.5)  HL therapeutic at 0.45 (goal 0.3-0.5)    PTA Warfarin Dose: 2 mg daily  -hematuria noted, chronic subdural hematoma on head CT on 3/15.  Per Cards MD continue to follow subdural hematoma closely with slow titration of warfarin  No other s/sx of bleeding currently noted.   DDI with Warfarin = Amiodarone (PTA med)  Goal of Therapy:  Heparin level= 0.3-0.5 INR 2-2.5 Monitor platelets by anticoagulation protocol: Yes   Plan:   Warfarin 1 mg x 1  Continue Heparin 1750 units/hr Daily HL and CBC Daily INR Monitor for s/sx of bleeding  Bennye Alm, PharmD Pharmacy Resident [phone]

## 2015-10-22 NOTE — Progress Notes (Addendum)
      [address]       [place], [address]             [phone]      2 Days Post-Op Procedure(s) (LRB): CARDIOVERSION (N/A)   Subjective:  Mr. Chan has no complaints this morning.   Objective: Vital signs in last 24 hours: Temp:  [97.5 F (36.4 C)-98.3 F (36.8 C)] 97.5 F (36.4 C) (03/19 0752) Pulse Rate:  [64-72] 68 (03/19 0609) Cardiac Rhythm:  [-] Normal sinus rhythm (03/18 2300) Resp:  [20] 20 (03/19 0609) BP: (109-123)/(45-56) 120/56 mmHg (03/19 0752) SpO2:  [94 %-95 %] 95 % (03/19 0752) Weight:  [202 lb 4.8 oz (91.763 kg)] 202 lb 4.8 oz (91.763 kg) (03/19 0609)  Intake/Output from previous day: 03/18 0701 - 03/19 0700 In: 1336.3 [P.O.:360; I.V.:676.3; IV Piggyback:300] Out: 750 [Urine:750] Intake/Output this shift: Total I/O In: 240 [P.O.:240] Out: 200 [Urine:200]  General appearance: alert, cooperative and no distress Heart: regular rate and rhythm and +murmur Lungs: clear to auscultation bilaterally Abdomen: soft, non-tender; bowel sounds normal; no masses,  no organomegaly Extremities: edema 2-3+ pitting Wound: R Groin wound with early dehiscence, + yellow slough present,   sterontomy/ mini thoracotomy incision are healing well  Lab Results:  Recent Labs  10/20/15 0639 10/20/15 1601 10/22/15 0350  WBC 10.7*  --  11.0*  HGB 8.5* 8.3* 8.7*  HCT 26.8* 26.5* 28.1*  PLT 186  --  178   BMET:  Recent Labs  10/20/15 0639 10/22/15 0350  NA 136 137  K 4.0 3.8  CL 102 105  CO2 27 24  GLUCOSE 155* 124*  BUN 32* 27*  CREATININE 1.12 1.00  CALCIUM 8.2* 8.0*    PT/INR:  Recent Labs  10/22/15 0350  LABPROT 20.0*  INR 1.70*   ABG    Component Value Date/Time   PHART 7.339* 10/06/2015 0115   HCO3 20.7 10/06/2015 0115   TCO2 22 10/06/2015 1615   ACIDBASEDEF 5.0* 10/06/2015 0115   O2SAT 91.0 10/06/2015 0115   CBG (last 3)   Recent Labs  10/21/15 0641 10/21/15 1225 10/21/15 1641  GLUCAP 126* 158* 135*     Assessment/Plan: S/P Procedure(s) (LRB): CARDIOVERSION (N/A)  1. CV- S/P Mini MV Repair, Cardioversion- maintaining NSR, Cardiology following 2. INR 1.70, coumadin restarted at 1 mg last night, with plans for slow titration due to subdural hematoma 3. Wound- R groin wound with early dehiscence, no drainage or erythema present- will get wound care consult, instructed nursing on dressing changes 4. Renal- creatinine ok, LE edema- on Lasix 5. Dispo- maintaining NSR, need to monitor R groin wound closely, coumadin slowly, plan per primary   LOS: 5 days    BARRETT, ERIN 10/22/2015  I have seen and examined the patient and agree with the assessment and plan as outlined.  Will need to watch groin wound closely.  Rexene Alberts, MD 10/22/2015 1:07 PM

## 2015-10-22 NOTE — Progress Notes (Signed)
SUBJECTIVE:   No complaints. Energy level is better. Still markedly edematous. Maintaining sinus rhythm after cardioversion. INR up to 1.7 today. Maintaining on IV heparin. H/H improved to 9 and 28. Creatinine 1. Net +1/2 L overnight on Lasix 80 mg IV twice a day (weight reported up to 202 from 200 lb).  PHYSICAL EXAM Filed Vitals:   10/21/15 1735 10/21/15 2049 10/22/15 0609 10/22/15 0752  BP: 123/56 113/54 109/45 120/56  Pulse: 68 72 68   Temp:  98.2 F (36.8 C) 98 F (36.7 C) 97.5 F (36.4 C)  TempSrc:  Oral Oral Oral  Resp:  20 20   Height:      Weight:   202 lb 4.8 oz (91.763 kg)   SpO2:  95% 94% 95%   General:  No acute distress Lungs:  Decreased breath sounds, mild crackles Heart:  RRR, systolic murmur does not increase with valsalva Abdomen:  Positive bowel sounds, no rebound no guarding Extremities:  3+ edema  LABS: Lab Results  Component Value Date   TROPONINI 0.46* 10/18/2015   Results for orders placed or performed during the hospital encounter of 10/17/15 (from the past 24 hour(s))  Heparin level (unfractionated)     Status: None   Collection Time: 10/21/15 12:03 PM  Result Value Ref Range   Heparin Unfractionated 0.52 0.30 - 0.70 IU/mL  Glucose, capillary     Status: Abnormal   Collection Time: 10/21/15 12:25 PM  Result Value Ref Range   Glucose-Capillary 158 (H) 65 - 99 mg/dL  Uric acid     Status: None   Collection Time: 10/21/15 12:57 PM  Result Value Ref Range   Uric Acid, Serum 7.0 4.4 - 7.6 mg/dL  Glucose, capillary     Status: Abnormal   Collection Time: 10/21/15  4:41 PM  Result Value Ref Range   Glucose-Capillary 135 (H) 65 - 99 mg/dL   Comment 1 Notify RN   Heparin level (unfractionated)     Status: None   Collection Time: 10/22/15  3:50 AM  Result Value Ref Range   Heparin Unfractionated 0.45 0.30 - 0.70 IU/mL  Basic metabolic panel     Status: Abnormal   Collection Time: 10/22/15  3:50 AM  Result Value Ref Range   Sodium 137 135 -  145 mmol/L   Potassium 3.8 3.5 - 5.1 mmol/L   Chloride 105 101 - 111 mmol/L   CO2 24 22 - 32 mmol/L   Glucose, Bld 124 (H) 65 - 99 mg/dL   BUN 27 (H) 6 - 20 mg/dL   Creatinine, Ser 1.00 0.61 - 1.24 mg/dL   Calcium 8.0 (L) 8.9 - 10.3 mg/dL   GFR calc non Af Amer >60 >60 mL/min   GFR calc Af Amer >60 >60 mL/min   Anion gap 8 5 - 15  CBC     Status: Abnormal   Collection Time: 10/22/15  3:50 AM  Result Value Ref Range   WBC 11.0 (H) 4.0 - 10.5 K/uL   RBC 2.99 (L) 4.22 - 5.81 MIL/uL   Hemoglobin 8.7 (L) 13.0 - 17.0 g/dL   HCT 28.1 (L) 39.0 - 52.0 %   MCV 94.0 78.0 - 100.0 fL   MCH 29.1 26.0 - 34.0 pg   MCHC 31.0 30.0 - 36.0 g/dL   RDW 15.2 11.5 - 15.5 %   Platelets 178 150 - 400 K/uL  Protime-INR     Status: Abnormal   Collection Time: 10/22/15  3:50 AM  Result Value  Ref Range   Prothrombin Time 20.0 (H) 11.6 - 15.2 seconds   INR 1.70 (H) 0.00 - 1.49    Intake/Output Summary (Last 24 hours) at 10/22/15 1001 Last data filed at 10/22/15 0944  Gross per 24 hour  Intake 1403.83 ml  Output    750 ml  Net 653.83 ml     ASSESSMENT AND PLAN:  ANKLE PAIN:  Improved today. The ankles are not warm or painful to manipulation.  Uric acid level 7 - although do not suspect gout  CHF:    Weights are likely in accurate.  Not sure about I/Os.  Lots of edema. Fluid restricted. He is net positive.  No evidence of dynamic outflow obstruction.  On lasix 80 mg IV BID. Add metolazone 2.5 mg daily.  ATRIAL FIB:  On heparin and warfarin. Bridge until therapeutic due to recent cardioversion.   Continue amiodarone.  Goal INR 2 - 2.5 (1.7 today)  MITRAL VALVE REPAIR:  Stable.      DISPOSITION:  He will need SNF/rehab rather than going home at discharge.   SUBDURAL HEMATOMA.    Mental status OK.  Continue to follow closely with anticoagulation.    Pixie Casino, MD, [facility] Attending Cardiologist [place] [facility] 10/22/2015 10:01 AM

## 2015-10-23 ENCOUNTER — Encounter: Payer: Medicare Other | Admitting: Thoracic Surgery (Cardiothoracic Vascular Surgery)

## 2015-10-23 DIAGNOSIS — I480 Paroxysmal atrial fibrillation: Secondary | ICD-10-CM

## 2015-10-23 DIAGNOSIS — I5033 Acute on chronic diastolic (congestive) heart failure: Secondary | ICD-10-CM

## 2015-10-23 LAB — URINALYSIS, ROUTINE W REFLEX MICROSCOPIC
BILIRUBIN URINE: NEGATIVE
Glucose, UA: NEGATIVE mg/dL
Ketones, ur: NEGATIVE mg/dL
NITRITE: NEGATIVE
PH: 6 (ref 5.0–8.0)
Protein, ur: NEGATIVE mg/dL
SPECIFIC GRAVITY, URINE: 1.01 (ref 1.005–1.030)

## 2015-10-23 LAB — PROTIME-INR
INR: 1.7 — AB (ref 0.00–1.49)
Prothrombin Time: 20 seconds — ABNORMAL HIGH (ref 11.6–15.2)

## 2015-10-23 LAB — GLUCOSE, CAPILLARY
GLUCOSE-CAPILLARY: 138 mg/dL — AB (ref 65–99)
Glucose-Capillary: 186 mg/dL — ABNORMAL HIGH (ref 65–99)
Glucose-Capillary: 153 mg/dL — ABNORMAL HIGH (ref 65–99)

## 2015-10-23 LAB — URINE MICROSCOPIC-ADD ON

## 2015-10-23 LAB — CBC
HEMATOCRIT: 28.3 % — AB (ref 39.0–52.0)
Hemoglobin: 8.8 g/dL — ABNORMAL LOW (ref 13.0–17.0)
MCH: 29.1 pg (ref 26.0–34.0)
MCHC: 31.1 g/dL (ref 30.0–36.0)
MCV: 93.7 fL (ref 78.0–100.0)
PLATELETS: 176 10*3/uL (ref 150–400)
RBC: 3.02 MIL/uL — ABNORMAL LOW (ref 4.22–5.81)
RDW: 15.1 % (ref 11.5–15.5)
WBC: 12.9 10*3/uL — ABNORMAL HIGH (ref 4.0–10.5)

## 2015-10-23 LAB — BASIC METABOLIC PANEL
Anion gap: 9 (ref 5–15)
BUN: 21 mg/dL — ABNORMAL HIGH (ref 6–20)
CALCIUM: 8.2 mg/dL — AB (ref 8.9–10.3)
CO2: 27 mmol/L (ref 22–32)
CREATININE: 0.97 mg/dL (ref 0.61–1.24)
Chloride: 101 mmol/L (ref 101–111)
GFR calc non Af Amer: >60 mL/min (ref >60)
Glucose, Bld: 133 mg/dL — ABNORMAL HIGH (ref 65–99)
Potassium: 2.9 mmol/L — ABNORMAL LOW (ref 3.5–5.1)
SODIUM: 137 mmol/L (ref 135–145)

## 2015-10-23 LAB — HEPARIN LEVEL (UNFRACTIONATED)
HEPARIN UNFRACTIONATED: 0.72 [IU]/mL — AB (ref 0.30–0.70)
Heparin Unfractionated: 0.58 IU/mL (ref 0.30–0.70)
Heparin Unfractionated: 0.57 IU/mL (ref 0.30–0.70)

## 2015-10-23 MED ORDER — POTASSIUM CHLORIDE CRYS ER 20 MEQ PO TBCR
40.0000 meq | EXTENDED_RELEASE_TABLET | Freq: Two times a day (BID) | ORAL | Status: DC
  Administered 2015-10-23 – 2015-10-27 (×9): 40 meq via ORAL
  Filled 2015-10-23 (×10): qty 2

## 2015-10-23 MED ORDER — COLCHICINE 0.6 MG PO TABS
0.6000 mg | ORAL_TABLET | Freq: Two times a day (BID) | ORAL | Status: DC
  Administered 2015-10-23 – 2015-10-28 (×11): 0.6 mg via ORAL
  Filled 2015-10-23 (×11): qty 1

## 2015-10-23 MED ORDER — POTASSIUM CHLORIDE CRYS ER 20 MEQ PO TBCR: 40.0000 meq | EXTENDED_RELEASE_TABLET | Freq: Once | ORAL | Status: DC

## 2015-10-23 MED ORDER — HEPARIN (PORCINE) IN NACL 100-0.45 UNIT/ML-% IJ SOLN
1450.0000 [IU]/h | INTRAMUSCULAR | Status: DC
  Administered 2015-10-23 – 2015-10-24 (×2): 1500 [IU]/h via INTRAVENOUS
  Filled 2015-10-23 (×4): qty 250

## 2015-10-23 MED ORDER — WARFARIN SODIUM 2 MG PO TABS
2.0000 mg | ORAL_TABLET | Freq: Once | ORAL | Status: AC
  Administered 2015-10-23: 2 mg via ORAL
  Filled 2015-10-23: qty 1

## 2015-10-23 MED ORDER — POTASSIUM CHLORIDE CRYS ER 20 MEQ PO TBCR
40.0000 meq | EXTENDED_RELEASE_TABLET | Freq: Once | ORAL | Status: AC
  Administered 2015-10-23: 40 meq via ORAL
  Filled 2015-10-23: qty 2

## 2015-10-23 NOTE — Progress Notes (Signed)
ANTICOAGULATION CONSULT NOTE - Follow Up Consult  Pharmacy Consult for heparin Indication: atrial fibrillation  No Known Allergies  Patient Measurements: Height: 5\' 10"  (177.8 cm) Weight: 202 lb 4.4 oz (91.752 kg) (Scale B) IBW/kg (Calculated) : 73 Heparin Dosing Weight: 93  Vital Signs: Temp: 98.1 F (36.7 C) (03/20 1500) Temp Source: Oral (03/20 1500) BP: 110/54 mmHg (03/20 1500) Pulse Rate: 65 (03/20 1500)  Labs:  Recent Labs  10/21/15 0138  10/22/15 0350 10/23/15 0533 10/23/15 1446  HGB  --   --  8.7* 8.8*  --   HCT  --   --  28.1* 28.3*  --   PLT  --   --  178 176  --   LABPROT 19.2*  --  20.0* 20.0*  --   INR 1.61*  --  1.70* 1.70*  --   HEPARINUNFRC 0.17*  < > 0.45 0.72* 0.58  CREATININE  --   --  1.00 0.97  --   < > = values in this interval not displayed.  Estimated Creatinine Clearance: 64.5 mL/min (by C-G formula based on Cr of 0.97).  Assessment: 17 yoM on heparin/warfarin for Afib Current heparin level is a little higher than the lower goal range for this patient. I expect it will continue to decrease without another decrease in the rate. Will check another level this evening and reassess for a rate change then.  -hematuria previously noted, chronic subdural hematoma on head CT on 3/15. Per Cards MD continue to follow subdural hematoma closely with slow titration of warfarin  No other s/sx of bleeding currently noted.    Goal of Therapy:  Heparin level 0.3-0.5 units/ml Monitor platelets by anticoagulation protocol: Yes   Plan:  Continue heparin at current rate of 1600 units/hr Recheck heparin level tonight   Thank you for allowing [place] to participate in this patients care. Jens Som, PharmD Pager: [phone] 10/23/2015,4:20 PM

## 2015-10-23 NOTE — Progress Notes (Addendum)
      [address]       [place] [address]             [phone]        3 Days Post-Op Procedure(s) (LRB): CARDIOVERSION (N/A)  Subjective: Patient upset this am as his dentures are dirty and he has nothing to clean them. Also, his right ankle hurts and is swollen.  Objective: Vital signs in last 24 hours: Temp:  [98.1 F (36.7 C)-98.4 F (36.9 C)] 98.3 F (36.8 C) (03/20 0700) Pulse Rate:  [66-72] 68 (03/20 0700) Cardiac Rhythm:  [-] Normal sinus rhythm (03/20 0752) Resp:  [16-20] 16 (03/20 0700) BP: (123-126)/(62-70) 126/70 mmHg (03/20 0700) SpO2:  [95 %-98 %] 98 % (03/20 0700) Weight:  [202 lb 4.4 oz (91.752 kg)] 202 lb 4.4 oz (91.752 kg) (03/20 0700)  Current Weight  10/23/15 202 lb 4.4 oz (91.752 kg)       Intake/Output from previous day: 03/19 0701 - 03/20 0700 In: 1050 [P.O.:720; I.V.:280; IV Piggyback:50] Out: 2125 [Urine:2125]   Physical Exam:  Cardiovascular: RRR, murmur Pulmonary: Slightly diminished at bases Abdomen: Soft, non tender, bowel sounds present. Extremities: ++/+++ bilateral lower extremity edema. Right ankle and leg more swollen than left Wounds: Sternal incision is clean and dry.  No erythema or signs of infection. Right groin wound superficially dehisced with slight yellow slough.  Lab Results: CBC:  Recent Labs  10/22/15 0350 10/23/15 0533  WBC 11.0* 12.9*  HGB 8.7* 8.8*  HCT 28.1* 28.3*  PLT 178 176   BMET:   Recent Labs  10/22/15 0350 10/23/15 0533  NA 137 137  K 3.8 2.9*  CL 105 101  CO2 24 27  GLUCOSE 124* 133*  BUN 27* 21*  CREATININE 1.00 0.97  CALCIUM 8.0* 8.2*    PT/INR:  Lab Results  Component Value Date   INR 1.70* 10/23/2015   INR 1.70* 10/22/2015   INR 1.61* 10/21/2015   ABG:  INR: Will add last result for INR, ABG once components are confirmed Will add last 4 CBG results once components are confirmed  Assessment/Plan:  1. CV - S/p mini MV repair on 10/04/2015. A fib.On  Amiodarone 200 mg daily and Lopressor 50 mg bid, and Coumadin 2.5 mg daily. On Heparin drip. INR remains 1.7 2. Volume Overload - On 80 mg IV Lasix BID and Zaroxolyn 2.5 mg daily 3.  Normocytic anemia - H and H stable at 8.8 and 28.3 4. Leukocytosis (WBC slightly increased to 12,900) with NO fever.  5. Right groin wound is superficially dehisced and slight yellow slough. Wound care following. WBC slowly increasing but also with right ankle pain 6. Supplement potassium 7. Right ankle pain-he has a lot of pain this am and is unable to weight bear. Uric acid a few days ago WNL. Questionable arthritis, gout. May need to check duplex US as right leg is more swollen than left.  [name],DONIELLE MPA-C 10/23/2015,8:58 AM   I have seen and examined the patient and agree with the assessment and plan as outlined.  Will start colchicine for likely gout flare.  Groin wound stable.   Rexene Alberts, MD 10/23/2015 9:24 AM

## 2015-10-23 NOTE — Care Management Important Message (Signed)
Important Message  Patient Details  Name: [name] MRN: [id number] Date of Birth: [birth year]   Medicare Important Message Given:  Yes    Barb Merino Bertram 10/23/2015, 3:38 PM

## 2015-10-23 NOTE — Progress Notes (Signed)
Physical Therapy Treatment Patient Details Name: [name] MRN: [id number] DOB: 12-08-1928 Today's Date: 10/23/2015    History of Present Illness Mr. Wlodarski is an 82 yo white male with chronic diastolic CHF, coronary artery disease , recurrent SVT s/p DCCV, pulmonary hypertension, type 2 diabetes mellitus, hyperlipidemia, and chronic back pain who recently underwent minimally invasive Mitral Valve Repair 10/04/15 presented with SOB and LE edema 10/17/15.     PT Comments    Pt admitted with above diagnosis. Pt presents with functional limitations due to generalized weakness, decreased balance, decreased functional activity tolerance, and R foot/lower leg pain. Pt needed Mod A +2 for bed mobility and refused to stand or ambulate today due to R foot/lower leg pain. Pt does not tolerate much movement of the RLE at all. Encouraged pt to at least try to stand with a walker but he declined. Educated pt on the importance of mobility and participation with therapies to be able to return home. Pt states that he could participate more one his leg pain is controlled. Pt will continue to benefit from PT services to increase functional mobility. Pt still would benefit from SNF rehab before returning home to decrease fall risk and decrease caregiver burden, but pt likely to refuse.    Follow Up Recommendations  SNF;Supervision/Assistance - 24 hour. HHPT if pt refuses SNF.      Equipment Recommendations  None recommended by PT    Recommendations for Other Services       Precautions / Restrictions Precautions Precautions: Fall;Sternal Restrictions Weight Bearing Restrictions: No    Mobility  Bed Mobility Overal bed mobility: Needs Assistance Bed Mobility: Sidelying to Sit;Sit to Supine Rolling: Min guard Sidelying to sit: Mod assist;+2 for physical assistance;HOB elevated   Sit to supine: Mod assist;HOB elevated   General bed mobility comments: Pt was able to bring his BLE's off the bed, but  needed assistance to scoot to the edge and bring his trunk upright.   Transfers Overall transfer level: Needs assistance Equipment used: None Transfers: Lateral/Scoot Transfers          Lateral/Scoot Transfers: Mod assist;+2 physical assistance General transfer comment: Mod A +2 to perform small scoots on the EOB towards the [facility]. Pt refused to stand due to RLE pain.   Ambulation/Gait             General Gait Details: Pt refused   Stairs            Wheelchair Mobility    Modified Rankin (Stroke Patients Only)       Balance Overall balance assessment: Needs assistance Sitting-balance support: No upper extremity supported;Feet supported Sitting balance-Leahy Scale: Good                              Cognition Arousal/Alertness: Awake/alert Behavior During Therapy: WFL for tasks assessed/performed Overall Cognitive Status: Within Functional Limits for tasks assessed                      Exercises      General Comments General comments (skin integrity, edema, etc.): Pt has +3 pitting edema below the knee on BLE's (R>L). Pt c/o of extreme pain with any movement or palpation of the R foot.       Pertinent Vitals/Pain Pain Assessment: Faces Faces Pain Scale: Hurts whole lot Pain Location: R LE Pain Descriptors / Indicators: Constant;Discomfort;Grimacing;Guarding Pain Intervention(s): Monitored during session;Limited activity within patient's tolerance;Repositioned  SpO2 94% on room air and HR 74 sitting EOB.     Home Living                      Prior Function            PT Goals (current goals can now be found in the care plan section) Acute Rehab PT Goals Patient Stated Goal: to go home PT Goal Formulation: With patient/family Time For Goal Achievement: 11/02/15 Potential to Achieve Goals: Fair Progress towards PT goals: Not progressing toward goals - comment (Limited by R foot pain)    Frequency  Min 3X/week     PT Plan Current plan remains appropriate    Co-evaluation             End of Session Equipment Utilized During Treatment: Gait belt Activity Tolerance: Patient limited by pain Patient left: in bed;with call bell/phone within reach;with bed alarm set;with family/visitor present     Time: [id number] PT Time Calculation (min) (ACUTE ONLY): 24 min  Charges:  $Therapeutic Activity: 8-22 mins                    G Codes:      Colon Branch, SPT Colon Branch 10/23/2015, 4:30 PM

## 2015-10-23 NOTE — Progress Notes (Signed)
ANTICOAGULATION CONSULT NOTE - Follow Up Consult  Pharmacy Consult for heparin Indication: atrial fibrillation  No Known Allergies  Patient Measurements: Height: 5\' 10"  (177.8 cm) Weight: 202 lb 4.4 oz (91.752 kg) (Scale B) IBW/kg (Calculated) : 73 Heparin Dosing Weight: 93  Vital Signs: Temp: 98.7 F (37.1 C) (03/20 1941) Temp Source: Oral (03/20 1941) BP: 113/60 mmHg (03/20 1941) Pulse Rate: 72 (03/20 1941)  Labs:  Recent Labs  10/21/15 0138  10/22/15 0350 10/23/15 0533 10/23/15 1446 10/23/15 2030  HGB  --   --  8.7* 8.8*  --   --   HCT  --   --  28.1* 28.3*  --   --   PLT  --   --  178 176  --   --   LABPROT 19.2*  --  20.0* 20.0*  --   --   INR 1.61*  --  1.70* 1.70*  --   --   HEPARINUNFRC 0.17*  < > 0.45 0.72* 0.58 0.57  CREATININE  --   --  1.00 0.97  --   --   < > = values in this interval not displayed.  Estimated Creatinine Clearance: 64.5 mL/min (by C-G formula based on Cr of 0.97).  Assessment: 58 yoM on heparin/warfarin for Afib Current heparin level is a little higher than goal for this patient. Will decrease rate and follow up with another 8 hour level.  -hematuria previously noted, chronic subdural hematoma on head CT on 3/15. Per Cards MD continue to follow subdural hematoma closely with slow titration of warfarin     Goal of Therapy:  Heparin level 0.3-0.5 units/ml Monitor platelets by anticoagulation protocol: Yes   Plan:  Decrease heparin to 1500 units/hr Check anti-Xa level in 8 hours and daily while on heparin Continue to monitor H&H and platelets   Thank you for allowing [place] to participate in this patients care. Jens Som, PharmD Pager: [phone] 10/23/2015,9:19 PM

## 2015-10-23 NOTE — Progress Notes (Signed)
[place] for heparin/warfarin Indication: atrial fibrillation  No Known Allergies  Patient Measurements: Height: 5\' 10"  (177.8 cm) Weight: 202 lb 4.4 oz (91.752 kg) (Scale B) IBW/kg (Calculated) : 73 Heparin Dosing Weight: 93  Vital Signs: Temp: 98.3 F (36.8 C) (03/20 0700) Temp Source: Oral (03/20 0700) BP: 126/70 mmHg (03/20 0700) Pulse Rate: 68 (03/20 0700)  Labs:  Recent Labs  10/20/15 1601 10/21/15 0138 10/21/15 1203 10/22/15 0350 10/23/15 0533  HGB 8.3*  --   --  8.7* 8.8*  HCT 26.5*  --   --  28.1* 28.3*  PLT  --   --   --  178 176  LABPROT  --  19.2*  --  20.0* 20.0*  INR  --  1.61*  --  1.70* 1.70*  HEPARINUNFRC 0.27* 0.17* 0.52 0.45 0.72*  CREATININE  --   --   --  1.00 0.97    Estimated Creatinine Clearance: 64.5 mL/min (by C-G formula based on Cr of 0.97).  Assessment: 82 yo with Afib on Warfarin PTA. Per Cards restart Warfarin 3/18 with INR goal of 2-2.5. Heparin until INR > 2.  No bleeding noted, Hb stable, pltc stable INR subtherapeutic at 1.70 (Goal 2-2.5)  HL supratherapeutic at 0.72 (goal 0.3-0.5) - verified was drawn from opposite arm of infusion and rate in pump correct  PTA Warfarin Dose: 2 mg daily  -hematuria previously noted, chronic subdural hematoma on head CT on 3/15.  Per Cards MD continue to follow subdural hematoma closely with slow titration of warfarin  No other s/sx of bleeding currently noted.   DDI with Warfarin = Amiodarone (PTA med)  Goal of Therapy:  Heparin level 0.3-0.5 INR 2-2.5 Monitor platelets by anticoagulation protocol: Yes   Plan:   Warfarin 2 mg x 1  Hold heparin for 15 min and resume at 1600 units/hr 6 hr HL Daily HL and CBC Daily INR Monitor for s/sx of bleeding  [facility], [place].D., BCPS Clinical Pharmacist Pager: [phone] 10/23/2015 8:22 AM

## 2015-10-23 NOTE — Progress Notes (Signed)
Subjective:  Pt denies CP  Breathing is fair   Objective: Filed Vitals:   10/22/15 1240 10/22/15 2241 10/23/15 0700 10/23/15 1150  BP: 123/62 124/67 126/70 95/72  Pulse: 66 72 68 69  Temp: 98.1 F (36.7 C) 98.4 F (36.9 C) 98.3 F (36.8 C) 98.6 F (37 C)  TempSrc: Oral Oral Oral Oral  Resp: 20 18 16 16   Height:      Weight:   202 lb 4.4 oz (91.752 kg)   SpO2: 95% 96% 98% 96%   Weight change: -0.4 oz (-0.011 kg)  Intake/Output Summary (Last 24 hours) at 10/23/15 1310 Last data filed at 10/23/15 1151  Gross per 24 hour  Intake    910 ml  Output   2325 ml  Net  -1415 ml   Net neg 1.37 L    General: Alert, awake, oriented x3, in no acute distress Neck:  JVP is normal Heart: Regular rate and rhythm, II/VI systolic murmur LSB  , rubs, gallops.  Lungs:Rhonchi R base Abdomen  Mild diffuse tenderness Exemities:  2+ edema  LLE  1+ RLE  .   Neuro: Grossly intact, nonfocal.  Tele: SR Lab Results: Results for orders placed or performed during the hospital encounter of 10/17/15 (from the past 24 hour(s))  Glucose, capillary     Status: Abnormal   Collection Time: 10/22/15  4:46 PM  Result Value Ref Range   Glucose-Capillary 144 (H) 65 - 99 mg/dL   Comment 1 Notify RN   Glucose, capillary     Status: Abnormal   Collection Time: 10/22/15 10:46 PM  Result Value Ref Range   Glucose-Capillary 151 (H) 65 - 99 mg/dL  Heparin level (unfractionated)     Status: Abnormal   Collection Time: 10/23/15  5:33 AM  Result Value Ref Range   Heparin Unfractionated 0.72 (H) 0.30 - 0.70 IU/mL  CBC     Status: Abnormal   Collection Time: 10/23/15  5:33 AM  Result Value Ref Range   WBC 12.9 (H) 4.0 - 10.5 K/uL   RBC 3.02 (L) 4.22 - 5.81 MIL/uL   Hemoglobin 8.8 (L) 13.0 - 17.0 g/dL   HCT 28.3 (L) 39.0 - 52.0 %   MCV 93.7 78.0 - 100.0 fL   MCH 29.1 26.0 - 34.0 pg   MCHC 31.1 30.0 - 36.0 g/dL   RDW 15.1 11.5 - 15.5 %   Platelets 176 150 - 400 K/uL  Basic metabolic panel     Status:  Abnormal   Collection Time: 10/23/15  5:33 AM  Result Value Ref Range   Sodium 137 135 - 145 mmol/L   Potassium 2.9 (L) 3.5 - 5.1 mmol/L   Chloride 101 101 - 111 mmol/L   CO2 27 22 - 32 mmol/L   Glucose, Bld 133 (H) 65 - 99 mg/dL   BUN 21 (H) 6 - 20 mg/dL   Creatinine, Ser 0.97 0.61 - 1.24 mg/dL   Calcium 8.2 (L) 8.9 - 10.3 mg/dL   GFR calc non Af Amer >60 >60 mL/min   GFR calc Af Amer >60 >60 mL/min   Anion gap 9 5 - 15  Protime-INR     Status: Abnormal   Collection Time: 10/23/15  5:33 AM  Result Value Ref Range   Prothrombin Time 20.0 (H) 11.6 - 15.2 seconds   INR 1.70 (H) 0.00 - 1.49  Glucose, capillary     Status: Abnormal   Collection Time: 10/23/15 10:54 AM  Result Value Ref Range  Glucose-Capillary 186 (H) 65 - 99 mg/dL    Studies/Results: No results found.  Medications: Reviewed   @PROBHOSP @  1  CHF Acute diastolic CHF  Pt still with evid of continued volume overload  Nursing says pt is drinking more fluid than written for  Family instructed on not bringing in other fluids, snacks Conntione IV diuresis    2  Atrial fib  S/p cardioversion  On heparin then coumatin   2  RLE swelling  Agree with trial of colchicine with elevated uric acid  3  MV dz  S/p valve repair  4.  CAD  S/p DES to LAD and RCA in 2016    5  HL  Continue statin  5  Anemia  Hgb 8.8  Continue to follow closely in anticoagulation     LOS: 6 days   Dorris Carnes 10/23/2015, 1:10 PM

## 2015-10-24 ENCOUNTER — Ambulatory Visit: Payer: Self-pay | Admitting: *Deleted

## 2015-10-24 LAB — CBC
HEMATOCRIT: 27.6 % — AB (ref 39.0–52.0)
HEMOGLOBIN: 8.5 g/dL — AB (ref 13.0–17.0)
MCH: 28.7 pg (ref 26.0–34.0)
MCHC: 30.8 g/dL (ref 30.0–36.0)
MCV: 93.2 fL (ref 78.0–100.0)
Platelets: 186 10*3/uL (ref 150–400)
RBC: 2.96 MIL/uL — ABNORMAL LOW (ref 4.22–5.81)
RDW: 15 % (ref 11.5–15.5)
WBC: 12.2 10*3/uL — ABNORMAL HIGH (ref 4.0–10.5)

## 2015-10-24 LAB — BASIC METABOLIC PANEL
Anion gap: 9 (ref 5–15)
BUN: 19 mg/dL (ref 6–20)
CHLORIDE: 99 mmol/L — AB (ref 101–111)
CO2: 30 mmol/L (ref 22–32)
CREATININE: 1 mg/dL (ref 0.61–1.24)
Calcium: 8.3 mg/dL — ABNORMAL LOW (ref 8.9–10.3)
GFR calc Af Amer: >60 mL/min (ref >60)
GFR calc non Af Amer: >60 mL/min (ref >60)
GLUCOSE: 144 mg/dL — AB (ref 65–99)
Potassium: 3.4 mmol/L — ABNORMAL LOW (ref 3.5–5.1)
SODIUM: 138 mmol/L (ref 135–145)

## 2015-10-24 LAB — HEPARIN LEVEL (UNFRACTIONATED): Heparin Unfractionated: 0.46 IU/mL (ref 0.30–0.70)

## 2015-10-24 LAB — PROTIME-INR
INR: 1.74 — ABNORMAL HIGH (ref 0.00–1.49)
Prothrombin Time: 20.4 seconds — ABNORMAL HIGH (ref 11.6–15.2)

## 2015-10-24 MED ORDER — JUVEN PO PACK
1.0000 | PACK | Freq: Two times a day (BID) | ORAL | Status: DC
  Administered 2015-10-25: 1 via ORAL
  Filled 2015-10-24 (×7): qty 1

## 2015-10-24 MED ORDER — WARFARIN SODIUM 2 MG PO TABS
2.0000 mg | ORAL_TABLET | Freq: Once | ORAL | Status: AC
  Administered 2015-10-24: 2 mg via ORAL
  Filled 2015-10-24: qty 1

## 2015-10-24 MED ORDER — ENSURE ENLIVE PO LIQD
237.0000 mL | Freq: Two times a day (BID) | ORAL | Status: DC
  Administered 2015-10-25 – 2015-10-31 (×11): 237 mL via ORAL

## 2015-10-24 NOTE — NC FL2 (Cosign Needed)
[place] LEVEL OF CARE SCREENING TOOL     IDENTIFICATION  Patient Name: [name] Birthdate: [birth year] Sex: male Admission Date (Current Location): 10/17/2015  [facility] and [place] Number:  Herbalist and Address:  [facility], [place] [address] [place], [place] [address]      Provider Number: [id number]  Attending Physician Name and Address:  Soyla Dryer, MD  Relative Name and Phone Number:       Current Level of Care: Hospital Recommended Level of Care: [place] Prior Approval Number:    Date Approved/Denied: 10/24/15 PASRR Number: [id number]  Discharge Plan: SNF    Current Diagnoses: Patient Active Problem List   Diagnosis Date Noted  . Persistent atrial fibrillation ([place])   . Pressure ulcer 10/19/2015  . Long term (current) use of anticoagulants [Z79.01] 10/17/2015  . CHF (congestive heart failure) ([place]) 10/17/2015  . Heart failure ([place]) 10/17/2015  . S/P minimally invasive mitral valve repair 10/04/2015  . Hypertensive left ventricular hypertrophy with heart failure ([place]) 10/04/2015  . Physical deconditioning   . Hypertensive cardiomyopathy ([place])   . Hemangioma of liver 08/02/2015  . Chronic diastolic (congestive) heart failure ([place])   . Diabetes mellitus, type II ([place])   . Mitral regurgitation   . Coronary artery disease   . Pulmonary HTN ([place])   . Lower extremity edema   . HLD (hyperlipidemia)   . SVT (supraventricular tachycardia) ([place]) 06/04/2015  . Type II diabetes mellitus, uncontrolled ([place]) 03/30/2015  . Severe mitral regurgitation 11/17/2014  . History of PSVT (paroxysmal supraventricular tachycardia) 11/15/2014  . Chronic renal disease, stage III 11/15/2014  . Dyslipidemia 11/15/2014  . History of prostate cancer 11/15/2014  . Dizziness and giddiness 10/20/2013  . Dry eye 09/03/2013  . Dyspnea on exertion 06/29/2013  . Peripheral edema 06/16/2013  . Pulmonary HTN ([place])  02/12/2013  . TBI (traumatic brain injury) ([place]) 11/25/2012  . Orbit fracture ([place]) 11/25/2012  . Maxillary sinus fracture ([place]) 11/25/2012  . Sinusitis 11/25/2012  . Frequent falls 11/25/2012  . OA (osteoarthritis)   . Metabolic syndrome   . ED (erectile dysfunction)   . Chronic fatigue   . Gout   . Bowing of leg   . Spinal stenosis   . Hypertension 05/07/2012  . Dysphagia 05/07/2012    Orientation RESPIRATION BLADDER Height & Weight     Self, Time, Situation, Place  Normal Incontinent Weight: 224 lb 1.6 oz (101.651 kg) Height:  5\' 10"  (177.8 cm)  BEHAVIORAL SYMPTOMS/MOOD NEUROLOGICAL BOWEL NUTRITION STATUS      Continent Diet  AMBULATORY STATUS COMMUNICATION OF NEEDS Skin   Extensive Assist Verbally Surgical wounds, Other (Comment) (incisions)                       Personal Care Assistance Level of Assistance  Bathing, Feeding, Dressing Bathing Assistance: Maximum assistance Feeding assistance: Independent Dressing Assistance: Maximum assistance     Functional Limitations Info  Sight, Hearing, Speech Sight Info: Adequate Hearing Info: Adequate Speech Info: Impaired    SPECIAL CARE FACTORS FREQUENCY  PT (By licensed PT), OT (By licensed OT)                    Contractures      Additional Factors Info  Code Status, Allergies Code Status Info: FULL Allergies Info: No Known Allergies           Current Medications (10/24/2015):  This is the  current hospital active medication list Current Facility-Administered Medications  Medication Dose Route Frequency Provider Last Rate Last Dose  . 0.9 %  sodium chloride infusion  250 mL Intravenous PRN Soyla Dryer, MD 10 mL/hr at 10/20/15 1238 500 mL at 10/20/15 1238  . acetaminophen (TYLENOL) tablet 1,000 mg  1,000 mg Oral Q6H PRN Soyla Dryer, MD   1,000 mg at 10/22/15 1822  . amiodarone (PACERONE) tablet 200 mg  200 mg Oral Daily Soyla Dryer, MD   200 mg at 10/24/15 1049  . aspirin EC tablet  81 mg  81 mg Oral Daily Soyla Dryer, MD   81 mg at 10/24/15 1049  . cefTRIAXone (ROCEPHIN) 2 g in dextrose 5 % 50 mL IVPB  2 g Intravenous Q24H Darnell Level Mancheril, RPH   2 g at 10/23/15 1757  . colchicine tablet 0.6 mg  0.6 mg Oral BID Nani Skillern, PA-C   0.6 mg at 10/24/15 1049  . feeding supplement (ENSURE ENLIVE) (ENSURE ENLIVE) liquid 237 mL  237 mL Oral BID BM Soyla Dryer, MD   237 mL at 10/24/15 1000  . finasteride (PROSCAR) tablet 5 mg  5 mg Oral Daily Soyla Dryer, MD   5 mg at 10/24/15 1049  . furosemide (LASIX) injection 80 mg  80 mg Intravenous BID [name], MD   80 mg at 10/24/15 1049  . heparin ADULT infusion 100 units/mL (25000 units/250 mL)  1,500 Units/hr Intravenous Continuous Jens Som, RPH 15 mL/hr at 10/23/15 2356 1,500 Units/hr at 10/23/15 2356  . metolazone (ZAROXOLYN) tablet 2.5 mg  2.5 mg Oral Daily Dorris Carnes V, MD   2.5 mg at 10/24/15 1049  . metoprolol (LOPRESSOR) tablet 50 mg  50 mg Oral BID Rexene Alberts, MD   50 mg at 10/24/15 1049  . multivitamin with minerals tablet 1 tablet  1 tablet Oral Daily Soyla Dryer, MD   1 tablet at 10/24/15 1049  . ondansetron (ZOFRAN) injection 4 mg  4 mg Intravenous Q6H PRN Soyla Dryer, MD      . potassium chloride SA (K-DUR,KLOR-CON) CR tablet 40 mEq  40 mEq Oral BID Rogelia Mire, NP   40 mEq at 10/24/15 1049  . pravastatin (PRAVACHOL) tablet 40 mg  40 mg Oral q1800 Soyla Dryer, MD   40 mg at 10/23/15 1756  . sodium chloride flush (NS) 0.9 % injection 3 mL  3 mL Intravenous Q12H Soyla Dryer, MD   3 mL at 10/23/15 1000  . sodium chloride flush (NS) 0.9 % injection 3 mL  3 mL Intravenous PRN Soyla Dryer, MD      . tamsulosin [facility]) capsule 0.4 mg  0.4 mg Oral Daily Soyla Dryer, MD   0.4 mg at 10/24/15 1049  . warfarin (COUMADIN) tablet 2 mg  2 mg Oral ONCE-1800 [place], [facility]      . Warfarin - Pharmacist Dosing Inpatient   Does not apply q1800 Kem Parkinson, RPH   0  at 10/21/15 1800     Discharge Medications: Please see discharge summary for a list of discharge medications.  Relevant Imaging Results:  Relevant Lab Results:   Additional Information SS#: [id number]  Raymondo Band, LCSW

## 2015-10-24 NOTE — Progress Notes (Signed)
Nutrition Follow-up  DOCUMENTATION CODES:   Obesity unspecified  INTERVENTION:  Continue Ensure Enlive po BID, each supplement provides 350 kcal and 20 grams of protein Continue Multivitamin with minerals daily Provide Juven BID with meals, each packet provides 80 kcal and 14 grams of amino acids  Recommend providing 500 mg of Vitamin C BID  Recommend pt take Omega-3 supplement   NUTRITION DIAGNOSIS:   Malnutrition related to chronic illness as evidenced by mild depletion of body fat, mild depletion of muscle mass.  Ongoing  GOAL:   Patient will meet greater than or equal to 90% of their needs  Unmet  MONITOR:   PO intake, Supplement acceptance, Labs, Weight trends, Skin, I & O's  REASON FOR ASSESSMENT:   Malnutrition Screening Tool    ASSESSMENT:   82 yo M with h/o mitral valve repair on 10/04/15 who presents with shortness of breath and lower extremity edema. Hx CHF, admitted with fluid overload. Patient with confusion and minimal intake at home since discharge from hospital on 3/10.   Pt states that he is eating better. Per nursing notes, pt is eating 25% of most meals, 65-100% of some. He states that he is drinking Ensure twice daily, but only received it once today.  Per nursing notes, pt has been refusing Ensure Enlive about 50% of the time. RD emphasized the importance of eating well and drinking Ensure to get adequate protein. Pt's weight is up 4 lbs from last week, but yesterday, weight was down 22 lbs from today. Pt continues to appear malnourished. RD recommended additional supplementation of amino acids, vitamin C, and omega-3 to promote wound healing.   Labs: low hemoglobin, low calcium  Diet Order:  Diet Heart Room service appropriate?: Yes; Fluid consistency:: Thin  Skin:  Wound (see comment) (Stage II pressure ulcer to buttocks; open area pressure ulcer to right groin)  Last BM:  3/18  Height:   Ht Readings from Last 1 Encounters:  10/18/15 5\' 10"   (1.778 m)    Weight:   Wt Readings from Last 1 Encounters:  10/24/15 224 lb 1.6 oz (101.651 kg)    Ideal Body Weight:  75.5 kg  BMI:  Body mass index is 32.15 kg/(m^2).  Estimated Nutritional Needs:   Kcal:  2000-2200  Protein:  110-130 gm  Fluid:  1.8 L  EDUCATION NEEDS:   No education needs identified at this time  [place], LDN Inpatient Clinical Dietitian Pager: [phone] After Hours Pager: [phone]

## 2015-10-24 NOTE — Progress Notes (Signed)
[place] for heparin/warfarin Indication: atrial fibrillation  No Known Allergies  Patient Measurements: Height: 5\' 10"  (177.8 cm) Weight: 224 lb 1.6 oz (101.651 kg) IBW/kg (Calculated) : 73 Heparin Dosing Weight: 93  Vital Signs:    Labs:  Recent Labs  10/22/15 0350 10/23/15 0533 10/23/15 1446 10/23/15 2030 10/24/15 0312  HGB 8.7* 8.8*  --   --  8.5*  HCT 28.1* 28.3*  --   --  27.6*  PLT 178 176  --   --  186  LABPROT 20.0* 20.0*  --   --  20.4*  INR 1.70* 1.70*  --   --  1.74*  HEPARINUNFRC 0.45 0.72* 0.58 0.57 0.46  CREATININE 1.00 0.97  --   --  1.00    Estimated Creatinine Clearance: 65.7 mL/min (by C-G formula based on Cr of 1).  Assessment: 83 yo with Afib on Warfarin PTA. Per Cards restart Warfarin 3/18 with INR goal of 2-2.5. Heparin until INR > 2.  No bleeding noted, Hb stable, pltc stable INR subtherapeutic at 1.74 (Goal 2-2.5)  HL therapeutic at 0.46 (goal 0.3-0.5)   PTA Warfarin Dose: 2 mg daily  -hematuria previously noted, chronic subdural hematoma on head CT on 3/15.  Per Cards MD continue to follow subdural hematoma closely with slow titration of warfarin  No other s/sx of bleeding currently noted.   DDI with Warfarin = Amiodarone (PTA med)  Goal of Therapy:  Heparin level 0.3-0.5 INR 2-2.5 Monitor platelets by anticoagulation protocol: Yes   Plan:   Warfarin 2 mg x 1  Heparin at 1500 units/hr Daily HL and CBC Daily INR Monitor for s/sx of bleeding  [facility], [name].D., BCPS Clinical Pharmacist Pager: [phone] 10/24/2015 12:06 PM

## 2015-10-24 NOTE — Clinical Social Work Note (Signed)
Clinical Social Work Assessment  Patient Details  Name: [name] MRN: [id number] Date of Birth: Nov 16, 1928  Date of referral:  10/24/15               Reason for consult:  Facility Placement                Permission sought to share information with:  Case Manager, Family Supports Permission granted to share information::  Yes, Verbal Permission Granted  Name::     [place]::     Relationship::  Spouse  Contact Information:  [phone]  Housing/Transportation Living arrangements for the past 2 months:  [place] of Information:  Patient, Spouse Patient Interpreter Needed:  None Criminal Activity/Legal Involvement Pertinent to Current Situation/Hospitalization:  No - Comment as needed Significant Relationships:  Spouse Lives with:  Spouse Do you feel safe going back to the place where you live?  Yes Need for family participation in patient care:  Yes (Comment)  Care giving concerns:  Spouse Inez Catalina reports she and the patient are agreeable with short term rehab for the patient before returning home.    Social Worker assessment / plan:  CSW received consult by MD. CSW went to speak with patient regarding the possible need for short term rehab. CSW introduced self and acknowledged the patient. Patient is alert and orientedx4. Patient was calm and cooperative with CSW assessment. Patient's spouse Inez Catalina was at the bedside. Patient provided CSW with permission to speak while family is in room. CSW informed patient of PT recommendation for short term rehab. Patient and family is agreeable to SNF placement. CSW was provided permission to fax clinical information out to the facilities in [employer]. Patient and family's preference is [facility]/field seismologist. CSW to complete FL2 for MD signature. CSW to initiate SNF placement process.    Employment status:  Disabled (Comment on whether or not currently receiving Disability) Insurance information:  Managed  Medicare PT Recommendations:  [place] / Referral to community resources:     Patient/Family's Response to care:  Patient and spouse is agreeable to disposition plan.   Patient/Family's Understanding of and Emotional Response to Diagnosis, Current Treatment, and Prognosis:  Patient and spouse is aware and understanding of current treatment and prognosis. Spouse Inez Catalina reports the patient would definitely benefit most by gaining a little more strength before returning.   Emotional Assessment Appearance:  Appears stated age Attitude/Demeanor/Rapport:   (Calm and Cooperative ) Affect (typically observed):  Blunt, Appropriate Orientation:  Oriented to Self, Oriented to Place, Oriented to  Time, Oriented to Situation Alcohol / Substance use:  Not Applicable Psych involvement (Current and /or in the community):  No (Comment)  Discharge Needs  Concerns to be addressed:  Discharge Planning Concerns Readmission within the last 30 days:  Yes Current discharge risk:  Physical Impairment Barriers to Discharge:  Continued Medical Work up   [employer], LCSW 10/24/2015, 3:17 PM

## 2015-10-24 NOTE — Progress Notes (Signed)
Subjective: Pt feeling better this AM  Breathing is better  Ankle is improving   Objective: Filed Vitals:   10/23/15 1500 10/23/15 1941 10/23/15 2357 10/24/15 0700  BP: 110/54 113/60 116/60   Pulse: 65 72 74   Temp: 98.1 F (36.7 C) 98.7 F (37.1 C)    TempSrc: Oral Oral    Resp: 17 16    Height:      Weight:    224 lb 1.6 oz (101.651 kg)  SpO2: 95% 96%     Weight change: 21 lb 13.2 oz (9.899 kg)  Intake/Output Summary (Last 24 hours) at 10/24/15 [id number] Last data filed at 10/24/15 0700  Gross per 24 hour  Intake    480 ml  Output   2525 ml  Net  -2045 ml   I/O  3.16 L negatvie    General: Alert, awake, oriented x3, in no acute distress Neck:  JVP is normal Heart: Regular rate and rhythm, Gr II/Vi systolic murmur LSB , rubs, gallops.  Lungs: Clear to auscultation.  No rales or wheezes. Exemities:  1+ edena R greater than L     Neuro: Grossly intact, nonfocal.  Tele:  SR    Lab Results: Results for orders placed or performed during the hospital encounter of 10/17/15 (from the past 24 hour(s))  Glucose, capillary     Status: Abnormal   Collection Time: 10/23/15 10:54 AM  Result Value Ref Range   Glucose-Capillary 186 (H) 65 - 99 mg/dL  Heparin level (unfractionated)     Status: None   Collection Time: 10/23/15  2:46 PM  Result Value Ref Range   Heparin Unfractionated 0.58 0.30 - 0.70 IU/mL  Glucose, capillary     Status: Abnormal   Collection Time: 10/23/15  4:33 PM  Result Value Ref Range   Glucose-Capillary 138 (H) 65 - 99 mg/dL  Urinalysis, Routine w reflex microscopic (not at [facility])     Status: Abnormal   Collection Time: 10/23/15  7:22 PM  Result Value Ref Range   Color, Urine AMBER (A) YELLOW   APPearance CLOUDY (A) CLEAR   Specific Gravity, Urine 1.010 1.005 - 1.030   pH 6.0 5.0 - 8.0   Glucose, UA NEGATIVE NEGATIVE mg/dL   Hgb urine dipstick LARGE (A) NEGATIVE   Bilirubin Urine NEGATIVE NEGATIVE   Ketones, ur NEGATIVE NEGATIVE mg/dL   Protein, ur  NEGATIVE NEGATIVE mg/dL   Nitrite NEGATIVE NEGATIVE   Leukocytes, UA TRACE (A) NEGATIVE  Urine microscopic-add on     Status: Abnormal   Collection Time: 10/23/15  7:22 PM  Result Value Ref Range   Squamous Epithelial / LPF 0-5 (A) NONE SEEN   WBC, UA 0-5 0 - 5 WBC/hpf   RBC / HPF TOO NUMEROUS TO COUNT 0 - 5 RBC/hpf   Bacteria, UA MANY (A) NONE SEEN  Heparin level (unfractionated)     Status: None   Collection Time: 10/23/15  8:30 PM  Result Value Ref Range   Heparin Unfractionated 0.57 0.30 - 0.70 IU/mL  Glucose, capillary     Status: Abnormal   Collection Time: 10/23/15  9:24 PM  Result Value Ref Range   Glucose-Capillary 153 (H) 65 - 99 mg/dL   Comment 1 Notify RN    Comment 2 Document in Chart   Heparin level (unfractionated)     Status: None   Collection Time: 10/24/15  3:12 AM  Result Value Ref Range   Heparin Unfractionated 0.46 0.30 - 0.70 IU/mL  CBC  Status: Abnormal   Collection Time: 10/24/15  3:12 AM  Result Value Ref Range   WBC 12.2 (H) 4.0 - 10.5 K/uL   RBC 2.96 (L) 4.22 - 5.81 MIL/uL   Hemoglobin 8.5 (L) 13.0 - 17.0 g/dL   HCT 27.6 (L) 39.0 - 52.0 %   MCV 93.2 78.0 - 100.0 fL   MCH 28.7 26.0 - 34.0 pg   MCHC 30.8 30.0 - 36.0 g/dL   RDW 15.0 11.5 - 15.5 %   Platelets 186 150 - 400 K/uL  Basic metabolic panel     Status: Abnormal   Collection Time: 10/24/15  3:12 AM  Result Value Ref Range   Sodium 138 135 - 145 mmol/L   Potassium 3.4 (L) 3.5 - 5.1 mmol/L   Chloride 99 (L) 101 - 111 mmol/L   CO2 30 22 - 32 mmol/L   Glucose, Bld 144 (H) 65 - 99 mg/dL   BUN 19 6 - 20 mg/dL   Creatinine, Ser 1.00 0.61 - 1.24 mg/dL   Calcium 8.3 (L) 8.9 - 10.3 mg/dL   GFR calc non Af Amer >60 >60 mL/min   GFR calc Af Amer >60 >60 mL/min   Anion gap 9 5 - 15  Protime-INR     Status: Abnormal   Collection Time: 10/24/15  3:12 AM  Result Value Ref Range   Prothrombin Time 20.4 (H) 11.6 - 15.2 seconds   INR 1.74 (H) 0.00 - 1.49    Studies/Results: No results  found.  Medications: Reviewed  @PROBHOSP @  1  Acute diastolic CHF  Diuresing  Volume improved  Continue  2.  Afib  Remains in SR On heparin transition to coumadin  3.  MV  S/p repair  4.  HL  Continue statin  5  Anemia Hgb 8.5  6  Gout  Clinically improving on colchicine    LOS: 7 days   Dorris Carnes 10/24/2015, 9:27 AM

## 2015-10-24 NOTE — Progress Notes (Addendum)
      [address]       ,[place] [address]             [phone]      4 Days Post-Op Procedure(s) (LRB): CARDIOVERSION (N/A)   Subjective:  [name] has no new complaints.  He states his right ankle pain is better and he is hoping he will be able to walk on it today.  Objective: Vital signs in last 24 hours: Temp:  [98.1 F (36.7 C)-98.7 F (37.1 C)] 98.7 F (37.1 C) (03/20 1941) Pulse Rate:  [65-74] 74 (03/20 2357) Cardiac Rhythm:  [-] Normal sinus rhythm (03/21 0734) Resp:  [16-17] 16 (03/20 1941) BP: (95-116)/(54-72) 116/60 mmHg (03/20 2357) SpO2:  [95 %-96 %] 96 % (03/20 1941) Weight:  [224 lb 1.6 oz (101.651 kg)] 224 lb 1.6 oz (101.651 kg) (03/21 0700)  Intake/Output from previous day: 03/20 0701 - 03/21 0700 In: 720 [P.O.:720] Out: 3025 [Urine:3025]  General appearance: alert, cooperative and no distress Heart: regular rate and rhythm Lungs: diminished breath sounds bibasilar Abdomen: soft, non-tender; bowel sounds normal; no masses,  no organomegaly Extremities: edema 2-3+ bilateral Wound: sternal incision clean, R groin remains stable  Lab Results:  Recent Labs  10/23/15 0533 10/24/15 0312  WBC 12.9* 12.2*  HGB 8.8* 8.5*  HCT 28.3* 27.6*  PLT 176 186   BMET:  Recent Labs  10/23/15 0533 10/24/15 0312  NA 137 138  K 2.9* 3.4*  CL 101 99*  CO2 27 30  GLUCOSE 133* 144*  BUN 21* 19  CREATININE 0.97 1.00  CALCIUM 8.2* 8.3*    PT/INR:  Recent Labs  10/24/15 0312  LABPROT 20.4*  INR 1.74*   ABG    Component Value Date/Time   PHART 7.339* 10/06/2015 0115   HCO3 20.7 10/06/2015 0115   TCO2 22 10/06/2015 1615   ACIDBASEDEF 5.0* 10/06/2015 0115   O2SAT 91.0 10/06/2015 0115   CBG (last 3)   Recent Labs  10/23/15 1054 10/23/15 1633 10/23/15 2124  GLUCAP 186* 138* 153*    Assessment/Plan: S/P Procedure(s) (LRB): CARDIOVERSION (N/A)  1. CV- hemodynamically stable in NSR- continue Amiodarone, Lopressor 2. INR  1.74, on coumadin at 2.5 mg daily 3.  Right ankle pain- likely gout flair, on Colchicine with improvement in symptoms, continue for now 4. Right groin dehiscence- early, continue wound care recommendations 5. Dispo- patient stable, care per primary   LOS: 7 days    BARRETT, ERIN 10/24/2015  I have seen and examined the patient and agree with the assessment and plan as outlined.  Groin incision looks fine.  Ankle pain improving but still hasn't been back up and on his feet.  He was ambulating reasonably well using a walker previously.  Discussed progress and plans with the patient and his wife.  Anticipate d/c to SNF once his volume status has been optimized and he's ambulating w/ minimal assistance.  Rexene Alberts, MD 10/24/2015 11:31 AM

## 2015-10-25 ENCOUNTER — Inpatient Hospital Stay (HOSPITAL_COMMUNITY): Payer: Medicare Other

## 2015-10-25 LAB — CBC
HCT: 29.3 % — ABNORMAL LOW (ref 39.0–52.0)
Hemoglobin: 9 g/dL — ABNORMAL LOW (ref 13.0–17.0)
MCH: 28.4 pg (ref 26.0–34.0)
MCHC: 30.7 g/dL (ref 30.0–36.0)
MCV: 92.4 fL (ref 78.0–100.0)
Platelets: 192 10*3/uL (ref 150–400)
RBC: 3.17 MIL/uL — ABNORMAL LOW (ref 4.22–5.81)
RDW: 14.7 % (ref 11.5–15.5)
WBC: 11.9 10*3/uL — AB (ref 4.0–10.5)

## 2015-10-25 LAB — GLUCOSE, CAPILLARY
Glucose-Capillary: 134 mg/dL — ABNORMAL HIGH (ref 65–99)
Glucose-Capillary: 121 mg/dL — ABNORMAL HIGH (ref 65–99)
Glucose-Capillary: 128 mg/dL — ABNORMAL HIGH (ref 65–99)

## 2015-10-25 LAB — BASIC METABOLIC PANEL
Anion gap: 6 (ref 5–15)
BUN: 18 mg/dL (ref 6–20)
CO2: 31 mmol/L (ref 22–32)
CREATININE: 1.08 mg/dL (ref 0.61–1.24)
Calcium: 8.3 mg/dL — ABNORMAL LOW (ref 8.9–10.3)
Chloride: 96 mmol/L — ABNORMAL LOW (ref 101–111)
GFR calc Af Amer: >60 mL/min (ref >60)
GLUCOSE: 130 mg/dL — AB (ref 65–99)
Potassium: 3.5 mmol/L (ref 3.5–5.1)
SODIUM: 133 mmol/L — AB (ref 135–145)

## 2015-10-25 LAB — PROTIME-INR
INR: 1.78 — AB (ref 0.00–1.49)
PROTHROMBIN TIME: 20.7 s — AB (ref 11.6–15.2)

## 2015-10-25 LAB — HEPARIN LEVEL (UNFRACTIONATED): HEPARIN UNFRACTIONATED: 0.55 [IU]/mL (ref 0.30–0.70)

## 2015-10-25 MED ORDER — WARFARIN SODIUM 2 MG PO TABS
2.0000 mg | ORAL_TABLET | Freq: Once | ORAL | Status: AC
  Administered 2015-10-25: 2 mg via ORAL
  Filled 2015-10-25: qty 1

## 2015-10-25 MED ORDER — POTASSIUM CHLORIDE CRYS ER 20 MEQ PO TBCR
40.0000 meq | EXTENDED_RELEASE_TABLET | Freq: Once | ORAL | Status: AC
  Administered 2015-10-25: 40 meq via ORAL
  Filled 2015-10-25: qty 2

## 2015-10-25 NOTE — Progress Notes (Signed)
      [address]       [place] [address]             [phone]        5 Days Post-Op Procedure(s) (LRB): CARDIOVERSION (N/A)  Subjective: Patient states his right ankle is a little better.  Objective: Vital signs in last 24 hours: Temp:  [98.2 F (36.8 C)-98.7 F (37.1 C)] 98.7 F (37.1 C) (03/22 0514) Pulse Rate:  [67-70] 70 (03/22 0514) Cardiac Rhythm:  [-] Normal sinus rhythm (03/21 2234) Resp:  [18] 18 (03/22 0514) BP: (103-104)/(54-56) 103/54 mmHg (03/22 0514) SpO2:  [93 %-95 %] 95 % (03/22 0514) Weight:  [210 lb 8 oz (95.482 kg)] 210 lb 8 oz (95.482 kg) (03/22 0514)  Current Weight  10/25/15 210 lb 8 oz (95.482 kg)       Intake/Output from previous day: 03/21 0701 - 03/22 0700 In: 600 [P.O.:600] Out: 1351 [Urine:1350; Stool:1]   Physical Exam:  Cardiovascular: RRR, murmur Pulmonary: Slightly diminished at bases Abdomen: Soft, non tender, bowel sounds present. Extremities: ++/+++ bilateral lower extremity edema. Right ankle  Wounds: Sternal incision is clean and dry.  No erythema or signs of infection. Right groin wound superficially dehisced but no erythema.  Lab Results: CBC:  Recent Labs  10/24/15 0312 10/25/15 0400  WBC 12.2* 11.9*  HGB 8.5* 9.0*  HCT 27.6* 29.3*  PLT 186 192   BMET:   Recent Labs  10/24/15 0312 10/25/15 0400  NA 138 133*  K 3.4* 3.5  CL 99* 96*  CO2 30 31  GLUCOSE 144* 130*  BUN 19 18  CREATININE 1.00 1.08  CALCIUM 8.3* 8.3*    PT/INR:  Lab Results  Component Value Date   INR 1.78* 10/25/2015   INR 1.74* 10/24/2015   INR 1.70* 10/23/2015   ABG:  INR: Will add last result for INR, ABG once components are confirmed Will add last 4 CBG results once components are confirmed  Assessment/Plan:  1. CV - S/p mini MV repair on 10/04/2015. A fib.On Amiodarone 200 mg daily and Lopressor 50 mg bid, and Coumadin 2.5 mg daily. On Heparin drip. INR  1.78. Anticoagulation being run by  pharmacy 2. Volume Overload - On 80 mg IV Lasix BID and Zaroxolyn 2.5 mg daily 3.  Normocytic anemia - H and H stable at 9 and 29.3 4. Leukocytosis (WBC decreased to 11,900) with NO fever.  5. Right groin wound is superficially dehisced and slight yellow slough. Wound care following. Continue Colchicine for probable right ankle gout 6. Supplement potassium 7.ID-On Rocephin but unsure why  Shakeia Krus MPA-C 10/25/2015,8:24 AM

## 2015-10-25 NOTE — Clinical Social Work Note (Signed)
Patient has been accepted by [facility].   Liz Beach MSW, [place], [place], [phone]

## 2015-10-25 NOTE — Progress Notes (Addendum)
[place] for heparin/warfarin Indication: atrial fibrillation  No Known Allergies  Patient Measurements: Height: 5\' 10"  (177.8 cm) Weight: 210 lb 8 oz (95.482 kg) (scale b) IBW/kg (Calculated) : 73 Heparin Dosing Weight: 93  Vital Signs: Temp: 98.7 F (37.1 C) (03/22 0514) Temp Source: Oral (03/22 0514) BP: 103/54 mmHg (03/22 0514) Pulse Rate: 70 (03/22 0514)  Labs:  Recent Labs  10/23/15 0533  10/23/15 2030 10/24/15 0312 10/25/15 0400  HGB 8.8*  --   --  8.5* 9.0*  HCT 28.3*  --   --  27.6* 29.3*  PLT 176  --   --  186 192  LABPROT 20.0*  --   --  20.4* 20.7*  INR 1.70*  --   --  1.74* 1.78*  HEPARINUNFRC 0.72*  < > 0.57 0.46 0.55  CREATININE 0.97  --   --  1.00 1.08  < > = values in this interval not displayed.  Estimated Creatinine Clearance: 59.1 mL/min (by C-G formula based on Cr of 1.08).  Assessment: 82 yo with Afib on Warfarin PTA. (PTA Warfarin Dose: 2 mg daily)  Per Cards restart Warfarin 3/18 with INR goal of 2-2.5. Heparin until INR > 2.  No bleeding noted, Hgb stable, pltc stable INR subtherapeutic at 1.74 (Goal 2-2.5)  HL slightly supra-therapeutic at 0.55 (goal 0.3-0.5)   Chronic subdural hematoma on head CT on 3/15.  Per Cards MD continue to follow subdural hematoma closely with slow titration of warfarin . No other s/sx of bleeding currently noted.   DDI with Warfarin = Amiodarone (PTA med)  Goal of Therapy:  Heparin level 0.3-0.5 INR 2-2.5 Monitor platelets by anticoagulation protocol: Yes   Plan:   -Warfarin 2 mg x 1 tonight  -Decrease heparin slightly to 1450units/hr -Daily HL, INR and CBC -Monitor for s/sx of bleeding  Pharmacy was also consulted for ceftriaxone dosing for PNA. Today is D#8 of therapy. Ceftriaxone to stop after tonight's dose.  Denai Caba D. Kaipo Ardis, PharmD, BCPS Clinical Pharmacist Pager: [phone] 10/25/2015 9:45 AM

## 2015-10-25 NOTE — Progress Notes (Signed)
Subjective: Breathing OK  NO CP   Objective: Filed Vitals:   10/23/15 2357 10/24/15 0700 10/24/15 1928 10/25/15 0514  BP: 116/60  104/56 103/54  Pulse: 74  67 70  Temp:   98.2 F (36.8 C) 98.7 F (37.1 C)  TempSrc:   Axillary Oral  Resp:    18  Height:      Weight:  224 lb 1.6 oz (101.651 kg)  210 lb 8 oz (95.482 kg)  SpO2:   93% 95%   Weight change: -13 lb 9.6 oz (-6.169 kg)  Intake/Output Summary (Last 24 hours) at 10/25/15 0758 Last data filed at 10/24/15 2125  Gross per 24 hour  Intake    600 ml  Output   1351 ml  Net   -751 ml   Net neg 3.9 L   General: Alert, awake, oriented x3, in no acute distress Neck:  JVP is normal Heart: Regular rate and rhythm, without murmurs, rubs, gallops.  Lungs: rel clear Exemities:  No edema on LLE  1-2+ RLE     Neuro: Grossly intact, nonfocal.  Tele  SR    Lab Results: Results for orders placed or performed during the hospital encounter of 10/17/15 (from the past 24 hour(s))  Heparin level (unfractionated)     Status: None   Collection Time: 10/25/15  4:00 AM  Result Value Ref Range   Heparin Unfractionated 0.55 0.30 - 0.70 IU/mL  Basic metabolic panel     Status: Abnormal   Collection Time: 10/25/15  4:00 AM  Result Value Ref Range   Sodium 133 (L) 135 - 145 mmol/L   Potassium 3.5 3.5 - 5.1 mmol/L   Chloride 96 (L) 101 - 111 mmol/L   CO2 31 22 - 32 mmol/L   Glucose, Bld 130 (H) 65 - 99 mg/dL   BUN 18 6 - 20 mg/dL   Creatinine, Ser 1.08 0.61 - 1.24 mg/dL   Calcium 8.3 (L) 8.9 - 10.3 mg/dL   GFR calc non Af Amer >60 >60 mL/min   GFR calc Af Amer >60 >60 mL/min   Anion gap 6 5 - 15  CBC     Status: Abnormal   Collection Time: 10/25/15  4:00 AM  Result Value Ref Range   WBC 11.9 (H) 4.0 - 10.5 K/uL   RBC 3.17 (L) 4.22 - 5.81 MIL/uL   Hemoglobin 9.0 (L) 13.0 - 17.0 g/dL   HCT 29.3 (L) 39.0 - 52.0 %   MCV 92.4 78.0 - 100.0 fL   MCH 28.4 26.0 - 34.0 pg   MCHC 30.7 30.0 - 36.0 g/dL   RDW 14.7 11.5 - 15.5 %   Platelets 192 150 - 400 K/uL  Protime-INR     Status: Abnormal   Collection Time: 10/25/15  4:00 AM  Result Value Ref Range   Prothrombin Time 20.7 (H) 11.6 - 15.2 seconds   INR 1.78 (H) 0.00 - 1.49    Studies/Results: No results found.  Medications: REviewed   @PROBHOSP @  1  Acute on chronic diastolic CHF  Continues to diurese with IV lasix  Getting 80 bid  Check BNP in AM    2.  Atrial fib   Remains in SR  INR still 1.78  3.  S/P MV repair  4.  HL  Statin    5  Anemia  Hgb 9  6  Gout  Continue colchicine    Pt is mving close to d/c      LOS: 8 days  Dorris Carnes 10/25/2015, 7:58 AM

## 2015-10-25 NOTE — Progress Notes (Signed)
Physical Therapy Treatment Patient Details Name: [name] MRN: [id number] DOB: Oct 27, 1928 Today's Date: 10/25/2015    History of Present Illness [name] is an 82 yo white male with chronic diastolic CHF, coronary artery disease , recurrent SVT s/p DCCV, pulmonary hypertension, type 2 diabetes mellitus, hyperlipidemia, and chronic back pain who recently underwent minimally invasive Mitral Valve Repair 10/04/15 presented with SOB and LE edema 10/17/15.     PT Comments    Pt admitted with above diagnosis. Pt presents with functional limitations due to decreased strength, balance, functional activity tolerance, and R LE pain. Pt needs max encouragement to participate in therapies and to get OOB. Pt is able to ambulate with RW with Min Guard for safety but pt only wants to walk minimal distances. Pt will continue to benefit from PT services to improve functional mobility and decrease fall risk.    Follow Up Recommendations  SNF;Supervision/Assistance - 24 hour     Equipment Recommendations  None recommended by PT    Recommendations for Other Services       Precautions / Restrictions Precautions Precautions: Fall;Sternal Restrictions Weight Bearing Restrictions: No    Mobility  Bed Mobility Overal bed mobility: Needs Assistance Bed Mobility: Sidelying to Sit;Rolling Rolling: Min guard Sidelying to sit: Min guard;HOB elevated       General bed mobility comments: Pt's bed mobility much improved. Pt able to get OOB with no physical assistance with HOB elevated.   Transfers Overall transfer level: Needs assistance Equipment used: Rolling walker (2 wheeled) Transfers: Sit to/from Stand Sit to Stand: From elevated surface;Min assist         General transfer comment: Pt able to stand from bed and chair with minimal assistance.   Ambulation/Gait Ambulation/Gait assistance: Min guard Ambulation Distance (Feet): 14 Feet (4+10) Assistive device: Rolling walker (2  wheeled) Gait Pattern/deviations: Step-to pattern;Decreased stride length;Shuffle;Trunk flexed;Narrow base of support Gait velocity: very slow Gait velocity interpretation: Below normal speed for age/gender General Gait Details: Pt needed Max encouragment to walk today. He only would do minimal ambulation.    Stairs            Wheelchair Mobility    Modified Rankin (Stroke Patients Only)       Balance Overall balance assessment: Needs assistance Sitting-balance support: Feet supported;No upper extremity supported Sitting balance-Leahy Scale: Good     Standing balance support: Bilateral upper extremity supported Standing balance-Leahy Scale: Fair Standing balance comment: Reliant on RW             High level balance activites: Turns High Level Balance Comments: Min Guard for safety with use of RW.     Cognition Arousal/Alertness: Awake/alert Behavior During Therapy: WFL for tasks assessed/performed Overall Cognitive Status: Within Functional Limits for tasks assessed                      Exercises General Exercises - Lower Extremity Long Arc Quad: PROM;Both;10 reps;Seated Hip Flexion/Marching: AROM;Both;10 reps;Seated    General Comments General comments (skin integrity, edema, etc.): Pt needs Max encouragement to participate in therapy. RN changed sacral dressing before getting OOB.       Pertinent Vitals/Pain Pain Assessment: Faces Faces Pain Scale: Hurts little more Pain Location: R LE Pain Descriptors / Indicators: Constant;Grimacing Pain Intervention(s): Monitored during session;Repositioned  Vitals stable throughout activity.     Home Living                      Prior  Function            PT Goals (current goals can now be found in the care plan section) Acute Rehab PT Goals Patient Stated Goal: to go home PT Goal Formulation: With patient/family Time For Goal Achievement: 11/02/15 Potential to Achieve Goals: Fair Progress  towards PT goals: Progressing toward goals    Frequency  Min 2X/week    PT Plan Current plan remains appropriate    Co-evaluation             End of Session Equipment Utilized During Treatment: Gait belt Activity Tolerance: Patient limited by pain Patient left: in chair;with call bell/phone within reach;with chair alarm set;with family/visitor present     Time: [id number] PT Time Calculation (min) (ACUTE ONLY): 20 min  Charges:  $Gait Training: 8-22 mins                    G Codes:      Colon Branch, SPT Colon Branch 10/25/2015, 1:07 PM

## 2015-10-26 LAB — BASIC METABOLIC PANEL
Anion gap: 10 (ref 5–15)
BUN: 23 mg/dL — AB (ref 6–20)
CALCIUM: 8.6 mg/dL — AB (ref 8.9–10.3)
CHLORIDE: 97 mmol/L — AB (ref 101–111)
CO2: 31 mmol/L (ref 22–32)
CREATININE: 1.22 mg/dL (ref 0.61–1.24)
GFR calc non Af Amer: 53 mL/min — ABNORMAL LOW (ref >60)
GLUCOSE: 124 mg/dL — AB (ref 65–99)
Potassium: 4.2 mmol/L (ref 3.5–5.1)
Sodium: 138 mmol/L (ref 135–145)

## 2015-10-26 LAB — CBC
HCT: 29 % — ABNORMAL LOW (ref 39.0–52.0)
Hemoglobin: 9 g/dL — ABNORMAL LOW (ref 13.0–17.0)
MCH: 29 pg (ref 26.0–34.0)
MCHC: 31 g/dL (ref 30.0–36.0)
MCV: 93.5 fL (ref 78.0–100.0)
PLATELETS: 197 10*3/uL (ref 150–400)
RBC: 3.1 MIL/uL — ABNORMAL LOW (ref 4.22–5.81)
RDW: 14.8 % (ref 11.5–15.5)
WBC: 10.3 10*3/uL (ref 4.0–10.5)

## 2015-10-26 LAB — PROTIME-INR
INR: 2.21 — ABNORMAL HIGH (ref 0.00–1.49)
PROTHROMBIN TIME: 24.3 s — AB (ref 11.6–15.2)

## 2015-10-26 LAB — GLUCOSE, CAPILLARY: GLUCOSE-CAPILLARY: 120 mg/dL — AB (ref 65–99)

## 2015-10-26 LAB — HEPARIN LEVEL (UNFRACTIONATED): Heparin Unfractionated: 0.49 IU/mL (ref 0.30–0.70)

## 2015-10-26 LAB — BRAIN NATRIURETIC PEPTIDE: B Natriuretic Peptide: 685.7 pg/mL — ABNORMAL HIGH (ref 0.0–100.0)

## 2015-10-26 MED ORDER — METOPROLOL TARTRATE 25 MG PO TABS: 25.0000 mg | ORAL_TABLET | Freq: Two times a day (BID) | ORAL | Status: DC

## 2015-10-26 MED ORDER — WARFARIN SODIUM 2 MG PO TABS
2.0000 mg | ORAL_TABLET | Freq: Once | ORAL | Status: AC
  Administered 2015-10-26: 2 mg via ORAL
  Filled 2015-10-26: qty 1

## 2015-10-26 MED ORDER — METOPROLOL TARTRATE 12.5 MG HALF TABLET
12.5000 mg | ORAL_TABLET | Freq: Two times a day (BID) | ORAL | Status: DC
  Administered 2015-10-26 – 2015-10-31 (×10): 12.5 mg via ORAL
  Filled 2015-10-26 (×13): qty 1

## 2015-10-26 NOTE — Care Management Important Message (Signed)
Important Message  Patient Details  Name: [name] MRN: [id number] Date of Birth: 1928/08/24   Medicare Important Message Given:  Yes    Shasta Chinn P [place] 10/26/2015, 1:31 PM

## 2015-10-26 NOTE — Progress Notes (Addendum)
      [address]       [place], [address]             [phone]        6 Days Post-Op Procedure(s) (LRB): CARDIOVERSION (N/A)  Subjective:  He is just starting to wake up this am. [name]  Objective: Vital signs in last 24 hours: Temp:  [97.4 F (36.3 C)-97.8 F (36.6 C)] 97.6 F (36.4 C) (03/23 0628) Pulse Rate:  [64-72] 72 (03/23 0628) Cardiac Rhythm:  [-] Normal sinus rhythm (03/23 0205) Resp:  [16-20] 16 (03/23 0628) BP: (95-110)/(52-82) 101/82 mmHg (03/23 0628) SpO2:  [96 %-100 %] 100 % (03/23 0628) Weight:  [210 lb 7.4 oz (95.464 kg)] 210 lb 7.4 oz (95.464 kg) (03/23 0628)  Current Weight  10/26/15 210 lb 7.4 oz (95.464 kg)       Intake/Output from previous day: 03/22 0701 - 03/23 0700 In: 2500.7 [P.O.:1200; I.V.:1300.7] Out: 1100 [Urine:1100]   Physical Exam:  Cardiovascular: Bradycardic, murmur Pulmonary: Slightly diminished at bases Abdomen: Soft, non tender, bowel sounds present. Extremities: ++/+++ bilateral lower extremity edema. Right ankle  Wounds: Sternal incision is clean and dry.  No erythema or signs of infection. Right groin wound superficially dehisced but no erythema.   Lab Results: CBC:  Recent Labs  10/25/15 0400 10/26/15 0623  WBC 11.9* 10.3  HGB 9.0* 9.0*  HCT 29.3* 29.0*  PLT 192 197   BMET:   Recent Labs  10/25/15 0400 10/26/15 0623  NA 133* 138  K 3.5 4.2  CL 96* 97*  CO2 31 31  GLUCOSE 130* 124*  BUN 18 23*  CREATININE 1.08 1.22  CALCIUM 8.3* 8.6*    PT/INR:  Lab Results  Component Value Date   INR 2.21* 10/26/2015   INR 1.78* 10/25/2015   INR 1.74* 10/24/2015   ABG:  INR: Will add last result for INR, ABG once components are confirmed Will add last 4 CBG results once components are confirmed  Assessment/Plan:  1. CV - S/p mini MV repair on 10/04/2015. Previous A fib.SR/SB in the 50's-low 60's this am.On Amiodarone 200 mg daily  and Lopressor 50 mg bid, and Coumadin 2.5 mg daily. Will decrease Lopressor to 12.5 mg bid. On Heparin drip. INR  2.21.  Likely stop Heparin drip-anticoagulation being run by pharmacy 2. Volume Overload - On 80 mg IV Lasix BID and Zaroxolyn 2.5 mg daily 3.  Normocytic anemia - H and H stable at 9 and 2 4. Leukocytosis resolved as WBC decreased to 10,300 with Remains afebrile. 5. Right groin wound is superficially dehisced and slight yellow slough. Wound care following.  6.Continue Colchicine for probable right ankle gout   [name] MPA-C 10/26/2015,8:07 AM

## 2015-10-26 NOTE — Progress Notes (Signed)
ANTICOAGULATION CONSULT NOTE   Pharmacy Consult for warfarin Indication: atrial fibrillation  No Known Allergies  Patient Measurements: Height: 5\' 10"  (177.8 cm) Weight: 210 lb 7.4 oz (95.464 kg) (Scale B) IBW/kg (Calculated) : 73 Heparin Dosing Weight: 93  Vital Signs: Temp: 97.6 F (36.4 C) (03/23 0628) Temp Source: Oral (03/23 0628) BP: 101/82 mmHg (03/23 0628) Pulse Rate: 72 (03/23 0628)  Labs:  Recent Labs  10/24/15 0312 10/25/15 0400 10/26/15 0623  HGB 8.5* 9.0* 9.0*  HCT 27.6* 29.3* 29.0*  PLT 186 192 197  LABPROT 20.4* 20.7* 24.3*  INR 1.74* 1.78* 2.21*  HEPARINUNFRC 0.46 0.55 0.49  CREATININE 1.00 1.08 1.22    Estimated Creatinine Clearance: 52.3 mL/min (by C-G formula based on Cr of 1.22).  Assessment: 82 yo with Afib on Warfarin PTA. (PTA Warfarin Dose: 2 mg daily)  Per Cards restarted Warfarin 3/18 with INR goal of 2-2.5.  No bleeding noted, Hgb stable, pltc stable INR now in range at 2.21. HL was in goal range today at 0.49.  Chronic subdural hematoma on head CT on 3/15.  Per Cards MD continue to follow subdural hematoma closely with slow titration of warfarin. No other s/sx of bleeding currently noted.   DDI with Warfarin = Amiodarone (PTA med)  Goal of Therapy:  INR 2-2.5 Monitor platelets by anticoagulation protocol: Yes   Plan:   -stop heparin as INR therapeutic- discussed with Dr. Harrington Challenger -Warfarin 2 mg x 1 tonight  -Daily INR and CBC -Monitor for s/sx of bleeding  Alasha Mcguinness D. Lorrayne Ismael, PharmD, BCPS Clinical Pharmacist Pager: [phone] 10/26/2015 9:51 AM

## 2015-10-26 NOTE — Progress Notes (Signed)
Subjective: Pt denies CP  Breathing is OK  Objective: Filed Vitals:   10/25/15 1315 10/25/15 1750 10/25/15 2256 10/26/15 0628  BP: 110/61 95/52 98/62  101/82  Pulse: 64  69 72  Temp: 97.4 F (36.3 C)  97.8 F (36.6 C) 97.6 F (36.4 C)  TempSrc: Oral  Oral Oral  Resp: 20  18 16   Height:      Weight:    210 lb 7.4 oz (95.464 kg)  SpO2: 96%  98% 100%   Weight change: -0.6 oz (-0.018 kg)  Intake/Output Summary (Last 24 hours) at 10/26/15 0945 Last data filed at 10/26/15 0906  Gross per 24 hour  Intake 2380.73 ml  Output   1400 ml  Net 980.73 ml   Net neg 2.8 L    General: Alert, awake, oriented x3, in no acute distress Neck:  JVP is normal Heart: Regular rate and rhythm, without murmurs, rubs, gallops.  Lungs: Clear to auscultation.  No rales or wheezes. Exemities:  Tr to 1+ edema.   Neuro: Grossly intact, nonfocal.  Tele  SR   Lab Results: Results for orders placed or performed during the hospital encounter of 10/17/15 (from the past 24 hour(s))  Glucose, capillary     Status: Abnormal   Collection Time: 10/25/15 11:46 AM  Result Value Ref Range   Glucose-Capillary 134 (H) 65 - 99 mg/dL   Comment 1 Notify RN    Comment 2 Document in Chart   Glucose, capillary     Status: Abnormal   Collection Time: 10/25/15  4:43 PM  Result Value Ref Range   Glucose-Capillary 121 (H) 65 - 99 mg/dL  Glucose, capillary     Status: Abnormal   Collection Time: 10/25/15  9:49 PM  Result Value Ref Range   Glucose-Capillary 128 (H) 65 - 99 mg/dL  Heparin level (unfractionated)     Status: None   Collection Time: 10/26/15  6:23 AM  Result Value Ref Range   Heparin Unfractionated 0.49 0.30 - 0.70 IU/mL  Brain natriuretic peptide     Status: Abnormal   Collection Time: 10/26/15  6:23 AM  Result Value Ref Range   B Natriuretic Peptide 685.7 (H) 0.0 - 100.0 pg/mL  Basic metabolic panel     Status: Abnormal   Collection Time: 10/26/15  6:23 AM  Result Value Ref Range   Sodium 138  135 - 145 mmol/L   Potassium 4.2 3.5 - 5.1 mmol/L   Chloride 97 (L) 101 - 111 mmol/L   CO2 31 22 - 32 mmol/L   Glucose, Bld 124 (H) 65 - 99 mg/dL   BUN 23 (H) 6 - 20 mg/dL   Creatinine, Ser 1.22 0.61 - 1.24 mg/dL   Calcium 8.6 (L) 8.9 - 10.3 mg/dL   GFR calc non Af Amer 53 (L) >60 mL/min   GFR calc Af Amer >60 >60 mL/min   Anion gap 10 5 - 15  CBC     Status: Abnormal   Collection Time: 10/26/15  6:23 AM  Result Value Ref Range   WBC 10.3 4.0 - 10.5 K/uL   RBC 3.10 (L) 4.22 - 5.81 MIL/uL   Hemoglobin 9.0 (L) 13.0 - 17.0 g/dL   HCT 29.0 (L) 39.0 - 52.0 %   MCV 93.5 78.0 - 100.0 fL   MCH 29.0 26.0 - 34.0 pg   MCHC 31.0 30.0 - 36.0 g/dL   RDW 14.8 11.5 - 15.5 %   Platelets 197 150 - 400 K/uL  Protime-INR  Status: Abnormal   Collection Time: 10/26/15  6:23 AM  Result Value Ref Range   Prothrombin Time 24.3 (H) 11.6 - 15.2 seconds   INR 2.21 (H) 0.00 - 1.49    Studies/Results: No results found.  Medications: Reviewed    @PROBHOSP @   Acute on chronic diastolic CHF Continues to diurese with IV lasixExam is improved   Getting 80 bid Check BNP in AM   2. Atrial fib Remains in SR INR still 1.78  3. S/P MV repair  4. HL Statin   5 Anemia Hgb 9  6 Gout Continue colchicine   Pt close to tx to rehab     LOS: 9 days   Dorris Carnes 10/26/2015, 9:45 AM

## 2015-10-26 NOTE — Progress Notes (Signed)
Physical Therapy Treatment Patient Details Name: [name] MRN: [id number] DOB: [birth year] Today's Date: 10/26/2015    History of Present Illness Mr. Hogrefe is an 82 yo white male with chronic diastolic CHF, coronary artery disease , recurrent SVT s/p DCCV, pulmonary hypertension, type 2 diabetes mellitus, hyperlipidemia, and chronic back pain who recently underwent minimally invasive Mitral Valve Repair 10/04/15 presented with SOB and LE edema 10/17/15.     PT Comments    Patient progressing with ambulation distance and activity tolerance not needing as much encouragement to participate.  Feel continued skilled PT in the acute setting needed to progress to home following SNF level rehab stay.  Follow Up Recommendations  SNF;Supervision/Assistance - 24 hour     Equipment Recommendations  None recommended by PT    Recommendations for Other Services       Precautions / Restrictions Precautions Precautions: Fall;Sternal    Mobility  Bed Mobility Overal bed mobility: Needs Assistance Bed Mobility: Supine to Sit     Supine to sit: Supervision;HOB elevated     General bed mobility comments: used bed rail  Transfers Overall transfer level: Needs assistance Equipment used: Rolling walker (2 wheeled) Transfers: Risk manager;Sit to/from Stand Sit to Stand: Min assist Stand pivot transfers: Min assist       General transfer comment: assist from bed to [place] no walker, using rail for safety and clothing management; to stand uses momentum and cues for hand placement  Ambulation/Gait   Ambulation Distance (Feet): 20 Feet (and 12') Assistive device: Rolling walker (2 wheeled) Gait Pattern/deviations: Shuffle;Step-to pattern;Trunk flexed;Decreased stride length     General Gait Details: Patient eager to participate.  Needed increased time to "calm his nerves" first.  chair followed and pt self limited with HR max 95   Stairs            Wheelchair Mobility     Modified Rankin (Stroke Patients Only)       Balance Overall balance assessment: Needs assistance         Standing balance support: Bilateral upper extremity supported Standing balance-Leahy Scale: Poor Standing balance comment: unable to assist with donning briefs in standing                    Cognition Arousal/Alertness: Awake/alert Behavior During Therapy: WFL for tasks assessed/performed Overall Cognitive Status: Within Functional Limits for tasks assessed                      Exercises General Exercises - Lower Extremity Ankle Circles/Pumps: AROM;10 reps;Both;Supine Short Arc Quad: AROM;Both;10 reps;Supine Heel Slides: AROM;Both;10 reps;Supine    General Comments General comments (skin integrity, edema, etc.): still with loose stools and toileted prior to ambulation      Pertinent Vitals/Pain Faces Pain Scale: No hurt    Home Living                      Prior Function            PT Goals (current goals can now be found in the care plan section) Progress towards PT goals: Progressing toward goals    Frequency  Min 2X/week    PT Plan Current plan remains appropriate    Co-evaluation             End of Session Equipment Utilized During Treatment: Gait belt Activity Tolerance: Patient limited by fatigue Patient left: in chair;with call bell/phone within reach;with chair alarm set;with family/visitor  present     Time: [id number] PT Time Calculation (min) (ACUTE ONLY): 42 min  Charges:  $Gait Training: 23-37 mins $Therapeutic Activity: 8-22 mins                    G Codes:      Reginia Naas November 01, 2015, 4:13 PM  Magda Kiel, [place] 11-01-15

## 2015-10-27 DIAGNOSIS — I5023 Acute on chronic systolic (congestive) heart failure: Secondary | ICD-10-CM

## 2015-10-27 LAB — GLUCOSE, CAPILLARY
GLUCOSE-CAPILLARY: 128 mg/dL — AB (ref 65–99)
GLUCOSE-CAPILLARY: 121 mg/dL — AB (ref 65–99)
GLUCOSE-CAPILLARY: 124 mg/dL — AB (ref 65–99)

## 2015-10-27 LAB — BASIC METABOLIC PANEL
ANION GAP: 11 (ref 5–15)
BUN: 25 mg/dL — ABNORMAL HIGH (ref 6–20)
CALCIUM: 8.7 mg/dL — AB (ref 8.9–10.3)
CO2: 31 mmol/L (ref 22–32)
CREATININE: 1.45 mg/dL — AB (ref 0.61–1.24)
Chloride: 97 mmol/L — ABNORMAL LOW (ref 101–111)
GFR, EST AFRICAN AMERICAN: 49 mL/min — AB (ref >60)
GFR, EST NON AFRICAN AMERICAN: 43 mL/min — AB (ref >60)
Glucose, Bld: 123 mg/dL — ABNORMAL HIGH (ref 65–99)
Potassium: 4.6 mmol/L (ref 3.5–5.1)
SODIUM: 139 mmol/L (ref 135–145)

## 2015-10-27 LAB — CBC
HCT: 32.4 % — ABNORMAL LOW (ref 39.0–52.0)
HEMOGLOBIN: 9.8 g/dL — AB (ref 13.0–17.0)
MCH: 28.3 pg (ref 26.0–34.0)
MCHC: 30.2 g/dL (ref 30.0–36.0)
MCV: 93.6 fL (ref 78.0–100.0)
PLATELETS: 215 10*3/uL (ref 150–400)
RBC: 3.46 MIL/uL — AB (ref 4.22–5.81)
RDW: 14.9 % (ref 11.5–15.5)
WBC: 9.8 10*3/uL (ref 4.0–10.5)

## 2015-10-27 LAB — PROTIME-INR
INR: 2.16 — AB (ref 0.00–1.49)
PROTHROMBIN TIME: 23.9 s — AB (ref 11.6–15.2)

## 2015-10-27 MED ORDER — FUROSEMIDE 10 MG/ML IJ SOLN
80.0000 mg | Freq: Every day | INTRAMUSCULAR | Status: DC
  Filled 2015-10-27: qty 8

## 2015-10-27 MED ORDER — JUVEN PO PACK
1.0000 | PACK | Freq: Two times a day (BID) | ORAL | Status: DC
  Administered 2015-10-28: 1 via ORAL
  Filled 2015-10-27 (×2): qty 1

## 2015-10-27 MED ORDER — AMIODARONE IV BOLUS ONLY 150 MG/100ML
150.0000 mg | Freq: Once | INTRAVENOUS | Status: AC
  Administered 2015-10-27: 150 mg via INTRAVENOUS
  Filled 2015-10-27: qty 100

## 2015-10-27 MED ORDER — WARFARIN SODIUM 2 MG PO TABS
2.0000 mg | ORAL_TABLET | Freq: Once | ORAL | Status: AC
  Administered 2015-10-27: 2 mg via ORAL
  Filled 2015-10-27: qty 1

## 2015-10-27 MED ORDER — CETYLPYRIDINIUM CHLORIDE 0.05 % MT LIQD
7.0000 mL | Freq: Two times a day (BID) | OROMUCOSAL | Status: DC
  Administered 2015-10-27 – 2015-10-31 (×7): 7 mL via OROMUCOSAL

## 2015-10-27 MED ORDER — ZOLPIDEM TARTRATE 5 MG PO TABS
5.0000 mg | ORAL_TABLET | Freq: Once | ORAL | Status: AC
  Administered 2015-10-27: 5 mg via ORAL
  Filled 2015-10-27: qty 1

## 2015-10-27 MED ORDER — POTASSIUM CHLORIDE CRYS ER 20 MEQ PO TBCR
40.0000 meq | EXTENDED_RELEASE_TABLET | Freq: Every day | ORAL | Status: DC
Start: 2015-10-28 — End: 2015-10-31
  Administered 2015-10-28 – 2015-10-31 (×4): 40 meq via ORAL
  Filled 2015-10-27 (×4): qty 2

## 2015-10-27 MED ORDER — AMIODARONE HCL 200 MG PO TABS
400.0000 mg | ORAL_TABLET | Freq: Two times a day (BID) | ORAL | Status: DC
  Administered 2015-10-27 – 2015-10-30 (×6): 400 mg via ORAL
  Filled 2015-10-27 (×6): qty 2

## 2015-10-27 NOTE — Progress Notes (Signed)
Subjective: Pt resting comfortably  No SOB   Objective: Filed Vitals:   10/27/15 0446 10/27/15 0456 10/27/15 0559 10/27/15 1202  BP: 91/61  91/67 102/60  Pulse: 125  118 84  Temp:      TempSrc:      Resp:   20   Height:      Weight:  203 lb 4.8 oz (92.216 kg)    SpO2:   97%    Weight change: -7 lb 2.6 oz (-3.248 kg)  Intake/Output Summary (Last 24 hours) at 10/27/15 1307 Last data filed at 10/27/15 0242  Gross per 24 hour  Intake    480 ml  Output   1075 ml  Net   -595 ml   Net neg 3.2 L   General: Alert, awake, oriented x3, in no acute distress Neck:  JVP is normal Heart: Regular rate and rhythm, Gr II/VI systolic murmur LSB  NO rubs, gallops.  Lungs: Clear to auscultation.  No rales or wheezes. Exemities:  1+  edema.   Neuro: Grossly intact, nonfocal.   Lab Results: Results for orders placed or performed during the hospital encounter of 10/17/15 (from the past 24 hour(s))  Glucose, capillary     Status: Abnormal   Collection Time: 10/26/15  8:42 PM  Result Value Ref Range   Glucose-Capillary 120 (H) 65 - 99 mg/dL  Glucose, capillary     Status: Abnormal   Collection Time: 10/27/15  5:33 AM  Result Value Ref Range   Glucose-Capillary 128 (H) 65 - 99 mg/dL  Protime-INR     Status: Abnormal   Collection Time: 10/27/15  5:45 AM  Result Value Ref Range   Prothrombin Time 23.9 (H) 11.6 - 15.2 seconds   INR 2.16 (H) 0.00 - [id number]  Basic metabolic panel     Status: Abnormal   Collection Time: 10/27/15  5:45 AM  Result Value Ref Range   Sodium 139 135 - 145 mmol/L   Potassium 4.6 3.5 - 5.1 mmol/L   Chloride 97 (L) 101 - 111 mmol/L   CO2 31 22 - 32 mmol/L   Glucose, Bld 123 (H) 65 - 99 mg/dL   BUN 25 (H) 6 - 20 mg/dL   Creatinine, Ser 1.45 (H) 0.61 - 1.24 mg/dL   Calcium 8.7 (L) 8.9 - 10.3 mg/dL   GFR calc non Af Amer 43 (L) >60 mL/min   GFR calc Af Amer 49 (L) >60 mL/min   Anion gap 11 5 - 15  CBC     Status: Abnormal   Collection Time: 10/27/15  5:45 AM    Result Value Ref Range   WBC 9.8 4.0 - 10.5 K/uL   RBC 3.46 (L) 4.22 - 5.81 MIL/uL   Hemoglobin 9.8 (L) 13.0 - 17.0 g/dL   HCT 32.4 (L) 39.0 - 52.0 %   MCV 93.6 78.0 - 100.0 fL   MCH 28.3 26.0 - 34.0 pg   MCHC 30.2 30.0 - 36.0 g/dL   RDW 14.9 11.5 - 15.5 %   Platelets 215 150 - 400 K/uL    Studies/Results: No results found.  Medications:Reviewed    @PROBHOSP @  1  Acute on chronic diastolic CHF  WIll switch lasix to daily  with bump  In creatinine Follow I/Os  2  Atrial fib  Recurrent spell  Broke   Would increase amio to  400 bid  3. S/[P MV repair  4.  Anemia  Hgb 9.8  5  Gout  Colchicine.  LOS: 10 days   Dorris Carnes 10/27/2015, 1:07 PM

## 2015-10-27 NOTE — Clinical Social Work Placement (Signed)
   CLINICAL SOCIAL WORK PLACEMENT  NOTE  Date:  10/27/2015  Patient Details  Name: [name] MRN: [id number] Date of Birth: 03-08-1929  Clinical Social Work is seeking post-discharge placement for this patient at the [place] level of care (*CSW will initial, date and re-position this form in  chart as items are completed):  Yes   Patient/family provided with [facility] Department's list of facilities offering this level of care within the geographic area requested by the patient (or if unable, by the patient's family).  Yes   Patient/family informed of their freedom to choose among providers that offer the needed level of care, that participate in Medicare, Medicaid or managed care program needed by the patient, have an available bed and are willing to accept the patient.  Yes   Patient/family informed of [facility]'s ownership interest in [facility] and [facility], as well as of the fact that they are under no obligation to receive care at these facilities.  PASRR submitted to EDS on 10/26/15     PASRR number received on 10/26/15     Existing PASRR number confirmed on       FL2 transmitted to all facilities in geographic area requested by pt/family on 10/26/15     FL2 transmitted to all facilities within larger geographic area on       Patient informed that his/her managed care company has contracts with or will negotiate with certain facilities, including the following:        Yes   Patient/family informed of bed offers received.  Patient chooses bed at [facility]     Physician recommends and patient chooses bed at      Patient to be transferred to [facility] on  .  Patient to be transferred to facility by       Patient family notified on   of transfer.  Name of family member notified:        PHYSICIAN       Additional Comment:    _______________________________________________ Benard Halsted, [place] 10/27/2015, 3:55 PM

## 2015-10-27 NOTE — Progress Notes (Signed)
Pt restless this AM at 01:30. MD paged. Orders received for Ambien 5mg  x1.

## 2015-10-27 NOTE — Progress Notes (Signed)
Physical Therapy Treatment Patient Details Name: [name] MRN: [id number] DOB: 08-14-1928 Today's Date: 10/27/2015    History of Present Illness Mr. [name] is an 82 yo white male with chronic diastolic CHF, coronary artery disease , recurrent SVT s/p DCCV, pulmonary hypertension, type 2 diabetes mellitus, hyperlipidemia, and chronic back pain who recently underwent minimally invasive Mitral Valve Repair 10/04/15 presented with SOB and LE edema 10/17/15.     PT Comments    Spouse present.  Assisted OOB to [facility] to void then amb a limited distance.  Pt very deconditioned and plans to D/C to SNF for ST Rehab.    Follow Up Recommendations  SNF  [place]   Equipment Recommendations       Recommendations for Other Services       Precautions / Restrictions Precautions Precautions: Fall;Sternal Restrictions Weight Bearing Restrictions: No    Mobility  Bed Mobility Overal bed mobility: Needs Assistance Bed Mobility: Supine to Sit     Supine to sit: Min guard;Min assist     General bed mobility comments: used bed rail and bed pad to complete scooting to EOB.    Transfers Overall transfer level: Needs assistance Equipment used: Rolling walker (2 wheeled) Transfers: Sit to/from Stand Sit to Stand: Min assist         General transfer comment: assisted OOB to [facility].  pt tends to pull self up vs push off.  Assisted with hygiene then standing with 75% VC's on proper push off tech.    Ambulation/Gait Ambulation/Gait assistance: Min guard;Min assist Ambulation Distance (Feet): 18 Feet Assistive device: Rolling walker (2 wheeled) Gait Pattern/deviations: Step-through pattern;Decreased stride length;Narrow base of support;Shuffle     General Gait Details: very limited activity tolerance.  Pt was only able to amb from bed to door and back.  MAX c/o weakness/fatugue.     Stairs            Wheelchair Mobility    Modified Rankin (Stroke Patients Only)        Balance                                    Cognition Arousal/Alertness: Awake/alert Behavior During Therapy: WFL for tasks assessed/performed Overall Cognitive Status: Within Functional Limits for tasks assessed                      Exercises      General Comments        Pertinent Vitals/Pain Pain Assessment: Faces Faces Pain Scale: Hurts a little bit Pain Location: R LE Pain Descriptors / Indicators: Grimacing Pain Intervention(s): Monitored during session;Repositioned    Home Living                      Prior Function            PT Goals (current goals can now be found in the care plan section) Progress towards PT goals: Progressing toward goals    Frequency  Min 2X/week    PT Plan Current plan remains appropriate    Co-evaluation             End of Session Equipment Utilized During Treatment: Gait belt Activity Tolerance: Patient limited by fatigue Patient left: in chair;with call bell/phone within reach;with chair alarm set;with family/visitor present     Time: [id number] PT Time Calculation (min) (ACUTE ONLY): 25 min  Charges:  $  Gait Training: 8-22 mins $Therapeutic Activity: 8-22 mins                    G Codes:      Rica Koyanagi  PTA [facility]  Acute  Rehab Pager      [phone]

## 2015-10-27 NOTE — Progress Notes (Signed)
[place] for warfarin Indication: atrial fibrillation  No Known Allergies  Patient Measurements: Height: 5\' 10"  (177.8 cm) Weight: 203 lb 4.8 oz (92.216 kg) (heart up due to that did a bed wt.) IBW/kg (Calculated) : 73 Heparin Dosing Weight: 93  Vital Signs: Temp: 98.5 F (36.9 C) (03/24 0444) Temp Source: Oral (03/24 0444) BP: 91/67 mmHg (03/24 0559) Pulse Rate: 118 (03/24 0559)  Labs:  Recent Labs  10/25/15 0400 10/26/15 0623 10/27/15 0545  HGB 9.0* 9.0* 9.8*  HCT 29.3* 29.0* 32.4*  PLT 192 197 215  LABPROT 20.7* 24.3* 23.9*  INR 1.78* 2.21* 2.16*  HEPARINUNFRC 0.55 0.49  --   CREATININE 1.08 1.22 1.45*    Estimated Creatinine Clearance: 43.3 mL/min (by C-G formula based on Cr of 1.45).  Assessment: 82 yo with Afib on Warfarin PTA- Warfarin Dose: 2 mg daily PTA. Admitted with supra-therapeutic INR and received vitamin K 3/15.  Restarted Warfarin 3/18 with INR goal of 2-2.5 per cards.  INR remains in range today at 2.16.  Hgb and plts both stable- no bleeding noted. Chronic subdural hematoma on head CT on 3/15.  Per Cards MD continue to follow subdural hematoma closely with slow titration of warfarin.  Goal of Therapy:  INR 2-2.5 Monitor platelets by anticoagulation protocol: Yes   Plan:   -Warfarin 2 mg x 1 tonight as per home dose -Daily INR and CBC -Monitor for s/sx of bleeding  Adina Puzzo D. Artavia Jeanlouis, PharmD, BCPS Clinical Pharmacist Pager: [phone] 10/27/2015 11:54 AM

## 2015-10-27 NOTE — Progress Notes (Signed)
Pt with sustained tachycardia in the 120s at 02:30 after previous SR since beginning of shift. EKG shows wide-complex tachycardia in the 120s. MD paged. Orders received for Amiodarone bolus. Will continue to monitor.

## 2015-10-27 NOTE — Progress Notes (Addendum)
      [address]       [place] [address]             [phone]        7 Days Post-Op Procedure(s) (LRB): CARDIOVERSION (N/A)  Subjective: Patient states he felt heart rate go fast. He states his right ankle is less painful and he was able to walk.  Objective: Vital signs in last 24 hours: Temp:  [98.1 F (36.7 C)-98.5 F (36.9 C)] 98.5 F (36.9 C) (03/24 0444) Pulse Rate:  [65-125] 118 (03/24 0559) Cardiac Rhythm:  [-] Junctional rhythm (03/24 0808) Resp:  [20] 20 (03/24 0559) BP: (89-105)/(57-67) 91/67 mmHg (03/24 0559) SpO2:  [95 %-99 %] 97 % (03/24 0559) Weight:  [203 lb 4.8 oz (92.216 kg)] 203 lb 4.8 oz (92.216 kg) (03/24 0456)  Current Weight  10/27/15 203 lb 4.8 oz (92.216 kg)       Intake/Output from previous day: 03/23 0701 - 03/24 0700 In: 720 [P.O.:720] Out: 1375 [Urine:1375]   Physical Exam:  Cardiovascular: Tachycardic, murmur Pulmonary: Slightly diminished at bases Abdomen: Soft, non tender, bowel sounds present. Extremities: ++ bilateral lower extremity edema. Right ankle  Wounds: Sternal incision is clean and dry.  No erythema or signs of infection. Right groin wound superficially dehisced with superficial slough. Very moist in left groin.   Lab Results: CBC:  Recent Labs  10/26/15 0623 10/27/15 0545  WBC 10.3 9.8  HGB 9.0* 9.8*  HCT 29.0* 32.4*  PLT 197 215   BMET:   Recent Labs  10/26/15 0623 10/27/15 0545  NA 138 139  K 4.2 4.6  CL 97* 97*  CO2 31 31  GLUCOSE 124* 123*  BUN 23* 25*  CREATININE 1.22 1.45*  CALCIUM 8.6* 8.7*    PT/INR:  Lab Results  Component Value Date   INR 2.16* 10/27/2015   INR 2.21* 10/26/2015   INR 1.78* 10/25/2015   ABG:  INR: Will add last result for INR, ABG once components are confirmed Will add last 4 CBG results once components are confirmed  Assessment/Plan:  1. CV - S/p mini MV repair on 10/04/2015. Previous A fib.Had wide complex ST earlier. Given IV  Amiodarone bolus  this am.HR in the 120's this am.On Amiodarone 200 mg daily and Lopressor 12.5 mg bid, and Coumadin 2.5 mg daily. Will defer to cardiology is should try to increase Lopressor to 25 mg bid since BP remains labile.  INR  2.16. 2. Volume Overload - On 80 mg IV Lasix BID and Zaroxolyn 2.5 mg daily 3.  Normocytic anemia - H and H stable at 9.8 and 32.4 4. Right groin wound is superficially dehisced andyellow slough. There is no dry 4 x 4 gauze in there. Will speak with nursing about importance of keeping that area dry. 5.Continue Colchicine for probable right ankle gout   [name],DONIELLE MPA-C 10/27/2015,8:16 AM   I have seen and examined the patient and agree with the assessment and plan as outlined.  Episode PAF overnight.  Currently maintaining NSR.  Very weak and debilitated.  Groin incision stable but wound care hasn't been occurring as ordered.  Rexene Alberts, MD 10/27/2015 11:33 AM

## 2015-10-27 NOTE — Progress Notes (Signed)
Pt refused 1200 CBG.  Karie Kirks, Therapist, sports.

## 2015-10-28 DIAGNOSIS — Z9889 Other specified postprocedural states: Secondary | ICD-10-CM

## 2015-10-28 LAB — BASIC METABOLIC PANEL
Anion gap: 11 (ref 5–15)
BUN: 34 mg/dL — AB (ref 6–20)
CALCIUM: 8.8 mg/dL — AB (ref 8.9–10.3)
CO2: 29 mmol/L (ref 22–32)
CREATININE: 1.98 mg/dL — AB (ref 0.61–1.24)
Chloride: 99 mmol/L — ABNORMAL LOW (ref 101–111)
GFR calc non Af Amer: 29 mL/min — ABNORMAL LOW (ref >60)
GFR, EST AFRICAN AMERICAN: 34 mL/min — AB (ref >60)
Glucose, Bld: 147 mg/dL — ABNORMAL HIGH (ref 65–99)
Potassium: 4.5 mmol/L (ref 3.5–5.1)
SODIUM: 139 mmol/L (ref 135–145)

## 2015-10-28 LAB — PROTIME-INR
INR: 2.3 — AB (ref 0.00–1.49)
PROTHROMBIN TIME: 25.1 s — AB (ref 11.6–15.2)

## 2015-10-28 LAB — GLUCOSE, CAPILLARY
GLUCOSE-CAPILLARY: 129 mg/dL — AB (ref 65–99)
GLUCOSE-CAPILLARY: 118 mg/dL — AB (ref 65–99)
Glucose-Capillary: 142 mg/dL — ABNORMAL HIGH (ref 65–99)

## 2015-10-28 LAB — CBC
HEMATOCRIT: 33 % — AB (ref 39.0–52.0)
Hemoglobin: 10 g/dL — ABNORMAL LOW (ref 13.0–17.0)
MCH: 28.5 pg (ref 26.0–34.0)
MCHC: 30.3 g/dL (ref 30.0–36.0)
MCV: 94 fL (ref 78.0–100.0)
Platelets: 245 10*3/uL (ref 150–400)
RBC: 3.51 MIL/uL — ABNORMAL LOW (ref 4.22–5.81)
RDW: 15.1 % (ref 11.5–15.5)
WBC: 11.7 10*3/uL — ABNORMAL HIGH (ref 4.0–10.5)

## 2015-10-28 MED ORDER — COLCHICINE 0.6 MG PO TABS
0.6000 mg | ORAL_TABLET | Freq: Every day | ORAL | Status: DC
  Administered 2015-10-29 – 2015-10-31 (×3): 0.6 mg via ORAL
  Filled 2015-10-28 (×3): qty 1

## 2015-10-28 MED ORDER — WARFARIN SODIUM 2 MG PO TABS
2.0000 mg | ORAL_TABLET | Freq: Once | ORAL | Status: AC
  Administered 2015-10-28: 2 mg via ORAL
  Filled 2015-10-28: qty 1

## 2015-10-28 NOTE — Progress Notes (Signed)
[place] for warfarin Indication: atrial fibrillation  No Known Allergies  Patient Measurements: Height: 5\' 10"  (177.8 cm) Weight: 204 lb (92.534 kg) IBW/kg (Calculated) : 73 Heparin Dosing Weight: 93  Vital Signs: Temp: 98.3 F (36.8 C) (03/25 0434) Temp Source: Oral (03/25 0434) BP: 111/65 mmHg (03/25 1044) Pulse Rate: 84 (03/25 1044)  Labs:  Recent Labs  10/26/15 0623 10/27/15 0545 10/28/15 0258  HGB 9.0* 9.8* 10.0*  HCT 29.0* 32.4* 33.0*  PLT 197 215 245  LABPROT 24.3* 23.9* 25.1*  INR 2.21* 2.16* 2.30*  HEPARINUNFRC 0.49  --   --   CREATININE 1.22 1.45*  --     Estimated Creatinine Clearance: 43.3 mL/min (by C-G formula based on Cr of 1.45).  Assessment: 82 yo with Afib on Warfarin PTA- Warfarin Dose: 2 mg daily PTA. Admitted with supra-therapeutic INR and received vitamin K 3/15.  Restarted Warfarin 3/18 with INR goal of 2-2.5 per cards.  INR today at 2.3. Has been consistently therapeutic at current coumadin dose. Hgb stable, plt 245. No reported bleeding.    DDI: Amiodarone  Chronic subdural hematoma on head CT on 3/15.  Per Cards MD continue to follow subdural hematoma closely with slow titration of warfarin.  Goal of Therapy:  INR 2-2.5 Monitor platelets by anticoagulation protocol: Yes   Plan:   -Warfarin 2 mg x 1 tonight as per home dose -Daily INR and CBC -Monitor for s/sx of bleeding  Stephens November, PharmD Clinical Pharmacy Resident 10/28/2015 11:01 AM

## 2015-10-28 NOTE — Progress Notes (Signed)
SUBJECTIVE: Denies chest pain. Had SOB earlier when tachycardic, now in sinus rhythm.     Intake/Output Summary (Last 24 hours) at 10/28/15 1052 Last data filed at 10/28/15 0600  Gross per 24 hour  Intake    480 ml  Output    351 ml  Net    129 ml    Current Facility-Administered Medications  Medication Dose Route Frequency Provider Last Rate Last Dose  . 0.9 %  sodium chloride infusion  250 mL Intravenous PRN Soyla Dryer, MD 10 mL/hr at 10/20/15 1238 500 mL at 10/20/15 1238  . acetaminophen (TYLENOL) tablet 1,000 mg  1,000 mg Oral Q6H PRN Soyla Dryer, MD   1,000 mg at 10/27/15 0155  . amiodarone (PACERONE) tablet 400 mg  400 mg Oral BID Fay Records, MD   400 mg at 10/28/15 1047  . antiseptic oral rinse (CPC / CETYLPYRIDINIUM CHLORIDE 0.05%) solution 7 mL  7 mL Mouth Rinse BID Soyla Dryer, MD   7 mL at 10/27/15 2200  . aspirin EC tablet 81 mg  81 mg Oral Daily Soyla Dryer, MD   81 mg at 10/28/15 1047  . colchicine tablet 0.6 mg  0.6 mg Oral BID Nani Skillern, PA-C   0.6 mg at 10/28/15 1046  . feeding supplement (ENSURE ENLIVE) (ENSURE ENLIVE) liquid 237 mL  237 mL Oral BID Soyla Dryer, MD   237 mL at 10/28/15 1044  . finasteride (PROSCAR) tablet 5 mg  5 mg Oral Daily Soyla Dryer, MD   5 mg at 10/28/15 1046  . furosemide (LASIX) injection 80 mg  80 mg Intravenous Daily Fay Records, MD   80 mg at 10/28/15 1044  . metolazone (ZAROXOLYN) tablet 2.5 mg  2.5 mg Oral Daily Dorris Carnes V, MD   2.5 mg at 10/28/15 1045  . metoprolol tartrate (LOPRESSOR) tablet 12.5 mg  12.5 mg Oral BID Nani Skillern, PA-C   12.5 mg at 10/28/15 1045  . multivitamin with minerals tablet 1 tablet  1 tablet Oral Daily Soyla Dryer, MD   1 tablet at 10/28/15 1046  . nutrition supplement (JUVEN) (JUVEN) powder packet 1 packet  1 packet Oral BID WC Soyla Dryer, MD   1 packet at 10/28/15 1048  . ondansetron (ZOFRAN) injection 4 mg  4 mg Intravenous Q6H PRN  Soyla Dryer, MD      . potassium chloride SA (K-DUR,KLOR-CON) CR tablet 40 mEq  40 mEq Oral Daily Fay Records, MD   40 mEq at 10/28/15 1047  . pravastatin (PRAVACHOL) tablet 40 mg  40 mg Oral q1800 Soyla Dryer, MD   40 mg at 10/27/15 1828  . sodium chloride flush (NS) 0.9 % injection 3 mL  3 mL Intravenous Q12H Soyla Dryer, MD   3 mL at 10/28/15 1050  . sodium chloride flush (NS) 0.9 % injection 3 mL  3 mL Intravenous PRN Soyla Dryer, MD      . tamsulosin [facility]) capsule 0.4 mg  0.4 mg Oral Daily Soyla Dryer, MD   0.4 mg at 10/28/15 1047  . Warfarin - Pharmacist Dosing Inpatient   Does not apply q1800 Kem Parkinson, RPH        Filed Vitals:   10/27/15 1946 10/27/15 2029 10/28/15 0434 10/28/15 1044  BP: 83/59 89/67 87/65  111/65  Pulse: 108  127 84  Temp: 98.1 F (36.7 C)  98.3 F (  36.8 C)   TempSrc: Oral  Oral   Resp: 18  21   Height:      Weight:   204 lb (92.534 kg)   SpO2: 97%  95%     PHYSICAL EXAM General: NAD HEENT: Normal. Neck: No JVD, no thyromegaly.  Lungs: Faint rales at bases. No wheezes. CV: Nondisplaced PMI.  Regular rate and rhythm, normal S1/S2, no [id number], 2/6 pansystolic murmur along left sternal border.  Trace periankle edema.   Abdomen: Soft, nontender, no distention.  Neurologic: Alert and oriented.  Psych: Normal affect. Musculoskeletal: No gross deformities. Extremities: No clubbing or cyanosis.   TELEMETRY: Reviewed telemetry pt in sinus rhythm.  LABS: Basic Metabolic Panel:  Recent Labs  10/26/15 0623 10/27/15 0545  NA 138 139  K 4.2 4.6  CL 97* 97*  CO2 31 31  GLUCOSE 124* 123*  BUN 23* 25*  CREATININE 1.22 1.45*  CALCIUM 8.6* 8.7*   Liver Function Tests: No results for input(s): AST, ALT, ALKPHOS, BILITOT, PROT, ALBUMIN in the last 72 hours. No results for input(s): LIPASE, AMYLASE in the last 72 hours. CBC:  Recent Labs  10/27/15 0545 10/28/15 0258  WBC 9.8 11.7*  HGB 9.8* 10.0*  HCT 32.4* 33.0*  MCV  93.6 94.0  PLT 215 245   Cardiac Enzymes: No results for input(s): CKTOTAL, CKMB, CKMBINDEX, TROPONINI in the last 72 hours. BNP: Invalid input(s): POCBNP D-Dimer: No results for input(s): DDIMER in the last 72 hours. Hemoglobin A1C: No results for input(s): HGBA1C in the last 72 hours. Fasting Lipid Panel: No results for input(s): CHOL, HDL, LDLCALC, TRIG, CHOLHDL, LDLDIRECT in the last 72 hours. Thyroid Function Tests: No results for input(s): TSH, T4TOTAL, T3FREE, THYROIDAB in the last 72 hours.  Invalid input(s): FREET3 Anemia Panel: No results for input(s): VITAMINB12, FOLATE, FERRITIN, TIBC, IRON, RETICCTPCT in the last 72 hours.  RADIOLOGY: Dg Chest 2 View  10/25/2015  CLINICAL DATA:  Atelectasis EXAM: CHEST  2 VIEW COMPARISON:  10/19/2015 chest radiograph. FINDINGS: Stable cardiomediastinal silhouette with mild-to-moderate cardiomegaly. No pneumothorax. Stable small to moderate right pleural effusion. No left pleural effusion. No overt pulmonary edema. Stable patchy right lung base opacity. IMPRESSION: 1. Stable mild-to-moderate cardiomegaly without overt pulmonary edema. 2. Stable small to moderate right pleural effusion with nonspecific right lung base patchy opacity, favor atelectasis. Electronically Signed   By: Ilona Sorrel M.D.   On: 10/25/2015 10:32   Dg Chest 2 View  10/19/2015  CLINICAL DATA:  Shortness of breath. EXAM: CHEST  2 VIEW COMPARISON:  October 17, 2015. FINDINGS: Stable cardiomegaly. No pneumothorax is noted. Left lung is clear. Stable right basilar opacity is noted concerning for edema, pneumonia or atelectasis with moderate associated pleural effusion. Bony thorax is unremarkable. IMPRESSION: Stable right basilar opacity concerning for pneumonia, atelectasis or edema with associated pleural effusion. Electronically Signed   By: Marijo Conception, M.D.   On: 10/19/2015 08:10   Dg Chest 2 View  10/12/2015  CLINICAL DATA:  82 year old male with recent cardiac  surgery. Recent right chest tube for pneumothorax. Initial encounter. EXAM: CHEST  2 VIEW COMPARISON:  10/11/2015 and earlier. FINDINGS: Right chest tube removed. Trace if any right apical pneumothorax today, regressed since yesterday. Streaky peripheral and basilar opacity on the right has not significantly changed. Mildly improved lung volumes. Stable cardiac size and mediastinal contours. The patient arms are raised on the lateral view limiting its utility. Small left pleural effusion. No acute pulmonary edema. No areas of worsening ventilation. Stable  visualized osseous structures. Flowing osteophytes or syndesmophytes in the thoracic spine. IMPRESSION: 1. Right chest tube removed with trace if any residual right apical pneumothorax. 2. Small pleural effusions. Stable patchy peripheral and basilar opacity in the right lung. Electronically Signed   By: Genevie Ann M.D.   On: 10/12/2015 07:57   Dg Chest 2 View  10/11/2015  CLINICAL DATA:  Chest discomfort, chest tube in place on the right, recent mitral valve repair and subsequent wound exploration. EXAM: CHEST  2 VIEW COMPARISON:  Portable chest x-ray of October 09, 2015 FINDINGS: There is persistent mild volume loss on the right. There is a pneumothorax on the right amounting to approximately 10% or less of the lung volume. There is infiltrate in the right upper lobe which accentuates the pleural line. Patchy interstitial density in the right mid and lower lung is stable. The upper right-sided chest tube tip projects over the posterior aspect of the fifth rib. The lower chest tube tip is not well demonstrated but appears to lie in the inferior aspect of the right pleural space. There remains a small right pleural effusion. There is no mediastinal shift. On the left there is basilar atelectasis and possible trace pleural effusion. The mid and upper lung are clear. The cardiac silhouette remains enlarged and the central pulmonary vascularity remains engorged.  IMPRESSION: 10% or less right apical pneumothorax. The right-sided chest tubes are in stable position. Persistent interstitial and alveolar opacities on the right. Minimal left basilar atelectasis. Stable cardiomegaly with mild central pulmonary vascular prominence. Critical Value/emergent results were called by telephone at the time of interpretation on 10/11/2015 at 7:54 am to Shirley Muscat, RN, , who verbally acknowledged these results. Electronically Signed   By: David  Martinique M.D.   On: 10/11/2015 07:50   Dg Chest 2 View  10/02/2015  CLINICAL DATA:  Preoperative assessment for cardiac surgery ; history hypertension, coronary artery disease, pulmonary hypertension, SVT, mitral regurgitation, chronic diastolic congestive heart failure EXAM: CHEST  2 VIEW COMPARISON:  07/05/2015 FINDINGS: Enlargement of cardiac silhouette. Mild atherosclerotic calcification aorta. Mediastinal contours and pulmonary vascularity normal. Lungs clear. No pleural effusion or pneumothorax. Diffuse osseous demineralization. IMPRESSION: Enlargement of cardiac silhouette. No acute abnormalities. Electronically Signed   By: Lavonia Dana M.D.   On: 10/02/2015 14:48   Ct Head Wo Contrast  10/18/2015  CLINICAL DATA:  Shortness of breath, altered mental status after heart surgery 2 weeks ago. History of diabetes, hypertension, prostate cancer. EXAM: CT HEAD WITHOUT CONTRAST TECHNIQUE: Contiguous axial images were obtained from the base of the skull through the vertex without intravenous contrast. COMPARISON:  CT head November 24, 2012 FINDINGS: The ventricles and sulci are normal for age. Symmetric basal ganglia mineralization. No intraparenchymal hemorrhage. Patchy supratentorial white matter hypodensities are within normal range for patient's age and though non-specific suggest sequelae of chronic small vessel ischemic disease. No acute large vascular territory infarcts. RIGHT holo hemispheric cerebral spinal fluid equivalent subdural  collection was 11 mm, now 14 mm. Mass effect on subjacent sulci without midline shift. Basal cisterns are patent. Minimal calcific atherosclerosis of the carotid siphons. No skull fracture. The included ocular globes and orbital contents are non-suspicious. Status post bilateral ocular lens implants. Bilateral maxillary sinus mucosal retention cysts without air-fluid levels. Imaged mastoid air cells are well aerated. Patient is edentulous. IMPRESSION: 14 mm RIGHT holo hemispheric chronic subdural hematoma without midline shift. Otherwise negative CT head for age. Electronically Signed   By: Elon Alas M.D.   On:  10/18/2015 01:47   US Abdomen Complete  10/18/2015  CLINICAL DATA:  Acute onset of elevated LFTs.  Initial encounter. EXAM: ABDOMEN ULTRASOUND COMPLETE COMPARISON:  CTA of the abdomen and pelvis from 08/02/2015 FINDINGS: Gallbladder: Stones and sludge are noted within the gallbladder. Gallbladder wall thickening is noted, measuring up to 5 mm. No ultrasonographic Murphy's sign is elicited. No pericholecystic fluid is seen. Common bile duct: Diameter: 0.5 cm, within normal limits in caliber. Liver: No focal lesion identified. Somewhat coarsened hepatic echotexture, raising concern for fatty infiltration. The hepatic contour is slightly nodular, raising question for mild hepatic cirrhosis. IVC: No abnormality visualized. Pancreas: Visualized portion unremarkable. Spleen: Size and appearance within normal limits. Right Kidney: Length: 11.0 cm. Echogenicity within normal limits. No mass or hydronephrosis visualized. A small amount of fluid is noted between the liver and right kidney. Left Kidney: Length: 10.7 cm. Echogenicity within normal limits. No mass or hydronephrosis visualized. Abdominal aorta: No aneurysm visualized. Not well characterized proximally or distally due to overlying structures. Other findings: None. IMPRESSION: 1. Stones and sludge within the gallbladder. Gallbladder wall  thickening noted. This may reflect chronic inflammation, though would correlate with the patient's symptoms to exclude acute cholecystitis. No evidence of obstruction. 2. Slightly nodular hepatic contour raises question for mild hepatic cirrhosis. Underlying fatty infiltration noted. 3. Trace fluid between the liver and right kidney reflects minimal ascites. Electronically Signed   By: Garald Balding M.D.   On: 10/18/2015 00:58   Dg Chest Port 1 View  10/17/2015  CLINICAL DATA:  Shortness of breath, bilateral lower extremity edema for 2 weeks. Open heart surgery 2 weeks ago. History of hypertension, CHF, diabetes, coronary artery disease, dysrhythmia EXAM: PORTABLE CHEST 1 VIEW COMPARISON:  Chest x-rays dated 10/12/2015 and 10/09/2015. FINDINGS: Dense opacity at the right lung base is slightly more prominent on today's study, most likely a combination of atelectasis and small pleural effusion. Left lung remains clear. Cardiomegaly is stable. No pneumothorax seen. No acute osseous abnormality seen. Degenerative changes again noted about the right shoulder. IMPRESSION: Dense opacity at the right lung base appears slightly more prominent on today's study, most likely a combination of atelectasis and small pleural effusion, presumably with a slight interval increase compared to the previous exam. Stable cardiomegaly. Electronically Signed   By: Franki Cabot M.D.   On: 10/17/2015 19:18   Dg Chest Port 1 View  10/09/2015  CLINICAL DATA:  Status post mitral valve repair EXAM: PORTABLE CHEST 1 VIEW COMPARISON:  10/08/2015 FINDINGS: The cardiac shadow is enlarged but stable. Two right-sided chest tubes are again seen and stable no pneumothorax is noted. Right basilar atelectasis is noted slightly progressed when compare with the prior exam. No bony abnormality is noted. IMPRESSION: Increased right basilar atelectatic changes. Thoracostomy catheters without pneumothorax. Although not mentioned in the body of the report,  a left jugular sheath remains. Electronically Signed   By: Inez Catalina M.D.   On: 10/09/2015 07:16   Dg Chest Port 1 View  10/08/2015  CLINICAL DATA:  Post MVR 10/04/2015, history mitral valve prolapse, coronary artery disease, diabetes mellitus, hypertension, former smoker, pulmonary hypertension, chronic diastolic heart failure EXAM: PORTABLE CHEST 1 VIEW COMPARISON:  Portable exam 0512 hours compared to 10/07/2015 FINDINGS: RIGHT thoracostomy tubes unchanged. LEFT jugular central venous catheter unchanged. Epicardial pacing leads noted. Enlargement of cardiac silhouette with pulmonary vascular congestion. Persistent atelectasis and minimal effusion at mid inferior RIGHT chest. Mild LEFT basilar atelectasis and small LEFT pleural effusion. No definite pneumothorax. IMPRESSION:  RIGHT thoracostomy tubes with persistent basilar atelectasis and small effusion without pneumothorax per enlargement of cardiac silhouette with pulmonary vascular congestion. Persistent mile LEFT basilar atelectasis and small pleural effusion. Electronically Signed   By: Lavonia Dana M.D.   On: 10/08/2015 07:14   Dg Chest Port 1 View  10/07/2015  CLINICAL DATA:  Congestive heart failure. EXAM: PORTABLE CHEST 1 VIEW COMPARISON:  October 06, 2015 FINDINGS: The PA catheter has been removed with a left IJ sheath remaining. Other support apparatus is stable. No pneumothorax identified. Bilateral layering effusions with underlying atelectasis are stable. More diffuse opacity remains in the right mid and lower lung. Stable cardiomegaly. No other interval changes. IMPRESSION: Interval removal of the PA catheter. Small bilateral layering effusions with underlying atelectasis. Persistent opacity in the right mid and lower lung. Electronically Signed   By: Dorise Bullion III M.D   On: 10/07/2015 07:02   Dg Chest Port 1 View  10/06/2015  CLINICAL DATA:  Status postextubation.  Recent mitral valve repair EXAM: PORTABLE CHEST 1 VIEW COMPARISON:  October 05, 2015 FINDINGS: Endotracheal tube is in nasogastric tube have been removed. Swan-Ganz catheter tip is in the main pulmonary outflow tract. There is a chest tube on the right. Temporary pacemaker wires are attached to the right heart. No pneumothorax. There is persistent right effusion with right middle and lower lobe volume loss with patchy airspace opacity in these areas. There is also patchy airspace opacity in the left lower lobe. There is cardiomegaly. The pulmonary vascularity shows evidence of mild pulmonary venous hypertension. No adenopathy evident. IMPRESSION: Tube and catheter positions as described without pneumothorax. Findings consistent with a degree of congestive heart failure. Atelectasis and possibly pneumonia in the right middle and both lower lobes cannot be excluded. The appearance is similar to 1 day prior. Electronically Signed   By: Lowella Grip III M.D.   On: 10/06/2015 07:55   Dg Chest Port 1 View  10/05/2015  CLINICAL DATA:  Status post mitral valve repair postop 1 and sternal re- incision. EXAM: PORTABLE CHEST 1 VIEW COMPARISON:  10/05/2015 and prior exam FINDINGS: An endotracheal tube with tip 4.2 cm above the carina, left IJ Swan-Ganz catheter with tip overlying the main pulmonary artery, and mediastinal/right thoracostomy tubes again noted. Improved right lung aeration noted with decreased edema/ airspace opacity. Bilateral lower lung opacities/ atelectasis/consolidation again noted. There is no evidence of pneumothorax. No other significant changes identified. IMPRESSION: Improved aeration bilaterally with decreased right lung edema/airspace opacity. Support apparatus as described.  No evidence of pneumothorax. Continued bilateral lower lung opacities. Electronically Signed   By: Margarette Canada M.D.   On: 10/05/2015 18:31   Dg Chest Port 1 View  10/05/2015  CLINICAL DATA:  Atelectasis. EXAM: PORTABLE CHEST 1 VIEW COMPARISON:  10/04/2015. FINDINGS: Endotracheal tube 1.7 cm above  the carina, slight proximal repositioning should be considered. Swan-Ganz catheter noted with tip in the pulmonary outflow tract. NG tube noted with tip below left hemidiaphragm. Right chest tubes and mediastinal drainage catheter in stable position. Stable cardiomegaly. Cardiac valve replacement. Progressive bibasilar atelectasis and/or infiltrates.No pneumothorax. Small bilateral pleural effusions. No acute bony abnormality . Cervical spine fusion. IMPRESSION: 1. Endotracheal tube 1.7 cm above the carina, slight proximal repositioning should be considered. Remaining lines and tubes including right chest tubes in stable position. No pneumothorax 2. Low lung volumes with progressive bibasilar atelectasis and/or infiltrates/edema. Small bilateral pleural effusions. 3.  Persistent cardiomegaly.  Prior cardiac valve replacement. Electronically Signed   By:  [place]   On: 10/05/2015 08:06   Dg Chest Port 1 View  10/04/2015  CLINICAL DATA:  Minimally invasive mitral valve repair a EXAM: PORTABLE CHEST 1 VIEW COMPARISON:  10/02/2015 FINDINGS: Endotracheal tube 4 cm from carina. NG tube in stomach. Swan-Ganz catheter with tip in the main pulmonary artery. Two RIGHT chest tubes in place. Volume loss in the RIGHT hemi thorax. This atelectasis the RIGHT upper lobe RIGHT lower lobe with small effusion. No pneumothorax. LEFT lung clear. IMPRESSION: 1. Support apparatus appears in good position. 2. RIGHT lung atelectasis, effusion and 2 chest tubes in RIGHT hemi thorax. Electronically Signed   By: Suzy Bouchard M.D.   On: 10/04/2015 17:41      ASSESSMENT AND PLAN: 1. Acute on chronic diastolic heart failure: Will check BMET to see if diuretic regimen needs to be adjusted. Has been hypotensive. Currently on IV Lasix and oral metolazone. Uncertain about I/O accuracy.  2. Atrial fibrillation: Currently in sinus rhythm. Continue amiodarone 400 mg bid and warfarin. Continue metoprolol 12.5 mg bid.  3. S/p MV  repair.   Kate Sable, M.D., F.A.C.C.

## 2015-10-28 NOTE — Progress Notes (Signed)
      [address]       [place] [address]             [phone]        8 Days Post-Op Procedure(s) (LRB): CARDIOVERSION (N/A)  Subjective: Patient   Objective: Vital signs in last 24 hours: Temp:  [98.1 F (36.7 C)-98.3 F (36.8 C)] 98.3 F (36.8 C) (03/25 0434) Pulse Rate:  [66-127] 84 (03/25 1044) Cardiac Rhythm:  [-] Junctional rhythm (03/25 0754) Resp:  [18-21] 21 (03/25 0434) BP: (83-111)/(53-67) 111/65 mmHg (03/25 1044) SpO2:  [95 %-98 %] 95 % (03/25 0434) Weight:  [204 lb (92.534 kg)] 204 lb (92.534 kg) (03/25 0434)  Current Weight  10/28/15 204 lb (92.534 kg)       Intake/Output from previous day: 03/24 0701 - 03/25 0700 In: 480 [P.O.:480] Out: 351 [Urine:350; Stool:1]   Physical Exam:  Cardiovascular: RRR, murmur Pulmonary: Slightly diminished at bases Abdomen: Soft, non tender, bowel sounds present. Extremities: Mild bilateral lower extremity edema, but decreased a lot over last week. Right ankle much less tender Wounds: Sternal incision is clean and dry.  No erythema or signs of infection. Right groin wound superficially dehisced with superficial slough. Very moist in left groin.   Lab Results: CBC:  Recent Labs  10/27/15 0545 10/28/15 0258  WBC 9.8 11.7*  HGB 9.8* 10.0*  HCT 32.4* 33.0*  PLT 215 245   BMET:   Recent Labs  10/27/15 0545 10/28/15 1103  NA 139 139  K 4.6 4.5  CL 97* 99*  CO2 31 29  GLUCOSE 123* 147*  BUN 25* 34*  CREATININE 1.45* 1.98*  CALCIUM 8.7* 8.8*    PT/INR:  Lab Results  Component Value Date   INR 2.30* 10/28/2015   INR 2.16* 10/27/2015   INR 2.21* 10/26/2015   ABG:  INR: Will add last result for INR, ABG once components are confirmed Will add last 4 CBG results once components are confirmed  Assessment/Plan:  1. CV - S/p mini MV repair on 10/04/2015. Previous A fibOn Amiodarone 400 mg daily and Lopressor 12.5 mg bid, and Coumadin 2.5 mg daily.  INR  2.3 per pharmacy. 2.  Volume Overload - On 80 mg IV Lasix BID and Zaroxolyn 2.5 mg daily. Creatinine up to 1.98. Diuretics have been stopped for now. 3.  Normocytic anemia - H and H stable at 9.8 and 32.4 4. Right groin wound is superficially dehisced and yellow slough. There is no dry 4 x 4 gauze in there. Wound care instructions now being followed. 5.Continue Colchicine for probable right ankle gout. Will decrease dose as renal function worse 6. Continue Ensure supplement but will stop Juven powder as he does not like it so is not taking it.  Neel Buffone MPA-C 10/28/2015,1:40 PM

## 2015-10-29 LAB — CBC
HEMATOCRIT: 31.9 % — AB (ref 39.0–52.0)
HEMOGLOBIN: 9.7 g/dL — AB (ref 13.0–17.0)
MCH: 28.7 pg (ref 26.0–34.0)
MCHC: 30.4 g/dL (ref 30.0–36.0)
MCV: 94.4 fL (ref 78.0–100.0)
Platelets: 213 10*3/uL (ref 150–400)
RBC: 3.38 MIL/uL — ABNORMAL LOW (ref 4.22–5.81)
RDW: 15.2 % (ref 11.5–15.5)
WBC: 9.3 10*3/uL (ref 4.0–10.5)

## 2015-10-29 LAB — GLUCOSE, CAPILLARY
GLUCOSE-CAPILLARY: 138 mg/dL — AB (ref 65–99)
GLUCOSE-CAPILLARY: 103 mg/dL — AB (ref 65–99)
Glucose-Capillary: 102 mg/dL — ABNORMAL HIGH (ref 65–99)
Glucose-Capillary: 96 mg/dL (ref 65–99)

## 2015-10-29 LAB — PROTIME-INR
INR: 2.75 — ABNORMAL HIGH (ref 0.00–1.49)
Prothrombin Time: 28.7 seconds — ABNORMAL HIGH (ref 11.6–15.2)

## 2015-10-29 NOTE — Progress Notes (Signed)
      [address]       [place] [address]             [phone]        9 Days Post-Op Procedure(s) (LRB): CARDIOVERSION (N/A)  Subjective: Patient states feels like food is getting stuck in his throat. He says it is because of his pills.  Objective: Vital signs in last 24 hours: Temp:  [98.1 F (36.7 C)-98.4 F (36.9 C)] 98.3 F (36.8 C) (03/26 0530) Pulse Rate:  [74-87] 74 (03/26 0530) Cardiac Rhythm:  [-] Junctional rhythm (03/26 0700) Resp:  [18-22] 18 (03/26 0530) BP: (92-120)/(51-68) 96/68 mmHg (03/26 0957) SpO2:  [94 %-98 %] 98 % (03/26 0530) Weight:  [203 lb (92.08 kg)] 203 lb (92.08 kg) (03/26 0530)  Current Weight  10/29/15 203 lb (92.08 kg)       Intake/Output from previous day: 03/25 0701 - 03/26 0700 In: 360 [P.O.:360] Out: 200 [Urine:200]   Physical Exam:  Cardiovascular: Tachycardic murmur Pulmonary: Slightly diminished at bases Abdomen: Soft, non tender, bowel sounds present. Extremities: Mild bilateral lower extremity edema, but decreased a lot over last week. Right ankle much less tender Wounds: Sternal incision is clean and dry.  No erythema or signs of infection. Right groin wound superficially dehisced with superficial slough. Very moist in left groin this am.  Lab Results: CBC:  Recent Labs  10/28/15 0258 10/29/15 0310  WBC 11.7* 9.3  HGB 10.0* 9.7*  HCT 33.0* 31.9*  PLT 245 213   BMET:   Recent Labs  10/27/15 0545 10/28/15 1103  NA 139 139  K 4.6 4.5  CL 97* 99*  CO2 31 29  GLUCOSE 123* 147*  BUN 25* 34*  CREATININE 1.45* 1.98*  CALCIUM 8.7* 8.8*    PT/INR:  Lab Results  Component Value Date   INR 2.75* 10/29/2015   INR 2.30* 10/28/2015   INR 2.16* 10/27/2015   ABG:  INR: Will add last result for INR, ABG once components are confirmed Will add last 4 CBG results once components are confirmed  Assessment/Plan:  1. CV - S/p mini MV repair on 10/04/2015. Previous A fib. On Amiodarone 400  mg daily and Lopressor 12.5 mg bid, and Coumadin 2 mg daily.  INR  Now up to 2.75 per pharmacy. 2. Volume Overload - On 80 mg IV Lasix BID and Zaroxolyn 2.5 mg daily. Creatinine up to 1.98. Diuretics have been stopped for now. 3.  Normocytic anemia - H and H stable at 9.7 and 31.9 4. Right groin wound is superficially dehisced and yellow slough. There is no dry 4 x 4 gauze in there. Wound care instructions now being followed. Spoke with nurse and she is going to change dressing after he takes his medication. 5.Continue daily Colchicine for probable right ankle gout. 6. Will ask speech path to do swallow study  [name],DONIELLE MPA-C 10/29/2015,10:13 AM

## 2015-10-29 NOTE — Progress Notes (Signed)
[facility] for warfarin Indication: atrial fibrillation  No Known Allergies  Patient Measurements: Height: 5\' 10"  (177.8 cm) Weight: 203 lb (92.08 kg) IBW/kg (Calculated) : 73 Heparin Dosing Weight: 93  Vital Signs: Temp: 98.3 F (36.8 C) (03/26 0530) Temp Source: Oral (03/26 0530) BP: 96/68 mmHg (03/26 0957) Pulse Rate: 74 (03/26 0530)  Labs:  Recent Labs  10/27/15 0545 10/28/15 0258 10/28/15 1103 10/29/15 0310  HGB 9.8* 10.0*  --  9.7*  HCT 32.4* 33.0*  --  31.9*  PLT 215 245  --  213  LABPROT 23.9* 25.1*  --  28.7*  INR 2.16* 2.30*  --  2.75*  CREATININE 1.45*  --  1.98*  --     Estimated Creatinine Clearance: 31.7 mL/min (by C-G formula based on Cr of 1.98).  Assessment: 82 yo with Afib on Warfarin PTA- Warfarin Dose: 2 mg daily PTA. Admitted with supra-therapeutic INR and received vitamin K 3/15.  Restarted Warfarin 3/18 with INR goal of 2-2.5 per cards.  INR today at 2.75 despite previously consistent therapeutic INRs. Hgb stable, plt 213. No reported bleeding.   DDI: Amiodarone  Chronic subdural hematoma on head CT on 3/15.  Per Cards MD continue to follow subdural hematoma closely with slow titration of warfarin.  Goal of Therapy:  INR 2-2.5 Monitor platelets by anticoagulation protocol: Yes   Plan:   -Hold warfarin tonight - expect he will need some combination of 2mg  and 1mg  doses upon discharge -Daily INR and CBC -Monitor for s/sx of bleeding  Stephens November, PharmD Clinical Pharmacy Resident 10/29/2015 12:00 PM

## 2015-10-30 ENCOUNTER — Inpatient Hospital Stay (HOSPITAL_COMMUNITY): Payer: Medicare Other

## 2015-10-30 ENCOUNTER — Encounter: Payer: Medicare Other | Admitting: Thoracic Surgery (Cardiothoracic Vascular Surgery)

## 2015-10-30 LAB — GLUCOSE, CAPILLARY
GLUCOSE-CAPILLARY: 90 mg/dL (ref 65–99)
Glucose-Capillary: 95 mg/dL (ref 65–99)
Glucose-Capillary: 134 mg/dL — ABNORMAL HIGH (ref 65–99)
Glucose-Capillary: 121 mg/dL — ABNORMAL HIGH (ref 65–99)

## 2015-10-30 LAB — BASIC METABOLIC PANEL
Anion gap: 9 (ref 5–15)
BUN: 37 mg/dL — AB (ref 6–20)
CALCIUM: 8.5 mg/dL — AB (ref 8.9–10.3)
CO2: 29 mmol/L (ref 22–32)
Chloride: 101 mmol/L (ref 101–111)
Creatinine, Ser: 1.92 mg/dL — ABNORMAL HIGH (ref 0.61–1.24)
GFR calc Af Amer: 35 mL/min — ABNORMAL LOW (ref >60)
GFR calc non Af Amer: 30 mL/min — ABNORMAL LOW (ref >60)
GLUCOSE: 108 mg/dL — AB (ref 65–99)
Potassium: 3.9 mmol/L (ref 3.5–5.1)
Sodium: 139 mmol/L (ref 135–145)

## 2015-10-30 LAB — CBC
HEMATOCRIT: 32 % — AB (ref 39.0–52.0)
HEMOGLOBIN: 9.7 g/dL — AB (ref 13.0–17.0)
MCH: 28.4 pg (ref 26.0–34.0)
MCHC: 30.3 g/dL (ref 30.0–36.0)
MCV: 93.8 fL (ref 78.0–100.0)
Platelets: 227 10*3/uL (ref 150–400)
RBC: 3.41 MIL/uL — ABNORMAL LOW (ref 4.22–5.81)
RDW: 15.3 % (ref 11.5–15.5)
WBC: 9.9 10*3/uL (ref 4.0–10.5)

## 2015-10-30 LAB — PROTIME-INR
INR: 3.31 — ABNORMAL HIGH (ref 0.00–1.49)
Prothrombin Time: 33 seconds — ABNORMAL HIGH (ref 11.6–15.2)

## 2015-10-30 MED ORDER — STARCH (THICKENING) PO POWD
ORAL | Status: DC | PRN
  Filled 2015-10-30: qty 227

## 2015-10-30 MED ORDER — AMIODARONE HCL 200 MG PO TABS
200.0000 mg | ORAL_TABLET | Freq: Every day | ORAL | Status: DC
  Administered 2015-10-31: 200 mg via ORAL
  Filled 2015-10-30: qty 1

## 2015-10-30 NOTE — Progress Notes (Signed)
Patient Name: [name] Date of Encounter: 10/30/2015   SUBJECTIVE  No chest pain or sOB.   CURRENT MEDS . amiodarone  400 mg Oral BID  . antiseptic oral rinse  7 mL Mouth Rinse BID  . aspirin EC  81 mg Oral Daily  . colchicine  0.6 mg Oral Daily  . feeding supplement (ENSURE ENLIVE)  237 mL Oral BID  . finasteride  5 mg Oral Daily  . metoprolol tartrate  12.5 mg Oral BID  . multivitamin with minerals  1 tablet Oral Daily  . potassium chloride  40 mEq Oral Daily  . pravastatin  40 mg Oral q1800  . sodium chloride flush  3 mL Intravenous Q12H  . tamsulosin  0.4 mg Oral Daily  . Warfarin - Pharmacist Dosing Inpatient   Does not apply q1800    OBJECTIVE  Filed Vitals:   10/29/15 0957 10/29/15 1230 10/29/15 2055 10/30/15 0624  BP: 96/68 99/60 100/64 108/58  Pulse:  74 76 61  Temp:  97.5 F (36.4 C) 97.8 F (36.6 C) 98.4 F (36.9 C)  TempSrc:  Oral Oral Oral  Resp:  18 19 18   Height:      Weight:    202 lb 14.4 oz (92.035 kg)  SpO2:  98% 100% 95%    Intake/Output Summary (Last 24 hours) at 10/30/15 1143 Last data filed at 10/30/15 0845  Gross per 24 hour  Intake   1200 ml  Output    602 ml  Net    598 ml   Filed Weights   10/28/15 0434 10/29/15 0530 10/30/15 0624  Weight: 204 lb (92.534 kg) 203 lb (92.08 kg) 202 lb 14.4 oz (92.035 kg)    PHYSICAL EXAM  General: Pleasant, NAD. Neuro: Alert and oriented X 3. Moves all extremities spontaneously. Psych: Normal affect. HEENT:  Normal  Neck: Supple without bruits. + JVD. Lungs:  Resp regular and unlabored. Faint rales Heart: RRR no s3, s4. Systolic murmurs. Abdomen: Soft, non-tender, non-distended, BS + x 4.  Extremities: No clubbing, cyanosis or edema. DP/PT/Radials 2+ and equal bilaterally.  Accessory Clinical Findings  CBC  Recent Labs  10/29/15 0310 10/30/15 0406  WBC 9.3 9.9  HGB 9.7* 9.7*  HCT 31.9* 32.0*  MCV 94.4 93.8  PLT 213 [id number]   Basic Metabolic Panel  Recent Labs   10/28/15 1103 10/30/15 0406  NA 139 139  K 4.5 3.9  CL 99* 101  CO2 29 29  GLUCOSE 147* 108*  BUN 34* 37*  CREATININE 1.98* 1.92*  CALCIUM 8.8* 8.5*    TELE  Sinus rhythm   Radiology/Studies  Dg Chest 2 View  10/25/2015  CLINICAL DATA:  Atelectasis EXAM: CHEST  2 VIEW COMPARISON:  10/19/2015 chest radiograph. FINDINGS: Stable cardiomediastinal silhouette with mild-to-moderate cardiomegaly. No pneumothorax. Stable small to moderate right pleural effusion. No left pleural effusion. No overt pulmonary edema. Stable patchy right lung base opacity. IMPRESSION: 1. Stable mild-to-moderate cardiomegaly without overt pulmonary edema. 2. Stable small to moderate right pleural effusion with nonspecific right lung base patchy opacity, favor atelectasis. Electronically Signed   By: Ilona Sorrel M.D.   On: 10/25/2015 10:32   Dg Chest 2 View  10/19/2015  CLINICAL DATA:  Shortness of breath. EXAM: CHEST  2 VIEW COMPARISON:  October 17, 2015. FINDINGS: Stable cardiomegaly. No pneumothorax is noted. Left lung is clear. Stable right basilar opacity is noted concerning for edema, pneumonia or atelectasis with moderate associated pleural effusion. Bony thorax is unremarkable. IMPRESSION:  Stable right basilar opacity concerning for pneumonia, atelectasis or edema with associated pleural effusion. Electronically Signed   By: Marijo Conception, M.D.   On: 10/19/2015 08:10   Dg Chest 2 View  10/12/2015  CLINICAL DATA:  82 year old male with recent cardiac surgery. Recent right chest tube for pneumothorax. Initial encounter. EXAM: CHEST  2 VIEW COMPARISON:  10/11/2015 and earlier. FINDINGS: Right chest tube removed. Trace if any right apical pneumothorax today, regressed since yesterday. Streaky peripheral and basilar opacity on the right has not significantly changed. Mildly improved lung volumes. Stable cardiac size and mediastinal contours. The patient arms are raised on the lateral view limiting its utility. Small  left pleural effusion. No acute pulmonary edema. No areas of worsening ventilation. Stable visualized osseous structures. Flowing osteophytes or syndesmophytes in the thoracic spine. IMPRESSION: 1. Right chest tube removed with trace if any residual right apical pneumothorax. 2. Small pleural effusions. Stable patchy peripheral and basilar opacity in the right lung. Electronically Signed   By: Genevie Ann M.D.   On: 10/12/2015 07:57   Dg Chest 2 View  10/11/2015  CLINICAL DATA:  Chest discomfort, chest tube in place on the right, recent mitral valve repair and subsequent wound exploration. EXAM: CHEST  2 VIEW COMPARISON:  Portable chest x-ray of October 09, 2015 FINDINGS: There is persistent mild volume loss on the right. There is a pneumothorax on the right amounting to approximately 10% or less of the lung volume. There is infiltrate in the right upper lobe which accentuates the pleural line. Patchy interstitial density in the right mid and lower lung is stable. The upper right-sided chest tube tip projects over the posterior aspect of the fifth rib. The lower chest tube tip is not well demonstrated but appears to lie in the inferior aspect of the right pleural space. There remains a small right pleural effusion. There is no mediastinal shift. On the left there is basilar atelectasis and possible trace pleural effusion. The mid and upper lung are clear. The cardiac silhouette remains enlarged and the central pulmonary vascularity remains engorged. IMPRESSION: 10% or less right apical pneumothorax. The right-sided chest tubes are in stable position. Persistent interstitial and alveolar opacities on the right. Minimal left basilar atelectasis. Stable cardiomegaly with mild central pulmonary vascular prominence. Critical Value/emergent results were called by telephone at the time of interpretation on 10/11/2015 at 7:54 am to Shirley Muscat, RN, , who verbally acknowledged these results. Electronically Signed   By: David   Martinique M.D.   On: 10/11/2015 07:50   Dg Chest 2 View  10/02/2015  CLINICAL DATA:  Preoperative assessment for cardiac surgery ; history hypertension, coronary artery disease, pulmonary hypertension, SVT, mitral regurgitation, chronic diastolic congestive heart failure EXAM: CHEST  2 VIEW COMPARISON:  07/05/2015 FINDINGS: Enlargement of cardiac silhouette. Mild atherosclerotic calcification aorta. Mediastinal contours and pulmonary vascularity normal. Lungs clear. No pleural effusion or pneumothorax. Diffuse osseous demineralization. IMPRESSION: Enlargement of cardiac silhouette. No acute abnormalities. Electronically Signed   By: Lavonia Dana M.D.   On: 10/02/2015 14:48   Ct Head Wo Contrast  10/18/2015  CLINICAL DATA:  Shortness of breath, altered mental status after heart surgery 2 weeks ago. History of diabetes, hypertension, prostate cancer. EXAM: CT HEAD WITHOUT CONTRAST TECHNIQUE: Contiguous axial images were obtained from the base of the skull through the vertex without intravenous contrast. COMPARISON:  CT head November 24, 2012 FINDINGS: The ventricles and sulci are normal for age. Symmetric basal ganglia mineralization. No intraparenchymal hemorrhage. Patchy supratentorial  white matter hypodensities are within normal range for patient's age and though non-specific suggest sequelae of chronic small vessel ischemic disease. No acute large vascular territory infarcts. RIGHT holo hemispheric cerebral spinal fluid equivalent subdural collection was 11 mm, now 14 mm. Mass effect on subjacent sulci without midline shift. Basal cisterns are patent. Minimal calcific atherosclerosis of the carotid siphons. No skull fracture. The included ocular globes and orbital contents are non-suspicious. Status post bilateral ocular lens implants. Bilateral maxillary sinus mucosal retention cysts without air-fluid levels. Imaged mastoid air cells are well aerated. Patient is edentulous. 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 Otherwise negative CT head for age. Electronically Signed   By: Elon Alas M.D.   On: 10/18/2015 01:47   US Abdomen Complete  10/18/2015  CLINICAL DATA:  Acute onset of elevated LFTs.  Initial encounter. EXAM: ABDOMEN ULTRASOUND COMPLETE COMPARISON:  CTA of the abdomen and pelvis from 08/02/2015 FINDINGS: Gallbladder: Stones and sludge are noted within the gallbladder. Gallbladder wall thickening is noted, measuring up to 5 mm. No ultrasonographic Murphy's sign is elicited. No pericholecystic fluid is seen. Common bile duct: Diameter: 0.5 cm, within normal limits in caliber. Liver: No focal lesion identified. Somewhat coarsened hepatic echotexture, raising concern for fatty infiltration. The hepatic contour is slightly nodular, raising question for mild hepatic cirrhosis. IVC: No abnormality visualized. Pancreas: Visualized portion unremarkable. Spleen: Size and appearance within normal limits. Right Kidney: Length: 11.0 cm. Echogenicity within normal limits. No mass or hydronephrosis visualized. A small amount of fluid is noted between the liver and right kidney. Left Kidney: Length: 10.7 cm. Echogenicity within normal limits. No mass or hydronephrosis visualized. Abdominal aorta: No aneurysm visualized. Not well characterized proximally or distally due to overlying structures. Other findings: None. IMPRESSION: 1. Stones and sludge within the gallbladder. Gallbladder wall thickening noted. This may reflect chronic inflammation, though would correlate with the patient's symptoms to exclude acute cholecystitis. No evidence of obstruction. 2. Slightly nodular hepatic contour raises question for mild hepatic cirrhosis. Underlying fatty infiltration noted. 3. Trace fluid between the liver and right kidney reflects minimal ascites. Electronically Signed   By: Garald Balding M.D.   On: 10/18/2015 00:58   Dg Chest Port 1 View  10/17/2015  CLINICAL DATA:   Shortness of breath, bilateral lower extremity edema for 2 weeks. Open heart surgery 2 weeks ago. History of hypertension, CHF, diabetes, coronary artery disease, dysrhythmia EXAM: PORTABLE CHEST 1 VIEW COMPARISON:  Chest x-rays dated 10/12/2015 and 10/09/2015. FINDINGS: Dense opacity at the right lung base is slightly more prominent on today's study, most likely a combination of atelectasis and small pleural effusion. Left lung remains clear. Cardiomegaly is stable. No pneumothorax seen. No acute osseous abnormality seen. Degenerative changes again noted about the right shoulder. IMPRESSION: Dense opacity at the right lung base appears slightly more prominent on today's study, most likely a combination of atelectasis and small pleural effusion, presumably with a slight interval increase compared to the previous exam. Stable cardiomegaly. Electronically Signed   By: Franki Cabot M.D.   On: 10/17/2015 19:18   Dg Chest Port 1 View  10/09/2015  CLINICAL DATA:  Status post mitral valve repair EXAM: PORTABLE CHEST 1 VIEW COMPARISON:  10/08/2015 FINDINGS: The cardiac shadow is enlarged but stable. Two right-sided chest tubes are again seen and stable no pneumothorax is noted. Right basilar atelectasis is noted slightly progressed when compare with the prior exam. No bony abnormality is noted. IMPRESSION: Increased right basilar  atelectatic changes. Thoracostomy catheters without pneumothorax. Although not mentioned in the body of the report, a left jugular sheath remains. Electronically Signed   By: Inez Catalina M.D.   On: 10/09/2015 07:16   Dg Chest Port 1 View  10/08/2015  CLINICAL DATA:  Post MVR 10/04/2015, history mitral valve prolapse, coronary artery disease, diabetes mellitus, hypertension, former smoker, pulmonary hypertension, chronic diastolic heart failure EXAM: PORTABLE CHEST 1 VIEW COMPARISON:  Portable exam 0512 hours compared to 10/07/2015 FINDINGS: RIGHT thoracostomy tubes unchanged. LEFT jugular  central venous catheter unchanged. Epicardial pacing leads noted. Enlargement of cardiac silhouette with pulmonary vascular congestion. Persistent atelectasis and minimal effusion at mid inferior RIGHT chest. Mild LEFT basilar atelectasis and small LEFT pleural effusion. No definite pneumothorax. IMPRESSION: RIGHT thoracostomy tubes with persistent basilar atelectasis and small effusion without pneumothorax per enlargement of cardiac silhouette with pulmonary vascular congestion. Persistent mile LEFT basilar atelectasis and small pleural effusion. Electronically Signed   By: Lavonia Dana M.D.   On: 10/08/2015 07:14   Dg Chest Port 1 View  10/07/2015  CLINICAL DATA:  Congestive heart failure. EXAM: PORTABLE CHEST 1 VIEW COMPARISON:  October 06, 2015 FINDINGS: The PA catheter has been removed with a left IJ sheath remaining. Other support apparatus is stable. No pneumothorax identified. Bilateral layering effusions with underlying atelectasis are stable. More diffuse opacity remains in the right mid and lower lung. Stable cardiomegaly. No other interval changes. IMPRESSION: Interval removal of the PA catheter. Small bilateral layering effusions with underlying atelectasis. Persistent opacity in the right mid and lower lung. Electronically Signed   By: Dorise Bullion III M.D   On: 10/07/2015 07:02   Dg Chest Port 1 View  10/06/2015  CLINICAL DATA:  Status postextubation.  Recent mitral valve repair EXAM: PORTABLE CHEST 1 VIEW COMPARISON:  October 05, 2015 FINDINGS: Endotracheal tube is in nasogastric tube have been removed. Swan-Ganz catheter tip is in the main pulmonary outflow tract. There is a chest tube on the right. Temporary pacemaker wires are attached to the right heart. No pneumothorax. There is persistent right effusion with right middle and lower lobe volume loss with patchy airspace opacity in these areas. There is also patchy airspace opacity in the left lower lobe. There is cardiomegaly. The pulmonary  vascularity shows evidence of mild pulmonary venous hypertension. No adenopathy evident. IMPRESSION: Tube and catheter positions as described without pneumothorax. Findings consistent with a degree of congestive heart failure. Atelectasis and possibly pneumonia in the right middle and both lower lobes cannot be excluded. The appearance is similar to 1 day prior. Electronically Signed   By: Lowella Grip III M.D.   On: 10/06/2015 07:55   Dg Chest Port 1 View  10/05/2015  CLINICAL DATA:  Status post mitral valve repair postop 1 and sternal re- incision. EXAM: PORTABLE CHEST 1 VIEW COMPARISON:  10/05/2015 and prior exam FINDINGS: An endotracheal tube with tip 4.2 cm above the carina, left IJ Swan-Ganz catheter with tip overlying the main pulmonary artery, and mediastinal/right thoracostomy tubes again noted. Improved right lung aeration noted with decreased edema/ airspace opacity. Bilateral lower lung opacities/ atelectasis/consolidation again noted. There is no evidence of pneumothorax. No other significant changes identified. IMPRESSION: Improved aeration bilaterally with decreased right lung edema/airspace opacity. Support apparatus as described.  No evidence of pneumothorax. Continued bilateral lower lung opacities. Electronically Signed   By: Margarette Canada M.D.   On: 10/05/2015 18:31   Dg Chest Port 1 View  10/05/2015  CLINICAL DATA:  Atelectasis. EXAM:  PORTABLE CHEST 1 VIEW COMPARISON:  10/04/2015. FINDINGS: Endotracheal tube 1.7 cm above the carina, slight proximal repositioning should be considered. Swan-Ganz catheter noted with tip in the pulmonary outflow tract. NG tube noted with tip below left hemidiaphragm. Right chest tubes and mediastinal drainage catheter in stable position. Stable cardiomegaly. Cardiac valve replacement. Progressive bibasilar atelectasis and/or infiltrates.No pneumothorax. Small bilateral pleural effusions. No acute bony abnormality . Cervical spine fusion. IMPRESSION: 1.  Endotracheal tube 1.7 cm above the carina, slight proximal repositioning should be considered. Remaining lines and tubes including right chest tubes in stable position. No pneumothorax 2. Low lung volumes with progressive bibasilar atelectasis and/or infiltrates/edema. Small bilateral pleural effusions. 3.  Persistent cardiomegaly.  Prior cardiac valve replacement. Electronically Signed   By: Marcello Moores  Register   On: 10/05/2015 08:06   Dg Chest Port 1 View  10/04/2015  CLINICAL DATA:  Minimally invasive mitral valve repair a EXAM: PORTABLE CHEST 1 VIEW COMPARISON:  10/02/2015 FINDINGS: Endotracheal tube 4 cm from carina. NG tube in stomach. Swan-Ganz catheter with tip in the main pulmonary artery. Two RIGHT chest tubes in place. Volume loss in the RIGHT hemi thorax. This atelectasis the RIGHT upper lobe RIGHT lower lobe with small effusion. No pneumothorax. LEFT lung clear. IMPRESSION: 1. Support apparatus appears in good position. 2. RIGHT lung atelectasis, effusion and 2 chest tubes in RIGHT hemi thorax. Electronically Signed   By: Suzy Bouchard M.D.   On: 10/04/2015 17:41    ASSESSMENT AND PLAN  1. Acute on chronic diastolic heart failure: Diuretics on hold due to elevated Scr which improved minimally to 1.92 from 1.98.  I&O negative 2.1L with 18lb weight loss. Uncertain about I/O accuracy and weight. BP soft low. Likely resume low dose diuretics.   2. Atrial fibrillation: Currently in sinus rhythm.  Amiodarone, warfarin. Continue metoprolol 12.5 mg bid.  3. S/p MV repair.  4. Dysphagia: Recommended Nectar-thick liquid  5. Chronic subdural hematoma on head CT on 3/15.  LOS: 13 days  Signed, Bhagat,Bhavinkumar PA-C Pager [phone]  Personally seen and examined. Agree with above.  Paroxysmal atrial fibrillation-he has been adequately loaded with amiodarone 400 mg twice a day since 10/13/15. We will go ahead and decrease his dosage to 200 mg once a day maintenance dose. After proximally 1  month if he remains in sinus rhythm, I would advocate discontinuation of amiodarone.  Question timing of discharge to rehabilitation facility? I would like to know what cardiothoracic surgery team thinks.  Candee Furbish, MD

## 2015-10-30 NOTE — Clinical Documentation Improvement (Signed)
Cardiology and/or CT Surgery  (Please document query responses in the current medical record, not on the [facility] query form.  Thank you.)  Possible Clinical Conditions:  - Acute Kidney Injury, including and associated conditions and/or causes  - Other condition  - Unable to clinically determine  Clinical Information/Indicators: Bun/Cr/GFR trend for the past 5 days.  SEE BELOW     (white male) Creatinine increased 0.76 mg/dL in 48 hours from 10/26/15 to 10/28/15 Receiving IV and PO diuretics this admission "Creatinine up to 1.98. Diuretics have been stopped for now" documented in CTS progress note 10/28/15  "Diuretics on hold due to elevated Scr which improved minimally to 1.92 from 1.98. I&O negative 2.1L with 18lb weight loss. Uncertain about I/O accuracy and weight. BP soft low. Likely resume low dose diuretics" documented in Cards progress note 10/30/15 "Creatinine slightly decreased to 1.92. Diuretics have been stopped." documented in CTS progress note 10/30/15 Component     Latest Ref Rng 10/26/2015 10/27/2015 10/28/2015 10/30/2015  BUN     6 - 20 mg/dL 23 (H) 25 (H) 34 (H) 37 (H)  Creatinine     0.61 - 1.24 mg/dL 1.22 1.45 (H) 1.98 (H) 1.92 (H)  EGFR (Non-African Amer.)     >60 mL/min 53 (L) 43 (L) 29 (L) 30 (L)   Please exercise your independent, professional judgment when responding. A specific answer is not anticipated or expected.   Thank You, Erling Conte  RN BSN CCDS [phone] Health Information Management [place]

## 2015-10-30 NOTE — Progress Notes (Signed)
Physical Therapy Treatment Patient Details Name: [name] MRN: [id number] DOB: 03/19/1929 Today's Date: 10/30/2015    History of Present Illness [name] is an 82 yo white male with chronic diastolic CHF, coronary artery disease , recurrent SVT s/p DCCV, pulmonary hypertension, type 2 diabetes mellitus, hyperlipidemia, and chronic back pain who recently underwent minimally invasive Mitral Valve Repair 10/04/15 presented with SOB and LE edema 10/17/15.     PT Comments    Patient willing to ambulate in room but only 20 feet before returning to bed. Pt used a rw and min guard assistance for improved stability. The patient denied any pain or shortness of breath during session. PT to continue to follow and progress mobility as tolerated. Reminded pt to only be up with assistance and he verbalized understanding.   Follow Up Recommendations  SNF     Equipment Recommendations  None recommended by PT    Recommendations for Other Services       Precautions / Restrictions Precautions Precautions: Fall;Sternal Restrictions Weight Bearing Restrictions: No    Mobility  Bed Mobility Overal bed mobility: Needs Assistance Bed Mobility: Rolling;Sidelying to Sit Rolling: Supervision Sidelying to sit: Supervision;HOB elevated       General bed mobility comments: cues for sternal precautions needed.   Transfers Overall transfer level: Needs assistance Equipment used: Rolling walker (2 wheeled) Transfers: Sit to/from Stand Sit to Stand: Min guard         General transfer comment: cues for hand position, no loss of balance.   Ambulation/Gait Ambulation/Gait assistance: Min guard Ambulation Distance (Feet): 20 Feet Assistive device: Rolling walker (2 wheeled) Gait Pattern/deviations: Step-through pattern;Decreased step length - right;Decreased step length - left Gait velocity: decreased   General Gait Details: Pt ambulating well, refusing to ambulate any further distance at  this time despite encouragement.    Stairs            Wheelchair Mobility    Modified Rankin (Stroke Patients Only)       Balance Overall balance assessment: Needs assistance Sitting-balance support: No upper extremity supported Sitting balance-Leahy Scale: Good     Standing balance support: Bilateral upper extremity supported Standing balance-Leahy Scale: Poor Standing balance comment: using rw for support                    Cognition Arousal/Alertness: Awake/alert Behavior During Therapy: WFL for tasks assessed/performed Overall Cognitive Status: Within Functional Limits for tasks assessed                      Exercises      General Comments        Pertinent Vitals/Pain Pain Assessment: No/denies pain    Home Living                      Prior Function            PT Goals (current goals can now be found in the care plan section) Acute Rehab PT Goals Patient Stated Goal: get out of the hospital PT Goal Formulation: With patient Time For Goal Achievement: 11/02/15 Potential to Achieve Goals: Fair Progress towards PT goals: Progressing toward goals    Frequency  Min 2X/week    PT Plan Current plan remains appropriate    Co-evaluation             End of Session Equipment Utilized During Treatment: Gait belt Activity Tolerance: Patient limited by fatigue;Other (comment) (declining any further activity)  Patient left: in bed;with call bell/phone within reach     Time: 1357-1412 PT Time Calculation (min) (ACUTE ONLY): 15 min  Charges:  $Gait Training: 8-22 mins                    G Codes:      Cassell Clement, PT, CSCS Pager [phone] Office [phone]  10/30/2015, 2:54 PM

## 2015-10-30 NOTE — Care Management Important Message (Signed)
Important Message  Patient Details  Name: [name] MRN: [id number] Date of Birth: 1928-08-14   Medicare Important Message Given:  Yes    [name] 10/30/2015, 12:53 PM

## 2015-10-30 NOTE — Care Management Note (Signed)
Case Management Note  Patient Details  Name: [name] MRN: [id number] Date of Birth: 1928-11-09  Subjective/Objective:          CHF, S/p MV repair          Action/Plan: Discharge Planning:  35 NCM spoke to pt's wife, [name] [phone], [phone]. Pt scheduled for dc to SNF at [facility]. NCM spoke to CSW for SNF to make aware scheduled dc 10/31/2015.    Expected Discharge Date:  10/31/2015               Expected Discharge Plan:  Skilled Nursing Facility  In-House Referral:  Clinical Social Work  Discharge planning Services  CM Consult  Post Acute Care Choice:  NA Choice offered to:  NA  DME Arranged:  N/A DME Agency:  NA  HH Arranged:  NA HH Agency:  NA  Status of Service:  Completed, signed off  Medicare Important Message Given:  Yes Date Medicare IM Given:    Medicare IM give by:    Date Additional Medicare IM Given:    Additional Medicare Important Message give by:     If discussed at [place] of Stay Meetings, dates discussed:    Additional Comments:  Erenest Rasher, RN 10/30/2015, 6:57 PM

## 2015-10-30 NOTE — Progress Notes (Signed)
ANTICOAGULATION CONSULT NOTE   Pharmacy Consult for Warfarin Indication: atrial fibrillation  No Known Allergies  Labs:  Recent Labs  10/28/15 0258 10/28/15 1103 10/29/15 0310 10/30/15 0406  HGB 10.0*  --  9.7* 9.7*  HCT 33.0*  --  31.9* 32.0*  PLT 245  --  213 227  LABPROT 25.1*  --  28.7* 33.0*  INR 2.30*  --  2.75* 3.31*  CREATININE  --  1.98*  --  1.92*    Estimated Creatinine Clearance: 32.7 mL/min (by C-G formula based on Cr of 1.92).  Assessment: 82 yo with Afib on Warfarin PTA- Warfarin Dose: 2 mg daily PTA. Admitted with supra-therapeutic INR and received vitamin K 3/15.  Restarted Warfarin 3/18 with INR goal of 2-2.5 per cards.  INR today at 3.31 despite previously consistent therapeutic INRs. Hgb stable, plt ok. No reported bleeding.   DDI: Amiodarone  Chronic subdural hematoma on head CT on 3/15.  Per Cards MD continue to follow subdural hematoma closely with slow titration of warfarin.  Goal of Therapy:  INR 2-2.5 Monitor platelets by anticoagulation protocol: Yes   Plan:   -Hold warfarin tonight - expect he will need some combination of 2mg  and 1mg  doses upon discharge -Daily INR and CBC -Monitor for s/sx of bleeding  Thank you Anette Guarneri, PharmD [phone]  10/30/2015 9:58 AM

## 2015-10-30 NOTE — Progress Notes (Addendum)
      [address]       [place] [address]             [phone]        10 Days Post-Op Procedure(s) (LRB): CARDIOVERSION (N/A)  Subjective: Patient states he hopes to get out of here soon.  Objective: Vital signs in last 24 hours: Temp:  [97.5 F (36.4 C)-98.4 F (36.9 C)] 97.9 F (36.6 C) (03/27 1210) Pulse Rate:  [61-76] 62 (03/27 1210) Cardiac Rhythm:  [-] Normal sinus rhythm (03/26 1900) Resp:  [16-19] 16 (03/27 1210) BP: (99-110)/(58-67) 110/67 mmHg (03/27 1210) SpO2:  [95 %-100 %] 96 % (03/27 1210) Weight:  [202 lb 14.4 oz (92.035 kg)] 202 lb 14.4 oz (92.035 kg) (03/27 0624)  Current Weight  10/30/15 202 lb 14.4 oz (92.035 kg)       Intake/Output from previous day: 03/26 0701 - 03/27 0700 In: 1080 [P.O.:1080] Out: 601 [Urine:600; Stool:1]   Physical Exam:  Cardiovascular: RRR, murmur Pulmonary: Slightly diminished at bases Abdomen: Soft, non tender, bowel sounds present. Extremities: No lower extremity edema, but decreased a lot over last week. Right ankle much less tender Wounds: Sternal incision is clean and dry.  No erythema or signs of infection. Right groin wound superficially dehisced with superficial slough. Very moist in left groin this am.  Lab Results: CBC:  Recent Labs  10/29/15 0310 10/30/15 0406  WBC 9.3 9.9  HGB 9.7* 9.7*  HCT 31.9* 32.0*  PLT 213 227   BMET:   Recent Labs  10/28/15 1103 10/30/15 0406  NA 139 139  K 4.5 3.9  CL 99* 101  CO2 29 29  GLUCOSE 147* 108*  BUN 34* 37*  CREATININE 1.98* 1.92*  CALCIUM 8.8* 8.5*    PT/INR:  Lab Results  Component Value Date   INR 3.31* 10/30/2015   INR 2.75* 10/29/2015   INR 2.30* 10/28/2015   ABG:  INR: Will add last result for INR, ABG once components are confirmed Will add last 4 CBG results once components are confirmed  Assessment/Plan:  1. CV - S/p mini MV repair on 10/04/2015. Previous A fib. On Amiodarone 400 mg daily and Lopressor 12.5  mg bid, and Coumadin 2 mg daily.  INR  Now up to 3.31 per pharmacy. 2. Volume Overload - On 80 mg IV Lasix BID and Zaroxolyn 2.5 mg daily. Creatinine slightly decreased to 1.92. Diuretics have been stopped. 3.  Normocytic anemia - H and H stable at 9.7 and 32 4. Right groin wound is superficially dehisced and yellow slough. There is no dry 4 x 4 gauze in there. Wound care instructions now being followed.  5. Speech path to did swallow study yesterday. On Dysphagia 3 diet.  [name],DONIELLE MPA-C 10/30/2015,12:14 PM   I have seen and examined the patient and agree with the assessment and plan as outlined.  He appears to be stable and ready for d/c to SNF.  However, diuretics have been stopped at this point - he will need to be seen in [facility] clinic very soon after d/c to reassess his volume status and decrease the likelihood that he develops another acute exacerbation of chronic diastolic CHF.  Managing his volume status may be tricky for the remainder of his life.  We will make arrangements to follow his groin incision closely.  Rexene Alberts, MD 10/30/2015

## 2015-10-30 NOTE — Procedures (Addendum)
Objective Swallowing Evaluation: Type of Study: MBS-Modified Barium Swallow Study  Patient Details  Name: [name] MRN: [id number] Date of Birth: 12-Jun-1929  Today's Date: 10/30/2015 Time: SLP Start Time (ACUTE ONLY): 1103-SLP Stop Time (ACUTE ONLY): 1120 SLP Time Calculation (min) (ACUTE ONLY): 17 min  Past Medical History:  Past Medical History  Diagnosis Date  . Coronary artery disease     a. 11/2014 NSTEMI: DESx 2 placed to LAD and RCA   . Mitral valve prolapse   . Diabetes mellitus, type II ([place])     a. HgA1c 6.8 in 03/2015  . Mitral regurgitation     a. severe wtih flail leaflet by 2D ECHO (06/05/15)  . SVT (supraventricular tachycardia) ([facility])     a. recurrent, usually responsive to vagal manuevers  . HTN (hypertension)   . Pulmonary HTN ([place])   . Lower extremity edema     a. chronic  . HLD (hyperlipidemia)   . Hypertension   . OA (osteoarthritis)   . ED (erectile dysfunction)   . Chronic fatigue   . Gout   . Spinal stenosis   . Prostate cancer ([place])   . Severe mitral regurgitation 11/17/2014    Mitral valve prolapse with flail segment of anterior leaflet   . Chronic diastolic (congestive) heart failure ([place])   . Hemangioma of liver 08/02/2015    12 mm enhancing lesion seen on CT - suspicious for benign hemangioma but not diagnostic - MRI recommended  . Dysrhythmia   . Heart murmur   . Shortness of breath dyspnea   . Kidney stones   . Physical deconditioning   . S/P minimally invasive mitral valve repair 10/04/2015    Complex valvuloplasty including artificial Gore-tex neochord placement x6 with plication closure of cleft between P2 and P3 and 30 mm Sorin Memo 3D ring annuloplasty via right mini thoracotomy approach   . Hypertensive left ventricular hypertrophy with heart failure ([place]) 10/04/2015  . Hypertensive cardiomyopathy [facility])    Past Surgical History:  Past Surgical History  Procedure Laterality Date  . Cardiac surgery      2 cardiac stents  . Joint  replacement      shoulder  . Back surgery x 2    . Rt rotator cuff repair    . Cervical spine surgery    . Back surgery    . Left heart catheterization with coronary angiogram N/A 11/16/2014    Procedure: LEFT HEART CATHETERIZATION WITH CORONARY ANGIOGRAM;  Surgeon: Troy Sine, MD;  Location: [facility] CATH LAB;  Service: Cardiovascular;  Laterality: N/A;  . Tee without cardioversion N/A 06/22/2015    Procedure: TRANSESOPHAGEAL ECHOCARDIOGRAM (TEE);  Surgeon: Skeet Latch, MD;  Location: [place];  Service: Cardiovascular;  Laterality: N/A;  . Cardiac catheterization N/A 08/08/2015    Procedure: Right/Left Heart Cath and Coronary Angiography;  Surgeon: Sherren Mocha, MD;  Location: [place] CV LAB;  Service: Cardiovascular;  Laterality: N/A;  . Eye surgery    . Mitral valve repair Right 10/04/2015    Procedure: MINIMALLY INVASIVE MITRAL VALVE REPAIR (MVR);  Surgeon: Rexene Alberts, MD;  Location: [place];  Service: Open Heart Surgery;  Laterality: Right;  . Tee without cardioversion N/A 10/04/2015    Procedure: TRANSESOPHAGEAL ECHOCARDIOGRAM (TEE);  Surgeon: Rexene Alberts, MD;  Location: [place];  Service: Open Heart Surgery;  Laterality: N/A;  . Wound exploration N/A 10/05/2015    Procedure: Sternal Incision;  Surgeon: Rexene Alberts, MD;  Location: [place];  Service: Open  Heart Surgery;  Laterality: N/A;  . Intraoperative transesophageal echocardiogram  10/05/2015    Procedure: INTRAOPERATIVE TRANSESOPHAGEAL ECHOCARDIOGRAM;  Surgeon: Rexene Alberts, MD;  Location: [place];  Service: Open Heart Surgery;;  . Cardioversion N/A 10/20/2015    Procedure: CARDIOVERSION;  Surgeon: Pixie Casino, MD;  Location: [facility] ENDOSCOPY;  Service: Cardiovascular;  Laterality: N/A;   HPI: [name] is an 82 yo white male with chronic diastolic CHF, ACDF [id number] (pt unable to recall date), cervical spinal stenosis, coronary artery disease , recurrent SVT, pulmonary hypertension, type 2 diabetes mellitus,  hyperlipidemia, and chronic back pain who recently underwent minimally invasive Mitral Valve Repair 10/04/15 presented with SOB and LE edema 10/17/15, also increased INR and inability to ambulate at home.CXR 3/22 stable small to moderate right pleural effusion with nonspecific right lung base patchy opacity, favor atelectasis. RN initiated order after observing difficulty with pills.   No Data Recorded  Assessment / Plan / Recommendation  MBS complete. Pt exhibited mild-mod pharyngeal phase dysphagia characterized primarily by anatomical etiology with obversation of bony growth vertically along cervical vertebrae (known history of cervical spine stenosis) and sensory deficits. Diameter of pharynx was significantly reduced preventing full epigottic inversion and resulting in silent penetration during the swallow above the level of the vocal cords and before the swallow to the valleculae due to delayed swallow initiation. Pt instructed to utilize chin tuck with thin liquids to reduce airway compromise, however unsuccessful in prevention of penetration due to minimal ROM head flexion. Reduced epiglottic inversion and decreased base of tongue retraction resulted in vallecular residue, which then led to silent penetration after the swallow. Due to anatomical presentation during this study, suspect chronic dysphagia however pt likely able to compensate for impairments at home (no prior pna). Recommend Dysphagia 3 (mehcanical soft) and nectar thick liquids during admission due to deconditioning with probable upgrade once strength and endurance improves. Pt educated re: results of MBS, compensatory strategies (swallow x2 after every bite/sip, small bites/sips, follow solids with liquids, clear throat intermittently) and diet recommendation.    CHL IP TREATMENT RECOMMENDATION 10/30/2015  Treatment Recommendations Therapy as outlined in treatment plan below     Prognosis 10/30/2015  Prognosis for Safe Diet Advancement  Fair  Barriers to Reach Goals Severity of deficits  Barriers/Prognosis Comment --    CHL IP DIET RECOMMENDATION 10/30/2015  SLP Diet Recommendations Dysphagia 3 (Mech soft) solids;Nectar thick liquid  Liquid Administration via Cup;No straw  Medication Administration Crushed with puree  Compensations Minimize environmental distractions;Slow rate;Small sips/bites;Multiple dry swallows after each bite/sip;Clear throat intermittently;Follow solids with liquid  Postural Changes Seated upright at 90 degrees      CHL IP OTHER RECOMMENDATIONS 10/30/2015  Recommended Consults --  Oral Care Recommendations Oral care BID  Other Recommendations Order thickener from pharmacy;Prohibited food (jello, ice cream, thin soups);Remove water pitcher      CHL IP FOLLOW UP RECOMMENDATIONS 10/30/2015  Follow up Recommendations Home health SLP      CHL IP FREQUENCY AND DURATION 10/30/2015  Speech Therapy Frequency (ACUTE ONLY) min 2x/week  Treatment Duration 2 weeks           CHL IP ORAL PHASE 10/30/2015  Oral Phase WFL  Oral - Pudding Teaspoon --  Oral - Pudding Cup --  Oral - Honey Teaspoon --  Oral - Honey Cup --  Oral - Nectar Teaspoon --  Oral - Nectar Cup --  Oral - Nectar Straw --  Oral - Thin Teaspoon --  Oral - Thin Cup --  Oral - Thin Straw --  Oral - Puree --  Oral - Mech Soft --  Oral - Regular --  Oral - Multi-Consistency --  Oral - Pill --  Oral Phase - Comment --    CHL IP PHARYNGEAL PHASE 10/30/2015  Pharyngeal Phase Impaired  Pharyngeal- Pudding Teaspoon --  Pharyngeal --  Pharyngeal- Pudding Cup --  Pharyngeal --  Pharyngeal- Honey Teaspoon --  Pharyngeal --  Pharyngeal- Honey Cup --  Pharyngeal --  Pharyngeal- Nectar Teaspoon --  Pharyngeal --  Pharyngeal- Nectar Cup Delayed swallow initiation-vallecula;Pharyngeal residue - valleculae;Reduced tongue base retraction;Penetration/Aspiration during swallow;Reduced epiglottic inversion;Reduced airway/laryngeal  closure;Penetration/Aspiration before swallow  Pharyngeal Material enters airway, remains ABOVE vocal cords and not ejected out  Pharyngeal- Nectar Straw --  Pharyngeal --  Pharyngeal- Thin Teaspoon Penetration/Aspiration during swallow;Penetration/Apiration after swallow;Delayed swallow initiation-vallecula;Reduced epiglottic inversion;Reduced airway/laryngeal closure;Reduced tongue base retraction;Pharyngeal residue - valleculae;Compensatory strategies attempted (with notebox)  Pharyngeal Material enters airway, remains ABOVE vocal cords and not ejected out  Pharyngeal- Thin Cup --  Pharyngeal --  Pharyngeal- Thin Straw --  Pharyngeal --  Pharyngeal- Puree --  Pharyngeal --  Pharyngeal- Mechanical Soft --  Pharyngeal --  Pharyngeal- Regular Delayed swallow initiation-vallecula;Pharyngeal residue - valleculae  Pharyngeal --  Pharyngeal- Multi-consistency --  Pharyngeal --  Pharyngeal- Pill --  Pharyngeal --  Pharyngeal Comment --     CHL IP CERVICAL ESOPHAGEAL PHASE 10/30/2015  Cervical Esophageal Phase Impaired  Pudding Teaspoon --  Pudding Cup --  Honey Teaspoon --  Honey Cup --  Nectar Teaspoon --  Nectar Cup --  Nectar Straw --  Thin Teaspoon WFL  Thin Cup --  Thin Straw --  Puree --  Mechanical Soft --  Regular --  Multi-consistency --  Pill --  Cervical Esophageal Comment --    No flowsheet data found.  Houston Siren 10/30/2015, 1:41 PM   Orbie Pyo Colvin Caroli.Ed [employer] [phone]

## 2015-10-30 NOTE — Evaluation (Addendum)
Clinical/Bedside Swallow Evaluation Patient Details  Name: [name] MRN: [id number] Date of Birth: 09/25/1928  Today's Date: 10/30/2015 Time:        Past Medical History:  Past Medical History  Diagnosis Date  . Coronary artery disease     a. 11/2014 NSTEMI: DESx 2 placed to LAD and RCA   . Mitral valve prolapse   . Diabetes mellitus, type II ([place])     a. HgA1c 6.8 in 03/2015  . Mitral regurgitation     a. severe wtih flail leaflet by 2D ECHO (06/05/15)  . SVT (supraventricular tachycardia) ([facility])     a. recurrent, usually responsive to vagal manuevers  . HTN (hypertension)   . Pulmonary HTN ([place])   . Lower extremity edema     a. chronic  . HLD (hyperlipidemia)   . Hypertension   . OA (osteoarthritis)   . ED (erectile dysfunction)   . Chronic fatigue   . Gout   . Spinal stenosis   . Prostate cancer ([place])   . Severe mitral regurgitation 11/17/2014    Mitral valve prolapse with flail segment of anterior leaflet   . Chronic diastolic (congestive) heart failure ([place])   . Hemangioma of liver 08/02/2015    12 mm enhancing lesion seen on CT - suspicious for benign hemangioma but not diagnostic - MRI recommended  . Dysrhythmia   . Heart murmur   . Shortness of breath dyspnea   . Kidney stones   . Physical deconditioning   . S/P minimally invasive mitral valve repair 10/04/2015    Complex valvuloplasty including artificial Gore-tex neochord placement x6 with plication closure of cleft between P2 and P3 and 30 mm Sorin Memo 3D ring annuloplasty via right mini thoracotomy approach   . Hypertensive left ventricular hypertrophy with heart failure ([place]) 10/04/2015  . Hypertensive cardiomyopathy [facility])    Past Surgical History:  Past Surgical History  Procedure Laterality Date  . Cardiac surgery      2 cardiac stents  . Joint replacement      shoulder  . Back surgery x 2    . Rt rotator cuff repair    . Cervical spine surgery    . Back surgery    . Left heart catheterization  with coronary angiogram N/A 11/16/2014    Procedure: LEFT HEART CATHETERIZATION WITH CORONARY ANGIOGRAM;  Surgeon: Troy Sine, MD;  Location: [facility] CATH LAB;  Service: Cardiovascular;  Laterality: N/A;  . Tee without cardioversion N/A 06/22/2015    Procedure: TRANSESOPHAGEAL ECHOCARDIOGRAM (TEE);  Surgeon: Skeet Latch, MD;  Location: [place];  Service: Cardiovascular;  Laterality: N/A;  . Cardiac catheterization N/A 08/08/2015    Procedure: Right/Left Heart Cath and Coronary Angiography;  Surgeon: Sherren Mocha, MD;  Location: [place] CV LAB;  Service: Cardiovascular;  Laterality: N/A;  . Eye surgery    . Mitral valve repair Right 10/04/2015    Procedure: MINIMALLY INVASIVE MITRAL VALVE REPAIR (MVR);  Surgeon: Rexene Alberts, MD;  Location: [place];  Service: Open Heart Surgery;  Laterality: Right;  . Tee without cardioversion N/A 10/04/2015    Procedure: TRANSESOPHAGEAL ECHOCARDIOGRAM (TEE);  Surgeon: Rexene Alberts, MD;  Location: [place];  Service: Open Heart Surgery;  Laterality: N/A;  . Wound exploration N/A 10/05/2015    Procedure: Sternal Incision;  Surgeon: Rexene Alberts, MD;  Location: [place];  Service: Open Heart Surgery;  Laterality: N/A;  . Intraoperative transesophageal echocardiogram  10/05/2015    Procedure: INTRAOPERATIVE TRANSESOPHAGEAL ECHOCARDIOGRAM;  Surgeon:  Rexene Alberts, MD;  Location: [place];  Service: Open Heart Surgery;;  . Cardioversion N/A 10/20/2015    Procedure: CARDIOVERSION;  Surgeon: Pixie Casino, MD;  Location: [facility] ENDOSCOPY;  Service: Cardiovascular;  Laterality: N/A;   HPI:  [name] is an 82 yo white male with chronic diastolic CHF, ACDF [id number] (pt unable to recall date), cervical spinal stenosis, coronary artery disease , recurrent SVT, pulmonary hypertension, type 2 diabetes mellitus, hyperlipidemia, and chronic back pain who recently underwent minimally invasive Mitral Valve Repair 10/04/15 presented with SOB and LE edema 10/17/15, also increased INR and  inability to ambulate at home.CXR 3/22 stable small to moderate right pleural effusion with nonspecific right lung base patchy opacity, favor atelectasis. RN initiated order after observing difficulty with pills.    Assessment / Plan / Recommendation Clinical Impression  Differential diagnosis includes cervical esophageal dysfunction versus anatomical impairments given history of cervical spinal stenosis. Suspect decreased airway protection and/or decreased cervical esophageal clearance to facilitate laryngeal closure versus cervical esophageal transit evidenced by prolonged laryngeal elevation. Throat clears and coughs with thin liquids. Will place pt on more conservative texture of Dys 3 and nectar thick liquids with MBS next date (nectar may be excessvie viscocity if cervical esophageal involvement.      Aspiration Risk   (mild-mod)    Diet Recommendation Dysphagia 3 (Mech soft);Nectar-thick liquid   Medication Administration: Crushed with puree Supervision: Staff to assist with self feeding;Full supervision/cueing for compensatory strategies Compensations: Slow rate;Small sips/bites;Follow solids with liquid    Other  Recommendations Oral Care Recommendations: Oral care BID   Follow up Recommendations   (TBD)    Frequency and Duration            Prognosis        Swallow Study   General HPI: [name] is an 82 yo white male with chronic diastolic CHF, ACDF [id number] (pt unable to recall date), cervical spinal stenosis, coronary artery disease , recurrent SVT, pulmonary hypertension, type 2 diabetes mellitus, hyperlipidemia, and chronic back pain who recently underwent minimally invasive Mitral Valve Repair 10/04/15 presented with SOB and LE edema 10/17/15, also increased INR and inability to ambulate at home.CXR 3/22 stable small to moderate right pleural effusion with nonspecific right lung base patchy opacity, favor atelectasis. RN initiated order after observing difficulty with pills.   Previous Swallow Assessment:  (none found) Respiratory Status: Room air History of Recent Intubation: No Behavior/Cognition: Cooperative;Alert;Pleasant mood;Requires cueing Oral Care Completed by SLP: No Oral Cavity - Dentition: Dentures, bottom;Dentures, top Vision: Functional for self-feeding Baseline Vocal Quality: Low vocal intensity Volitional Cough: Strong Volitional Swallow: Able to elicit    Oral/Motor/Sensory Function Overall Oral Motor/Sensory Function: Within functional limits   Ice Chips Ice chips: Not tested   Thin Liquid Thin Liquid: Impaired Presentation: Cup;Straw Oral Phase Impairments:  (WFL) Oral Phase Functional Implications:  (WFL) Pharyngeal  Phase Impairments: Throat Clearing - Delayed;Cough - Immediate (audible swallow, prolonged laryngea elevation w/ throat clea)    Nectar Thick Nectar Thick Liquid: Not tested   Honey Thick Honey Thick Liquid: Not tested   Puree Puree: Within functional limits   Solid   GO   Solid: Within functional limits Oral Phase Impairments:  (none) Oral Phase Functional Implications:  (none)        Emmitte Surgeon, Orbie Pyo 10/30/2015,11:02 AM  Orbie Pyo Colvin Caroli.[employer] [phone]

## 2015-10-31 ENCOUNTER — Telehealth: Payer: Self-pay | Admitting: Physician Assistant

## 2015-10-31 DIAGNOSIS — I5033 Acute on chronic diastolic (congestive) heart failure: Secondary | ICD-10-CM | POA: Diagnosis not present

## 2015-10-31 DIAGNOSIS — D649 Anemia, unspecified: Secondary | ICD-10-CM | POA: Diagnosis not present

## 2015-10-31 DIAGNOSIS — I10 Essential (primary) hypertension: Secondary | ICD-10-CM | POA: Diagnosis not present

## 2015-10-31 DIAGNOSIS — E785 Hyperlipidemia, unspecified: Secondary | ICD-10-CM | POA: Diagnosis not present

## 2015-10-31 DIAGNOSIS — M1A30X0 Chronic gout due to renal impairment, unspecified site, without tophus (tophi): Secondary | ICD-10-CM | POA: Diagnosis not present

## 2015-10-31 DIAGNOSIS — I6203 Nontraumatic chronic subdural hemorrhage: Secondary | ICD-10-CM | POA: Diagnosis not present

## 2015-10-31 DIAGNOSIS — N390 Urinary tract infection, site not specified: Secondary | ICD-10-CM | POA: Diagnosis not present

## 2015-10-31 DIAGNOSIS — R2681 Unsteadiness on feet: Secondary | ICD-10-CM | POA: Diagnosis not present

## 2015-10-31 DIAGNOSIS — I480 Paroxysmal atrial fibrillation: Secondary | ICD-10-CM | POA: Diagnosis not present

## 2015-10-31 DIAGNOSIS — R296 Repeated falls: Secondary | ICD-10-CM | POA: Diagnosis not present

## 2015-10-31 DIAGNOSIS — I2510 Atherosclerotic heart disease of native coronary artery without angina pectoris: Secondary | ICD-10-CM | POA: Diagnosis not present

## 2015-10-31 DIAGNOSIS — I272 Other secondary pulmonary hypertension: Secondary | ICD-10-CM | POA: Diagnosis not present

## 2015-10-31 DIAGNOSIS — E784 Other hyperlipidemia: Secondary | ICD-10-CM | POA: Diagnosis not present

## 2015-10-31 DIAGNOSIS — R1312 Dysphagia, oropharyngeal phase: Secondary | ICD-10-CM | POA: Diagnosis not present

## 2015-10-31 DIAGNOSIS — R4182 Altered mental status, unspecified: Secondary | ICD-10-CM | POA: Diagnosis not present

## 2015-10-31 DIAGNOSIS — Z9889 Other specified postprocedural states: Secondary | ICD-10-CM | POA: Diagnosis not present

## 2015-10-31 DIAGNOSIS — I509 Heart failure, unspecified: Secondary | ICD-10-CM | POA: Diagnosis not present

## 2015-10-31 DIAGNOSIS — I481 Persistent atrial fibrillation: Secondary | ICD-10-CM | POA: Diagnosis not present

## 2015-10-31 DIAGNOSIS — E876 Hypokalemia: Secondary | ICD-10-CM | POA: Diagnosis not present

## 2015-10-31 DIAGNOSIS — E119 Type 2 diabetes mellitus without complications: Secondary | ICD-10-CM | POA: Diagnosis not present

## 2015-10-31 DIAGNOSIS — R41841 Cognitive communication deficit: Secondary | ICD-10-CM | POA: Diagnosis not present

## 2015-10-31 DIAGNOSIS — I482 Chronic atrial fibrillation: Secondary | ICD-10-CM | POA: Diagnosis not present

## 2015-10-31 DIAGNOSIS — I5030 Unspecified diastolic (congestive) heart failure: Secondary | ICD-10-CM | POA: Diagnosis not present

## 2015-10-31 DIAGNOSIS — Z79899 Other long term (current) drug therapy: Secondary | ICD-10-CM | POA: Diagnosis not present

## 2015-10-31 DIAGNOSIS — M6281 Muscle weakness (generalized): Secondary | ICD-10-CM | POA: Diagnosis not present

## 2015-10-31 DIAGNOSIS — I5032 Chronic diastolic (congestive) heart failure: Secondary | ICD-10-CM | POA: Diagnosis not present

## 2015-10-31 DIAGNOSIS — N178 Other acute kidney failure: Secondary | ICD-10-CM | POA: Diagnosis not present

## 2015-10-31 DIAGNOSIS — N401 Enlarged prostate with lower urinary tract symptoms: Secondary | ICD-10-CM | POA: Diagnosis not present

## 2015-10-31 DIAGNOSIS — Z741 Need for assistance with personal care: Secondary | ICD-10-CM | POA: Diagnosis not present

## 2015-10-31 DIAGNOSIS — L89312 Pressure ulcer of right buttock, stage 2: Secondary | ICD-10-CM | POA: Diagnosis not present

## 2015-10-31 LAB — CBC
HCT: 32.6 % — ABNORMAL LOW (ref 39.0–52.0)
HEMOGLOBIN: 9.7 g/dL — AB (ref 13.0–17.0)
MCH: 28.3 pg (ref 26.0–34.0)
MCHC: 29.8 g/dL — ABNORMAL LOW (ref 30.0–36.0)
MCV: 95 fL (ref 78.0–100.0)
PLATELETS: 201 10*3/uL (ref 150–400)
RBC: 3.43 MIL/uL — AB (ref 4.22–5.81)
RDW: 15.4 % (ref 11.5–15.5)
WBC: 7.4 10*3/uL (ref 4.0–10.5)

## 2015-10-31 LAB — GLUCOSE, CAPILLARY
GLUCOSE-CAPILLARY: 97 mg/dL (ref 65–99)
GLUCOSE-CAPILLARY: 149 mg/dL — AB (ref 65–99)
Glucose-Capillary: 154 mg/dL — ABNORMAL HIGH (ref 65–99)

## 2015-10-31 LAB — PROTIME-INR
INR: 2.81 — ABNORMAL HIGH (ref 0.00–1.49)
Prothrombin Time: 29.1 seconds — ABNORMAL HIGH (ref 11.6–15.2)

## 2015-10-31 MED ORDER — COLCHICINE 0.6 MG PO TABS: 0.6000 mg | ORAL_TABLET | Freq: Every day | ORAL | Status: DC

## 2015-10-31 MED ORDER — ENSURE ENLIVE PO LIQD: 237.0000 mL | Freq: Two times a day (BID) | ORAL | Status: DC

## 2015-10-31 MED ORDER — WARFARIN SODIUM 2 MG PO TABS: 1.0000 mg | ORAL_TABLET | Freq: Every day | ORAL | Status: DC

## 2015-10-31 MED ORDER — METOPROLOL TARTRATE 25 MG PO TABS: 12.5000 mg | ORAL_TABLET | Freq: Two times a day (BID) | ORAL | Status: DC

## 2015-10-31 MED ORDER — AMIODARONE HCL 200 MG PO TABS: 200.0000 mg | ORAL_TABLET | Freq: Every day | ORAL | Status: DC

## 2015-10-31 MED ORDER — POTASSIUM CHLORIDE CRYS ER 20 MEQ PO TBCR: 20.0000 meq | EXTENDED_RELEASE_TABLET | Freq: Every day | ORAL | Status: DC

## 2015-10-31 MED ORDER — WARFARIN SODIUM 2 MG PO TABS: 1.0000 mg | ORAL_TABLET | Freq: Once | ORAL | Status: DC

## 2015-10-31 MED ORDER — FUROSEMIDE 40 MG PO TABS: 20.0000 mg | ORAL_TABLET | Freq: Every day | ORAL | Status: DC

## 2015-10-31 NOTE — Discharge Summary (Signed)
Discharge Summary    Patient ID: [name],  MRN: [id number], DOB/AGE: 1929/02/23 82 y.o.  Admit date: 10/17/2015 Discharge date: 10/31/2015  Primary Care Provider: [facility] Primary Cardiologist: Dr. Percival Spanish  Discharge Diagnoses    Principal Problem:   Acute on chronic diastolic CHF Active Problems:   Hypertension   Heart failure (HCC)   Pressure ulcer   Paroxysmal Atrial fibrillation   Altered mental status   Elevated LFTs and coagulopathy   Coronary artery disease s/p DES to LAD and RCA in 4/16   Normocytic anemia.   Chronic subdural hematoma    R groin wound   Dysphagia   R ankle pain   AKI   S/p mini MV repair on 10/04/2015  Allergies No Known Allergies  Diagnostic Studies/Procedures       History of Present Illness     [name] is an 82 yo white male with long-standing history of mitral valve prolapse with mitral regurgitation and chronic diastolic congestive heart failure, coronary artery disease status post PCI and stenting of the LAD and RCA using drug-eluting stents, recurrent SVT s/p DCCV, pulmonary hypertension, type 2 diabetes mellitus, hyperlipidemia, and chronic back pain who recently underwent minimally invasive Mitral Valve Repair 10/04/15 and presented [facility] ED with SOB and LE edema 10/17/15.   Patient recently underwent a minimally-invasive mitral valve repair on 3/1 and his post-op course was complicated by LVOT obstruction due to [facility], which resolved with adjustment of inotropic medications. He developed atrial fibrillation and was put on amiodarone and Coumadin. He was discharged on 3/10 to home.  Upon presentation, the patient was altered on exam and not able to answer questions consistently, so history was obtained from speaking to the wife on the phone [phone] and chart review. Per wife, he has been very confused and not himself since dischage. He has been talking all night, not making any sense. He gets up constantly to  urinate, but nothing comes out. His personality is different, as well, he has been uncharacteristically rude to his wife, telling her she was the devil. She notes that he can't swallow and easily chokes, even a small pill, since being home. His wife has been giving him his meds in apple sauce. He hasn't really ate anything of substance since coming home. He has been taking his Coumadin since discharge. His wife also notes that his LE edema has become significantly worse since discharge a few days ago.  In ED - BNP 763; Troponin (POC) 0.44; INR 6.13 (up from 3.66 on 3/10 at discharge)  Bedside sonosite echo performed by cardiology fellow was technically difficult, revealed EF >55%, possible RV dilation and low normal function. No significant mitral regurgitation was appreciated. Left atrial enlargement. Small pericardial effusion without evidence of tamponade.   Hospital Course     Consultants: Cardiothoracic Surgery, urology, would care  1. Acute on chronic diastolic CHF - significant - however tricky situation, not good response to iv lasix, soft BP and LVOT obstruction, we don't want to replete intravascular volume. Felt better after DCCV and maintaining sinus rhythm. Diuresed with IV lasix and zaroxolyn then diuretics were hold due to elevated creatinine. Leading to minimal improvement of Scr.  It was felt that managing his volume status would be hard for the remainder of his life. I&O negative 2.3L with 24lb weight loss (220-->196lb). Uncertain about I/O accuracy and weight. BP soft low. Reduced metoprolol to 12.34m BID.   2. Altered mental status/Subdural hematoma.  Given  INR >6, concern for possible intracranial bleed, CT head showed 14 mm RIGHT holo hemispheric chronic subdural hematoma without midline shift (minimally increased from prior).-- check ammonia level was 24 (WNL). Improved AMS with improvement of INR.   3. Elevated LFTs: . Likely due to hepatic congestion, although more of  an obstructive pattern, with alk phos and total bili elevated and minimal transaminitis. Less likely due to amiodarone, which was recently initiated. Abdominal ultrasound showed stones and sludge within GB and GB wall thickening, possible mild hepatic cirrhosis, minimal ascites. LFT improved with diuresis.  4. Coagulopathy: Held Coumadin as INR of > 6.  Given Vitamin K, even though his INR is < 10 and he has no overt bleeding, he is at greater risk of bleeding (age, decompensated heart failure, and expanding chronic subdural hematoma on head CT). Placed on heparin when INR was less than 2 for anticoagulation.  Coumadin managed by pharmacy. Discharge dose adjusted by pharmacy and recheck  INR in 1 week.   5. Paroxysmal Atrial fibrillation. Rate was controlled. Started on IV heparin for anticoagulation. Subsequently underwent DCCV and maintained sinus rhythm throughout. Post DCCV restarted on coumadin. hed  has been adequately loaded with amiodarone 400 mg twice a day since 10/13/15 to 10/29/15. Reduced to 254m QD as maintenance dose. After proximally 1 month if he remains in sinus rhythm, advocate discontinuation of amiodarone.  6. Leukocytosis. Normal differential. No other objective evidence of infection, likely reactive in setting of acute illness. Surgical incisions appear to be healing well. CXR was concerning for PNA and started on Ceftriaxone and Vancomycin .   7. Hematuria and UTI: hx of prostate cancer: Initial UA was clear. The patient has urinary retention at home after recent discharge. Demmed  foley was placed in ED. Than he has UTI and bloody urine in foley bag. Ceftriaxone will treat it. Seen by Urology for hematuria which removed foley with resolution of hematuria.   8. Coronary artery disease s/p DES to LAD and RCA in 4/16. EKG without acute ischemic changes. Troponin is mildly elevated (0.55-->0.41), but this is likely demand in the setting of volume overload. Was not discharged on DAPT,  but he is almost 1 year out from intervention, so low risk for stent thrombosis. Continued aspirin 81 mg, as there is no overt signs of bleeding. Continue pravastatin (unclear why he is not on a high-potency statin, but this can be addressed by his primary cardiologist)  9. Normocytic anemia. No significant difference from baseline while in the hospital, monitored closely in the setting of elevated INR. Remained stable.   10 AKI. Scr worsened with diuretics and initiation of colchicine. Recheck BMET as outpatient.   11. R ankle pain/ possible gout: Uric acid was elevated. Tx with colchicine. Improved. Decreased dose due to elevated creatinine. Discontinue colchicine during office visit. F/u with PCP.   12. Right groin wound:  superficially dehisced and slight yellow slough. Followed by wound care. Plan made to keep it dry. Will followed by Surgery closely and will schedule f/u appointment.   13. Dysphagia: underwent speech evaluation. Recommended Nectar-thick liquid.   The patient has been seen by Dr. SMarlou Porchand Dr.Owen  today and deemed ready for discharge home. All follow-up appointments have been scheduled. Discharge medications are listed below.   Will resume lasix 275mPO QD. Decrease KCL to 2046mQD. BMET in week. Would care f/u with TCTS. INR check in week.   Discharge Vitals Blood pressure 105/57, pulse 57, temperature 98.4 F (36.9 C),  temperature source Oral, resp. rate 18, height '5\' 10"'  (1.778 m), weight 196 lb 3.2 oz (88.996 kg), SpO2 96 %.  Filed Weights   10/29/15 0530 10/30/15 0624 10/31/15 0429  Weight: 203 lb (92.08 kg) 202 lb 14.4 oz (92.035 kg) 196 lb 3.2 oz (88.996 kg)    Labs & Radiologic Studies     CBC  Recent Labs  10/30/15 0406 10/31/15 0425  WBC 9.9 7.4  HGB 9.7* 9.7*  HCT 32.0* 32.6*  MCV 93.8 95.0  PLT 227 364   Basic Metabolic Panel  Recent Labs  10/30/15 0406  NA 139  K 3.9  CL 101  CO2 29  GLUCOSE 108*  BUN 37*  CREATININE 1.92*    CALCIUM 8.5*     Dg Chest 2 View  10/25/2015  CLINICAL DATA:  Atelectasis EXAM: CHEST  2 VIEW COMPARISON:  10/19/2015 chest radiograph. FINDINGS: Stable cardiomediastinal silhouette with mild-to-moderate cardiomegaly. No pneumothorax. Stable small to moderate right pleural effusion. No left pleural effusion. No overt pulmonary edema. Stable patchy right lung base opacity. IMPRESSION: 1. Stable mild-to-moderate cardiomegaly without overt pulmonary edema. 2. Stable small to moderate right pleural effusion with nonspecific right lung base patchy opacity, favor atelectasis. Electronically Signed   By: Ilona Sorrel M.D.   On: 10/25/2015 10:32   Dg Chest 2 View  10/19/2015  CLINICAL DATA:  Shortness of breath. EXAM: CHEST  2 VIEW COMPARISON:  October 17, 2015. FINDINGS: Stable cardiomegaly. No pneumothorax is noted. Left lung is clear. Stable right basilar opacity is noted concerning for edema, pneumonia or atelectasis with moderate associated pleural effusion. Bony thorax is unremarkable. IMPRESSION: Stable right basilar opacity concerning for pneumonia, atelectasis or edema with associated pleural effusion. Electronically Signed   By: Marijo Conception, M.D.   On: 10/19/2015 08:10   Dg Chest 2 View  10/12/2015  CLINICAL DATA:  82 year old male with recent cardiac surgery. Recent right chest tube for pneumothorax. Initial encounter. EXAM: CHEST  2 VIEW COMPARISON:  10/11/2015 and earlier. FINDINGS: Right chest tube removed. Trace if any right apical pneumothorax today, regressed since yesterday. Streaky peripheral and basilar opacity on the right has not significantly changed. Mildly improved lung volumes. Stable cardiac size and mediastinal contours. The patient arms are raised on the lateral view limiting its utility. Small left pleural effusion. No acute pulmonary edema. No areas of worsening ventilation. Stable visualized osseous structures. Flowing osteophytes or syndesmophytes in the thoracic spine.  IMPRESSION: 1. Right chest tube removed with trace if any residual right apical pneumothorax. 2. Small pleural effusions. Stable patchy peripheral and basilar opacity in the right lung. Electronically Signed   By: Genevie Ann M.D.   On: 10/12/2015 07:57   Dg Chest 2 View  10/11/2015  CLINICAL DATA:  Chest discomfort, chest tube in place on the right, recent mitral valve repair and subsequent wound exploration. EXAM: CHEST  2 VIEW COMPARISON:  Portable chest x-ray of October 09, 2015 FINDINGS: There is persistent mild volume loss on the right. There is a pneumothorax on the right amounting to approximately 10% or less of the lung volume. There is infiltrate in the right upper lobe which accentuates the pleural line. Patchy interstitial density in the right mid and lower lung is stable. The upper right-sided chest tube tip projects over the posterior aspect of the fifth rib. The lower chest tube tip is not well demonstrated but appears to lie in the inferior aspect of the right pleural space. There remains a small right pleural  effusion. There is no mediastinal shift. On the left there is basilar atelectasis and possible trace pleural effusion. The mid and upper lung are clear. The cardiac silhouette remains enlarged and the central pulmonary vascularity remains engorged. IMPRESSION: 10% or less right apical pneumothorax. The right-sided chest tubes are in stable position. Persistent interstitial and alveolar opacities on the right. Minimal left basilar atelectasis. Stable cardiomegaly with mild central pulmonary vascular prominence. Critical Value/emergent results were called by telephone at the time of interpretation on 10/11/2015 at 7:54 am to Shirley Muscat, RN, , who verbally acknowledged these results. Electronically Signed   By: David  Martinique M.D.   On: 10/11/2015 07:50   Dg Chest 2 View  10/02/2015  CLINICAL DATA:  Preoperative assessment for cardiac surgery ; history hypertension, coronary artery disease,  pulmonary hypertension, SVT, mitral regurgitation, chronic diastolic congestive heart failure EXAM: CHEST  2 VIEW COMPARISON:  07/05/2015 FINDINGS: Enlargement of cardiac silhouette. Mild atherosclerotic calcification aorta. Mediastinal contours and pulmonary vascularity normal. Lungs clear. No pleural effusion or pneumothorax. Diffuse osseous demineralization. IMPRESSION: Enlargement of cardiac silhouette. No acute abnormalities. Electronically Signed   By: Lavonia Dana M.D.   On: 10/02/2015 14:48   Ct Head Wo Contrast  10/18/2015  CLINICAL DATA:  Shortness of breath, altered mental status after heart surgery 2 weeks ago. History of diabetes, hypertension, prostate cancer. EXAM: CT HEAD WITHOUT CONTRAST TECHNIQUE: Contiguous axial images were obtained from the base of the skull through the vertex without intravenous contrast. COMPARISON:  CT head November 24, 2012 FINDINGS: The ventricles and sulci are normal for age. Symmetric basal ganglia mineralization. No intraparenchymal hemorrhage. Patchy supratentorial white matter hypodensities are within normal range for patient's age and though non-specific suggest sequelae of chronic small vessel ischemic disease. No acute large vascular territory infarcts. RIGHT holo hemispheric cerebral spinal fluid equivalent subdural collection was 11 mm, now 14 mm. Mass effect on subjacent sulci without midline shift. Basal cisterns are patent. Minimal calcific atherosclerosis of the carotid siphons. No skull fracture. The included ocular globes and orbital contents are non-suspicious. Status post bilateral ocular lens implants. Bilateral maxillary sinus mucosal retention cysts without air-fluid levels. Imaged mastoid air cells are well aerated. Patient is edentulous. IMPRESSION: 14 mm RIGHT holo hemispheric chronic subdural hematoma without midline shift. Otherwise negative CT head for age. Electronically Signed   By: Elon Alas M.D.   On: 10/18/2015 01:47   US Abdomen  Complete  10/18/2015  CLINICAL DATA:  Acute onset of elevated LFTs.  Initial encounter. EXAM: ABDOMEN ULTRASOUND COMPLETE COMPARISON:  CTA of the abdomen and pelvis from 08/02/2015 FINDINGS: Gallbladder: Stones and sludge are noted within the gallbladder. Gallbladder wall thickening is noted, measuring up to 5 mm. No ultrasonographic Murphy's sign is elicited. No pericholecystic fluid is seen. Common bile duct: Diameter: 0.5 cm, within normal limits in caliber. Liver: No focal lesion identified. Somewhat coarsened hepatic echotexture, raising concern for fatty infiltration. The hepatic contour is slightly nodular, raising question for mild hepatic cirrhosis. IVC: No abnormality visualized. Pancreas: Visualized portion unremarkable. Spleen: Size and appearance within normal limits. Right Kidney: Length: 11.0 cm. Echogenicity within normal limits. No mass or hydronephrosis visualized. A small amount of fluid is noted between the liver and right kidney. Left Kidney: Length: 10.7 cm. Echogenicity within normal limits. No mass or hydronephrosis visualized. Abdominal aorta: No aneurysm visualized. Not well characterized proximally or distally due to overlying structures. Other findings: None. IMPRESSION: 1. Stones and sludge within the gallbladder. Gallbladder wall thickening noted. This may  reflect chronic inflammation, though would correlate with the patient's symptoms to exclude acute cholecystitis. No evidence of obstruction. 2. Slightly nodular hepatic contour raises question for mild hepatic cirrhosis. Underlying fatty infiltration noted. 3. Trace fluid between the liver and right kidney reflects minimal ascites. Electronically Signed   By: Garald Balding M.D.   On: 10/18/2015 00:58   Dg Chest Port 1 View  10/17/2015  CLINICAL DATA:  Shortness of breath, bilateral lower extremity edema for 2 weeks. Open heart surgery 2 weeks ago. History of hypertension, CHF, diabetes, coronary artery disease, dysrhythmia EXAM:  PORTABLE CHEST 1 VIEW COMPARISON:  Chest x-rays dated 10/12/2015 and 10/09/2015. FINDINGS: Dense opacity at the right lung base is slightly more prominent on today's study, most likely a combination of atelectasis and small pleural effusion. Left lung remains clear. Cardiomegaly is stable. No pneumothorax seen. No acute osseous abnormality seen. Degenerative changes again noted about the right shoulder. IMPRESSION: Dense opacity at the right lung base appears slightly more prominent on today's study, most likely a combination of atelectasis and small pleural effusion, presumably with a slight interval increase compared to the previous exam. Stable cardiomegaly. Electronically Signed   By: Franki Cabot M.D.   On: 10/17/2015 19:18   Dg Chest Port 1 View  10/09/2015  CLINICAL DATA:  Status post mitral valve repair EXAM: PORTABLE CHEST 1 VIEW COMPARISON:  10/08/2015 FINDINGS: The cardiac shadow is enlarged but stable. Two right-sided chest tubes are again seen and stable no pneumothorax is noted. Right basilar atelectasis is noted slightly progressed when compare with the prior exam. No bony abnormality is noted. IMPRESSION: Increased right basilar atelectatic changes. Thoracostomy catheters without pneumothorax. Although not mentioned in the body of the report, a left jugular sheath remains. Electronically Signed   By: Inez Catalina M.D.   On: 10/09/2015 07:16   Dg Chest Port 1 View  10/08/2015  CLINICAL DATA:  Post MVR 10/04/2015, history mitral valve prolapse, coronary artery disease, diabetes mellitus, hypertension, former smoker, pulmonary hypertension, chronic diastolic heart failure EXAM: PORTABLE CHEST 1 VIEW COMPARISON:  Portable exam 0512 hours compared to 10/07/2015 FINDINGS: RIGHT thoracostomy tubes unchanged. LEFT jugular central venous catheter unchanged. Epicardial pacing leads noted. Enlargement of cardiac silhouette with pulmonary vascular congestion. Persistent atelectasis and minimal effusion at  mid inferior RIGHT chest. Mild LEFT basilar atelectasis and small LEFT pleural effusion. No definite pneumothorax. IMPRESSION: RIGHT thoracostomy tubes with persistent basilar atelectasis and small effusion without pneumothorax per enlargement of cardiac silhouette with pulmonary vascular congestion. Persistent mile LEFT basilar atelectasis and small pleural effusion. Electronically Signed   By: Lavonia Dana M.D.   On: 10/08/2015 07:14   Dg Chest Port 1 View  10/07/2015  CLINICAL DATA:  Congestive heart failure. EXAM: PORTABLE CHEST 1 VIEW COMPARISON:  October 06, 2015 FINDINGS: The PA catheter has been removed with a left IJ sheath remaining. Other support apparatus is stable. No pneumothorax identified. Bilateral layering effusions with underlying atelectasis are stable. More diffuse opacity remains in the right mid and lower lung. Stable cardiomegaly. No other interval changes. IMPRESSION: Interval removal of the PA catheter. Small bilateral layering effusions with underlying atelectasis. Persistent opacity in the right mid and lower lung. Electronically Signed   By: Dorise Bullion III M.D   On: 10/07/2015 07:02   Dg Chest Port 1 View  10/06/2015  CLINICAL DATA:  Status postextubation.  Recent mitral valve repair EXAM: PORTABLE CHEST 1 VIEW COMPARISON:  October 05, 2015 FINDINGS: Endotracheal tube is in nasogastric tube  have been removed. Swan-Ganz catheter tip is in the main pulmonary outflow tract. There is a chest tube on the right. Temporary pacemaker wires are attached to the right heart. No pneumothorax. There is persistent right effusion with right middle and lower lobe volume loss with patchy airspace opacity in these areas. There is also patchy airspace opacity in the left lower lobe. There is cardiomegaly. The pulmonary vascularity shows evidence of mild pulmonary venous hypertension. No adenopathy evident. IMPRESSION: Tube and catheter positions as described without pneumothorax. Findings consistent with  a degree of congestive heart failure. Atelectasis and possibly pneumonia in the right middle and both lower lobes cannot be excluded. The appearance is similar to 1 day prior. Electronically Signed   By: Lowella Grip III M.D.   On: 10/06/2015 07:55   Dg Chest Port 1 View  10/05/2015  CLINICAL DATA:  Status post mitral valve repair postop 1 and sternal re- incision. EXAM: PORTABLE CHEST 1 VIEW COMPARISON:  10/05/2015 and prior exam FINDINGS: An endotracheal tube with tip 4.2 cm above the carina, left IJ Swan-Ganz catheter with tip overlying the main pulmonary artery, and mediastinal/right thoracostomy tubes again noted. Improved right lung aeration noted with decreased edema/ airspace opacity. Bilateral lower lung opacities/ atelectasis/consolidation again noted. There is no evidence of pneumothorax. No other significant changes identified. IMPRESSION: Improved aeration bilaterally with decreased right lung edema/airspace opacity. Support apparatus as described.  No evidence of pneumothorax. Continued bilateral lower lung opacities. Electronically Signed   By: Margarette Canada M.D.   On: 10/05/2015 18:31   Dg Chest Port 1 View  10/05/2015  CLINICAL DATA:  Atelectasis. EXAM: PORTABLE CHEST 1 VIEW COMPARISON:  10/04/2015. FINDINGS: Endotracheal tube 1.7 cm above the carina, slight proximal repositioning should be considered. Swan-Ganz catheter noted with tip in the pulmonary outflow tract. NG tube noted with tip below left hemidiaphragm. Right chest tubes and mediastinal drainage catheter in stable position. Stable cardiomegaly. Cardiac valve replacement. Progressive bibasilar atelectasis and/or infiltrates.No pneumothorax. Small bilateral pleural effusions. No acute bony abnormality . Cervical spine fusion. IMPRESSION: 1. Endotracheal tube 1.7 cm above the carina, slight proximal repositioning should be considered. Remaining lines and tubes including right chest tubes in stable position. No pneumothorax 2. Low  lung volumes with progressive bibasilar atelectasis and/or infiltrates/edema. Small bilateral pleural effusions. 3.  Persistent cardiomegaly.  Prior cardiac valve replacement. Electronically Signed   By: Marcello Moores  Register   On: 10/05/2015 08:06   Dg Chest Port 1 View  10/04/2015  CLINICAL DATA:  Minimally invasive mitral valve repair a EXAM: PORTABLE CHEST 1 VIEW COMPARISON:  10/02/2015 FINDINGS: Endotracheal tube 4 cm from carina. NG tube in stomach. Swan-Ganz catheter with tip in the main pulmonary artery. Two RIGHT chest tubes in place. Volume loss in the RIGHT hemi thorax. This atelectasis the RIGHT upper lobe RIGHT lower lobe with small effusion. No pneumothorax. LEFT lung clear. IMPRESSION: 1. Support apparatus appears in good position. 2. RIGHT lung atelectasis, effusion and 2 chest tubes in RIGHT hemi thorax. Electronically Signed   By: Suzy Bouchard M.D.   On: 10/04/2015 17:41   Dg Swallowing Func-speech Pathology  10/30/2015  Objective Swallowing Evaluation: Type of Study: MBS-Modified Barium Swallow Study Patient Details Name: [name] MRN: [id number] Date of Birth: [birth year] Today's Date: 10/30/2015 Time: SLP Start Time (ACUTE ONLY): 1103-SLP Stop Time (ACUTE ONLY): 1120 SLP Time Calculation (min) (ACUTE ONLY): 17 min Past Medical History: Past Medical History Diagnosis Date . Coronary artery disease    a. 11/2014  NSTEMI: DESx 2 placed to LAD and RCA  . Mitral valve prolapse  . Diabetes mellitus, type II ([place])    a. HgA1c 6.8 in 03/2015 . Mitral regurgitation    a. severe wtih flail leaflet by 2D ECHO (06/05/15) . SVT (supraventricular tachycardia) (HCC)    a. recurrent, usually responsive to vagal manuevers . HTN (hypertension)  . Pulmonary HTN ([place])  . Lower extremity edema    a. chronic . HLD (hyperlipidemia)  . Hypertension  . OA (osteoarthritis)  . ED (erectile dysfunction)  . Chronic fatigue  . Gout  . Spinal stenosis  . Prostate cancer ([place])  . Severe mitral regurgitation 11/17/2014    Mitral valve prolapse with flail segment of anterior leaflet  . Chronic diastolic (congestive) heart failure ([place])  . Hemangioma of liver 08/02/2015   12 mm enhancing lesion seen on CT - suspicious for benign hemangioma but not diagnostic - MRI recommended . Dysrhythmia  . Heart murmur  . Shortness of breath dyspnea  . Kidney stones  . Physical deconditioning  . S/P minimally invasive mitral valve repair 10/04/2015   Complex valvuloplasty including artificial Gore-tex neochord placement x6 with plication closure of cleft between P2 and P3 and 30 mm Sorin Memo 3D ring annuloplasty via right mini thoracotomy approach  . Hypertensive left ventricular hypertrophy with heart failure ([place]) 10/04/2015 . Hypertensive cardiomyopathy [facility])  Past Surgical History: Past Surgical History Procedure Laterality Date . Cardiac surgery     2 cardiac stents . Joint replacement     shoulder . Back surgery x 2   . Rt rotator cuff repair   . Cervical spine surgery   . Back surgery   . Left heart catheterization with coronary angiogram N/A 11/16/2014   Procedure: LEFT HEART CATHETERIZATION WITH CORONARY ANGIOGRAM;  Surgeon: Troy Sine, MD;  Location: [facility] CATH LAB;  Service: Cardiovascular;  Laterality: N/A; . Tee without cardioversion N/A 06/22/2015   Procedure: TRANSESOPHAGEAL ECHOCARDIOGRAM (TEE);  Surgeon: Skeet Latch, MD;  Location: [place];  Service: Cardiovascular;  Laterality: N/A; . Cardiac catheterization N/A 08/08/2015   Procedure: Right/Left Heart Cath and Coronary Angiography;  Surgeon: Sherren Mocha, MD;  Location: [place] CV LAB;  Service: Cardiovascular;  Laterality: N/A; . Eye surgery   . Mitral valve repair Right 10/04/2015   Procedure: MINIMALLY INVASIVE MITRAL VALVE REPAIR (MVR);  Surgeon: Rexene Alberts, MD;  Location: [place];  Service: Open Heart Surgery;  Laterality: Right; . Tee without cardioversion N/A 10/04/2015   Procedure: TRANSESOPHAGEAL ECHOCARDIOGRAM (TEE);  Surgeon: Rexene Alberts, MD;  Location: [place];  Service: Open Heart Surgery;  Laterality: N/A; . Wound exploration N/A 10/05/2015   Procedure: Sternal Incision;  Surgeon: Rexene Alberts, MD;  Location: [place];  Service: Open Heart Surgery;  Laterality: N/A; . Intraoperative transesophageal echocardiogram  10/05/2015   Procedure: INTRAOPERATIVE TRANSESOPHAGEAL ECHOCARDIOGRAM;  Surgeon: Rexene Alberts, MD;  Location: [place];  Service: Open Heart Surgery;; . Cardioversion N/A 10/20/2015   Procedure: CARDIOVERSION;  Surgeon: Pixie Casino, MD;  Location: [facility] ENDOSCOPY;  Service: Cardiovascular;  Laterality: N/A; HPI: [name] is an 82 yo white male with chronic diastolic CHF, ACDF R5-J8 (pt unable to recall date), cervical spinal stenosis, coronary artery disease , recurrent SVT, pulmonary hypertension, type 2 diabetes mellitus, hyperlipidemia, and chronic back pain who recently underwent minimally invasive Mitral Valve Repair 10/04/15 presented with SOB and LE edema 10/17/15, also increased INR and inability to ambulate at home.CXR 3/22 stable small to moderate right  pleural effusion with nonspecific right lung base patchy opacity, favor atelectasis. RN initiated order after observing difficulty with pills.  No Data Recorded Assessment / Plan / Recommendation CHL IP CLINICAL IMPRESSIONS 10/30/2015 Therapy Diagnosis Mild pharyngeal phase dysphagia;Moderate pharyngeal phase dysphagia Clinical Impression MBS complete. Pt exhibited mild-mod pharyngeal phase dysphagia characterized by anatomical etiology (ACDF C4-5 and cervical spine stenosis) and sensory deficits. Delayed swallow initiation to the vallecula resulted in silent penetration before the swallow above the level of the vocal cords. SLP observed bony growth which significantly reduced diameter of pharynx. Decreased patency of pharynx culminated in reduced epiglottic inversion and silent penetration during the swallow above the level of the vocal cords. Pt instructed to utilize chin tuck during intake of thin  liquids to reduce airway compromise, however unsuccessful in prevention of penetration. Reduced epiglottic inversion and decreased base of tongue retraction resulted in vallecular residue, which then led to silent penetration after the swallow. Due to anatomical presentation during this study, suspect chronic dysphagia due to reduced pharyngeal patency. Pt has never had PNA and likely able to compensate for impairments at home, however recommend Dysphagia 3 (mehcanical soft) and nectar thick liquids during admission due to deconditioning. Pt educated re: results of MBS, compensatory strategies (swallow x2 after every bite/sip, small bites/sips, follow solids with liquids, clear throat intermittently) and diet recommendation. Impact on safety and function Moderate aspiration risk   CHL IP TREATMENT RECOMMENDATION 10/30/2015 Treatment Recommendations Therapy as outlined in treatment plan below   Prognosis 10/30/2015 Prognosis for Safe Diet Advancement Fair Barriers to Reach Goals Severity of deficits Barriers/Prognosis Comment -- CHL IP DIET RECOMMENDATION 10/30/2015 SLP Diet Recommendations Dysphagia 3 (Mech soft) solids;Nectar thick liquid Liquid Administration via Cup;No straw Medication Administration Crushed with puree Compensations Minimize environmental distractions;Slow rate;Small sips/bites;Multiple dry swallows after each bite/sip;Clear throat intermittently;Follow solids with liquid Postural Changes Seated upright at 90 degrees   CHL IP OTHER RECOMMENDATIONS 10/30/2015 Recommended Consults -- Oral Care Recommendations Oral care BID Other Recommendations Order thickener from pharmacy;Prohibited food (jello, ice cream, thin soups);Remove water pitcher   CHL IP FOLLOW UP RECOMMENDATIONS 10/30/2015 Follow up Recommendations Home health SLP   CHL IP FREQUENCY AND DURATION 10/30/2015 Speech Therapy Frequency (ACUTE ONLY) min 2x/week Treatment Duration 2 weeks      CHL IP ORAL PHASE 10/30/2015 Oral Phase WFL Oral -  Pudding Teaspoon -- Oral - Pudding Cup -- Oral - Honey Teaspoon -- Oral - Honey Cup -- Oral - Nectar Teaspoon -- Oral - Nectar Cup -- Oral - Nectar Straw -- Oral - Thin Teaspoon -- Oral - Thin Cup -- Oral - Thin Straw -- Oral - Puree -- Oral - Mech Soft -- Oral - Regular -- Oral - Multi-Consistency -- Oral - Pill -- Oral Phase - Comment --  CHL IP PHARYNGEAL PHASE 10/30/2015 Pharyngeal Phase Impaired Pharyngeal- Pudding Teaspoon -- Pharyngeal -- Pharyngeal- Pudding Cup -- Pharyngeal -- Pharyngeal- Honey Teaspoon -- Pharyngeal -- Pharyngeal- Honey Cup -- Pharyngeal -- Pharyngeal- Nectar Teaspoon -- Pharyngeal -- Pharyngeal- Nectar Cup Delayed swallow initiation-vallecula;Pharyngeal residue - valleculae;Reduced tongue base retraction;Penetration/Aspiration during swallow;Reduced epiglottic inversion;Reduced airway/laryngeal closure;Penetration/Aspiration before swallow Pharyngeal Material enters airway, remains ABOVE vocal cords and not ejected out Pharyngeal- Nectar Straw -- Pharyngeal -- Pharyngeal- Thin Teaspoon Penetration/Aspiration during swallow;Penetration/Apiration after swallow;Delayed swallow initiation-vallecula;Reduced epiglottic inversion;Reduced airway/laryngeal closure;Reduced tongue base retraction;Pharyngeal residue - valleculae;Compensatory strategies attempted (with notebox) Pharyngeal Material enters airway, remains ABOVE vocal cords and not ejected out Pharyngeal- Thin Cup -- Pharyngeal -- Pharyngeal- Thin Straw -- Pharyngeal --  Pharyngeal- Puree -- Pharyngeal -- Pharyngeal- Mechanical Soft -- Pharyngeal -- Pharyngeal- Regular Delayed swallow initiation-vallecula;Pharyngeal residue - valleculae Pharyngeal -- Pharyngeal- Multi-consistency -- Pharyngeal -- Pharyngeal- Pill -- Pharyngeal -- Pharyngeal Comment --  CHL IP CERVICAL ESOPHAGEAL PHASE 10/30/2015 Cervical Esophageal Phase Impaired Pudding Teaspoon -- Pudding Cup -- Honey Teaspoon -- Honey Cup -- Nectar Teaspoon -- Nectar Cup -- Nectar Straw  -- Thin Teaspoon WFL Thin Cup -- Thin Straw -- Puree -- Mechanical Soft -- Regular -- Multi-consistency -- Pill -- Cervical Esophageal Comment -- No flowsheet data found. Houston Siren 10/30/2015, 1:36 PM Orbie Pyo Colvin Caroli.Ed CCC-SLP Pager [phone]               Disposition   Pt is being discharged home today in good condition.  Follow-up Plans & Appointments    Follow-up Information    Follow up with [facility].   Specialty:  [place]   Contact information:   [place] [phone]      Follow up with Rexene Alberts, MD.   Specialty:  Cardiothoracic Surgery   Why:  PA/LAT CXR to be taken (at [facility] which is the same building as Dr. Guy Sandifer office) on at 11/21/2015 at 2:45 [id number] with Dr. Roxy Manns is at 3:30   Contact information:   [place] [address] [phone]       Follow up with [facility] On 11/06/2015.   Specialty:  Cardiology   Why:   @ 3:00 for TCM appointment with Lonn Georgia, PA-C and INR check with pharmacist   Contact information:   [address] [place] [phone]     Discharge Instructions    AMB Referral to [facility]    Complete by:  As directed   Reason for consult:  Active patient  Expected date of contact:  1-3 days (reserved for hospital discharges)  Looks like this [facility] was [facility] to [facility] but is showing up as inactive.  Please assign patient to community RN for post discharge follow up.     AMB Referral to [facility]    Complete by:  As directed   Reason for consult:  Active patient discharge to skilled facility  Expected date of contact:  1-3 days (reserved for hospital discharges)  Please assign patient to social worker for post hospital follow up at skilled facility for follow up for return home after rehab.  Patient had been  previously assigned to [facility].  Patient's disposition changed to skilled facility.  Natividad Brood, RN BSN [facility] Liaison  [phone] business mobile phone Toll free office [phone]     Call MD for:  difficulty breathing, headache or visual disturbances    Complete by:  As directed      Call MD for:  persistant dizziness or light-headedness    Complete by:  As directed      Call MD for:  redness, tenderness, or signs of infection (pain, swelling, redness, odor or green/yellow discharge around incision site)    Complete by:  As directed      Diet - low sodium heart healthy    Complete by:  As directed      Increase activity slowly    Complete by:  As directed            Discharge Medications   Current Discharge Medication List  START taking these medications   Details  colchicine 0.6 MG tablet Take 1 tablet (0.6 mg total) by mouth daily. Qty: 14 tablet, Refills: 0    feeding supplement, ENSURE ENLIVE, (ENSURE ENLIVE) LIQD Take 237 mLs by mouth 2 (two) times daily. Qty: 237 mL, Refills: 12      CONTINUE these medications which have CHANGED   Details  amiodarone (PACERONE) 200 MG tablet Take 1 tablet (200 mg total) by mouth daily. Qty: 30 tablet, Refills: 1    furosemide (LASIX) 40 MG tablet Take 0.5 tablets (20 mg total) by mouth daily. Qty: 30 tablet, Refills: 1   Associated Diagnoses: Chronic diastolic congestive heart failure (HCC)    metoprolol tartrate (LOPRESSOR) 25 MG tablet Take 0.5 tablets (12.5 mg total) by mouth 2 (two) times daily. Qty: 60 tablet, Refills: 1    potassium chloride SA (K-DUR,KLOR-CON) 20 MEQ tablet Take 1 tablet (20 mEq total) by mouth daily. Qty: 30 tablet, Refills: 1    warfarin (COUMADIN) 2 MG tablet Take 0.5 tablets (1 mg total) by mouth daily at 6 PM. Or as directed Qty: 30 tablet, Refills: 1      CONTINUE these medications which have NOT CHANGED   Details  acetaminophen (TYLENOL) 500 MG tablet Take 2  tablets (1,000 mg total) by mouth every 6 (six) hours as needed for mild pain or fever. Qty: 30 tablet, Refills: 0    aspirin EC 81 MG tablet Take 81 mg by mouth daily.    finasteride (PROSCAR) 5 MG tablet TAKE ONE (1) TABLET EACH DAY Qty: 30 tablet, Refills: 3    fluticasone (FLONASE) 50 MCG/ACT nasal spray Place 1 spray into both nostrils daily as needed for allergies or rhinitis.    Menthol-Methyl Salicylate (MUSCLE RUB) 10-15 % CREA Apply 1 application topically 2 (two) times daily as needed for muscle pain.     pravastatin (PRAVACHOL) 40 MG tablet Take 1 tablet (40 mg total) by mouth daily at 6 PM. Qty: 30 tablet, Refills: 11    tamsulosin (FLOMAX) 0.4 MG CAPS capsule Take 1 capsule (0.4 mg total) by mouth daily. Qty: 90 capsule, Refills: 1    traMADol (ULTRAM) 50 MG tablet Take 1 tablet (50 mg total) by mouth every 6 (six) hours as needed for moderate pain. Qty: 20 tablet, Refills: 0           Outstanding Labs/Studies   BMET and INR check in week. Would care f/u with TCTS.   Duration of Discharge Encounter   Greater than 30 minutes including physician time.  Signed, Bhagat,Bhavinkumar PA-C 10/31/2015, 2:22 PM    Personally seen and examined. Agree with above. Will resume lasix 28m PO QD. Decrease KCL to 267m QD. OK for DC. Discussed with TCTS team. Dry 4x4 dressings to wound.  SKCandee FurbishMD

## 2015-10-31 NOTE — Telephone Encounter (Signed)
New message       TCM appt on 11-06-15 with Rhonda Barrett per Vin.

## 2015-10-31 NOTE — Progress Notes (Signed)
Patient Name: [name] Date of Encounter: 10/31/2015   SUBJECTIVE  No chest pain or sOB.   CURRENT MEDS . amiodarone  200 mg Oral Daily  . antiseptic oral rinse  7 mL Mouth Rinse BID  . aspirin EC  81 mg Oral Daily  . colchicine  0.6 mg Oral Daily  . feeding supplement (ENSURE ENLIVE)  237 mL Oral BID  . finasteride  5 mg Oral Daily  . metoprolol tartrate  12.5 mg Oral BID  . multivitamin with minerals  1 tablet Oral Daily  . potassium chloride  40 mEq Oral Daily  . pravastatin  40 mg Oral q1800  . sodium chloride flush  3 mL Intravenous Q12H  . tamsulosin  0.4 mg Oral Daily  . warfarin  1 mg Oral ONCE-1800  . Warfarin - Pharmacist Dosing Inpatient   Does not apply q1800    OBJECTIVE  Filed Vitals:   10/30/15 1210 10/30/15 2058 10/31/15 0429 10/31/15 1042  BP: 110/67 120/63 109/56 127/69  Pulse: 62 71 68 85  Temp: 97.9 F (36.6 C) 98.4 F (36.9 C) 98.2 F (36.8 C)   TempSrc: Oral Oral Oral   Resp: 16 18 18    Height:      Weight:   196 lb 3.2 oz (88.996 kg)   SpO2: 96% 100% 96%     Intake/Output Summary (Last 24 hours) at 10/31/15 1142 Last data filed at 10/31/15 0900  Gross per 24 hour  Intake    530 ml  Output    751 ml  Net   -221 ml   Filed Weights   10/29/15 0530 10/30/15 0624 10/31/15 0429  Weight: 203 lb (92.08 kg) 202 lb 14.4 oz (92.035 kg) 196 lb 3.2 oz (88.996 kg)    PHYSICAL EXAM  General: Pleasant, NAD. Neuro: Alert and oriented X 3. Moves all extremities spontaneously. Psych: Normal affect. HEENT:  Normal  Neck: Supple without bruits. + JVD. Lungs:  Resp regular and unlabored. Faint rales Heart: RRR no s3, s4. Systolic murmurs. Abdomen: Soft, non-tender, non-distended, BS + x 4.  Extremities: No clubbing, cyanosis or edema. DP/PT/Radials 2+ and equal bilaterally.  Accessory Clinical Findings  CBC  Recent Labs  10/30/15 0406 10/31/15 0425  WBC 9.9 7.4  HGB 9.7* 9.7*  HCT 32.0* 32.6*  MCV 93.8 95.0  PLT 227 [id number]    Basic Metabolic Panel  Recent Labs  10/30/15 0406  NA 139  K 3.9  CL 101  CO2 29  GLUCOSE 108*  BUN 37*  CREATININE 1.92*  CALCIUM 8.5*    TELE  Sinus rhythm   Radiology/Studies  Dg Chest 2 View  10/25/2015  CLINICAL DATA:  Atelectasis EXAM: CHEST  2 VIEW COMPARISON:  10/19/2015 chest radiograph. FINDINGS: Stable cardiomediastinal silhouette with mild-to-moderate cardiomegaly. No pneumothorax. Stable small to moderate right pleural effusion. No left pleural effusion. No overt pulmonary edema. Stable patchy right lung base opacity. IMPRESSION: 1. Stable mild-to-moderate cardiomegaly without overt pulmonary edema. 2. Stable small to moderate right pleural effusion with nonspecific right lung base patchy opacity, favor atelectasis. Electronically Signed   By: [name] M.D.   On: 10/25/2015 10:32   Dg Chest 2 View  10/19/2015  CLINICAL DATA:  Shortness of breath. EXAM: CHEST  2 VIEW COMPARISON:  October 17, 2015. FINDINGS: Stable cardiomegaly. No pneumothorax is noted. Left lung is clear. Stable right basilar opacity is noted concerning for edema, pneumonia or atelectasis with moderate associated pleural effusion. Bony thorax is unremarkable. IMPRESSION:  Stable right basilar opacity concerning for pneumonia, atelectasis or edema with associated pleural effusion. Electronically Signed   By: [name], M.D.   On: 10/19/2015 08:10   Dg Chest 2 View  10/12/2015  CLINICAL DATA:  82 year old male with recent cardiac surgery. Recent right chest tube for pneumothorax. Initial encounter. EXAM: CHEST  2 VIEW COMPARISON:  10/11/2015 and earlier. FINDINGS: Right chest tube removed. Trace if any right apical pneumothorax today, regressed since yesterday. Streaky peripheral and basilar opacity on the right has not significantly changed. Mildly improved lung volumes. Stable cardiac size and mediastinal contours. The patient arms are raised on the lateral view limiting its utility. Small left  pleural effusion. No acute pulmonary edema. No areas of worsening ventilation. Stable visualized osseous structures. Flowing osteophytes or syndesmophytes in the thoracic spine. IMPRESSION: 1. Right chest tube removed with trace if any residual right apical pneumothorax. 2. Small pleural effusions. Stable patchy peripheral and basilar opacity in the right lung. Electronically Signed   By: [name] M.D.   On: 10/12/2015 07:57   Dg Chest 2 View  10/11/2015  CLINICAL DATA:  Chest discomfort, chest tube in place on the right, recent mitral valve repair and subsequent wound exploration. EXAM: CHEST  2 VIEW COMPARISON:  Portable chest x-ray of October 09, 2015 FINDINGS: There is persistent mild volume loss on the right. There is a pneumothorax on the right amounting to approximately 10% or less of the lung volume. There is infiltrate in the right upper lobe which accentuates the pleural line. Patchy interstitial density in the right mid and lower lung is stable. The upper right-sided chest tube tip projects over the posterior aspect of the fifth rib. The lower chest tube tip is not well demonstrated but appears to lie in the inferior aspect of the right pleural space. There remains a small right pleural effusion. There is no mediastinal shift. On the left there is basilar atelectasis and possible trace pleural effusion. The mid and upper lung are clear. The cardiac silhouette remains enlarged and the central pulmonary vascularity remains engorged. IMPRESSION: 10% or less right apical pneumothorax. The right-sided chest tubes are in stable position. Persistent interstitial and alveolar opacities on the right. Minimal left basilar atelectasis. Stable cardiomegaly with mild central pulmonary vascular prominence. Critical Value/emergent results were called by telephone at the time of interpretation on 10/11/2015 at 7:54 am to Fred Muscat, RN, , who verbally acknowledged these results. Electronically Signed   By: Fred  Bradshaw  M.D.   On: 10/11/2015 07:50   Dg Chest 2 View  10/02/2015  CLINICAL DATA:  Preoperative assessment for cardiac surgery ; history hypertension, coronary artery disease, pulmonary hypertension, SVT, mitral regurgitation, chronic diastolic congestive heart failure EXAM: CHEST  2 VIEW COMPARISON:  07/05/2015 FINDINGS: Enlargement of cardiac silhouette. Mild atherosclerotic calcification aorta. Mediastinal contours and pulmonary vascularity normal. Lungs clear. No pleural effusion or pneumothorax. Diffuse osseous demineralization. IMPRESSION: Enlargement of cardiac silhouette. No acute abnormalities. Electronically Signed   By: Lavonia Dana M.D.   On: 10/02/2015 14:48   Ct Head Wo Contrast  10/18/2015  CLINICAL DATA:  Shortness of breath, altered mental status after heart surgery 2 weeks ago. History of diabetes, hypertension, prostate cancer. EXAM: CT HEAD WITHOUT CONTRAST TECHNIQUE: Contiguous axial images were obtained from the base of the skull through the vertex without intravenous contrast. COMPARISON:  CT head November 24, 2012 FINDINGS: The ventricles and sulci are normal for age. Symmetric basal ganglia mineralization. No intraparenchymal hemorrhage. Patchy supratentorial  white matter hypodensities are within normal range for patient's age and though non-specific suggest sequelae of chronic small vessel ischemic disease. No acute large vascular territory infarcts. RIGHT holo hemispheric cerebral spinal fluid equivalent subdural collection was 11 mm, now 14 mm. Mass effect on subjacent sulci without midline shift. Basal cisterns are patent. Minimal calcific atherosclerosis of the carotid siphons. No skull fracture. The included ocular globes and orbital contents are non-suspicious. Status post bilateral ocular lens implants. Bilateral maxillary sinus mucosal retention cysts without air-fluid levels. Imaged mastoid air cells are well aerated. Patient is edentulous. 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 Otherwise negative CT head for age. Electronically Signed   By: Elon Alas M.D.   On: 10/18/2015 01:47   US Abdomen Complete  10/18/2015  CLINICAL DATA:  Acute onset of elevated LFTs.  Initial encounter. EXAM: ABDOMEN ULTRASOUND COMPLETE COMPARISON:  CTA of the abdomen and pelvis from 08/02/2015 FINDINGS: Gallbladder: Stones and sludge are noted within the gallbladder. Gallbladder wall thickening is noted, measuring up to 5 mm. No ultrasonographic Murphy's sign is elicited. No pericholecystic fluid is seen. Common bile duct: Diameter: 0.5 cm, within normal limits in caliber. Liver: No focal lesion identified. Somewhat coarsened hepatic echotexture, raising concern for fatty infiltration. The hepatic contour is slightly nodular, raising question for mild hepatic cirrhosis. IVC: No abnormality visualized. Pancreas: Visualized portion unremarkable. Spleen: Size and appearance within normal limits. Right Kidney: Length: 11.0 cm. Echogenicity within normal limits. No mass or hydronephrosis visualized. A small amount of fluid is noted between the liver and right kidney. Left Kidney: Length: 10.7 cm. Echogenicity within normal limits. No mass or hydronephrosis visualized. Abdominal aorta: No aneurysm visualized. Not well characterized proximally or distally due to overlying structures. Other findings: None. IMPRESSION: 1. Stones and sludge within the gallbladder. Gallbladder wall thickening noted. This may reflect chronic inflammation, though would correlate with the patient's symptoms to exclude acute cholecystitis. No evidence of obstruction. 2. Slightly nodular hepatic contour raises question for mild hepatic cirrhosis. Underlying fatty infiltration noted. 3. Trace fluid between the liver and right kidney reflects minimal ascites. Electronically Signed   By: Garald Balding M.D.   On: 10/18/2015 00:58   Dg Chest Port 1 View  10/17/2015  CLINICAL DATA:   Shortness of breath, bilateral lower extremity edema for 2 weeks. Open heart surgery 2 weeks ago. History of hypertension, CHF, diabetes, coronary artery disease, dysrhythmia EXAM: PORTABLE CHEST 1 VIEW COMPARISON:  Chest x-rays dated 10/12/2015 and 10/09/2015. FINDINGS: Dense opacity at the right lung base is slightly more prominent on today's study, most likely a combination of atelectasis and small pleural effusion. Left lung remains clear. Cardiomegaly is stable. No pneumothorax seen. No acute osseous abnormality seen. Degenerative changes again noted about the right shoulder. IMPRESSION: Dense opacity at the right lung base appears slightly more prominent on today's study, most likely a combination of atelectasis and small pleural effusion, presumably with a slight interval increase compared to the previous exam. Stable cardiomegaly. Electronically Signed   By: Franki Cabot M.D.   On: 10/17/2015 19:18   Dg Chest Port 1 View  10/09/2015  CLINICAL DATA:  Status post mitral valve repair EXAM: PORTABLE CHEST 1 VIEW COMPARISON:  10/08/2015 FINDINGS: The cardiac shadow is enlarged but stable. Two right-sided chest tubes are again seen and stable no pneumothorax is noted. Right basilar atelectasis is noted slightly progressed when compare with the prior exam. No bony abnormality is noted. IMPRESSION: Increased right basilar  atelectatic changes. Thoracostomy catheters without pneumothorax. Although not mentioned in the body of the report, a left jugular sheath remains. Electronically Signed   By: Inez Catalina M.D.   On: 10/09/2015 07:16   Dg Chest Port 1 View  10/08/2015  CLINICAL DATA:  Post MVR 10/04/2015, history mitral valve prolapse, coronary artery disease, diabetes mellitus, hypertension, former smoker, pulmonary hypertension, chronic diastolic heart failure EXAM: PORTABLE CHEST 1 VIEW COMPARISON:  Portable exam 0512 hours compared to 10/07/2015 FINDINGS: RIGHT thoracostomy tubes unchanged. LEFT jugular  central venous catheter unchanged. Epicardial pacing leads noted. Enlargement of cardiac silhouette with pulmonary vascular congestion. Persistent atelectasis and minimal effusion at mid inferior RIGHT chest. Mild LEFT basilar atelectasis and small LEFT pleural effusion. No definite pneumothorax. IMPRESSION: RIGHT thoracostomy tubes with persistent basilar atelectasis and small effusion without pneumothorax per enlargement of cardiac silhouette with pulmonary vascular congestion. Persistent mile LEFT basilar atelectasis and small pleural effusion. Electronically Signed   By: Lavonia Dana M.D.   On: 10/08/2015 07:14   Dg Chest Port 1 View  10/07/2015  CLINICAL DATA:  Congestive heart failure. EXAM: PORTABLE CHEST 1 VIEW COMPARISON:  October 06, 2015 FINDINGS: The PA catheter has been removed with a left IJ sheath remaining. Other support apparatus is stable. No pneumothorax identified. Bilateral layering effusions with underlying atelectasis are stable. More diffuse opacity remains in the right mid and lower lung. Stable cardiomegaly. No other interval changes. IMPRESSION: Interval removal of the PA catheter. Small bilateral layering effusions with underlying atelectasis. Persistent opacity in the right mid and lower lung. Electronically Signed   By: Dorise Bullion III M.D   On: 10/07/2015 07:02   Dg Chest Port 1 View  10/06/2015  CLINICAL DATA:  Status postextubation.  Recent mitral valve repair EXAM: PORTABLE CHEST 1 VIEW COMPARISON:  October 05, 2015 FINDINGS: Endotracheal tube is in nasogastric tube have been removed. Swan-Ganz catheter tip is in the main pulmonary outflow tract. There is a chest tube on the right. Temporary pacemaker wires are attached to the right heart. No pneumothorax. There is persistent right effusion with right middle and lower lobe volume loss with patchy airspace opacity in these areas. There is also patchy airspace opacity in the left lower lobe. There is cardiomegaly. The pulmonary  vascularity shows evidence of mild pulmonary venous hypertension. No adenopathy evident. IMPRESSION: Tube and catheter positions as described without pneumothorax. Findings consistent with a degree of congestive heart failure. Atelectasis and possibly pneumonia in the right middle and both lower lobes cannot be excluded. The appearance is similar to 1 day prior. Electronically Signed   By: Lowella Grip III M.D.   On: 10/06/2015 07:55   Dg Chest Port 1 View  10/05/2015  CLINICAL DATA:  Status post mitral valve repair postop 1 and sternal re- incision. EXAM: PORTABLE CHEST 1 VIEW COMPARISON:  10/05/2015 and prior exam FINDINGS: An endotracheal tube with tip 4.2 cm above the carina, left IJ Swan-Ganz catheter with tip overlying the main pulmonary artery, and mediastinal/right thoracostomy tubes again noted. Improved right lung aeration noted with decreased edema/ airspace opacity. Bilateral lower lung opacities/ atelectasis/consolidation again noted. There is no evidence of pneumothorax. No other significant changes identified. IMPRESSION: Improved aeration bilaterally with decreased right lung edema/airspace opacity. Support apparatus as described.  No evidence of pneumothorax. Continued bilateral lower lung opacities. Electronically Signed   By: Margarette Canada M.D.   On: 10/05/2015 18:31   Dg Chest Port 1 View  10/05/2015  CLINICAL DATA:  Atelectasis. EXAM:  PORTABLE CHEST 1 VIEW COMPARISON:  10/04/2015. FINDINGS: Endotracheal tube 1.7 cm above the carina, slight proximal repositioning should be considered. Swan-Ganz catheter noted with tip in the pulmonary outflow tract. NG tube noted with tip below left hemidiaphragm. Right chest tubes and mediastinal drainage catheter in stable position. Stable cardiomegaly. Cardiac valve replacement. Progressive bibasilar atelectasis and/or infiltrates.No pneumothorax. Small bilateral pleural effusions. No acute bony abnormality . Cervical spine fusion. IMPRESSION: 1.  Endotracheal tube 1.7 cm above the carina, slight proximal repositioning should be considered. Remaining lines and tubes including right chest tubes in stable position. No pneumothorax 2. Low lung volumes with progressive bibasilar atelectasis and/or infiltrates/edema. Small bilateral pleural effusions. 3.  Persistent cardiomegaly.  Prior cardiac valve replacement. Electronically Signed   By: Marcello Moores  Register   On: 10/05/2015 08:06   Dg Chest Port 1 View  10/04/2015  CLINICAL DATA:  Minimally invasive mitral valve repair a EXAM: PORTABLE CHEST 1 VIEW COMPARISON:  10/02/2015 FINDINGS: Endotracheal tube 4 cm from carina. NG tube in stomach. Swan-Ganz catheter with tip in the main pulmonary artery. Two RIGHT chest tubes in place. Volume loss in the RIGHT hemi thorax. This atelectasis the RIGHT upper lobe RIGHT lower lobe with small effusion. No pneumothorax. LEFT lung clear. IMPRESSION: 1. Support apparatus appears in good position. 2. RIGHT lung atelectasis, effusion and 2 chest tubes in RIGHT hemi thorax. Electronically Signed   By: Suzy Bouchard M.D.   On: 10/04/2015 17:41   Dg Swallowing Func-speech Pathology  10/30/2015  Objective Swallowing Evaluation: Type of Study: MBS-Modified Barium Swallow Study Patient Details Name: CORGAN BLIGH MRN: [id number] Date of Birth: 01/21/1929 Today's Date: 10/30/2015 Time: SLP Start Time (ACUTE ONLY): 1103-SLP Stop Time (ACUTE ONLY): 1120 SLP Time Calculation (min) (ACUTE ONLY): 17 min Past Medical History: Past Medical History Diagnosis Date . Coronary artery disease    a. 11/2014 NSTEMI: DESx 2 placed to LAD and RCA  . Mitral valve prolapse  . Diabetes mellitus, type II ([place])    a. HgA1c 6.8 in 03/2015 . Mitral regurgitation    a. severe wtih flail leaflet by 2D ECHO (06/05/15) . SVT (supraventricular tachycardia) ([facility])    a. recurrent, usually responsive to vagal manuevers . HTN (hypertension)  . Pulmonary HTN ([place])  . Lower extremity edema    a. chronic . HLD  (hyperlipidemia)  . Hypertension  . OA (osteoarthritis)  . ED (erectile dysfunction)  . Chronic fatigue  . Gout  . Spinal stenosis  . Prostate cancer ([place])  . Severe mitral regurgitation 11/17/2014   Mitral valve prolapse with flail segment of anterior leaflet  . Chronic diastolic (congestive) heart failure ([place])  . Hemangioma of liver 08/02/2015   12 mm enhancing lesion seen on CT - suspicious for benign hemangioma but not diagnostic - MRI recommended . Dysrhythmia  . Heart murmur  . Shortness of breath dyspnea  . Kidney stones  . Physical deconditioning  . S/P minimally invasive mitral valve repair 10/04/2015   Complex valvuloplasty including artificial Gore-tex neochord placement x6 with plication closure of cleft between P2 and P3 and 30 mm Sorin Memo 3D ring annuloplasty via right mini thoracotomy approach  . Hypertensive left ventricular hypertrophy with heart failure ([place]) 10/04/2015 . Hypertensive cardiomyopathy [facility])  Past Surgical History: Past Surgical History Procedure Laterality Date . Cardiac surgery     2 cardiac stents . Joint replacement     shoulder . Back surgery x 2   . Rt rotator cuff repair   . Cervical spine  surgery   . Back surgery   . Left heart catheterization with coronary angiogram N/A 11/16/2014   Procedure: LEFT HEART CATHETERIZATION WITH CORONARY ANGIOGRAM;  Surgeon: Troy Sine, MD;  Location: [facility] CATH LAB;  Service: Cardiovascular;  Laterality: N/A; . Tee without cardioversion N/A 06/22/2015   Procedure: TRANSESOPHAGEAL ECHOCARDIOGRAM (TEE);  Surgeon: Skeet Latch, MD;  Location: [place];  Service: Cardiovascular;  Laterality: N/A; . Cardiac catheterization N/A 08/08/2015   Procedure: Right/Left Heart Cath and Coronary Angiography;  Surgeon: Sherren Mocha, MD;  Location: [place] CV LAB;  Service: Cardiovascular;  Laterality: N/A; . Eye surgery   . Mitral valve repair Right 10/04/2015   Procedure: MINIMALLY INVASIVE MITRAL VALVE REPAIR (MVR);  Surgeon: Rexene Alberts, MD;   Location: [place];  Service: Open Heart Surgery;  Laterality: Right; . Tee without cardioversion N/A 10/04/2015   Procedure: TRANSESOPHAGEAL ECHOCARDIOGRAM (TEE);  Surgeon: Rexene Alberts, MD;  Location: [place];  Service: Open Heart Surgery;  Laterality: N/A; . Wound exploration N/A 10/05/2015   Procedure: Sternal Incision;  Surgeon: Rexene Alberts, MD;  Location: [place];  Service: Open Heart Surgery;  Laterality: N/A; . Intraoperative transesophageal echocardiogram  10/05/2015   Procedure: INTRAOPERATIVE TRANSESOPHAGEAL ECHOCARDIOGRAM;  Surgeon: Rexene Alberts, MD;  Location: [place];  Service: Open Heart Surgery;; . Cardioversion N/A 10/20/2015   Procedure: CARDIOVERSION;  Surgeon: Pixie Casino, MD;  Location: [facility] ENDOSCOPY;  Service: Cardiovascular;  Laterality: N/A; HPI: Mr. Weisenberg is an 82 yo white male with chronic diastolic CHF, ACDF [id number] (pt unable to recall date), cervical spinal stenosis, coronary artery disease , recurrent SVT, pulmonary hypertension, type 2 diabetes mellitus, hyperlipidemia, and chronic back pain who recently underwent minimally invasive Mitral Valve Repair 10/04/15 presented with SOB and LE edema 10/17/15, also increased INR and inability to ambulate at home.CXR 3/22 stable small to moderate right pleural effusion with nonspecific right lung base patchy opacity, favor atelectasis. RN initiated order after observing difficulty with pills.  No Data Recorded Assessment / Plan / Recommendation CHL IP CLINICAL IMPRESSIONS 10/30/2015 Therapy Diagnosis Mild pharyngeal phase dysphagia;Moderate pharyngeal phase dysphagia Clinical Impression MBS complete. Pt exhibited mild-mod pharyngeal phase dysphagia characterized by anatomical etiology (ACDF C4-5 and cervical spine stenosis) and sensory deficits. Delayed swallow initiation to the vallecula resulted in silent penetration before the swallow above the level of the vocal cords. SLP observed bony growth which significantly reduced diameter of pharynx.  Decreased patency of pharynx culminated in reduced epiglottic inversion and silent penetration during the swallow above the level of the vocal cords. Pt instructed to utilize chin tuck during intake of thin liquids to reduce airway compromise, however unsuccessful in prevention of penetration. Reduced epiglottic inversion and decreased base of tongue retraction resulted in vallecular residue, which then led to silent penetration after the swallow. Due to anatomical presentation during this study, suspect chronic dysphagia due to reduced pharyngeal patency. Pt has never had PNA and likely able to compensate for impairments at home, however recommend Dysphagia 3 (mehcanical soft) and nectar thick liquids during admission due to deconditioning. Pt educated re: results of MBS, compensatory strategies (swallow x2 after every bite/sip, small bites/sips, follow solids with liquids, clear throat intermittently) and diet recommendation. Impact on safety and function Moderate aspiration risk   CHL IP TREATMENT RECOMMENDATION 10/30/2015 Treatment Recommendations Therapy as outlined in treatment plan below   Prognosis 10/30/2015 Prognosis for Safe Diet Advancement Fair Barriers to Reach Goals Severity of deficits Barriers/Prognosis Comment -- CHL IP DIET RECOMMENDATION 10/30/2015 SLP  Diet Recommendations Dysphagia 3 (Mech soft) solids;Nectar thick liquid Liquid Administration via Cup;No straw Medication Administration Crushed with puree Compensations Minimize environmental distractions;Slow rate;Small sips/bites;Multiple dry swallows after each bite/sip;Clear throat intermittently;Follow solids with liquid Postural Changes Seated upright at 90 degrees   CHL IP OTHER RECOMMENDATIONS 10/30/2015 Recommended Consults -- Oral Care Recommendations Oral care BID Other Recommendations Order thickener from pharmacy;Prohibited food (jello, ice cream, thin soups);Remove water pitcher   CHL IP FOLLOW UP RECOMMENDATIONS 10/30/2015 Follow up  Recommendations Home health SLP   CHL IP FREQUENCY AND DURATION 10/30/2015 Speech Therapy Frequency (ACUTE ONLY) min 2x/week Treatment Duration 2 weeks      CHL IP ORAL PHASE 10/30/2015 Oral Phase WFL Oral - Pudding Teaspoon -- Oral - Pudding Cup -- Oral - Honey Teaspoon -- Oral - Honey Cup -- Oral - Nectar Teaspoon -- Oral - Nectar Cup -- Oral - Nectar Straw -- Oral - Thin Teaspoon -- Oral - Thin Cup -- Oral - Thin Straw -- Oral - Puree -- Oral - Mech Soft -- Oral - Regular -- Oral - Multi-Consistency -- Oral - Pill -- Oral Phase - Comment --  CHL IP PHARYNGEAL PHASE 10/30/2015 Pharyngeal Phase Impaired Pharyngeal- Pudding Teaspoon -- Pharyngeal -- Pharyngeal- Pudding Cup -- Pharyngeal -- Pharyngeal- Honey Teaspoon -- Pharyngeal -- Pharyngeal- Honey Cup -- Pharyngeal -- Pharyngeal- Nectar Teaspoon -- Pharyngeal -- Pharyngeal- Nectar Cup Delayed swallow initiation-vallecula;Pharyngeal residue - valleculae;Reduced tongue base retraction;Penetration/Aspiration during swallow;Reduced epiglottic inversion;Reduced airway/laryngeal closure;Penetration/Aspiration before swallow Pharyngeal Material enters airway, remains ABOVE vocal cords and not ejected out Pharyngeal- Nectar Straw -- Pharyngeal -- Pharyngeal- Thin Teaspoon Penetration/Aspiration during swallow;Penetration/Apiration after swallow;Delayed swallow initiation-vallecula;Reduced epiglottic inversion;Reduced airway/laryngeal closure;Reduced tongue base retraction;Pharyngeal residue - valleculae;Compensatory strategies attempted (with notebox) Pharyngeal Material enters airway, remains ABOVE vocal cords and not ejected out Pharyngeal- Thin Cup -- Pharyngeal -- Pharyngeal- Thin Straw -- Pharyngeal -- Pharyngeal- Puree -- Pharyngeal -- Pharyngeal- Mechanical Soft -- Pharyngeal -- Pharyngeal- Regular Delayed swallow initiation-vallecula;Pharyngeal residue - valleculae Pharyngeal -- Pharyngeal- Multi-consistency -- Pharyngeal -- Pharyngeal- Pill -- Pharyngeal --  Pharyngeal Comment --  CHL IP CERVICAL ESOPHAGEAL PHASE 10/30/2015 Cervical Esophageal Phase Impaired Pudding Teaspoon -- Pudding Cup -- Honey Teaspoon -- Honey Cup -- Nectar Teaspoon -- Nectar Cup -- Nectar Straw -- Thin Teaspoon WFL Thin Cup -- Thin Straw -- Puree -- Mechanical Soft -- Regular -- Multi-consistency -- Pill -- Cervical Esophageal Comment -- No flowsheet data found. [place] Siren 10/30/2015, 1:36 PM Orbie Pyo Colvin Caroli.Ed CCC-SLP Pager [phone]               ASSESSMENT AND PLAN  1. Acute on chronic diastolic heart failure: Diuretics on hold due to elevated Scr which improved minimally to 1.92 from 1.98 yesterday.  I&O negative 2.3L with 24lb weight loss. Uncertain about I/O accuracy and weight. BP soft low. Likely resume low dose diuretics.   2. Atrial fibrillation: Currently in sinus rhythm.  Amiodarone 200mg , warfarin. Continue metoprolol 12.5 mg bid.  3. S/p MV repair.  4. Dysphagia: Recommended Nectar-thick liquid  5. Chronic subdural hematoma on head CT on 3/15.  LOS: 14 days   Dipso: Diuretics on hold. Will get start BEMT. MD to review discharge meds. Will get TCM appointment.   Signed, Leanor Kail PA-C Pager [phone]  Personally seen and examined. Agree with above. Will resume lasix 20mg  PO QD. Decrease KCL to 18meq QD. OK for DC. Discussed with TCTS team.   Candee Furbish, MD

## 2015-10-31 NOTE — Progress Notes (Signed)
CSW attempted to get in contact with facility representative Frederic Jericho to inform that patient is medically stable and ready for discharge on today. However, received no answer. CSW left voice message at 9:17am. CSW will continue to follow.  Lucius Conn, [place] Worker [facility] Ph: [phone]

## 2015-10-31 NOTE — Progress Notes (Addendum)
Speech Language Pathology Treatment: Dysphagia  Patient Details Name: [name] MRN: [id number] DOB: [birth year] Today's Date: 10/31/2015 Time: 1340-1400 SLP Time Calculation (min) (ACUTE ONLY): 20 min  Assessment / Plan / Recommendation Clinical Impression  Pt was seen for education regarding MBS results and recommendations, thickening liquids to nectar thick consistency, and safe swallow precautions. Pt was given written information regarding strategies to increase swallow safety, including not using straws, small bites and sips alternating solids and liquids, occasional throat clear, and crushed meds. These strategies were identified based on pt presentation on MBS 10/30/15, including delayed swallow reflex, penetration of thin liquids, and post-swallow residue. Wife was provided with written information on thickening liquids, including use of naturally occuring nectar thick liquids (tomato juice, buttermilk, fruit nectars), and benefits of smoothies (which can be made thicker using peanut butter, yogurt, pumpkin puree, oatmeal). Pt continues to exhibit mild-moderate dysarthric speech, indicating oral motor weakness. This further increases risk of dysphagia and aspiration. Continued speech therapy intervention is recommended at the next venue for additional treatment of speech and swallowing deficits.   HPI HPI: Mr. Pfaff is an 82 yo white male with chronic diastolic CHF, ACDF [id number] (pt unable to recall date), cervical spinal stenosis, coronary artery disease , recurrent SVT, pulmonary hypertension, type 2 diabetes mellitus, hyperlipidemia, and chronic back pain who recently underwent minimally invasive Mitral Valve Repair 10/04/15 presented with SOB and LE edema 10/17/15, also increased INR and inability to ambulate at home.CXR 3/22 stable small to moderate right pleural effusion with nonspecific right lung base patchy opacity, favor atelectasis. RN initiated order after observing difficulty with  pills. MBS completed 10/30/15, with recommendation for soft solids and nectar thick liquids via cup      SLP Plan  Continue with current plan of care (recommend continued dysphagia/dysarthria intervention at next venue)     Recommendations  Diet recommendations: Dysphagia 3 (mechanical soft);Nectar-thick liquid Liquids provided via: No straw;Cup Medication Administration: Crushed with puree Compensations: Minimize environmental distractions;Slow rate;Small sips/bites;Multiple dry swallows after each bite/sip;Clear throat intermittently;Follow solids with liquid Postural Changes and/or Swallow Maneuvers: Seated upright 90 degrees;Upright 30-60 min after meal             Oral Care Recommendations: Oral care BID Follow up Recommendations: Skilled Nursing facility Plan: Continue with current plan of care (recommend continued dysphagia/dysarthria intervention at next venue)      Shonna Chock 10/31/2015, 2:07 PM  [facility], [place] [phone]

## 2015-10-31 NOTE — Clinical Social Work Placement (Signed)
   CLINICAL SOCIAL WORK PLACEMENT  NOTE  Date:  10/31/2015  Patient Details  Name: [name] MRN: [id number] Date of Birth: 06-09-1929  Clinical Social Work is seeking post-discharge placement for this patient at the [place] level of care (*CSW will initial, date and re-position this form in  chart as items are completed):  Yes   Patient/family provided with [place] Work Department's list of facilities offering this level of care within the geographic area requested by the patient (or if unable, by the patient's family).  Yes   Patient/family informed of their freedom to choose among providers that offer the needed level of care, that participate in Medicare, Medicaid or managed care program needed by the patient, have an available bed and are willing to accept the patient.  Yes   Patient/family informed of [facility]'s ownership interest in [facility] and [facility], as well as of the fact that they are under no obligation to receive care at these facilities.  PASRR submitted to EDS on 10/26/15     PASRR number received on 10/26/15     Existing PASRR number confirmed on       FL2 transmitted to all facilities in geographic area requested by pt/family on 10/26/15     FL2 transmitted to all facilities within larger geographic area on       Patient informed that his/her managed care company has contracts with or will negotiate with certain facilities, including the following:        Yes   Patient/family informed of bed offers received.  Patient chooses bed at [facility]     Physician recommends and patient chooses bed at      Patient to be transferred to [facility] on  .  Patient to be transferred to facility by  Corey Harold)     Patient family notified on 10/31/15 of transfer.  Name of family member notified:  Patient aware.      PHYSICIAN Please prepare priority discharge summary, including medications      Additional Comment:    _______________________________________________ Raymondo Band, LCSW 10/31/2015, 11:19 AM

## 2015-10-31 NOTE — Progress Notes (Signed)
Transportation has been arranged via [facility] for patient to arrive at [facility]. Discharge Summary sent via [employer]. CSW to inform RN and patient. No further needs were reported at this time. CSW to sign off.   Please consult if further CSW needs arise.   Lucius Conn, [place] Worker [facility] Ph: [phone]

## 2015-10-31 NOTE — Telephone Encounter (Signed)
Pt discharged today, TCM for 4/29

## 2015-10-31 NOTE — Progress Notes (Signed)
ANTICOAGULATION CONSULT NOTE   Pharmacy Consult for Warfarin Indication: atrial fibrillation  No Known Allergies  Labs:  Recent Labs  10/29/15 0310 10/30/15 0406 10/31/15 0425  HGB 9.7* 9.7* 9.7*  HCT 31.9* 32.0* 32.6*  PLT 213 227 201  LABPROT 28.7* 33.0* 29.1*  INR 2.75* 3.31* 2.81*  CREATININE  --  1.92*  --     Estimated Creatinine Clearance: 32.2 mL/min (by C-G formula based on Cr of 1.92).  Assessment: 82 yo with Afib on Warfarin PTA- Warfarin Dose: 2 mg daily PTA. Admitted with supra-therapeutic INR and received vitamin K 3/15.  Restarted Warfarin 3/18 with INR goal of 2-2.5 per cards.  INR today at 2.81 DDI: Amiodarone  Chronic subdural hematoma on head CT on 3/15.  Per Cards MD continue to follow subdural hematoma closely with slow titration of warfarin.  Goal of Therapy:  INR 2-2.5 Monitor platelets by anticoagulation protocol: Yes   Plan:   -Coumadin 1 mg po x 1 today -Daily INR and CBC -Monitor for s/sx of bleeding  Thank you Anette Guarneri, PharmD [phone]  10/31/2015 11:29 AM

## 2015-10-31 NOTE — Progress Notes (Signed)
Pt discharged to [facility] via ambulance. IV and telemetry previously removed by day shift RN. Discharge paperwork given to ambulance.

## 2015-11-02 ENCOUNTER — Encounter: Payer: Medicare Other | Admitting: Physician Assistant

## 2015-11-02 NOTE — Telephone Encounter (Signed)
Leave message for pt to call back 03/29

## 2015-11-02 NOTE — Telephone Encounter (Signed)
Attempted call to mobile # - went straight to VM  LM on home # to call back  TOC call attempt #1 on 3/9, call attempt #2 on 3/30 - encounter closed.

## 2015-11-03 DIAGNOSIS — I509 Heart failure, unspecified: Secondary | ICD-10-CM | POA: Diagnosis not present

## 2015-11-06 ENCOUNTER — Encounter: Payer: Medicare Other | Admitting: Pharmacist Clinician (PhC)/ Clinical Pharmacy Specialist

## 2015-11-06 ENCOUNTER — Ambulatory Visit (INDEPENDENT_AMBULATORY_CARE_PROVIDER_SITE_OTHER): Payer: Medicare Other | Admitting: Physician Assistant

## 2015-11-06 ENCOUNTER — Ambulatory Visit (INDEPENDENT_AMBULATORY_CARE_PROVIDER_SITE_OTHER): Payer: Medicare Other | Admitting: Pharmacist Clinician (PhC)/ Clinical Pharmacy Specialist

## 2015-11-06 ENCOUNTER — Encounter: Payer: Self-pay | Admitting: Physician Assistant

## 2015-11-06 VITALS — BP 90/58 | HR 88 | Ht 70.0 in | Wt 193.0 lb

## 2015-11-06 DIAGNOSIS — Z9889 Other specified postprocedural states: Secondary | ICD-10-CM

## 2015-11-06 DIAGNOSIS — Z79899 Other long term (current) drug therapy: Secondary | ICD-10-CM

## 2015-11-06 DIAGNOSIS — I5032 Chronic diastolic (congestive) heart failure: Secondary | ICD-10-CM

## 2015-11-06 DIAGNOSIS — Z7901 Long term (current) use of anticoagulants: Secondary | ICD-10-CM

## 2015-11-06 LAB — POCT INR: INR: 1.9

## 2015-11-06 NOTE — Progress Notes (Signed)
Cardiology Office Note   Date:  11/06/2015   ID:  [name], DOB 03/05/1929, MRN [id number]  PCP:  [name], MD  Cardiologist:  Dr Mardene Celeste, PA-C   Chief Complaint  Patient presents with  . Follow-up    no chest pain, no new swelling, no cramping, shortness of breath, no dizziness or lightheadedness    History of Present Illness: [name] is a 82 y.o. male with a history of D-CHF, HTN, PAF on amio and coumadin, DES LAD/RCA 11/2014, chronic SDH, SVT w/ DCCV, DM, HLD.  In hospital 03/01-03/10 for minimally invasive MV repair.  In hosp 03/14-03/28, admission for CHF, d/c to inpatient rehab facility, [facility].  [name] presents for post-hospital followup.  [name] has several complaints about rehabilitation. He is unhappy that he is getting a thickener added to beverages. He is unhappy with the food. He states he is having problems swallowing it and is vomiting it up on a regular basis. He is concerned about this. Additionally, he is concerned because a dressing placed in the hospital prior to discharge after his valve surgery, has not been changed. He also has some stitches from his valve surgery in early March that have not been removed.  He does not think he has been getting weighed every day. However, he feels that he has not gained weight because he doesn't like the food. He still has some lower extremity edema, and it comes up very quickly in the daytime. He is not sure if he has it when he wakes in the morning. He has not had fevers or chills. He does not think that any of the sites are getting infected.  He has been working with rehabilitation and is getting some stronger but is still extremely weak.   Past Medical History  Diagnosis Date  . Coronary artery disease     a. 11/2014 NSTEMI: DESx 2 placed to LAD and RCA   . Mitral valve prolapse   . Diabetes mellitus, type II ([place])     a. HgA1c 6.8 in 03/2015  .  Mitral regurgitation     a. severe wtih flail leaflet by 2D ECHO (06/05/15)  . SVT (supraventricular tachycardia) ([facility])     a. recurrent, usually responsive to vagal manuevers  . HTN (hypertension)   . Pulmonary HTN ([place])   . Lower extremity edema     a. chronic  . HLD (hyperlipidemia)   . Hypertension   . OA (osteoarthritis)   . ED (erectile dysfunction)   . Chronic fatigue   . Gout   . Spinal stenosis   . Prostate cancer ([place])   . Severe mitral regurgitation 11/17/2014    Mitral valve prolapse with flail segment of anterior leaflet   . Chronic diastolic (congestive) heart failure ([place])   . Hemangioma of liver 08/02/2015    12 mm enhancing lesion seen on CT - suspicious for benign hemangioma but not diagnostic - MRI recommended  . Dysrhythmia   . Heart murmur   . Shortness of breath dyspnea   . Kidney stones   . Physical deconditioning   . S/P minimally invasive mitral valve repair 10/04/2015    Complex valvuloplasty including artificial Gore-tex neochord placement x6 with plication closure of cleft between P2 and P3 and 30 mm Sorin Memo 3D ring annuloplasty via right mini thoracotomy approach   . Hypertensive left ventricular hypertrophy with heart failure ([place]) 10/04/2015  . Hypertensive cardiomyopathy ([place])  Past Surgical History  Procedure Laterality Date  . Cardiac surgery      2 cardiac stents  . Joint replacement      shoulder  . Back surgery x 2    . Rt rotator cuff repair    . Cervical spine surgery    . Back surgery    . Left heart catheterization with coronary angiogram N/A 11/16/2014    Procedure: LEFT HEART CATHETERIZATION WITH CORONARY ANGIOGRAM;  Surgeon: Troy Sine, MD;  Location: [facility] CATH LAB;  Service: Cardiovascular;  Laterality: N/A;  . Tee without cardioversion N/A 06/22/2015    Procedure: TRANSESOPHAGEAL ECHOCARDIOGRAM (TEE);  Surgeon: Skeet Latch, MD;  Location: [place];  Service: Cardiovascular;  Laterality: N/A;  . Cardiac  catheterization N/A 08/08/2015    Procedure: Right/Left Heart Cath and Coronary Angiography;  Surgeon: Sherren Mocha, MD;  Location: [place] CV LAB;  Service: Cardiovascular;  Laterality: N/A;  . Eye surgery    . Mitral valve repair Right 10/04/2015    Procedure: MINIMALLY INVASIVE MITRAL VALVE REPAIR (MVR);  Surgeon: Rexene Alberts, MD;  Location: [place];  Service: Open Heart Surgery;  Laterality: Right;  . Tee without cardioversion N/A 10/04/2015    Procedure: TRANSESOPHAGEAL ECHOCARDIOGRAM (TEE);  Surgeon: Rexene Alberts, MD;  Location: [place];  Service: Open Heart Surgery;  Laterality: N/A;  . Wound exploration N/A 10/05/2015    Procedure: Sternal Incision;  Surgeon: Rexene Alberts, MD;  Location: [place];  Service: Open Heart Surgery;  Laterality: N/A;  . Intraoperative transesophageal echocardiogram  10/05/2015    Procedure: INTRAOPERATIVE TRANSESOPHAGEAL ECHOCARDIOGRAM;  Surgeon: Rexene Alberts, MD;  Location: [place];  Service: Open Heart Surgery;;  . Cardioversion N/A 10/20/2015    Procedure: CARDIOVERSION;  Surgeon: Pixie Casino, MD;  Location: [facility] ENDOSCOPY;  Service: Cardiovascular;  Laterality: N/A;    Current Outpatient Prescriptions  Medication Sig Dispense Refill  . acetaminophen (TYLENOL) 500 MG tablet Take 2 tablets (1,000 mg total) by mouth every 6 (six) hours as needed for mild pain or fever. 30 tablet 0  . amiodarone (PACERONE) 200 MG tablet Take 1 tablet (200 mg total) by mouth daily. 30 tablet 1  . aspirin EC 81 MG tablet Take 81 mg by mouth daily.    . colchicine 0.6 MG tablet Take 1 tablet (0.6 mg total) by mouth daily. 14 tablet 0  . feeding supplement, ENSURE ENLIVE, (ENSURE ENLIVE) LIQD Take 237 mLs by mouth 2 (two) times daily. 237 mL 12  . finasteride (PROSCAR) 5 MG tablet TAKE ONE (1) TABLET EACH DAY 30 tablet 3  . fluticasone (FLONASE) 50 MCG/ACT nasal spray Place 1 spray into both nostrils daily as needed for allergies or rhinitis.    . furosemide (LASIX) 40 MG  tablet Take 0.5 tablets (20 mg total) by mouth daily. 30 tablet 1  . Menthol-Methyl Salicylate (MUSCLE RUB) 10-15 % CREA Apply 1 application topically 2 (two) times daily as needed for muscle pain.     . metoprolol tartrate (LOPRESSOR) 25 MG tablet Take 0.5 tablets (12.5 mg total) by mouth 2 (two) times daily. 60 tablet 1  . potassium chloride SA (K-DUR,KLOR-CON) 20 MEQ tablet Take 1 tablet (20 mEq total) by mouth daily. 30 tablet 1  . pravastatin (PRAVACHOL) 40 MG tablet Take 1 tablet (40 mg total) by mouth daily at 6 PM. (Patient taking differently: Take 40 mg by mouth daily. ) 30 tablet 11  . tamsulosin (FLOMAX) 0.4 MG CAPS capsule Take 1 capsule (  0.4 mg total) by mouth daily. 90 capsule 1  . traMADol (ULTRAM) 50 MG tablet Take 1 tablet (50 mg total) by mouth every 6 (six) hours as needed for moderate pain. 20 tablet 0  . warfarin (COUMADIN) 2 MG tablet Take 0.5 tablets (1 mg total) by mouth daily at 6 PM. Or as directed 30 tablet 1   No current facility-administered medications for this visit.    Allergies:   Review of patient's allergies indicates no known allergies.    Social History:  The patient  reports that he has quit smoking. He has never used smokeless tobacco. He reports that he does not drink alcohol or use illicit drugs.   Family History:  The patient's family history includes Heart attack in his brother and mother; Heart failure (age of onset: 73) in his mother.    ROS:  Please see the history of present illness. All other systems are reviewed and negative.    PHYSICAL EXAM: VS:  BP 90/58 mmHg  Pulse 88  Ht 5\' 10"  (1.778 m)  Wt 193 lb (87.544 kg)  BMI 27.69 kg/m2 , BMI Body mass index is 27.69 kg/(m^2). GEN: Well nourished, well developed, male in no acute distress HEENT: normal for age  Neck: no JVD, no carotid bruit, no masses Cardiac: RRR; 2/6 murmur, no rubs, or gallops Respiratory:  Decreased breath sounds bases, right greater than left, normal work of  breathing, poor inspiratory effort. GI: soft, nontender, nondistended, + BS MS: no deformity or atrophy; 1+ edema; distal pulses are 2+ in all 4 extremities  Skin: warm and dry, no rash; 3 incisions with sutures in place, right groin dressing removed and there is an open area that does not have signs of infection and appears to be healing slowly Neuro:  Strength and sensation are intact Psych: euthymic mood, full affect   EKG:  EKG is not ordered today.  Recent Labs: 11/14/2014: TSH 0.676 10/06/2015: Magnesium 2.4 10/20/2015: ALT 71* 10/26/2015: B Natriuretic Peptide 685.7* 10/30/2015: BUN 37*; Creatinine, Ser 1.92*; Potassium 3.9; Sodium 139 10/31/2015: Hemoglobin 9.7*; Platelets 201    Lipid Panel    Component Value Date/Time   CHOL 158 06/05/2015 0309   TRIG 235* 06/05/2015 0309   HDL 31* 06/05/2015 0309   CHOLHDL 5.1 06/05/2015 0309   VLDL 47* 06/05/2015 0309   LDLCALC 80 06/05/2015 0309     Wt Readings from Last 3 Encounters:  11/06/15 193 lb (87.544 kg)  10/31/15 196 lb 3.2 oz (88.996 kg)  10/12/15 227 lb 6.4 oz (103.148 kg)     Other studies Reviewed: Additional studies/ records that were reviewed today include: Hospital records and testing.  ASSESSMENT AND PLAN:  1. Chronic diastolic CHF: He is on a low dose of Lasix. His weight today is 3 pounds down from his previous weight. He admits that his by mouth intake is poor. I have encouraged his wife to talk to the facility about his by mouth intake. He is not sure if he has edema when he wakes up in the morning or if he is just getting up during the day. If all of this is merely dependent edema, his weight may well be at baseline and no additional medication changes are indicated. We will check a BMET today.  2. S/p minimally invasive mitral valve repair: He is not having any signs or symptoms of infection from the right groin wound which had its dressing removed and was redressed with antibiotic ointment. Dressing changes  every other  day were ordered. He is to keep his follow-up appointment with surgery later this month. The 3 areas of stitches were removed. There are no signs of infection in these areas and there was no significant bleeding out of sites. They were covered with triple antibiotic ointment and a dry dressing. These dressings can be removed next time he showers.  3 hypotension: His blood pressures on the low side of normal but he is currently asymptomatic with this. Therefore, at this time I will not make any medication changes. Continue to follow this and make medication adjustments as needed. He is on a very low dose of Lasix and metoprolol. He is not on any other rate or blood pressure lowering medications.   Current medicines are reviewed at length with the patient today.  The patient does not have concerns regarding medicines.  The following changes have been made:  no change, med changes depending on results of the BMET.  Labs/ tests ordered today include:   BMET   Disposition:   FU with Dr. Percival Spanish  Signed, Rosaria Ferries, PA-C  11/06/2015 3:26 PM    [place] Phone: [phone]; Fax: [phone]  This note was written with the assistance of speech recognition software. Please excuse any transcriptional errors.

## 2015-11-06 NOTE — Patient Instructions (Signed)
Your physician recommends that you return for lab work TODAY.  It is okay to remove the bandaids on chest next time he is showered.  Rhonda Barrett, PA-C, recommends that you schedule a follow-up appointment in 4-6 weeks with Dr Percival Spanish.  If you need a refill on your cardiac medications before your next appointment, please call your pharmacy.

## 2015-11-07 LAB — BASIC METABOLIC PANEL
BUN: 18 mg/dL (ref 7–25)
CHLORIDE: 106 mmol/L (ref 98–110)
CO2: 19 mmol/L — ABNORMAL LOW (ref 20–31)
CREATININE: 1.17 mg/dL — AB (ref 0.70–1.11)
Calcium: 8.2 mg/dL — ABNORMAL LOW (ref 8.6–10.3)
Glucose, Bld: 123 mg/dL — ABNORMAL HIGH (ref 65–99)
POTASSIUM: 4.3 mmol/L (ref 3.5–5.3)
Sodium: 138 mmol/L (ref 135–146)

## 2015-11-09 ENCOUNTER — Encounter: Payer: Self-pay | Admitting: *Deleted

## 2015-11-09 ENCOUNTER — Other Ambulatory Visit: Payer: Self-pay | Admitting: *Deleted

## 2015-11-09 DIAGNOSIS — Z9889 Other specified postprocedural states: Secondary | ICD-10-CM

## 2015-11-09 NOTE — Patient Outreach (Signed)
[name]) Care Management  [facility] Social Work  11/09/2015  [name] 1929-06-21 [id number]  Subjective:  Patient is a 82 year old male.  Objective:   Encounter Medications:  Outpatient Encounter Prescriptions as of 11/09/2015  Medication Sig  . acetaminophen (TYLENOL) 500 MG tablet Take 2 tablets (1,000 mg total) by mouth every 6 (six) hours as needed for mild pain or fever.  Marland Kitchen amiodarone (PACERONE) 200 MG tablet Take 1 tablet (200 mg total) by mouth daily.  Marland Kitchen aspirin EC 81 MG tablet Take 81 mg by mouth daily.  . colchicine 0.6 MG tablet Take 1 tablet (0.6 mg total) by mouth daily.  . feeding supplement, ENSURE ENLIVE, (ENSURE ENLIVE) LIQD Take 237 mLs by mouth 2 (two) times daily.  . finasteride (PROSCAR) 5 MG tablet TAKE ONE (1) TABLET EACH DAY  . fluticasone (FLONASE) 50 MCG/ACT nasal spray Place 1 spray into both nostrils daily as needed for allergies or rhinitis.  . furosemide (LASIX) 40 MG tablet Take 0.5 tablets (20 mg total) by mouth daily.  . Menthol-Methyl Salicylate (MUSCLE RUB) 10-15 % CREA Apply 1 application topically 2 (two) times daily as needed for muscle pain.   . metoprolol tartrate (LOPRESSOR) 25 MG tablet Take 0.5 tablets (12.5 mg total) by mouth 2 (two) times daily.  . potassium chloride SA (K-DUR,KLOR-CON) 20 MEQ tablet Take 1 tablet (20 mEq total) by mouth daily.  . pravastatin (PRAVACHOL) 40 MG tablet Take 1 tablet (40 mg total) by mouth daily at 6 PM. (Patient taking differently: Take 40 mg by mouth daily. )  . tamsulosin (FLOMAX) 0.4 MG CAPS capsule Take 1 capsule (0.4 mg total) by mouth daily.  . traMADol (ULTRAM) 50 MG tablet Take 1 tablet (50 mg total) by mouth every 6 (six) hours as needed for moderate pain.  Marland Kitchen warfarin (COUMADIN) 2 MG tablet Take 0.5 tablets (1 mg total) by mouth daily at 6 PM. Or as directed   No facility-administered encounter medications on file as of 11/09/2015.    Functional Status:  In your present state of  health, do you have any difficulty performing the following activities: 11/09/2015 10/18/2015  Hearing? Tempie Donning  Vision? Y N  Difficulty concentrating or making decisions? N N  Walking or climbing stairs? Y Y  Dressing or bathing? N N  Doing errands, shopping? N N  Preparing Food and eating ? Y -  Using the Toilet? N -  In the past six months, have you accidently leaked urine? N -  Do you have problems with loss of bowel control? N -  Managing your Medications? N -  Managing your Finances? N -  Housekeeping or managing your Housekeeping? Y -    Fall/Depression Screening:  PHQ 2/9 Scores 11/09/2015 04/26/2014 04/26/2014 11/16/2013 10/11/2013  PHQ - 2 Score 0 0 2 0 0  PHQ- 9 Score - 0 7 - -    Assessment: This social worker visited patient in his room at [facility].  Patient was sitting in a wheelchair when this social worker arrived.  Per patient, his discharge date was set for tomorrow 11/10/15.  This social worker spoke with the discharge planner, [place] Henrene Pastor  who confirmed that there was no discharge date set for patient.  Clarene Duke, physical therapist consulted stating that patient would have at least another week in treatment.  Patient is weigh bearing, however is slow to progress.  His endurance is poor and can walk only a short distance.  Patient's wife was present during the last half of the visit and supports patient remaining in the skilled nursing facility as long as needed.  She is the main caretaker to both patient and her brother and he is very ill and will be transferring to a hospice home.  Patient agreed to actively participate in therapy to maximize the benefits of treatment  Plan:  This Education officer, museum will follow up with patient and spouse in approximately one week to follow up on discharge plan.    [facility] CM Care Plan Problem One        Most Recent Value   Care Plan Problem One  patient currenty in a skilled nursing facility   Role Documenting the Problem One  Clinical  Social Worker   Care Plan for Problem One  Active   [facility] CM Short Term Goal #1 (0-30 days)  patient will actively participate in physical and occupaitonal therapy for the next 2 weeks   [facility] CM Short Term Goal #1 Start Date  11/09/15   Interventions for Short Term Goal #1  social worker encouraged patient to participate fully in treatment inorder to maximize the benefits   [facility] CM Short Term Goal #2 (0-30 days)  This Education officer, museum and patient to [employer]  with the discharge planning team to ensure w safe and stable discharge for the next 2 weeks   [facility] CM Short Term Goal #2 Start Date  11/09/15   Interventions for Short Term Goal #2  tThis social worker collaborated with the discharge planner as well as the physical therapist to discuss current treatment and tentative discharge plan.  Information received discussed with patient     [name] [facility] Care Management [phone]

## 2015-11-20 DIAGNOSIS — R1312 Dysphagia, oropharyngeal phase: Secondary | ICD-10-CM | POA: Diagnosis not present

## 2015-11-20 DIAGNOSIS — I2510 Atherosclerotic heart disease of native coronary artery without angina pectoris: Secondary | ICD-10-CM | POA: Diagnosis not present

## 2015-11-20 DIAGNOSIS — N183 Chronic kidney disease, stage 3 (moderate): Secondary | ICD-10-CM | POA: Diagnosis not present

## 2015-11-20 DIAGNOSIS — I482 Chronic atrial fibrillation: Secondary | ICD-10-CM | POA: Diagnosis not present

## 2015-11-20 DIAGNOSIS — I272 Other secondary pulmonary hypertension: Secondary | ICD-10-CM | POA: Diagnosis not present

## 2015-11-20 DIAGNOSIS — D649 Anemia, unspecified: Secondary | ICD-10-CM | POA: Diagnosis not present

## 2015-11-20 DIAGNOSIS — I5032 Chronic diastolic (congestive) heart failure: Secondary | ICD-10-CM | POA: Diagnosis not present

## 2015-11-20 DIAGNOSIS — E1122 Type 2 diabetes mellitus with diabetic chronic kidney disease: Secondary | ICD-10-CM | POA: Diagnosis not present

## 2015-11-20 DIAGNOSIS — M1991 Primary osteoarthritis, unspecified site: Secondary | ICD-10-CM | POA: Diagnosis not present

## 2015-11-20 DIAGNOSIS — M109 Gout, unspecified: Secondary | ICD-10-CM | POA: Diagnosis not present

## 2015-11-20 DIAGNOSIS — E785 Hyperlipidemia, unspecified: Secondary | ICD-10-CM | POA: Diagnosis not present

## 2015-11-20 DIAGNOSIS — I130 Hypertensive heart and chronic kidney disease with heart failure and stage 1 through stage 4 chronic kidney disease, or unspecified chronic kidney disease: Secondary | ICD-10-CM | POA: Diagnosis not present

## 2015-11-20 DIAGNOSIS — Z48812 Encounter for surgical aftercare following surgery on the circulatory system: Secondary | ICD-10-CM | POA: Diagnosis not present

## 2015-11-21 ENCOUNTER — Ambulatory Visit (INDEPENDENT_AMBULATORY_CARE_PROVIDER_SITE_OTHER): Payer: Self-pay | Admitting: Thoracic Surgery (Cardiothoracic Vascular Surgery)

## 2015-11-21 ENCOUNTER — Ambulatory Visit
Admission: RE | Admit: 2015-11-21 | Discharge: 2015-11-21 | Disposition: A | Discharge status: Home / Self Care | Payer: Medicare Other | Source: Ambulatory Visit | Attending: Thoracic Surgery (Cardiothoracic Vascular Surgery) | Admitting: Thoracic Surgery (Cardiothoracic Vascular Surgery)

## 2015-11-21 ENCOUNTER — Encounter: Payer: Self-pay | Admitting: Thoracic Surgery (Cardiothoracic Vascular Surgery)

## 2015-11-21 VITALS — BP 98/62 | HR 60 | Resp 20 | Ht 70.0 in | Wt 242.0 lb

## 2015-11-21 DIAGNOSIS — Z9889 Other specified postprocedural states: Secondary | ICD-10-CM

## 2015-11-21 DIAGNOSIS — J90 Pleural effusion, not elsewhere classified: Secondary | ICD-10-CM | POA: Diagnosis not present

## 2015-11-21 DIAGNOSIS — I340 Nonrheumatic mitral (valve) insufficiency: Secondary | ICD-10-CM

## 2015-11-21 DIAGNOSIS — I471 Supraventricular tachycardia: Secondary | ICD-10-CM

## 2015-11-21 DIAGNOSIS — I110 Hypertensive heart disease with heart failure: Secondary | ICD-10-CM

## 2015-11-21 DIAGNOSIS — I2510 Atherosclerotic heart disease of native coronary artery without angina pectoris: Secondary | ICD-10-CM

## 2015-11-21 DIAGNOSIS — I5032 Chronic diastolic (congestive) heart failure: Secondary | ICD-10-CM

## 2015-11-21 NOTE — Progress Notes (Signed)
[address]       [place] [address]             [phone]     CARDIOTHORACIC SURGERY OFFICE NOTE  Referring Provider is [name], MD PCP is [name], MD   HPI:  Patient is an 82 year old male with complex medical history including mitral valve prolapse with severe mitral regurgitation, chronic diastolic congestive heart failure, coronary artery disease status post PCI and stenting, recurrent SVT, atrial fibrillation, pulmonary hypertension, type 2 diabetes mellitus, hyperlipidemia and chronic back pain who returns to the office today for follow-up status post minimally invasive mitral valve repair on 10/04/2015 for severe symptomatic primary mitral regurgitation.  The patient's immediate postoperative recovery during the initial 12 hours following surgery were complicated by the development of systolic anterior motion of the mitral valve with severe mitral regurgitation. This resolved completely with beta-blockade and volume administration. He developed postoperative atrial fibrillation and was started on amiodarone. He was anticoagulated using warfarin. He gradually recovered and was ultimately discharged home on the eighth postoperative day.  He was readmitted to the hospital 1 week later with confusion and acute exacerbation of congestive heart failure with shortness of breath and severe lower extremity edema.  He was supratherapeutic on warfarin with INR greater than 6. He remained hospitalized for nearly 2 weeks and recovered gradually with intravenous diuretic therapy. Follow-up echocardiogram performed 10/18/2015 revealed normal left ventricular systolic function with ejection fraction 55-60%. The mitral valve repair appeared intact with no mitral regurgitation and no evidence for systolic anterior motion of the mitral valve. Specifically, there was no dynamic left ventricular outflow tract obstruction.  He ultimately underwent DC cardioversion and  maintain sinus rhythm throughout the remainder of his hospitalization.  During this hospitalization he was noted to have some skin separation and skin edge necrosis involving his right groin incision. This was treated with dressing changes.  He eventually was discharged to a skilled nursing facility in [place] Since hospital discharge the patient was seen in follow-up by Rosaria Ferries at [facility] on 11/06/2015.  No significant changes in his medications were made.  Basic metabolic panel obtained at that time revealed normal renal function with creatinine down to 1.2.  The patient's INR was 1.9 at that time.  The patient returns to our office today for routine follow-up. He was just discharged home from the skilled nursing facility 3 days ago. He is accompanied to our office by his wife.They state that some type of arrangements for home health services have been made but they have not had any visits to date. It is unclear when his prothrombin time was last checked or if it was checked since 11/06/2015.  He patient states that he feels quite well. He states that his breathing is much better than it had been prior to surgery. He is not walking very much but he plans to start to walk more. He has had problems with chronic lower extremity edema. He is sleeping well. He denies pain He states that. He has not had palpitations or dizzy spells   Current Outpatient Prescriptions  Medication Sig Dispense Refill  . acetaminophen (TYLENOL) 500 MG tablet Take 2 tablets (1,000 mg total) by mouth every 6 (six) hours as needed for mild pain or fever. 30 tablet 0  . amiodarone (PACERONE) 200 MG tablet Take 1 tablet (200 mg total) by mouth daily. 30 tablet 1  . aspirin EC 81 MG tablet Take 81 mg by  mouth daily.    . colchicine 0.6 MG tablet Take 1 tablet (0.6 mg total) by mouth daily. 14 tablet 0  . feeding supplement, ENSURE ENLIVE, (ENSURE ENLIVE) LIQD Take 237 mLs by mouth 2 (two) times daily. 237 mL 12  .  finasteride (PROSCAR) 5 MG tablet TAKE ONE (1) TABLET EACH DAY 30 tablet 3  . fluticasone (FLONASE) 50 MCG/ACT nasal spray Place 1 spray into both nostrils daily as needed for allergies or rhinitis.    . furosemide (LASIX) 40 MG tablet Take 0.5 tablets (20 mg total) by mouth daily. (Patient taking differently: Take 20 mg by mouth daily. 60 mg per day total due to leg swelling) 30 tablet 1  . Menthol-Methyl Salicylate (MUSCLE RUB) 10-15 % CREA Apply 1 application topically 2 (two) times daily as needed for muscle pain.     . metoprolol tartrate (LOPRESSOR) 25 MG tablet Take 0.5 tablets (12.5 mg total) by mouth 2 (two) times daily. 60 tablet 1  . potassium chloride SA (K-DUR,KLOR-CON) 20 MEQ tablet Take 1 tablet (20 mEq total) by mouth daily. 30 tablet 1  . pravastatin (PRAVACHOL) 40 MG tablet Take 1 tablet (40 mg total) by mouth daily at 6 PM. (Patient taking differently: Take 40 mg by mouth daily. ) 30 tablet 11  . tamsulosin (FLOMAX) 0.4 MG CAPS capsule Take 1 capsule (0.4 mg total) by mouth daily. 90 capsule 1  . traMADol (ULTRAM) 50 MG tablet Take 1 tablet (50 mg total) by mouth every 6 (six) hours as needed for moderate pain. 20 tablet 0  . warfarin (COUMADIN) 2 MG tablet Take 0.5 tablets (1 mg total) by mouth daily at 6 PM. Or as directed 30 tablet 1   No current facility-administered medications for this visit.      Physical Exam:   BP 98/62 mmHg  Pulse 60  Resp 20  Ht 5\' 10"  (1.778 m)  Wt 242 lb (109.77 kg)  BMI 34.72 kg/m2  SpO2 95%  General:  Elderly but well appearing  Chest:   Clear to auscultation  CV:   Regular rate and rhythm without murmur  Incisions:  Clean and dry healing nicely, right groin incision looks much improved  Abdomen:  Soft and nontender  Extremities:  warm and well-perfused  Diagnostic Tests:  CHEST 2 VIEW  COMPARISON: PA and lateral chest x-ray of October 25, 2015  FINDINGS: There remains a small right pleural effusion. The  pulmonary interstitium at the right lung base has partially cleared. The left lung is clear. The cardiac silhouette remains enlarged. The pulmonary vascularity is less engorged. The mediastinum is normal in width. There are degenerative changes of both shoulders and AC joints.  IMPRESSION: Improved appearance of the chest. Persistent small right pleural effusion. Improving right basilar atelectasis or pneumonia. Decreased pulmonary interstitial edema. Stable cardiomegaly.   Electronically Signed  By: David Martinique M.D.  On: 11/21/2015 16:14   Impression:  Patient appears considerably improved and is now making steady progress, approximately 6 weeks out from his original surgery.  He does not have a murmur on exam and his most recent follow-up echocardiogram looked quite good with intact mitral valve repair and no signs of SAM or dynamic LV outflow tract obstruction.  He has normal left ventricular systolic function.  He does have severe chronic lower extremity edema.  I suspect that long-term management of his chronic diastolic heart failure may be somewhat challenging.  His surgical incisions are healing nicely, including the right groin incision which looks  much better than it did several weeks ago and has nearly completely healed.   Plan:  We have not made any changes to the patient's current medications today.  The patient and his wife have been encouraged to avoid excessive salt and watch the patient's fluid intake. I suggested that he should record his weight daily. Most importantly, I have encouraged the patient to increase his physical activity and specifically to make an effort to walk at least 2 or 3 times every day. We will contact the patient's home health agency to find out whether or not home health physical therapy has been arranged. He certainly would benefit from.  We will make certain that arrangements have been made for long-term follow-up of Coumadin anticoagulation  The patient has an appointment scheduled to see Dr. Percival Spanish early next month. We will plan to see him back in 2 months or sooner as needed.    Valentina Gu. Roxy Manns, MD 11/21/2015 4:52 PM

## 2015-11-21 NOTE — Patient Instructions (Signed)
Continue all previous medications without any changes at this time  You may continue to gradually increase your physical activity as tolerated.  Refrain from any heavy lifting or strenuous use of your arms and shoulders until at least 8 weeks from the time of your surgery, and avoid activities that cause increased pain in your chest on the side of your surgical incision.  Otherwise you may continue to increase activities without any particular limitations.  Increase the intensity and duration of physical activity gradually.  Make certain that your blood thinner level (INR) is being checked and results communicated with the Coumadin clinic  Keep your upcoming appointment with Dr. Percival Spanish on 12/08/2015

## 2015-11-22 ENCOUNTER — Encounter: Payer: Self-pay | Admitting: *Deleted

## 2015-11-22 ENCOUNTER — Other Ambulatory Visit: Payer: Self-pay | Admitting: *Deleted

## 2015-11-22 DIAGNOSIS — E785 Hyperlipidemia, unspecified: Secondary | ICD-10-CM | POA: Diagnosis not present

## 2015-11-22 DIAGNOSIS — I5032 Chronic diastolic (congestive) heart failure: Secondary | ICD-10-CM | POA: Diagnosis not present

## 2015-11-22 DIAGNOSIS — M109 Gout, unspecified: Secondary | ICD-10-CM | POA: Diagnosis not present

## 2015-11-22 DIAGNOSIS — I2510 Atherosclerotic heart disease of native coronary artery without angina pectoris: Secondary | ICD-10-CM | POA: Diagnosis not present

## 2015-11-22 DIAGNOSIS — Z48812 Encounter for surgical aftercare following surgery on the circulatory system: Secondary | ICD-10-CM | POA: Diagnosis not present

## 2015-11-22 DIAGNOSIS — I272 Other secondary pulmonary hypertension: Secondary | ICD-10-CM | POA: Diagnosis not present

## 2015-11-22 DIAGNOSIS — E1122 Type 2 diabetes mellitus with diabetic chronic kidney disease: Secondary | ICD-10-CM | POA: Diagnosis not present

## 2015-11-22 DIAGNOSIS — D649 Anemia, unspecified: Secondary | ICD-10-CM | POA: Diagnosis not present

## 2015-11-22 DIAGNOSIS — M1991 Primary osteoarthritis, unspecified site: Secondary | ICD-10-CM | POA: Diagnosis not present

## 2015-11-22 DIAGNOSIS — I482 Chronic atrial fibrillation: Secondary | ICD-10-CM | POA: Diagnosis not present

## 2015-11-22 DIAGNOSIS — R1312 Dysphagia, oropharyngeal phase: Secondary | ICD-10-CM | POA: Diagnosis not present

## 2015-11-22 DIAGNOSIS — I130 Hypertensive heart and chronic kidney disease with heart failure and stage 1 through stage 4 chronic kidney disease, or unspecified chronic kidney disease: Secondary | ICD-10-CM | POA: Diagnosis not present

## 2015-11-22 DIAGNOSIS — N183 Chronic kidney disease, stage 3 (moderate): Secondary | ICD-10-CM | POA: Diagnosis not present

## 2015-11-22 NOTE — Patient Outreach (Signed)
[facility]) Care Management  11/22/2015  DORN KARP [birth year] [id number]   This social worker arrived to [facility] on 11/21/15 to visit patient to find that he had discharged on 11/17/15.  This social worker spoke with discharge planner [place] who confirmed this, stating that it was unexpected but patient insisted that he needed to go  home on 11/17/15.  Patient did discharge home with his wife, home health was  arranged.  Plan:  RNCM to be notified for transition of care           This Social worker will contact patient to assess for social work needs.   [name] [facility] Care Management [phone]

## 2015-11-22 NOTE — Patient Outreach (Addendum)
This RN CM was notified by [facility] CSW pt discharged from [facility] 4/14.   Spoke with pt, HIPPA verified.   Pt reports feels rough, weak, HH PT came, working with him.   Pt reports f/u with Dr. Roxy Manns yesterday, to call Dr. Samella Parr office and make appointment.   Pt reports did not weigh today because HH PT came, weight has been staying at 199 lbs. For past 2-3 days.  Pt reports both legs are swollen, taking the fluid pill, elevating legs.  Pt reports he manages his own medications, match the discharge paper.   Discussed with pt [facility]  transition of care program, plan to  f/u with weekly phone calls 31 days post discharge, do a home visit.   As discussed, plan to f/u with pt 4/28- home visit.   Plan to inform Dr. Dennard Schaumann of [facility] involvement- send barrier letter by in basket.     Zara Chess.   [facility] Care Management  [phone]

## 2015-11-24 ENCOUNTER — Telehealth: Payer: Self-pay | Admitting: *Deleted

## 2015-11-24 DIAGNOSIS — I272 Other secondary pulmonary hypertension: Secondary | ICD-10-CM | POA: Diagnosis not present

## 2015-11-24 DIAGNOSIS — M1991 Primary osteoarthritis, unspecified site: Secondary | ICD-10-CM | POA: Diagnosis not present

## 2015-11-24 DIAGNOSIS — E785 Hyperlipidemia, unspecified: Secondary | ICD-10-CM | POA: Diagnosis not present

## 2015-11-24 DIAGNOSIS — I130 Hypertensive heart and chronic kidney disease with heart failure and stage 1 through stage 4 chronic kidney disease, or unspecified chronic kidney disease: Secondary | ICD-10-CM | POA: Diagnosis not present

## 2015-11-24 DIAGNOSIS — I2510 Atherosclerotic heart disease of native coronary artery without angina pectoris: Secondary | ICD-10-CM | POA: Diagnosis not present

## 2015-11-24 DIAGNOSIS — M109 Gout, unspecified: Secondary | ICD-10-CM | POA: Diagnosis not present

## 2015-11-24 DIAGNOSIS — R531 Weakness: Secondary | ICD-10-CM | POA: Diagnosis not present

## 2015-11-24 DIAGNOSIS — N183 Chronic kidney disease, stage 3 (moderate): Secondary | ICD-10-CM | POA: Diagnosis not present

## 2015-11-24 DIAGNOSIS — I482 Chronic atrial fibrillation: Secondary | ICD-10-CM | POA: Diagnosis not present

## 2015-11-24 DIAGNOSIS — D649 Anemia, unspecified: Secondary | ICD-10-CM | POA: Diagnosis not present

## 2015-11-24 DIAGNOSIS — E1122 Type 2 diabetes mellitus with diabetic chronic kidney disease: Secondary | ICD-10-CM | POA: Diagnosis not present

## 2015-11-24 DIAGNOSIS — I5032 Chronic diastolic (congestive) heart failure: Secondary | ICD-10-CM | POA: Diagnosis not present

## 2015-11-24 DIAGNOSIS — R1312 Dysphagia, oropharyngeal phase: Secondary | ICD-10-CM | POA: Diagnosis not present

## 2015-11-24 DIAGNOSIS — Z48812 Encounter for surgical aftercare following surgery on the circulatory system: Secondary | ICD-10-CM | POA: Diagnosis not present

## 2015-11-24 LAB — POCT INR: INR: 1.4

## 2015-11-24 NOTE — Telephone Encounter (Signed)
I called the home to be sure that the [facility] from [place] had drawn his blood for an INR today and she said yes. She also said he had had some kind of spell today while the physical therapist was there.  As he was walking to the scales with his walker, he became nauseated, started shaking and his eyes rolled back.  EMS was called but he refused to go to the hospital as was suggested.

## 2015-11-27 ENCOUNTER — Ambulatory Visit (INDEPENDENT_AMBULATORY_CARE_PROVIDER_SITE_OTHER): Payer: Medicare Other | Admitting: Pharmacist

## 2015-11-27 ENCOUNTER — Encounter: Payer: Medicare Other | Admitting: Thoracic Surgery (Cardiothoracic Vascular Surgery)

## 2015-11-27 ENCOUNTER — Other Ambulatory Visit: Payer: Self-pay | Admitting: *Deleted

## 2015-11-27 ENCOUNTER — Telehealth: Payer: Self-pay | Admitting: Family Medicine

## 2015-11-27 DIAGNOSIS — I482 Chronic atrial fibrillation: Secondary | ICD-10-CM | POA: Diagnosis not present

## 2015-11-27 DIAGNOSIS — M1991 Primary osteoarthritis, unspecified site: Secondary | ICD-10-CM | POA: Diagnosis not present

## 2015-11-27 DIAGNOSIS — E785 Hyperlipidemia, unspecified: Secondary | ICD-10-CM | POA: Diagnosis not present

## 2015-11-27 DIAGNOSIS — I272 Other secondary pulmonary hypertension: Secondary | ICD-10-CM | POA: Diagnosis not present

## 2015-11-27 DIAGNOSIS — Z7901 Long term (current) use of anticoagulants: Secondary | ICD-10-CM

## 2015-11-27 DIAGNOSIS — D649 Anemia, unspecified: Secondary | ICD-10-CM | POA: Diagnosis not present

## 2015-11-27 DIAGNOSIS — N183 Chronic kidney disease, stage 3 (moderate): Secondary | ICD-10-CM | POA: Diagnosis not present

## 2015-11-27 DIAGNOSIS — I2510 Atherosclerotic heart disease of native coronary artery without angina pectoris: Secondary | ICD-10-CM | POA: Diagnosis not present

## 2015-11-27 DIAGNOSIS — E1122 Type 2 diabetes mellitus with diabetic chronic kidney disease: Secondary | ICD-10-CM | POA: Diagnosis not present

## 2015-11-27 DIAGNOSIS — I5032 Chronic diastolic (congestive) heart failure: Secondary | ICD-10-CM | POA: Diagnosis not present

## 2015-11-27 DIAGNOSIS — I130 Hypertensive heart and chronic kidney disease with heart failure and stage 1 through stage 4 chronic kidney disease, or unspecified chronic kidney disease: Secondary | ICD-10-CM | POA: Diagnosis not present

## 2015-11-27 DIAGNOSIS — Z48812 Encounter for surgical aftercare following surgery on the circulatory system: Secondary | ICD-10-CM | POA: Diagnosis not present

## 2015-11-27 DIAGNOSIS — Z9889 Other specified postprocedural states: Secondary | ICD-10-CM

## 2015-11-27 DIAGNOSIS — R1312 Dysphagia, oropharyngeal phase: Secondary | ICD-10-CM | POA: Diagnosis not present

## 2015-11-27 DIAGNOSIS — M109 Gout, unspecified: Secondary | ICD-10-CM | POA: Diagnosis not present

## 2015-11-27 NOTE — Telephone Encounter (Signed)
[name] w/ [facility] called to advise Dr. Dennard Schaumann that they were not able to go out to do his OT eval last week so they will be going this week. Any questions or concerns call [phone]

## 2015-11-27 NOTE — Patient Outreach (Signed)
[facility]) Care Management  11/27/2015  [name] 18-Mar-1929 [id number]   Phone call to patient to assess for social work needs post discharge from the skilled nursing facility.  Per patient's wife, patient was with the home health nurse and could not talk.    Plan:  This Education officer, museum will call patient back within the end of the week.    [name] [facility] Care Management [phone]

## 2015-12-01 ENCOUNTER — Encounter: Payer: Self-pay | Admitting: *Deleted

## 2015-12-01 ENCOUNTER — Other Ambulatory Visit: Payer: Self-pay | Admitting: *Deleted

## 2015-12-01 ENCOUNTER — Ambulatory Visit (INDEPENDENT_AMBULATORY_CARE_PROVIDER_SITE_OTHER): Payer: Medicare Other | Admitting: Family Medicine

## 2015-12-01 ENCOUNTER — Telehealth: Payer: Self-pay | Admitting: Family Medicine

## 2015-12-01 ENCOUNTER — Encounter: Payer: Self-pay | Admitting: Family Medicine

## 2015-12-01 VITALS — BP 122/74 | HR 96 | Temp 97.8°F | Resp 18 | Ht 67.0 in | Wt 202.0 lb

## 2015-12-01 DIAGNOSIS — I130 Hypertensive heart and chronic kidney disease with heart failure and stage 1 through stage 4 chronic kidney disease, or unspecified chronic kidney disease: Secondary | ICD-10-CM | POA: Diagnosis not present

## 2015-12-01 DIAGNOSIS — M109 Gout, unspecified: Secondary | ICD-10-CM | POA: Diagnosis not present

## 2015-12-01 DIAGNOSIS — M5416 Radiculopathy, lumbar region: Secondary | ICD-10-CM | POA: Diagnosis not present

## 2015-12-01 DIAGNOSIS — I2510 Atherosclerotic heart disease of native coronary artery without angina pectoris: Secondary | ICD-10-CM | POA: Diagnosis not present

## 2015-12-01 DIAGNOSIS — I482 Chronic atrial fibrillation: Secondary | ICD-10-CM | POA: Diagnosis not present

## 2015-12-01 DIAGNOSIS — I5032 Chronic diastolic (congestive) heart failure: Secondary | ICD-10-CM | POA: Diagnosis not present

## 2015-12-01 DIAGNOSIS — E785 Hyperlipidemia, unspecified: Secondary | ICD-10-CM | POA: Diagnosis not present

## 2015-12-01 DIAGNOSIS — N183 Chronic kidney disease, stage 3 (moderate): Secondary | ICD-10-CM | POA: Diagnosis not present

## 2015-12-01 DIAGNOSIS — R1312 Dysphagia, oropharyngeal phase: Secondary | ICD-10-CM | POA: Diagnosis not present

## 2015-12-01 DIAGNOSIS — I272 Other secondary pulmonary hypertension: Secondary | ICD-10-CM | POA: Diagnosis not present

## 2015-12-01 DIAGNOSIS — M1991 Primary osteoarthritis, unspecified site: Secondary | ICD-10-CM | POA: Diagnosis not present

## 2015-12-01 DIAGNOSIS — D649 Anemia, unspecified: Secondary | ICD-10-CM | POA: Diagnosis not present

## 2015-12-01 DIAGNOSIS — Z48812 Encounter for surgical aftercare following surgery on the circulatory system: Secondary | ICD-10-CM | POA: Diagnosis not present

## 2015-12-01 DIAGNOSIS — E1122 Type 2 diabetes mellitus with diabetic chronic kidney disease: Secondary | ICD-10-CM | POA: Diagnosis not present

## 2015-12-01 MED ORDER — GABAPENTIN 100 MG PO CAPS: 100.0000 mg | ORAL_CAPSULE | Freq: Three times a day (TID) | ORAL | Status: DC

## 2015-12-01 NOTE — Progress Notes (Signed)
Subjective:    Patient ID: [name], male    DOB: 07-05-1929, 82 y.o.   MRN: [id number]  HPI  Was recently admitted to the hospital at the end of March with acute congestive heart failure. I have copied relevant portions of the discharge summary and included them below for my reference:    Admit date: 10/17/2015 Discharge date: 10/31/2015  Primary Care Provider: [facility] Primary Cardiologist: Dr. Percival Spanish  Discharge Diagnoses   Principal Problem:  Acute on chronic diastolic CHF Active Problems:  Hypertension  Heart failure (HCC)  Pressure ulcer  Paroxysmal Atrial fibrillation  Altered mental status  Elevated LFTs and coagulopathy  Coronary artery disease s/p DES to LAD and RCA in 4/16  Normocytic anemia.  Chronic subdural hematoma   R groin wound  Dysphagia  R ankle pain  AKI  S/p mini MV repair on 10/04/2015  Allergies No Known Allergies  Diagnostic Studies/Procedures      History of Present Illness    [name] is an 82 yo white male with long-standing history of mitral valve prolapse with mitral regurgitation and chronic diastolic congestive heart failure, coronary artery disease status post PCI and stenting of the LAD and RCA using drug-eluting stents, recurrent SVT s/p DCCV, pulmonary hypertension, type 2 diabetes mellitus, hyperlipidemia, and chronic back pain who recently underwent minimally invasive Mitral Valve Repair 10/04/15 and presented [facility] ED with SOB and LE edema 10/17/15.   Patient recently underwent a minimally-invasive mitral valve repair on 3/1 and his post-op course was complicated by LVOT obstruction due to [facility], which resolved with adjustment of inotropic medications. He developed atrial fibrillation and was put on amiodarone and Coumadin. He was discharged on 3/10 to home.  Upon presentation, the patient was altered on exam and not able to answer questions consistently, so history was obtained from  speaking to the wife on the phone [phone]) and chart review. Per wife, he has been very confused and not himself since dischage. He has been talking all night, not making any sense. He gets up constantly to urinate, but nothing comes out. His personality is different, as well, he has been uncharacteristically rude to his wife, telling her she was the devil. She notes that he can't swallow and easily chokes, even a small pill, since being home. His wife has been giving him his meds in apple sauce. He hasn't really ate anything of substance since coming home. He has been taking his Coumadin since discharge. His wife also notes that his LE edema has become significantly worse since discharge a few days ago.  In ED - BNP 763; Troponin (POC) 0.44; INR 6.13 (up from 3.66 on 3/10 at discharge)  Bedside sonosite echo performed by cardiology fellow was technically difficult, revealed EF >55%, possible RV dilation and low normal function. No significant mitral regurgitation was appreciated. Left atrial enlargement. Small pericardial effusion without evidence of tamponade.   Hospital Course    Consultants: Cardiothoracic Surgery, urology, would care  1. Acute on chronic diastolic CHF - significant - however tricky situation, not good response to iv lasix, soft BP and LVOT obstruction, we don't want to replete intravascular volume. Felt better after DCCV and maintaining sinus rhythm. Diuresed with IV lasix and zaroxolyn then diuretics were hold due to elevated creatinine. Leading to minimal improvement of Scr. It was felt that managing his volume status would be hard for the remainder of his life. I&O negative 2.3L with 24lb weight loss (220-->196lb). Uncertain about I/O accuracy  and weight. BP soft low. Reduced metoprolol to 12.58m BID.   2. Altered mental status/Subdural hematoma.  Given INR >6, concern for possible intracranial bleed, CT head showed 14 mm RIGHT holo hemispheric chronic  subdural hematoma without midline shift (minimally increased from prior).-- check ammonia level was 24 (WNL). Improved AMS with improvement of INR.   3. Elevated LFTs: . Likely due to hepatic congestion, although more of an obstructive pattern, with alk phos and total bili elevated and minimal transaminitis. Less likely due to amiodarone, which was recently initiated. Abdominal ultrasound showed stones and sludge within GB and GB wall thickening, possible mild hepatic cirrhosis, minimal ascites. LFT improved with diuresis.  4. Coagulopathy: Held Coumadin as INR of > 6. Given Vitamin K, even though his INR is < 10 and he has no overt bleeding, he is at greater risk of bleeding (age, decompensated heart failure, and expanding chronic subdural hematoma on head CT). Placed on heparin when INR was less than 2 for anticoagulation. Coumadin managed by pharmacy. Discharge dose adjusted by pharmacy and recheck INR in 1 week.   5. Paroxysmal Atrial fibrillation. Rate was controlled. Started on IV heparin for anticoagulation. Subsequently underwent DCCV and maintained sinus rhythm throughout. Post DCCV restarted on coumadin. hed has been adequately loaded with amiodarone 400 mg twice a day since 10/13/15 to 10/29/15. Reduced to 205mQD as maintenance dose. After proximally 1 month if he remains in sinus rhythm, advocate discontinuation of amiodarone.  6. Leukocytosis. Normal differential. No other objective evidence of infection, likely reactive in setting of acute illness. Surgical incisions appear to be healing well. CXR was concerning for PNA and started on Ceftriaxone and Vancomycin .   7. Hematuria and UTI: hx of prostate cancer: Initial UA was clear. The patient has urinary retention at home after recent discharge. Demmed foley was placed in ED. Than he has UTI and bloody urine in foley bag. Ceftriaxone will treat it. Seen by Urology for hematuria which removed foley with resolution of hematuria.   8.  Coronary artery disease s/p DES to LAD and RCA in 4/16. EKG without acute ischemic changes. Troponin is mildly elevated (0.55-->0.41), but this is likely demand in the setting of volume overload. Was not discharged on DAPT, but he is almost 1 year out from intervention, so low risk for stent thrombosis. Continued aspirin 81 mg, as there is no overt signs of bleeding. Continue pravastatin (unclear why he is not on a high-potency statin, but this can be addressed by his primary cardiologist)  9. Normocytic anemia. No significant difference from baseline while in the hospital, monitored closely in the setting of elevated INR. Remained stable.   10 AKI. Scr worsened with diuretics and initiation of colchicine. Recheck BMET as outpatient.   11. R ankle pain/ possible gout: Uric acid was elevated. Tx with colchicine. Improved. Decreased dose due to elevated creatinine. Discontinue colchicine during office visit. F/u with PCP.   12. Right groin wound: superficially dehisced and slight yellow slough. Followed by wound care. Plan made to keep it dry. Will followed by Surgery closely and will schedule f/u appointment.   13. Dysphagia: underwent speech evaluation. Recommended Nectar-thick liquid.   The patient has been seen by Dr. SkMarlou Porchnd Dr.Owen today and deemed ready for discharge home. All follow-up appointments have been scheduled. Discharge medications are listed below.   Will resume lasix 2055mO QD. Decrease KCL to 62m89mD. BMET in week. Would care f/u with TCTS. INR check in week.  He has already followed up with Dr. Ricard Dillon.  I have reviewed that office visit. No changes were made in his medication. At the present time he does have +1 pitting edema in his right leg. He only has trace pitting edema in his left leg. For this patient that is actually very good. He appears to be euvolemic. He is in normal sinus rhythm with a systolic ejection murmur but there are no crackles or evidence of  pulmonary edema on his pulmonary exam. He denies any trouble breathing. His INR was subtherapeutic earlier this week but the cardiologist is only changed his Coumadin dose and they're scheduled follow-up next week. He has an appointment to see his cardiologist on May 5. His renal function was checked a little more than a week ago and was normal. His primary concern today is persistent pain radiating into his left leg. The pain is located on the anterior portion of his left thigh. It radiates from his lower back down the anterior left thigh to his knee. It is burning and stinging. It is constant. It radiates and throbs. There is no exacerbating or alleviating factors. Review of his MRI of his lumbar spine 2010 revealed severe spinal stenosis and also by foraminal stenosis worse at L5-S1 all of which could cause lumbar radiculopathy. Past Medical History  Diagnosis Date  . Coronary artery disease     a. 11/2014 NSTEMI: DESx 2 placed to LAD and RCA   . Mitral valve prolapse   . Diabetes mellitus, type II ([place])     a. HgA1c 6.8 in 03/2015  . Mitral regurgitation     a. severe wtih flail leaflet by 2D ECHO (06/05/15)  . SVT (supraventricular tachycardia) (HCC)     a. recurrent, usually responsive to vagal manuevers  . HTN (hypertension)   . Pulmonary HTN ([place])   . Lower extremity edema     a. chronic  . HLD (hyperlipidemia)   . Hypertension   . OA (osteoarthritis)   . ED (erectile dysfunction)   . Chronic fatigue   . Gout   . Spinal stenosis   . Prostate cancer ([place])   . Severe mitral regurgitation 11/17/2014    Mitral valve prolapse with flail segment of anterior leaflet   . Chronic diastolic (congestive) heart failure ([place])   . Hemangioma of liver 08/02/2015    12 mm enhancing lesion seen on CT - suspicious for benign hemangioma but not diagnostic - MRI recommended  . Dysrhythmia   . Heart murmur   . Shortness of breath dyspnea   . Kidney stones   . Physical deconditioning   . S/P  minimally invasive mitral valve repair 10/04/2015    Complex valvuloplasty including artificial Gore-tex neochord placement x6 with plication closure of cleft between P2 and P3 and 30 mm Sorin Memo 3D ring annuloplasty via right mini thoracotomy approach   . Hypertensive left ventricular hypertrophy with heart failure ([place]) 10/04/2015  . Hypertensive cardiomyopathy [facility])    Past Surgical History  Procedure Laterality Date  . Cardiac surgery      2 cardiac stents  . Joint replacement      shoulder  . Back surgery x 2    . Rt rotator cuff repair    . Cervical spine surgery    . Back surgery    . Left heart catheterization with coronary angiogram N/A 11/16/2014    Procedure: LEFT HEART CATHETERIZATION WITH CORONARY ANGIOGRAM;  Surgeon: Troy Sine, MD;  Location: [facility] CATH LAB;  Service: Cardiovascular;  Laterality: N/A;  . Tee without cardioversion N/A 06/22/2015    Procedure: TRANSESOPHAGEAL ECHOCARDIOGRAM (TEE);  Surgeon: Skeet Latch, MD;  Location: [place];  Service: Cardiovascular;  Laterality: N/A;  . Cardiac catheterization N/A 08/08/2015    Procedure: Right/Left Heart Cath and Coronary Angiography;  Surgeon: Sherren Mocha, MD;  Location: [place] CV LAB;  Service: Cardiovascular;  Laterality: N/A;  . Eye surgery    . Mitral valve repair Right 10/04/2015    Procedure: MINIMALLY INVASIVE MITRAL VALVE REPAIR (MVR);  Surgeon: Rexene Alberts, MD;  Location: [place];  Service: Open Heart Surgery;  Laterality: Right;  . Tee without cardioversion N/A 10/04/2015    Procedure: TRANSESOPHAGEAL ECHOCARDIOGRAM (TEE);  Surgeon: Rexene Alberts, MD;  Location: [place];  Service: Open Heart Surgery;  Laterality: N/A;  . Wound exploration N/A 10/05/2015    Procedure: Sternal Incision;  Surgeon: Rexene Alberts, MD;  Location: [place];  Service: Open Heart Surgery;  Laterality: N/A;  . Intraoperative transesophageal echocardiogram  10/05/2015    Procedure: INTRAOPERATIVE TRANSESOPHAGEAL ECHOCARDIOGRAM;   Surgeon: Rexene Alberts, MD;  Location: [place];  Service: Open Heart Surgery;;  . Cardioversion N/A 10/20/2015    Procedure: CARDIOVERSION;  Surgeon: Pixie Casino, MD;  Location: [facility] ENDOSCOPY;  Service: Cardiovascular;  Laterality: N/A;   Current Outpatient Prescriptions on File Prior to Visit  Medication Sig Dispense Refill  . acetaminophen (TYLENOL) 500 MG tablet Take 2 tablets (1,000 mg total) by mouth every 6 (six) hours as needed for mild pain or fever. 30 tablet 0  . amiodarone (PACERONE) 200 MG tablet Take 1 tablet (200 mg total) by mouth daily. 30 tablet 1  . aspirin EC 81 MG tablet Take 81 mg by mouth daily.    . colchicine 0.6 MG tablet Take 1 tablet (0.6 mg total) by mouth daily. 14 tablet 0  . finasteride (PROSCAR) 5 MG tablet TAKE ONE (1) TABLET EACH DAY 30 tablet 3  . fluticasone (FLONASE) 50 MCG/ACT nasal spray Place 1 spray into both nostrils daily as needed for allergies or rhinitis.    . furosemide (LASIX) 40 MG tablet Take 0.5 tablets (20 mg total) by mouth daily. (Patient taking differently: Take 40 mg by mouth 2 (two) times daily. ) 30 tablet 1  . metoprolol tartrate (LOPRESSOR) 25 MG tablet Take 0.5 tablets (12.5 mg total) by mouth 2 (two) times daily. 60 tablet 1  . potassium chloride SA (K-DUR,KLOR-CON) 20 MEQ tablet Take 1 tablet (20 mEq total) by mouth daily. 30 tablet 1  . pravastatin (PRAVACHOL) 40 MG tablet Take 1 tablet (40 mg total) by mouth daily at 6 PM. (Patient taking differently: Take 40 mg by mouth daily. ) 30 tablet 11  . tamsulosin (FLOMAX) 0.4 MG CAPS capsule Take 1 capsule (0.4 mg total) by mouth daily. 90 capsule 1  . traMADol (ULTRAM) 50 MG tablet Take 1 tablet (50 mg total) by mouth every 6 (six) hours as needed for moderate pain. 20 tablet 0  . warfarin (COUMADIN) 2 MG tablet Take 0.5 tablets (1 mg total) by mouth daily at 6 PM. Or as directed 30 tablet 1   No current facility-administered medications on file prior to visit.   No Known  Allergies Social History   Social History  . Marital Status: Married    Spouse Name: N/A  . Number of Children: 4  . Years of Education: N/A   Occupational History  .     Social History Main  Topics  . Smoking status: Former Research scientist (life sciences)  . Smokeless tobacco: Never Used     Comment: QUIT SMOKING  MANY YEARS AGO"  . Alcohol Use: No  . Drug Use: No  . Sexual Activity: Not on file   Other Topics Concern  . Not on file   Social History Narrative   ** Merged History Encounter **       Four adopted children.  Lives alone.      Review of Systems  All other systems reviewed and are negative.      Objective:   Physical Exam  Neck: Neck supple. No JVD present.  Cardiovascular: Normal rate and regular rhythm.   Murmur heard. Pulmonary/Chest: Effort normal. He has no wheezes. He has no rales.  Abdominal: Soft. Bowel sounds are normal.  Musculoskeletal: He exhibits edema.  Lymphadenopathy:    He has no cervical adenopathy.  Neurological: He has normal reflexes. No cranial nerve deficit. He exhibits normal muscle tone.  Vitals reviewed.         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 Begin gabapentin 100 mg by mouth daily and gradually increased to 100 mg by mouth 3 times a day. Monitor for sedation and confusion. Recheck next week or sooner if worse.

## 2015-12-01 NOTE — Patient Outreach (Signed)
            ROS  Physical Exam  Constitutional: He is oriented to person, place, and time. He appears well-developed and well-nourished.  Cardiovascular: Normal rate, regular rhythm and normal heart sounds.   Respiratory: Effort normal and breath sounds normal.  GI: Soft. Bowel sounds are normal.  Musculoskeletal: He exhibits edema.  Edema +2 to top of right foot, 1-2 + left lower leg.   Neurological: He is alert and oriented to person, place, and time.  Skin: Skin is warm and dry.  Psychiatric: He has a normal mood and affect. His behavior is normal. Judgment and thought content normal.

## 2015-12-01 NOTE — Telephone Encounter (Signed)
Need orders for duration and frequency for Occupational Therapy.  1 x week for week 1 the 2 x week for 4 weeks.   Approval given.

## 2015-12-01 NOTE — Patient Outreach (Signed)
[facility]) Care Management  12/01/2015  [name] 10-Dec-1928 [id number]  Phone call to patient to follow up on rehabilitation stay and to assess for any further social work needs. HIPPA compliant  voicemail left requesting a return call.   [name] [facility] Care Management [phone]

## 2015-12-01 NOTE — Telephone Encounter (Signed)
ok 

## 2015-12-04 DIAGNOSIS — M1991 Primary osteoarthritis, unspecified site: Secondary | ICD-10-CM | POA: Diagnosis not present

## 2015-12-04 DIAGNOSIS — I2510 Atherosclerotic heart disease of native coronary artery without angina pectoris: Secondary | ICD-10-CM | POA: Diagnosis not present

## 2015-12-04 DIAGNOSIS — E1122 Type 2 diabetes mellitus with diabetic chronic kidney disease: Secondary | ICD-10-CM | POA: Diagnosis not present

## 2015-12-04 DIAGNOSIS — D649 Anemia, unspecified: Secondary | ICD-10-CM | POA: Diagnosis not present

## 2015-12-04 DIAGNOSIS — I272 Other secondary pulmonary hypertension: Secondary | ICD-10-CM | POA: Diagnosis not present

## 2015-12-04 DIAGNOSIS — Z48812 Encounter for surgical aftercare following surgery on the circulatory system: Secondary | ICD-10-CM | POA: Diagnosis not present

## 2015-12-04 DIAGNOSIS — E785 Hyperlipidemia, unspecified: Secondary | ICD-10-CM | POA: Diagnosis not present

## 2015-12-04 DIAGNOSIS — I130 Hypertensive heart and chronic kidney disease with heart failure and stage 1 through stage 4 chronic kidney disease, or unspecified chronic kidney disease: Secondary | ICD-10-CM | POA: Diagnosis not present

## 2015-12-04 DIAGNOSIS — I482 Chronic atrial fibrillation: Secondary | ICD-10-CM | POA: Diagnosis not present

## 2015-12-04 DIAGNOSIS — I5032 Chronic diastolic (congestive) heart failure: Secondary | ICD-10-CM | POA: Diagnosis not present

## 2015-12-04 DIAGNOSIS — R1312 Dysphagia, oropharyngeal phase: Secondary | ICD-10-CM | POA: Diagnosis not present

## 2015-12-04 DIAGNOSIS — M109 Gout, unspecified: Secondary | ICD-10-CM | POA: Diagnosis not present

## 2015-12-04 DIAGNOSIS — N183 Chronic kidney disease, stage 3 (moderate): Secondary | ICD-10-CM | POA: Diagnosis not present

## 2015-12-04 NOTE — Patient Outreach (Addendum)
[facility]) Care Management   Home visit   12/01/15  [name] 1928-09-24 [id number]  [name] is an 82 y.o. male  Subjective:  Pt reports saw 2 doctors last week, received  A good reports, to f/u with Dr. Roxy Manns 5/5.  Pt reports to see  Dr. Dennard Schaumann today about edema in both legs.   Pt reports he fell last night, slipped in the kitchen, had on regular socks, no injury.   Pt reports HH RN, [place] PT coming.    Objective:   Filed Vitals:   12/01/15 1046  BP: 120/70  Pulse: 75  Resp: 16    ROS  Physical Exam  Constitutional: He is oriented to person, place, and time. He appears well-developed and well-nourished.  Cardiovascular: Normal rate, regular rhythm and normal heart sounds.   Respiratory: Effort normal and breath sounds normal.  GI: Soft.  Musculoskeletal: Normal range of motion. He exhibits edema.  Using walker at times with ambulation  Top of right foot 1-2+ edema, left lower leg 1-2+ edema.    Neurological: He is alert and oriented to person, place, and time.  Skin: Skin is warm and dry.  Psychiatric: He has a normal mood and affect. His behavior is normal. Judgment and thought content normal.    Encounter Medications:   Outpatient Encounter Prescriptions as of 12/01/2015  Medication Sig Note  . acetaminophen (TYLENOL) 500 MG tablet Take 2 tablets (1,000 mg total) by mouth every 6 (six) hours as needed for mild pain or fever.   Marland Kitchen amiodarone (PACERONE) 200 MG tablet Take 1 tablet (200 mg total) by mouth daily.   Marland Kitchen aspirin EC 81 MG tablet Take 81 mg by mouth daily.   . colchicine 0.6 MG tablet Take 1 tablet (0.6 mg total) by mouth daily.   . finasteride (PROSCAR) 5 MG tablet TAKE ONE (1) TABLET EACH DAY   . fluticasone (FLONASE) 50 MCG/ACT nasal spray Place 1 spray into both nostrils daily as needed for allergies or rhinitis. 12/01/2015: As needed, not everyday   . furosemide (LASIX) 40 MG tablet Take 0.5 tablets (20 mg total) by mouth daily.  (Patient taking differently: Take 40 mg by mouth 2 (two) times daily. ) 12/01/2015: Pt taking 40 mg- takes 1.5 tablets daily  . metoprolol tartrate (LOPRESSOR) 25 MG tablet Take 0.5 tablets (12.5 mg total) by mouth 2 (two) times daily.   . potassium chloride SA (K-DUR,KLOR-CON) 20 MEQ tablet Take 1 tablet (20 mEq total) by mouth daily. 12/01/2015: Pt taking one tablet 20 meq twice a day   . pravastatin (PRAVACHOL) 40 MG tablet Take 1 tablet (40 mg total) by mouth daily at 6 PM. (Patient taking differently: Take 40 mg by mouth daily. )   . tamsulosin (FLOMAX) 0.4 MG CAPS capsule Take 1 capsule (0.4 mg total) by mouth daily.   Marland Kitchen warfarin (COUMADIN) 2 MG tablet Take 0.5 tablets (1 mg total) by mouth daily at 6 PM. Or as directed   . [DISCONTINUED] feeding supplement, ENSURE ENLIVE, (ENSURE ENLIVE) LIQD Take 237 mLs by mouth 2 (two) times daily. 12/01/2015: Taking occasionally   . [DISCONTINUED] Menthol-Methyl Salicylate (MUSCLE RUB) 10-15 % CREA Apply 1 application topically 2 (two) times daily as needed for muscle pain.    . traMADol (ULTRAM) 50 MG tablet Take 1 tablet (50 mg total) by mouth every 6 (six) hours as needed for moderate pain.    No facility-administered encounter medications on file as of 12/01/2015.  Functional Status:   In your present state of health, do you have any difficulty performing the following activities: 12/04/2015 12/01/2015  Hearing? - Y  Vision? - -  Difficulty concentrating or making decisions? - N  Walking or climbing stairs? - Y  Dressing or bathing? - N  Doing errands, shopping? Y -  Conservation officer, nature and eating ? - Y  Using the Toilet? - N  In the past six months, have you accidently leaked urine? - -  Do you have problems with loss of bowel control? - -  Managing your Medications? Y -  Managing your Finances? - -  Housekeeping or managing your Housekeeping? Y -    Fall/Depression Screening:    PHQ 2/9 Scores 12/01/2015 11/09/2015 04/26/2014 04/26/2014 11/16/2013  10/11/2013  PHQ - 2 Score 0 0 0 2 0 0  PHQ- 9 Score - - 0 7 - -    Assessment:  Pleasant 82 year old gentleman, resides with spouse.                            HF- edema noted in left lower leg, top of right foot.  No c/o pain.   [facility] arrived during home visit and worked with pt, no sob reported by pt ambulating.   Reported weight 205 lbs., reinforced need to record,report weight gain of >2 lbs in a day, 5 lbs in a week.                          Recent fall-  Per pt, fell last night (had on regular socks).    Plan:  Pt to f/u with Dr. Dennard Schaumann 4/28.             Pt to daily, record, call MD for weight gain.             Pt to wear slippers, use walker to avoid falls.             Plan to continue to follow pt for transition of care, f/u again telephonically 5/5.   [facility] CM Care Plan Problem Two        Most Recent Value   Care Plan Problem Two  Risk for readmission related to recent SNF discharge - 3 admits in 6 months    Role Documenting the Problem Two  Care Management [place] for Problem Two  Active   Interventions for Problem Two Long Term Goal   Reinforced with pt importance of weighing daily/report weight gain of > 2lbs in a day, 5 lbs in a week    [facility] Long Term Goal (31-90) days  Pt would not readmit 31 days from day of discharge    [facility] Long Term Goal Start Date  11/22/15   [facility] CM Short Term Goal #1 (0-30 days)  Pt would weigh daily/record for the next 30 days    [facility] CM Short Term Goal #1 Start Date  11/22/15   Interventions for Short Term Goal #2   Reinforced need to weigh daily     [facility] CM Care Plan Problem Three        Most Recent Value   Care Plan Problem Three  fall risk- recent fall (no injury)   Role Documenting the Problem Three  Care Management Coordinator   Care Plan for Problem Three  Active   [facility] CM Short Term Goal #1 (  0-30 days)  Pt would not have any recurrent falls in the next  14 days    [facility] CM Short Term Goal #1 Start Date  12/01/15    Interventions for Short Term Goal #1  Reinforced with pt use of slippers, walker to avoid recurrent fall.       Zara Chess.   [place] Care Management  [phone]

## 2015-12-06 ENCOUNTER — Ambulatory Visit (INDEPENDENT_AMBULATORY_CARE_PROVIDER_SITE_OTHER): Payer: Medicare Other | Admitting: Pharmacist

## 2015-12-06 DIAGNOSIS — E1122 Type 2 diabetes mellitus with diabetic chronic kidney disease: Secondary | ICD-10-CM | POA: Diagnosis not present

## 2015-12-06 DIAGNOSIS — D649 Anemia, unspecified: Secondary | ICD-10-CM | POA: Diagnosis not present

## 2015-12-06 DIAGNOSIS — M109 Gout, unspecified: Secondary | ICD-10-CM | POA: Diagnosis not present

## 2015-12-06 DIAGNOSIS — M1991 Primary osteoarthritis, unspecified site: Secondary | ICD-10-CM | POA: Diagnosis not present

## 2015-12-06 DIAGNOSIS — N183 Chronic kidney disease, stage 3 (moderate): Secondary | ICD-10-CM | POA: Diagnosis not present

## 2015-12-06 DIAGNOSIS — Z48812 Encounter for surgical aftercare following surgery on the circulatory system: Secondary | ICD-10-CM | POA: Diagnosis not present

## 2015-12-06 DIAGNOSIS — Z9889 Other specified postprocedural states: Secondary | ICD-10-CM

## 2015-12-06 DIAGNOSIS — R1312 Dysphagia, oropharyngeal phase: Secondary | ICD-10-CM | POA: Diagnosis not present

## 2015-12-06 DIAGNOSIS — I482 Chronic atrial fibrillation: Secondary | ICD-10-CM | POA: Diagnosis not present

## 2015-12-06 DIAGNOSIS — Z7901 Long term (current) use of anticoagulants: Secondary | ICD-10-CM

## 2015-12-06 DIAGNOSIS — E785 Hyperlipidemia, unspecified: Secondary | ICD-10-CM | POA: Diagnosis not present

## 2015-12-06 DIAGNOSIS — I5032 Chronic diastolic (congestive) heart failure: Secondary | ICD-10-CM | POA: Diagnosis not present

## 2015-12-06 DIAGNOSIS — I130 Hypertensive heart and chronic kidney disease with heart failure and stage 1 through stage 4 chronic kidney disease, or unspecified chronic kidney disease: Secondary | ICD-10-CM | POA: Diagnosis not present

## 2015-12-06 DIAGNOSIS — I272 Other secondary pulmonary hypertension: Secondary | ICD-10-CM | POA: Diagnosis not present

## 2015-12-06 DIAGNOSIS — I2510 Atherosclerotic heart disease of native coronary artery without angina pectoris: Secondary | ICD-10-CM | POA: Diagnosis not present

## 2015-12-06 LAB — POCT INR: INR: 1.2

## 2015-12-07 NOTE — Progress Notes (Signed)
   HPI  The patient for followup of HeFPEF.well preserved ejection fraction, narrow complex tachycardia and MR.  He has a complicated history including mitral valve prolapse with severe mitral regurgitation, chronic diastolic congestive heart failure, coronary artery disease status post PCI and stenting, recurrent SVT, atrial fibrillation, pulmonary hypertension, type 2 diabetes mellitus, hyperlipidemia and chronic back pain.  He is now status post minimally invasive mitral valve repair on 10/04/2015 for severe symptomatic primary mitral regurgitation. The patient's immediate postoperative recovery during the initial 12 hours following surgery were complicated by the development of systolic anterior motion of the mitral valve with severe mitral regurgitation. This resolved completely with beta-blockade and volume administration. He developed postoperative atrial fibrillation and was started on amiodarone. He was anticoagulated using warfarin.  He underwent DC cardioversion and maintained sinus rhythm throughout the remainder of his initial hospitalization. He was rehospitalized after discharge with AMS.  He was managed for acute on chronic diastolic heart failure. His altered mental status was evaluated with a head CT which showed chronic subdural hematoma. Of note he was supratherapeutic with his INR. His altered mental status did improve. He did have elevated liver function tests thought possibly to be related to gallbladder sludge. Was not thought to be related to his amiodarone. These improved. He had a urinary tract infection with hematuria that was treated with antibiotics. This resolved. He did have some renal insufficiency which worsened because of his diuresis.  He has been back to see Rosaria Ferries PAc and Dr. Roxy Manns   No show

## 2015-12-08 ENCOUNTER — Ambulatory Visit: Payer: Self-pay | Admitting: *Deleted

## 2015-12-08 ENCOUNTER — Encounter: Payer: Medicare Other | Admitting: Cardiology

## 2015-12-08 ENCOUNTER — Emergency Department (HOSPITAL_COMMUNITY): Payer: Medicare Other

## 2015-12-08 ENCOUNTER — Observation Stay (HOSPITAL_COMMUNITY)
Admission: EM | Admit: 2015-12-08 | Discharge: 2015-12-11 | Disposition: A | Discharge status: Home / Self Care | Payer: Medicare Other | Attending: Cardiovascular Disease | Admitting: Cardiovascular Disease

## 2015-12-08 DIAGNOSIS — I110 Hypertensive heart disease with heart failure: Secondary | ICD-10-CM | POA: Diagnosis not present

## 2015-12-08 DIAGNOSIS — I480 Paroxysmal atrial fibrillation: Secondary | ICD-10-CM | POA: Insufficient documentation

## 2015-12-08 DIAGNOSIS — M109 Gout, unspecified: Secondary | ICD-10-CM | POA: Diagnosis not present

## 2015-12-08 DIAGNOSIS — I5033 Acute on chronic diastolic (congestive) heart failure: Secondary | ICD-10-CM | POA: Insufficient documentation

## 2015-12-08 DIAGNOSIS — R55 Syncope and collapse: Secondary | ICD-10-CM | POA: Diagnosis present

## 2015-12-08 DIAGNOSIS — I10 Essential (primary) hypertension: Secondary | ICD-10-CM | POA: Diagnosis present

## 2015-12-08 DIAGNOSIS — G253 Myoclonus: Secondary | ICD-10-CM | POA: Insufficient documentation

## 2015-12-08 DIAGNOSIS — I951 Orthostatic hypotension: Secondary | ICD-10-CM | POA: Diagnosis not present

## 2015-12-08 DIAGNOSIS — I2510 Atherosclerotic heart disease of native coronary artery without angina pectoris: Secondary | ICD-10-CM | POA: Diagnosis not present

## 2015-12-08 DIAGNOSIS — R600 Localized edema: Secondary | ICD-10-CM | POA: Diagnosis present

## 2015-12-08 DIAGNOSIS — I471 Supraventricular tachycardia: Secondary | ICD-10-CM | POA: Insufficient documentation

## 2015-12-08 DIAGNOSIS — Z955 Presence of coronary angioplasty implant and graft: Secondary | ICD-10-CM | POA: Diagnosis not present

## 2015-12-08 DIAGNOSIS — Z96619 Presence of unspecified artificial shoulder joint: Secondary | ICD-10-CM | POA: Diagnosis not present

## 2015-12-08 DIAGNOSIS — Z8546 Personal history of malignant neoplasm of prostate: Secondary | ICD-10-CM | POA: Diagnosis not present

## 2015-12-08 DIAGNOSIS — Z79899 Other long term (current) drug therapy: Secondary | ICD-10-CM | POA: Diagnosis not present

## 2015-12-08 DIAGNOSIS — R778 Other specified abnormalities of plasma proteins: Secondary | ICD-10-CM | POA: Insufficient documentation

## 2015-12-08 DIAGNOSIS — R531 Weakness: Secondary | ICD-10-CM | POA: Diagnosis not present

## 2015-12-08 DIAGNOSIS — Z7982 Long term (current) use of aspirin: Secondary | ICD-10-CM | POA: Diagnosis not present

## 2015-12-08 DIAGNOSIS — R001 Bradycardia, unspecified: Secondary | ICD-10-CM | POA: Insufficient documentation

## 2015-12-08 DIAGNOSIS — Z7901 Long term (current) use of anticoagulants: Secondary | ICD-10-CM | POA: Insufficient documentation

## 2015-12-08 DIAGNOSIS — I252 Old myocardial infarction: Secondary | ICD-10-CM | POA: Insufficient documentation

## 2015-12-08 DIAGNOSIS — Z8679 Personal history of other diseases of the circulatory system: Secondary | ICD-10-CM

## 2015-12-08 DIAGNOSIS — Z87891 Personal history of nicotine dependence: Secondary | ICD-10-CM | POA: Diagnosis not present

## 2015-12-08 DIAGNOSIS — E119 Type 2 diabetes mellitus without complications: Secondary | ICD-10-CM | POA: Diagnosis not present

## 2015-12-08 DIAGNOSIS — I272 Other secondary pulmonary hypertension: Secondary | ICD-10-CM | POA: Insufficient documentation

## 2015-12-08 DIAGNOSIS — R42 Dizziness and giddiness: Principal | ICD-10-CM | POA: Insufficient documentation

## 2015-12-08 DIAGNOSIS — Z9861 Coronary angioplasty status: Secondary | ICD-10-CM

## 2015-12-08 DIAGNOSIS — I5032 Chronic diastolic (congestive) heart failure: Secondary | ICD-10-CM

## 2015-12-08 DIAGNOSIS — Z9889 Other specified postprocedural states: Secondary | ICD-10-CM

## 2015-12-08 DIAGNOSIS — E785 Hyperlipidemia, unspecified: Secondary | ICD-10-CM | POA: Diagnosis present

## 2015-12-08 HISTORY — DX: Paroxysmal atrial fibrillation: I48.0

## 2015-12-08 HISTORY — DX: Orthostatic hypotension: I95.1

## 2015-12-08 LAB — I-STAT TROPONIN, ED
TROPONIN I, POC: 0.32 ng/mL — AB (ref 0.00–0.08)
Troponin i, poc: 0.35 ng/mL (ref 0.00–0.08)

## 2015-12-08 LAB — BASIC METABOLIC PANEL
ANION GAP: 10 (ref 5–15)
BUN: 16 mg/dL (ref 6–20)
CALCIUM: 8.6 mg/dL — AB (ref 8.9–10.3)
CHLORIDE: 101 mmol/L (ref 101–111)
CO2: 26 mmol/L (ref 22–32)
CREATININE: 1.38 mg/dL — AB (ref 0.61–1.24)
GFR, EST AFRICAN AMERICAN: 53 mL/min — AB (ref >60)
GFR, EST NON AFRICAN AMERICAN: 45 mL/min — AB (ref >60)
Glucose, Bld: 102 mg/dL — ABNORMAL HIGH (ref 65–99)
Potassium: 4.2 mmol/L (ref 3.5–5.1)
SODIUM: 137 mmol/L (ref 135–145)

## 2015-12-08 LAB — CBC WITH DIFFERENTIAL/PLATELET
BASOS ABS: 0 10*3/uL (ref 0.0–0.1)
Basophils Relative: 0 %
Eosinophils Absolute: 0 10*3/uL (ref 0.0–0.7)
Eosinophils Relative: 1 %
HEMATOCRIT: 39.4 % (ref 39.0–52.0)
Hemoglobin: 12.4 g/dL — ABNORMAL LOW (ref 13.0–17.0)
LYMPHS ABS: 1.9 10*3/uL (ref 0.7–4.0)
LYMPHS PCT: 28 %
MCH: 29 pg (ref 26.0–34.0)
MCHC: 31.5 g/dL (ref 30.0–36.0)
MCV: 92.3 fL (ref 78.0–100.0)
MONO ABS: 0.5 10*3/uL (ref 0.1–1.0)
Monocytes Relative: 8 %
NEUTROS ABS: 4.3 10*3/uL (ref 1.7–7.7)
Neutrophils Relative %: 63 %
Platelets: 141 10*3/uL — ABNORMAL LOW (ref 150–400)
RBC: 4.27 MIL/uL (ref 4.22–5.81)
RDW: 16 % — AB (ref 11.5–15.5)
WBC: 6.7 10*3/uL (ref 4.0–10.5)

## 2015-12-08 LAB — URINALYSIS, ROUTINE W REFLEX MICROSCOPIC
Bilirubin Urine: NEGATIVE
GLUCOSE, UA: NEGATIVE mg/dL
HGB URINE DIPSTICK: NEGATIVE
KETONES UR: NEGATIVE mg/dL
Leukocytes, UA: NEGATIVE
Nitrite: NEGATIVE
PROTEIN: NEGATIVE mg/dL
Specific Gravity, Urine: 1.013 (ref 1.005–1.030)
pH: 6.5 (ref 5.0–8.0)

## 2015-12-08 LAB — BRAIN NATRIURETIC PEPTIDE
B NATRIURETIC PEPTIDE 5: 1374.7 pg/mL — AB (ref 0.0–100.0)
B NATRIURETIC PEPTIDE 5: 1647 pg/mL — AB (ref 0.0–100.0)

## 2015-12-08 LAB — PROTIME-INR
INR: 1.3 (ref 0.00–1.49)
Prothrombin Time: 16.3 seconds — ABNORMAL HIGH (ref 11.6–15.2)

## 2015-12-08 LAB — CBG MONITORING, ED: Glucose-Capillary: 90 mg/dL (ref 65–99)

## 2015-12-08 LAB — TROPONIN I: TROPONIN I: 0.29 ng/mL — AB (ref <0.031)

## 2015-12-08 MED ORDER — WARFARIN SODIUM 2 MG PO TABS
2.0000 mg | ORAL_TABLET | ORAL | Status: AC
  Administered 2015-12-08: 2 mg via ORAL
  Filled 2015-12-08: qty 1

## 2015-12-08 MED ORDER — ACETAMINOPHEN 500 MG PO TABS: 1000.0000 mg | ORAL_TABLET | Freq: Four times a day (QID) | ORAL | Status: DC | PRN

## 2015-12-08 MED ORDER — FINASTERIDE 5 MG PO TABS
5.0000 mg | ORAL_TABLET | Freq: Every day | ORAL | Status: DC
  Administered 2015-12-08 – 2015-12-11 (×4): 5 mg via ORAL
  Filled 2015-12-08 (×4): qty 1

## 2015-12-08 MED ORDER — AMIODARONE HCL 200 MG PO TABS
200.0000 mg | ORAL_TABLET | Freq: Every day | ORAL | Status: DC
  Administered 2015-12-09 – 2015-12-10 (×2): 200 mg via ORAL
  Filled 2015-12-08 (×2): qty 1

## 2015-12-08 MED ORDER — FLUTICASONE PROPIONATE 50 MCG/ACT NA SUSP
1.0000 | Freq: Every day | NASAL | Status: DC | PRN
  Filled 2015-12-08: qty 16

## 2015-12-08 MED ORDER — ASPIRIN EC 81 MG PO TBEC
81.0000 mg | DELAYED_RELEASE_TABLET | Freq: Every day | ORAL | Status: DC
  Administered 2015-12-08 – 2015-12-11 (×4): 81 mg via ORAL
  Filled 2015-12-08 (×4): qty 1

## 2015-12-08 MED ORDER — PRAVASTATIN SODIUM 40 MG PO TABS
40.0000 mg | ORAL_TABLET | Freq: Every day | ORAL | Status: DC
  Administered 2015-12-09 – 2015-12-10 (×2): 40 mg via ORAL
  Filled 2015-12-08 (×2): qty 1

## 2015-12-08 MED ORDER — TAMSULOSIN HCL 0.4 MG PO CAPS
0.4000 mg | ORAL_CAPSULE | Freq: Every day | ORAL | Status: DC
  Administered 2015-12-08 – 2015-12-11 (×4): 0.4 mg via ORAL
  Filled 2015-12-08 (×4): qty 1

## 2015-12-08 MED ORDER — FUROSEMIDE 10 MG/ML IJ SOLN
40.0000 mg | Freq: Once | INTRAMUSCULAR | Status: AC
  Administered 2015-12-08: 40 mg via INTRAVENOUS
  Filled 2015-12-08: qty 4

## 2015-12-08 MED ORDER — WARFARIN - PHARMACIST DOSING INPATIENT
Freq: Every day | Status: DC
  Administered 2015-12-10: 18:00:00

## 2015-12-08 MED ORDER — METOPROLOL TARTRATE 12.5 MG HALF TABLET
12.5000 mg | ORAL_TABLET | Freq: Two times a day (BID) | ORAL | Status: DC
  Administered 2015-12-09: 12.5 mg via ORAL
  Filled 2015-12-08: qty 1

## 2015-12-08 MED ORDER — POTASSIUM CHLORIDE CRYS ER 20 MEQ PO TBCR
20.0000 meq | EXTENDED_RELEASE_TABLET | Freq: Every day | ORAL | Status: DC
  Administered 2015-12-08 – 2015-12-11 (×4): 20 meq via ORAL
  Filled 2015-12-08 (×4): qty 1

## 2015-12-08 MED ORDER — SODIUM CHLORIDE 0.9% FLUSH
3.0000 mL | Freq: Two times a day (BID) | INTRAVENOUS | Status: DC
  Administered 2015-12-08 – 2015-12-10 (×5): 3 mL via INTRAVENOUS

## 2015-12-08 MED ORDER — FUROSEMIDE 40 MG PO TABS
40.0000 mg | ORAL_TABLET | Freq: Two times a day (BID) | ORAL | Status: DC
  Administered 2015-12-09 – 2015-12-10 (×3): 40 mg via ORAL
  Filled 2015-12-08 (×3): qty 1

## 2015-12-08 MED ORDER — TRAMADOL HCL 50 MG PO TABS
50.0000 mg | ORAL_TABLET | Freq: Four times a day (QID) | ORAL | Status: DC | PRN
  Administered 2015-12-09 – 2015-12-10 (×2): 50 mg via ORAL
  Filled 2015-12-08 (×2): qty 1

## 2015-12-08 NOTE — ED Provider Notes (Addendum)
CSN: [id number]     Arrival date & time 12/08/15  1256 History   First MD Initiated Contact with Patient 12/08/15 1319     Chief Complaint  Patient presents with  . Loss of Consciousness     (Consider location/radiation/quality/duration/timing/severity/associated sxs/prior Treatment) Patient is a 82 y.o. male presenting with syncope and dizziness.  Loss of Consciousness Associated symptoms: dizziness and nausea (last night, now resolved)   Associated symptoms: no chest pain, no fever, no headaches, no seizures, no shortness of breath, no vomiting and no weakness (generalized)   Dizziness Quality:  Lightheadedness Severity:  Severe Onset quality:  Sudden Duration:  1 hour Timing:  Intermittent Progression:  Waxing and waning Chronicity:  New Context comment:  Went to bathroom for first episode, sitting on bed Relieved by:  Nothing Worsened by:  Nothing Ineffective treatments:  None tried Associated symptoms: nausea (last night, now resolved)   Associated symptoms: no chest pain, no diarrhea, no headaches, no shortness of breath, no syncope (patient denies, wife reports "he was as limp as a dish rag"), no vomiting and no weakness (generalized)     Past Medical History  Diagnosis Date  . Coronary artery disease     a. 11/2014 NSTEMI: DESx 2 placed to LAD and RCA   . Mitral valve prolapse   . Diabetes mellitus, type II ([place])     a. HgA1c 6.8 in 03/2015  . Mitral regurgitation     a. severe wtih flail leaflet by 2D ECHO (06/05/15)  . SVT (supraventricular tachycardia) (HCC)     a. recurrent, usually responsive to vagal manuevers  . HTN (hypertension)   . Pulmonary HTN ([place])   . Lower extremity edema     a. chronic  . HLD (hyperlipidemia)   . Hypertension   . OA (osteoarthritis)   . ED (erectile dysfunction)   . Chronic fatigue   . Gout   . Spinal stenosis   . Prostate cancer ([place])   . Severe mitral regurgitation 11/17/2014    Mitral valve prolapse with flail segment of  anterior leaflet   . Chronic diastolic (congestive) heart failure ([place])   . Hemangioma of liver 08/02/2015    12 mm enhancing lesion seen on CT - suspicious for benign hemangioma but not diagnostic - MRI recommended  . Dysrhythmia   . Heart murmur   . Shortness of breath dyspnea   . Kidney stones   . Physical deconditioning   . S/P minimally invasive mitral valve repair 10/04/2015    Complex valvuloplasty including artificial Gore-tex neochord placement x6 with plication closure of cleft between P2 and P3 and 30 mm Sorin Memo 3D ring annuloplasty via right mini thoracotomy approach   . Hypertensive left ventricular hypertrophy with heart failure ([place]) 10/04/2015  . Hypertensive cardiomyopathy [facility])    Past Surgical History  Procedure Laterality Date  . Cardiac surgery      2 cardiac stents  . Joint replacement      shoulder  . Back surgery x 2    . Rt rotator cuff repair    . Cervical spine surgery    . Back surgery    . Left heart catheterization with coronary angiogram N/A 11/16/2014    Procedure: LEFT HEART CATHETERIZATION WITH CORONARY ANGIOGRAM;  Surgeon: Troy Sine, MD;  Location: [facility] CATH LAB;  Service: Cardiovascular;  Laterality: N/A;  . Tee without cardioversion N/A 06/22/2015    Procedure: TRANSESOPHAGEAL ECHOCARDIOGRAM (TEE);  Surgeon: Skeet Latch, MD;  Location: [place];  Service: Cardiovascular;  Laterality: N/A;  . Cardiac catheterization N/A 08/08/2015    Procedure: Right/Left Heart Cath and Coronary Angiography;  Surgeon: Sherren Mocha, MD;  Location: [place] CV LAB;  Service: Cardiovascular;  Laterality: N/A;  . Eye surgery    . Mitral valve repair Right 10/04/2015    Procedure: MINIMALLY INVASIVE MITRAL VALVE REPAIR (MVR);  Surgeon: Rexene Alberts, MD;  Location: [place];  Service: Open Heart Surgery;  Laterality: Right;  . Tee without cardioversion N/A 10/04/2015    Procedure: TRANSESOPHAGEAL ECHOCARDIOGRAM (TEE);  Surgeon: Rexene Alberts, MD;  Location:  [place];  Service: Open Heart Surgery;  Laterality: N/A;  . Wound exploration N/A 10/05/2015    Procedure: Sternal Incision;  Surgeon: Rexene Alberts, MD;  Location: [place];  Service: Open Heart Surgery;  Laterality: N/A;  . Intraoperative transesophageal echocardiogram  10/05/2015    Procedure: INTRAOPERATIVE TRANSESOPHAGEAL ECHOCARDIOGRAM;  Surgeon: Rexene Alberts, MD;  Location: [place];  Service: Open Heart Surgery;;  . Cardioversion N/A 10/20/2015    Procedure: CARDIOVERSION;  Surgeon: Pixie Casino, MD;  Location: [facility] ENDOSCOPY;  Service: Cardiovascular;  Laterality: N/A;   Family History  Problem Relation Age of Onset  . Heart failure Mother 43  . Heart attack Mother   . Heart attack Brother    Social History  Substance Use Topics  . Smoking status: Former Research scientist (life sciences)  . Smokeless tobacco: Never Used     Comment: QUIT SMOKING  MANY YEARS AGO"  . Alcohol Use: No    Review of Systems  Constitutional: Negative for fever and appetite change.  HENT: Negative for sore throat.   Eyes: Negative for visual disturbance.  Respiratory: Negative for shortness of breath.   Cardiovascular: Negative for chest pain and syncope (patient denies, wife reports "he was as limp as a dish rag").  Gastrointestinal: Positive for nausea (last night, now resolved). Negative for vomiting, abdominal pain and diarrhea.  Genitourinary: Negative for dysuria and difficulty urinating.  Musculoskeletal: Negative for back pain and neck stiffness.  Skin: Negative for rash.  Neurological: Positive for dizziness and light-headedness. Negative for seizures, syncope, facial asymmetry, weakness (generalized), numbness and headaches. Speech difficulty: speech seemed slurred at time of episodes, generalized weakness.      Allergies  Review of patient's allergies indicates no known allergies.  Home Medications   Prior to Admission medications   Medication Sig Start Date End Date Taking? Authorizing Provider  acetaminophen  (TYLENOL) 500 MG tablet Take 2 tablets (1,000 mg total) by mouth every 6 (six) hours as needed for mild pain or fever. 10/13/15  Yes Donielle Liston Alba, PA-C  amiodarone (PACERONE) 200 MG tablet Take 1 tablet (200 mg total) by mouth daily. 10/31/15  Yes [name], PA  aspirin EC 81 MG tablet Take 81 mg by mouth daily.   Yes Historical Provider, MD  colchicine 0.6 MG tablet Take 1 tablet (0.6 mg total) by mouth daily. 10/31/15  Yes [name], PA  finasteride (PROSCAR) 5 MG tablet TAKE ONE (1) TABLET EACH DAY 09/28/15  Yes [name], MD  fluticasone [facility]) 50 MCG/ACT nasal spray Place 1 spray into both nostrils daily as needed for allergies or rhinitis.   Yes Historical Provider, MD  furosemide (LASIX) 40 MG tablet Take 0.5 tablets (20 mg total) by mouth daily. Patient taking differently: Take 40 mg by mouth 2 (two) times daily.  10/31/15  Yes [name], PA  gabapentin (NEURONTIN) 100 MG capsule Take 1 capsule (100 mg  total) by mouth 3 (three) times daily. 12/01/15  Yes [name], MD  metoprolol tartrate (LOPRESSOR) 25 MG tablet Take 0.5 tablets (12.5 mg total) by mouth 2 (two) times daily. 10/31/15  Yes [name], PA  potassium chloride SA (K-DUR,KLOR-CON) 20 MEQ tablet Take 1 tablet (20 mEq total) by mouth daily. 10/31/15  Yes [name], PA  pravastatin (PRAVACHOL) 40 MG tablet Take 1 tablet (40 mg total) by mouth daily at 6 PM. Patient taking differently: Take 40 mg by mouth daily.  06/05/15  Yes Eileen Stanford, PA-C  tamsulosin (FLOMAX) 0.4 MG CAPS capsule Take 1 capsule (0.4 mg total) by mouth daily. 09/08/15  Yes [name], MD  traMADol (ULTRAM) 50 MG tablet Take 1 tablet (50 mg total) by mouth every 6 (six) hours as needed for moderate pain. 10/13/15  Yes Donielle Liston Alba, PA-C  warfarin (COUMADIN) 2 MG tablet Take 0.5 tablets (1 mg total) by mouth daily at 6 PM. Or as directed Patient taking differently: Take 1 mg by mouth  daily at 6 PM. 2 mg Monday- Friday. 1 mg all other days. 10/31/15  Yes [name], PA   BP 154/78 mmHg  Pulse 53  Temp(Src) 97.5 F (36.4 C) (Oral)  Resp 18  SpO2 99% Physical Exam  Constitutional: He is oriented to person, place, and time. He appears well-developed and well-nourished. No distress.  HENT:  Head: Normocephalic and atraumatic.  Eyes: Conjunctivae and EOM are normal.  Neck: Normal range of motion.  Cardiovascular: Normal rate, regular rhythm, normal heart sounds and intact distal pulses.  Exam reveals no gallop and no friction rub.   No murmur heard. Pulmonary/Chest: Effort normal and breath sounds normal. No respiratory distress. He has no wheezes. He has no rales.  Abdominal: Soft. He exhibits no distension. There is no tenderness. There is no guarding.  Musculoskeletal: He exhibits edema (3+ bilateral (chronic per pt)).  Neurological: He is alert and oriented to person, place, and time. He has normal strength. No cranial nerve deficit or sensory deficit. Coordination normal. GCS eye subscore is 4. GCS verbal subscore is 5. GCS motor subscore is 6.  Skin: Skin is warm and dry. He is not diaphoretic.  Nursing note and vitals reviewed.   ED Course  Procedures (including critical care time) Labs Review Labs Reviewed  BASIC METABOLIC PANEL - Abnormal; Notable for the following:    Glucose, Bld 102 (*)    Creatinine, Ser 1.38 (*)    Calcium 8.6 (*)    GFR calc non Af Amer 45 (*)    GFR calc Af Amer 53 (*)    All other components within normal limits  I-STAT TROPOININ, ED - Abnormal; Notable for the following:    Troponin i, poc 0.35 (*)    All other components within normal limits  URINALYSIS, ROUTINE W REFLEX MICROSCOPIC (NOT AT [facility])  CBC WITH DIFFERENTIAL/PLATELET  CBC WITH DIFFERENTIAL/PLATELET  PROTIME-INR  BRAIN NATRIURETIC PEPTIDE  CBG MONITORING, ED    Imaging Review Dg Chest 2 View  12/08/2015  CLINICAL DATA:  Syncopal episode yesterday.  Another syncopal episode today. EXAM: CHEST  2 VIEW COMPARISON:  11/21/2015 FINDINGS: Persistent densities at the right lung base compatible with pleural fluid. Again noted are linear densities in the right lower chest suggestive for atelectasis. Overall, the densities at the right lung base may have increased, particularly based on the lateral view. The left lung remains clear. Heart size is mildly enlarged but not significantly changed. Surgical  plate in the lower cervical spine. No evidence for pneumothorax. IMPRESSION: Increased densities at the right lung base. Findings are compatible with a combination of pleural fluid and atelectasis. Electronically Signed   By: Markus Daft M.D.   On: 12/08/2015 16:45   Ct Head Wo Contrast  12/08/2015  CLINICAL DATA:  Patient with syncopal episode and fall. Chronic fatigue. Shortness of breath. EXAM: CT HEAD WITHOUT CONTRAST TECHNIQUE: Contiguous axial images were obtained from the base of the skull through the vertex without intravenous contrast. COMPARISON:  Brain CT 10/18/2015 FINDINGS: Ventricles and sulci are normal. Periventricular and subcortical white matter hypodensity compatible with chronic microvascular ischemic changes. No evidence for acute cortically based infarct. There is a chronic right holo-hemispheric cerebral spinal fluid equivalent subdural hematoma measuring 14 mm, unchanged from prior. There is persistent mass effect on the sulci within the right cerebral hemisphere. No midline shift. The orbits are unremarkable. Mucosal thickening involving the paranasal sinuses. Mastoid air cells are well aerated. Calvarium is intact. IMPRESSION: Grossly unchanged chronic right holo-hemispheric subdural hematoma without midline shift. Chronic microvascular ischemic change. Electronically Signed   By: Lovey Newcomer M.D.   On: 12/08/2015 17:17   I have personally reviewed and evaluated these images and lab results as part of my medical decision-making.   EKG  Interpretation   Date/Time:  Friday Dec 08 2015 12:58:41 EDT Ventricular Rate:  62 PR Interval:  206 QRS Duration: 130 QT Interval:  536 QTC Calculation: 544 R Axis:   -56 Text Interpretation:  Sinus rhythm Probable left atrial enlargement  Nonspecific IVCD with LAD Left ventricular hypertrophy Anterior Q waves,  possibly due to LVH Since prior ECG, rate has decreased, patient is no  longer in atrial fibrillation, otherwise similar morphology Confirmed by  [facility] MD, [place] ([id number]) on 12/08/2015 1:22:50 PM      MDM   Final diagnoses:  Generalized weakness, episodes  Near syncope    82 year old male with a history of mitral valve prolapse to severe mitral regurgitation now s/p mitral valve repair on [id number] chronic diastolic congestive heart failure, coronary artery disease status post PCI and stenting, recurrent SVT, atrial fibrillation, pulmonary hypertension, type 2 diabetes, hyperlipidemia, chronic subdural hematoma on head CT, presents with concern for episodes of lightheadedness and fatigue.  Wife reports he had an episode last night lasting about 2 hours, he had severe generalized weakness, difficulty walking, and it appeared "out of it" and had an episode today which lasted approximately one hour, where he was very lightheaded, and generally weak. On EMS arrival, patient's heart rate was initially in the 30s, however improved to the 60s prior to arrival to the emergency department. In the emergency department, patient has been in normal sinus rhythm. Troponin is mildly elevated, however similar to prior values. Patient denies any chest pain or shortness of breath, and have low suspicion for ACS or pulmonary embolus. They report no change in his peripheral edema, no shortness of breath, no hypoxia, no pulmonary edema on x-ray, and doubt acute congestive heart failure. No sign of pneumonia. No urinary symptoms or fever and overall low suspicion for infectious etiology of symptoms.  Patient with chronic subdural on CT which is unchanged. History and exam not consistent with stroke or seizure. CBC WNL.  Given history of bradycardia with EMS, possible etiology of episodic generalized weakness may be arrhythmia or bradycardia, other etiologies may be medication effect.  Cardiology to admit under observation.     Gareth Morgan, MD 12/08/15 [phone]

## 2015-12-08 NOTE — ED Notes (Signed)
Per MD pt declines I&O, pt to have fluids & dinner bag & will attempt to collect urine

## 2015-12-08 NOTE — Progress Notes (Signed)
ANTICOAGULATION CONSULT NOTE - Initial Consult  Pharmacy Consult: Coumadin Indication: atrial fibrillation  No Known Allergies  Patient Measurements: Weight = 91.6 kg Height = 5'7"  Vital Signs: Temp: 97.5 F (36.4 C) (05/05 1618) Temp Source: Oral (05/05 1618) BP: 134/70 mmHg (05/05 2045) Pulse Rate: 58 (05/05 2045)  Labs:  Recent Labs  12/06/15 12/08/15 1426 12/08/15 1709 12/08/15 1745  HGB  --   --   --  12.4*  HCT  --   --   --  39.4  PLT  --   --   --  141*  LABPROT  --   --  16.3*  --   INR 1.2  --  1.30  --   CREATININE  --  1.38*  --   --     Estimated Creatinine Clearance: 43 mL/min (by C-G formula based on Cr of 1.38).   Medical History: Past Medical History  Diagnosis Date  . Coronary artery disease     a. 11/2014 NSTEMI: DESx 2 placed to LAD and RCA   . Mitral valve prolapse   . Diabetes mellitus, type II ([place])     a. HgA1c 6.8 in 03/2015  . Mitral regurgitation     a. severe wtih flail leaflet by 2D ECHO (06/05/15)  . SVT (supraventricular tachycardia) (HCC)     a. recurrent, usually responsive to vagal manuevers  . HTN (hypertension)   . Pulmonary HTN ([place])   . Lower extremity edema     a. chronic  . HLD (hyperlipidemia)   . Hypertension   . OA (osteoarthritis)   . ED (erectile dysfunction)   . Chronic fatigue   . Gout   . Spinal stenosis   . Prostate cancer ([place])   . Severe mitral regurgitation 11/17/2014    Mitral valve prolapse with flail segment of anterior leaflet   . Chronic diastolic (congestive) heart failure ([place])   . Hemangioma of liver 08/02/2015    12 mm enhancing lesion seen on CT - suspicious for benign hemangioma but not diagnostic - MRI recommended  . Dysrhythmia   . Heart murmur   . Shortness of breath dyspnea   . Kidney stones   . Physical deconditioning   . S/P minimally invasive mitral valve repair 10/04/2015    Complex valvuloplasty including artificial Gore-tex neochord placement x6 with plication closure of cleft  between P2 and P3 and 30 mm Sorin Memo 3D ring annuloplasty via right mini thoracotomy approach   . Hypertensive left ventricular hypertrophy with heart failure ([place]) 10/04/2015  . Hypertensive cardiomyopathy ([place])        Assessment: 77 YOM with history of Afib to continue on Coumadin.  Patient's home regimen was recently changed to 1mg  daily except except 2mg  on Mon and Fri due to an INR of 1.2 per [facility] visit note.  Patient's INR remains sub-therapeutic today.   Goal of Therapy:  INR 2-3    Plan:  - Coumadin 2mg  PO today - Daily PT / INR   Preciosa Bundrick D. Mina Marble, PharmD, BCPS Pager:  [phone] 12/08/2015, 10:00 PM

## 2015-12-08 NOTE — ED Notes (Signed)
Unable to collect labs, phlebotomy to collect

## 2015-12-08 NOTE — ED Notes (Signed)
Patient repositioned and warm blanket given.  Finished [place] sandwich and [employer]

## 2015-12-08 NOTE — ED Notes (Signed)
Patient continues to refuse in and out cath to obtain urine

## 2015-12-08 NOTE — H&P (Addendum)
History and Physical  Patient ID: [name] MRN: [id number], SOB: 1928/08/16 82 y.o. Date of Encounter: 12/08/2015, 5:40 PM  Primary Physician: [name], MD Primary Cardiologist: Dr Percival Spanish  Chief Complaint: dizziness  HPI: 82 y.o. male w/ complex cardiac history who presented to [facility] on 12/08/2015 with complaints of dizziness.  The patient is a poor historian. His wife just left the ER because she has a long drive home and only history obtainable is from the patient. I did get some additional history from the EDP, Dr Billy Fischer. Last night and again early this morning the patient was dizzy and weak. He denies frank syncope but states that he felt very weak. Wife apparently reported that he slumped over. He did not fall to the ground or injure himself. He is feeling back to normal now. When EMS arrived his heart rate was reported in the 30's but there are no strips available for review. HR on arrival to the ER is sinus in the 60's. The patient denies CP, dyspnea, fever/chills, orthopnea, or PND. He has chronic leg swelling with no recent change. He's sedentary but states he's able to walk without too much difficulty. He was recently started on gabapentin.   His cardiac history is complex. He has a hx of MVP with severe MR, CAD with PCI, atrial fibrillation, type II DM, and dynamic LV outflow obstruction. He underwent mitral valve repair [id number] complicated by hemodynamic instability in the early post-op period felt secondary to dynamic LV outflow obstruction and SAM. This resolved with volume and beta-blockade. He also had post-op atrial fibrillation treated with amiodarone. He was readmitted with heart failure and supratherapeutic INR with a fairly prolonged hospitalization of 2 weeks. At some point he had a subdural hematoma and was managed conservatively.   Overall he is feeling better since surgery. States his breathing is markedly improved.    Past Medical  History  Diagnosis Date  . Coronary artery disease     a. 11/2014 NSTEMI: DESx 2 placed to LAD and RCA   . Mitral valve prolapse   . Diabetes mellitus, type II ([place])     a. HgA1c 6.8 in 03/2015  . Mitral regurgitation     a. severe wtih flail leaflet by 2D ECHO (06/05/15)  . SVT (supraventricular tachycardia) (H[facility])     a. recurrent, usually responsive to vagal manuevers  . HTN (hypertension)   . Pulmonary HTN ([place])   . Lower extremity edema     a. chronic  . HLD (hyperlipidemia)   . Hypertension   . OA (osteoarthritis)   . ED (erectile dysfunction)   . Chronic fatigue   . Gout   . Spinal stenosis   . Prostate cancer ([place])   . Severe mitral regurgitation 11/17/2014    Mitral valve prolapse with flail segment of anterior leaflet   . Chronic diastolic (congestive) heart failure ([place])   . Hemangioma of liver 08/02/2015    12 mm enhancing lesion seen on CT - suspicious for benign hemangioma but not diagnostic - MRI recommended  . Dysrhythmia   . Heart murmur   . Shortness of breath dyspnea   . Kidney stones   . Physical deconditioning   . S/P minimally invasive mitral valve repair 10/04/2015    Complex valvuloplasty including artificial Gore-tex neochord placement x6 with plication closure of cleft between P2 and P3 and 30 mm Sorin Memo 3D ring annuloplasty via right mini thoracotomy approach   . Hypertensive left  ventricular hypertrophy with heart failure ([place]) 10/04/2015  . Hypertensive cardiomyopathy [facility])      Surgical History:  Past Surgical History  Procedure Laterality Date  . Cardiac surgery      2 cardiac stents  . Joint replacement      shoulder  . Back surgery x 2    . Rt rotator cuff repair    . Cervical spine surgery    . Back surgery    . Left heart catheterization with coronary angiogram N/A 11/16/2014    Procedure: LEFT HEART CATHETERIZATION WITH CORONARY ANGIOGRAM;  Surgeon: Troy Sine, MD;  Location: [facility] CATH LAB;  Service: Cardiovascular;  Laterality: N/A;    . Tee without cardioversion N/A 06/22/2015    Procedure: TRANSESOPHAGEAL ECHOCARDIOGRAM (TEE);  Surgeon: Skeet Latch, MD;  Location: [place];  Service: Cardiovascular;  Laterality: N/A;  . Cardiac catheterization N/A 08/08/2015    Procedure: Right/Left Heart Cath and Coronary Angiography;  Surgeon: Sherren Mocha, MD;  Location: [place] CV LAB;  Service: Cardiovascular;  Laterality: N/A;  . Eye surgery    . Mitral valve repair Right 10/04/2015    Procedure: MINIMALLY INVASIVE MITRAL VALVE REPAIR (MVR);  Surgeon: Rexene Alberts, MD;  Location: [place];  Service: Open Heart Surgery;  Laterality: Right;  . Tee without cardioversion N/A 10/04/2015    Procedure: TRANSESOPHAGEAL ECHOCARDIOGRAM (TEE);  Surgeon: Rexene Alberts, MD;  Location: [place];  Service: Open Heart Surgery;  Laterality: N/A;  . Wound exploration N/A 10/05/2015    Procedure: Sternal Incision;  Surgeon: Rexene Alberts, MD;  Location: [place];  Service: Open Heart Surgery;  Laterality: N/A;  . Intraoperative transesophageal echocardiogram  10/05/2015    Procedure: INTRAOPERATIVE TRANSESOPHAGEAL ECHOCARDIOGRAM;  Surgeon: Rexene Alberts, MD;  Location: [place];  Service: Open Heart Surgery;;  . Cardioversion N/A 10/20/2015    Procedure: CARDIOVERSION;  Surgeon: Pixie Casino, MD;  Location: [facility] ENDOSCOPY;  Service: Cardiovascular;  Laterality: N/A;     Home Meds: Prior to Admission medications   Medication Sig Start Date End Date Taking? Authorizing Provider  acetaminophen (TYLENOL) 500 MG tablet Take 2 tablets (1,000 mg total) by mouth every 6 (six) hours as needed for mild pain or fever. 10/13/15  Yes Donielle Liston Alba, PA-C  amiodarone (PACERONE) 200 MG tablet Take 1 tablet (200 mg total) by mouth daily. 10/31/15  Yes [name], PA  aspirin EC 81 MG tablet Take 81 mg by mouth daily.   Yes Historical Provider, MD  colchicine 0.6 MG tablet Take 1 tablet (0.6 mg total) by mouth daily. 10/31/15  Yes [name], PA   finasteride (PROSCAR) 5 MG tablet TAKE ONE (1) TABLET EACH DAY 09/28/15  Yes [name], MD  fluticasone [facility]) 50 MCG/ACT nasal spray Place 1 spray into both nostrils daily as needed for allergies or rhinitis.   Yes Historical Provider, MD  furosemide (LASIX) 40 MG tablet Take 0.5 tablets (20 mg total) by mouth daily. Patient taking differently: Take 40 mg by mouth 2 (two) times daily.  10/31/15  Yes [name], PA  gabapentin (NEURONTIN) 100 MG capsule Take 1 capsule (100 mg total) by mouth 3 (three) times daily. 12/01/15  Yes [name], MD  metoprolol tartrate (LOPRESSOR) 25 MG tablet Take 0.5 tablets (12.5 mg total) by mouth 2 (two) times daily. 10/31/15  Yes [name], PA  potassium chloride SA (K-DUR,KLOR-CON) 20 MEQ tablet Take 1 tablet (20 mEq total) by mouth daily. 10/31/15  Yes Bhavinkumar  Bhagat, PA  pravastatin (PRAVACHOL) 40 MG tablet Take 1 tablet (40 mg total) by mouth daily at 6 PM. Patient taking differently: Take 40 mg by mouth daily.  06/05/15  Yes Eileen Stanford, PA-C  tamsulosin (FLOMAX) 0.4 MG CAPS capsule Take 1 capsule (0.4 mg total) by mouth daily. 09/08/15  Yes [name], MD  traMADol (ULTRAM) 50 MG tablet Take 1 tablet (50 mg total) by mouth every 6 (six) hours as needed for moderate pain. 10/13/15  Yes Donielle Liston Alba, PA-C  warfarin (COUMADIN) 2 MG tablet Take 0.5 tablets (1 mg total) by mouth daily at 6 PM. Or as directed Patient taking differently: Take 1 mg by mouth daily at 6 PM. 2 mg Monday- Friday. 1 mg all other days. 10/31/15  Yes Leanor Kail, PA    Allergies: No Known Allergies  Social History   Social History  . Marital Status: Married    Spouse Name: N/A  . Number of Children: 4  . Years of Education: N/A   Occupational History  .     Social History Main Topics  . Smoking status: Former Research scientist (life sciences)  . Smokeless tobacco: Never Used     Comment: QUIT SMOKING  MANY YEARS AGO"  . Alcohol Use: No  . Drug  Use: No  . Sexual Activity: Not on file   Other Topics Concern  . Not on file   Social History Narrative   ** Merged History Encounter **       Four adopted children.  Lives alone.      Family History  Problem Relation Age of Onset  . Heart failure Mother 109  . Heart attack Mother   . Heart attack Brother     Review of Systems: Left leg pain, otherwise see HPI - otherwise negative  Physical Exam: Blood pressure 154/78, pulse 53, temperature 97.5 F (36.4 C), temperature source Oral, resp. rate 18, SpO2 99 %. General: Elderly male, alert and oriented, in no acute distress. HEENT: Normocephalic, atraumatic, sclera anicteric Neck: Supple. Carotids 2+ without bruits. JVP moderately elevated Lungs: diminished in the bases but without wheezes, rales, or rhonchi. Breathing is unlabored. Heart: RRR with normal S1 and S2. No murmurs, rubs, or gallops appreciated. Abdomen: Soft, non-tender, non-distended with normoactive bowel sounds. No hepatomegaly. No rebound/guarding. No obvious abdominal masses. Back: No CVA tenderness Msk:  Strength and tone appear normal for age. Extremities: 1+ bilateral edema.  Distal pedal pulses are 2+ and equal bilaterally. Neuro: CNII-XII intact, moves all extremities spontaneously. Psych:  Responds to questions appropriately with a normal affect. Skin: warm and dry without rash   Labs:   Lab Results  Component Value Date   WBC 7.4 10/31/2015   HGB 9.7* 10/31/2015   HCT 32.6* 10/31/2015   MCV 95.0 10/31/2015   PLT 201 10/31/2015    Recent Labs Lab 12/08/15 1426  NA 137  K 4.2  CL 101  CO2 26  BUN 16  CREATININE 1.38*  CALCIUM 8.6*  GLUCOSE 102*   No results for input(s): CKTOTAL, CKMB, TROPONINI in the last 72 hours. Lab Results  Component Value Date   CHOL 158 06/05/2015   HDL 31* 06/05/2015   LDLCALC 80 06/05/2015   TRIG 235* 06/05/2015   No results found for: DDIMER  Radiology/Studies:  Dg Chest 2 View  12/08/2015  CLINICAL  DATA:  Syncopal episode yesterday. Another syncopal episode today. EXAM: CHEST  2 VIEW COMPARISON:  11/21/2015 FINDINGS: Persistent densities at the right lung base  compatible with pleural fluid. Again noted are linear densities in the right lower chest suggestive for atelectasis. Overall, the densities at the right lung base may have increased, particularly based on the lateral view. The left lung remains clear. Heart size is mildly enlarged but not significantly changed. Surgical plate in the lower cervical spine. No evidence for pneumothorax. IMPRESSION: Increased densities at the right lung base. Findings are compatible with a combination of pleural fluid and atelectasis. Electronically Signed   By: Markus Daft M.D.   On: 12/08/2015 16:45   Dg Chest 2 View  11/21/2015  CLINICAL DATA:  Patient underwent cardioversion on October 05, 2015 and mitral valve repair on October 04, 2015 ; the patient reports shortness of breath but no other complaints. EXAM: CHEST  2 VIEW COMPARISON:  PA and lateral chest x-ray of October 25, 2015 FINDINGS: There remains a small right pleural effusion. The pulmonary interstitium at the right lung base has partially cleared. The left lung is clear. The cardiac silhouette remains enlarged. The pulmonary vascularity is less engorged. The mediastinum is normal in width. There are degenerative changes of both shoulders and AC joints. IMPRESSION: Improved appearance of the chest. Persistent small right pleural effusion. Improving right basilar atelectasis or pneumonia. Decreased pulmonary interstitial edema. Stable cardiomegaly. Electronically Signed   By: David  Martinique M.D.   On: 11/21/2015 16:14   Ct Head Wo Contrast  12/08/2015  CLINICAL DATA:  Patient with syncopal episode and fall. Chronic fatigue. Shortness of breath. EXAM: CT HEAD WITHOUT CONTRAST TECHNIQUE: Contiguous axial images were obtained from the base of the skull through the vertex without intravenous contrast. COMPARISON:  Brain  CT 10/18/2015 FINDINGS: Ventricles and sulci are normal. Periventricular and subcortical white matter hypodensity compatible with chronic microvascular ischemic changes. No evidence for acute cortically based infarct. There is a chronic right holo-hemispheric cerebral spinal fluid equivalent subdural hematoma measuring 14 mm, unchanged from prior. There is persistent mass effect on the sulci within the right cerebral hemisphere. No midline shift. The orbits are unremarkable. Mucosal thickening involving the paranasal sinuses. Mastoid air cells are well aerated. Calvarium is intact. IMPRESSION: Grossly unchanged chronic right holo-hemispheric subdural hematoma without midline shift. Chronic microvascular ischemic change. Electronically Signed   By: Lovey Newcomer M.D.   On: 12/08/2015 17:17     EKG: NSR, LAFB, no significant ST changes  CARDIAC STUDIES: 2D Echo: Study Conclusions  - Left ventricle: The cavity size was normal. There was severe  focla hypertrophy of the basal septum and mild hypertrophy of the  posterior wall. Systolic function was normal. The estimated  ejection fraction was in the range of 55% to 60%. The study is  not technically sufficient to allow evaluation of LV diastolic  function. - Regional wall motion abnormality: Hypokinesis of the basal-mid  anteroseptal and basal-mid inferoseptal myocardium. - Aortic valve: There was mild stenosis. There was trivial  regurgitation. Peak velocity (S): 284 cm/s. Mean gradient (S): 16  mm Hg. - Mitral valve: Prior procedures included surgical repair with a 30  mm Sorin Memo 3D annuloplasty ring. The sewing ring had no  rocking motion and showed no evidence of dehiscence. The findings  are consistent with moderate stenosis. Mean gradient (D): 6 mm  Hg. Gradient relatively unchanged from prior. - Left atrium: The atrium was severely dilated. - Right ventricle: Systolic function was mildly reduced. - Right atrium: The atrium  was moderately dilated. - Atrial septum: No defect or patent foramen ovale was identified  by color  flow Doppler. - Tricuspid valve: There was mild-moderate regurgitation. - Pulmonic valve: There was mild regurgitation. - Pulmonary arteries: Systolic pressure was moderately to severely  increased. PA peak pressure: 47 mm Hg (S). - Inferior vena cava: The vessel was dilated. The respirophasic  diameter changes were blunted (< 50%), consistent with elevated  central venous pressure. - Pericardium, extracardiac: A trivial pericardial effusion was  identified.  Tele: sinus rhythm  ASSESSMENT AND PLAN:  1. Dizziness/Weakness: unclear etiology. Broad differential dx in this fairly debilitated gentleman. He does not appear acutely ill and his heart rhythm appears stable. I've reviewed his CXR and Head CT and both are without acute findings. BP appears stable. Diff dx includes bradyarrhythmia, side effect from gabapentin, UTI.   Plan: observe on tele, check UA, hold gabapentin, orthostatic vitals, repeat labs in am.  2. Mitral valve disease s/p MV repair: intact repair by post-op echo.   3. CAD - s/p PCI: minimal troponin elevation by POC troponin. No CP, EKG changes, or signs of ischemia  4. DM: cover with SSI while here  5. Acute on chronic diastolic heart failure: while he doesn't c/o shortness of breath, BNP is much more elevated past values, and exam/CXR suggest volume excess. Will give single dose IV lasix today and follow labs in am.  Dispo: overnight observation, if no arrhythmia or further symptoms anticipate discharge home tomorrow  Signed, Sherren Mocha MD 12/08/2015, 5:40 PM

## 2015-12-08 NOTE — ED Notes (Addendum)
Pt in from home via [facility], per report pt had syncopal episode yesterday lasting estimated 5 mins, pt had another syncopal episode today lasting 5 mins witihout hitting head, pt had CABG last mth here with cardiac hx, pt noted to have HR 34 upon EMS arrival, pt HR increased to 64 upon arrival to ED, pt A&O x4, denies SOB,  N/v/d & lightheadedness, CBG 102, pt rcvd 324 mg ASA

## 2015-12-09 ENCOUNTER — Encounter (HOSPITAL_COMMUNITY): Payer: Self-pay

## 2015-12-09 DIAGNOSIS — I480 Paroxysmal atrial fibrillation: Secondary | ICD-10-CM | POA: Diagnosis present

## 2015-12-09 DIAGNOSIS — R531 Weakness: Secondary | ICD-10-CM

## 2015-12-09 LAB — TROPONIN I
TROPONIN I: 0.27 ng/mL — AB (ref <0.031)
TROPONIN I: 0.27 ng/mL — AB (ref <0.031)

## 2015-12-09 LAB — BASIC METABOLIC PANEL
Anion gap: 11 (ref 5–15)
BUN: 15 mg/dL (ref 6–20)
CALCIUM: 8.5 mg/dL — AB (ref 8.9–10.3)
CHLORIDE: 96 mmol/L — AB (ref 101–111)
CO2: 30 mmol/L (ref 22–32)
CREATININE: 1.24 mg/dL (ref 0.61–1.24)
GFR calc non Af Amer: 52 mL/min — ABNORMAL LOW (ref >60)
GFR, EST AFRICAN AMERICAN: 60 mL/min — AB (ref >60)
Glucose, Bld: 99 mg/dL (ref 65–99)
Potassium: 4.4 mmol/L (ref 3.5–5.1)
Sodium: 137 mmol/L (ref 135–145)

## 2015-12-09 LAB — CBC
HCT: 36.3 % — ABNORMAL LOW (ref 39.0–52.0)
Hemoglobin: 11.3 g/dL — ABNORMAL LOW (ref 13.0–17.0)
MCH: 28.3 pg (ref 26.0–34.0)
MCHC: 31.1 g/dL (ref 30.0–36.0)
MCV: 90.8 fL (ref 78.0–100.0)
PLATELETS: 162 10*3/uL (ref 150–400)
RBC: 4 MIL/uL — AB (ref 4.22–5.81)
RDW: 15.9 % — AB (ref 11.5–15.5)
WBC: 6.8 10*3/uL (ref 4.0–10.5)

## 2015-12-09 LAB — PROTIME-INR
INR: 1.29 (ref 0.00–1.49)
PROTHROMBIN TIME: 16.2 s — AB (ref 11.6–15.2)

## 2015-12-09 LAB — TSH: TSH: 1.264 u[IU]/mL (ref 0.350–4.500)

## 2015-12-09 MED ORDER — FUROSEMIDE 40 MG PO TABS: 40.0000 mg | ORAL_TABLET | Freq: Two times a day (BID) | ORAL | Status: DC

## 2015-12-09 MED ORDER — WARFARIN SODIUM 3 MG PO TABS
3.0000 mg | ORAL_TABLET | Freq: Once | ORAL | Status: AC
  Administered 2015-12-09: 3 mg via ORAL
  Filled 2015-12-09: qty 1

## 2015-12-09 NOTE — Progress Notes (Signed)
Patient Name: [name] Date of Encounter: 12/09/2015     Active Problems:   Syncope    SUBJECTIVE  No CP or SOB. Told he could go home today.   CURRENT MEDS . amiodarone  200 mg Oral Daily  . aspirin EC  81 mg Oral Daily  . finasteride  5 mg Oral Daily  . furosemide  40 mg Oral BID  . metoprolol tartrate  12.5 mg Oral BID  . potassium chloride SA  20 mEq Oral Daily  . pravastatin  40 mg Oral q1800  . sodium chloride flush  3 mL Intravenous Q12H  . tamsulosin  0.4 mg Oral Daily  . warfarin  3 mg Oral ONCE-1800  . Warfarin - Pharmacist Dosing Inpatient   Does not apply q1800    OBJECTIVE  Filed Vitals:   12/08/15 2100 12/08/15 2145 12/08/15 2201 12/09/15 0454  BP: 135/67 137/79  139/70  Pulse: 58 66  63  Temp:  97.5 F (36.4 C)  97.5 F (36.4 C)  TempSrc:  Oral  Oral  Resp: 12 18  16   Height:   5\' 10"  (1.778 m)   Weight:   203 lb 3.2 oz (92.171 kg) 202 lb 9.6 oz (91.899 kg)  SpO2: 97% 100%  97%    Intake/Output Summary (Last 24 hours) at 12/09/15 1057 Last data filed at 12/09/15 0847  Gross per 24 hour  Intake    580 ml  Output   1075 ml  Net   -495 ml   Filed Weights   12/08/15 2201 12/09/15 0454  Weight: 203 lb 3.2 oz (92.171 kg) 202 lb 9.6 oz (91.899 kg)    PHYSICAL EXAM  General: Pleasant, NAD. Chronically ill appearing.  Neuro: Alert and oriented X 3. Moves all extremities spontaneously. Psych: Normal affect. HEENT:  Normal  Neck: Supple without bruits or JVD. Lungs:  Resp regular and unlabored, CTA. Heart: RRR no s3, s4, or + murmurs. Abdomen: Soft, non-tender, non-distended, BS + x 4.  Extremities: No clubbing, cyanosis or 2+ pitting bilateral edema. DP/PT/Radials 2+ and equal bilaterally.  Accessory Clinical Findings  CBC  Recent Labs  12/08/15 1745 12/09/15 0409  WBC 6.7 6.8  NEUTROABS 4.3  --   HGB 12.4* 11.3*  HCT 39.4 36.3*  MCV 92.3 90.8  PLT 141* 0000000   Basic Metabolic Panel  Recent Labs  12/08/15 1426  12/09/15 0409  NA 137 137  K 4.2 4.4  CL 101 96*  CO2 26 30  GLUCOSE 102* 99  BUN 16 15  CREATININE 1.38* 1.24  CALCIUM 8.6* 8.5*   Liver Function Tests No results for input(s): AST, ALT, ALKPHOS, BILITOT, PROT, ALBUMIN in the last 72 hours. No results for input(s): LIPASE, AMYLASE in the last 72 hours. Cardiac Enzymes  Recent Labs  12/08/15 2206 12/09/15 0409 12/09/15 0916  TROPONINI 0.29* 0.27* 0.27*   Thyroid Function Tests  Recent Labs  12/08/15 2221  TSH 1.264    TELE  Some bradycardia overnight with HRs in 40s.   Radiology/Studies  Dg Chest 2 View  12/08/2015  CLINICAL DATA:  Syncopal episode yesterday. Another syncopal episode today. EXAM: CHEST  2 VIEW COMPARISON:  11/21/2015 FINDINGS: Persistent densities at the right lung base compatible with pleural fluid. Again noted are linear densities in the right lower chest suggestive for atelectasis. Overall, the densities at the right lung base may have increased, particularly based on the lateral view. The left lung remains clear. Heart size is mildly enlarged  but not significantly changed. Surgical plate in the lower cervical spine. No evidence for pneumothorax. IMPRESSION: Increased densities at the right lung base. Findings are compatible with a combination of pleural fluid and atelectasis. Electronically Signed   By: Markus Daft M.D.   On: 12/08/2015 16:45   Dg Chest 2 View  11/21/2015  CLINICAL DATA:  Patient underwent cardioversion on October 05, 2015 and mitral valve repair on October 04, 2015 ; the patient reports shortness of breath but no other complaints. EXAM: CHEST  2 VIEW COMPARISON:  PA and lateral chest x-ray of October 25, 2015 FINDINGS: There remains a small right pleural effusion. The pulmonary interstitium at the right lung base has partially cleared. The left lung is clear. The cardiac silhouette remains enlarged. The pulmonary vascularity is less engorged. The mediastinum is normal in width. There are  degenerative changes of both shoulders and AC joints. IMPRESSION: Improved appearance of the chest. Persistent small right pleural effusion. Improving right basilar atelectasis or pneumonia. Decreased pulmonary interstitial edema. Stable cardiomegaly. Electronically Signed   By: David  Martinique M.D.   On: 11/21/2015 16:14   Ct Head Wo Contrast  12/08/2015  CLINICAL DATA:  Patient with syncopal episode and fall. Chronic fatigue. Shortness of breath. EXAM: CT HEAD WITHOUT CONTRAST TECHNIQUE: Contiguous axial images were obtained from the base of the skull through the vertex without intravenous contrast. COMPARISON:  Brain CT 10/18/2015 FINDINGS: Ventricles and sulci are normal. Periventricular and subcortical white matter hypodensity compatible with chronic microvascular ischemic changes. No evidence for acute cortically based infarct. There is a chronic right holo-hemispheric cerebral spinal fluid equivalent subdural hematoma measuring 14 mm, unchanged from prior. There is persistent mass effect on the sulci within the right cerebral hemisphere. No midline shift. The orbits are unremarkable. Mucosal thickening involving the paranasal sinuses. Mastoid air cells are well aerated. Calvarium is intact. IMPRESSION: Grossly unchanged chronic right holo-hemispheric subdural hematoma without midline shift. Chronic microvascular ischemic change. Electronically Signed   By: Lovey Newcomer M.D.   On: 12/08/2015 17:17    ASSESSMENT AND PLAN  [name] is a 82 y.o. male with a history of CAD s/p DES to LAD and RCA, atrial fibrillation, severe MR with flail leaflet s/p minimally invasive MV repair (10/2015), HTN, type II DM, dynamic LV outflow obstruction, chronic LE edema, subdural hematoma and chronic debilitation who presented to [facility] ED on 12/08/15 with dizziness and weakness.   Dizziness/Weakness: unclear etiology. Broad differential dx in this fairly debilitated gentleman. He does not appear acutely ill and his heart  rhythm appears stable. CXR and Head CT and both are without acute findings. BP stable. He was recently started on gabapentin. Also his HR was noted to be in the 30s upon EMS arrival although no strips for this. Gabapentin held on admission. He did have some bradycardia overnight with HRs in 50s.   Mitral valve disease s/p MV repair: intact repair by post-op echo.   CAD - s/p PCI: minimal troponin elevation with flat trend. No CP, EKG changes, or signs of ischemia  DM: continue home regimen   Acute on chronic diastolic heart failure: while he doesn't c/o shortness of breath, he had chronic LE edema but BNP was much more elevated past values (1647), and exam/CXR suggest volume excess. He was given 1 dose of IV lasix. Creat improved 1.38--> 1.24. Net neg ~448mL.   Dispo: likely home today with outpatient follow up in 1-2 weeks. May need more than 20mg  Lasix daily at home.  Judy Pimple PA-C  Pager [phone]  Patient seen and discussed with PA Grandville Silos, I agree with her documentation. Admitted with generalized weakness and dizziness. Medical workup overall unremarkable. He has chronically elevated troponins over the last year from lab review, overall flat by laboratory trop I tests. EKG without ischemic changes, occasional sinus pause without significant bradycardia. CT head without acute changes.  He reported starting gabapenint 12/01/15, shortly after that family reports he started having intermittent weakness episodes. Suspect medication side effect, he will stop gabapentin and follow symptoms, follow up with his pcp as this does not appear to be a cardiac issue. If symptoms persist of gabapentin can consider outpatient monitor.  Some volume overload, he will take lasix 40mg  bid at home.   Zandra Abts MD

## 2015-12-09 NOTE — Progress Notes (Addendum)
Code Blue called to room at 2140. Upon my arrival with Code Team Code blue canceled.  Pt awake oriented x 4, HR 50-60, BP  137/79, Po2 100% on [place], denies CP or dizziness. Per floor RN Ray, Pt lost consciousness for about one minute with HR SB in 40s EKG completed and reviewed per Resident MD with Code Team. Ray Rn to updated Cardiologist and notify myself and Provider for worsening changes.

## 2015-12-09 NOTE — Progress Notes (Signed)
ANTICOAGULATION CONSULT NOTE - Follow Up Consult  Pharmacy Consult for coumadin Indication: atrial fibrillation  No Known Allergies  Patient Measurements: Height: 5\' 10"  (177.8 cm) Weight: 202 lb 9.6 oz (91.899 kg) (bedscale) IBW/kg (Calculated) : 73  Vital Signs: Temp: 97.5 F (36.4 C) (05/06 0454) Temp Source: Oral (05/06 0454) BP: 139/70 mmHg (05/06 0454) Pulse Rate: 63 (05/06 0454)  Labs:  Recent Labs  12/08/15 1426 12/08/15 1709 12/08/15 1745 12/08/15 2206 12/09/15 0409 12/09/15 0916  HGB  --   --  12.4*  --  11.3*  --   HCT  --   --  39.4  --  36.3*  --   PLT  --   --  141*  --  162  --   LABPROT  --  16.3*  --   --  16.2*  --   INR  --  1.30  --   --  1.29  --   CREATININE 1.38*  --   --   --  1.24  --   TROPONINI  --   --   --  0.29* 0.27* 0.27*    Estimated Creatinine Clearance: 50.6 mL/min (by C-G formula based on Cr of 1.24).   Assessment: 82 yo m with history of Afib to continue on Coumadin.  Patient's home regimen was recently changed to 1mg  daily except except 2mg  on Mon and Fri due to an INR of 1.2 per [facility] visit note.  Patient's INR remains sub-therapeutic today at 1.29. CBC stable - hgb 11.2, plts 162. No issues per RN.  Will bump up dose slightly tonight to try and get him in range.   Goal of Therapy:  INR 2-3 Monitor platelets by anticoagulation protocol: Yes   Plan:  - Coumadin 3 mg PO x 1 tonight - F/u INR in AM - Monitor s/s of bleeding  Dhiren Azimi L. Nicole Kindred, PharmD PGY2 Infectious Diseases Pharmacy Resident Pager: [phone] 12/09/2015 10:51 AM

## 2015-12-09 NOTE — Progress Notes (Addendum)
Patient given discharge instructions with wife present.  As I was exiting the room to secure a wheelchair, the wife called me back to the room.  Patient was having generalized myclonic jerking movements while sitting up in recliner.  LOC noted.  Episode lasted approximately 40 seconds.    B/P =  122/72 (83) and pulse was 43   Danna Hefty, NP notified and discharge put on hold.

## 2015-12-09 NOTE — Progress Notes (Signed)
Pt stated that when the nurse tech stood him up to weigh him she dropped the monitor box on his left foot and it hit the left outer three toes and hurts when the blankets touch his toes. No visible signs of trauma or injury.

## 2015-12-09 NOTE — Discharge Summary (Deleted)
Discharge Summary    Patient ID: [name],  MRN: [id number], DOB/AGE: 80-Nov-1930 80 y.o.  Admit date: 12/08/2015 Discharge date: 12/09/2015  Primary Care Provider: [facility] Primary Cardiologist: Dr. Percival Spanish    Discharge Diagnoses    Principal Problem:   Weakness Active Problems:   Hypertension   Gout   Dyslipidemia   Diabetes mellitus, type II ([place])   Pulmonary HTN ([place])   Lower extremity edema   S/P minimally invasive mitral valve repair   PAF (paroxysmal atrial fibrillation) (HCC)   CAD (coronary artery disease)   Allergies No Known Allergies   History of Present Illness     [name] is a 82 y.o. male with a history of CAD s/p DES to LAD and RCA (11/2014), atrial fibrillation, severe MR with flail leaflet s/p minimally invasive MV repair (10/2015), HTN, type II DM, dynamic LV outflow obstruction, chronic LE edema, subdural hematoma and chronic debilitation who presented to [facility] ED on 12/08/15 with dizziness and weakness.   He underwent mitral valve repair [id number] complicated by hemodynamic instability in the early post-op period felt secondary to dynamic LV outflow obstruction and SAM. This resolved with volume and beta-blockade. He also had post-op atrial fibrillation treated with amiodarone. He was readmitted with heart failure and supratherapeutic INR with a fairly prolonged hospitalization of 2 weeks. At some point he had a subdural hematoma and was managed conservatively.   The patient is a poor historian. The patient presented with dizziness and weakness. He denied frank syncope but stated that he felt very weak. Wife apparently reported that he slumped over. He did not fall to the ground or injure himself. When EMS arrived his heart rate was reported in the 30's but there were no strips available for review. HR on arrival to the ER was sinus in the 60's. The patient denied CP, dyspnea, fever/chills, orthopnea, or PND. He has chronic leg swelling  with no recent change. He's sedentary but he's able to walk without too much difficulty. He was recently started on gabapentin.     Hospital Course     Consultants: none   Dizziness/Weakness: unclear etiology. Broad differential dx in this fairly debilitated gentleman. He does not appear acutely ill and his heart rhythm appears stable. CXR and Head CT and both are without acute findings. BP stable. He reported starting gabapenint 12/01/15, shortly after that family reports he started having intermittent weakness episodes. Suspect medication side effect, he will stop gabapentin and follow symptoms, follow up with his pcp as this does not appear to be a cardiac issue. Gabapentin held on admission and will be held at discharge. If symptoms persist off gabapentin, can consider outpatient monitor. Also his HR was noted to be in the 30s upon EMS arrival although no strips for this. He did have some bradycardia overnight with HRs in 40s with some sinus pauses  Mitral valve disease s/p MV repair: intact repair by post-op echo.   CAD - s/p PCI: minimal troponin elevation with flat trend. Chronically elevated since 11/2014 when looking through records. No CP, EKG changes, or signs of ischemia  DM: continue home regimen   Acute on chronic diastolic heart failure: while he didn't c/o shortness of breath and has chronic LE edema, BNP was much more elevated past values (1647), and exam/CXR suggest volume excess. He was given 1 dose of IV lasix. Creat improved 1.38--> 1.24. Net neg ~476mL. Will discharge on lasix 40mg  BID.   PAF: on coumadin  for CHADSVASC of at least 6 (CHF, HTN, Age, DM, CAD). Currently in sinus. INR subtheraptuic. Continue amiodarone.  The patient has had an uncomplicated hospital course and is recovering well. He has been seen by Dr. Harl Bowie today and deemed ready for discharge home. A staff message for follow-up appointments has been sent.  Discharge medications are listed  below.  _____________  Discharge Vitals Blood pressure 139/70, pulse 63, temperature 97.5 F (36.4 C), temperature source Oral, resp. rate 16, height 5\' 10"  (1.778 m), weight 202 lb 9.6 oz (91.899 kg), SpO2 97 %.  Filed Weights   12/08/15 2201 12/09/15 0454  Weight: 203 lb 3.2 oz (92.171 kg) 202 lb 9.6 oz (91.899 kg)    Labs & Radiologic Studies     CBC  Recent Labs  12/08/15 1745 12/09/15 0409  WBC 6.7 6.8  NEUTROABS 4.3  --   HGB 12.4* 11.3*  HCT 39.4 36.3*  MCV 92.3 90.8  PLT 141* 0000000   Basic Metabolic Panel  Recent Labs  12/08/15 1426 12/09/15 0409  NA 137 137  K 4.2 4.4  CL 101 96*  CO2 26 30  GLUCOSE 102* 99  BUN 16 15  CREATININE 1.38* 1.24  CALCIUM 8.6* 8.5*   Liver Function Tests No results for input(s): AST, ALT, ALKPHOS, BILITOT, PROT, ALBUMIN in the last 72 hours. No results for input(s): LIPASE, AMYLASE in the last 72 hours. Cardiac Enzymes  Recent Labs  12/08/15 2206 12/09/15 0409 12/09/15 0916  TROPONINI 0.29* 0.27* 0.27*   BNP Invalid input(s): POCBNP D-Dimer No results for input(s): DDIMER in the last 72 hours. Hemoglobin A1C No results for input(s): HGBA1C in the last 72 hours. Fasting Lipid Panel No results for input(s): CHOL, HDL, LDLCALC, TRIG, CHOLHDL, LDLDIRECT in the last 72 hours. Thyroid Function Tests  Recent Labs  12/08/15 2221  TSH 1.264    Dg Chest 2 View  12/08/2015  CLINICAL DATA:  Syncopal episode yesterday. Another syncopal episode today. EXAM: CHEST  2 VIEW COMPARISON:  11/21/2015 FINDINGS: Persistent densities at the right lung base compatible with pleural fluid. Again noted are linear densities in the right lower chest suggestive for atelectasis. Overall, the densities at the right lung base may have increased, particularly based on the lateral view. The left lung remains clear. Heart size is mildly enlarged but not significantly changed. Surgical plate in the lower cervical spine. No evidence for  pneumothorax. IMPRESSION: Increased densities at the right lung base. Findings are compatible with a combination of pleural fluid and atelectasis. Electronically Signed   By: Markus Daft M.D.   On: 12/08/2015 16:45   Dg Chest 2 View  11/21/2015  CLINICAL DATA:  Patient underwent cardioversion on October 05, 2015 and mitral valve repair on October 04, 2015 ; the patient reports shortness of breath but no other complaints. EXAM: CHEST  2 VIEW COMPARISON:  PA and lateral chest x-ray of October 25, 2015 FINDINGS: There remains a small right pleural effusion. The pulmonary interstitium at the right lung base has partially cleared. The left lung is clear. The cardiac silhouette remains enlarged. The pulmonary vascularity is less engorged. The mediastinum is normal in width. There are degenerative changes of both shoulders and AC joints. IMPRESSION: Improved appearance of the chest. Persistent small right pleural effusion. Improving right basilar atelectasis or pneumonia. Decreased pulmonary interstitial edema. Stable cardiomegaly. Electronically Signed   By: David  Martinique M.D.   On: 11/21/2015 16:14   Ct Head Wo Contrast  12/08/2015  CLINICAL DATA:  Patient with syncopal episode and fall. Chronic fatigue. Shortness of breath. EXAM: CT HEAD WITHOUT CONTRAST TECHNIQUE: Contiguous axial images were obtained from the base of the skull through the vertex without intravenous contrast. COMPARISON:  Brain CT 10/18/2015 FINDINGS: Ventricles and sulci are normal. Periventricular and subcortical white matter hypodensity compatible with chronic microvascular ischemic changes. No evidence for acute cortically based infarct. There is a chronic right holo-hemispheric cerebral spinal fluid equivalent subdural hematoma measuring 14 mm, unchanged from prior. There is persistent mass effect on the sulci within the right cerebral hemisphere. No midline shift. The orbits are unremarkable. Mucosal thickening involving the paranasal sinuses.  Mastoid air cells are well aerated. Calvarium is intact. IMPRESSION: Grossly unchanged chronic right holo-hemispheric subdural hematoma without midline shift. Chronic microvascular ischemic change. Electronically Signed   By: Lovey Newcomer M.D.   On: 12/08/2015 17:17     Diagnostic Studies/Procedures    None _____________    Disposition   Pt is being discharged home today in good condition.  Follow-up Plans & Appointments    Follow-up Information    Follow up with [name], MD.   Specialty:  Cardiology   Why:  Please follow up with Dr. Percival Spanish as previously scheduled   Contact information:   [address] [place] [address] [phone]       Follow up with [name], MD.   Specialty:  Family Medicine   Why:  Please see your PCP for close follow up    Contact information:   [address] [place] [address] [phone]        Discharge Medications   Current Discharge Medication List    CONTINUE these medications which have CHANGED   Details  furosemide (LASIX) 40 MG tablet Take 1 tablet (40 mg total) by mouth 2 (two) times daily. Qty: 60 tablet, Refills: 1   Associated Diagnoses: Chronic diastolic congestive heart failure (HCC)      CONTINUE these medications which have NOT CHANGED   Details  acetaminophen (TYLENOL) 500 MG tablet Take 2 tablets (1,000 mg total) by mouth every 6 (six) hours as needed for mild pain or fever. Qty: 30 tablet, Refills: 0    amiodarone (PACERONE) 200 MG tablet Take 1 tablet (200 mg total) by mouth daily. Qty: 30 tablet, Refills: 1    aspirin EC 81 MG tablet Take 81 mg by mouth daily.    colchicine 0.6 MG tablet Take 1 tablet (0.6 mg total) by mouth daily. Qty: 14 tablet, Refills: 0    finasteride (PROSCAR) 5 MG tablet TAKE ONE (1) TABLET EACH DAY Qty: 30 tablet, Refills: 3    fluticasone (FLONASE) 50 MCG/ACT nasal spray Place 1 spray into both nostrils daily as needed for  allergies or rhinitis.    metoprolol tartrate (LOPRESSOR) 25 MG tablet Take 0.5 tablets (12.5 mg total) by mouth 2 (two) times daily. Qty: 60 tablet, Refills: 1    potassium chloride SA (K-DUR,KLOR-CON) 20 MEQ tablet Take 1 tablet (20 mEq total) by mouth daily. Qty: 30 tablet, Refills: 1    pravastatin (PRAVACHOL) 40 MG tablet Take 1 tablet (40 mg total) by mouth daily at 6 PM. Qty: 30 tablet, Refills: 11    tamsulosin (FLOMAX) 0.4 MG CAPS capsule Take 1 capsule (0.4 mg total) by mouth daily. Qty: 90 capsule, Refills: 1    traMADol (ULTRAM) 50 MG tablet Take 1 tablet (50 mg total) by mouth every 6 (six) hours as needed for moderate pain.  Qty: 20 tablet, Refills: 0    warfarin (COUMADIN) 2 MG tablet Take 0.5 tablets (1 mg total) by mouth daily at 6 PM. Or as directed Qty: 30 tablet, Refills: 1      STOP taking these medications     gabapentin (NEURONTIN) 100 MG capsule           Outstanding Labs/Studies   none  Duration of Discharge Encounter   Greater than 30 minutes including physician time.  SignedAngelena Form R PA-C 12/09/2015, 11:55 AM

## 2015-12-09 NOTE — Progress Notes (Signed)
Pt returned to bed after getting weight and had tremors, eyes rolled back in his head, and unconscious for about one minute. Heart monitor showed SB in the 40s. Pt regained consciousness A&O with no recollection of feeling unusual before his incident. Rapid response and cariology notified and checked on pt.

## 2015-12-10 ENCOUNTER — Encounter (HOSPITAL_COMMUNITY): Payer: Self-pay | Admitting: Physician Assistant

## 2015-12-10 DIAGNOSIS — R55 Syncope and collapse: Secondary | ICD-10-CM | POA: Diagnosis not present

## 2015-12-10 DIAGNOSIS — I951 Orthostatic hypotension: Secondary | ICD-10-CM | POA: Diagnosis present

## 2015-12-10 DIAGNOSIS — R531 Weakness: Secondary | ICD-10-CM | POA: Diagnosis not present

## 2015-12-10 LAB — URINALYSIS, ROUTINE W REFLEX MICROSCOPIC
BILIRUBIN URINE: NEGATIVE
Glucose, UA: NEGATIVE mg/dL
Hgb urine dipstick: NEGATIVE
KETONES UR: NEGATIVE mg/dL
LEUKOCYTES UA: NEGATIVE
NITRITE: NEGATIVE
Protein, ur: NEGATIVE mg/dL
Specific Gravity, Urine: 1.011 (ref 1.005–1.030)
pH: 7.5 (ref 5.0–8.0)

## 2015-12-10 LAB — BASIC METABOLIC PANEL
Anion gap: 14 (ref 5–15)
BUN: 11 mg/dL (ref 6–20)
CALCIUM: 8.9 mg/dL (ref 8.9–10.3)
CO2: 25 mmol/L (ref 22–32)
Chloride: 97 mmol/L — ABNORMAL LOW (ref 101–111)
Creatinine, Ser: 1.41 mg/dL — ABNORMAL HIGH (ref 0.61–1.24)
GFR calc Af Amer: 51 mL/min — ABNORMAL LOW (ref >60)
GFR, EST NON AFRICAN AMERICAN: 44 mL/min — AB (ref >60)
GLUCOSE: 188 mg/dL — AB (ref 65–99)
POTASSIUM: 3.9 mmol/L (ref 3.5–5.1)
SODIUM: 136 mmol/L (ref 135–145)

## 2015-12-10 LAB — VITAMIN B12: VITAMIN B 12: 608 pg/mL (ref 180–914)

## 2015-12-10 LAB — PROTIME-INR
INR: 1.28 (ref 0.00–1.49)
PROTHROMBIN TIME: 16.1 s — AB (ref 11.6–15.2)

## 2015-12-10 MED ORDER — POLYETHYLENE GLYCOL 3350 17 G PO PACK
17.0000 g | PACK | Freq: Every day | ORAL | Status: DC
  Administered 2015-12-10 – 2015-12-11 (×2): 17 g via ORAL
  Filled 2015-12-10 (×2): qty 1

## 2015-12-10 MED ORDER — WARFARIN SODIUM 3 MG PO TABS
3.0000 mg | ORAL_TABLET | Freq: Once | ORAL | Status: AC
Start: 2015-12-10 — End: 2015-12-10
  Administered 2015-12-10: 3 mg via ORAL
  Filled 2015-12-10: qty 1

## 2015-12-10 MED ORDER — DOCUSATE SODIUM 100 MG PO CAPS: 100.0000 mg | ORAL_CAPSULE | Freq: Every day | ORAL | Status: DC

## 2015-12-10 MED ORDER — SODIUM CHLORIDE 0.9 % IV SOLN
INTRAVENOUS | Status: DC
  Administered 2015-12-10 – 2015-12-11 (×2): via INTRAVENOUS

## 2015-12-10 MED ORDER — DOCUSATE SODIUM 100 MG PO CAPS
100.0000 mg | ORAL_CAPSULE | Freq: Two times a day (BID) | ORAL | Status: DC | PRN
  Administered 2015-12-10: 100 mg via ORAL
  Filled 2015-12-10: qty 1

## 2015-12-10 MED ORDER — DOCUSATE SODIUM 100 MG PO CAPS
100.0000 mg | ORAL_CAPSULE | Freq: Two times a day (BID) | ORAL | Status: DC
  Administered 2015-12-10 – 2015-12-11 (×2): 100 mg via ORAL
  Filled 2015-12-10 (×2): qty 1

## 2015-12-10 MED ORDER — AMIODARONE HCL 100 MG PO TABS
100.0000 mg | ORAL_TABLET | Freq: Every day | ORAL | Status: DC
  Administered 2015-12-11: 100 mg via ORAL
  Filled 2015-12-10: qty 1

## 2015-12-10 NOTE — Progress Notes (Signed)
ANTICOAGULATION CONSULT NOTE - Follow Up Consult  Pharmacy Consult for coumadin Indication: atrial fibrillation  No Known Allergies  Patient Measurements: Height: 5\' 10"  (177.8 cm) Weight: 201 lb 1 oz (91.2 kg) IBW/kg (Calculated) : 73  Vital Signs: Temp: 98 F (36.7 C) (05/07 0459) Temp Source: Oral (05/07 0459) BP: 148/70 mmHg (05/07 0459) Pulse Rate: 66 (05/07 0459)  Labs:  Recent Labs  12/08/15 1426 12/08/15 1709 12/08/15 1745 12/08/15 2206 12/09/15 0409 12/09/15 0916 12/10/15 0613  HGB  --   --  12.4*  --  11.3*  --   --   HCT  --   --  39.4  --  36.3*  --   --   PLT  --   --  141*  --  162  --   --   LABPROT  --  16.3*  --   --  16.2*  --  16.1*  INR  --  1.30  --   --  1.29  --  1.28  CREATININE 1.38*  --   --   --  1.24  --   --   TROPONINI  --   --   --  0.29* 0.27* 0.27*  --     Estimated Creatinine Clearance: 50.4 mL/min (by C-G formula based on Cr of 1.24).   Assessment: 82 yo m with history of Afib to continue on Coumadin.  Patient's home regimen was recently changed to 1mg  daily except except 2mg  on Mon and Fri due to an INR of 1.2 per [facility] visit note.   Patient's INR remains sub-therapeutic today at 1.28. CBC stable - hgb 11.3 plts 162. No issues per RN.    Goal of Therapy:  INR 2-2.5 (per anticoag clinic notes) Monitor platelets by anticoagulation protocol: Yes   Plan:  - Coumadin 3 mg PO x 1 again tonight - F/u INR in AM - Monitor s/s of bleeding  Marveline Profeta L. Nicole Kindred, PharmD PGY2 Infectious Diseases Pharmacy Resident Pager: [phone] 12/10/2015 9:56 AM

## 2015-12-10 NOTE — Consult Note (Signed)
Neurology Consultation Reason for Consult: Spells Referring Physician: Ezzie Dural  CC: Spells  History is obtained from: Wife, chart  HPI: [name] is a 82 y.o. male admitted for spells. I spoke to his wife who provides following history.  Thursday, she heard him fall and she had to help him get up. He had "no use of his body" and his legs would just give way bilaterally. He came around over about 10 - 15 minutes. He never completely lost consciousness.  The next time, he had just sit up and she was getting him ready to go to the heart doctor. He was sitting on the bed and then began falling forwards. His "eyes were rolled back." She then laid him down. She called 911 in response. He came to over the course of a few minutes.  Yesterday, he was getting ready to go. He got up and was standing started jerking all over. He would look at her but his legs would give way. He was jerking bilaterally with preserved consciousness.  It always is associated with posture change, usually when standing. It has never occurred without posture change.   He has also been noted to be bradycardic.   ROS: A 14 point ROS was performed and is negative except as noted in the HPI.   Past Medical History  Diagnosis Date  . Coronary artery disease     a. 11/2014 NSTEMI: DESx 2 placed to LAD and RCA   . Mitral valve prolapse   . Diabetes mellitus, type II ([place])     a. HgA1c 6.8 in 03/2015  . Mitral regurgitation     a. severe wtih flail leaflet by 2D ECHO (06/05/15)  . SVT (supraventricular tachycardia) (HCC)     a. recurrent, usually responsive to vagal manuevers  . HTN (hypertension)   . Pulmonary HTN ([place])   . Lower extremity edema     a. chronic  . HLD (hyperlipidemia)   . Hypertension   . OA (osteoarthritis)   . ED (erectile dysfunction)   . Chronic fatigue   . Gout   . Spinal stenosis   . Prostate cancer ([place])   . Severe mitral regurgitation 11/17/2014    a.  s/p minimally invasive MV  repair (10/2015)  . Chronic diastolic (congestive) heart failure ([place])   . Hemangioma of liver 08/02/2015    12 mm enhancing lesion seen on CT - suspicious for benign hemangioma but not diagnostic - MRI recommended  . Kidney stones   . Physical deconditioning   . S/P minimally invasive mitral valve repair 10/04/2015    a. Complex valvuloplasty including artificial Gore-tex neochord placement x6 with plication closure of cleft between P2 and P3 and 30 mm Sorin Memo 3D ring annuloplasty via right mini thoracotomy approach   . PAF (paroxysmal atrial fibrillation) (HCC)     a. on coumadin   . CAD (coronary artery disease)     a. 11/2014  DES to LAD and RCA     Family History  Problem Relation Age of Onset  . Heart failure Mother 74  . Heart attack Mother   . Heart attack Brother      Social History:  reports that he has quit smoking. He has never used smokeless tobacco. He reports that he does not drink alcohol or use illicit drugs.   Exam: Current vital signs: BP 148/70 mmHg  Pulse 66  Temp(Src) 98 F (36.7 C) (Oral)  Resp 18  Ht 5\' 10"  (1.778 m)  Wt 91.2 kg (201 lb 1 oz)  BMI 28.85 kg/m2  SpO2 96% Vital signs in last 24 hours: Temp:  [97.7 F (36.5 C)-98 F (36.7 C)] 98 F (36.7 C) (05/07 0459) Pulse Rate:  [60-66] 66 (05/07 0459) Resp:  [18] 18 (05/07 0459) BP: (101-151)/(54-70) 148/70 mmHg (05/07 0459) SpO2:  [96 %-99 %] 96 % (05/07 0459) Weight:  [91.2 kg (201 lb 1 oz)] 91.2 kg (201 lb 1 oz) (05/07 0459)   Physical Exam  Constitutional: Appears well-developed and well-nourished.  Psych: Affect appropriate to situation Eyes: No scleral injection HENT: No OP obstrucion Head: Normocephalic.  Cardiovascular: Normal rate  Respiratory: Effort normal and breath sounds normal to anterior ascultation GI: Soft.  No distension. There is no tenderness.  Skin: WDI Strengths: Significant edema bilaterally  Neuro: Mental Status: Patient is awake, alert, oriented to  person, place, month, year, and situation. Patient is able to give a clear and coherent history. No signs of aphasia or neglect He is difficult to understand, but I do not think it is a true dysarthria. Cranial Nerves: II: Visual Fields are full. Pupils are equal, round, and reactive to light.   III,IV, VI: EOMI without ptosis or diploplia.  V: Facial sensation is symmetric to temperature VII: Facial movement is symmetric.  VIII: hearing is intact to voice X: Uvula elevates symmetrically XI: Shoulder shrug is symmetric. XII: tongue is midline without atrophy or fasciculations.  Motor: Tone is normal. Bulk is normal. 5/5 strength was present in all four extremities with the exception of median nerve distribution weakness of bilateral hands. Sensory: Sensation is diminished in a stocking distribution bilaterally below the ankles. Cerebellar: FNF and HKS are intact bilaterally   I have reviewed labs in epic and the results pertinent to this consultation are: BMP-unremarkable  I have reviewed the images obtained: CT head-chronic right SDH  Impression: 82 year old male with a history of about a week of recurrent presyncope/syncope. The description of the spells with bilateral myoclonic type movements as well as incompletely lost consciousness sounds much more like hypoperfusion than it does like epileptic seizure. He does have mild sensory loss in his feet bilaterally, though this could be related to his marked edema.  Recommendations: 1) orthostatic vital signs 2) B12, A1c 3) if he continues to be hospitalized tomorrow, then an EEG would be reasonable. Otherwise, this could be done as an outpatient. He lives in [place] indicates that he would rather follow-up with the neurologist there rather than [place].  Roland Rack, MD [facility] [phone]  If 7pm- 7am, please page neurology on call as listed in [place].

## 2015-12-10 NOTE — Progress Notes (Signed)
Patient Name: [name] Date of Encounter: 12/10/2015     Principal Problem:   Weakness Active Problems:   Hypertension   Gout   Dyslipidemia   Diabetes mellitus, type II ([place])   Pulmonary HTN ([place])   Lower extremity edema   S/P minimally invasive mitral valve repair   PAF (paroxysmal atrial fibrillation) ([facility])   CAD (coronary artery disease)    SUBJECTIVE  No complaints this AM.   CURRENT MEDS . amiodarone  200 mg Oral Daily  . aspirin EC  81 mg Oral Daily  . finasteride  5 mg Oral Daily  . furosemide  40 mg Oral BID  . potassium chloride SA  20 mEq Oral Daily  . pravastatin  40 mg Oral q1800  . sodium chloride flush  3 mL Intravenous Q12H  . tamsulosin  0.4 mg Oral Daily  . Warfarin - Pharmacist Dosing Inpatient   Does not apply q1800    OBJECTIVE  Filed Vitals:   12/09/15 0454 12/09/15 1230 12/09/15 2000 12/10/15 0459  BP: 139/70 101/54 151/68 148/70  Pulse: 63 62 60 66  Temp: 97.5 F (36.4 C) 97.7 F (36.5 C) 97.9 F (36.6 C) 98 F (36.7 C)  TempSrc: Oral Oral Oral Oral  Resp: 16 18 18 18   Height:      Weight: 202 lb 9.6 oz (91.899 kg)   201 lb 1 oz (91.2 kg)  SpO2: 97% 99% 99% 96%    Intake/Output Summary (Last 24 hours) at 12/10/15 0737 Last data filed at 12/10/15 0704  Gross per 24 hour  Intake    700 ml  Output    800 ml  Net   -100 ml   Filed Weights   12/08/15 2201 12/09/15 0454 12/10/15 0459  Weight: 203 lb 3.2 oz (92.171 kg) 202 lb 9.6 oz (91.899 kg) 201 lb 1 oz (91.2 kg)    PHYSICAL EXAM  General: Pleasant, NAD. Chronically ill appearing.  Neuro: Alert and oriented X 3. Moves all extremities spontaneously. Psych: Normal affect. HEENT: Normal Neck: Supple without bruits or JVD. Lungs: Resp regular and unlabored, CTA. Heart: RRR no s3, s4, or + murmurs. Abdomen: Soft, non-tender, non-distended, BS + x 4.  Extremities: No clubbing, cyanosis or 2+ pitting bilateral edema. DP/PT/Radials 2+ and equal  bilaterally.  Accessory Clinical Findings  CBC  Recent Labs  12/08/15 1745 12/09/15 0409  WBC 6.7 6.8  NEUTROABS 4.3  --   HGB 12.4* 11.3*  HCT 39.4 36.3*  MCV 92.3 90.8  PLT 141* 0000000   Basic Metabolic Panel  Recent Labs  12/08/15 1426 12/09/15 0409  NA 137 137  K 4.2 4.4  CL 101 96*  CO2 26 30  GLUCOSE 102* 99  BUN 16 15  CREATININE 1.38* 1.24  CALCIUM 8.6* 8.5*   Cardiac Enzymes  Recent Labs  12/08/15 2206 12/09/15 0409 12/09/15 0916  TROPONINI 0.29* 0.27* 0.27*   Thyroid Function Tests  Recent Labs  12/08/15 2221  TSH 1.264    TELE  Sinus bradycardia and some junctional rhythm noted overnight.  Radiology/Studies  Dg Chest 2 View  12/08/2015  CLINICAL DATA:  Syncopal episode yesterday. Another syncopal episode today. EXAM: CHEST  2 VIEW COMPARISON:  11/21/2015 FINDINGS: Persistent densities at the right lung base compatible with pleural fluid. Again noted are linear densities in the right lower chest suggestive for atelectasis. Overall, the densities at the right lung base may have increased, particularly based on the lateral view. The left lung remains  clear. Heart size is mildly enlarged but not significantly changed. Surgical plate in the lower cervical spine. No evidence for pneumothorax. IMPRESSION: Increased densities at the right lung base. Findings are compatible with a combination of pleural fluid and atelectasis. Electronically Signed   By: Markus Daft M.D.   On: 12/08/2015 16:45   Dg Chest 2 View  11/21/2015  CLINICAL DATA:  Patient underwent cardioversion on October 05, 2015 and mitral valve repair on October 04, 2015 ; the patient reports shortness of breath but no other complaints. EXAM: CHEST  2 VIEW COMPARISON:  PA and lateral chest x-ray of October 25, 2015 FINDINGS: There remains a small right pleural effusion. The pulmonary interstitium at the right lung base has partially cleared. The left lung is clear. The cardiac silhouette remains enlarged.  The pulmonary vascularity is less engorged. The mediastinum is normal in width. There are degenerative changes of both shoulders and AC joints. IMPRESSION: Improved appearance of the chest. Persistent small right pleural effusion. Improving right basilar atelectasis or pneumonia. Decreased pulmonary interstitial edema. Stable cardiomegaly. Electronically Signed   By: David  Martinique M.D.   On: 11/21/2015 16:14   Ct Head Wo Contrast  12/08/2015  CLINICAL DATA:  Patient with syncopal episode and fall. Chronic fatigue. Shortness of breath. EXAM: CT HEAD WITHOUT CONTRAST TECHNIQUE: Contiguous axial images were obtained from the base of the skull through the vertex without intravenous contrast. COMPARISON:  Brain CT 10/18/2015 FINDINGS: Ventricles and sulci are normal. Periventricular and subcortical white matter hypodensity compatible with chronic microvascular ischemic changes. No evidence for acute cortically based infarct. There is a chronic right holo-hemispheric cerebral spinal fluid equivalent subdural hematoma measuring 14 mm, unchanged from prior. There is persistent mass effect on the sulci within the right cerebral hemisphere. No midline shift. The orbits are unremarkable. Mucosal thickening involving the paranasal sinuses. Mastoid air cells are well aerated. Calvarium is intact. IMPRESSION: Grossly unchanged chronic right holo-hemispheric subdural hematoma without midline shift. Chronic microvascular ischemic change. Electronically Signed   By: Lovey Newcomer M.D.   On: 12/08/2015 17:17    ASSESSMENT AND PLAN  [name] is a 82 y.o. male with a history of CAD s/p DES to LAD and RCA (11/2014), atrial fibrillation, severe MR with flail leaflet s/p minimally invasive MV repair (10/2015), HTN, type II DM, dynamic LV outflow obstruction, chronic LE edema, subdural hematoma and chronic debilitation who presented to [facility] ED on 12/08/15 with dizziness and weakness.   Dizziness/Weakness: unclear etiology. Broad  differential dx in this fairly debilitated gentleman. He does not appear acutely ill and his heart rhythm appears stable. CXR and Head CT and both are without acute findings. BP stable. Initially felt to be related to newly started Gabapentin which was stopped on admission. Plan was for discharge yesterday but on his way out, he had another episode with seizure like activity and LOC per RN report. During episode, BP 122/72 and HR in 40s. Neuro consulted and felt this was due to orthostasis. Also felt like it was unrelated to Gabapentin and that this could be resumed.   Orthostatic hypotension: Orthostatics were checked and positive: 112/56--> 85/49--> 82/53--> 78/54. Consider midodrine? Would recommend compression stockings and possibly abdominal binder. MD to see  Bradycardia: Also his HR was noted to be in the 30s upon EMS arrival although there are no strips for this. He does have some bradycardia on telemetry with HRs in 30-40s. Consider outpatient monitor if spells continue despite treatment of orthostasis.  Mitral valve disease s/p MV repair: intact repair by post-op echo.   CAD - s/p PCI: minimal troponin elevation with flat trend. Chronically elevated since 11/2014 when looking through records. No CP, EKG changes, or signs of ischemia  DM: continue home regimen   Acute on chronic diastolic heart failure: while he didn't c/o shortness of breath and has chronic LE edema, BNP was much more elevated past values (1647), and exam/CXR suggest volume excess. He was given 1 dose of IV lasix. Creat improved 1.38--> 1.24. Net neg ~837mL. Now on lasix 40mg  BID.   PAF: on coumadin for CHADSVASC of at least 6 (CHF, HTN, Age, DM, CAD). Currently in sinus. INR subtheraptuic. Continue amiodarone.  Judy Pimple PA-C  Pager [phone]   Patient seen and discussed with PA Grandville Silos, I agree with her documentation. Patient was prepped for discharge yestery but repeat episode of weakness,  dizziness, and AMS. Appreciate neuro evaluation for weakness episodes. By neuro history thought to be related to hypoperfusion, he is significantly orthostatic by vitals. His LE edema is likely due to venous insufficiency as opposed to total volume overload as he contniues to have significant LE edema while being severely orthostatics. We will hold diuretics today, give him gentle IVF's and follow for repeat symptoms. He will need compression stockings for home use. If orthostatics do not resolve with fluid, he may have a component of autonomic insufficiency given his DM2 and would need consideration for midorine, would avoid florinef due to chronic edema. We will repeat orthostatics in the AM.  He also has occasional short episodes of bradycardia to low 40s, if episodes do not resolve once orthostatics corrected would consider outpatient monitor, we will go ahead and lower his amio to 100mg  daily. Appreciate neuro recs, will need outpatient follow up. Will monitor today, if no recurrent episodes consider discharge tomorrow. We have also stopped his neurontin due to possible side effects, ths can be reconsidered at follow up.    Zandra Abts MD

## 2015-12-11 ENCOUNTER — Ambulatory Visit: Payer: Self-pay | Admitting: Family Medicine

## 2015-12-11 DIAGNOSIS — I951 Orthostatic hypotension: Secondary | ICD-10-CM | POA: Diagnosis not present

## 2015-12-11 DIAGNOSIS — R55 Syncope and collapse: Secondary | ICD-10-CM | POA: Diagnosis present

## 2015-12-11 LAB — BASIC METABOLIC PANEL
ANION GAP: 8 (ref 5–15)
BUN: 11 mg/dL (ref 6–20)
CHLORIDE: 102 mmol/L (ref 101–111)
CO2: 28 mmol/L (ref 22–32)
CREATININE: 1.13 mg/dL (ref 0.61–1.24)
Calcium: 8.3 mg/dL — ABNORMAL LOW (ref 8.9–10.3)
GFR calc non Af Amer: 58 mL/min — ABNORMAL LOW (ref >60)
Glucose, Bld: 96 mg/dL (ref 65–99)
POTASSIUM: 3.7 mmol/L (ref 3.5–5.1)
SODIUM: 138 mmol/L (ref 135–145)

## 2015-12-11 LAB — PROTIME-INR
INR: 1.3 (ref 0.00–1.49)
PROTHROMBIN TIME: 16.3 s — AB (ref 11.6–15.2)

## 2015-12-11 LAB — HEMOGLOBIN A1C
HEMOGLOBIN A1C: 5.7 % — AB (ref 4.8–5.6)
Mean Plasma Glucose: 117 mg/dL

## 2015-12-11 MED ORDER — FUROSEMIDE 40 MG PO TABS: 40.0000 mg | ORAL_TABLET | Freq: Every day | ORAL | Status: DC

## 2015-12-11 MED ORDER — AMIODARONE HCL 100 MG PO TABS: 100.0000 mg | ORAL_TABLET | Freq: Every day | ORAL | Status: DC

## 2015-12-11 MED ORDER — WARFARIN SODIUM 2 MG PO TABS: 4.0000 mg | ORAL_TABLET | Freq: Once | ORAL | Status: DC

## 2015-12-11 NOTE — Progress Notes (Signed)
Patient is discharge to home accompanied by patient's spouse and NT via wheelchair. Discharge instructions given . Patient verbalizes understanding. All personal belongings given. Telemetry box and IV removed prior to discharge and site in good condition.  

## 2015-12-11 NOTE — Progress Notes (Signed)
ANTICOAGULATION CONSULT NOTE - Follow Up Consult  Pharmacy Consult for coumadin Indication: atrial fibrillation  No Known Allergies  Patient Measurements: Height: 5\' 10"  (177.8 cm) Weight: 201 lb 14.4 oz (91.581 kg) IBW/kg (Calculated) : 73  Vital Signs: Temp: 97.5 F (36.4 C) (05/08 1212) Temp Source: Oral (05/08 1212) BP: 151/80 mmHg (05/08 1212) Pulse Rate: 76 (05/08 1212)  Labs:  Recent Labs  12/08/15 1745 12/08/15 2206 12/09/15 0409 12/09/15 0916 12/10/15 0613 12/10/15 1032 12/11/15 0350  HGB 12.4*  --  11.3*  --   --   --   --   HCT 39.4  --  36.3*  --   --   --   --   PLT 141*  --  162  --   --   --   --   LABPROT  --   --  16.2*  --  16.1*  --  16.3*  INR  --   --  1.29  --  1.28  --  1.30  CREATININE  --   --  1.24  --   --  1.41* 1.13  TROPONINI  --  0.29* 0.27* 0.27*  --   --   --     Estimated Creatinine Clearance: 55.3 mL/min (by C-G formula based on Cr of 1.13).   Assessment: 82 yo m with history of Afib to continue on Coumadin.  Patient's home regimen was recently changed to 1mg  daily except 2mg  on Mon and Fri due to an INR of 1.2 per [facility] visit note.  Patient's INR remains sub-therapeutic today at 1.30. We have increased dose to 3mg  over last 2 days in this 82 y.o. Amiodarone continued from prior to admission. On 5/6 the CBC was stable - hgb 11.3 plts 162. No bleeding noted.   Goal of Therapy:  INR 2-2.5 (per anticoag clinic notes) Monitor platelets by anticoagulation protocol: Yes   Plan:  - Coumadin 4 mg PO x 1 again tonight - F/u INR in AM - Monitor s/s of bleeding  Nicole Cella, RPh Clinical Pharmacist Pager: [phone] 12/11/2015 2:42 PM

## 2015-12-11 NOTE — Progress Notes (Signed)
SUBJECTIVE:  He says that he feels well.  No pain.     PHYSICAL EXAM Filed Vitals:   12/10/15 1141 12/10/15 2056 12/11/15 0349 12/11/15 0620  BP: 133/65 134/73 147/67   Pulse: 69 67 70   Temp: 97.8 F (36.6 C) 98.2 F (36.8 C) 97.7 F (36.5 C)   TempSrc: Oral Oral Oral Oral  Resp: 19 18 20    Height:      Weight:   201 lb 14.4 oz (91.581 kg)   SpO2: 98% 100% 97%    General:  No distress Lungs:  Clear Heart:  RRR Abdomen:  Positive bowel sounds, no rebound no guarding Extremities:  Mild edema   LABS: Lab Results  Component Value Date   TROPONINI 0.27* 12/09/2015   Results for orders placed or performed during the hospital encounter of 12/08/15 (from the past 24 hour(s))  Basic metabolic panel     Status: Abnormal   Collection Time: 12/10/15 10:32 AM  Result Value Ref Range   Sodium 136 135 - 145 mmol/L   Potassium 3.9 3.5 - 5.1 mmol/L   Chloride 97 (L) 101 - 111 mmol/L   CO2 25 22 - 32 mmol/L   Glucose, Bld 188 (H) 65 - 99 mg/dL   BUN 11 6 - 20 mg/dL   Creatinine, Ser 1.41 (H) 0.61 - 1.24 mg/dL   Calcium 8.9 8.9 - 10.3 mg/dL   GFR calc non Af Amer 44 (L) >60 mL/min   GFR calc Af Amer 51 (L) >60 mL/min   Anion gap 14 5 - 15  Vitamin B12     Status: None   Collection Time: 12/10/15 10:32 AM  Result Value Ref Range   Vitamin B-12 608 180 - 914 pg/mL  Urinalysis, Routine w reflex microscopic (not at [facility])     Status: Abnormal   Collection Time: 12/10/15  8:24 PM  Result Value Ref Range   Color, Urine YELLOW YELLOW   APPearance CLOUDY (A) CLEAR   Specific Gravity, Urine 1.011 1.005 - 1.030   pH 7.5 5.0 - 8.0   Glucose, UA NEGATIVE NEGATIVE mg/dL   Hgb urine dipstick NEGATIVE NEGATIVE   Bilirubin Urine NEGATIVE NEGATIVE   Ketones, ur NEGATIVE NEGATIVE mg/dL   Protein, ur NEGATIVE NEGATIVE mg/dL   Nitrite NEGATIVE NEGATIVE   Leukocytes, UA NEGATIVE NEGATIVE  Protime-INR     Status: Abnormal   Collection Time: 12/11/15  3:50 AM  Result Value Ref Range   Prothrombin Time 16.3 (H) 11.6 - 15.2 seconds   INR 1.30 0.00 - 99991111  Basic metabolic panel     Status: Abnormal   Collection Time: 12/11/15  3:50 AM  Result Value Ref Range   Sodium 138 135 - 145 mmol/L   Potassium 3.7 3.5 - 5.1 mmol/L   Chloride 102 101 - 111 mmol/L   CO2 28 22 - 32 mmol/L   Glucose, Bld 96 65 - 99 mg/dL   BUN 11 6 - 20 mg/dL   Creatinine, Ser 1.13 0.61 - 1.24 mg/dL   Calcium 8.3 (L) 8.9 - 10.3 mg/dL   GFR calc non Af Amer 58 (L) >60 mL/min   GFR calc Af Amer >60 >60 mL/min   Anion gap 8 5 - 15    Intake/Output Summary (Last 24 hours) at 12/11/15 0803 Last data filed at 12/11/15 0356  Gross per 24 hour  Intake    778 ml  Output   1000 ml  Net   -222 ml  ASSESSMENT AND PLAN:  ORTHOSTATIC HYPOTENSION:  I am going to apply an abdominal binder and knee high compression stockings.  Then we will repeat orthostatics.  If he is ambulating later this afternoon we can discharge.    PAF:    Continue warfarin.   Maintaining sinus.  I looked at telemetry.  No sustained bradycardia.    ELEVATED TROPONIN:  This has been persistently elevated.  I do not suspect any acute coronary syndrome this admission.  Medical management.  Jeneen Rinks [facility] 12/11/2015 8:03 AM

## 2015-12-11 NOTE — Discharge Summary (Signed)
Discharge Summary    Patient ID: [name],  MRN: [id number], DOB/AGE: [birth year] 82 y.o.  Admit date: 12/08/2015 Discharge date: 12/11/2015  Primary Care Provider: [facility] Primary Cardiologist:  Hochrein  Discharge Diagnoses    Principal Problem:   Weakness Active Problems:   Hypertension   Gout   History of PSVT (paroxysmal supraventricular tachycardia)   Dyslipidemia   Diabetes mellitus, type II (HCC)   Coronary artery disease   Pulmonary HTN (HCC)   Lower extremity edema   S/P minimally invasive mitral valve repair   PAF (paroxysmal atrial fibrillation) (HCC)   CAD (coronary artery disease)   Orthostatic hypotension   Syncope   Allergies No Known Allergies  Diagnostic Studies/Procedures     _____________   History of Present Illness     [name] is a 82 y.o. male with a history of CAD s/p DES to LAD and RCA (11/2014), atrial fibrillation, severe MR with flail leaflet s/p minimally invasive MV repair (10/2015), HTN, type II DM, dynamic LV outflow obstruction, chronic LE edema, subdural hematoma and chronic debilitation who presented to [facility] ED on 12/08/15 with dizziness and weakness.   He underwent mitral valve repair [id number] complicated by hemodynamic instability in the early post-op period felt secondary to dynamic LV outflow obstruction and SAM. This resolved with volume and beta-blockade. He also had post-op atrial fibrillation treated with amiodarone. He was readmitted with heart failure and supratherapeutic INR with a fairly prolonged hospitalization of 2 weeks. At some point he had a subdural hematoma and was managed conservatively.   The patient is a poor historian. The patient presented with dizziness and weakness. He denied frank syncope but stated that he felt very weak. Wife apparently reported that he slumped over. He did not fall to the ground or injure himself. When EMS arrived his heart rate was reported in the 30's but there were  no strips available for review. HR on arrival to the ER was sinus in the 60's. The patient denied CP, dyspnea, fever/chills, orthopnea, or PND. He has chronic leg swelling with no recent change. He's sedentary but he's able to walk without too much difficulty. He was recently started on gabapentin.    Hospital Course     Consultants: Neurology  Dizziness/Weakness: unclear etiology. Broad differential dx in this fairly debilitated gentleman. He does not appear acutely ill and his heart rhythm appears stable. CXR and Head CT and both are without acute findings. BP stable. He reported starting gabapenint 12/01/15, shortly after that family reports he started having intermittent weakness episodes. Suspect medication side effect, he will stop gabapentin and follow symptoms, follow up with his pcp as this does not appear to be a cardiac issue. Gabapentin held on admission and will be held at discharge. If symptoms persist off gabapentin, can consider outpatient monitor. Also his HR was noted to be in the 30s upon EMS arrival although no strips for this. He did have some bradycardia overnight with HRs in 40s with some sinus pauses.  He was initially going to be discharged on 5/6 however, he had a syncopal episode with generalized myoclonic jerking which lasted 40 seconds.  BP 122/72 and HR 43 a the time.  Diuretics were held and gentle hydration given.  Amiodarone was decreased to 100mg  daily.  He was orthostatic from sitting to standing.  He was provided with compression socks and an abdominal binder.  Neurology consulted and thought an outpatient EEG would be reasonable.  The  day of his discharge he ambulated on two separate occassions without difficultly or dizziness.   Consider OP cardiac monitoring.   May need midodrine.    Mitral valve disease s/p MV repair: intact repair by post-op echo.   CAD - s/p PCI: minimal troponin elevation with flat trend. Chronically elevated since 11/2014 when looking through  records. No CP, EKG changes, or signs of ischemia  DM: continue home regimen   Acute on chronic diastolic heart failure: while he didn't c/o shortness of breath and has chronic LE edema, BNP was much more elevated past values (1647), and exam/CXR suggest volume excess. He was given 1 dose of IV lasix. Creat improved 1.38--> 1.24. Net neg ~470mL. Will discharge on lasix 40mg  BID.   PAF: on coumadin for CHADSVASC of at least 6 (CHF, HTN, Age, DM, CAD). Currently in sinus. INR subtheraptuic. Continue amiodarone.  The patient was seen by Dr. Percival Spanish today and deemed stable for discharge home.  _____________  Discharge Vitals Blood pressure 151/80, pulse 76, temperature 97.5 F (36.4 C), temperature source Oral, resp. rate 18, height 5\' 10"  (1.778 m), weight 201 lb 14.4 oz (91.581 kg), SpO2 97 %.  Filed Weights   12/09/15 0454 12/10/15 0459 12/11/15 0349  Weight: 202 lb 9.6 oz (91.899 kg) 201 lb 1 oz (91.2 kg) 201 lb 14.4 oz (91.581 kg)    Labs & Radiologic Studies    CBC  Recent Labs  12/08/15 1745 12/09/15 0409  WBC 6.7 6.8  NEUTROABS 4.3  --   HGB 12.4* 11.3*  HCT 39.4 36.3*  MCV 92.3 90.8  PLT 141* 0000000   Basic Metabolic Panel  Recent Labs  12/10/15 1032 12/11/15 0350  NA 136 138  K 3.9 3.7  CL 97* 102  CO2 25 28  GLUCOSE 188* 96  BUN 11 11  CREATININE 1.41* 1.13  CALCIUM 8.9 8.3*   Liver Function Tests No results for input(s): AST, ALT, ALKPHOS, BILITOT, PROT, ALBUMIN in the last 72 hours. No results for input(s): LIPASE, AMYLASE in the last 72 hours. Cardiac Enzymes  Recent Labs  12/08/15 2206 12/09/15 0409 12/09/15 0916  TROPONINI 0.29* 0.27* 0.27*   BNP Invalid input(s): POCBNP D-Dimer No results for input(s): DDIMER in the last 72 hours. Hemoglobin A1C  Recent Labs  12/08/15 2209  HGBA1C 5.7*   Fasting Lipid Panel No results for input(s): CHOL, HDL, LDLCALC, TRIG, CHOLHDL, LDLDIRECT in the last 72 hours. Thyroid Function Tests  Recent  Labs  12/08/15 2221  TSH 1.264   _____________  Dg Chest 2 View  12/08/2015  CLINICAL DATA:  Syncopal episode yesterday. Another syncopal episode today. EXAM: CHEST  2 VIEW COMPARISON:  11/21/2015 FINDINGS: Persistent densities at the right lung base compatible with pleural fluid. Again noted are linear densities in the right lower chest suggestive for atelectasis. Overall, the densities at the right lung base may have increased, particularly based on the lateral view. The left lung remains clear. Heart size is mildly enlarged but not significantly changed. Surgical plate in the lower cervical spine. No evidence for pneumothorax. IMPRESSION: Increased densities at the right lung base. Findings are compatible with a combination of pleural fluid and atelectasis. Electronically Signed   By: Markus Daft M.D.   On: 12/08/2015 16:45   Dg Chest 2 View  11/21/2015  CLINICAL DATA:  Patient underwent cardioversion on October 05, 2015 and mitral valve repair on October 04, 2015 ; the patient reports shortness of breath but no other complaints. EXAM: CHEST  2 VIEW COMPARISON:  PA and lateral chest x-ray of October 25, 2015 FINDINGS: There remains a small right pleural effusion. The pulmonary interstitium at the right lung base has partially cleared. The left lung is clear. The cardiac silhouette remains enlarged. The pulmonary vascularity is less engorged. The mediastinum is normal in width. There are degenerative changes of both shoulders and AC joints. IMPRESSION: Improved appearance of the chest. Persistent small right pleural effusion. Improving right basilar atelectasis or pneumonia. Decreased pulmonary interstitial edema. Stable cardiomegaly. Electronically Signed   By: David  Martinique M.D.   On: 11/21/2015 16:14   Ct Head Wo Contrast  12/08/2015  CLINICAL DATA:  Patient with syncopal episode and fall. Chronic fatigue. Shortness of breath. EXAM: CT HEAD WITHOUT CONTRAST TECHNIQUE: Contiguous axial images were obtained  from the base of the skull through the vertex without intravenous contrast. COMPARISON:  Brain CT 10/18/2015 FINDINGS: Ventricles and sulci are normal. Periventricular and subcortical white matter hypodensity compatible with chronic microvascular ischemic changes. No evidence for acute cortically based infarct. There is a chronic right holo-hemispheric cerebral spinal fluid equivalent subdural hematoma measuring 14 mm, unchanged from prior. There is persistent mass effect on the sulci within the right cerebral hemisphere. No midline shift. The orbits are unremarkable. Mucosal thickening involving the paranasal sinuses. Mastoid air cells are well aerated. Calvarium is intact. IMPRESSION: Grossly unchanged chronic right holo-hemispheric subdural hematoma without midline shift. Chronic microvascular ischemic change. Electronically Signed   By: Lovey Newcomer M.D.   On: 12/08/2015 17:17   Disposition   Pt is being discharged home today in good condition.  Follow-up Plans & Appointments    Follow-up Information    Follow up with [name], MD.   Specialty:  Cardiology   Why:  Please follow up with Dr. Percival Spanish as previously scheduled   Contact information:   [place] [address] [phone]       Follow up with [name], MD.   Specialty:  Family Medicine   Why:  Please see your PCP for close follow up    Contact information:   [address] [place] [address] [phone]      Discharge Instructions    Diet - low sodium heart healthy    Complete by:  As directed      Increase activity slowly    Complete by:  As directed            Discharge Medications   Current Discharge Medication List    CONTINUE these medications which have CHANGED   Details  amiodarone (PACERONE) 100 MG tablet Take 1 tablet (100 mg total) by mouth daily. Qty: 30 tablet, Refills: 6      CONTINUE these medications which have NOT CHANGED   Details    acetaminophen (TYLENOL) 500 MG tablet Take 2 tablets (1,000 mg total) by mouth every 6 (six) hours as needed for mild pain or fever. Qty: 30 tablet, Refills: 0    aspirin EC 81 MG tablet Take 81 mg by mouth daily.    colchicine 0.6 MG tablet Take 1 tablet (0.6 mg total) by mouth daily. Qty: 14 tablet, Refills: 0    finasteride (PROSCAR) 5 MG tablet TAKE ONE (1) TABLET EACH DAY Qty: 30 tablet, Refills: 3    fluticasone (FLONASE) 50 MCG/ACT nasal spray Place 1 spray into both nostrils daily as needed for allergies or rhinitis.    metoprolol tartrate (LOPRESSOR) 25 MG tablet Take 0.5 tablets (  12.5 mg total) by mouth 2 (two) times daily. Qty: 60 tablet, Refills: 1    potassium chloride SA (K-DUR,KLOR-CON) 20 MEQ tablet Take 1 tablet (20 mEq total) by mouth daily. Qty: 30 tablet, Refills: 1    pravastatin (PRAVACHOL) 40 MG tablet Take 1 tablet (40 mg total) by mouth daily at 6 PM. Qty: 30 tablet, Refills: 11    tamsulosin (FLOMAX) 0.4 MG CAPS capsule Take 1 capsule (0.4 mg total) by mouth daily. Qty: 90 capsule, Refills: 1    traMADol (ULTRAM) 50 MG tablet Take 1 tablet (50 mg total) by mouth every 6 (six) hours as needed for moderate pain. Qty: 20 tablet, Refills: 0    warfarin (COUMADIN) 2 MG tablet Take 0.5 tablets (1 mg total) by mouth daily at 6 PM. Or as directed Qty: 30 tablet, Refills: 1      STOP taking these medications     gabapentin (NEURONTIN) 100 MG capsule      furosemide (LASIX) 40 MG tablet             Outstanding Labs/Studies     Duration of Discharge Encounter   Greater than 30 minutes including physician time.  Patriciaann Clan, [place] PAC 12/11/2015, 3:06 PM

## 2015-12-11 NOTE — Care Management Obs Status (Signed)
[facility] NOTIFICATION   Patient Details  Name: [name] MRN: [id number] Date of Birth: 09/24/1928   Medicare Observation Status Notification Given:  Yes    Royston Bake, RN 12/11/2015, 11:35 AM

## 2015-12-11 NOTE — Discharge Instructions (Signed)
Monitor your weight every morning.  If you gain 3 pounds in 24 hours, or 5 pounds in a week, restart your lasix at 40mg  twice daily.

## 2015-12-11 NOTE — Consult Note (Signed)
   [facility] Inpatient Consult   12/11/2015  [name] 06-Jun-1929 [id number] Patient is currently active with [facility] for chronic disease management services.  Patient has been engaged by a [employer] and CSW.  Patient had just discharged home.  For questions, please contact:  Natividad Brood, RN BSN [facility] Liaison  [phone] business mobile phone Toll free office [phone]

## 2015-12-12 ENCOUNTER — Other Ambulatory Visit: Payer: Self-pay | Admitting: *Deleted

## 2015-12-12 ENCOUNTER — Telehealth: Payer: Self-pay | Admitting: Cardiology

## 2015-12-12 NOTE — Patient Outreach (Signed)
Transition of care call successful (week 1, discharged 5/8, 3 admits in 6 months).  Spoke with pt, reports feeling better, no weakness or dizziness so far.  Pt reports did not weigh today, weight yesterday in hospital 200 lbs.  Pt reports has some swelling in ankles/feet- coming from gout.   RN CM reinforced with pt importance of weighing daily/recording, calling MD for a weight gain of > 2 lbs in a day, 5 lbs in a week to which pt voiced understanding.   With pt reporting problems reading with cataracts, RN CM requested talking to spouse (on [facility] consent form) to review discharge medications.  Pt reported earlier nurse went over them with him, spouse is busy now to which RN CM discussed calling back tomorrow, pt agreed.    Plan to f/u with spouse  Tomorrow telephonically, review discharge medications.    Zara Chess.   [place] [facility]  [phone]

## 2015-12-12 NOTE — Telephone Encounter (Signed)
[name] called in wanting to know if it all right to change the Amiodarone strength from 100 mg to 200 mg due to a denial in coverage from insurance. Please assist

## 2015-12-12 NOTE — Telephone Encounter (Signed)
Advised OK to change to amiodarone 200mg  for scored tablets and to please inform patient of the change.

## 2015-12-13 ENCOUNTER — Other Ambulatory Visit: Payer: Self-pay | Admitting: *Deleted

## 2015-12-13 DIAGNOSIS — M109 Gout, unspecified: Secondary | ICD-10-CM | POA: Diagnosis not present

## 2015-12-13 DIAGNOSIS — Z48812 Encounter for surgical aftercare following surgery on the circulatory system: Secondary | ICD-10-CM | POA: Diagnosis not present

## 2015-12-13 DIAGNOSIS — N183 Chronic kidney disease, stage 3 (moderate): Secondary | ICD-10-CM | POA: Diagnosis not present

## 2015-12-13 DIAGNOSIS — I2510 Atherosclerotic heart disease of native coronary artery without angina pectoris: Secondary | ICD-10-CM | POA: Diagnosis not present

## 2015-12-13 DIAGNOSIS — E1122 Type 2 diabetes mellitus with diabetic chronic kidney disease: Secondary | ICD-10-CM | POA: Diagnosis not present

## 2015-12-13 DIAGNOSIS — I130 Hypertensive heart and chronic kidney disease with heart failure and stage 1 through stage 4 chronic kidney disease, or unspecified chronic kidney disease: Secondary | ICD-10-CM | POA: Diagnosis not present

## 2015-12-13 DIAGNOSIS — E785 Hyperlipidemia, unspecified: Secondary | ICD-10-CM | POA: Diagnosis not present

## 2015-12-13 DIAGNOSIS — D649 Anemia, unspecified: Secondary | ICD-10-CM | POA: Diagnosis not present

## 2015-12-13 DIAGNOSIS — I482 Chronic atrial fibrillation: Secondary | ICD-10-CM | POA: Diagnosis not present

## 2015-12-13 DIAGNOSIS — R1312 Dysphagia, oropharyngeal phase: Secondary | ICD-10-CM | POA: Diagnosis not present

## 2015-12-13 DIAGNOSIS — M1991 Primary osteoarthritis, unspecified site: Secondary | ICD-10-CM | POA: Diagnosis not present

## 2015-12-13 DIAGNOSIS — I5032 Chronic diastolic (congestive) heart failure: Secondary | ICD-10-CM | POA: Diagnosis not present

## 2015-12-13 DIAGNOSIS — I272 Other secondary pulmonary hypertension: Secondary | ICD-10-CM | POA: Diagnosis not present

## 2015-12-13 NOTE — Patient Outreach (Signed)
Follow up phone call for medication reconciliation on pt.   Spoke with spouse (on [facility] consent form), discussed pt requested a call back today, talk to her about his discharge medications.  Spouse reports nurse just left, went over pt's medications.   Medication reconciliation was performed- current medication and discharge medications were reviewed.    [name].   [place] [place] [facility]  [phone]  Patient was recently discharged from hospital and all medications have been reviewed.

## 2015-12-14 ENCOUNTER — Other Ambulatory Visit: Payer: Self-pay | Admitting: *Deleted

## 2015-12-14 NOTE — Patient Outreach (Signed)
[facility]) Care Management  12/14/2015  RAEDYN CENTEIO 1929/05/08 [id number]   Phone call to patient to assess for continued social work needs.  HIPPA compliant voicemail message left for a return call.    [name] [facility] [phone]

## 2015-12-18 ENCOUNTER — Ambulatory Visit (INDEPENDENT_AMBULATORY_CARE_PROVIDER_SITE_OTHER): Payer: Medicare Other | Admitting: Family Medicine

## 2015-12-18 ENCOUNTER — Encounter: Payer: Self-pay | Admitting: Family Medicine

## 2015-12-18 VITALS — BP 112/60 | HR 60 | Temp 97.9°F | Resp 16 | Ht 67.0 in | Wt 200.0 lb

## 2015-12-18 DIAGNOSIS — R296 Repeated falls: Secondary | ICD-10-CM | POA: Diagnosis not present

## 2015-12-18 DIAGNOSIS — R42 Dizziness and giddiness: Secondary | ICD-10-CM

## 2015-12-18 DIAGNOSIS — R561 Post traumatic seizures: Secondary | ICD-10-CM

## 2015-12-18 DIAGNOSIS — R55 Syncope and collapse: Secondary | ICD-10-CM

## 2015-12-18 NOTE — Progress Notes (Signed)
Subjective:    Patient ID: [name], male    DOB: 1928-08-29, 82 y.o.   MRN: [id number]  HPI   Was recently admitted to the hospital at the end of March with acute congestive heart failure. I have copied relevant portions of the discharge summary and included them below for my reference:    Admit date: 10/17/2015 Discharge date: 10/31/2015  Primary Care Provider: [facility] Primary Cardiologist: Dr. Percival Spanish  Discharge Diagnoses   Principal Problem:  Acute on chronic diastolic CHF Active Problems:  Hypertension  Heart failure (HCC)  Pressure ulcer  Paroxysmal Atrial fibrillation  Altered mental status  Elevated LFTs and coagulopathy  Coronary artery disease s/p DES to LAD and RCA in 4/16  Normocytic anemia.  Chronic subdural hematoma   R groin wound  Dysphagia  R ankle pain  AKI  S/p mini MV repair on 10/04/2015  Allergies No Known Allergies  Diagnostic Studies/Procedures      History of Present Illness    [name] is an 82 yo white male with long-standing history of mitral valve prolapse with mitral regurgitation and chronic diastolic congestive heart failure, coronary artery disease status post PCI and stenting of the LAD and RCA using drug-eluting stents, recurrent SVT s/p DCCV, pulmonary hypertension, type 2 diabetes mellitus, hyperlipidemia, and chronic back pain who recently underwent minimally invasive Mitral Valve Repair 10/04/15 and presented [facility] ED with SOB and LE edema 10/17/15.   Patient recently underwent a minimally-invasive mitral valve repair on 3/1 and his post-op course was complicated by LVOT obstruction due to [facility], which resolved with adjustment of inotropic medications. He developed atrial fibrillation and was put on amiodarone and Coumadin. He was discharged on 3/10 to home.  Upon presentation, the patient was altered on exam and not able to answer questions consistently, so history was obtained  from speaking to the wife on the phone [phone]) and chart review. Per wife, he has been very confused and not himself since dischage. He has been talking all night, not making any sense. He gets up constantly to urinate, but nothing comes out. His personality is different, as well, he has been uncharacteristically rude to his wife, telling her she was the devil. She notes that he can't swallow and easily chokes, even a small pill, since being home. His wife has been giving him his meds in apple sauce. He hasn't really ate anything of substance since coming home. He has been taking his Coumadin since discharge. His wife also notes that his LE edema has become significantly worse since discharge a few days ago.  In ED - BNP 763; Troponin (POC) 0.44; INR 6.13 (up from 3.66 on 3/10 at discharge)  Bedside sonosite echo performed by cardiology fellow was technically difficult, revealed EF >55%, possible RV dilation and low normal function. No significant mitral regurgitation was appreciated. Left atrial enlargement. Small pericardial effusion without evidence of tamponade.   Hospital Course    Consultants: Cardiothoracic Surgery, urology, would care  1. Acute on chronic diastolic CHF - significant - however tricky situation, not good response to iv lasix, soft BP and LVOT obstruction, we don't want to replete intravascular volume. Felt better after DCCV and maintaining sinus rhythm. Diuresed with IV lasix and zaroxolyn then diuretics were hold due to elevated creatinine. Leading to minimal improvement of Scr. It was felt that managing his volume status would be hard for the remainder of his life. I&O negative 2.3L with 24lb weight loss (220-->196lb). Uncertain about I/O  accuracy and weight. BP soft low. Reduced metoprolol to 12.12m BID.   2. Altered mental status/Subdural hematoma.  Given INR >6, concern for possible intracranial bleed, CT head showed 14 mm RIGHT holo hemispheric  chronic subdural hematoma without midline shift (minimally increased from prior).-- check ammonia level was 24 (WNL). Improved AMS with improvement of INR.   3. Elevated LFTs: . Likely due to hepatic congestion, although more of an obstructive pattern, with alk phos and total bili elevated and minimal transaminitis. Less likely due to amiodarone, which was recently initiated. Abdominal ultrasound showed stones and sludge within GB and GB wall thickening, possible mild hepatic cirrhosis, minimal ascites. LFT improved with diuresis.  4. Coagulopathy: Held Coumadin as INR of > 6. Given Vitamin K, even though his INR is < 10 and he has no overt bleeding, he is at greater risk of bleeding (age, decompensated heart failure, and expanding chronic subdural hematoma on head CT). Placed on heparin when INR was less than 2 for anticoagulation. Coumadin managed by pharmacy. Discharge dose adjusted by pharmacy and recheck INR in 1 week.   5. Paroxysmal Atrial fibrillation. Rate was controlled. Started on IV heparin for anticoagulation. Subsequently underwent DCCV and maintained sinus rhythm throughout. Post DCCV restarted on coumadin. hed has been adequately loaded with amiodarone 400 mg twice a day since 10/13/15 to 10/29/15. Reduced to 2058mQD as maintenance dose. After proximally 1 month if he remains in sinus rhythm, advocate discontinuation of amiodarone.  6. Leukocytosis. Normal differential. No other objective evidence of infection, likely reactive in setting of acute illness. Surgical incisions appear to be healing well. CXR was concerning for PNA and started on Ceftriaxone and Vancomycin .   7. Hematuria and UTI: hx of prostate cancer: Initial UA was clear. The patient has urinary retention at home after recent discharge. Demmed foley was placed in ED. Than he has UTI and bloody urine in foley bag. Ceftriaxone will treat it. Seen by Urology for hematuria which removed foley with resolution of hematuria.     8. Coronary artery disease s/p DES to LAD and RCA in 4/16. EKG without acute ischemic changes. Troponin is mildly elevated (0.55-->0.41), but this is likely demand in the setting of volume overload. Was not discharged on DAPT, but he is almost 1 year out from intervention, so low risk for stent thrombosis. Continued aspirin 81 mg, as there is no overt signs of bleeding. Continue pravastatin (unclear why he is not on a high-potency statin, but this can be addressed by his primary cardiologist)  9. Normocytic anemia. No significant difference from baseline while in the hospital, monitored closely in the setting of elevated INR. Remained stable.   10 AKI. Scr worsened with diuretics and initiation of colchicine. Recheck BMET as outpatient.   11. R ankle pain/ possible gout: Uric acid was elevated. Tx with colchicine. Improved. Decreased dose due to elevated creatinine. Discontinue colchicine during office visit. F/u with PCP.   12. Right groin wound: superficially dehisced and slight yellow slough. Followed by wound care. Plan made to keep it dry. Will followed by Surgery closely and will schedule f/u appointment.   13. Dysphagia: underwent speech evaluation. Recommended Nectar-thick liquid.   The patient has been seen by Dr. SkMarlou Porchnd Dr.Owen today and deemed ready for discharge home. All follow-up appointments have been scheduled. Discharge medications are listed below.   Will resume lasix 2047mO QD. Decrease KCL to 7m68mD. BMET in week. Would care f/u with TCTS. INR check in week.  12/01/15 He has already followed up with Dr. Ricard Dillon.  I have reviewed that office visit. No changes were made in his medication. At the present time he does have +1 pitting edema in his right leg. He only has trace pitting edema in his left leg. For this patient that is actually very good. He appears to be euvolemic. He is in normal sinus rhythm with a systolic ejection murmur but there are no crackles or  evidence of pulmonary edema on his pulmonary exam. He denies any trouble breathing. His INR was subtherapeutic earlier this week but the cardiologist is only changed his Coumadin dose and they're scheduled follow-up next week. He has an appointment to see his cardiologist on May 5. His renal function was checked a little more than a week ago and was normal. His primary concern today is persistent pain radiating into his left leg. The pain is located on the anterior portion of his left thigh. It radiates from his lower back down the anterior left thigh to his knee. It is burning and stinging. It is constant. It radiates and throbs. There is no exacerbating or alleviating factors. Review of his MRI of his lumbar spine 2010 revealed severe spinal stenosis and also by foraminal stenosis worse at L5-S1 all of which could cause lumbar radiculopathy.  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 Begin gabapentin 100 mg by mouth daily and gradually increased to 100 mg by mouth 3 times a day. Monitor for sedation and confusion. Recheck next week or sooner if worse.   12/18/15 Since I last saw the patient, he has been admitted to the hospital with "weakness". Patient is a difficult historian, however his symptoms sound like orthostatic dizziness and weakness and presyncope. Whenever he is walking or stands from a seated position, he will suddenly become lightheaded and have to slump to the floor to avoid falling. There have been episodes of seizure-like activity which were deemed to be due to cerebral hypoperfusion secondary to low blood pressure. Patient denies any loss of consciousness although he has had what sounds like two witnessed generalized tonic-clonic episodes secondary to cerebral hypoperfusion. Gabapentin was held in the hospital however he continues to have orthostatic dizziness. Of note, the patient has recently been found to have a subdural hematoma which could put him at higher risk  for seizures.  This was discovered on a CT scan of the head in March as dictated below: 14 mm RIGHT holo hemispheric chronic subdural hematoma without midline shift.  Past Medical History  Diagnosis Date  . Coronary artery disease     a. 11/2014 NSTEMI: DESx 2 placed to LAD and RCA   . Mitral valve prolapse   . Diabetes mellitus, type II ([place])     a. HgA1c 6.8 in 03/2015  . Mitral regurgitation     a. severe wtih flail leaflet by 2D ECHO (06/05/15)  . SVT (supraventricular tachycardia) (HCC)     a. recurrent, usually responsive to vagal manuevers  . HTN (hypertension)   . Pulmonary HTN ([place])   . Lower extremity edema     a. chronic  . HLD (hyperlipidemia)   . Hypertension   . OA (osteoarthritis)   . ED (erectile dysfunction)   . Chronic fatigue   . Gout   . Spinal stenosis   . Prostate cancer ([place])   . Severe mitral regurgitation 11/17/2014    a.  s/p minimally invasive MV repair (10/2015)  . Chronic diastolic (congestive) heart  failure ([place])   . Hemangioma of liver 08/02/2015    12 mm enhancing lesion seen on CT - suspicious for benign hemangioma but not diagnostic - MRI recommended  . Kidney stones   . Physical deconditioning   . S/P minimally invasive mitral valve repair 10/04/2015    a. Complex valvuloplasty including artificial Gore-tex neochord placement x6 with plication closure of cleft between P2 and P3 and 30 mm Sorin Memo 3D ring annuloplasty via right mini thoracotomy approach   . PAF (paroxysmal atrial fibrillation) (HCC)     a. on coumadin   . CAD (coronary artery disease)     a. 11/2014  DES to LAD and RCA  . Orthostatic hypotension    Past Surgical History  Procedure Laterality Date  . Cardiac surgery      2 cardiac stents  . Joint replacement      shoulder  . Back surgery x 2    . Rt rotator cuff repair    . Cervical spine surgery    . Back surgery    . Left heart catheterization with coronary angiogram N/A 11/16/2014    Procedure: LEFT HEART  CATHETERIZATION WITH CORONARY ANGIOGRAM;  Surgeon: Troy Sine, MD;  Location: [facility] CATH LAB;  Service: Cardiovascular;  Laterality: N/A;  . Tee without cardioversion N/A 06/22/2015    Procedure: TRANSESOPHAGEAL ECHOCARDIOGRAM (TEE);  Surgeon: Skeet Latch, MD;  Location: [place];  Service: Cardiovascular;  Laterality: N/A;  . Cardiac catheterization N/A 08/08/2015    Procedure: Right/Left Heart Cath and Coronary Angiography;  Surgeon: Sherren Mocha, MD;  Location: [place] CV LAB;  Service: Cardiovascular;  Laterality: N/A;  . Eye surgery    . Mitral valve repair Right 10/04/2015    Procedure: MINIMALLY INVASIVE MITRAL VALVE REPAIR (MVR);  Surgeon: Rexene Alberts, MD;  Location: [place];  Service: Open Heart Surgery;  Laterality: Right;  . Tee without cardioversion N/A 10/04/2015    Procedure: TRANSESOPHAGEAL ECHOCARDIOGRAM (TEE);  Surgeon: Rexene Alberts, MD;  Location: [place];  Service: Open Heart Surgery;  Laterality: N/A;  . Wound exploration N/A 10/05/2015    Procedure: Sternal Incision;  Surgeon: Rexene Alberts, MD;  Location: [place];  Service: Open Heart Surgery;  Laterality: N/A;  . Intraoperative transesophageal echocardiogram  10/05/2015    Procedure: INTRAOPERATIVE TRANSESOPHAGEAL ECHOCARDIOGRAM;  Surgeon: Rexene Alberts, MD;  Location: [place];  Service: Open Heart Surgery;;  . Cardioversion N/A 10/20/2015    Procedure: CARDIOVERSION;  Surgeon: Pixie Casino, MD;  Location: [facility] ENDOSCOPY;  Service: Cardiovascular;  Laterality: N/A;   Current Outpatient Prescriptions on File Prior to Visit  Medication Sig Dispense Refill  . amiodarone (PACERONE) 100 MG tablet Take 1 tablet (100 mg total) by mouth daily. 30 tablet 6  . aspirin EC 81 MG tablet Take 81 mg by mouth daily.    . colchicine 0.6 MG tablet Take 1 tablet (0.6 mg total) by mouth daily. 14 tablet 0  . finasteride (PROSCAR) 5 MG tablet TAKE ONE (1) TABLET EACH DAY 30 tablet 3  . fluticasone (FLONASE) 50 MCG/ACT nasal spray  Place 1 spray into both nostrils daily as needed for allergies or rhinitis.    . furosemide (LASIX) 40 MG tablet Take 40 mg by mouth. 40 mg twice a day    . metoprolol tartrate (LOPRESSOR) 25 MG tablet Take 0.5 tablets (12.5 mg total) by mouth 2 (two) times daily. 60 tablet 1  . potassium chloride SA (K-DUR,KLOR-CON) 20 MEQ tablet Take 1  tablet (20 mEq total) by mouth daily. 30 tablet 1  . pravastatin (PRAVACHOL) 40 MG tablet Take 1 tablet (40 mg total) by mouth daily at 6 PM. (Patient taking differently: Take 40 mg by mouth daily. ) 30 tablet 11  . tamsulosin (FLOMAX) 0.4 MG CAPS capsule Take 1 capsule (0.4 mg total) by mouth daily. 90 capsule 1  . traMADol (ULTRAM) 50 MG tablet Take 1 tablet (50 mg total) by mouth every 6 (six) hours as needed for moderate pain. 20 tablet 0  . warfarin (COUMADIN) 2 MG tablet Take 0.5 tablets (1 mg total) by mouth daily at 6 PM. Or as directed (Patient taking differently: Take 1 mg by mouth daily at 6 PM. 2 mg Monday- Friday. 1 mg all other days.) 30 tablet 1   No current facility-administered medications on file prior to visit.   No Known Allergies Social History   Social History  . Marital Status: Married    Spouse Name: N/A  . Number of Children: 4  . Years of Education: N/A   Occupational History  .     Social History Main Topics  . Smoking status: Former Research scientist (life sciences)  . Smokeless tobacco: Never Used     Comment: QUIT SMOKING  MANY YEARS AGO"  . Alcohol Use: No  . Drug Use: No  . Sexual Activity: Not on file   Other Topics Concern  . Not on file   Social History Narrative   ** Merged History Encounter **       Four adopted children.  Lives alone.      Review of Systems  All other systems reviewed and are negative.      Objective:   Physical Exam  Neck: Neck supple. No JVD present.  Cardiovascular: Normal rate and regular rhythm.   Murmur heard. Pulmonary/Chest: Effort normal. He has no wheezes. He has no rales.  Abdominal: Soft.  Bowel sounds are normal.  Musculoskeletal: He exhibits edema.  Lymphadenopathy:    He has no cervical adenopathy.  Neurological: He has normal reflexes. No cranial nerve deficit. He exhibits normal muscle tone.  Vitals reviewed.   Lying down, the patient's blood pressure is 110/70 with a heart rate of 63. Sitting his blood pressure is 110/70 with a heart rate of 64 standing his blood pressure drops to 96/62 with a heart rate of 60.  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 He is not truly orthostatic today on exam although his history sounds very much like that. I believe we need to take steps to minimize orthostatic dizziness. First, discontinue Flomax as this can certainly can contribute. Based on the recent hospital discharge summary it sounds like the patient may have had an episode of bradycardia.  He is currently on metoprolol 12.5 mg by mouth twice a day. This is partly due to atrial fibrillation and partly due to his coronary artery disease. I'm going to temporarily discontinue the metoprolol to see if his symptoms will improve. I would have a low threshold to put the patient back on a beta blocker given his significant cardiovascular history but I will see if his symptoms will improve off of the beta blocker. I'm also going to consult neurology for  possible EEG to see if he could be experiencing seizure activity as well as drop attacks due to seizure activity particular given his subdural hematoma. Recheck in one week.  His weight also continues to drop and there is no evidence of peripheral edema or pulmonary edema on exam today so I'm going to decrease his Lasix to 20 mg by mouth twice a day in an effort to avoid cerebral hypoperfusion. Recheck in one  week or immediately if worse

## 2015-12-20 ENCOUNTER — Other Ambulatory Visit: Payer: Self-pay | Admitting: *Deleted

## 2015-12-20 NOTE — Patient Outreach (Signed)
Transition of care call successful (week 2, discharged 5/8).  Pt reports getting better, balance set was off.  Pt reports he stopped using his walker yesterday but still needs  HH PT to work with his shoulder.  Pt reports they were suppose to call him, plan to call them today.  RN CM discussed with pt recent office visit with Dr. Dennard Schaumann- change to medications to which pt acknowledged did stop Flomax, Metoprolol, Lasix decreased to 20 mg twice a day.  Pt reports feeling better with medication changes.   Pt reports right now has no swelling in legs, weight yesterday was 195 lbs.  Pt reports to f/u with Dr. Dennard Schaumann 5/22.   RN CM discussed with pt doing a home visit to which pt agreed.  Plan to f/u with pt again 5/26- home visit (part of ongoing transition of care).    Zara Chess.   [facility]  [phone]

## 2015-12-21 ENCOUNTER — Telehealth: Payer: Self-pay | Admitting: Family Medicine

## 2015-12-21 ENCOUNTER — Inpatient Hospital Stay: Payer: Self-pay | Admitting: Family Medicine

## 2015-12-21 MED ORDER — ONDANSETRON HCL 4 MG PO TABS: 4.0000 mg | ORAL_TABLET | Freq: Three times a day (TID) | ORAL | Status: DC | PRN

## 2015-12-21 NOTE — Telephone Encounter (Signed)
This is a new problem.  All I did was stop meds at last ov.  IF belching and nauseated, could he have a bowel obstruction?  If obstructed (no bm, no flatus, pain) go to er.  Is he constipated?  If so, begin miralax bid and zofran 4 mg poq8 hrs prn nausea.  NTBS if worse.

## 2015-12-21 NOTE — Telephone Encounter (Signed)
Spoke to patient.  He is moving his bowels.  Said twice a day, a little loose.  Did sent zofran in for nausea.  Has appt here Monday

## 2015-12-21 NOTE — Telephone Encounter (Signed)
Patients wife betty calling to say that since his visit Monday, he has not been able to eat at all please call [phone]

## 2015-12-21 NOTE — Telephone Encounter (Signed)
Is not eating.  No appetite, nausea.  Taking in very little followed by dry heaves and belching.

## 2015-12-25 ENCOUNTER — Encounter: Payer: Self-pay | Admitting: Family Medicine

## 2015-12-25 ENCOUNTER — Ambulatory Visit (INDEPENDENT_AMBULATORY_CARE_PROVIDER_SITE_OTHER): Payer: Medicare Other | Admitting: Family Medicine

## 2015-12-25 VITALS — BP 130/68 | HR 80 | Temp 97.5°F | Resp 16 | Ht 67.0 in | Wt 200.0 lb

## 2015-12-25 DIAGNOSIS — R296 Repeated falls: Secondary | ICD-10-CM

## 2015-12-25 DIAGNOSIS — R42 Dizziness and giddiness: Secondary | ICD-10-CM

## 2015-12-25 DIAGNOSIS — R55 Syncope and collapse: Secondary | ICD-10-CM

## 2015-12-25 NOTE — Progress Notes (Signed)
Subjective:    Patient ID: [name], male    DOB: 02-Jul-1929, 82 y.o.   MRN: [id number]  HPI   Was recently admitted to the hospital at the end of March with acute congestive heart failure. I have copied relevant portions of the discharge summary and included them below for my reference:    Admit date: 10/17/2015 Discharge date: 10/31/2015  Primary Care Provider: [facility] Primary Cardiologist: Dr. Percival Spanish  Discharge Diagnoses   Principal Problem:  Acute on chronic diastolic CHF Active Problems:  Hypertension  Heart failure (HCC)  Pressure ulcer  Paroxysmal Atrial fibrillation  Altered mental status  Elevated LFTs and coagulopathy  Coronary artery disease s/p DES to LAD and RCA in 4/16  Normocytic anemia.  Chronic subdural hematoma   R groin wound  Dysphagia  R ankle pain  AKI  S/p mini MV repair on 10/04/2015  Allergies No Known Allergies  Diagnostic Studies/Procedures      History of Present Illness    [name] is an 82 yo white male with long-standing history of mitral valve prolapse with mitral regurgitation and chronic diastolic congestive heart failure, coronary artery disease status post PCI and stenting of the LAD and RCA using drug-eluting stents, recurrent SVT s/p DCCV, pulmonary hypertension, type 2 diabetes mellitus, hyperlipidemia, and chronic back pain who recently underwent minimally invasive Mitral Valve Repair 10/04/15 and presented [facility] ED with SOB and LE edema 10/17/15.   Patient recently underwent a minimally-invasive mitral valve repair on 3/1 and his post-op course was complicated by LVOT obstruction due to [facility], which resolved with adjustment of inotropic medications. He developed atrial fibrillation and was put on amiodarone and Coumadin. He was discharged on 3/10 to home.  Upon presentation, the patient was altered on exam and not able to answer questions consistently, so history was obtained  from speaking to the wife on the phone [phone]) and chart review. Per wife, he has been very confused and not himself since dischage. He has been talking all night, not making any sense. He gets up constantly to urinate, but nothing comes out. His personality is different, as well, he has been uncharacteristically rude to his wife, telling her she was the devil. She notes that he can't swallow and easily chokes, even a small pill, since being home. His wife has been giving him his meds in apple sauce. He hasn't really ate anything of substance since coming home. He has been taking his Coumadin since discharge. His wife also notes that his LE edema has become significantly worse since discharge a few days ago.  In ED - BNP 763; Troponin (POC) 0.44; INR 6.13 (up from 3.66 on 3/10 at discharge)  Bedside sonosite echo performed by cardiology fellow was technically difficult, revealed EF >55%, possible RV dilation and low normal function. No significant mitral regurgitation was appreciated. Left atrial enlargement. Small pericardial effusion without evidence of tamponade.   Hospital Course    Consultants: Cardiothoracic Surgery, urology, would care  1. Acute on chronic diastolic CHF - significant - however tricky situation, not good response to iv lasix, soft BP and LVOT obstruction, we don't want to replete intravascular volume. Felt better after DCCV and maintaining sinus rhythm. Diuresed with IV lasix and zaroxolyn then diuretics were hold due to elevated creatinine. Leading to minimal improvement of Scr. It was felt that managing his volume status would be hard for the remainder of his life. I&O negative 2.3L with 24lb weight loss (220-->196lb). Uncertain about I/O  accuracy and weight. BP soft low. Reduced metoprolol to 12.39m BID.   2. Altered mental status/Subdural hematoma.  Given INR >6, concern for possible intracranial bleed, CT head showed 14 mm RIGHT holo hemispheric  chronic subdural hematoma without midline shift (minimally increased from prior).-- check ammonia level was 24 (WNL). Improved AMS with improvement of INR.   3. Elevated LFTs: . Likely due to hepatic congestion, although more of an obstructive pattern, with alk phos and total bili elevated and minimal transaminitis. Less likely due to amiodarone, which was recently initiated. Abdominal ultrasound showed stones and sludge within GB and GB wall thickening, possible mild hepatic cirrhosis, minimal ascites. LFT improved with diuresis.  4. Coagulopathy: Held Coumadin as INR of > 6. Given Vitamin K, even though his INR is < 10 and he has no overt bleeding, he is at greater risk of bleeding (age, decompensated heart failure, and expanding chronic subdural hematoma on head CT). Placed on heparin when INR was less than 2 for anticoagulation. Coumadin managed by pharmacy. Discharge dose adjusted by pharmacy and recheck INR in 1 week.   5. Paroxysmal Atrial fibrillation. Rate was controlled. Started on IV heparin for anticoagulation. Subsequently underwent DCCV and maintained sinus rhythm throughout. Post DCCV restarted on coumadin. hed has been adequately loaded with amiodarone 400 mg twice a day since 10/13/15 to 10/29/15. Reduced to 2026mQD as maintenance dose. After proximally 1 month if he remains in sinus rhythm, advocate discontinuation of amiodarone.  6. Leukocytosis. Normal differential. No other objective evidence of infection, likely reactive in setting of acute illness. Surgical incisions appear to be healing well. CXR was concerning for PNA and started on Ceftriaxone and Vancomycin .   7. Hematuria and UTI: hx of prostate cancer: Initial UA was clear. The patient has urinary retention at home after recent discharge. Demmed foley was placed in ED. Than he has UTI and bloody urine in foley bag. Ceftriaxone will treat it. Seen by Urology for hematuria which removed foley with resolution of hematuria.     8. Coronary artery disease s/p DES to LAD and RCA in 4/16. EKG without acute ischemic changes. Troponin is mildly elevated (0.55-->0.41), but this is likely demand in the setting of volume overload. Was not discharged on DAPT, but he is almost 1 year out from intervention, so low risk for stent thrombosis. Continued aspirin 81 mg, as there is no overt signs of bleeding. Continue pravastatin (unclear why he is not on a high-potency statin, but this can be addressed by his primary cardiologist)  9. Normocytic anemia. No significant difference from baseline while in the hospital, monitored closely in the setting of elevated INR. Remained stable.   10 AKI. Scr worsened with diuretics and initiation of colchicine. Recheck BMET as outpatient.   11. R ankle pain/ possible gout: Uric acid was elevated. Tx with colchicine. Improved. Decreased dose due to elevated creatinine. Discontinue colchicine during office visit. F/u with PCP.   12. Right groin wound: superficially dehisced and slight yellow slough. Followed by wound care. Plan made to keep it dry. Will followed by Surgery closely and will schedule f/u appointment.   13. Dysphagia: underwent speech evaluation. Recommended Nectar-thick liquid.   The patient has been seen by Dr. SkMarlou Porchnd Dr.Owen today and deemed ready for discharge home. All follow-up appointments have been scheduled. Discharge medications are listed below.   Will resume lasix 2044mO QD. Decrease KCL to 68m71mD. BMET in week. Would care f/u with TCTS. INR check in week.  12/01/15 He has already followed up with Dr. Ricard Dillon.  I have reviewed that office visit. No changes were made in his medication. At the present time he does have +1 pitting edema in his right leg. He only has trace pitting edema in his left leg. For this patient that is actually very good. He appears to be euvolemic. He is in normal sinus rhythm with a systolic ejection murmur but there are no crackles or  evidence of pulmonary edema on his pulmonary exam. He denies any trouble breathing. His INR was subtherapeutic earlier this week but the cardiologist is only changed his Coumadin dose and they're scheduled follow-up next week. He has an appointment to see his cardiologist on May 5. His renal function was checked a little more than a week ago and was normal. His primary concern today is persistent pain radiating into his left leg. The pain is located on the anterior portion of his left thigh. It radiates from his lower back down the anterior left thigh to his knee. It is burning and stinging. It is constant. It radiates and throbs. There is no exacerbating or alleviating factors. Review of his MRI of his lumbar spine 2010 revealed severe spinal stenosis and also by foraminal stenosis worse at L5-S1 all of which could cause lumbar radiculopathy.  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 Begin gabapentin 100 mg by mouth daily and gradually increased to 100 mg by mouth 3 times a day. Monitor for sedation and confusion. Recheck next week or sooner if worse.   12/18/15 Since I last saw the patient, he has been admitted to the hospital with "weakness". Patient is a difficult historian, however his symptoms sound like orthostatic dizziness and weakness and presyncope. Whenever he is walking or stands from a seated position, he will suddenly become lightheaded and have to slump to the floor to avoid falling. There have been episodes of seizure-like activity which were deemed to be due to cerebral hypoperfusion secondary to low blood pressure. Patient denies any loss of consciousness although he has had what sounds like two witnessed generalized tonic-clonic episodes secondary to cerebral hypoperfusion. Gabapentin was held in the hospital however he continues to have orthostatic dizziness. Of note, the patient has recently been found to have a subdural hematoma which could put him at higher risk  for seizures.  This was discovered on a CT scan of the head in March as dictated below: 14 mm RIGHT holo hemispheric chronic subdural hematoma without midline shift. At that time, my plan was: This is a difficult case. He is not truly orthostatic today on exam although his history sounds very much like that. I believe we need to take steps to minimize orthostatic dizziness. First, discontinue Flomax as this can certainly can contribute. Based on the recent hospital discharge summary it sounds like the patient may have had an episode of bradycardia.  He is currently on metoprolol 12.5 mg by mouth twice a day. This is partly due to atrial fibrillation and partly due to his coronary artery disease. I'm going to temporarily discontinue the metoprolol to see if his symptoms will improve. I would have a low threshold to put the patient back on a beta blocker given his significant cardiovascular history but I will see if his symptoms will improve off of the beta blocker. I'm also going to consult neurology for possible EEG to see if he could be experiencing seizure activity as well as drop attacks due to seizure  activity particular given his subdural hematoma. Recheck in one week.  His weight also continues to drop and there is no evidence of peripheral edema or pulmonary edema on exam today so I'm going to decrease his Lasix to 20 mg by mouth twice a day in an effort to avoid cerebral hypoperfusion. Recheck in one week or immediately if worse   12/25/15 Patient states that he feels much better. He no longer feels weak and lightheaded. He denies any further orthostatic syncope or near syncope. He denies any further falls. He is starting to ambulate more without difficulty. His weight is unchanged. On examination today he has no pedal edema and his lungs are clear. His heart rate is within normal limits  Past Medical History  Diagnosis Date  . Coronary artery disease     a. 11/2014 NSTEMI: DESx 2 placed to LAD  and RCA   . Mitral valve prolapse   . Diabetes mellitus, type II ([place])     a. HgA1c 6.8 in 03/2015  . Mitral regurgitation     a. severe wtih flail leaflet by 2D ECHO (06/05/15)  . SVT (supraventricular tachycardia) (HCC)     a. recurrent, usually responsive to vagal manuevers  . HTN (hypertension)   . Pulmonary HTN ([place])   . Lower extremity edema     a. chronic  . HLD (hyperlipidemia)   . Hypertension   . OA (osteoarthritis)   . ED (erectile dysfunction)   . Chronic fatigue   . Gout   . Spinal stenosis   . Prostate cancer ([place])   . Severe mitral regurgitation 11/17/2014    a.  s/p minimally invasive MV repair (10/2015)  . Chronic diastolic (congestive) heart failure ([place])   . Hemangioma of liver 08/02/2015    12 mm enhancing lesion seen on CT - suspicious for benign hemangioma but not diagnostic - MRI recommended  . Kidney stones   . Physical deconditioning   . S/P minimally invasive mitral valve repair 10/04/2015    a. Complex valvuloplasty including artificial Gore-tex neochord placement x6 with plication closure of cleft between P2 and P3 and 30 mm Sorin Memo 3D ring annuloplasty via right mini thoracotomy approach   . PAF (paroxysmal atrial fibrillation) (HCC)     a. on coumadin   . CAD (coronary artery disease)     a. 11/2014  DES to LAD and RCA  . Orthostatic hypotension    Past Surgical History  Procedure Laterality Date  . Cardiac surgery      2 cardiac stents  . Joint replacement      shoulder  . Back surgery x 2    . Rt rotator cuff repair    . Cervical spine surgery    . Back surgery    . Left heart catheterization with coronary angiogram N/A 11/16/2014    Procedure: LEFT HEART CATHETERIZATION WITH CORONARY ANGIOGRAM;  Surgeon: Troy Sine, MD;  Location: [facility] CATH LAB;  Service: Cardiovascular;  Laterality: N/A;  . Tee without cardioversion N/A 06/22/2015    Procedure: TRANSESOPHAGEAL ECHOCARDIOGRAM (TEE);  Surgeon: Skeet Latch, MD;  Location: [place];   Service: Cardiovascular;  Laterality: N/A;  . Cardiac catheterization N/A 08/08/2015    Procedure: Right/Left Heart Cath and Coronary Angiography;  Surgeon: Sherren Mocha, MD;  Location: [place] CV LAB;  Service: Cardiovascular;  Laterality: N/A;  . Eye surgery    . Mitral valve repair Right 10/04/2015    Procedure: MINIMALLY INVASIVE MITRAL VALVE REPAIR (MVR);  Surgeon:  Rexene Alberts, MD;  Location: [place];  Service: Open Heart Surgery;  Laterality: Right;  . Tee without cardioversion N/A 10/04/2015    Procedure: TRANSESOPHAGEAL ECHOCARDIOGRAM (TEE);  Surgeon: Rexene Alberts, MD;  Location: [place];  Service: Open Heart Surgery;  Laterality: N/A;  . Wound exploration N/A 10/05/2015    Procedure: Sternal Incision;  Surgeon: Rexene Alberts, MD;  Location: [place];  Service: Open Heart Surgery;  Laterality: N/A;  . Intraoperative transesophageal echocardiogram  10/05/2015    Procedure: INTRAOPERATIVE TRANSESOPHAGEAL ECHOCARDIOGRAM;  Surgeon: Rexene Alberts, MD;  Location: [place];  Service: Open Heart Surgery;;  . Cardioversion N/A 10/20/2015    Procedure: CARDIOVERSION;  Surgeon: Pixie Casino, MD;  Location: [facility] ENDOSCOPY;  Service: Cardiovascular;  Laterality: N/A;   Current Outpatient Prescriptions on File Prior to Visit  Medication Sig Dispense Refill  . amiodarone (PACERONE) 100 MG tablet Take 1 tablet (100 mg total) by mouth daily. 30 tablet 6  . aspirin EC 81 MG tablet Take 81 mg by mouth daily.    . colchicine 0.6 MG tablet Take 1 tablet (0.6 mg total) by mouth daily. 14 tablet 0  . finasteride (PROSCAR) 5 MG tablet TAKE ONE (1) TABLET EACH DAY 30 tablet 3  . fluticasone (FLONASE) 50 MCG/ACT nasal spray Place 1 spray into both nostrils daily as needed for allergies or rhinitis.    . furosemide (LASIX) 40 MG tablet Take 40 mg by mouth. 40 mg twice a day    . metoprolol tartrate (LOPRESSOR) 25 MG tablet Take 0.5 tablets (12.5 mg total) by mouth 2 (two) times daily. 60 tablet 1  . ondansetron  (ZOFRAN) 4 MG tablet Take 1 tablet (4 mg total) by mouth every 8 (eight) hours as needed for nausea or vomiting. 20 tablet 0  . potassium chloride SA (K-DUR,KLOR-CON) 20 MEQ tablet Take 1 tablet (20 mEq total) by mouth daily. 30 tablet 1  . pravastatin (PRAVACHOL) 40 MG tablet Take 1 tablet (40 mg total) by mouth daily at 6 PM. (Patient taking differently: Take 40 mg by mouth daily. ) 30 tablet 11  . tamsulosin (FLOMAX) 0.4 MG CAPS capsule Take 1 capsule (0.4 mg total) by mouth daily. 90 capsule 1  . traMADol (ULTRAM) 50 MG tablet Take 1 tablet (50 mg total) by mouth every 6 (six) hours as needed for moderate pain. 20 tablet 0  . warfarin (COUMADIN) 2 MG tablet Take 0.5 tablets (1 mg total) by mouth daily at 6 PM. Or as directed (Patient taking differently: Take 1 mg by mouth daily at 6 PM. 2 mg Monday- Friday. 1 mg all other days.) 30 tablet 1   No current facility-administered medications on file prior to visit.   No Known Allergies Social History   Social History  . Marital Status: Married    Spouse Name: N/A  . Number of Children: 4  . Years of Education: N/A   Occupational History  .     Social History Main Topics  . Smoking status: Former Research scientist (life sciences)  . Smokeless tobacco: Never Used     Comment: QUIT SMOKING  MANY YEARS AGO"  . Alcohol Use: No  . Drug Use: No  . Sexual Activity: Not on file   Other Topics Concern  . Not on file   Social History Narrative   ** Merged History Encounter **       Four adopted children.  Lives alone.      Review of Systems  All other  systems reviewed and are negative.      Objective:   Physical Exam  Neck: Neck supple. No JVD present.  Cardiovascular: Normal rate and regular rhythm.   Murmur heard. Pulmonary/Chest: Effort normal. He has no wheezes. He has no rales.  Abdominal: Soft. Bowel sounds are normal.  Musculoskeletal: He exhibits edema.  Lymphadenopathy:    He has no cervical adenopathy.  Neurological: He has normal reflexes.  No cranial nerve deficit. He exhibits normal muscle tone.  Vitals reviewed.         Assessment & Plan:  Weakness Orthostatic hypotension It appears that the majority of his symptoms were due to weakness and orthostatic hypotension. At the present time I will make no changes in his medication regimen. I would like to see the patient back in one month. I do believe that as he gets stronger and recover from his recent open heart surgery and hospitalizations that we will likely need to increase his dose of Lasix and resume his metoprolol. I certainly believe that it will be for the best to be on metoprolol at the present time. However, patient is too weak to tolerate the medication. I will recheck him in one month and likely resume it at that time. Should the patient develop a rapid heart rate we will resume an earlier. Should he retain fluid we will increase his dose of Lasix sooner. At the present time he denies any urinary urgency or hesitancy and we will continue to refrain from using Flomax

## 2015-12-27 ENCOUNTER — Encounter: Payer: Self-pay | Admitting: Family Medicine

## 2015-12-27 ENCOUNTER — Telehealth: Payer: Self-pay | Admitting: Pharmacist

## 2015-12-27 NOTE — Telephone Encounter (Signed)
Spoke to patient's wife about having INR checked. He is seeing Ignacia Bayley tomorrow so we will check him with that appt. From there will need to find out if [facility] will continue to come to home or if he needs to be monitored in office.

## 2015-12-28 ENCOUNTER — Encounter: Payer: Self-pay | Admitting: Nurse Practitioner

## 2015-12-28 ENCOUNTER — Ambulatory Visit (INDEPENDENT_AMBULATORY_CARE_PROVIDER_SITE_OTHER): Payer: Medicare Other | Admitting: Nurse Practitioner

## 2015-12-28 ENCOUNTER — Ambulatory Visit (INDEPENDENT_AMBULATORY_CARE_PROVIDER_SITE_OTHER): Payer: Medicare Other | Admitting: Pharmacist Clinician (PhC)/ Clinical Pharmacy Specialist

## 2015-12-28 VITALS — BP 132/80 | HR 77 | Ht 70.0 in | Wt 193.0 lb

## 2015-12-28 DIAGNOSIS — I480 Paroxysmal atrial fibrillation: Secondary | ICD-10-CM

## 2015-12-28 DIAGNOSIS — Z7901 Long term (current) use of anticoagulants: Secondary | ICD-10-CM | POA: Diagnosis not present

## 2015-12-28 DIAGNOSIS — Z9889 Other specified postprocedural states: Secondary | ICD-10-CM

## 2015-12-28 DIAGNOSIS — I2510 Atherosclerotic heart disease of native coronary artery without angina pectoris: Secondary | ICD-10-CM | POA: Diagnosis not present

## 2015-12-28 DIAGNOSIS — R110 Nausea: Secondary | ICD-10-CM

## 2015-12-28 DIAGNOSIS — I110 Hypertensive heart disease with heart failure: Secondary | ICD-10-CM | POA: Diagnosis not present

## 2015-12-28 DIAGNOSIS — I951 Orthostatic hypotension: Secondary | ICD-10-CM | POA: Diagnosis not present

## 2015-12-28 DIAGNOSIS — I119 Hypertensive heart disease without heart failure: Secondary | ICD-10-CM | POA: Insufficient documentation

## 2015-12-28 LAB — POCT INR: INR: 1.4

## 2015-12-28 MED ORDER — METOPROLOL TARTRATE 25 MG PO TABS: 12.5000 mg | ORAL_TABLET | Freq: Two times a day (BID) | ORAL | Status: DC

## 2015-12-28 NOTE — Patient Instructions (Signed)
Fred Bayley, NP, has recommended making the following medication changes: 1. STOP Amiodarone 2. RESTART Metoprolol Tartrate 12.5 mg - take 0.5 tablet (12.5 mg total) by mouth twice daily  Gerald Stabs recommends that you schedule a follow-up appointment in 1 month with Dr Percival Spanish.  If you need a refill on your cardiac medications before your next appointment, please call your pharmacy.

## 2015-12-28 NOTE — Progress Notes (Signed)
Office Visit    Patient Name: [name] Date of Encounter: 12/28/2015  Primary Care Provider:  [name], MD Primary Cardiologist:  Lenna Sciara. Hochrein, MD   Chief Complaint    82 y/o ? with a h/o CAD and severe MR s/p MVR in 10/2015, who presents for f/u after recent hospitalization r/t orthostasis.  Past Medical History    Past Medical History  Diagnosis Date  . Coronary artery disease     a. 11/2014 NSTEMI: DESx 2 placed to LAD and RCA; b. 08/2015 Cath: LAD patent stent, LCX 9m, RCA patent stent, 59m.  . Diabetes mellitus, type II ([place])     a. HgA1c 6.8 in 03/2015  . SVT (supraventricular tachycardia) ([facility])     a. recurrent, usually responsive to vagal manuevers  . Pulmonary HTN ([place])   . Lower extremity edema     a. chronic  . HLD (hyperlipidemia)   . Hypertensive heart disease   . OA (osteoarthritis)   . ED (erectile dysfunction)   . Chronic fatigue   . Gout   . Spinal stenosis   . Prostate cancer ([place])   . Severe mitral regurgitation     a.  10/2015 s/p minimally invasive MV repair; b. 10/2015 Echo: mod MS, mean grad 51mmHg.  Marland Kitchen Chronic diastolic (congestive) heart failure ([place])     a. 10/2013 Echo: EF 55-60%, basal-mid anteroseptal and basal-mid inferoseptal HK.  Marland Kitchen Hemangioma of liver 08/02/2015    12 mm enhancing lesion seen on CT - suspicious for benign hemangioma but not diagnostic - MRI recommended  . Kidney stones   . Physical deconditioning   . PAF (paroxysmal atrial fibrillation) ([place])     a. 10/2015 s/p DCCV;  b. CHA2DS2VASc = 6--> on coumadin; c. 10/2015 Echo: sev dil LA.  . Orthostatic hypotension   . Tricuspid regurgitation     a. 10/2015 Echo: mild to mod TR.  Marland Kitchen Pulmonary hypertension ([place])     a. 10/2015 Echo: PASP 71mmHg.  Marland Kitchen Chronic subdural hematoma ([place])     a. 12/2015 Head CT: chronic right holo-hemispheric SDH w/o midline shift.   Past Surgical History  Procedure Laterality Date  . Cardiac surgery      2 cardiac stents  . Joint  replacement      shoulder  . Back surgery x 2    . Rt rotator cuff repair    . Cervical spine surgery    . Back surgery    . Left heart catheterization with coronary angiogram N/A 11/16/2014    Procedure: LEFT HEART CATHETERIZATION WITH CORONARY ANGIOGRAM;  Surgeon: Troy Sine, MD;  Location: [facility] CATH LAB;  Service: Cardiovascular;  Laterality: N/A;  . Tee without cardioversion N/A 06/22/2015    Procedure: TRANSESOPHAGEAL ECHOCARDIOGRAM (TEE);  Surgeon: Skeet Latch, MD;  Location: [place];  Service: Cardiovascular;  Laterality: N/A;  . Cardiac catheterization N/A 08/08/2015    Procedure: Right/Left Heart Cath and Coronary Angiography;  Surgeon: Sherren Mocha, MD;  Location: [place] CV LAB;  Service: Cardiovascular;  Laterality: N/A;  . Eye surgery    . Mitral valve repair Right 10/04/2015    Procedure: MINIMALLY INVASIVE MITRAL VALVE REPAIR (MVR);  Surgeon: Rexene Alberts, MD;  Location: [place];  Service: Open Heart Surgery;  Laterality: Right;  . Tee without cardioversion N/A 10/04/2015    Procedure: TRANSESOPHAGEAL ECHOCARDIOGRAM (TEE);  Surgeon: Rexene Alberts, MD;  Location: [place];  Service: Open Heart Surgery;  Laterality: N/A;  . Wound  exploration N/A 10/05/2015    Procedure: Sternal Incision;  Surgeon: Rexene Alberts, MD;  Location: [place];  Service: Open Heart Surgery;  Laterality: N/A;  . Intraoperative transesophageal echocardiogram  10/05/2015    Procedure: INTRAOPERATIVE TRANSESOPHAGEAL ECHOCARDIOGRAM;  Surgeon: Rexene Alberts, MD;  Location: [facility] OR;  Service: Open Heart Surgery;;  . Cardioversion N/A 10/20/2015    Procedure: CARDIOVERSION;  Surgeon: Pixie Casino, MD;  Location: [facility] ENDOSCOPY;  Service: Cardiovascular;  Laterality: N/A;    Allergies  No Known Allergies  History of Present Illness    82 y/o ? with the above complex past medical history including CAD s/p LAD and RCA stenting in 11/2014 (patent on cath 08/2015), HTN, HL, chronic subdural hematoma, and  severe MR s/p MV repair in 10/2015.  Post-op course complicated by Afib and readmission r/t diastolic failure/volume overload in late March.  He underwent DCCV @ that time and was maintained on amio.  He was readmitted in early May with weakness and syncope, felt to be secondary to profound orthostasis.  He was hydrated and meds adjusted.  His amio dose was reduced to 100 mg daily as he was noted to drop HR into 40's during presyncopal/syncopal spells.  Since his d/c, he has had steady but slow improvement in his strength w/o further significant orthostasis.  He says that if he gets up too fast, he may feel lightheaded, but he has learned to get up more slowly.  His activity remains pretty limited and he continues to use a walker when up.    His biggest concern today is that ever since his last hospitalization, he has been having nausea with poor appetite, and occasional dry heaving.  He has not vomited.  Even the smells of foods put him off.  As a result, his intake has been poor, and he feels that it is hindering his rehabilitation.  He did not mention these Ss when he recently saw his PCP.  He has not been having c/p, dyspnea, pnd, or orthopnea.  He has noted mild ankle edema, but this has been getting better on low dose lasix.  Home Medications    Prior to Admission medications   Medication Sig Start Date End Date Taking? Authorizing Provider  amiodarone (PACERONE) 100 MG tablet Take 1 tablet (100 mg total) by mouth daily. 12/11/15  Yes Brett Canales, PA-C  aspirin EC 81 MG tablet Take 81 mg by mouth daily.   Yes Historical Provider, MD  colchicine 0.6 MG tablet Take 1 tablet (0.6 mg total) by mouth daily. 10/31/15  Yes [name], PA  finasteride (PROSCAR) 5 MG tablet TAKE ONE (1) TABLET EACH DAY 09/28/15  Yes [name], MD  fluticasone [facility]) 50 MCG/ACT nasal spray Place 1 spray into both nostrils daily as needed for allergies or rhinitis.   Yes Historical Provider, MD  furosemide  (LASIX) 40 MG tablet Take 20 mg by mouth 2 (two) times daily.    Yes Historical Provider, MD  ondansetron (ZOFRAN) 4 MG tablet Take 1 tablet (4 mg total) by mouth every 8 (eight) hours as needed for nausea or vomiting. 12/21/15  Yes [name], MD  potassium chloride SA (K-DUR,KLOR-CON) 20 MEQ tablet Take 1 tablet (20 mEq total) by mouth daily. 10/31/15  Yes [name], PA  pravastatin (PRAVACHOL) 40 MG tablet Take 1 tablet (40 mg total) by mouth daily at 6 PM. Patient taking differently: Take 40 mg by mouth daily.  06/05/15  Yes Woodfin Ganja  Grandville Silos, PA-C  traMADol (ULTRAM) 50 MG tablet Take 1 tablet (50 mg total) by mouth every 6 (six) hours as needed for moderate pain. 10/13/15  Yes Donielle Liston Alba, PA-C  warfarin (COUMADIN) 2 MG tablet Take 0.5 tablets (1 mg total) by mouth daily at 6 PM. Or as directed Patient taking differently: Take 1 mg by mouth daily at 6 PM. 2 mg Monday- Friday. 1 mg all other days. 10/31/15  Yes [name], PA  metoprolol tartrate (LOPRESSOR) 25 MG tablet Take 0.5 tablets (12.5 mg total) by mouth 2 (two) times daily. 12/28/15   Rogelia Mire, NP  tamsulosin (FLOMAX) 0.4 MG CAPS capsule Take 1 capsule (0.4 mg total) by mouth daily. Patient not taking: Reported on 12/28/2015 09/08/15   [name], MD    Review of Systems    As above, he has been having mild LEE, which is improving.  No significant orthostasis.  Persistent nausea since prior to his most recent hospitalization.  He denies chest pain, palpitations, dyspnea, pnd, orthopnea, syncope, weight gain.  All other systems reviewed and are otherwise negative except as noted above.  Physical Exam    VS:  BP 132/80 mmHg  Pulse 77  Ht 5\' 10"  (1.778 m)  Wt 193 lb (87.544 kg)  BMI 27.69 kg/m2 , BMI Body mass index is 27.69 kg/(m^2). GEN: Well nourished, well developed, in no acute distress. HEENT: normal. Neck: Supple, no JVD, carotid bruits, or masses. Cardiac: RRR, + S4, soft syst  murmur @ apex, no rubs, or gallops. No clubbing, cyanosis, trace bi-malleolar edema.  Radials/DP/PT 2+ and equal bilaterally.  Respiratory:  Respirations regular and unlabored, diminished breath sounds @ right base, otw clear to auscultation bilaterally. GI: Soft, nontender, nondistended, BS + x 4. MS: no deformity or atrophy. Skin: warm and dry, no rash. Neuro:  Strength and sensation are intact. Psych: Normal affect.  Accessory Clinical Findings    RSR, 77, LAD, LAFB, LAE, prior inf infarct, prolonged qt.  Assessment & Plan    1.  CAD:  S/p prior pci/des to the LAD and RCA in 11/2014.  Patent stents on cath in 08/2015.  No recent c/p.  Cont low-dose asa and statin.   blocker prev d/c'd in setting of orthostasis and fatigue however he has been experiencing nausea and food avoidance, which I am concerned may be related to ongoing amiodarone therapy.  As such, I am d/c'ing amio and will resume lopressor 12.5 bid.  2.  Nausea:  Pt has been experiencing n/food avoidance for over three weeks now.  I am concerned that this may be coming from his amio.  D/c amio.  3.  PAF:  S/p DCCV in 10/2015.  Plan @ that time was to limit amio therapy to 1 month per d/c summary in late March.  Dose was reduced to 100 mg daily earlier this month b/c of intermittent bradycardia.  As above, he has been having persistent nausea, which may be attributable to [facility] therapy.  I will stop amio altogether and resume low dose  blocker.  INR to be checked today.  4.  Orthostatic Hypotension:  Much improved - pt more aware and getting up more slowly.  He knows to monitor Ss with resumption of low-dose  blocker.  Hopefully, with d/c of amio, appetite and PO intake will improve.  5.  S/P MVR:  MV stable per echo in March.  6.  Hypertensive heart dzs:  Stable.  7.  HL:  Cont statin.  8.  Chronic diastolic chf:  Relatively euvolemic today.  HR/BP stable.  cont low dose lasix.  9.  Dispo:  F/u with Dr. Percival Spanish or APP in 1  month or sooner if necessary.  Murray Hodgkins, NP 12/28/2015, 1:56 PM

## 2015-12-29 ENCOUNTER — Emergency Department (HOSPITAL_COMMUNITY): Payer: Medicare Other

## 2015-12-29 ENCOUNTER — Telehealth: Payer: Self-pay | Admitting: Family Medicine

## 2015-12-29 ENCOUNTER — Emergency Department (HOSPITAL_COMMUNITY)
Admission: EM | Admit: 2015-12-29 | Discharge: 2015-12-29 | Disposition: A | Discharge status: Home / Self Care | Payer: Medicare Other | Attending: Emergency Medicine | Admitting: Emergency Medicine

## 2015-12-29 ENCOUNTER — Other Ambulatory Visit: Payer: Self-pay | Admitting: *Deleted

## 2015-12-29 ENCOUNTER — Encounter (HOSPITAL_COMMUNITY): Payer: Self-pay | Admitting: Emergency Medicine

## 2015-12-29 DIAGNOSIS — E119 Type 2 diabetes mellitus without complications: Secondary | ICD-10-CM | POA: Diagnosis not present

## 2015-12-29 DIAGNOSIS — I110 Hypertensive heart disease with heart failure: Secondary | ICD-10-CM | POA: Diagnosis not present

## 2015-12-29 DIAGNOSIS — Z9889 Other specified postprocedural states: Secondary | ICD-10-CM | POA: Diagnosis not present

## 2015-12-29 DIAGNOSIS — Z79899 Other long term (current) drug therapy: Secondary | ICD-10-CM | POA: Diagnosis not present

## 2015-12-29 DIAGNOSIS — R627 Adult failure to thrive: Secondary | ICD-10-CM | POA: Diagnosis not present

## 2015-12-29 DIAGNOSIS — M549 Dorsalgia, unspecified: Secondary | ICD-10-CM | POA: Insufficient documentation

## 2015-12-29 DIAGNOSIS — I6200 Nontraumatic subdural hemorrhage, unspecified: Secondary | ICD-10-CM | POA: Diagnosis not present

## 2015-12-29 DIAGNOSIS — Z86018 Personal history of other benign neoplasm: Secondary | ICD-10-CM | POA: Diagnosis not present

## 2015-12-29 DIAGNOSIS — Z7982 Long term (current) use of aspirin: Secondary | ICD-10-CM | POA: Diagnosis not present

## 2015-12-29 DIAGNOSIS — I5032 Chronic diastolic (congestive) heart failure: Secondary | ICD-10-CM | POA: Insufficient documentation

## 2015-12-29 DIAGNOSIS — Z87438 Personal history of other diseases of male genital organs: Secondary | ICD-10-CM | POA: Diagnosis not present

## 2015-12-29 DIAGNOSIS — Z87891 Personal history of nicotine dependence: Secondary | ICD-10-CM | POA: Diagnosis not present

## 2015-12-29 DIAGNOSIS — M1990 Unspecified osteoarthritis, unspecified site: Secondary | ICD-10-CM | POA: Insufficient documentation

## 2015-12-29 DIAGNOSIS — Z7901 Long term (current) use of anticoagulants: Secondary | ICD-10-CM | POA: Diagnosis not present

## 2015-12-29 DIAGNOSIS — E785 Hyperlipidemia, unspecified: Secondary | ICD-10-CM | POA: Insufficient documentation

## 2015-12-29 DIAGNOSIS — I2510 Atherosclerotic heart disease of native coronary artery without angina pectoris: Secondary | ICD-10-CM | POA: Diagnosis not present

## 2015-12-29 DIAGNOSIS — Z7951 Long term (current) use of inhaled steroids: Secondary | ICD-10-CM | POA: Insufficient documentation

## 2015-12-29 DIAGNOSIS — I480 Paroxysmal atrial fibrillation: Secondary | ICD-10-CM | POA: Diagnosis not present

## 2015-12-29 DIAGNOSIS — Z87442 Personal history of urinary calculi: Secondary | ICD-10-CM | POA: Diagnosis not present

## 2015-12-29 DIAGNOSIS — R110 Nausea: Secondary | ICD-10-CM

## 2015-12-29 DIAGNOSIS — Z8546 Personal history of malignant neoplasm of prostate: Secondary | ICD-10-CM | POA: Insufficient documentation

## 2015-12-29 DIAGNOSIS — R112 Nausea with vomiting, unspecified: Secondary | ICD-10-CM | POA: Insufficient documentation

## 2015-12-29 LAB — COMPREHENSIVE METABOLIC PANEL
ALT: 32 U/L (ref 17–63)
AST: 62 U/L — ABNORMAL HIGH (ref 15–41)
Albumin: 3.1 g/dL — ABNORMAL LOW (ref 3.5–5.0)
Alkaline Phosphatase: 133 U/L — ABNORMAL HIGH (ref 38–126)
Anion gap: 10 (ref 5–15)
BUN: 10 mg/dL (ref 6–20)
CHLORIDE: 94 mmol/L — AB (ref 101–111)
CO2: 31 mmol/L (ref 22–32)
Calcium: 9.1 mg/dL (ref 8.9–10.3)
Creatinine, Ser: 1.26 mg/dL — ABNORMAL HIGH (ref 0.61–1.24)
GFR, EST AFRICAN AMERICAN: 59 mL/min — AB (ref >60)
GFR, EST NON AFRICAN AMERICAN: 51 mL/min — AB (ref >60)
Glucose, Bld: 111 mg/dL — ABNORMAL HIGH (ref 65–99)
POTASSIUM: 3.5 mmol/L (ref 3.5–5.1)
SODIUM: 135 mmol/L (ref 135–145)
Total Bilirubin: 1.1 mg/dL (ref 0.3–1.2)
Total Protein: 6.7 g/dL (ref 6.5–8.1)

## 2015-12-29 LAB — URINE MICROSCOPIC-ADD ON

## 2015-12-29 LAB — URINALYSIS, ROUTINE W REFLEX MICROSCOPIC
Bilirubin Urine: NEGATIVE
GLUCOSE, UA: NEGATIVE mg/dL
Ketones, ur: NEGATIVE mg/dL
LEUKOCYTES UA: NEGATIVE
Nitrite: NEGATIVE
PH: 6.5 (ref 5.0–8.0)
Protein, ur: NEGATIVE mg/dL
Specific Gravity, Urine: 1.014 (ref 1.005–1.030)

## 2015-12-29 LAB — CBC
HEMATOCRIT: 43.5 % (ref 39.0–52.0)
Hemoglobin: 13.7 g/dL (ref 13.0–17.0)
MCH: 27.6 pg (ref 26.0–34.0)
MCHC: 31.5 g/dL (ref 30.0–36.0)
MCV: 87.7 fL (ref 78.0–100.0)
Platelets: 173 10*3/uL (ref 150–400)
RBC: 4.96 MIL/uL (ref 4.22–5.81)
RDW: 15.6 % — ABNORMAL HIGH (ref 11.5–15.5)
WBC: 6 10*3/uL (ref 4.0–10.5)

## 2015-12-29 LAB — I-STAT CG4 LACTIC ACID, ED: Lactic Acid, Venous: 1.72 mmol/L (ref 0.5–2.0)

## 2015-12-29 MED ORDER — ONDANSETRON HCL 4 MG/2ML IJ SOLN
4.0000 mg | Freq: Once | INTRAMUSCULAR | Status: AC
  Administered 2015-12-29: 4 mg via INTRAVENOUS
  Filled 2015-12-29: qty 2

## 2015-12-29 MED ORDER — SODIUM CHLORIDE 0.9 % IV BOLUS (SEPSIS)
1000.0000 mL | Freq: Once | INTRAVENOUS | Status: AC
  Administered 2015-12-29: 1000 mL via INTRAVENOUS

## 2015-12-29 NOTE — Telephone Encounter (Signed)
Spoke to wife and strongly encouraged her to have him taken to ED via EMS.  Needs IVF and eval for bowel obstruction.

## 2015-12-29 NOTE — Discharge Instructions (Signed)
Please stop taking your Colchicine and let your doctor know that you have stopped it because I thought it might be causing your stomach issues.

## 2015-12-29 NOTE — Telephone Encounter (Signed)
Sounds dehydrated.  If unable to take PO, should go to ER for possible IV and rule out bowel obstruction.

## 2015-12-29 NOTE — Telephone Encounter (Signed)
[facility] nurse to home to assess.  Vitals were 110/70 83 20  O2 97% RA.  Very weak.  Can not take barely anything by mouth.  Weigh 193.2 2 days ago, today 187.4.  Is taking zofran with no results  Please advise?

## 2015-12-29 NOTE — ED Notes (Signed)
No changes, to CT °

## 2015-12-29 NOTE — ED Notes (Signed)
PT unable to provide UA at this time, PT stated that he void this morning and that's it.

## 2015-12-29 NOTE — ED Notes (Signed)
Pt reports that he has been unable to eat or drink anything for 2 days. Pt reports that when he eats, he gets nauseated and "heaves." but does not actually vomit. Pt sts this has been getting progressively worse send his stent placement year ago. Pt sts the only oral intake he has had in the past 2 days is a little water and buttermilk.

## 2015-12-29 NOTE — ED Provider Notes (Signed)
CSN: [id number]     Arrival date & time 12/29/15  1721 History   First MD Initiated Contact with Patient 12/29/15 1853     Chief Complaint  Patient presents with  . Nausea  . Failure To Thrive     (Consider location/radiation/quality/duration/timing/severity/associated sxs/prior Treatment) Patient is a 82 y.o. male presenting with general illness.  Illness Location:  Nausea, dry heaving Quality:  Nausea and dry heaving Severity:  Mild Onset quality:  Gradual Duration:  3 weeks Timing:  Constant Progression:  Worsening Chronicity:  New Context:  When smelling or eating food Relieved by:  None Worsened by:  None Ineffective treatments:  Zofran Associated symptoms: nausea and vomiting   Associated symptoms: no abdominal pain, no congestion, no cough, no fever and no rash     Past Medical History  Diagnosis Date  . Coronary artery disease     a. 11/2014 NSTEMI: DESx 2 placed to LAD and RCA; b. 08/2015 Cath: LAD patent stent, LCX 61m, RCA patent stent, 33m.  . Diabetes mellitus, type II ([place])     a. HgA1c 6.8 in 03/2015  . SVT (supraventricular tachycardia) (HCC)     a. recurrent, usually responsive to vagal manuevers  . Pulmonary HTN ([place])   . Lower extremity edema     a. chronic  . HLD (hyperlipidemia)   . Hypertensive heart disease   . OA (osteoarthritis)   . ED (erectile dysfunction)   . Chronic fatigue   . Gout   . Spinal stenosis   . Prostate cancer ([place])   . Severe mitral regurgitation     a.  10/2015 s/p minimally invasive MV repair; b. 10/2015 Echo: mod MS, mean grad 11mmHg.  Marland Kitchen Chronic diastolic (congestive) heart failure ([place])     a. 10/2013 Echo: EF 55-60%, basal-mid anteroseptal and basal-mid inferoseptal HK.  Marland Kitchen Hemangioma of liver 08/02/2015    12 mm enhancing lesion seen on CT - suspicious for benign hemangioma but not diagnostic - MRI recommended  . Kidney stones   . Physical deconditioning   . PAF (paroxysmal atrial fibrillation) ([place])     a. 10/2015 s/p  DCCV;  b. CHA2DS2VASc = 6--> on coumadin; c. 10/2015 Echo: sev dil LA.  . Orthostatic hypotension   . Tricuspid regurgitation     a. 10/2015 Echo: mild to mod TR.  Marland Kitchen Pulmonary hypertension ([place])     a. 10/2015 Echo: PASP 72mmHg.  Marland Kitchen Chronic subdural hematoma ([place])     a. 12/2015 Head CT: chronic right holo-hemispheric SDH w/o midline shift.   Past Surgical History  Procedure Laterality Date  . Cardiac surgery      2 cardiac stents  . Joint replacement      shoulder  . Back surgery x 2    . Rt rotator cuff repair    . Cervical spine surgery    . Back surgery    . Left heart catheterization with coronary angiogram N/A 11/16/2014    Procedure: LEFT HEART CATHETERIZATION WITH CORONARY ANGIOGRAM;  Surgeon: Troy Sine, MD;  Location[facility] C[facility] LAB;  Service: Cardiovascular;  Laterality: N/A;  . Tee without cardioversion N/A 06/22/2015    Procedure: TRANSESOPHAGEAL ECHOCARDIOGRAM (TEE);  Surgeon: Skeet Latch, MD;  Location: [place];  Service: Cardiovascular;  Laterality: N/A;  . Cardiac catheterization N/A 08/08/2015    Procedure: Right/Left Heart Cath and Coronary Angiography;  Surgeon: Sherren Mocha, MD;  Location: [facility] CV LAB;  Service: Cardiovascular;  Laterality: N/A;  . Eye surgery    .  Mitral valve repair Right 10/04/2015    Procedure: MINIMALLY INVASIVE MITRAL VALVE REPAIR (MVR);  Surgeon: Rexene Alberts, MD;  Location: [place];  Service: Open Heart Surgery;  Laterality: Right;  . Tee without cardioversion N/A 10/04/2015    Procedure: TRANSESOPHAGEAL ECHOCARDIOGRAM (TEE);  Surgeon: Rexene Alberts, MD;  Location: [place];  Service: Open Heart Surgery;  Laterality: N/A;  . Wound exploration N/A 10/05/2015    Procedure: Sternal Incision;  Surgeon: Rexene Alberts, MD;  Location: [place];  Service: Open Heart Surgery;  Laterality: N/A;  . Intraoperative transesophageal echocardiogram  10/05/2015    Procedure: INTRAOPERATIVE TRANSESOPHAGEAL ECHOCARDIOGRAM;  Surgeon: Rexene Alberts, MD;   Location: [place];  Service: Open Heart Surgery;;  . Cardioversion N/A 10/20/2015    Procedure: CARDIOVERSION;  Surgeon: Pixie Casino, MD;  Location: [facility] ENDOSCOPY;  Service: Cardiovascular;  Laterality: N/A;   Family History  Problem Relation Age of Onset  . Heart failure Mother 54  . Heart attack Mother   . Heart attack Brother    Social History  Substance Use Topics  . Smoking status: Former Research scientist (life sciences)  . Smokeless tobacco: Never Used     Comment: QUIT SMOKING  MANY YEARS AGO"  . Alcohol Use: No    Review of Systems  Constitutional: Negative for fever and chills.  HENT: Negative for congestion.   Eyes: Negative for pain.  Respiratory: Negative for cough.   Gastrointestinal: Positive for nausea and vomiting. Negative for abdominal pain.  Musculoskeletal: Positive for back pain.  Skin: Negative for rash.  All other systems reviewed and are negative.     Allergies  Review of patient's allergies indicates no known allergies.  Home Medications   Prior to Admission medications   Medication Sig Start Date End Date Taking? Authorizing Provider  aspirin EC 81 MG tablet Take 81 mg by mouth daily. Reported on 12/29/2015   Yes Historical Provider, MD  finasteride (PROSCAR) 5 MG tablet TAKE ONE (1) TABLET EACH DAY 09/28/15  Yes [name], MD  fluticasone [facility]) 50 MCG/ACT nasal spray Place 1 spray into both nostrils daily as needed for allergies or rhinitis.   Yes Historical Provider, MD  furosemide (LASIX) 40 MG tablet Take 20 mg by mouth 2 (two) times daily.    Yes Historical Provider, MD  metoprolol tartrate (LOPRESSOR) 25 MG tablet Take 0.5 tablets (12.5 mg total) by mouth 2 (two) times daily. 12/28/15  Yes Rogelia Mire, NP  ondansetron (ZOFRAN) 4 MG tablet Take 1 tablet (4 mg total) by mouth every 8 (eight) hours as needed for nausea or vomiting. 12/21/15  Yes [name], MD  potassium chloride SA (K-DUR,KLOR-CON) 20 MEQ tablet Take 1 tablet (20 mEq total) by  mouth daily. 10/31/15  Yes [name], PA  pravastatin (PRAVACHOL) 40 MG tablet Take 1 tablet (40 mg total) by mouth daily at 6 PM. 06/05/15  Yes Eileen Stanford, PA-C  tamsulosin (FLOMAX) 0.4 MG CAPS capsule Take 1 capsule (0.4 mg total) by mouth daily. 09/08/15  Yes [name], MD  warfarin (COUMADIN) 2 MG tablet Take 0.5 tablets (1 mg total) by mouth daily at 6 PM. Or as directed Patient taking differently: Take 1 mg by mouth daily at 6 PM. 2 mg Monday- Friday. 1 mg all other days. 10/31/15  Yes [name], PA  traMADol (ULTRAM) 50 MG tablet Take 1 tablet (50 mg total) by mouth every 6 (six) hours as needed for moderate pain. 10/13/15  Donielle Liston Alba, PA-C   BP 140/84 mmHg  Pulse 69  Temp(Src) 97.4 F (36.3 C) (Oral)  Resp 18  SpO2 94% Physical Exam  Constitutional: He is oriented to person, place, and time. He appears well-developed and well-nourished.  HENT:  Head: Normocephalic and atraumatic.  Mouth/Throat: Mucous membranes are dry.  Neck: Normal range of motion.  Cardiovascular: Normal rate.   Pulmonary/Chest: Effort normal. No respiratory distress.  Abdominal: Soft. He exhibits no distension. There is no tenderness.  Musculoskeletal: Normal range of motion. He exhibits no edema or tenderness.  Neurological: He is alert and oriented to person, place, and time. No cranial nerve deficit.  Skin: Skin is warm and dry.  Nursing note and vitals reviewed.   ED Course  Procedures (including critical care time) Labs Review Labs Reviewed  COMPREHENSIVE METABOLIC PANEL - Abnormal; Notable for the following:    Chloride 94 (*)    Glucose, Bld 111 (*)    Creatinine, Ser 1.26 (*)    Albumin 3.1 (*)    AST 62 (*)    Alkaline Phosphatase 133 (*)    GFR calc non Af Amer 51 (*)    GFR calc Af Amer 59 (*)    All other components within normal limits  CBC - Abnormal; Notable for the following:    RDW 15.6 (*)    All other components within normal limits   URINALYSIS, ROUTINE W REFLEX MICROSCOPIC (NOT AT [facility]) - Abnormal; Notable for the following:    Hgb urine dipstick SMALL (*)    All other components within normal limits  URINE MICROSCOPIC-ADD ON - Abnormal; Notable for the following:    Squamous Epithelial / LPF 0-5 (*)    Bacteria, UA RARE (*)    Casts HYALINE CASTS (*)    All other components within normal limits  I-STAT CG4 LACTIC ACID, ED    Imaging Review Ct Head Wo Contrast  12/29/2015  CLINICAL DATA:  Chronic subdural hematoma follow-up.  Nausea. EXAM: CT HEAD WITHOUT CONTRAST TECHNIQUE: Contiguous axial images were obtained from the base of the skull through the vertex without intravenous contrast. COMPARISON:  Most recent head CT 12/08/2015, head CT 10/18/2015 also reviewed FINDINGS: Chronic right subdural whole hemispheric fluid collection measuring 1.4 cm, isodense to CSF unchanged from prior exam. No acute hemorrhage component. There is mass effect on the subjacent parenchyma, unchanged. No associated midline shift. The exam is otherwise unchanged. Normal for age atrophy is stable. Moderate chronic small vessel ischemia. Basal gangliar calcifications. Right cerebellar calcifications are unchanged. No acute intracranial hemorrhage or evidence of territorial ischemia. The calvarium is intact. The mastoid air cells are well aerated. Chronic opacification of a posterior left ethmoid air cell. Bilateral mucous retention cysts are unchanged. IMPRESSION: 1.  No acute intracranial abnormality. 2. Unchanged chronic right subdural hematoma/hygroma without acute hemorrhage component. Stable chronic small vessel ischemic change. Electronically Signed   By: Jeb Levering M.D.   On: 12/29/2015 22:55   Dg Abd Acute W/chest  12/29/2015  CLINICAL DATA:  Acute onset of nausea and dry heaves. Unable to eat or drink. Initial encounter. EXAM: DG ABDOMEN ACUTE W/ 1V CHEST COMPARISON:  Chest radiograph performed 12/08/2015, and CTA of the chest, abdomen  and pelvis performed 08/02/2015 FINDINGS: The lungs are well-aerated. A small right pleural effusion is noted. Mild bibasilar atelectasis is noted. There is no evidence of pneumothorax. The cardiomediastinal silhouette is mildly enlarged. There is borderline prominence of a small bowel loop, measuring 3.2 cm in diameter,  without evidence for bowel obstruction. Remaining visualized bowel loops are grossly unremarkable in appearance. No free intra-abdominal air is identified on the provided upright view. No acute osseous abnormalities are seen; the sacroiliac joints are unremarkable in appearance. Prominent lateral osteophytes are noted along the lumbar spine. Cervical spinal fusion hardware is partially imaged. IMPRESSION: 1. No evidence of bowel obstruction. Slight prominence of a single small bowel loop may reflect mild dysmotility, or may remain within normal limits. 2. Small right pleural effusion noted. Mild bibasilar atelectasis noted. 3. Mild cardiomegaly. Electronically Signed   By: Garald Balding M.D.   On: 12/29/2015 19:59   I have personally reviewed and evaluated these images and lab results as part of my medical decision-making.   EKG Interpretation   Date/Time:  Friday Dec 29 2015 20:30:26 EDT Ventricular Rate:  68 PR Interval:  215 QRS Duration: 98 QT Interval:  475 QTC Calculation: 505 R Axis:   -52 Text Interpretation:  Sinus rhythm Borderline prolonged PR interval  Probable left atrial enlargement Inferior infarct, old Anterior infarct,  old Prolonged QT interval improved QT interval Confirmed by [facility] MD,  Corene Cornea [phone]) on 12/29/2015 8:48:51 PM      MDM   Final diagnoses:  Nausea    i suspect his weight loss is secondary to decreased PO intake and dehydration. I think his nausea/GI upset is from colchicine as he started that after coming home from hospital and had never had it or the symptoms before. His workup here is unremarkable. I have d/w family who will take him  home and continue seeing PCP to ensure this is the right answer. i also asked case management to help set up home health (PT/RN/Aide) to help his wife and help patient try to get back closer to normal.   New Prescriptions: Discharge Medication List as of 12/29/2015 11:20 PM      I have personally and contemperaneously reviewed labs and imaging and used in my decision making as above.   A medical screening exam was performed and I feel the patient has had an appropriate workup for their chief complaint at this time and likelihood of emergent condition existing is low and thus workup can continue on an outpatient basis.. Their vital signs are stable. They have been counseled on decision, discharge, follow up and which symptoms necessitate immediate return to the emergency department.  They verbally stated understanding and agreement with plan and discharged in stable condition.      Merrily Pew, MD 12/30/15 [phone]

## 2015-12-29 NOTE — ED Notes (Signed)
Case management speaking with EDP and pt via phone.

## 2015-12-29 NOTE — ED Notes (Signed)
Dr. Dayna Barker into room, at [facility]

## 2015-12-31 NOTE — Patient Outreach (Signed)
[facility] [place] [facility]) Care Management   Home visit 12/29/15  [name] 1928/11/28 [id number]  [name] is an 82 y.o. male  Subjective:  Pt reports being nauseous, having dry heaves, been going on for a week.  Pt reports peaches did not stay down, so far buttermilk today has.  Spouse reports something needs to be done, was 193.2 lbs two days ago, today 187.4 lbs (per pt).  Spouse reports pt is taking Zofran twice a day, does not help.  Pt reports saw Dr. Dennard Schaumann twice about his stomach, several medications stopped to see if it helps- no improvement seen.  Pt reports he wakes up sob.     Objective:   Filed Vitals:   12/29/15 1109  BP: 110/70  Pulse: 83  Resp: 20    ROS  Physical Exam  Constitutional: He is oriented to person, place, and time. He appears well-developed.  Cardiovascular: Normal rate, regular rhythm and normal heart sounds.   Respiratory: Effort normal and breath sounds normal.  GI: Soft.  Musculoskeletal: Normal range of motion. He exhibits edema.  Trace edema to bilateral ankles  Neurological: He is alert and oriented to person, place, and time.  Skin: Skin is warm and dry.  Psychiatric: His behavior is normal. Judgment and thought content normal.  Pt discouraged over being nauseous, dry heaves for past week.     Encounter Medications:  Reviewed with spouse.  Outpatient Encounter Prescriptions as of 12/29/2015  Medication Sig Note  . finasteride (PROSCAR) 5 MG tablet TAKE ONE (1) TABLET EACH DAY   . fluticasone (FLONASE) 50 MCG/ACT nasal spray Place 1 spray into both nostrils daily as needed for allergies or rhinitis.   . furosemide (LASIX) 40 MG tablet Take 20 mg by mouth 2 (two) times daily.    . ondansetron (ZOFRAN) 4 MG tablet Take 1 tablet (4 mg total) by mouth every 8 (eight) hours as needed for nausea or vomiting.   . potassium chloride SA (K-DUR,KLOR-CON) 20 MEQ tablet Take 1 tablet (20 mEq total) by mouth daily.   . traMADol (ULTRAM)  50 MG tablet Take 1 tablet (50 mg total) by mouth every 6 (six) hours as needed for moderate pain.   Marland Kitchen warfarin (COUMADIN) 2 MG tablet Take 0.5 tablets (1 mg total) by mouth daily at 6 PM. Or as directed (Patient taking differently: Take 1 mg by mouth daily at 6 PM. 2 mg Monday- Friday. 1 mg all other days.) 12/29/2015: Pt taking 1mg  M-W-F, 2 mg Sunday, Tues, Thurs, Saturday.   . [DISCONTINUED] colchicine 0.6 MG tablet Take 1 tablet (0.6 mg total) by mouth daily.   Marland Kitchen aspirin EC 81 MG tablet Take 81 mg by mouth daily. Reported on 12/29/2015   . metoprolol tartrate (LOPRESSOR) 25 MG tablet Take 0.5 tablets (12.5 mg total) by mouth 2 (two) times daily.   . pravastatin (PRAVACHOL) 40 MG tablet Take 1 tablet (40 mg total) by mouth daily at 6 PM.   . tamsulosin (FLOMAX) 0.4 MG CAPS capsule Take 1 capsule (0.4 mg total) by mouth daily.    No facility-administered encounter medications on file as of 12/29/2015.    Functional Status:   In your present state of health, do you have any difficulty performing the following activities: 12/10/2015 12/04/2015  Hearing? N -  Vision? N -  Difficulty concentrating or making decisions? N -  Walking or climbing stairs? Y -  Dressing or bathing? Y -  Doing errands, shopping? Tempie Donning  Preparing Food and eating ? - -  Using the Toilet? - -  In the past six months, have you accidently leaked urine? - -  Do you have problems with loss of bowel control? - -  Managing your Medications? - Y  Managing your Finances? - -  Housekeeping or managing your Housekeeping? - Y    Fall/Depression Screening:    PHQ 2/9 Scores 12/01/2015 11/09/2015 04/26/2014 04/26/2014 11/16/2013 10/11/2013  PHQ - 2 Score 0 0 0 2 0 0  PHQ- 9 Score - - 0 7 - -    Assessment:  82 year old gentleman, resides with spouse.   Per pt- c/o nausea, dry heaves for a week, 5.8 lb weight loss in 2 days.   Vital signs stable.  RN CM called Dr. Samella Parr office, spoke with nurse Maudie Mercury and  report provided on pt's nausea,  dry heaves, weight loss, vital signs-  Kim to relay information to MD, call both nurse and pt back.                            Plan:   As discussed, Kim (Dr. Samella Parr nurse) to call pt back - let him know what MD said related to his nausea/dry heaves/weight loss.             Kim (Dr. Samella Parr nurse) to f/u with RN CM about MD's recommendations for pt.             Plan to continue to follow pt for transition of care, next f/u telephonically 6/1.    THN CM Care Plan Problem Three        Most Recent Value   Care Plan Problem Three  Risk for readmission- 3 admits in 6 months.    Role Documenting the Problem Three  Care Management Coordinator   Care Plan for Problem Three  Active   THN Long Term Goal (31-90) days  Pt would not readmit 31 days from day of discharge.    THN Long Term Goal Start Date  12/12/15   Interventions for Problem Three Long Term Goal  home visit done- part of transition of care    THN CM Short Term Goal #1 (0-30 days)  Pt would not have any recurrent episodes of weakness, dizziness in the next 30 days    THN CM Short Term Goal #1 Start Date  12/12/15   Interventions for Short Term Goal #1  Reviewed with pt recent medication changes - result of recent MD visit 5/15.    THN CM Short Term Goal #2 (0-30 days)  Pt would be able to eat without any issues within the  next 7 days    THN CM Short Term Goal #2 Start Date  12/29/15   Interventions for Short Term Goal #2  RN CM called MD office- report nausea/dry heaves for past week, weight loss      Zara Chess.   [place] [facility]  [phone]

## 2016-01-02 ENCOUNTER — Other Ambulatory Visit: Payer: Self-pay | Admitting: *Deleted

## 2016-01-02 NOTE — Patient Outreach (Signed)
Attempt made to contact pt, f/u on recent ED visit 5/26.  Unable to contact pt on his home phone and cell phone, HIPPA compliant voice message left on both phones.  If no response, will try again.     Zara Chess.   [place] [facility] Management  [phone]

## 2016-01-02 NOTE — Patient Outreach (Signed)
Pt discussed in [facility]  difficult case discussion today (3 admits in 6 months, 2 ED visits, most recent ED visit  5/26).  This RN CM following for transition of care, did a home visit 5/26 where pt c/o nausea, dry heaves, MD office called, pt told to f/u at ED.  Pt did present to ED- nausea, dehydration and view in ED MD notes- colchicine possible cause, discharged, pt to f/u with primary care MD.    As discussed, RN CM to f/u with pt on recent ED visit, if scheduled f/u with Dr. Zackery Barefoot.    Note- no further follow up for difficult case discussion.   RN CM to f/u with pt on recent ED visit, ongoing transition of care.   Zara Chess.   [place] Care Management  [phone]

## 2016-01-03 DIAGNOSIS — I2510 Atherosclerotic heart disease of native coronary artery without angina pectoris: Secondary | ICD-10-CM | POA: Diagnosis not present

## 2016-01-03 DIAGNOSIS — Z7901 Long term (current) use of anticoagulants: Secondary | ICD-10-CM | POA: Diagnosis not present

## 2016-01-03 DIAGNOSIS — Z87891 Personal history of nicotine dependence: Secondary | ICD-10-CM | POA: Diagnosis not present

## 2016-01-03 DIAGNOSIS — I110 Hypertensive heart disease with heart failure: Secondary | ICD-10-CM | POA: Diagnosis not present

## 2016-01-03 DIAGNOSIS — I272 Other secondary pulmonary hypertension: Secondary | ICD-10-CM | POA: Diagnosis not present

## 2016-01-03 DIAGNOSIS — E785 Hyperlipidemia, unspecified: Secondary | ICD-10-CM | POA: Diagnosis not present

## 2016-01-03 DIAGNOSIS — M150 Primary generalized (osteo)arthritis: Secondary | ICD-10-CM | POA: Diagnosis not present

## 2016-01-03 DIAGNOSIS — Z7982 Long term (current) use of aspirin: Secondary | ICD-10-CM | POA: Diagnosis not present

## 2016-01-03 DIAGNOSIS — E119 Type 2 diabetes mellitus without complications: Secondary | ICD-10-CM | POA: Diagnosis not present

## 2016-01-03 DIAGNOSIS — R110 Nausea: Secondary | ICD-10-CM | POA: Diagnosis not present

## 2016-01-03 DIAGNOSIS — I5032 Chronic diastolic (congestive) heart failure: Secondary | ICD-10-CM | POA: Diagnosis not present

## 2016-01-03 DIAGNOSIS — R627 Adult failure to thrive: Secondary | ICD-10-CM | POA: Diagnosis not present

## 2016-01-04 ENCOUNTER — Ambulatory Visit (INDEPENDENT_AMBULATORY_CARE_PROVIDER_SITE_OTHER): Payer: Medicare Other | Admitting: Family Medicine

## 2016-01-04 ENCOUNTER — Ambulatory Visit: Payer: Self-pay | Admitting: *Deleted

## 2016-01-04 ENCOUNTER — Encounter: Payer: Self-pay | Admitting: *Deleted

## 2016-01-04 ENCOUNTER — Other Ambulatory Visit: Payer: Self-pay | Admitting: *Deleted

## 2016-01-04 ENCOUNTER — Telehealth: Payer: Self-pay | Admitting: Family Medicine

## 2016-01-04 VITALS — BP 138/82 | HR 72 | Temp 97.6°F | Resp 16 | Ht 70.0 in | Wt 196.0 lb

## 2016-01-04 DIAGNOSIS — R296 Repeated falls: Secondary | ICD-10-CM | POA: Diagnosis not present

## 2016-01-04 DIAGNOSIS — R55 Syncope and collapse: Secondary | ICD-10-CM | POA: Diagnosis not present

## 2016-01-04 DIAGNOSIS — R42 Dizziness and giddiness: Secondary | ICD-10-CM

## 2016-01-04 DIAGNOSIS — R627 Adult failure to thrive: Secondary | ICD-10-CM | POA: Diagnosis not present

## 2016-01-04 DIAGNOSIS — E119 Type 2 diabetes mellitus without complications: Secondary | ICD-10-CM | POA: Diagnosis not present

## 2016-01-04 DIAGNOSIS — R600 Localized edema: Secondary | ICD-10-CM

## 2016-01-04 DIAGNOSIS — I110 Hypertensive heart disease with heart failure: Secondary | ICD-10-CM | POA: Diagnosis not present

## 2016-01-04 DIAGNOSIS — Z7982 Long term (current) use of aspirin: Secondary | ICD-10-CM | POA: Diagnosis not present

## 2016-01-04 DIAGNOSIS — I272 Other secondary pulmonary hypertension: Secondary | ICD-10-CM | POA: Diagnosis not present

## 2016-01-04 DIAGNOSIS — I5032 Chronic diastolic (congestive) heart failure: Secondary | ICD-10-CM | POA: Diagnosis not present

## 2016-01-04 DIAGNOSIS — Z87891 Personal history of nicotine dependence: Secondary | ICD-10-CM | POA: Diagnosis not present

## 2016-01-04 DIAGNOSIS — Z7901 Long term (current) use of anticoagulants: Secondary | ICD-10-CM | POA: Diagnosis not present

## 2016-01-04 DIAGNOSIS — I2510 Atherosclerotic heart disease of native coronary artery without angina pectoris: Secondary | ICD-10-CM | POA: Diagnosis not present

## 2016-01-04 DIAGNOSIS — E785 Hyperlipidemia, unspecified: Secondary | ICD-10-CM | POA: Diagnosis not present

## 2016-01-04 DIAGNOSIS — M150 Primary generalized (osteo)arthritis: Secondary | ICD-10-CM | POA: Diagnosis not present

## 2016-01-04 DIAGNOSIS — R110 Nausea: Secondary | ICD-10-CM | POA: Diagnosis not present

## 2016-01-04 NOTE — Progress Notes (Signed)
Subjective:    Patient ID: [name], male    DOB: March 24, 1929, 82 y.o.   MRN: [id number]  HPI   Was recently admitted to the hospital at the end of March with acute congestive heart failure. I have copied relevant portions of the discharge summary and included them below for my reference:    Admit date: 10/17/2015 Discharge date: 10/31/2015  Primary Care Provider: [facility] TOM Primary Cardiologist: Dr. Percival Spanish  Discharge Diagnoses   Principal Problem:  Acute on chronic diastolic CHF Active Problems:  Hypertension  Heart failure (HCC)  Pressure ulcer  Paroxysmal Atrial fibrillation  Altered mental status  Elevated LFTs and coagulopathy  Coronary artery disease s/p DES to LAD and RCA in 4/16  Normocytic anemia.  Chronic subdural hematoma   R groin wound  Dysphagia  R ankle pain  AKI  S/p mini MV repair on 10/04/2015  Allergies No Known Allergies  Diagnostic Studies/Procedures      History of Present Illness    Mr. [name] is an 82 yo white male with long-standing history of mitral valve prolapse with mitral regurgitation and chronic diastolic congestive heart failure, coronary artery disease status post PCI and stenting of the LAD and RCA using drug-eluting stents, recurrent SVT s/p DCCV, pulmonary hypertension, type 2 diabetes mellitus, hyperlipidemia, and chronic back pain who recently underwent minimally invasive Mitral Valve Repair 10/04/15 and presented [facility] ED with SOB and LE edema 10/17/15.   Patient recently underwent a minimally-invasive mitral valve repair on 3/1 and his post-op course was complicated by LVOT obstruction due to [facility], which resolved with adjustment of inotropic medications. He developed atrial fibrillation and was put on amiodarone and Coumadin. He was discharged on 3/10 to home.  Upon presentation, the patient was altered on exam and not able to answer questions consistently, so history was obtained  from speaking to the wife on the phone [phone]) and chart review. Per wife, he has been very confused and not himself since dischage. He has been talking all night, not making any sense. He gets up constantly to urinate, but nothing comes out. His personality is different, as well, he has been uncharacteristically rude to his wife, telling her she was the devil. She notes that he can't swallow and easily chokes, even a small pill, since being home. His wife has been giving him his meds in apple sauce. He hasn't really ate anything of substance since coming home. He has been taking his Coumadin since discharge. His wife also notes that his LE edema has become significantly worse since discharge a few days ago.  In ED - BNP 763; Troponin (POC) 0.44; INR 6.13 (up from 3.66 on 3/10 at discharge)  Bedside sonosite echo performed by cardiology fellow was technically difficult, revealed EF >55%, possible RV dilation and low normal function. No significant mitral regurgitation was appreciated. Left atrial enlargement. Small pericardial effusion without evidence of tamponade.   Hospital Course    Consultants: Cardiothoracic Surgery, urology, would care  1. Acute on chronic diastolic CHF - significant - however tricky situation, not good response to iv lasix, soft BP and LVOT obstruction, we don't want to replete intravascular volume. Felt better after DCCV and maintaining sinus rhythm. Diuresed with IV lasix and zaroxolyn then diuretics were hold due to elevated creatinine. Leading to minimal improvement of Scr. It was felt that managing his volume status would be hard for the remainder of his life. I&O negative 2.3L with 24lb weight loss (220-->196lb). Uncertain about I/O  accuracy and weight. BP soft low. Reduced metoprolol to 12.50m BID.   2. Altered mental status/Subdural hematoma.  Given INR >6, concern for possible intracranial bleed, CT head showed 14 mm RIGHT holo hemispheric  chronic subdural hematoma without midline shift (minimally increased from prior).-- check ammonia level was 24 (WNL). Improved AMS with improvement of INR.   3. Elevated LFTs: . Likely due to hepatic congestion, although more of an obstructive pattern, with alk phos and total bili elevated and minimal transaminitis. Less likely due to amiodarone, which was recently initiated. Abdominal ultrasound showed stones and sludge within GB and GB wall thickening, possible mild hepatic cirrhosis, minimal ascites. LFT improved with diuresis.  4. Coagulopathy: Held Coumadin as INR of > 6. Given Vitamin K, even though his INR is < 10 and he has no overt bleeding, he is at greater risk of bleeding (age, decompensated heart failure, and expanding chronic subdural hematoma on head CT). Placed on heparin when INR was less than 2 for anticoagulation. Coumadin managed by pharmacy. Discharge dose adjusted by pharmacy and recheck INR in 1 week.   5. Paroxysmal Atrial fibrillation. Rate was controlled. Started on IV heparin for anticoagulation. Subsequently underwent DCCV and maintained sinus rhythm throughout. Post DCCV restarted on coumadin. hed has been adequately loaded with amiodarone 400 mg twice a day since 10/13/15 to 10/29/15. Reduced to 2085mQD as maintenance dose. After proximally 1 month if he remains in sinus rhythm, advocate discontinuation of amiodarone.  6. Leukocytosis. Normal differential. No other objective evidence of infection, likely reactive in setting of acute illness. Surgical incisions appear to be healing well. CXR was concerning for PNA and started on Ceftriaxone and Vancomycin .   7. Hematuria and UTI: hx of prostate cancer: Initial UA was clear. The patient has urinary retention at home after recent discharge. Demmed foley was placed in ED. Than he has UTI and bloody urine in foley bag. Ceftriaxone will treat it. Seen by Urology for hematuria which removed foley with resolution of hematuria.     8. Coronary artery disease s/p DES to LAD and RCA in 4/16. EKG without acute ischemic changes. Troponin is mildly elevated (0.55-->0.41), but this is likely demand in the setting of volume overload. Was not discharged on DAPT, but he is almost 1 year out from intervention, so low risk for stent thrombosis. Continued aspirin 81 mg, as there is no overt signs of bleeding. Continue pravastatin (unclear why he is not on a high-potency statin, but this can be addressed by his primary cardiologist)  9. Normocytic anemia. No significant difference from baseline while in the hospital, monitored closely in the setting of elevated INR. Remained stable.   10 AKI. Scr worsened with diuretics and initiation of colchicine. Recheck BMET as outpatient.   11. R ankle pain/ possible gout: Uric acid was elevated. Tx with colchicine. Improved. Decreased dose due to elevated creatinine. Discontinue colchicine during office visit. F/u with PCP.   12. Right groin wound: superficially dehisced and slight yellow slough. Followed by wound care. Plan made to keep it dry. Will followed by Surgery closely and will schedule f/u appointment.   13. Dysphagia: underwent speech evaluation. Recommended Nectar-thick liquid.   The patient has been seen by Dr. SkMarlou Porchnd Dr.Owen today and deemed ready for discharge home. All follow-up appointments have been scheduled. Discharge medications are listed below.   Will resume lasix 2091mO QD. Decrease KCL to 43m42mD. BMET in week. Would care f/u with TCTS. INR check in week.  12/01/15 He has already followed up with Dr. Ricard Dillon.  I have reviewed that office visit. No changes were made in his medication. At the present time he does have +1 pitting edema in his right leg. He only has trace pitting edema in his left leg. For this patient that is actually very good. He appears to be euvolemic. He is in normal sinus rhythm with a systolic ejection murmur but there are no crackles or  evidence of pulmonary edema on his pulmonary exam. He denies any trouble breathing. His INR was subtherapeutic earlier this week but the cardiologist is only changed his Coumadin dose and they're scheduled follow-up next week. He has an appointment to see his cardiologist on May 5. His renal function was checked a little more than a week ago and was normal. His primary concern today is persistent pain radiating into his left leg. The pain is located on the anterior portion of his left thigh. It radiates from his lower back down the anterior left thigh to his knee. It is burning and stinging. It is constant. It radiates and throbs. There is no exacerbating or alleviating factors. Review of his MRI of his lumbar spine 2010 revealed severe spinal stenosis and also by foraminal stenosis worse at L5-S1 all of which could cause lumbar radiculopathy.  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 Begin gabapentin 100 mg by mouth daily and gradually increased to 100 mg by mouth 3 times a day. Monitor for sedation and confusion. Recheck next week or sooner if worse.   12/18/15 Since I last saw the patient, he has been admitted to the hospital with "weakness". Patient is a difficult historian, however his symptoms sound like orthostatic dizziness and weakness and presyncope. Whenever he is walking or stands from a seated position, he will suddenly become lightheaded and have to slump to the floor to avoid falling. There have been episodes of seizure-like activity which were deemed to be due to cerebral hypoperfusion secondary to low blood pressure. Patient denies any loss of consciousness although he has had what sounds like two witnessed generalized tonic-clonic episodes secondary to cerebral hypoperfusion. Gabapentin was held in the hospital however he continues to have orthostatic dizziness. Of note, the patient has recently been found to have a subdural hematoma which could put him at higher risk  for seizures.  This was discovered on a CT scan of the head in March as dictated below: 14 mm RIGHT holo hemispheric chronic subdural hematoma without midline shift. At that time, my plan was: This is a difficult case. He is not truly orthostatic today on exam although his history sounds very much like that. I believe we need to take steps to minimize orthostatic dizziness. First, discontinue Flomax as this can certainly can contribute. Based on the recent hospital discharge summary it sounds like the patient may have had an episode of bradycardia.  He is currently on metoprolol 12.5 mg by mouth twice a day. This is partly due to atrial fibrillation and partly due to his coronary artery disease. I'm going to temporarily discontinue the metoprolol to see if his symptoms will improve. I would have a low threshold to put the patient back on a beta blocker given his significant cardiovascular history but I will see if his symptoms will improve off of the beta blocker. I'm also going to consult neurology for possible EEG to see if he could be experiencing seizure activity as well as drop attacks due to seizure  activity particular given his subdural hematoma. Recheck in one week.  His weight also continues to drop and there is no evidence of peripheral edema or pulmonary edema on exam today so I'm going to decrease his Lasix to 20 mg by mouth twice a day in an effort to avoid cerebral hypoperfusion. Recheck in one week or immediately if worse   12/25/15 Patient states that he feels much better. He no longer feels weak and lightheaded. He denies any further orthostatic syncope or near syncope. He denies any further falls. He is starting to ambulate more without difficulty. His weight is unchanged. On examination today he has no pedal edema and his lungs are clear. His heart rate is within normal limits.  At that time, my plan was: t appears that the majority of his symptoms were due to weakness and orthostatic  hypotension. At the present time I will make no changes in his medication regimen. I would like to see the patient back in one month. I do believe that as he gets stronger and recover from his recent open heart surgery and hospitalizations that we will likely need to increase his dose of Lasix and resume his metoprolol. I certainly believe that it will be for the best to be on metoprolol at the present time. However, patient is too weak to tolerate the medication. I will recheck him in one month and likely resume it at that time. Should the patient develop a rapid heart rate we will resume an earlier. Should he retain fluid we will increase his dose of Lasix sooner. At the present time he denies any urinary urgency or hesitancy and we will continue to refrain from using Flomax  01/04/16 Since I last saw the patient, he saw his cardiologist who recommended discontinuing amiodarone and resuming a low-dose metoprolol. Patient is unsure if he is taking metoprolol. He has no medication list with him. The medication list he brings is an old medication list. Also since last time I saw him he went to the emergency room with intractable nausea and vomiting. This has since subsided and now the patient states that he feels much better and that he is eating better. He does have trace to +1 edema in his right leg distal to the shin however there is no edema in his left leg and his weight is down approximately 4 pounds from his last office visit.  Past Medical History  Diagnosis Date  . Coronary artery disease     a. 11/2014 NSTEMI: DESx 2 placed to LAD and RCA; b. 08/2015 Cath: LAD patent stent, LCX 43m RCA patent stent, 263m . Diabetes mellitus, type II (HCOs[place]    a. HgA1c 6.8 in 03/2015  . SVT (supraventricular tachycardia) (HCC)     a. recurrent, usually responsive to vagal manuevers  . Pulmonary HTN ([place]  . Lower extremity edema     a. chronic  . HLD (hyperlipidemia)   . Hypertensive heart disease   . OA  (osteoarthritis)   . ED (erectile dysfunction)   . Chronic fatigue   . Gout   . Spinal stenosis   . Prostate cancer ([facility]  . Severe mitral regurgitation     a.  10/2015 s/p minimally invasive MV repair; b. 10/2015 Echo: mod MS, mean grad 53m72m.  . CMarland Kitchenronic diastolic (congestive) heart failure (HCCNash[place]   a. 10/2013 Echo: EF 55-60%, basal-mid anteroseptal and basal-mid inferoseptal HK.  . HMarland Kitchenmangioma of liver 08/02/2015    12  mm enhancing lesion seen on CT - suspicious for benign hemangioma but not diagnostic - MRI recommended  . Kidney stones   . Physical deconditioning   . PAF (paroxysmal atrial fibrillation) ([place])     a. 10/2015 s/p DCCV;  b. CHA2DS2VASc = 6--> on coumadin; c. 10/2015 Echo: sev dil LA.  . Orthostatic hypotension   . Tricuspid regurgitation     a. 10/2015 Echo: mild to mod TR.  Marland Kitchen Pulmonary hypertension ([place])     a. 10/2015 Echo: PASP 41mHg.  .Marland KitchenChronic subdural hematoma ([place]     a. 12/2015 Head CT: chronic right holo-hemispheric SDH w/o midline shift.   Past Surgical History  Procedure Laterality Date  . Cardiac surgery      2 cardiac stents  . Joint replacement      shoulder  . Back surgery x 2    . Rt rotator cuff repair    . Cervical spine surgery    . Back surgery    . Left heart catheterization with coronary angiogram N/A 11/16/2014    Procedure: LEFT HEART CATHETERIZATION WITH CORONARY ANGIOGRAM;  Surgeon: TTroy Sine MD;  [facility];  Service: Cardiovascular;  Laterality: N/A;  . Tee without cardioversion N/A 06/22/2015    Procedure: TRANSESOPHAGEAL ECHOCARDIOGRAM (TEE);  Surgeon: TSkeet Latch MD;  [place] [facility]  Service: Cardiovascular;  Laterality: N/A;  . Cardiac catheterization N/A 08/08/2015    Procedure: Right/Left Heart Cath and Coronary Angiography;  Surgeon: MSherren Mocha MD;  [facility];  Service: Cardiovascular;  Laterality: N/A;  . Eye surgery    . Mitral valve repair Right 10/04/2015    Procedure:  MINIMALLY INVASIVE MITRAL VALVE REPAIR (MVR);  Surgeon: CRexene Alberts MD;  [place]  Service: Open Heart Surgery;  Laterality: Right;  . Tee without cardioversion N/A 10/04/2015    Procedure: TRANSESOPHAGEAL ECHOCARDIOGRAM (TEE);  Surgeon: CRexene Alberts MD;  [place]  Service: Open Heart Surgery;  Laterality: N/A;  . Wound exploration N/A 10/05/2015    Procedure: Sternal Incision;  Surgeon: CRexene Alberts MD;  [place]  Service: Open Heart Surgery;  Laterality: N/A;  . Intraoperative transesophageal echocardiogram  10/05/2015    Procedure: INTRAOPERATIVE TRANSESOPHAGEAL ECHOCARDIOGRAM;  Surgeon: CRexene Alberts MD;  [place]  Service: Open Heart Surgery;;  . Cardioversion N/A 10/20/2015    Procedure: CARDIOVERSION;  Surgeon: KPixie Casino MD;  [facility];  Service: Cardiovascular;  Laterality: N/A;   Current Outpatient Prescriptions on File Prior to Visit  Medication Sig Dispense Refill  . aspirin EC 81 MG tablet Take 81 mg by mouth daily. Reported on 12/29/2015    . finasteride (PROSCAR) 5 MG tablet TAKE ONE (1) TABLET EACH DAY 30 tablet 3  . fluticasone (FLONASE) 50 MCG/ACT nasal spray Place 1 spray into both nostrils daily as needed for allergies or rhinitis.    . furosemide (LASIX) 40 MG tablet Take 20 mg by mouth 2 (two) times daily.     . metoprolol tartrate (LOPRESSOR) 25 MG tablet Take 0.5 tablets (12.5 mg total) by mouth 2 (two) times daily. 30 tablet 11  . ondansetron (ZOFRAN) 4 MG tablet Take 1 tablet (4 mg total) by mouth every 8 (eight) hours as needed for nausea or vomiting. 20 tablet 0  . potassium chloride SA (K-DUR,KLOR-CON) 20 MEQ tablet Take 1 tablet (20 mEq total) by mouth daily. 30 tablet 1  . pravastatin (PRAVACHOL) 40 MG tablet Take 1  tablet (40 mg total) by mouth daily at 6 PM. 30 tablet 11  . tamsulosin (FLOMAX) 0.4 MG CAPS capsule Take 1 capsule (0.4 mg total) by mouth daily. 90 capsule 1  . traMADol (ULTRAM) 50 MG tablet  Take 1 tablet (50 mg total) by mouth every 6 (six) hours as needed for moderate pain. 20 tablet 0  . warfarin (COUMADIN) 2 MG tablet Take 0.5 tablets (1 mg total) by mouth daily at 6 PM. Or as directed (Patient taking differently: Take 1 mg by mouth daily at 6 PM. 2 mg Monday- Friday. 1 mg all other days.) 30 tablet 1   No current facility-administered medications on file prior to visit.   No Known Allergies Social History   Social History  . Marital Status: Married    Spouse Name: N/A  . Number of Children: 4  . Years of Education: N/A   Occupational History  .     Social History Main Topics  . Smoking status: Former Research scientist (life sciences)  . Smokeless tobacco: Never Used     Comment: QUIT SMOKING  MANY YEARS AGO"  . Alcohol Use: No  . Drug Use: No  . Sexual Activity: Not on file   Other Topics Concern  . Not on file   Social History Narrative   ** Merged History Encounter **       Four adopted children.  Lives alone.      Review of Systems  All other systems reviewed and are negative.      Objective:   Physical Exam  Neck: Neck supple. No JVD present.  Cardiovascular: Normal rate and regular rhythm.   Murmur heard. Pulmonary/Chest: Effort normal. He has no wheezes. He has no rales.  Abdominal: Soft. Bowel sounds are normal.  Musculoskeletal: He exhibits edema.  Lymphadenopathy:    He has no cervical adenopathy.  Neurological: He has normal reflexes. No cranial nerve deficit. He exhibits normal muscle tone.  Vitals reviewed.         Assessment & Plan:  Orthostatic dizziness  Falls frequently  Near syncope  Localized edema  His weakness and dizziness have improved since his blood pressure has improved. He has had no further syncopal episodes. His nausea and vomiting seem to been transient and have now resolved. He does have some trace edema in his right leg however I will not increase his diuretic at the present time as he is not symptomatic and he has lost weight  due to poor by mouth intake. Recheck in one month and reassess diuretic use at that point

## 2016-01-04 NOTE — Patient Outreach (Signed)
Second attempt made to contact pt, f/u on recent ED visit, ongoing transition of care.   HIPPA compliant voice message left with contact number.  If no response, will try again.    Zara Chess.   [facility]  [phone]

## 2016-01-04 NOTE — Telephone Encounter (Signed)
His cardiologist has been dosing his coumadin.  Results need to be sent to their coumadin clinic.  Subtherapeutic.

## 2016-01-04 NOTE — Patient Outreach (Signed)
[name]) Care Management  01/04/2016  [name] [birth year] [id number]   Phone call to patient to assess for continued social work involvement following his skilled nursing home stay.  Patient verbalized having no social work needs at this time.  Patient to be closed to social work at this time.    [name] [name] Care Management [phone]

## 2016-01-04 NOTE — Telephone Encounter (Signed)
[place] nurse calling to report PT/INR  1.3.   Please advise?

## 2016-01-05 ENCOUNTER — Ambulatory Visit (INDEPENDENT_AMBULATORY_CARE_PROVIDER_SITE_OTHER): Payer: Medicare Other | Admitting: Pharmacist Clinician (PhC)/ Clinical Pharmacy Specialist

## 2016-01-05 DIAGNOSIS — M150 Primary generalized (osteo)arthritis: Secondary | ICD-10-CM | POA: Diagnosis not present

## 2016-01-05 DIAGNOSIS — E119 Type 2 diabetes mellitus without complications: Secondary | ICD-10-CM | POA: Diagnosis not present

## 2016-01-05 DIAGNOSIS — R627 Adult failure to thrive: Secondary | ICD-10-CM | POA: Diagnosis not present

## 2016-01-05 DIAGNOSIS — Z9889 Other specified postprocedural states: Secondary | ICD-10-CM

## 2016-01-05 DIAGNOSIS — Z7901 Long term (current) use of anticoagulants: Secondary | ICD-10-CM | POA: Diagnosis not present

## 2016-01-05 DIAGNOSIS — E785 Hyperlipidemia, unspecified: Secondary | ICD-10-CM | POA: Diagnosis not present

## 2016-01-05 DIAGNOSIS — I272 Other secondary pulmonary hypertension: Secondary | ICD-10-CM | POA: Diagnosis not present

## 2016-01-05 DIAGNOSIS — I5032 Chronic diastolic (congestive) heart failure: Secondary | ICD-10-CM | POA: Diagnosis not present

## 2016-01-05 DIAGNOSIS — Z87891 Personal history of nicotine dependence: Secondary | ICD-10-CM | POA: Diagnosis not present

## 2016-01-05 DIAGNOSIS — I110 Hypertensive heart disease with heart failure: Secondary | ICD-10-CM | POA: Diagnosis not present

## 2016-01-05 DIAGNOSIS — R110 Nausea: Secondary | ICD-10-CM | POA: Diagnosis not present

## 2016-01-05 DIAGNOSIS — I2510 Atherosclerotic heart disease of native coronary artery without angina pectoris: Secondary | ICD-10-CM | POA: Diagnosis not present

## 2016-01-05 DIAGNOSIS — Z7982 Long term (current) use of aspirin: Secondary | ICD-10-CM | POA: Diagnosis not present

## 2016-01-05 LAB — POCT INR: INR: 1.3

## 2016-01-05 NOTE — Telephone Encounter (Signed)
See anticoag note

## 2016-01-05 NOTE — Telephone Encounter (Signed)
Result routed to Cardiology

## 2016-01-08 DIAGNOSIS — M150 Primary generalized (osteo)arthritis: Secondary | ICD-10-CM | POA: Diagnosis not present

## 2016-01-08 DIAGNOSIS — R110 Nausea: Secondary | ICD-10-CM | POA: Diagnosis not present

## 2016-01-08 DIAGNOSIS — Z87891 Personal history of nicotine dependence: Secondary | ICD-10-CM | POA: Diagnosis not present

## 2016-01-08 DIAGNOSIS — Z7982 Long term (current) use of aspirin: Secondary | ICD-10-CM | POA: Diagnosis not present

## 2016-01-08 DIAGNOSIS — R627 Adult failure to thrive: Secondary | ICD-10-CM | POA: Diagnosis not present

## 2016-01-08 DIAGNOSIS — E119 Type 2 diabetes mellitus without complications: Secondary | ICD-10-CM | POA: Diagnosis not present

## 2016-01-08 DIAGNOSIS — I272 Other secondary pulmonary hypertension: Secondary | ICD-10-CM | POA: Diagnosis not present

## 2016-01-08 DIAGNOSIS — I5032 Chronic diastolic (congestive) heart failure: Secondary | ICD-10-CM | POA: Diagnosis not present

## 2016-01-08 DIAGNOSIS — Z7901 Long term (current) use of anticoagulants: Secondary | ICD-10-CM | POA: Diagnosis not present

## 2016-01-08 DIAGNOSIS — I2510 Atherosclerotic heart disease of native coronary artery without angina pectoris: Secondary | ICD-10-CM | POA: Diagnosis not present

## 2016-01-08 DIAGNOSIS — E785 Hyperlipidemia, unspecified: Secondary | ICD-10-CM | POA: Diagnosis not present

## 2016-01-08 DIAGNOSIS — I110 Hypertensive heart disease with heart failure: Secondary | ICD-10-CM | POA: Diagnosis not present

## 2016-01-10 ENCOUNTER — Ambulatory Visit: Payer: Self-pay | Admitting: *Deleted

## 2016-01-10 ENCOUNTER — Other Ambulatory Visit: Payer: Self-pay | Admitting: *Deleted

## 2016-01-10 NOTE — Patient Outreach (Signed)
Another attempt made to contact pt as part of ongoing transition of care.  HIPPA compliant voice message left with contact name and number.   If no response, will try one more time- no response then will send unable to contact letter.   [name].   [facility]  [phone]

## 2016-01-11 ENCOUNTER — Other Ambulatory Visit: Payer: Self-pay | Admitting: *Deleted

## 2016-01-11 ENCOUNTER — Encounter: Payer: Self-pay | Admitting: *Deleted

## 2016-01-11 NOTE — Patient Outreach (Signed)
Transition of care call successful (final call, discharged 5/8).   Spoke with pt who reports getting strength back slowly, dizziness is better, walking with a cane.   Pt reports has a little swelling in right leg, last weight was yesterday - 197 lbs been staying there for 3 days.  Pt reports MD instructed him on taking extra Lasix for increased swelling.  Pt reports he is taking all of his medications.  Pt reports HH PT coming three times a week, daily doing f/u exercises plus HH RN coming once a week (checking PT/INR).   RN CM discussed with pt today final transition of care, plan to close case - no further case management needs, pt continues to be followed by [facility] HF program, [facility] services still involved.   Plan to send case closure letter to Dr. Dennard Schaumann by in basket.   As discussed with pt, case closure letter to be sent which includes [facility]'s main contact number to call if needs arise int he future.  Plan to inform [facility] care management assistant to close case.

## 2016-01-12 ENCOUNTER — Telehealth: Payer: Self-pay

## 2016-01-12 DIAGNOSIS — E119 Type 2 diabetes mellitus without complications: Secondary | ICD-10-CM | POA: Diagnosis not present

## 2016-01-12 DIAGNOSIS — R110 Nausea: Secondary | ICD-10-CM | POA: Diagnosis not present

## 2016-01-12 DIAGNOSIS — I2510 Atherosclerotic heart disease of native coronary artery without angina pectoris: Secondary | ICD-10-CM | POA: Diagnosis not present

## 2016-01-12 DIAGNOSIS — Z7982 Long term (current) use of aspirin: Secondary | ICD-10-CM | POA: Diagnosis not present

## 2016-01-12 DIAGNOSIS — E785 Hyperlipidemia, unspecified: Secondary | ICD-10-CM | POA: Diagnosis not present

## 2016-01-12 DIAGNOSIS — H26491 Other secondary cataract, right eye: Secondary | ICD-10-CM | POA: Diagnosis not present

## 2016-01-12 DIAGNOSIS — H26492 Other secondary cataract, left eye: Secondary | ICD-10-CM | POA: Diagnosis not present

## 2016-01-12 DIAGNOSIS — Z7901 Long term (current) use of anticoagulants: Secondary | ICD-10-CM | POA: Diagnosis not present

## 2016-01-12 DIAGNOSIS — Z961 Presence of intraocular lens: Secondary | ICD-10-CM | POA: Diagnosis not present

## 2016-01-12 DIAGNOSIS — I110 Hypertensive heart disease with heart failure: Secondary | ICD-10-CM | POA: Diagnosis not present

## 2016-01-12 DIAGNOSIS — I5032 Chronic diastolic (congestive) heart failure: Secondary | ICD-10-CM | POA: Diagnosis not present

## 2016-01-12 DIAGNOSIS — I272 Other secondary pulmonary hypertension: Secondary | ICD-10-CM | POA: Diagnosis not present

## 2016-01-12 DIAGNOSIS — Z87891 Personal history of nicotine dependence: Secondary | ICD-10-CM | POA: Diagnosis not present

## 2016-01-12 DIAGNOSIS — I10 Essential (primary) hypertension: Secondary | ICD-10-CM | POA: Diagnosis not present

## 2016-01-12 DIAGNOSIS — M150 Primary generalized (osteo)arthritis: Secondary | ICD-10-CM | POA: Diagnosis not present

## 2016-01-12 DIAGNOSIS — R627 Adult failure to thrive: Secondary | ICD-10-CM | POA: Diagnosis not present

## 2016-01-12 NOTE — Telephone Encounter (Signed)
Subtherapeutic, change coumadin to 4 mg M,W,F and 2 mg on other days.  Recheck in 1 week

## 2016-01-12 NOTE — Telephone Encounter (Signed)
Call placed to [facility].   Advised of MD recommendations.   Also advised to F/U with Dr. Percival Spanish, cardiologist for further testing orders.

## 2016-01-12 NOTE — Telephone Encounter (Signed)
Mendel Ryder with [facility] home [facility] reported patient PT/INR.  INR- 1.3 PT 15.8.  Patient is currently taking coumadin 2mg  daily.  Please call Mendel Ryder with recommendations.

## 2016-01-15 DIAGNOSIS — E119 Type 2 diabetes mellitus without complications: Secondary | ICD-10-CM | POA: Diagnosis not present

## 2016-01-15 DIAGNOSIS — I2510 Atherosclerotic heart disease of native coronary artery without angina pectoris: Secondary | ICD-10-CM | POA: Diagnosis not present

## 2016-01-15 DIAGNOSIS — Z8546 Personal history of malignant neoplasm of prostate: Secondary | ICD-10-CM | POA: Diagnosis not present

## 2016-01-15 DIAGNOSIS — Z87891 Personal history of nicotine dependence: Secondary | ICD-10-CM | POA: Diagnosis not present

## 2016-01-15 DIAGNOSIS — R110 Nausea: Secondary | ICD-10-CM | POA: Diagnosis not present

## 2016-01-15 DIAGNOSIS — M150 Primary generalized (osteo)arthritis: Secondary | ICD-10-CM | POA: Diagnosis not present

## 2016-01-15 DIAGNOSIS — R627 Adult failure to thrive: Secondary | ICD-10-CM | POA: Diagnosis not present

## 2016-01-15 DIAGNOSIS — I110 Hypertensive heart disease with heart failure: Secondary | ICD-10-CM | POA: Diagnosis not present

## 2016-01-15 DIAGNOSIS — E785 Hyperlipidemia, unspecified: Secondary | ICD-10-CM | POA: Diagnosis not present

## 2016-01-15 DIAGNOSIS — I5032 Chronic diastolic (congestive) heart failure: Secondary | ICD-10-CM | POA: Diagnosis not present

## 2016-01-15 DIAGNOSIS — I272 Other secondary pulmonary hypertension: Secondary | ICD-10-CM | POA: Diagnosis not present

## 2016-01-15 DIAGNOSIS — Z7901 Long term (current) use of anticoagulants: Secondary | ICD-10-CM | POA: Diagnosis not present

## 2016-01-15 DIAGNOSIS — Z7982 Long term (current) use of aspirin: Secondary | ICD-10-CM | POA: Diagnosis not present

## 2016-01-18 ENCOUNTER — Telehealth: Payer: Self-pay | Admitting: *Deleted

## 2016-01-18 ENCOUNTER — Ambulatory Visit (INDEPENDENT_AMBULATORY_CARE_PROVIDER_SITE_OTHER): Payer: Self-pay | Admitting: Pharmacist

## 2016-01-18 DIAGNOSIS — Z8546 Personal history of malignant neoplasm of prostate: Secondary | ICD-10-CM | POA: Diagnosis not present

## 2016-01-18 DIAGNOSIS — Z7982 Long term (current) use of aspirin: Secondary | ICD-10-CM | POA: Diagnosis not present

## 2016-01-18 DIAGNOSIS — I2510 Atherosclerotic heart disease of native coronary artery without angina pectoris: Secondary | ICD-10-CM | POA: Diagnosis not present

## 2016-01-18 DIAGNOSIS — E119 Type 2 diabetes mellitus without complications: Secondary | ICD-10-CM | POA: Diagnosis not present

## 2016-01-18 DIAGNOSIS — I5032 Chronic diastolic (congestive) heart failure: Secondary | ICD-10-CM | POA: Diagnosis not present

## 2016-01-18 DIAGNOSIS — Z9889 Other specified postprocedural states: Secondary | ICD-10-CM

## 2016-01-18 DIAGNOSIS — I272 Other secondary pulmonary hypertension: Secondary | ICD-10-CM | POA: Diagnosis not present

## 2016-01-18 DIAGNOSIS — I110 Hypertensive heart disease with heart failure: Secondary | ICD-10-CM | POA: Diagnosis not present

## 2016-01-18 DIAGNOSIS — M150 Primary generalized (osteo)arthritis: Secondary | ICD-10-CM | POA: Diagnosis not present

## 2016-01-18 DIAGNOSIS — E785 Hyperlipidemia, unspecified: Secondary | ICD-10-CM | POA: Diagnosis not present

## 2016-01-18 DIAGNOSIS — R110 Nausea: Secondary | ICD-10-CM | POA: Diagnosis not present

## 2016-01-18 DIAGNOSIS — Z87891 Personal history of nicotine dependence: Secondary | ICD-10-CM | POA: Diagnosis not present

## 2016-01-18 DIAGNOSIS — R627 Adult failure to thrive: Secondary | ICD-10-CM | POA: Diagnosis not present

## 2016-01-18 DIAGNOSIS — Z7901 Long term (current) use of anticoagulants: Secondary | ICD-10-CM

## 2016-01-18 LAB — POCT INR: INR: 1.4

## 2016-01-18 NOTE — Telephone Encounter (Signed)
I called to see if [name] would be interested in attending Cardiac Rehab and his wife said that he was not interested.

## 2016-01-19 DIAGNOSIS — E119 Type 2 diabetes mellitus without complications: Secondary | ICD-10-CM | POA: Diagnosis not present

## 2016-01-19 DIAGNOSIS — Z7901 Long term (current) use of anticoagulants: Secondary | ICD-10-CM | POA: Diagnosis not present

## 2016-01-19 DIAGNOSIS — I5032 Chronic diastolic (congestive) heart failure: Secondary | ICD-10-CM | POA: Diagnosis not present

## 2016-01-19 DIAGNOSIS — M150 Primary generalized (osteo)arthritis: Secondary | ICD-10-CM | POA: Diagnosis not present

## 2016-01-19 DIAGNOSIS — I2510 Atherosclerotic heart disease of native coronary artery without angina pectoris: Secondary | ICD-10-CM | POA: Diagnosis not present

## 2016-01-19 DIAGNOSIS — Z7982 Long term (current) use of aspirin: Secondary | ICD-10-CM | POA: Diagnosis not present

## 2016-01-19 DIAGNOSIS — E785 Hyperlipidemia, unspecified: Secondary | ICD-10-CM | POA: Diagnosis not present

## 2016-01-19 DIAGNOSIS — I272 Other secondary pulmonary hypertension: Secondary | ICD-10-CM | POA: Diagnosis not present

## 2016-01-19 DIAGNOSIS — R110 Nausea: Secondary | ICD-10-CM | POA: Diagnosis not present

## 2016-01-19 DIAGNOSIS — Z87891 Personal history of nicotine dependence: Secondary | ICD-10-CM | POA: Diagnosis not present

## 2016-01-19 DIAGNOSIS — R627 Adult failure to thrive: Secondary | ICD-10-CM | POA: Diagnosis not present

## 2016-01-19 DIAGNOSIS — Z8546 Personal history of malignant neoplasm of prostate: Secondary | ICD-10-CM | POA: Diagnosis not present

## 2016-01-19 DIAGNOSIS — I110 Hypertensive heart disease with heart failure: Secondary | ICD-10-CM | POA: Diagnosis not present

## 2016-01-22 ENCOUNTER — Encounter: Payer: Self-pay | Admitting: Thoracic Surgery (Cardiothoracic Vascular Surgery)

## 2016-01-22 ENCOUNTER — Ambulatory Visit (INDEPENDENT_AMBULATORY_CARE_PROVIDER_SITE_OTHER): Payer: Medicare Other | Admitting: Thoracic Surgery (Cardiothoracic Vascular Surgery)

## 2016-01-22 VITALS — BP 130/86 | HR 88 | Resp 16 | Ht 70.0 in | Wt 188.0 lb

## 2016-01-22 DIAGNOSIS — I340 Nonrheumatic mitral (valve) insufficiency: Secondary | ICD-10-CM | POA: Diagnosis not present

## 2016-01-22 DIAGNOSIS — Z9889 Other specified postprocedural states: Secondary | ICD-10-CM

## 2016-01-22 NOTE — Patient Instructions (Signed)
Always bring all of the medications that you take at home with you when you go to all of your doctors' appointments  Continue all previous medications without any changes at this time

## 2016-01-22 NOTE — Progress Notes (Signed)
[address]       [name],[place] [address]             [phone]     CARDIOTHORACIC SURGERY OFFICE NOTE  Referring Provider is [name], MD PCP is [name], MD   HPI:  Patient returns to the office today for follow-up status post minimally invasive mitral valve repair on 10/04/2015 for severe symptomatic primary mitral regurgitation. His immediate postoperative recovery at the time of surgery was notable for systolic anterior motion of the mitral valve with dynamic left ventricular outflow tract obstruction which resolved quickly by adjustment in the patient's medications including addition of a beta blocker. Follow-up echocardiogram performed 10/18/2015 revealed intact mitral valve repair with no residual mitral regurgitation or signs of systolic anterior motion.  He was last seen here in our office on 11/21/2015.  The patient is notably a very poor historian and his numerous other comorbid medical problems were outlined at that time. Since he was last seen in our office the patient was hospitalized in early May with complaints of generalized weakness, shortness of breath and bradycardia with a near syncopal episode.  He was diuresed with Lasix and his dose of amiodarone was decreased.  Since hospital discharge he has been seen on several occasions by Dr. Dennard Schaumann and on one occasion by Ignacia Bayley at [facility].  The patient has continued to complain of nausea and anorexia. He was instructed to stop taking amiodarone by Ignacia Bayley when he was seen on 12/28/2015.  He was instructed to resume taking low-dose metoprolol at that time. However, the patient was not aware of these changes when he was seen by Dr. Dennard Schaumann on 01/04/2016. He returns to our office for follow-up today. He did not bring his medications nor his medication list with him to the office today for review. His wife states that she feels his medications for him but she does not believe that he is  on metoprolol. She thinks she may still be taking amiodarone but she is not certain. The patient complains of anorexia and lower extremity edema. He states that he gets short of breath occasionally but his breathing is overall much better than it used to be.    Current Outpatient Prescriptions  Medication Sig Dispense Refill  . aspirin EC 81 MG tablet Take 81 mg by mouth daily. Reported on 12/29/2015    . finasteride (PROSCAR) 5 MG tablet TAKE ONE (1) TABLET EACH DAY 30 tablet 3  . fluticasone (FLONASE) 50 MCG/ACT nasal spray Place 1 spray into both nostrils daily as needed for allergies or rhinitis.    . furosemide (LASIX) 40 MG tablet Take 20 mg by mouth 2 (two) times daily.     . ondansetron (ZOFRAN) 4 MG tablet Take 1 tablet (4 mg total) by mouth every 8 (eight) hours as needed for nausea or vomiting. 20 tablet 0  . potassium chloride SA (K-DUR,KLOR-CON) 20 MEQ tablet Take 1 tablet (20 mEq total) by mouth daily. 30 tablet 1  . pravastatin (PRAVACHOL) 40 MG tablet Take 1 tablet (40 mg total) by mouth daily at 6 PM. 30 tablet 11  . tamsulosin (FLOMAX) 0.4 MG CAPS capsule Take 1 capsule (0.4 mg total) by mouth daily. 90 capsule 1  . warfarin (COUMADIN) 2 MG tablet Take 0.5 tablets (1 mg total) by mouth daily at 6 PM. Or as directed (Patient taking differently: Take 1 mg by mouth daily at 6 PM. 2 mg Monday- Friday.  1 mg all other days.) 30 tablet 1   No current facility-administered medications for this visit.      Physical Exam:   BP 130/86 mmHg  Pulse 88  Resp 16  Ht 5\' 10"  (1.778 m)  Wt 188 lb (85.276 kg)  BMI 26.98 kg/m2  SpO2 98%  General:  Elderly but well-appearing  Chest:   Clear to auscultation  CV:   Regular rate and rhythm without murmur  Incisions:  Well-healed, sternum is stable  Abdomen:  Soft and nontender  Extremities:  Warm and well-perfused with severe lower extremity edema  Diagnostic Tests:  n/a   Impression:  The patient appears clinically stable overall  approximately 3 months status post minimally invasive mitral valve repair. However, he complains that he remains anorexic and his wife states that he continues to lose weight. She reports that his appetite is intermittently fairly good and other times very poor. He has had problems with nausea. Neither the patient nor his wife are certain what medications he is currently taking at this time. He still has significant signs of fluid overload with chronic lower extremity edema. His lungs are clear on exam and I do not hear a murmur to suggest a recurrence or persistence of systolic anterior motion of the mitral valve.   Plan:  I have instructed the patient and his wife to make certain to bring all of his medications in a bag within every time he goes to a doctor appointment so that everyone caring for him can tell for certain what medications he is taking and make changes as appropriate. We have not recommended any changes at this time, although I certainly would support the idea that he should no longer be taking amiodarone. I would favor keeping him on a beta blocker if he will tolerate it because of concerns of the possibility developing systolic anterior motion of the mitral valve.  The patient has a follow-up appointment scheduled with Dr. Dennard Schaumann later this week and with Dr. Percival Spanish next week. He has been urged to keep both of those appointments and to bring all of his medications with him for review.  The patient will return to our office for routine follow-up next March, approximately 1 year following his original surgery. He will call and return sooner should specific problems or difficulties arise.   I spent in excess of 15 minutes during the conduct of this office consultation and >50% of this time involved direct face-to-face encounter with the patient for counseling and/or coordination of their care.    Valentina Gu. Roxy Manns, MD 01/22/2016 1:06 PM

## 2016-01-24 ENCOUNTER — Ambulatory Visit: Payer: Self-pay | Admitting: Neurology

## 2016-01-25 ENCOUNTER — Encounter: Payer: Self-pay | Admitting: Family Medicine

## 2016-01-25 ENCOUNTER — Ambulatory Visit (INDEPENDENT_AMBULATORY_CARE_PROVIDER_SITE_OTHER): Payer: Medicare Other | Admitting: Family Medicine

## 2016-01-25 VITALS — BP 110/72 | HR 80 | Temp 97.4°F | Resp 16 | Ht 67.0 in | Wt 191.0 lb

## 2016-01-25 DIAGNOSIS — I83029 Varicose veins of left lower extremity with ulcer of unspecified site: Secondary | ICD-10-CM | POA: Diagnosis not present

## 2016-01-25 DIAGNOSIS — E46 Unspecified protein-calorie malnutrition: Secondary | ICD-10-CM | POA: Diagnosis not present

## 2016-01-25 DIAGNOSIS — R42 Dizziness and giddiness: Secondary | ICD-10-CM | POA: Diagnosis not present

## 2016-01-25 DIAGNOSIS — R296 Repeated falls: Secondary | ICD-10-CM

## 2016-01-25 DIAGNOSIS — L97929 Non-pressure chronic ulcer of unspecified part of left lower leg with unspecified severity: Secondary | ICD-10-CM

## 2016-01-25 NOTE — Progress Notes (Signed)
Subjective:    Patient ID: [name], male    DOB: 02/04/1929, 82 y.o.   MRN: [id number]  HPI  12/18/15 Since I last saw the patient, he has been admitted to the hospital with "weakness". Patient is a difficult historian, however his symptoms sound like orthostatic dizziness and weakness and presyncope. Whenever he is walking or stands from a seated position, he will suddenly become lightheaded and have to slump to the floor to avoid falling. There have been episodes of seizure-like activity which were deemed to be due to cerebral hypoperfusion secondary to low blood pressure. Patient denies any loss of consciousness although he has had what sounds like two witnessed generalized tonic-clonic episodes secondary to cerebral hypoperfusion. Gabapentin was held in the hospital however he continues to have orthostatic dizziness. Of note, the patient has recently been found to have a subdural hematoma which could put him at higher risk for seizures.  This was discovered on a CT scan of the head in March as dictated below: 14 mm RIGHT holo hemispheric chronic subdural hematoma without midline shift. At that time, my plan was: This is a difficult case. He is not truly orthostatic today on exam although his history sounds very much like that. I believe we need to take steps to minimize orthostatic dizziness. First, discontinue Flomax as this can certainly can contribute. Based on the recent hospital discharge summary it sounds like the patient may have had an episode of bradycardia.  He is currently on metoprolol 12.5 mg by mouth twice a day. This is partly due to atrial fibrillation and partly due to his coronary artery disease. I'm going to temporarily discontinue the metoprolol to see if his symptoms will improve. I would have a low threshold to put the patient back on a beta blocker given his significant cardiovascular history but I will see if his symptoms will improve off of the beta blocker. I'm also  going to consult neurology for possible EEG to see if he could be experiencing seizure activity as well as drop attacks due to seizure activity particular given his subdural hematoma. Recheck in one week.  His weight also continues to drop and there is no evidence of peripheral edema or pulmonary edema on exam today so I'm going to decrease his Lasix to 20 mg by mouth twice a day in an effort to avoid cerebral hypoperfusion. Recheck in one week or immediately if worse   12/25/15 Patient states that he feels much better. He no longer feels weak and lightheaded. He denies any further orthostatic syncope or near syncope. He denies any further falls. He is starting to ambulate more without difficulty. His weight is unchanged. On examination today he has no pedal edema and his lungs are clear. His heart rate is within normal limits.  At that time, my plan was: t appears that the majority of his symptoms were due to weakness and orthostatic hypotension. At the present time I will make no changes in his medication regimen. I would like to see the patient back in one month. I do believe that as he gets stronger and recover from his recent open heart surgery and hospitalizations that we will likely need to increase his dose of Lasix and resume his metoprolol. I certainly believe that it will be for the best to be on metoprolol at the present time. However, patient is too weak to tolerate the medication. I will recheck him in one month and likely resume it at that time.  Should the patient develop a rapid heart rate we will resume an earlier. Should he retain fluid we will increase his dose of Lasix sooner. At the present time he denies any urinary urgency or hesitancy and we will continue to refrain from using Flomax  01/04/16 Since I last saw the patient, he saw his cardiologist who recommended discontinuing amiodarone and resuming a low-dose metoprolol. Patient is unsure if he is taking metoprolol. He has no medication  list with him. The medication list he brings is an old medication list. Also since last time I saw him he went to the emergency room with intractable nausea and vomiting. This has since subsided and now the patient states that he feels much better and that he is eating better. He does have trace to +1 edema in his right leg distal to the shin however there is no edema in his left leg and his weight is down approximately 4 pounds from his last office visit.  At that time, my plan was: His weakness and dizziness have improved since his blood pressure has improved. He has had no further syncopal episodes. His nausea and vomiting seem to been transient and have now resolved. He does have some trace edema in his right leg however I will not increase his diuretic at the present time as he is not symptomatic and he has lost weight due to poor by mouth intake. Recheck in one month and reassess diuretic use at that point  01/25/16 Wt Readings from Last 3 Encounters:  01/25/16 191 lb (86.637 kg)  01/22/16 188 lb (85.276 kg)  01/04/16 196 lb (88.905 kg)   He is here today for follow up.  He states that he is not eating. He has no appetite. He becomes nauseated very quickly. He also has a nonhealing small venous stasis ulcer on the anterior left shin that has been there for more than a month. It is 1.5 cm in diameter. He has trace pitting edema in both legs. He denies any chest pain or shortness of breath. Today he brings all his medications with him. He is not taking amiodarone. However he is also not taking metoprolol as recommended by his cardiologist. Past Medical History  Diagnosis Date  . Coronary artery disease     a. 11/2014 NSTEMI: DESx 2 placed to LAD and RCA; b. 08/2015 Cath: LAD patent stent, LCX 65m, RCA patent stent, 58m.  . Diabetes mellitus, type II ([place])     a. HgA1c 6.8 in 03/2015  . SVT (supraventricular tachycardia) ([facility])     a. recurrent, usually responsive to vagal manuevers  . Pulmonary HTN  ([place])   . Lower extremity edema     a. chronic  . HLD (hyperlipidemia)   . Hypertensive heart disease   . OA (osteoarthritis)   . ED (erectile dysfunction)   . Chronic fatigue   . Gout   . Spinal stenosis   . Prostate cancer ([place])   . Severe mitral regurgitation     a.  10/2015 s/p minimally invasive MV repair; b. 10/2015 Echo: mod MS, mean grad 33mmHg.  Marland Kitchen Chronic diastolic (congestive) heart failure ([place])     a. 10/2013 Echo: EF 55-60%, basal-mid anteroseptal and basal-mid inferoseptal HK.  Marland Kitchen Hemangioma of liver 08/02/2015    12 mm enhancing lesion seen on CT - suspicious for benign hemangioma but not diagnostic - MRI recommended  . Kidney stones   . Physical deconditioning   . PAF (paroxysmal atrial fibrillation) ([place])  a. 10/2015 s/p DCCV;  b. CHA2DS2VASc = 6--> on coumadin; c. 10/2015 Echo: sev dil LA.  . Orthostatic hypotension   . Tricuspid regurgitation     a. 10/2015 Echo: mild to mod TR.  Marland Kitchen Pulmonary hypertension ([place])     a. 10/2015 Echo: PASP 51mmHg.  Marland Kitchen Chronic subdural hematoma ([place])     a. 12/2015 Head CT: chronic right holo-hemispheric SDH w/o midline shift.   Past Surgical History  Procedure Laterality Date  . Cardiac surgery      2 cardiac stents  . Joint replacement      shoulder  . Back surgery x 2    . Rt rotator cuff repair    . Cervical spine surgery    . Back surgery    . Left heart catheterization with coronary angiogram N/A 11/16/2014    Procedure: LEFT HEART CATHETERIZATION WITH CORONARY ANGIOGRAM;  Surgeon: Troy Sine, MD;  Location: [facility] CATH LAB;  Service: Cardiovascular;  Laterality: N/A;  . Tee without cardioversion N/A 06/22/2015    Procedure: TRANSESOPHAGEAL ECHOCARDIOGRAM (TEE);  Surgeon: Skeet Latch, MD;  Location: [place];  Service: Cardiovascular;  Laterality: N/A;  . Cardiac catheterization N/A 08/08/2015    Procedure: Right/Left Heart Cath and Coronary Angiography;  Surgeon: Sherren Mocha, MD;  Location: [place] CV LAB;   Service: Cardiovascular;  Laterality: N/A;  . Eye surgery    . Mitral valve repair Right 10/04/2015    Procedure: MINIMALLY INVASIVE MITRAL VALVE REPAIR (MVR);  Surgeon: Rexene Alberts, MD;  Location: [place];  Service: Open Heart Surgery;  Laterality: Right;  . Tee without cardioversion N/A 10/04/2015    Procedure: TRANSESOPHAGEAL ECHOCARDIOGRAM (TEE);  Surgeon: Rexene Alberts, MD;  Location: [place];  Service: Open Heart Surgery;  Laterality: N/A;  . Wound exploration N/A 10/05/2015    Procedure: Sternal Incision;  Surgeon: Rexene Alberts, MD;  Location: [place];  Service: Open Heart Surgery;  Laterality: N/A;  . Intraoperative transesophageal echocardiogram  10/05/2015    Procedure: INTRAOPERATIVE TRANSESOPHAGEAL ECHOCARDIOGRAM;  Surgeon: Rexene Alberts, MD;  Location: [place];  Service: Open Heart Surgery;;  . Cardioversion N/A 10/20/2015    Procedure: CARDIOVERSION;  Surgeon: Pixie Casino, MD;  Location: [facility] ENDOSCOPY;  Service: Cardiovascular;  Laterality: N/A;   Current Outpatient Prescriptions on File Prior to Visit  Medication Sig Dispense Refill  . aspirin EC 81 MG tablet Take 81 mg by mouth daily. Reported on 12/29/2015    . finasteride (PROSCAR) 5 MG tablet TAKE ONE (1) TABLET EACH DAY 30 tablet 3  . fluticasone (FLONASE) 50 MCG/ACT nasal spray Place 1 spray into both nostrils daily as needed for allergies or rhinitis.    . furosemide (LASIX) 40 MG tablet Take 20 mg by mouth 2 (two) times daily.     . ondansetron (ZOFRAN) 4 MG tablet Take 1 tablet (4 mg total) by mouth every 8 (eight) hours as needed for nausea or vomiting. 20 tablet 0  . potassium chloride SA (K-DUR,KLOR-CON) 20 MEQ tablet Take 1 tablet (20 mEq total) by mouth daily. 30 tablet 1  . pravastatin (PRAVACHOL) 40 MG tablet Take 1 tablet (40 mg total) by mouth daily at 6 PM. 30 tablet 11  . tamsulosin (FLOMAX) 0.4 MG CAPS capsule Take 1 capsule (0.4 mg total) by mouth daily. 90 capsule 1  . warfarin (COUMADIN) 2 MG tablet Take 0.5  tablets (1 mg total) by mouth daily at 6 PM. Or as directed (Patient taking differently: Take 1  mg by mouth daily at 6 PM. 2 mg Monday- Friday. 1 mg all other days.) 30 tablet 1   No current facility-administered medications on file prior to visit.   No Known Allergies Social History   Social History  . Marital Status: Married    Spouse Name: N/A  . Number of Children: 4  . Years of Education: N/A   Occupational History  .     Social History Main Topics  . Smoking status: Former Research scientist (life sciences)  . Smokeless tobacco: Never Used     Comment: QUIT SMOKING  MANY YEARS AGO"  . Alcohol Use: No  . Drug Use: No  . Sexual Activity: Not on file   Other Topics Concern  . Not on file   Social History Narrative   ** Merged History Encounter **       Four adopted children.  Lives alone.      Review of Systems  All other systems reviewed and are negative.      Objective:   Physical Exam  Neck: Neck supple. No JVD present.  Cardiovascular: Normal rate and regular rhythm.   Murmur heard. Pulmonary/Chest: Effort normal. He has no wheezes. He has no rales.  Abdominal: Soft. Bowel sounds are normal.  Musculoskeletal: He exhibits edema.  Lymphadenopathy:    He has no cervical adenopathy.  Neurological: He has normal reflexes. No cranial nerve deficit. He exhibits normal muscle tone.  Vitals reviewed.         Assessment & Plan:  Venous stasis ulcer, ASCVD, protein calorie malnutrition.  I will treat the venous stasis ulcer by applying an Unna boot to the left leg. Recheck here on Monday to replace the [employer]. I believe we can control the edema, hopefully the ulcer will heal over the next 2-3 weeks. I would like to see him back in 2 weeks. I am out of town next week so my partner will rewrap his leg. Resume metoprolol 12.5 mg by mouth twice a day. Begin ensure 1 can 2-3 times a day.

## 2016-01-26 ENCOUNTER — Ambulatory Visit (INDEPENDENT_AMBULATORY_CARE_PROVIDER_SITE_OTHER): Payer: Self-pay | Admitting: Pharmacist Clinician (PhC)/ Clinical Pharmacy Specialist

## 2016-01-26 ENCOUNTER — Telehealth: Payer: Self-pay | Admitting: Cardiology

## 2016-01-26 ENCOUNTER — Telehealth: Payer: Self-pay | Admitting: Family Medicine

## 2016-01-26 DIAGNOSIS — I272 Other secondary pulmonary hypertension: Secondary | ICD-10-CM | POA: Diagnosis not present

## 2016-01-26 DIAGNOSIS — Z7982 Long term (current) use of aspirin: Secondary | ICD-10-CM | POA: Diagnosis not present

## 2016-01-26 DIAGNOSIS — Z7901 Long term (current) use of anticoagulants: Secondary | ICD-10-CM | POA: Diagnosis not present

## 2016-01-26 DIAGNOSIS — R110 Nausea: Secondary | ICD-10-CM | POA: Diagnosis not present

## 2016-01-26 DIAGNOSIS — I110 Hypertensive heart disease with heart failure: Secondary | ICD-10-CM | POA: Diagnosis not present

## 2016-01-26 DIAGNOSIS — Z87891 Personal history of nicotine dependence: Secondary | ICD-10-CM | POA: Diagnosis not present

## 2016-01-26 DIAGNOSIS — M150 Primary generalized (osteo)arthritis: Secondary | ICD-10-CM | POA: Diagnosis not present

## 2016-01-26 DIAGNOSIS — I2510 Atherosclerotic heart disease of native coronary artery without angina pectoris: Secondary | ICD-10-CM | POA: Diagnosis not present

## 2016-01-26 DIAGNOSIS — R627 Adult failure to thrive: Secondary | ICD-10-CM | POA: Diagnosis not present

## 2016-01-26 DIAGNOSIS — E119 Type 2 diabetes mellitus without complications: Secondary | ICD-10-CM | POA: Diagnosis not present

## 2016-01-26 DIAGNOSIS — I5032 Chronic diastolic (congestive) heart failure: Secondary | ICD-10-CM | POA: Diagnosis not present

## 2016-01-26 DIAGNOSIS — Z9889 Other specified postprocedural states: Secondary | ICD-10-CM

## 2016-01-26 DIAGNOSIS — Z8546 Personal history of malignant neoplasm of prostate: Secondary | ICD-10-CM | POA: Diagnosis not present

## 2016-01-26 DIAGNOSIS — E785 Hyperlipidemia, unspecified: Secondary | ICD-10-CM | POA: Diagnosis not present

## 2016-01-26 LAB — POCT INR: INR: 1.8

## 2016-01-26 NOTE — Telephone Encounter (Signed)
See anticoag note

## 2016-01-26 NOTE — Telephone Encounter (Signed)
New message       [facility] calling to give PT/INR results

## 2016-01-26 NOTE — Telephone Encounter (Signed)
[name] w/ [facility] is calling to request verbal orders to extend PT due to swelling in his legs. He would like orders to see him 2x a wk for 2 wks and 1x a wk for 1 wk. Please call [name] @ [phone]

## 2016-01-29 ENCOUNTER — Encounter: Payer: Self-pay | Admitting: Family Medicine

## 2016-01-29 ENCOUNTER — Ambulatory Visit (INDEPENDENT_AMBULATORY_CARE_PROVIDER_SITE_OTHER): Payer: Medicare Other | Admitting: Family Medicine

## 2016-01-29 VITALS — BP 138/72 | HR 74 | Temp 97.9°F | Resp 16 | Ht 67.0 in | Wt 194.0 lb

## 2016-01-29 DIAGNOSIS — I83029 Varicose veins of left lower extremity with ulcer of unspecified site: Secondary | ICD-10-CM | POA: Diagnosis not present

## 2016-01-29 DIAGNOSIS — R600 Localized edema: Secondary | ICD-10-CM

## 2016-01-29 DIAGNOSIS — I83019 Varicose veins of right lower extremity with ulcer of unspecified site: Secondary | ICD-10-CM | POA: Diagnosis not present

## 2016-01-29 DIAGNOSIS — IMO0001 Reserved for inherently not codable concepts without codable children: Secondary | ICD-10-CM

## 2016-01-29 MED ORDER — METOPROLOL TARTRATE 25 MG PO TABS: 12.5000 mg | ORAL_TABLET | Freq: Two times a day (BID) | ORAL | Status: DC

## 2016-01-29 NOTE — Patient Instructions (Signed)
F/U Friday for [employer]

## 2016-01-29 NOTE — Progress Notes (Signed)
Patient ID: [name], male   DOB: 10/14/1928, 82 y.o.   MRN: [id number]    Subjective:    Patient ID: [name], male    DOB: 10-30-1928, 82 y.o.   MRN: [id number]  Patient presents for F/U Patient here for follow up  Unna boot  was placed 5 days ago secondary to venous stasis ulcer and significant peripheral edema. He has significant past medical history and cardiac history with congestive heart failure and Mitral  valve replacement. He is currently on Lasix. He was also restarted on metoprolol at a lower dose 12.5 mg twice a day to avoid any hypotension and dizziness he states he has done well with this dose. He was also started on an short help with his protein intake as he has significant weight loss since his surgery earlier this year. No new concern today with the exception of his right leg is still quite swollen    Review Of Systems:  GEN- denies fatigue, fever, weight loss,weakness, recent illness HEENT- denies eye drainage, change in vision, nasal discharge, CVS- denies chest pain, palpitations RESP- denies SOB, cough, wheeze ABD- denies N/V, change in stools, abd pain GU- denies dysuria, hematuria, dribbling, incontinence MSK- denies joint pain, muscle aches, injury Neuro- denies headache, dizziness, syncope, seizure activity       Objective:    BP 138/72 mmHg  Pulse 74  Temp(Src) 97.9 F (36.6 C) (Oral)  Resp 16  Ht 5\' 7"  (1.702 m)  Wt 194 lb (87.998 kg)  BMI 30.38 kg/m2 GEN- NAD, alert and oriented x3 HEENT- PERRL, EOMI, non injected sclera, pink conjunctiva, MMM, oropharynx clear Neck- Supple, no thyromegaly CVS- RRR, systolic murmur  RESP-CTAB EXT- 1+ edema RLE, trace edema at toes and above previous unna boot site  Ulceration no 1x1cm area with scab over,no drainage  edema Pulses- Radial, DP- 2+        Assessment & Plan:      Problem List Items Addressed This Visit    Lower extremity edema    Continue lasix, chronic edema, unna boot to  RLE as well       Other Visit Diagnoses    Venous stasis ulcer, left ([place])    -  Primary    much improved with first unna boot, reapplied today, will also place on right LE to help with stasis edema- recheck Friday        Note: This dictation was prepared with Dragon dictation along with smaller phrase technology. Any transcriptional errors that result from this process are unintentional.

## 2016-01-29 NOTE — Assessment & Plan Note (Signed)
Continue lasix, chronic edema, unna boot to RLE as well

## 2016-01-30 ENCOUNTER — Telehealth: Payer: Self-pay | Admitting: *Deleted

## 2016-01-30 ENCOUNTER — Ambulatory Visit: Payer: Self-pay | Admitting: Neurology

## 2016-01-30 ENCOUNTER — Ambulatory Visit (INDEPENDENT_AMBULATORY_CARE_PROVIDER_SITE_OTHER): Payer: Medicare Other | Admitting: Cardiology

## 2016-01-30 ENCOUNTER — Encounter: Payer: Self-pay | Admitting: Cardiology

## 2016-01-30 ENCOUNTER — Ambulatory Visit (INDEPENDENT_AMBULATORY_CARE_PROVIDER_SITE_OTHER): Payer: Medicare Other | Admitting: Pharmacist

## 2016-01-30 VITALS — BP 120/76 | HR 60 | Ht 67.0 in | Wt 197.0 lb

## 2016-01-30 DIAGNOSIS — I480 Paroxysmal atrial fibrillation: Secondary | ICD-10-CM | POA: Diagnosis not present

## 2016-01-30 DIAGNOSIS — I340 Nonrheumatic mitral (valve) insufficiency: Secondary | ICD-10-CM | POA: Diagnosis not present

## 2016-01-30 DIAGNOSIS — I2510 Atherosclerotic heart disease of native coronary artery without angina pectoris: Secondary | ICD-10-CM | POA: Diagnosis not present

## 2016-01-30 DIAGNOSIS — Z7901 Long term (current) use of anticoagulants: Secondary | ICD-10-CM

## 2016-01-30 DIAGNOSIS — Z9889 Other specified postprocedural states: Secondary | ICD-10-CM

## 2016-01-30 LAB — POCT INR: INR: 1.7

## 2016-01-30 MED ORDER — WARFARIN SODIUM 2 MG PO TABS: 1.0000 mg | ORAL_TABLET | Freq: Every day | ORAL | Status: DC

## 2016-01-30 NOTE — Progress Notes (Signed)
HPI  The patient for followup of HeFPEF, CAD, narrow complex tachycardia MR and a host of other medical problems as listed below.  He is status post MV repair in 10/2015.  His immediate post op course was complicated by hypotension inducible SAM and MR.  This improved with medication adjustment including adding a beta blocker.  Post op he had atrial fib (requiring DCCV and amiodarone), acute on chronic diastolic HF, syncope with orthostasis.   He was last seen in the office on 5/23 by Ignacia Bayley, NP.  At that time his biggest complaint was nausea.  As there was no other clear cause his amiodarone was stopped.  I do note that he was in the ED for this problem a few days later.  I have reviewed this record and the office records.  Despite all of this he's doing exceptionally well. He is getting around a cane for the most part. He comes in today his legs are wrapped.  The swelling is much better. He did have a wound that is improved.  He reports that the nausea he was having this much improved and he's not having any diarrhea or constipation. His breathing is okay. He is working with physical therapy and has significant weakness.   No Known Allergies  Current Outpatient Prescriptions  Medication Sig Dispense Refill  . aspirin EC 81 MG tablet Take 81 mg by mouth every other day. Reported on 12/29/2015    . finasteride (PROSCAR) 5 MG tablet TAKE ONE (1) TABLET EACH DAY 30 tablet 3  . fluticasone (FLONASE) 50 MCG/ACT nasal spray Place 1 spray into both nostrils daily as needed for allergies or rhinitis.    . furosemide (LASIX) 40 MG tablet Take 20 mg by mouth 2 (two) times daily.     . metoprolol tartrate (LOPRESSOR) 25 MG tablet Take 0.5 tablets (12.5 mg total) by mouth 2 (two) times daily. 60 tablet 3  . ondansetron (ZOFRAN) 4 MG tablet Take 1 tablet (4 mg total) by mouth every 8 (eight) hours as needed for nausea or vomiting. 20 tablet 0  . potassium chloride SA (K-DUR,KLOR-CON) 20 MEQ tablet Take 1  tablet (20 mEq total) by mouth daily. 30 tablet 1  . pravastatin (PRAVACHOL) 40 MG tablet Take 1 tablet (40 mg total) by mouth daily at 6 PM. 30 tablet 11  . traMADol (ULTRAM) 50 MG tablet Take 50 mg by mouth every morning.     . warfarin (COUMADIN) 2 MG tablet Take 0.5 tablets (1 mg total) by mouth daily at 6 PM. Or as directed (Patient taking differently: Take 1 mg by mouth daily at 6 PM. 2 mg Monday- Friday. 1 mg all other days.) 30 tablet 1   No current facility-administered medications for this visit.    Past Medical History  Diagnosis Date  . Coronary artery disease     a. 11/2014 NSTEMI: DESx 2 placed to LAD and RCA; b. 08/2015 Cath: LAD patent stent, LCX 28m, RCA patent stent, 38m.  . Diabetes mellitus, type II ([place])     a. HgA1c 6.8 in 03/2015  . SVT (supraventricular tachycardia) (HCC)     a. recurrent, usually responsive to vagal manuevers  . Pulmonary HTN ([place])   . Lower extremity edema     a. chronic  . HLD (hyperlipidemia)   . Hypertensive heart disease   . OA (osteoarthritis)   . ED (erectile dysfunction)   . Chronic fatigue   . Gout   . Spinal stenosis   .  Prostate cancer ([place])   . Severe mitral regurgitation     a.  10/2015 s/p minimally invasive MV repair; b. 10/2015 Echo: mod MS, mean grad 73mmHg.  Marland Kitchen Chronic diastolic (congestive) heart failure ([place])     a. 10/2013 Echo: EF 55-60%, basal-mid anteroseptal and basal-mid inferoseptal HK.  Marland Kitchen Hemangioma of liver 08/02/2015    12 mm enhancing lesion seen on CT - suspicious for benign hemangioma but not diagnostic - MRI recommended  . Kidney stones   . Physical deconditioning   . PAF (paroxysmal atrial fibrillation) ([place])     a. 10/2015 s/p DCCV;  b. CHA2DS2VASc = 6--> on coumadin; c. 10/2015 Echo: sev dil LA.  . Orthostatic hypotension   . Tricuspid regurgitation     a. 10/2015 Echo: mild to mod TR.  Marland Kitchen Pulmonary hypertension ([place])     a. 10/2015 Echo: PASP 67mmHg.  Marland Kitchen Chronic subdural hematoma ([place])     a. 12/2015 Head CT:  chronic right holo-hemispheric SDH w/o midline shift.    Past Surgical History  Procedure Laterality Date  . Cardiac surgery      2 cardiac stents  . Joint replacement      shoulder  . Back surgery x 2    . Rt rotator cuff repair    . Cervical spine surgery    . Back surgery    . Left heart catheterization with coronary angiogram N/A 11/16/2014    Procedure: LEFT HEART CATHETERIZATION WITH CORONARY ANGIOGRAM;  Surgeon: Troy Sine, MD;  Location: [facility] CATH LAB;  Service: Cardiovascular;  Laterality: N/A;  . Tee without cardioversion N/A 06/22/2015    Procedure: TRANSESOPHAGEAL ECHOCARDIOGRAM (TEE);  Surgeon: Skeet Latch, MD;  Location: [place];  Service: Cardiovascular;  Laterality: N/A;  . Cardiac catheterization N/A 08/08/2015    Procedure: Right/Left Heart Cath and Coronary Angiography;  Surgeon: Sherren Mocha, MD;  Location: [place] CV LAB;  Service: Cardiovascular;  Laterality: N/A;  . Eye surgery    . Mitral valve repair Right 10/04/2015    Procedure: MINIMALLY INVASIVE MITRAL VALVE REPAIR (MVR);  Surgeon: Rexene Alberts, MD;  Location: [place];  Service: Open Heart Surgery;  Laterality: Right;  . Tee without cardioversion N/A 10/04/2015    Procedure: TRANSESOPHAGEAL ECHOCARDIOGRAM (TEE);  Surgeon: Rexene Alberts, MD;  Location: [place];  Service: Open Heart Surgery;  Laterality: N/A;  . Wound exploration N/A 10/05/2015    Procedure: Sternal Incision;  Surgeon: Rexene Alberts, MD;  Location: [place];  Service: Open Heart Surgery;  Laterality: N/A;  . Intraoperative transesophageal echocardiogram  10/05/2015    Procedure: INTRAOPERATIVE TRANSESOPHAGEAL ECHOCARDIOGRAM;  Surgeon: Rexene Alberts, MD;  Location: [place];  Service: Open Heart Surgery;;  . Cardioversion N/A 10/20/2015    Procedure: CARDIOVERSION;  Surgeon: Pixie Casino, MD;  Location: [facility] ENDOSCOPY;  Service: Cardiovascular;  Laterality: N/A;     ROS: Some difficulty swallowing, leg swelling. Otherwise as stated in  the HPI and negative for all other systems.  PHYSICAL EXAM BP 120/76 mmHg  Pulse 60  Ht 5\' 7"  (1.702 m)  Wt 197 lb (89.359 kg)  BMI 30.85 kg/m2  SpO2 98% GEN:  No distress, somewhat frail NECK:  No jugular venous distention at 90 degrees, waveform within normal limits, carotid upstroke brisk and symmetric, no bruits, no thyromegaly LYMPHATICS:  No cervical adenopathy LUNGS:  Clear to auscultation bilaterally BACK:  No CVA tenderness CHEST:  Unremarkable HEART:  S1 and S2 within normal limits, no S3, no  S4, no clicks, no rubs, no murmurs ABD:  Positive bowel sounds normal in frequency in pitch, no bruits, no rebound, no guarding, unable to assess midline mass or bruit with the patient seated. EXT:  2 plus pulses throughout, no edema, no cyanosis no clubbing, legs wrapped   Lab Results  Component Value Date   CHOL 158 06/05/2015   TRIG 235* 06/05/2015   HDL 31* 06/05/2015   [place] 80 06/05/2015     ASSESSMENT AND PLAN  Mitral valve repair:   He is doing very well.  No change in therapy is planned.  He will continue with the meds as listed.  Atrial fib:    [name] has a CHA2DS2 - VASc score of 5 with a risk of stroke of 6.7%.    He tolerates anticoagulation.  He will continue with this.    Nausea:  This is improved off of amiodarone.  No change in therapy is planned.   SVT-  He has had no recurrent symptoms.  No change in therapy is planned.  HFpEF/severe LVH/mod MR/pHTN (RVSP 15mmHg) -  He seems to be euvolemic. He will continue the meds as listed.    CAD-   He has no symptoms.  No change in therapy is planned.    DM- I will defer to [place], MD  Possibly hypothyroidism-   Last TSH was 1.264 in May.  No change in therapy.  HLD- LDL 80. Goal <70 with CAD. We placed him on statin.  He will continue with current statin.

## 2016-01-30 NOTE — Telephone Encounter (Signed)
Called [place] back and orders given

## 2016-01-30 NOTE — Patient Instructions (Signed)
Your physician wants you to follow-up in: 6 Months. You will receive a reminder letter in the mail two months in advance. If you don't receive a letter, please call our office to schedule the follow-up appointment.   If you need a refill on your cardiac medications before your next appointment, please call your pharmacy.

## 2016-01-30 NOTE — Telephone Encounter (Signed)
Called after new patient appointment time - stated he could not find the office - lost.

## 2016-01-31 ENCOUNTER — Encounter: Payer: Self-pay | Admitting: Neurology

## 2016-02-01 ENCOUNTER — Other Ambulatory Visit: Payer: Self-pay | Admitting: Pharmacist

## 2016-02-01 MED ORDER — WARFARIN SODIUM 2 MG PO TABS: ORAL_TABLET | ORAL | Status: DC

## 2016-02-02 ENCOUNTER — Ambulatory Visit (INDEPENDENT_AMBULATORY_CARE_PROVIDER_SITE_OTHER): Payer: Medicare Other | Admitting: Family Medicine

## 2016-02-02 VITALS — BP 118/56 | HR 80 | Temp 98.7°F | Resp 22 | Ht 67.0 in | Wt 191.0 lb

## 2016-02-02 DIAGNOSIS — R600 Localized edema: Secondary | ICD-10-CM | POA: Diagnosis not present

## 2016-02-02 DIAGNOSIS — IMO0001 Reserved for inherently not codable concepts without codable children: Secondary | ICD-10-CM

## 2016-02-02 DIAGNOSIS — I83029 Varicose veins of left lower extremity with ulcer of unspecified site: Secondary | ICD-10-CM | POA: Diagnosis not present

## 2016-02-02 NOTE — Patient Instructions (Addendum)
F/U 1 week Dr. Wilmon Pali your feet

## 2016-02-04 ENCOUNTER — Encounter: Payer: Self-pay | Admitting: Family Medicine

## 2016-02-04 NOTE — Progress Notes (Signed)
Patient ID: [name], male   DOB: [birth year], 82 y.o.   MRN: [id number]   Subjective:    Patient ID: [name], male    DOB: Sep 22, 1928, 81 y.o.   MRN: [id number]  Patient presents for Venous Stasis Ulcer  Pt here for Unna boot removal. No concerns  Left leg ulcer much improved, healing well  Right leg- signigicant fluid reduction, weight down 3lbs  Reviewed cardiology note from this week as well     Review Of Systems:  GEN- denies fatigue, fever, weight loss,weakness, recent illness CVS- denies chest pain, palpitations RESP- denies SOB, cough, wheeze MSK- denies joint pain, muscle aches, injury       Objective:    BP 118/56 mmHg  Pulse 80  Temp(Src) 98.7 F (37.1 C) (Oral)  Resp 22  Ht 5\' 7"  (1.702 m)  Wt 191 lb (86.637 kg)  BMI 29.91 kg/m2 GEN- NAD, alert and oriented x3 Ext- right LE-significant decrease in edema, still has pedal edema, LLE- ulceration with scab at center , no significant erythema, no drainage, NT, mild swelling at foot ( edge of unna boot)          Assessment & Plan:      Problem List Items Addressed This Visit    Lower extremity edema    Other Visit Diagnoses    Venous stasis ulcers, left ([place])    -  Primary    much improved, replaced tegaderm only today. Recheck next week see how he does without the unna boots. Continue diuretics per cards, Right leg also much improve       Note: This dictation was prepared with Dragon dictation along with smaller phrase technology. Any transcriptional errors that result from this process are unintentional.

## 2016-02-06 DIAGNOSIS — Z7982 Long term (current) use of aspirin: Secondary | ICD-10-CM | POA: Diagnosis not present

## 2016-02-06 DIAGNOSIS — E785 Hyperlipidemia, unspecified: Secondary | ICD-10-CM | POA: Diagnosis not present

## 2016-02-06 DIAGNOSIS — M150 Primary generalized (osteo)arthritis: Secondary | ICD-10-CM | POA: Diagnosis not present

## 2016-02-06 DIAGNOSIS — Z8546 Personal history of malignant neoplasm of prostate: Secondary | ICD-10-CM | POA: Diagnosis not present

## 2016-02-06 DIAGNOSIS — Z87891 Personal history of nicotine dependence: Secondary | ICD-10-CM | POA: Diagnosis not present

## 2016-02-06 DIAGNOSIS — E119 Type 2 diabetes mellitus without complications: Secondary | ICD-10-CM | POA: Diagnosis not present

## 2016-02-06 DIAGNOSIS — Z7901 Long term (current) use of anticoagulants: Secondary | ICD-10-CM | POA: Diagnosis not present

## 2016-02-06 DIAGNOSIS — R627 Adult failure to thrive: Secondary | ICD-10-CM | POA: Diagnosis not present

## 2016-02-06 DIAGNOSIS — I5032 Chronic diastolic (congestive) heart failure: Secondary | ICD-10-CM | POA: Diagnosis not present

## 2016-02-06 DIAGNOSIS — I272 Other secondary pulmonary hypertension: Secondary | ICD-10-CM | POA: Diagnosis not present

## 2016-02-06 DIAGNOSIS — I2510 Atherosclerotic heart disease of native coronary artery without angina pectoris: Secondary | ICD-10-CM | POA: Diagnosis not present

## 2016-02-06 DIAGNOSIS — R110 Nausea: Secondary | ICD-10-CM | POA: Diagnosis not present

## 2016-02-06 DIAGNOSIS — I110 Hypertensive heart disease with heart failure: Secondary | ICD-10-CM | POA: Diagnosis not present

## 2016-02-09 ENCOUNTER — Ambulatory Visit (INDEPENDENT_AMBULATORY_CARE_PROVIDER_SITE_OTHER): Payer: Medicare Other | Admitting: Family Medicine

## 2016-02-09 ENCOUNTER — Encounter: Payer: Self-pay | Admitting: Family Medicine

## 2016-02-09 VITALS — BP 118/60 | HR 76 | Temp 98.4°F | Resp 16 | Ht 67.0 in | Wt 190.0 lb

## 2016-02-09 DIAGNOSIS — M150 Primary generalized (osteo)arthritis: Secondary | ICD-10-CM | POA: Diagnosis not present

## 2016-02-09 DIAGNOSIS — I272 Other secondary pulmonary hypertension: Secondary | ICD-10-CM | POA: Diagnosis not present

## 2016-02-09 DIAGNOSIS — I83023 Varicose veins of left lower extremity with ulcer of ankle: Secondary | ICD-10-CM | POA: Diagnosis not present

## 2016-02-09 DIAGNOSIS — R627 Adult failure to thrive: Secondary | ICD-10-CM | POA: Diagnosis not present

## 2016-02-09 DIAGNOSIS — I2510 Atherosclerotic heart disease of native coronary artery without angina pectoris: Secondary | ICD-10-CM | POA: Diagnosis not present

## 2016-02-09 DIAGNOSIS — I110 Hypertensive heart disease with heart failure: Secondary | ICD-10-CM | POA: Diagnosis not present

## 2016-02-09 DIAGNOSIS — Z7982 Long term (current) use of aspirin: Secondary | ICD-10-CM | POA: Diagnosis not present

## 2016-02-09 DIAGNOSIS — Z7901 Long term (current) use of anticoagulants: Secondary | ICD-10-CM | POA: Diagnosis not present

## 2016-02-09 DIAGNOSIS — Z87891 Personal history of nicotine dependence: Secondary | ICD-10-CM | POA: Diagnosis not present

## 2016-02-09 DIAGNOSIS — E119 Type 2 diabetes mellitus without complications: Secondary | ICD-10-CM | POA: Diagnosis not present

## 2016-02-09 DIAGNOSIS — E785 Hyperlipidemia, unspecified: Secondary | ICD-10-CM | POA: Diagnosis not present

## 2016-02-09 DIAGNOSIS — I5032 Chronic diastolic (congestive) heart failure: Secondary | ICD-10-CM | POA: Diagnosis not present

## 2016-02-09 DIAGNOSIS — L97329 Non-pressure chronic ulcer of left ankle with unspecified severity: Principal | ICD-10-CM

## 2016-02-09 DIAGNOSIS — R110 Nausea: Secondary | ICD-10-CM | POA: Diagnosis not present

## 2016-02-09 NOTE — Progress Notes (Signed)
Subjective:    Patient ID: [name], male    DOB: October 17, 1928, 82 y.o.   MRN: [id number]  HPI  12/18/15 Since I last saw the patient, he has been admitted to the hospital with "weakness". Patient is a difficult historian, however his symptoms sound like orthostatic dizziness and weakness and presyncope. Whenever he is walking or stands from a seated position, he will suddenly become lightheaded and have to slump to the floor to avoid falling. There have been episodes of seizure-like activity which were deemed to be due to cerebral hypoperfusion secondary to low blood pressure. Patient denies any loss of consciousness although he has had what sounds like two witnessed generalized tonic-clonic episodes secondary to cerebral hypoperfusion. Gabapentin was held in the hospital however he continues to have orthostatic dizziness. Of note, the patient has recently been found to have a subdural hematoma which could put him at higher risk for seizures.  This was discovered on a CT scan of the head in March as dictated below: 14 mm RIGHT holo hemispheric chronic subdural hematoma without midline shift. At that time, my plan was: This is a difficult case. He is not truly orthostatic today on exam although his history sounds very much like that. I believe we need to take steps to minimize orthostatic dizziness. First, discontinue Flomax as this can certainly can contribute. Based on the recent hospital discharge summary it sounds like the patient may have had an episode of bradycardia.  He is currently on metoprolol 12.5 mg by mouth twice a day. This is partly due to atrial fibrillation and partly due to his coronary artery disease. I'm going to temporarily discontinue the metoprolol to see if his symptoms will improve. I would have a low threshold to put the patient back on a beta blocker given his significant cardiovascular history but I will see if his symptoms will improve off of the beta blocker. I'm also  going to consult neurology for possible EEG to see if he could be experiencing seizure activity as well as drop attacks due to seizure activity particular given his subdural hematoma. Recheck in one week.  His weight also continues to drop and there is no evidence of peripheral edema or pulmonary edema on exam today so I'm going to decrease his Lasix to 20 mg by mouth twice a day in an effort to avoid cerebral hypoperfusion. Recheck in one week or immediately if worse   12/25/15 Patient states that he feels much better. He no longer feels weak and lightheaded. He denies any further orthostatic syncope or near syncope. He denies any further falls. He is starting to ambulate more without difficulty. His weight is unchanged. On examination today he has no pedal edema and his lungs are clear. His heart rate is within normal limits.  At that time, my plan was: t appears that the majority of his symptoms were due to weakness and orthostatic hypotension. At the present time I will make no changes in his medication regimen. I would like to see the patient back in one month. I do believe that as he gets stronger and recover from his recent open heart surgery and hospitalizations that we will likely need to increase his dose of Lasix and resume his metoprolol. I certainly believe that it will be for the best to be on metoprolol at the present time. However, patient is too weak to tolerate the medication. I will recheck him in one month and likely resume it at that time.  Should the patient develop a rapid heart rate we will resume an earlier. Should he retain fluid we will increase his dose of Lasix sooner. At the present time he denies any urinary urgency or hesitancy and we will continue to refrain from using Flomax  01/04/16 Since I last saw the patient, he saw his cardiologist who recommended discontinuing amiodarone and resuming a low-dose metoprolol. Patient is unsure if he is taking metoprolol. He has no medication  list with him. The medication list he brings is an old medication list. Also since last time I saw him he went to the emergency room with intractable nausea and vomiting. This has since subsided and now the patient states that he feels much better and that he is eating better. He does have trace to +1 edema in his right leg distal to the shin however there is no edema in his left leg and his weight is down approximately 4 pounds from his last office visit.  At that time, my plan was: His weakness and dizziness have improved since his blood pressure has improved. He has had no further syncopal episodes. His nausea and vomiting seem to been transient and have now resolved. He does have some trace edema in his right leg however I will not increase his diuretic at the present time as he is not symptomatic and he has lost weight due to poor by mouth intake. Recheck in one month and reassess diuretic use at that point  01/25/16 Wt Readings from Last 3 Encounters:  02/09/16 190 lb (86.183 kg)  02/02/16 191 lb (86.637 kg)  01/30/16 197 lb (89.359 kg)   He is here today for follow up.  He states that he is not eating. He has no appetite. He becomes nauseated very quickly. He also has a nonhealing small venous stasis ulcer on the anterior left shin that has been there for more than a month. It is 1.5 cm in diameter. He has trace pitting edema in both legs. He denies any chest pain or shortness of breath. Today he brings all his medications with him. He is not taking amiodarone. However he is also not taking metoprolol as recommended by his cardiologist.  At that time, my plan was: Venous stasis ulcer, ASCVD, protein calorie malnutrition.  I will treat the venous stasis ulcer by applying an Unna boot to the left leg. Recheck here on Monday to replace the [employer]. I believe we can control the edema, hopefully the ulcer will heal over the next 2-3 weeks. I would like to see him back in 2 weeks. I am out of town next  week so my partner will rewrap his leg. Resume metoprolol 12.5 mg by mouth twice a day. Begin ensure 1 can 2-3 times a day.  02/09/16 Wound on his left shin has almost essentially completely healed. He states that his nausea has improved. Continues to have a poor appetite but his weight is stable. He feels that his appetite is slowly improving. His last lab work was in May and was significant for mild elevations in his liver function test. Marya Amsler due to follow-up on this. Past Medical History  Diagnosis Date  . Coronary artery disease     a. 11/2014 NSTEMI: DESx 2 placed to LAD and RCA; b. 08/2015 Cath: LAD patent stent, LCX 50m, RCA patent stent, 81m.  . Diabetes mellitus, type II ([place])     a. HgA1c 6.8 in 03/2015  . SVT (supraventricular tachycardia) ([place])  a. recurrent, usually responsive to vagal manuevers  . Pulmonary HTN ([place])   . Lower extremity edema     a. chronic  . HLD (hyperlipidemia)   . Hypertensive heart disease   . OA (osteoarthritis)   . ED (erectile dysfunction)   . Chronic fatigue   . Gout   . Spinal stenosis   . Prostate cancer ([place])   . Severe mitral regurgitation     a.  10/2015 s/p minimally invasive MV repair; b. 10/2015 Echo: mod MS, mean grad 44mmHg.  Marland Kitchen Chronic diastolic (congestive) heart failure ([place])     a. 10/2013 Echo: EF 55-60%, basal-mid anteroseptal and basal-mid inferoseptal HK.  Marland Kitchen Hemangioma of liver 08/02/2015    12 mm enhancing lesion seen on CT - suspicious for benign hemangioma but not diagnostic - MRI recommended  . Kidney stones   . Physical deconditioning   . PAF (paroxysmal atrial fibrillation) ([place])     a. 10/2015 s/p DCCV;  b. CHA2DS2VASc = 6--> on coumadin; c. 10/2015 Echo: sev dil LA.  . Orthostatic hypotension   . Tricuspid regurgitation     a. 10/2015 Echo: mild to mod TR.  Marland Kitchen Pulmonary hypertension ([place])     a. 10/2015 Echo: PASP 53mmHg.  Marland Kitchen Chronic subdural hematoma ([place])     a. 12/2015 Head CT: chronic right holo-hemispheric SDH w/o  midline shift.   Past Surgical History  Procedure Laterality Date  . Cardiac surgery      2 cardiac stents  . Joint replacement      shoulder  . Back surgery x 2    . Rt rotator cuff repair    . Cervical spine surgery    . Back surgery    . Left heart catheterization with coronary angiogram N/A 11/16/2014    Procedure: LEFT HEART CATHETERIZATION WITH CORONARY ANGIOGRAM;  Surgeon: Troy Sine, MD;  [facility] CATH LAB;  Service: Cardiovascular;  Laterality: N/A;  . Tee without cardioversion N/A 06/22/2015    Procedure: TRANSESOPHAGEAL ECHOCARDIOGRAM (TEE);  Surgeon: Skeet Latch, MD;  Location: [place];  Service: Cardiovascular;  Laterality: N/A;  . Cardiac catheterization N/A 08/08/2015    Procedure: Right/Left Heart Cath and Coronary Angiography;  Surgeon: Sherren Mocha, MD;  Location: [place] CV LAB;  Service: Cardiovascular;  Laterality: N/A;  . Eye surgery    . Mitral valve repair Right 10/04/2015    Procedure: MINIMALLY INVASIVE MITRAL VALVE REPAIR (MVR);  Surgeon: Rexene Alberts, MD;  Location: [place];  Service: Open Heart Surgery;  Laterality: Right;  . Tee without cardioversion N/A 10/04/2015    Procedure: TRANSESOPHAGEAL ECHOCARDIOGRAM (TEE);  Surgeon: Rexene Alberts, MD;  Location: [place];  Service: Open Heart Surgery;  Laterality: N/A;  . Wound exploration N/A 10/05/2015    Procedure: Sternal Incision;  Surgeon: Rexene Alberts, MD;  Location: [place];  Service: Open Heart Surgery;  Laterality: N/A;  . Intraoperative transesophageal echocardiogram  10/05/2015    Procedure: INTRAOPERATIVE TRANSESOPHAGEAL ECHOCARDIOGRAM;  Surgeon: Rexene Alberts, MD;  Location: [place];  Service: Open Heart Surgery;;  . Cardioversion N/A 10/20/2015    Procedure: CARDIOVERSION;  Surgeon: Pixie Casino, MD;  Location: [facility] ENDOSCOPY;  Service: Cardiovascular;  Laterality: N/A;   Current Outpatient Prescriptions on File Prior to Visit  Medication Sig Dispense Refill  . aspirin EC 81 MG  tablet Take 81 mg by mouth every other day. Reported on 12/29/2015    . finasteride (PROSCAR) 5 MG tablet TAKE ONE (1) TABLET EACH  DAY 30 tablet 3  . fluticasone (FLONASE) 50 MCG/ACT nasal spray Place 1 spray into both nostrils daily as needed for allergies or rhinitis.    . furosemide (LASIX) 40 MG tablet Take 20 mg by mouth 2 (two) times daily.     . metoprolol tartrate (LOPRESSOR) 25 MG tablet Take 0.5 tablets (12.5 mg total) by mouth 2 (two) times daily. 60 tablet 3  . ondansetron (ZOFRAN) 4 MG tablet Take 1 tablet (4 mg total) by mouth every 8 (eight) hours as needed for nausea or vomiting. 20 tablet 0  . potassium chloride SA (K-DUR,KLOR-CON) 20 MEQ tablet Take 1 tablet (20 mEq total) by mouth daily. 30 tablet 1  . pravastatin (PRAVACHOL) 40 MG tablet Take 1 tablet (40 mg total) by mouth daily at 6 PM. 30 tablet 11  . traMADol (ULTRAM) 50 MG tablet Take 50 mg by mouth every morning.     . warfarin (COUMADIN) 2 MG tablet Take 1-2 tablets daily or as directed by coumadin clinic 45 tablet 1   No current facility-administered medications on file prior to visit.   No Known Allergies Social History   Social History  . Marital Status: Married    Spouse Name: N/A  . Number of Children: 4  . Years of Education: N/A   Occupational History  .     Social History Main Topics  . Smoking status: Former Research scientist (life sciences)  . Smokeless tobacco: Never Used     Comment: QUIT SMOKING  MANY YEARS AGO"  . Alcohol Use: No  . Drug Use: No  . Sexual Activity: Not on file   Other Topics Concern  . Not on file   Social History Narrative   ** Merged History Encounter **       Four adopted children.  Lives alone.      Review of Systems  All other systems reviewed and are negative.      Objective:   Physical Exam  Neck: Neck supple. No JVD present.  Cardiovascular: Normal rate and regular rhythm.   Murmur heard. Pulmonary/Chest: Effort normal. He has no wheezes. He has no rales.  Abdominal: Soft.  Bowel sounds are normal.  Musculoskeletal: He exhibits edema.  Lymphadenopathy:    He has no cervical adenopathy.  Neurological: He has normal reflexes. No cranial nerve deficit. He exhibits normal muscle tone.  Vitals reviewed.         Assessment & Plan:  Venous stasis ulcer of ankle, left (HCC) - Plan: CBC with Differential/Platelet, COMPLETE METABOLIC PANEL WITH GFR  Wound has essentially healed. I will cover her with a Tegaderm. Should be completely healed by next week. Repeat CBC and CMP while here to follow-up transient elevation in liver function test. Continue to monitor his weight however his appetite is slowly improving, his weight is stable and his nausea has subsided per his report

## 2016-02-10 LAB — CBC WITH DIFFERENTIAL/PLATELET
BASOS PCT: 1 %
Basophils Absolute: 81 cells/uL (ref 0–200)
EOS ABS: 81 {cells}/uL (ref 15–500)
Eosinophils Relative: 1 %
HEMATOCRIT: 43.4 % (ref 38.5–50.0)
Hemoglobin: 13.8 g/dL (ref 13.0–17.0)
Lymphocytes Relative: 36 %
Lymphs Abs: 2916 cells/uL (ref 850–3900)
MCH: 28.5 pg (ref 27.0–33.0)
MCHC: 31.8 g/dL — ABNORMAL LOW (ref 32.0–36.0)
MCV: 89.7 fL (ref 80.0–100.0)
MONO ABS: 486 {cells}/uL (ref 200–950)
MPV: 10.1 fL (ref 7.5–12.5)
Monocytes Relative: 6 %
NEUTROS ABS: 4536 {cells}/uL (ref 1500–7800)
Neutrophils Relative %: 56 %
PLATELETS: 160 10*3/uL (ref 140–400)
RBC: 4.84 MIL/uL (ref 4.20–5.80)
RDW: 15.9 % — ABNORMAL HIGH (ref 11.0–15.0)
WBC: 8.1 10*3/uL (ref 3.8–10.8)

## 2016-02-10 LAB — COMPLETE METABOLIC PANEL WITH GFR
ALT: 15 U/L (ref 9–46)
AST: 31 U/L (ref 10–35)
Albumin: 3.1 g/dL — ABNORMAL LOW (ref 3.6–5.1)
Alkaline Phosphatase: 104 U/L (ref 40–115)
BILIRUBIN TOTAL: 0.8 mg/dL (ref 0.2–1.2)
BUN: 13 mg/dL (ref 7–25)
CHLORIDE: 101 mmol/L (ref 98–110)
CO2: 28 mmol/L (ref 20–31)
CREATININE: 1.32 mg/dL — AB (ref 0.70–1.11)
Calcium: 9 mg/dL (ref 8.6–10.3)
GFR, EST AFRICAN AMERICAN: 57 mL/min — AB (ref >=60)
GFR, Est Non African American: 49 mL/min — ABNORMAL LOW (ref >=60)
Glucose, Bld: 116 mg/dL — ABNORMAL HIGH (ref 70–99)
Potassium: 4.3 mmol/L (ref 3.5–5.3)
Sodium: 139 mmol/L (ref 135–146)
TOTAL PROTEIN: 6.3 g/dL (ref 6.1–8.1)

## 2016-02-13 DIAGNOSIS — R110 Nausea: Secondary | ICD-10-CM | POA: Diagnosis not present

## 2016-02-13 DIAGNOSIS — M150 Primary generalized (osteo)arthritis: Secondary | ICD-10-CM | POA: Diagnosis not present

## 2016-02-13 DIAGNOSIS — E119 Type 2 diabetes mellitus without complications: Secondary | ICD-10-CM | POA: Diagnosis not present

## 2016-02-13 DIAGNOSIS — Z7982 Long term (current) use of aspirin: Secondary | ICD-10-CM | POA: Diagnosis not present

## 2016-02-13 DIAGNOSIS — E785 Hyperlipidemia, unspecified: Secondary | ICD-10-CM | POA: Diagnosis not present

## 2016-02-13 DIAGNOSIS — I2510 Atherosclerotic heart disease of native coronary artery without angina pectoris: Secondary | ICD-10-CM | POA: Diagnosis not present

## 2016-02-13 DIAGNOSIS — Z7901 Long term (current) use of anticoagulants: Secondary | ICD-10-CM | POA: Diagnosis not present

## 2016-02-13 DIAGNOSIS — I272 Other secondary pulmonary hypertension: Secondary | ICD-10-CM | POA: Diagnosis not present

## 2016-02-13 DIAGNOSIS — I5032 Chronic diastolic (congestive) heart failure: Secondary | ICD-10-CM | POA: Diagnosis not present

## 2016-02-13 DIAGNOSIS — Z87891 Personal history of nicotine dependence: Secondary | ICD-10-CM | POA: Diagnosis not present

## 2016-02-13 DIAGNOSIS — R627 Adult failure to thrive: Secondary | ICD-10-CM | POA: Diagnosis not present

## 2016-02-13 DIAGNOSIS — I110 Hypertensive heart disease with heart failure: Secondary | ICD-10-CM | POA: Diagnosis not present

## 2016-02-14 ENCOUNTER — Ambulatory Visit (INDEPENDENT_AMBULATORY_CARE_PROVIDER_SITE_OTHER): Payer: Medicare Other | Admitting: Cardiology

## 2016-02-14 DIAGNOSIS — Z7901 Long term (current) use of anticoagulants: Secondary | ICD-10-CM

## 2016-02-14 DIAGNOSIS — Z9889 Other specified postprocedural states: Secondary | ICD-10-CM

## 2016-02-14 LAB — POCT INR: INR: 1.5

## 2016-02-16 DIAGNOSIS — E785 Hyperlipidemia, unspecified: Secondary | ICD-10-CM | POA: Diagnosis not present

## 2016-02-16 DIAGNOSIS — Z7901 Long term (current) use of anticoagulants: Secondary | ICD-10-CM | POA: Diagnosis not present

## 2016-02-16 DIAGNOSIS — I2510 Atherosclerotic heart disease of native coronary artery without angina pectoris: Secondary | ICD-10-CM | POA: Diagnosis not present

## 2016-02-16 DIAGNOSIS — Z7982 Long term (current) use of aspirin: Secondary | ICD-10-CM | POA: Diagnosis not present

## 2016-02-16 DIAGNOSIS — I5032 Chronic diastolic (congestive) heart failure: Secondary | ICD-10-CM | POA: Diagnosis not present

## 2016-02-16 DIAGNOSIS — M150 Primary generalized (osteo)arthritis: Secondary | ICD-10-CM | POA: Diagnosis not present

## 2016-02-16 DIAGNOSIS — I272 Other secondary pulmonary hypertension: Secondary | ICD-10-CM | POA: Diagnosis not present

## 2016-02-16 DIAGNOSIS — E119 Type 2 diabetes mellitus without complications: Secondary | ICD-10-CM | POA: Diagnosis not present

## 2016-02-16 DIAGNOSIS — R110 Nausea: Secondary | ICD-10-CM | POA: Diagnosis not present

## 2016-02-16 DIAGNOSIS — I110 Hypertensive heart disease with heart failure: Secondary | ICD-10-CM | POA: Diagnosis not present

## 2016-02-16 DIAGNOSIS — R627 Adult failure to thrive: Secondary | ICD-10-CM | POA: Diagnosis not present

## 2016-02-16 DIAGNOSIS — Z87891 Personal history of nicotine dependence: Secondary | ICD-10-CM | POA: Diagnosis not present

## 2016-02-21 ENCOUNTER — Ambulatory Visit (INDEPENDENT_AMBULATORY_CARE_PROVIDER_SITE_OTHER): Payer: Medicare Other

## 2016-02-21 DIAGNOSIS — Z9889 Other specified postprocedural states: Secondary | ICD-10-CM | POA: Diagnosis not present

## 2016-02-21 DIAGNOSIS — Z7901 Long term (current) use of anticoagulants: Secondary | ICD-10-CM | POA: Diagnosis not present

## 2016-02-21 LAB — POCT INR: INR: 1.7

## 2016-02-26 ENCOUNTER — Ambulatory Visit (INDEPENDENT_AMBULATORY_CARE_PROVIDER_SITE_OTHER): Payer: Medicare Other | Admitting: Family Medicine

## 2016-02-26 ENCOUNTER — Encounter: Payer: Self-pay | Admitting: Family Medicine

## 2016-02-26 VITALS — BP 120/62 | HR 60 | Temp 97.6°F | Resp 14 | Ht 67.0 in | Wt 188.0 lb

## 2016-02-26 DIAGNOSIS — S81801A Unspecified open wound, right lower leg, initial encounter: Secondary | ICD-10-CM | POA: Diagnosis not present

## 2016-02-26 DIAGNOSIS — S81811A Laceration without foreign body, right lower leg, initial encounter: Secondary | ICD-10-CM

## 2016-02-26 NOTE — Progress Notes (Signed)
Subjective:    Patient ID: [name], male    DOB: December 03, 1928, 82 y.o.   MRN: [id number]  HPI  Patient has a wound on the anterior surface of his right shin. It is approximately 2 cm x 1 cm. Is a very superficial skin tear. He has +1 pitting edema to his mid shin. The wound is not weeping. There is no evidence of cellulitis. He believes he hit his leg on something earlier today Past Medical History:  Diagnosis Date  . Chronic diastolic (congestive) heart failure ([place])    a. 10/2013 Echo: EF 55-60%, basal-mid anteroseptal and basal-mid inferoseptal HK.  . Chronic fatigue   . Chronic subdural hematoma ([place])    a. 12/2015 Head CT: chronic right holo-hemispheric SDH w/o midline shift.  . Coronary artery disease    a. 11/2014 NSTEMI: DESx 2 placed to LAD and RCA; b. 08/2015 Cath: LAD patent stent, LCX 31m, RCA patent stent, 100m.  . Diabetes mellitus, type II ([place])    a. HgA1c 6.8 in 03/2015  . ED (erectile dysfunction)   . Gout   . Hemangioma of liver 08/02/2015   12 mm enhancing lesion seen on CT - suspicious for benign hemangioma but not diagnostic - MRI recommended  . HLD (hyperlipidemia)   . Hypertensive heart disease   . Kidney stones   . Lower extremity edema    a. chronic  . OA (osteoarthritis)   . Orthostatic hypotension   . PAF (paroxysmal atrial fibrillation) ([place])    a. 10/2015 s/p DCCV;  b. CHA2DS2VASc = 6--> on coumadin; c. 10/2015 Echo: sev dil LA.  Marland Kitchen Physical deconditioning   . Prostate cancer ([place])   . Pulmonary HTN ([place])   . Pulmonary hypertension ([place])    a. 10/2015 Echo: PASP 69mmHg.  Marland Kitchen Severe mitral regurgitation    a.  10/2015 s/p minimally invasive MV repair; b. 10/2015 Echo: mod MS, mean grad 28mmHg.  Marland Kitchen Spinal stenosis   . SVT (supraventricular tachycardia) ([facility])    a. recurrent, usually responsive to vagal manuevers  . Tricuspid regurgitation    a. 10/2015 Echo: mild to mod TR.   Past Surgical History:  Procedure Laterality Date  . BACK SURGERY    . Back  surgery x 2    . CARDIAC CATHETERIZATION N/A 08/08/2015   Procedure: Right/Left Heart Cath and Coronary Angiography;  Surgeon: Sherren Mocha, MD;  Location: [place] [facility];  Service: Cardiovascular;  Laterality: N/A;  . CARDIAC SURGERY     2 cardiac stents  . CARDIOVERSION N/A 10/20/2015   Procedure: CARDIOVERSION;  Surgeon: Pixie Casino, MD;  Location: [place];  Service: Cardiovascular;  Laterality: N/A;  . CERVICAL SPINE SURGERY    . EYE SURGERY    . INTRAOPERATIVE TRANSESOPHAGEAL ECHOCARDIOGRAM  10/05/2015   Procedure: INTRAOPERATIVE TRANSESOPHAGEAL ECHOCARDIOGRAM;  Surgeon: Rexene Alberts, MD;  Location: [facility] OR;  Service: Open Heart Surgery;;  . JOINT REPLACEMENT     shoulder  . LEFT HEART CATHETERIZATION WITH CORONARY ANGIOGRAM N/A 11/16/2014   Procedure: LEFT HEART CATHETERIZATION WITH CORONARY ANGIOGRAM;  Surgeon: Troy Sine, MD;  Location: [facility] C[facility] LAB;  Service: Cardiovascular;  Laterality: N/A;  . MITRAL VALVE REPAIR Right 10/04/2015   Procedure: MINIMALLY INVASIVE MITRAL VALVE REPAIR (MVR);  Surgeon: Rexene Alberts, MD;  Location: [place];  Service: Open Heart Surgery;  Laterality: Right;  . Rt rotator cuff repair    . TEE WITHOUT CARDIOVERSION N/A 06/22/2015   Procedure: TRANSESOPHAGEAL ECHOCARDIOGRAM (TEE);  Surgeon: Skeet Latch, MD;  Location: [place];  Service: Cardiovascular;  Laterality: N/A;  . TEE WITHOUT CARDIOVERSION N/A 10/04/2015   Procedure: TRANSESOPHAGEAL ECHOCARDIOGRAM (TEE);  Surgeon: Rexene Alberts, MD;  Location: [place];  Service: Open Heart Surgery;  Laterality: N/A;  . WOUND EXPLORATION N/A 10/05/2015   Procedure: Sternal Incision;  Surgeon: Rexene Alberts, MD;  Location: [place];  Service: Open Heart Surgery;  Laterality: N/A;   Current Outpatient Prescriptions on File Prior to Visit  Medication Sig Dispense Refill  . aspirin EC 81 MG tablet Take 81 mg by mouth every other day. Reported on 12/29/2015    . finasteride (PROSCAR) 5 MG tablet TAKE  ONE (1) TABLET EACH DAY 30 tablet 3  . fluticasone (FLONASE) 50 MCG/ACT nasal spray Place 1 spray into both nostrils daily as needed for allergies or rhinitis.    . furosemide (LASIX) 40 MG tablet Take 20 mg by mouth 2 (two) times daily.     . metoprolol tartrate (LOPRESSOR) 25 MG tablet Take 0.5 tablets (12.5 mg total) by mouth 2 (two) times daily. 60 tablet 3  . potassium chloride SA (K-DUR,KLOR-CON) 20 MEQ tablet Take 1 tablet (20 mEq total) by mouth daily. 30 tablet 1  . pravastatin (PRAVACHOL) 40 MG tablet Take 1 tablet (40 mg total) by mouth daily at 6 PM. 30 tablet 11  . warfarin (COUMADIN) 2 MG tablet Take 1-2 tablets daily or as directed by coumadin clinic 45 tablet 1  . ondansetron (ZOFRAN) 4 MG tablet Take 1 tablet (4 mg total) by mouth every 8 (eight) hours as needed for nausea or vomiting. (Patient not taking: Reported on 02/26/2016) 20 tablet 0   No current facility-administered medications on file prior to visit.    No Known Allergies Social History   Social History  . Marital status: Married    Spouse name: N/A  . Number of children: 4  . Years of education: N/A   Occupational History  .  Retired   Social History Main Topics  . Smoking status: Former Research scientist (life sciences)  . Smokeless tobacco: Never Used     Comment: QUIT SMOKING  MANY YEARS AGO"  . Alcohol use No  . Drug use: No  . Sexual activity: Not on file   Other Topics Concern  . Not on file   Social History Narrative   ** Merged History Encounter **       Four adopted children.  Lives alone.      Review of Systems  All other systems reviewed and are negative.      Objective:   Physical Exam  Neck: Neck supple. No JVD present.  Cardiovascular: Normal rate and regular rhythm.   Murmur heard. Pulmonary/Chest: Effort normal. He has no wheezes. He has no rales.  Abdominal: Soft. Bowel sounds are normal.  Musculoskeletal: He exhibits edema.  Lymphadenopathy:    He has no cervical adenopathy.  Neurological: He  has normal reflexes. No cranial nerve deficit. He exhibits normal muscle tone.  Vitals reviewed.    Wt Readings from Last 3 Encounters:  02/26/16 188 lb (85.3 kg)  02/09/16 190 lb (86.2 kg)  02/02/16 191 lb (86.6 kg)        Assessment & Plan:  Skin tear. Tegaderm was applied to the skin tear and that was wrapped with Coban and as a pressure dressing. I recommended rewrapping the area daily with Coban and then I gave the patient supplies for this. I will replace the Tegaderm on Thursday  and continue replacing the dressings every 3-4 days until the wound has healed.

## 2016-02-28 ENCOUNTER — Ambulatory Visit (INDEPENDENT_AMBULATORY_CARE_PROVIDER_SITE_OTHER): Payer: Medicare Other

## 2016-02-28 DIAGNOSIS — Z7901 Long term (current) use of anticoagulants: Secondary | ICD-10-CM | POA: Diagnosis not present

## 2016-02-28 DIAGNOSIS — Z9889 Other specified postprocedural states: Secondary | ICD-10-CM | POA: Diagnosis not present

## 2016-02-28 DIAGNOSIS — Z961 Presence of intraocular lens: Secondary | ICD-10-CM | POA: Diagnosis not present

## 2016-02-28 LAB — POCT INR: INR: 2.2

## 2016-02-29 ENCOUNTER — Ambulatory Visit (INDEPENDENT_AMBULATORY_CARE_PROVIDER_SITE_OTHER): Payer: Medicare Other | Admitting: Family Medicine

## 2016-02-29 VITALS — BP 140/68 | HR 80 | Temp 97.6°F | Resp 18 | Ht 67.0 in | Wt 192.0 lb

## 2016-02-29 DIAGNOSIS — R600 Localized edema: Secondary | ICD-10-CM | POA: Diagnosis not present

## 2016-02-29 DIAGNOSIS — IMO0001 Reserved for inherently not codable concepts without codable children: Secondary | ICD-10-CM

## 2016-02-29 DIAGNOSIS — I83029 Varicose veins of left lower extremity with ulcer of unspecified site: Secondary | ICD-10-CM | POA: Diagnosis not present

## 2016-02-29 MED ORDER — TRAMADOL HCL 50 MG PO TABS: 50.0000 mg | ORAL_TABLET | Freq: Four times a day (QID) | ORAL | 0 refills | Status: DC | PRN

## 2016-02-29 NOTE — Progress Notes (Signed)
Subjective:    Patient ID: [name], male    DOB: February 05, 1929, 82 y.o.   MRN: [id number]  HPI Please see my last office visit.  Patient is here today to recheck the wound on his leg. Since his last office visit he has gained 4 pounds. He now has +2 pitting edema to his mid shin. The very shallow ulcer on his right shin is weeping serous fluid through the Tegaderm. He denies any chest pain or shortness of breath although he does have right basilar crackles. There is no JVD. He is supposed to be on Lasix 20 mg twice daily. Patient is unable to tell me for sure if that's what he is taking. He states he's taking everything on the list but in all honesty he is not sure what medicines are on the list. His wife manages his medications. Past Medical History:  Diagnosis Date  . Chronic diastolic (congestive) heart failure ([place])    a. 10/2013 Echo: EF 55-60%, basal-mid anteroseptal and basal-mid inferoseptal HK.  . Chronic fatigue   . Chronic subdural hematoma ([place])    a. 12/2015 Head CT: chronic right holo-hemispheric SDH w/o midline shift.  . Coronary artery disease    a. 11/2014 NSTEMI: DESx 2 placed to LAD and RCA; b. 08/2015 Cath: LAD patent stent, LCX 14m, RCA patent stent, 54m.  . Diabetes mellitus, type II ([place])    a. HgA1c 6.8 in 03/2015  . ED (erectile dysfunction)   . Gout   . Hemangioma of liver 08/02/2015   12 mm enhancing lesion seen on CT - suspicious for benign hemangioma but not diagnostic - MRI recommended  . HLD (hyperlipidemia)   . Hypertensive heart disease   . Kidney stones   . Lower extremity edema    a. chronic  . OA (osteoarthritis)   . Orthostatic hypotension   . PAF (paroxysmal atrial fibrillation) ([place])    a. 10/2015 s/p DCCV;  b. CHA2DS2VASc = 6--> on coumadin; c. 10/2015 Echo: sev dil LA.  Marland Kitchen Physical deconditioning   . Prostate cancer ([place])   . Pulmonary HTN ([place])   . Pulmonary hypertension ([place])    a. 10/2015 Echo: PASP 77mmHg.  Marland Kitchen Severe mitral regurgitation      a.  10/2015 s/p minimally invasive MV repair; b. 10/2015 Echo: mod MS, mean grad 12mmHg.  Marland Kitchen Spinal stenosis   . SVT (supraventricular tachycardia) ([facility])    a. recurrent, usually responsive to vagal manuevers  . Tricuspid regurgitation    a. 10/2015 Echo: mild to mod TR.   Past Surgical History:  Procedure Laterality Date  . BACK SURGERY    . Back surgery x 2    . CARDIAC CATHETERIZATION N/A 08/08/2015   Procedure: Right/Left Heart Cath and Coronary Angiography;  Surgeon: Sherren Mocha, MD;  Location: [place] [facility];  Service: Cardiovascular;  Laterality: N/A;  . CARDIAC SURGERY     2 cardiac stents  . CARDIOVERSION N/A 10/20/2015   Procedure: CARDIOVERSION;  Surgeon: Pixie Casino, MD;  Location: [place];  Service: Cardiovascular;  Laterality: N/A;  . CERVICAL SPINE SURGERY    . EYE SURGERY    . INTRAOPERATIVE TRANSESOPHAGEAL ECHOCARDIOGRAM  10/05/2015   Procedure: INTRAOPERATIVE TRANSESOPHAGEAL ECHOCARDIOGRAM;  Surgeon: Rexene Alberts, MD;  Location: [facility] OR;  Service: Open Heart Surgery;;  . JOINT REPLACEMENT     shoulder  . LEFT HEART CATHETERIZATION WITH CORONARY ANGIOGRAM N/A 11/16/2014   Procedure: LEFT HEART CATHETERIZATION WITH CORONARY ANGIOGRAM;  Surgeon: Marcello Moores  Floyce Stakes, MD;  Location: [facility] CATH LAB;  Service: Cardiovascular;  Laterality: N/A;  . MITRAL VALVE REPAIR Right 10/04/2015   Procedure: MINIMALLY INVASIVE MITRAL VALVE REPAIR (MVR);  Surgeon: Rexene Alberts, MD;  Location: [place];  Service: Open Heart Surgery;  Laterality: Right;  . Rt rotator cuff repair    . TEE WITHOUT CARDIOVERSION N/A 06/22/2015   Procedure: TRANSESOPHAGEAL ECHOCARDIOGRAM (TEE);  Surgeon: Skeet Latch, MD;  Location: [place];  Service: Cardiovascular;  Laterality: N/A;  . TEE WITHOUT CARDIOVERSION N/A 10/04/2015   Procedure: TRANSESOPHAGEAL ECHOCARDIOGRAM (TEE);  Surgeon: Rexene Alberts, MD;  Location: [place];  Service: Open Heart Surgery;  Laterality: N/A;  . WOUND EXPLORATION N/A  10/05/2015   Procedure: Sternal Incision;  Surgeon: Rexene Alberts, MD;  Location: [place];  Service: Open Heart Surgery;  Laterality: N/A;   Current Outpatient Prescriptions on File Prior to Visit  Medication Sig Dispense Refill  . aspirin EC 81 MG tablet Take 81 mg by mouth every other day. Reported on 12/29/2015    . finasteride (PROSCAR) 5 MG tablet TAKE ONE (1) TABLET EACH DAY 30 tablet 3  . fluticasone (FLONASE) 50 MCG/ACT nasal spray Place 1 spray into both nostrils daily as needed for allergies or rhinitis.    . furosemide (LASIX) 40 MG tablet Take 20 mg by mouth 2 (two) times daily.     . metoprolol tartrate (LOPRESSOR) 25 MG tablet Take 0.5 tablets (12.5 mg total) by mouth 2 (two) times daily. 60 tablet 3  . potassium chloride SA (K-DUR,KLOR-CON) 20 MEQ tablet Take 1 tablet (20 mEq total) by mouth daily. 30 tablet 1  . pravastatin (PRAVACHOL) 40 MG tablet Take 1 tablet (40 mg total) by mouth daily at 6 PM. 30 tablet 11  . warfarin (COUMADIN) 2 MG tablet Take 1-2 tablets daily or as directed by coumadin clinic 45 tablet 1  . ondansetron (ZOFRAN) 4 MG tablet Take 1 tablet (4 mg total) by mouth every 8 (eight) hours as needed for nausea or vomiting. (Patient not taking: Reported on 02/26/2016) 20 tablet 0   No current facility-administered medications on file prior to visit.    No Known Allergies Social History   Social History  . Marital status: Married    Spouse name: N/A  . Number of children: 4  . Years of education: N/A   Occupational History  .  Retired   Social History Main Topics  . Smoking status: Former Research scientist (life sciences)  . Smokeless tobacco: Never Used     Comment: QUIT SMOKING  MANY YEARS AGO"  . Alcohol use No  . Drug use: No  . Sexual activity: Not on file   Other Topics Concern  . Not on file   Social History Narrative   ** Merged History Encounter **       Four adopted children.  Lives alone.      Review of Systems  All other systems reviewed and are  negative.      Objective:   Physical Exam  Neck: Neck supple. No JVD present.  Cardiovascular: Normal rate and regular rhythm.   Murmur heard. Pulmonary/Chest: Effort normal. He has no wheezes. He has no rales.  Abdominal: Soft. Bowel sounds are normal.  Musculoskeletal: He exhibits edema.  Lymphadenopathy:    He has no cervical adenopathy.  Neurological: He has normal reflexes. No cranial nerve deficit. He exhibits normal muscle tone.  Vitals reviewed.    Wt Readings from Last 3 Encounters:  02/29/16 192  lb (87.1 kg)  02/26/16 188 lb (85.3 kg)  02/09/16 190 lb (86.2 kg)        Assessment & Plan:  Bilateral edema of lower extremity  Venous stasis ulcer, left ([place])  Patient seems slightly fluid overloaded on exam today. Increase Lasix to 40 mg twice daily and recheck on Monday. The small shallow ulcer on his right shin was covered with a Band-Aid and the patient was placed back in his compression stockings. Recheck here on Monday to reassess fluid status

## 2016-03-04 ENCOUNTER — Encounter: Payer: Self-pay | Admitting: Family Medicine

## 2016-03-04 ENCOUNTER — Ambulatory Visit (INDEPENDENT_AMBULATORY_CARE_PROVIDER_SITE_OTHER): Payer: Medicare Other | Admitting: Family Medicine

## 2016-03-04 VITALS — BP 106/58 | HR 80 | Temp 97.6°F | Resp 18 | Ht 67.0 in | Wt 190.0 lb

## 2016-03-04 DIAGNOSIS — R600 Localized edema: Secondary | ICD-10-CM | POA: Diagnosis not present

## 2016-03-04 NOTE — Progress Notes (Signed)
Subjective:    Patient ID: [name], male    DOB: 06-09-1929, 82 y.o.   MRN: [id number]  HPI  02/29/16 Please see my last office visit.  Patient is here today to recheck the wound on his leg. Since his last office visit he has gained 4 pounds. He now has +2 pitting edema to his mid shin. The very shallow ulcer on his right shin is weeping serous fluid through the Tegaderm. He denies any chest pain or shortness of breath although he does have right basilar crackles. There is no JVD. He is supposed to be on Lasix 20 mg twice daily. Patient is unable to tell me for sure if that's what he is taking. He states he's taking everything on the list but in all honesty he is not sure what medicines are on the list. His wife manages his medications.  At that time, my plan was: Patient seems slightly fluid overloaded on exam today. Increase Lasix to 40 mg twice daily and recheck on Monday. The small shallow ulcer on his right shin was covered with a Band-Aid and the patient was placed back in his compression stockings. Recheck here on Monday to reassess fluid status  03/04/16 Patient has only lost 2 pounds since last week. He still has +2 pitting edema in both legs. He still has faint bibasilar crackles. However after I questioned the patient further, he is only taking 40 mg once a day. I want him to take 40 mg twice a day. Patient became confused and only take it once a day Past Medical History:  Diagnosis Date  . Chronic diastolic (congestive) heart failure ([place])    a. 10/2013 Echo: EF 55-60%, basal-mid anteroseptal and basal-mid inferoseptal HK.  . Chronic fatigue   . Chronic subdural hematoma ([place])    a. 12/2015 Head CT: chronic right holo-hemispheric SDH w/o midline shift.  . Coronary artery disease    a. 11/2014 NSTEMI: DESx 2 placed to LAD and RCA; b. 08/2015 Cath: LAD patent stent, LCX 4m, RCA patent stent, 70m.  . Diabetes mellitus, type II ([place])    a. HgA1c 6.8 in 03/2015  . ED (erectile  dysfunction)   . Gout   . Hemangioma of liver 08/02/2015   12 mm enhancing lesion seen on CT - suspicious for benign hemangioma but not diagnostic - MRI recommended  . HLD (hyperlipidemia)   . Hypertensive heart disease   . Kidney stones   . Lower extremity edema    a. chronic  . OA (osteoarthritis)   . Orthostatic hypotension   . PAF (paroxysmal atrial fibrillation) ([place])    a. 10/2015 s/p DCCV;  b. CHA2DS2VASc = 6--> on coumadin; c. 10/2015 Echo: sev dil LA.  Marland Kitchen Physical deconditioning   . Prostate cancer ([place])   . Pulmonary HTN ([place])   . Pulmonary hypertension ([place])    a. 10/2015 Echo: PASP 57mmHg.  Marland Kitchen Severe mitral regurgitation    a.  10/2015 s/p minimally invasive MV repair; b. 10/2015 Echo: mod MS, mean grad 44mmHg.  Marland Kitchen Spinal stenosis   . SVT (supraventricular tachycardia) ([facility])    a. recurrent, usually responsive to vagal manuevers  . Tricuspid regurgitation    a. 10/2015 Echo: mild to mod TR.   Past Surgical History:  Procedure Laterality Date  . BACK SURGERY    . Back surgery x 2    . CARDIAC CATHETERIZATION N/A 08/08/2015   Procedure: Right/Left Heart Cath and Coronary Angiography;  Surgeon: Sherren Mocha, MD;  Location: [place] [facility] LAB;  Service: Cardiovascular;  Laterality: N/A;  . CARDIAC SURGERY     2 cardiac stents  . CARDIOVERSION N/A 10/20/2015   Procedure: CARDIOVERSION;  Surgeon: Pixie Casino, MD;  Location: [place];  Service: Cardiovascular;  Laterality: N/A;  . CERVICAL SPINE SURGERY    . EYE SURGERY    . INTRAOPERATIVE TRANSESOPHAGEAL ECHOCARDIOGRAM  10/05/2015   Procedure: INTRAOPERATIVE TRANSESOPHAGEAL ECHOCARDIOGRAM;  Surgeon: Rexene Alberts, MD;  Location: [facility] OR;  Service: Open Heart Surgery;;  . JOINT REPLACEMENT     shoulder  . LEFT HEART CATHETERIZATION WITH CORONARY ANGIOGRAM N/A 11/16/2014   Procedure: LEFT HEART CATHETERIZATION WITH CORONARY ANGIOGRAM;  Surgeon: Troy Sine, MD;  Location[facility] CATH LAB;  Service: Cardiovascular;  Laterality:  N/A;  . MITRAL VALVE REPAIR Right 10/04/2015   Procedure: MINIMALLY INVASIVE MITRAL VALVE REPAIR (MVR);  Surgeon: Rexene Alberts, MD;  Location: [place];  Service: Open Heart Surgery;  Laterality: Right;  . Rt rotator cuff repair    . TEE WITHOUT CARDIOVERSION N/A 06/22/2015   Procedure: TRANSESOPHAGEAL ECHOCARDIOGRAM (TEE);  Surgeon: Skeet Latch, MD;  Location: [place];  Service: Cardiovascular;  Laterality: N/A;  . TEE WITHOUT CARDIOVERSION N/A 10/04/2015   Procedure: TRANSESOPHAGEAL ECHOCARDIOGRAM (TEE);  Surgeon: Rexene Alberts, MD;  Location: [place];  Service: Open Heart Surgery;  Laterality: N/A;  . WOUND EXPLORATION N/A 10/05/2015   Procedure: Sternal Incision;  Surgeon: Rexene Alberts, MD;  Location: [place];  Service: Open Heart Surgery;  Laterality: N/A;   Current Outpatient Prescriptions on File Prior to Visit  Medication Sig Dispense Refill  . aspirin EC 81 MG tablet Take 81 mg by mouth every other day. Reported on 12/29/2015    . finasteride (PROSCAR) 5 MG tablet TAKE ONE (1) TABLET EACH DAY 30 tablet 3  . fluticasone (FLONASE) 50 MCG/ACT nasal spray Place 1 spray into both nostrils daily as needed for allergies or rhinitis.    . furosemide (LASIX) 40 MG tablet Take 20 mg by mouth 2 (two) times daily.     . metoprolol tartrate (LOPRESSOR) 25 MG tablet Take 0.5 tablets (12.5 mg total) by mouth 2 (two) times daily. 60 tablet 3  . ondansetron (ZOFRAN) 4 MG tablet Take 1 tablet (4 mg total) by mouth every 8 (eight) hours as needed for nausea or vomiting. 20 tablet 0  . potassium chloride SA (K-DUR,KLOR-CON) 20 MEQ tablet Take 1 tablet (20 mEq total) by mouth daily. 30 tablet 1  . pravastatin (PRAVACHOL) 40 MG tablet Take 1 tablet (40 mg total) by mouth daily at 6 PM. 30 tablet 11  . traMADol (ULTRAM) 50 MG tablet Take 1 tablet (50 mg total) by mouth every 6 (six) hours as needed for moderate pain. 30 tablet 0  . warfarin (COUMADIN) 2 MG tablet Take 1-2 tablets daily or as directed by  coumadin clinic 45 tablet 1   No current facility-administered medications on file prior to visit.    No Known Allergies Social History   Social History  . Marital status: Married    Spouse name: N/A  . Number of children: 4  . Years of education: N/A   Occupational History  .  Retired   Social History Main Topics  . Smoking status: Former Research scientist (life sciences)  . Smokeless tobacco: Never Used     Comment: QUIT SMOKING  MANY YEARS AGO"  . Alcohol use No  . Drug use: No  . Sexual activity: Not on file  Other Topics Concern  . Not on file   Social History Narrative   ** Merged History Encounter **       Four adopted children.  Lives alone.      Review of Systems  All other systems reviewed and are negative.      Objective:   Physical Exam  Neck: Neck supple. No JVD present.  Cardiovascular: Normal rate and regular rhythm.   Murmur heard. Pulmonary/Chest: Effort normal. He has no wheezes. He has no rales.  Abdominal: Soft. Bowel sounds are normal.  Musculoskeletal: He exhibits edema.  Lymphadenopathy:    He has no cervical adenopathy.  Neurological: He has normal reflexes. No cranial nerve deficit. He exhibits normal muscle tone.  Vitals reviewed.    Wt Readings from Last 3 Encounters:  03/04/16 190 lb (86.2 kg)  02/29/16 192 lb (87.1 kg)  02/26/16 188 lb (85.3 kg)        Assessment & Plan:  Localized edema  Bilateral edema of lower extremity  Increase Lasix to 40 mg twice daily and recheck in one week

## 2016-03-08 ENCOUNTER — Telehealth: Payer: Self-pay | Admitting: Family Medicine

## 2016-03-08 NOTE — Telephone Encounter (Signed)
Pt states that the is taking the lasix like md told him to however when questioned he is only taking whatever he has twice a day. Per [facility] needs to be on lasix 80mg  bid and recheck on Monday. His weight was 193 lbs today.  Called and spoke to wife and she states he is taking 40mg  - told her to be sure to increase 80 mg bid and recheck on monday

## 2016-03-11 ENCOUNTER — Encounter: Payer: Self-pay | Admitting: Family Medicine

## 2016-03-11 ENCOUNTER — Ambulatory Visit (INDEPENDENT_AMBULATORY_CARE_PROVIDER_SITE_OTHER): Payer: Medicare Other | Admitting: Family Medicine

## 2016-03-11 VITALS — BP 100/58 | HR 64 | Temp 97.6°F | Resp 16 | Ht 67.0 in | Wt 186.0 lb

## 2016-03-11 DIAGNOSIS — I509 Heart failure, unspecified: Secondary | ICD-10-CM | POA: Diagnosis not present

## 2016-03-11 DIAGNOSIS — R609 Edema, unspecified: Secondary | ICD-10-CM

## 2016-03-11 LAB — COMPLETE METABOLIC PANEL WITH GFR
ALBUMIN: 3.3 g/dL — AB (ref 3.6–5.1)
ALK PHOS: 95 U/L (ref 40–115)
ALT: 15 U/L (ref 9–46)
AST: 30 U/L (ref 10–35)
BUN: 17 mg/dL (ref 7–25)
CALCIUM: 9.3 mg/dL (ref 8.6–10.3)
CO2: 31 mmol/L (ref 20–31)
CREATININE: 1.1 mg/dL (ref 0.70–1.11)
Chloride: 95 mmol/L — ABNORMAL LOW (ref 98–110)
GFR, Est African American: 71 mL/min (ref >=60)
GFR, Est Non African American: 61 mL/min (ref >=60)
GLUCOSE: 121 mg/dL — AB (ref 70–99)
POTASSIUM: 3.8 mmol/L (ref 3.5–5.3)
SODIUM: 137 mmol/L (ref 135–146)
TOTAL PROTEIN: 6.2 g/dL (ref 6.1–8.1)
Total Bilirubin: 0.8 mg/dL (ref 0.2–1.2)

## 2016-03-11 LAB — CBC WITH DIFFERENTIAL/PLATELET
BASOS PCT: 1 %
Basophils Absolute: 80 cells/uL (ref 0–200)
EOS PCT: 3 %
Eosinophils Absolute: 240 cells/uL (ref 15–500)
HEMATOCRIT: 43.1 % (ref 38.5–50.0)
HEMOGLOBIN: 13.7 g/dL (ref 13.0–17.0)
LYMPHS ABS: 3440 {cells}/uL (ref 850–3900)
Lymphocytes Relative: 43 %
MCH: 29.1 pg (ref 27.0–33.0)
MCHC: 31.8 g/dL — AB (ref 32.0–36.0)
MCV: 91.7 fL (ref 80.0–100.0)
MONO ABS: 480 {cells}/uL (ref 200–950)
MPV: 10 fL (ref 7.5–12.5)
Monocytes Relative: 6 %
NEUTROS ABS: 3760 {cells}/uL (ref 1500–7800)
NEUTROS PCT: 47 %
Platelets: 173 10*3/uL (ref 140–400)
RBC: 4.7 MIL/uL (ref 4.20–5.80)
RDW: 15.7 % — ABNORMAL HIGH (ref 11.0–15.0)
WBC: 8 10*3/uL (ref 3.8–10.8)

## 2016-03-11 NOTE — Progress Notes (Signed)
Subjective:    Patient ID: [name], male    DOB: 10-15-1928, 82 y.o.   MRN: [id number]  HPI  02/29/16 Please see my last office visit.  Patient is here today to recheck the wound on his leg. Since his last office visit he has gained 4 pounds. He now has +2 pitting edema to his mid shin. The very shallow ulcer on his right shin is weeping serous fluid through the Tegaderm. He denies any chest pain or shortness of breath although he does have right basilar crackles. There is no JVD. He is supposed to be on Lasix 20 mg twice daily. Patient is unable to tell me for sure if that's what he is taking. He states he's taking everything on the list but in all honesty he is not sure what medicines are on the list. His wife manages his medications.  At that time, my plan was: Patient seems slightly fluid overloaded on exam today. Increase Lasix to 40 mg twice daily and recheck on Monday. The small shallow ulcer on his right shin was covered with a Band-Aid and the patient was placed back in his compression stockings. Recheck here on Monday to reassess fluid status  03/04/16 Patient has only lost 2 pounds since last week. He still has +2 pitting edema in both legs. He still has faint bibasilar crackles. However after I questioned the patient further, he is only taking 40 mg once a day. I want him to take 40 mg twice a day. Patient became confused and only take it once a day.  At that time, my plan was: Increase Lasix to 40 mg twice daily and recheck in one week  03/11/16 Patient actually came by my office to August 4 and had gained an additional 3 pounds up to 195. He was adamant that he was taking Lasix 40 mg twice a day. Although he never brings his pill bottles and he is never able to take the dose until you tell him what dose she want him on. This raises the concern that his dose could be incorrect at home. However on Friday I instructed him to take 80 mg of Lasix twice a day and then recheck today. He  has lost 4 pounds since his last office visit. His lungs sound clear. He states that he feels better. He still has +1 pitting edema to his knees and +2 bipedal edema in the feet. Past Medical History:  Diagnosis Date  . Chronic diastolic (congestive) heart failure ([place])    a. 10/2013 Echo: EF 55-60%, basal-mid anteroseptal and basal-mid inferoseptal HK.  . Chronic fatigue   . Chronic subdural hematoma ([place])    a. 12/2015 Head CT: chronic right holo-hemispheric SDH w/o midline shift.  . Coronary artery disease    a. 11/2014 NSTEMI: DESx 2 placed to LAD and RCA; b. 08/2015 Cath: LAD patent stent, LCX 43m, RCA patent stent, 82m.  . Diabetes mellitus, type II ([place])    a. HgA1c 6.8 in 03/2015  . ED (erectile dysfunction)   . Gout   . Hemangioma of liver 08/02/2015   12 mm enhancing lesion seen on CT - suspicious for benign hemangioma but not diagnostic - MRI recommended  . HLD (hyperlipidemia)   . Hypertensive heart disease   . Kidney stones   . Lower extremity edema    a. chronic  . OA (osteoarthritis)   . Orthostatic hypotension   . PAF (paroxysmal atrial fibrillation) ([place])    a. 10/2015 s/p  DCCV;  b. CHA2DS2VASc = 6--> on coumadin; c. 10/2015 Echo: sev dil LA.  Marland Kitchen Physical deconditioning   . Prostate cancer ([place])   . Pulmonary HTN ([place])   . Pulmonary hypertension ([place])    a. 10/2015 Echo: PASP 9mmHg.  Marland Kitchen Severe mitral regurgitation    a.  10/2015 s/p minimally invasive MV repair; b. 10/2015 Echo: mod MS, mean grad 38mmHg.  Marland Kitchen Spinal stenosis   . SVT (supraventricular tachycardia) ([facility])    a. recurrent, usually responsive to vagal manuevers  . Tricuspid regurgitation    a. 10/2015 Echo: mild to mod TR.   Past Surgical History:  Procedure Laterality Date  . BACK SURGERY    . Back surgery x 2    . CARDIAC CATHETERIZATION N/A 08/08/2015   Procedure: Right/Left Heart Cath and Coronary Angiography;  Surgeon: Sherren Mocha, MD;  Location: [place] [facility];  Service: Cardiovascular;  Laterality:  N/A;  . CARDIAC SURGERY     2 cardiac stents  . CARDIOVERSION N/A 10/20/2015   Procedure: CARDIOVERSION;  Surgeon: Pixie Casino, MD;  Location: [place];  Service: Cardiovascular;  Laterality: N/A;  . CERVICAL SPINE SURGERY    . EYE SURGERY    . INTRAOPERATIVE TRANSESOPHAGEAL ECHOCARDIOGRAM  10/05/2015   Procedure: INTRAOPERATIVE TRANSESOPHAGEAL ECHOCARDIOGRAM;  Surgeon: Rexene Alberts, MD;  Location: [facility] OR;  Service: Open Heart Surgery;;  . JOINT REPLACEMENT     shoulder  . LEFT HEART CATHETERIZATION WITH CORONARY ANGIOGRAM N/A 11/16/2014   Procedure: LEFT HEART CATHETERIZATION WITH CORONARY ANGIOGRAM;  Surgeon: Troy Sine, MD;  Location: [facility] CATH LAB;  Service: Cardiovascular;  Laterality: N/A;  . MITRAL VALVE REPAIR Right 10/04/2015   Procedure: MINIMALLY INVASIVE MITRAL VALVE REPAIR (MVR);  Surgeon: Rexene Alberts, MD;  Location: [place];  Service: Open Heart Surgery;  Laterality: Right;  . Rt rotator cuff repair    . TEE WITHOUT CARDIOVERSION N/A 06/22/2015   Procedure: TRANSESOPHAGEAL ECHOCARDIOGRAM (TEE);  Surgeon: Skeet Latch, MD;  Location: [place];  Service: Cardiovascular;  Laterality: N/A;  . TEE WITHOUT CARDIOVERSION N/A 10/04/2015   Procedure: TRANSESOPHAGEAL ECHOCARDIOGRAM (TEE);  Surgeon: Rexene Alberts, MD;  Location: [place];  Service: Open Heart Surgery;  Laterality: N/A;  . WOUND EXPLORATION N/A 10/05/2015   Procedure: Sternal Incision;  Surgeon: Rexene Alberts, MD;  Location: [place];  Service: Open Heart Surgery;  Laterality: N/A;   Current Outpatient Prescriptions on File Prior to Visit  Medication Sig Dispense Refill  . aspirin EC 81 MG tablet Take 81 mg by mouth every other day. Reported on 12/29/2015    . finasteride (PROSCAR) 5 MG tablet TAKE ONE (1) TABLET EACH DAY 30 tablet 3  . fluticasone (FLONASE) 50 MCG/ACT nasal spray Place 1 spray into both nostrils daily as needed for allergies or rhinitis.    . furosemide (LASIX) 40 MG tablet Take 80 mg by mouth  2 (two) times daily.     . metoprolol tartrate (LOPRESSOR) 25 MG tablet Take 0.5 tablets (12.5 mg total) by mouth 2 (two) times daily. 60 tablet 3  . ondansetron (ZOFRAN) 4 MG tablet Take 1 tablet (4 mg total) by mouth every 8 (eight) hours as needed for nausea or vomiting. 20 tablet 0  . potassium chloride SA (K-DUR,KLOR-CON) 20 MEQ tablet Take 1 tablet (20 mEq total) by mouth daily. 30 tablet 1  . pravastatin (PRAVACHOL) 40 MG tablet Take 1 tablet (40 mg total) by mouth daily at 6 PM. 30 tablet 11  .  traMADol (ULTRAM) 50 MG tablet Take 1 tablet (50 mg total) by mouth every 6 (six) hours as needed for moderate pain. 30 tablet 0  . warfarin (COUMADIN) 2 MG tablet Take 1-2 tablets daily or as directed by coumadin clinic 45 tablet 1   No current facility-administered medications on file prior to visit.    No Known Allergies Social History   Social History  . Marital status: Married    Spouse name: N/A  . Number of children: 4  . Years of education: N/A   Occupational History  .  Retired   Social History Main Topics  . Smoking status: Former Research scientist (life sciences)  . Smokeless tobacco: Never Used     Comment: QUIT SMOKING  MANY YEARS AGO"  . Alcohol use No  . Drug use: No  . Sexual activity: Not on file   Other Topics Concern  . Not on file   Social History Narrative   ** Merged History Encounter **       Four adopted children.  Lives alone.      Review of Systems  All other systems reviewed and are negative.      Objective:   Physical Exam  Neck: Neck supple. No JVD present.  Cardiovascular: Normal rate and regular rhythm.   Murmur heard. Pulmonary/Chest: Effort normal. He has no wheezes. He has no rales.  Abdominal: Soft. Bowel sounds are normal.  Musculoskeletal: He exhibits edema.  Lymphadenopathy:    He has no cervical adenopathy.  Neurological: He has normal reflexes. No cranial nerve deficit. He exhibits normal muscle tone.  Vitals reviewed.    Wt Readings from Last 3  Encounters:  03/11/16 186 lb (84.4 kg)  03/04/16 190 lb (86.2 kg)  02/29/16 192 lb (87.1 kg)        Assessment & Plan:  Edema due to congestive heart failure ([place]) - Plan: CBC with Differential/Platelet, COMPLETE METABOLIC PANEL WITH GFR  I will check a BMP to monitor his potassium and renal function. I would like to continue the higher dose diuretic as long as it is not impacting his renal function or his potassium. Patient will continue 80 mg twice a day until I call him with results of lab work

## 2016-03-12 NOTE — Progress Notes (Signed)
Cell # disconnected   Kidney fcn is actually better on higher dose of lasix. Continue lasix 80 mg bid and recheck in 1 week

## 2016-03-20 ENCOUNTER — Ambulatory Visit (INDEPENDENT_AMBULATORY_CARE_PROVIDER_SITE_OTHER): Payer: Medicare Other

## 2016-03-20 DIAGNOSIS — Z9889 Other specified postprocedural states: Secondary | ICD-10-CM | POA: Diagnosis not present

## 2016-03-20 DIAGNOSIS — Z7901 Long term (current) use of anticoagulants: Secondary | ICD-10-CM

## 2016-03-20 LAB — POCT INR: INR: 1.6

## 2016-03-21 ENCOUNTER — Ambulatory Visit (INDEPENDENT_AMBULATORY_CARE_PROVIDER_SITE_OTHER): Payer: Medicare Other | Admitting: Family Medicine

## 2016-03-21 ENCOUNTER — Encounter: Payer: Self-pay | Admitting: Family Medicine

## 2016-03-21 VITALS — BP 130/70 | HR 80 | Temp 97.4°F | Resp 18 | Ht 67.0 in | Wt 185.0 lb

## 2016-03-21 DIAGNOSIS — M1000 Idiopathic gout, unspecified site: Secondary | ICD-10-CM | POA: Diagnosis not present

## 2016-03-21 MED ORDER — PREDNISONE 20 MG PO TABS: ORAL_TABLET | ORAL | 0 refills | Status: DC

## 2016-03-21 NOTE — Progress Notes (Signed)
Subjective:    Patient ID: [name], male    DOB: November 01, 1928, 82 y.o.   MRN: [id number]  HPI  02/29/16 Please see my last office visit.  Patient is here today to recheck the wound on his leg. Since his last office visit he has gained 4 pounds. He now has +2 pitting edema to his mid shin. The very shallow ulcer on his right shin is weeping serous fluid through the Tegaderm. He denies any chest pain or shortness of breath although he does have right basilar crackles. There is no JVD. He is supposed to be on Lasix 20 mg twice daily. Patient is unable to tell me for sure if that's what he is taking. He states he's taking everything on the list but in all honesty he is not sure what medicines are on the list. His wife manages his medications.  At that time, my plan was: Patient seems slightly fluid overloaded on exam today. Increase Lasix to 40 mg twice daily and recheck on Monday. The small shallow ulcer on his right shin was covered with a Band-Aid and the patient was placed back in his compression stockings. Recheck here on Monday to reassess fluid status  03/04/16 Patient has only lost 2 pounds since last week. He still has +2 pitting edema in both legs. He still has faint bibasilar crackles. However after I questioned the patient further, he is only taking 40 mg once a day. I want him to take 40 mg twice a day. Patient became confused and only take it once a day.  At that time, my plan was: Increase Lasix to 40 mg twice daily and recheck in one week  03/11/16 Patient actually came by my office to August 4 and had gained an additional 3 pounds up to 195. He was adamant that he was taking Lasix 40 mg twice a day. Although he never brings his pill bottles and he is never able to take the dose until you tell him what dose she want him on. This raises the concern that his dose could be incorrect at home. However on Friday I instructed him to take 80 mg of Lasix twice a day and then recheck today. He  has lost 4 pounds since his last office visit. His lungs sound clear. He states that he feels better. He still has +1 pitting edema to his knees and +2 bipedal edema in the feet.  At that time, my plan was: I will check a BMP to monitor his potassium and renal function. I would like to continue the higher dose diuretic as long as it is not impacting his renal function or his potassium. Patient will continue 80 mg twice a day until I call him with results of lab work  03/21/16 Patient's renal function had actually improved on the higher dose of Lasix. Therefore I made the decision to continue him on Lasix 80 mg twice daily as a maintenance dose. Since I last saw the patient, his weight has remained stable. He was 186 10 days ago, he is 185 today. Unfortunately he is developed a gout attack in his right foot in the first MTP joint area the joint is red hot swollen and painful. This is likely due to increased diuretics and possibly intravascular volume depletion. Past Medical History:  Diagnosis Date  . Chronic diastolic (congestive) heart failure ([place])    a. 10/2013 Echo: EF 55-60%, basal-mid anteroseptal and basal-mid inferoseptal HK.  . Chronic fatigue   .  Chronic subdural hematoma ([place])    a. 12/2015 Head CT: chronic right holo-hemispheric SDH w/o midline shift.  . Coronary artery disease    a. 11/2014 NSTEMI: DESx 2 placed to LAD and RCA; b. 08/2015 Cath: LAD patent stent, LCX 20m, RCA patent stent, 53m.  . Diabetes mellitus, type II ([place])    a. HgA1c 6.8 in 03/2015  . ED (erectile dysfunction)   . Gout   . Hemangioma of liver 08/02/2015   12 mm enhancing lesion seen on CT - suspicious for benign hemangioma but not diagnostic - MRI recommended  . HLD (hyperlipidemia)   . Hypertensive heart disease   . Kidney stones   . Lower extremity edema    a. chronic  . OA (osteoarthritis)   . Orthostatic hypotension   . PAF (paroxysmal atrial fibrillation) ([place])    a. 10/2015 s/p DCCV;  b. CHA2DS2VASc =  6--> on coumadin; c. 10/2015 Echo: sev dil LA.  Marland Kitchen Physical deconditioning   . Prostate cancer ([place])   . Pulmonary HTN ([place])   . Pulmonary hypertension ([place])    a. 10/2015 Echo: PASP 57mmHg.  Marland Kitchen Severe mitral regurgitation    a.  10/2015 s/p minimally invasive MV repair; b. 10/2015 Echo: mod MS, mean grad 19mmHg.  Marland Kitchen Spinal stenosis   . SVT (supraventricular tachycardia) ([facility])    a. recurrent, usually responsive to vagal manuevers  . Tricuspid regurgitation    a. 10/2015 Echo: mild to mod TR.   Past Surgical History:  Procedure Laterality Date  . BACK SURGERY    . Back surgery x 2    . CARDIAC CATHETERIZATION N/A 08/08/2015   Procedure: Right/Left Heart Cath and Coronary Angiography;  Surgeon: Sherren Mocha, MD;  Location: [place] [facility] LAB;  Service: Cardiovascular;  Laterality: N/A;  . CARDIAC SURGERY     2 cardiac stents  . CARDIOVERSION N/A 10/20/2015   Procedure: CARDIOVERSION;  Surgeon: Pixie Casino, MD;  Location: [place];  Service: Cardiovascular;  Laterality: N/A;  . CERVICAL SPINE SURGERY    . EYE SURGERY    . INTRAOPERATIVE TRANSESOPHAGEAL ECHOCARDIOGRAM  10/05/2015   Procedure: INTRAOPERATIVE TRANSESOPHAGEAL ECHOCARDIOGRAM;  Surgeon: Rexene Alberts, MD;  Location: [facility] OR;  Service: Open Heart Surgery;;  . JOINT REPLACEMENT     shoulder  . LEFT HEART CATHETERIZATION WITH CORONARY ANGIOGRAM N/A 11/16/2014   Procedure: LEFT HEART CATHETERIZATION WITH CORONARY ANGIOGRAM;  Surgeon: Troy Sine, MD;  Location: [facility] CATH LAB;  Service: Cardiovascular;  Laterality: N/A;  . MITRAL VALVE REPAIR Right 10/04/2015   Procedure: MINIMALLY INVASIVE MITRAL VALVE REPAIR (MVR);  Surgeon: Rexene Alberts, MD;  Location: [place];  Service: Open Heart Surgery;  Laterality: Right;  . Rt rotator cuff repair    . TEE WITHOUT CARDIOVERSION N/A 06/22/2015   Procedure: TRANSESOPHAGEAL ECHOCARDIOGRAM (TEE);  Surgeon: Skeet Latch, MD;  Location: [place];  Service: Cardiovascular;  Laterality:  N/A;  . TEE WITHOUT CARDIOVERSION N/A 10/04/2015   Procedure: TRANSESOPHAGEAL ECHOCARDIOGRAM (TEE);  Surgeon: Rexene Alberts, MD;  Location: [place];  Service: Open Heart Surgery;  Laterality: N/A;  . WOUND EXPLORATION N/A 10/05/2015   Procedure: Sternal Incision;  Surgeon: Rexene Alberts, MD;  Location: [place];  Service: Open Heart Surgery;  Laterality: N/A;   Current Outpatient Prescriptions on File Prior to Visit  Medication Sig Dispense Refill  . aspirin EC 81 MG tablet Take 81 mg by mouth every other day. Reported on 12/29/2015    . finasteride (PROSCAR) 5  MG tablet TAKE ONE (1) TABLET EACH DAY 30 tablet 3  . fluticasone (FLONASE) 50 MCG/ACT nasal spray Place 1 spray into both nostrils daily as needed for allergies or rhinitis.    . furosemide (LASIX) 40 MG tablet Take 80 mg by mouth 2 (two) times daily.     . metoprolol tartrate (LOPRESSOR) 25 MG tablet Take 0.5 tablets (12.5 mg total) by mouth 2 (two) times daily. 60 tablet 3  . ondansetron (ZOFRAN) 4 MG tablet Take 1 tablet (4 mg total) by mouth every 8 (eight) hours as needed for nausea or vomiting. 20 tablet 0  . potassium chloride SA (K-DUR,KLOR-CON) 20 MEQ tablet Take 1 tablet (20 mEq total) by mouth daily. 30 tablet 1  . pravastatin (PRAVACHOL) 40 MG tablet Take 1 tablet (40 mg total) by mouth daily at 6 PM. 30 tablet 11  . traMADol (ULTRAM) 50 MG tablet Take 1 tablet (50 mg total) by mouth every 6 (six) hours as needed for moderate pain. 30 tablet 0  . warfarin (COUMADIN) 2 MG tablet Take 1-2 tablets daily or as directed by coumadin clinic 45 tablet 1   No current facility-administered medications on file prior to visit.    No Known Allergies Social History   Social History  . Marital status: Married    Spouse name: N/A  . Number of children: 4  . Years of education: N/A   Occupational History  .  Retired   Social History Main Topics  . Smoking status: Former Research scientist (life sciences)  . Smokeless tobacco: Never Used     Comment: QUIT  SMOKING  MANY YEARS AGO"  . Alcohol use No  . Drug use: No  . Sexual activity: Not on file   Other Topics Concern  . Not on file   Social History Narrative   ** Merged History Encounter **       Four adopted children.  Lives alone.      Review of Systems  All other systems reviewed and are negative.      Objective:   Physical Exam  Neck: Neck supple. No JVD present.  Cardiovascular: Normal rate and regular rhythm.   Murmur heard. Pulmonary/Chest: Effort normal. He has no wheezes. He has no rales.  Abdominal: Soft. Bowel sounds are normal.  Musculoskeletal: He exhibits no edema.  Lymphadenopathy:    He has no cervical adenopathy.  Neurological: He has normal reflexes. No cranial nerve deficit. He exhibits normal muscle tone.  Vitals reviewed.    Wt Readings from Last 3 Encounters:  03/21/16 185 lb (83.9 kg)  03/11/16 186 lb (84.4 kg)  03/04/16 190 lb (86.2 kg)        Assessment & Plan:  Acute idiopathic gout, unspecified site - Plan: predniSONE (DELTASONE) 20 MG tablet, BASIC METABOLIC PANEL WITH GFR Begin a prednisone taper pack for gout attack. The higher dose of Lasix seems to maintain his fluid status. His weight is stable. He has trace bipedal edema which is much improved from his last few visits. Unfortunately he may have intravascular depletion triggering his gout attack. I will recheck his renal function today and if his renal function has declined, we may need to decrease his Lasix dose even though clinically he seems to be doing better on the higher dose. I will await his renal function prior to making that determination

## 2016-03-22 LAB — BASIC METABOLIC PANEL WITH GFR
BUN: 20 mg/dL (ref 7–25)
CALCIUM: 9.2 mg/dL (ref 8.6–10.3)
CO2: 27 mmol/L (ref 20–31)
Chloride: 98 mmol/L (ref 98–110)
Creat: 1.16 mg/dL — ABNORMAL HIGH (ref 0.70–1.11)
GFR, EST AFRICAN AMERICAN: 66 mL/min (ref >=60)
GFR, EST NON AFRICAN AMERICAN: 58 mL/min — AB (ref >=60)
Glucose, Bld: 112 mg/dL — ABNORMAL HIGH (ref 70–99)
Potassium: 3.7 mmol/L (ref 3.5–5.3)
SODIUM: 139 mmol/L (ref 135–146)

## 2016-04-03 ENCOUNTER — Ambulatory Visit (INDEPENDENT_AMBULATORY_CARE_PROVIDER_SITE_OTHER): Payer: Medicare Other | Admitting: *Deleted

## 2016-04-03 DIAGNOSIS — Z7901 Long term (current) use of anticoagulants: Secondary | ICD-10-CM

## 2016-04-03 DIAGNOSIS — Z9889 Other specified postprocedural states: Secondary | ICD-10-CM

## 2016-04-03 LAB — POCT INR: INR: 1.7

## 2016-04-10 ENCOUNTER — Other Ambulatory Visit: Payer: Self-pay | Admitting: *Deleted

## 2016-04-10 ENCOUNTER — Other Ambulatory Visit: Payer: Self-pay | Admitting: Family Medicine

## 2016-04-11 MED ORDER — WARFARIN SODIUM 2 MG PO TABS: ORAL_TABLET | ORAL | 1 refills | Status: DC

## 2016-04-12 ENCOUNTER — Encounter: Payer: Self-pay | Admitting: Family Medicine

## 2016-04-12 ENCOUNTER — Other Ambulatory Visit: Payer: Self-pay | Admitting: Pharmacist

## 2016-04-12 ENCOUNTER — Ambulatory Visit (INDEPENDENT_AMBULATORY_CARE_PROVIDER_SITE_OTHER): Payer: Medicare Other | Admitting: Family Medicine

## 2016-04-12 VITALS — BP 100/60 | HR 62 | Temp 98.2°F | Resp 18 | Ht 67.0 in | Wt 195.0 lb

## 2016-04-12 DIAGNOSIS — M25562 Pain in left knee: Secondary | ICD-10-CM | POA: Diagnosis not present

## 2016-04-12 DIAGNOSIS — M25561 Pain in right knee: Secondary | ICD-10-CM | POA: Diagnosis not present

## 2016-04-12 DIAGNOSIS — R29898 Other symptoms and signs involving the musculoskeletal system: Secondary | ICD-10-CM | POA: Diagnosis not present

## 2016-04-12 DIAGNOSIS — M6281 Muscle weakness (generalized): Secondary | ICD-10-CM | POA: Diagnosis not present

## 2016-04-12 MED ORDER — WARFARIN SODIUM 2 MG PO TABS: ORAL_TABLET | ORAL | 1 refills | Status: DC

## 2016-04-15 NOTE — Progress Notes (Signed)
Subjective:    Patient ID: [name], male    DOB: 15-Mar-1929, 82 y.o.   MRN: [id number]  HPI  Patient has a history of osteoarthritis in both knees. His pain has been relatively well controlled after he received his last cortisone injection in his right knee. However he continues to have weakness in his knees. He states that with prolonged standing, his knees will give out on him. At times he feels like he may fall. He is tried wearing a neoprene knee sleeve that this has an almost tourniquet-like effect on his legs due to his edema causing significant pain. He is interested in an adjustable brace will help provide relief from his knee pain and leg weakness but not cause pain due to restriction with changing levels of edema Past Medical History:  Diagnosis Date  . Chronic diastolic (congestive) heart failure ([place])    a. 10/2013 Echo: EF 55-60%, basal-mid anteroseptal and basal-mid inferoseptal HK.  . Chronic fatigue   . Chronic subdural hematoma ([place])    a. 12/2015 Head CT: chronic right holo-hemispheric SDH w/o midline shift.  . Coronary artery disease    a. 11/2014 NSTEMI: DESx 2 placed to LAD and RCA; b. 08/2015 Cath: LAD patent stent, LCX 62m, RCA patent stent, 45m.  . Diabetes mellitus, type II ([place])    a. HgA1c 6.8 in 03/2015  . ED (erectile dysfunction)   . Gout   . Hemangioma of liver 08/02/2015   12 mm enhancing lesion seen on CT - suspicious for benign hemangioma but not diagnostic - MRI recommended  . HLD (hyperlipidemia)   . Hypertensive heart disease   . Kidney stones   . Lower extremity edema    a. chronic  . OA (osteoarthritis)   . Orthostatic hypotension   . PAF (paroxysmal atrial fibrillation) ([place])    a. 10/2015 s/p DCCV;  b. CHA2DS2VASc = 6--> on coumadin; c. 10/2015 Echo: sev dil LA.  Marland Kitchen Physical deconditioning   . Prostate cancer ([place])   . Pulmonary HTN ([place])   . Pulmonary hypertension ([place])    a. 10/2015 Echo: PASP 104mmHg.  Marland Kitchen Severe mitral regurgitation    a.  10/2015 s/p minimally invasive MV repair; b. 10/2015 Echo: mod MS, mean grad 38mmHg.  Marland Kitchen Spinal stenosis   . SVT (supraventricular tachycardia) ([facility])    a. recurrent, usually responsive to vagal manuevers  . Tricuspid regurgitation    a. 10/2015 Echo: mild to mod TR.   Past Surgical History:  Procedure Laterality Date  . BACK SURGERY    . Back surgery x 2    . CARDIAC CATHETERIZATION N/A 08/08/2015   Procedure: Right/Left Heart Cath and Coronary Angiography;  Surgeon: Sherren Mocha, MD;  Location: [facility];  Service: Cardiovascular;  Laterality: N/A;  . CARDIAC SURGERY     2 cardiac stents  . CARDIOVERSION N/A 10/20/2015   Procedure: CARDIOVERSION;  Surgeon: Pixie Casino, MD;  Location: [facility];  Service: Cardiovascular;  Laterality: N/A;  . CERVICAL SPINE SURGERY    . EYE SURGERY    . INTRAOPERATIVE TRANSESOPHAGEAL ECHOCARDIOGRAM  10/05/2015   Procedure: INTRAOPERATIVE TRANSESOPHAGEAL ECHOCARDIOGRAM;  Surgeon: Rexene Alberts, MD;  Location: [facility];  Service: Open Heart Surgery;;  . JOINT REPLACEMENT     shoulder  . LEFT HEART CATHETERIZATION WITH CORONARY ANGIOGRAM N/A 11/16/2014   Procedure: LEFT HEART CATHETERIZATION WITH CORONARY ANGIOGRAM;  Surgeon: Troy Sine, MD;  Location: [facility] CATH LAB;  Service: Cardiovascular;  Laterality: N/A;  .  MITRAL VALVE REPAIR Right 10/04/2015   Procedure: MINIMALLY INVASIVE MITRAL VALVE REPAIR (MVR);  Surgeon: Rexene Alberts, MD;  Location: [facility];  Service: Open Heart Surgery;  Laterality: Right;  . Rt rotator cuff repair    . TEE WITHOUT CARDIOVERSION N/A 06/22/2015   Procedure: TRANSESOPHAGEAL ECHOCARDIOGRAM (TEE);  Surgeon: Skeet Latch, MD;  Location: [place];  Service: Cardiovascular;  Laterality: N/A;  . TEE WITHOUT CARDIOVERSION N/A 10/04/2015   Procedure: TRANSESOPHAGEAL ECHOCARDIOGRAM (TEE);  Surgeon: Rexene Alberts, MD;  Location: [place];  Service: Open Heart Surgery;  Laterality: N/A;  . WOUND EXPLORATION N/A 10/05/2015     Procedure: Sternal Incision;  Surgeon: Rexene Alberts, MD;  Location: [place];  Service: Open Heart Surgery;  Laterality: N/A;   Current Outpatient Prescriptions on File Prior to Visit  Medication Sig Dispense Refill  . aspirin EC 81 MG tablet Take 81 mg by mouth every other day. Reported on 12/29/2015    . finasteride (PROSCAR) 5 MG tablet TAKE ONE (1) TABLET EACH DAY 30 tablet 11  . fluticasone (FLONASE) 50 MCG/ACT nasal spray Place 1 spray into both nostrils daily as needed for allergies or rhinitis.    . furosemide (LASIX) 40 MG tablet Take 80 mg by mouth 2 (two) times daily.     . metoprolol tartrate (LOPRESSOR) 25 MG tablet Take 0.5 tablets (12.5 mg total) by mouth 2 (two) times daily. 60 tablet 3  . ondansetron (ZOFRAN) 4 MG tablet Take 1 tablet (4 mg total) by mouth every 8 (eight) hours as needed for nausea or vomiting. 20 tablet 0  . potassium chloride SA (K-DUR,KLOR-CON) 20 MEQ tablet Take 1 tablet (20 mEq total) by mouth daily. 30 tablet 1  . pravastatin (PRAVACHOL) 40 MG tablet Take 1 tablet (40 mg total) by mouth daily at 6 PM. 30 tablet 11  . traMADol (ULTRAM) 50 MG tablet Take 1 tablet (50 mg total) by mouth every 6 (six) hours as needed for moderate pain. 30 tablet 0  . warfarin (COUMADIN) 2 MG tablet Take 1-2 tablets daily or as directed by coumadin clinic 60 tablet 1   No current facility-administered medications on file prior to visit.    No Known Allergies Social History   Social History  . Marital status: Married    Spouse name: N/A  . Number of children: 4  . Years of education: N/A   Occupational History  .  Retired   Social History Main Topics  . Smoking status: Former Research scientist (life sciences)  . Smokeless tobacco: Never Used     Comment: QUIT SMOKING  MANY YEARS AGO"  . Alcohol use No  . Drug use: No  . Sexual activity: Not on file   Other Topics Concern  . Not on file   Social History Narrative   ** Merged History Encounter **       Four adopted children.  Lives  alone.      Review of Systems  All other systems reviewed and are negative.      Objective:   Physical Exam  Cardiovascular: Normal rate.   Murmur heard. Pulmonary/Chest: Effort normal and breath sounds normal. No respiratory distress. He has no wheezes. He has no rales.  Musculoskeletal: He exhibits edema.       Right knee: He exhibits decreased range of motion and swelling. Tenderness found. Medial joint line and lateral joint line tenderness noted.       Left knee: He exhibits decreased range of motion and swelling. Tenderness  found. Medial joint line and lateral joint line tenderness noted.  Vitals reviewed.         Assessment & Plan:  Knee pain, bilateral  Leg weakness, bilateral  I recommended that the patient wear a hinged knee brace on both knees as needed for knee pain or leg weakness. The adjustable feature will help prevent some of the pain due to changing levels of edema.

## 2016-04-17 ENCOUNTER — Ambulatory Visit (INDEPENDENT_AMBULATORY_CARE_PROVIDER_SITE_OTHER): Payer: Medicare Other | Admitting: *Deleted

## 2016-04-17 DIAGNOSIS — Z9889 Other specified postprocedural states: Secondary | ICD-10-CM

## 2016-04-17 DIAGNOSIS — Z7901 Long term (current) use of anticoagulants: Secondary | ICD-10-CM | POA: Diagnosis not present

## 2016-04-17 LAB — POCT INR: INR: 1.8

## 2016-04-22 ENCOUNTER — Other Ambulatory Visit: Payer: Self-pay | Admitting: Family Medicine

## 2016-04-22 MED ORDER — FUROSEMIDE 40 MG PO TABS: 80.0000 mg | ORAL_TABLET | Freq: Two times a day (BID) | ORAL | 3 refills | Status: DC

## 2016-05-01 ENCOUNTER — Ambulatory Visit (INDEPENDENT_AMBULATORY_CARE_PROVIDER_SITE_OTHER): Payer: Medicare Other

## 2016-05-01 DIAGNOSIS — Z7901 Long term (current) use of anticoagulants: Secondary | ICD-10-CM

## 2016-05-01 DIAGNOSIS — Z9889 Other specified postprocedural states: Secondary | ICD-10-CM

## 2016-05-01 LAB — POCT INR: INR: 1.9

## 2016-05-14 ENCOUNTER — Encounter: Payer: Self-pay | Admitting: Family Medicine

## 2016-05-14 ENCOUNTER — Ambulatory Visit (INDEPENDENT_AMBULATORY_CARE_PROVIDER_SITE_OTHER): Payer: Medicare Other | Admitting: Family Medicine

## 2016-05-14 VITALS — BP 140/74 | HR 80 | Temp 98.1°F | Resp 18 | Ht 67.0 in | Wt 196.0 lb

## 2016-05-14 DIAGNOSIS — R600 Localized edema: Secondary | ICD-10-CM | POA: Diagnosis not present

## 2016-05-14 NOTE — Progress Notes (Signed)
Subjective:    Patient ID: [name], male    DOB: [birth year], 82 y.o.   MRN: [id number]  HPI  02/29/16 Please see my last office visit.  Patient is here today to recheck the wound on his leg. Since his last office visit he has gained 4 pounds. He now has +2 pitting edema to his mid shin. The very shallow ulcer on his right shin is weeping serous fluid through the Tegaderm. He denies any chest pain or shortness of breath although he does have right basilar crackles. There is no JVD. He is supposed to be on Lasix 20 mg twice daily. Patient is unable to tell me for sure if that's what he is taking. He states he's taking everything on the list but in all honesty he is not sure what medicines are on the list. His wife manages his medications.  At that time, my plan was: Patient seems slightly fluid overloaded on exam today. Increase Lasix to 40 mg twice daily and recheck on Monday. The small shallow ulcer on his right shin was covered with a Band-Aid and the patient was placed back in his compression stockings. Recheck here on Monday to reassess fluid status  03/04/16 Patient has only lost 2 pounds since last week. He still has +2 pitting edema in both legs. He still has faint bibasilar crackles. However after I questioned the patient further, he is only taking 40 mg once a day. I want him to take 40 mg twice a day. Patient became confused and only take it once a day.  At that time, my plan was: Increase Lasix to 40 mg twice daily and recheck in one week  03/11/16 Patient actually came by my office to August 4 and had gained an additional 3 pounds up to 195. He was adamant that he was taking Lasix 40 mg twice a day. Although he never brings his pill bottles and he is never able to take the dose until you tell him what dose she want him on. This raises the concern that his dose could be incorrect at home. However on Friday I instructed him to take 80 mg of Lasix twice a day and then recheck today. He  has lost 4 pounds since his last office visit. His lungs sound clear. He states that he feels better. He still has +1 pitting edema to his knees and +2 bipedal edema in the feet.  At that time, my plan was: I will check a BMP to monitor his potassium and renal function. I would like to continue the higher dose diuretic as long as it is not impacting his renal function or his potassium. Patient will continue 80 mg twice a day until I call him with results of lab work  05/14/16 Patient had done well on 80 mg twice daily until recently when the edema worsened in his right leg. He now has +2 pitting edema in his right leg to his knee even wearing his compression stocking. He has +1 edema in his left leg to his knee. His weight is up significantly from his visit in August. He denies any chest pain or shortness of breath. Wt Readings from Last 3 Encounters:  05/14/16 196 lb (88.9 kg)  04/12/16 195 lb (88.5 kg)  03/21/16 185 lb (83.9 kg)    Past Medical History:  Diagnosis Date  . Chronic diastolic (congestive) heart failure    a. 10/2013 Echo: EF 55-60%, basal-mid anteroseptal and basal-mid inferoseptal HK.  Marland Kitchen  Chronic fatigue   . Chronic subdural hematoma ([place])    a. 12/2015 Head CT: chronic right holo-hemispheric SDH w/o midline shift.  . Coronary artery disease    a. 11/2014 NSTEMI: DESx 2 placed to LAD and RCA; b. 08/2015 Cath: LAD patent stent, LCX 77m, RCA patent stent, 62m.  . Diabetes mellitus, type II ([place])    a. HgA1c 6.8 in 03/2015  . ED (erectile dysfunction)   . Gout   . Hemangioma of liver 08/02/2015   12 mm enhancing lesion seen on CT - suspicious for benign hemangioma but not diagnostic - MRI recommended  . HLD (hyperlipidemia)   . Hypertensive heart disease   . Kidney stones   . Lower extremity edema    a. chronic  . OA (osteoarthritis)   . Orthostatic hypotension   . PAF (paroxysmal atrial fibrillation) ([place])    a. 10/2015 s/p DCCV;  b. CHA2DS2VASc = 6--> on coumadin; c. 10/2015  Echo: sev dil LA.  Marland Kitchen Physical deconditioning   . Prostate cancer ([place])   . Pulmonary HTN   . Pulmonary hypertension    a. 10/2015 Echo: PASP 52mmHg.  Marland Kitchen Severe mitral regurgitation    a.  10/2015 s/p minimally invasive MV repair; b. 10/2015 Echo: mod MS, mean grad 21mmHg.  Marland Kitchen Spinal stenosis   . SVT (supraventricular tachycardia) ([facility])    a. recurrent, usually responsive to vagal manuevers  . Tricuspid regurgitation    a. 10/2015 Echo: mild to mod TR.   Past Surgical History:  Procedure Laterality Date  . BACK SURGERY    . Back surgery x 2    . CARDIAC CATHETERIZATION N/A 08/08/2015   Procedure: Right/Left Heart Cath and Coronary Angiography;  Surgeon: Sherren Mocha, MD;  Location: [place] [facility];  Service: Cardiovascular;  Laterality: N/A;  . CARDIAC SURGERY     2 cardiac stents  . CARDIOVERSION N/A 10/20/2015   Procedure: CARDIOVERSION;  Surgeon: Pixie Casino, MD;  Location: [facility];  Service: Cardiovascular;  Laterality: N/A;  . CERVICAL SPINE SURGERY    . EYE SURGERY    . INTRAOPERATIVE TRANSESOPHAGEAL ECHOCARDIOGRAM  10/05/2015   Procedure: INTRAOPERATIVE TRANSESOPHAGEAL ECHOCARDIOGRAM;  Surgeon: Rexene Alberts, MD;  Location: [facility] OR;  Service: Open Heart Surgery;;  . JOINT REPLACEMENT     shoulder  . LEFT HEART CATHETERIZATION WITH CORONARY ANGIOGRAM N/A 11/16/2014   Procedure: LEFT HEART CATHETERIZATION WITH CORONARY ANGIOGRAM;  Surgeon: Troy Sine, MD;  Location: [facility] CATH LAB;  Service: Cardiovascular;  Laterality: N/A;  . MITRAL VALVE REPAIR Right 10/04/2015   Procedure: MINIMALLY INVASIVE MITRAL VALVE REPAIR (MVR);  Surgeon: Rexene Alberts, MD;  Location: [place];  Service: Open Heart Surgery;  Laterality: Right;  . Rt rotator cuff repair    . TEE WITHOUT CARDIOVERSION N/A 06/22/2015   Procedure: TRANSESOPHAGEAL ECHOCARDIOGRAM (TEE);  Surgeon: Skeet Latch, MD;  Location: [place];  Service: Cardiovascular;  Laterality: N/A;  . TEE WITHOUT CARDIOVERSION N/A  10/04/2015   Procedure: TRANSESOPHAGEAL ECHOCARDIOGRAM (TEE);  Surgeon: Rexene Alberts, MD;  Location: [place];  Service: Open Heart Surgery;  Laterality: N/A;  . WOUND EXPLORATION N/A 10/05/2015   Procedure: Sternal Incision;  Surgeon: Rexene Alberts, MD;  Location: [place];  Service: Open Heart Surgery;  Laterality: N/A;   Current Outpatient Prescriptions on File Prior to Visit  Medication Sig Dispense Refill  . aspirin EC 81 MG tablet Take 81 mg by mouth every other day. Reported on 12/29/2015    .  finasteride (PROSCAR) 5 MG tablet TAKE ONE (1) TABLET EACH DAY 30 tablet 11  . fluticasone (FLONASE) 50 MCG/ACT nasal spray Place 1 spray into both nostrils daily as needed for allergies or rhinitis.    . furosemide (LASIX) 40 MG tablet Take 2 tablets (80 mg total) by mouth 2 (two) times daily. 60 tablet 3  . metoprolol tartrate (LOPRESSOR) 25 MG tablet Take 0.5 tablets (12.5 mg total) by mouth 2 (two) times daily. 60 tablet 3  . ondansetron (ZOFRAN) 4 MG tablet Take 1 tablet (4 mg total) by mouth every 8 (eight) hours as needed for nausea or vomiting. 20 tablet 0  . potassium chloride SA (K-DUR,KLOR-CON) 20 MEQ tablet Take 1 tablet (20 mEq total) by mouth daily. 30 tablet 1  . pravastatin (PRAVACHOL) 40 MG tablet Take 1 tablet (40 mg total) by mouth daily at 6 PM. 30 tablet 11  . traMADol (ULTRAM) 50 MG tablet Take 1 tablet (50 mg total) by mouth every 6 (six) hours as needed for moderate pain. 30 tablet 0  . warfarin (COUMADIN) 2 MG tablet Take 1-2 tablets daily or as directed by coumadin clinic 60 tablet 1   No current facility-administered medications on file prior to visit.    No Known Allergies Social History   Social History  . Marital status: Married    Spouse name: N/A  . Number of children: 4  . Years of education: N/A   Occupational History  .  Retired   Social History Main Topics  . Smoking status: Former Research scientist (life sciences)  . Smokeless tobacco: Never Used     Comment: QUIT SMOKING  MANY  YEARS AGO"  . Alcohol use No  . Drug use: No  . Sexual activity: Not on file   Other Topics Concern  . Not on file   Social History Narrative   ** Merged History Encounter **       Four adopted children.  Lives alone.      Review of Systems  All other systems reviewed and are negative.      Objective:   Physical Exam  Neck: Neck supple. No JVD present.  Cardiovascular: Normal rate and regular rhythm.   Murmur heard. Pulmonary/Chest: Effort normal. He has no wheezes. He has no rales.  Abdominal: Soft. Bowel sounds are normal.  Musculoskeletal: He exhibits edema.  Lymphadenopathy:    He has no cervical adenopathy.  Neurological: He has normal reflexes. No cranial nerve deficit. He exhibits normal muscle tone.  Vitals reviewed.    Wt Readings from Last 3 Encounters:  05/14/16 196 lb (88.9 kg)  04/12/16 195 lb (88.5 kg)  03/21/16 185 lb (83.9 kg)        Assessment & Plan:  Localized edema  Bilateral edema of lower extremity  Increase Lasix to 120 mg by mouth twice a day and recheck on Friday

## 2016-05-20 ENCOUNTER — Telehealth: Payer: Self-pay | Admitting: Family Medicine

## 2016-05-20 NOTE — Telephone Encounter (Signed)
LMTRC

## 2016-05-20 NOTE — Telephone Encounter (Signed)
Patient calling to say that his legs are some better but not all the way please call and advise him what he should do  [phone]

## 2016-05-20 NOTE — Telephone Encounter (Signed)
Come in Thursday also for blood in sperm

## 2016-05-22 ENCOUNTER — Ambulatory Visit (INDEPENDENT_AMBULATORY_CARE_PROVIDER_SITE_OTHER): Payer: Medicare Other | Admitting: *Deleted

## 2016-05-22 DIAGNOSIS — Z7901 Long term (current) use of anticoagulants: Secondary | ICD-10-CM | POA: Diagnosis not present

## 2016-05-22 DIAGNOSIS — Z9889 Other specified postprocedural states: Secondary | ICD-10-CM

## 2016-05-22 LAB — POCT INR: INR: 1.8

## 2016-05-27 NOTE — Telephone Encounter (Signed)
Pt called and appt  made 

## 2016-05-28 ENCOUNTER — Encounter: Payer: Self-pay | Admitting: Family Medicine

## 2016-05-28 ENCOUNTER — Ambulatory Visit (INDEPENDENT_AMBULATORY_CARE_PROVIDER_SITE_OTHER): Payer: Medicare Other | Admitting: Family Medicine

## 2016-05-28 VITALS — BP 130/60 | HR 60 | Temp 97.9°F | Resp 16 | Ht 67.0 in | Wt 195.0 lb

## 2016-05-28 DIAGNOSIS — R600 Localized edema: Secondary | ICD-10-CM

## 2016-05-28 DIAGNOSIS — R319 Hematuria, unspecified: Secondary | ICD-10-CM

## 2016-05-28 LAB — URINALYSIS, ROUTINE W REFLEX MICROSCOPIC
BILIRUBIN URINE: NEGATIVE
GLUCOSE, UA: NEGATIVE
KETONES UR: NEGATIVE
Nitrite: NEGATIVE
PROTEIN: NEGATIVE
Specific Gravity, Urine: 1.015 (ref 1.001–1.035)
pH: 7 (ref 5.0–8.0)

## 2016-05-28 LAB — URINALYSIS, MICROSCOPIC ONLY
CASTS: NONE SEEN [LPF]
CRYSTALS: NONE SEEN [HPF]
YEAST: NONE SEEN [HPF]

## 2016-05-28 LAB — PT WITH INR/FINGERSTICK
INR, fingerstick: 2.2 — ABNORMAL HIGH (ref 0.80–1.20)
PT FINGERSTICK: 26.6 s — AB (ref 10.4–12.5)

## 2016-05-28 NOTE — Progress Notes (Signed)
Subjective:    Patient ID: [name], male    DOB: 15-Aug-1928, 82 y.o.   MRN: [id number]  HPI  02/29/16 Please see my last office visit.  Patient is here today to recheck the wound on his leg. Since his last office visit he has gained 4 pounds. He now has +2 pitting edema to his mid shin. The very shallow ulcer on his right shin is weeping serous fluid through the Tegaderm. He denies any chest pain or shortness of breath although he does have right basilar crackles. There is no JVD. He is supposed to be on Lasix 20 mg twice daily. Patient is unable to tell me for sure if that's what he is taking. He states he's taking everything on the list but in all honesty he is not sure what medicines are on the list. His wife manages his medications.  At that time, my plan was: Patient seems slightly fluid overloaded on exam today. Increase Lasix to 40 mg twice daily and recheck on Monday. The small shallow ulcer on his right shin was covered with a Band-Aid and the patient was placed back in his compression stockings. Recheck here on Monday to reassess fluid status  03/04/16 Patient has only lost 2 pounds since last week. He still has +2 pitting edema in both legs. He still has faint bibasilar crackles. However after I questioned the patient further, he is only taking 40 mg once a day. I want him to take 40 mg twice a day. Patient became confused and only take it once a day.  At that time, my plan was: Increase Lasix to 40 mg twice daily and recheck in one week  03/11/16 Patient actually came by my office to August 4 and had gained an additional 3 pounds up to 195. He was adamant that he was taking Lasix 40 mg twice a day. Although he never brings his pill bottles and he is never able to take the dose until you tell him what dose she want him on. This raises the concern that his dose could be incorrect at home. However on Friday I instructed him to take 80 mg of Lasix twice a day and then recheck today. He  has lost 4 pounds since his last office visit. His lungs sound clear. He states that he feels better. He still has +1 pitting edema to his knees and +2 bipedal edema in the feet.  At that time, my plan was: I will check a BMP to monitor his potassium and renal function. I would like to continue the higher dose diuretic as long as it is not impacting his renal function or his potassium. Patient will continue 80 mg twice a day until I call him with results of lab work  05/14/16 Patient had done well on 80 mg twice daily until recently when the edema worsened in his right leg. He now has +2 pitting edema in his right leg to his knee even wearing his compression stocking. He has +1 edema in his left leg to his knee. His weight is up significantly from his visit in August. He denies any chest pain or shortness of breath.  At that time, my plan was: Increase Lasix to 120 mg by mouth twice a day and recheck on Friday  Wt Readings from Last 3 Encounters:  05/14/16 196 lb (88.9 kg)  04/12/16 195 lb (88.5 kg)  03/21/16 185 lb (83.9 kg)    05/28/16 He is here today for follow-up  now 2 weeks later.  The patient took Lasix 120 mg by mouth twice a day for 3 days. The swelling improved. After that he reduced his Lasix dose to 100 mg (2-1/2 tablets) twice a day. He did this independently. The swelling is only slightly better than it was when I last saw him. He denies any chest pain. He denies any shortness of breath. His lungs are clear to auscultation. He does have pitting edema to his mid shin even wearing compression stockings. However there are no venous stasis ulcers. There is no skin breakdown. The edema is +1. More concerning to me, the patient has had 2 episodes of painless gross hematuria over the last 2 weeks without explanation. The patient states that he was urinating pure blood at one point. He also states that he passed a small clot. He denies any dysuria or fevers or chills. His past medical history  states that he has a history of prostate cancer. He states he was diagnosed with this more than 20 years ago and they treated it with some pills. He does not recall the name of the medication. To clarify he is having gross hematuria not hematospermia.  Past Medical History:  Diagnosis Date  . Chronic diastolic (congestive) heart failure    a. 10/2013 Echo: EF 55-60%, basal-mid anteroseptal and basal-mid inferoseptal HK.  . Chronic fatigue   . Chronic subdural hematoma ([place])    a. 12/2015 Head CT: chronic right holo-hemispheric SDH w/o midline shift.  . Coronary artery disease    a. 11/2014 NSTEMI: DESx 2 placed to LAD and RCA; b. 08/2015 Cath: LAD patent stent, LCX 11m, RCA patent stent, 50m.  . Diabetes mellitus, type II ([place])    a. HgA1c 6.8 in 03/2015  . ED (erectile dysfunction)   . Gout   . Hemangioma of liver 08/02/2015   12 mm enhancing lesion seen on CT - suspicious for benign hemangioma but not diagnostic - MRI recommended  . HLD (hyperlipidemia)   . Hypertensive heart disease   . Kidney stones   . Lower extremity edema    a. chronic  . OA (osteoarthritis)   . Orthostatic hypotension   . PAF (paroxysmal atrial fibrillation) ([place])    a. 10/2015 s/p DCCV;  b. CHA2DS2VASc = 6--> on coumadin; c. 10/2015 Echo: sev dil LA.  Marland Kitchen Physical deconditioning   . Prostate cancer ([place])   . Pulmonary HTN   . Pulmonary hypertension    a. 10/2015 Echo: PASP 82mmHg.  Marland Kitchen Severe mitral regurgitation    a.  10/2015 s/p minimally invasive MV repair; b. 10/2015 Echo: mod MS, mean grad 68mmHg.  Marland Kitchen Spinal stenosis   . SVT (supraventricular tachycardia) ([facility])    a. recurrent, usually responsive to vagal manuevers  . Tricuspid regurgitation    a. 10/2015 Echo: mild to mod TR.   Past Surgical History:  Procedure Laterality Date  . BACK SURGERY    . Back surgery x 2    . CARDIAC CATHETERIZATION N/A 08/08/2015   Procedure: Right/Left Heart Cath and Coronary Angiography;  Surgeon: Sherren Mocha, MD;  Location:  [facility];  Service: Cardiovascular;  Laterality: N/A;  . CARDIAC SURGERY     2 cardiac stents  . CARDIOVERSION N/A 10/20/2015   Procedure: CARDIOVERSION;  Surgeon: Pixie Casino, MD;  Location: [facility];  Service: Cardiovascular;  Laterality: N/A;  . CERVICAL SPINE SURGERY    . EYE SURGERY    . INTRAOPERATIVE TRANSESOPHAGEAL ECHOCARDIOGRAM  10/05/2015  Procedure: INTRAOPERATIVE TRANSESOPHAGEAL ECHOCARDIOGRAM;  Surgeon: Rexene Alberts, MD;  Location: [facility];  Service: Open Heart Surgery;;  . JOINT REPLACEMENT     shoulder  . LEFT HEART CATHETERIZATION WITH CORONARY ANGIOGRAM N/A 11/16/2014   Procedure: LEFT HEART CATHETERIZATION WITH CORONARY ANGIOGRAM;  Surgeon: Troy Sine, MD;  Location: [facility] CATH LAB;  Service: Cardiovascular;  Laterality: N/A;  . MITRAL VALVE REPAIR Right 10/04/2015   Procedure: MINIMALLY INVASIVE MITRAL VALVE REPAIR (MVR);  Surgeon: Rexene Alberts, MD;  Location: [facility];  Service: Open Heart Surgery;  Laterality: Right;  . Rt rotator cuff repair    . TEE WITHOUT CARDIOVERSION N/A 06/22/2015   Procedure: TRANSESOPHAGEAL ECHOCARDIOGRAM (TEE);  Surgeon: Skeet Latch, MD;  Location: [facility];  Service: Cardiovascular;  Laterality: N/A;  . TEE WITHOUT CARDIOVERSION N/A 10/04/2015   Procedure: TRANSESOPHAGEAL ECHOCARDIOGRAM (TEE);  Surgeon: Rexene Alberts, MD;  Location: [facility];  Service: Open Heart Surgery;  Laterality: N/A;  . WOUND EXPLORATION N/A 10/05/2015   Procedure: Sternal Incision;  Surgeon: Rexene Alberts, MD;  Location: [facility];  Service: Open Heart Surgery;  Laterality: N/A;   Current Outpatient Prescriptions on File Prior to Visit  Medication Sig Dispense Refill  . aspirin EC 81 MG tablet Take 81 mg by mouth every other day. Reported on 12/29/2015    . finasteride (PROSCAR) 5 MG tablet TAKE ONE (1) TABLET EACH DAY 30 tablet 11  . fluticasone (FLONASE) 50 MCG/ACT nasal spray Place 1 spray into both nostrils daily as needed for allergies or  rhinitis.    . furosemide (LASIX) 40 MG tablet Take 2 tablets (80 mg total) by mouth 2 (two) times daily. 60 tablet 3  . metoprolol tartrate (LOPRESSOR) 25 MG tablet Take 0.5 tablets (12.5 mg total) by mouth 2 (two) times daily. 60 tablet 3  . ondansetron (ZOFRAN) 4 MG tablet Take 1 tablet (4 mg total) by mouth every 8 (eight) hours as needed for nausea or vomiting. 20 tablet 0  . potassium chloride SA (K-DUR,KLOR-CON) 20 MEQ tablet Take 1 tablet (20 mEq total) by mouth daily. 30 tablet 1  . pravastatin (PRAVACHOL) 40 MG tablet Take 1 tablet (40 mg total) by mouth daily at 6 PM. 30 tablet 11  . traMADol (ULTRAM) 50 MG tablet Take 1 tablet (50 mg total) by mouth every 6 (six) hours as needed for moderate pain. 30 tablet 0  . warfarin (COUMADIN) 2 MG tablet Take 1-2 tablets daily or as directed by coumadin clinic 60 tablet 1   No current facility-administered medications on file prior to visit.    No Known Allergies Social History   Social History  . Marital status: Married    Spouse name: N/A  . Number of children: 4  . Years of education: N/A   Occupational History  .  Retired   Social History Main Topics  . Smoking status: Former Research scientist (life sciences)  . Smokeless tobacco: Never Used     Comment: QUIT SMOKING  MANY YEARS AGO"  . Alcohol use No  . Drug use: No  . Sexual activity: Not on file   Other Topics Concern  . Not on file   Social History Narrative   ** Merged History Encounter **       Four adopted children.  Lives alone.      Review of Systems  All other systems reviewed and are negative.      Objective:   Physical Exam  Neck: Neck supple. No JVD present.  Cardiovascular: Normal rate and regular rhythm.   Murmur heard. Pulmonary/Chest: Effort normal. He has no wheezes. He has no rales.  Abdominal: Soft. Bowel sounds are normal.  Musculoskeletal: He exhibits edema.  Lymphadenopathy:    He has no cervical adenopathy.  Neurological: He has normal reflexes. No cranial  nerve deficit. He exhibits normal muscle tone.  Vitals reviewed.    Wt Readings from Last 3 Encounters:  05/14/16 196 lb (88.9 kg)  04/12/16 195 lb (88.5 kg)  03/21/16 185 lb (83.9 kg)        Assessment & Plan:  Localized edema  Bilateral edema of lower extremity  Hematospermia  Hematuria, unspecified type - Plan: Urinalysis, Routine w reflex microscopic (not at [facility])  Patient is not having hematospermia which was the initial message that I received.  Instead he has had 2 episodes of gross hematuria. Otherwise he is doing well. While he continues to have pitting edema in both ankles, he is relatively asymptomatic from this. He denies any chest pain or shortness of breath or dyspnea on exertion. Therefore I do not have a strong indication to increase his Lasix permanently. I will have the patient remain on his current dose of Lasix which will be 80 mg twice daily rather than the 100 mg he has independently decided to take as he seems to not benefit from the slight increase.  I am more concerned by the gross hematuria and therefore I have recommended a urology consultation for cystoscopy due to concern about possible bladder cancer particularly if he is passing clots.  He is on Coumadin for his history of paroxysmal atrial fibrillation. I will also check an INR today to ensure that he is not taking supratherapeutic doses of his Coumadin.  However his most recent INR on October 18 was subtherapeutic at 1.8 and therefore I believe this is unlikely.

## 2016-06-03 ENCOUNTER — Telehealth: Payer: Self-pay | Admitting: Cardiovascular Disease

## 2016-06-03 NOTE — Telephone Encounter (Signed)
Pt called and cancelled his coumadin appointment for 11/1. He states his PCP(which is in [facility]) checked his coumadin on 10/27 and it was 2.2. Please call to let know when pt needs to schedule next appointment .

## 2016-06-03 NOTE — Telephone Encounter (Signed)
Pushed back INR check a week later to 06/12/16, pt verbalized understanding.

## 2016-06-17 ENCOUNTER — Ambulatory Visit: Payer: Self-pay | Admitting: Urology

## 2016-06-19 ENCOUNTER — Encounter: Payer: Self-pay | Admitting: Urology

## 2016-06-19 ENCOUNTER — Ambulatory Visit (INDEPENDENT_AMBULATORY_CARE_PROVIDER_SITE_OTHER): Payer: Medicare Other | Admitting: Urology

## 2016-06-19 ENCOUNTER — Ambulatory Visit (INDEPENDENT_AMBULATORY_CARE_PROVIDER_SITE_OTHER): Payer: Medicare Other

## 2016-06-19 VITALS — BP 137/76 | HR 73 | Ht 67.0 in | Wt 193.9 lb

## 2016-06-19 DIAGNOSIS — Z9889 Other specified postprocedural states: Secondary | ICD-10-CM | POA: Diagnosis not present

## 2016-06-19 DIAGNOSIS — R310 Gross hematuria: Secondary | ICD-10-CM

## 2016-06-19 DIAGNOSIS — R3129 Other microscopic hematuria: Secondary | ICD-10-CM | POA: Diagnosis not present

## 2016-06-19 DIAGNOSIS — Z7901 Long term (current) use of anticoagulants: Secondary | ICD-10-CM | POA: Diagnosis not present

## 2016-06-19 LAB — POCT INR: INR: 1.8

## 2016-06-19 NOTE — Progress Notes (Signed)
06/19/2016 2:33 PM   [name] 01-01-1929 [id number]  Referring provider: [name], MD [address] [place], [place] [address]  Chief Complaint  Patient presents with  . Hematuria    HPI: Patient is a 82 -year-old Caucasian male who presents today as a referral from their PCP, Dr. Dennard Schaumann, for painless gross hematuria with clots.    Patient doesn't have a prior history of hematuria.  He does have a prior history of nephrolithiasis and prostate cancer that was treated with "some pills."   He states it was diagnosed 30 years ago.  A previous PSA was 1.87 ng/mL in 2015.  He does not have a prior history of recurrent urinary tract infections or BPH.  He did state that he ensnared his penis in his zipper prior to the incidence of gross hematuria.    He does not have a family medical history of nephrolithiasis, malignancies of the genitourinary tract or hematuria.   Today, he is not having symptoms of frequent urination, urgency, dysuria, nocturia, incontinence, hesitancy, intermittency, straining to urinate or a weak urinary stream.    He is not experiencing any suprapubic pain, abdominal pain or flank pain.  He denies any recent fevers, chills, nausea or vomiting.   He had an abdominal ultrasound on 10/18/2015 which noted no renal abnormalities.  I have independently reviewed the films.    He is a former smoker, with a 1 ppd history.  Quit 20 years ago.  He is not exposed to secondhand smoke.  He has not worked with [employer].    PMH: Past Medical History:  Diagnosis Date  . Chronic diastolic (congestive) heart failure    a. 10/2013 Echo: EF 55-60%, basal-mid anteroseptal and basal-mid inferoseptal HK.  . Chronic fatigue   . Chronic subdural hematoma ([place] [facility])    a. 12/2015 Head CT: chronic right holo-hemispheric SDH w/o midline shift.  . Coronary artery disease    a. 11/2014 NSTEMI: DESx 2 placed to LAD and RCA; b. 08/2015 Cath: LAD patent stent, LCX  51m, RCA patent stent, 63m.  . Diabetes mellitus, type II ([place])    a. HgA1c 6.8 in 03/2015  . ED (erectile dysfunction)   . Gout   . Hemangioma of liver 08/02/2015   12 mm enhancing lesion seen on CT - suspicious for benign hemangioma but not diagnostic - MRI recommended  . HLD (hyperlipidemia)   . Hypertensive heart disease   . Kidney stones   . Lower extremity edema    a. chronic  . OA (osteoarthritis)   . Orthostatic hypotension   . PAF (paroxysmal atrial fibrillation) ([facility])    a. 10/2015 s/p DCCV;  b. CHA2DS2VASc = 6--> on coumadin; c. 10/2015 Echo: sev dil LA.  Marland Kitchen Physical deconditioning   . Prostate cancer ([facility])   . Pulmonary HTN   . Pulmonary hypertension    a. 10/2015 Echo: PASP 14mmHg.  Marland Kitchen Severe mitral regurgitation    a.  10/2015 s/p minimally invasive MV repair; b. 10/2015 Echo: mod MS, mean grad 29mmHg.  Marland Kitchen Spinal stenosis   . SVT (supraventricular tachycardia) ([facility])    a. recurrent, usually responsive to vagal manuevers  . Tricuspid regurgitation    a. 10/2015 Echo: mild to mod TR.    Surgical History: Past Surgical History:  Procedure Laterality Date  . BACK SURGERY    . Back surgery x 2    . CARDIAC CATHETERIZATION N/A 08/08/2015   Procedure: Right/Left Heart Cath and  Coronary Angiography;  Surgeon: Sherren Mocha, MD;  Location: [facility];  Service: Cardiovascular;  Laterality: N/A;  . CARDIAC SURGERY     2 cardiac stents  . CARDIOVERSION N/A 10/20/2015   Procedure: CARDIOVERSION;  Surgeon: Pixie Casino, MD;  Location: [facility];  Service: Cardiovascular;  Laterality: N/A;  . CERVICAL SPINE SURGERY    . EYE SURGERY    . INTRAOPERATIVE TRANSESOPHAGEAL ECHOCARDIOGRAM  10/05/2015   Procedure: INTRAOPERATIVE TRANSESOPHAGEAL ECHOCARDIOGRAM;  Surgeon: Rexene Alberts, MD;  Location: [facility];  Service: Open Heart Surgery;;  . JOINT REPLACEMENT     shoulder  . LEFT HEART CATHETERIZATION WITH CORONARY ANGIOGRAM N/A 11/16/2014   Procedure: LEFT HEART CATHETERIZATION  WITH CORONARY ANGIOGRAM;  Surgeon: Troy Sine, MD;  Location: [facility];  Service: Cardiovascular;  Laterality: N/A;  . MITRAL VALVE REPAIR Right 10/04/2015   Procedure: MINIMALLY INVASIVE MITRAL VALVE REPAIR (MVR);  Surgeon: Rexene Alberts, MD;  Location: [facility];  Service: Open Heart Surgery;  Laterality: Right;  . Rt rotator cuff repair    . TEE WITHOUT CARDIOVERSION N/A 06/22/2015   Procedure: TRANSESOPHAGEAL ECHOCARDIOGRAM (TEE);  Surgeon: Skeet Latch, MD;  Location: [facility];  Service: Cardiovascular;  Laterality: N/A;  . TEE WITHOUT CARDIOVERSION N/A 10/04/2015   Procedure: TRANSESOPHAGEAL ECHOCARDIOGRAM (TEE);  Surgeon: Rexene Alberts, MD;  Location: [facility];  Service: Open Heart Surgery;  Laterality: N/A;  . WOUND EXPLORATION N/A 10/05/2015   Procedure: Sternal Incision;  Surgeon: Rexene Alberts, MD;  Location: [facility];  Service: Open Heart Surgery;  Laterality: N/A;    Home Medications:    Medication List       Accurate as of 06/19/16  2:33 PM. Always use your most recent med list.          aspirin EC 81 MG tablet Take 81 mg by mouth every other day. Reported on 12/29/2015   finasteride 5 MG tablet Commonly known as:  PROSCAR TAKE ONE (1) TABLET EACH DAY   fluticasone 50 MCG/ACT nasal spray Commonly known as:  FLONASE Place 1 spray into both nostrils daily as needed for allergies or rhinitis.   furosemide 40 MG tablet Commonly known as:  LASIX Take 2 tablets (80 mg total) by mouth 2 (two) times daily.   metoprolol tartrate 25 MG tablet Commonly known as:  LOPRESSOR Take 0.5 tablets (12.5 mg total) by mouth 2 (two) times daily.   ondansetron 4 MG tablet Commonly known as:  ZOFRAN Take 1 tablet (4 mg total) by mouth every 8 (eight) hours as needed for nausea or vomiting.   potassium chloride SA 20 MEQ tablet Commonly known as:  K-DUR,KLOR-CON Take 1 tablet (20 mEq total) by mouth daily.   pravastatin 40 MG tablet Commonly known as:  PRAVACHOL Take 1  tablet (40 mg total) by mouth daily at 6 PM.   traMADol 50 MG tablet Commonly known as:  ULTRAM Take 1 tablet (50 mg total) by mouth every 6 (six) hours as needed for moderate pain.   warfarin 2 MG tablet Commonly known as:  COUMADIN Take 1-2 tablets daily or as directed by coumadin clinic       Allergies: No Known Allergies  Family History: Family History  Problem Relation Age of Onset  . Heart failure Mother 62  . Heart attack Mother   . Heart attack Brother     Social History:  reports that he has quit smoking. He has never used smokeless tobacco. He reports that  he does not drink alcohol or use drugs.  ROS: UROLOGY Frequent Urination?: No Hard to postpone urination?: No Burning/pain with urination?: No Get up at night to urinate?: No Leakage of urine?: No Urine stream starts and stops?: No Trouble starting stream?: No Do you have to strain to urinate?: No Blood in urine?: Yes Urinary tract infection?: No Sexually transmitted disease?: No Injury to kidneys or bladder?: No Painful intercourse?: No Weak stream?: No Erection problems?: No Penile pain?: No  Gastrointestinal Nausea?: No Vomiting?: No Indigestion/heartburn?: No Diarrhea?: No Constipation?: No  Constitutional Fever: No Night sweats?: No Weight loss?: No Fatigue?: No  Skin Skin rash/lesions?: No Itching?: No  Eyes Blurred vision?: No Double vision?: No  Ears/Nose/Throat Sore throat?: No Sinus problems?: No  Hematologic/Lymphatic Swollen glands?: No Easy bruising?: No  Cardiovascular Leg swelling?: No Chest pain?: No  Respiratory Cough?: No Shortness of breath?: No  Endocrine Excessive thirst?: No  Musculoskeletal Back pain?: No Joint pain?: No  Neurological Headaches?: No Dizziness?: No  Psychologic Depression?: No Anxiety?: No  Physical Exam: BP 137/76 (BP Location: Left Arm, Patient Position: Sitting, Cuff Size: Normal)   Pulse 73   Ht 5\' 7"  (1.702 m)    Wt 193 lb 14.4 oz (88 kg)   BMI 30.37 kg/m   Constitutional: Well nourished. Alert and oriented, No acute distress. HEENT: [place] AT, moist mucus membranes. Trachea midline, no masses. Cardiovascular: No clubbing, cyanosis, or edema. Respiratory: Normal respiratory effort, no increased work of breathing. GI: Abdomen is soft, non tender, non distended, no abdominal masses. Liver and spleen not palpable.  No hernias appreciated.  Stool sample for occult testing is not indicated.   GU: No CVA tenderness.  No bladder fullness or masses.  Patient with uncircumcised phallus.  Foreskin easily retracted  Urethral meatus is patent.  No penile discharge. No penile lesions or rashes. Scrotum without lesions, cysts, rashes and/or edema.  Testicles are located scrotally bilaterally. No masses are appreciated in the testicles. Left and right epididymis are normal. Rectal: Patient with  normal sphincter tone. Anus and perineum without scarring or rashes. No rectal masses are appreciated. Prostate is approximately 50 grams, no nodules are appreciated. Seminal vesicles are normal. Skin: No rashes, bruises or suspicious lesions. Lymph: No cervical or inguinal adenopathy. Neurologic: Grossly intact, no focal deficits, moving all 4 extremities. Psychiatric: Normal mood and affect.  Laboratory Data: Lab Results  Component Value Date   WBC 8.0 03/11/2016   HGB 13.7 03/11/2016   HCT 43.1 03/11/2016   MCV 91.7 03/11/2016   PLT 173 03/11/2016    Lab Results  Component Value Date   CREATININE 1.16 (H) 03/21/2016    Lab Results  Component Value Date   PSA 1.87 05/19/2014    Lab Results  Component Value Date   HGBA1C 5.7 (H) 12/08/2015    Lab Results  Component Value Date   TSH 1.264 12/08/2015       Component Value Date/Time   CHOL 158 06/05/2015 0309   HDL 31 (L) 06/05/2015 0309   CHOLHDL 5.1 06/05/2015 0309   VLDL 47 (H) 06/05/2015 0309   LDLCALC 80 06/05/2015 0309    Lab Results    Component Value Date   AST 30 03/11/2016   Lab Results  Component Value Date   ALT 15 03/11/2016    Urinalysis Patient could not provide specimen at today's visit.  Pertinent Imaging: CLINICAL DATA:  Acute onset of elevated LFTs.  Initial encounter.  EXAM: ABDOMEN ULTRASOUND COMPLETE  COMPARISON:  CTA of the abdomen and pelvis from 08/02/2015  FINDINGS: Gallbladder: Stones and sludge are noted within the gallbladder. Gallbladder wall thickening is noted, measuring up to 5 mm. No ultrasonographic Murphy's sign is elicited. No pericholecystic fluid is seen.  Common bile duct: Diameter: 0.5 cm, within normal limits in caliber.  Liver: No focal lesion identified. Somewhat coarsened hepatic echotexture, raising concern for fatty infiltration. The hepatic contour is slightly nodular, raising question for mild hepatic cirrhosis.  IVC: No abnormality visualized.  Pancreas: Visualized portion unremarkable.  Spleen: Size and appearance within normal limits.  Right Kidney: Length: 11.0 cm. Echogenicity within normal limits. No mass or hydronephrosis visualized. A small amount of fluid is noted between the liver and right kidney.  Left Kidney: Length: 10.7 cm. Echogenicity within normal limits. No mass or hydronephrosis visualized.  Abdominal aorta: No aneurysm visualized. Not well characterized proximally or distally due to overlying structures.  Other findings: None.  IMPRESSION: 1. Stones and sludge within the gallbladder. Gallbladder wall thickening noted. This may reflect chronic inflammation, though would correlate with the patient's symptoms to exclude acute cholecystitis. No evidence of obstruction. 2. Slightly nodular hepatic contour raises question for mild hepatic cirrhosis. Underlying fatty infiltration noted. 3. Trace fluid between the liver and right kidney reflects minimal ascites.   Electronically Signed   By: Garald Balding M.D.    On: 10/18/2015 00:58   Assessment & Plan:    1. Gross hematuria  - Explained to patient the causes of blood in the urine are as follows: stones, BPH, UTI's, damage to the urinary tract and/or cancer.  - I discussed with the patient and his wife the recommended work up for hematuria, CT Urogram and cystoscopy  - He would not like to pursue a CT Urogram at this time as he feels the blood in the urine was caused by injury his penis in his zipper- abdominal ultrasound in 10/2015 noted no abnormalities with his kidneys-advised patient that this is not the appropriate imaging for hematuria and some urological tumors can be missed- he understands this risk  - He has agreed to undergo a cystoscopy for further evaluation  - Urinalysis, Complete  - CULTURE, URINE COMPREHENSIVE  - BUN+Creat  2. ?History of prostate cancer  - if there was prostate cancer, it was most likely a non aggressive prostate cancer  - His PSA was 1.87 in 2015  Return for cystoscopy for hematuria.  These notes generated with voice recognition software. I apologize for typographical errors.  Zara Council, [facility] [address], [place], [place] [address] [phone]

## 2016-06-20 LAB — BUN+CREAT
BUN/Creatinine Ratio: 14 (ref 10–24)
BUN: 19 mg/dL (ref 8–27)
CREATININE: 1.32 mg/dL — AB (ref 0.76–1.27)
GFR calc non Af Amer: 49 mL/min/{1.73_m2} — ABNORMAL LOW (ref >59)
GFR, EST AFRICAN AMERICAN: 56 mL/min/{1.73_m2} — AB (ref >59)

## 2016-07-02 ENCOUNTER — Encounter: Payer: Self-pay | Admitting: Family Medicine

## 2016-07-02 ENCOUNTER — Ambulatory Visit (INDEPENDENT_AMBULATORY_CARE_PROVIDER_SITE_OTHER): Payer: Medicare Other | Admitting: Family Medicine

## 2016-07-02 VITALS — BP 118/72 | HR 78 | Temp 97.8°F | Resp 18 | Ht 67.0 in | Wt 198.0 lb

## 2016-07-02 DIAGNOSIS — R1011 Right upper quadrant pain: Secondary | ICD-10-CM

## 2016-07-02 MED ORDER — PRAVASTATIN SODIUM 40 MG PO TABS: 40.0000 mg | ORAL_TABLET | Freq: Every day | ORAL | 11 refills | Status: DC

## 2016-07-02 NOTE — Progress Notes (Signed)
Subjective:    Patient ID: [name], male    DOB: [birth year], 82 y.o.   MRN: [id number]  HPI  02/29/16 Please see my last office visit.  Patient is here today to recheck the wound on his leg. Since his last office visit he has gained 4 pounds. He now has +2 pitting edema to his mid shin. The very shallow ulcer on his right shin is weeping serous fluid through the Tegaderm. He denies any chest pain or shortness of breath although he does have right basilar crackles. There is no JVD. He is supposed to be on Lasix 20 mg twice daily. Patient is unable to tell me for sure if that's what he is taking. He states he's taking everything on the list but in all honesty he is not sure what medicines are on the list. His wife manages his medications.  At that time, my plan was: Patient seems slightly fluid overloaded on exam today. Increase Lasix to 40 mg twice daily and recheck on Monday. The small shallow ulcer on his right shin was covered with a Band-Aid and the patient was placed back in his compression stockings. Recheck here on Monday to reassess fluid status  03/04/16 Patient has only lost 2 pounds since last week. He still has +2 pitting edema in both legs. He still has faint bibasilar crackles. However after I questioned the patient further, he is only taking 40 mg once a day. I want him to take 40 mg twice a day. Patient became confused and only take it once a day.  At that time, my plan was: Increase Lasix to 40 mg twice daily and recheck in one week  03/11/16 Patient actually came by my office to August 4 and had gained an additional 3 pounds up to 195. He was adamant that he was taking Lasix 40 mg twice a day. Although he never brings his pill bottles and he is never able to take the dose until you tell him what dose she want him on. This raises the concern that his dose could be incorrect at home. However on Friday I instructed him to take 80 mg of Lasix twice a day and then recheck today. He  has lost 4 pounds since his last office visit. His lungs sound clear. He states that he feels better. He still has +1 pitting edema to his knees and +2 bipedal edema in the feet.  At that time, my plan was: I will check a BMP to monitor his potassium and renal function. I would like to continue the higher dose diuretic as long as it is not impacting his renal function or his potassium. Patient will continue 80 mg twice a day until I call him with results of lab work  05/14/16 Patient had done well on 80 mg twice daily until recently when the edema worsened in his right leg. He now has +2 pitting edema in his right leg to his knee even wearing his compression stocking. He has +1 edema in his left leg to his knee. His weight is up significantly from his visit in August. He denies any chest pain or shortness of breath.  At that time, my plan was: Increase Lasix to 120 mg by mouth twice a day and recheck on Friday  Wt Readings from Last 3 Encounters:  07/02/16 198 lb (89.8 kg)  06/19/16 193 lb 14.4 oz (88 kg)  05/28/16 195 lb (88.5 kg)    05/28/16 He is here today  for follow-up now 2 weeks later.  The patient took Lasix 120 mg by mouth twice a day for 3 days. The swelling improved. After that he reduced his Lasix dose to 100 mg (2-1/2 tablets) twice a day. He did this independently. The swelling is only slightly better than it was when I last saw him. He denies any chest pain. He denies any shortness of breath. His lungs are clear to auscultation. He does have pitting edema to his mid shin even wearing compression stockings. However there are no venous stasis ulcers. There is no skin breakdown. The edema is +1. More concerning to me, the patient has had 2 episodes of painless gross hematuria over the last 2 weeks without explanation. The patient states that he was urinating pure blood at one point. He also states that he passed a small clot. He denies any dysuria or fevers or chills. His past medical  history states that he has a history of prostate cancer. He states he was diagnosed with this more than 20 years ago and they treated it with some pills. He does not recall the name of the medication. To clarify he is having gross hematuria not hematospermia.  At that time, my plan was: Patient is not having hematospermia which was the initial message that I received.  Instead he has had 2 episodes of gross hematuria. Otherwise he is doing well. While he continues to have pitting edema in both ankles, he is relatively asymptomatic from this. He denies any chest pain or shortness of breath or dyspnea on exertion. Therefore I do not have a strong indication to increase his Lasix permanently. I will have the patient remain on his current dose of Lasix which will be 80 mg twice daily rather than the 100 mg he has independently decided to take as he seems to not benefit from the slight increase.  I am more concerned by the gross hematuria and therefore I have recommended a urology consultation for cystoscopy due to concern about possible bladder cancer particularly if he is passing clots.  He is on Coumadin for his history of paroxysmal atrial fibrillation. I will also check an INR today to ensure that he is not taking supratherapeutic doses of his Coumadin.  However his most recent INR on October 18 was subtherapeutic at 1.8 and therefore I believe this is unlikely.  07/02/16  Patient had no further episodes of hematuria since his last visit. However suddenly on Saturday, he developed severe sudden right upper quadrant abdominal pain that radiated into the epigastric area. Pain was 8 on a scale of 10. He became nauseated and vomited. Pain subsided spontaneously after 4 hours. He considered going to the emergency room. He was then pain-free for several days. Yesterday he again had sudden onset of right upper quadrant abdominal pain that lasted several hours and was again associated with nausea and vomiting. He  denies any constipation or diarrhea. He denies any hematuria or fever or dysuria. He is having regular bowel movements. Abdominal ultrasound obtained in March during a previous hospitalization sodium numerous gallbladder stones and biliary sludge Past Medical History:  Diagnosis Date  . Chronic diastolic (congestive) heart failure    a. 10/2013 Echo: EF 55-60%, basal-mid anteroseptal and basal-mid inferoseptal HK.  . Chronic fatigue   . Chronic subdural hematoma ([place])    a. 12/2015 Head CT: chronic right holo-hemispheric SDH w/o midline shift.  . Coronary artery disease    a. 11/2014 NSTEMI: DESx 2 placed to LAD and  RCA; b. 08/2015 Cath: LAD patent stent, LCX 56m, RCA patent stent, 34m.  . Diabetes mellitus, type II ([place])    a. HgA1c 6.8 in 03/2015  . ED (erectile dysfunction)   . Gout   . Hemangioma of liver 08/02/2015   12 mm enhancing lesion seen on CT - suspicious for benign hemangioma but not diagnostic - MRI recommended  . HLD (hyperlipidemia)   . Hypertensive heart disease   . Kidney stones   . Lower extremity edema    a. chronic  . OA (osteoarthritis)   . Orthostatic hypotension   . PAF (paroxysmal atrial fibrillation) ([place])    a. 10/2015 s/p DCCV;  b. CHA2DS2VASc = 6--> on coumadin; c. 10/2015 Echo: sev dil LA.  Marland Kitchen Physical deconditioning   . Prostate cancer ([place])   . Pulmonary HTN   . Pulmonary hypertension    a. 10/2015 Echo: PASP 55mmHg.  Marland Kitchen Severe mitral regurgitation    a.  10/2015 s/p minimally invasive MV repair; b. 10/2015 Echo: mod MS, mean grad 32mmHg.  Marland Kitchen Spinal stenosis   . SVT (supraventricular tachycardia) ([facility])    a. recurrent, usually responsive to vagal manuevers  . Tricuspid regurgitation    a. 10/2015 Echo: mild to mod TR.   Past Surgical History:  Procedure Laterality Date  . BACK SURGERY    . Back surgery x 2    . CARDIAC CATHETERIZATION N/A 08/08/2015   Procedure: Right/Left Heart Cath and Coronary Angiography;  Surgeon: Sherren Mocha, MD;  Location: [facility];  Service: Cardiovascular;  Laterality: N/A;  . CARDIAC SURGERY     2 cardiac stents  . CARDIOVERSION N/A 10/20/2015   Procedure: CARDIOVERSION;  Surgeon: Pixie Casino, MD;  Location: [facility];  Service: Cardiovascular;  Laterality: N/A;  . CERVICAL SPINE SURGERY    . EYE SURGERY    . INTRAOPERATIVE TRANSESOPHAGEAL ECHOCARDIOGRAM  10/05/2015   Procedure: INTRAOPERATIVE TRANSESOPHAGEAL ECHOCARDIOGRAM;  Surgeon: Rexene Alberts, MD;  [facility];  Service: Open Heart Surgery;;  . JOINT REPLACEMENT     shoulder  . LEFT HEART CATHETERIZATION WITH CORONARY ANGIOGRAM N/A 11/16/2014   Procedure: LEFT HEART CATHETERIZATION WITH CORONARY ANGIOGRAM;  Surgeon: Troy Sine, MD;  Location: [facility] CATH LAB;  Service: Cardiovascular;  Laterality: N/A;  . MITRAL VALVE REPAIR Right 10/04/2015   Procedure: MINIMALLY INVASIVE MITRAL VALVE REPAIR (MVR);  Surgeon: Rexene Alberts, MD;  Location: [facility];  Service: Open Heart Surgery;  Laterality: Right;  . Rt rotator cuff repair    . TEE WITHOUT CARDIOVERSION N/A 06/22/2015   Procedure: TRANSESOPHAGEAL ECHOCARDIOGRAM (TEE);  Surgeon: Skeet Latch, MD;  Location: [facility];  Service: Cardiovascular;  Laterality: N/A;  . TEE WITHOUT CARDIOVERSION N/A 10/04/2015   Procedure: TRANSESOPHAGEAL ECHOCARDIOGRAM (TEE);  Surgeon: Rexene Alberts, MD;  Location: [place];  Service: Open Heart Surgery;  Laterality: N/A;  . WOUND EXPLORATION N/A 10/05/2015   Procedure: Sternal Incision;  Surgeon: Rexene Alberts, MD;  Location: [facility];  Service: Open Heart Surgery;  Laterality: N/A;   Current Outpatient Prescriptions on File Prior to Visit  Medication Sig Dispense Refill  . aspirin EC 81 MG tablet Take 81 mg by mouth every other day. Reported on 12/29/2015    . finasteride (PROSCAR) 5 MG tablet TAKE ONE (1) TABLET EACH DAY 30 tablet 11  . fluticasone (FLONASE) 50 MCG/ACT nasal spray Place 1 spray into both nostrils daily as needed for allergies or rhinitis.     . furosemide (  LASIX) 40 MG tablet Take 2 tablets (80 mg total) by mouth 2 (two) times daily. 60 tablet 3  . metoprolol tartrate (LOPRESSOR) 25 MG tablet Take 0.5 tablets (12.5 mg total) by mouth 2 (two) times daily. 60 tablet 3  . ondansetron (ZOFRAN) 4 MG tablet Take 1 tablet (4 mg total) by mouth every 8 (eight) hours as needed for nausea or vomiting. 20 tablet 0  . potassium chloride SA (K-DUR,KLOR-CON) 20 MEQ tablet Take 1 tablet (20 mEq total) by mouth daily. 30 tablet 1  . traMADol (ULTRAM) 50 MG tablet Take 1 tablet (50 mg total) by mouth every 6 (six) hours as needed for moderate pain. 30 tablet 0  . warfarin (COUMADIN) 2 MG tablet Take 1-2 tablets daily or as directed by coumadin clinic 60 tablet 1   No current facility-administered medications on file prior to visit.    No Known Allergies Social History   Social History  . Marital status: Married    Spouse name: N/A  . Number of children: 4  . Years of education: N/A   Occupational History  .  Retired   Social History Main Topics  . Smoking status: Former Research scientist (life sciences)  . Smokeless tobacco: Never Used     Comment: QUIT SMOKING  MANY YEARS AGO"  . Alcohol use No  . Drug use: No  . Sexual activity: Not on file   Other Topics Concern  . Not on file   Social History Narrative   ** Merged History Encounter **       Four adopted children.  Lives alone.      Review of Systems  All other systems reviewed and are negative.      Objective:   Physical Exam  Neck: Neck supple. No JVD present.  Cardiovascular: Normal rate and regular rhythm.   Murmur heard. Pulmonary/Chest: Effort normal. He has no wheezes. He has no rales.  Abdominal: Soft. Bowel sounds are normal.  Musculoskeletal: He exhibits edema.  Lymphadenopathy:    He has no cervical adenopathy.  Neurological: He has normal reflexes. No cranial nerve deficit. He exhibits normal muscle tone.  Vitals reviewed.    Wt Readings from Last 3 Encounters:  07/02/16  198 lb (89.8 kg)  06/19/16 193 lb 14.4 oz (88 kg)  05/28/16 195 lb (88.5 kg)        Assessment & Plan:  Colicky RUQ abdominal pain - Plan: CBC with Differential/Platelet, COMPLETE METABOLIC PANEL WITH GFR, Lipase, Ambulatory referral to General Surgery  I believe the patient is having biliary colic. Given his age and multiple medical comorbidities he is a very high-risk surgical candidate. However the pain continues to happen over the last week. Therefore I'll obtain a CBC as well as a CMP to evaluate further. Consult general surgery. If the patient continues to have biliary colic, he may not have any other choice but to have his gallbladder removed.

## 2016-07-03 ENCOUNTER — Telehealth: Payer: Self-pay

## 2016-07-03 ENCOUNTER — Emergency Department (HOSPITAL_COMMUNITY): Payer: Medicare Other

## 2016-07-03 ENCOUNTER — Encounter (HOSPITAL_COMMUNITY): Payer: Self-pay | Admitting: *Deleted

## 2016-07-03 ENCOUNTER — Inpatient Hospital Stay (HOSPITAL_COMMUNITY)
Admission: EM | Admit: 2016-07-03 | Discharge: 2016-07-09 | DRG: 417 | Disposition: A | Discharge status: Home Health | Payer: Medicare Other | Attending: Internal Medicine | Admitting: Internal Medicine

## 2016-07-03 DIAGNOSIS — R627 Adult failure to thrive: Secondary | ICD-10-CM | POA: Diagnosis present

## 2016-07-03 DIAGNOSIS — I480 Paroxysmal atrial fibrillation: Secondary | ICD-10-CM

## 2016-07-03 DIAGNOSIS — Z7982 Long term (current) use of aspirin: Secondary | ICD-10-CM

## 2016-07-03 DIAGNOSIS — K740 Hepatic fibrosis: Secondary | ICD-10-CM | POA: Diagnosis not present

## 2016-07-03 DIAGNOSIS — E876 Hypokalemia: Secondary | ICD-10-CM | POA: Diagnosis present

## 2016-07-03 DIAGNOSIS — Z8546 Personal history of malignant neoplasm of prostate: Secondary | ICD-10-CM

## 2016-07-03 DIAGNOSIS — K8010 Calculus of gallbladder with chronic cholecystitis without obstruction: Secondary | ICD-10-CM | POA: Diagnosis not present

## 2016-07-03 DIAGNOSIS — I5032 Chronic diastolic (congestive) heart failure: Secondary | ICD-10-CM | POA: Diagnosis not present

## 2016-07-03 DIAGNOSIS — I110 Hypertensive heart disease with heart failure: Secondary | ICD-10-CM | POA: Diagnosis present

## 2016-07-03 DIAGNOSIS — Z79899 Other long term (current) drug therapy: Secondary | ICD-10-CM | POA: Diagnosis not present

## 2016-07-03 DIAGNOSIS — K8020 Calculus of gallbladder without cholecystitis without obstruction: Secondary | ICD-10-CM | POA: Diagnosis not present

## 2016-07-03 DIAGNOSIS — E785 Hyperlipidemia, unspecified: Secondary | ICD-10-CM | POA: Diagnosis not present

## 2016-07-03 DIAGNOSIS — M1A9XX1 Chronic gout, unspecified, with tophus (tophi): Secondary | ICD-10-CM | POA: Diagnosis not present

## 2016-07-03 DIAGNOSIS — K551 Chronic vascular disorders of intestine: Secondary | ICD-10-CM | POA: Diagnosis not present

## 2016-07-03 DIAGNOSIS — Z23 Encounter for immunization: Secondary | ICD-10-CM | POA: Diagnosis not present

## 2016-07-03 DIAGNOSIS — I6203 Nontraumatic chronic subdural hemorrhage: Secondary | ICD-10-CM | POA: Diagnosis not present

## 2016-07-03 DIAGNOSIS — Z01810 Encounter for preprocedural cardiovascular examination: Secondary | ICD-10-CM | POA: Diagnosis not present

## 2016-07-03 DIAGNOSIS — Z87891 Personal history of nicotine dependence: Secondary | ICD-10-CM

## 2016-07-03 DIAGNOSIS — E119 Type 2 diabetes mellitus without complications: Secondary | ICD-10-CM | POA: Diagnosis not present

## 2016-07-03 DIAGNOSIS — Q444 Choledochal cyst: Secondary | ICD-10-CM | POA: Diagnosis not present

## 2016-07-03 DIAGNOSIS — I2510 Atherosclerotic heart disease of native coronary artery without angina pectoris: Secondary | ICD-10-CM | POA: Diagnosis not present

## 2016-07-03 DIAGNOSIS — K7460 Unspecified cirrhosis of liver: Secondary | ICD-10-CM | POA: Diagnosis not present

## 2016-07-03 DIAGNOSIS — I071 Rheumatic tricuspid insufficiency: Secondary | ICD-10-CM | POA: Diagnosis not present

## 2016-07-03 DIAGNOSIS — Z7902 Long term (current) use of antithrombotics/antiplatelets: Secondary | ICD-10-CM

## 2016-07-03 DIAGNOSIS — K851 Biliary acute pancreatitis without necrosis or infection: Secondary | ICD-10-CM | POA: Diagnosis not present

## 2016-07-03 DIAGNOSIS — I272 Pulmonary hypertension, unspecified: Secondary | ICD-10-CM | POA: Diagnosis not present

## 2016-07-03 DIAGNOSIS — M79671 Pain in right foot: Secondary | ICD-10-CM | POA: Diagnosis not present

## 2016-07-03 DIAGNOSIS — Z7901 Long term (current) use of anticoagulants: Secondary | ICD-10-CM | POA: Diagnosis not present

## 2016-07-03 DIAGNOSIS — R1310 Dysphagia, unspecified: Secondary | ICD-10-CM | POA: Diagnosis not present

## 2016-07-03 DIAGNOSIS — R5381 Other malaise: Secondary | ICD-10-CM

## 2016-07-03 DIAGNOSIS — Z9889 Other specified postprocedural states: Secondary | ICD-10-CM

## 2016-07-03 DIAGNOSIS — M1990 Unspecified osteoarthritis, unspecified site: Secondary | ICD-10-CM | POA: Diagnosis not present

## 2016-07-03 DIAGNOSIS — R011 Cardiac murmur, unspecified: Secondary | ICD-10-CM | POA: Diagnosis present

## 2016-07-03 DIAGNOSIS — R935 Abnormal findings on diagnostic imaging of other abdominal regions, including retroperitoneum: Secondary | ICD-10-CM | POA: Diagnosis not present

## 2016-07-03 DIAGNOSIS — I252 Old myocardial infarction: Secondary | ICD-10-CM

## 2016-07-03 DIAGNOSIS — Z955 Presence of coronary angioplasty implant and graft: Secondary | ICD-10-CM

## 2016-07-03 DIAGNOSIS — M7989 Other specified soft tissue disorders: Secondary | ICD-10-CM | POA: Diagnosis not present

## 2016-07-03 LAB — BRAIN NATRIURETIC PEPTIDE: B NATRIURETIC PEPTIDE 5: 617.4 pg/mL — AB (ref 0.0–100.0)

## 2016-07-03 LAB — CBC WITH DIFFERENTIAL/PLATELET
BASOS PCT: 0 %
Basophils Absolute: 0 cells/uL (ref 0–200)
EOS ABS: 138 {cells}/uL (ref 15–500)
EOS PCT: 2 %
HCT: 39.3 % (ref 38.5–50.0)
Hemoglobin: 12.8 g/dL — ABNORMAL LOW (ref 13.0–17.0)
LYMPHS PCT: 33 %
Lymphs Abs: 2277 cells/uL (ref 850–3900)
MCH: 30.1 pg (ref 27.0–33.0)
MCHC: 32.6 g/dL (ref 32.0–36.0)
MCV: 92.5 fL (ref 80.0–100.0)
MONOS PCT: 7 %
MPV: 10.7 fL (ref 7.5–12.5)
Monocytes Absolute: 483 cells/uL (ref 200–950)
NEUTROS ABS: 4002 {cells}/uL (ref 1500–7800)
Neutrophils Relative %: 58 %
PLATELETS: 175 10*3/uL (ref 140–400)
RBC: 4.25 MIL/uL (ref 4.20–5.80)
RDW: 14.2 % (ref 11.0–15.0)
WBC: 6.9 10*3/uL (ref 3.8–10.8)

## 2016-07-03 LAB — CBC
HEMATOCRIT: 36.9 % — AB (ref 39.0–52.0)
HEMATOCRIT: 38.2 % — AB (ref 39.0–52.0)
Hemoglobin: 12.8 g/dL — ABNORMAL LOW (ref 13.0–17.0)
Hemoglobin: 12.2 g/dL — ABNORMAL LOW (ref 13.0–17.0)
MCH: 30.3 pg (ref 26.0–34.0)
MCH: 30.8 pg (ref 26.0–34.0)
MCHC: 33.5 g/dL (ref 30.0–36.0)
MCHC: 33.1 g/dL (ref 30.0–36.0)
MCV: 91.8 fL (ref 78.0–100.0)
MCV: 91.8 fL (ref 78.0–100.0)
PLATELETS: 158 10*3/uL (ref 150–400)
Platelets: 149 10*3/uL — ABNORMAL LOW (ref 150–400)
RBC: 4.02 MIL/uL — AB (ref 4.22–5.81)
RBC: 4.16 MIL/uL — AB (ref 4.22–5.81)
RDW: 15.1 % (ref 11.5–15.5)
RDW: 15 % (ref 11.5–15.5)
WBC: 7.2 10*3/uL (ref 4.0–10.5)
WBC: 7.8 10*3/uL (ref 4.0–10.5)

## 2016-07-03 LAB — COMPREHENSIVE METABOLIC PANEL
ALBUMIN: 2.9 g/dL — AB (ref 3.5–5.0)
ALT: 103 U/L — ABNORMAL HIGH (ref 17–63)
ALT: 104 U/L — AB (ref 17–63)
AST: 134 U/L — AB (ref 15–41)
AST: 152 U/L — ABNORMAL HIGH (ref 15–41)
Albumin: 3.1 g/dL — ABNORMAL LOW (ref 3.5–5.0)
Alkaline Phosphatase: 299 U/L — ABNORMAL HIGH (ref 38–126)
Alkaline Phosphatase: 310 U/L — ABNORMAL HIGH (ref 38–126)
Anion gap: 10 (ref 5–15)
Anion gap: 12 (ref 5–15)
BUN: 17 mg/dL (ref 6–20)
BUN: 18 mg/dL (ref 6–20)
CHLORIDE: 101 mmol/L (ref 101–111)
CHLORIDE: 102 mmol/L (ref 101–111)
CO2: 24 mmol/L (ref 22–32)
CO2: 28 mmol/L (ref 22–32)
CREATININE: 1.2 mg/dL (ref 0.61–1.24)
Calcium: 9 mg/dL (ref 8.9–10.3)
Calcium: 8.9 mg/dL (ref 8.9–10.3)
Creatinine, Ser: 1.11 mg/dL (ref 0.61–1.24)
GFR calc Af Amer: >60 mL/min (ref >60)
GFR calc Af Amer: >60 mL/min (ref >60)
GFR, EST NON AFRICAN AMERICAN: 53 mL/min — AB (ref >60)
GFR, EST NON AFRICAN AMERICAN: 59 mL/min — AB (ref >60)
Glucose, Bld: 131 mg/dL — ABNORMAL HIGH (ref 65–99)
Glucose, Bld: 129 mg/dL — ABNORMAL HIGH (ref 65–99)
POTASSIUM: 3.3 mmol/L — AB (ref 3.5–5.1)
POTASSIUM: 3.6 mmol/L (ref 3.5–5.1)
SODIUM: 138 mmol/L (ref 135–145)
SODIUM: 139 mmol/L (ref 135–145)
Total Bilirubin: 2.9 mg/dL — ABNORMAL HIGH (ref 0.3–1.2)
Total Bilirubin: 3.2 mg/dL — ABNORMAL HIGH (ref 0.3–1.2)
Total Protein: 7.1 g/dL (ref 6.5–8.1)
Total Protein: 6.8 g/dL (ref 6.5–8.1)

## 2016-07-03 LAB — APTT: APTT: 36 s (ref 24–36)

## 2016-07-03 LAB — COMPLETE METABOLIC PANEL WITH GFR
ALT: 93 U/L — AB (ref 9–46)
AST: 97 U/L — AB (ref 10–35)
Albumin: 3.4 g/dL — ABNORMAL LOW (ref 3.6–5.1)
Alkaline Phosphatase: 283 U/L — ABNORMAL HIGH (ref 40–115)
BILIRUBIN TOTAL: 1.3 mg/dL — AB (ref 0.2–1.2)
BUN: 18 mg/dL (ref 7–25)
CHLORIDE: 102 mmol/L (ref 98–110)
CO2: 26 mmol/L (ref 20–31)
CREATININE: 1.01 mg/dL (ref 0.70–1.11)
Calcium: 8.5 mg/dL — ABNORMAL LOW (ref 8.6–10.3)
GFR, Est African American: 78 mL/min (ref >=60)
GFR, Est Non African American: 68 mL/min (ref >=60)
GLUCOSE: 116 mg/dL — AB (ref 70–99)
Potassium: 3.5 mmol/L (ref 3.5–5.3)
SODIUM: 138 mmol/L (ref 135–146)
TOTAL PROTEIN: 6.5 g/dL (ref 6.1–8.1)

## 2016-07-03 LAB — CBG MONITORING, ED
GLUCOSE-CAPILLARY: 100 mg/dL — AB (ref 65–99)
Glucose-Capillary: 101 mg/dL — ABNORMAL HIGH (ref 65–99)

## 2016-07-03 LAB — PROTIME-INR
INR: 2.59
Prothrombin Time: 28.3 seconds — ABNORMAL HIGH (ref 11.4–15.2)

## 2016-07-03 LAB — GLUCOSE, CAPILLARY
Glucose-Capillary: 90 mg/dL (ref 65–99)
Glucose-Capillary: 108 mg/dL — ABNORMAL HIGH (ref 65–99)

## 2016-07-03 LAB — LIPASE, BLOOD: LIPASE: 1675 U/L — AB (ref 11–51)

## 2016-07-03 LAB — TYPE AND SCREEN
ABO/RH(D): A POS
ANTIBODY SCREEN: NEGATIVE

## 2016-07-03 LAB — LIPASE: Lipase: 311 U/L — ABNORMAL HIGH (ref 7–60)

## 2016-07-03 MED ORDER — ZOLPIDEM TARTRATE 5 MG PO TABS: 5.0000 mg | ORAL_TABLET | Freq: Every evening | ORAL | Status: DC | PRN

## 2016-07-03 MED ORDER — INSULIN ASPART 100 UNIT/ML ~~LOC~~ SOLN: 0.0000 [IU] | Freq: Every day | SUBCUTANEOUS | Status: DC

## 2016-07-03 MED ORDER — FLUTICASONE PROPIONATE 50 MCG/ACT NA SUSP: 1.0000 | Freq: Every day | NASAL | Status: DC | PRN

## 2016-07-03 MED ORDER — PRAVASTATIN SODIUM 40 MG PO TABS
40.0000 mg | ORAL_TABLET | Freq: Every day | ORAL | Status: DC
  Administered 2016-07-05 – 2016-07-08 (×4): 40 mg via ORAL
  Filled 2016-07-03 (×4): qty 1

## 2016-07-03 MED ORDER — SODIUM CHLORIDE 0.9 % IV SOLN
INTRAVENOUS | Status: DC
  Administered 2016-07-03: 05:00:00 via INTRAVENOUS

## 2016-07-03 MED ORDER — SODIUM CHLORIDE 0.9 % IV SOLN
INTRAVENOUS | Status: DC
  Administered 2016-07-03: 03:00:00 via INTRAVENOUS

## 2016-07-03 MED ORDER — SODIUM CHLORIDE 0.9% FLUSH
3.0000 mL | Freq: Two times a day (BID) | INTRAVENOUS | Status: DC
  Administered 2016-07-04 – 2016-07-09 (×9): 3 mL via INTRAVENOUS

## 2016-07-03 MED ORDER — ONDANSETRON HCL 4 MG/2ML IJ SOLN: 4.0000 mg | Freq: Three times a day (TID) | INTRAMUSCULAR | Status: DC | PRN

## 2016-07-03 MED ORDER — INSULIN ASPART 100 UNIT/ML ~~LOC~~ SOLN
0.0000 [IU] | Freq: Three times a day (TID) | SUBCUTANEOUS | Status: DC
  Administered 2016-07-05: 1 [IU] via SUBCUTANEOUS
  Administered 2016-07-05: 2 [IU] via SUBCUTANEOUS
  Administered 2016-07-06: 1 [IU] via SUBCUTANEOUS
  Administered 2016-07-06 – 2016-07-07 (×2): 2 [IU] via SUBCUTANEOUS
  Administered 2016-07-07: 1 [IU] via SUBCUTANEOUS
  Administered 2016-07-08 – 2016-07-09 (×2): 2 [IU] via SUBCUTANEOUS
  Administered 2016-07-09: 1 [IU] via SUBCUTANEOUS

## 2016-07-03 MED ORDER — MORPHINE SULFATE (PF) 4 MG/ML IV SOLN
2.0000 mg | INTRAVENOUS | Status: DC | PRN
Start: 2016-07-03 — End: 2016-07-09

## 2016-07-03 MED ORDER — FINASTERIDE 5 MG PO TABS
5.0000 mg | ORAL_TABLET | Freq: Every day | ORAL | Status: DC
  Administered 2016-07-04 – 2016-07-09 (×6): 5 mg via ORAL
  Filled 2016-07-03 (×7): qty 1

## 2016-07-03 MED ORDER — KCL IN DEXTROSE-NACL 20-5-0.9 MEQ/L-%-% IV SOLN
INTRAVENOUS | Status: DC
  Administered 2016-07-03 – 2016-07-05 (×3): via INTRAVENOUS
  Filled 2016-07-03 (×5): qty 1000

## 2016-07-03 MED ORDER — INFLUENZA VAC SPLIT QUAD 0.5 ML IM SUSY
0.5000 mL | PREFILLED_SYRINGE | INTRAMUSCULAR | Status: AC
  Administered 2016-07-06: 0.5 mL via INTRAMUSCULAR
  Filled 2016-07-03: qty 0.5

## 2016-07-03 MED ORDER — METOPROLOL TARTRATE 12.5 MG HALF TABLET
12.5000 mg | ORAL_TABLET | Freq: Two times a day (BID) | ORAL | Status: DC
  Administered 2016-07-03 – 2016-07-09 (×12): 12.5 mg via ORAL
  Filled 2016-07-03 (×12): qty 1

## 2016-07-03 MED ORDER — ASPIRIN EC 81 MG PO TBEC
81.0000 mg | DELAYED_RELEASE_TABLET | ORAL | Status: DC
  Administered 2016-07-04 – 2016-07-06 (×2): 81 mg via ORAL
  Filled 2016-07-03 (×4): qty 1

## 2016-07-03 NOTE — ED Notes (Signed)
GI consult  into see pt

## 2016-07-03 NOTE — ED Notes (Signed)
Wife in room, pt awaiting ready room upstairs

## 2016-07-03 NOTE — ED Notes (Signed)
Patient CBG was 100.

## 2016-07-03 NOTE — Telephone Encounter (Signed)
Pt admitted

## 2016-07-03 NOTE — Telephone Encounter (Signed)
[name] from [facility] called to report Critical lab alert Lipase [phone] Pls advise

## 2016-07-03 NOTE — ED Notes (Signed)
Patient CBG was 101.

## 2016-07-03 NOTE — Progress Notes (Signed)
Patient seen and examined, vital stable, , he report right upper quadrant pain has much improved, no n/v. On exam: mild right upper qqudran tenderness, no guarding, no rebound, he does has some bilateral lower extremity pitting edema report this is better than yesterday.   He is currently npo, lasix held, change ivf to d5/ns with k at 50cc/hr,  H/o paf, coumadin held, monitor inr  [facility] consulted ( patient 's gi is dr Laural Golden per epic record, patient not able to provide detailed history, family not able to reach by phone, I have disucssed with [facility] pa sarrah who states patient is unassigned, [facility] should be consulted)

## 2016-07-03 NOTE — ED Notes (Signed)
Patient is NPO at this time, No diet ordered for Lunch.

## 2016-07-03 NOTE — ED Notes (Signed)
Pt transported to US

## 2016-07-03 NOTE — ED Notes (Signed)
[name] (wife) [phone]

## 2016-07-03 NOTE — ED Triage Notes (Signed)
Pt c/o R sided lower abdominal pain, one episode on Saturday then on Thursday. Pt reports hx of gallstones. Pain associated with nausea

## 2016-07-03 NOTE — ED Provider Notes (Signed)
By signing my name below, I, [name], attest that this documentation has been prepared under the direction and in the presence of [name], DO  Electronically Signed: [name], ED Scribe. 07/03/16. 2:41 AM.  TIME SEEN: 2:14 AM  CHIEF COMPLAINT: Abdominal Pain   HPI:   [name] is a 82 y.o. male, with a hx of gallstones, a fib on Coumadin, HTN, HLD, DM, CAD, diastolic heart failure and CAD on Plavix, who presents to the Emergency Department complaining of sudden onset, intermittent episodes of RUQ abdominal pain x 6 days. He notes associated nausea. Pt denies current pain or nausea. He also denies fevers, vomiting, diarrhea, chest pain, SOB, hx of abdominal surgeries and any other associated symptoms or modifying factors at this time. Pt visited his PCP, Dr. [name], yesterday and it was thought his pain was secondary to biliary colic. Has a previous known history of gallstones and biliary sludge.    ROS: See HPI Constitutional: no fever; + chills Eyes: no drainage  ENT: no runny nose   Cardiovascular:  no chest pain  Resp: no SOB  GI: no vomiting, positive abdominal pain and nausea.  GU: no dysuria Integumentary: no rash  Allergy: no hives  Musculoskeletal: no leg swelling  Neurological: no slurred speech ROS otherwise negative  PAST MEDICAL HISTORY/PAST SURGICAL HISTORY:  Past Medical History:  Diagnosis Date  . Chronic diastolic (congestive) heart failure    a. 10/2013 Echo: EF 55-60%, basal-mid anteroseptal and basal-mid inferoseptal HK.  . Chronic fatigue   . Chronic subdural hematoma ([place])    a. 12/2015 Head CT: chronic right holo-hemispheric SDH w/o midline shift.  . Coronary artery disease    a. 11/2014 NSTEMI: DESx 2 placed to LAD and RCA; b. 08/2015 Cath: LAD patent stent, LCX 52m, RCA patent stent, 20m.  . Diabetes mellitus, type II ([place])    a. HgA1c 6.8 in 03/2015  . ED (erectile dysfunction)   . Gout   . Hemangioma of liver 08/02/2015   12  mm enhancing lesion seen on CT - suspicious for benign hemangioma but not diagnostic - MRI recommended  . HLD (hyperlipidemia)   . Hypertensive heart disease   . Kidney stones   . Lower extremity edema    a. chronic  . OA (osteoarthritis)   . Orthostatic hypotension   . PAF (paroxysmal atrial fibrillation) ([place])    a. 10/2015 s/p DCCV;  b. CHA2DS2VASc = 6--> on coumadin; c. 10/2015 Echo: sev dil LA.  Marland Kitchen Physical deconditioning   . Prostate cancer ([place])   . Pulmonary HTN   . Pulmonary hypertension    a. 10/2015 Echo: PASP 34mmHg.  Marland Kitchen Severe mitral regurgitation    a.  10/2015 s/p minimally invasive MV repair; b. 10/2015 Echo: mod MS, mean grad 75mmHg.  Marland Kitchen Spinal stenosis   . SVT (supraventricular tachycardia) ([facility])    a. recurrent, usually responsive to vagal manuevers  . Tricuspid regurgitation    a. 10/2015 Echo: mild to mod TR.    MEDICATIONS:  Prior to Admission medications   Medication Sig Start Date End Date Taking? Authorizing Provider  aspirin EC 81 MG tablet Take 81 mg by mouth every other day. Reported on 12/29/2015    Historical Provider, MD  finasteride (PROSCAR) 5 MG tablet TAKE ONE (1) TABLET EACH DAY 04/10/16   [name], MD  fluticasone [facility]) 50 MCG/ACT nasal spray Place 1 spray into both nostrils daily as needed for allergies or rhinitis.  Historical Provider, MD  furosemide (LASIX) 40 MG tablet Take 2 tablets (80 mg total) by mouth 2 (two) times daily. 04/22/16   [name], MD  metoprolol tartrate (LOPRESSOR) 25 MG tablet Take 0.5 tablets (12.5 mg total) by mouth 2 (two) times daily. 01/29/16   [name], MD  ondansetron (ZOFRAN) 4 MG tablet Take 1 tablet (4 mg total) by mouth every 8 (eight) hours as needed for nausea or vomiting. 12/21/15   [name], MD  potassium chloride SA (K-DUR,KLOR-CON) 20 MEQ tablet Take 1 tablet (20 mEq total) by mouth daily. 10/31/15   [name], PA  pravastatin (PRAVACHOL) 40 MG tablet Take 1 tablet (40 mg  total) by mouth daily at 6 PM. 07/02/16   [name], MD  traMADol (ULTRAM) 50 MG tablet Take 1 tablet (50 mg total) by mouth every 6 (six) hours as needed for moderate pain. 02/29/16   [name], MD  warfarin (COUMADIN) 2 MG tablet Take 1-2 tablets daily or as directed by coumadin clinic 04/12/16   [name], MD    ALLERGIES:  No Known Allergies  SOCIAL HISTORY:  Social History  Substance Use Topics  . Smoking status: Former Research scientist (life sciences)  . Smokeless tobacco: Never Used     Comment: QUIT SMOKING  MANY YEARS AGO"  . Alcohol use No    FAMILY HISTORY: Family History  Problem Relation Age of Onset  . Heart failure Mother 73  . Heart attack Mother   . Heart attack Brother     EXAM: BP 103/85 (BP Location: Right Arm)   Pulse 62   Temp 97.5 F (36.4 C) (Oral)   Resp 20   SpO2 96%  CONSTITUTIONAL: Alert and oriented and responds appropriately to questions. Well-appearing; well-nourished. Elderly  HEAD: Normocephalic EYES: Conjunctivae clear, PERRL, EOMI ENT: normal nose; no rhinorrhea; moist mucous membranes NECK: Supple, no meningismus, no nuchal rigidity, no LAD  CARD: RRR; S1 and S2 appreciated; no murmurs, no clicks, no rubs, no gallops RESP: Normal chest excursion without splinting or tachypnea; breath sounds clear and equal bilaterally; no wheezes, no rhonchi, no rales, no hypoxia or respiratory distress, speaking full sentences ABD/GI: Normal bowel sounds; non-distended; soft, non-tender, no rebound, no guarding, no peritoneal signs, no hepatosplenomegaly BACK:  The back appears normal and is non-tender to palpation, there is no CVA tenderness EXT: Normal ROM in all joints; non-tender to palpation; no edema; normal capillary refill; no cyanosis, no calf tenderness or swelling    SKIN: Slightly jaundice appearing; warm; no rash.   NEURO: Moves all extremities equally, sensation to light touch intact diffusely, cranial nerves II through XII intact, normal  speech PSYCH: The patient's mood and manner are appropriate. Grooming and personal hygiene are appropriate.  MEDICAL DECISION MAKING: Patient here with intermittent right upper quadrant abdominal pain, epigastric pain, nausea. Labs show elevation of his liver function tests, lipase. Concern for gallstone pancreatitis. No leukocytosis or fever. Will obtain a right upper quadrant ultrasound to evaluate his gallbladder and ducts. No fever, hypotension. Doubt cholangitis. We'll continue to hydrate patient gently. He does have a history of diastolic heart failure. Anticipate admission. He is NPO.  ED PROGRESS: 3:50 AM  Pt's ultrasound shows cholelithiasis and mild gallbladder wall thickening raising question for chronic inflammation but no sign of obstruction or acute cholecystitis. No choledocholithiasis. Doubt cholangitis based on his exam. He is not septic. I do not feel he needs antibiotics. He is receiving gentle IV hydration. We will keep  him NPO.  I feel gastric neurology can be consulted non-emergently in the morning.  Discussed patient's case with hospitalist, Dr. Blaine Hamper.  Recommend admission to telemetry, inpatient bed.  I will place holding orders per their request. Patient and family (if present) updated with plan. Care transferred to hospitalist service.  I reviewed all nursing notes, vitals, pertinent old records, EKGs, labs, imaging (as available).     I personally performed the services described in this documentation, which was scribed in my presence. The recorded information has been reviewed and is accurate.     [place], DO 07/03/16 [phone]

## 2016-07-03 NOTE — H&P (Addendum)
History and Physical    [name] [id number] DOB: 1929-03-31 DOA: 07/03/2016  Referring MD/NP/PA:   PCP: [name], MD   Patient coming from:  The patient is coming from home.  At baseline, pt is independent for most of ADL.   Chief Complaint: Abdominal pain  HPI: [name] is a 82 y.o. male with medical history significant of gallstone, diet-controlled diabetes, hyperlipidemia, prostate cancer, PAF on Coumadin, CAD, dCHF, tricuspid valve regurgitation, atrial valve regurgitation (s/p of repair), pulmonary hypertension, gout, who presents with abdominal pain.  Patient states that he started having abdominal pain since Thanksgiving day. It is located in the right upper quadrant, constant, 6 out of 10 in severity, sharp, nonradiating. It is associated with nausea, but not vomiting or diarrhea. Patient does not have fever or chills. Patient does not have chest pain, shortness of breath, cough, symptoms of UTI or unilateral weakness.  ED Course: pt was found to have lipase is 1675, INR 2.59, WBC 7.8, creatinine 1.01, temperature normal, no tachycardia, oxygen saturation 96% on room air, pending urinalysis. Abdominal ultrasound showed cholelithiasis without signs of cholecystitis. No dilation of common bile duct. Pt is admitted to telemetry bed as inpatient.  Review of Systems:   General: no fevers, chills, no changes in body weight, has poor appetite, has fatigue HEENT: no blurry vision, hearing changes or sore throat Respiratory: no dyspnea, coughing, wheezing CV: no chest pain, no palpitations GI: has nausea and abdominal pain. No vomiting, diarrhea, constipation GU: no dysuria, burning on urination, increased urinary frequency, hematuria  Ext: mild leg edema Neuro: no unilateral weakness, numbness, or tingling, no vision change or hearing loss Skin: no rash, no skin tear. MSK: No muscle spasm, no deformity, no limitation of range of movement in spin Heme: No  easy bruising.  Travel history: No recent long distant travel.  Allergy: No Known Allergies  Past Medical History:  Diagnosis Date  . Chronic diastolic (congestive) heart failure    a. 10/2013 Echo: EF 55-60%, basal-mid anteroseptal and basal-mid inferoseptal HK.  . Chronic fatigue   . Chronic subdural hematoma ([place])    a. 12/2015 Head CT: chronic right holo-hemispheric SDH w/o midline shift.  . Coronary artery disease    a. 11/2014 NSTEMI: DESx 2 placed to LAD and RCA; b. 08/2015 Cath: LAD patent stent, LCX 62m, RCA patent stent, 56m.  . Diabetes mellitus, type II ([place])    a. HgA1c 6.8 in 03/2015  . ED (erectile dysfunction)   . Gout   . Hemangioma of liver 08/02/2015   12 mm enhancing lesion seen on CT - suspicious for benign hemangioma but not diagnostic - MRI recommended  . HLD (hyperlipidemia)   . Hypertensive heart disease   . Kidney stones   . Lower extremity edema    a. chronic  . OA (osteoarthritis)   . Orthostatic hypotension   . PAF (paroxysmal atrial fibrillation) ([place])    a. 10/2015 s/p DCCV;  b. CHA2DS2VASc = 6--> on coumadin; c. 10/2015 Echo: sev dil LA.  Marland Kitchen Physical deconditioning   . Prostate cancer ([place])   . Pulmonary HTN   . Pulmonary hypertension    a. 10/2015 Echo: PASP 19mmHg.  Marland Kitchen Severe mitral regurgitation    a.  10/2015 s/p minimally invasive MV repair; b. 10/2015 Echo: mod MS, mean grad 7mmHg.  Marland Kitchen Spinal stenosis   . SVT (supraventricular tachycardia) (HCC)    a. recurrent, usually responsive to vagal manuevers  . Tricuspid regurgitation  a. 10/2015 Echo: mild to mod TR.    Past Surgical History:  Procedure Laterality Date  . BACK SURGERY    . Back surgery x 2    . CARDIAC CATHETERIZATION N/A 08/08/2015   Procedure: Right/Left Heart Cath and Coronary Angiography;  Surgeon: Sherren Mocha, MD;  Location: [place] CV LAB;  Service: Cardiovascular;  Laterality: N/A;  . CARDIAC SURGERY     2 cardiac stents  . CARDIOVERSION N/A 10/20/2015   Procedure:  CARDIOVERSION;  Surgeon: Pixie Casino, MD;  Location: [place];  Service: Cardiovascular;  Laterality: N/A;  . CERVICAL SPINE SURGERY    . EYE SURGERY    . INTRAOPERATIVE TRANSESOPHAGEAL ECHOCARDIOGRAM  10/05/2015   Procedure: INTRAOPERATIVE TRANSESOPHAGEAL ECHOCARDIOGRAM;  Surgeon: Rexene Alberts, MD;  Location: [facility] OR;  Service: Open Heart Surgery;;  . JOINT REPLACEMENT     shoulder  . LEFT HEART CATHETERIZATION WITH CORONARY ANGIOGRAM N/A 11/16/2014   Procedure: LEFT HEART CATHETERIZATION WITH CORONARY ANGIOGRAM;  Surgeon: Troy Sine, MD;  Location: [facility] CATH LAB;  Service: Cardiovascular;  Laterality: N/A;  . MITRAL VALVE REPAIR Right 10/04/2015   Procedure: MINIMALLY INVASIVE MITRAL VALVE REPAIR (MVR);  Surgeon: Rexene Alberts, MD;  Location: [place];  Service: Open Heart Surgery;  Laterality: Right;  . Rt rotator cuff repair    . TEE WITHOUT CARDIOVERSION N/A 06/22/2015   Procedure: TRANSESOPHAGEAL ECHOCARDIOGRAM (TEE);  Surgeon: Skeet Latch, MD;  Location: [place];  Service: Cardiovascular;  Laterality: N/A;  . TEE WITHOUT CARDIOVERSION N/A 10/04/2015   Procedure: TRANSESOPHAGEAL ECHOCARDIOGRAM (TEE);  Surgeon: Rexene Alberts, MD;  Location: [place];  Service: Open Heart Surgery;  Laterality: N/A;  . WOUND EXPLORATION N/A 10/05/2015   Procedure: Sternal Incision;  Surgeon: Rexene Alberts, MD;  Location: [place];  Service: Open Heart Surgery;  Laterality: N/A;    Social History:  reports that he has quit smoking. He has never used smokeless tobacco. He reports that he does not drink alcohol or use drugs.  Family History:  Family History  Problem Relation Age of Onset  . Heart failure Mother 22  . Heart attack Mother   . Heart attack Brother      Prior to Admission medications   Medication Sig Start Date End Date Taking? Authorizing Provider  aspirin EC 81 MG tablet Take 81 mg by mouth every other day. Reported on 12/29/2015   Yes Historical Provider, MD  finasteride  (PROSCAR) 5 MG tablet TAKE ONE (1) TABLET EACH DAY 04/10/16  Yes [name], MD  fluticasone [facility]) 50 MCG/ACT nasal spray Place 1 spray into both nostrils daily as needed for allergies or rhinitis.   Yes Historical Provider, MD  furosemide (LASIX) 40 MG tablet Take 2 tablets (80 mg total) by mouth 2 (two) times daily. 04/22/16  Yes [name], MD  metoprolol tartrate (LOPRESSOR) 25 MG tablet Take 0.5 tablets (12.5 mg total) by mouth 2 (two) times daily. 01/29/16  Yes [name], MD  ondansetron (ZOFRAN) 4 MG tablet Take 1 tablet (4 mg total) by mouth every 8 (eight) hours as needed for nausea or vomiting. 12/21/15  Yes [name], MD  potassium chloride SA (K-DUR,KLOR-CON) 20 MEQ tablet Take 1 tablet (20 mEq total) by mouth daily. 10/31/15  Yes [name], PA  pravastatin (PRAVACHOL) 40 MG tablet Take 1 tablet (40 mg total) by mouth daily at 6 PM. 07/02/16  Yes [name], MD  traMADol (ULTRAM) 50 MG tablet Take  1 tablet (50 mg total) by mouth every 6 (six) hours as needed for moderate pain. 02/29/16  Yes [name], MD  warfarin (COUMADIN) 2 MG tablet Take 1-2 tablets daily or as directed by coumadin clinic 04/12/16  Yes [name], MD    Physical Exam: Vitals:   07/03/16 0039 07/03/16 0212 07/03/16 0312  BP: 138/69 103/85 137/72  Pulse: 70 62 66  Resp: 20 20 24   Temp: 97.5 F (36.4 C)    TempSrc: Oral    SpO2: 100% 96% 96%   General: Not in acute distress HEENT:       Eyes: PERRL, EOMI, no scleral icterus.       ENT: No discharge from the ears and nose, no pharynx injection, no tonsillar enlargement.        Neck: No JVD, no bruit, no mass felt. Heme: No neck lymph node enlargement. Cardiac: [id number], RRR, 2/6 systolic murmurs, No gallops or rubs. Respiratory: No rales, wheezing, rhonchi or rubs. GI: Soft, nondistended, has tenderness in RUQ, no rebound pain, no organomegaly, BS present. GU: No hematuria Ext: has trace leg edema bilaterally.  2+DP/PT pulse bilaterally. Musculoskeletal: No joint deformities, No joint redness or warmth, no limitation of ROM in spin. Skin: No rashes.  Neuro: Alert, oriented X3, cranial nerves II-XII grossly intact, moves all extremities normally.  Psych: Patient is not psychotic, no suicidal or hemocidal ideation.  Labs on Admission: I have personally reviewed following labs and imaging studies  CBC:  Recent Labs Lab 07/02/16 1420 07/03/16 0050  WBC 6.9 7.8  NEUTROABS 4,002  --   HGB 12.8* 12.8*  HCT 39.3 38.2*  MCV 92.5 91.8  PLT 175 0000000   Basic Metabolic Panel:  Recent Labs Lab 07/02/16 1420 07/03/16 0050  NA 138 139  K 3.5 3.6  CL 102 101  CO2 26 28  GLUCOSE 116* 131*  BUN 18 17  CREATININE 1.01 1.20  CALCIUM 8.5* 9.0   GFR: Estimated Creatinine Clearance: 48.1 mL/min (by C-G formula based on SCr of 1.2 mg/dL). Liver Function Tests:  Recent Labs Lab 07/02/16 1420 07/03/16 0050  AST 97* 134*  ALT 93* 104*  ALKPHOS 283* 299*  BILITOT 1.3* 2.9*  PROT 6.5 7.1  ALBUMIN 3.4* 3.1*    Recent Labs Lab 07/02/16 1420 07/03/16 0050  LIPASE 311* 1,675*   No results for input(s): AMMONIA in the last 168 hours. Coagulation Profile:  Recent Labs Lab 07/03/16 0240  INR 2.59   Cardiac Enzymes: No results for input(s): CKTOTAL, CKMB, CKMBINDEX, TROPONINI in the last 168 hours. BNP (last 3 results) No results for input(s): PROBNP in the last 8760 hours. HbA1C: No results for input(s): HGBA1C in the last 72 hours. CBG: No results for input(s): GLUCAP in the last 168 hours. Lipid Profile: No results for input(s): CHOL, HDL, LDLCALC, TRIG, CHOLHDL, LDLDIRECT in the last 72 hours. Thyroid Function Tests: No results for input(s): TSH, T4TOTAL, FREET4, T3FREE, THYROIDAB in the last 72 hours. Anemia Panel: No results for input(s): VITAMINB12, FOLATE, FERRITIN, TIBC, IRON, RETICCTPCT in the last 72 hours. Urine analysis:    Component Value Date/Time   COLORURINE  YELLOW 05/28/2016 1620   APPEARANCEUR CLEAR 05/28/2016 1620   LABSPEC 1.015 05/28/2016 1620   PHURINE 7.0 05/28/2016 1620   GLUCOSEU NEGATIVE 05/28/2016 1620   HGBUR TRACE (A) 05/28/2016 1620   BILIRUBINUR NEGATIVE 05/28/2016 1620   KETONESUR NEGATIVE 05/28/2016 1620   PROTEINUR NEGATIVE 05/28/2016 1620   UROBILINOGEN 1.0 06/04/2015 1926   NITRITE  NEGATIVE 05/28/2016 1620   LEUKOCYTESUR TRACE (A) 05/28/2016 1620   Sepsis Labs: @LABRCNTIP (procalcitonin:4,lacticidven:4) )No results found for this or any previous visit (from the past 240 hour(s)).   Radiological Exams on Admission: US Abdomen Limited Ruq  Result Date: 07/03/2016 CLINICAL DATA:  Acute onset of generalized abdominal pain, nausea and vomiting. Initial encounter. EXAM: US ABDOMEN LIMITED - RIGHT UPPER QUADRANT COMPARISON:  Abdominal ultrasound performed 10/18/2015, and CTA of the abdomen and pelvis performed 08/02/2015 FINDINGS: Gallbladder: Stones are noted dependently within the gallbladder. Gallbladder wall thickening is noted, raising question for chronic inflammation. No pericholecystic fluid is seen. No ultrasonographic Murphy's sign is elicited. Common bile duct: Diameter: 0.5 cm, within normal limits in caliber. Liver: No focal lesion identified. The mildly nodular contour of the liver raises concern for hepatic cirrhosis. Mildly increased parenchymal echogenicity may reflect fatty infiltration. IMPRESSION: 1. No acute abnormality seen at the right upper quadrant. 2. Cholelithiasis. Gallbladder wall thickening raises question for chronic inflammation. No evidence for obstruction or acute cholecystitis. 3. Findings suggest hepatic cirrhosis. Mild fatty infiltration within the liver. Electronically Signed   By: Garald Balding M.D.   On: 07/03/2016 03:03     EKG: Not done in ED, will get one.   Assessment/Plan Principal Problem:   Gallstone pancreatitis Active Problems:   Dyslipidemia   Diabetes mellitus, type II  (HCC)   Pulmonary HTN   PAF (paroxysmal atrial fibrillation) (HCC)   CAD (coronary artery disease)   Chronic diastolic CHF (congestive heart failure) (HCC)   Gallstone pancreatitis:  Lipase 1675. US-abd showed cholelithiasis without signs of cholecystitis. No dilation of common bile duct. No fever or leukocytosis, less likely to have cholecystitis or ascending cholangitis.  -will admit to tele bed as inpt. -prn morphine for pain, Zofran for nausea -IV fluid: Normal saline 100 mL per hour (patient has dCHF, limiting aggressive IV fluid treatment). -please call GI in AM for possible ERCP -hold coumadin now  Atrial Fibrillation: CHA2DS2-VASc Score is 4, needs oral anticoagulation. Patient is on Coumadin at home. INR is 2.59 on admission. Heart rate is well controlled. -continue metoprolol -Hold Coumadin in case patient needed procedure  HLD: Last LDL was 80 on 06/05/15 -Continue home medications: Pravastatin  Diet controlled DM-II: Last A1c 5.75/5/17, well controled. Patient is not taking medication at home -SSI  CAD: no CP. -Continue aspirin, Plavix 70 and her metoprolol  Chronic diastolic CHF (congestive heart failure): 2-D echo 10/18/15 showed EF of 55-60 percent. Patient is on high-dose Lasix 80 mg twice a day currently. Patient has trace amount of leg edema, no JVD. No shortness of breath. CHF is compensated on admission. -Continue metoprolol and aspirin -Hold Lasix since patient needed IV fluid for pancreatitis -Check BNP -Watch volume status closely   DVT ppx: SCD Code Status: Full code Family Communication: None at bed side.   Disposition Plan:  Anticipate discharge back to previous home environment Consults called:  none Admission status: Inpatient/tele     Date of Service 07/03/2016    Ivor Costa [facility] Pager [phone]  If 7PM-7AM, please contact night-coverage [email or web address] Password [email or web address] 07/03/2016, 4:04 AM

## 2016-07-03 NOTE — ED Notes (Signed)
Pt pulled up in bed, pt [id number]

## 2016-07-03 NOTE — Consult Note (Signed)
Subjective:   HPI  The patient is an 82 year old male who presented to the emergency room with complaints of ongoing intermittent right upper quadrant abdominal pain Thanksgiving. He has been having some nausea but no vomiting. An abdominal ultrasound showed gallstones. There was also a nodular contour of the liver. The patient denies drinking alcohol. He feels better at this time and currently is not experiencing abdominal pain. He was found to have an elevated lipase also. Liver enzymes also elevated. Total bilirubin is 3.2, alkaline phosphatase 310, ALT 103, AST 152. Lipase 1675. Patient is on Coumadin.  Review of Systems Denies chest pain or shortness of breath   Past Medical History:  Diagnosis Date  . Chronic diastolic (congestive) heart failure    a. 10/2013 Echo: EF 55-60%, basal-mid anteroseptal and basal-mid inferoseptal HK.  . Chronic fatigue   . Chronic subdural hematoma ([place])    a. 12/2015 Head CT: chronic right holo-hemispheric SDH w/o midline shift.  . Coronary artery disease    a. 11/2014 NSTEMI: DESx 2 placed to LAD and RCA; b. 08/2015 Cath: LAD patent stent, LCX 37m, RCA patent stent, 44m.  . Diabetes mellitus, type II ([place])    a. HgA1c 6.8 in 03/2015  . ED (erectile dysfunction)   . Gout   . Hemangioma of liver 08/02/2015   12 mm enhancing lesion seen on CT - suspicious for benign hemangioma but not diagnostic - MRI recommended  . HLD (hyperlipidemia)   . Hypertensive heart disease   . Kidney stones   . Lower extremity edema    a. chronic  . OA (osteoarthritis)   . Orthostatic hypotension   . PAF (paroxysmal atrial fibrillation) ([place])    a. 10/2015 s/p DCCV;  b. CHA2DS2VASc = 6--> on coumadin; c. 10/2015 Echo: sev dil LA.  Marland Kitchen Physical deconditioning   . Prostate cancer ([place])   . Pulmonary HTN   . Pulmonary hypertension    a. 10/2015 Echo: PASP 41mmHg.  Marland Kitchen Severe mitral regurgitation    a.  10/2015 s/p minimally invasive MV repair; b. 10/2015 Echo: mod MS, mean grad 23mmHg.   Marland Kitchen Spinal stenosis   . SVT (supraventricular tachycardia) (HCC)    a. recurrent, usually responsive to vagal manuevers  . Tricuspid regurgitation    a. 10/2015 Echo: mild to mod TR.   Past Surgical History:  Procedure Laterality Date  . BACK SURGERY    . Back surgery x 2    . CARDIAC CATHETERIZATION N/A 08/08/2015   Procedure: Right/Left Heart Cath and Coronary Angiography;  Surgeon: Sherren Mocha, MD;  Location: [place] CV LAB;  Service: Cardiovascular;  Laterality: N/A;  . CARDIAC SURGERY     2 cardiac stents  . CARDIOVERSION N/A 10/20/2015   Procedure: CARDIOVERSION;  Surgeon: Pixie Casino, MD;  Location: [place];  Service: Cardiovascular;  Laterality: N/A;  . CERVICAL SPINE SURGERY    . EYE SURGERY    . INTRAOPERATIVE TRANSESOPHAGEAL ECHOCARDIOGRAM  10/05/2015   Procedure: INTRAOPERATIVE TRANSESOPHAGEAL ECHOCARDIOGRAM;  Surgeon: Rexene Alberts, MD;  Location: [facility] OR;  Service: Open Heart Surgery;;  . JOINT REPLACEMENT     shoulder  . LEFT HEART CATHETERIZATION WITH CORONARY ANGIOGRAM N/A 11/16/2014   Procedure: LEFT HEART CATHETERIZATION WITH CORONARY ANGIOGRAM;  Surgeon: Troy Sine, MD;  Location: [facility] CATH LAB;  Service: Cardiovascular;  Laterality: N/A;  . MITRAL VALVE REPAIR Right 10/04/2015   Procedure: MINIMALLY INVASIVE MITRAL VALVE REPAIR (MVR);  Surgeon: Rexene Alberts, MD;  Location: [place];  Service: Open Heart Surgery;  Laterality: Right;  . Rt rotator cuff repair    . TEE WITHOUT CARDIOVERSION N/A 06/22/2015   Procedure: TRANSESOPHAGEAL ECHOCARDIOGRAM (TEE);  Surgeon: Skeet Latch, MD;  Location: [place];  Service: Cardiovascular;  Laterality: N/A;  . TEE WITHOUT CARDIOVERSION N/A 10/04/2015   Procedure: TRANSESOPHAGEAL ECHOCARDIOGRAM (TEE);  Surgeon: Rexene Alberts, MD;  Location: [place];  Service: Open Heart Surgery;  Laterality: N/A;  . WOUND EXPLORATION N/A 10/05/2015   Procedure: Sternal Incision;  Surgeon: Rexene Alberts, MD;  Location: [place];   Service: Open Heart Surgery;  Laterality: N/A;   Social History   Social History  . Marital status: Married    Spouse name: N/A  . Number of children: 4  . Years of education: N/A   Occupational History  .  Retired   Social History Main Topics  . Smoking status: Former Research scientist (life sciences)  . Smokeless tobacco: Never Used     Comment: QUIT SMOKING  MANY YEARS AGO"  . Alcohol use No  . Drug use: No  . Sexual activity: Not on file   Other Topics Concern  . Not on file   Social History Narrative   ** Merged History Encounter **       Four adopted children.  Lives alone.    family history includes Heart attack in his brother and mother; Heart failure (age of onset: 34) in his mother.  Current Facility-Administered Medications:  .  [START ON 07/04/2016] aspirin EC tablet 81 mg, 81 mg, Oral, QODAY, Ivor Costa, MD .  dextrose 5 % and 0.9 % NaCl with KCl 20 mEq/L infusion, , Intravenous, Continuous, Florencia Reasons, MD .  finasteride (PROSCAR) tablet 5 mg, 5 mg, Oral, Daily, Ivor Costa, MD .  fluticasone (FLONASE) 50 MCG/ACT nasal spray 1 spray, 1 spray, Each Nare, Daily PRN, Ivor Costa, MD .  insulin aspart (novoLOG) injection 0-5 Units, 0-5 Units, Subcutaneous, QHS, Ivor Costa, MD .  insulin aspart (novoLOG) injection 0-9 Units, 0-9 Units, Subcutaneous, TID WC, Ivor Costa, MD .  metoprolol tartrate (LOPRESSOR) tablet 12.5 mg, 12.5 mg, Oral, BID, Ivor Costa, MD .  morphine 4 MG/ML injection 2 mg, 2 mg, Intravenous, Q4H PRN, Ivor Costa, MD .  ondansetron [facility]) injection 4 mg, 4 mg, Intravenous, Q8H PRN, Ivor Costa, MD .  pravastatin (PRAVACHOL) tablet 40 mg, 40 mg, Oral, q1800, Ivor Costa, MD .  sodium chloride flush (NS) 0.9 % injection 3 mL, 3 mL, Intravenous, Q12H, Ivor Costa, MD .  zolpidem (AMBIEN) tablet 5 mg, 5 mg, Oral, QHS PRN, Ivor Costa, MD  Current Outpatient Prescriptions:  .  aspirin EC 81 MG tablet, Take 81 mg by mouth every other day. Reported on 12/29/2015, Disp: , Rfl:  .  finasteride  (PROSCAR) 5 MG tablet, TAKE ONE (1) TABLET EACH DAY, Disp: 30 tablet, Rfl: 11 .  fluticasone (FLONASE) 50 MCG/ACT nasal spray, Place 1 spray into both nostrils daily as needed for allergies or rhinitis., Disp: , Rfl:  .  furosemide (LASIX) 40 MG tablet, Take 2 tablets (80 mg total) by mouth 2 (two) times daily., Disp: 60 tablet, Rfl: 3 .  metoprolol tartrate (LOPRESSOR) 25 MG tablet, Take 0.5 tablets (12.5 mg total) by mouth 2 (two) times daily., Disp: 60 tablet, Rfl: 3 .  ondansetron (ZOFRAN) 4 MG tablet, Take 1 tablet (4 mg total) by mouth every 8 (eight) hours as needed for nausea or vomiting., Disp: 20 tablet, Rfl: 0 .  potassium chloride SA (K-DUR,KLOR-CON)  20 MEQ tablet, Take 1 tablet (20 mEq total) by mouth daily., Disp: 30 tablet, Rfl: 1 .  pravastatin (PRAVACHOL) 40 MG tablet, Take 1 tablet (40 mg total) by mouth daily at 6 PM., Disp: 30 tablet, Rfl: 11 .  traMADol (ULTRAM) 50 MG tablet, Take 1 tablet (50 mg total) by mouth every 6 (six) hours as needed for moderate pain., Disp: 30 tablet, Rfl: 0 .  warfarin (COUMADIN) 2 MG tablet, Take 1-2 tablets daily or as directed by coumadin clinic, Disp: 60 tablet, Rfl: 1 No Known Allergies   Objective:     BP 157/99   Pulse 84   Temp 97.5 F (36.4 C) (Oral)   Resp 24   SpO2 100%   He does not appear in any acute distress  Heart regular rhythm, grade 2/6 systolic murmur  Lungs clear  Abdomen: Bowel sounds present, soft, nontender at this time  Laboratory No components found for: D1    Assessment:     Gallstone pancreatitis  Multiple medical problems including cardiac disease on Coumadin      Plan:     Supportive care at this time for a cryptitis with medical treatment initially. Follow LFTs and lipase. Further decisions will be made in regards to ERCP versus MRCP versus cholecystectomy with intraoperative cholangiogram. Coumadin has been held. Would recommend cardiology consult for cardiac clearance. We will follow.

## 2016-07-03 NOTE — ED Notes (Signed)
Pt aware of need for urine sample. Pt unable to void at this time, urinal at bedside.

## 2016-07-03 NOTE — ED Notes (Signed)
Attempted report 

## 2016-07-04 ENCOUNTER — Encounter (HOSPITAL_COMMUNITY): Payer: Self-pay

## 2016-07-04 ENCOUNTER — Inpatient Hospital Stay (HOSPITAL_COMMUNITY): Payer: Medicare Other

## 2016-07-04 DIAGNOSIS — I5032 Chronic diastolic (congestive) heart failure: Secondary | ICD-10-CM

## 2016-07-04 DIAGNOSIS — Z01810 Encounter for preprocedural cardiovascular examination: Secondary | ICD-10-CM

## 2016-07-04 DIAGNOSIS — I2510 Atherosclerotic heart disease of native coronary artery without angina pectoris: Secondary | ICD-10-CM

## 2016-07-04 DIAGNOSIS — K851 Biliary acute pancreatitis without necrosis or infection: Principal | ICD-10-CM

## 2016-07-04 LAB — COMPREHENSIVE METABOLIC PANEL
ALT: 102 U/L — ABNORMAL HIGH (ref 17–63)
AST: 126 U/L — ABNORMAL HIGH (ref 15–41)
Albumin: 2.6 g/dL — ABNORMAL LOW (ref 3.5–5.0)
Alkaline Phosphatase: 314 U/L — ABNORMAL HIGH (ref 38–126)
Anion gap: 7 (ref 5–15)
BUN: 12 mg/dL (ref 6–20)
CO2: 26 mmol/L (ref 22–32)
Calcium: 8.8 mg/dL — ABNORMAL LOW (ref 8.9–10.3)
Chloride: 106 mmol/L (ref 101–111)
Creatinine, Ser: 1.01 mg/dL (ref 0.61–1.24)
GFR calc Af Amer: >60 mL/min (ref >60)
GFR calc non Af Amer: >60 mL/min (ref >60)
Glucose, Bld: 116 mg/dL — ABNORMAL HIGH (ref 65–99)
Potassium: 3.2 mmol/L — ABNORMAL LOW (ref 3.5–5.1)
Sodium: 139 mmol/L (ref 135–145)
Total Bilirubin: 2.9 mg/dL — ABNORMAL HIGH (ref 0.3–1.2)
Total Protein: 6 g/dL — ABNORMAL LOW (ref 6.5–8.1)

## 2016-07-04 LAB — CBC
HEMATOCRIT: 36.4 % — AB (ref 39.0–52.0)
Hemoglobin: 11.9 g/dL — ABNORMAL LOW (ref 13.0–17.0)
MCH: 30.1 pg (ref 26.0–34.0)
MCHC: 32.7 g/dL (ref 30.0–36.0)
MCV: 92.2 fL (ref 78.0–100.0)
Platelets: 149 10*3/uL — ABNORMAL LOW (ref 150–400)
RBC: 3.95 MIL/uL — ABNORMAL LOW (ref 4.22–5.81)
RDW: 15.1 % (ref 11.5–15.5)
WBC: 6.3 10*3/uL (ref 4.0–10.5)

## 2016-07-04 LAB — GLUCOSE, CAPILLARY
Glucose-Capillary: 113 mg/dL — ABNORMAL HIGH (ref 65–99)
Glucose-Capillary: 108 mg/dL — ABNORMAL HIGH (ref 65–99)
Glucose-Capillary: 117 mg/dL — ABNORMAL HIGH (ref 65–99)
Glucose-Capillary: 113 mg/dL — ABNORMAL HIGH (ref 65–99)

## 2016-07-04 LAB — MAGNESIUM: Magnesium: 2.1 mg/dL (ref 1.7–2.4)

## 2016-07-04 LAB — PROTIME-INR
INR: 2.32
Prothrombin Time: 25.8 seconds — ABNORMAL HIGH (ref 11.4–15.2)

## 2016-07-04 LAB — LIPASE, BLOOD: LIPASE: 39 U/L (ref 11–51)

## 2016-07-04 MED ORDER — GADOBENATE DIMEGLUMINE 529 MG/ML IV SOLN
20.0000 mL | Freq: Once | INTRAVENOUS | Status: AC
  Administered 2016-07-04: 19 mL via INTRAVENOUS

## 2016-07-04 MED ORDER — POTASSIUM CHLORIDE CRYS ER 20 MEQ PO TBCR
40.0000 meq | EXTENDED_RELEASE_TABLET | Freq: Once | ORAL | Status: AC
  Administered 2016-07-04: 40 meq via ORAL
  Filled 2016-07-04: qty 2

## 2016-07-04 NOTE — Consult Note (Signed)
   [facility] CM Inpatient Consult   07/04/2016  WELDON ERLINGER [birth year] [id number]   Patient had been previously active with [facility].  Patient was resting when rounded.  Will follow up for discharge planning needs for [facility] Management.  For questions, please contact:  Natividad Brood, RN BSN [facility] Liaison  [phone] business mobile phone Toll free office [phone]

## 2016-07-04 NOTE — Progress Notes (Signed)
[facility] Progress Note  Subjective: No complaints of abdominal pain  Objective: Vital signs in last 24 hours: Temp:  [97.7 F (36.5 C)-98.4 F (36.9 C)] 98.4 F (36.9 C) (11/30 0516) Pulse Rate:  [57-105] 87 (11/30 0516) Resp:  [10-87] 87 (11/30 0516) BP: (127-151)/(68-85) 151/85 (11/30 0516) SpO2:  [89 %-100 %] 94 % (11/30 0516) Weight:  [88.9 kg (195 lb 15.8 oz)-89.2 kg (196 lb 10.4 oz)] 88.9 kg (195 lb 15.8 oz) (11/30 0516) Weight change:    PE:  He is in no distress  Heart regular rhythm  Lungs clear  Abdomen soft and nontender  Lipase has normalized  Not much change and LFTs  Lab Results: Results for orders placed or performed during the hospital encounter of 07/03/16 (from the past 24 hour(s))  CBG monitoring, ED     Status: Abnormal   Collection Time: 07/03/16 11:54 AM  Result Value Ref Range   Glucose-Capillary 100 (H) 65 - 99 mg/dL   Comment 1 Notify RN    Comment 2 Document in Chart   Glucose, capillary     Status: None   Collection Time: 07/03/16  5:40 PM  Result Value Ref Range   Glucose-Capillary 90 65 - 99 mg/dL  Glucose, capillary     Status: Abnormal   Collection Time: 07/03/16  9:19 PM  Result Value Ref Range   Glucose-Capillary 108 (H) 65 - 99 mg/dL   Comment 1 Notify RN    Comment 2 Document in Chart   CBC     Status: Abnormal   Collection Time: 07/04/16  2:48 AM  Result Value Ref Range   WBC 6.3 4.0 - 10.5 K/uL   RBC 3.95 (L) 4.22 - 5.81 MIL/uL   Hemoglobin 11.9 (L) 13.0 - 17.0 g/dL   HCT 36.4 (L) 39.0 - 52.0 %   MCV 92.2 78.0 - 100.0 fL   MCH 30.1 26.0 - 34.0 pg   MCHC 32.7 30.0 - 36.0 g/dL   RDW 15.1 11.5 - 15.5 %   Platelets 149 (L) 150 - 400 K/uL  Comprehensive metabolic panel     Status: Abnormal   Collection Time: 07/04/16  2:48 AM  Result Value Ref Range   Sodium 139 135 - 145 mmol/L   Potassium 3.2 (L) 3.5 - 5.1 mmol/L   Chloride 106 101 - 111 mmol/L   CO2 26 22 - 32 mmol/L   Glucose, Bld 116 (H) 65 - 99  mg/dL   BUN 12 6 - 20 mg/dL   Creatinine, Ser 1.01 0.61 - 1.24 mg/dL   Calcium 8.8 (L) 8.9 - 10.3 mg/dL   Total Protein 6.0 (L) 6.5 - 8.1 g/dL   Albumin 2.6 (L) 3.5 - 5.0 g/dL   AST 126 (H) 15 - 41 U/L   ALT 102 (H) 17 - 63 U/L   Alkaline Phosphatase 314 (H) 38 - 126 U/L   Total Bilirubin 2.9 (H) 0.3 - 1.2 mg/dL   GFR calc non Af Amer >60 >60 mL/min   GFR calc Af Amer >60 >60 mL/min   Anion gap 7 5 - 15  Magnesium     Status: None   Collection Time: 07/04/16  2:48 AM  Result Value Ref Range   Magnesium 2.1 1.7 - 2.4 mg/dL  Protime-INR     Status: Abnormal   Collection Time: 07/04/16  2:48 AM  Result Value Ref Range   Prothrombin Time 25.8 (H) 11.4 - 15.2 seconds   INR 2.32   Lipase, blood  Status: None   Collection Time: 07/04/16  2:48 AM  Result Value Ref Range   Lipase 39 11 - 51 U/L  Glucose, capillary     Status: Abnormal   Collection Time: 07/04/16  8:03 AM  Result Value Ref Range   Glucose-Capillary 113 (H) 65 - 99 mg/dL    Studies/Results: No results found.    Assessment: Gallstone pancreatitis  Plan:   Clear liquids today. Obtain MRCP. INR still elevated from Coumadin which has been held. After reviewing MRCP we will make further decisions.    [name] 07/04/2016, 10:31 AM  Pager: [phone] If no answer or after 5 PM call [phone]

## 2016-07-04 NOTE — Progress Notes (Signed)
Offered bath, Pt stated he would like for wife to assist with bath. Tech gave Pt all bath supplies.

## 2016-07-04 NOTE — Care Management Note (Signed)
Case Management Note  Patient Details  Name: [name] MRN: [id number] Date of Birth: [birth year]  Subjective/Objective:    Gallstone pancreatitis                Action/Plan: Discharge Planning: NCM spoke to pt and wife, Inez Catalina at bedside. Offered choice for HH/list provided. Pt states he had [facility] in the past. Has RW, wheelchair, cane and bedside commode at home. Wife at home to assist with care. Contacted [facility] with new referral. Waiting PT recommendations for home. Pt states he does not want SNF. Message to attending for HH/F2F orders.   PCP [name] MD    Expected Discharge Date:                Expected Discharge Plan:  [place]  In-House Referral:  NA  Discharge planning Services  CM Consult  Post Acute Care Choice:  Home Health Choice offered to:  Patient  DME Arranged:  N/A DME Agency:  NA  HH Arranged:  PT [place] Agency:  [facility]  Status of Service:  In process, will continue to follow  If discussed at Long Length of Stay Meetings, dates discussed:    Additional Comments:  Erenest Rasher, RN 07/04/2016, 12:21 PM

## 2016-07-04 NOTE — Progress Notes (Signed)
PROGRESS NOTE  [name] [id number] DOB: [birth year] DOA: 07/03/2016 PCP: [name], MD  HPI/Recap of past 24 hours:  Denies pain, no n/v  Assessment/Plan: Principal Problem:   Gallstone pancreatitis Active Problems:   Dyslipidemia   Diabetes mellitus, type II ([place])   Pulmonary HTN   PAF (paroxysmal atrial fibrillation) (HCC)   CAD (coronary artery disease)   Chronic diastolic CHF (congestive heart failure) ([place])   Gallstone pancreatitis:  Lipase 1675 on admission. US-abd showed cholelithiasis without signs of cholecystitis. No dilation of common bile duct. No fever or leukocytosis, less likely to have cholecystitis or ascending cholangitis.  -IV fluid:  (patient has dCHF, limiting aggressive IV fluid treatment). -coumadin held since admission, inr 2.3 on 11/30 -lft remain elevated, lipase normalized on 11/30 -[facility] consulted who recommended cardiology consult for procedure clearance, cardiology paged  Paroxysmal Atrial Fibrillation: CHA2DS2-VASc Score is 4,  Patient is on Coumadin at home. INR is 2.59 on admission. Sinus rhythm since admission, Heart rate is well controlled. -continue metoprolol -Hold Coumadin in case patient needed procedure -cardiology consulted   CAD: with h/o NSTEMI, s/p stent in 2016 no CP. -Continue aspirin, metoprolol, statin  Chronic diastolic CHF (congestive heart failure), h/o MV repair in 10/2015 2-D echo 10/18/15 showed EF of 55-60 percent. Patient is on high-dose Lasix 80 mg twice a day currently. Patient has trace amount of leg edema, no JVD. No shortness of breath. CHF is compensated on admission. -Continue metoprolol and aspirin -Hold Lasix since patient needed IV fluid for pancreatitis -BNP 617  -Watch volume status closely, cardiology consulted  HLD: Last LDL was 80 on 06/05/15 -Continue home medications: Pravastatin  Diet controlled DM-II: Last A1c 5.75/5/17, well controled. Patient is not taking  medication at home -SSI  H/o chronic subdural hematoma, stable, no headache  DVT ppx: SCD Code Status: Full code Family Communication: None at bed side.   Disposition Plan:  Anticipate discharge back to previous home environment Consults called:   [facility]   Procedures:  none  Antibiotics:  none   Objective: BP (!) 151/85 (BP Location: Right Arm)   Pulse 87   Temp 98.4 F (36.9 C) (Oral)   Resp (!) 87   Ht 5\' 10"  (1.778 m)   Wt 88.9 kg (195 lb 15.8 oz)   SpO2 94%   BMI 28.12 kg/m   Intake/Output Summary (Last 24 hours) at 07/04/16 0756 Last data filed at 07/04/16 0700  Gross per 24 hour  Intake              980 ml  Output              575 ml  Net              405 ml   Filed Weights   07/03/16 1735 07/04/16 0516  Weight: 89.2 kg (196 lb 10.4 oz) 88.9 kg (195 lb 15.8 oz)    Exam:   General:  NAD  Cardiovascular: RRR, + mumur  Respiratory: CTABL  Abdomen: Soft/ND/NT, positive BS  Musculoskeletal: No Edema  Neuro: aaox3, slow in answering questions though  Data Reviewed: Basic Metabolic Panel:  Recent Labs Lab 07/02/16 1420 07/03/16 0050 07/03/16 0405 07/04/16 0248  NA 138 139 138 139  K 3.5 3.6 3.3* 3.2*  CL 102 101 102 106  CO2 26 28 24 26   GLUCOSE 116* 131* 129* 116*  BUN 18 17 18 12   CREATININE 1.01 1.20 1.11 1.01  CALCIUM 8.5* 9.0 8.9  8.8*  MG  --   --   --  2.1   Liver Function Tests:  Recent Labs Lab 07/02/16 1420 07/03/16 0050 07/03/16 0405 07/04/16 0248  AST 97* 134* 152* 126*  ALT 93* 104* 103* 102*  ALKPHOS 283* 299* 310* 314*  BILITOT 1.3* 2.9* 3.2* 2.9*  PROT 6.5 7.1 6.8 6.0*  ALBUMIN 3.4* 3.1* 2.9* 2.6*    Recent Labs Lab 07/02/16 1420 07/03/16 0050  LIPASE 311* 1,675*   No results for input(s): AMMONIA in the last 168 hours. CBC:  Recent Labs Lab 07/02/16 1420 07/03/16 0050 07/03/16 0405 07/04/16 0248  WBC 6.9 7.8 7.2 6.3  NEUTROABS 4,002  --   --   --   HGB 12.8* 12.8* 12.2* 11.9*    HCT 39.3 38.2* 36.9* 36.4*  MCV 92.5 91.8 91.8 92.2  PLT 175 158 149* 149*   Cardiac Enzymes:   No results for input(s): CKTOTAL, CKMB, CKMBINDEX, TROPONINI in the last 168 hours. BNP (last 3 results)  Recent Labs  12/08/15 1709 12/08/15 2209 07/03/16 0355  BNP 1,374.7* 1,647.0* 617.4*    ProBNP (last 3 results) No results for input(s): PROBNP in the last 8760 hours.  CBG:  Recent Labs Lab 07/03/16 0757 07/03/16 1154 07/03/16 1740 07/03/16 2119  GLUCAP 101* 100* 90 108*    No results found for this or any previous visit (from the past 240 hour(s)).   Studies: No results found.  Scheduled Meds: . aspirin EC  81 mg Oral QODAY  . finasteride  5 mg Oral Daily  . Influenza vac split quadrivalent PF  0.5 mL Intramuscular Tomorrow-1000  . insulin aspart  0-5 Units Subcutaneous QHS  . insulin aspart  0-9 Units Subcutaneous TID WC  . metoprolol tartrate  12.5 mg Oral BID  . potassium chloride  40 mEq Oral Once  . pravastatin  40 mg Oral q1800  . sodium chloride flush  3 mL Intravenous Q12H    Continuous Infusions: . dextrose 5 % and 0.9 % NaCl with KCl 20 mEq/L 50 mL/hr at 07/04/16 0700     Time spent: 31mins  Jaquisha Frech MD, PhD  [facility] Pager [phone]. If 7PM-7AM, please contact night-coverage at [email or web address], password [facility] 07/04/2016, 7:56 AM  LOS: 1 day

## 2016-07-04 NOTE — Consult Note (Signed)
Cardiology Consult    Patient ID: [name] MRN: [id number], DOB/AGE: [birth year]   Admit date: 07/03/2016 Date of Consult: 07/04/2016  Primary Physician: [name], MD Primary Cardiologist: Dr. Percival Spanish Requesting Provider: Dr. Erlinda Hong Reason for Consultation: Preop Clearance  Patient Profile    82 yo male with PMH of CAD s/p DES to LAD and RCA (11/2014), severe MR s/p MVR, AF, HTN, DM II, subdural hematoma and diastolic HF who presented with abd pain, found to pancreatitis and cryptitis. GI consulted and plans for possible cholecystectomy with cardiac clearance needed.   Past Medical History   Past Medical History:  Diagnosis Date  . Chronic diastolic (congestive) heart failure    a. 10/2013 Echo: EF 55-60%, basal-mid anteroseptal and basal-mid inferoseptal HK.  . Chronic fatigue   . Chronic subdural hematoma ([place])    a. 12/2015 Head CT: chronic right holo-hemispheric SDH w/o midline shift.  . Coronary artery disease    a. 11/2014 NSTEMI: DESx 2 placed to LAD and RCA; b. 08/2015 Cath: LAD patent stent, LCX 47m, RCA patent stent, 28m.  . Diabetes mellitus, type II ([place])    a. HgA1c 6.8 in 03/2015  . ED (erectile dysfunction)   . Gout   . Hemangioma of liver 08/02/2015   12 mm enhancing lesion seen on CT - suspicious for benign hemangioma but not diagnostic - MRI recommended  . HLD (hyperlipidemia)   . Hypertensive heart disease   . Kidney stones   . Lower extremity edema    a. chronic  . OA (osteoarthritis)   . Orthostatic hypotension   . PAF (paroxysmal atrial fibrillation) ([place])    a. 10/2015 s/p DCCV;  b. CHA2DS2VASc = 6--> on coumadin; c. 10/2015 Echo: sev dil LA.  Marland Kitchen Physical deconditioning   . Prostate cancer ([place])   . Pulmonary HTN   . Pulmonary hypertension    a. 10/2015 Echo: PASP 68mmHg.  Marland Kitchen Severe mitral regurgitation    a.  10/2015 s/p minimally invasive MV repair; b. 10/2015 Echo: mod MS, mean grad 47mmHg.  Marland Kitchen Spinal stenosis   . SVT (supraventricular  tachycardia) (HCC)    a. recurrent, usually responsive to vagal manuevers  . Tricuspid regurgitation    a. 10/2015 Echo: mild to mod TR.    Past Surgical History:  Procedure Laterality Date  . BACK SURGERY    . Back surgery x 2    . CARDIAC CATHETERIZATION N/A 08/08/2015   Procedure: Right/Left Heart Cath and Coronary Angiography;  Surgeon: Sherren Mocha, MD;  Location: [place];  Service: Cardiovascular;  Laterality: N/A;  . CARDIAC SURGERY     2 cardiac stents  . CARDIOVERSION N/A 10/20/2015   Procedure: CARDIOVERSION;  Surgeon: Pixie Casino, MD;  Location: [facility];  Service: Cardiovascular;  Laterality: N/A;  . CERVICAL SPINE SURGERY    . EYE SURGERY    . INTRAOPERATIVE TRANSESOPHAGEAL ECHOCARDIOGRAM  10/05/2015   Procedure: INTRAOPERATIVE TRANSESOPHAGEAL ECHOCARDIOGRAM;  Surgeon: Rexene Alberts, MD;  Location: [facility] OR;  Service: Open Heart Surgery;;  . JOINT REPLACEMENT     shoulder  . LEFT HEART CATHETERIZATION WITH CORONARY ANGIOGRAM N/A 11/16/2014   Procedure: LEFT HEART CATHETERIZATION WITH CORONARY ANGIOGRAM;  Surgeon: Troy Sine, MD;  Location: [facility] CATH LAB;  Service: Cardiovascular;  Laterality: N/A;  . MITRAL VALVE REPAIR Right 10/04/2015   Procedure: MINIMALLY INVASIVE MITRAL VALVE REPAIR (MVR);  Surgeon: Rexene Alberts, MD;  Location: [facility];  Service: Open Heart Surgery;  Laterality: Right;  .  Rt rotator cuff repair    . TEE WITHOUT CARDIOVERSION N/A 06/22/2015   Procedure: TRANSESOPHAGEAL ECHOCARDIOGRAM (TEE);  Surgeon: Skeet Latch, MD;  Location: [place];  Service: Cardiovascular;  Laterality: N/A;  . TEE WITHOUT CARDIOVERSION N/A 10/04/2015   Procedure: TRANSESOPHAGEAL ECHOCARDIOGRAM (TEE);  Surgeon: Rexene Alberts, MD;  Location: [facility];  Service: Open Heart Surgery;  Laterality: N/A;  . WOUND EXPLORATION N/A 10/05/2015   Procedure: Sternal Incision;  Surgeon: Rexene Alberts, MD;  Location: [place];  Service: Open Heart Surgery;  Laterality: N/A;       Allergies  No Known Allergies  History of Present Illness    [name] is a 82 yo male with PMH of CAD s/p DES to LAD and RCA (11/2014), severe MR s/p MVR, AF, HTN, DM II, subdural hematoma and diastolic HF. He was last seen in the office on 6/17 by Dr. Percival Spanish for hospital follow up. Reported feeling well at that visit.   Echo back in 3/17 showed EF of 55-60%, with hypokinesis of the basal-mid anteroseptal and base-mid inferoseptal myocardium. Mitral valve was stable.   Reports he has been in his usual state of health until Thanksgiving. Has not had any anginal symptoms or dyspnea. Reports he is normally physically active without symptoms. Reports he developed abd pain in the right upper quadrant pain. Presented to the ED on 11/29 found to have a lipase 1675, INR 2.59, WBC 7.8, Cr 1.01. Abd US showed cholelithiasis without signs of cholecystitis. GI was consulted. Plans for MRCP today, with possible surgical intervention. Cardiology has been called for clearance.   Inpatient Medications    . aspirin EC  81 mg Oral QODAY  . finasteride  5 mg Oral Daily  . Influenza vac split quadrivalent PF  0.5 mL Intramuscular Tomorrow-1000  . insulin aspart  0-5 Units Subcutaneous QHS  . insulin aspart  0-9 Units Subcutaneous TID WC  . metoprolol tartrate  12.5 mg Oral BID  . pravastatin  40 mg Oral q1800  . sodium chloride flush  3 mL Intravenous Q12H    Family History    Family History  Problem Relation Age of Onset  . Heart failure Mother 37  . Heart attack Mother   . Heart attack Brother     Social History    Social History   Social History  . Marital status: Married    Spouse name: N/A  . Number of children: 4  . Years of education: N/A   Occupational History  .  Retired   Social History Main Topics  . Smoking status: Former Research scientist (life sciences)  . Smokeless tobacco: Never Used     Comment: QUIT SMOKING  MANY YEARS AGO"  . Alcohol use No  . Drug use: No  . Sexual activity: Not on  file   Other Topics Concern  . Not on file   Social History Narrative   ** Merged History Encounter **       Four adopted children.  Lives alone.      Review of Systems    General:  No chills, fever, night sweats or weight changes.  Cardiovascular:  No chest pain, dyspnea on exertion, edema, orthopnea, palpitations, paroxysmal nocturnal dyspnea. Dermatological: No rash, lesions/masses Respiratory: No cough, dyspnea Urologic: No hematuria, dysuria Abdominal:   See HPI Neurologic:  No visual changes, wkns, changes in mental status. All other systems reviewed and are otherwise negative except as noted above.  Physical Exam    Blood pressure (!) 151/85,  pulse 87, temperature 98.4 F (36.9 C), temperature source Oral, resp. rate (!) 87, height 5\' 10"  (1.778 m), weight 195 lb 15.8 oz (88.9 kg), SpO2 94 %.  General: Pleasant older male, NAD Psych: Normal affect. Neuro: Alert and oriented X 3. Moves all extremities spontaneously. HEENT: Normal  Neck: Supple without bruits or JVD. Lungs:  Resp regular and unlabored, CTA. Heart: RRR no s3, s4, or murmurs. Abdomen: Soft, non-tender, non-distended, BS + x 4.  Extremities: No clubbing, cyanosis, trace bilateral LE edema. DP/PT/Radials 2+ and equal bilaterally.  Labs    Troponin (Point of Care Test) No results for input(s): TROPIPOC in the last 72 hours. No results for input(s): CKTOTAL, CKMB, TROPONINI in the last 72 hours. Lab Results  Component Value Date   WBC 6.3 07/04/2016   HGB 11.9 (L) 07/04/2016   HCT 36.4 (L) 07/04/2016   MCV 92.2 07/04/2016   PLT 149 (L) 07/04/2016    Recent Labs Lab 07/04/16 0248  NA 139  K 3.2*  CL 106  CO2 26  BUN 12  CREATININE 1.01  CALCIUM 8.8*  PROT 6.0*  BILITOT 2.9*  ALKPHOS 314*  ALT 102*  AST 126*  GLUCOSE 116*   Lab Results  Component Value Date   CHOL 158 06/05/2015   HDL 31 (L) 06/05/2015   LDLCALC 80 06/05/2015   TRIG 235 (H) 06/05/2015   No results found for:  [facility]   Radiology Studies    US Abdomen Limited Ruq  Result Date: 07/03/2016 CLINICAL DATA:  Acute onset of generalized abdominal pain, nausea and vomiting. Initial encounter. EXAM: US ABDOMEN LIMITED - RIGHT UPPER QUADRANT COMPARISON:  Abdominal ultrasound performed 10/18/2015, and CTA of the abdomen and pelvis performed 08/02/2015 FINDINGS: Gallbladder: Stones are noted dependently within the gallbladder. Gallbladder wall thickening is noted, raising question for chronic inflammation. No pericholecystic fluid is seen. No ultrasonographic Murphy's sign is elicited. Common bile duct: Diameter: 0.5 cm, within normal limits in caliber. Liver: No focal lesion identified. The mildly nodular contour of the liver raises concern for hepatic cirrhosis. Mildly increased parenchymal echogenicity may reflect fatty infiltration. IMPRESSION: 1. No acute abnormality seen at the right upper quadrant. 2. Cholelithiasis. Gallbladder wall thickening raises question for chronic inflammation. No evidence for obstruction or acute cholecystitis. 3. Findings suggest hepatic cirrhosis. Mild fatty infiltration within the liver. Electronically Signed   By: Garald Balding M.D.   On: 07/03/2016 03:03    ECG & Cardiac Imaging    EKG: SR  Echo: 3/17  Study Conclusions  - Left ventricle: The cavity size was normal. There was severe   focla hypertrophy of the basal septum and mild hypertrophy of the   posterior wall. Systolic function was normal. The estimated   ejection fraction was in the range of 55% to 60%. The study is   not technically sufficient to allow evaluation of LV diastolic   function. - Regional wall motion abnormality: Hypokinesis of the basal-mid   anteroseptal and basal-mid inferoseptal myocardium. - Aortic valve: There was mild stenosis. There was trivial   regurgitation. Peak velocity (S): 284 cm/s. Mean gradient (S): 16   mm Hg. - Mitral valve: Prior procedures included surgical repair with a  30   mm Sorin Memo 3D annuloplasty ring. The sewing ring had no   rocking motion and showed no evidence of dehiscence. The findings   are consistent with moderate stenosis. Mean gradient (D): 6 mm   Hg. Gradient relatively unchanged from prior. - Left atrium: The  atrium was severely dilated. - Right ventricle: Systolic function was mildly reduced. - Right atrium: The atrium was moderately dilated. - Atrial septum: No defect or patent foramen ovale was identified   by color flow Doppler. - Tricuspid valve: There was mild-moderate regurgitation. - Pulmonic valve: There was mild regurgitation. - Pulmonary arteries: Systolic pressure was moderately to severely   increased. PA peak pressure: 47 mm Hg (S). - Inferior vena cava: The vessel was dilated. The respirophasic   diameter changes were blunted (< 50%), consistent with elevated   central venous pressure. - Pericardium, extracardiac: A trivial pericardial effusion was   identified.  Assessment & Plan    82 yo male with PMH of CAD s/p DES to LAD and RCA (11/2014), severe MR s/p MVR, AF, HTN, DM II, subdural hematoma and diastolic HF who presented with abd pain, found to pancreatitis and cryptitis. GI consulted and plans for possible cholecystectomy with cardiac clearance needed.   Preop Clearance: Reports he has been in his usual state of health. Physically active without anginal symptoms or dyspnea. Has a hx of PAF, on coumadin for anticoagulation. In SR on admission. Last echo 3/17 showed normal EF. He does not appear volume overloaded on exam, extremity with trace edema. Coumadin has been held, INR 2.32 today. BNP was elevated at 617 on admission. Lasix is currently being held by primary. Would to watch his volume status closely as he takes 80mg  BID. Cr is stable.  -- Does not seem that he further work up at this time. Will defer clearance to MD.    Signed, Reino Bellis, NP-C Pager [phone] 07/04/2016, 1:21 PM   I have examined  the patient and reviewed assessment and plan and discussed with patient.  Agree with above as stated.  Patient with CAD and MV repair.  No angina with regular activity.  He is able to lie flat.   No further cardiac testing needed before surgery.  He is at low to moderate risk of cardiac complication mostly because of his age. Watch fluid status carefully given h/o diastolic heart failure.   Larae Grooms

## 2016-07-05 ENCOUNTER — Inpatient Hospital Stay (HOSPITAL_COMMUNITY): Payer: Medicare Other

## 2016-07-05 LAB — PROTIME-INR
INR: 2.06
Prothrombin Time: 23.5 seconds — ABNORMAL HIGH (ref 11.4–15.2)

## 2016-07-05 LAB — GLUCOSE, CAPILLARY
GLUCOSE-CAPILLARY: 118 mg/dL — AB (ref 65–99)
Glucose-Capillary: 158 mg/dL — ABNORMAL HIGH (ref 65–99)
Glucose-Capillary: 132 mg/dL — ABNORMAL HIGH (ref 65–99)
Glucose-Capillary: 118 mg/dL — ABNORMAL HIGH (ref 65–99)

## 2016-07-05 LAB — COMPREHENSIVE METABOLIC PANEL
ALT: 75 U/L — ABNORMAL HIGH (ref 17–63)
ANION GAP: 8 (ref 5–15)
AST: 76 U/L — ABNORMAL HIGH (ref 15–41)
Albumin: 2.4 g/dL — ABNORMAL LOW (ref 3.5–5.0)
Alkaline Phosphatase: 278 U/L — ABNORMAL HIGH (ref 38–126)
BILIRUBIN TOTAL: 2.6 mg/dL — AB (ref 0.3–1.2)
BUN: 10 mg/dL (ref 6–20)
CO2: 24 mmol/L (ref 22–32)
Calcium: 8.9 mg/dL (ref 8.9–10.3)
Chloride: 105 mmol/L (ref 101–111)
Creatinine, Ser: 0.93 mg/dL (ref 0.61–1.24)
GFR calc Af Amer: >60 mL/min (ref >60)
Glucose, Bld: 115 mg/dL — ABNORMAL HIGH (ref 65–99)
POTASSIUM: 3.8 mmol/L (ref 3.5–5.1)
Sodium: 137 mmol/L (ref 135–145)
TOTAL PROTEIN: 5.7 g/dL — AB (ref 6.5–8.1)

## 2016-07-05 LAB — MAGNESIUM: MAGNESIUM: 1.9 mg/dL (ref 1.7–2.4)

## 2016-07-05 LAB — LIPASE, BLOOD: Lipase: 29 U/L (ref 11–51)

## 2016-07-05 MED ORDER — COLCHICINE 0.6 MG PO TABS
0.6000 mg | ORAL_TABLET | Freq: Every day | ORAL | Status: AC
  Administered 2016-07-05 – 2016-07-07 (×3): 0.6 mg via ORAL
  Filled 2016-07-05 (×3): qty 1

## 2016-07-05 MED ORDER — CAPSAICIN 0.025 % EX CREA
TOPICAL_CREAM | Freq: Two times a day (BID) | CUTANEOUS | Status: DC
  Administered 2016-07-05 – 2016-07-09 (×9): via TOPICAL
  Filled 2016-07-05: qty 56.6

## 2016-07-05 MED ORDER — FUROSEMIDE 10 MG/ML IJ SOLN
40.0000 mg | Freq: Once | INTRAMUSCULAR | Status: AC
  Administered 2016-07-05: 40 mg via INTRAVENOUS
  Filled 2016-07-05: qty 4

## 2016-07-05 MED ORDER — KETOROLAC TROMETHAMINE 15 MG/ML IJ SOLN: 7.5000 mg | Freq: Four times a day (QID) | INTRAMUSCULAR | Status: AC | PRN

## 2016-07-05 NOTE — Consult Note (Signed)
   [facility] CM Inpatient Consult   07/05/2016  [name] 03-13-1929 [id number]  Came by to speak with the patient and his wife.  Wife states, "he's on the bedside commode and needs help to get back to bed after he is cleaned up."  Notified staff of needs.  Will follow up again at a more appropriate time.  Please contact:  Natividad Brood, RN BSN [facility] Liaison  [phone] business mobile phone Toll free office [phone]

## 2016-07-05 NOTE — Progress Notes (Signed)
[facility] Progress Note  Subjective: The patient denies abdominal pain. He denies nausea or vomiting. His only complaint at this time is that his right foot and ankle or sore and his family states he can't bear weight on it.  Lipase normal  MRCP shows gallstones but no evidence of choledocholithiasis at this time. The MRI also shows evidence of cirrhosis of the liver. It also shows mild pancreatitis with pseudocysts most likely. The patient and his family denies that he drinks alcohol.  His prothrombin time and INR are still elevated. Coumadin has been held but the prothrombin time is still elevated.  Objective: Vital signs in last 24 hours: Temp:  [98.3 F (36.8 C)-99.1 F (37.3 C)] 99.1 F (37.3 C) (12/01 0400) Pulse Rate:  [61-83] 83 (12/01 0847) Resp:  [14-22] 22 (12/01 0400) BP: (130-151)/(72-80) 151/77 (12/01 0847) SpO2:  [95 %-99 %] 97 % (12/01 0400) Weight:  [88.2 kg (194 lb 8 oz)] 88.2 kg (194 lb 8 oz) (12/01 0500) Weight change: -0.976 kg (-2 lb 2.4 oz)   PE:  No distress  Abdomen soft and nontender  His right foot and ankle do seem swollen.  Lab Results: Results for orders placed or performed during the hospital encounter of 07/03/16 (from the past 24 hour(s))  Glucose, capillary     Status: Abnormal   Collection Time: 07/04/16  4:38 PM  Result Value Ref Range   Glucose-Capillary 117 (H) 65 - 99 mg/dL  Glucose, capillary     Status: Abnormal   Collection Time: 07/04/16  9:22 PM  Result Value Ref Range   Glucose-Capillary 113 (H) 65 - 99 mg/dL  Comprehensive metabolic panel     Status: Abnormal   Collection Time: 07/05/16  3:03 AM  Result Value Ref Range   Sodium 137 135 - 145 mmol/L   Potassium 3.8 3.5 - 5.1 mmol/L   Chloride 105 101 - 111 mmol/L   CO2 24 22 - 32 mmol/L   Glucose, Bld 115 (H) 65 - 99 mg/dL   BUN 10 6 - 20 mg/dL   Creatinine, Ser 0.93 0.61 - 1.24 mg/dL   Calcium 8.9 8.9 - 10.3 mg/dL   Total Protein 5.7 (L) 6.5 - 8.1 g/dL    Albumin 2.4 (L) 3.5 - 5.0 g/dL   AST 76 (H) 15 - 41 U/L   ALT 75 (H) 17 - 63 U/L   Alkaline Phosphatase 278 (H) 38 - 126 U/L   Total Bilirubin 2.6 (H) 0.3 - 1.2 mg/dL   GFR calc non Af Amer >60 >60 mL/min   GFR calc Af Amer >60 >60 mL/min   Anion gap 8 5 - 15  Magnesium     Status: None   Collection Time: 07/05/16  3:03 AM  Result Value Ref Range   Magnesium 1.9 1.7 - 2.4 mg/dL  Lipase, blood     Status: None   Collection Time: 07/05/16  3:03 AM  Result Value Ref Range   Lipase 29 11 - 51 U/L  Glucose, capillary     Status: Abnormal   Collection Time: 07/05/16  7:47 AM  Result Value Ref Range   Glucose-Capillary 118 (H) 65 - 99 mg/dL  Protime-INR     Status: Abnormal   Collection Time: 07/05/16  8:28 AM  Result Value Ref Range   Prothrombin Time 23.5 (H) 11.4 - 15.2 seconds   INR 2.06   Glucose, capillary     Status: Abnormal   Collection Time: 07/05/16 11:16 AM  Result  Value Ref Range   Glucose-Capillary 158 (H) 65 - 99 mg/dL    Studies/Results: Mr 3d Recon At Scanner  Result Date: 07/05/2016 CLINICAL DATA:  Acute onset of generalized abdominal pain and nausea. EXAM: MRI ABDOMEN WITHOUT AND WITH CONTRAST (INCLUDING MRCP) TECHNIQUE: Multiplanar multisequence MR imaging of the abdomen was performed both before and after the administration of intravenous contrast. Heavily T2-weighted images of the biliary and pancreatic ducts were obtained, and three-dimensional MRCP images were rendered by post processing. CONTRAST:  68mL MULTIHANCE GADOBENATE DIMEGLUMINE 529 MG/ML IV SOLN COMPARISON:  None. FINDINGS: Lower chest: There is cyst small to moderate right pleural effusion and a small left effusion. The heart size appears enlarged. Hepatobiliary: The liver appears cirrhotic. The contour the liver is diffusely irregular. There are no arterial phase enhancing structures within the liver to suggest [place]. Small stones are noted layering within the dependent portion of the gallbladder, image 31  of series 5. Cystic dilatation of the mid common bile duct measures up to 1.7 cm, image 17 of series 5. Compatible with type 1 choledochal cyst. The remainder of the common bile duct has a normal caliber. No choledocholithiasis identified. Pancreas: Mild diffuse pancreatic edema. A small amount of fluid is identified surrounding the tail of pancreas where there are several cystic structures measuring up to 1 cm, image 24 of series 5. A few smaller T2 hyperintense structures are identified within the head and body of the pancreas. No enhancing mass. No pancreatic duct dilatation identified. Spleen:  The spleen is normal. Adrenals/Urinary Tract: The adrenal glands are normal. Bilateral renal cysts are noted. There is a hemorrhagic cyst arising from the posterior cortex of the left kidney which measures 1.9 cm, image 34 of series 5. No mass or hydronephrosis. Stomach/Bowel: Visualized portions within the abdomen are unremarkable. Vascular/Lymphatic: No pathologically enlarged lymph nodes identified. No abdominal aortic aneurysm demonstrated. Other:  None. Musculoskeletal: No suspicious bone lesions identified. IMPRESSION: 1. Morphologic features a liver compatible with cirrhosis 2. Type 1 choledochal cyst. 3. Gallstones.  No choledocholithiasis identified at this time. 4. Mild changes of pancreatitis. Multiple cystic structures are associated with the pancreas and in a patient with a history of pancreatitis these are favored to represent multiple pseudo cysts. These measure up to 1 cm. Followup imaging at 6 months with repeat MR of the pancreas is recommended to ensure stability and/or resolution of these abnormalities. 5. Bilateral Bosniak category 1 and 2 kidney cysts. 6. Cardiac enlargement and bilateral pleural effusions, right greater than left. Electronically Signed   By: Kerby Moors M.D.   On: 07/05/2016 10:32   Mr Abdomen Mrcp Moise Boring Contast  Result Date: 07/05/2016 CLINICAL DATA:  Acute onset of  generalized abdominal pain and nausea. EXAM: MRI ABDOMEN WITHOUT AND WITH CONTRAST (INCLUDING MRCP) TECHNIQUE: Multiplanar multisequence MR imaging of the abdomen was performed both before and after the administration of intravenous contrast. Heavily T2-weighted images of the biliary and pancreatic ducts were obtained, and three-dimensional MRCP images were rendered by post processing. CONTRAST:  38mL MULTIHANCE GADOBENATE DIMEGLUMINE 529 MG/ML IV SOLN COMPARISON:  None. FINDINGS: Lower chest: There is cyst small to moderate right pleural effusion and a small left effusion. The heart size appears enlarged. Hepatobiliary: The liver appears cirrhotic. The contour the liver is diffusely irregular. There are no arterial phase enhancing structures within the liver to suggest [place]. Small stones are noted layering within the dependent portion of the gallbladder, image 31 of series 5. Cystic dilatation of the mid common  bile duct measures up to 1.7 cm, image 17 of series 5. Compatible with type 1 choledochal cyst. The remainder of the common bile duct has a normal caliber. No choledocholithiasis identified. Pancreas: Mild diffuse pancreatic edema. A small amount of fluid is identified surrounding the tail of pancreas where there are several cystic structures measuring up to 1 cm, image 24 of series 5. A few smaller T2 hyperintense structures are identified within the head and body of the pancreas. No enhancing mass. No pancreatic duct dilatation identified. Spleen:  The spleen is normal. Adrenals/Urinary Tract: The adrenal glands are normal. Bilateral renal cysts are noted. There is a hemorrhagic cyst arising from the posterior cortex of the left kidney which measures 1.9 cm, image 34 of series 5. No mass or hydronephrosis. Stomach/Bowel: Visualized portions within the abdomen are unremarkable. Vascular/Lymphatic: No pathologically enlarged lymph nodes identified. No abdominal aortic aneurysm demonstrated. Other:  None.  Musculoskeletal: No suspicious bone lesions identified. IMPRESSION: 1. Morphologic features a liver compatible with cirrhosis 2. Type 1 choledochal cyst. 3. Gallstones.  No choledocholithiasis identified at this time. 4. Mild changes of pancreatitis. Multiple cystic structures are associated with the pancreas and in a patient with a history of pancreatitis these are favored to represent multiple pseudo cysts. These measure up to 1 cm. Followup imaging at 6 months with repeat MR of the pancreas is recommended to ensure stability and/or resolution of these abnormalities. 5. Bilateral Bosniak category 1 and 2 kidney cysts. 6. Cardiac enlargement and bilateral pleural effusions, right greater than left. Electronically Signed   By: Kerby Moors M.D.   On: 07/05/2016 10:32      Assessment: #1. Acute pancreatitis.  #2. Gallstones  #3. No evidence of choledocholithiasis on MRCP  #4. Cirrhosis of the liver based on MRI findings of uncertain etiology. Patient denies ever drinking alcohol and family agrees with this also. No history also of hepatitis to his knowledge.  #5. Elevated prothrombin time. This is presumed to be from Coumadin, however now with the diagnosis of cirrhosis we have to wonder whether there is significant hepatocellular disease causing this.  #6. Pain in right foot and ankle  Plan:   Continue supportive care. He needs to remain off of Coumadin. We need to follow the prothrombin time and INR. If it remains elevated then its persistent elevation is most likely due to significant liver disease. I will obtain a hepatitis profile. I have asked the hospitalist to evaluate the right foot and ankle swelling.    Cassell Clement 07/05/2016, 1:29 PM  Pager: [phone] If no answer or after 5 PM call [phone]

## 2016-07-05 NOTE — Progress Notes (Signed)
Attempted a call to pt's wife-Betty- to inform her that pt is being transferred to [facility] but received the answering machine so a message was left.

## 2016-07-05 NOTE — Progress Notes (Signed)
Pt was given explanation for transfer to a different unit and is in agreement. Report called to rebecca RN on [facility].  All belongings transferred with pt via bed to 5w20 on tele.

## 2016-07-05 NOTE — Progress Notes (Signed)
PT Cancellation Note  Patient Details Name: RANDEE COODY MRN: [id number] DOB: April 20, 1929   Cancelled Treatment:    Reason Eval/Treat Not Completed: Pain limiting ability to participate.  Pt indicates too painful in bil feet with R worse than L to even attempt PT at this time.  RN aware.  Will try another time.     Thornton Papas Mikia Delaluz 07/05/2016, 10:27 AM

## 2016-07-05 NOTE — Progress Notes (Signed)
PROGRESS NOTE  [name] [id number] DOB: 08-Aug-1928 DOA: 07/03/2016 PCP: [name], MD  HPI/Recap of past 24 hours:  C/o foot hurt, report h/o gout Denies abdominal pain, no n/v, lft remain elevated, lipase has normalized  Assessment/Plan: Principal Problem:   Gallstone pancreatitis Active Problems:   Dyslipidemia   Diabetes mellitus, type II (HCC)   Pulmonary HTN   PAF (paroxysmal atrial fibrillation) (HCC)   CAD (coronary artery disease)   Chronic diastolic CHF (congestive heart failure) ([place])   Gallstone pancreatitis:  Lipase 1675 on admission. US-abd showed cholelithiasis without signs of cholecystitis. No dilation of common bile duct. No fever or leukocytosis, less likely to have cholecystitis or ascending cholangitis.  -IV fluid:  (patient has dCHF, limiting aggressive IV fluid treatment). -coumadin held since admission,  -lft trending down but remain elevated, lipase normalized on 11/30 -[facility] consulted who recommended cardiology consult for procedure clearance -MRCP ordered by GI -will follow Gi recommendations   Cirrhosis: identified by MRCP from chronic chf?  Hepatitis panel ordered,   Paroxysmal Atrial Fibrillation: CHA2DS2-VASc Score is 4,  Patient is on Coumadin at home. INR is 2.59 on admission. Sinus rhythm since admission, Heart rate is well controlled. -continue metoprolol -Hold Coumadin in case patient needed procedure -cardiology consulted   CAD: with h/o NSTEMI, s/p stent in 2016 - no CP. -Continue aspirin, metoprolol, statin  Chronic diastolic CHF (congestive heart failure), h/o MV repair in 10/2015 2-D echo 10/18/15 showed EF of 55-60 percent. Patient is on high-dose Lasix 80 mg twice a day currently. Patient has trace amount of leg edema, no JVD. No shortness of breath. CHF is compensated on admission. -Continue metoprolol and aspirin -Hold Lasix since patient needed IV fluid for pancreatitis -BNP 617 on admission -+  pleural effusion identified on mrcp, patient is on room air, will d/c ivf, iv lasix 40mg  x1 on 12/1, repeat bnp in am - cardiology consulted  HLD: Last LDL was 80 on 06/05/15 -Continue home medications: Pravastatin  Diet controlled DM-II: Last A1c 5.75/5/17, well controled. Patient is not taking medication at home -SSI  H/o chronic subdural hematoma, stable, no headache   Bilateral Foot pain, h/o gout On exam foot tender to touch, but no significant erythema, wife report he takes colchicine, colchicine ordered, add prn toradol Foot x ray Will check uric acid  DVT ppx: SCD Code Status: Full code Family Communication: None at bed side.   Disposition Plan:  pending Consults called:   [facility]   Procedures:  MRCP  Antibiotics:  none   Objective: BP 139/73 (BP Location: Right Arm)   Pulse 67   Temp 99.1 F (37.3 C) (Oral)   Resp (!) 22   Ht 5\' 10"  (1.778 m)   Wt 88.2 kg (194 lb 8 oz)   SpO2 97%   BMI 27.91 kg/m   Intake/Output Summary (Last 24 hours) at 07/05/16 0808 Last data filed at 07/05/16 0600  Gross per 24 hour  Intake             1870 ml  Output              550 ml  Net             1320 ml   Filed Weights   07/03/16 1735 07/04/16 0516 07/05/16 0500  Weight: 89.2 kg (196 lb 10.4 oz) 88.9 kg (195 lb 15.8 oz) 88.2 kg (194 lb 8 oz)    Exam:   General:  NAD, poor  historian  Cardiovascular: RRR, + mumur  Respiratory: CTABL  Abdomen: Soft/ND/NT, positive BS  Musculoskeletal: No Edema  Neuro: aaox3, slow in answering questions though  Data Reviewed: Basic Metabolic Panel:  Recent Labs Lab 07/02/16 1420 07/03/16 0050 07/03/16 0405 07/04/16 0248 07/05/16 0303  NA 138 139 138 139 137  K 3.5 3.6 3.3* 3.2* 3.8  CL 102 101 102 106 105  CO2 26 28 24 26 24   GLUCOSE 116* 131* 129* 116* 115*  BUN 18 17 18 12 10   CREATININE 1.01 1.20 1.11 1.01 0.93  CALCIUM 8.5* 9.0 8.9 8.8* 8.9  MG  --   --   --  2.1 1.9   Liver Function  Tests:  Recent Labs Lab 07/02/16 1420 07/03/16 0050 07/03/16 0405 07/04/16 0248 07/05/16 0303  AST 97* 134* 152* 126* 76*  ALT 93* 104* 103* 102* 75*  ALKPHOS 283* 299* 310* 314* 278*  BILITOT 1.3* 2.9* 3.2* 2.9* 2.6*  PROT 6.5 7.1 6.8 6.0* 5.7*  ALBUMIN 3.4* 3.1* 2.9* 2.6* 2.4*    Recent Labs Lab 07/02/16 1420 07/03/16 0050 07/04/16 0248 07/05/16 0303  LIPASE 311* 1,675* 39 29   No results for input(s): AMMONIA in the last 168 hours. CBC:  Recent Labs Lab 07/02/16 1420 07/03/16 0050 07/03/16 0405 07/04/16 0248  WBC 6.9 7.8 7.2 6.3  NEUTROABS 4,002  --   --   --   HGB 12.8* 12.8* 12.2* 11.9*  HCT 39.3 38.2* 36.9* 36.4*  MCV 92.5 91.8 91.8 92.2  PLT 175 158 149* 149*   Cardiac Enzymes:   No results for input(s): CKTOTAL, CKMB, CKMBINDEX, TROPONINI in the last 168 hours. BNP (last 3 results)  Recent Labs  12/08/15 1709 12/08/15 2209 07/03/16 0355  BNP 1,374.7* 1,647.0* 617.4*    ProBNP (last 3 results) No results for input(s): PROBNP in the last 8760 hours.  CBG:  Recent Labs Lab 07/04/16 0803 07/04/16 1133 07/04/16 1638 07/04/16 2122 07/05/16 0747  GLUCAP 113* 108* 117* 113* 118*    No results found for this or any previous visit (from the past 240 hour(s)).   Studies: No results found.  Scheduled Meds: . aspirin EC  81 mg Oral QODAY  . finasteride  5 mg Oral Daily  . Influenza vac split quadrivalent PF  0.5 mL Intramuscular Tomorrow-1000  . insulin aspart  0-5 Units Subcutaneous QHS  . insulin aspart  0-9 Units Subcutaneous TID WC  . metoprolol tartrate  12.5 mg Oral BID  . pravastatin  40 mg Oral q1800  . sodium chloride flush  3 mL Intravenous Q12H    Continuous Infusions: . dextrose 5 % and 0.9 % NaCl with KCl 20 mEq/L 50 mL/hr at 07/04/16 2120     Time spent: 20mins  Sanii Kukla MD, PhD  [facility] Pager [phone]. If 7PM-7AM, please contact night-coverage at [email or web address], password [facility] 07/05/2016, 8:08 AM  LOS: 2  days

## 2016-07-05 NOTE — Progress Notes (Signed)
Patient states that he cannot find his cell phone. RN looked through basin that was brought up with patient's daily care items and creams. Cell phone not in basin. RN called back down to [place], RN on [facility] [address] and she stated that some of patient's belongings had been left in his closet and that someone would bring them up. Patient informed of this

## 2016-07-06 LAB — GLUCOSE, CAPILLARY
GLUCOSE-CAPILLARY: 119 mg/dL — AB (ref 65–99)
GLUCOSE-CAPILLARY: 173 mg/dL — AB (ref 65–99)
Glucose-Capillary: 136 mg/dL — ABNORMAL HIGH (ref 65–99)
Glucose-Capillary: 246 mg/dL — ABNORMAL HIGH (ref 65–99)
Glucose-Capillary: 188 mg/dL — ABNORMAL HIGH (ref 65–99)

## 2016-07-06 LAB — COMPREHENSIVE METABOLIC PANEL
ALBUMIN: 2.3 g/dL — AB (ref 3.5–5.0)
ALK PHOS: 269 U/L — AB (ref 38–126)
ALT: 55 U/L (ref 17–63)
AST: 50 U/L — ABNORMAL HIGH (ref 15–41)
Anion gap: 6 (ref 5–15)
BUN: 9 mg/dL (ref 6–20)
CALCIUM: 8.7 mg/dL — AB (ref 8.9–10.3)
CHLORIDE: 107 mmol/L (ref 101–111)
CO2: 25 mmol/L (ref 22–32)
CREATININE: 0.92 mg/dL (ref 0.61–1.24)
GFR calc Af Amer: >60 mL/min (ref >60)
GFR calc non Af Amer: >60 mL/min (ref >60)
GLUCOSE: 116 mg/dL — AB (ref 65–99)
Potassium: 3.4 mmol/L — ABNORMAL LOW (ref 3.5–5.1)
SODIUM: 138 mmol/L (ref 135–145)
Total Bilirubin: 2.4 mg/dL — ABNORMAL HIGH (ref 0.3–1.2)
Total Protein: 5.9 g/dL — ABNORMAL LOW (ref 6.5–8.1)

## 2016-07-06 LAB — PROTIME-INR
INR: 2.04
Prothrombin Time: 23.3 seconds — ABNORMAL HIGH (ref 11.4–15.2)

## 2016-07-06 LAB — AMMONIA: AMMONIA: 36 umol/L — AB (ref 9–35)

## 2016-07-06 LAB — BRAIN NATRIURETIC PEPTIDE: B Natriuretic Peptide: 1127.6 pg/mL — ABNORMAL HIGH (ref 0.0–100.0)

## 2016-07-06 LAB — URIC ACID: Uric Acid, Serum: 7.7 mg/dL — ABNORMAL HIGH (ref 4.4–7.6)

## 2016-07-06 MED ORDER — METHYLPREDNISOLONE SODIUM SUCC 125 MG IJ SOLR
60.0000 mg | Freq: Once | INTRAMUSCULAR | Status: AC
  Administered 2016-07-06: 60 mg via INTRAVENOUS
  Filled 2016-07-06: qty 2

## 2016-07-06 MED ORDER — POTASSIUM CHLORIDE CRYS ER 20 MEQ PO TBCR
40.0000 meq | EXTENDED_RELEASE_TABLET | Freq: Once | ORAL | Status: AC
  Administered 2016-07-06: 40 meq via ORAL
  Filled 2016-07-06: qty 2

## 2016-07-06 MED ORDER — FUROSEMIDE 10 MG/ML IJ SOLN
40.0000 mg | Freq: Once | INTRAMUSCULAR | Status: AC
  Administered 2016-07-06: 40 mg via INTRAVENOUS
  Filled 2016-07-06: qty 4

## 2016-07-06 MED ORDER — PREDNISONE 50 MG PO TABS
50.0000 mg | ORAL_TABLET | Freq: Every day | ORAL | Status: DC
  Administered 2016-07-07 – 2016-07-09 (×3): 50 mg via ORAL
  Filled 2016-07-06 (×3): qty 1

## 2016-07-06 MED ORDER — ALLOPURINOL 100 MG PO TABS
100.0000 mg | ORAL_TABLET | Freq: Every day | ORAL | Status: DC
  Administered 2016-07-06 – 2016-07-09 (×4): 100 mg via ORAL
  Filled 2016-07-06 (×4): qty 1

## 2016-07-06 MED ORDER — FUROSEMIDE 10 MG/ML IJ SOLN
40.0000 mg | Freq: Two times a day (BID) | INTRAMUSCULAR | Status: AC
  Administered 2016-07-06 – 2016-07-08 (×4): 40 mg via INTRAVENOUS
  Filled 2016-07-06 (×4): qty 4

## 2016-07-06 NOTE — Consult Note (Signed)
Surgical Consultation Requesting provider: Dr. Dillard Cannon  CC: gallstone pancreatitis  HPI: This is an 82yo gentleman who was admitted 11/29 with gallstone pancreatitis. He is currently without pain, afebrile and tolerating a clear liquid diet. General surgery is consulted for possible cholecystectomy.   Lipase peaked at 1675 and is now normal. No leukocytosis.  Max tbili 3.2, now 2.4 with MRCP neg for CBD stones. 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 Possibly secondary to chronic CHF? Hepatitis panel pending. AST and ALT mildly elevated but downtrending as is alk phos. INR 2.04 today (coumadin held since admission).   He has a history of diet- controlled diabetes, paroxysmal a-fib, CAD/ diastolic CHF on daily high dose lasix, and had a minimally invasive mitral valve repair in March.  Cardiology has evaluated the patient this admission in anticipation of ERCP and does not recommend any further cardiac workup.    No Known Allergies  Past Medical History:  Diagnosis Date  . Chronic diastolic (congestive) heart failure    a. 10/2013 Echo: EF 55-60%, basal-mid anteroseptal and basal-mid inferoseptal HK.  . Chronic fatigue   . Chronic subdural hematoma ([place])    a. 12/2015 Head CT: chronic right holo-hemispheric SDH w/o midline shift.  . Coronary artery disease    a. 11/2014 NSTEMI: DESx 2 placed to LAD and RCA; b. 08/2015 Cath: LAD patent stent, LCX 70m RCA patent stent, 238m . Diabetes mellitus, type II ([place]   a. HgA1c 6.8 in 03/2015  . ED (erectile dysfunction)   . Gout   . Hemangioma of liver 08/02/2015   12 mm enhancing lesion seen on CT - suspicious for benign hemangioma but not diagnostic - MRI recommended  . HLD (hyperlipidemia)   . Hypertensive heart disease   . Kidney stones   . Lower extremity edema    a. chronic  . OA (osteoarthritis)   . Orthostatic hypotension   . PAF (paroxysmal atrial  fibrillation) ([place]   a. 10/2015 s/p DCCV;  b. CHA2DS2VASc = 6--> on coumadin; c. 10/2015 Echo: sev dil LA.  . Marland Kitchenhysical deconditioning   . Prostate cancer ([place]  . Pulmonary HTN   . Pulmonary hypertension    a. 10/2015 Echo: PASP 4760m.  . SMarland Kitchenvere mitral regurgitation    a.  10/2015 s/p minimally invasive MV repair; b. 10/2015 Echo: mod MS, mean grad 6mm49m  . SpMarland Kitchennal stenosis   . SVT (supraventricular tachycardia) (HCC)    a. recurrent, usually responsive to vagal manuevers  . Tricuspid regurgitation    a. 10/2015 Echo: mild to mod TR.    Past Surgical History:  Procedure Laterality Date  . BACK SURGERY    . Back surgery x 2    . CARDIAC CATHETERIZATION N/A 08/08/2015   Procedure: Right/Left Heart Cath and Coronary Angiography;  Surgeon: MichSherren Mocha;  [facility];  Service: Cardiovascular;  Laterality: N/A;  . CARDIAC SURGERY     2 cardiac stents  . CARDIOVERSION N/A 10/20/2015   Procedure: CARDIOVERSION;  Surgeon: KennPixie Casino;  Location[facility]ice: Cardiovascular;  Laterality: N/A;  . CERVICAL SPINE SURGERY    . EYE SURGERY    . INTRAOPERATIVE TRANSESOPHAGEAL ECHOCARDIOGRAM  10/05/2015   Procedure: INTRAOPERATIVE TRANSESOPHAGEAL ECHOCARDIOGRAM;  Surgeon: ClarRexene Alberts;  [facility]  Service: Open Heart Surgery;;  . JOINT REPLACEMENT     shoulder  . LEFT HEART  CATHETERIZATION WITH CORONARY ANGIOGRAM N/A 11/16/2014   Procedure: LEFT HEART CATHETERIZATION WITH CORONARY ANGIOGRAM;  Surgeon: Troy Sine, MD;  Location: [facility] CATH LAB;  Service: Cardiovascular;  Laterality: N/A;  . MITRAL VALVE REPAIR Right 10/04/2015   Procedure: MINIMALLY INVASIVE MITRAL VALVE REPAIR (MVR);  Surgeon: Rexene Alberts, MD;  Location: [place];  Service: Open Heart Surgery;  Laterality: Right;  . Rt rotator cuff repair    . TEE WITHOUT CARDIOVERSION N/A 06/22/2015   Procedure: TRANSESOPHAGEAL ECHOCARDIOGRAM (TEE);  Surgeon: Skeet Latch, MD;  Location: [place];  Service: Cardiovascular;  Laterality: N/A;  . TEE WITHOUT CARDIOVERSION N/A 10/04/2015   Procedure: TRANSESOPHAGEAL ECHOCARDIOGRAM (TEE);  Surgeon: Rexene Alberts, MD;  Location: [place];  Service: Open Heart Surgery;  Laterality: N/A;  . WOUND EXPLORATION N/A 10/05/2015   Procedure: Sternal Incision;  Surgeon: Rexene Alberts, MD;  Location: [place];  Service: Open Heart Surgery;  Laterality: N/A;    Family History  Problem Relation Age of Onset  . Heart failure Mother 72  . Heart attack Mother   . Heart attack Brother     Social History   Social History  . Marital status: Married    Spouse name: N/A  . Number of children: 4  . Years of education: N/A   Occupational History  .  Retired   Social History Main Topics  . Smoking status: Former Research scientist (life sciences)  . Smokeless tobacco: Never Used     Comment: QUIT SMOKING  MANY YEARS AGO"  . Alcohol use No  . Drug use: No  . Sexual activity: Not Asked   Other Topics Concern  . None   Social History Narrative   ** Merged History Encounter **       Four adopted children.  Lives alone.     No current facility-administered medications on file prior to encounter.    Current Outpatient Prescriptions on File Prior to Encounter  Medication Sig Dispense Refill  . aspirin EC 81 MG tablet Take 81 mg by mouth every other day. Reported on 12/29/2015    . finasteride (PROSCAR) 5 MG tablet TAKE ONE (1) TABLET EACH DAY 30 tablet 11  . furosemide (LASIX) 40 MG tablet Take 2 tablets (80 mg total) by mouth 2 (two) times daily. 60 tablet 3  . metoprolol tartrate (LOPRESSOR) 25 MG tablet Take 0.5 tablets (12.5 mg total) by mouth 2 (two) times daily. 60 tablet 3  . pravastatin (PRAVACHOL) 40 MG tablet Take 1 tablet (40 mg total) by mouth daily at 6 PM. 30 tablet 11  . warfarin (COUMADIN) 2 MG tablet Take 1-2 tablets daily or as directed by coumadin clinic (Patient taking differently: Take 4-5 mg by mouth See admin instructions. 5 mg in the evening on  Sun/Wed/Fri and 4 mg on Mon/Tues/Thurs/Sat) 60 tablet 1  . ondansetron (ZOFRAN) 4 MG tablet Take 1 tablet (4 mg total) by mouth every 8 (eight) hours as needed for nausea or vomiting. (Patient not taking: Reported on 07/03/2016) 20 tablet 0  . potassium chloride SA (K-DUR,KLOR-CON) 20 MEQ tablet Take 1 tablet (20 mEq total) by mouth daily. (Patient not taking: Reported on 07/03/2016) 30 tablet 1  . traMADol (ULTRAM) 50 MG tablet Take 1 tablet (50 mg total) by mouth every 6 (six) hours as needed for moderate pain. (Patient not taking: Reported on 07/03/2016) 30 tablet 0    Review of Systems: a complete, 10pt review of systems was completed with pertinent positives and negatives as documented  in the HPI.   Physical Exam: Vitals:   07/06/16 1018 07/06/16 1215  BP: 123/61 131/76  Pulse: 71 (!) 57  Resp:  16  Temp:  98 F (36.7 C)   Gen: A&Ox3, no distress  Head: normocephalic, atraumatic, EOMI, anicteric.  Neck: supple without mass or thyromegaly Chest: unlabored respirations clear bilaterally  Cardiovascular: RRR with palpable distal pulses Abdomen: soft, nontender, nondistended, no surgical scars. No mass or organomegaly. Extremities: warm, mild bilateral lower extremity edema, bilateral active gout tophi R> left Neuro: grossly intact Psych: appropriate mood and affect  Skin: Areas of hyperpigmentation along abdomen  CBC Latest Ref Rng & Units 07/04/2016 07/03/2016 07/03/2016  WBC 4.0 - 10.5 K/uL 6.3 7.2 7.8  Hemoglobin 13.0 - 17.0 g/dL 11.9(L) 12.2(L) 12.8(L)  Hematocrit 39.0 - 52.0 % 36.4(L) 36.9(L) 38.2(L)  Platelets 150 - 400 K/uL 149(L) 149(L) 158    CMP Latest Ref Rng & Units 07/06/2016 07/05/2016 07/04/2016  Glucose 65 - 99 mg/dL 116(H) 115(H) 116(H)  BUN 6 - 20 mg/dL '9 10 12  ' Creatinine 0.61 - 1.24 mg/dL 0.92 0.93 1.01  Sodium 135 - 145 mmol/L 138 137 139  Potassium 3.5 - 5.1 mmol/L 3.4(L) 3.8 3.2(L)  Chloride 101 - 111 mmol/L 107 105 106  CO2 22 - 32 mmol/L '25 24 26   ' Calcium 8.9 - 10.3 mg/dL 8.7(L) 8.9 8.8(L)  Total Protein 6.5 - 8.1 g/dL 5.9(L) 5.7(L) 6.0(L)  Total Bilirubin 0.3 - 1.2 mg/dL 2.4(H) 2.6(H) 2.9(H)  Alkaline Phos 38 - 126 U/L 269(H) 278(H) 314(H)  AST 15 - 41 U/L 50(H) 76(H) 126(H)  ALT 17 - 63 U/L 55 75(H) 102(H)    Lab Results  Component Value Date   INR 2.04 07/06/2016   INR 2.06 07/05/2016   INR 2.32 07/04/2016    Imaging: MRCP: 1. Morphologic features a liver compatible with cirrhosis 2. Type 1 choledochal cyst. 3. Gallstones.  No choledocholithiasis identified at this time. 4. Mild changes of pancreatitis. Multiple cystic structures are associated with the pancreas and in a patient with a history of pancreatitis these are favored to represent multiple pseudo cysts. These measure up to 1 cm. Followup imaging at 6 months with repeat MR of the pancreas is recommended to ensure stability and/or resolution of these abnormalities. 5. Bilateral Bosniak category 1 and 2 kidney cysts. 6. Cardiac enlargement and bilateral pleural effusions, right greater than left.   RUQ [place]:  1. No acute abnormality seen at the right upper quadrant. 2. Cholelithiasis. Gallbladder wall thickening raises question for chronic inflammation. No evidence for obstruction or acute cholecystitis. 3. Findings suggest hepatic cirrhosis. Mild fatty infiltration within the liver.  A/P: Gallstone pancreatitis with several other interesting findings on MRI. Would benefit from cholecystectomy to prevent further episodes of pancreatitis. Will need INR normalized prior to surgery, which will likely be early in the week. This will also afford time to see the results of his hepatitis panel. The choledochal cyst has been asymptomatic up to now and he would not benefit from the cancer-risk-reducing endeavor of resection, nor would he tolerate it well. He will need 6 month interim repeat MRI for the pancreatic cysts.   Will continue to follow, thank you for  involving [place] in this nice man's care.     Romana Juniper, MD [facility] Surgery, [place] Pager [phone]

## 2016-07-06 NOTE — Evaluation (Signed)
Clinical/Bedside Swallow Evaluation Patient Details  Name: [name] MRN: [id number] Date of Birth: [birth year]  Today'Fred Date: 07/06/2016 Time: SLP Start Time (ACUTE ONLY): 34 SLP Stop Time (ACUTE ONLY): 1314 SLP Time Calculation (min) (ACUTE ONLY): 16 min  Past Medical History:  Past Medical History:  Diagnosis Date  . Chronic diastolic (congestive) heart failure    a. 10/2013 Echo: EF 55-60%, basal-mid anteroseptal and basal-mid inferoseptal HK.  . Chronic fatigue   . Chronic subdural hematoma ([place])    a. 12/2015 Head CT: chronic right holo-hemispheric SDH w/o midline shift.  . Coronary artery disease    a. 11/2014 NSTEMI: DESx 2 placed to LAD and RCA; b. 08/2015 Cath: LAD patent stent, LCX 50m, RCA patent stent, 27m.  . Diabetes mellitus, type II ([place])    a. HgA1c 6.8 in 03/2015  . ED (erectile dysfunction)   . Gout   . Hemangioma of liver 08/02/2015   12 mm enhancing lesion seen on CT - suspicious for benign hemangioma but not diagnostic - MRI recommended  . HLD (hyperlipidemia)   . Hypertensive heart disease   . Kidney stones   . Lower extremity edema    a. chronic  . OA (osteoarthritis)   . Orthostatic hypotension   . PAF (paroxysmal atrial fibrillation) ([place])    a. 10/2015 Fred/p DCCV;  b. CHA2DS2VASc = 6--> on coumadin; c. 10/2015 Echo: sev dil LA.  Marland Kitchen Physical deconditioning   . Prostate cancer ([place])   . Pulmonary HTN   . Pulmonary hypertension    a. 10/2015 Echo: PASP 64mmHg.  Marland Kitchen Severe mitral regurgitation    a.  10/2015 Fred/p minimally invasive MV repair; b. 10/2015 Echo: mod MS, mean grad 62mmHg.  Marland Kitchen Spinal stenosis   . SVT (supraventricular tachycardia) (HCC)    a. recurrent, usually responsive to vagal manuevers  . Tricuspid regurgitation    a. 10/2015 Echo: mild to mod TR.   Past Surgical History:  Past Surgical History:  Procedure Laterality Date  . BACK SURGERY    . Back surgery x 2    . CARDIAC CATHETERIZATION N/A 08/08/2015   Procedure: Right/Left Heart Cath  and Coronary Angiography;  Surgeon: Sherren Mocha, MD;  Location: [place] CV LAB;  Service: Cardiovascular;  Laterality: N/A;  . CARDIAC SURGERY     2 cardiac stents  . CARDIOVERSION N/A 10/20/2015   Procedure: CARDIOVERSION;  Surgeon: Pixie Casino, MD;  Location: [place];  Service: Cardiovascular;  Laterality: N/A;  . CERVICAL SPINE SURGERY    . EYE SURGERY    . INTRAOPERATIVE TRANSESOPHAGEAL ECHOCARDIOGRAM  10/05/2015   Procedure: INTRAOPERATIVE TRANSESOPHAGEAL ECHOCARDIOGRAM;  Surgeon: Rexene Alberts, MD;  [facility];  Service: Open Heart Surgery;;  . JOINT REPLACEMENT     shoulder  . LEFT HEART CATHETERIZATION WITH CORONARY ANGIOGRAM N/A 11/16/2014   Procedure: LEFT HEART CATHETERIZATION WITH CORONARY ANGIOGRAM;  Surgeon: Troy Sine, MD;  Location[facility] C[facility] LAB;  Service: Cardiovascular;  Laterality: N/A;  . MITRAL VALVE REPAIR Right 10/04/2015   Procedure: MINIMALLY INVASIVE MITRAL VALVE REPAIR (MVR);  Surgeon: Rexene Alberts, MD;  Location: [place];  Service: Open Heart Surgery;  Laterality: Right;  . Rt rotator cuff repair    . TEE WITHOUT CARDIOVERSION N/A 06/22/2015   Procedure: TRANSESOPHAGEAL ECHOCARDIOGRAM (TEE);  Surgeon: Skeet Latch, MD;  Location: [place];  Service: Cardiovascular;  Laterality: N/A;  . TEE WITHOUT CARDIOVERSION N/A 10/04/2015   Procedure: TRANSESOPHAGEAL ECHOCARDIOGRAM (TEE);  Surgeon: Rexene Alberts, MD;  [facility];  Service: Open Heart Surgery;  Laterality: N/A;  . WOUND EXPLORATION N/A 10/05/2015   Procedure: Sternal Incision;  Surgeon: Rexene Alberts, MD;  Location: [place];  Service: Open Heart Surgery;  Laterality: N/A;   HPI:  Fred Talleyis a 82 y.o.malewith medical history significant of gallstone, diet-controlled diabetes, hyperlipidemia, prostate cancer, PAF on Coumadin, CAD, dCHF, tricuspid valve regurgitation, atrial valve regurgitation (Fred/p of repair), pulmonary hypertension, gout, who presents with abdominal pain.    Patient states that he started having abdominal pain since Thanksgiving day. It is located in the right upper quadrant, constant, 6 out of 10 in severity, sharp, nonradiating. It is associated with nausea, but not vomiting or diarrhea. Patient does not have fever or chills. Patient does not have chest pain, shortness of breath, cough, symptoms of UTI or unilateral weakness.  He was ultimately diagnosed with gallstone pancreatitis and is currently on a clear liquid diet.  Of note the patient had MBS dated 10/30/15 with recommendation for a dysphagia 3 diet with nectar thick liquids.  The patient reported that he has not really thickened liquids at home.  He reported no recent respiratory issues.     Assessment / Plan / Recommendation Clinical Impression  Limited clinical swallowing evaluation was completed as the patient is on a clear liquid only diet due to pancreatitis.  Oral mechanism exam was completed and unremarkable.  The [facility] Protocol was administered using 3 ounces of thin liquids and the patient did not demonstrate any overt issues and passed the protocol.  Swallow trigger was timely, however, hyo-laryngeal movement appeared to be decreased to palpation.  Please note that the patient had an MBS dated 10/30/15 that showed silent penetration of thin liquids.  At that time he was recommended to be on a dysphagia 3 diet with nectar thick liquids.  He reported that he really didn't thicken his liquids at home.  There were no reports of respiratory issues per the patient or his wife.   Given this recommend that the patient continue on clear liquids.  ST will follow up as the patient'Fred diet is advanced to assess for therapeutic diet tolerance and need for possible repeat objective measure.      Aspiration Risk  Moderate aspiration risk    Diet Recommendation   Continue with clears  Medication Administration: Crushed with puree    Other  Recommendations Oral Care Recommendations: Oral care BID    Follow up Recommendations  (TBD)      Frequency and Duration min 2x/week  2 weeks       Prognosis Prognosis for Safe Diet Advancement: Fair      Swallow Study   General Date of Onset: 07/03/16 HPI: Fred Talleyis a 82 y.o.malewith medical history significant of gallstone, diet-controlled diabetes, hyperlipidemia, prostate cancer, PAF on Coumadin, CAD, dCHF, tricuspid valve regurgitation, atrial valve regurgitation (Fred/p of repair), pulmonary hypertension, gout, who presents with abdominal pain.   Patient states that he started having abdominal pain since Thanksgiving day. It is located in the right upper quadrant, constant, 6 out of 10 in severity, sharp, nonradiating. It is associated with nausea, but not vomiting or diarrhea. Patient does not have fever or chills. Patient does not have chest pain, shortness of breath, cough, symptoms of UTI or unilateral weakness.  He was ultimately diagnosed with gallstone pancreatitis and is currently on a clear liquid diet.  Of note the patient had MBS dated 10/30/15 with recommendation for a dysphagia 3 diet with nectar  thick liquids.  The patient reported that he has not really thickened liquids at home.  He reported no recent respiratory issues.   Type of Study: Bedside Swallow Evaluation Previous Swallow Assessment: 10/30/15 MBS with rx for dysphagia 3 and nectar thick liquids.   Diet Prior to this Study: Thin liquids (Clear liquids only) Temperature Spikes Noted: No History of Recent Intubation: No Behavior/Cognition: Alert;Cooperative;Pleasant mood Oral Cavity Assessment: Within Functional Limits Oral Care Completed by SLP: No Oral Cavity - Dentition: Adequate natural dentition Vision: Functional for self-feeding Self-Feeding Abilities: Able to feed self Patient Positioning: Upright in bed Baseline Vocal Quality: Normal (dysarthric) Volitional Cough: Strong Volitional Swallow: Able to elicit    Oral/Motor/Sensory Function Overall Oral  Motor/Sensory Function: Within functional limits   Ice Chips Ice chips: Not tested   Thin Liquid Thin Liquid: Within functional limits Presentation: Self Fed;Cup    Nectar Thick Nectar Thick Liquid: Not tested   Honey Thick Honey Thick Liquid: Not tested   Puree Puree: Not tested   Solid   GO   Solid: Not tested       Shelly Flatten, MA, CCC-SLP Acute Rehab SLP [id number] Shelly Flatten N 07/06/2016,1:26 PM

## 2016-07-06 NOTE — Evaluation (Signed)
Physical Therapy Evaluation Patient Details Name: [name] MRN: [id number] DOB: 25-Aug-1928 Today's Date: 07/06/2016   History of Present Illness  pt presents with Pancreatitis.  pt with hx of CAD, MVR, HTN, A-fib, DM, SDH, and HF.    Clinical Impression  Pt agreeable to attempt mobility with PT today, however once sitting EOB unable to clear bottom from bed due to pain in R ankle/foot.  Pt indicates sharp shooting pain any time he attempts to weightbear on R LE.  RN aware.  Will continue efforts at mobilization.  Feel pt will need SNF level of care at D/C.      Follow Up Recommendations SNF    Equipment Recommendations  None recommended by PT    Recommendations for Other Services       Precautions / Restrictions Precautions Precautions: Fall Restrictions Weight Bearing Restrictions: No      Mobility  Bed Mobility Overal bed mobility: Needs Assistance Bed Mobility: Supine to Sit;Sit to Supine     Supine to sit: Mod assist;HOB elevated Sit to supine: Mod assist   General bed mobility comments: A with bringing trunk to sitting and scooting hips to EOB, along with A with Bil LEs returning to bed.    Transfers Overall transfer level: Needs assistance Equipment used: Rolling walker (2 wheeled) Transfers: Sit to/from Stand Sit to Stand: Total assist         General transfer comment: Attempted to come to stand x2 with pt unable to clear hips from bed with PT's A.  cues for UE use, though pt does tend to do things his way.    Ambulation/Gait                Stairs            Wheelchair Mobility    Modified Rankin (Stroke Patients Only)       Balance Overall balance assessment: Needs assistance Sitting-balance support: Single extremity supported;Bilateral upper extremity supported;No upper extremity supported;Feet supported Sitting balance-Leahy Scale: Fair                                       Pertinent Vitals/Pain Pain  Assessment: 0-10 Pain Score: 10-Worst pain ever Pain Location: R foot/ankle with attempts at weightbearing Pain Descriptors / Indicators: Sharp;Shooting Pain Intervention(s): Monitored during session;Premedicated before session;Repositioned    Home Living Family/patient expects to be discharged to:: Skilled nursing facility Living Arrangements: Spouse/significant other                    Prior Function Level of Independence: Independent with assistive device(s)               Hand Dominance        Extremity/Trunk Assessment   Upper Extremity Assessment: Defer to OT evaluation           Lower Extremity Assessment: Generalized weakness;RLE deficits/detail;LLE deficits/detail RLE Deficits / Details: pt with functional ROM, but strength limited by pain in ankle and foot.  Sensation seems to be intact, though limited assessment due to pain.   LLE Deficits / Details: AROM WFL, Sensation intact, generally weak with some c/o pain in ankle and foot.    Cervical / Trunk Assessment: Kyphotic  Communication   Communication: Expressive difficulties (soft spoken and dysarthric)  Cognition Arousal/Alertness: Awake/alert Behavior During Therapy: WFL for tasks assessed/performed Overall Cognitive Status: Within Functional Limits for tasks assessed  General Comments      Exercises     Assessment/Plan    PT Assessment Patient needs continued PT services  PT Problem List Decreased strength;Decreased activity tolerance;Decreased balance;Decreased mobility;Decreased coordination;Decreased knowledge of use of DME;Pain          PT Treatment Interventions DME instruction;Gait training;Stair training;Functional mobility training;Therapeutic activities;Therapeutic exercise;Balance training;Patient/family education    PT Goals (Current goals can be found in the Care Plan section)  Acute Rehab PT Goals Patient Stated Goal: No more pain PT Goal  Formulation: With patient Time For Goal Achievement: 07/20/16 Potential to Achieve Goals: Good    Frequency Min 3X/week   Barriers to discharge        Co-evaluation               End of Session Equipment Utilized During Treatment: Gait belt Activity Tolerance: Patient limited by pain Patient left: in bed;with call bell/phone within reach;with family/visitor present Nurse Communication: Mobility status;Need for lift equipment         Time: [id number] PT Time Calculation (min) (ACUTE ONLY): 34 min   Charges:   PT Evaluation $PT Eval Moderate Complexity: 1 Procedure PT Treatments $Therapeutic Activity: 8-22 mins   PT G CodesCatarina Hartshorn, [place]  [id number] 07/06/2016, 3:18 PM

## 2016-07-06 NOTE — Progress Notes (Signed)
[facility] PROGRESS NOTE Subjective patient presented to the ER with fake right upper quadrant pain and some nausea. Ultrasound showed gallstones and his lipase was 1600. Total bilirubin was elevated. Patient on Coumadin. MRCP showed choledochal cyst but no CBD stones. There was diffuse pancreatic edema with small cystic structures pancreas. The patient is all clear liquids and reports that he is pain-free. His INR still somewhat elevated.  Objective: Vital signs in last 24 hours: Temp:  [98.2 F (36.8 C)-98.7 F (37.1 C)] 98.7 F (37.1 C) (12/02 0611) Pulse Rate:  [65-79] 71 (12/02 1018) Resp:  [18-20] 20 (12/02 0611) BP: (123-143)/(60-75) 123/61 (12/02 1018) SpO2:  [96 %-98 %] 98 % (12/02 0611) Weight:  [90.3 kg (199 lb)-95.5 kg (210 lb 8 oz)] 90.3 kg (199 lb) (12/02 0611) Last BM Date: 07/04/16  Intake/Output from previous day: 12/01 0701 - 12/02 0700 In: [id number] [P.O.:720; I.V.:450] Out: 1050 [Urine:1050] Intake/Output this shift: No intake/output data recorded.  PE: General-- alert white male no distress  Abdomen-- completely soft and nontender.  Lab Results:  Recent Labs  07/04/16 0248  WBC 6.3  HGB 11.9*  HCT 36.4*  PLT 149*   BMET  Recent Labs  07/04/16 0248 07/05/16 0303 07/06/16 0508  NA 139 137 138  K 3.2* 3.8 3.4*  CL 106 105 107  CO2 26 24 25   CREATININE 1.01 0.93 0.92   LFT  Recent Labs  07/04/16 0248 07/05/16 0303 07/06/16 0508  PROT 6.0* 5.7* 5.9*  AST 126* 76* 50*  ALT 102* 75* 55  ALKPHOS 314* 278* 269*  BILITOT 2.9* 2.6* 2.4*   PT/INR  Recent Labs  07/04/16 0248 07/05/16 0828 07/06/16 0508  LABPROT 25.8* 23.5* 23.3*  INR 2.32 2.06 2.04   PANCREAS  Recent Labs  07/04/16 0248 07/05/16 0303  LIPASE 39 29         Studies/Results: Mr 3d Recon At Scanner  Result Date: 07/05/2016 CLINICAL DATA:  Acute onset of generalized abdominal pain and nausea. EXAM: MRI ABDOMEN WITHOUT AND WITH CONTRAST (INCLUDING  MRCP) TECHNIQUE: Multiplanar multisequence MR imaging of the abdomen was performed both before and after the administration of intravenous contrast. Heavily T2-weighted images of the biliary and pancreatic ducts were obtained, and three-dimensional MRCP images were rendered by post processing. CONTRAST:  36mL MULTIHANCE GADOBENATE DIMEGLUMINE 529 MG/ML IV SOLN COMPARISON:  None. FINDINGS: Lower chest: There is cyst small to moderate right pleural effusion and a small left effusion. The heart size appears enlarged. Hepatobiliary: The liver appears cirrhotic. The contour the liver is diffusely irregular. There are no arterial phase enhancing structures within the liver to suggest [place]. Small stones are noted layering within the dependent portion of the gallbladder, image 31 of series 5. Cystic dilatation of the mid common bile duct measures up to 1.7 cm, image 17 of series 5. Compatible with type 1 choledochal cyst. The remainder of the common bile duct has a normal caliber. No choledocholithiasis identified. Pancreas: Mild diffuse pancreatic edema. A small amount of fluid is identified surrounding the tail of pancreas where there are several cystic structures measuring up to 1 cm, image 24 of series 5. A few smaller T2 hyperintense structures are identified within the head and body of the pancreas. No enhancing mass. No pancreatic duct dilatation identified. Spleen:  The spleen is normal. Adrenals/Urinary Tract: The adrenal glands are normal. Bilateral renal cysts are noted. There is a hemorrhagic cyst arising from the posterior cortex of the left kidney which measures 1.9 cm, image  34 of series 5. No mass or hydronephrosis. Stomach/Bowel: Visualized portions within the abdomen are unremarkable. Vascular/Lymphatic: No pathologically enlarged lymph nodes identified. No abdominal aortic aneurysm demonstrated. Other:  None. Musculoskeletal: No suspicious bone lesions identified. IMPRESSION: 1. Morphologic features a  liver compatible with cirrhosis 2. Type 1 choledochal cyst. 3. Gallstones.  No choledocholithiasis identified at this time. 4. Mild changes of pancreatitis. Multiple cystic structures are associated with the pancreas and in a patient with a history of pancreatitis these are favored to represent multiple pseudo cysts. These measure up to 1 cm. Followup imaging at 6 months with repeat MR of the pancreas is recommended to ensure stability and/or resolution of these abnormalities. 5. Bilateral Bosniak category 1 and 2 kidney cysts. 6. Cardiac enlargement and bilateral pleural effusions, right greater than left. Electronically Signed   By: Kerby Moors M.D.   On: 07/05/2016 10:32   Dg Foot 2 Views Right  Result Date: 07/05/2016 CLINICAL DATA:  Right foot pain with inability to weight bear EXAM: RIGHT FOOT - 2 VIEW COMPARISON:  None. 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 The bones are slightly demineralized. Prominent plantar and dorsal calcaneal enthesophytes. There is mild diffuse soft tissue swelling of the foot. 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 No acute fracture. 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 Calcaneal enthesophytes. Electronically Signed   By: Ashley Royalty M.D.   On: 07/05/2016 21:51   Mr Abdomen Mrcp Moise Boring Contast  Result Date: 07/05/2016 CLINICAL DATA:  Acute onset of generalized abdominal pain and nausea. EXAM: MRI ABDOMEN WITHOUT AND WITH CONTRAST (INCLUDING MRCP) TECHNIQUE: Multiplanar multisequence MR imaging of the abdomen was performed both before and after the administration of intravenous contrast. Heavily T2-weighted images of the biliary and pancreatic ducts were obtained, and three-dimensional MRCP images were rendered by post processing. CONTRAST:  48mL MULTIHANCE  GADOBENATE DIMEGLUMINE 529 MG/ML IV SOLN COMPARISON:  None. FINDINGS: Lower chest: There is cyst small to moderate right pleural effusion and a small left effusion. The heart size appears enlarged. Hepatobiliary: The liver appears cirrhotic. The contour the liver is diffusely irregular. There are no arterial phase enhancing structures within the liver to suggest [place]. Small stones are noted layering within the dependent portion of the gallbladder, image 31 of series 5. Cystic dilatation of the mid common bile duct measures up to 1.7 cm, image 17 of series 5. Compatible with type 1 choledochal cyst. The remainder of the common bile duct has a normal caliber. No choledocholithiasis identified. Pancreas: Mild diffuse pancreatic edema. A small amount of fluid is identified surrounding the tail of pancreas where there are several cystic structures measuring up to 1 cm, image 24 of series 5. A few smaller T2 hyperintense structures are identified within the head and body of the pancreas. No enhancing mass. No pancreatic duct dilatation identified. Spleen:  The spleen is normal. Adrenals/Urinary Tract: The adrenal glands are normal. Bilateral renal cysts are noted. There is a hemorrhagic cyst arising from the posterior cortex of the left kidney which measures 1.9 cm, image 34 of series 5. No mass or hydronephrosis. Stomach/Bowel: Visualized portions within the abdomen are unremarkable. Vascular/Lymphatic: No pathologically enlarged lymph nodes identified. No abdominal aortic aneurysm demonstrated. Other:  None. Musculoskeletal: No suspicious bone lesions identified. IMPRESSION: 1. Morphologic features a liver compatible with cirrhosis 2. Type 1  choledochal cyst. 3. Gallstones.  No choledocholithiasis identified at this time. 4. Mild changes of pancreatitis. Multiple cystic structures are associated with the pancreas and in a patient with a history of pancreatitis these are favored to represent multiple pseudo cysts. These  measure up to 1 cm. Followup imaging at 6 months with repeat MR of the pancreas is recommended to ensure stability and/or resolution of these abnormalities. 5. Bilateral Bosniak category 1 and 2 kidney cysts. 6. Cardiac enlargement and bilateral pleural effusions, right greater than left. Electronically Signed   By: Kerby Moors M.D.   On: 07/05/2016 10:32    Medications: I have reviewed the patient's current medications.  Assessment/Plan: 1. Gallstone pancreatitis. He clearly is improve. He still has some cystic areas in the pancreas. His INR still elevated and Coumadin on hold. 2. Possible cirrhosis based on MRI and ultrasound criteria. Etiology unclear. Patient is a nondrinker.  Recommendation: I would go ahead and have surgery see the patient. It may be that he should have surgery now or that it could be delayed to allow his pancreas to improve. Coordination with his anticoagulation will need to be decided upon. If it is decided to delay surgery until some future time, I would go ahead and start him on low-fat diet and see how he does clinically.   Vertis Bauder JR,Alailah Safley L 07/06/2016, 10:39 AM  This note was created using voice recognition software. Minor errors may Have occurred unintentionally.  Pager: [phone] If no answer or after hours call [phone]

## 2016-07-06 NOTE — Progress Notes (Signed)
Cardiac clearance provided - please contact us a needed perioperatively with management questions.  Fred Casino, [name], [facility] Attending Cardiologist [name]

## 2016-07-06 NOTE — Progress Notes (Signed)
PROGRESS NOTE  [name] [id number] DOB: April 28, 1929 DOA: 07/03/2016 PCP: [name], MD  HPI/Recap of past 24 hours:  Report foot feeling better, Denies abdominal pain, no n/v, lft trending down, lipase has normalized Denies sob, no chest pain He does report trouble swallowing pills  Assessment/Plan: Principal Problem:   Gallstone pancreatitis Active Problems:   Dyslipidemia   Diabetes mellitus, type II ([place])   Pulmonary HTN   PAF (paroxysmal atrial fibrillation) ([facility])   CAD (coronary artery disease)   Chronic diastolic CHF (congestive heart failure) ([place])   Gallstone pancreatitis:   Patient presented on 11/29  with abdominal pain, n/v, lft elevated and Lipase elevated at 1675 on admission.  US-abd showed cholelithiasis without signs of cholecystitis. No dilation of common bile duct. No fever or leukocytosis, less likely to have cholecystitis or ascending cholangitis. - -MRCP on 11/30 showed pancreatitis, pancreatic cyst, cirrhosis, gallstone, no CBD stone, . --he was kept npo initially, coumadin held since admission, his GI symptom has resolved. lft trending down but remain elevated, lipase normalized on 11/30, he is started on clears by [facility] -GI recommended general surgery consult   Cirrhosis: identified by MRCP from chronic chf?  Hepatitis panel pending, no history of alcohol use  Paroxysmal Atrial Fibrillation: CHA2DS2-VASc Score is 4,  Patient is on Coumadin at home. INR is 2.59 on admission. Sinus rhythm since admission, Heart rate is well controlled. -continue metoprolol -Hold Coumadin in case patient needed procedure -cardiology consulted   CAD: with h/o NSTEMI, s/p stent in 2016 - no CP. -Continue aspirin, metoprolol, statin  Chronic diastolic CHF (congestive heart failure), h/o MV repair in 10/2015 2-D echo 10/18/15 showed EF of 55-60 percent. Patient is on high-dose Lasix 80 mg twice a day currently. Patient has trace amount of leg  edema, no JVD. No shortness of breath. CHF is compensated on admission. -Continue metoprolol and aspirin -Hold Lasix since patient needed IV fluid for pancreatitis -BNP 617 on admission -+ pleural effusion identified on mrcp, patient is on room air, will d/c ivf, restart lasix bid  - cardiology consulted  HLD: Last LDL was 80 on 06/05/15 -Continue home medications: Pravastatin  Diet controlled DM-II: Last A1c 5.75/5/17, well controled. Patient is not taking medication at home -SSI  H/o chronic subdural hematoma, stable, no headache    gout flare: Bilateral foot , worse on the right, foot x ray consistent with gout flare.  uric acid elevated at 7.7 Start prednisone, colchicine, allopurinol, prn toradol, topical capsaicin   DVT ppx: SCD Code Status: Full code Family Communication: None at bed side.   Disposition Plan:  pending Consults called:   [facility] General surgery   Procedures:  MRCP  Antibiotics:  none   Objective: BP 125/60 (BP Location: Left Arm)   Pulse 65   Temp 98.7 F (37.1 C) (Oral)   Resp 20   Ht 5\' 9"  (1.753 m)   Wt 90.3 kg (199 lb)   SpO2 98%   BMI 29.39 kg/m   Intake/Output Summary (Last 24 hours) at 07/06/16 0921 Last data filed at 07/06/16 0300  Gross per 24 hour  Intake              810 ml  Output             1050 ml  Net             -240 ml   Filed Weights   07/05/16 0500 07/05/16 1846 07/06/16 0611  Weight:  88.2 kg (194 lb 8 oz) 95.5 kg (210 lb 8 oz) 90.3 kg (199 lb)    Exam:   General:  NAD, poor historian  Cardiovascular: RRR, + mumur  Respiratory: diminished bilateral lower lung fields, no rales, no rhonchi, no wheezing  Abdomen: Soft/ND/NT, positive BS  Musculoskeletal: No Edema  Neuro: aaox3,   Data Reviewed: Basic Metabolic Panel:  Recent Labs Lab 07/03/16 0050 07/03/16 0405 07/04/16 0248 07/05/16 0303 07/06/16 0508  NA 139 138 139 137 138  K 3.6 3.3* 3.2* 3.8 3.4*  CL 101 102 106 105  107  CO2 28 24 26 24 25   GLUCOSE 131* 129* 116* 115* 116*  BUN 17 18 12 10 9   CREATININE 1.20 1.11 1.01 0.93 0.92  CALCIUM 9.0 8.9 8.8* 8.9 8.7*  MG  --   --  2.1 1.9  --    Liver Function Tests:  Recent Labs Lab 07/03/16 0050 07/03/16 0405 07/04/16 0248 07/05/16 0303 07/06/16 0508  AST 134* 152* 126* 76* 50*  ALT 104* 103* 102* 75* 55  ALKPHOS 299* 310* 314* 278* 269*  BILITOT 2.9* 3.2* 2.9* 2.6* 2.4*  PROT 7.1 6.8 6.0* 5.7* 5.9*  ALBUMIN 3.1* 2.9* 2.6* 2.4* 2.3*    Recent Labs Lab 07/02/16 1420 07/03/16 0050 07/04/16 0248 07/05/16 0303  LIPASE 311* 1,675* 39 29    Recent Labs Lab 07/06/16 0508  AMMONIA 36*   CBC:  Recent Labs Lab 07/02/16 1420 07/03/16 0050 07/03/16 0405 07/04/16 0248  WBC 6.9 7.8 7.2 6.3  NEUTROABS 4,002  --   --   --   HGB 12.8* 12.8* 12.2* 11.9*  HCT 39.3 38.2* 36.9* 36.4*  MCV 92.5 91.8 91.8 92.2  PLT 175 158 149* 149*   Cardiac Enzymes:   No results for input(s): CKTOTAL, CKMB, CKMBINDEX, TROPONINI in the last 168 hours. BNP (last 3 results)  Recent Labs  12/08/15 1709 12/08/15 2209 07/03/16 0355  BNP 1,374.7* 1,647.0* 617.4*    ProBNP (last 3 results) No results for input(s): PROBNP in the last 8760 hours.  CBG:  Recent Labs Lab 07/05/16 0747 07/05/16 1116 07/05/16 1620 07/05/16 2147 07/06/16 0759  GLUCAP 118* 158* 132* 118* 119*    No results found for this or any previous visit (from the past 240 hour(s)).   Studies: Dg Foot 2 Views Right  Result Date: 07/05/2016 CLINICAL DATA:  Right foot pain with inability to weight bear EXAM: RIGHT FOOT - 2 VIEW COMPARISON:  None. 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 The bones are slightly demineralized. Prominent plantar and dorsal calcaneal enthesophytes. There is mild diffuse soft tissue swelling of the foot. There is osteoarthritis of the  midfoot articulations dorsally. No acute fracture. 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 Calcaneal enthesophytes. Electronically Signed   By: Ashley Royalty M.D.   On: 07/05/2016 21:51    Scheduled Meds: . aspirin EC  81 mg Oral QODAY  . capsaicin   Topical BID  . colchicine  0.6 mg Oral Daily  . finasteride  5 mg Oral Daily  . Influenza vac split quadrivalent PF  0.5 mL Intramuscular Tomorrow-1000  . insulin aspart  0-5 Units Subcutaneous QHS  . insulin aspart  0-9 Units Subcutaneous TID WC  . methylPREDNISolone (SOLU-MEDROL) injection  60 mg Intravenous Once  . metoprolol tartrate  12.5 mg Oral BID  .  pravastatin  40 mg Oral q1800  . [START ON 07/07/2016] predniSONE  50 mg Oral Q breakfast  . sodium chloride flush  3 mL Intravenous Q12H    Continuous Infusions:    Time spent: 34mins  Keairra Bardon MD, PhD  [facility] Pager [phone]. If 7PM-7AM, please contact night-coverage at [email or web address], password [facility] 07/06/2016, 9:21 AM  LOS: 3 days

## 2016-07-07 LAB — COMPREHENSIVE METABOLIC PANEL
ALT: 43 U/L (ref 17–63)
ANION GAP: 8 (ref 5–15)
AST: 34 U/L (ref 15–41)
Albumin: 2.1 g/dL — ABNORMAL LOW (ref 3.5–5.0)
Alkaline Phosphatase: 248 U/L — ABNORMAL HIGH (ref 38–126)
BUN: 14 mg/dL (ref 6–20)
CHLORIDE: 102 mmol/L (ref 101–111)
CO2: 25 mmol/L (ref 22–32)
Calcium: 8.6 mg/dL — ABNORMAL LOW (ref 8.9–10.3)
Creatinine, Ser: 0.84 mg/dL (ref 0.61–1.24)
Glucose, Bld: 128 mg/dL — ABNORMAL HIGH (ref 65–99)
POTASSIUM: 3.6 mmol/L (ref 3.5–5.1)
SODIUM: 135 mmol/L (ref 135–145)
Total Bilirubin: 1.9 mg/dL — ABNORMAL HIGH (ref 0.3–1.2)
Total Protein: 5.6 g/dL — ABNORMAL LOW (ref 6.5–8.1)

## 2016-07-07 LAB — GLUCOSE, CAPILLARY
GLUCOSE-CAPILLARY: 115 mg/dL — AB (ref 65–99)
GLUCOSE-CAPILLARY: 131 mg/dL — AB (ref 65–99)
GLUCOSE-CAPILLARY: 152 mg/dL — AB (ref 65–99)
Glucose-Capillary: 188 mg/dL — ABNORMAL HIGH (ref 65–99)

## 2016-07-07 LAB — HEPATITIS PANEL, ACUTE
HEP A IGM: NEGATIVE
HEP B S AG: NEGATIVE
Hep B C IgM: NEGATIVE

## 2016-07-07 LAB — PROTIME-INR
INR: 1.72
PROTHROMBIN TIME: 20.3 s — AB (ref 11.4–15.2)

## 2016-07-07 LAB — MAGNESIUM: MAGNESIUM: 2 mg/dL (ref 1.7–2.4)

## 2016-07-07 LAB — BRAIN NATRIURETIC PEPTIDE: B NATRIURETIC PEPTIDE 5: 1191.4 pg/mL — AB (ref 0.0–100.0)

## 2016-07-07 MED ORDER — POTASSIUM CHLORIDE CRYS ER 20 MEQ PO TBCR: 40.0000 meq | EXTENDED_RELEASE_TABLET | Freq: Once | ORAL | Status: DC

## 2016-07-07 MED ORDER — POTASSIUM CHLORIDE 20 MEQ/15ML (10%) PO SOLN
40.0000 meq | Freq: Once | ORAL | Status: AC
  Administered 2016-07-07: 40 meq via ORAL
  Filled 2016-07-07: qty 30

## 2016-07-07 NOTE — Progress Notes (Signed)
[facility] PROGRESS NOTE Subjective patient feels well without pain. Appreciate help from Dr. Kae Heller. Possible cholecystectomy next week after INR closer to the normal range.  Objective: Vital signs in last 24 hours: Temp:  [97.6 F (36.4 C)-98 F (36.7 C)] 97.6 F (36.4 C) (12/03 0616) Pulse Rate:  [57-70] 61 (12/03 0825) Resp:  [16-18] 18 (12/03 0616) BP: (124-138)/(58-76) 138/70 (12/03 0825) SpO2:  [97 %-100 %] 98 % (12/03 0616) Weight:  [90.6 kg (199 lb 11.2 oz)] 90.6 kg (199 lb 11.2 oz) (12/03 0616) Last BM Date: 07/05/16  Intake/Output from previous day: 12/02 0701 - 12/03 0700 In: 600 [P.O.:600] Out: 250 [Urine:250] Intake/Output this shift: No intake/output data recorded.  PE: General-- no acute distress denies pain  Lungs-- clear Abdomen-- soft completely nontender  Lab Results: No results for input(s): WBC, HGB, HCT, PLT in the last 72 hours. BMET  Recent Labs  07/05/16 0303 07/06/16 0508 07/07/16 0510  NA 137 138 135  K 3.8 3.4* 3.6  CL 105 107 102  CO2 24 25 25   CREATININE 0.93 0.92 0.84   LFT  Recent Labs  07/05/16 0303 07/06/16 0508 07/07/16 0510  PROT 5.7* 5.9* 5.6*  AST 76* 50* 34  ALT 75* 55 43  ALKPHOS 278* 269* 248*  BILITOT 2.6* 2.4* 1.9*   PT/INR  Recent Labs  07/05/16 0828 07/06/16 0508 07/07/16 0510  LABPROT 23.5* 23.3* 20.3*  INR 2.06 2.04 1.72   PANCREAS  Recent Labs  07/05/16 0303  LIPASE 29         Studies/Results: Dg Foot 2 Views Right  Result Date: 07/05/2016 CLINICAL DATA:  Right foot pain with inability to weight bear EXAM: RIGHT FOOT - 2 VIEW COMPARISON:  None. 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 The bones are slightly demineralized. Prominent plantar and dorsal calcaneal enthesophytes. There is mild diffuse soft tissue swelling of the foot. There is osteoarthritis  of the midfoot articulations dorsally. No acute fracture. 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 Calcaneal enthesophytes. Electronically Signed   By: Ashley Royalty M.D.   On: 07/05/2016 21:51    Medications: I have reviewed the patient's current medications.  Assessment/Plan: 1. Gallstone pancreatitis. MRCP set goes no signs of CBD stones but does show choledochal cysts and probable cirrhosis. Patient is a nondrinker, hepatitis markers all negative. LFTs actually improving. Albumin somewhat low 2.1. Plan: plan is to allow his INR to improve to normal and proceed with laparoscopic cholecystectomy next week. If feasible at the time of surgery, liver biopsy may be helpful if it does not add any increased risk to the laparoscopic cholecystectomy. We will be available should there be CBD stones etc. please give [place] a call.   [name],[name] 07/07/2016, 10:25 AM  This note was created using voice recognition software. Minor errors may Have occurred unintentionally.  Pager: [phone] If no answer or after hours call [phone]

## 2016-07-07 NOTE — Progress Notes (Signed)
PROGRESS NOTE  [name] [id number] DOB: 1928/11/28 DOA: 07/03/2016 PCP: [name], MD  HPI/Recap of past 24 hours:  Report foot feeling better, Denies abdominal pain, no n/v, lft trending down, lipase has normalized Denies sob, no chest pain He does report trouble swallowing pills for a long time  Assessment/Plan: Principal Problem:   Gallstone pancreatitis Active Problems:   Dyslipidemia   Diabetes mellitus, type II ([place])   Pulmonary HTN   PAF (paroxysmal atrial fibrillation) (HCC)   CAD (coronary artery disease)   Chronic diastolic CHF (congestive heart failure) ([place])   Gallstone pancreatitis:   Patient presented on 11/29  with abdominal pain, n/v, lft elevated and Lipase elevated at 1675 on admission.  US-abd showed cholelithiasis without signs of cholecystitis. No dilation of common bile duct. No fever or leukocytosis, less likely to have cholecystitis or ascending cholangitis. - -MRCP on 11/30 showed pancreatitis, pancreatic cyst, cirrhosis, gallstone, no CBD stone, . --his GI symptom has resolved. lft trending down, lipase normalized on 11/30, he is started on clears by [facility] - general surgery consulted, plan to have surgery once INR less than 1.5, GI recommended liver biopsy recommended during surgery    Cirrhosis: identified by MRCP from chronic chf?  Hepatitis panel negative, no history of alcohol use lft trending down GI recommended liver biopsy recommended during surgery   Hypokalemia: replace k  Paroxysmal Atrial Fibrillation: CHA2DS2-VASc Score is 4,  Patient is on Coumadin at home. INR is 2.59 on admission. Sinus rhythm since admission, Heart rate is well controlled. -continue metoprolol, keep k>4, mag >2 -Hold Coumadin in case patient needed procedure -cardiology consulted   CAD: with h/o NSTEMI, s/p stent in 2016 - no CP. -Continue aspirin, metoprolol, statin  Chronic diastolic CHF (congestive heart failure), h/o MV repair  in 10/2015 2-D echo 10/18/15 showed EF of 55-60 percent. Patient is on high-dose Lasix 80 mg twice a day currently. Patient has trace amount of leg edema, no JVD. No shortness of breath. CHF is compensated on admission. -Continue metoprolol and aspirin -Hold Lasix since patient needed IV fluid for pancreatitis -BNP 617 on admission -+ pleural effusion identified on mrcp, patient is on room air, will d/c ivf, restart lasix bid  - cardiology consulted  HLD: Last LDL was 80 on 06/05/15 -Continue home medications: Pravastatin  Diet controlled DM-II: Last A1c 5.75/5/17, well controled. Patient is not taking medication at home -SSI  H/o chronic subdural hematoma, stable, no headache    gout flare: Bilateral foot , worse on the right, foot x ray consistent with gout flare.  uric acid elevated at 7.7 Start prednisone, colchicine, allopurinol, prn toradol, topical capsaicin  Deconditioning/FTT: dysphagia, speech/PT, dysphagia diet, aspiration precaution, PT recommended SNF placement  DVT ppx: SCD Code Status: Full code Family Communication: None at bed side.   Disposition Plan:  pending  Consults called:   [facility] General surgery   Procedures:  MRCP  Antibiotics:  none   Objective: BP 138/70   Pulse 61   Temp 97.6 F (36.4 C) (Oral)   Resp 18   Ht 5\' 9"  (1.753 m)   Wt 90.6 kg (199 lb 11.2 oz)   SpO2 98%   BMI 29.49 kg/m   Intake/Output Summary (Last 24 hours) at 07/07/16 0854 Last data filed at 07/06/16 1917  Gross per 24 hour  Intake              600 ml  Output  250 ml  Net              350 ml   Filed Weights   07/05/16 1846 07/06/16 0611 07/07/16 0616  Weight: 95.5 kg (210 lb 8 oz) 90.3 kg (199 lb) 90.6 kg (199 lb 11.2 oz)    Exam:   General:  NAD, poor historian  Cardiovascular: RRR, + mumur  Respiratory: diminished bilateral lower lung fields, no rales, no rhonchi, no wheezing  Abdomen: Soft/ND/NT, positive  BS  Musculoskeletal: No Edema, ankle tender to touch , worse on the right,   Neuro: aaox3,   Data Reviewed: Basic Metabolic Panel:  Recent Labs Lab 07/03/16 0405 07/04/16 0248 07/05/16 0303 07/06/16 0508 07/07/16 0510  NA 138 139 137 138 135  K 3.3* 3.2* 3.8 3.4* 3.6  CL 102 106 105 107 102  CO2 24 26 24 25 25   GLUCOSE 129* 116* 115* 116* 128*  BUN 18 12 10 9 14   CREATININE 1.11 1.01 0.93 0.92 0.84  CALCIUM 8.9 8.8* 8.9 8.7* 8.6*  MG  --  2.1 1.9  --  2.0   Liver Function Tests:  Recent Labs Lab 07/03/16 0405 07/04/16 0248 07/05/16 0303 07/06/16 0508 07/07/16 0510  AST 152* 126* 76* 50* 34  ALT 103* 102* 75* 55 43  ALKPHOS 310* 314* 278* 269* 248*  BILITOT 3.2* 2.9* 2.6* 2.4* 1.9*  PROT 6.8 6.0* 5.7* 5.9* 5.6*  ALBUMIN 2.9* 2.6* 2.4* 2.3* 2.1*    Recent Labs Lab 07/02/16 1420 07/03/16 0050 07/04/16 0248 07/05/16 0303  LIPASE 311* 1,675* 39 29    Recent Labs Lab 07/06/16 0508  AMMONIA 36*   CBC:  Recent Labs Lab 07/02/16 1420 07/03/16 0050 07/03/16 0405 07/04/16 0248  WBC 6.9 7.8 7.2 6.3  NEUTROABS 4,002  --   --   --   HGB 12.8* 12.8* 12.2* 11.9*  HCT 39.3 38.2* 36.9* 36.4*  MCV 92.5 91.8 91.8 92.2  PLT 175 158 149* 149*   Cardiac Enzymes:   No results for input(s): CKTOTAL, CKMB, CKMBINDEX, TROPONINI in the last 168 hours. BNP (last 3 results)  Recent Labs  07/03/16 0355 07/06/16 0508 07/07/16 0510  BNP 617.4* 1,127.6* 1,191.4*    ProBNP (last 3 results) No results for input(s): PROBNP in the last 8760 hours.  CBG:  Recent Labs Lab 07/06/16 1213 07/06/16 1648 07/06/16 2045 07/06/16 2147 07/07/16 0803  GLUCAP 136* 173* 246* 188* 115*    No results found for this or any previous visit (from the past 240 hour(s)).   Studies: No results found.  Scheduled Meds: . allopurinol  100 mg Oral Daily  . aspirin EC  81 mg Oral QODAY  . capsaicin   Topical BID  . finasteride  5 mg Oral Daily  . furosemide  40 mg  Intravenous BID  . insulin aspart  0-5 Units Subcutaneous QHS  . insulin aspart  0-9 Units Subcutaneous TID WC  . metoprolol tartrate  12.5 mg Oral BID  . potassium chloride  40 mEq Oral Once  . pravastatin  40 mg Oral q1800  . predniSONE  50 mg Oral Q breakfast  . sodium chloride flush  3 mL Intravenous Q12H    Continuous Infusions:    Time spent: 24mins  Jaelen Soth MD, PhD  [facility] Pager [phone]. If 7PM-7AM, please contact night-coverage at [email or web address], password [facility] 07/07/2016, 8:54 AM  LOS: 4 days

## 2016-07-07 NOTE — Progress Notes (Signed)
  Subjective: Pt doing well no abd pain   Objective: Vital signs in last 24 hours: Temp:  [97.6 F (36.4 C)-98 F (36.7 C)] 97.6 F (36.4 C) (12/03 0616) Pulse Rate:  [57-70] 61 (12/03 0825) Resp:  [16-18] 18 (12/03 0616) BP: (124-138)/(58-76) 138/70 (12/03 0825) SpO2:  [97 %-100 %] 98 % (12/03 0616) Weight:  [90.6 kg (199 lb 11.2 oz)] 90.6 kg (199 lb 11.2 oz) (12/03 0616) Last BM Date: 07/05/16  Intake/Output from previous day: 12/02 0701 - 12/03 0700 In: 600 [P.O.:600] Out: 250 [Urine:250] Intake/Output this shift: No intake/output data recorded.  General appearance: alert and cooperative GI: soft, non-tender; bowel sounds normal; no masses,  no organomegaly  Lab Results:  No results for input(s): WBC, HGB, HCT, PLT in the last 72 hours. BMET  Recent Labs  07/06/16 0508 07/07/16 0510  NA 138 135  K 3.4* 3.6  CL 107 102  CO2 25 25  GLUCOSE 116* 128*  BUN 9 14  CREATININE 0.92 0.84  CALCIUM 8.7* 8.6*   PT/INR  Recent Labs  07/06/16 0508 07/07/16 0510  LABPROT 23.3* 20.3*  INR 2.04 1.72   ABG No results for input(s): PHART, HCO3 in the last 72 hours.  Invalid input(s): PCO2, PO2  Studies/Results: Dg Foot 2 Views Right  Result Date: 07/05/2016 CLINICAL DATA:  Right foot pain with inability to weight bear EXAM: RIGHT FOOT - 2 VIEW COMPARISON:  None. 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 The bones are slightly demineralized. Prominent plantar and dorsal calcaneal enthesophytes. There is mild diffuse soft tissue swelling of the foot. 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 No acute fracture. 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 Calcaneal enthesophytes. Electronically Signed   By: Ashley Royalty M.D.   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 Await INR to drop below 1.5 prior to surgery.  May be Mon 2. con't with CLD 3. moilize as tol   LOS: 4 days    Rosario Jacks., Anne Hahn 07/07/2016

## 2016-07-08 ENCOUNTER — Inpatient Hospital Stay (HOSPITAL_COMMUNITY): Payer: Medicare Other | Admitting: Certified Registered"

## 2016-07-08 ENCOUNTER — Telehealth: Payer: Self-pay | Admitting: Urology

## 2016-07-08 ENCOUNTER — Encounter (HOSPITAL_COMMUNITY)
Admission: EM | Disposition: A | Discharge status: Home Health | Payer: Self-pay | Source: Home / Self Care | Attending: Internal Medicine

## 2016-07-08 ENCOUNTER — Encounter (HOSPITAL_COMMUNITY): Payer: Self-pay | Admitting: *Deleted

## 2016-07-08 HISTORY — PX: CHOLECYSTECTOMY: SHX55

## 2016-07-08 LAB — CBC
HEMATOCRIT: 36.1 % — AB (ref 39.0–52.0)
Hemoglobin: 11.8 g/dL — ABNORMAL LOW (ref 13.0–17.0)
MCH: 29.9 pg (ref 26.0–34.0)
MCHC: 32.7 g/dL (ref 30.0–36.0)
MCV: 91.6 fL (ref 78.0–100.0)
PLATELETS: 155 10*3/uL (ref 150–400)
RBC: 3.94 MIL/uL — ABNORMAL LOW (ref 4.22–5.81)
RDW: 14.6 % (ref 11.5–15.5)
WBC: 8.8 10*3/uL (ref 4.0–10.5)

## 2016-07-08 LAB — COMPREHENSIVE METABOLIC PANEL
ALBUMIN: 2.3 g/dL — AB (ref 3.5–5.0)
ALK PHOS: 229 U/L — AB (ref 38–126)
ALT: 39 U/L (ref 17–63)
AST: 35 U/L (ref 15–41)
Anion gap: 6 (ref 5–15)
BILIRUBIN TOTAL: 1.2 mg/dL (ref 0.3–1.2)
BUN: 16 mg/dL (ref 6–20)
CO2: 27 mmol/L (ref 22–32)
CREATININE: 0.81 mg/dL (ref 0.61–1.24)
Calcium: 8.6 mg/dL — ABNORMAL LOW (ref 8.9–10.3)
Chloride: 99 mmol/L — ABNORMAL LOW (ref 101–111)
GFR calc Af Amer: >60 mL/min (ref >60)
GFR calc non Af Amer: >60 mL/min (ref >60)
GLUCOSE: 103 mg/dL — AB (ref 65–99)
POTASSIUM: 3.5 mmol/L (ref 3.5–5.1)
Sodium: 132 mmol/L — ABNORMAL LOW (ref 135–145)
TOTAL PROTEIN: 6.2 g/dL — AB (ref 6.5–8.1)

## 2016-07-08 LAB — MAGNESIUM: Magnesium: 1.9 mg/dL (ref 1.7–2.4)

## 2016-07-08 LAB — GLUCOSE, CAPILLARY
GLUCOSE-CAPILLARY: 85 mg/dL (ref 65–99)
GLUCOSE-CAPILLARY: 116 mg/dL — AB (ref 65–99)
GLUCOSE-CAPILLARY: 169 mg/dL — AB (ref 65–99)
Glucose-Capillary: 91 mg/dL (ref 65–99)
Glucose-Capillary: 107 mg/dL — ABNORMAL HIGH (ref 65–99)
Glucose-Capillary: 179 mg/dL — ABNORMAL HIGH (ref 65–99)

## 2016-07-08 LAB — MRSA PCR SCREENING: MRSA BY PCR: NEGATIVE

## 2016-07-08 LAB — PROTIME-INR
INR: 1.56
Prothrombin Time: 18.8 seconds — ABNORMAL HIGH (ref 11.4–15.2)

## 2016-07-08 LAB — BRAIN NATRIURETIC PEPTIDE: B NATRIURETIC PEPTIDE 5: 974.7 pg/mL — AB (ref 0.0–100.0)

## 2016-07-08 SURGERY — LAPAROSCOPIC CHOLECYSTECTOMY WITH INTRAOPERATIVE CHOLANGIOGRAM
Anesthesia: General | Site: Abdomen

## 2016-07-08 MED ORDER — POTASSIUM CHLORIDE IN NACL 20-0.9 MEQ/L-% IV SOLN
INTRAVENOUS | Status: DC
  Administered 2016-07-08: 09:00:00 via INTRAVENOUS
  Filled 2016-07-08: qty 1000

## 2016-07-08 MED ORDER — BUPIVACAINE-EPINEPHRINE (PF) 0.25% -1:200000 IJ SOLN
INTRAMUSCULAR | Status: AC
  Filled 2016-07-08: qty 30

## 2016-07-08 MED ORDER — SUGAMMADEX SODIUM 200 MG/2ML IV SOLN
INTRAVENOUS | Status: AC
  Filled 2016-07-08: qty 2

## 2016-07-08 MED ORDER — CEFAZOLIN SODIUM 1 G IJ SOLR
INTRAMUSCULAR | Status: DC | PRN
  Administered 2016-07-08: 2 g via INTRAMUSCULAR

## 2016-07-08 MED ORDER — HYDROMORPHONE HCL 1 MG/ML IJ SOLN
INTRAMUSCULAR | Status: AC
  Filled 2016-07-08: qty 1

## 2016-07-08 MED ORDER — CEFAZOLIN (ANCEF) 1 G IV SOLR
2.0000 g | INTRAVENOUS | Status: AC
  Filled 2016-07-08: qty 2

## 2016-07-08 MED ORDER — CEFAZOLIN SODIUM 1 G IJ SOLR
INTRAMUSCULAR | Status: AC
  Filled 2016-07-08: qty 20

## 2016-07-08 MED ORDER — 0.9 % SODIUM CHLORIDE (POUR BTL) OPTIME
TOPICAL | Status: DC | PRN
  Administered 2016-07-08: 1000 mL

## 2016-07-08 MED ORDER — OXYCODONE HCL 5 MG PO TABS: 5.0000 mg | ORAL_TABLET | ORAL | Status: DC | PRN

## 2016-07-08 MED ORDER — ROCURONIUM BROMIDE 10 MG/ML (PF) SYRINGE
PREFILLED_SYRINGE | INTRAVENOUS | Status: AC
  Filled 2016-07-08: qty 10

## 2016-07-08 MED ORDER — ONDANSETRON HCL 4 MG/2ML IJ SOLN
INTRAMUSCULAR | Status: DC | PRN
  Administered 2016-07-08: 4 mg via INTRAVENOUS

## 2016-07-08 MED ORDER — WARFARIN - PHARMACIST DOSING INPATIENT
Freq: Every day | Status: DC
  Administered 2016-07-08: 17:00:00

## 2016-07-08 MED ORDER — ROCURONIUM BROMIDE 100 MG/10ML IV SOLN
INTRAVENOUS | Status: DC | PRN
  Administered 2016-07-08: 50 mg via INTRAVENOUS

## 2016-07-08 MED ORDER — FENTANYL CITRATE (PF) 100 MCG/2ML IJ SOLN
INTRAMUSCULAR | Status: DC | PRN
  Administered 2016-07-08: 50 ug via INTRAVENOUS
  Administered 2016-07-08: 100 ug via INTRAVENOUS
  Administered 2016-07-08: 50 ug via INTRAVENOUS

## 2016-07-08 MED ORDER — PROPOFOL 10 MG/ML IV BOLUS
INTRAVENOUS | Status: DC | PRN
  Administered 2016-07-08: 30 mg via INTRAVENOUS
  Administered 2016-07-08: 100 mg via INTRAVENOUS

## 2016-07-08 MED ORDER — WARFARIN SODIUM 6 MG PO TABS
7.0000 mg | ORAL_TABLET | Freq: Once | ORAL | Status: AC
  Administered 2016-07-08: 7 mg via ORAL
  Filled 2016-07-08: qty 1

## 2016-07-08 MED ORDER — POTASSIUM CHLORIDE 20 MEQ/15ML (10%) PO SOLN
40.0000 meq | Freq: Once | ORAL | Status: AC
  Administered 2016-07-08: 40 meq via ORAL
  Filled 2016-07-08: qty 30

## 2016-07-08 MED ORDER — SODIUM CHLORIDE 0.9 % IV SOLN
INTRAVENOUS | Status: DC | PRN
  Administered 2016-07-08: 50 mL

## 2016-07-08 MED ORDER — FENTANYL CITRATE (PF) 100 MCG/2ML IJ SOLN
INTRAMUSCULAR | Status: AC
  Filled 2016-07-08: qty 2

## 2016-07-08 MED ORDER — LIDOCAINE HCL (CARDIAC) 20 MG/ML IV SOLN
INTRAVENOUS | Status: DC | PRN
  Administered 2016-07-08: 60 mg via INTRATRACHEAL

## 2016-07-08 MED ORDER — IOPAMIDOL (ISOVUE-300) INJECTION 61%
INTRAVENOUS | Status: AC
  Filled 2016-07-08: qty 50

## 2016-07-08 MED ORDER — SUGAMMADEX SODIUM 200 MG/2ML IV SOLN
INTRAVENOUS | Status: DC | PRN
  Administered 2016-07-08: 200 mg via INTRAVENOUS

## 2016-07-08 MED ORDER — BUPIVACAINE-EPINEPHRINE 0.25% -1:200000 IJ SOLN
INTRAMUSCULAR | Status: DC | PRN
  Administered 2016-07-08: 21 mL

## 2016-07-08 MED ORDER — HEMOSTATIC AGENTS (NO CHARGE) OPTIME
TOPICAL | Status: DC | PRN
  Administered 2016-07-08: 1 via TOPICAL

## 2016-07-08 MED ORDER — LACTATED RINGERS IV SOLN
INTRAVENOUS | Status: DC
Start: 2016-07-08 — End: 2016-07-08
  Administered 2016-07-08: 11:00:00 via INTRAVENOUS

## 2016-07-08 MED ORDER — HYDROMORPHONE HCL 1 MG/ML IJ SOLN
INTRAMUSCULAR | Status: DC | PRN
  Administered 2016-07-08: .25 mg via INTRAVENOUS

## 2016-07-08 MED ORDER — ONDANSETRON HCL 4 MG/2ML IJ SOLN
INTRAMUSCULAR | Status: AC
  Filled 2016-07-08: qty 2

## 2016-07-08 MED ORDER — PROPOFOL 10 MG/ML IV BOLUS
INTRAVENOUS | Status: AC
  Filled 2016-07-08: qty 20

## 2016-07-08 MED ORDER — SODIUM CHLORIDE 0.9 % IR SOLN
Status: DC | PRN
Start: 2016-07-08 — End: 2016-07-08
  Administered 2016-07-08: 1000 mL

## 2016-07-08 SURGICAL SUPPLY — 43 items
APPLIER CLIP 5 13 M/L LIGAMAX5 (MISCELLANEOUS) ×3
BLADE SURG ROTATE 9660 (MISCELLANEOUS) IMPLANT
CANISTER SUCTION 2500CC (MISCELLANEOUS) ×3 IMPLANT
CHLORAPREP W/TINT 26ML (MISCELLANEOUS) ×3 IMPLANT
CLIP APPLIE 5 13 M/L LIGAMAX5 (MISCELLANEOUS) ×1 IMPLANT
COVER MAYO STAND STRL (DRAPES) ×3 IMPLANT
COVER SURGICAL LIGHT HANDLE (MISCELLANEOUS) ×3 IMPLANT
DERMABOND ADVANCED (GAUZE/BANDAGES/DRESSINGS) ×2
DERMABOND ADVANCED .7 DNX12 (GAUZE/BANDAGES/DRESSINGS) ×1 IMPLANT
DRAPE C-ARM 42X72 X-RAY (DRAPES) ×3 IMPLANT
ELECT REM PT RETURN 9FT ADLT (ELECTROSURGICAL) ×3
ELECTRODE REM PT RTRN 9FT ADLT (ELECTROSURGICAL) ×1 IMPLANT
FILTER SMOKE EVAC LAPAROSHD (FILTER) IMPLANT
GLOVE BIO SURGEON STRL SZ8 (GLOVE) ×3 IMPLANT
GLOVE BIOGEL PI IND STRL 8 (GLOVE) ×1 IMPLANT
GLOVE BIOGEL PI INDICATOR 8 (GLOVE) ×2
GOWN STRL REUS W/ TWL LRG LVL3 (GOWN DISPOSABLE) ×2 IMPLANT
GOWN STRL REUS W/ TWL XL LVL3 (GOWN DISPOSABLE) ×1 IMPLANT
GOWN STRL REUS W/TWL LRG LVL3 (GOWN DISPOSABLE) ×4
GOWN STRL REUS W/TWL XL LVL3 (GOWN DISPOSABLE) ×2
HEMOSTAT SNOW SURGICEL 2X4 (HEMOSTASIS) ×3 IMPLANT
KIT BASIN OR (CUSTOM PROCEDURE TRAY) ×3 IMPLANT
KIT ROOM TURNOVER OR (KITS) ×3 IMPLANT
L-HOOK LAP DISP 36CM (ELECTROSURGICAL) ×3
LHOOK LAP DISP 36CM (ELECTROSURGICAL) ×1 IMPLANT
NEEDLE 22X1 1/2 (OR ONLY) (NEEDLE) ×3 IMPLANT
NS IRRIG 1000ML POUR BTL (IV SOLUTION) ×3 IMPLANT
PAD ARMBOARD 7.5X6 YLW CONV (MISCELLANEOUS) ×3 IMPLANT
PENCIL BUTTON HOLSTER BLD 10FT (ELECTRODE) ×3 IMPLANT
POUCH RETRIEVAL ECOSAC 10 (ENDOMECHANICALS) ×1 IMPLANT
POUCH RETRIEVAL ECOSAC 10MM (ENDOMECHANICALS) ×2
SCISSORS LAP 5X35 DISP (ENDOMECHANICALS) ×3 IMPLANT
SET CHOLANGIOGRAPH 5 50 .035 (SET/KITS/TRAYS/PACK) ×3 IMPLANT
SET IRRIG TUBING LAPAROSCOPIC (IRRIGATION / IRRIGATOR) ×3 IMPLANT
SLEEVE ENDOPATH XCEL 5M (ENDOMECHANICALS) ×6 IMPLANT
SPECIMEN JAR SMALL (MISCELLANEOUS) ×3 IMPLANT
SUT VIC AB 4-0 PS2 27 (SUTURE) ×3 IMPLANT
TOWEL OR 17X24 6PK STRL BLUE (TOWEL DISPOSABLE) ×3 IMPLANT
TOWEL OR 17X26 10 PK STRL BLUE (TOWEL DISPOSABLE) ×3 IMPLANT
TRAY LAPAROSCOPIC MC (CUSTOM PROCEDURE TRAY) ×3 IMPLANT
TROCAR XCEL BLUNT TIP 100MML (ENDOMECHANICALS) ×3 IMPLANT
TROCAR XCEL NON-BLD 5MMX100MML (ENDOMECHANICALS) ×3 IMPLANT
TUBING INSUFFLATION (TUBING) ×3 IMPLANT

## 2016-07-08 NOTE — Progress Notes (Signed)
PROGRESS NOTE  [name] [id number] DOB: [birth year] DOA: 07/03/2016 PCP: [name], MD  HPI/Recap of past 24 hours:  Patient seen after coming back from OR with multiple family members in room  Denies abdominal pain, no n/v,  Denies sob, no chest pain  Report gout pain has much improved  He does report trouble swallowing pills for a long time  Assessment/Plan: Principal Problem:   Gallstone pancreatitis Active Problems:   Dyslipidemia   Diabetes mellitus, type II ([place])   Pulmonary HTN   PAF (paroxysmal atrial fibrillation) (HCC)   CAD (coronary artery disease)   Chronic diastolic CHF (congestive heart failure) ([place])   Gallstone pancreatitis:   Patient presented on 11/29  with abdominal pain, n/v, lft elevated and Lipase elevated at 1675 on admission.  US-abd showed cholelithiasis without signs of cholecystitis. No dilation of common bile duct. No fever or leukocytosis, less likely to have cholecystitis or ascending cholangitis. - -MRCP on 11/30 showed pancreatitis, pancreatic cyst, cirrhosis, gallstone, no CBD stone, . --his GI symptom has resolved. lft trending down, lipase normalized on 11/30 -  GI recommended liver biopsy recommended during surgery which was not able to be done due to "nodular and diseased appearing liver which was very friable precluding liver biopsy." -S/p LAPAROSCOPIC CHOLECYSTECTOMY on 12/4, diet advancement per GI/general surgery. Coumadin resumed post op.   Cirrhosis: new diagnosis, identified by MRCP from chronic chf?  Hepatitis panel negative, no history of alcohol use lft trending down liver biopsy not done, see note above  Hypokalemia: replace k  Paroxysmal Atrial Fibrillation: CHA2DS2-VASc Score is 4,  Patient is on Coumadin at home. INR is 2.59 on admission. Sinus rhythm since admission, Heart rate is well controlled. -continue metoprolol, keep k>4, mag >2 -Coumadin held on admission, resumed on 12/4 post  op -cardiology consulted   CAD: with h/o NSTEMI, s/p stent in 2016 - no CP. -Continue aspirin, metoprolol, statin  Chronic diastolic CHF (congestive heart failure), h/o MV repair in 10/2015 2-D echo 10/18/15 showed EF of 55-60 percent. Patient is on high-dose Lasix 80 mg twice a day currently. Patient has trace amount of leg edema, no JVD. No shortness of breath. CHF is compensated on admission. -Continue metoprolol and aspirin -Lasix held on admission and patient received IV fluid for pancreatitis -BNP 617 on admission -+ pleural effusion identified on mrcp, patient is on room air, avoid ivf, restart lasix bid  - cardiology consulted  HLD: Last LDL was 80 on 06/05/15 -Continue home medications: Pravastatin  Diet controlled DM-II: Last A1c 5.75/5/17, well controled. Patient is not taking medication at home -SSI  H/o chronic subdural hematoma, stable, no headache    Gout flare: Bilateral foot , worse on the right, foot x ray consistent with gout flare.  uric acid elevated at 7.7 Start prednisone, colchicine, allopurinol, prn toradol, topical capsaicin  Deconditioning/FTT: dysphagia, speech/PT, dysphagia diet, aspiration precaution, PT recommended SNF placement, but patient want to go home  DVT ppx: SCD/coumadin Code Status: Full code Family Communication: patient and multiple family members in room on 12/4 Disposition Plan:  home with home health with general surgery clearance  Consults called:   [facility] General surgery   Procedures:  MRCP  LAPAROSCOPIC CHOLECYSTECTOMY on 12/4  Antibiotics:  Perioperative ancef   Objective: BP 132/64 (BP Location: Left Arm)   Pulse (!) 55   Temp 97.7 F (36.5 C)   Resp 18   Ht 5\' 9"  (1.753 m)   Wt 90 kg (198 lb  8 oz)   SpO2 97%   BMI 29.31 kg/m   Intake/Output Summary (Last 24 hours) at 07/08/16 1239 Last data filed at 07/08/16 1217  Gross per 24 hour  Intake              500 ml  Output               450 ml  Net               50 ml   Filed Weights   07/06/16 0611 07/07/16 0616 07/08/16 0445  Weight: 90.3 kg (199 lb) 90.6 kg (199 lb 11.2 oz) 90 kg (198 lb 8 oz)    Exam:   General:  NAD, poor historian  Cardiovascular: RRR, + mumur  Respiratory: diminished bilateral lower lung fields, no rales, no rhonchi, no wheezing  Abdomen:  positive BS, post op changes  Musculoskeletal: No Edema, bilateral foot tenderness has much improved   Neuro: aaox3  Data Reviewed: Basic Metabolic Panel:  Recent Labs Lab 07/04/16 0248 07/05/16 0303 07/06/16 0508 07/07/16 0510 07/08/16 0546  NA 139 137 138 135 132*  K 3.2* 3.8 3.4* 3.6 3.5  CL 106 105 107 102 99*  CO2 26 24 25 25 27   GLUCOSE 116* 115* 116* 128* 103*  BUN 12 10 9 14 16   CREATININE 1.01 0.93 0.92 0.84 0.81  CALCIUM 8.8* 8.9 8.7* 8.6* 8.6*  MG 2.1 1.9  --  2.0 1.9   Liver Function Tests:  Recent Labs Lab 07/04/16 0248 07/05/16 0303 07/06/16 0508 07/07/16 0510 07/08/16 0546  AST 126* 76* 50* 34 35  ALT 102* 75* 55 43 39  ALKPHOS 314* 278* 269* 248* 229*  BILITOT 2.9* 2.6* 2.4* 1.9* 1.2  PROT 6.0* 5.7* 5.9* 5.6* 6.2*  ALBUMIN 2.6* 2.4* 2.3* 2.1* 2.3*    Recent Labs Lab 07/02/16 1420 07/03/16 0050 07/04/16 0248 07/05/16 0303  LIPASE 311* 1,675* 39 29    Recent Labs Lab 07/06/16 0508  AMMONIA 36*   CBC:  Recent Labs Lab 07/02/16 1420 07/03/16 0050 07/03/16 0405 07/04/16 0248 07/08/16 0546  WBC 6.9 7.8 7.2 6.3 8.8  NEUTROABS 4,002  --   --   --   --   HGB 12.8* 12.8* 12.2* 11.9* 11.8*  HCT 39.3 38.2* 36.9* 36.4* 36.1*  MCV 92.5 91.8 91.8 92.2 91.6  PLT 175 158 149* 149* 155   Cardiac Enzymes:   No results for input(s): CKTOTAL, CKMB, CKMBINDEX, TROPONINI in the last 168 hours. BNP (last 3 results)  Recent Labs  07/06/16 0508 07/07/16 0510 07/08/16 0546  BNP 1,127.6* 1,191.4* 974.7*    ProBNP (last 3 results) No results for input(s): PROBNP in the last 8760  hours.  CBG:  Recent Labs Lab 07/07/16 1742 07/07/16 2057 07/08/16 0757 07/08/16 1011 07/08/16 1234  GLUCAP 152* 188* 85 91 116*    Recent Results (from the past 240 hour(s))  MRSA PCR Screening     Status: None   Collection Time: 07/08/16  8:54 AM  Result Value Ref Range Status   MRSA by PCR NEGATIVE NEGATIVE Final    Comment:        The GeneXpert MRSA Assay (FDA approved for NASAL specimens only), is one component of a comprehensive MRSA colonization surveillance program. It is not intended to diagnose MRSA infection nor to guide or monitor treatment for MRSA infections.      Studies: No results found.  Scheduled Meds: . [MAR Hold] allopurinol  100 mg Oral Daily  . [  MAR Hold] aspirin EC  81 mg Oral QODAY  . [MAR Hold] capsaicin   Topical BID  . [MAR Hold] ceFAZolin  2 g Other On Call to OR  . [MAR Hold] finasteride  5 mg Oral Daily  . [MAR Hold] insulin aspart  0-5 Units Subcutaneous QHS  . [MAR Hold] insulin aspart  0-9 Units Subcutaneous TID WC  . [MAR Hold] metoprolol tartrate  12.5 mg Oral BID  . [MAR Hold] pravastatin  40 mg Oral q1800  . [MAR Hold] predniSONE  50 mg Oral Q breakfast  . [MAR Hold] sodium chloride flush  3 mL Intravenous Q12H    Continuous Infusions: . 0.9 % NaCl with KCl 20 mEq / L Stopped (07/08/16 1015)  . lactated ringers 10 mL/hr at 07/08/16 1039     Time spent: 62mins  Aysel Gilchrest MD, PhD  [facility] Pager [phone]. If 7PM-7AM, please contact night-coverage at [email or web address], password [facility] 07/08/2016, 12:39 PM  LOS: 5 days

## 2016-07-08 NOTE — Progress Notes (Signed)
Pt is going off the unit for surgery.

## 2016-07-08 NOTE — Progress Notes (Signed)
  Subjective: Had a couple sips this AM, no one made him NPO, broth last PM for supper.  NO pain.  I have made him NPO and we will plan to do as third case today.     Objective: Vital signs in last 24 hours: Temp:  [98.3 F (36.8 C)-98.7 F (37.1 C)] 98.3 F (36.8 C) (12/03 2117) Pulse Rate:  [56-62] 61 (12/04 0454) Resp:  [16-17] 17 (12/04 0454) BP: (125-138)/(64-76) 132/64 (12/04 0454) SpO2:  [97 %-100 %] 99 % (12/04 0454) Weight:  [90 kg (198 lb 8 oz)] 90 kg (198 lb 8 oz) (12/04 0445) Last BM Date: 07/05/16 INR 1.56 Na 132, LFT's OK, ALK phos is lower. Afebrile, VSS Urine 400 recorded  Intake/Output from previous day: 12/03 0701 - 12/04 0700 In: -  Out: 400 [Urine:400] Intake/Output this shift: No intake/output data recorded.  General appearance: alert, cooperative and no distress Resp: clear to auscultation bilaterally GI: soft, non-tender; bowel sounds normal; no masses,  no organomegaly  Lab Results:   Recent Labs  07/08/16 0546  WBC 8.8  HGB 11.8*  HCT 36.1*  PLT 155    BMET  Recent Labs  07/07/16 0510 07/08/16 0546  NA 135 132*  K 3.6 3.5  CL 102 99*  CO2 25 27  GLUCOSE 128* 103*  BUN 14 16  CREATININE 0.84 0.81  CALCIUM 8.6* 8.6*   PT/INR  Recent Labs  07/07/16 0510 07/08/16 0546  LABPROT 20.3* 18.8*  INR 1.72 1.56     Recent Labs Lab 07/04/16 0248 07/05/16 0303 07/06/16 0508 07/07/16 0510 07/08/16 0546  AST 126* 76* 50* 34 35  ALT 102* 75* 55 43 39  ALKPHOS 314* 278* 269* 248* 229*  BILITOT 2.9* 2.6* 2.4* 1.9* 1.2  PROT 6.0* 5.7* 5.9* 5.6* 6.2*  ALBUMIN 2.6* 2.4* 2.3* 2.1* 2.3*     Lipase     Component Value Date/Time   LIPASE 29 07/05/2016 0303     Studies/Results: No results found.  Medications: . allopurinol  100 mg Oral Daily  . aspirin EC  81 mg Oral QODAY  . capsaicin   Topical BID  . finasteride  5 mg Oral Daily  . furosemide  40 mg Intravenous BID  . insulin aspart  0-5 Units Subcutaneous QHS  .  insulin aspart  0-9 Units Subcutaneous TID WC  . metoprolol tartrate  12.5 mg Oral BID  . pravastatin  40 mg Oral q1800  . predniSONE  50 mg Oral Q breakfast  . sodium chloride flush  3 mL Intravenous Q12H    NO IV fluids listed   Assessment/Plan Gallstone pancreatitis/Type 1 choledochal cyst Cirrhosis - non drinker - (Liver bx? 07/07/16 note per Dr. Oletta Lamas)  PAF on coumadin CAD with MI NSTEMI/Stent 3903 Chronic Diastolic CHF/MV repair AODM   Deconditioning/FTT Gout flare FEN: Diet: clears ID: no abx DVT:  SCD added  Plan:  Surgery today, I have made him NPO and told him not to eat or drink any further.  I will start some IV fluids and we plan him as third case today.       LOS: 5 days    Olita Takeshita 07/08/2016 [phone]

## 2016-07-08 NOTE — Telephone Encounter (Signed)
Patient was schd for a cysto on the 12th but cx because he is having gall bladder surgery today and will cb later to reschd.  Sharyn Lull

## 2016-07-08 NOTE — Op Note (Signed)
07/03/2016 - 07/08/2016  12:18 PM  PATIENT:  [name]  82 y.o. male  PRE-OPERATIVE DIAGNOSIS:  BILIARY PANCREATITIS  POST-OPERATIVE DIAGNOSIS:  BILIARY PANCREATITIS  PROCEDURE:  Procedure(s): LAPAROSCOPIC CHOLECYSTECTOMY  SURGEON:  Surgeon(s): Georganna Skeans, MD  ASSISTANTS: none   ANESTHESIA:   local and general  EBL:  Total I/O In: 500 [I.V.:500] Out: 65 [Blood:50]  BLOOD ADMINISTERED:none  DRAINS: none   SPECIMEN:  Excision  DISPOSITION OF SPECIMEN:  PATHOLOGY  COUNTS:  YES  DICTATION: .Dragon Dictation Findings: Multiple small gallstones, nodular and diseased appearing liver which was very friable precluding liver biopsy.  Procedure in detail: Nayshawn presents for cholecystectomy. His INR is down to 1.5 today. Informed consent was obtained. He received intravenous antibiotics. He was brought to the operating room and general endotracheal anesthesia was administered by the anesthesia staff. His abdomen was prepped and draped in sterile fashion. We did a time out procedure.The infraumbilical region was infiltrated with local. Infraumbilical incision was made. Subcutaneous tissues were dissected down revealing the anterior fascia. This was divided sharply along the midline. Peritoneal cavity was entered under direct vision without complication. A 0 Vicryl pursestring was placed around the fascial opening. Hassan trocar was inserted into the abdomen. The abdomen was insufflated with carbon dioxide in standard fashion. Under direct vision a 5 mm epigastric and 5 mm right port 2 were placed. Local was used at each port site. Laparoscopic exploration revealed a nodular appearing liver which was friable. The gallbladder appeared to have some chronic inflammation. The dome the gallbladder was retracted superior medially. The infundibulum was retracted inferior laterally after sweeping down some filmy omental adhesions. Dissection began laterally and progressed medially first  identified the cystic artery. This was clipped twice proximally and once distally and divided. Further dissection clearly revealed the cystic duct. Dissection continued until we had a critical view between the gallbladder, the cystic duct and the liver. I stayed away from the junction of the cystic duct with the common bile duct. 3 clips were placed proximally on the cystic duct and it was divided. There was a small posterior branch of the cystic artery which was clipped proximally. Gallbladder was taken off the liver bed using cautery and placed in a bag. It was removed from the abdomen and sent to pathology. The liver bed was then cauterized. Once hemostasis looked good, a piece of Surgicel snow was also placed due to the friability of the liver. There was good hemostasis at this point. The abdomen was irrigated and irrigation fluid was evacuated. Ports were removed under direct vision. Pneumoperitoneum was released. Infraumbilical fascia was closed by tying the pursestring. All 4 wounds were copiously irrigated and the skin of each was closed with running 4 Vicryl followed by Dermabond. All counts were correct. He tolerated the procedure well without apparent complication was taken recovery in stable condition.  PATIENT DISPOSITION:  PACU - hemodynamically stable.   Delay start of Pharmacological VTE agent (>24hrs) due to surgical blood loss or risk of bleeding:  no  Georganna Skeans, MD, MPH, FACS Pager: [phone]  12/4/201712:18 PM

## 2016-07-08 NOTE — Care Management Important Message (Signed)
Important Message  Patient Details  Name: [name] MRN: [id number] Date of Birth: 04-May-1929   Medicare Important Message Given:  Yes    [name] 07/08/2016, 11:58 AM

## 2016-07-08 NOTE — Progress Notes (Signed)
ANTICOAGULATION CONSULT NOTE - Initial Consult  Pharmacy Consult for warfarin restart Indication: atrial fibrillation  No Known Allergies  Patient Measurements: Height: 5\' 9"  (175.3 cm) Weight: 198 lb 8 oz (90 kg) IBW/kg (Calculated) : 70.7  Vital Signs: Temp: 97.7 F (36.5 C) (12/04 1230) BP: 142/70 (12/04 1315) Pulse Rate: 71 (12/04 1315)  Labs:  Recent Labs  07/06/16 0508 07/07/16 0510 07/08/16 0546  HGB  --   --  11.8*  HCT  --   --  36.1*  PLT  --   --  155  LABPROT 23.3* 20.3* 18.8*  INR 2.04 1.72 1.56  CREATININE 0.92 0.84 0.81    Estimated Creatinine Clearance: 73.9 mL/min (by C-G formula based on SCr of 0.81 mg/dL).   Medical History: Past Medical History:  Diagnosis Date  . Chronic diastolic (congestive) heart failure    a. 10/2013 Echo: EF 55-60%, basal-mid anteroseptal and basal-mid inferoseptal HK.  . Chronic fatigue   . Chronic subdural hematoma ([place])    a. 12/2015 Head CT: chronic right holo-hemispheric SDH w/o midline shift.  . Coronary artery disease    a. 11/2014 NSTEMI: DESx 2 placed to LAD and RCA; b. 08/2015 Cath: LAD patent stent, LCX 35m, RCA patent stent, 7m.  . Diabetes mellitus, type II ([place])    a. HgA1c 6.8 in 03/2015  . ED (erectile dysfunction)   . Gout   . Hemangioma of liver 08/02/2015   12 mm enhancing lesion seen on CT - suspicious for benign hemangioma but not diagnostic - MRI recommended  . HLD (hyperlipidemia)   . Hypertensive heart disease   . Kidney stones   . Lower extremity edema    a. chronic  . OA (osteoarthritis)   . Orthostatic hypotension   . PAF (paroxysmal atrial fibrillation) ([place])    a. 10/2015 s/p DCCV;  b. CHA2DS2VASc = 6--> on coumadin; c. 10/2015 Echo: sev dil LA.  Marland Kitchen Physical deconditioning   . Prostate cancer ([place])   . Pulmonary HTN   . Pulmonary hypertension    a. 10/2015 Echo: PASP 19mmHg.  Marland Kitchen Severe mitral regurgitation    a.  10/2015 s/p minimally invasive MV repair; b. 10/2015 Echo: mod MS, mean grad  70mmHg.  Marland Kitchen Spinal stenosis   . SVT (supraventricular tachycardia) ([facility])    a. recurrent, usually responsive to vagal manuevers  . Tricuspid regurgitation    a. 10/2015 Echo: mild to mod TR.    Medications:  See EMR  Assessment: 31 yoM with PMH of paroxysmal atrial fibrillation on warfarin PTA. Warfarin has been on hold in preparation for procedure today. Pt now s/p lap chole and restarting warfarin tonight. INR currently subtherapeutic at 1.56. Pt was NPO today, but now on thin liquids. Last dose PTA was on 11/28.  PTA warfarin dose: 5 mg dose on Sun,Wed, Fri and 4 mg on Mon, Tues, Thurs, Sat.  Goal of Therapy:  INR 2-3 Monitor platelets by anticoagulation protocol: Yes   Plan:  Give warfarin 7 mg po x 1 Monitor daily INR, CBC, clinical course, s/sx of bleed, PO intake, DDI   Thank you for allowing [place] to participate in this patients care. Jens Som, PharmD Pager: [phone] 07/08/2016,2:04 PM

## 2016-07-08 NOTE — Anesthesia Preprocedure Evaluation (Signed)
Anesthesia Evaluation  Patient identified by MRN, date of birth, ID band Patient awake    Reviewed: Allergy & Precautions, NPO status , Patient's Chart, lab work & pertinent test results  History of Anesthesia Complications Negative for: history of anesthetic complications  Airway Mallampati: II  TM Distance: >3 FB Neck ROM: Full    Dental  (+) Edentulous Upper, Edentulous Lower   Pulmonary neg shortness of breath, neg sleep apnea, neg COPD, neg recent URI, former smoker,    breath sounds clear to auscultation       Cardiovascular hypertension, Pt. on medications + CAD and +CHF   Rhythm:Regular     Neuro/Psych negative neurological ROS  negative psych ROS   GI/Hepatic Gallstone pancreatitis   Endo/Other  diabetes  Renal/GU Renal disease     Musculoskeletal  (+) Arthritis ,   Abdominal   Peds  Hematology  (+) anemia ,   Anesthesia Other Findings   Reproductive/Obstetrics                             Anesthesia Physical Anesthesia Plan  ASA: III  Anesthesia Plan: General   Post-op Pain Management:    Induction: Intravenous  Airway Management Planned: Oral ETT  Additional Equipment: None  Intra-op Plan:   Post-operative Plan: Extubation in OR  Informed Consent: I have reviewed the patients History and Physical, chart, labs and discussed the procedure including the risks, benefits and alternatives for the proposed anesthesia with the patient or authorized representative who has indicated his/her understanding and acceptance.   Dental advisory given  Plan Discussed with: CRNA and Surgeon  Anesthesia Plan Comments:         Anesthesia Quick Evaluation

## 2016-07-08 NOTE — Progress Notes (Signed)
CSW received consult regarding PT recommendation of SNF at discharge.  Patient is refusing SNF. He states that he has been to one in the past and he did not like it all. He would like home health services.   CSW signing off.   Percell Locus Oday Ridings LCSWA [phone]

## 2016-07-08 NOTE — Anesthesia Procedure Notes (Addendum)
Procedure Name: Intubation Date/Time: 07/08/2016 11:28 AM Performed by: Myna Bright Pre-anesthesia Checklist: Patient identified, Emergency Drugs available, Suction available and Patient being monitored Patient Re-evaluated:Patient Re-evaluated prior to inductionOxygen Delivery Method: Circle System Utilized Preoxygenation: Pre-oxygenation with 100% oxygen Intubation Type: IV induction Ventilation: Mask ventilation without difficulty Laryngoscope Size: Mac and 4 Grade View: Grade I Tube type: Oral Tube size: 7.5 mm Number of attempts: 1 Airway Equipment and Method: Stylet and Oral airway Placement Confirmation: ETT inserted through vocal cords under direct vision,  positive ETCO2 and breath sounds checked- equal and bilateral Secured at: 23 cm Tube secured with: Tape Dental Injury: Teeth and Oropharynx as per pre-operative assessment

## 2016-07-08 NOTE — Transfer of Care (Signed)
Immediate Anesthesia Transfer of Care Note  Patient: [name]  Procedure(s) Performed: Procedure(s): LAPAROSCOPIC CHOLECYSTECTOMY WITH INTRAOPERATIVE CHOLANGIOGRAM (N/A)  Patient Location: PACU  Anesthesia Type:General  Level of Consciousness: awake, alert , oriented and patient cooperative  Airway & Oxygen Therapy: Patient Spontanous Breathing and Patient connected to nasal cannula oxygen  Post-op Assessment: Report given to RN, Post -op Vital signs reviewed and stable and Patient moving all extremities  Post vital signs: Reviewed and stable  Last Vitals:  Vitals:   07/08/16 1041 07/08/16 1230  BP:    Pulse: (!) 55   Resp: 18   Temp:  36.5 C    Last Pain:  Vitals:   07/08/16 1230  TempSrc:   PainSc: 0-No pain      Patients Stated Pain Goal: 3 ([id number])  Complications: No apparent anesthesia complications

## 2016-07-08 NOTE — Progress Notes (Signed)
SLP Cancellation Note  Patient Details Name: ALDWIN ZICKEFOOSE MRN: [id number] DOB: August 03, 1929   Cancelled treatment:       Reason Eval/Treat Not Completed: Patient at procedure or test/unavailable. NPO for procedure. Will f/u tomorrow    Lynann Beaver 07/08/2016, 10:25 AM

## 2016-07-09 ENCOUNTER — Encounter (HOSPITAL_COMMUNITY): Payer: Self-pay | Admitting: General Surgery

## 2016-07-09 DIAGNOSIS — Z23 Encounter for immunization: Secondary | ICD-10-CM | POA: Diagnosis not present

## 2016-07-09 LAB — COMPREHENSIVE METABOLIC PANEL
ALK PHOS: 218 U/L — AB (ref 38–126)
ALT: 43 U/L (ref 17–63)
ANION GAP: 7 (ref 5–15)
AST: 57 U/L — AB (ref 15–41)
Albumin: 2.3 g/dL — ABNORMAL LOW (ref 3.5–5.0)
BILIRUBIN TOTAL: 0.9 mg/dL (ref 0.3–1.2)
BUN: 18 mg/dL (ref 6–20)
CALCIUM: 8.7 mg/dL — AB (ref 8.9–10.3)
CO2: 28 mmol/L (ref 22–32)
Chloride: 97 mmol/L — ABNORMAL LOW (ref 101–111)
Creatinine, Ser: 0.96 mg/dL (ref 0.61–1.24)
GFR calc Af Amer: >60 mL/min (ref >60)
GLUCOSE: 140 mg/dL — AB (ref 65–99)
POTASSIUM: 3.8 mmol/L (ref 3.5–5.1)
Sodium: 132 mmol/L — ABNORMAL LOW (ref 135–145)
TOTAL PROTEIN: 6 g/dL — AB (ref 6.5–8.1)

## 2016-07-09 LAB — CBC
HEMATOCRIT: 38.1 % — AB (ref 39.0–52.0)
HEMOGLOBIN: 12.5 g/dL — AB (ref 13.0–17.0)
MCH: 30.4 pg (ref 26.0–34.0)
MCHC: 32.8 g/dL (ref 30.0–36.0)
MCV: 92.7 fL (ref 78.0–100.0)
Platelets: 164 10*3/uL (ref 150–400)
RBC: 4.11 MIL/uL — ABNORMAL LOW (ref 4.22–5.81)
RDW: 14.9 % (ref 11.5–15.5)
WBC: 10.6 10*3/uL — ABNORMAL HIGH (ref 4.0–10.5)

## 2016-07-09 LAB — GLUCOSE, CAPILLARY
GLUCOSE-CAPILLARY: 124 mg/dL — AB (ref 65–99)
Glucose-Capillary: 186 mg/dL — ABNORMAL HIGH (ref 65–99)

## 2016-07-09 LAB — PROTIME-INR
INR: 1.56
Prothrombin Time: 18.8 seconds — ABNORMAL HIGH (ref 11.4–15.2)

## 2016-07-09 LAB — MAGNESIUM: MAGNESIUM: 1.9 mg/dL (ref 1.7–2.4)

## 2016-07-09 MED ORDER — MAGNESIUM SULFATE IN D5W 1-5 GM/100ML-% IV SOLN
1.0000 g | Freq: Once | INTRAVENOUS | Status: AC
  Administered 2016-07-09: 1 g via INTRAVENOUS
  Filled 2016-07-09: qty 100

## 2016-07-09 MED ORDER — POTASSIUM CHLORIDE 20 MEQ/15ML (10%) PO SOLN
40.0000 meq | Freq: Once | ORAL | Status: AC
  Administered 2016-07-09: 40 meq via ORAL
  Filled 2016-07-09: qty 30

## 2016-07-09 MED ORDER — MAGNESIUM OXIDE 400 MG PO TABS: 400.0000 mg | ORAL_TABLET | Freq: Every day | ORAL | 0 refills | Status: DC

## 2016-07-09 MED ORDER — POTASSIUM CHLORIDE 20 MEQ PO PACK: 20.0000 meq | PACK | Freq: Every day | ORAL | 0 refills | Status: DC

## 2016-07-09 MED ORDER — FUROSEMIDE 40 MG PO TABS: 40.0000 mg | ORAL_TABLET | Freq: Two times a day (BID) | ORAL | 3 refills | Status: DC

## 2016-07-09 MED ORDER — ALLOPURINOL 100 MG PO TABS: 100.0000 mg | ORAL_TABLET | Freq: Every day | ORAL | 0 refills | Status: DC

## 2016-07-09 MED ORDER — COLCHICINE 0.6 MG PO TABS: 0.6000 mg | ORAL_TABLET | Freq: Every day | ORAL | 0 refills | Status: DC

## 2016-07-09 MED ORDER — POTASSIUM CHLORIDE CRYS ER 20 MEQ PO TBCR: 40.0000 meq | EXTENDED_RELEASE_TABLET | Freq: Once | ORAL | Status: DC

## 2016-07-09 MED ORDER — MORPHINE SULFATE (PF) 2 MG/ML IV SOLN: 1.0000 mg | Freq: Four times a day (QID) | INTRAVENOUS | Status: DC | PRN

## 2016-07-09 MED ORDER — WARFARIN SODIUM 5 MG PO TABS: 5.0000 mg | ORAL_TABLET | Freq: Once | ORAL | Status: DC

## 2016-07-09 NOTE — Progress Notes (Signed)
Nsg Discharge Note  Admit Date:  07/03/2016 Discharge date: 07/09/2016   [name] to be D/C'd home per MD order.  AVS completed.  Copy for chart, and copy for patient signed, and dated. Patient/caregiver able to verbalize understanding.  Discharge Medication:   Medication List    STOP taking these medications   acetaminophen 500 MG tablet Commonly known as:  TYLENOL   ondansetron 4 MG tablet Commonly known as:  ZOFRAN   potassium chloride SA 20 MEQ tablet Commonly known as:  K-DUR,KLOR-CON   traMADol 50 MG tablet Commonly known as:  ULTRAM     TAKE these medications   allopurinol 100 MG tablet Commonly known as:  ZYLOPRIM Take 1 tablet (100 mg total) by mouth daily. Start taking on:  07/10/2016   aspirin EC 81 MG tablet Take 81 mg by mouth every other day. Reported on 12/29/2015   BENEFIBER Powd Take by mouth See admin instructions. Mix one teaspoonful into 6-8 ounces of fluid/milk and drink once daily   colchicine 0.6 MG tablet Take 1 tablet (0.6 mg total) by mouth daily.   finasteride 5 MG tablet Commonly known as:  PROSCAR TAKE ONE (1) TABLET EACH DAY   furosemide 40 MG tablet Commonly known as:  LASIX Take 1 tablet (40 mg total) by mouth 2 (two) times daily. What changed:  how much to take   magnesium oxide 400 MG tablet Commonly known as:  MAG-OX Take 1 tablet (400 mg total) by mouth daily.   metoprolol tartrate 25 MG tablet Commonly known as:  LOPRESSOR Take 0.5 tablets (12.5 mg total) by mouth 2 (two) times daily.   OVER THE COUNTER MEDICATION Neuroquell: Take 1 capsule by mouth once a day for immune health   potassium chloride 20 MEQ packet Commonly known as:  KLOR-CON Take 20 mEq by mouth daily.   pravastatin 40 MG tablet Commonly known as:  PRAVACHOL Take 1 tablet (40 mg total) by mouth daily at 6 PM.   warfarin 2 MG tablet Commonly known as:  COUMADIN Take 1-2 tablets daily or as directed by coumadin clinic What changed:  how  much to take  how to take this  when to take this  additional instructions       Discharge Assessment: Vitals:   07/09/16 1340 07/09/16 1557  BP:  119/75  Pulse: (!) 114 75  Resp:  18  Temp:     Skin clean, dry and intact without evidence of skin break down, no evidence of skin tears noted. IV catheter discontinued intact. Site without signs and symptoms of complications - no redness or edema noted at insertion site, patient denies c/o pain - only slight tenderness at site.  Dressing with slight pressure applied.  D/c Instructions-Education: Discharge instructions given to patient/family with verbalized understanding. D/c education completed with patient/family including follow up instructions, medication list, d/c activities limitations if indicated, with other d/c instructions as indicated by MD - patient able to verbalize understanding, all questions fully answered. Patient instructed to return to ED, call 911, or call MD for any changes in condition.  Patient's wife verified patient has all belongings including dentures and cell phone. Patient escorted via [place], and D/C home via private auto.  Wyonia Hough, RN 07/09/2016 4:15 PM

## 2016-07-09 NOTE — Progress Notes (Signed)
Physical Therapy Treatment Patient Details Name: [name] MRN: [id number] DOB: 12-14-1928 Today's Date: 07/09/2016    History of Present Illness pt presents with Pancreatitis.  pt with hx of CAD, MVR, HTN, A-fib, DM, SDH, and HF.      PT Comments    The pt has met all of his goals and tolerated gait training today without any LOB. Pt was also able to tolerate strengthening exercises sitting on EOB.  Pt will benefit from HHPT to increase his functional independence.  Will notify supervising PT that pt is safe for discharge.  Follow Up Recommendations  Home health PT     Equipment Recommendations  None recommended by PT (Per pt he has all the equipment he needs at home.)    Recommendations for Other Services       Precautions / Restrictions Precautions Precautions: Fall Restrictions Weight Bearing Restrictions: No    Mobility  Bed Mobility Overal bed mobility: Modified Independent Bed Mobility: Supine to Sit     Supine to sit: Modified independent (Device/Increase time)     General bed mobility comments: Increased time but no assistance needed.  Transfers Overall transfer level: Needs assistance Equipment used: Rolling walker (2 wheeled) Transfers: Sit to/from Stand Sit to Stand: Min guard         General transfer comment: Min guard for safety.  Verbal cues for hand placement.  Ambulation/Gait Ambulation/Gait assistance: Min guard Ambulation Distance (Feet): 30 Feet Assistive device: Rolling walker (2 wheeled) Gait Pattern/deviations: Step-through pattern;Decreased stride length;Trunk flexed     General Gait Details: Verbal cues for upright posture, but pt stated that he cannot stand upright due to HX of back surgeries.  Min guard for safety.   Stairs            Wheelchair Mobility    Modified Rankin (Stroke Patients Only)       Balance     Sitting balance-Leahy Scale: Fair                              Cognition  Arousal/Alertness: Awake/alert Behavior During Therapy: WFL for tasks assessed/performed Overall Cognitive Status: Within Functional Limits for tasks assessed                      Exercises Total Joint Exercises Ankle Circles/Pumps: AROM;Both;5 reps;Seated Long Arc Quad: AROM;Both;15 reps;Seated Marching in Standing: AROM;Both;10 reps;Seated General Exercises - Lower Extremity Toe Raises: AROM;Both;10 reps;Seated Heel Raises: AROM;Both;10 reps;Seated    General Comments        Pertinent Vitals/Pain Pain Assessment: Faces Faces Pain Scale: Hurts even more Pain Location: R foot Pain Descriptors / Indicators: Throbbing Pain Intervention(s): Limited activity within patient's tolerance;Monitored during session    Home Living                      Prior Function            PT Goals (current goals can now be found in the care plan section) Acute Rehab PT Goals Patient Stated Goal: No more pain PT Goal Formulation: With patient Time For Goal Achievement: 07/20/16 Potential to Achieve Goals: Good Progress towards PT goals: Goals met/education completed, patient discharged from PT    Frequency    Min 3X/week      PT Plan Discharge plan needs to be updated    Co-evaluation  End of Session Equipment Utilized During Treatment: Gait belt Activity Tolerance: Patient tolerated treatment well Patient left: with nursing/sitter in room;with family/visitor present;Other (comment) (Sitting on EOB getting ready to be discharged.)     Time: 1532-1549 PT Time Calculation (min) (ACUTE ONLY): 17 min  Charges:  $Therapeutic Activity: 8-22 mins                    G Codes:      [name] 07/09/2016, 5:13 PM  [name], SPTA [phone] 

## 2016-07-09 NOTE — Progress Notes (Signed)
Speech Language Pathology Treatment: Dysphagia  Patient Details Name: [name] MRN: [id number] DOB: 01/15/1929 Today'Fred Date: 07/09/2016 Time: 0840-0900 SLP Time Calculation (min) (ACUTE ONLY): 20 min  Assessment / Plan / Recommendation Clinical Impression  Pt now on upgraded diet, tolerating well with no overt signs of aspiration with am meal. Pt continues to reports difficulty swallowing large pills, continue to crush. No SLP f/u needed, will sign off.    HPI HPI: Fred Talleyis a 82 y.o.malewith medical history significant of gallstone, diet-controlled diabetes, hyperlipidemia, prostate cancer, PAF on Coumadin, CAD, dCHF, tricuspid valve regurgitation, atrial valve regurgitation (Fred/p of repair), pulmonary hypertension, gout, who presents with abdominal pain.   Patient states that he started having abdominal pain since Thanksgiving day. It is located in the right upper quadrant, constant, 6 out of 10 in severity, sharp, nonradiating. It is associated with nausea, but not vomiting or diarrhea. Patient does not have fever or chills. Patient does not have chest pain, shortness of breath, cough, symptoms of UTI or unilateral weakness.  He was ultimately diagnosed with gallstone pancreatitis and is currently on a clear liquid diet.  Of note the patient had MBS dated 10/30/15 with recommendation for a dysphagia 3 diet with nectar thick liquids.  The patient reported that he has not really thickened liquids at home.  He reported no recent respiratory issues.        SLP Plan  All goals met     Recommendations  Diet recommendations: Regular;Thin liquid Liquids provided via: Cup;Straw Medication Administration: Crushed with puree Supervision: Patient able to self feed                Plan: All goals met       GO               [name], MA CCC-SLP [phone]  Lynann Beaver 07/09/2016, 9:56 AM

## 2016-07-09 NOTE — Progress Notes (Signed)
Writer notified by telemetry that patient had a 21 beat run of vtach. Patient asymptomatic and resting. Dr. Erlinda Hong notified via page. Will continue to monitor. Wyonia Hough 12:59 PM

## 2016-07-09 NOTE — Discharge Summary (Signed)
Discharge Summary  [name] [id number] DOB: [birth year]  PCP: [name], MD  Admit date: 07/03/2016 Discharge date: 07/09/2016  Time spent: >63mins  Recommendations for Outpatient Follow-up:  1. F/u with PMD within a week  for hospital discharge follow up, repeat cbc/bmp at follow up 2. F/u with [facility] for cirrhosis 3. F/u with general surgery, s/p lap chole 4. F/u with cardiology for afib/NSVT/chf (Dr hochrein) 5. F/u with neurology for chronic subdural hematoma , slurred speech 6. Home health RN to check INR and report result to coumadin clinic for coumadin dose adjustment  Discharge Diagnoses:  Active Hospital Problems   Diagnosis Date Noted  . Gallstone pancreatitis 07/03/2016  . Chronic diastolic CHF (congestive heart failure) ([place]) 07/03/2016  . PAF (paroxysmal atrial fibrillation) ([place])   . CAD (coronary artery disease)   . Diabetes mellitus, type II ([place])   . Pulmonary HTN   . Dyslipidemia 11/15/2014    Resolved Hospital Problems   Diagnosis Date Noted Date Resolved  No resolved problems to display.    Discharge Condition: stable  Diet recommendation: heart healthy/carb modified, crush pills  Filed Weights   07/06/16 0611 07/07/16 0616 07/08/16 0445  Weight: 90.3 kg (199 lb) 90.6 kg (199 lb 11.2 oz) 90 kg (198 lb 8 oz)    History of present illness:  PCP: [name], MD   Patient coming from:  The patient is coming from home.  At baseline, pt is independent for most of ADL.   Chief Complaint: Abdominal pain  HPI: [name] is a 82 y.o. male with medical history significant of gallstone, diet-controlled diabetes, hyperlipidemia, prostate cancer, PAF on Coumadin, CAD, dCHF, tricuspid valve regurgitation, atrial valve regurgitation (s/p of repair), pulmonary hypertension, gout, who presents with abdominal pain.  Patient states that he started having abdominal pain since Thanksgiving day. It is located in the right upper  quadrant, constant, 6 out of 10 in severity, sharp, nonradiating. It is associated with nausea, but not vomiting or diarrhea. Patient does not have fever or chills. Patient does not have chest pain, shortness of breath, cough, symptoms of UTI or unilateral weakness.  ED Course: pt was found to have lipase is 1675, INR 2.59, WBC 7.8, creatinine 1.01, temperature normal, no tachycardia, oxygen saturation 96% on room air, pending urinalysis. Abdominal ultrasound showed cholelithiasis without signs of cholecystitis. No dilation of common bile duct. Pt is admitted to telemetry bed as inpatient.  Hospital Course:  Principal Problem:   Gallstone pancreatitis Active Problems:   Dyslipidemia   Diabetes mellitus, type II ([place])   Pulmonary HTN   PAF (paroxysmal atrial fibrillation) ([facility])   CAD (coronary artery disease)   Chronic diastolic CHF (congestive heart failure) ([place])   Gallstone pancreatitis:  Patient presented on 11/29  with abdominal pain, n/v, lft elevated and Lipase elevated at 1675 on admission.  US-abd showed cholelithiasis without signs of cholecystitis. No dilation of common bile duct. No fever or leukocytosis, less likely to have cholecystitis or ascending cholangitis. - -MRCP on 11/30 showed pancreatitis, pancreatic cyst, cirrhosis, gallstone, no CBD stone, . --his GI symptom has resolved. lft trending down, lipase normalized on 11/30 -  GI recommended liver biopsy recommended during surgery which was not able to be done due to "nodular and diseased appearing liver which was very friable precluding liver biopsy." -S/p LAPAROSCOPIC CHOLECYSTECTOMY on 12/4, diet advancement per GI/general surgery. Coumadin resumed post op.   Cirrhosis: new diagnosis, identified by MRCP from chronic chf?  Hepatitis panel negative,  no history of alcohol use lft trending down liver biopsy not done, see note above [facility]  Hypokalemia: replace k, patient is not able to swallow  potassium pills, potassium solution/powder prescribed.  Paroxysmal Atrial Fibrillation: CHA2DS2-VASc Scoreis 4,  Patient is on Coumadin at home. INR is 2.59on admission. Sinus rhythm since admission, Heart rate is well controlled. -continue metoprolol, keep k>4, mag >2 -Coumadin held on admission, resumed on 12/4 post op -cardiology consulted   CAD: with h/o NSTEMI, s/p stent in 2016 - no CP. -Continue aspirin, metoprolol, statin  Chronic diastolic CHF (congestive heart failure), h/o MV repair in 10/2015 2-D echo 10/18/15 showed EF of 55-60 percent. Patient is onhigh-dose Lasix 80 mg twice a day currently. Patient has trace amount of leg edema, no JVD. No shortness of breath. CHF is compensated on admission. -Continue metoprolol and aspirin -Lasix held on admission and patient received IV fluid for pancreatitis -BNP 617 on admission -+ pleural effusion identified on mrcp, patient is on room air, avoid ivf, restart lasix bid  - cardiology consulted  [id number] LDL was 80 on 06/05/15 -Continue home medications: Pravastatin  Diet controlled DM-II:Last A1c 5.75/5/17, well controled. Patient isnottaking medication at home -SSI  H/o chronic subdural hematoma, stable, no headache    Gout flare: Bilateral foot , worse on the right, foot x ray consistent with gout flare.  uric acid elevated at 7.7 Treated with prednisone, colchicine, allopurinol, prn toradol, topical capsaicin, pain resolved at time of discharge.  Deconditioning/FTT: dysphagia, speech/PT, dysphagia diet, aspiration precaution, PT recommended SNF placement, but patient want to go home, home health arranged  DVT ppx: SCD/coumadin Code Status:Full code Family Communication: patient and multiple family members in room  Disposition Plan: home with home health on 12/5 with general surgery clearance  Consults called: [facility] General surgery   Procedures:  MRCP  LAPAROSCOPIC  CHOLECYSTECTOMY on 12/4  Antibiotics:  Perioperative ancef   Discharge Exam: BP 128/67   Pulse (!) 114   Temp 97.9 F (36.6 C) (Oral)   Resp 20   Ht 5\' 9"  (1.753 m)   Wt 90 kg (198 lb 8 oz)   SpO2 94%   BMI 29.31 kg/m     General:  NAD, poor historian, aaox3, chronic slurred speech  Cardiovascular: RRR, + mumur  Respiratory: diminished bilateral lower lung fields, no rales, no rhonchi, no wheezing  Abdomen:  positive BS, post op changes, nontender  Musculoskeletal: No Edema, bilateral foot tenderness has much improved   Neuro: aaox3   Discharge Instructions You were cared for by a hospitalist during your hospital stay. If you have any questions about your discharge medications or the care you received while you were in the hospital after you are discharged, you can call the unit and asked to speak with the hospitalist on call if the hospitalist that took care of you is not available. Once you are discharged, your primary care physician will handle any further medical issues. Please note that NO REFILLS for any discharge medications will be authorized once you are discharged, as it is imperative that you return to your primary care physician (or establish a relationship with a primary care physician if you do not have one) for your aftercare needs so that they can reassess your need for medications and monitor your lab values.  Discharge Instructions    Diet - low sodium heart healthy    Complete by:  As directed    Carb modified, crush pills.  Face-to-face encounter (required for Medicare/Medicaid patients)    Complete by:  As directed    I Adriane Gabbert certify that this patient is under my care and that I, or a nurse practitioner or physician's assistant working with me, had a face-to-face encounter that meets the physician face-to-face encounter requirements with this patient on 07/08/2016. The encounter with the patient was in whole, or in part for the following medical  condition(s) which is the primary reason for home health care (List medical condition): FTT  RN to check INR every three days, start from 12/6, report INR result to patient's coumadin clinic for coumadin dose adjustment.   The encounter with the patient was in whole, or in part, for the following medical condition, which is the primary reason for home health care:  FTT   I certify that, based on my findings, the following services are medically necessary home health services:   Nursing Physical therapy Speech language pathology     Reason for Medically Necessary Home Health Services:  Skilled Nursing- Change/Decline in Patient Status   My clinical findings support the need for the above services:  Pain interferes with ambulation/mobility   Further, I certify that my clinical findings support that this patient is homebound due to:  Shortness of Breath with activity   Home Health    Complete by:  As directed    To provide the following care/treatments:   PT SLP RN     Increase activity slowly    Complete by:  As directed        Medication List    STOP taking these medications   acetaminophen 500 MG tablet Commonly known as:  TYLENOL   ondansetron 4 MG tablet Commonly known as:  ZOFRAN   potassium chloride SA 20 MEQ tablet Commonly known as:  K-DUR,KLOR-CON   traMADol 50 MG tablet Commonly known as:  ULTRAM     TAKE these medications   allopurinol 100 MG tablet Commonly known as:  ZYLOPRIM Take 1 tablet (100 mg total) by mouth daily. Start taking on:  07/10/2016   aspirin EC 81 MG tablet Take 81 mg by mouth every other day. Reported on 12/29/2015   BENEFIBER Powd Take by mouth See admin instructions. Mix one teaspoonful into 6-8 ounces of fluid/milk and drink once daily   finasteride 5 MG tablet Commonly known as:  PROSCAR TAKE ONE (1) TABLET EACH DAY   furosemide 40 MG tablet Commonly known as:  LASIX Take 1 tablet (40 mg total) by mouth 2 (two) times daily. What  changed:  how much to take   magnesium oxide 400 MG tablet Commonly known as:  MAG-OX Take 1 tablet (400 mg total) by mouth daily.   metoprolol tartrate 25 MG tablet Commonly known as:  LOPRESSOR Take 0.5 tablets (12.5 mg total) by mouth 2 (two) times daily.   OVER THE COUNTER MEDICATION Neuroquell: Take 1 capsule by mouth once a day for immune health   potassium chloride 20 MEQ packet Commonly known as:  KLOR-CON Take 20 mEq by mouth daily.   pravastatin 40 MG tablet Commonly known as:  PRAVACHOL Take 1 tablet (40 mg total) by mouth daily at 6 PM.   warfarin 2 MG tablet Commonly known as:  COUMADIN Take 1-2 tablets daily or as directed by coumadin clinic What changed:  how much to take  how to take this  when to take this  additional instructions      No Known Allergies Follow-up Information  [facility] Follow up.   Why:  For home health services after discahrge Contact information: [address] [place] [address] [phone]        [facility], [place] Follow up.   Specialty:  General Surgery Why:  Your appointment is 07/30/2016 at 2pm. Please arrive 30 minutes prior to your appointment to fill out necessary paperwork. Contact information: [address] [place] Makoti Magnolia, MD Follow up in 2 week(s).   Specialty:  Cardiology Why:  for afib/NSVT/chf, to discuss the use of blood thinner with h/o of chronic subdural hematoma in head. Contact information: [place] [address] [phone]        [place], MD Follow up in 1 week(s).   Specialty:  Family Medicine Why:  hospital discharge follow up Contact information: [address] [place] [address] [phone]        follow up with neurology for chronic subdural hematoma,for slurred speech Follow up in 1 month(s).         EDWARDS JR,JAMES L, MD Follow up in 1 month(s).   Specialty:  Gastroenterology Why:  liver cirrhosis Contact information: [address] [place] [address] [phone]            The results of significant diagnostics from this hospitalization (including imaging, microbiology, ancillary and laboratory) are listed below for reference.    Significant Diagnostic Studies: Mr 3d Recon At Scanner  Result Date: 07/05/2016 CLINICAL DATA:  Acute onset of generalized abdominal pain and nausea. EXAM: MRI ABDOMEN WITHOUT AND WITH CONTRAST (INCLUDING MRCP) TECHNIQUE: Multiplanar multisequence MR imaging of the abdomen was performed both before and after the administration of intravenous contrast. Heavily T2-weighted images of the biliary and pancreatic ducts were obtained, and three-dimensional MRCP images were rendered by post processing. CONTRAST:  15mL MULTIHANCE GADOBENATE DIMEGLUMINE 529 MG/ML IV SOLN COMPARISON:  None. FINDINGS: Lower chest: There is cyst small to moderate right pleural effusion and a small left effusion. The heart size appears enlarged. Hepatobiliary: The liver appears cirrhotic. The contour the liver is diffusely irregular. There are no arterial phase enhancing structures within the liver to suggest [place]. Small stones are noted layering within the dependent portion of the gallbladder, image 31 of series 5. Cystic dilatation of the mid common bile duct measures up to 1.7 cm, image 17 of series 5. Compatible with type 1 choledochal cyst. The remainder of the common bile duct has a normal caliber. No choledocholithiasis identified. Pancreas: Mild diffuse pancreatic edema. A small amount of fluid is identified surrounding the tail of pancreas where there are several cystic structures measuring up to 1 cm, image 24 of series 5. A few smaller T2 hyperintense structures are identified within the head and body of the pancreas. No enhancing mass. No pancreatic duct dilatation  identified. Spleen:  The spleen is normal. Adrenals/Urinary Tract: The adrenal glands are normal. Bilateral renal cysts are noted. There is a hemorrhagic cyst arising from the posterior cortex of the left kidney which measures 1.9 cm, image 34 of series 5. No mass or hydronephrosis. Stomach/Bowel: Visualized portions within the abdomen are unremarkable. Vascular/Lymphatic: No pathologically enlarged lymph nodes identified. No abdominal aortic aneurysm demonstrated. Other:  None. Musculoskeletal: No suspicious bone lesions identified. IMPRESSION: 1. Morphologic features a liver compatible with cirrhosis 2. Type 1 choledochal cyst. 3. Gallstones.  No choledocholithiasis identified at  this time. 4. Mild changes of pancreatitis. Multiple cystic structures are associated with the pancreas and in a patient with a history of pancreatitis these are favored to represent multiple pseudo cysts. These measure up to 1 cm. Followup imaging at 6 months with repeat MR of the pancreas is recommended to ensure stability and/or resolution of these abnormalities. 5. Bilateral Bosniak category 1 and 2 kidney cysts. 6. Cardiac enlargement and bilateral pleural effusions, right greater than left. Electronically Signed   By: Kerby Moors M.D.   On: 07/05/2016 10:32   Dg Foot 2 Views Right  Result Date: 07/05/2016 CLINICAL DATA:  Right foot pain with inability to weight bear EXAM: RIGHT FOOT - 2 VIEW COMPARISON:  None. 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 The bones are slightly demineralized. Prominent plantar and dorsal calcaneal enthesophytes. There is mild diffuse soft tissue swelling of the foot. 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 No acute fracture. 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 Calcaneal enthesophytes.  Electronically Signed   By: Ashley Royalty M.D.   On: 07/05/2016 21:51   Mr Abdomen Mrcp Moise Boring Contast  Result Date: 07/05/2016 CLINICAL DATA:  Acute onset of generalized abdominal pain and nausea. EXAM: MRI ABDOMEN WITHOUT AND WITH CONTRAST (INCLUDING MRCP) TECHNIQUE: Multiplanar multisequence MR imaging of the abdomen was performed both before and after the administration of intravenous contrast. Heavily T2-weighted images of the biliary and pancreatic ducts were obtained, and three-dimensional MRCP images were rendered by post processing. CONTRAST:  21mL MULTIHANCE GADOBENATE DIMEGLUMINE 529 MG/ML IV SOLN COMPARISON:  None. FINDINGS: Lower chest: There is cyst small to moderate right pleural effusion and a small left effusion. The heart size appears enlarged. Hepatobiliary: The liver appears cirrhotic. The contour the liver is diffusely irregular. There are no arterial phase enhancing structures within the liver to suggest [place]. Small stones are noted layering within the dependent portion of the gallbladder, image 31 of series 5. Cystic dilatation of the mid common bile duct measures up to 1.7 cm, image 17 of series 5. Compatible with type 1 choledochal cyst. The remainder of the common bile duct has a normal caliber. No choledocholithiasis identified. Pancreas: Mild diffuse pancreatic edema. A small amount of fluid is identified surrounding the tail of pancreas where there are several cystic structures measuring up to 1 cm, image 24 of series 5. A few smaller T2 hyperintense structures are identified within the head and body of the pancreas. No enhancing mass. No pancreatic duct dilatation identified. Spleen:  The spleen is normal. Adrenals/Urinary Tract: The adrenal glands are normal. Bilateral renal cysts are noted. There is a hemorrhagic cyst arising from the posterior cortex of the left kidney which measures 1.9 cm, image 34 of series 5. No mass or hydronephrosis. Stomach/Bowel: Visualized portions within the  abdomen are unremarkable. Vascular/Lymphatic: No pathologically enlarged lymph nodes identified. No abdominal aortic aneurysm demonstrated. Other:  None. Musculoskeletal: No suspicious bone lesions identified. IMPRESSION: 1. Morphologic features a liver compatible with cirrhosis 2. Type 1 choledochal cyst. 3. Gallstones.  No choledocholithiasis identified at this time. 4. Mild changes of pancreatitis. Multiple cystic structures are associated with the pancreas and in a patient with a history of pancreatitis these are favored to represent multiple pseudo cysts. These measure up to 1 cm. Followup imaging at 6 months with repeat MR of the pancreas  is recommended to ensure stability and/or resolution of these abnormalities. 5. Bilateral Bosniak category 1 and 2 kidney cysts. 6. Cardiac enlargement and bilateral pleural effusions, right greater than left. Electronically Signed   By: Kerby Moors M.D.   On: 07/05/2016 10:32   US Abdomen Limited Ruq  Result Date: 07/03/2016 CLINICAL DATA:  Acute onset of generalized abdominal pain, nausea and vomiting. Initial encounter. EXAM: US ABDOMEN LIMITED - RIGHT UPPER QUADRANT COMPARISON:  Abdominal ultrasound performed 10/18/2015, and CTA of the abdomen and pelvis performed 08/02/2015 FINDINGS: Gallbladder: Stones are noted dependently within the gallbladder. Gallbladder wall thickening is noted, raising question for chronic inflammation. No pericholecystic fluid is seen. No ultrasonographic Murphy's sign is elicited. Common bile duct: Diameter: 0.5 cm, within normal limits in caliber. Liver: No focal lesion identified. The mildly nodular contour of the liver raises concern for hepatic cirrhosis. Mildly increased parenchymal echogenicity may reflect fatty infiltration. IMPRESSION: 1. No acute abnormality seen at the right upper quadrant. 2. Cholelithiasis. Gallbladder wall thickening raises question for chronic inflammation. No evidence for obstruction or acute  cholecystitis. 3. Findings suggest hepatic cirrhosis. Mild fatty infiltration within the liver. Electronically Signed   By: Garald Balding M.D.   On: 07/03/2016 03:03    Microbiology: Recent Results (from the past 240 hour(s))  MRSA PCR Screening     Status: None   Collection Time: 07/08/16  8:54 AM  Result Value Ref Range Status   MRSA by PCR NEGATIVE NEGATIVE Final    Comment:        The GeneXpert MRSA Assay (FDA approved for NASAL specimens only), is one component of a comprehensive MRSA colonization surveillance program. It is not intended to diagnose MRSA infection nor to guide or monitor treatment for MRSA infections.      Labs: Basic Metabolic Panel:  Recent Labs Lab 07/04/16 0248 07/05/16 0303 07/06/16 0508 07/07/16 0510 07/08/16 0546 07/09/16 0546  NA 139 137 138 135 132* 132*  K 3.2* 3.8 3.4* 3.6 3.5 3.8  CL 106 105 107 102 99* 97*  CO2 26 24 25 25 27 28   GLUCOSE 116* 115* 116* 128* 103* 140*  BUN 12 10 9 14 16 18   CREATININE 1.01 0.93 0.92 0.84 0.81 0.96  CALCIUM 8.8* 8.9 8.7* 8.6* 8.6* 8.7*  MG 2.1 1.9  --  2.0 1.9 1.9   Liver Function Tests:  Recent Labs Lab 07/05/16 0303 07/06/16 0508 07/07/16 0510 07/08/16 0546 07/09/16 0546  AST 76* 50* 34 35 57*  ALT 75* 55 43 39 43  ALKPHOS 278* 269* 248* 229* 218*  BILITOT 2.6* 2.4* 1.9* 1.2 0.9  PROT 5.7* 5.9* 5.6* 6.2* 6.0*  ALBUMIN 2.4* 2.3* 2.1* 2.3* 2.3*    Recent Labs Lab 07/03/16 0050 07/04/16 0248 07/05/16 0303  LIPASE 1,675* 39 29    Recent Labs Lab 07/06/16 0508  AMMONIA 36*   CBC:  Recent Labs Lab 07/03/16 0050 07/03/16 0405 07/04/16 0248 07/08/16 0546 07/09/16 0546  WBC 7.8 7.2 6.3 8.8 10.6*  HGB 12.8* 12.2* 11.9* 11.8* 12.5*  HCT 38.2* 36.9* 36.4* 36.1* 38.1*  MCV 91.8 91.8 92.2 91.6 92.7  PLT 158 149* 149* 155 164   Cardiac Enzymes: No results for input(s): CKTOTAL, CKMB, CKMBINDEX, TROPONINI in the last 168 hours. BNP: BNP (last 3 results)  Recent Labs   07/06/16 0508 07/07/16 0510 07/08/16 0546  BNP 1,127.6* 1,191.4* 974.7*    ProBNP (last 3 results) No results for input(s): PROBNP in the last 8760 hours.  CBG:  Recent Labs Lab 07/08/16 1401 07/08/16 1651 07/08/16 2049 07/09/16 0728 07/09/16 1217  GLUCAP 107* 169* 179* 124* 186*       Signed:  Ramonda Galyon MD, PhD  [facility] 07/09/2016, 2:47 PM

## 2016-07-09 NOTE — Progress Notes (Signed)
Patient ID: [name], male   DOB: 03-31-1929, 82 y.o.   MRN: [id number]  [facility] Surgery Progress Note  1 Day Post-Op  Subjective: Denies any current abdominal pain. Tolerating diet. Denies n/v.  Plans to go home with [facility] PT. PT to see patient later today.  Objective: Vital signs in last 24 hours: Temp:  [97.6 F (36.4 C)-97.9 F (36.6 C)] 97.9 F (36.6 C) (12/05 0710) Pulse Rate:  [55-81] 63 (12/05 0815) Resp:  [16-21] 20 (12/05 0710) BP: (124-181)/(64-96) 128/67 (12/05 0815) SpO2:  [93 %-98 %] 98 % (12/05 0710) Last BM Date: 07/07/16  Intake/Output from previous day: 12/04 0701 - 12/05 0700 In: 1126.3 [P.O.:240; I.V.:886.3] Out: 475 [Urine:425; Blood:50] Intake/Output this shift: No intake/output data recorded.  PE: Gen:  Alert, NAD, pleasant Card:  RRR, no M/G/R heard Pulm:  CTA, no W/R/R Abd: Soft, ND, NT, +BS, incisions C/D/I  Lab Results:   Recent Labs  07/08/16 0546 07/09/16 0546  WBC 8.8 10.6*  HGB 11.8* 12.5*  HCT 36.1* 38.1*  PLT 155 164   BMET  Recent Labs  07/08/16 0546 07/09/16 0546  NA 132* 132*  K 3.5 3.8  CL 99* 97*  CO2 27 28  GLUCOSE 103* 140*  BUN 16 18  CREATININE 0.81 0.96  CALCIUM 8.6* 8.7*   PT/INR  Recent Labs  07/08/16 0546 07/09/16 0546  LABPROT 18.8* 18.8*  INR 1.56 1.56   CMP     Component Value Date/Time   NA 132 (L) 07/09/2016 0546   K 3.8 07/09/2016 0546   CL 97 (L) 07/09/2016 0546   CO2 28 07/09/2016 0546   GLUCOSE 140 (H) 07/09/2016 0546   GLUCOSE 97 03/16/2015 1552   BUN 18 07/09/2016 0546   BUN 19 06/19/2016 1359   CREATININE 0.96 07/09/2016 0546   CREATININE 1.01 07/02/2016 1420   CALCIUM 8.7 (L) 07/09/2016 0546   PROT 6.0 (L) 07/09/2016 0546   ALBUMIN 2.3 (L) 07/09/2016 0546   AST 57 (H) 07/09/2016 0546   ALT 43 07/09/2016 0546   ALKPHOS 218 (H) 07/09/2016 0546   BILITOT 0.9 07/09/2016 0546   GFRNONAA >60 07/09/2016 0546   GFRNONAA 68 07/02/2016 1420   GFRAA >60 07/09/2016  0546   GFRAA 78 07/02/2016 1420   Lipase     Component Value Date/Time   LIPASE 29 07/05/2016 0303       Studies/Results: No results found.  Anti-infectives: Anti-infectives    Start     Dose/Rate Route Frequency Ordered Stop   07/08/16 1100  ceFAZolin (ANCEF) powder 2 g     2 g Other On call to O.R. 07/08/16 0755 07/09/16 0559       Assessment/Plan Gallstone pancreatitis/Type 1 choledochal cyst - MRCP on 11/30 showed pancreatitis, pancreatic cyst, cirrhosis, gallstone, no CBD stone Cirrhosis - Hepatitis panel negative, non drinker - unable to performed liver biopsy due to "nodular and diseased appearing liver which was very friable precluding liver biopsy." S/p LAPAROSCOPIC CHOLECYSTECTOMY 07/08/16 Dr. Grandville Silos - POD 1 - WBC 10.6, afebrile - abdomen benign, tolerating diet  PAF - on coumadin, INR 2.59on admission, INR 1.56 today (goal 2-3) CAD with MI NSTEMI/Stent 2016 - on aspirin, metoprolol, statin Chronic Diastolic CHF/MV repair HLD AODM   Deconditioning/FTT Gout flare - on prednisone, colchicine, allopurinol, prn toradol, topical capsaicin  ID - ancef perioperatively VTE - coumadin, SCDs FEN - carb modified, PO pain meds  Plan - Recommend PT work with patient postoperatively, then from a surgical standpoint  patient is ready for discharge with follow-up in clinic in 2-3 weeks. INR is 1.56 today with goal of 2-3.   LOS: 6 days    Jerrye Beavers , [facility] Surgery 07/09/2016, 8:55 AM Pager: [phone] Consults: [phone] Mon-Fri 7:00 am-4:30 pm Sat-Sun 7:00 am-11:30 am

## 2016-07-09 NOTE — Progress Notes (Signed)
Notified by bedside RN that MD stopped her in hallway requesting her to ask CM to schedule INR with [facility] RN. CM paged dr Erlinda Hong requesting [facility] orders to include INR draw.

## 2016-07-09 NOTE — Progress Notes (Signed)
ANTICOAGULATION CONSULT NOTE - Follow up [facility] for warfarin  Indication: atrial fibrillation  No Known Allergies  Patient Measurements: Height: 5\' 9"  (175.3 cm) Weight: 198 lb 8 oz (90 kg) IBW/kg (Calculated) : 70.7  Vital Signs: Temp: 97.9 F (36.6 C) (12/05 0710) Temp Source: Oral (12/05 0710) BP: 128/67 (12/05 0815) Pulse Rate: 63 (12/05 0815)  Labs:  Recent Labs  07/07/16 0510 07/08/16 0546 07/09/16 0546  HGB  --  11.8* 12.5*  HCT  --  36.1* 38.1*  PLT  --  155 164  LABPROT 20.3* 18.8* 18.8*  INR 1.72 1.56 1.56  CREATININE 0.84 0.81 0.96    Estimated Creatinine Clearance: 62.4 mL/min (by C-G formula based on SCr of 0.96 mg/dL).   Medical History: Past Medical History:  Diagnosis Date  . Chronic diastolic (congestive) heart failure    a. 10/2013 Echo: EF 55-60%, basal-mid anteroseptal and basal-mid inferoseptal HK.  . Chronic fatigue   . Chronic subdural hematoma ([place])    a. 12/2015 Head CT: chronic right holo-hemispheric SDH w/o midline shift.  . Coronary artery disease    a. 11/2014 NSTEMI: DESx 2 placed to LAD and RCA; b. 08/2015 Cath: LAD patent stent, LCX 85m, RCA patent stent, 47m.  . Diabetes mellitus, type II ([place])    a. HgA1c 6.8 in 03/2015  . ED (erectile dysfunction)   . Gout   . Hemangioma of liver 08/02/2015   12 mm enhancing lesion seen on CT - suspicious for benign hemangioma but not diagnostic - MRI recommended  . HLD (hyperlipidemia)   . Hypertensive heart disease   . Kidney stones   . Lower extremity edema    a. chronic  . OA (osteoarthritis)   . Orthostatic hypotension   . PAF (paroxysmal atrial fibrillation) ([place])    a. 10/2015 s/p DCCV;  b. CHA2DS2VASc = 6--> on coumadin; c. 10/2015 Echo: sev dil LA.  Marland Kitchen Physical deconditioning   . Prostate cancer ([place])   . Pulmonary HTN   . Pulmonary hypertension    a. 10/2015 Echo: PASP 17mmHg.  Marland Kitchen Severe mitral regurgitation    a.  10/2015 s/p minimally invasive MV repair; b.  10/2015 Echo: mod MS, mean grad 63mmHg.  Marland Kitchen Spinal stenosis   . SVT (supraventricular tachycardia) (HCC)    a. recurrent, usually responsive to vagal manuevers  . Tricuspid regurgitation    a. 10/2015 Echo: mild to mod TR.    Medications:  See EMR  Assessment: 51 yoM with PMH of paroxysmal atrial fibrillation on warfarin PTA. Warfarin has been on hold in preparation for procedure today. Pt now s/p lap chole and restarting warfarin tonight. INR remains subtherapeutic at 1.56. Pt diet now carb modified. Last dose PTA was on 11/28.  PTA warfarin dose: 5 mg dose on Sun,Wed, Fri and 4 mg on Mon, Tues, Thurs, Sat.  Goal of Therapy:  INR 2-3 Monitor platelets by anticoagulation protocol: Yes   Plan:  Give warfarin 5 mg po x 1 Monitor daily INR, CBC, clinical course, s/sx of bleed, PO intake, DDI   Thank you for allowing [place] to participate in this patients care. Jens Som, PharmD Pager: [phone] 07/09/2016,8:26 AM

## 2016-07-09 NOTE — Consult Note (Signed)
   [facility] CM Inpatient Consult   07/09/2016  [name] 21-Mar-1929 [id number]   Met with the patient and wife for ongoing [place] Management services. Patient agreeable to ongoing care management with nursing. His wife expresses he may be on new medications.  She states she will continue to manage his medications.  They will have home health care per inpatient RNCM.  [facility] Care Management will follow up post discharge calls and assess for home visits. Patient denies any issues with getting to appointments, medications, or other needs.  Patient has an active consent on file.  For questions, please contact:  Natividad Brood, RN BSN [facility] Liaison  [phone] business mobile phone Toll free office [phone]

## 2016-07-09 NOTE — Anesthesia Postprocedure Evaluation (Signed)
Anesthesia Post Note  Patient: [name]  Procedure(s) Performed: Procedure(s) (LRB): LAPAROSCOPIC CHOLECYSTECTOMY WITH INTRAOPERATIVE CHOLANGIOGRAM (N/A)  Patient location during evaluation: PACU Anesthesia Type: General Level of consciousness: awake Pain management: pain level controlled Vital Signs Assessment: post-procedure vital signs reviewed and stable Respiratory status: spontaneous breathing Cardiovascular status: stable Postop Assessment: no signs of nausea or vomiting Anesthetic complications: no    Last Vitals:  Vitals:   07/09/16 0710 07/09/16 0815  BP: 124/67 128/67  Pulse: 68 63  Resp: 20   Temp: 36.6 C     Last Pain:  Vitals:   07/09/16 0710  TempSrc: Oral  PainSc:                  [name]

## 2016-07-10 ENCOUNTER — Telehealth: Payer: Self-pay | Admitting: Cardiology

## 2016-07-10 ENCOUNTER — Ambulatory Visit (INDEPENDENT_AMBULATORY_CARE_PROVIDER_SITE_OTHER): Payer: Medicare Other | Admitting: Cardiovascular Disease

## 2016-07-10 DIAGNOSIS — Z9889 Other specified postprocedural states: Secondary | ICD-10-CM

## 2016-07-10 LAB — POCT INR: INR: 1.4

## 2016-07-10 NOTE — Telephone Encounter (Signed)
[facility] calling to give INR  INR 1.4  PT 17.1 .  Also in the referral they received from [place] it says to check INR every 3 days  They can do it but this week the next check will be on Saturday Would like to know if they can wait until Monday Please advise.

## 2016-07-10 NOTE — Telephone Encounter (Signed)
See coumadin encounter as of 07/09/2016

## 2016-07-11 ENCOUNTER — Encounter: Payer: Self-pay | Admitting: *Deleted

## 2016-07-11 ENCOUNTER — Other Ambulatory Visit: Payer: Self-pay | Admitting: *Deleted

## 2016-07-11 DIAGNOSIS — I272 Pulmonary hypertension, unspecified: Secondary | ICD-10-CM | POA: Diagnosis not present

## 2016-07-11 DIAGNOSIS — Z7901 Long term (current) use of anticoagulants: Secondary | ICD-10-CM | POA: Diagnosis not present

## 2016-07-11 DIAGNOSIS — Z7982 Long term (current) use of aspirin: Secondary | ICD-10-CM | POA: Diagnosis not present

## 2016-07-11 DIAGNOSIS — I5032 Chronic diastolic (congestive) heart failure: Secondary | ICD-10-CM | POA: Diagnosis not present

## 2016-07-11 DIAGNOSIS — I480 Paroxysmal atrial fibrillation: Secondary | ICD-10-CM | POA: Diagnosis not present

## 2016-07-11 DIAGNOSIS — E785 Hyperlipidemia, unspecified: Secondary | ICD-10-CM | POA: Diagnosis not present

## 2016-07-11 DIAGNOSIS — I2510 Atherosclerotic heart disease of native coronary artery without angina pectoris: Secondary | ICD-10-CM | POA: Diagnosis not present

## 2016-07-11 DIAGNOSIS — Z48815 Encounter for surgical aftercare following surgery on the digestive system: Secondary | ICD-10-CM | POA: Diagnosis not present

## 2016-07-11 DIAGNOSIS — E119 Type 2 diabetes mellitus without complications: Secondary | ICD-10-CM | POA: Diagnosis not present

## 2016-07-11 NOTE — Patient Outreach (Signed)
Transition of care call completed, follow up on referral from [place] to follow pt for transition of care- recent hospitalization 11/29-12/05 status post lab chole. Hx of HF, CAD, DM, pulmonary hypertension.   Spoke with pt, HIPAA verified, discussed purpose of call (transition of care - recent hospital discharge).  Pt reports doing okay since discharge, no c/o pain, to follow up with Dr. Dennard Schaumann next week.  Pt reports weighing 5 days a week, weight 188 lbs, has some swelling in both leg/right being more, no c/o sob.  Pt reports has gout in feet now, taking medication for it.   Pt reports neither he or his wife do not  want to review discharge medications with RN CM at this time, wife reviewed with different people today.  Pt reports his wife gives him his medications, taking all as ordered.    Plan: As discussed with pt, plan to follow up again next week - home visit (part of ongoing transition of care).           Barrier letter sent to Dr. Dennard Schaumann informing of [facility] involvement.     Zara Chess.   [place] Care Management  [phone]

## 2016-07-16 ENCOUNTER — Other Ambulatory Visit: Payer: Self-pay

## 2016-07-16 ENCOUNTER — Other Ambulatory Visit: Payer: Self-pay | Admitting: *Deleted

## 2016-07-16 NOTE — Patient Outreach (Signed)
[facility]) Care Management  07/16/2016  [name] 1928-12-06 [id number]   Arrived at pt's home for scheduled home visit (part of transition of care).  Knocked several times, called home phone number, voice message left.    Called pt's cell number-  Pt reports  currently at MD office.   Plan:  As discussed with pt, plan to call tomorrow as part of transition of care, home visit rescheduled for next week.    Note: Received an in basket from Shary Decamp at Dr. Samella Parr office informing this RN CM that pt showed up today, thought he had an MD office visit.   Hospital f/u appointment was scheduled for pt for 12/15.     Zara Chess.   [place] Care Management  [phone]

## 2016-07-17 ENCOUNTER — Ambulatory Visit (INDEPENDENT_AMBULATORY_CARE_PROVIDER_SITE_OTHER): Payer: Medicare Other

## 2016-07-17 ENCOUNTER — Other Ambulatory Visit: Payer: Self-pay | Admitting: *Deleted

## 2016-07-17 ENCOUNTER — Telehealth: Payer: Self-pay | Admitting: Pharmacist

## 2016-07-17 DIAGNOSIS — I2510 Atherosclerotic heart disease of native coronary artery without angina pectoris: Secondary | ICD-10-CM | POA: Diagnosis not present

## 2016-07-17 DIAGNOSIS — Z48815 Encounter for surgical aftercare following surgery on the digestive system: Secondary | ICD-10-CM | POA: Diagnosis not present

## 2016-07-17 DIAGNOSIS — I480 Paroxysmal atrial fibrillation: Secondary | ICD-10-CM | POA: Diagnosis not present

## 2016-07-17 DIAGNOSIS — Z9889 Other specified postprocedural states: Secondary | ICD-10-CM | POA: Diagnosis not present

## 2016-07-17 DIAGNOSIS — Z7982 Long term (current) use of aspirin: Secondary | ICD-10-CM | POA: Diagnosis not present

## 2016-07-17 DIAGNOSIS — E119 Type 2 diabetes mellitus without complications: Secondary | ICD-10-CM | POA: Diagnosis not present

## 2016-07-17 DIAGNOSIS — I272 Pulmonary hypertension, unspecified: Secondary | ICD-10-CM | POA: Diagnosis not present

## 2016-07-17 DIAGNOSIS — Z7901 Long term (current) use of anticoagulants: Secondary | ICD-10-CM

## 2016-07-17 DIAGNOSIS — E785 Hyperlipidemia, unspecified: Secondary | ICD-10-CM | POA: Diagnosis not present

## 2016-07-17 DIAGNOSIS — I5032 Chronic diastolic (congestive) heart failure: Secondary | ICD-10-CM | POA: Diagnosis not present

## 2016-07-17 LAB — POCT INR: INR: 1.6

## 2016-07-17 NOTE — Telephone Encounter (Signed)
Spoke to [place], physical therapist from [facility] and he was unable to get INR on pt today after 5 sticks with coag machine. There are no nursing orders for home visit so unable to do venipuncture.   Will have him go to coumadin clinic in [place] for check before 330pm today. Pt states understanding and will head there now.

## 2016-07-17 NOTE — Patient Outreach (Signed)
Transition of care call completed, ongoing follow up on recent hospitalization 11/29-12/05 status post lab chole.  Hx of HF,CAD,DM, pulmonary hypertension.  Spoke with pt, HIPAA verified, discussed purpose of call- follow for transition of care.   Pt reports still has some swelling in legs but better.  Pt reports gout is better, taking medication for it.  RN CM discussed upcoming follow  up appointment with Dr. Dennard Schaumann 12/15 to which pt did acknowledge.  Pt reports appetite is on and off, doing okay with it.  Pt reports HH RN was unable to do his Coumadin level today in the home, machine was not working so had to go to the hospital to have it checked.    Plan:  As discussed with pt, plan to follow up again next week - home visit.    Zara Chess.   [place] Care Management  [phone]

## 2016-07-19 ENCOUNTER — Other Ambulatory Visit: Payer: Self-pay | Admitting: Family Medicine

## 2016-07-19 ENCOUNTER — Ambulatory Visit (INDEPENDENT_AMBULATORY_CARE_PROVIDER_SITE_OTHER): Payer: Medicare Other | Admitting: Family Medicine

## 2016-07-19 ENCOUNTER — Encounter: Payer: Self-pay | Admitting: Family Medicine

## 2016-07-19 VITALS — BP 132/74 | HR 76 | Temp 97.8°F | Resp 18 | Ht 67.0 in | Wt 196.0 lb

## 2016-07-19 DIAGNOSIS — Z09 Encounter for follow-up examination after completed treatment for conditions other than malignant neoplasm: Secondary | ICD-10-CM

## 2016-07-19 DIAGNOSIS — Z79899 Other long term (current) drug therapy: Secondary | ICD-10-CM | POA: Diagnosis not present

## 2016-07-19 DIAGNOSIS — R748 Abnormal levels of other serum enzymes: Secondary | ICD-10-CM | POA: Diagnosis not present

## 2016-07-19 NOTE — Progress Notes (Signed)
Subjective:    Patient ID: [name], male    DOB: 1929/01/17, 82 y.o.   MRN: [id number]  HPI  Please see my last office visit. The patient was admitted to the hospital later that evening with biliary colic. He was found to have elevated liver function tests, and elevated lipase and signs of choledocholithiasis. He is admitted to the hospital and underwent laparoscopic cholecystectomy. He tolerated the surgery remarkably well and has been discharged home from the hospital. Since discharge from the hospital, the patient states he is pain-free. He is requiring no pain medication. He denies any constipation. He is having regular bowel movements every day. He denies any cough or shortness of breath or other signs of pneumonia. He denies any dysuria or urgency or other signs of bladder infection. He does have his chronic edema in both legs however it is symmetric. He denies any pleurisy is no evidence of a DVT. I evaluated his surgical site. He has several small incisions from his laparoscopic surgery. All are clean dry and intact with no evidence of cellulitis. Past Medical History:  Diagnosis Date  . Chronic diastolic (congestive) heart failure    a. 10/2013 Echo: EF 55-60%, basal-mid anteroseptal and basal-mid inferoseptal HK.  . Chronic fatigue   . Chronic subdural hematoma ([place])    a. 12/2015 Head CT: chronic right holo-hemispheric SDH w/o midline shift.  . Coronary artery disease    a. 11/2014 NSTEMI: DESx 2 placed to LAD and RCA; b. 08/2015 Cath: LAD patent stent, LCX 67m, RCA patent stent, 68m.  . Diabetes mellitus, type II ([place])    a. HgA1c 6.8 in 03/2015  . ED (erectile dysfunction)   . Gout   . Hemangioma of liver 08/02/2015   12 mm enhancing lesion seen on CT - suspicious for benign hemangioma but not diagnostic - MRI recommended  . HLD (hyperlipidemia)   . Hypertensive heart disease   . Kidney stones   . Lower extremity edema    a. chronic  . OA (osteoarthritis)   . Orthostatic  hypotension   . PAF (paroxysmal atrial fibrillation) ([place])    a. 10/2015 s/p DCCV;  b. CHA2DS2VASc = 6--> on coumadin; c. 10/2015 Echo: sev dil LA.  Marland Kitchen Physical deconditioning   . Prostate cancer ([place])   . Pulmonary HTN   . Pulmonary hypertension    a. 10/2015 Echo: PASP 29mmHg.  Marland Kitchen Severe mitral regurgitation    a.  10/2015 s/p minimally invasive MV repair; b. 10/2015 Echo: mod MS, mean grad 110mmHg.  Marland Kitchen Spinal stenosis   . SVT (supraventricular tachycardia) (HCC)    a. recurrent, usually responsive to vagal manuevers  . Tricuspid regurgitation    a. 10/2015 Echo: mild to mod TR.   Past Surgical History:  Procedure Laterality Date  . BACK SURGERY    . Back surgery x 2    . CARDIAC CATHETERIZATION N/A 08/08/2015   Procedure: Right/Left Heart Cath and Coronary Angiography;  Surgeon: Sherren Mocha, MD;  Location: [place] CV LAB;  Service: Cardiovascular;  Laterality: N/A;  . CARDIAC SURGERY     2 cardiac stents  . CARDIOVERSION N/A 10/20/2015   Procedure: CARDIOVERSION;  Surgeon: Pixie Casino, MD;  Location: [place];  Service: Cardiovascular;  Laterality: N/A;  . CERVICAL SPINE SURGERY    . CHOLECYSTECTOMY N/A 07/08/2016   Procedure: LAPAROSCOPIC CHOLECYSTECTOMY WITH INTRAOPERATIVE CHOLANGIOGRAM;  Surgeon: Georganna Skeans, MD;  Location: [place];  Service: General;  Laterality: N/A;  . EYE SURGERY    .  INTRAOPERATIVE TRANSESOPHAGEAL ECHOCARDIOGRAM  10/05/2015   Procedure: INTRAOPERATIVE TRANSESOPHAGEAL ECHOCARDIOGRAM;  Surgeon: Rexene Alberts, MD;  Location: [facility] OR;  Service: Open Heart Surgery;;  . JOINT REPLACEMENT     shoulder  . LEFT HEART CATHETERIZATION WITH CORONARY ANGIOGRAM N/A 11/16/2014   Procedure: LEFT HEART CATHETERIZATION WITH CORONARY ANGIOGRAM;  Surgeon: Troy Sine, MD;  Location: [facility] CATH LAB;  Service: Cardiovascular;  Laterality: N/A;  . MITRAL VALVE REPAIR Right 10/04/2015   Procedure: MINIMALLY INVASIVE MITRAL VALVE REPAIR (MVR);  Surgeon: Rexene Alberts, MD;   Location: [place];  Service: Open Heart Surgery;  Laterality: Right;  . Rt rotator cuff repair    . TEE WITHOUT CARDIOVERSION N/A 06/22/2015   Procedure: TRANSESOPHAGEAL ECHOCARDIOGRAM (TEE);  Surgeon: Skeet Latch, MD;  Location: [place];  Service: Cardiovascular;  Laterality: N/A;  . TEE WITHOUT CARDIOVERSION N/A 10/04/2015   Procedure: TRANSESOPHAGEAL ECHOCARDIOGRAM (TEE);  Surgeon: Rexene Alberts, MD;  Location: [place];  Service: Open Heart Surgery;  Laterality: N/A;  . WOUND EXPLORATION N/A 10/05/2015   Procedure: Sternal Incision;  Surgeon: Rexene Alberts, MD;  Location: [place];  Service: Open Heart Surgery;  Laterality: N/A;   Current Outpatient Prescriptions on File Prior to Visit  Medication Sig Dispense Refill  . allopurinol (ZYLOPRIM) 100 MG tablet Take 1 tablet (100 mg total) by mouth daily. 30 tablet 0  . aspirin EC 81 MG tablet Take 81 mg by mouth every other day. Reported on 12/29/2015    . colchicine 0.6 MG tablet Take 1 tablet (0.6 mg total) by mouth daily. 30 tablet 0  . finasteride (PROSCAR) 5 MG tablet TAKE ONE (1) TABLET EACH DAY 30 tablet 11  . furosemide (LASIX) 40 MG tablet Take 1 tablet (40 mg total) by mouth 2 (two) times daily. 60 tablet 3  . magnesium oxide (MAG-OX) 400 MG tablet Take 1 tablet (400 mg total) by mouth daily. 30 tablet 0  . metoprolol tartrate (LOPRESSOR) 25 MG tablet Take 0.5 tablets (12.5 mg total) by mouth 2 (two) times daily. 60 tablet 3  . OVER THE COUNTER MEDICATION Neuroquell: Take 1 capsule by mouth once a day for immune health    . potassium chloride (KLOR-CON) 20 MEQ packet Take 20 mEq by mouth daily. 30 packet 0  . pravastatin (PRAVACHOL) 40 MG tablet Take 1 tablet (40 mg total) by mouth daily at 6 PM. 30 tablet 11  . warfarin (COUMADIN) 2 MG tablet Take 1-2 tablets daily or as directed by coumadin clinic (Patient taking differently: Take 4-5 mg by mouth See admin instructions. 5 mg in the evening on Sun/Wed/Fri and 4 mg on  Mon/Tues/Thurs/Sat) 60 tablet 1  . Wheat Dextrin (BENEFIBER) POWD Take by mouth See admin instructions. Mix one teaspoonful into 6-8 ounces of fluid/milk and drink once daily     No current facility-administered medications on file prior to visit.    No Known Allergies Social History   Social History  . Marital status: Married    Spouse name: N/A  . Number of children: 4  . Years of education: N/A   Occupational History  .  Retired   Social History Main Topics  . Smoking status: Former Research scientist (life sciences)  . Smokeless tobacco: Never Used     Comment: QUIT SMOKING  MANY YEARS AGO"  . Alcohol use No  . Drug use: No  . Sexual activity: Not on file   Other Topics Concern  . Not on file   Social History Narrative   **  Merged History Encounter **       Four adopted children.  Lives alone.      Review of Systems  All other systems reviewed and are negative.      Objective:   Physical Exam  Neck: Neck supple. No JVD present.  Cardiovascular: Normal rate and regular rhythm.   Murmur heard. Pulmonary/Chest: Effort normal. He has no wheezes. He has no rales.  Abdominal: Soft. Bowel sounds are normal.  Musculoskeletal: He exhibits edema.  Lymphadenopathy:    He has no cervical adenopathy.  Neurological: He has normal reflexes. No cranial nerve deficit. He exhibits normal muscle tone.  Vitals reviewed.    Wt Readings from Last 3 Encounters:  07/19/16 196 lb (88.9 kg)  07/08/16 198 lb 8 oz (90 kg)  07/02/16 198 lb (89.8 kg)        Assessment & Plan:  Hospital discharge follow-up - Plan: CBC with Differential/Platelet, COMPLETE METABOLIC PANEL WITH GFR  Patient has done remarkably well after his surgery. There is no evidence of postoperative complication. There is no evidence of pneumonia or bladder infection or DVT. He is ambulating without difficulty. He is not constipated. I will check a CBC to evaluate for any postoperative bleeding. His INR is being managed by his  cardiologist.

## 2016-07-20 LAB — CBC WITH DIFFERENTIAL/PLATELET
BASOS ABS: 0 {cells}/uL (ref 0–200)
Basophils Relative: 0 %
Eosinophils Absolute: 144 cells/uL (ref 15–500)
Eosinophils Relative: 2 %
HEMATOCRIT: 40.4 % (ref 38.5–50.0)
HEMOGLOBIN: 12.8 g/dL — AB (ref 13.0–17.0)
LYMPHS ABS: 2664 {cells}/uL (ref 850–3900)
LYMPHS PCT: 37 %
MCH: 29.6 pg (ref 27.0–33.0)
MCHC: 31.7 g/dL — AB (ref 32.0–36.0)
MCV: 93.3 fL (ref 80.0–100.0)
MONO ABS: 504 {cells}/uL (ref 200–950)
MPV: 10.7 fL (ref 7.5–12.5)
Monocytes Relative: 7 %
NEUTROS PCT: 54 %
Neutro Abs: 3888 cells/uL (ref 1500–7800)
Platelets: 190 10*3/uL (ref 140–400)
RBC: 4.33 MIL/uL (ref 4.20–5.80)
RDW: 14 % (ref 11.0–15.0)
WBC: 7.2 10*3/uL (ref 3.8–10.8)

## 2016-07-20 LAB — COMPLETE METABOLIC PANEL WITH GFR
ALBUMIN: 3.1 g/dL — AB (ref 3.6–5.1)
ALK PHOS: 200 U/L — AB (ref 40–115)
ALT: 27 U/L (ref 9–46)
AST: 44 U/L — ABNORMAL HIGH (ref 10–35)
BUN: 15 mg/dL (ref 7–25)
CALCIUM: 9.1 mg/dL (ref 8.6–10.3)
CO2: 28 mmol/L (ref 20–31)
Chloride: 102 mmol/L (ref 98–110)
Creat: 1.1 mg/dL (ref 0.70–1.11)
GFR, EST AFRICAN AMERICAN: 70 mL/min (ref >=60)
GFR, EST NON AFRICAN AMERICAN: 61 mL/min (ref >=60)
Glucose, Bld: 103 mg/dL — ABNORMAL HIGH (ref 70–99)
POTASSIUM: 4.4 mmol/L (ref 3.5–5.3)
Sodium: 138 mmol/L (ref 135–146)
Total Bilirubin: 0.7 mg/dL (ref 0.2–1.2)
Total Protein: 6.4 g/dL (ref 6.1–8.1)

## 2016-07-22 ENCOUNTER — Encounter: Payer: Self-pay | Admitting: Family Medicine

## 2016-07-22 LAB — GAMMA GT: GGT: 190 U/L — AB (ref 7–51)

## 2016-07-23 ENCOUNTER — Encounter: Payer: Self-pay | Admitting: *Deleted

## 2016-07-23 ENCOUNTER — Other Ambulatory Visit: Payer: Self-pay | Admitting: *Deleted

## 2016-07-23 NOTE — Patient Outreach (Addendum)
[facility]) Care Management   07/23/2016  [name] [birth year] [id number]  [name] is an 82 y.o. male  Subjective:  Pt reports still weak, told it would take time to recover, started using his cane when Going outdoors.   Pt reports he has had no pain  from operation (lab chole), was told had 100 gallstones.   Pt reports appetite is good, eating 2-3 times a day.   Pt reports HH PT and HH RN  coming once a week now.   Pt reports followed up with Dr. Dennard Schaumann, to see Heart MD 12/22.  Pt reports weighing 5 days a week, recording, today weight was 188.6 lbs, yesterday 187.4 lbs, day before 188 lbs.      Objective:   Vitals:   07/23/16 1159  BP: 110/64  Pulse: 63  Resp: 20    ROS  Physical Exam  Constitutional: He is oriented to person, place, and time. He appears well-developed and well-nourished.  Cardiovascular: Normal rate, regular rhythm and normal heart sounds.   Respiratory: Effort normal and breath sounds normal.  GI: Soft. Bowel sounds are normal.  Musculoskeletal: Normal range of motion.  Neurological: He is alert and oriented to person, place, and time.  Skin: Skin is warm and dry.  Psychiatric: He has a normal mood and affect. His behavior is normal. Judgment and thought content normal.    Encounter Medications:   Outpatient Encounter Prescriptions as of 07/23/2016  Medication Sig Note  . allopurinol (ZYLOPRIM) 100 MG tablet Take 1 tablet (100 mg total) by mouth daily.   Marland Kitchen aspirin EC 81 MG tablet Take 81 mg by mouth every other day. Reported on 12/29/2015   . colchicine 0.6 MG tablet Take 1 tablet (0.6 mg total) by mouth daily.   Marland Kitchen OVER THE COUNTER MEDICATION Neuroquell: Take 1 capsule by mouth once a day for immune health   . potassium chloride (KLOR-CON) 20 MEQ packet Take 20 mEq by mouth daily.   . pravastatin (PRAVACHOL) 40 MG tablet Take 1 tablet (40 mg total) by mouth daily at 6 PM.   . warfarin (COUMADIN) 2 MG tablet Take 1-2  tablets daily or as directed by coumadin clinic (Patient taking differently: Take 4-5 mg by mouth See admin instructions. 5 mg in the evening on Sun/Wed/Fri and 4 mg on Mon/Tues/Thurs/Sat) 07/23/2016: Take as ordered.   . finasteride (PROSCAR) 5 MG tablet TAKE ONE (1) TABLET EACH DAY   . furosemide (LASIX) 40 MG tablet Take 1 tablet (40 mg total) by mouth 2 (two) times daily.   . magnesium oxide (MAG-OX) 400 MG tablet Take 1 tablet (400 mg total) by mouth daily. (Patient not taking: Reported on 07/23/2016)   . metoprolol tartrate (LOPRESSOR) 25 MG tablet Take 0.5 tablets (12.5 mg total) by mouth 2 (two) times daily.   . Wheat Dextrin (BENEFIBER) POWD Take by mouth See admin instructions. Mix one teaspoonful into 6-8 ounces of fluid/milk and drink once daily    No facility-administered encounter medications on file as of 07/23/2016.   Patient was recently discharged from hospital and all medications have been reviewed.  Functional Status:   In your present state of health, do you have any difficulty performing the following activities: 07/23/2016 07/03/2016  Hearing? Tempie Donning  Vision? Y N  Difficulty concentrating or making decisions? N N  Walking or climbing stairs? N Y  Dressing or bathing? N N  Doing errands, shopping? N -  Preparing Food and eating ?  Y -  Using the Toilet? N -  In the past six months, have you accidently leaked urine? N -  Do you have problems with loss of bowel control? N -  Managing your Medications? N -  Managing your Finances? N -  Housekeeping or managing your Housekeeping? N -  Some recent data might be hidden    Fall/Depression Screening:    PHQ 2/9 Scores 07/23/2016 12/01/2015 11/09/2015 04/26/2014 04/26/2014 11/16/2013 10/11/2013  PHQ - 2 Score 0 0 0 0 2 0 0  PHQ- 9 Score - - - 0 7 - -    Assessment:  Pleasant 82 year old gentleman, resides with spouse.   Recent surgery (lab chole): three  abdominal  incision sites intact, no signs/symptoms of infection noted. HF:   Lungs clear, no c/o sob, chest pain.  No edema noted.  Per pt, weight today 188.6 lbs. Pt aware to call  For weight gain of 3 lbs in a day, 5 lbs in a week.     Plan:  Plan to continue to follow pt for transition of care, follow up again next week telephonically.             Plan to send Dr. Dennard Schaumann by in basket 12/19 home visit encounter.    [facility] CM Care Plan Problem One   Flowsheet Row Most Recent Value  Care Plan Problem One  Risk for readmission related to recent hospitalization s/p lab chole, hx of HF, CAD.   Role Documenting the Problem One  Care Management Coordinator  Care Plan for Problem One  Active  THN Long Term Goal (31-90 days)  Pt would not readmit within the next 31 days   THN Long Term Goal Start Date  07/11/16  Interventions for Problem One Long Term Goal  Home visit done, meds reviewed, assessment done.    THN CM Short Term Goal #1 (0-30 days)  Pt would keep all MD f/u appointments within the next 30 days   THN CM Short Term Goal #1 Start Date  07/11/16  Interventions for Short Term Goal #1  Reviewed with pt recent visit with Primary Care MD, to see Heart MD 12/22.   THN CM Short Term Goal #2 (0-30 days)  Pt weakness would continue to improve in the next 14 days   THN CM Short Term Goal #2 Start Date  07/23/16  Interventions for Short Term Goal #2  Discussed with pt to continue to eat healthy, exercise, rest as needed.      Zara Chess.   [place] Care Management  [phone]

## 2016-07-24 ENCOUNTER — Ambulatory Visit: Payer: Medicare Other | Admitting: *Deleted

## 2016-07-24 DIAGNOSIS — I272 Pulmonary hypertension, unspecified: Secondary | ICD-10-CM | POA: Diagnosis not present

## 2016-07-24 DIAGNOSIS — I2510 Atherosclerotic heart disease of native coronary artery without angina pectoris: Secondary | ICD-10-CM | POA: Diagnosis not present

## 2016-07-24 DIAGNOSIS — Z48815 Encounter for surgical aftercare following surgery on the digestive system: Secondary | ICD-10-CM | POA: Diagnosis not present

## 2016-07-24 DIAGNOSIS — I5032 Chronic diastolic (congestive) heart failure: Secondary | ICD-10-CM | POA: Diagnosis not present

## 2016-07-24 DIAGNOSIS — Z7982 Long term (current) use of aspirin: Secondary | ICD-10-CM | POA: Diagnosis not present

## 2016-07-24 DIAGNOSIS — I480 Paroxysmal atrial fibrillation: Secondary | ICD-10-CM | POA: Diagnosis not present

## 2016-07-24 DIAGNOSIS — Z7901 Long term (current) use of anticoagulants: Secondary | ICD-10-CM | POA: Diagnosis not present

## 2016-07-24 DIAGNOSIS — E119 Type 2 diabetes mellitus without complications: Secondary | ICD-10-CM | POA: Diagnosis not present

## 2016-07-24 DIAGNOSIS — E785 Hyperlipidemia, unspecified: Secondary | ICD-10-CM | POA: Diagnosis not present

## 2016-07-24 NOTE — Progress Notes (Signed)
HPI  The patient for followup of HeFPEF, CAD, narrow complex tachycardia MR and a host of other medical problems as listed below.  He is status post MV repair in 10/2015.  His immediate post op course was complicated by hypotension inducible SAM and MR.  This improved with medication adjustment including adding a beta blocker.  Post op he had atrial fib (requiring DCCV and amiodarone), acute on chronic diastolic HF, syncope with orthostasis.  Since I last saw him he was in the hospital with gallstone pancreatitis.  He did well with surgical treatment of this actually. He's been home and getting around slowly with his walker. He denies any acute cardiovascular symptoms. He's had no new shortness of breath, PND or orthopnea. He's had no palpitations, presyncope or syncope. He's had no chest pressure, neck or arm discomfort. He's had no weight gain or edema.  No Known Allergies  Current Outpatient Prescriptions  Medication Sig Dispense Refill  . allopurinol (ZYLOPRIM) 100 MG tablet Take 1 tablet (100 mg total) by mouth daily. 30 tablet 0  . aspirin EC 81 MG tablet Take 81 mg by mouth every other day. Reported on 12/29/2015    . colchicine 0.6 MG tablet Take 1 tablet (0.6 mg total) by mouth daily. 30 tablet 0  . finasteride (PROSCAR) 5 MG tablet TAKE ONE (1) TABLET EACH DAY 30 tablet 11  . furosemide (LASIX) 40 MG tablet Take 1 tablet (40 mg total) by mouth 2 (two) times daily. 60 tablet 3  . magnesium oxide (MAG-OX) 400 MG tablet Take 1 tablet (400 mg total) by mouth daily. 30 tablet 0  . metoprolol tartrate (LOPRESSOR) 25 MG tablet Take 0.5 tablets (12.5 mg total) by mouth 2 (two) times daily. 60 tablet 3  . OVER THE COUNTER MEDICATION Neuroquell: Take 1 capsule by mouth once a day for immune health    . potassium chloride (KLOR-CON) 20 MEQ packet Take 20 mEq by mouth daily. 30 packet 0  . pravastatin (PRAVACHOL) 40 MG tablet Take 1 tablet (40 mg total) by mouth daily at 6 PM. 30 tablet 11  .  warfarin (COUMADIN) 2 MG tablet Take 1-2 tablets daily or as directed by coumadin clinic (Patient taking differently: Take 4-5 mg by mouth See admin instructions. 5 mg in the evening on Sun/Wed/Fri and 4 mg on Mon/Tues/Thurs/Sat) 60 tablet 1  . Wheat Dextrin (BENEFIBER) POWD Take by mouth See admin instructions. Mix one teaspoonful into 6-8 ounces of fluid/milk and drink once daily     No current facility-administered medications for this visit.     Past Medical History:  Diagnosis Date  . Chronic diastolic (congestive) heart failure    a. 10/2013 Echo: EF 55-60%, basal-mid anteroseptal and basal-mid inferoseptal HK.  . Chronic fatigue   . Chronic subdural hematoma ([place])    a. 12/2015 Head CT: chronic right holo-hemispheric SDH w/o midline shift.  . Coronary artery disease    a. 11/2014 NSTEMI: DESx 2 placed to LAD and RCA; b. 08/2015 Cath: LAD patent stent, LCX 38m, RCA patent stent, 30m.  . Diabetes mellitus, type II ([place])    a. HgA1c 6.8 in 03/2015  . ED (erectile dysfunction)   . Gout   . Hemangioma of liver 08/02/2015   12 mm enhancing lesion seen on CT - suspicious for benign hemangioma but not diagnostic - MRI recommended  . HLD (hyperlipidemia)   . Hypertensive heart disease   . Kidney stones   . Lower extremity edema  a. chronic  . OA (osteoarthritis)   . Orthostatic hypotension   . PAF (paroxysmal atrial fibrillation) ([place])    a. 10/2015 s/p DCCV;  b. CHA2DS2VASc = 6--> on coumadin; c. 10/2015 Echo: sev dil LA.  Marland Kitchen Physical deconditioning   . Prostate cancer ([place])   . Pulmonary HTN   . Pulmonary hypertension    a. 10/2015 Echo: PASP 1mmHg.  Marland Kitchen Severe mitral regurgitation    a.  10/2015 s/p minimally invasive MV repair; b. 10/2015 Echo: mod MS, mean grad 44mmHg.  Marland Kitchen Spinal stenosis   . SVT (supraventricular tachycardia) ([facility])    a. recurrent, usually responsive to vagal manuevers  . Tricuspid regurgitation    a. 10/2015 Echo: mild to mod TR.    Past Surgical History:    Procedure Laterality Date  . BACK SURGERY    . Back surgery x 2    . CARDIAC CATHETERIZATION N/A 08/08/2015   Procedure: Right/Left Heart Cath and Coronary Angiography;  Surgeon: Sherren Mocha, MD;  Location: [facility];  Service: Cardiovascular;  Laterality: N/A;  . CARDIAC SURGERY     2 cardiac stents  . CARDIOVERSION N/A 10/20/2015   Procedure: CARDIOVERSION;  Surgeon: Pixie Casino, MD;  Location: [facility];  Service: Cardiovascular;  Laterality: N/A;  . CERVICAL SPINE SURGERY    . CHOLECYSTECTOMY N/A 07/08/2016   Procedure: LAPAROSCOPIC CHOLECYSTECTOMY WITH INTRAOPERATIVE CHOLANGIOGRAM;  Surgeon: Georganna Skeans, MD;  Location: [place];  Service: General;  Laterality: N/A;  . EYE SURGERY    . INTRAOPERATIVE TRANSESOPHAGEAL ECHOCARDIOGRAM  10/05/2015   Procedure: INTRAOPERATIVE TRANSESOPHAGEAL ECHOCARDIOGRAM;  Surgeon: Rexene Alberts, MD;  Location: [facility] OR;  Service: Open Heart Surgery;;  . JOINT REPLACEMENT     shoulder  . LEFT HEART CATHETERIZATION WITH CORONARY ANGIOGRAM N/A 11/16/2014   Procedure: LEFT HEART CATHETERIZATION WITH CORONARY ANGIOGRAM;  Surgeon: Troy Sine, MD;  Location: [facility] CATH LAB;  Service: Cardiovascular;  Laterality: N/A;  . MITRAL VALVE REPAIR Right 10/04/2015   Procedure: MINIMALLY INVASIVE MITRAL VALVE REPAIR (MVR);  Surgeon: Rexene Alberts, MD;  Location: [place];  Service: Open Heart Surgery;  Laterality: Right;  . Rt rotator cuff repair    . TEE WITHOUT CARDIOVERSION N/A 06/22/2015   Procedure: TRANSESOPHAGEAL ECHOCARDIOGRAM (TEE);  Surgeon: Skeet Latch, MD;  Location: [facility];  Service: Cardiovascular;  Laterality: N/A;  . TEE WITHOUT CARDIOVERSION N/A 10/04/2015   Procedure: TRANSESOPHAGEAL ECHOCARDIOGRAM (TEE);  Surgeon: Rexene Alberts, MD;  Location: [place];  Service: Open Heart Surgery;  Laterality: N/A;  . WOUND EXPLORATION N/A 10/05/2015   Procedure: Sternal Incision;  Surgeon: Rexene Alberts, MD;  Location: [place];  Service: Open Heart  Surgery;  Laterality: N/A;     ROS: Some difficulty swallowing, leg swelling. Otherwise as stated in the HPI and negative for all other systems.  PHYSICAL EXAM BP 108/66   Pulse 67   Ht 5\' 7"  (1.702 m)   Wt 186 lb (84.4 kg)   BMI 29.13 kg/m  GEN:  No distress, somewhat frail NECK:  No jugular venous distention at 90 degrees, waveform within normal limits, carotid upstroke brisk and symmetric, no bruits, no thyromegaly LYMPHATICS:  No cervical adenopathy LUNGS:  Clear to auscultation bilaterally BACK:  No CVA tenderness CHEST:  Unremarkable HEART:  S1 and S2 within normal limits, no S3, no S4, no clicks, no rubs, no murmurs ABD:  Positive bowel sounds normal in frequency in pitch, no bruits, no rebound, no guarding, unable to assess  midline mass or bruit with the patient seated, healed surgical scars.  EXT:  2 plus pulses throughout, no edema, no cyanosis no clubbing    Lab Results  Component Value Date   CHOL 158 06/05/2015   TRIG 235 (H) 06/05/2015   HDL 31 (L) 06/05/2015   Ragan 80 06/05/2015     ASSESSMENT AND PLAN  Mitral valve repair:   He is doing very well.  No change in therapy is planned.  He will continue with the meds as listed.  He had stable appearance on echo in March  Atrial fib:    Mr. [name] has a CHA2DS2 - VASc score of 5 with a risk of stroke of 6.7%.    He tolerates anticoagulation.  He will continue with this.    SVT-  He has had no recurrent symptoms.  No change in therapy is planned.  HFpEF/severe LVH/mod MR/pHTN (RVSP 49mmHg) -  He seems to be euvolemic. He will continue the meds as listed.  He had labs on the 15th that were stable.    CAD-   He has no symptoms.  No change in therapy is planned.    DM- I will defer to [place], MD  HLD- LDL 80. Goal <70 with CAD. We placed him on statin.  He will need a fasting lipid profile.  Liver enzymes have been followed.  Hospital records reviewed.

## 2016-07-26 ENCOUNTER — Ambulatory Visit (INDEPENDENT_AMBULATORY_CARE_PROVIDER_SITE_OTHER): Payer: Medicare Other | Admitting: Pharmacist Clinician (PhC)/ Clinical Pharmacy Specialist

## 2016-07-26 ENCOUNTER — Ambulatory Visit (INDEPENDENT_AMBULATORY_CARE_PROVIDER_SITE_OTHER): Payer: Medicare Other | Admitting: Cardiology

## 2016-07-26 ENCOUNTER — Encounter: Payer: Self-pay | Admitting: Cardiology

## 2016-07-26 VITALS — BP 108/66 | HR 67 | Ht 67.0 in | Wt 186.0 lb

## 2016-07-26 DIAGNOSIS — I2510 Atherosclerotic heart disease of native coronary artery without angina pectoris: Secondary | ICD-10-CM

## 2016-07-26 DIAGNOSIS — Z7901 Long term (current) use of anticoagulants: Secondary | ICD-10-CM | POA: Diagnosis not present

## 2016-07-26 DIAGNOSIS — I480 Paroxysmal atrial fibrillation: Secondary | ICD-10-CM | POA: Diagnosis not present

## 2016-07-26 DIAGNOSIS — Z9889 Other specified postprocedural states: Secondary | ICD-10-CM

## 2016-07-26 LAB — POCT INR: INR: 2.2

## 2016-07-26 NOTE — Patient Instructions (Signed)
Medication Instructions:  Continue current medications  Labwork: None Ordered  Testing/Procedures: None Ordered  Follow-Up: Your physician wants you to follow-up in: 6 Months. You will receive a reminder letter in the mail two months in advance. If you don't receive a letter, please call our office to schedule the follow-up appointment.   Any Other Special Instructions Will Be Listed Below (If Applicable).         HAPPY HOLIDAY   If you need a refill on your cardiac medications before your next appointment, please call your pharmacy.

## 2016-07-31 ENCOUNTER — Other Ambulatory Visit: Payer: Self-pay | Admitting: *Deleted

## 2016-07-31 DIAGNOSIS — I480 Paroxysmal atrial fibrillation: Secondary | ICD-10-CM | POA: Diagnosis not present

## 2016-07-31 DIAGNOSIS — Z48815 Encounter for surgical aftercare following surgery on the digestive system: Secondary | ICD-10-CM | POA: Diagnosis not present

## 2016-07-31 DIAGNOSIS — E785 Hyperlipidemia, unspecified: Secondary | ICD-10-CM | POA: Diagnosis not present

## 2016-07-31 DIAGNOSIS — E119 Type 2 diabetes mellitus without complications: Secondary | ICD-10-CM | POA: Diagnosis not present

## 2016-07-31 DIAGNOSIS — Z7901 Long term (current) use of anticoagulants: Secondary | ICD-10-CM | POA: Diagnosis not present

## 2016-07-31 DIAGNOSIS — Z7982 Long term (current) use of aspirin: Secondary | ICD-10-CM | POA: Diagnosis not present

## 2016-07-31 DIAGNOSIS — I5032 Chronic diastolic (congestive) heart failure: Secondary | ICD-10-CM | POA: Diagnosis not present

## 2016-07-31 DIAGNOSIS — I2510 Atherosclerotic heart disease of native coronary artery without angina pectoris: Secondary | ICD-10-CM | POA: Diagnosis not present

## 2016-07-31 DIAGNOSIS — I272 Pulmonary hypertension, unspecified: Secondary | ICD-10-CM | POA: Diagnosis not present

## 2016-07-31 NOTE — Patient Outreach (Signed)
Attempt made to contact pt as part of ongoing transition of care, follow up on recent hospitalization 11/29-12/05 status post lab chole.  Pt has a hx of HF, CAD, DM, pulmonary hypertension.   HIPAA compliant voice message left with contact name and number.    Plan:  If no response, plan to follow up again telephonically  within the next 2 days.     Zara Chess.   [place] Care Management  [phone]

## 2016-08-02 ENCOUNTER — Other Ambulatory Visit: Payer: Self-pay | Admitting: *Deleted

## 2016-08-02 NOTE — Patient Outreach (Signed)
Transition of care call completed, ongoing follow up on recent hospitalization 11/29-12/05 for status post lap chole.  Hx of HF, CAD,DM,pulmonary hypertension.   Spoke with pt, HIPAA/identity verified.  Pt reports on recent visit with Heart MD (Dr. Percival Spanish), received  A good report.  Pt reports a weight gain of 6 oz in the last 3 days, did not weigh today yet, yesterday was 186.4 lbs., no edema.   Pt reports has no energy, was told it would take time.  Pt reports his wife makes sure he takes his medications and he has stopped  using his cane indoors.    Plan:  As discussed with pt, RN CM to follow up again next week telephonically (part of ongoing transition of care).  Zara Chess.   [place] Care Management  [phone]

## 2016-08-06 DIAGNOSIS — H16223 Keratoconjunctivitis sicca, not specified as Sjogren's, bilateral: Secondary | ICD-10-CM | POA: Diagnosis not present

## 2016-08-07 DIAGNOSIS — E119 Type 2 diabetes mellitus without complications: Secondary | ICD-10-CM | POA: Diagnosis not present

## 2016-08-07 DIAGNOSIS — Z48815 Encounter for surgical aftercare following surgery on the digestive system: Secondary | ICD-10-CM | POA: Diagnosis not present

## 2016-08-07 DIAGNOSIS — E785 Hyperlipidemia, unspecified: Secondary | ICD-10-CM | POA: Diagnosis not present

## 2016-08-07 DIAGNOSIS — I5032 Chronic diastolic (congestive) heart failure: Secondary | ICD-10-CM | POA: Diagnosis not present

## 2016-08-07 DIAGNOSIS — I2510 Atherosclerotic heart disease of native coronary artery without angina pectoris: Secondary | ICD-10-CM | POA: Diagnosis not present

## 2016-08-07 DIAGNOSIS — I480 Paroxysmal atrial fibrillation: Secondary | ICD-10-CM | POA: Diagnosis not present

## 2016-08-07 DIAGNOSIS — Z7982 Long term (current) use of aspirin: Secondary | ICD-10-CM | POA: Diagnosis not present

## 2016-08-07 DIAGNOSIS — I272 Pulmonary hypertension, unspecified: Secondary | ICD-10-CM | POA: Diagnosis not present

## 2016-08-07 DIAGNOSIS — Z7901 Long term (current) use of anticoagulants: Secondary | ICD-10-CM | POA: Diagnosis not present

## 2016-08-08 ENCOUNTER — Telehealth: Payer: Self-pay | Admitting: Family Medicine

## 2016-08-08 ENCOUNTER — Other Ambulatory Visit: Payer: Self-pay | Admitting: *Deleted

## 2016-08-08 MED ORDER — ALLOPURINOL 100 MG PO TABS: 100.0000 mg | ORAL_TABLET | Freq: Every day | ORAL | 5 refills | Status: DC

## 2016-08-08 NOTE — Telephone Encounter (Signed)
Medication called/sent to requested pharmacy  

## 2016-08-08 NOTE — Patient Outreach (Signed)
Transition of care call completed, ongoing follow up on recent hospitalization 11/29-12/05 status post lap chole.  Hx of HF, CAD, DM, pulmonary hypertension.  Spoke with pt, HIPAA/identity verified, discussed purpose of call.  Pt reports weakness doing better, resting, no c/o pain or swelling.  Pt reports weight today was 189 lbs.  Pt reports eating better than he was a year ago.   Pt reports HH PT came yesterday.     Plan:  As discussed with pt, plan to follow up again next week telephonically (part of ongoing transition of care).    Zara Chess.   [place] Care Management  [phone]

## 2016-08-08 NOTE — Telephone Encounter (Signed)
Wife requesting a refill on patients allopurinol she states that the pharmacy told her they sent this request 2 days ago.  They use [facility] she would like to pick this up today.   CB# [phone]

## 2016-08-12 ENCOUNTER — Other Ambulatory Visit: Payer: Self-pay | Admitting: *Deleted

## 2016-08-12 ENCOUNTER — Ambulatory Visit: Payer: Self-pay | Admitting: *Deleted

## 2016-08-12 NOTE — Patient Outreach (Signed)
Attempt made to contact pt for final transition of care call, ongoing follow up on recent hospitalization 11/29-12/05 status post lap cholecystectomy.  Hx of HF,CAD,DM,pulmonary hypertension.   HIPAA compliant voice message left with contact name and number.   Plan: if no response, plan to try again telephonically  within next 2 days.     [name].   [place] Care Management  [phone]

## 2016-08-13 ENCOUNTER — Other Ambulatory Visit: Payer: Self-pay | Admitting: *Deleted

## 2016-08-13 NOTE — Patient Outreach (Signed)
Follow up phone call successful.  Spoke with pt, HIPAA/identity verified.  RN CM inquired of pt if follow up with GI MD post recent hospital discharge  or if appointment was made (per discharge instructions, to f/u with GI MD).   Pt reports no one contacted him, Heart MD discussed liver enzymes were up.    Pt reports he did f/u with surgeon post discharge.  RN CM  discussed talking to Primary Care MD about this at next office visit.    Plan:  As discussed with pt, plan to follow up again  next month- home visit.     Zara Chess.   [place] Care Management  [phone]

## 2016-08-13 NOTE — Patient Outreach (Addendum)
Final transition of care call successful- ongoing follow up on recent hospitalization 11/29-12/05 status post lab cholecystectomy.  Hx of HF, CAD,DM,pulmonary hypertension.   Spoke with pt, HIPAA verified/identity verified.   Pt reports weight this am was 189.6 lbs, same as yesterday, no swelling.   Pt reports sob no more than usual.  Pt reports he does not record his weights, keeps them in his head.  RN CM discussed with pt  A  weight gain of >2 lbs in day, 5 lbs in a week  To call MD to which pt voiced understanding.   Pt reports this week was last visit with HHPT, still following his instructions.   Pt reports not as weak as I was, eating better, watch sodium.   Pt reports no recent MD visits, Heart MD said to come back in 6 months, Dr. Edilia Bo to follow up in 12 months.  Pt reports taking all of his medications, spouse fixes a  weekly pill planner.  RN CM discussed with pt today final transition of care call, plan to follow up again next month (home visit) to which pt agreed.    Plan:  Transition of care program completed, care plan updated.           As discussed with pt, plan to continue to provide community nurse case management services, follow up again next month-            Home visit.     Zara Chess.   [place] [place] [facility] Management  [phone]

## 2016-08-14 ENCOUNTER — Ambulatory Visit (INDEPENDENT_AMBULATORY_CARE_PROVIDER_SITE_OTHER): Payer: Medicare Other

## 2016-08-14 DIAGNOSIS — Z7901 Long term (current) use of anticoagulants: Secondary | ICD-10-CM | POA: Diagnosis not present

## 2016-08-14 DIAGNOSIS — Z9889 Other specified postprocedural states: Secondary | ICD-10-CM | POA: Diagnosis not present

## 2016-08-14 LAB — POCT INR: INR: 2.1

## 2016-08-26 ENCOUNTER — Other Ambulatory Visit: Payer: Self-pay | Admitting: Pharmacist

## 2016-08-26 MED ORDER — WARFARIN SODIUM 2 MG PO TABS: ORAL_TABLET | ORAL | 1 refills | Status: DC

## 2016-08-28 ENCOUNTER — Ambulatory Visit (INDEPENDENT_AMBULATORY_CARE_PROVIDER_SITE_OTHER): Payer: Medicare Other

## 2016-08-28 DIAGNOSIS — Z9889 Other specified postprocedural states: Secondary | ICD-10-CM | POA: Diagnosis not present

## 2016-08-28 DIAGNOSIS — Z7901 Long term (current) use of anticoagulants: Secondary | ICD-10-CM | POA: Diagnosis not present

## 2016-08-28 LAB — POCT INR: INR: 2.1

## 2016-08-28 MED ORDER — WARFARIN SODIUM 2 MG PO TABS: ORAL_TABLET | ORAL | 3 refills | Status: DC

## 2016-09-04 ENCOUNTER — Other Ambulatory Visit: Payer: Self-pay | Admitting: Family Medicine

## 2016-09-04 DIAGNOSIS — I480 Paroxysmal atrial fibrillation: Secondary | ICD-10-CM

## 2016-09-04 DIAGNOSIS — I5032 Chronic diastolic (congestive) heart failure: Secondary | ICD-10-CM

## 2016-09-04 DIAGNOSIS — K851 Biliary acute pancreatitis without necrosis or infection: Secondary | ICD-10-CM

## 2016-09-04 DIAGNOSIS — R5381 Other malaise: Secondary | ICD-10-CM

## 2016-09-04 MED ORDER — COLCHICINE 0.6 MG PO TABS: 0.6000 mg | ORAL_TABLET | Freq: Every day | ORAL | 5 refills | Status: DC

## 2016-09-06 ENCOUNTER — Encounter: Payer: Self-pay | Admitting: Family Medicine

## 2016-09-06 ENCOUNTER — Ambulatory Visit (INDEPENDENT_AMBULATORY_CARE_PROVIDER_SITE_OTHER): Payer: Medicare Other | Admitting: Family Medicine

## 2016-09-06 VITALS — BP 120/72 | HR 70 | Temp 97.9°F | Resp 20 | Ht 67.0 in | Wt 201.0 lb

## 2016-09-06 DIAGNOSIS — J209 Acute bronchitis, unspecified: Secondary | ICD-10-CM

## 2016-09-06 MED ORDER — AZITHROMYCIN 250 MG PO TABS: ORAL_TABLET | ORAL | 0 refills | Status: DC

## 2016-09-06 NOTE — Progress Notes (Signed)
   Subjective:    Patient ID: [name], male    DOB: 04-08-1929, 83 y.o.   MRN: [id number]  Patient presents for Cough   Cough with production for the past few weeks. States it has been present since he left the hospital in Dec. Feels weak but does not feel sick. No sick contacts at home. No vomiting, no diarrhea, no CP, no wheezing gets a little SOB   Hears ratteling in chest  Has not taken any cough medicine  Also request handicap placard      Review Of Systems:  GEN- denies fatigue, fever, weight loss,weakness, recent illness HEENT- denies eye drainage, change in vision, nasal discharge, CVS- denies chest pain, palpitations RESP- + SOB, +cough, wheeze ABD- denies N/V, change in stools, abd pain GU- denies dysuria, hematuria, dribbling, incontinence MSK- denies joint pain, muscle aches, injury Neuro- denies headache, dizziness, syncope, seizure activity       Objective:    BP 120/72 (BP Location: Left Arm, Patient Position: Sitting, Cuff Size: Normal)   Pulse 70   Temp 97.9 F (36.6 C) (Oral)   Resp 20   Ht 5\' 7"  (1.702 m)   Wt 201 lb (91.2 kg)   SpO2 94%   BMI 31.48 kg/m  GEN- NAD, alert and oriented x3 HEENT- PERRL, EOMI, non injected sclera, pink conjunctiva, MMM, oropharynx clear Neck- Supple, no LAD CVS- RRR, systolic murmur  RESP-Clear with mild upper airway congestion, normal WOB  EXT- PEDAL edema Pulses- Radial, 2+        Assessment & Plan:      Problem List Items Addressed This Visit    None    Visit Diagnoses    Acute bronchitis, unspecified organism    -  Primary   Concern for prolonged symptoms, no fever but elderly may not run in some cases. He was also recently hospitalized. Will go ahead and treat with zpak, coricidan,He appears stable to treat as an outpatient. Cardiac-wise he seems compensated. He will follow-up in one week as he is on Coumadin therapy and will recheck his INR. If not improved at that time, CXR to be done       Note: This dictation was prepared with Dragon dictation along with smaller phrase technology. Any transcriptional errors that result from this process are unintentional.

## 2016-09-06 NOTE — Patient Instructions (Signed)
Take cough medicine and antibiotics Return for recheck in 1 week and coumadin check

## 2016-09-13 ENCOUNTER — Encounter: Payer: Self-pay | Admitting: Family Medicine

## 2016-09-13 ENCOUNTER — Other Ambulatory Visit: Payer: Self-pay | Admitting: *Deleted

## 2016-09-13 ENCOUNTER — Ambulatory Visit: Payer: Self-pay | Admitting: *Deleted

## 2016-09-13 ENCOUNTER — Ambulatory Visit (INDEPENDENT_AMBULATORY_CARE_PROVIDER_SITE_OTHER): Payer: Medicare Other | Admitting: Family Medicine

## 2016-09-13 VITALS — BP 120/68 | HR 78 | Temp 98.6°F | Resp 18 | Ht 67.0 in | Wt 196.0 lb

## 2016-09-13 DIAGNOSIS — R05 Cough: Secondary | ICD-10-CM

## 2016-09-13 DIAGNOSIS — I480 Paroxysmal atrial fibrillation: Secondary | ICD-10-CM

## 2016-09-13 DIAGNOSIS — R059 Cough, unspecified: Secondary | ICD-10-CM

## 2016-09-13 LAB — PT WITH INR/FINGERSTICK
INR, fingerstick: 2 — ABNORMAL HIGH (ref 0.80–1.20)
PT, fingerstick: 23.5 seconds — ABNORMAL HIGH (ref 10.4–12.5)

## 2016-09-13 NOTE — Patient Outreach (Signed)
Phone call complete- discuss rescheduling home visit today as to see Dr. Dennard Schaumann this afternoon.   Spoke with pt, HIPAA/identity verified.  Pt reports he saw MD (Dr. Buelah Manis per view in Epic) for cold in chest last week, was given antibiotic which he completed, cold gone.  Pt reports was told to follow up with Dr. Dennard Schaumann today to make sure cold cleared up.   Pt reports taking all of his medications, weights have been 190 lbs all week, no edema.  Pt reports been staying indoors, hard to breath in cold weather.    Plan:  As discussed with pt, plan to follow up again in 10 days (home visit).     Zara Chess.   [place] [facility] Management  [phone]

## 2016-09-13 NOTE — Progress Notes (Signed)
Subjective:    Patient ID: [name], male    DOB: [birth year], 83 y.o.   MRN: [id number]  HPI Was seen 09/06/16 and started on z-pack for bronchitis.  Here to recheck cough and INR.  On coumadin 5 mg Sun/Wed/Frid and 4 mg on all other days.  INR today is 2.0  Patient states that cough is completely gone.  Denies fever or sob or pleurisy/cp.  Wt Readings from Last 3 Encounters:  09/13/16 196 lb (88.9 kg)  09/06/16 201 lb (91.2 kg)  07/26/16 186 lb (84.4 kg)   Past Medical History:  Diagnosis Date  . Chronic diastolic (congestive) heart failure    a. 10/2013 Echo: EF 55-60%, basal-mid anteroseptal and basal-mid inferoseptal HK.  . Chronic fatigue   . Chronic subdural hematoma ([place])    a. 12/2015 Head CT: chronic right holo-hemispheric SDH w/o midline shift.  . Coronary artery disease    a. 11/2014 NSTEMI: DESx 2 placed to LAD and RCA; b. 08/2015 Cath: LAD patent stent, LCX 20m, RCA patent stent, 41m.  . Diabetes mellitus, type II ([place])    a. HgA1c 6.8 in 03/2015  . ED (erectile dysfunction)   . Gout   . Hemangioma of liver 08/02/2015   12 mm enhancing lesion seen on CT - suspicious for benign hemangioma but not diagnostic - MRI recommended  . HLD (hyperlipidemia)   . Hypertensive heart disease   . Kidney stones   . Lower extremity edema    a. chronic  . OA (osteoarthritis)   . Orthostatic hypotension   . PAF (paroxysmal atrial fibrillation) ([place])    a. 10/2015 s/p DCCV;  b. CHA2DS2VASc = 6--> on coumadin; c. 10/2015 Echo: sev dil LA.  Marland Kitchen Physical deconditioning   . Prostate cancer ([place])   . Pulmonary HTN   . Pulmonary hypertension    a. 10/2015 Echo: PASP 54mmHg.  Marland Kitchen Severe mitral regurgitation    a.  10/2015 s/p minimally invasive MV repair; b. 10/2015 Echo: mod MS, mean grad 63mmHg.  Marland Kitchen Spinal stenosis   . SVT (supraventricular tachycardia) (HCC)    a. recurrent, usually responsive to vagal manuevers  . Tricuspid regurgitation    a. 10/2015 Echo: mild to mod TR.   Past  Surgical History:  Procedure Laterality Date  . BACK SURGERY    . Back surgery x 2    . CARDIAC CATHETERIZATION N/A 08/08/2015   Procedure: Right/Left Heart Cath and Coronary Angiography;  Surgeon: Sherren Mocha, MD;  Location: [facility];  Service: Cardiovascular;  Laterality: N/A;  . CARDIAC SURGERY     2 cardiac stents  . CARDIOVERSION N/A 10/20/2015   Procedure: CARDIOVERSION;  Surgeon: Pixie Casino, MD;  Location: [facility];  Service: Cardiovascular;  Laterality: N/A;  . CERVICAL SPINE SURGERY    . CHOLECYSTECTOMY N/A 07/08/2016   Procedure: LAPAROSCOPIC CHOLECYSTECTOMY WITH INTRAOPERATIVE CHOLANGIOGRAM;  Surgeon: Georganna Skeans, MD;  Location: [facility];  Service: General;  Laterality: N/A;  . EYE SURGERY    . INTRAOPERATIVE TRANSESOPHAGEAL ECHOCARDIOGRAM  10/05/2015   Procedure: INTRAOPERATIVE TRANSESOPHAGEAL ECHOCARDIOGRAM;  Surgeon: Rexene Alberts, MD;  Location: [facility] OR;  Service: Open Heart Surgery;;  . JOINT REPLACEMENT     shoulder  . LEFT HEART CATHETERIZATION WITH CORONARY ANGIOGRAM N/A 11/16/2014   Procedure: LEFT HEART CATHETERIZATION WITH CORONARY ANGIOGRAM;  Surgeon: Troy Sine, MD;  Location: [facility] CATH LAB;  Service: Cardiovascular;  Laterality: N/A;  . MITRAL VALVE REPAIR Right 10/04/2015   Procedure: MINIMALLY  INVASIVE MITRAL VALVE REPAIR (MVR);  Surgeon: Rexene Alberts, MD;  Location: [facility];  Service: Open Heart Surgery;  Laterality: Right;  . Rt rotator cuff repair    . TEE WITHOUT CARDIOVERSION N/A 06/22/2015   Procedure: TRANSESOPHAGEAL ECHOCARDIOGRAM (TEE);  Surgeon: Skeet Latch, MD;  Location: [facility];  Service: Cardiovascular;  Laterality: N/A;  . TEE WITHOUT CARDIOVERSION N/A 10/04/2015   Procedure: TRANSESOPHAGEAL ECHOCARDIOGRAM (TEE);  Surgeon: Rexene Alberts, MD;  Location: [facility];  Service: Open Heart Surgery;  Laterality: N/A;  . WOUND EXPLORATION N/A 10/05/2015   Procedure: Sternal Incision;  Surgeon: Rexene Alberts, MD;  Location: [facility];   Service: Open Heart Surgery;  Laterality: N/A;   Current Outpatient Prescriptions on File Prior to Visit  Medication Sig Dispense Refill  . allopurinol (ZYLOPRIM) 100 MG tablet Take 1 tablet (100 mg total) by mouth daily. 30 tablet 5  . aspirin EC 81 MG tablet Take 81 mg by mouth every other day. Reported on 12/29/2015    . colchicine 0.6 MG tablet Take 1 tablet (0.6 mg total) by mouth daily. 30 tablet 5  . finasteride (PROSCAR) 5 MG tablet TAKE ONE (1) TABLET EACH DAY 30 tablet 11  . furosemide (LASIX) 40 MG tablet Take 1 tablet (40 mg total) by mouth 2 (two) times daily. 60 tablet 3  . magnesium oxide (MAG-OX) 400 MG tablet Take 1 tablet (400 mg total) by mouth daily. 30 tablet 0  . metoprolol tartrate (LOPRESSOR) 25 MG tablet Take 0.5 tablets (12.5 mg total) by mouth 2 (two) times daily. 60 tablet 3  . OVER THE COUNTER MEDICATION Neuroquell: Take 1 capsule by mouth once a day for immune health    . potassium chloride (KLOR-CON) 20 MEQ packet Take 20 mEq by mouth daily. 30 packet 0  . pravastatin (PRAVACHOL) 40 MG tablet Take 1 tablet (40 mg total) by mouth daily at 6 PM. 30 tablet 11  . warfarin (COUMADIN) 2 MG tablet Take as directed by [facility] 70 tablet 3  . Wheat Dextrin (BENEFIBER) POWD Take by mouth See admin instructions. Mix one teaspoonful into 6-8 ounces of fluid/milk and drink once daily     No current facility-administered medications on file prior to visit.    No Known Allergies Social History   Social History  . Marital status: Married    Spouse name: N/A  . Number of children: 4  . Years of education: N/A   Occupational History  .  Retired   Social History Main Topics  . Smoking status: Former Smoker    Types: Cigarettes, Pipe, Landscape architect  . Smokeless tobacco: Former Systems developer    Types: Chew     Comment: [place] YEARS AGO"  . Alcohol use No  . Drug use: No  . Sexual activity: Not on file   Other Topics Concern  . Not on file   Social History  Narrative   ** Merged History Encounter **       Four adopted children.  Lives alone.       Review of Systems  All other systems reviewed and are negative.      Objective:   Physical Exam  Cardiovascular: Normal rate and regular rhythm.   Murmur heard. Pulmonary/Chest: Effort normal. No respiratory distress. He has no wheezes. He has rales.  Abdominal: Soft. Bowel sounds are normal.  Vitals reviewed.         Assessment & Plan:  PAF (paroxysmal atrial fibrillation) (HCC) -  Plan: PT with INR/Fingerstick  Cough - Plan: DG Chest 2 View  INR is therapeutic. There is no reason to change his dose of Coumadin. Follow-up as previously planned with the anticoagulation clinic at his cardiologist office did clinically the patient has completely improved. However on his exam today there are prominent right basilar Rales. Although the patient is asymptomatic, I have never appreciated these before his previous exam. Therefore I've asked the patient go get a chest x-ray just to evaluate further

## 2016-09-16 ENCOUNTER — Ambulatory Visit
Admission: RE | Admit: 2016-09-16 | Discharge: 2016-09-16 | Disposition: A | Discharge status: Home / Self Care | Payer: Medicare Other | Source: Ambulatory Visit | Attending: Family Medicine | Admitting: Family Medicine

## 2016-09-16 DIAGNOSIS — R918 Other nonspecific abnormal finding of lung field: Secondary | ICD-10-CM | POA: Diagnosis not present

## 2016-09-16 DIAGNOSIS — R059 Cough, unspecified: Secondary | ICD-10-CM

## 2016-09-16 DIAGNOSIS — R05 Cough: Secondary | ICD-10-CM | POA: Diagnosis not present

## 2016-09-16 NOTE — Telephone Encounter (Signed)
Would you please contact the patient and ask him if he has seen anymore blood in his urine and if he would come in for an UA to check for microscopic hematuria.

## 2016-09-16 NOTE — Telephone Encounter (Signed)
Spoke with pt in reference to hematuria. Pt stated that he has not seen anymore blood and declined to come in for a lab visit for u/a. Pt stated he was at the PCP on Friday and was told there was no more blood in urine.

## 2016-09-17 ENCOUNTER — Encounter: Payer: Self-pay | Admitting: Family Medicine

## 2016-09-23 ENCOUNTER — Encounter: Payer: Self-pay | Admitting: *Deleted

## 2016-09-23 ENCOUNTER — Other Ambulatory Visit: Payer: Self-pay | Admitting: *Deleted

## 2016-09-23 NOTE — Patient Outreach (Signed)
[facility]) Care Management   09/23/2016  ZION TA 1929/06/09 [id number]  [name] is an 83 y.o. male  Subjective:   Pt reports on recent visit with Dr. Dennard Schaumann (follow up on cold),cold gone.  Pt  Reports  x ray done with results  showing scar tissue.   Pt reports INR at MD office was 2.5, per  Spouse to have it rechecked this week.  Pt reports weight today 197.6 lbs, gained 3 lbs in the last 2 days, took an extra fluid pill today, urinated 4 times already, per spouse to take extra  Fluid pill tonight.   Spouse reports pt's weights have  been stable until today, ate more yesterday (ice cream).  Pt reports no sob, chest pain.      Objective:  Vitals:   09/23/16 1432  BP: 110/64  Pulse: 60  Resp: (!) 24    ROS  Physical Exam  Constitutional: He is oriented to person, place, and time. He appears well-developed and well-nourished.  Cardiovascular: Normal rate, regular rhythm and normal heart sounds.   Respiratory: Effort normal and breath sounds normal.  GI: Soft.  Musculoskeletal: Normal range of motion. He exhibits edema.  Trace edema to bilateral lower legs/ankles   Neurological: He is alert and oriented to person, place, and time.  Skin: Skin is warm and dry.  Psychiatric: He has a normal mood and affect. His behavior is normal. Judgment and thought content normal.    Encounter Medications:   Outpatient Encounter Prescriptions as of 09/23/2016  Medication Sig Note  . allopurinol (ZYLOPRIM) 100 MG tablet Take 1 tablet (100 mg total) by mouth daily.   Marland Kitchen aspirin EC 81 MG tablet Take 81 mg by mouth every other day. Reported on 12/29/2015 09/23/2016: Per spouse takes every other day   . colchicine 0.6 MG tablet Take 1 tablet (0.6 mg total) by mouth daily. 09/23/2016: Per spouse taking every other day   . finasteride (PROSCAR) 5 MG tablet TAKE ONE (1) TABLET EACH DAY   . furosemide (LASIX) 40 MG tablet Take 1 tablet (40 mg total) by mouth 2 (two) times  daily.   . magnesium oxide (MAG-OX) 400 MG tablet Take 1 tablet (400 mg total) by mouth daily.   . metoprolol tartrate (LOPRESSOR) 25 MG tablet Take 0.5 tablets (12.5 mg total) by mouth 2 (two) times daily.   . potassium chloride (KLOR-CON) 20 MEQ packet Take 20 mEq by mouth daily. 09/23/2016: Per spouse takes at night.   . pravastatin (PRAVACHOL) 40 MG tablet Take 1 tablet (40 mg total) by mouth daily at 6 PM.   . warfarin (COUMADIN) 2 MG tablet Take as directed by [facility]   . Wheat Dextrin (BENEFIBER) POWD Take by mouth See admin instructions. Mix one teaspoonful into 6-8 ounces of fluid/milk and drink once daily   . OVER THE COUNTER MEDICATION Neuroquell: Take 1 capsule by mouth once a day for immune health    No facility-administered encounter medications on file as of 09/23/2016.     Functional Status:   In your present state of health, do you have any difficulty performing the following activities: 07/23/2016 07/03/2016  Hearing? Tempie Donning  Vision? Y N  Difficulty concentrating or making decisions? N N  Walking or climbing stairs? N Y  Dressing or bathing? N N  Doing errands, shopping? N -  Preparing Food and eating ? Y -  Using the Toilet? N -  In the past six months, have  you accidently leaked urine? N -  Do you have problems with loss of bowel control? N -  Managing your Medications? N -  Managing your Finances? N -  Housekeeping or managing your Housekeeping? N -  Some recent data might be hidden    Fall/Depression Screening:    PHQ 2/9 Scores 09/13/2016 07/23/2016 12/01/2015 11/09/2015 04/26/2014 04/26/2014 11/16/2013  PHQ - 2 Score 0 0 0 0 0 2 0  PHQ- 9 Score - - - - 0 7 -    Assessment:  Pleasant 83 year old gentleman, resides with spouse (present during home visit) HF:  Lungs clear, no observed or reported sob, no c/o pain.  Trace edema to bilateral legs/ankles.         Per pt, took an extra water pill this am- having increased urination.             Plan:  As discussed  with pt/spouse, plan to discharge from community nurse case management            Services- goals met.           Case closure letter sent by in basket to Dr. Dennard Schaumann informing of discharge.             Plan to inform [facility] care management assistant to close case.    [facility] CM Care Plan Problem Two   Flowsheet Row Most Recent Value  Care Plan Problem Two  Hx of Congestive Heart Failure   Role Documenting the Problem Two  Care Management Coordinator  Care Plan for Problem Two  Active  [facility] Long Term Goal (31-90) days  Pt would have no issues with HF in the next 40 days   [facility] Long Term Goal Start Date  08/13/16  [facility] Long Term Goal Met Date  09/23/16  [facility] CM Short Term Goal #2 (0-30 days)  Pt would continue to be adherent with Low Na+ within the next 2 weeks   [facility] CM Short Term Goal #2 Start Date  09/13/16  [facility] CM Short Term Goal #2 Met Date  09/23/16     Zara Chess.   [place] Care Management  [phone]

## 2016-10-09 ENCOUNTER — Ambulatory Visit (INDEPENDENT_AMBULATORY_CARE_PROVIDER_SITE_OTHER): Payer: Medicare Other

## 2016-10-09 DIAGNOSIS — Z9889 Other specified postprocedural states: Secondary | ICD-10-CM | POA: Diagnosis not present

## 2016-10-09 DIAGNOSIS — Z7901 Long term (current) use of anticoagulants: Secondary | ICD-10-CM

## 2016-10-09 LAB — POCT INR: INR: 1.9

## 2016-10-21 ENCOUNTER — Encounter: Payer: Self-pay | Admitting: Thoracic Surgery (Cardiothoracic Vascular Surgery)

## 2016-10-21 ENCOUNTER — Ambulatory Visit (INDEPENDENT_AMBULATORY_CARE_PROVIDER_SITE_OTHER): Payer: Medicare Other | Admitting: Thoracic Surgery (Cardiothoracic Vascular Surgery)

## 2016-10-21 VITALS — BP 130/73 | HR 72 | Resp 20 | Ht 70.0 in | Wt 200.0 lb

## 2016-10-21 DIAGNOSIS — Z9889 Other specified postprocedural states: Secondary | ICD-10-CM | POA: Diagnosis not present

## 2016-10-21 DIAGNOSIS — I340 Nonrheumatic mitral (valve) insufficiency: Secondary | ICD-10-CM | POA: Diagnosis not present

## 2016-10-21 NOTE — Patient Instructions (Signed)

## 2016-10-21 NOTE — Progress Notes (Signed)
[address]       [place] [address]             [phone]     CARDIOTHORACIC SURGERY OFFICE NOTE  Referring Provider is [name], MD PCP is [name], MD   HPI:  Patient is an 83 year old male with complex past medical history who returns to the office today for follow-up approximately one year status post minimally invasive mitral valve repair on 10/04/2015 for severe symptomatic primary mitral regurgitation.  His immediate postoperative recovery at the time of surgery was notable for systolic anterior motion of the mitral valve with dynamic left ventricular outflow tract obstruction which resolved quickly by adjustment in the patient's medications including addition of a beta blocker. Follow-up echocardiogram performed 10/18/2015 revealed intact mitral valve repair with no residual mitral regurgitation or signs of systolic anterior motion.  He was last seen here in our office on 01/22/2016 at which time he was doing remarkably well. Since then he has been followed intermittently by Dr. Percival Spanish and he has remained very stable from a cardiac standpoint.  In November 2017 he was hospitalized with gallstone pancreatitis and he subsequently underwent laparoscopic cholecystectomy in early December. He recovered remarkably well.  He was last seen by Dr. Percival Spanish on 07/26/2016 and he returns to our office today for routine follow-up.  He has not had a follow-up echocardiogram performed since March 2017 but he continues to remain very stable from a cardiac standpoint. He is not very active  physically but he reports no significant shortness of breath. He has chronic lower extremity edema. He remains on oral Lasix but he reports that his weight is stable. He claims that he feels quite good and he denies any pain. He is looking forward to warmer whether this spring so he can get back out in his yard and plant his garden.   Current Outpatient Prescriptions  Medication Sig Dispense Refill  . allopurinol (ZYLOPRIM) 100 MG tablet Take 1 tablet (100 mg total) by mouth daily. 30 tablet 5  . aspirin EC 81 MG tablet Take 81 mg by mouth every other day. Reported on 12/29/2015    . colchicine 0.6 MG tablet Take 1 tablet (0.6 mg total) by mouth daily. 30 tablet 5  . finasteride (PROSCAR) 5 MG tablet TAKE ONE (1) TABLET EACH DAY 30 tablet 11  .  furosemide (LASIX) 40 MG tablet Take 1 tablet (40 mg total) by mouth 2 (two) times daily. 60 tablet 3  . magnesium oxide (MAG-OX) 400 MG tablet Take 1 tablet (400 mg total) by mouth daily. 30 tablet 0  . metoprolol tartrate (LOPRESSOR) 25 MG tablet Take 0.5 tablets (12.5 mg total) by mouth 2 (two) times daily. 60 tablet 3  . OVER THE COUNTER MEDICATION Neuroquell: Take 1 capsule by mouth once a day for immune health    . potassium chloride (KLOR-CON) 20 MEQ packet Take 20 mEq by mouth daily. 30 packet 0  . pravastatin (PRAVACHOL) 40 MG tablet Take 1 tablet (40 mg total) by mouth daily at 6 PM. 30 tablet 11  . warfarin (COUMADIN) 2 MG tablet Take as directed by [facility] 70 tablet 3  . Wheat Dextrin (BENEFIBER) POWD Take by mouth See admin instructions. Mix one teaspoonful into 6-8 ounces of fluid/milk and drink once daily     No current facility-administered medications for this visit.       Physical Exam:   BP 130/73   Pulse 72   Resp 20   Ht 5\' 10"  (1.778 m)   Wt 200 lb (90.7 kg)   SpO2 99% Comment: RA  BMI 28.70 kg/m   General:  Elderly but well-appearing  Chest:   Clear to auscultation  CV:   Regular rate and rhythm without  murmur  Incisions:  Completely healed  Abdomen:  Soft nontender  Extremities:  Warm well-perfused, moderate to severe lower extremity edema  Diagnostic Tests:  n/a   Impression:  The patient appears remarkably stable approximately 1 year status post minimally invasive mitral valve repair. He has stable symptoms of exertional shortness breath and lower extremity edema consistent with chronic diastolic congestive heart failure. He has not had any acute exacerbations and overall he reports that he feels improved in comparison with how he felt last year prior to surgery.   Plan:  We have not recommended any changes to the patient's current medications. The patient has been encouraged to continue to follow-up closely with Dr. Percival Spanish. The patient has been reminded regarding the importance of dental hygiene and the lifelong need for antibiotic prophylaxis for all dental cleanings and other related invasive procedures.  All of his questions have been addressed. In the future he will call and return to see [place] only should specific problems or questions arise.   I spent in excess of 15 minutes during the conduct of this office consultation and >50% of this time involved direct face-to-face encounter with the patient for counseling and/or coordination of their care.    Valentina Gu. Roxy Manns, MD 10/21/2016 1:32 PM

## 2016-10-24 ENCOUNTER — Ambulatory Visit (INDEPENDENT_AMBULATORY_CARE_PROVIDER_SITE_OTHER): Payer: Medicare Other | Admitting: Family Medicine

## 2016-10-24 ENCOUNTER — Encounter: Payer: Self-pay | Admitting: Family Medicine

## 2016-10-24 VITALS — BP 122/60 | HR 68 | Temp 97.8°F | Resp 18 | Ht 67.0 in | Wt 204.0 lb

## 2016-10-24 DIAGNOSIS — M109 Gout, unspecified: Secondary | ICD-10-CM

## 2016-10-24 MED ORDER — PREDNISONE 20 MG PO TABS: ORAL_TABLET | ORAL | 0 refills | Status: DC

## 2016-10-24 NOTE — Progress Notes (Signed)
Subjective:    Patient ID: [name], male    DOB: 31-Mar-1929, 83 y.o.   MRN: [id number]  HPI Patient is an 83 year old male with a history of gout. He reports the sudden onset of pain in his right ankle and right midfoot 2 days ago. This area is swollen. It is slightly warm to the touch. There is no edema. The pain is severe and out of proportion to exam. Simple palpation of the midfoot elicits severe pain. Pain is in keeping with his previous pain that he's experienced gout exacerbations in the past. He denies any puncture wound to the foot. There is no evidence of cellulitis. There is no obvious fluctuance. Past Medical History:  Diagnosis Date  . Atelectasis of right lung   . Chronic diastolic (congestive) heart failure    a. 10/2013 Echo: EF 55-60%, basal-mid anteroseptal and basal-mid inferoseptal HK.  . Chronic fatigue   . Chronic subdural hematoma ([place])    a. 12/2015 Head CT: chronic right holo-hemispheric SDH w/o midline shift.  . Coronary artery disease    a. 11/2014 NSTEMI: DESx 2 placed to LAD and RCA; b. 08/2015 Cath: LAD patent stent, LCX 59m, RCA patent stent, 77m.  . Diabetes mellitus, type II ([place])    a. HgA1c 6.8 in 03/2015  . ED (erectile dysfunction)   . Gout   . Hemangioma of liver 08/02/2015   12 mm enhancing lesion seen on CT - suspicious for benign hemangioma but not diagnostic - MRI recommended  . HLD (hyperlipidemia)   . Hypertensive heart disease   . Kidney stones   . Lower extremity edema    a. chronic  . OA (osteoarthritis)   . Orthostatic hypotension   . PAF (paroxysmal atrial fibrillation) ([place])    a. 10/2015 s/p DCCV;  b. CHA2DS2VASc = 6--> on coumadin; c. 10/2015 Echo: sev dil LA.  Marland Kitchen Physical deconditioning   . Prostate cancer ([place])   . Pulmonary HTN   . Pulmonary hypertension    a. 10/2015 Echo: PASP 72mmHg.  Marland Kitchen Severe mitral regurgitation    a.  10/2015 s/p minimally invasive MV repair; b. 10/2015 Echo: mod MS, mean grad 21mmHg.  Marland Kitchen Spinal stenosis    . SVT (supraventricular tachycardia) ([facility])    a. recurrent, usually responsive to vagal manuevers  . Tricuspid regurgitation    a. 10/2015 Echo: mild to mod TR.   Past Surgical History:  Procedure Laterality Date  . BACK SURGERY    . Back surgery x 2    . CARDIAC CATHETERIZATION N/A 08/08/2015   Procedure: Right/Left Heart Cath and Coronary Angiography;  Surgeon: Sherren Mocha, MD;  Location: [facility];  Service: Cardiovascular;  Laterality: N/A;  . CARDIAC SURGERY     2 cardiac stents  . CARDIOVERSION N/A 10/20/2015   Procedure: CARDIOVERSION;  Surgeon: Pixie Casino, MD;  Location: [place] [facility];  Service: Cardiovascular;  Laterality: N/A;  . CERVICAL SPINE SURGERY    . CHOLECYSTECTOMY N/A 07/08/2016   Procedure: LAPAROSCOPIC CHOLECYSTECTOMY WITH INTRAOPERATIVE CHOLANGIOGRAM;  Surgeon: Georganna Skeans, MD;  Location: [facility];  Service: General;  Laterality: N/A;  . EYE SURGERY    . INTRAOPERATIVE TRANSESOPHAGEAL ECHOCARDIOGRAM  10/05/2015   Procedure: INTRAOPERATIVE TRANSESOPHAGEAL ECHOCARDIOGRAM;  Surgeon: Rexene Alberts, MD;  Location: [facility] OR;  Service: Open Heart Surgery;;  . JOINT REPLACEMENT     shoulder  . LEFT HEART CATHETERIZATION WITH CORONARY ANGIOGRAM N/A 11/16/2014   Procedure: LEFT HEART CATHETERIZATION WITH CORONARY ANGIOGRAM;  Surgeon:  Troy Sine, MD;  Location: [facility] CATH LAB;  Service: Cardiovascular;  Laterality: N/A;  . MITRAL VALVE REPAIR Right 10/04/2015   Procedure: MINIMALLY INVASIVE MITRAL VALVE REPAIR (MVR);  Surgeon: Rexene Alberts, MD;  Location: [place];  Service: Open Heart Surgery;  Laterality: Right;  . Rt rotator cuff repair    . TEE WITHOUT CARDIOVERSION N/A 06/22/2015   Procedure: TRANSESOPHAGEAL ECHOCARDIOGRAM (TEE);  Surgeon: Skeet Latch, MD;  Location: [place];  Service: Cardiovascular;  Laterality: N/A;  . TEE WITHOUT CARDIOVERSION N/A 10/04/2015   Procedure: TRANSESOPHAGEAL ECHOCARDIOGRAM (TEE);  Surgeon: Rexene Alberts, MD;  Location:  [place];  Service: Open Heart Surgery;  Laterality: N/A;  . WOUND EXPLORATION N/A 10/05/2015   Procedure: Sternal Incision;  Surgeon: Rexene Alberts, MD;  Location: [place];  Service: Open Heart Surgery;  Laterality: N/A;   Current Outpatient Prescriptions on File Prior to Visit  Medication Sig Dispense Refill  . allopurinol (ZYLOPRIM) 100 MG tablet Take 1 tablet (100 mg total) by mouth daily. 30 tablet 5  . aspirin EC 81 MG tablet Take 81 mg by mouth every other day. Reported on 12/29/2015    . colchicine 0.6 MG tablet Take 1 tablet (0.6 mg total) by mouth daily. 30 tablet 5  . finasteride (PROSCAR) 5 MG tablet TAKE ONE (1) TABLET EACH DAY 30 tablet 11  . furosemide (LASIX) 40 MG tablet Take 1 tablet (40 mg total) by mouth 2 (two) times daily. 60 tablet 3  . magnesium oxide (MAG-OX) 400 MG tablet Take 1 tablet (400 mg total) by mouth daily. 30 tablet 0  . metoprolol tartrate (LOPRESSOR) 25 MG tablet Take 0.5 tablets (12.5 mg total) by mouth 2 (two) times daily. 60 tablet 3  . OVER THE COUNTER MEDICATION Neuroquell: Take 1 capsule by mouth once a day for immune health    . potassium chloride (KLOR-CON) 20 MEQ packet Take 20 mEq by mouth daily. 30 packet 0  . pravastatin (PRAVACHOL) 40 MG tablet Take 1 tablet (40 mg total) by mouth daily at 6 PM. 30 tablet 11  . warfarin (COUMADIN) 2 MG tablet Take as directed by [facility] 70 tablet 3  . Wheat Dextrin (BENEFIBER) POWD Take by mouth See admin instructions. Mix one teaspoonful into 6-8 ounces of fluid/milk and drink once daily     No current facility-administered medications on file prior to visit.    No Known Allergies Social History   Social History  . Marital status: Married    Spouse name: N/A  . Number of children: 4  . Years of education: N/A   Occupational History  .  Retired   Social History Main Topics  . Smoking status: Former Smoker    Types: Cigarettes, Pipe, Landscape architect  . Smokeless tobacco: Former Systems developer    Types: Chew      Comment: [place] YEARS AGO"  . Alcohol use No  . Drug use: No  . Sexual activity: Not on file   Other Topics Concern  . Not on file   Social History Narrative   ** Merged History Encounter **       Four adopted children.  Lives alone.       Review of Systems  All other systems reviewed and are negative.      Objective:   Physical Exam  Constitutional: He appears well-developed and well-nourished.  Cardiovascular: Normal rate.   Murmur heard. Pulmonary/Chest: Effort normal and breath sounds normal. No respiratory distress. He  has no wheezes. He has no rales.  Abdominal: Soft. Bowel sounds are normal. He exhibits no distension. There is no tenderness. There is no rebound.  Musculoskeletal: He exhibits edema.       Right ankle: He exhibits decreased range of motion and swelling. Tenderness.       Right foot: There is decreased range of motion, tenderness and bony tenderness.  Vitals reviewed.         Assessment & Plan:  Exacerbation of gout - Plan: predniSONE (DELTASONE) 20 MG tablet  Begin prednisone taper pack for possible gout exacerbation. Recheck next week if no better or sooner if worse

## 2016-11-06 ENCOUNTER — Ambulatory Visit (INDEPENDENT_AMBULATORY_CARE_PROVIDER_SITE_OTHER): Payer: Medicare Other

## 2016-11-06 DIAGNOSIS — Z7901 Long term (current) use of anticoagulants: Secondary | ICD-10-CM

## 2016-11-06 DIAGNOSIS — Z9889 Other specified postprocedural states: Secondary | ICD-10-CM | POA: Diagnosis not present

## 2016-11-06 LAB — POCT INR: INR: 2.4

## 2016-11-07 ENCOUNTER — Emergency Department (HOSPITAL_COMMUNITY): Payer: Medicare Other

## 2016-11-07 ENCOUNTER — Encounter (HOSPITAL_COMMUNITY): Payer: Self-pay

## 2016-11-07 ENCOUNTER — Inpatient Hospital Stay (HOSPITAL_COMMUNITY)
Admission: EM | Admit: 2016-11-07 | Discharge: 2016-11-11 | DRG: 083 | Disposition: A | Discharge status: Home Health | Payer: Medicare Other | Attending: General Surgery | Admitting: General Surgery

## 2016-11-07 DIAGNOSIS — Z79899 Other long term (current) drug therapy: Secondary | ICD-10-CM | POA: Diagnosis not present

## 2016-11-07 DIAGNOSIS — R40241 Glasgow coma scale score 13-15, unspecified time: Secondary | ICD-10-CM | POA: Diagnosis not present

## 2016-11-07 DIAGNOSIS — Z7901 Long term (current) use of anticoagulants: Secondary | ICD-10-CM

## 2016-11-07 DIAGNOSIS — Y9301 Activity, walking, marching and hiking: Secondary | ICD-10-CM | POA: Diagnosis present

## 2016-11-07 DIAGNOSIS — I5032 Chronic diastolic (congestive) heart failure: Secondary | ICD-10-CM | POA: Diagnosis present

## 2016-11-07 DIAGNOSIS — Z8249 Family history of ischemic heart disease and other diseases of the circulatory system: Secondary | ICD-10-CM

## 2016-11-07 DIAGNOSIS — R55 Syncope and collapse: Secondary | ICD-10-CM

## 2016-11-07 DIAGNOSIS — W1809XA Striking against other object with subsequent fall, initial encounter: Secondary | ICD-10-CM | POA: Diagnosis present

## 2016-11-07 DIAGNOSIS — M25511 Pain in right shoulder: Secondary | ICD-10-CM | POA: Diagnosis not present

## 2016-11-07 DIAGNOSIS — I4891 Unspecified atrial fibrillation: Secondary | ICD-10-CM | POA: Diagnosis present

## 2016-11-07 DIAGNOSIS — S2231XA Fracture of one rib, right side, initial encounter for closed fracture: Secondary | ICD-10-CM

## 2016-11-07 DIAGNOSIS — S01111A Laceration without foreign body of right eyelid and periocular area, initial encounter: Secondary | ICD-10-CM | POA: Diagnosis not present

## 2016-11-07 DIAGNOSIS — I2510 Atherosclerotic heart disease of native coronary artery without angina pectoris: Secondary | ICD-10-CM | POA: Diagnosis not present

## 2016-11-07 DIAGNOSIS — I629 Nontraumatic intracranial hemorrhage, unspecified: Secondary | ICD-10-CM | POA: Diagnosis not present

## 2016-11-07 DIAGNOSIS — S0501XA Injury of conjunctiva and corneal abrasion without foreign body, right eye, initial encounter: Secondary | ICD-10-CM | POA: Diagnosis not present

## 2016-11-07 DIAGNOSIS — I6203 Nontraumatic chronic subdural hemorrhage: Secondary | ICD-10-CM | POA: Diagnosis not present

## 2016-11-07 DIAGNOSIS — I252 Old myocardial infarction: Secondary | ICD-10-CM

## 2016-11-07 DIAGNOSIS — S066X0A Traumatic subarachnoid hemorrhage without loss of consciousness, initial encounter: Secondary | ICD-10-CM | POA: Diagnosis not present

## 2016-11-07 DIAGNOSIS — S065X9A Traumatic subdural hemorrhage with loss of consciousness of unspecified duration, initial encounter: Secondary | ICD-10-CM | POA: Diagnosis not present

## 2016-11-07 DIAGNOSIS — S065X0A Traumatic subdural hemorrhage without loss of consciousness, initial encounter: Secondary | ICD-10-CM | POA: Diagnosis not present

## 2016-11-07 DIAGNOSIS — E119 Type 2 diabetes mellitus without complications: Secondary | ICD-10-CM | POA: Diagnosis not present

## 2016-11-07 DIAGNOSIS — S066X9A Traumatic subarachnoid hemorrhage with loss of consciousness of unspecified duration, initial encounter: Secondary | ICD-10-CM | POA: Diagnosis not present

## 2016-11-07 DIAGNOSIS — R296 Repeated falls: Secondary | ICD-10-CM | POA: Diagnosis present

## 2016-11-07 DIAGNOSIS — S2239XA Fracture of one rib, unspecified side, initial encounter for closed fracture: Secondary | ICD-10-CM

## 2016-11-07 DIAGNOSIS — Z87891 Personal history of nicotine dependence: Secondary | ICD-10-CM | POA: Diagnosis not present

## 2016-11-07 DIAGNOSIS — S0181XA Laceration without foreign body of other part of head, initial encounter: Secondary | ICD-10-CM

## 2016-11-07 DIAGNOSIS — S065XAA Traumatic subdural hemorrhage with loss of consciousness status unknown, initial encounter: Secondary | ICD-10-CM

## 2016-11-07 DIAGNOSIS — I6200 Nontraumatic subdural hemorrhage, unspecified: Secondary | ICD-10-CM | POA: Diagnosis not present

## 2016-11-07 DIAGNOSIS — Z7982 Long term (current) use of aspirin: Secondary | ICD-10-CM | POA: Diagnosis not present

## 2016-11-07 DIAGNOSIS — S199XXA Unspecified injury of neck, initial encounter: Secondary | ICD-10-CM | POA: Diagnosis not present

## 2016-11-07 DIAGNOSIS — Z7952 Long term (current) use of systemic steroids: Secondary | ICD-10-CM

## 2016-11-07 DIAGNOSIS — S2241XA Multiple fractures of ribs, right side, initial encounter for closed fracture: Secondary | ICD-10-CM | POA: Diagnosis not present

## 2016-11-07 DIAGNOSIS — H1131 Conjunctival hemorrhage, right eye: Secondary | ICD-10-CM | POA: Diagnosis not present

## 2016-11-07 DIAGNOSIS — I609 Nontraumatic subarachnoid hemorrhage, unspecified: Secondary | ICD-10-CM

## 2016-11-07 DIAGNOSIS — Z8679 Personal history of other diseases of the circulatory system: Secondary | ICD-10-CM | POA: Diagnosis present

## 2016-11-07 LAB — BASIC METABOLIC PANEL
Anion gap: 9 (ref 5–15)
BUN: 22 mg/dL — AB (ref 6–20)
CHLORIDE: 98 mmol/L — AB (ref 101–111)
CO2: 30 mmol/L (ref 22–32)
Calcium: 8.9 mg/dL (ref 8.9–10.3)
Creatinine, Ser: 1.47 mg/dL — ABNORMAL HIGH (ref 0.61–1.24)
GFR calc Af Amer: 48 mL/min — ABNORMAL LOW (ref >60)
GFR calc non Af Amer: 42 mL/min — ABNORMAL LOW (ref >60)
Glucose, Bld: 115 mg/dL — ABNORMAL HIGH (ref 65–99)
POTASSIUM: 3.7 mmol/L (ref 3.5–5.1)
SODIUM: 137 mmol/L (ref 135–145)

## 2016-11-07 LAB — CBC WITH DIFFERENTIAL/PLATELET
Basophils Absolute: 0 10*3/uL (ref 0.0–0.1)
Basophils Relative: 1 %
EOS ABS: 0.2 10*3/uL (ref 0.0–0.7)
Eosinophils Relative: 3 %
HCT: 41.9 % (ref 39.0–52.0)
HEMOGLOBIN: 13.7 g/dL (ref 13.0–17.0)
LYMPHS ABS: 1.9 10*3/uL (ref 0.7–4.0)
LYMPHS PCT: 22 %
MCH: 30.2 pg (ref 26.0–34.0)
MCHC: 32.7 g/dL (ref 30.0–36.0)
MCV: 92.5 fL (ref 78.0–100.0)
Monocytes Absolute: 0.6 10*3/uL (ref 0.1–1.0)
Monocytes Relative: 7 %
NEUTROS ABS: 5.8 10*3/uL (ref 1.7–7.7)
Neutrophils Relative %: 67 %
Platelets: 141 10*3/uL — ABNORMAL LOW (ref 150–400)
RBC: 4.53 MIL/uL (ref 4.22–5.81)
RDW: 15.5 % (ref 11.5–15.5)
WBC: 8.5 10*3/uL (ref 4.0–10.5)

## 2016-11-07 LAB — PROTIME-INR
INR: 1.79
INR: 1.19
PROTHROMBIN TIME: 21 s — AB (ref 11.4–15.2)
Prothrombin Time: 15.1 s (ref 11.4–15.2)

## 2016-11-07 LAB — MRSA PCR SCREENING: MRSA by PCR: NEGATIVE

## 2016-11-07 MED ORDER — LIDOCAINE-EPINEPHRINE (PF) 2 %-1:200000 IJ SOLN
INTRAMUSCULAR | Status: AC
  Administered 2016-11-07: 20 mL via INTRADERMAL
  Filled 2016-11-07: qty 20

## 2016-11-07 MED ORDER — ONDANSETRON HCL 4 MG/2ML IJ SOLN: 4.0000 mg | Freq: Three times a day (TID) | INTRAMUSCULAR | Status: DC | PRN

## 2016-11-07 MED ORDER — VITAMIN K1 10 MG/ML IJ SOLN
10.0000 mg | INTRAVENOUS | Status: AC
  Administered 2016-11-07: 10 mg via INTRAVENOUS
  Filled 2016-11-07: qty 1

## 2016-11-07 MED ORDER — MAGNESIUM OXIDE 400 (241.3 MG) MG PO TABS
400.0000 mg | ORAL_TABLET | Freq: Every day | ORAL | Status: DC
  Administered 2016-11-08 – 2016-11-11 (×4): 400 mg via ORAL
  Filled 2016-11-07 (×4): qty 1

## 2016-11-07 MED ORDER — TETRACAINE HCL 0.5 % OP SOLN
1.0000 [drp] | Freq: Once | OPHTHALMIC | Status: AC
  Administered 2016-11-07: 1 [drp] via OPHTHALMIC
  Filled 2016-11-07: qty 4

## 2016-11-07 MED ORDER — LIDOCAINE-EPINEPHRINE (PF) 2 %-1:200000 IJ SOLN
20.0000 mL | Freq: Once | INTRAMUSCULAR | Status: AC
  Administered 2016-11-07: 20 mL via INTRADERMAL

## 2016-11-07 MED ORDER — FUROSEMIDE 40 MG PO TABS
40.0000 mg | ORAL_TABLET | Freq: Two times a day (BID) | ORAL | Status: DC
  Administered 2016-11-07 – 2016-11-11 (×8): 40 mg via ORAL
  Filled 2016-11-07 (×9): qty 1

## 2016-11-07 MED ORDER — ONDANSETRON HCL 4 MG/2ML IJ SOLN: 4.0000 mg | Freq: Four times a day (QID) | INTRAMUSCULAR | Status: DC | PRN

## 2016-11-07 MED ORDER — MORPHINE SULFATE (PF) 4 MG/ML IV SOLN
4.0000 mg | Freq: Once | INTRAVENOUS | Status: DC
  Filled 2016-11-07 (×2): qty 1

## 2016-11-07 MED ORDER — LIDOCAINE-EPINEPHRINE 1 %-1:100000 IJ SOLN: 20.0000 mL | Freq: Once | INTRAMUSCULAR | Status: DC

## 2016-11-07 MED ORDER — POTASSIUM CHLORIDE IN NACL 20-0.9 MEQ/L-% IV SOLN
INTRAVENOUS | Status: DC
  Administered 2016-11-07: 18:00:00 via INTRAVENOUS
  Filled 2016-11-07: qty 1000

## 2016-11-07 MED ORDER — MORPHINE SULFATE (PF) 4 MG/ML IV SOLN
4.0000 mg | Freq: Once | INTRAVENOUS | Status: AC
  Administered 2016-11-07: 4 mg via INTRAMUSCULAR

## 2016-11-07 MED ORDER — MORPHINE SULFATE (PF) 2 MG/ML IV SOLN
2.0000 mg | INTRAVENOUS | Status: DC | PRN
  Administered 2016-11-07 – 2016-11-08 (×5): 2 mg via INTRAVENOUS
  Filled 2016-11-07 (×5): qty 1

## 2016-11-07 MED ORDER — POTASSIUM CHLORIDE 20 MEQ PO PACK
20.0000 meq | PACK | Freq: Every day | ORAL | Status: DC
  Administered 2016-11-08 – 2016-11-11 (×4): 20 meq via ORAL
  Filled 2016-11-07 (×4): qty 1

## 2016-11-07 MED ORDER — IPRATROPIUM-ALBUTEROL 0.5-2.5 (3) MG/3ML IN SOLN: 3.0000 mL | Freq: Four times a day (QID) | RESPIRATORY_TRACT | Status: DC | PRN

## 2016-11-07 MED ORDER — FINASTERIDE 5 MG PO TABS
5.0000 mg | ORAL_TABLET | Freq: Every day | ORAL | Status: DC
  Administered 2016-11-08 – 2016-11-11 (×4): 5 mg via ORAL
  Filled 2016-11-07 (×4): qty 1

## 2016-11-07 MED ORDER — PANTOPRAZOLE SODIUM 40 MG PO TBEC
40.0000 mg | DELAYED_RELEASE_TABLET | Freq: Every day | ORAL | Status: DC
  Administered 2016-11-07 – 2016-11-11 (×4): 40 mg via ORAL
  Filled 2016-11-07 (×5): qty 1

## 2016-11-07 MED ORDER — ALLOPURINOL 100 MG PO TABS
100.0000 mg | ORAL_TABLET | Freq: Every day | ORAL | Status: DC
  Administered 2016-11-08 – 2016-11-11 (×4): 100 mg via ORAL
  Filled 2016-11-07 (×4): qty 1

## 2016-11-07 MED ORDER — COLCHICINE 0.6 MG PO TABS
0.6000 mg | ORAL_TABLET | Freq: Every day | ORAL | Status: DC
  Administered 2016-11-08 – 2016-11-11 (×4): 0.6 mg via ORAL
  Filled 2016-11-07 (×4): qty 1

## 2016-11-07 MED ORDER — METOPROLOL TARTRATE 12.5 MG HALF TABLET
12.5000 mg | ORAL_TABLET | Freq: Two times a day (BID) | ORAL | Status: DC
  Administered 2016-11-07 – 2016-11-11 (×8): 12.5 mg via ORAL
  Filled 2016-11-07 (×8): qty 1

## 2016-11-07 MED ORDER — ACETAMINOPHEN 325 MG PO TABS
650.0000 mg | ORAL_TABLET | ORAL | Status: DC | PRN
  Administered 2016-11-11: 650 mg via ORAL
  Filled 2016-11-07: qty 2

## 2016-11-07 MED ORDER — TRAMADOL HCL 50 MG PO TABS
50.0000 mg | ORAL_TABLET | Freq: Four times a day (QID) | ORAL | Status: DC | PRN
  Administered 2016-11-08 – 2016-11-11 (×8): 50 mg via ORAL
  Filled 2016-11-07 (×8): qty 1

## 2016-11-07 MED ORDER — ONDANSETRON HCL 4 MG PO TABS: 4.0000 mg | ORAL_TABLET | Freq: Four times a day (QID) | ORAL | Status: DC | PRN

## 2016-11-07 MED ORDER — PROTHROMBIN COMPLEX CONC HUMAN 500 UNITS IV KIT
2500.0000 [IU] | PACK | Status: DC
  Filled 2016-11-07: qty 20

## 2016-11-07 MED ORDER — PANTOPRAZOLE SODIUM 40 MG IV SOLR
40.0000 mg | Freq: Every day | INTRAVENOUS | Status: DC
  Administered 2016-11-08: 40 mg via INTRAVENOUS
  Filled 2016-11-07: qty 40

## 2016-11-07 MED ORDER — FLUORESCEIN SODIUM 0.6 MG OP STRP
1.0000 | ORAL_STRIP | Freq: Once | OPHTHALMIC | Status: AC
Start: 2016-11-07 — End: 2016-11-07
  Administered 2016-11-07: 1 via OPHTHALMIC
  Filled 2016-11-07: qty 1

## 2016-11-07 NOTE — ED Provider Notes (Signed)
[facility] DEPT Provider Note   CSN: [id number] Arrival date & time: 11/07/16  1119   By signing my name below, I, [name], attest that this documentation has been prepared under the direction and in the presence of Fred Chapel, MD. Electronically Signed: Hilbert Bradshaw, Scribe. 11/07/16. 12:07 PM. History   Chief Complaint Chief Complaint  Patient presents with  . Laceration  . Shoulder Pain    The history is provided by the patient and a relative. No language interpreter was used.  HPI Comments: [name] is a 83 y.o. male who presents to the Emergency Department complaining of right facial laceration and right shoulder pain that occurred prior to his arrival. The patient doesn't remember falling. Per family: the patient was walking out of the doorway and fell onto the porch. They states that he hit his head on the wooden porch. They suspect that the patient got caught on the rug as he was walking out of the house however the fall itself was unwitnessed. Patient was "talking out of his head"  Per family. He is currently on Coumadin. The patient states that he had a tetanus shot last year. He denies LOC.   Past Medical History:  Diagnosis Date  . Atelectasis of right lung   . Chronic diastolic (congestive) heart failure    a. 10/2013 Echo: EF 55-60%, basal-mid anteroseptal and basal-mid inferoseptal HK.  . Chronic fatigue   . Chronic subdural hematoma ([employer])    a. 12/2015 Head CT: chronic right holo-hemispheric SDH w/o midline shift.  . Coronary artery disease    a. 11/2014 NSTEMI: DESx 2 placed to LAD and RCA; b. 08/2015 Cath: LAD patent stent, LCX 53m, RCA patent stent, 74m.  . Diabetes mellitus, type II ([place])    a. HgA1c 6.8 in 03/2015  . ED (erectile dysfunction)   . Gout   . Hemangioma of liver 08/02/2015   12 mm enhancing lesion seen on CT - suspicious for benign hemangioma but not diagnostic - MRI recommended  . HLD (hyperlipidemia)   . Hypertensive heart  disease   . Kidney stones   . Lower extremity edema    a. chronic  . OA (osteoarthritis)   . Orthostatic hypotension   . PAF (paroxysmal atrial fibrillation) ([place])    a. 10/2015 s/p DCCV;  b. CHA2DS2VASc = 6--> on coumadin; c. 10/2015 Echo: sev dil LA.  Marland Kitchen Physical deconditioning   . Prostate cancer ([place])   . Pulmonary HTN   . Pulmonary hypertension    a. 10/2015 Echo: PASP 19mmHg.  Marland Kitchen Severe mitral regurgitation    a.  10/2015 s/p minimally invasive MV repair; b. 10/2015 Echo: mod MS, mean grad 76mmHg.  Marland Kitchen Spinal stenosis   . SVT (supraventricular tachycardia) ([facility])    a. recurrent, usually responsive to vagal manuevers  . Tricuspid regurgitation    a. 10/2015 Echo: mild to mod TR.    Patient Active Problem List   Diagnosis Date Noted  . Chronic diastolic CHF (congestive heart failure) ([place]) 07/03/2016  . Gallstone pancreatitis 07/03/2016  . Hypertensive heart disease   . Syncope 12/11/2015  . Orthostatic hypotension   . Weakness 12/09/2015  . PAF (paroxysmal atrial fibrillation) ([place])   . CAD (coronary artery disease)   . Pressure ulcer 10/19/2015  . Long term (current) use of anticoagulants [Z79.01] 10/17/2015  . S/P minimally invasive mitral valve repair 10/04/2015  . Physical deconditioning   . Hemangioma of liver 08/02/2015  . Diabetes mellitus, type II ([place])   .  Coronary artery disease   . Pulmonary HTN   . Lower extremity edema   . History of PSVT (paroxysmal supraventricular tachycardia) 11/15/2014  . Dyslipidemia 11/15/2014  . History of prostate cancer 11/15/2014  . Dry eye 09/03/2013  . TBI (traumatic brain injury) ([place]) 11/25/2012  . Orbit fracture ([place]) 11/25/2012  . Maxillary sinus fracture ([place]) 11/25/2012  . Frequent falls 11/25/2012  . OA (osteoarthritis)   . Metabolic syndrome   . ED (erectile dysfunction)   . Gout   . Spinal stenosis   . Hypertension 05/07/2012    Past Surgical History:  Procedure Laterality Date  . BACK SURGERY    . Back surgery  x 2    . CARDIAC CATHETERIZATION N/A 08/08/2015   Procedure: Right/Left Heart Cath and Coronary Angiography;  Surgeon: Sherren Mocha, MD;  Location: [facility];  Service: Cardiovascular;  Laterality: N/A;  . CARDIAC SURGERY     2 cardiac stents  . CARDIOVERSION N/A 10/20/2015   Procedure: CARDIOVERSION;  Surgeon: Pixie Casino, MD;  Location: [facility];  Service: Cardiovascular;  Laterality: N/A;  . CERVICAL SPINE SURGERY    . CHOLECYSTECTOMY N/A 07/08/2016   Procedure: LAPAROSCOPIC CHOLECYSTECTOMY WITH INTRAOPERATIVE CHOLANGIOGRAM;  Surgeon: Georganna Skeans, MD;  Location: [facility];  Service: General;  Laterality: N/A;  . EYE SURGERY    . INTRAOPERATIVE TRANSESOPHAGEAL ECHOCARDIOGRAM  10/05/2015   Procedure: INTRAOPERATIVE TRANSESOPHAGEAL ECHOCARDIOGRAM;  Surgeon: Rexene Alberts, MD;  Location: [facility];  Service: Open Heart Surgery;;  . JOINT REPLACEMENT     shoulder  . LEFT HEART CATHETERIZATION WITH CORONARY ANGIOGRAM N/A 11/16/2014   Procedure: LEFT HEART CATHETERIZATION WITH CORONARY ANGIOGRAM;  Surgeon: Troy Sine, MD;  Location: [facility] CATH LAB;  Service: Cardiovascular;  Laterality: N/A;  . MITRAL VALVE REPAIR Right 10/04/2015   Procedure: MINIMALLY INVASIVE MITRAL VALVE REPAIR (MVR);  Surgeon: Rexene Alberts, MD;  Location: [facility];  Service: Open Heart Surgery;  Laterality: Right;  . Rt rotator cuff repair    . TEE WITHOUT CARDIOVERSION N/A 06/22/2015   Procedure: TRANSESOPHAGEAL ECHOCARDIOGRAM (TEE);  Surgeon: Skeet Latch, MD;  Location: [facility];  Service: Cardiovascular;  Laterality: N/A;  . TEE WITHOUT CARDIOVERSION N/A 10/04/2015   Procedure: TRANSESOPHAGEAL ECHOCARDIOGRAM (TEE);  Surgeon: Rexene Alberts, MD;  Location: [facility];  Service: Open Heart Surgery;  Laterality: N/A;  . WOUND EXPLORATION N/A 10/05/2015   Procedure: Sternal Incision;  Surgeon: Rexene Alberts, MD;  Location: [facility];  Service: Open Heart Surgery;  Laterality: N/A;       Home Medications     Prior to Admission medications   Medication Sig Start Date End Date Taking? Authorizing Provider  allopurinol (ZYLOPRIM) 100 MG tablet Take 1 tablet (100 mg total) by mouth daily. 08/08/16  Yes [name], MD  aspirin EC 81 MG tablet Take 81 mg by mouth every other day. Reported on 12/29/2015   Yes Historical Provider, MD  colchicine 0.6 MG tablet Take 1 tablet (0.6 mg total) by mouth daily. 09/04/16  Yes [name], MD  finasteride (PROSCAR) 5 MG tablet TAKE ONE (1) TABLET EACH DAY 04/10/16  Yes [name], MD  furosemide (LASIX) 40 MG tablet Take 1 tablet (40 mg total) by mouth 2 (two) times daily. 07/09/16  Yes Florencia Reasons, MD  magnesium oxide (MAG-OX) 400 MG tablet Take 1 tablet (400 mg total) by mouth daily. 07/09/16  Yes Florencia Reasons, MD  metoprolol tartrate (LOPRESSOR) 25 MG tablet Take 0.5 tablets (  12.5 mg total) by mouth 2 (two) times daily. 01/29/16  Yes [name], MD  OVER THE COUNTER MEDICATION Neuroquell: Take 1 capsule by mouth once a day for immune health   Yes Historical Provider, MD  potassium chloride (KLOR-CON) 20 MEQ packet Take 20 mEq by mouth daily. 07/09/16  Yes Florencia Reasons, MD  pravastatin (PRAVACHOL) 40 MG tablet Take 1 tablet (40 mg total) by mouth daily at 6 PM. 07/02/16  Yes [name], MD  predniSONE (DELTASONE) 20 MG tablet 3 tabs poqday 1-2, 2 tabs poqday 3-4, 1 tab poqday 5-6 10/24/16  Yes [name], MD  warfarin (COUMADIN) 2 MG tablet Take as directed by [facility] 08/28/16  Yes [name], MD  Wheat Dextrin (BENEFIBER) POWD Take by mouth See admin instructions. Mix one teaspoonful into 6-8 ounces of fluid/milk and drink once daily   Yes Historical Provider, MD    Family History Family History  Problem Relation Age of Onset  . Heart failure Mother 38  . Heart attack Mother   . Heart attack Brother     Social History Social History  Substance Use Topics  . Smoking status: Former Smoker    Types: Cigarettes, Pipe, Landscape architect  .  Smokeless tobacco: Former Systems developer    Types: Chew     Comment: [place] YEARS AGO"  . Alcohol use No     Allergies   Patient has no known allergies.   Review of Systems Review of Systems  Respiratory: Negative for cough and shortness of breath.   Cardiovascular: Negative for chest pain.  Gastrointestinal: Negative for abdominal pain.  Musculoskeletal: Negative for back pain.  Skin: Positive for wound.  Hematological: Bruises/bleeds easily.  Psychiatric/Behavioral: Positive for confusion.  All other systems reviewed and are negative.    Physical Exam Updated Vital Signs BP (!) 145/72 (BP Location: Left Arm)   Pulse 62   Temp 97.5 F (36.4 C) (Oral)   Resp (!) 21   Ht 5\' 10"  (1.778 m)   Wt 200 lb (90.7 kg)   SpO2 98%   BMI 28.70 kg/m   Physical Exam  Constitutional: He is oriented to person, place, and time. He appears well-developed and well-nourished. No distress.  HENT:  Head: Normocephalic and atraumatic.  4 cm laceration over right brow. Complete subconjunctival hemorrhage on the right with normal appearing pupils.  No hyphema or proptosis  On fluorescein exam there is a small anterior corneal abrasion but no fluid leakage, normal shaped pupil.  Eyes: Conjunctivae and EOM are normal.  Normal extraocular. bruising on periorbital right.  Neck: Neck supple. No tracheal deviation present.  Cardiovascular: Normal rate.   Murmur ( soft systolic) heard. Pulmonary/Chest: Effort normal. No respiratory distress. He exhibits tenderness ( over the R lateral chest wall).  Abdominal: Soft. Bowel sounds are normal. He exhibits no distension and no mass. There is no tenderness. There is no rebound and no guarding.  Musculoskeletal: Normal range of motion.  Full ROM. No leg length discrepancies. No edema.  Normal ROM of all extremities without deformity - compartments are soft, joints are supple  Neurological: He is alert and oriented to person, place, and time.  Follows  commands Lifts all 4 exztremities.  Skin: Skin is warm and dry.  Bruising to right chest wall just lateral to right nipple.  Psychiatric: He has a normal mood and affect. His behavior is normal.  Nursing note and vitals reviewed.  ED Treatments / Results  DIAGNOSTIC STUDIES:  Oxygen Saturation is 99% on RA, normal by my interpretation.    COORDINATION OF CARE: 12:03 PM Discussed treatment plan with pt at bedside and pt agreed to plan. I will check the patient's CT scans.  Labs (all labs ordered are listed, but only abnormal results are displayed) Labs Reviewed  CBC WITH DIFFERENTIAL/PLATELET - Abnormal; Notable for the following:       Result Value   Platelets 141 (*)    All other components within normal limits  BASIC METABOLIC PANEL - Abnormal; Notable for the following:    Chloride 98 (*)    Glucose, Bld 115 (*)    BUN 22 (*)    Creatinine, Ser 1.47 (*)    GFR calc non Af Amer 42 (*)    GFR calc Af Amer 48 (*)    All other components within normal limits  PROTIME-INR - Abnormal; Notable for the following:    Prothrombin Time 21.0 (*)    All other components within normal limits    EKG  EKG Interpretation  Date/Time:  Thursday November 07 2016 12:24:42 EDT Ventricular Rate:  67 PR Interval:    QRS Duration: 124 QT Interval:  488 QTC Calculation: 516 R Axis:   -62 Text Interpretation:  Sinus rhythm Nonspecific IVCD with LAD Anterior infarct, old Baseline wander in lead(s) I II since last tracing no significant change Confirmed by Edelmira Gallogly  MD, Ifeoma Vallin ([id number]) on 11/07/2016 12:28:10 PM       Radiology Dg Ribs Unilateral W/chest Right  Result Date: 11/07/2016 CLINICAL DATA:  Right rib cage pain and bruising following a fall. EXAM: RIGHT RIBS AND CHEST - 3+ VIEW COMPARISON:  Chest x-ray of September 16, 2016 FINDINGS: There is a persistent small right pleural effusion which appears stable. There is thickening of the minor fissure. There is no pneumothorax. The left lung is  clear. The cardiac silhouette is enlarged. The pulmonary vascularity is normal. There is calcification in the wall of the aortic arch. There is an acute fracture of the right fifth rib laterally. The adjacent ribs are intact. IMPRESSION: The patient has sustained an acute anterolateral right fifth rib fracture. There is a pre-existing small right pleural effusion there is no pneumothorax. Cardiomegaly without pulmonary edema. Thoracic aortic atherosclerosis. Electronically Signed   By: David  Martinique M.D.   On: 11/07/2016 13:26   Ct Head Wo Contrast  Result Date: 11/07/2016 CLINICAL DATA:  Status post fall today. Laceration above the right eye. Altered mental status. EXAM: CT HEAD WITHOUT CONTRAST CT MAXILLOFACIAL WITHOUT CONTRAST CT CERVICAL SPINE WITHOUT CONTRAST TECHNIQUE: Multidetector CT imaging of the head, cervical spine, and maxillofacial structures were performed using the standard protocol without intravenous contrast. Multiplanar CT image reconstructions of the cervical spine and maxillofacial structures were also generated. COMPARISON:  Head CT scan 12/31/2015. Head, face and cervical spine CT scans 11/23/2012. FINDINGS: CT HEAD FINDINGS Brain: Low attenuating collection over the right convexities consistent with a subdural hygroma measures up to approximately 1.4 cm in thickness with mass effect on the right cerebral convexities, unchanged. However, there is new subdural hemorrhage on the right adjacent to the right frontal convexities. Also seen is new subdural hemorrhage along the right side of the falx posteriorly. A small volume of subarachnoid hemorrhage on the left is identified and best seen in the Sylvian fissure and posterior aspect of the frontal lobe. There is no midline shift or hydrocephalus. No mass or evidence of acute infarction is identified. Bilateral basal ganglia calcifications and calcification in  the right cerebellar hemisphere off unchanged. Vascular: Atherosclerosis is noted.  Skull: Intact. Other: None. CT MAXILLOFACIAL FINDINGS Osseous: No fracture or mandibular dislocation. No destructive process. Orbits: The patient is status post bilateral lens extraction. The globes are intact. Hematoma about the right eye is identified. Orbital fat is clear. Sinuses: Mild mucosal thickening is seen in the left ethmoid air cells. Small mucous retention cyst or polyp right maxillary sinus is identified. Mucosal thickening inferior aspect of left maxillary sinus is seen. Soft tissues: As above.  Otherwise unremarkable. CT CERVICAL SPINE FINDINGS Alignment: Trace anterolisthesis C3 on C4 is unchanged since the prior cervical spine CT scan. Alignment is otherwise maintained. Skull base and vertebrae: No acute fracture. No primary bone lesion or focal pathologic process. Soft tissues and spinal canal: No prevertebral fluid or swelling. No visible canal hematoma. Disc levels: The patient is status post C4-5 ACDF. Multilevel loss of disc space height and endplate spurring are seen. There is multilevel facet degenerative disease appearing worst at C2-3 and C3-4. Upper chest: Lung apices are clear. Other: None. IMPRESSION: Small volume of subdural hemorrhage on the right superimposed on a subdural hygroma. Mass effect on the right cerebral convexities is unchanged compared the prior exam. Small volume of subarachnoid hemorrhage on the left. Soft tissue contusion about the right eye.  Negative for fracture. No acute abnormality cervical spine. Atrophy and chronic microvascular ischemic change. Atherosclerosis. Multilevel cervical spondylosis. Critical Value/emergent results were called by telephone at the time of interpretation on 11/07/2016 at 1:29 pm to Dr. Noemi Bradshaw , who verbally acknowledged these results. Electronically Signed   By: Inge Rise M.D.   On: 11/07/2016 13:33   Ct Cervical Spine Wo Contrast  Result Date: 11/07/2016 CLINICAL DATA:  Status post fall today. Laceration above the right  eye. Altered mental status. EXAM: CT HEAD WITHOUT CONTRAST CT MAXILLOFACIAL WITHOUT CONTRAST CT CERVICAL SPINE WITHOUT CONTRAST TECHNIQUE: Multidetector CT imaging of the head, cervical spine, and maxillofacial structures were performed using the standard protocol without intravenous contrast. Multiplanar CT image reconstructions of the cervical spine and maxillofacial structures were also generated. COMPARISON:  Head CT scan 12/31/2015. Head, face and cervical spine CT scans 11/23/2012. FINDINGS: CT HEAD FINDINGS Brain: Low attenuating collection over the right convexities consistent with a subdural hygroma measures up to approximately 1.4 cm in thickness with mass effect on the right cerebral convexities, unchanged. However, there is new subdural hemorrhage on the right adjacent to the right frontal convexities. Also seen is new subdural hemorrhage along the right side of the falx posteriorly. A small volume of subarachnoid hemorrhage on the left is identified and best seen in the Sylvian fissure and posterior aspect of the frontal lobe. There is no midline shift or hydrocephalus. No mass or evidence of acute infarction is identified. Bilateral basal ganglia calcifications and calcification in the right cerebellar hemisphere off unchanged. Vascular: Atherosclerosis is noted. Skull: Intact. Other: None. CT MAXILLOFACIAL FINDINGS Osseous: No fracture or mandibular dislocation. No destructive process. Orbits: The patient is status post bilateral lens extraction. The globes are intact. Hematoma about the right eye is identified. Orbital fat is clear. Sinuses: Mild mucosal thickening is seen in the left ethmoid air cells. Small mucous retention cyst or polyp right maxillary sinus is identified. Mucosal thickening inferior aspect of left maxillary sinus is seen. Soft tissues: As above.  Otherwise unremarkable. CT CERVICAL SPINE FINDINGS Alignment: Trace anterolisthesis C3 on C4 is unchanged since the prior cervical  spine CT scan. Alignment is otherwise maintained.  Skull base and vertebrae: No acute fracture. No primary bone lesion or focal pathologic process. Soft tissues and spinal canal: No prevertebral fluid or swelling. No visible canal hematoma. Disc levels: The patient is status post C4-5 ACDF. Multilevel loss of disc space height and endplate spurring are seen. There is multilevel facet degenerative disease appearing worst at C2-3 and C3-4. Upper chest: Lung apices are clear. Other: None. IMPRESSION: Small volume of subdural hemorrhage on the right superimposed on a subdural hygroma. Mass effect on the right cerebral convexities is unchanged compared the prior exam. Small volume of subarachnoid hemorrhage on the left. Soft tissue contusion about the right eye.  Negative for fracture. No acute abnormality cervical spine. Atrophy and chronic microvascular ischemic change. Atherosclerosis. Multilevel cervical spondylosis. Critical Value/emergent results were called by telephone at the time of interpretation on 11/07/2016 at 1:29 pm to Dr. Noemi Bradshaw , who verbally acknowledged these results. Electronically Signed   By: Inge Rise M.D.   On: 11/07/2016 13:33   Ct Maxillofacial Wo Contrast  Result Date: 11/07/2016 CLINICAL DATA:  Status post fall today. Laceration above the right eye. Altered mental status. EXAM: CT HEAD WITHOUT CONTRAST CT MAXILLOFACIAL WITHOUT CONTRAST CT CERVICAL SPINE WITHOUT CONTRAST TECHNIQUE: Multidetector CT imaging of the head, cervical spine, and maxillofacial structures were performed using the standard protocol without intravenous contrast. Multiplanar CT image reconstructions of the cervical spine and maxillofacial structures were also generated. COMPARISON:  Head CT scan 12/31/2015. Head, face and cervical spine CT scans 11/23/2012. FINDINGS: CT HEAD FINDINGS Brain: Low attenuating collection over the right convexities consistent with a subdural hygroma measures up to approximately 1.4  cm in thickness with mass effect on the right cerebral convexities, unchanged. However, there is new subdural hemorrhage on the right adjacent to the right frontal convexities. Also seen is new subdural hemorrhage along the right side of the falx posteriorly. A small volume of subarachnoid hemorrhage on the left is identified and best seen in the Sylvian fissure and posterior aspect of the frontal lobe. There is no midline shift or hydrocephalus. No mass or evidence of acute infarction is identified. Bilateral basal ganglia calcifications and calcification in the right cerebellar hemisphere off unchanged. Vascular: Atherosclerosis is noted. Skull: Intact. Other: None. CT MAXILLOFACIAL FINDINGS Osseous: No fracture or mandibular dislocation. No destructive process. Orbits: The patient is status post bilateral lens extraction. The globes are intact. Hematoma about the right eye is identified. Orbital fat is clear. Sinuses: Mild mucosal thickening is seen in the left ethmoid air cells. Small mucous retention cyst or polyp right maxillary sinus is identified. Mucosal thickening inferior aspect of left maxillary sinus is seen. Soft tissues: As above.  Otherwise unremarkable. CT CERVICAL SPINE FINDINGS Alignment: Trace anterolisthesis C3 on C4 is unchanged since the prior cervical spine CT scan. Alignment is otherwise maintained. Skull base and vertebrae: No acute fracture. No primary bone lesion or focal pathologic process. Soft tissues and spinal canal: No prevertebral fluid or swelling. No visible canal hematoma. Disc levels: The patient is status post C4-5 ACDF. Multilevel loss of disc space height and endplate spurring are seen. There is multilevel facet degenerative disease appearing worst at C2-3 and C3-4. Upper chest: Lung apices are clear. Other: None. IMPRESSION: Small volume of subdural hemorrhage on the right superimposed on a subdural hygroma. Mass effect on the right cerebral convexities is unchanged  compared the prior exam. Small volume of subarachnoid hemorrhage on the left. Soft tissue contusion about the right eye.  Negative for fracture.  No acute abnormality cervical spine. Atrophy and chronic microvascular ischemic change. Atherosclerosis. Multilevel cervical spondylosis. Critical Value/emergent results were called by telephone at the time of interpretation on 11/07/2016 at 1:29 pm to Dr. Noemi Bradshaw , who verbally acknowledged these results. Electronically Signed   By: Inge Rise M.D.   On: 11/07/2016 13:33    Procedures Procedures (including critical care time)  Medications Ordered in ED Medications  prothrombin complex conc human (KCENTRA) IVPB 2,275 Units (not administered)  phytonadione (VITAMIN K) 10 mg in dextrose 5 % 50 mL IVPB (not administered)  tetracaine (PONTOCAINE) 0.5 % ophthalmic solution 1 drop (1 drop Both Eyes Given 11/07/16 1345)  fluorescein ophthalmic strip 1 strip (1 strip Both Eyes Given 11/07/16 1345)  morphine 4 MG/ML injection 4 mg (4 mg Intramuscular Given 11/07/16 1341)  lidocaine-EPINEPHrine (XYLOCAINE W/EPI) 2 %-1:200000 (PF) injection 20 mL (20 mLs Intradermal Given by Other 11/07/16 1346)     Initial Impression / Assessment and Plan / ED Course  I have reviewed the triage vital signs and the nursing notes.  Pertinent labs & imaging results that were available during my care of the patient were reviewed by me and considered in my medical decision making (see chart for details).     Care was discussed with the trauma surgeon, Dr. Grandville Silos who graciously agreed to admit the patient in the hospital. In discussion with the admitting team it was decided to use K Sentra to reverse the Coumadin to prevent worsening bleeding.  Care was discussed with the neurosurgeon, Dr. Vertell Limber through his nurse in the operating room - agreeable to consult when pt goes to [facility] - likely repeat CT in the AM.  K Centra ordered  Pt critically ill with [facility] /  SD[facility].  LACERATION REPAIR Performed by: Johnna Acosta Authorized by: Johnna Acosta Consent: Verbal consent obtained. Risks and benefits: risks, benefits and alternatives were discussed Consent given by: patient Patient identity confirmed: provided demographic data Prepped and Draped in normal sterile fashion Wound explored  Laceration Location: R face above brown   Laceration Length: 4cm  No Foreign Bodies seen or palpated  Anesthesia: local infiltration  Local anesthetic: lidocaine 1% with epinephrine  Anesthetic total: 4 ml  Irrigation method: syringe Amount of cleaning: standard  Skin closure: 5-0 prolene  Number of sutures: 7  Technique: simple interrupted.  Patient tolerance: Patient tolerated the procedure well with no immediate complications.  Larger suture size was selected secondary to the gaping nature of the wound which was swollen and edematous for tensile strength.  Ice packs placed on face.  CRITICAL CARE Performed by: Johnna Acosta Total critical care time: 35 minutes Critical care time was exclusive of separately billable procedures and treating other patients. Critical care was necessary to treat or prevent imminent or life-threatening deterioration. Critical care was time spent personally by me on the following activities: development of treatment plan with patient and/or surrogate as well as nursing, discussions with consultants, evaluation of patient's response to treatment, examination of patient, obtaining history from patient or surrogate, ordering and performing treatments and interventions, ordering and review of laboratory studies, ordering and review of radiographic studies, pulse oximetry and re-evaluation of patient's condition.   Final Clinical Impressions(s) / ED Diagnoses   Final diagnoses:  SAH (subarachnoid hemorrhage) (HCC)  SDH (subdural hematoma) (HCC)  Facial laceration, initial encounter  Syncope and collapse  Abrasion of  right cornea, initial encounter  Subconjunctival bleed, right    New Prescriptions New Prescriptions  No medications on file   I personally performed the services described in this documentation, which was scribed in my presence. The recorded information has been reviewed and is accurate.      Fred Chapel, MD 11/07/16 1455

## 2016-11-07 NOTE — Progress Notes (Signed)
Patient arrived to 5TM93 AAOx4. Trauma MD at the bedside. [name], Rande Brunt, RN

## 2016-11-07 NOTE — H&P (Signed)
[name] is an 83 y.o. male.   Chief Complaint: Fall HPI: Murrell was walking onto his porch earlier today when he fell, striking the right side of his face. He remembers some of the event. His family states that he was speaking in a confused fashion after he fell. No LOC. He was taken to [facility] emergency department for further evaluation. He was found to have a small right subdural hematoma and a left subarachnoid hemorrhage. He is anticoagulated on Coumadin for atrial fibrillation. He was also found to have right 5th rib fracture. Dr. Vertell Limber from neurosurgery was consulted. I was then asked to accept him in transfer to [facility].  Past Medical History:  Diagnosis Date  . Atelectasis of right lung   . Chronic diastolic (congestive) heart failure    a. 10/2013 Echo: EF 55-60%, basal-mid anteroseptal and basal-mid inferoseptal HK.  . Chronic fatigue   . Chronic subdural hematoma ([facility])    a. 12/2015 Head CT: chronic right holo-hemispheric SDH w/o midline shift.  . Coronary artery disease    a. 11/2014 NSTEMI: DESx 2 placed to LAD and RCA; b. 08/2015 Cath: LAD patent stent, LCX 46m RCA patent stent, 28m . Diabetes [place] type II [place]   a. HgA1c 6.8 in 03/2015  . ED (erectile dysfunction)   . Gout   . Hemangioma of liver 08/02/2015   12 mm enhancing lesion seen on CT - suspicious for benign hemangioma but not diagnostic - MRI recommended  . HLD (hyperlipidemia)   . Hypertensive heart disease   . Kidney stones   . Lower extremity edema    a. chronic  . OA (osteoarthritis)   . Orthostatic hypotension   . PAF (paroxysmal atrial fibrillation) ([place]   a. 10/2015 s/p DCCV;  b. CHA2DS2VASc = 6--> on coumadin; c. 10/2015 Echo: sev dil LA.  . Marland Kitchenhysical deconditioning   . Prostate cancer ([place]  . Pulmonary HTN   . Pulmonary hypertension    a. 10/2015 Echo: PASP 4769m.  . SMarland Kitchenvere mitral regurgitation    a.  10/2015 s/p minimally invasive MV repair; b. 10/2015 Echo: mod MS, mean grad  6mm54m  . SpMarland Kitchennal stenosis   . SVT (supraventricular tachycardia) (HCC)    a. recurrent, usually responsive to vagal manuevers  . Tricuspid regurgitation    a. 10/2015 Echo: mild to mod TR.    Past Surgical History:  Procedure Laterality Date  . BACK SURGERY    . Back surgery x 2    . CARDIAC CATHETERIZATION N/A 08/08/2015   Procedure: Right/Left Heart Cath and Coronary Angiography;  Surgeon: MichSherren Mocha;  [facility];  Service: Cardiovascular;  Laterality: N/A;  . CARDIAC SURGERY     2 cardiac stents  . CARDIOVERSION N/A 10/20/2015   Procedure: CARDIOVERSION;  Surgeon: KennPixie Casino;  [facility];  Laterality: N/A;  . CERVICAL SPINE SURGERY    . CHOLECYSTECTOMY N/A 07/08/2016   Procedure: LAPAROSCOPIC CHOLECYSTECTOMY WITH INTRAOPERATIVE CHOLANGIOGRAM;  Surgeon: BurkGeorganna Skeans;  [facility] [place] [place];  Laterality: N/A;  . EYE SURGERY    . INTRAOPERATIVE TRANSESOPHAGEAL ECHOCARDIOGRAM  10/05/2015   Procedure: INTRAOPERATIVE TRANSESOPHAGEAL ECHOCARDIOGRAM;  Surgeon: ClarRexene Alberts;  [facility]  Service: Open Heart Surgery;;  . JOINT REPLACEMENT     shoulder  . LEFT HEART CATHETERIZATION WITH CORONARY ANGIOGRAM N/A 11/16/2014   Procedure: LEFT HEART CATHETERIZATION WITH CORONARY ANGIOGRAM;  Surgeon: ThomMarcello Moores  Floyce Stakes, MD;  Location: [facility] C[facility] LAB;  Service: Cardiovascular;  Laterality: N/A;  . MITRAL VALVE REPAIR Right 10/04/2015   Procedure: MINIMALLY INVASIVE MITRAL VALVE REPAIR (MVR);  Surgeon: Rexene Alberts, MD;  Location: [place];  Service: Open Heart Surgery;  Laterality: Right;  . Rt rotator cuff repair    . TEE WITHOUT CARDIOVERSION N/A 06/22/2015   Procedure: TRANSESOPHAGEAL ECHOCARDIOGRAM (TEE);  Surgeon: Skeet Latch, MD;  Location: [place];  Service: Cardiovascular;  Laterality: N/A;  . TEE WITHOUT CARDIOVERSION N/A 10/04/2015   Procedure: TRANSESOPHAGEAL ECHOCARDIOGRAM (TEE);  Surgeon:  Rexene Alberts, MD;  Location: [place];  Service: Open Heart Surgery;  Laterality: N/A;  . WOUND EXPLORATION N/A 10/05/2015   Procedure: Sternal Incision;  Surgeon: Rexene Alberts, MD;  Location: [place];  Service: Open Heart Surgery;  Laterality: N/A;    Family History  Problem Relation Age of Onset  . Heart failure Mother 5  . Heart attack Mother   . Heart attack Brother    Social History:  reports that he has quit smoking. His smoking use included Cigarettes, Pipe, and Cigars. He has quit using smokeless tobacco. His smokeless tobacco use included Chew. He reports that he does not drink alcohol or use drugs.  Allergies: No Known Allergies  Medications Prior to Admission  Medication Sig Dispense Refill  . allopurinol (ZYLOPRIM) 100 MG tablet Take 1 tablet (100 mg total) by mouth daily. 30 tablet 5  . aspirin EC 81 MG tablet Take 81 mg by mouth every other day. Reported on 12/29/2015    . colchicine 0.6 MG tablet Take 1 tablet (0.6 mg total) by mouth daily. 30 tablet 5  . finasteride (PROSCAR) 5 MG tablet TAKE ONE (1) TABLET EACH DAY 30 tablet 11  . furosemide (LASIX) 40 MG tablet Take 1 tablet (40 mg total) by mouth 2 (two) times daily. 60 tablet 3  . magnesium oxide (MAG-OX) 400 MG tablet Take 1 tablet (400 mg total) by mouth daily. 30 tablet 0  . metoprolol tartrate (LOPRESSOR) 25 MG tablet Take 0.5 tablets (12.5 mg total) by mouth 2 (two) times daily. 60 tablet 3  . OVER THE COUNTER MEDICATION Neuroquell: Take 1 capsule by mouth once a day for immune health    . potassium chloride (KLOR-CON) 20 MEQ packet Take 20 mEq by mouth daily. 30 packet 0  . pravastatin (PRAVACHOL) 40 MG tablet Take 1 tablet (40 mg total) by mouth daily at 6 PM. 30 tablet 11  . predniSONE (DELTASONE) 20 MG tablet 3 tabs poqday 1-2, 2 tabs poqday 3-4, 1 tab poqday 5-6 12 tablet 0  . warfarin (COUMADIN) 2 MG tablet Take as directed by [facility] 70 tablet 3  . Wheat Dextrin (BENEFIBER) POWD Take by mouth See  admin instructions. Mix one teaspoonful into 6-8 ounces of fluid/milk and drink once daily      Results for orders placed or performed during the hospital encounter of 11/07/16 (from the past 48 hour(s))  CBC with Differential/Platelet     Status: Abnormal   Collection Time: 11/07/16 12:31 PM  Result Value Ref Range   WBC 8.5 4.0 - 10.5 K/uL   RBC 4.53 4.22 - 5.81 MIL/uL   Hemoglobin 13.7 13.0 - 17.0 g/dL   HCT 41.9 39.0 - 52.0 %   MCV 92.5 78.0 - 100.0 fL   MCH 30.2 26.0 - 34.0 pg   MCHC 32.7 30.0 - 36.0 g/dL   RDW 15.5 11.5 - 15.5 %  Platelets 141 (L) 150 - 400 K/uL   Neutrophils Relative % 67 %   Neutro Abs 5.8 1.7 - 7.7 K/uL   Lymphocytes Relative 22 %   Lymphs Abs 1.9 0.7 - 4.0 K/uL   Monocytes Relative 7 %   Monocytes Absolute 0.6 0.1 - 1.0 K/uL   Eosinophils Relative 3 %   Eosinophils Absolute 0.2 0.0 - 0.7 K/uL   Basophils Relative 1 %   Basophils Absolute 0.0 0.0 - 0.1 K/uL  Basic metabolic panel     Status: Abnormal   Collection Time: 11/07/16 12:31 PM  Result Value Ref Range   Sodium 137 135 - 145 mmol/L   Potassium 3.7 3.5 - 5.1 mmol/L   Chloride 98 (L) 101 - 111 mmol/L   CO2 30 22 - 32 mmol/L   Glucose, Bld 115 (H) 65 - 99 mg/dL   BUN 22 (H) 6 - 20 mg/dL   Creatinine, Ser 1.47 (H) 0.61 - 1.24 mg/dL   Calcium 8.9 8.9 - 10.3 mg/dL   GFR calc non Af Amer 42 (L) >60 mL/min   GFR calc Af Amer 48 (L) >60 mL/min    Comment: (NOTE) The eGFR has been calculated using the CKD EPI equation. This calculation has not been validated in all clinical situations. eGFR's persistently <60 mL/min signify possible Chronic Kidney Disease.    Anion gap 9 5 - 15  Protime-INR     Status: Abnormal   Collection Time: 11/07/16 12:31 PM  Result Value Ref Range   Prothrombin Time 21.0 (H) 11.4 - 15.2 seconds   INR 1.79    Dg Ribs Unilateral W/chest Right  Result Date: 11/07/2016 CLINICAL DATA:  Right rib cage pain and bruising following a fall. EXAM: RIGHT RIBS AND CHEST - 3+  VIEW COMPARISON:  Chest x-ray of September 16, 2016 FINDINGS: There is a persistent small right pleural effusion which appears stable. There is thickening of the minor fissure. There is no pneumothorax. The left lung is clear. The cardiac silhouette is enlarged. The pulmonary vascularity is normal. There is calcification in the wall of the aortic arch. There is an acute fracture of the right fifth rib laterally. The adjacent ribs are intact. IMPRESSION: The patient has sustained an acute anterolateral right fifth rib fracture. There is a pre-existing small right pleural effusion there is no pneumothorax. Cardiomegaly without pulmonary edema. Thoracic aortic atherosclerosis. Electronically Signed   By: David  Martinique M.D.   On: 11/07/2016 13:26   Ct Head Wo Contrast  Result Date: 11/07/2016 CLINICAL DATA:  Status post fall today. Laceration above the right eye. Altered mental status. EXAM: CT HEAD WITHOUT CONTRAST CT MAXILLOFACIAL WITHOUT CONTRAST CT CERVICAL SPINE WITHOUT CONTRAST TECHNIQUE: Multidetector CT imaging of the head, cervical spine, and maxillofacial structures were performed using the standard protocol without intravenous contrast. Multiplanar CT image reconstructions of the cervical spine and maxillofacial structures were also generated. COMPARISON:  Head CT scan 12/31/2015. Head, face and cervical spine CT scans 11/23/2012. FINDINGS: CT HEAD FINDINGS Brain: Low attenuating collection over the right convexities consistent with a subdural hygroma measures up to approximately 1.4 cm in thickness with mass effect on the right cerebral convexities, unchanged. However, there is new subdural hemorrhage on the right adjacent to the right frontal convexities. Also seen is new subdural hemorrhage along the right side of the falx posteriorly. A small volume of subarachnoid hemorrhage on the left is identified and best seen in the Sylvian fissure and posterior aspect of the frontal lobe. There  is no midline  shift or hydrocephalus. No mass or evidence of acute infarction is identified. Bilateral basal ganglia calcifications and calcification in the right cerebellar hemisphere off unchanged. Vascular: Atherosclerosis is noted. Skull: Intact. Other: None. CT MAXILLOFACIAL FINDINGS Osseous: No fracture or mandibular dislocation. No destructive process. Orbits: The patient is status post bilateral lens extraction. The globes are intact. Hematoma about the right eye is identified. Orbital fat is clear. Sinuses: Mild mucosal thickening is seen in the left ethmoid air cells. Small mucous retention cyst or polyp right maxillary sinus is identified. Mucosal thickening inferior aspect of left maxillary sinus is seen. Soft tissues: As above.  Otherwise unremarkable. CT CERVICAL SPINE FINDINGS Alignment: Trace anterolisthesis C3 on C4 is unchanged since the prior cervical spine CT scan. Alignment is otherwise maintained. Skull base and vertebrae: No acute fracture. No primary bone lesion or focal pathologic process. Soft tissues and spinal canal: No prevertebral fluid or swelling. No visible canal hematoma. Disc levels: The patient is status post C4-5 ACDF. Multilevel loss of disc space height and endplate spurring are seen. There is multilevel facet degenerative disease appearing worst at C2-3 and C3-4. Upper chest: Lung apices are clear. Other: None. IMPRESSION: Small volume of subdural hemorrhage on the right superimposed on a subdural hygroma. Mass effect on the right cerebral convexities is unchanged compared the prior exam. Small volume of subarachnoid hemorrhage on the left. Soft tissue contusion about the right eye.  Negative for fracture. No acute abnormality cervical spine. Atrophy and chronic microvascular ischemic change. Atherosclerosis. Multilevel cervical spondylosis. Critical Value/emergent results were called by telephone at the time of interpretation on 11/07/2016 at 1:29 pm to Dr. [name] , who verbally  acknowledged these results. Electronically Signed   By: Inge Rise M.D.   On: 11/07/2016 13:33   Ct Cervical Spine Wo Contrast  Result Date: 11/07/2016 CLINICAL DATA:  Status post fall today. Laceration above the right eye. Altered mental status. EXAM: CT HEAD WITHOUT CONTRAST CT MAXILLOFACIAL WITHOUT CONTRAST CT CERVICAL SPINE WITHOUT CONTRAST TECHNIQUE: Multidetector CT imaging of the head, cervical spine, and maxillofacial structures were performed using the standard protocol without intravenous contrast. Multiplanar CT image reconstructions of the cervical spine and maxillofacial structures were also generated. COMPARISON:  Head CT scan 12/31/2015. Head, face and cervical spine CT scans 11/23/2012. FINDINGS: CT HEAD FINDINGS Brain: Low attenuating collection over the right convexities consistent with a subdural hygroma measures up to approximately 1.4 cm in thickness with mass effect on the right cerebral convexities, unchanged. However, there is new subdural hemorrhage on the right adjacent to the right frontal convexities. Also seen is new subdural hemorrhage along the right side of the falx posteriorly. A small volume of subarachnoid hemorrhage on the left is identified and best seen in the Sylvian fissure and posterior aspect of the frontal lobe. There is no midline shift or hydrocephalus. No mass or evidence of acute infarction is identified. Bilateral basal ganglia calcifications and calcification in the right cerebellar hemisphere off unchanged. Vascular: Atherosclerosis is noted. Skull: Intact. Other: None. CT MAXILLOFACIAL FINDINGS Osseous: No fracture or mandibular dislocation. No destructive process. Orbits: The patient is status post bilateral lens extraction. The globes are intact. Hematoma about the right eye is identified. Orbital fat is clear. Sinuses: Mild mucosal thickening is seen in the left ethmoid air cells. Small mucous retention cyst or polyp right maxillary sinus is identified.  Mucosal thickening inferior aspect of left maxillary sinus is seen. Soft tissues: As above.  Otherwise unremarkable. CT  CERVICAL SPINE FINDINGS Alignment: Trace anterolisthesis C3 on C4 is unchanged since the prior cervical spine CT scan. Alignment is otherwise maintained. Skull base and vertebrae: No acute fracture. No primary bone lesion or focal pathologic process. Soft tissues and spinal canal: No prevertebral fluid or swelling. No visible canal hematoma. Disc levels: The patient is status post C4-5 ACDF. Multilevel loss of disc space height and endplate spurring are seen. There is multilevel facet degenerative disease appearing worst at C2-3 and C3-4. Upper chest: Lung apices are clear. Other: None. IMPRESSION: Small volume of subdural hemorrhage on the right superimposed on a subdural hygroma. Mass effect on the right cerebral convexities is unchanged compared the prior exam. Small volume of subarachnoid hemorrhage on the left. Soft tissue contusion about the right eye.  Negative for fracture. No acute abnormality cervical spine. Atrophy and chronic microvascular ischemic change. Atherosclerosis. Multilevel cervical spondylosis. Critical Value/emergent results were called by telephone at the time of interpretation on 11/07/2016 at 1:29 pm to Dr. [name] , who verbally acknowledged these results. Electronically Signed   By: Inge Rise M.D.   On: 11/07/2016 13:33   Ct Maxillofacial Wo Contrast  Result Date: 11/07/2016 CLINICAL DATA:  Status post fall today. Laceration above the right eye. Altered mental status. EXAM: CT HEAD WITHOUT CONTRAST CT MAXILLOFACIAL WITHOUT CONTRAST CT CERVICAL SPINE WITHOUT CONTRAST TECHNIQUE: Multidetector CT imaging of the head, cervical spine, and maxillofacial structures were performed using the standard protocol without intravenous contrast. Multiplanar CT image reconstructions of the cervical spine and maxillofacial structures were also generated. COMPARISON:  Head CT  scan 12/31/2015. Head, face and cervical spine CT scans 11/23/2012. FINDINGS: CT HEAD FINDINGS Brain: Low attenuating collection over the right convexities consistent with a subdural hygroma measures up to approximately 1.4 cm in thickness with mass effect on the right cerebral convexities, unchanged. However, there is new subdural hemorrhage on the right adjacent to the right frontal convexities. Also seen is new subdural hemorrhage along the right side of the falx posteriorly. A small volume of subarachnoid hemorrhage on the left is identified and best seen in the Sylvian fissure and posterior aspect of the frontal lobe. There is no midline shift or hydrocephalus. No mass or evidence of acute infarction is identified. Bilateral basal ganglia calcifications and calcification in the right cerebellar hemisphere off unchanged. Vascular: Atherosclerosis is noted. Skull: Intact. Other: None. CT MAXILLOFACIAL FINDINGS Osseous: No fracture or mandibular dislocation. No destructive process. Orbits: The patient is status post bilateral lens extraction. The globes are intact. Hematoma about the right eye is identified. Orbital fat is clear. Sinuses: Mild mucosal thickening is seen in the left ethmoid air cells. Small mucous retention cyst or polyp right maxillary sinus is identified. Mucosal thickening inferior aspect of left maxillary sinus is seen. Soft tissues: As above.  Otherwise unremarkable. CT CERVICAL SPINE FINDINGS Alignment: Trace anterolisthesis C3 on C4 is unchanged since the prior cervical spine CT scan. Alignment is otherwise maintained. Skull base and vertebrae: No acute fracture. No primary bone lesion or focal pathologic process. Soft tissues and spinal canal: No prevertebral fluid or swelling. No visible canal hematoma. Disc levels: The patient is status post C4-5 ACDF. Multilevel loss of disc space height and endplate spurring are seen. There is multilevel facet degenerative disease appearing worst at  C2-3 and C3-4. Upper chest: Lung apices are clear. Other: None. IMPRESSION: Small volume of subdural hemorrhage on the right superimposed on a subdural hygroma. Mass effect on the right cerebral convexities is unchanged compared  the prior exam. Small volume of subarachnoid hemorrhage on the left. Soft tissue contusion about the right eye.  Negative for fracture. No acute abnormality cervical spine. Atrophy and chronic microvascular ischemic change. Atherosclerosis. Multilevel cervical spondylosis. Critical Value/emergent results were called by telephone at the time of interpretation on 11/07/2016 at 1:29 pm to Dr. [name] , who verbally acknowledged these results. Electronically Signed   By: Inge Rise M.D.   On: 11/07/2016 13:33    Review of Systems  Constitutional: Negative for chills and fever.  HENT: Negative for hearing loss.   Eyes: Negative for blurred vision.  Respiratory: Negative for cough and shortness of breath.   Cardiovascular: Positive for chest pain and leg swelling.  Gastrointestinal: Negative for abdominal pain, nausea and vomiting.  Genitourinary: Negative for dysuria.  Musculoskeletal: Positive for falls.  Skin: Negative for rash.  Neurological: Negative for focal weakness and loss of consciousness.  Endo/Heme/Allergies: Bruises/bleeds easily.  Psychiatric/Behavioral: Negative.     Blood pressure 134/66, pulse 69, temperature 97.7 F (36.5 C), temperature source Oral, resp. rate 15, height '5\' 10"'  (1.778 m), weight 92.9 kg (204 lb 12.9 oz), SpO2 97 %. Physical Exam  Constitutional: He appears well-developed and well-nourished. No distress.  HENT:  Head: Head is with contusion.    Right Ear: External ear and ear canal normal.  Left Ear: External ear and ear canal normal.  Nose: No sinus tenderness.  Mouth/Throat: Uvula is midline and oropharynx is clear and moist.  Laceration right eyebrow region with sutures in place, mild oozing. Significant contusion right  periorbital  Eyes: EOM are normal. Pupils are equal, round, and reactive to light.  Large right subconjunctival hemorrhage  Neck: No tracheal deviation present.  No posterior midline tenderness  Cardiovascular: Normal rate and normal heart sounds.   Distal pulses diminished, pitting edema bilateral lower extremities  Respiratory: Effort normal and breath sounds normal. No stridor. No respiratory distress. He has no wheezes. He has no rales. He exhibits tenderness.    R Chest wall contusion with tenderness  GI: Soft. He exhibits no distension. There is no tenderness. There is no rebound and no guarding.  Genitourinary: Penis normal.  Musculoskeletal: Normal range of motion. He exhibits edema. He exhibits no tenderness.  Neurological: He is alert. He displays no atrophy and no tremor. No sensory deficit. He exhibits normal muscle tone. He displays no seizure activity. GCS eye subscore is 4. GCS verbal subscore is 5. GCS motor subscore is 6.  Moves all extremities, strength equal and follows commands  Skin: Skin is warm.  Psychiatric: He has a normal mood and affect.     Assessment/Plan Fall TBI/R SDH/L SAH, chronic R hygroma - anticoagulation reversed with K-centra and vitamin K. Dr. Vertell Limber to consult. Follow-up CT head. F/U INR. R 5th rib FX - pulmonary toilet, bronchodilators when necessary, pain control  Multiple medical problems - home meds  Admit to ICU 11/07/2016, 5:07 PM

## 2016-11-07 NOTE — Consult Note (Signed)
Reason for Consult:subdural and subarachnoid hemorrhage Referring Physician: Thompsosn  JASTEN GUYETTE is an 83 y.o. male.    HPI:  Patient was walking onto his porch earlier today when he fell, striking the right side of his face. He remembers some of the event. His family states that he was speaking in a confused fashion after he fell. No LOC. He was taken to [facility] emergency department for further evaluation. He was found to have a small right subdural hematoma and a left subarachnoid hemorrhage. He is anticoagulated on Coumadin for atrial fibrillation. He was also found to have right 5th rib fracture. He was transferred to the Trauma service and admitted to the ICU for observation.  Head CT is most suggestive of chronic subdural hygroma, rather than new hemorrhage and is, in fact, stable from 12/2015 CT scan..  There is some subarachnoid blood on the left, which is likely acute and the result of this injury.  Past Medical History:  Diagnosis Date  . Atelectasis of right lung   . Chronic diastolic (congestive) heart failure    a. 10/2013 Echo: EF 55-60%, basal-mid anteroseptal and basal-mid inferoseptal HK.  . Chronic fatigue   . Chronic subdural hematoma ([place])    a. 12/2015 Head CT: chronic right holo-hemispheric SDH w/o midline shift.  . Coronary artery disease    a. 11/2014 NSTEMI: DESx 2 placed to LAD and RCA; b. 08/2015 Cath: LAD patent stent, LCX 59m RCA patent stent, 214m . Diabetes mellitus, type II ([place]   a. HgA1c 6.8 in 03/2015  . ED (erectile dysfunction)   . Gout   . Hemangioma of liver 08/02/2015   12 mm enhancing lesion seen on CT - suspicious for benign hemangioma but not diagnostic - MRI recommended  . HLD (hyperlipidemia)   . Hypertensive heart disease   . Kidney stones   . Lower extremity edema    a. chronic  . OA (osteoarthritis)   . Orthostatic hypotension   . PAF (paroxysmal atrial fibrillation) ([facility] [place]   a. 10/2015 s/p DCCV;  b. CHA2DS2VASc = 6--> on  coumadin; c. 10/2015 Echo: sev dil LA.  . Marland Kitchenhysical deconditioning   . Prostate cancer ([facility]  . Pulmonary HTN   . Pulmonary hypertension    a. 10/2015 Echo: PASP 4712m.  . SMarland Kitchenvere mitral regurgitation    a.  10/2015 s/p minimally invasive MV repair; b. 10/2015 Echo: mod MS, mean grad 6mm48m  . SpMarland Kitchennal stenosis   . SVT (supraventricular tachycardia) (HCC)    a. recurrent, usually responsive to vagal manuevers  . Tricuspid regurgitation    a. 10/2015 Echo: mild to mod TR.    Past Surgical History:  Procedure Laterality Date  . BACK SURGERY    . Back surgery x 2    . CARDIAC CATHETERIZATION N/A 08/08/2015   Procedure: Right/Left Heart Cath and Coronary Angiography;  Surgeon: MichSherren Mocha;  [facility];  Service: Cardiovascular;  Laterality: N/A;  . CARDIAC SURGERY     2 cardiac stents  . CARDIOVERSION N/A 10/20/2015   Procedure: CARDIOVERSION;  Surgeon: KennPixie Casino;  [facility] [place];  Laterality: N/A;  . CERVICAL SPINE SURGERY    . CHOLECYSTECTOMY N/A 07/08/2016   Procedure: LAPAROSCOPIC CHOLECYSTECTOMY WITH INTRAOPERATIVE CHOLANGIOGRAM;  Surgeon: BurkGeorganna Skeans;  [facility];  Laterality: N/A;  . EYE SURGERY    . INTRAOPERATIVE TRANSESOPHAGEAL ECHOCARDIOGRAM  10/05/2015   Procedure: INTRAOPERATIVE TRANSESOPHAGEAL  ECHOCARDIOGRAM;  Surgeon: Rexene Alberts, MD;  Location: [place];  Service: Open Heart Surgery;;  . JOINT REPLACEMENT     shoulder  . LEFT HEART CATHETERIZATION WITH CORONARY ANGIOGRAM N/A 11/16/2014   Procedure: LEFT HEART CATHETERIZATION WITH CORONARY ANGIOGRAM;  Surgeon: Troy Sine, MD;  Location: [facility] CATH LAB;  Service: Cardiovascular;  Laterality: N/A;  . MITRAL VALVE REPAIR Right 10/04/2015   Procedure: MINIMALLY INVASIVE MITRAL VALVE REPAIR (MVR);  Surgeon: Rexene Alberts, MD;  Location: [place];  Service: Open Heart Surgery;  Laterality: Right;  . Rt rotator cuff repair    . TEE WITHOUT  CARDIOVERSION N/A 06/22/2015   Procedure: TRANSESOPHAGEAL ECHOCARDIOGRAM (TEE);  Surgeon: Skeet Latch, MD;  Location: [place];  Service: Cardiovascular;  Laterality: N/A;  . TEE WITHOUT CARDIOVERSION N/A 10/04/2015   Procedure: TRANSESOPHAGEAL ECHOCARDIOGRAM (TEE);  Surgeon: Rexene Alberts, MD;  Location: [place];  Service: Open Heart Surgery;  Laterality: N/A;  . WOUND EXPLORATION N/A 10/05/2015   Procedure: Sternal Incision;  Surgeon: Rexene Alberts, MD;  Location: [place];  Service: Open Heart Surgery;  Laterality: N/A;    Family History  Problem Relation Age of Onset  . Heart failure Mother 42  . Heart attack Mother   . Heart attack Brother     Social History:  reports that he has quit smoking. His smoking use included Cigarettes, Pipe, and Cigars. He has quit using smokeless tobacco. His smokeless tobacco use included Chew. He reports that he does not drink alcohol or use drugs.  Allergies: No Known Allergies  Medications: I have reviewed the patient's current medications.  Results for orders placed or performed during the hospital encounter of 11/07/16 (from the past 48 hour(s))  CBC with Differential/Platelet     Status: Abnormal   Collection Time: 11/07/16 12:31 PM  Result Value Ref Range   WBC 8.5 4.0 - 10.5 K/uL   RBC 4.53 4.22 - 5.81 MIL/uL   Hemoglobin 13.7 13.0 - 17.0 g/dL   HCT 41.9 39.0 - 52.0 %   MCV 92.5 78.0 - 100.0 fL   MCH 30.2 26.0 - 34.0 pg   MCHC 32.7 30.0 - 36.0 g/dL   RDW 15.5 11.5 - 15.5 %   Platelets 141 (L) 150 - 400 K/uL   Neutrophils Relative % 67 %   Neutro Abs 5.8 1.7 - 7.7 K/uL   Lymphocytes Relative 22 %   Lymphs Abs 1.9 0.7 - 4.0 K/uL   Monocytes Relative 7 %   Monocytes Absolute 0.6 0.1 - 1.0 K/uL   Eosinophils Relative 3 %   Eosinophils Absolute 0.2 0.0 - 0.7 K/uL   Basophils Relative 1 %   Basophils Absolute 0.0 0.0 - 0.1 K/uL  Basic metabolic panel     Status: Abnormal   Collection Time: 11/07/16 12:31 PM  Result Value Ref Range    Sodium 137 135 - 145 mmol/L   Potassium 3.7 3.5 - 5.1 mmol/L   Chloride 98 (L) 101 - 111 mmol/L   CO2 30 22 - 32 mmol/L   Glucose, Bld 115 (H) 65 - 99 mg/dL   BUN 22 (H) 6 - 20 mg/dL   Creatinine, Ser 1.47 (H) 0.61 - 1.24 mg/dL   Calcium 8.9 8.9 - 10.3 mg/dL   GFR calc non Af Amer 42 (L) >60 mL/min   GFR calc Af Amer 48 (L) >60 mL/min    Comment: (NOTE) The eGFR has been calculated using the CKD EPI equation. This calculation has not  been validated in all clinical situations. eGFR's persistently <60 mL/min signify possible Chronic Kidney Disease.    Anion gap 9 5 - 15  Protime-INR     Status: Abnormal   Collection Time: 11/07/16 12:31 PM  Result Value Ref Range   Prothrombin Time 21.0 (H) 11.4 - 15.2 seconds   INR 1.79     Dg Ribs Unilateral W/chest Right  Result Date: 11/07/2016 CLINICAL DATA:  Right rib cage pain and bruising following a fall. EXAM: RIGHT RIBS AND CHEST - 3+ VIEW COMPARISON:  Chest x-ray of September 16, 2016 FINDINGS: There is a persistent small right pleural effusion which appears stable. There is thickening of the minor fissure. There is no pneumothorax. The left lung is clear. The cardiac silhouette is enlarged. The pulmonary vascularity is normal. There is calcification in the wall of the aortic arch. There is an acute fracture of the right fifth rib laterally. The adjacent ribs are intact. IMPRESSION: The patient has sustained an acute anterolateral right fifth rib fracture. There is a pre-existing small right pleural effusion there is no pneumothorax. Cardiomegaly without pulmonary edema. Thoracic aortic atherosclerosis. Electronically Signed   By: David  Martinique M.D.   On: 11/07/2016 13:26   Ct Head Wo Contrast  Result Date: 11/07/2016 CLINICAL DATA:  Status post fall today. Laceration above the right eye. Altered mental status. EXAM: CT HEAD WITHOUT CONTRAST CT MAXILLOFACIAL WITHOUT CONTRAST CT CERVICAL SPINE WITHOUT CONTRAST TECHNIQUE: Multidetector CT  imaging of the head, cervical spine, and maxillofacial structures were performed using the standard protocol without intravenous contrast. Multiplanar CT image reconstructions of the cervical spine and maxillofacial structures were also generated. COMPARISON:  Head CT scan 12/31/2015. Head, face and cervical spine CT scans 11/23/2012. FINDINGS: CT HEAD FINDINGS Brain: Low attenuating collection over the right convexities consistent with a subdural hygroma measures up to approximately 1.4 cm in thickness with mass effect on the right cerebral convexities, unchanged. However, there is new subdural hemorrhage on the right adjacent to the right frontal convexities. Also seen is new subdural hemorrhage along the right side of the falx posteriorly. A small volume of subarachnoid hemorrhage on the left is identified and best seen in the Sylvian fissure and posterior aspect of the frontal lobe. There is no midline shift or hydrocephalus. No mass or evidence of acute infarction is identified. Bilateral basal ganglia calcifications and calcification in the right cerebellar hemisphere off unchanged. Vascular: Atherosclerosis is noted. Skull: Intact. Other: None. CT MAXILLOFACIAL FINDINGS Osseous: No fracture or mandibular dislocation. No destructive process. Orbits: The patient is status post bilateral lens extraction. The globes are intact. Hematoma about the right eye is identified. Orbital fat is clear. Sinuses: Mild mucosal thickening is seen in the left ethmoid air cells. Small mucous retention cyst or polyp right maxillary sinus is identified. Mucosal thickening inferior aspect of left maxillary sinus is seen. Soft tissues: As above.  Otherwise unremarkable. CT CERVICAL SPINE FINDINGS Alignment: Trace anterolisthesis C3 on C4 is unchanged since the prior cervical spine CT scan. Alignment is otherwise maintained. Skull base and vertebrae: No acute fracture. No primary bone lesion or focal pathologic process. Soft tissues  and spinal canal: No prevertebral fluid or swelling. No visible canal hematoma. Disc levels: The patient is status post C4-5 ACDF. Multilevel loss of disc space height and endplate spurring are seen. There is multilevel facet degenerative disease appearing worst at C2-3 and C3-4. Upper chest: Lung apices are clear. Other: None. IMPRESSION: Small volume of subdural hemorrhage on the right  superimposed on a subdural hygroma. Mass effect on the right cerebral convexities is unchanged compared the prior exam. Small volume of subarachnoid hemorrhage on the left. Soft tissue contusion about the right eye.  Negative for fracture. No acute abnormality cervical spine. Atrophy and chronic microvascular ischemic change. Atherosclerosis. Multilevel cervical spondylosis. Critical Value/emergent results were called by telephone at the time of interpretation on 11/07/2016 at 1:29 pm to Dr. [name] , who verbally acknowledged these results. Electronically Signed   By: Inge Rise M.D.   On: 11/07/2016 13:33   Ct Cervical Spine Wo Contrast  Result Date: 11/07/2016 CLINICAL DATA:  Status post fall today. Laceration above the right eye. Altered mental status. EXAM: CT HEAD WITHOUT CONTRAST CT MAXILLOFACIAL WITHOUT CONTRAST CT CERVICAL SPINE WITHOUT CONTRAST TECHNIQUE: Multidetector CT imaging of the head, cervical spine, and maxillofacial structures were performed using the standard protocol without intravenous contrast. Multiplanar CT image reconstructions of the cervical spine and maxillofacial structures were also generated. COMPARISON:  Head CT scan 12/31/2015. Head, face and cervical spine CT scans 11/23/2012. FINDINGS: CT HEAD FINDINGS Brain: Low attenuating collection over the right convexities consistent with a subdural hygroma measures up to approximately 1.4 cm in thickness with mass effect on the right cerebral convexities, unchanged. However, there is new subdural hemorrhage on the right adjacent to the right  frontal convexities. Also seen is new subdural hemorrhage along the right side of the falx posteriorly. A small volume of subarachnoid hemorrhage on the left is identified and best seen in the Sylvian fissure and posterior aspect of the frontal lobe. There is no midline shift or hydrocephalus. No mass or evidence of acute infarction is identified. Bilateral basal ganglia calcifications and calcification in the right cerebellar hemisphere off unchanged. Vascular: Atherosclerosis is noted. Skull: Intact. Other: None. CT MAXILLOFACIAL FINDINGS Osseous: No fracture or mandibular dislocation. No destructive process. Orbits: The patient is status post bilateral lens extraction. The globes are intact. Hematoma about the right eye is identified. Orbital fat is clear. Sinuses: Mild mucosal thickening is seen in the left ethmoid air cells. Small mucous retention cyst or polyp right maxillary sinus is identified. Mucosal thickening inferior aspect of left maxillary sinus is seen. Soft tissues: As above.  Otherwise unremarkable. CT CERVICAL SPINE FINDINGS Alignment: Trace anterolisthesis C3 on C4 is unchanged since the prior cervical spine CT scan. Alignment is otherwise maintained. Skull base and vertebrae: No acute fracture. No primary bone lesion or focal pathologic process. Soft tissues and spinal canal: No prevertebral fluid or swelling. No visible canal hematoma. Disc levels: The patient is status post C4-5 ACDF. Multilevel loss of disc space height and endplate spurring are seen. There is multilevel facet degenerative disease appearing worst at C2-3 and C3-4. Upper chest: Lung apices are clear. Other: None. IMPRESSION: Small volume of subdural hemorrhage on the right superimposed on a subdural hygroma. Mass effect on the right cerebral convexities is unchanged compared the prior exam. Small volume of subarachnoid hemorrhage on the left. Soft tissue contusion about the right eye.  Negative for fracture. No acute  abnormality cervical spine. Atrophy and chronic microvascular ischemic change. Atherosclerosis. Multilevel cervical spondylosis. Critical Value/emergent results were called by telephone at the time of interpretation on 11/07/2016 at 1:29 pm to Dr. [name] , who verbally acknowledged these results. Electronically Signed   By: Inge Rise M.D.   On: 11/07/2016 13:33   Ct Maxillofacial Wo Contrast  Result Date: 11/07/2016 CLINICAL DATA:  Status post fall today. Laceration above the right  eye. Altered mental status. EXAM: CT HEAD WITHOUT CONTRAST CT MAXILLOFACIAL WITHOUT CONTRAST CT CERVICAL SPINE WITHOUT CONTRAST TECHNIQUE: Multidetector CT imaging of the head, cervical spine, and maxillofacial structures were performed using the standard protocol without intravenous contrast. Multiplanar CT image reconstructions of the cervical spine and maxillofacial structures were also generated. COMPARISON:  Head CT scan 12/31/2015. Head, face and cervical spine CT scans 11/23/2012. FINDINGS: CT HEAD FINDINGS Brain: Low attenuating collection over the right convexities consistent with a subdural hygroma measures up to approximately 1.4 cm in thickness with mass effect on the right cerebral convexities, unchanged. However, there is new subdural hemorrhage on the right adjacent to the right frontal convexities. Also seen is new subdural hemorrhage along the right side of the falx posteriorly. A small volume of subarachnoid hemorrhage on the left is identified and best seen in the Sylvian fissure and posterior aspect of the frontal lobe. There is no midline shift or hydrocephalus. No mass or evidence of acute infarction is identified. Bilateral basal ganglia calcifications and calcification in the right cerebellar hemisphere off unchanged. Vascular: Atherosclerosis is noted. Skull: Intact. Other: None. CT MAXILLOFACIAL FINDINGS Osseous: No fracture or mandibular dislocation. No destructive process. Orbits: The patient is  status post bilateral lens extraction. The globes are intact. Hematoma about the right eye is identified. Orbital fat is clear. Sinuses: Mild mucosal thickening is seen in the left ethmoid air cells. Small mucous retention cyst or polyp right maxillary sinus is identified. Mucosal thickening inferior aspect of left maxillary sinus is seen. Soft tissues: As above.  Otherwise unremarkable. CT CERVICAL SPINE FINDINGS Alignment: Trace anterolisthesis C3 on C4 is unchanged since the prior cervical spine CT scan. Alignment is otherwise maintained. Skull base and vertebrae: No acute fracture. No primary bone lesion or focal pathologic process. Soft tissues and spinal canal: No prevertebral fluid or swelling. No visible canal hematoma. Disc levels: The patient is status post C4-5 ACDF. Multilevel loss of disc space height and endplate spurring are seen. There is multilevel facet degenerative disease appearing worst at C2-3 and C3-4. Upper chest: Lung apices are clear. Other: None. IMPRESSION: Small volume of subdural hemorrhage on the right superimposed on a subdural hygroma. Mass effect on the right cerebral convexities is unchanged compared the prior exam. Small volume of subarachnoid hemorrhage on the left. Soft tissue contusion about the right eye.  Negative for fracture. No acute abnormality cervical spine. Atrophy and chronic microvascular ischemic change. Atherosclerosis. Multilevel cervical spondylosis. Critical Value/emergent results were called by telephone at the time of interpretation on 11/07/2016 at 1:29 pm to Dr. [name] , who verbally acknowledged these results. Electronically Signed   By: Inge Rise M.D.   On: 11/07/2016 13:33    Review of Systems - Negative except chest pain, leg swelling, falls, easy bruising    Blood pressure 127/72, pulse 66, temperature 97.7 F (36.5 C), temperature source Oral, resp. rate 11, height '5\' 10"'  (1.778 m), weight 92.9 kg (204 lb 12.9 oz), SpO2 95  %. Physical Exam  Constitutional: He is oriented to person, place, and time. He appears well-developed and well-nourished.  HENT:  Head: Normocephalic. Head is with raccoon's eyes, with contusion and with right periorbital erythema.    Eyes: EOM are normal. Pupils are equal, round, and reactive to light. Right conjunctiva has a hemorrhage. Left conjunctiva has no hemorrhage.  Neck: Normal range of motion. Neck supple.  Neurological: He is alert and oriented to person, place, and time. He has normal strength and normal reflexes.  No cranial nerve deficit or sensory deficit. GCS eye subscore is 4. GCS verbal subscore is 5. GCS motor subscore is 6.  No drift    Assessment/Plan: Patient has chronic SDH right hemisphere, stable from 12/2015, with new left subarachnoid hemorrhage.  Patient is on coumadin.  Given frequent falls, it may make sense for coumadin to be stopped.  Patient also has right fifth rib fracture and bruising over right eye and conjunctive.  He will be observed in ICU.  Repeat Head CT in AM.  Mariaceleste Herrera D, MD 11/07/2016, 7:03 PM

## 2016-11-07 NOTE — ED Triage Notes (Signed)
Pt. [place] while leaving the house today. He does not remember falling. Doesn't know if he lost consciousness or not. Rt sided head laceration at eyebrow. Currently bleeding.  Pt. Complaining of right shoulder pain as well. Rates pain as 8/10.  Pt. A&O x4 while here, but wife says before he got here he was "talking out of his head."

## 2016-11-08 ENCOUNTER — Inpatient Hospital Stay (HOSPITAL_COMMUNITY): Payer: Medicare Other

## 2016-11-08 LAB — BASIC METABOLIC PANEL
ANION GAP: 8 (ref 5–15)
BUN: 15 mg/dL (ref 6–20)
CHLORIDE: 100 mmol/L — AB (ref 101–111)
CO2: 28 mmol/L (ref 22–32)
Calcium: 8.5 mg/dL — ABNORMAL LOW (ref 8.9–10.3)
Creatinine, Ser: 1.1 mg/dL (ref 0.61–1.24)
GFR calc non Af Amer: 59 mL/min — ABNORMAL LOW (ref >60)
Glucose, Bld: 128 mg/dL — ABNORMAL HIGH (ref 65–99)
Potassium: 3.7 mmol/L (ref 3.5–5.1)
Sodium: 136 mmol/L (ref 135–145)

## 2016-11-08 LAB — CBC
HEMATOCRIT: 36.8 % — AB (ref 39.0–52.0)
Hemoglobin: 11.9 g/dL — ABNORMAL LOW (ref 13.0–17.0)
MCH: 29.7 pg (ref 26.0–34.0)
MCHC: 32.3 g/dL (ref 30.0–36.0)
MCV: 91.8 fL (ref 78.0–100.0)
PLATELETS: 123 10*3/uL — AB (ref 150–400)
RBC: 4.01 MIL/uL — AB (ref 4.22–5.81)
RDW: 15.1 % (ref 11.5–15.5)
WBC: 8.9 10*3/uL (ref 4.0–10.5)

## 2016-11-08 LAB — PROTIME-INR
INR: 1.24
INR: 1.22
INR: 1.29
PROTHROMBIN TIME: 15.5 s — AB (ref 11.4–15.2)
PROTHROMBIN TIME: 16.2 s — AB (ref 11.4–15.2)
Prothrombin Time: 15.7 s — ABNORMAL HIGH (ref 11.4–15.2)

## 2016-11-08 NOTE — Care Management Note (Signed)
Case Management Note  Patient Details  Name: [name] MRN: [id number] Date of Birth: [birth year]  Subjective/Objective:   Pt admitted on 11/07/16 s/p fall while on coumadin, sustaining Rt SDH and SAH.  PTA, pt resided at home with spouse.                  Action/Plan: PT/OT recommending HH follow up at discharge.  Case manager will follow for home needs as pt progresses.  MD: please leave order for [facility] if you agree with assessment.     Expected Discharge Date:                  Expected Discharge Plan:  [place]  In-House Referral:     Discharge planning Services  CM Consult  Post Acute Care Choice:    Choice offered to:     DME Arranged:    DME Agency:     HH Arranged:    [place] Agency:     Status of Service:  In process, will continue to follow  If discussed at Long Length of Stay Meetings, dates discussed:    Additional Comments:  Reinaldo Raddle, RN, BSN  Trauma/Neuro ICU Case Manager [phone]

## 2016-11-08 NOTE — Evaluation (Signed)
Occupational Therapy Evaluation Patient Details Name: [name] MRN: [id number] DOB: April 20, 1929 Today's Date: 11/08/2016    History of Present Illness Patient is a 83 y/o male who presents s/p fall at home. PMH includes CAD, MVR, A-fib, DM, SDH, HF. Head CT-Small volume of subdural hemorrhage on the right superimposed on a chronic subdural hygroma. Head CT 4/6-New small focus of hemorrhage within the right quadrigeminal plate cistern. CXR-right fifth rib fxx.   Clinical Impression   Pt reports he was independent with ADL PTA. Currently pt requires min assist for stand pivot transfers and mod-max assist overall for ADL. Pt indicating pain in R hand and shoulder; increased edema noted in R hand especially at MCPs and with limited A/PROM (RN aware). Pt presenting with impaired cognition, pain, poor standing balance, and limited use of RUE impacting his independence and safety with ADL and functional mobility. Pt planning to d/c home with 24/7 supervision from his wife. Recommending HHOT for follow up to maximize independence and safety with ADL and functional mobility upon return home. Pt would benefit from continued skilled OT to address established goals.    Follow Up Recommendations  Home health OT;Supervision/Assistance - 24 hour    Equipment Recommendations  None recommended by OT    Recommendations for Other Services       Precautions / Restrictions Precautions Precautions: Fall Restrictions Weight Bearing Restrictions: No      Mobility Bed Mobility Overal bed mobility: Needs Assistance Bed Mobility: Rolling;Sidelying to Sit Rolling: Mod assist Sidelying to sit: Min assist;HOB elevated       General bed mobility comments: Step by step cues for sequencing for log roll. Use of PT's hand as the rail and then pt able to reach rail post rolling; assist to elevate trunk.  Transfers Overall transfer level: Needs assistance Equipment used: None Transfers: Sit to/from  [facility] Sit to Stand: Min assist Stand pivot transfers: Min assist       General transfer comment: Assist to steady in standing. SPT to chair with Min HHA.    Balance Overall balance assessment: Needs assistance Sitting-balance support: Feet supported;Single extremity supported Sitting balance-Leahy Scale: Fair Sitting balance - Comments: Guarded sitting EOB.    Standing balance support: During functional activity;Single extremity supported Standing balance-Leahy Scale: Fair Standing balance comment: Able to perform static standing with 1 UE support for stability.                            ADL either performed or assessed with clinical judgement   ADL Overall ADL's : Needs assistance/impaired Eating/Feeding: Set up;Sitting Eating/Feeding Details (indicate cue type and reason): Reports he has been self feeding with L hand Grooming: Moderate assistance;Sitting   Upper Body Bathing: Moderate assistance;Sitting   Lower Body Bathing: Maximal assistance;Sit to/from stand   Upper Body Dressing : Minimal assistance;Sitting   Lower Body Dressing: Maximal assistance;Sit to/from stand   Toilet Transfer: Minimal Production assistant, radio Details (indicate cue type and reason): Simulated by EOB > chair         Functional mobility during ADLs: Minimal assistance (HHA, stand pivot only)       Vision Baseline Vision/History: Wears glasses Wears Glasses: Reading only Patient Visual Report: No change from baseline Additional Comments: Pt able to read OT name badge and reports no double vision or blurred vision.     Perception     Praxis      Pertinent Vitals/Pain Pain Assessment: 0-10  Pain Score: 4  Pain Location: R shoulder, R hand Pain Descriptors / Indicators: Stabbing Pain Intervention(s): Monitored during session;Repositioned;Patient requesting pain meds-RN notified;Limited activity within patient's tolerance     Hand  Dominance Right   Extremity/Trunk Assessment Upper Extremity Assessment Upper Extremity Assessment: RUE deficits/detail RUE Deficits / Details: Increased edema in R hand, especially at MCPs. Limited ROM. Pt also c/o R shoulder pain (scapular area) with ~90 degrees FF. RUE: Unable to fully assess due to pain RUE Coordination: decreased fine motor   Lower Extremity Assessment Lower Extremity Assessment: Defer to PT evaluation   Cervical / Trunk Assessment Cervical / Trunk Assessment: Other exceptions Cervical / Trunk Exceptions: hx of back sx   Communication Communication Communication: Expressive difficulties (soft spoke and dysarthric)   Cognition Arousal/Alertness: Awake/alert Behavior During Therapy: WFL for tasks assessed/performed Overall Cognitive Status: Impaired/Different from baseline Area of Impairment: Memory                     Memory: Decreased short-term memory         General Comments: A&Ox4. Pt known to this PT from prior admissions. Cognition seems close if not at baseline. Difficult to understand at times due to dysarthria. Jokes appropriately.   General Comments  Supine BP 119/61; Sitting BP 125/89    Exercises     Shoulder Instructions      Home Living Family/patient expects to be discharged to:: Private residence Living Arrangements: Spouse/significant other Available Help at Discharge: Family;Available 24 hours/day Type of Home: House Home Access: Ramped entrance     Home Layout: Two level Alternate Level Stairs-Number of Steps: bedroom on first level   Bathroom Shower/Tub: Occupational psychologist: Standard Bathroom Accessibility: Yes   Home Equipment: Environmental consultant - 2 wheels;Cane - single point;Bedside commode;Shower seat          Prior Functioning/Environment Level of Independence: Independent with assistive device(s)        Comments: Uses SPC intermittently when going outside.        OT Problem List: Decreased  strength;Decreased range of motion;Impaired balance (sitting and/or standing);Decreased coordination;Decreased cognition;Decreased safety awareness;Decreased knowledge of use of DME or AE;Impaired UE functional use;Pain;Increased edema      OT Treatment/Interventions: Self-care/ADL training;DME and/or AE instruction;Patient/family education;Balance training;Therapeutic activities    OT Goals(Current goals can be found in the care plan section) Acute Rehab OT Goals Patient Stated Goal: to make this pain better OT Goal Formulation: With patient Time For Goal Achievement: 11/22/16 Potential to Achieve Goals: Good ADL Goals Pt Will Perform Grooming: with supervision;standing Pt Will Perform Upper Body Bathing: with supervision;sitting Pt Will Perform Lower Body Bathing: with supervision;sit to/from stand Pt Will Transfer to Toilet: with supervision;ambulating;bedside commode (over toilet) Pt Will Perform Toileting - Clothing Manipulation and hygiene: with supervision;sit to/from stand  OT Frequency: Min 2X/week   Barriers to D/C:            Co-evaluation PT/OT/SLP Co-Evaluation/Treatment: Yes Reason for Co-Treatment: To address functional/ADL transfers PT goals addressed during session: Mobility/safety with mobility OT goals addressed during session: ADL's and self-care      End of Session Equipment Utilized During Treatment: Gait belt Nurse Communication: Mobility status;Patient requests pain meds;Other (comment) (R hand/shoulder swelling and pain)  Activity Tolerance: Patient limited by pain;Patient tolerated treatment well Patient left: in chair;with call bell/phone within reach;with chair alarm set  OT Visit Diagnosis: Unsteadiness on feet (R26.81);Repeated falls (R29.6);Pain Pain - Right/Left: Right Pain - part of body: Shoulder;Hand  Time: 1402-1430 OT Time Calculation (min): 28 min Charges:  OT General Charges $OT Visit: 1 Procedure OT Evaluation $OT  Eval Moderate Complexity: 1 Procedure G-Codes:     Zaynah Chawla A. Ulice Brilliant, M.S., OTR/L Pager: [place] 11/08/2016, 3:10 PM

## 2016-11-08 NOTE — Evaluation (Signed)
Physical Therapy Evaluation Patient Details Name: [name] MRN: [id number] DOB: 1929-03-14 Today's Date: 11/08/2016   History of Present Illness  Patient is a 83 y/o male who presents s/p fall at home. PMH includes CAD, MVR, A-fib, DM, SDH, HF. Head CT-Small volume of subdural hemorrhage on the right superimposed on a chronic subdural hygroma. Head CT 4/6-New small focus of hemorrhage within the right quadrigeminal plate cistern. CXR-right fifth rib fxx.  Clinical Impression  Patient presents with pain in shoulder/ribs/back/hand and impaired mobility s/p above. Tolerated bed mobility and SPT to chair with Min A for balance. Declined ambulation today due to pain. Pt mainly limited by pain and guarded with all movement requiring increased time and rest breaks. Pt Mod I PTA using SPC PRN. Has support of wife at home. Will follow acutely to maximize independence and mobility prior to return home.     Follow Up Recommendations Home health PT;Supervision for mobility/OOB    Equipment Recommendations  None recommended by PT    Recommendations for Other Services       Precautions / Restrictions Precautions Precautions: Fall Restrictions Weight Bearing Restrictions: No      Mobility  Bed Mobility Overal bed mobility: Needs Assistance Bed Mobility: Rolling;Sidelying to Sit Rolling: Mod assist Sidelying to sit: Min assist;HOB elevated       General bed mobility comments: Step by step cues for sequencing for log roll. Use of PT's hand as the rail and then pt able to reach rail post rolling; assist to elevate trunk.  Transfers Overall transfer level: Needs assistance Equipment used: None Transfers: Sit to/from [facility] Sit to Stand: Min assist Stand pivot transfers: Min guard       General transfer comment: Assist to steady in standing. SPT to chair with Min guard assist for safety.   Ambulation/Gait Ambulation/Gait assistance:  (Deferred due to pain.)               Stairs            Wheelchair Mobility    Modified Rankin (Stroke Patients Only) Modified Rankin (Stroke Patients Only) Pre-Morbid Rankin Score: Slight disability Modified Rankin: Moderately severe disability     Balance Overall balance assessment: Needs assistance Sitting-balance support: Feet supported;Single extremity supported Sitting balance-Leahy Scale: Fair Sitting balance - Comments: Guarded sitting EOB.    Standing balance support: During functional activity;Single extremity supported Standing balance-Leahy Scale: Fair Standing balance comment: Able to perform static standing with 1 UE support for stability.                              Pertinent Vitals/Pain Pain Assessment: 0-10 Pain Score: 4  Pain Location: right shoulder/hand Pain Descriptors / Indicators: Stabbing Pain Intervention(s): Monitored during session;Limited activity within patient's tolerance;Repositioned    Home Living Family/patient expects to be discharged to:: Private residence Living Arrangements: Spouse/significant other Available Help at Discharge: Family;Available 24 hours/day Type of Home: House Home Access: Ramped entrance     Home Layout: Two level Home Equipment: Walker - 2 wheels;Cane - single point;Bedside commode;Shower seat      Prior Function Level of Independence: Independent with assistive device(s)         Comments: Uses SPC intermittently when going outside.     Hand Dominance   Dominant Hand: Right    Extremity/Trunk Assessment   Upper Extremity Assessment Upper Extremity Assessment: Defer to OT evaluation (Right hand swelling esp at MCPs, limited AROM)  Lower Extremity Assessment Lower Extremity Assessment: Generalized weakness       Communication   Communication: Expressive difficulties (soft spoken and dysarthric)  Cognition Arousal/Alertness: Awake/alert Behavior During Therapy: WFL for tasks  assessed/performed Overall Cognitive Status: Impaired/Different from baseline Area of Impairment: Memory                     Memory: Decreased short-term memory         General Comments: A&Ox4. Pt known to this PT from prior admissions. Cognition seems close if not at baseline. Difficult to understand at times due to dysarthria. Jokes appropriately.      General Comments General comments (skin integrity, edema, etc.): Supine BP 119/61; Sitting BP 125/89    Exercises     Assessment/Plan    PT Assessment Patient needs continued PT services  PT Problem List Decreased strength;Decreased mobility;Decreased balance;Decreased activity tolerance;Pain;Decreased cognition       PT Treatment Interventions Therapeutic activities;Gait training;Therapeutic exercise;Patient/family education;Balance training;Functional mobility training;DME instruction;Neuromuscular re-education    PT Goals (Current goals can be found in the Care Plan section)  Acute Rehab PT Goals Patient Stated Goal: to make this pain better PT Goal Formulation: With patient Time For Goal Achievement: 11/22/16 Potential to Achieve Goals: Good    Frequency Min 4X/week   Barriers to discharge        Co-evaluation PT/OT/SLP Co-Evaluation/Treatment: Yes Reason for Co-Treatment: To address functional/ADL transfers PT goals addressed during session: Mobility/safety with mobility         End of Session Equipment Utilized During Treatment: Gait belt Activity Tolerance: Patient limited by pain Patient left: in chair;with call bell/phone within reach;with chair alarm set Nurse Communication: Mobility status PT Visit Diagnosis: Unsteadiness on feet (R26.81);Pain Pain - Right/Left: Right Pain - part of body: Shoulder    Time: 8937-3428 PT Time Calculation (min) (ACUTE ONLY): 31 min   Charges:   PT Evaluation $PT Eval Moderate Complexity: 1 Procedure     PT G Codes:        Wray Kearns, PT,  DPT [phone]    Marguarite Arbour A Lori-Ann Lindfors 11/08/2016, 2:52 PM

## 2016-11-08 NOTE — Progress Notes (Signed)
Patient transferred to [facility] with belongings. When I asked patient if he would like me to call his wife, he stated he would call her when he got to his new room

## 2016-11-08 NOTE — Progress Notes (Signed)
Subjective: Patient reports doing well.    Objective: Vital signs in last 24 hours: Temp:  [97.5 F (36.4 C)-97.9 F (36.6 C)] 97.9 F (36.6 C) (04/06 0800) Pulse Rate:  [55-77] 66 (04/06 0920) Resp:  [11-24] 18 (04/06 0900) BP: (103-148)/(60-84) 116/61 (04/06 0920) SpO2:  [85 %-100 %] 98 % (04/06 0900) Weight:  [90.7 kg (200 lb)-92.9 kg (204 lb 12.9 oz)] 92.9 kg (204 lb 12.9 oz) (04/05 1639)  Intake/Output from previous day: 04/05 0701 - 04/06 0700 In: 1098.3 [P.O.:440; I.V.:658.3] Out: 975 [Urine:975] Intake/Output this shift: No intake/output data recorded.  Physical Exam: Awake, alert, conversant.  MAEW without drift.  Right racoon eye improving as is scleral hemorrhage.  PERRL, EOMI.  No complaints of difficulty with vision.  Lab Results:  Recent Labs  11/07/16 1231 11/08/16 0205  WBC 8.5 8.9  HGB 13.7 11.9*  HCT 41.9 36.8*  PLT 141* 123*   BMET  Recent Labs  11/07/16 1231 11/08/16 0205  NA 137 136  K 3.7 3.7  CL 98* 100*  CO2 30 28  GLUCOSE 115* 128*  BUN 22* 15  CREATININE 1.47* 1.10  CALCIUM 8.9 8.5*    Studies/Results: Dg Ribs Unilateral W/chest Right  Result Date: 11/07/2016 CLINICAL DATA:  Right rib cage pain and bruising following a fall. EXAM: RIGHT RIBS AND CHEST - 3+ VIEW COMPARISON:  Chest x-ray of September 16, 2016 FINDINGS: There is a persistent small right pleural effusion which appears stable. There is thickening of the minor fissure. There is no pneumothorax. The left lung is clear. The cardiac silhouette is enlarged. The pulmonary vascularity is normal. There is calcification in the wall of the aortic arch. There is an acute fracture of the right fifth rib laterally. The adjacent ribs are intact. IMPRESSION: The patient has sustained an acute anterolateral right fifth rib fracture. There is a pre-existing small right pleural effusion there is no pneumothorax. Cardiomegaly without pulmonary edema. Thoracic aortic atherosclerosis.  Electronically Signed   By: David  Martinique M.D.   On: 11/07/2016 13:26   Ct Head Without Contrast  Result Date: 11/08/2016 CLINICAL DATA:  83 y/o  M; fall with intracranial hemorrhage. EXAM: CT HEAD WITHOUT CONTRAST TECHNIQUE: Contiguous axial images were obtained from the base of the skull through the vertex without intravenous contrast. COMPARISON:  11/07/2016 CT of the head FINDINGS: Brain: Stable low-attenuation right convexity collection compatible with hygroma. Subdural hemorrhage along the falx has redistributed over the right parietal lobe and right tentorium cerebelli. Stable small foci of subarachnoid hemorrhage present over the left lateral frontal lobe and in the left sylvian fissure. There is a new small focus of hemorrhage within the right quadrigeminal plate cistern (series 3, image 13). Stable mass effect on the right cerebral hemisphere with sulcal effacement and minimal right-to-left midline shift. No evidence for large territory infarct of the brain or new acute brain parenchymal hemorrhage. Vascular: Mild calcific atherosclerosis of the cavernous internal carotid arteries. Skull: Normal. Negative for fracture or focal lesion. Sinuses/Orbits: Mucous retention cysts in the maxillary sinuses and patchy opacification of ethmoid air cells. Bilateral intra-ocular lens replacement. Other: None. IMPRESSION: 1. New small focus of hemorrhage within the right quadrigeminal plate cistern. Additional short interval follow-up recommended to ensure stability. 2. Stable low-attenuation right convexity subdural hygroma. 3. Redistribution of subdural hemorrhage along the falx to the right parietal convexity and tentorium cerebelli with grossly stable volume. 4. Stable small volume of subarachnoid hemorrhage over left lateral frontal lobes and left sylvian fissure. 5. Moderate  paranasal sinus disease. These results will be called to the ordering clinician or representative by the Radiologist Assistant, and  communication documented in the PACS or zVision Dashboard. Electronically Signed   By: Kristine Garbe M.D.   On: 11/08/2016 06:21   Ct Head Wo Contrast  Result Date: 11/07/2016 CLINICAL DATA:  Status post fall today. Laceration above the right eye. Altered mental status. EXAM: CT HEAD WITHOUT CONTRAST CT MAXILLOFACIAL WITHOUT CONTRAST CT CERVICAL SPINE WITHOUT CONTRAST TECHNIQUE: Multidetector CT imaging of the head, cervical spine, and maxillofacial structures were performed using the standard protocol without intravenous contrast. Multiplanar CT image reconstructions of the cervical spine and maxillofacial structures were also generated. COMPARISON:  Head CT scan 12/31/2015. Head, face and cervical spine CT scans 11/23/2012. FINDINGS: CT HEAD FINDINGS Brain: Low attenuating collection over the right convexities consistent with a subdural hygroma measures up to approximately 1.4 cm in thickness with mass effect on the right cerebral convexities, unchanged. However, there is new subdural hemorrhage on the right adjacent to the right frontal convexities. Also seen is new subdural hemorrhage along the right side of the falx posteriorly. A small volume of subarachnoid hemorrhage on the left is identified and best seen in the Sylvian fissure and posterior aspect of the frontal lobe. There is no midline shift or hydrocephalus. No mass or evidence of acute infarction is identified. Bilateral basal ganglia calcifications and calcification in the right cerebellar hemisphere off unchanged. Vascular: Atherosclerosis is noted. Skull: Intact. Other: None. CT MAXILLOFACIAL FINDINGS Osseous: No fracture or mandibular dislocation. No destructive process. Orbits: The patient is status post bilateral lens extraction. The globes are intact. Hematoma about the right eye is identified. Orbital fat is clear. Sinuses: Mild mucosal thickening is seen in the left ethmoid air cells. Small mucous retention cyst or polyp right  maxillary sinus is identified. Mucosal thickening inferior aspect of left maxillary sinus is seen. Soft tissues: As above.  Otherwise unremarkable. CT CERVICAL SPINE FINDINGS Alignment: Trace anterolisthesis C3 on C4 is unchanged since the prior cervical spine CT scan. Alignment is otherwise maintained. Skull base and vertebrae: No acute fracture. No primary bone lesion or focal pathologic process. Soft tissues and spinal canal: No prevertebral fluid or swelling. No visible canal hematoma. Disc levels: The patient is status post C4-5 ACDF. Multilevel loss of disc space height and endplate spurring are seen. There is multilevel facet degenerative disease appearing worst at C2-3 and C3-4. Upper chest: Lung apices are clear. Other: None. IMPRESSION: Small volume of subdural hemorrhage on the right superimposed on a subdural hygroma. Mass effect on the right cerebral convexities is unchanged compared the prior exam. Small volume of subarachnoid hemorrhage on the left. Soft tissue contusion about the right eye.  Negative for fracture. No acute abnormality cervical spine. Atrophy and chronic microvascular ischemic change. Atherosclerosis. Multilevel cervical spondylosis. Critical Value/emergent results were called by telephone at the time of interpretation on 11/07/2016 at 1:29 pm to Dr. [name] , who verbally acknowledged these results. Electronically Signed   By: Inge Rise M.D.   On: 11/07/2016 13:33   Ct Cervical Spine Wo Contrast  Result Date: 11/07/2016 CLINICAL DATA:  Status post fall today. Laceration above the right eye. Altered mental status. EXAM: CT HEAD WITHOUT CONTRAST CT MAXILLOFACIAL WITHOUT CONTRAST CT CERVICAL SPINE WITHOUT CONTRAST TECHNIQUE: Multidetector CT imaging of the head, cervical spine, and maxillofacial structures were performed using the standard protocol without intravenous contrast. Multiplanar CT image reconstructions of the cervical spine and maxillofacial structures were also  generated. COMPARISON:  Head CT scan 12/31/2015. Head, face and cervical spine CT scans 11/23/2012. FINDINGS: CT HEAD FINDINGS Brain: Low attenuating collection over the right convexities consistent with a subdural hygroma measures up to approximately 1.4 cm in thickness with mass effect on the right cerebral convexities, unchanged. However, there is new subdural hemorrhage on the right adjacent to the right frontal convexities. Also seen is new subdural hemorrhage along the right side of the falx posteriorly. A small volume of subarachnoid hemorrhage on the left is identified and best seen in the Sylvian fissure and posterior aspect of the frontal lobe. There is no midline shift or hydrocephalus. No mass or evidence of acute infarction is identified. Bilateral basal ganglia calcifications and calcification in the right cerebellar hemisphere off unchanged. Vascular: Atherosclerosis is noted. Skull: Intact. Other: None. CT MAXILLOFACIAL FINDINGS Osseous: No fracture or mandibular dislocation. No destructive process. Orbits: The patient is status post bilateral lens extraction. The globes are intact. Hematoma about the right eye is identified. Orbital fat is clear. Sinuses: Mild mucosal thickening is seen in the left ethmoid air cells. Small mucous retention cyst or polyp right maxillary sinus is identified. Mucosal thickening inferior aspect of left maxillary sinus is seen. Soft tissues: As above.  Otherwise unremarkable. CT CERVICAL SPINE FINDINGS Alignment: Trace anterolisthesis C3 on C4 is unchanged since the prior cervical spine CT scan. Alignment is otherwise maintained. Skull base and vertebrae: No acute fracture. No primary bone lesion or focal pathologic process. Soft tissues and spinal canal: No prevertebral fluid or swelling. No visible canal hematoma. Disc levels: The patient is status post C4-5 ACDF. Multilevel loss of disc space height and endplate spurring are seen. There is multilevel facet  degenerative disease appearing worst at C2-3 and C3-4. Upper chest: Lung apices are clear. Other: None. IMPRESSION: Small volume of subdural hemorrhage on the right superimposed on a subdural hygroma. Mass effect on the right cerebral convexities is unchanged compared the prior exam. Small volume of subarachnoid hemorrhage on the left. Soft tissue contusion about the right eye.  Negative for fracture. No acute abnormality cervical spine. Atrophy and chronic microvascular ischemic change. Atherosclerosis. Multilevel cervical spondylosis. Critical Value/emergent results were called by telephone at the time of interpretation on 11/07/2016 at 1:29 pm to Dr. [name] , who verbally acknowledged these results. Electronically Signed   By: Inge Rise M.D.   On: 11/07/2016 13:33   Dg Chest Port 1 View  Result Date: 11/08/2016 CLINICAL DATA:  Right-sided rib fracture EXAM: PORTABLE CHEST 1 VIEW COMPARISON:  11/07/2016 FINDINGS: The cardio pericardial silhouette is enlarged. No other edema. There is right base atelectasis/infiltrate with small right pleural effusion, stable. Previously identified right rib fractures evident. Bones diffusely demineralized. Telemetry leads overlie the chest. IMPRESSION: Cardiomegaly with atelectasis or infiltrate at the right base and small right pleural effusion. No evidence for pneumothorax. Electronically Signed   By: Misty Stanley M.D.   On: 11/08/2016 09:27   Ct Maxillofacial Wo Contrast  Result Date: 11/07/2016 CLINICAL DATA:  Status post fall today. Laceration above the right eye. Altered mental status. EXAM: CT HEAD WITHOUT CONTRAST CT MAXILLOFACIAL WITHOUT CONTRAST CT CERVICAL SPINE WITHOUT CONTRAST TECHNIQUE: Multidetector CT imaging of the head, cervical spine, and maxillofacial structures were performed using the standard protocol without intravenous contrast. Multiplanar CT image reconstructions of the cervical spine and maxillofacial structures were also generated.  COMPARISON:  Head CT scan 12/31/2015. Head, face and cervical spine CT scans 11/23/2012. FINDINGS: CT HEAD FINDINGS Brain: Low attenuating  collection over the right convexities consistent with a subdural hygroma measures up to approximately 1.4 cm in thickness with mass effect on the right cerebral convexities, unchanged. However, there is new subdural hemorrhage on the right adjacent to the right frontal convexities. Also seen is new subdural hemorrhage along the right side of the falx posteriorly. A small volume of subarachnoid hemorrhage on the left is identified and best seen in the Sylvian fissure and posterior aspect of the frontal lobe. There is no midline shift or hydrocephalus. No mass or evidence of acute infarction is identified. Bilateral basal ganglia calcifications and calcification in the right cerebellar hemisphere off unchanged. Vascular: Atherosclerosis is noted. Skull: Intact. Other: None. CT MAXILLOFACIAL FINDINGS Osseous: No fracture or mandibular dislocation. No destructive process. Orbits: The patient is status post bilateral lens extraction. The globes are intact. Hematoma about the right eye is identified. Orbital fat is clear. Sinuses: Mild mucosal thickening is seen in the left ethmoid air cells. Small mucous retention cyst or polyp right maxillary sinus is identified. Mucosal thickening inferior aspect of left maxillary sinus is seen. Soft tissues: As above.  Otherwise unremarkable. CT CERVICAL SPINE FINDINGS Alignment: Trace anterolisthesis C3 on C4 is unchanged since the prior cervical spine CT scan. Alignment is otherwise maintained. Skull base and vertebrae: No acute fracture. No primary bone lesion or focal pathologic process. Soft tissues and spinal canal: No prevertebral fluid or swelling. No visible canal hematoma. Disc levels: The patient is status post C4-5 ACDF. Multilevel loss of disc space height and endplate spurring are seen. There is multilevel facet degenerative disease  appearing worst at C2-3 and C3-4. Upper chest: Lung apices are clear. Other: None. IMPRESSION: Small volume of subdural hemorrhage on the right superimposed on a subdural hygroma. Mass effect on the right cerebral convexities is unchanged compared the prior exam. Small volume of subarachnoid hemorrhage on the left. Soft tissue contusion about the right eye.  Negative for fracture. No acute abnormality cervical spine. Atrophy and chronic microvascular ischemic change. Atherosclerosis. Multilevel cervical spondylosis. Critical Value/emergent results were called by telephone at the time of interpretation on 11/07/2016 at 1:29 pm to Dr. [name] , who verbally acknowledged these results. Electronically Signed   By: Inge Rise M.D.   On: 11/07/2016 13:33    Assessment/Plan: Doing well.  Mobilize.  Transfer from ICU.  Will recheck head CT in am due to new quadrigeminal plate cistern SAH.  Other SAH stable, as is chronic right side hygroma.    LOS: 1 day    Peggyann Shoals, MD 11/08/2016, 11:18 AM

## 2016-11-08 NOTE — Progress Notes (Signed)
Trauma Service Note  Subjective: Patient is awake and alert.  No distress.  GCS 15  Objective: Vital signs in last 24 hours: Temp:  [97.5 F (36.4 C)-97.7 F (36.5 C)] 97.7 F (36.5 C) (04/06 0400) Pulse Rate:  [55-77] 73 (04/06 0800) Resp:  [11-24] 16 (04/06 0800) BP: (103-148)/(60-84) 115/77 (04/06 0800) SpO2:  [85 %-100 %] 100 % (04/06 0800) Weight:  [90.7 kg (200 lb)-92.9 kg (204 lb 12.9 oz)] 92.9 kg (204 lb 12.9 oz) (04/05 1639) Last BM Date: 11/07/16  Intake/Output from previous day: 04/05 0701 - 04/06 0700 In: 1098.3 [P.O.:440; I.V.:658.3] Out: 975 [Urine:975] Intake/Output this shift: No intake/output data recorded.  General: No distress.  Has some pain in his right upper back with movement.  Lungs: Clear to auscultation.  CXR shows no worsening of effusion on the right side  Abd: Benign  Extremities: No changes  Neuro: Intact, GCS 15  Lab Results: CBC   Recent Labs  11/07/16 1231 11/08/16 0205  WBC 8.5 8.9  HGB 13.7 11.9*  HCT 41.9 36.8*  PLT 141* 123*   BMET  Recent Labs  11/07/16 1231 11/08/16 0205  NA 137 136  K 3.7 3.7  CL 98* 100*  CO2 30 28  GLUCOSE 115* 128*  BUN 22* 15  CREATININE 1.47* 1.10  CALCIUM 8.9 8.5*   PT/INR  Recent Labs  11/08/16 0205 11/08/16 0740  LABPROT 15.7* 15.5*  INR 1.24 1.22   ABG No results for input(s): PHART, HCO3 in the last 72 hours.  Invalid input(s): PCO2, PO2  Studies/Results: Dg Ribs Unilateral W/chest Right  Result Date: 11/07/2016 CLINICAL DATA:  Right rib cage pain and bruising following a fall. EXAM: RIGHT RIBS AND CHEST - 3+ VIEW COMPARISON:  Chest x-ray of September 16, 2016 FINDINGS: There is a persistent small right pleural effusion which appears stable. There is thickening of the minor fissure. There is no pneumothorax. The left lung is clear. The cardiac silhouette is enlarged. The pulmonary vascularity is normal. There is calcification in the wall of the aortic arch. There is an acute  fracture of the right fifth rib laterally. The adjacent ribs are intact. IMPRESSION: The patient has sustained an acute anterolateral right fifth rib fracture. There is a pre-existing small right pleural effusion there is no pneumothorax. Cardiomegaly without pulmonary edema. Thoracic aortic atherosclerosis. Electronically Signed   By: David  Martinique M.D.   On: 11/07/2016 13:26   Ct Head Without Contrast  Result Date: 11/08/2016 CLINICAL DATA:  83 y/o  M; fall with intracranial hemorrhage. EXAM: CT HEAD WITHOUT CONTRAST TECHNIQUE: Contiguous axial images were obtained from the base of the skull through the vertex without intravenous contrast. COMPARISON:  11/07/2016 CT of the head FINDINGS: Brain: Stable low-attenuation right convexity collection compatible with hygroma. Subdural hemorrhage along the falx has redistributed over the right parietal lobe and right tentorium cerebelli. Stable small foci of subarachnoid hemorrhage present over the left lateral frontal lobe and in the left sylvian fissure. There is a new small focus of hemorrhage within the right quadrigeminal plate cistern (series 3, image 13). Stable mass effect on the right cerebral hemisphere with sulcal effacement and minimal right-to-left midline shift. No evidence for large territory infarct of the brain or new acute brain parenchymal hemorrhage. Vascular: Mild calcific atherosclerosis of the cavernous internal carotid arteries. Skull: Normal. Negative for fracture or focal lesion. Sinuses/Orbits: Mucous retention cysts in the maxillary sinuses and patchy opacification of ethmoid air cells. Bilateral intra-ocular lens replacement. Other: None. IMPRESSION:  1. New small focus of hemorrhage within the right quadrigeminal plate cistern. Additional short interval follow-up recommended to ensure stability. 2. Stable low-attenuation right convexity subdural hygroma. 3. Redistribution of subdural hemorrhage along the falx to the right parietal convexity  and tentorium cerebelli with grossly stable volume. 4. Stable small volume of subarachnoid hemorrhage over left lateral frontal lobes and left sylvian fissure. 5. Moderate paranasal sinus disease. These results will be called to the ordering clinician or representative by the Radiologist Assistant, and communication documented in the PACS or zVision Dashboard. Electronically Signed   By: Kristine Garbe M.D.   On: 11/08/2016 06:21   Ct Head Wo Contrast  Result Date: 11/07/2016 CLINICAL DATA:  Status post fall today. Laceration above the right eye. Altered mental status. EXAM: CT HEAD WITHOUT CONTRAST CT MAXILLOFACIAL WITHOUT CONTRAST CT CERVICAL SPINE WITHOUT CONTRAST TECHNIQUE: Multidetector CT imaging of the head, cervical spine, and maxillofacial structures were performed using the standard protocol without intravenous contrast. Multiplanar CT image reconstructions of the cervical spine and maxillofacial structures were also generated. COMPARISON:  Head CT scan 12/31/2015. Head, face and cervical spine CT scans 11/23/2012. FINDINGS: CT HEAD FINDINGS Brain: Low attenuating collection over the right convexities consistent with a subdural hygroma measures up to approximately 1.4 cm in thickness with mass effect on the right cerebral convexities, unchanged. However, there is new subdural hemorrhage on the right adjacent to the right frontal convexities. Also seen is new subdural hemorrhage along the right side of the falx posteriorly. A small volume of subarachnoid hemorrhage on the left is identified and best seen in the Sylvian fissure and posterior aspect of the frontal lobe. There is no midline shift or hydrocephalus. No mass or evidence of acute infarction is identified. Bilateral basal ganglia calcifications and calcification in the right cerebellar hemisphere off unchanged. Vascular: Atherosclerosis is noted. Skull: Intact. Other: None. CT MAXILLOFACIAL FINDINGS Osseous: No fracture or mandibular  dislocation. No destructive process. Orbits: The patient is status post bilateral lens extraction. The globes are intact. Hematoma about the right eye is identified. Orbital fat is clear. Sinuses: Mild mucosal thickening is seen in the left ethmoid air cells. Small mucous retention cyst or polyp right maxillary sinus is identified. Mucosal thickening inferior aspect of left maxillary sinus is seen. Soft tissues: As above.  Otherwise unremarkable. CT CERVICAL SPINE FINDINGS Alignment: Trace anterolisthesis C3 on C4 is unchanged since the prior cervical spine CT scan. Alignment is otherwise maintained. Skull base and vertebrae: No acute fracture. No primary bone lesion or focal pathologic process. Soft tissues and spinal canal: No prevertebral fluid or swelling. No visible canal hematoma. Disc levels: The patient is status post C4-5 ACDF. Multilevel loss of disc space height and endplate spurring are seen. There is multilevel facet degenerative disease appearing worst at C2-3 and C3-4. Upper chest: Lung apices are clear. Other: None. IMPRESSION: Small volume of subdural hemorrhage on the right superimposed on a subdural hygroma. Mass effect on the right cerebral convexities is unchanged compared the prior exam. Small volume of subarachnoid hemorrhage on the left. Soft tissue contusion about the right eye.  Negative for fracture. No acute abnormality cervical spine. Atrophy and chronic microvascular ischemic change. Atherosclerosis. Multilevel cervical spondylosis. Critical Value/emergent results were called by telephone at the time of interpretation on 11/07/2016 at 1:29 pm to Dr. [name] , who verbally acknowledged these results. Electronically Signed   By: Inge Rise M.D.   On: 11/07/2016 13:33   Ct Cervical Spine Wo Contrast  Result  Date: 11/07/2016 CLINICAL DATA:  Status post fall today. Laceration above the right eye. Altered mental status. EXAM: CT HEAD WITHOUT CONTRAST CT MAXILLOFACIAL WITHOUT  CONTRAST CT CERVICAL SPINE WITHOUT CONTRAST TECHNIQUE: Multidetector CT imaging of the head, cervical spine, and maxillofacial structures were performed using the standard protocol without intravenous contrast. Multiplanar CT image reconstructions of the cervical spine and maxillofacial structures were also generated. COMPARISON:  Head CT scan 12/31/2015. Head, face and cervical spine CT scans 11/23/2012. FINDINGS: CT HEAD FINDINGS Brain: Low attenuating collection over the right convexities consistent with a subdural hygroma measures up to approximately 1.4 cm in thickness with mass effect on the right cerebral convexities, unchanged. However, there is new subdural hemorrhage on the right adjacent to the right frontal convexities. Also seen is new subdural hemorrhage along the right side of the falx posteriorly. A small volume of subarachnoid hemorrhage on the left is identified and best seen in the Sylvian fissure and posterior aspect of the frontal lobe. There is no midline shift or hydrocephalus. No mass or evidence of acute infarction is identified. Bilateral basal ganglia calcifications and calcification in the right cerebellar hemisphere off unchanged. Vascular: Atherosclerosis is noted. Skull: Intact. Other: None. CT MAXILLOFACIAL FINDINGS Osseous: No fracture or mandibular dislocation. No destructive process. Orbits: The patient is status post bilateral lens extraction. The globes are intact. Hematoma about the right eye is identified. Orbital fat is clear. Sinuses: Mild mucosal thickening is seen in the left ethmoid air cells. Small mucous retention cyst or polyp right maxillary sinus is identified. Mucosal thickening inferior aspect of left maxillary sinus is seen. Soft tissues: As above.  Otherwise unremarkable. CT CERVICAL SPINE FINDINGS Alignment: Trace anterolisthesis C3 on C4 is unchanged since the prior cervical spine CT scan. Alignment is otherwise maintained. Skull base and vertebrae: No acute  fracture. No primary bone lesion or focal pathologic process. Soft tissues and spinal canal: No prevertebral fluid or swelling. No visible canal hematoma. Disc levels: The patient is status post C4-5 ACDF. Multilevel loss of disc space height and endplate spurring are seen. There is multilevel facet degenerative disease appearing worst at C2-3 and C3-4. Upper chest: Lung apices are clear. Other: None. IMPRESSION: Small volume of subdural hemorrhage on the right superimposed on a subdural hygroma. Mass effect on the right cerebral convexities is unchanged compared the prior exam. Small volume of subarachnoid hemorrhage on the left. Soft tissue contusion about the right eye.  Negative for fracture. No acute abnormality cervical spine. Atrophy and chronic microvascular ischemic change. Atherosclerosis. Multilevel cervical spondylosis. Critical Value/emergent results were called by telephone at the time of interpretation on 11/07/2016 at 1:29 pm to Dr. [name] , who verbally acknowledged these results. Electronically Signed   By: Inge Rise M.D.   On: 11/07/2016 13:33   Ct Maxillofacial Wo Contrast  Result Date: 11/07/2016 CLINICAL DATA:  Status post fall today. Laceration above the right eye. Altered mental status. EXAM: CT HEAD WITHOUT CONTRAST CT MAXILLOFACIAL WITHOUT CONTRAST CT CERVICAL SPINE WITHOUT CONTRAST TECHNIQUE: Multidetector CT imaging of the head, cervical spine, and maxillofacial structures were performed using the standard protocol without intravenous contrast. Multiplanar CT image reconstructions of the cervical spine and maxillofacial structures were also generated. COMPARISON:  Head CT scan 12/31/2015. Head, face and cervical spine CT scans 11/23/2012. FINDINGS: CT HEAD FINDINGS Brain: Low attenuating collection over the right convexities consistent with a subdural hygroma measures up to approximately 1.4 cm in thickness with mass effect on the right cerebral convexities, unchanged.  However, there is new subdural hemorrhage on the right adjacent to the right frontal convexities. Also seen is new subdural hemorrhage along the right side of the falx posteriorly. A small volume of subarachnoid hemorrhage on the left is identified and best seen in the Sylvian fissure and posterior aspect of the frontal lobe. There is no midline shift or hydrocephalus. No mass or evidence of acute infarction is identified. Bilateral basal ganglia calcifications and calcification in the right cerebellar hemisphere off unchanged. Vascular: Atherosclerosis is noted. Skull: Intact. Other: None. CT MAXILLOFACIAL FINDINGS Osseous: No fracture or mandibular dislocation. No destructive process. Orbits: The patient is status post bilateral lens extraction. The globes are intact. Hematoma about the right eye is identified. Orbital fat is clear. Sinuses: Mild mucosal thickening is seen in the left ethmoid air cells. Small mucous retention cyst or polyp right maxillary sinus is identified. Mucosal thickening inferior aspect of left maxillary sinus is seen. Soft tissues: As above.  Otherwise unremarkable. CT CERVICAL SPINE FINDINGS Alignment: Trace anterolisthesis C3 on C4 is unchanged since the prior cervical spine CT scan. Alignment is otherwise maintained. Skull base and vertebrae: No acute fracture. No primary bone lesion or focal pathologic process. Soft tissues and spinal canal: No prevertebral fluid or swelling. No visible canal hematoma. Disc levels: The patient is status post C4-5 ACDF. Multilevel loss of disc space height and endplate spurring are seen. There is multilevel facet degenerative disease appearing worst at C2-3 and C3-4. Upper chest: Lung apices are clear. Other: None. IMPRESSION: Small volume of subdural hemorrhage on the right superimposed on a subdural hygroma. Mass effect on the right cerebral convexities is unchanged compared the prior exam. Small volume of subarachnoid hemorrhage on the left. Soft  tissue contusion about the right eye.  Negative for fracture. No acute abnormality cervical spine. Atrophy and chronic microvascular ischemic change. Atherosclerosis. Multilevel cervical spondylosis. Critical Value/emergent results were called by telephone at the time of interpretation on 11/07/2016 at 1:29 pm to Dr. [name] , who verbally acknowledged these results. Electronically Signed   By: Inge Rise M.D.   On: 11/07/2016 13:33    Anti-infectives: Anti-infectives    None      Assessment/Plan: s/p  Advance diet PT/OT/ST  Transfer out of unit,.  LOS: 1 day   Kathryne Eriksson. Dahlia Bailiff, MD, FACS [phone] Trauma Surgeon 11/08/2016

## 2016-11-09 ENCOUNTER — Inpatient Hospital Stay (HOSPITAL_COMMUNITY): Payer: Medicare Other

## 2016-11-09 LAB — PROTIME-INR
INR: 1.24
Prothrombin Time: 15.7 seconds — ABNORMAL HIGH (ref 11.4–15.2)

## 2016-11-09 NOTE — Progress Notes (Signed)
Vitals:   11/08/16 2000 11/08/16 2030 11/09/16 0124 11/09/16 0526  BP: 132/67 (!) 109/91 111/65 120/66  Pulse: 73 79 86 73  Resp: 16 20 20 18   Temp: 97.6 F (36.4 C) 98 F (36.7 C) 97.5 F (36.4 C) 97.9 F (36.6 C)  TempSrc: Oral Oral Oral Oral  SpO2: 94% 96% 98% 99%  Weight:      Height:        CBC  Recent Labs  11/07/16 1231 11/08/16 0205  WBC 8.5 8.9  HGB 13.7 11.9*  HCT 41.9 36.8*  PLT 141* 123*   BMET  Recent Labs  11/07/16 1231 11/08/16 0205  NA 137 136  K 3.7 3.7  CL 98* 100*  CO2 30 28  GLUCOSE 115* 128*  BUN 22* 15  CREATININE 1.47* 1.10  CALCIUM 8.9 8.5*    Patient resting in bed, some mild discomfort from injuries. Awake, oriented to name, hospital, 2018. Following commands. Moving all 4 extremities.  CT of brain without consciousness morning shows early clearing of traumatic subarachnoid hemorrhage. Right hemispheric chronic subdural hygroma stable.  Plan: Stable from neurosurgical perspective. Continued follow-up with Dr. Erline Levine.  Hosie Spangle, MD 11/09/2016, 8:55 AM

## 2016-11-09 NOTE — Progress Notes (Signed)
Subjective: Comfortable No complaints Tolerating po  Objective: Vital signs in last 24 hours: Temp:  [97.5 F (36.4 C)-98 F (36.7 C)] 97.9 F (36.6 C) (04/07 0526) Pulse Rate:  [60-86] 73 (04/07 0526) Resp:  [13-20] 18 (04/07 0526) BP: (98-138)/(53-91) 120/66 (04/07 0526) SpO2:  [94 %-100 %] 99 % (04/07 0526) Last BM Date: 11/07/16  Intake/Output from previous day: 04/06 0701 - 04/07 0700 In: 910.8 [P.O.:600; I.V.:310.8] Out: 1500 [Urine:1500] Intake/Output this shift: No intake/output data recorded.  Exam: Awake and alert Follows commands Lungs clear Abdomen soft, NT  Lab Results:   Recent Labs  11/07/16 1231 11/08/16 0205  WBC 8.5 8.9  HGB 13.7 11.9*  HCT 41.9 36.8*  PLT 141* 123*   BMET  Recent Labs  11/07/16 1231 11/08/16 0205  NA 137 136  K 3.7 3.7  CL 98* 100*  CO2 30 28  GLUCOSE 115* 128*  BUN 22* 15  CREATININE 1.47* 1.10  CALCIUM 8.9 8.5*   PT/INR  Recent Labs  11/08/16 1438 11/09/16 0511  LABPROT 16.2* 15.7*  INR 1.29 1.24   ABG No results for input(s): PHART, HCO3 in the last 72 hours.  Invalid input(s): PCO2, PO2  Studies/Results: Dg Ribs Unilateral W/chest Right  Result Date: 11/07/2016 CLINICAL DATA:  Right rib cage pain and bruising following a fall. EXAM: RIGHT RIBS AND CHEST - 3+ VIEW COMPARISON:  Chest x-ray of September 16, 2016 FINDINGS: There is a persistent small right pleural effusion which appears stable. There is thickening of the minor fissure. There is no pneumothorax. The left lung is clear. The cardiac silhouette is enlarged. The pulmonary vascularity is normal. There is calcification in the wall of the aortic arch. There is an acute fracture of the right fifth rib laterally. The adjacent ribs are intact. IMPRESSION: The patient has sustained an acute anterolateral right fifth rib fracture. There is a pre-existing small right pleural effusion there is no pneumothorax. Cardiomegaly without pulmonary edema.  Thoracic aortic atherosclerosis. Electronically Signed   By: David  Martinique M.D.   On: 11/07/2016 13:26   Ct Head Wo Contrast  Result Date: 11/09/2016 CLINICAL DATA:  83 y/o  M; intracranial hemorrhage for follow-up. EXAM: CT HEAD WITHOUT CONTRAST TECHNIQUE: Contiguous axial images were obtained from the base of the skull through the vertex without intravenous contrast. COMPARISON:  11/08/2016 CT of the head. FINDINGS: Brain: Interval dispersion of subarachnoid hemorrhage in left sylvian fissure in over left lateral frontal convexity. Stable subdural hemorrhage over the right parietal lobe and right tentorium cerebelli. Stable small focus of hemorrhage within the right quadrigeminal plate cistern. Stable right convexity hygroma. No evidence for new large territory infarct, new intracranial hemorrhage, or significant mass effect. Vascular: Mild calcific atherosclerosis of cavernous internal carotid arteries. Skull: Normal. Negative for fracture or focal lesion. Sinuses/Orbits: Mucous retention cysts within the bilateral maxillary sinuses and patchy opacification of the ethmoid air cells. Bilateral intra-ocular lens replacement. Other: None. IMPRESSION: 1. No evidence for new large territory infarct, new intracranial hemorrhage, or significant mass effect. 2. Interval partial dispersion of subarachnoid hemorrhage in left sylvian fissure in over left frontal convexity. 3. Stable subdural hemorrhage over the right parietal lobe and right tentorium cerebelli. 4. Stable small focus of hemorrhage within right quadrigeminal plate cistern. 5. Stable right convexity hygroma. 6. Stable moderate paranasal sinus disease. Electronically Signed   By: Kristine Garbe M.D.   On: 11/09/2016 06:57   Ct Head Without Contrast  Result Date: 11/08/2016 CLINICAL DATA:  83 y/o  M; fall with intracranial hemorrhage. EXAM: CT HEAD WITHOUT CONTRAST TECHNIQUE: Contiguous axial images were obtained from the base of the skull  through the vertex without intravenous contrast. COMPARISON:  11/07/2016 CT of the head FINDINGS: Brain: Stable low-attenuation right convexity collection compatible with hygroma. Subdural hemorrhage along the falx has redistributed over the right parietal lobe and right tentorium cerebelli. Stable small foci of subarachnoid hemorrhage present over the left lateral frontal lobe and in the left sylvian fissure. There is a new small focus of hemorrhage within the right quadrigeminal plate cistern (series 3, image 13). Stable mass effect on the right cerebral hemisphere with sulcal effacement and minimal right-to-left midline shift. No evidence for large territory infarct of the brain or new acute brain parenchymal hemorrhage. Vascular: Mild calcific atherosclerosis of the cavernous internal carotid arteries. Skull: Normal. Negative for fracture or focal lesion. Sinuses/Orbits: Mucous retention cysts in the maxillary sinuses and patchy opacification of ethmoid air cells. Bilateral intra-ocular lens replacement. Other: None. IMPRESSION: 1. New small focus of hemorrhage within the right quadrigeminal plate cistern. Additional short interval follow-up recommended to ensure stability. 2. Stable low-attenuation right convexity subdural hygroma. 3. Redistribution of subdural hemorrhage along the falx to the right parietal convexity and tentorium cerebelli with grossly stable volume. 4. Stable small volume of subarachnoid hemorrhage over left lateral frontal lobes and left sylvian fissure. 5. Moderate paranasal sinus disease. These results will be called to the ordering clinician or representative by the Radiologist Assistant, and communication documented in the PACS or zVision Dashboard. Electronically Signed   By: Kristine Garbe M.D.   On: 11/08/2016 06:21   Ct Head Wo Contrast  Result Date: 11/07/2016 CLINICAL DATA:  Status post fall today. Laceration above the right eye. Altered mental status. EXAM: CT HEAD  WITHOUT CONTRAST CT MAXILLOFACIAL WITHOUT CONTRAST CT CERVICAL SPINE WITHOUT CONTRAST TECHNIQUE: Multidetector CT imaging of the head, cervical spine, and maxillofacial structures were performed using the standard protocol without intravenous contrast. Multiplanar CT image reconstructions of the cervical spine and maxillofacial structures were also generated. COMPARISON:  Head CT scan 12/31/2015. Head, face and cervical spine CT scans 11/23/2012. FINDINGS: CT HEAD FINDINGS Brain: Low attenuating collection over the right convexities consistent with a subdural hygroma measures up to approximately 1.4 cm in thickness with mass effect on the right cerebral convexities, unchanged. However, there is new subdural hemorrhage on the right adjacent to the right frontal convexities. Also seen is new subdural hemorrhage along the right side of the falx posteriorly. A small volume of subarachnoid hemorrhage on the left is identified and best seen in the Sylvian fissure and posterior aspect of the frontal lobe. There is no midline shift or hydrocephalus. No mass or evidence of acute infarction is identified. Bilateral basal ganglia calcifications and calcification in the right cerebellar hemisphere off unchanged. Vascular: Atherosclerosis is noted. Skull: Intact. Other: None. CT MAXILLOFACIAL FINDINGS Osseous: No fracture or mandibular dislocation. No destructive process. Orbits: The patient is status post bilateral lens extraction. The globes are intact. Hematoma about the right eye is identified. Orbital fat is clear. Sinuses: Mild mucosal thickening is seen in the left ethmoid air cells. Small mucous retention cyst or polyp right maxillary sinus is identified. Mucosal thickening inferior aspect of left maxillary sinus is seen. Soft tissues: As above.  Otherwise unremarkable. CT CERVICAL SPINE FINDINGS Alignment: Trace anterolisthesis C3 on C4 is unchanged since the prior cervical spine CT scan. Alignment is otherwise  maintained. Skull base and vertebrae: No acute fracture. No primary bone  lesion or focal pathologic process. Soft tissues and spinal canal: No prevertebral fluid or swelling. No visible canal hematoma. Disc levels: The patient is status post C4-5 ACDF. Multilevel loss of disc space height and endplate spurring are seen. There is multilevel facet degenerative disease appearing worst at C2-3 and C3-4. Upper chest: Lung apices are clear. Other: None. IMPRESSION: Small volume of subdural hemorrhage on the right superimposed on a subdural hygroma. Mass effect on the right cerebral convexities is unchanged compared the prior exam. Small volume of subarachnoid hemorrhage on the left. Soft tissue contusion about the right eye.  Negative for fracture. No acute abnormality cervical spine. Atrophy and chronic microvascular ischemic change. Atherosclerosis. Multilevel cervical spondylosis. Critical Value/emergent results were called by telephone at the time of interpretation on 11/07/2016 at 1:29 pm to Dr. [name] , who verbally acknowledged these results. Electronically Signed   By: Inge Rise M.D.   On: 11/07/2016 13:33   Ct Cervical Spine Wo Contrast  Result Date: 11/07/2016 CLINICAL DATA:  Status post fall today. Laceration above the right eye. Altered mental status. EXAM: CT HEAD WITHOUT CONTRAST CT MAXILLOFACIAL WITHOUT CONTRAST CT CERVICAL SPINE WITHOUT CONTRAST TECHNIQUE: Multidetector CT imaging of the head, cervical spine, and maxillofacial structures were performed using the standard protocol without intravenous contrast. Multiplanar CT image reconstructions of the cervical spine and maxillofacial structures were also generated. COMPARISON:  Head CT scan 12/31/2015. Head, face and cervical spine CT scans 11/23/2012. FINDINGS: CT HEAD FINDINGS Brain: Low attenuating collection over the right convexities consistent with a subdural hygroma measures up to approximately 1.4 cm in thickness with mass effect on  the right cerebral convexities, unchanged. However, there is new subdural hemorrhage on the right adjacent to the right frontal convexities. Also seen is new subdural hemorrhage along the right side of the falx posteriorly. A small volume of subarachnoid hemorrhage on the left is identified and best seen in the Sylvian fissure and posterior aspect of the frontal lobe. There is no midline shift or hydrocephalus. No mass or evidence of acute infarction is identified. Bilateral basal ganglia calcifications and calcification in the right cerebellar hemisphere off unchanged. Vascular: Atherosclerosis is noted. Skull: Intact. Other: None. CT MAXILLOFACIAL FINDINGS Osseous: No fracture or mandibular dislocation. No destructive process. Orbits: The patient is status post bilateral lens extraction. The globes are intact. Hematoma about the right eye is identified. Orbital fat is clear. Sinuses: Mild mucosal thickening is seen in the left ethmoid air cells. Small mucous retention cyst or polyp right maxillary sinus is identified. Mucosal thickening inferior aspect of left maxillary sinus is seen. Soft tissues: As above.  Otherwise unremarkable. CT CERVICAL SPINE FINDINGS Alignment: Trace anterolisthesis C3 on C4 is unchanged since the prior cervical spine CT scan. Alignment is otherwise maintained. Skull base and vertebrae: No acute fracture. No primary bone lesion or focal pathologic process. Soft tissues and spinal canal: No prevertebral fluid or swelling. No visible canal hematoma. Disc levels: The patient is status post C4-5 ACDF. Multilevel loss of disc space height and endplate spurring are seen. There is multilevel facet degenerative disease appearing worst at C2-3 and C3-4. Upper chest: Lung apices are clear. Other: None. IMPRESSION: Small volume of subdural hemorrhage on the right superimposed on a subdural hygroma. Mass effect on the right cerebral convexities is unchanged compared the prior exam. Small volume of  subarachnoid hemorrhage on the left. Soft tissue contusion about the right eye.  Negative for fracture. No acute abnormality cervical spine. Atrophy and chronic microvascular  ischemic change. Atherosclerosis. Multilevel cervical spondylosis. Critical Value/emergent results were called by telephone at the time of interpretation on 11/07/2016 at 1:29 pm to Dr. [name] , who verbally acknowledged these results. Electronically Signed   By: Inge Rise M.D.   On: 11/07/2016 13:33   Dg Chest Port 1 View  Result Date: 11/08/2016 CLINICAL DATA:  Right-sided rib fracture EXAM: PORTABLE CHEST 1 VIEW COMPARISON:  11/07/2016 FINDINGS: The cardio pericardial silhouette is enlarged. No other edema. There is right base atelectasis/infiltrate with small right pleural effusion, stable. Previously identified right rib fractures evident. Bones diffusely demineralized. Telemetry leads overlie the chest. IMPRESSION: Cardiomegaly with atelectasis or infiltrate at the right base and small right pleural effusion. No evidence for pneumothorax. Electronically Signed   By: Misty Stanley M.D.   On: 11/08/2016 09:27   Ct Maxillofacial Wo Contrast  Result Date: 11/07/2016 CLINICAL DATA:  Status post fall today. Laceration above the right eye. Altered mental status. EXAM: CT HEAD WITHOUT CONTRAST CT MAXILLOFACIAL WITHOUT CONTRAST CT CERVICAL SPINE WITHOUT CONTRAST TECHNIQUE: Multidetector CT imaging of the head, cervical spine, and maxillofacial structures were performed using the standard protocol without intravenous contrast. Multiplanar CT image reconstructions of the cervical spine and maxillofacial structures were also generated. COMPARISON:  Head CT scan 12/31/2015. Head, face and cervical spine CT scans 11/23/2012. FINDINGS: CT HEAD FINDINGS Brain: Low attenuating collection over the right convexities consistent with a subdural hygroma measures up to approximately 1.4 cm in thickness with mass effect on the right cerebral  convexities, unchanged. However, there is new subdural hemorrhage on the right adjacent to the right frontal convexities. Also seen is new subdural hemorrhage along the right side of the falx posteriorly. A small volume of subarachnoid hemorrhage on the left is identified and best seen in the Sylvian fissure and posterior aspect of the frontal lobe. There is no midline shift or hydrocephalus. No mass or evidence of acute infarction is identified. Bilateral basal ganglia calcifications and calcification in the right cerebellar hemisphere off unchanged. Vascular: Atherosclerosis is noted. Skull: Intact. Other: None. CT MAXILLOFACIAL FINDINGS Osseous: No fracture or mandibular dislocation. No destructive process. Orbits: The patient is status post bilateral lens extraction. The globes are intact. Hematoma about the right eye is identified. Orbital fat is clear. Sinuses: Mild mucosal thickening is seen in the left ethmoid air cells. Small mucous retention cyst or polyp right maxillary sinus is identified. Mucosal thickening inferior aspect of left maxillary sinus is seen. Soft tissues: As above.  Otherwise unremarkable. CT CERVICAL SPINE FINDINGS Alignment: Trace anterolisthesis C3 on C4 is unchanged since the prior cervical spine CT scan. Alignment is otherwise maintained. Skull base and vertebrae: No acute fracture. No primary bone lesion or focal pathologic process. Soft tissues and spinal canal: No prevertebral fluid or swelling. No visible canal hematoma. Disc levels: The patient is status post C4-5 ACDF. Multilevel loss of disc space height and endplate spurring are seen. There is multilevel facet degenerative disease appearing worst at C2-3 and C3-4. Upper chest: Lung apices are clear. Other: None. IMPRESSION: Small volume of subdural hemorrhage on the right superimposed on a subdural hygroma. Mass effect on the right cerebral convexities is unchanged compared the prior exam. Small volume of subarachnoid  hemorrhage on the left. Soft tissue contusion about the right eye.  Negative for fracture. No acute abnormality cervical spine. Atrophy and chronic microvascular ischemic change. Atherosclerosis. Multilevel cervical spondylosis. Critical Value/emergent results were called by telephone at the time of interpretation on 11/07/2016 at 1:29  pm to Dr. [name] , who verbally acknowledged these results. Electronically Signed   By: Inge Rise M.D.   On: 11/07/2016 13:33    Anti-infectives: Anti-infectives    None      Assessment/Plan: s/p * No surgery found *  s/p fall with TBI, right rib fracture, multiple med issues  Continues to improve Patient wants to leave the foley in another day or so given the lack of feeling chronically in his right hand  LOS: 2 days    [name] A 11/09/2016

## 2016-11-09 NOTE — Progress Notes (Signed)
Occupational Therapy Treatment Patient Details Name: SAVON BORDONARO MRN: [id number] DOB: 1929/02/04 Today's Date: 11/09/2016    History of present illness Patient is a 83 y/o male who presents s/p fall at home. PMH includes CAD, MVR, A-fib, DM, SDH, HF. Head CT-Small volume of subdural hemorrhage on the right superimposed on a chronic subdural hygroma. Head CT 4/6-New small focus of hemorrhage within the right quadrigeminal plate cistern. CXR-right fifth rib fxx.   OT comments  Pt progressing toward OT goals. Able to complete standing oral care tasks with min assist this session and complete ambulating toilet transfers with min assist. Pt continues to demonstrate significant edema and redness in R hand with greatest edema over MCP joints. Educated pt on composite grasp/extension to improve functional use of R hand as well as to address edema. Pt additionally reporting pain in R shoulder and facilitated improved scapular mobility with shoulder elevation and retraction exercises. Pt reported slight relief from these. D/C plan remains appropriate. OT will continue to follow acutely.   Follow Up Recommendations  Home health OT;Supervision/Assistance - 24 hour    Equipment Recommendations  None recommended by OT    Recommendations for Other Services      Precautions / Restrictions Precautions Precautions: Fall       Mobility Bed Mobility Overal bed mobility: Needs Assistance Bed Mobility: Rolling;Sidelying to Sit Rolling: Mod assist Sidelying to sit: Mod assist;HOB elevated       General bed mobility comments: VC's for log roll technique. Increased time for task.  Transfers Overall transfer level: Needs assistance Equipment used: None Transfers: Sit to/from [facility] Sit to Stand: Min assist         General transfer comment: Min assist to steady in standing position.    Balance Overall balance assessment: Needs assistance Sitting-balance support: Feet  supported;Single extremity supported Sitting balance-Leahy Scale: Fair Sitting balance - Comments: Guarded sitting EOB.    Standing balance support: During functional activity;Single extremity supported Standing balance-Leahy Scale: Fair Standing balance comment: Able to perform static standing with 1 UE support for stability.                            ADL either performed or assessed with clinical judgement   ADL Overall ADL's : Needs assistance/impaired     Grooming: Minimal assistance;Standing Grooming Details (indicate cue type and reason): Able to brush dentures standing at sink with min assist.              Lower Body Dressing: Maximal assistance;Sit to/from stand   Toilet Transfer: Minimal assistance;Ambulation;BSC;RW Toilet Transfer Details (indicate cue type and reason): Simulated with sit<>stand from EOB followed by ambulation to chair. Pt required increased time.          Functional mobility during ADLs: Minimal assistance;Rolling walker General ADL Comments: Pt initially reporting that he could not do oral care because of his limitations but was willing to attempt with encouragement. Significant edema in R hand and R shoulder pain remain with movement. Edema greatest at MCP joint and redness/shininess noted. Educated pt on shoulder shrugs and retraction to address scapular mobillity, lap slides to address [place] joint mobility, and composite grasp/release to address edema and decreased digit mobility.      Vision   Additional Comments: Able to use vision functionally throughout session.   Perception     Praxis      Cognition Arousal/Alertness: Awake/alert Behavior During Therapy: WFL for tasks assessed/performed Overall  Cognitive Status: Impaired/Different from baseline Area of Impairment: Memory;Problem solving                     Memory: Decreased short-term memory       Problem Solving: Slow processing General Comments: Pt able to  converse well with OT. He was alert and oriented to time, place, and situation. He was difficult to understand due to dysarthria and does speak quickly.        Exercises     Shoulder Instructions       General Comments      Pertinent Vitals/ Pain       Pain Assessment: Faces Faces Pain Scale: Hurts even more Pain Location: R shoulder, R hand Pain Descriptors / Indicators: Stabbing Pain Intervention(s): Limited activity within patient's tolerance;Monitored during session;Repositioned  Home Living                                          Prior Functioning/Environment              Frequency  Min 2X/week        Progress Toward Goals  OT Goals(current goals can now be found in the care plan section)  Progress towards OT goals: Progressing toward goals  Acute Rehab OT Goals Patient Stated Goal: to make this pain better OT Goal Formulation: With patient Time For Goal Achievement: 11/22/16 Potential to Achieve Goals: Good ADL Goals Pt Will Perform Grooming: with supervision;standing Pt Will Perform Upper Body Bathing: with supervision;sitting Pt Will Perform Lower Body Bathing: with supervision;sit to/from stand Pt Will Transfer to Toilet: with supervision;ambulating;bedside commode (over toilet) Pt Will Perform Toileting - Clothing Manipulation and hygiene: with supervision;sit to/from stand  Plan Discharge plan remains appropriate    Co-evaluation                 End of Session Equipment Utilized During Treatment: Gait belt;Rolling walker  OT Visit Diagnosis: Unsteadiness on feet (R26.81);Repeated falls (R29.6);Pain Pain - Right/Left: Right Pain - part of body: Shoulder;Hand   Activity Tolerance Patient tolerated treatment well   Patient Left in chair;with call bell/phone within reach;with chair alarm set   Nurse Communication Mobility status        Time: 9528-4132 OT Time Calculation (min): 31 min  Charges: OT General  Charges $OT Visit: 1 Procedure OT Treatments $Self Care/Home Management : 23-37 mins  Norman Herrlich, MS OTR/L  Pager: [place] A Agastya Meister 11/09/2016, 4:46 PM

## 2016-11-10 ENCOUNTER — Inpatient Hospital Stay (HOSPITAL_COMMUNITY): Payer: Medicare Other

## 2016-11-10 LAB — PROTIME-INR
INR: 1.26
Prothrombin Time: 15.9 seconds — ABNORMAL HIGH (ref 11.4–15.2)

## 2016-11-10 NOTE — Progress Notes (Signed)
Pt seen and examined.  No neurosurgical issues overnight. Feels well neurologically. No HA, dizziness, changes in vision, focal deficits. Only complains of rib pain - managed by Trauma  EXAM: Temp:  [97.7 F (36.5 C)-98.7 F (37.1 C)] 98.1 F (36.7 C) (04/08 0609) Pulse Rate:  [63-71] 63 (04/08 0609) Resp:  [16-20] 18 (04/08 0609) BP: (116-133)/(56-66) 133/60 (04/08 0609) SpO2:  [95 %-99 %] 99 % (04/08 0609) Intake/Output      04/07 0701 - 04/08 0700 04/08 0701 - 04/09 0700   P.O. 900 240   I.V. (mL/kg)     Total Intake(mL/kg) 900 (9.7) 240 (2.6)   Urine (mL/kg/hr) 350 (0.2)    Total Output 350     Net +550 +240        Urine Occurrence 1 x     Awake and alert Follows commands throughout Right racoon eye and scleral hemorrhage continues to improve MAE well. Strength appropriate.  Stable from NS standpoint Continue current care

## 2016-11-10 NOTE — Progress Notes (Signed)
  Subjective: Having considerable right chest pain trying to cough   Objective: Vital signs in last 24 hours: Temp:  [97.7 F (36.5 C)-98.7 F (37.1 C)] 98.1 F (36.7 C) (04/08 0609) Pulse Rate:  [63-71] 63 (04/08 0609) Resp:  [16-20] 18 (04/08 0609) BP: (116-133)/(56-Fred) 133/60 (04/08 0609) SpO2:  [95 %-99 %] 99 % (04/08 0609) Last BM Date: 11/07/16  Intake/Output from previous day: 04/07 0701 - 04/08 0700 In: 900 [P.O.:900] Out: 350 [Urine:350] Intake/Output this shift: Total I/O In: 240 [P.O.:240] Out: -   Exam: Lungs mostly clear Neuro grossly intact  Lab Results:   Recent Labs  11/07/16 1231 11/08/16 0205  WBC 8.5 8.9  HGB 13.7 11.9*  HCT 41.9 36.8*  PLT 141* 123*   BMET  Recent Labs  11/07/16 1231 11/08/16 0205  NA 137 136  K 3.7 3.7  CL 98* 100*  CO2 30 28  GLUCOSE 115* 128*  BUN 22* 15  CREATININE 1.47* 1.10  CALCIUM 8.9 8.5*   PT/INR  Recent Labs  11/09/16 0511 11/10/16 0251  LABPROT 15.7* 15.9*  INR 1.24 1.26   ABG No results for input(s): PHART, HCO3 in the last 72 hours.  Invalid input(s): PCO2, PO2  Studies/Results: Ct Head Wo Contrast  Result Date: 11/09/2016 CLINICAL DATA:  83 Bradshaw  M; intracranial hemorrhage for follow-up. EXAM: CT HEAD WITHOUT CONTRAST TECHNIQUE: Contiguous axial images were obtained from the base of the skull through the vertex without intravenous contrast. COMPARISON:  11/08/2016 CT of the head. FINDINGS: Brain: Interval dispersion of subarachnoid hemorrhage in left sylvian fissure in over left lateral frontal convexity. Stable subdural hemorrhage over the right parietal lobe and right tentorium cerebelli. Stable small focus of hemorrhage within the right quadrigeminal plate cistern. Stable right convexity hygroma. No evidence for new large territory infarct, new intracranial hemorrhage, or significant mass effect. Vascular: Mild calcific atherosclerosis of cavernous internal carotid arteries. Skull: Normal.  Negative for fracture or focal lesion. Sinuses/Orbits: Mucous retention cysts within the bilateral maxillary sinuses and patchy opacification of the ethmoid air cells. Bilateral intra-ocular lens replacement. Other: None. IMPRESSION: 1. No evidence for new large territory infarct, new intracranial hemorrhage, or significant mass effect. 2. Interval partial dispersion of subarachnoid hemorrhage in left sylvian fissure in over left frontal convexity. 3. Stable subdural hemorrhage over the right parietal lobe and right tentorium cerebelli. 4. Stable small focus of hemorrhage within right quadrigeminal plate cistern. 5. Stable right convexity hygroma. 6. Stable moderate paranasal sinus disease. Electronically Signed   By: Kristine Garbe M.D.   On: 11/09/2016 06:57    Anti-infectives: Anti-infectives    None      Assessment/Plan:  Fall with TBI, rib fracture, multiple med issues  Check chest xray Pulmonary toilet Pain control  LOS: 3 days    Bryan Goin A 11/10/2016

## 2016-11-10 NOTE — Evaluation (Signed)
Speech Language Pathology Evaluation Patient Details Name: [name] MRN: [id number] DOB: 09-14-1928 Today's Date: 11/10/2016 Time: 2130-8657 SLP Time Calculation (min) (ACUTE ONLY): 15 min  Problem List:  Patient Active Problem List   Diagnosis Date Noted  . SDH (subdural hematoma) ([place]) 11/07/2016  . Subdural hematoma ([place]) 11/07/2016  . Chronic diastolic CHF (congestive heart failure) ([place]) 07/03/2016  . Gallstone pancreatitis 07/03/2016  . Hypertensive heart disease   . Syncope 12/11/2015  . Orthostatic hypotension   . Weakness 12/09/2015  . PAF (paroxysmal atrial fibrillation) ([place])   . CAD (coronary artery disease)   . Pressure ulcer 10/19/2015  . Long term (current) use of anticoagulants [Z79.01] 10/17/2015  . S/P minimally invasive mitral valve repair 10/04/2015  . Physical deconditioning   . Hemangioma of liver 08/02/2015  . Diabetes mellitus, type II ([place])   . Coronary artery disease   . Pulmonary HTN   . Lower extremity edema   . History of PSVT (paroxysmal supraventricular tachycardia) 11/15/2014  . Dyslipidemia 11/15/2014  . History of prostate cancer 11/15/2014  . Dry eye 09/03/2013  . TBI (traumatic brain injury) ([place]) 11/25/2012  . Orbit fracture ([place]) 11/25/2012  . Maxillary sinus fracture ([place]) 11/25/2012  . Frequent falls 11/25/2012  . OA (osteoarthritis)   . Metabolic syndrome   . ED (erectile dysfunction)   . Gout   . Spinal stenosis   . Hypertension 05/07/2012   Past Medical History:  Past Medical History:  Diagnosis Date  . Atelectasis of right lung   . Chronic diastolic (congestive) heart failure    a. 10/2013 Echo: EF 55-60%, basal-mid anteroseptal and basal-mid inferoseptal HK.  . Chronic fatigue   . Chronic subdural hematoma ([place])    a. 12/2015 Head CT: chronic right holo-hemispheric SDH w/o midline shift.  . Coronary artery disease    a. 11/2014 NSTEMI: DESx 2 placed to LAD and RCA; b. 08/2015 Cath: LAD patent stent, LCX 66m, RCA  patent stent, 70m.  . Diabetes mellitus, type II ([place])    a. HgA1c 6.8 in 03/2015  . ED (erectile dysfunction)   . Gout   . Hemangioma of liver 08/02/2015   12 mm enhancing lesion seen on CT - suspicious for benign hemangioma but not diagnostic - MRI recommended  . HLD (hyperlipidemia)   . Hypertensive heart disease   . Kidney stones   . Lower extremity edema    a. chronic  . OA (osteoarthritis)   . Orthostatic hypotension   . PAF (paroxysmal atrial fibrillation) ([place])    a. 10/2015 s/p DCCV;  b. CHA2DS2VASc = 6--> on coumadin; c. 10/2015 Echo: sev dil LA.  Marland Kitchen Physical deconditioning   . Prostate cancer ([place])   . Pulmonary HTN   . Pulmonary hypertension    a. 10/2015 Echo: PASP 71mmHg.  Marland Kitchen Severe mitral regurgitation    a.  10/2015 s/p minimally invasive MV repair; b. 10/2015 Echo: mod MS, mean grad 39mmHg.  Marland Kitchen Spinal stenosis   . SVT (supraventricular tachycardia) ([facility])    a. recurrent, usually responsive to vagal manuevers  . Tricuspid regurgitation    a. 10/2015 Echo: mild to mod TR.   Past Surgical History:  Past Surgical History:  Procedure Laterality Date  . BACK SURGERY    . Back surgery x 2    . CARDIAC CATHETERIZATION N/A 08/08/2015   Procedure: Right/Left Heart Cath and Coronary Angiography;  Surgeon: Sherren Mocha, MD;  Location: [facility] CV LAB;  Service: Cardiovascular;  Laterality: N/A;  .  CARDIAC SURGERY     2 cardiac stents  . CARDIOVERSION N/A 10/20/2015   Procedure: CARDIOVERSION;  Surgeon: Pixie Casino, MD;  Location: [facility];  Service: Cardiovascular;  Laterality: N/A;  . CERVICAL SPINE SURGERY    . CHOLECYSTECTOMY N/A 07/08/2016   Procedure: LAPAROSCOPIC CHOLECYSTECTOMY WITH INTRAOPERATIVE CHOLANGIOGRAM;  Surgeon: Georganna Skeans, MD;  Location: [facility];  Service: General;  Laterality: N/A;  . EYE SURGERY    . INTRAOPERATIVE TRANSESOPHAGEAL ECHOCARDIOGRAM  10/05/2015   Procedure: INTRAOPERATIVE TRANSESOPHAGEAL ECHOCARDIOGRAM;  Surgeon: Rexene Alberts, MD;   Location: [facility] OR;  Service: Open Heart Surgery;;  . JOINT REPLACEMENT     shoulder  . LEFT HEART CATHETERIZATION WITH CORONARY ANGIOGRAM N/A 11/16/2014   Procedure: LEFT HEART CATHETERIZATION WITH CORONARY ANGIOGRAM;  Surgeon: Troy Sine, MD;  Location: [facility] CATH LAB;  Service: Cardiovascular;  Laterality: N/A;  . MITRAL VALVE REPAIR Right 10/04/2015   Procedure: MINIMALLY INVASIVE MITRAL VALVE REPAIR (MVR);  Surgeon: Rexene Alberts, MD;  Location: [facility];  Service: Open Heart Surgery;  Laterality: Right;  . Rt rotator cuff repair    . TEE WITHOUT CARDIOVERSION N/A 06/22/2015   Procedure: TRANSESOPHAGEAL ECHOCARDIOGRAM (TEE);  Surgeon: Skeet Latch, MD;  Location: [facility];  Service: Cardiovascular;  Laterality: N/A;  . TEE WITHOUT CARDIOVERSION N/A 10/04/2015   Procedure: TRANSESOPHAGEAL ECHOCARDIOGRAM (TEE);  Surgeon: Rexene Alberts, MD;  Location: [facility];  Service: Open Heart Surgery;  Laterality: N/A;  . WOUND EXPLORATION N/A 10/05/2015   Procedure: Sternal Incision;  Surgeon: Rexene Alberts, MD;  Location: [facility];  Service: Open Heart Surgery;  Laterality: N/A;   HPI:  [name] was walking onto his porch earlier today when he fell, striking the right side of his face. He remembers some of the event. His familystates that he was speaking in a confused fashion after he fell. No LOC. He was taken to [facility] emergency department for further evaluation. He was found to have a small right subdural hematoma and a left subarachnoid hemorrhage. He is anticoagulated on Coumadin for atrial fibrillation. He was also found to have right 5th rib fracture. Dr. Vertell Limber from neurosurgery was consulted. CT head is showing a small subdural hematoma in the right parietal lobe and a subarachnoid hemorrhage in the left sylvian fissure over the left frontal convexity.  He had previous ST work up for swallowing in [employer] 2017 as well as March 2017.   Assessment / Plan / Recommendation Clinical Impression   Cognitive/linguistic and motor speech evaluation was completed.  The patient was noted to speak rapidly with a low vocal volume leading to decreased intelligiblity.  He currently has a rib fracture and does not appear to take in large breathes of air which may be impacting his vocal volume.  He achieved a score of 17/23 (i.e. the attention, writing and copying tasks were not administered) on the [place] Exam.  He was oriented to person, place and situation.  He was partially disoriented to time.  He knew the month, year and day of week but not the date.  Immediate recall of 3 novel words was good but given a short delay the patient was only able to recall 1/3.  Semantic cue facilitated recall of 1 more novel word.  He had difficulty following a 3 step command and repeating a short phrase.  Problem solving appeared to be functional and the patient was able to name objects and read/understand a short phrase.  Question the patient's judgement.  He is aware of his current issues but he feels that it would be easy to go home and take care of himself.  Recommend follow up ST to address possible dysarthria and cognitive deficits.  Patient may require follow up at the next level of care.      SLP Assessment  SLP Recommendation/Assessment: Patient needs continued Speech Lanaguage Pathology Services SLP Visit Diagnosis: Cognitive communication deficit (R41.841)    Follow Up Recommendations  Other (comment) (follow up ST at DC.)    Frequency and Duration min 2x/week  2 weeks      SLP Evaluation Cognition  Overall Cognitive Status: Impaired/Different from baseline Arousal/Alertness: Awake/alert Orientation Level: Oriented to person;Oriented to place;Oriented to situation;Disoriented to time (Pt knew month, day of week and year but not the date.  ) Attention: Sustained Sustained Attention: Impaired Sustained Attention Impairment: Verbal basic Memory: Impaired Memory Impairment: Decreased recall of  new information Awareness: Appears intact Problem Solving: Appears intact Safety/Judgment: Impaired Comments: Pt feels it would be easy to go home and take care of himself.         Comprehension  Auditory Comprehension Overall Auditory Comprehension: Impaired Commands: Impaired Multistep Basic Commands: 75-100% accurate Conversation: Simple Reading Comprehension Reading Status: Within funtional limits    Expression Expression Primary Mode of Expression: Verbal Verbal Expression Overall Verbal Expression: Appears within functional limits for tasks assessed Initiation: No impairment Automatic Speech: Name;Social Response Level of Generative/Spontaneous Verbalization: Sentence Naming: No impairment Pragmatics: No impairment Non-Verbal Means of Communication: Not applicable Written Expression Dominant Hand: Right Written Expression: Not tested   Oral / Motor  Oral Motor/Sensory Function Overall Oral Motor/Sensory Function: Within functional limits Motor Speech Overall Motor Speech: Impaired Respiration: Within functional limits Phonation: Normal Resonance: Within functional limits Articulation: Impaired Level of Impairment: Phrase Intelligibility: Intelligibility reduced Word: 50-74% accurate Phrase: 50-74% accurate Sentence: 50-74% accurate Conversation: 50-74% accurate Motor Planning: Witnin functional limits Motor Speech Errors: Not applicable   GO                   Shelly Flatten, MA, CCC-SLP Acute Rehab SLP [phone] Lamar Sprinkles 11/10/2016, 12:07 PM

## 2016-11-11 MED ORDER — TRAMADOL HCL 50 MG PO TABS: 50.0000 mg | ORAL_TABLET | Freq: Four times a day (QID) | ORAL | 0 refills | Status: DC | PRN

## 2016-11-11 NOTE — Care Management Note (Signed)
Case Management Note  Patient Details  Name: [name] MRN: [id number] Date of Birth: July 02, 1929  Subjective/Objective:   Pt admitted on 11/07/16 s/p fall while on coumadin, sustaining Rt SDH and SAH.  PTA, pt resided at home with spouse.                  Action/Plan: PT/OT recommending HH follow up at discharge.  Case manager will follow for home needs as pt progresses.  MD: please leave order for [facility] if you agree with assessment.     Expected Discharge Date:  11/11/16               Expected Discharge Plan:  [place]  In-House Referral:     Discharge planning Services  CM Consult  Post Acute Care Choice:  NA Choice offered to:  Patient  DME Arranged:    DME Agency:     HH Arranged:  PT, OT, Speech Therapy [place] Agency:  [facility]  Status of Service:  Completed, signed off  If discussed at [place] of Stay Meetings, dates discussed:    Additional Comments:  11/11/16 J. Shatonya Passon, RN, BSN  Pt medically stable for discharge home today with spouse.  Met with pt and wife to finalize dc arrangements.  PT/OT/ST recommending home therapies; referral to [facility], per pt/wife choice.  Start of care 24-48h post dc date.  Pt /wife deny any DME needs for home.    Reinaldo Raddle, RN, BSN  Trauma/Neuro ICU Case Manager [phone]

## 2016-11-11 NOTE — Discharge Summary (Signed)
[facility] Discharge Summary   Patient ID: [name] MRN: [id number] DOB/AGE: [birth year] 81 y.o.  Admit date: 11/07/2016 Discharge date: 11/11/2016  Admitting Diagnosis: Left SAH Right SDH Facial laceration Syncope and collapse Abrasion right cornea Subconjunctival bleed, right Right 5th rib fracture  Discharge Diagnosis Patient Active Problem List   Diagnosis Date Noted  . SDH (subdural hematoma) ([place]) 11/07/2016  . Subdural hematoma ([place]) 11/07/2016  . Chronic diastolic CHF (congestive heart failure) ([place]) 07/03/2016  . Gallstone pancreatitis 07/03/2016  . Hypertensive heart disease   . Syncope 12/11/2015  . Orthostatic hypotension   . Weakness 12/09/2015  . PAF (paroxysmal atrial fibrillation) ([place])   . CAD (coronary artery disease)   . Pressure ulcer 10/19/2015  . Long term (current) use of anticoagulants [Z79.01] 10/17/2015  . S/P minimally invasive mitral valve repair 10/04/2015  . Physical deconditioning   . Hemangioma of liver 08/02/2015  . Diabetes mellitus, type II ([place])   . Coronary artery disease   . Pulmonary HTN   . Lower extremity edema   . History of PSVT (paroxysmal supraventricular tachycardia) 11/15/2014  . Dyslipidemia 11/15/2014  . History of prostate cancer 11/15/2014  . Dry eye 09/03/2013  . TBI (traumatic brain injury) ([place]) 11/25/2012  . Orbit fracture ([place]) 11/25/2012  . Maxillary sinus fracture ([place]) 11/25/2012  . Frequent falls 11/25/2012  . OA (osteoarthritis)   . Metabolic syndrome   . ED (erectile dysfunction)   . Gout   . Spinal stenosis   . Hypertension 05/07/2012    Consultants Erline Levine MD - neurosurgery  Imaging: Dg Chest Port 1 View  Result Date: 11/10/2016 CLINICAL DATA:  Fall Monday. EXAM: PORTABLE CHEST 1 VIEW COMPARISON:  November 08, 2016 FINDINGS: Persistent right effusion and underlying opacity. Stable cardiomegaly. Right-sided rib fractures. No pneumothorax. No other change. IMPRESSION:  Persistent right-sided rib fractures with a right-sided effusion and underlying opacity. No pneumothorax. Electronically Signed   By: Dorise Bullion III M.D   On: 11/10/2016 14:07   CT head, maxillofacial, and cervical spine wo contrast 11/07/16: Small volume of subdural hemorrhage on the right superimposed on a subdural hygroma. Mass effect on the right cerebral convexities is unchanged compared the prior exam. Small volume of subarachnoid hemorrhage on the left. Soft tissue contusion about the right eye.  Negative for fracture. No acute abnormality cervical spine. Atrophy and chronic microvascular ischemic change. Atherosclerosis. Multilevel cervical spondylosis.  CT head wo contrast 11/08/16: 1. New small focus of hemorrhage within the right quadrigeminal plate cistern. Additional short interval follow-up recommended to ensure stability. 2. Stable low-attenuation right convexity subdural hygroma. 3. Redistribution of subdural hemorrhage along the falx to the right parietal convexity and tentorium cerebelli with grossly stable volume. 4. Stable small volume of subarachnoid hemorrhage over left lateral frontal lobes and left sylvian fissure. 5. Moderate paranasal sinus disease. These results will be called to the ordering clinician or representative by the Radiologist Assistant, and communication documented in the PACS or zVision Dashboard.  CT head wo contrast 11/09/16: 1. No evidence for new large territory infarct, new intracranial hemorrhage, or significant mass effect. 2. Interval partial dispersion of subarachnoid hemorrhage in left sylvian fissure in over left frontal convexity. 3. Stable subdural hemorrhage over the right parietal lobe and right tentorium cerebelli. 4. Stable small focus of hemorrhage within right quadrigeminal plate cistern. 5. Stable right convexity hygroma. 6. Stable moderate paranasal sinus disease.  DG chest port 1 view 11/10/16: Persistent right-sided rib  fractures with a right-sided effusion  and underlying opacity. No pneumothorax.  Procedures None  Hospital Course:  [name] is an 83yo male who was brought to [facility] 11/07/16 as transfer from [facility] after sustaining a ground level fall at home.  Workup showed a small right subdural hematoma and a left subarachnoid hemorrhage, as well as a right 5th rib fracture. He was anticoagulated on coumadin for atrial fibrillation; this was revered with [place] and vitamin K.  Head laceration was sutured in the ED. Patient was admitted to the ICU for observation.  Neurosurgery consulted for head injuries and recommended nonoperative management. Repeat head CT the following day was stable. Patient remained stable and was transferred to the floor.  Diet was advanced as tolerated.  He worked with therapies during this admission. On 4/9 the patient was voiding well, tolerating diet, ambulating well, pain well controlled, vital signs stable and felt stable for discharge home.  Patient will follow up with neurosurgery in 2 weeks, and with PCP in about 1 week for suture removal. Patient and knows to call with questions or concerns.    I have personally reviewed the patients medication history on the [place] controlled substance database.   Physical Exam: General:  Alert, NAD, comfortable HEENT: right racoon eye and scleral hemorrhage Pulm: effort normal, CTAB Abd:  Soft, ND, NT, no HSM or hernia Ext: BUE/BLE motor and sensory exam stable and equal bilaterally   Allergies as of 11/11/2016   No Known Allergies     Medication List    STOP taking these medications   aspirin EC 81 MG tablet   warfarin 2 MG tablet Commonly known as:  COUMADIN     TAKE these medications   allopurinol 100 MG tablet Commonly known as:  ZYLOPRIM Take 1 tablet (100 mg total) by mouth daily.   BENEFIBER Powd Take by mouth See admin instructions. Mix one teaspoonful into 6-8 ounces of fluid/milk and drink once daily    colchicine 0.6 MG tablet Take 1 tablet (0.6 mg total) by mouth daily.   finasteride 5 MG tablet Commonly known as:  PROSCAR TAKE ONE (1) TABLET EACH DAY   furosemide 40 MG tablet Commonly known as:  LASIX Take 1 tablet (40 mg total) by mouth 2 (two) times daily.   magnesium oxide 400 MG tablet Commonly known as:  MAG-OX Take 1 tablet (400 mg total) by mouth daily.   metoprolol tartrate 25 MG tablet Commonly known as:  LOPRESSOR Take 0.5 tablets (12.5 mg total) by mouth 2 (two) times daily.   OVER THE COUNTER MEDICATION Neuroquell: Take 1 capsule by mouth once a day for immune health   potassium chloride 20 MEQ packet Commonly known as:  KLOR-CON Take 20 mEq by mouth daily.   pravastatin 40 MG tablet Commonly known as:  PRAVACHOL Take 1 tablet (40 mg total) by mouth daily at 6 PM.   predniSONE 20 MG tablet Commonly known as:  DELTASONE 3 tabs poqday 1-2, 2 tabs poqday 3-4, 1 tab poqday 5-6   traMADol 50 MG tablet Commonly known as:  ULTRAM Take 1 tablet (50 mg total) by mouth every 6 (six) hours as needed for moderate pain.        Follow-up Information    STERN,JOSEPH D, MD. Schedule an appointment as soon as possible for a visit in 4 week(s).   Specialty:  Neurosurgery Why:  for follow-up from your head injury Contact information: [address] [place] [address] [phone]  [facility]. Call.   Why:  as needed Contact information: [place] [address] [place], MD. Call in 1 week(s).   Specialty:  Family Medicine Why:  for removal of forehead sutures, and follow-up for multiple medical problems Contact information: [address] [place] [address] [phone]           Signed: Jerrye Beavers, [facility] Surgery 11/11/2016, 11:34 AM Pager: [phone] Consults: [phone] Mon-Fri 7:00 am-4:30  pm Sat-Sun 7:00 am-11:30 am

## 2016-11-11 NOTE — Progress Notes (Signed)
Patient ready for discharge to home; discharge instructions given and reviewed; Rx given. Patient assisted to dress; assisted to wheelchair; discharged out with volunteers. Accompanied home by his wife.

## 2016-11-11 NOTE — Progress Notes (Signed)
PT Cancellation Note  Patient Details Name: [name] MRN: [id number] DOB: 08-26-1928   Cancelled Treatment:    Reason Eval/Treat Not Completed: Other (comment) Per RN, pt just d/c home. Per RN HHPT already set up.    Nicky Pugh, PT, DPT  Acute Rehabilitation Services  Pager: [phone]    Army Melia 11/11/2016, 2:53 PM

## 2016-11-11 NOTE — Care Management Important Message (Signed)
Important Message  Patient Details  Name: [name] MRN: [id number] Date of Birth: 03-22-1929   Medicare Important Message Given:  Yes    Orbie Pyo 11/11/2016, 2:21 PM

## 2016-11-11 NOTE — Progress Notes (Signed)
Occupational Therapy Treatment Patient Details Name: [name] MRN: [id number] DOB: [birth year] Today's Date: 11/11/2016    History of present illness Patient is a 83 y/o male who presents s/p fall at home. PMH includes CAD, MVR, A-fib, DM, SDH, HF. Head CT-Small volume of subdural hemorrhage on the right superimposed on a chronic subdural hygroma. Head CT 4/6-New small focus of hemorrhage within the right quadrigeminal plate cistern. CXR-right fifth rib fxx.   OT comments  Pt progressing toward OT goals; however, was limited this session due to pain with coughing at his R rib fracture. Pt able to complete toilet transfers and grooming tasks with min assist and maximum encouragement this session. Opt continues to require mod assist for bed mobility. Discussed current assist levels with pt and wife who reports that she will be able to provide the necessary assistance at home. Feel pt may benefit from short-term SNF placement for continued rehabilitation prior to return home but he is not agreeable to this and will need home health OT services at D/C. Will continue to follow acutely.   Follow Up Recommendations  Home health OT;Supervision/Assistance - 24 hour    Equipment Recommendations  None recommended by OT    Recommendations for Other Services      Precautions / Restrictions Precautions Precautions: Fall Restrictions Weight Bearing Restrictions: No       Mobility Bed Mobility Overal bed mobility: Needs Assistance Bed Mobility: Rolling;Sidelying to Sit Rolling: Mod assist Sidelying to sit: Mod assist;HOB elevated       General bed mobility comments: Pt frustrated with VC's for log roll technique. Required mod assist to scoot to EOB and for rolling. Pt initially reporting "I can do this myself" and then requesting help.  Transfers Overall transfer level: Needs assistance Equipment used: None Transfers: Sit to/from [facility] Sit to Stand: Min  assist Stand pivot transfers: Min assist       General transfer comment: Min assist to steady in standing position.    Balance Overall balance assessment: Needs assistance Sitting-balance support: Feet supported;Single extremity supported Sitting balance-Leahy Scale: Fair Sitting balance - Comments: Guarded sitting EOB.    Standing balance support: During functional activity;Single extremity supported Standing balance-Leahy Scale: Fair                             ADL either performed or assessed with clinical judgement   ADL Overall ADL's : Needs assistance/impaired     Grooming: Minimal assistance;Bed level Grooming Details (indicate cue type and reason): Able to wash face with maximum verbal encouragement. Reporting initially "I can't do that" but was able to complete task with min assist.         Upper Body Dressing : Minimal assistance;Sitting Upper Body Dressing Details (indicate cue type and reason): Pt found to have urinated in bed and gown was wet. Min assist to change gown.     Toilet Transfer: Minimal assistance;Ambulation;BSC;RW Toilet Transfer Details (indicate cue type and reason): Min assist for mobility. Increased time due to pain with coughing fit.         Functional mobility during ADLs: Minimal assistance;Rolling walker General ADL Comments: Improved R hand edema and pt reporting improvement in R shoulder pain. Greatest pain in R rib fracture this session. Pt not agreeable to R shoulder or hand exercises.     Vision   Additional Comments: Reports no blurry vision.    Perception     Praxis  Cognition Arousal/Alertness: Awake/alert Behavior During Therapy: WFL for tasks assessed/performed Overall Cognitive Status: Impaired/Different from baseline Area of Impairment: Memory;Following commands;Problem solving                     Memory: Decreased short-term memory Following Commands: Follows one step commands with  increased time     Problem Solving: Slow processing General Comments: Pt becoming agitated at times with instruction. Significant pain in ribs with coughing frustrating to pt.        Exercises     Shoulder Instructions       General Comments      Pertinent Vitals/ Pain       Pain Assessment: 0-10 Pain Score: 4  Pain Location: rib area on right side Pain Descriptors / Indicators: Stabbing Pain Intervention(s): Limited activity within patient's tolerance;Monitored during session;Repositioned  Home Living                                          Prior Functioning/Environment              Frequency  Min 2X/week        Progress Toward Goals  OT Goals(current goals can now be found in the care plan section)  Progress towards OT goals: Progressing toward goals  Acute Rehab OT Goals Patient Stated Goal: to make this pain better OT Goal Formulation: With patient Time For Goal Achievement: 11/22/16 Potential to Achieve Goals: Good ADL Goals Pt Will Perform Grooming: with supervision;standing Pt Will Perform Upper Body Bathing: with supervision;sitting Pt Will Perform Lower Body Bathing: with supervision;sit to/from stand Pt Will Transfer to Toilet: with supervision;ambulating;bedside commode (over toilet) Pt Will Perform Toileting - Clothing Manipulation and hygiene: with supervision;sit to/from stand  Plan Discharge plan remains appropriate    Co-evaluation                 End of Session Equipment Utilized During Treatment: Gait belt;Rolling walker  OT Visit Diagnosis: Unsteadiness on feet (R26.81);Repeated falls (R29.6);Pain Pain - Right/Left: Right Pain - part of body: Shoulder;Hand   Activity Tolerance Patient limited by pain   Patient Left in chair;with call bell/phone within reach;with chair alarm set   Nurse Communication Mobility status        Time: 1014-1100 OT Time Calculation (min): 46 min  Charges: OT General  Charges $OT Visit: 1 Procedure OT Treatments $Self Care/Home Management : 38-52 mins  Norman Herrlich, MS OTR/L  Pager: [name] 11/11/2016, 1:18 PM

## 2016-11-12 ENCOUNTER — Other Ambulatory Visit: Payer: Self-pay | Admitting: Family Medicine

## 2016-11-12 DIAGNOSIS — S065X0S Traumatic subdural hemorrhage without loss of consciousness, sequela: Secondary | ICD-10-CM | POA: Diagnosis not present

## 2016-11-12 DIAGNOSIS — R471 Dysarthria and anarthria: Secondary | ICD-10-CM | POA: Diagnosis not present

## 2016-11-12 DIAGNOSIS — I2510 Atherosclerotic heart disease of native coronary artery without angina pectoris: Secondary | ICD-10-CM | POA: Diagnosis not present

## 2016-11-12 DIAGNOSIS — Z7901 Long term (current) use of anticoagulants: Secondary | ICD-10-CM | POA: Diagnosis not present

## 2016-11-12 DIAGNOSIS — E785 Hyperlipidemia, unspecified: Secondary | ICD-10-CM | POA: Diagnosis not present

## 2016-11-12 DIAGNOSIS — M6281 Muscle weakness (generalized): Secondary | ICD-10-CM | POA: Diagnosis not present

## 2016-11-12 DIAGNOSIS — I272 Pulmonary hypertension, unspecified: Secondary | ICD-10-CM | POA: Diagnosis not present

## 2016-11-12 DIAGNOSIS — E119 Type 2 diabetes mellitus without complications: Secondary | ICD-10-CM | POA: Diagnosis not present

## 2016-11-12 DIAGNOSIS — I5032 Chronic diastolic (congestive) heart failure: Secondary | ICD-10-CM | POA: Diagnosis not present

## 2016-11-12 DIAGNOSIS — I110 Hypertensive heart disease with heart failure: Secondary | ICD-10-CM | POA: Diagnosis not present

## 2016-11-12 DIAGNOSIS — I6203 Nontraumatic chronic subdural hemorrhage: Secondary | ICD-10-CM | POA: Diagnosis not present

## 2016-11-12 DIAGNOSIS — Z794 Long term (current) use of insulin: Secondary | ICD-10-CM | POA: Diagnosis not present

## 2016-11-12 DIAGNOSIS — I480 Paroxysmal atrial fibrillation: Secondary | ICD-10-CM | POA: Diagnosis not present

## 2016-11-12 DIAGNOSIS — I340 Nonrheumatic mitral (valve) insufficiency: Secondary | ICD-10-CM | POA: Diagnosis not present

## 2016-11-12 DIAGNOSIS — S2231XD Fracture of one rib, right side, subsequent encounter for fracture with routine healing: Secondary | ICD-10-CM | POA: Diagnosis not present

## 2016-11-12 MED ORDER — FUROSEMIDE 40 MG PO TABS: 40.0000 mg | ORAL_TABLET | Freq: Two times a day (BID) | ORAL | 3 refills | Status: DC

## 2016-11-12 NOTE — Telephone Encounter (Signed)
Medication refilled per protocol. 

## 2016-11-12 NOTE — Telephone Encounter (Signed)
Pt needs refill on lasix

## 2016-11-15 ENCOUNTER — Ambulatory Visit (INDEPENDENT_AMBULATORY_CARE_PROVIDER_SITE_OTHER): Payer: Medicare Other | Admitting: Family Medicine

## 2016-11-15 ENCOUNTER — Encounter: Payer: Self-pay | Admitting: Family Medicine

## 2016-11-15 ENCOUNTER — Ambulatory Visit (HOSPITAL_COMMUNITY)
Admission: RE | Admit: 2016-11-15 | Discharge: 2016-11-15 | Disposition: A | Discharge status: Home / Self Care | Payer: Medicare Other | Source: Ambulatory Visit | Attending: Family Medicine | Admitting: Family Medicine

## 2016-11-15 VITALS — BP 116/60 | HR 62 | Temp 98.1°F | Resp 16 | Ht 67.0 in

## 2016-11-15 DIAGNOSIS — I609 Nontraumatic subarachnoid hemorrhage, unspecified: Secondary | ICD-10-CM | POA: Diagnosis not present

## 2016-11-15 DIAGNOSIS — Z09 Encounter for follow-up examination after completed treatment for conditions other than malignant neoplasm: Secondary | ICD-10-CM | POA: Diagnosis not present

## 2016-11-15 DIAGNOSIS — I480 Paroxysmal atrial fibrillation: Secondary | ICD-10-CM

## 2016-11-15 DIAGNOSIS — M25531 Pain in right wrist: Secondary | ICD-10-CM | POA: Diagnosis not present

## 2016-11-15 NOTE — Progress Notes (Signed)
Subjective:    Patient ID: [name], male    DOB: 1929-01-17, 83 y.o.   MRN: [id number]  HPI The patient slipped and fell striking the right side of his head on April 5. Unfortunately he suffered a subarachnoid hemorrhage and was admitted to the hospital. I have copied relevant portions of the discharge summary below: Admit date: 11/07/2016 Discharge date: 11/11/2016  Admitting Diagnosis: Left SAH Right SDH Facial laceration Syncope and collapse Abrasion right cornea Subconjunctival bleed, right Right 5th rib fracture  Discharge Diagnosis     Patient Active Problem List   Diagnosis Date Noted  . SDH (subdural hematoma) ([place]) 11/07/2016  . Subdural hematoma ([place]) 11/07/2016  . Chronic diastolic CHF (congestive heart failure) ([place]) 07/03/2016  . Gallstone pancreatitis 07/03/2016  . Hypertensive heart disease   . Syncope 12/11/2015  . Orthostatic hypotension   . Weakness 12/09/2015  . PAF (paroxysmal atrial fibrillation) ([place])   . CAD (coronary artery disease)   . Pressure ulcer 10/19/2015  . Long term (current) use of anticoagulants [Z79.01] 10/17/2015  . S/P minimally invasive mitral valve repair 10/04/2015  . Physical deconditioning   . Hemangioma of liver 08/02/2015  . Diabetes mellitus, type II ([place])   . Coronary artery disease   . Pulmonary HTN   . Lower extremity edema   . History of PSVT (paroxysmal supraventricular tachycardia) 11/15/2014  . Dyslipidemia 11/15/2014  . History of prostate cancer 11/15/2014  . Dry eye 09/03/2013  . TBI (traumatic brain injury) ([place]) 11/25/2012  . Orbit fracture ([place]) 11/25/2012  . Maxillary sinus fracture ([place]) 11/25/2012  . Frequent falls 11/25/2012  . OA (osteoarthritis)   . Metabolic syndrome   . ED (erectile dysfunction)   . Gout   . Spinal stenosis   . Hypertension 05/07/2012    Consultants Erline Levine MD - neurosurgery  Imaging:  Imaging Results (Last 48 hours)  Dg Chest Port 1  View  Result Date: 11/10/2016 CLINICAL DATA:  Fall Monday. EXAM: PORTABLE CHEST 1 VIEW COMPARISON:  November 08, 2016 FINDINGS: Persistent right effusion and underlying opacity. Stable cardiomegaly. Right-sided rib fractures. No pneumothorax. No other change. IMPRESSION: Persistent right-sided rib fractures with a right-sided effusion and underlying opacity. No pneumothorax. Electronically Signed   By: Dorise Bullion III M.D   On: 11/10/2016 14:07    CT head, maxillofacial, and cervical spine wo contrast 11/07/16: Small volume of subdural hemorrhage on the right superimposed on a subdural hygroma. Mass effect on the right cerebral convexities is unchanged compared the prior exam. Small volume of subarachnoid hemorrhage on the left. Soft tissue contusion about the right eye. Negative for fracture. No acute abnormality cervical spine. Atrophy and chronic microvascular ischemic change. Atherosclerosis. Multilevel cervical spondylosis.  CT head wo contrast 11/08/16: 1. New small focus of hemorrhage within the right quadrigeminal plate cistern. Additional short interval follow-up recommended to ensure stability. 2. Stable low-attenuation right convexity subdural hygroma. 3. Redistribution of subdural hemorrhage along the falx to the right parietal convexity and tentorium cerebelli with grossly stable volume. 4. Stable small volume of subarachnoid hemorrhage over left lateral frontal lobes and left sylvian fissure. 5. Moderate paranasal sinus disease. These results will be called to the ordering clinician or representative by the Radiologist Assistant, and communication documented in the PACS or zVision Dashboard.  CT head wo contrast 11/09/16: 1. No evidence for new large territory infarct, new intracranial hemorrhage, or significant mass effect. 2. Interval partial dispersion of subarachnoid hemorrhage in left sylvian fissure in over left  frontal convexity. 3. Stable subdural hemorrhage over the  right parietal lobe and right tentorium cerebelli. 4. Stable small focus of hemorrhage within right quadrigeminal plate cistern. 5. Stable right convexity hygroma. 6. Stable moderate paranasal sinus disease.  DG chest port 1 view 11/10/16: Persistent right-sided rib fractures with a right-sided effusion and underlying opacity. No pneumothorax.  Procedures None  Hospital Course:  [name] is an 83yo male who was brought to [facility] 11/07/16 as transfer from [facility] after sustaining a ground level fall at home.  Workup showed a small right subdural hematoma and a left subarachnoid hemorrhage, as well as a right 5th rib fracture. He was anticoagulated on coumadin for atrial fibrillation; this was revered with [place] and vitamin K.  Head laceration was sutured in the ED. Patient was admitted to the ICU for observation.  Neurosurgery consulted for head injuries and recommended nonoperative management. Repeat head CT the following day was stable. Patient remained stable and was transferred to the floor.  Diet was advanced as tolerated.  He worked with therapies during this admission. On 4/9 the patient was voiding well, tolerating diet, ambulating well, pain well controlled, vital signs stable and felt stable for discharge home.  Patient will follow up with neurosurgery in 2 weeks, and with PCP in about 1 week for suture removal. Patient and knows to call with questions or concerns.    I have personally reviewed the patients medication history on the [place] controlled substance database.   He is here today for suture removal. There are approximately 7, 4-0 Prolene sutures closing the laceration above his right eye. These are removed without difficulty. The wound is well-healed. He denies any headache, neurologic deficits, dizziness, nausea or vomiting. However he does complain of pain in his right wrist. He injured his right wrist when he fell. Due to the complexity of his presentation, no x-rays were  obtained of his right wrist during his hospitalization. He is tender to palpation of the distal radius. There is ecchymosis and bruising there and swelling  Past Medical History:  Diagnosis Date  . Atelectasis of right lung   . Chronic diastolic (congestive) heart failure    a. 10/2013 Echo: EF 55-60%, basal-mid anteroseptal and basal-mid inferoseptal HK.  . Chronic fatigue   . Chronic subdural hematoma ([place])    a. 12/2015 Head CT: chronic right holo-hemispheric SDH w/o midline shift.  . Coronary artery disease    a. 11/2014 NSTEMI: DESx 2 placed to LAD and RCA; b. 08/2015 Cath: LAD patent stent, LCX 64m, RCA patent stent, 67m.  . Diabetes mellitus, type II ([place])    a. HgA1c 6.8 in 03/2015  . ED (erectile dysfunction)   . Gout   . Hemangioma of liver 08/02/2015   12 mm enhancing lesion seen on CT - suspicious for benign hemangioma but not diagnostic - MRI recommended  . HLD (hyperlipidemia)   . Hypertensive heart disease   . Kidney stones   . Lower extremity edema    a. chronic  . OA (osteoarthritis)   . Orthostatic hypotension   . PAF (paroxysmal atrial fibrillation) ([place])    a. 10/2015 s/p DCCV;  b. CHA2DS2VASc = 6--> on coumadin; c. 10/2015 Echo: sev dil LA.  Marland Kitchen Physical deconditioning   . Prostate cancer ([place])   . Pulmonary HTN   . Pulmonary hypertension    a. 10/2015 Echo: PASP 35mmHg.  Marland Kitchen Severe mitral regurgitation    a.  10/2015 s/p minimally invasive MV repair; b. 10/2015  Echo: mod MS, mean grad 61mmHg.  Marland Kitchen Spinal stenosis   . SVT (supraventricular tachycardia) ([facility])    a. recurrent, usually responsive to vagal manuevers  . Tricuspid regurgitation    a. 10/2015 Echo: mild to mod TR.   Past Surgical History:  Procedure Laterality Date  . BACK SURGERY    . Back surgery x 2    . CARDIAC CATHETERIZATION N/A 08/08/2015   Procedure: Right/Left Heart Cath and Coronary Angiography;  Surgeon: Sherren Mocha, MD;  Location: [place] [facility];  Service: Cardiovascular;  Laterality: N/A;    . CARDIAC SURGERY     2 cardiac stents  . CARDIOVERSION N/A 10/20/2015   Procedure: CARDIOVERSION;  Surgeon: Pixie Casino, MD;  Location: [place];  Service: Cardiovascular;  Laterality: N/A;  . CERVICAL SPINE SURGERY    . CHOLECYSTECTOMY N/A 07/08/2016   Procedure: LAPAROSCOPIC CHOLECYSTECTOMY WITH INTRAOPERATIVE CHOLANGIOGRAM;  Surgeon: Georganna Skeans, MD;  Location: [place];  Service: General;  Laterality: N/A;  . EYE SURGERY    . INTRAOPERATIVE TRANSESOPHAGEAL ECHOCARDIOGRAM  10/05/2015   Procedure: INTRAOPERATIVE TRANSESOPHAGEAL ECHOCARDIOGRAM;  Surgeon: Rexene Alberts, MD;  Location[facility];  Service: Open Heart Surgery;;  . JOINT REPLACEMENT     shoulder  . LEFT HEART CATHETERIZATION WITH CORONARY ANGIOGRAM N/A 11/16/2014   Procedure: LEFT HEART CATHETERIZATION WITH CORONARY ANGIOGRAM;  Surgeon: Troy Sine, MD;  Location: [facility] CATH LAB;  Service: Cardiovascular;  Laterality: N/A;  . MITRAL VALVE REPAIR Right 10/04/2015   Procedure: MINIMALLY INVASIVE MITRAL VALVE REPAIR (MVR);  Surgeon: Rexene Alberts, MD;  Location: [place];  Service: Open Heart Surgery;  Laterality: Right;  . Rt rotator cuff repair    . TEE WITHOUT CARDIOVERSION N/A 06/22/2015   Procedure: TRANSESOPHAGEAL ECHOCARDIOGRAM (TEE);  Surgeon: Skeet Latch, MD;  Location: [place];  Service: Cardiovascular;  Laterality: N/A;  . TEE WITHOUT CARDIOVERSION N/A 10/04/2015   Procedure: TRANSESOPHAGEAL ECHOCARDIOGRAM (TEE);  Surgeon: Rexene Alberts, MD;  Location: [place];  Service: Open Heart Surgery;  Laterality: N/A;  . WOUND EXPLORATION N/A 10/05/2015   Procedure: Sternal Incision;  Surgeon: Rexene Alberts, MD;  Location: [place];  Service: Open Heart Surgery;  Laterality: N/A;   Current Outpatient Prescriptions on File Prior to Visit  Medication Sig Dispense Refill  . allopurinol (ZYLOPRIM) 100 MG tablet Take 1 tablet (100 mg total) by mouth daily. 30 tablet 5  . colchicine 0.6 MG tablet Take 1 tablet (0.6 mg total) by  mouth daily. 30 tablet 5  . finasteride (PROSCAR) 5 MG tablet TAKE ONE (1) TABLET EACH DAY 30 tablet 11  . furosemide (LASIX) 40 MG tablet Take 1 tablet (40 mg total) by mouth 2 (two) times daily. 60 tablet 3  . magnesium oxide (MAG-OX) 400 MG tablet Take 1 tablet (400 mg total) by mouth daily. 30 tablet 0  . metoprolol tartrate (LOPRESSOR) 25 MG tablet Take 0.5 tablets (12.5 mg total) by mouth 2 (two) times daily. 60 tablet 3  . OVER THE COUNTER MEDICATION Neuroquell: Take 1 capsule by mouth once a day for immune health    . potassium chloride (KLOR-CON) 20 MEQ packet Take 20 mEq by mouth daily. 30 packet 0  . pravastatin (PRAVACHOL) 40 MG tablet Take 1 tablet (40 mg total) by mouth daily at 6 PM. 30 tablet 11  . predniSONE (DELTASONE) 20 MG tablet 3 tabs poqday 1-2, 2 tabs poqday 3-4, 1 tab poqday 5-6 12 tablet 0  . traMADol (ULTRAM) 50 MG tablet  Take 1 tablet (50 mg total) by mouth every 6 (six) hours as needed for moderate pain. 30 tablet 0  . Wheat Dextrin (BENEFIBER) POWD Take by mouth See admin instructions. Mix one teaspoonful into 6-8 ounces of fluid/milk and drink once daily     No current facility-administered medications on file prior to visit.    No Known Allergies Social History   Social History  . Marital status: Married    Spouse name: N/A  . Number of children: 4  . Years of education: N/A   Occupational History  .  Retired   Social History Main Topics  . Smoking status: Former Smoker    Types: Cigarettes, Pipe, Landscape architect  . Smokeless tobacco: Former Systems developer    Types: Chew     Comment: [place] YEARS AGO"  . Alcohol use No  . Drug use: No  . Sexual activity: Not on file   Other Topics Concern  . Not on file   Social History Narrative   ** Merged History Encounter **       Four adopted children.  Lives alone.     Review of Systems  All other systems reviewed and are negative.      Objective:   Physical Exam  Constitutional: He is oriented to  person, place, and time. He appears well-developed and well-nourished. No distress.  HENT:  Head: Normocephalic and atraumatic.  Right Ear: External ear normal.  Left Ear: External ear normal.  Nose: Nose normal.  Mouth/Throat: Oropharynx is clear and moist. No oropharyngeal exudate.  Eyes: Conjunctivae and EOM are normal. Pupils are equal, round, and reactive to light. Right eye exhibits no discharge. Left eye exhibits no discharge. No scleral icterus.  Neck: Normal range of motion. Neck supple. No JVD present. No tracheal deviation present. No thyromegaly present.  Cardiovascular: Normal rate and regular rhythm.   Murmur heard. Pulmonary/Chest: Effort normal and breath sounds normal. No stridor. No respiratory distress. He has no wheezes. He has no rales.  Abdominal: Soft. Bowel sounds are normal.  Musculoskeletal:       Right wrist: He exhibits decreased range of motion, tenderness, bony tenderness and swelling.  Lymphadenopathy:    He has no cervical adenopathy.  Neurological: He is alert and oriented to person, place, and time. He displays normal reflexes. No cranial nerve deficit. He exhibits normal muscle tone. Coordination normal.  Skin: He is not diaphoretic.  Vitals reviewed.         Assessment & Plan:  Right wrist pain - Plan: DG Wrist Complete Right Hardy disch follow up A fib  Sutures removed without difficulty. No further follow-up is necessary for that. He has no neurologic deficits, worsening headaches, nausea or vomiting. Therefore I do not see a role for repeat imaging of the brain. Patient was on Coumadin for atrial fibrillation with a chads score of 5.  He does have a history of a mitral valve repair.  Therefore his Coumadin was slowly for stroke prevention due to his atrial fibrillation. Neurosurgery recommended discontinuation of Coumadin due to repeated falls and now intracranial hemorrhage.  I agree with their opinion. Patient has proven to be unsteady on  his feet and I believe he is at high risk for another fall and head trauma. Therefore I will be in favor of keeping him off Coumadin indefinitely but placing the patient on full aspirin. However my biggest concern right now as I believe he may have fractured his right radius. Place the  patient to a thumb spica splint and will send him for an x-ray of the right wrist immediately. Further follow-up will be dictated based on the results of the x-ray

## 2016-11-18 DIAGNOSIS — R471 Dysarthria and anarthria: Secondary | ICD-10-CM | POA: Diagnosis not present

## 2016-11-18 DIAGNOSIS — S2231XD Fracture of one rib, right side, subsequent encounter for fracture with routine healing: Secondary | ICD-10-CM | POA: Diagnosis not present

## 2016-11-18 DIAGNOSIS — I340 Nonrheumatic mitral (valve) insufficiency: Secondary | ICD-10-CM | POA: Diagnosis not present

## 2016-11-18 DIAGNOSIS — I6203 Nontraumatic chronic subdural hemorrhage: Secondary | ICD-10-CM | POA: Diagnosis not present

## 2016-11-18 DIAGNOSIS — E785 Hyperlipidemia, unspecified: Secondary | ICD-10-CM | POA: Diagnosis not present

## 2016-11-18 DIAGNOSIS — I110 Hypertensive heart disease with heart failure: Secondary | ICD-10-CM | POA: Diagnosis not present

## 2016-11-18 DIAGNOSIS — I5032 Chronic diastolic (congestive) heart failure: Secondary | ICD-10-CM | POA: Diagnosis not present

## 2016-11-18 DIAGNOSIS — I2510 Atherosclerotic heart disease of native coronary artery without angina pectoris: Secondary | ICD-10-CM | POA: Diagnosis not present

## 2016-11-18 DIAGNOSIS — I272 Pulmonary hypertension, unspecified: Secondary | ICD-10-CM | POA: Diagnosis not present

## 2016-11-18 DIAGNOSIS — M6281 Muscle weakness (generalized): Secondary | ICD-10-CM | POA: Diagnosis not present

## 2016-11-18 DIAGNOSIS — Z794 Long term (current) use of insulin: Secondary | ICD-10-CM | POA: Diagnosis not present

## 2016-11-18 DIAGNOSIS — E119 Type 2 diabetes mellitus without complications: Secondary | ICD-10-CM | POA: Diagnosis not present

## 2016-11-18 DIAGNOSIS — S065X0S Traumatic subdural hemorrhage without loss of consciousness, sequela: Secondary | ICD-10-CM | POA: Diagnosis not present

## 2016-11-18 DIAGNOSIS — Z7901 Long term (current) use of anticoagulants: Secondary | ICD-10-CM | POA: Diagnosis not present

## 2016-11-18 DIAGNOSIS — I480 Paroxysmal atrial fibrillation: Secondary | ICD-10-CM | POA: Diagnosis not present

## 2016-11-20 DIAGNOSIS — S2231XD Fracture of one rib, right side, subsequent encounter for fracture with routine healing: Secondary | ICD-10-CM | POA: Diagnosis not present

## 2016-11-20 DIAGNOSIS — Z794 Long term (current) use of insulin: Secondary | ICD-10-CM | POA: Diagnosis not present

## 2016-11-20 DIAGNOSIS — Z7901 Long term (current) use of anticoagulants: Secondary | ICD-10-CM | POA: Diagnosis not present

## 2016-11-20 DIAGNOSIS — I340 Nonrheumatic mitral (valve) insufficiency: Secondary | ICD-10-CM | POA: Diagnosis not present

## 2016-11-20 DIAGNOSIS — I480 Paroxysmal atrial fibrillation: Secondary | ICD-10-CM | POA: Diagnosis not present

## 2016-11-20 DIAGNOSIS — M6281 Muscle weakness (generalized): Secondary | ICD-10-CM | POA: Diagnosis not present

## 2016-11-20 DIAGNOSIS — I6203 Nontraumatic chronic subdural hemorrhage: Secondary | ICD-10-CM | POA: Diagnosis not present

## 2016-11-20 DIAGNOSIS — I2510 Atherosclerotic heart disease of native coronary artery without angina pectoris: Secondary | ICD-10-CM | POA: Diagnosis not present

## 2016-11-20 DIAGNOSIS — I5032 Chronic diastolic (congestive) heart failure: Secondary | ICD-10-CM | POA: Diagnosis not present

## 2016-11-20 DIAGNOSIS — R471 Dysarthria and anarthria: Secondary | ICD-10-CM | POA: Diagnosis not present

## 2016-11-20 DIAGNOSIS — E119 Type 2 diabetes mellitus without complications: Secondary | ICD-10-CM | POA: Diagnosis not present

## 2016-11-20 DIAGNOSIS — I110 Hypertensive heart disease with heart failure: Secondary | ICD-10-CM | POA: Diagnosis not present

## 2016-11-20 DIAGNOSIS — E785 Hyperlipidemia, unspecified: Secondary | ICD-10-CM | POA: Diagnosis not present

## 2016-11-20 DIAGNOSIS — I272 Pulmonary hypertension, unspecified: Secondary | ICD-10-CM | POA: Diagnosis not present

## 2016-11-20 DIAGNOSIS — S065X0S Traumatic subdural hemorrhage without loss of consciousness, sequela: Secondary | ICD-10-CM | POA: Diagnosis not present

## 2016-11-21 ENCOUNTER — Ambulatory Visit (INDEPENDENT_AMBULATORY_CARE_PROVIDER_SITE_OTHER): Payer: Medicare Other | Admitting: Family Medicine

## 2016-11-21 ENCOUNTER — Encounter: Payer: Self-pay | Admitting: Family Medicine

## 2016-11-21 VITALS — BP 100/58 | HR 80 | Temp 98.2°F | Resp 18 | Ht 67.0 in | Wt 192.0 lb

## 2016-11-21 DIAGNOSIS — S52601D Unspecified fracture of lower end of right ulna, subsequent encounter for closed fracture with routine healing: Secondary | ICD-10-CM

## 2016-11-21 NOTE — Progress Notes (Signed)
Subjective:    Patient ID: [name], male    DOB: [birth year], 83 y.o.   MRN: [id number]  HPI The patient slipped and fell striking the right side of his head on April 5. Unfortunately he suffered a subarachnoid hemorrhage and was admitted to the hospital. I have copied relevant portions of the discharge summary below: Admit date: 11/07/2016 Discharge date: 11/11/2016  Admitting Diagnosis: Left SAH Right SDH Facial laceration Syncope and collapse Abrasion right cornea Subconjunctival bleed, right Right 5th rib fracture  Discharge Diagnosis     Patient Active Problem List   Diagnosis Date Noted  . SDH (subdural hematoma) ([place]) 11/07/2016  . Subdural hematoma ([place]) 11/07/2016  . Chronic diastolic CHF (congestive heart failure) ([place]) 07/03/2016  . Gallstone pancreatitis 07/03/2016  . Hypertensive heart disease   . Syncope 12/11/2015  . Orthostatic hypotension   . Weakness 12/09/2015  . PAF (paroxysmal atrial fibrillation) ([place])   . CAD (coronary artery disease)   . Pressure ulcer 10/19/2015  . Long term (current) use of anticoagulants [Z79.01] 10/17/2015  . S/P minimally invasive mitral valve repair 10/04/2015  . Physical deconditioning   . Hemangioma of liver 08/02/2015  . Diabetes mellitus, type II ([place])   . Coronary artery disease   . Pulmonary HTN   . Lower extremity edema   . History of PSVT (paroxysmal supraventricular tachycardia) 11/15/2014  . Dyslipidemia 11/15/2014  . History of prostate cancer 11/15/2014  . Dry eye 09/03/2013  . TBI (traumatic brain injury) ([place]) 11/25/2012  . Orbit fracture ([place]) 11/25/2012  . Maxillary sinus fracture ([place]) 11/25/2012  . Frequent falls 11/25/2012  . OA (osteoarthritis)   . Metabolic syndrome   . ED (erectile dysfunction)   . Gout   . Spinal stenosis   . Hypertension 05/07/2012    Consultants Erline Levine MD - neurosurgery  Imaging:  Imaging Results (Last 48 hours)  Dg Chest Port 1  View  Result Date: 11/10/2016 CLINICAL DATA:  Fall Monday. EXAM: PORTABLE CHEST 1 VIEW COMPARISON:  November 08, 2016 FINDINGS: Persistent right effusion and underlying opacity. Stable cardiomegaly. Right-sided rib fractures. No pneumothorax. No other change. IMPRESSION: Persistent right-sided rib fractures with a right-sided effusion and underlying opacity. No pneumothorax. Electronically Signed   By: Dorise Bullion III M.D   On: 11/10/2016 14:07    CT head, maxillofacial, and cervical spine wo contrast 11/07/16: Small volume of subdural hemorrhage on the right superimposed on a subdural hygroma. Mass effect on the right cerebral convexities is unchanged compared the prior exam. Small volume of subarachnoid hemorrhage on the left. Soft tissue contusion about the right eye. Negative for fracture. No acute abnormality cervical spine. Atrophy and chronic microvascular ischemic change. Atherosclerosis. Multilevel cervical spondylosis.  CT head wo contrast 11/08/16: 1. New small focus of hemorrhage within the right quadrigeminal plate cistern. Additional short interval follow-up recommended to ensure stability. 2. Stable low-attenuation right convexity subdural hygroma. 3. Redistribution of subdural hemorrhage along the falx to the right parietal convexity and tentorium cerebelli with grossly stable volume. 4. Stable small volume of subarachnoid hemorrhage over left lateral frontal lobes and left sylvian fissure. 5. Moderate paranasal sinus disease. These results will be called to the ordering clinician or representative by the Radiologist Assistant, and communication documented in the PACS or zVision Dashboard.  CT head wo contrast 11/09/16: 1. No evidence for new large territory infarct, new intracranial hemorrhage, or significant mass effect. 2. Interval partial dispersion of subarachnoid hemorrhage in left sylvian fissure in over left  frontal convexity. 3. Stable subdural hemorrhage over the  right parietal lobe and right tentorium cerebelli. 4. Stable small focus of hemorrhage within right quadrigeminal plate cistern. 5. Stable right convexity hygroma. 6. Stable moderate paranasal sinus disease.  DG chest port 1 view 11/10/16: Persistent right-sided rib fractures with a right-sided effusion and underlying opacity. No pneumothorax.  Procedures None  Hospital Course:  [name] is an 83yo male who was [facility]07[facility]16 as transfer from [facility] after sustaining a ground level fall at home.  Workup showed a small right subdural hematoma and a left subarachnoid hemorrhage, as well as a right 5th rib fracture. He was anticoagulated on coumadin for atrial fibrillation; this was revered with [place] and vitamin K.  Head laceration was sutured in the ED. Patient was admitted to the ICU for observation.  Neurosurgery consulted for head injuries and recommended nonoperative management. Repeat head CT the following day was stable. Patient remained stable and was transferred to the floor.  Diet was advanced as tolerated.  He worked with therapies during this admission. On 4/9 the patient was voiding well, tolerating diet, ambulating well, pain well controlled, vital signs stable and felt stable for discharge home.  Patient will follow up with neurosurgery in 2 weeks, and with PCP in about 1 week for suture removal. Patient and knows to call with questions or concerns.    I have personally reviewed the patients medication history on the [place] controlled substance database.   He is here today for suture removal. There are approximately 7, 4-0 Prolene sutures closing the laceration above his right eye. These are removed without difficulty. The wound is well-healed. He denies any headache, neurologic deficits, dizziness, nausea or vomiting. However he does complain of pain in his right wrist. He injured his right wrist when he fell. Due to the complexity of his presentation, no x-rays were  obtained of his right wrist during his hospitalization. He is tender to palpation of the distal radius. There is ecchymosis and bruising there and swelling.  At that time, my plan was: Sutures removed without difficulty. No further follow-up is necessary for that. He has no neurologic deficits, worsening headaches, nausea or vomiting. Therefore I do not see a role for repeat imaging of the brain. Patient was on Coumadin for atrial fibrillation with a chads score of 5.  He does have a history of a mitral valve repair.  Therefore his Coumadin was slowly for stroke prevention due to his atrial fibrillation. Neurosurgery recommended discontinuation of Coumadin due to repeated falls and now intracranial hemorrhage.  I agree with their opinion. Patient has proven to be unsteady on his feet and I believe he is at high risk for another fall and head trauma. Therefore I will be in favor of keeping him off Coumadin indefinitely but placing the patient on full aspirin. However my biggest concern right now as I believe he may have fractured his right radius. Place the patient to a thumb spica splint and will send him for an x-ray of the right wrist immediately. Further follow-up will be dictated based on the results of the x-ray  11/21/16 X-ray confirmed an old healed fracture of the distal ulna in the right wrist.  . However this is an area where the patient is extremely tender to palpation. There is also swelling and bruising in this area making me suspicious about an acute on chronic injury. The patient returns today to discuss the x-rays.   Past Medical History:  Diagnosis Date  . Atelectasis of right lung   . Chronic diastolic (congestive) heart failure ([place])    a. 10/2013 Echo: EF 55-60%, basal-mid anteroseptal and basal-mid inferoseptal HK.  . Chronic fatigue   . Chronic subdural hematoma ([place])    a. 12/2015 Head CT: chronic right holo-hemispheric SDH w/o midline shift.  . Coronary artery disease    a. 11/2014  NSTEMI: DESx 2 placed to LAD and RCA; b. 08/2015 Cath: LAD patent stent, LCX 23m, RCA patent stent, 64m.  . Diabetes mellitus, type II ([place])    a. HgA1c 6.8 in 03/2015  . ED (erectile dysfunction)   . Gout   . Hemangioma of liver 08/02/2015   12 mm enhancing lesion seen on CT - suspicious for benign hemangioma but not diagnostic - MRI recommended  . HLD (hyperlipidemia)   . Hypertensive heart disease   . Kidney stones   . Lower extremity edema    a. chronic  . OA (osteoarthritis)   . Orthostatic hypotension   . PAF (paroxysmal atrial fibrillation) ([place])    a. 10/2015 s/p DCCV;  b. CHA2DS2VASc = 6--> on coumadin; c. 10/2015 Echo: sev dil LA.  Marland Kitchen Physical deconditioning   . Prostate cancer ([place])   . Pulmonary HTN ([place])   . Pulmonary hypertension ([place])    a. 10/2015 Echo: PASP 41mmHg.  Marland Kitchen Severe mitral regurgitation    a.  10/2015 s/p minimally invasive MV repair; b. 10/2015 Echo: mod MS, mean grad 10mmHg.  Marland Kitchen Spinal stenosis   . SVT (supraventricular tachycardia) ([facility])    a. recurrent, usually responsive to vagal manuevers  . Tricuspid regurgitation    a. 10/2015 Echo: mild to mod TR.   Past Surgical History:  Procedure Laterality Date  . BACK SURGERY    . Back surgery x 2    . CARDIAC CATHETERIZATION N/A 08/08/2015   Procedure: Right/Left Heart Cath and Coronary Angiography;  Surgeon: Sherren Mocha, MD;  Location: [place] [facility];  Service: Cardiovascular;  Laterality: N/A;  . CARDIAC SURGERY     2 cardiac stents  . CARDIOVERSION N/A 10/20/2015   Procedure: CARDIOVERSION;  Surgeon: Pixie Casino, MD;  Location: [place];  Service: Cardiovascular;  Laterality: N/A;  . CERVICAL SPINE SURGERY    . CHOLECYSTECTOMY N/A 07/08/2016   Procedure: LAPAROSCOPIC CHOLECYSTECTOMY WITH INTRAOPERATIVE CHOLANGIOGRAM;  Surgeon: Georganna Skeans, MD;  Location: [place];  Service: General;  Laterality: N/A;  . EYE SURGERY    . INTRAOPERATIVE TRANSESOPHAGEAL ECHOCARDIOGRAM  10/05/2015   Procedure:  INTRAOPERATIVE TRANSESOPHAGEAL ECHOCARDIOGRAM;  Surgeon: Rexene Alberts, MD;  [facility];  Service: Open Heart Surgery;;  . JOINT REPLACEMENT     shoulder  . LEFT HEART CATHETERIZATION WITH CORONARY ANGIOGRAM N/A 11/16/2014   Procedure: LEFT HEART CATHETERIZATION WITH CORONARY ANGIOGRAM;  Surgeon: Troy Sine, MD;  Location: [facility] CATH LAB;  Service: Cardiovascular;  Laterality: N/A;  . MITRAL VALVE REPAIR Right 10/04/2015   Procedure: MINIMALLY INVASIVE MITRAL VALVE REPAIR (MVR);  Surgeon: Rexene Alberts, MD;  Location: [place];  Service: Open Heart Surgery;  Laterality: Right;  . Rt rotator cuff repair    . TEE WITHOUT CARDIOVERSION N/A 06/22/2015   Procedure: TRANSESOPHAGEAL ECHOCARDIOGRAM (TEE);  Surgeon: Skeet Latch, MD;  Location: [place];  Service: Cardiovascular;  Laterality: N/A;  . TEE WITHOUT CARDIOVERSION N/A 10/04/2015   Procedure: TRANSESOPHAGEAL ECHOCARDIOGRAM (TEE);  Surgeon: Rexene Alberts, MD;  Location: [place];  Service: Open Heart Surgery;  Laterality: N/A;  . WOUND  EXPLORATION N/A 10/05/2015   Procedure: Sternal Incision;  Surgeon: Rexene Alberts, MD;  Location: [place];  Service: Open Heart Surgery;  Laterality: N/A;   Current Outpatient Prescriptions on File Prior to Visit  Medication Sig Dispense Refill  . allopurinol (ZYLOPRIM) 100 MG tablet Take 1 tablet (100 mg total) by mouth daily. 30 tablet 5  . colchicine 0.6 MG tablet Take 1 tablet (0.6 mg total) by mouth daily. 30 tablet 5  . finasteride (PROSCAR) 5 MG tablet TAKE ONE (1) TABLET EACH DAY 30 tablet 11  . furosemide (LASIX) 40 MG tablet Take 1 tablet (40 mg total) by mouth 2 (two) times daily. 60 tablet 3  . magnesium oxide (MAG-OX) 400 MG tablet Take 1 tablet (400 mg total) by mouth daily. 30 tablet 0  . metoprolol tartrate (LOPRESSOR) 25 MG tablet Take 0.5 tablets (12.5 mg total) by mouth 2 (two) times daily. 60 tablet 3  . OVER THE COUNTER MEDICATION Neuroquell: Take 1 capsule by mouth once a day for  immune health    . potassium chloride (KLOR-CON) 20 MEQ packet Take 20 mEq by mouth daily. 30 packet 0  . pravastatin (PRAVACHOL) 40 MG tablet Take 1 tablet (40 mg total) by mouth daily at 6 PM. 30 tablet 11  . predniSONE (DELTASONE) 20 MG tablet 3 tabs poqday 1-2, 2 tabs poqday 3-4, 1 tab poqday 5-6 12 tablet 0  . traMADol (ULTRAM) 50 MG tablet Take 1 tablet (50 mg total) by mouth every 6 (six) hours as needed for moderate pain. 30 tablet 0  . Wheat Dextrin (BENEFIBER) POWD Take by mouth See admin instructions. Mix one teaspoonful into 6-8 ounces of fluid/milk and drink once daily     No current facility-administered medications on file prior to visit.    No Known Allergies Social History   Social History  . Marital status: Married    Spouse name: N/A  . Number of children: 4  . Years of education: N/A   Occupational History  .  Retired   Social History Main Topics  . Smoking status: Former Smoker    Types: Cigarettes, Pipe, Landscape architect  . Smokeless tobacco: Former Systems developer    Types: Chew     Comment: [place] YEARS AGO"  . Alcohol use No  . Drug use: No  . Sexual activity: Not on file   Other Topics Concern  . Not on file   Social History Narrative   ** Merged History Encounter **       Four adopted children.  Lives alone.     Review of Systems  All other systems reviewed and are negative.      Objective:   Physical Exam  Constitutional: He is oriented to person, place, and time. He appears well-developed and well-nourished. No distress.  HENT:  Head: Normocephalic and atraumatic.  Right Ear: External ear normal.  Left Ear: External ear normal.  Nose: Nose normal.  Mouth/Throat: Oropharynx is clear and moist. No oropharyngeal exudate.  Eyes: Conjunctivae and EOM are normal. Pupils are equal, round, and reactive to light. Right eye exhibits no discharge. Left eye exhibits no discharge. No scleral icterus.  Neck: Normal range of motion. Neck supple. No JVD  present. No tracheal deviation present. No thyromegaly present.  Cardiovascular: Normal rate and regular rhythm.   Murmur heard. Pulmonary/Chest: Effort normal and breath sounds normal. No stridor. No respiratory distress. He has no wheezes. He has no rales.  Abdominal: Soft. Bowel sounds are normal.  Musculoskeletal:       Right wrist: He exhibits decreased range of motion, tenderness, bony tenderness and swelling.  Lymphadenopathy:    He has no cervical adenopathy.  Neurological: He is alert and oriented to person, place, and time. He displays normal reflexes. No cranial nerve deficit. He exhibits normal muscle tone. Coordination normal.  Skin: He is not diaphoretic.  Vitals reviewed.         Assessment & Plan:  Fracture of the distal ulna Patient was placed in a long-arm cast above the elbow here today in office. This is to stabilize the fracture until he can be seen by orthopedics for definitive management as my casting is poor to say the least.  He has an appointment to see Dr. Sherrian Divers on Monday.

## 2016-11-22 DIAGNOSIS — I5032 Chronic diastolic (congestive) heart failure: Secondary | ICD-10-CM | POA: Diagnosis not present

## 2016-11-22 DIAGNOSIS — R471 Dysarthria and anarthria: Secondary | ICD-10-CM | POA: Diagnosis not present

## 2016-11-22 DIAGNOSIS — I272 Pulmonary hypertension, unspecified: Secondary | ICD-10-CM | POA: Diagnosis not present

## 2016-11-22 DIAGNOSIS — I340 Nonrheumatic mitral (valve) insufficiency: Secondary | ICD-10-CM | POA: Diagnosis not present

## 2016-11-22 DIAGNOSIS — Z7901 Long term (current) use of anticoagulants: Secondary | ICD-10-CM | POA: Diagnosis not present

## 2016-11-22 DIAGNOSIS — E119 Type 2 diabetes mellitus without complications: Secondary | ICD-10-CM | POA: Diagnosis not present

## 2016-11-22 DIAGNOSIS — I480 Paroxysmal atrial fibrillation: Secondary | ICD-10-CM | POA: Diagnosis not present

## 2016-11-22 DIAGNOSIS — S2231XD Fracture of one rib, right side, subsequent encounter for fracture with routine healing: Secondary | ICD-10-CM | POA: Diagnosis not present

## 2016-11-22 DIAGNOSIS — I6203 Nontraumatic chronic subdural hemorrhage: Secondary | ICD-10-CM | POA: Diagnosis not present

## 2016-11-22 DIAGNOSIS — S065X0S Traumatic subdural hemorrhage without loss of consciousness, sequela: Secondary | ICD-10-CM | POA: Diagnosis not present

## 2016-11-22 DIAGNOSIS — M6281 Muscle weakness (generalized): Secondary | ICD-10-CM | POA: Diagnosis not present

## 2016-11-22 DIAGNOSIS — E785 Hyperlipidemia, unspecified: Secondary | ICD-10-CM | POA: Diagnosis not present

## 2016-11-22 DIAGNOSIS — I110 Hypertensive heart disease with heart failure: Secondary | ICD-10-CM | POA: Diagnosis not present

## 2016-11-22 DIAGNOSIS — Z794 Long term (current) use of insulin: Secondary | ICD-10-CM | POA: Diagnosis not present

## 2016-11-22 DIAGNOSIS — I2510 Atherosclerotic heart disease of native coronary artery without angina pectoris: Secondary | ICD-10-CM | POA: Diagnosis not present

## 2016-11-25 ENCOUNTER — Encounter (INDEPENDENT_AMBULATORY_CARE_PROVIDER_SITE_OTHER): Payer: Self-pay

## 2016-11-25 ENCOUNTER — Ambulatory Visit (INDEPENDENT_AMBULATORY_CARE_PROVIDER_SITE_OTHER): Payer: Medicare Other | Admitting: Orthopaedic Surgery

## 2016-11-25 ENCOUNTER — Encounter (INDEPENDENT_AMBULATORY_CARE_PROVIDER_SITE_OTHER): Payer: Self-pay | Admitting: Orthopaedic Surgery

## 2016-11-25 DIAGNOSIS — S52691A Other fracture of lower end of right ulna, initial encounter for closed fracture: Secondary | ICD-10-CM | POA: Diagnosis not present

## 2016-11-25 DIAGNOSIS — S52601A Unspecified fracture of lower end of right ulna, initial encounter for closed fracture: Secondary | ICD-10-CM | POA: Insufficient documentation

## 2016-11-25 NOTE — Progress Notes (Signed)
Office Visit Note   Patient: [name]           Date of Birth: 12-Sep-1928           MRN: [id number] Visit Date: 11/25/2016              Requested by: [name], MD [id number] [facility] [place], [place] [address] PCP: [name], MD   Assessment & Plan: Visit Diagnoses:  1. Other closed fracture of distal end of right ulna, initial encounter     Plan: I reviewed his x-rays from [facility] which does show that he should heal just fine with nonoperative treatment. We did switch him over to a short arm cast. I will like to see him back in 5 weeks for repeat 2 view x-rays of the right wrist out of the cast.  Follow-Up Instructions: Return in about 5 weeks (around 12/30/2016).   Orders:  No orders of the defined types were placed in this encounter.  No orders of the defined types were placed in this encounter.     Procedures: No procedures performed   Clinical Data: No additional findings.   Subjective: Chief Complaint  Patient presents with  . Right Wrist - Pain    Patient is a 83 year old gentleman who comes in with a minimally displaced distal ulnar shaft fracture from mechanical fall about 10 days ago. He is currently in a long-arm cast. It is PCP referred him to [place] for further evaluation and treatment. He endorses mild pain. He is mainly homebound has been retired since the 61s. The pain does not radiate.    Review of Systems  Constitutional: Negative.   All other systems reviewed and are negative.    Objective: Vital Signs: There were no vitals taken for this visit.  Physical Exam  Constitutional: He is oriented to person, place, and time. He appears well-developed and well-nourished.  HENT:  Head: Normocephalic and atraumatic.  Eyes: Pupils are equal, round, and reactive to light.  Neck: Neck supple.  Pulmonary/Chest: Effort normal.  Abdominal: Soft.  Musculoskeletal: Normal range of motion.  Neurological: He is alert and oriented to  person, place, and time.  Skin: Skin is warm.  Psychiatric: He has a normal mood and affect. His behavior is normal. Judgment and thought content normal.  Nursing note and vitals reviewed.   Ortho Exam Right upper extremity exam shows a wel fitting long arm cast. The fingers are warm well-perfused. No significant pain with passive stretch.l Specialty Comments:  No specialty comments available.  Imaging: No results found.   PMFS History: Patient Active Problem List   Diagnosis Date Noted  . Closed fracture of lower end of right ulna 11/25/2016  . SDH (subdural hematoma) ([place]) 11/07/2016  . Subdural hematoma ([place]) 11/07/2016  . Chronic diastolic CHF (congestive heart failure) ([place]) 07/03/2016  . Gallstone pancreatitis 07/03/2016  . Hypertensive heart disease   . Syncope 12/11/2015  . Orthostatic hypotension   . Weakness 12/09/2015  . PAF (paroxysmal atrial fibrillation) ([place])   . CAD (coronary artery disease)   . Pressure ulcer 10/19/2015  . Long term (current) use of anticoagulants [Z79.01] 10/17/2015  . S/P minimally invasive mitral valve repair 10/04/2015  . Physical deconditioning   . Hemangioma of liver 08/02/2015  . Diabetes mellitus, type II ([place])   . Coronary artery disease   . Pulmonary HTN ([place])   . Lower extremity edema   . History of PSVT (paroxysmal supraventricular tachycardia) 11/15/2014  .  Dyslipidemia 11/15/2014  . History of prostate cancer 11/15/2014  . Dry eye 09/03/2013  . TBI (traumatic brain injury) ([place]) 11/25/2012  . Orbit fracture ([place]) 11/25/2012  . Maxillary sinus fracture ([place]) 11/25/2012  . Frequent falls 11/25/2012  . OA (osteoarthritis)   . Metabolic syndrome   . ED (erectile dysfunction)   . Gout   . Spinal stenosis   . Hypertension 05/07/2012   Past Medical History:  Diagnosis Date  . Atelectasis of right lung   . Chronic diastolic (congestive) heart failure ([place])    a. 10/2013 Echo: EF 55-60%, basal-mid anteroseptal and  basal-mid inferoseptal HK.  . Chronic fatigue   . Chronic subdural hematoma ([place])    a. 12/2015 Head CT: chronic right holo-hemispheric SDH w/o midline shift.  . Coronary artery disease    a. 11/2014 NSTEMI: DESx 2 placed to LAD and RCA; b. 08/2015 Cath: LAD patent stent, LCX 96m, RCA patent stent, 9m.  . Diabetes mellitus, type II ([place])    a. HgA1c 6.8 in 03/2015  . ED (erectile dysfunction)   . Gout   . Hemangioma of liver 08/02/2015   12 mm enhancing lesion seen on CT - suspicious for benign hemangioma but not diagnostic - MRI recommended  . HLD (hyperlipidemia)   . Hypertensive heart disease   . Kidney stones   . Lower extremity edema    a. chronic  . OA (osteoarthritis)   . Orthostatic hypotension   . PAF (paroxysmal atrial fibrillation) ([place])    a. 10/2015 s/p DCCV;  b. CHA2DS2VASc = 6--> on coumadin; c. 10/2015 Echo: sev dil LA.  Marland Kitchen Physical deconditioning   . Prostate cancer ([place])   . Pulmonary HTN ([place])   . Pulmonary hypertension ([place])    a. 10/2015 Echo: PASP 77mmHg.  Marland Kitchen Severe mitral regurgitation    a.  10/2015 s/p minimally invasive MV repair; b. 10/2015 Echo: mod MS, mean grad 43mmHg.  Marland Kitchen Spinal stenosis   . SVT (supraventricular tachycardia) ([facility])    a. recurrent, usually responsive to vagal manuevers  . Tricuspid regurgitation    a. 10/2015 Echo: mild to mod TR.    Family History  Problem Relation Age of Onset  . Heart failure Mother 73  . Heart attack Mother   . Heart attack Brother     Past Surgical History:  Procedure Laterality Date  . BACK SURGERY    . Back surgery x 2    . CARDIAC CATHETERIZATION N/A 08/08/2015   Procedure: Right/Left Heart Cath and Coronary Angiography;  Surgeon: Sherren Mocha, MD;  Location: [facility];  Service: Cardiovascular;  Laterality: N/A;  . CARDIAC SURGERY     2 cardiac stents  . CARDIOVERSION N/A 10/20/2015   Procedure: CARDIOVERSION;  Surgeon: Pixie Casino, MD;  Location: [place];  Service: Cardiovascular;   Laterality: N/A;  . CERVICAL SPINE SURGERY    . CHOLECYSTECTOMY N/A 07/08/2016   Procedure: LAPAROSCOPIC CHOLECYSTECTOMY WITH INTRAOPERATIVE CHOLANGIOGRAM;  Surgeon: Georganna Skeans, MD;  Location: [place];  Service: General;  Laterality: N/A;  . EYE SURGERY    . INTRAOPERATIVE TRANSESOPHAGEAL ECHOCARDIOGRAM  10/05/2015   Procedure: INTRAOPERATIVE TRANSESOPHAGEAL ECHOCARDIOGRAM;  Surgeon: Rexene Alberts, MD;  [facility];  Service: Open Heart Surgery;;  . JOINT REPLACEMENT     shoulder  . LEFT HEART CATHETERIZATION WITH CORONARY ANGIOGRAM N/A 11/16/2014   Procedure: LEFT HEART CATHETERIZATION WITH CORONARY ANGIOGRAM;  Surgeon: Troy Sine, MD;  Location: [facility] CATH LAB;  Service: Cardiovascular;  Laterality:  N/A;  . MITRAL VALVE REPAIR Right 10/04/2015   Procedure: MINIMALLY INVASIVE MITRAL VALVE REPAIR (MVR);  Surgeon: Rexene Alberts, MD;  Location: [place];  Service: Open Heart Surgery;  Laterality: Right;  . Rt rotator cuff repair    . TEE WITHOUT CARDIOVERSION N/A 06/22/2015   Procedure: TRANSESOPHAGEAL ECHOCARDIOGRAM (TEE);  Surgeon: Skeet Latch, MD;  Location: [place];  Service: Cardiovascular;  Laterality: N/A;  . TEE WITHOUT CARDIOVERSION N/A 10/04/2015   Procedure: TRANSESOPHAGEAL ECHOCARDIOGRAM (TEE);  Surgeon: Rexene Alberts, MD;  Location: [place];  Service: Open Heart Surgery;  Laterality: N/A;  . WOUND EXPLORATION N/A 10/05/2015   Procedure: Sternal Incision;  Surgeon: Rexene Alberts, MD;  Location: [place];  Service: Open Heart Surgery;  Laterality: N/A;   Social History   Occupational History  .  Retired   Social History Main Topics  . Smoking status: Former Smoker    Types: Cigarettes, Pipe, Landscape architect  . Smokeless tobacco: Former Systems developer    Types: Chew     Comment: [place] YEARS AGO"  . Alcohol use No  . Drug use: No  . Sexual activity: Not on file

## 2016-11-26 DIAGNOSIS — I2510 Atherosclerotic heart disease of native coronary artery without angina pectoris: Secondary | ICD-10-CM | POA: Diagnosis not present

## 2016-11-26 DIAGNOSIS — I5032 Chronic diastolic (congestive) heart failure: Secondary | ICD-10-CM | POA: Diagnosis not present

## 2016-11-26 DIAGNOSIS — S2231XD Fracture of one rib, right side, subsequent encounter for fracture with routine healing: Secondary | ICD-10-CM | POA: Diagnosis not present

## 2016-11-26 DIAGNOSIS — I480 Paroxysmal atrial fibrillation: Secondary | ICD-10-CM | POA: Diagnosis not present

## 2016-11-26 DIAGNOSIS — M6281 Muscle weakness (generalized): Secondary | ICD-10-CM | POA: Diagnosis not present

## 2016-11-26 DIAGNOSIS — Z7901 Long term (current) use of anticoagulants: Secondary | ICD-10-CM | POA: Diagnosis not present

## 2016-11-26 DIAGNOSIS — Z794 Long term (current) use of insulin: Secondary | ICD-10-CM | POA: Diagnosis not present

## 2016-11-26 DIAGNOSIS — I6203 Nontraumatic chronic subdural hemorrhage: Secondary | ICD-10-CM | POA: Diagnosis not present

## 2016-11-26 DIAGNOSIS — I340 Nonrheumatic mitral (valve) insufficiency: Secondary | ICD-10-CM | POA: Diagnosis not present

## 2016-11-26 DIAGNOSIS — R471 Dysarthria and anarthria: Secondary | ICD-10-CM | POA: Diagnosis not present

## 2016-11-26 DIAGNOSIS — I272 Pulmonary hypertension, unspecified: Secondary | ICD-10-CM | POA: Diagnosis not present

## 2016-11-26 DIAGNOSIS — S065X0S Traumatic subdural hemorrhage without loss of consciousness, sequela: Secondary | ICD-10-CM | POA: Diagnosis not present

## 2016-11-26 DIAGNOSIS — I110 Hypertensive heart disease with heart failure: Secondary | ICD-10-CM | POA: Diagnosis not present

## 2016-11-26 DIAGNOSIS — E119 Type 2 diabetes mellitus without complications: Secondary | ICD-10-CM | POA: Diagnosis not present

## 2016-11-26 DIAGNOSIS — E785 Hyperlipidemia, unspecified: Secondary | ICD-10-CM | POA: Diagnosis not present

## 2016-11-29 ENCOUNTER — Telehealth: Payer: Self-pay | Admitting: Cardiology

## 2016-11-29 DIAGNOSIS — I2510 Atherosclerotic heart disease of native coronary artery without angina pectoris: Secondary | ICD-10-CM | POA: Diagnosis not present

## 2016-11-29 DIAGNOSIS — M6281 Muscle weakness (generalized): Secondary | ICD-10-CM | POA: Diagnosis not present

## 2016-11-29 DIAGNOSIS — Z7901 Long term (current) use of anticoagulants: Secondary | ICD-10-CM | POA: Diagnosis not present

## 2016-11-29 DIAGNOSIS — S2231XD Fracture of one rib, right side, subsequent encounter for fracture with routine healing: Secondary | ICD-10-CM | POA: Diagnosis not present

## 2016-11-29 DIAGNOSIS — S065X0S Traumatic subdural hemorrhage without loss of consciousness, sequela: Secondary | ICD-10-CM | POA: Diagnosis not present

## 2016-11-29 DIAGNOSIS — I480 Paroxysmal atrial fibrillation: Secondary | ICD-10-CM | POA: Diagnosis not present

## 2016-11-29 DIAGNOSIS — I272 Pulmonary hypertension, unspecified: Secondary | ICD-10-CM | POA: Diagnosis not present

## 2016-11-29 DIAGNOSIS — E119 Type 2 diabetes mellitus without complications: Secondary | ICD-10-CM | POA: Diagnosis not present

## 2016-11-29 DIAGNOSIS — E785 Hyperlipidemia, unspecified: Secondary | ICD-10-CM | POA: Diagnosis not present

## 2016-11-29 DIAGNOSIS — I5032 Chronic diastolic (congestive) heart failure: Secondary | ICD-10-CM | POA: Diagnosis not present

## 2016-11-29 DIAGNOSIS — I340 Nonrheumatic mitral (valve) insufficiency: Secondary | ICD-10-CM | POA: Diagnosis not present

## 2016-11-29 DIAGNOSIS — I6203 Nontraumatic chronic subdural hemorrhage: Secondary | ICD-10-CM | POA: Diagnosis not present

## 2016-11-29 DIAGNOSIS — Z794 Long term (current) use of insulin: Secondary | ICD-10-CM | POA: Diagnosis not present

## 2016-11-29 DIAGNOSIS — I110 Hypertensive heart disease with heart failure: Secondary | ICD-10-CM | POA: Diagnosis not present

## 2016-11-29 DIAGNOSIS — R471 Dysarthria and anarthria: Secondary | ICD-10-CM | POA: Diagnosis not present

## 2016-11-29 NOTE — Telephone Encounter (Signed)
Pt spouse calling stating pt has been off his coumadin for the last 2 weeks States he had an accident and was told by someone in the hospital not to take it. She is asking patient has an upcoming apt for this, is this needed ?  Please advise.

## 2016-11-29 NOTE — Telephone Encounter (Signed)
Patient does not need to keep INR appt.  Please let them know and cancel the appt.  I will pass this information on to [facility] (Coumadin RN at [employer]) and Dr. Percival Spanish

## 2016-11-29 NOTE — Telephone Encounter (Signed)
Called patient, spoke to physical therapist who relayed the message to patient. He understood not to come on Wednesday

## 2016-12-04 DIAGNOSIS — I6203 Nontraumatic chronic subdural hemorrhage: Secondary | ICD-10-CM | POA: Diagnosis not present

## 2016-12-04 DIAGNOSIS — R471 Dysarthria and anarthria: Secondary | ICD-10-CM | POA: Diagnosis not present

## 2016-12-04 DIAGNOSIS — E785 Hyperlipidemia, unspecified: Secondary | ICD-10-CM | POA: Diagnosis not present

## 2016-12-04 DIAGNOSIS — S065X0S Traumatic subdural hemorrhage without loss of consciousness, sequela: Secondary | ICD-10-CM | POA: Diagnosis not present

## 2016-12-04 DIAGNOSIS — I110 Hypertensive heart disease with heart failure: Secondary | ICD-10-CM | POA: Diagnosis not present

## 2016-12-04 DIAGNOSIS — M6281 Muscle weakness (generalized): Secondary | ICD-10-CM | POA: Diagnosis not present

## 2016-12-04 DIAGNOSIS — I5032 Chronic diastolic (congestive) heart failure: Secondary | ICD-10-CM | POA: Diagnosis not present

## 2016-12-04 DIAGNOSIS — I340 Nonrheumatic mitral (valve) insufficiency: Secondary | ICD-10-CM | POA: Diagnosis not present

## 2016-12-04 DIAGNOSIS — S2231XD Fracture of one rib, right side, subsequent encounter for fracture with routine healing: Secondary | ICD-10-CM | POA: Diagnosis not present

## 2016-12-04 DIAGNOSIS — I480 Paroxysmal atrial fibrillation: Secondary | ICD-10-CM | POA: Diagnosis not present

## 2016-12-04 DIAGNOSIS — Z794 Long term (current) use of insulin: Secondary | ICD-10-CM | POA: Diagnosis not present

## 2016-12-04 DIAGNOSIS — Z7901 Long term (current) use of anticoagulants: Secondary | ICD-10-CM | POA: Diagnosis not present

## 2016-12-04 DIAGNOSIS — I2510 Atherosclerotic heart disease of native coronary artery without angina pectoris: Secondary | ICD-10-CM | POA: Diagnosis not present

## 2016-12-04 DIAGNOSIS — E119 Type 2 diabetes mellitus without complications: Secondary | ICD-10-CM | POA: Diagnosis not present

## 2016-12-04 DIAGNOSIS — I272 Pulmonary hypertension, unspecified: Secondary | ICD-10-CM | POA: Diagnosis not present

## 2016-12-19 ENCOUNTER — Ambulatory Visit: Payer: Medicare Other | Admitting: Family Medicine

## 2016-12-31 ENCOUNTER — Ambulatory Visit (INDEPENDENT_AMBULATORY_CARE_PROVIDER_SITE_OTHER): Payer: Medicare Other

## 2016-12-31 ENCOUNTER — Ambulatory Visit (INDEPENDENT_AMBULATORY_CARE_PROVIDER_SITE_OTHER): Payer: Medicare Other | Admitting: Orthopaedic Surgery

## 2016-12-31 ENCOUNTER — Encounter (INDEPENDENT_AMBULATORY_CARE_PROVIDER_SITE_OTHER): Payer: Self-pay | Admitting: Orthopaedic Surgery

## 2016-12-31 DIAGNOSIS — S52691A Other fracture of lower end of right ulna, initial encounter for closed fracture: Secondary | ICD-10-CM

## 2016-12-31 NOTE — Progress Notes (Signed)
Patient is 6 weeks status post right distal ulnar shaft fracture. He is doing well. He denies any pain. He has no swelling. X-ray show stable alignment with abundant callus formation. At this point he may weight-bear as tolerated in a wrist brace. I would like him to do home health occupational therapy. I'll see him back in 6 weeks recheck. He's 2 view x-rays of the right wrist on return.

## 2017-01-21 ENCOUNTER — Other Ambulatory Visit: Payer: Self-pay | Admitting: Family Medicine

## 2017-01-28 ENCOUNTER — Encounter: Payer: Self-pay | Admitting: Family Medicine

## 2017-01-28 ENCOUNTER — Ambulatory Visit (INDEPENDENT_AMBULATORY_CARE_PROVIDER_SITE_OTHER): Payer: Medicare Other | Admitting: Family Medicine

## 2017-01-28 VITALS — BP 128/70 | HR 56 | Temp 97.9°F | Resp 14 | Ht 67.0 in | Wt 193.0 lb

## 2017-01-28 DIAGNOSIS — R634 Abnormal weight loss: Secondary | ICD-10-CM | POA: Diagnosis not present

## 2017-01-28 DIAGNOSIS — R1310 Dysphagia, unspecified: Secondary | ICD-10-CM | POA: Diagnosis not present

## 2017-01-28 LAB — CBC WITH DIFFERENTIAL/PLATELET
BASOS PCT: 1 %
Basophils Absolute: 74 cells/uL (ref 0–200)
EOS ABS: 518 {cells}/uL — AB (ref 15–500)
Eosinophils Relative: 7 %
HCT: 44.9 % (ref 38.5–50.0)
Hemoglobin: 14.5 g/dL (ref 13.0–17.0)
LYMPHS PCT: 35 %
Lymphs Abs: 2590 cells/uL (ref 850–3900)
MCH: 30.1 pg (ref 27.0–33.0)
MCHC: 32.3 g/dL (ref 32.0–36.0)
MCV: 93.2 fL (ref 80.0–100.0)
MONO ABS: 518 {cells}/uL (ref 200–950)
MONOS PCT: 7 %
MPV: 10.1 fL (ref 7.5–12.5)
NEUTROS PCT: 50 %
Neutro Abs: 3700 cells/uL (ref 1500–7800)
PLATELETS: 164 10*3/uL (ref 140–400)
RBC: 4.82 MIL/uL (ref 4.20–5.80)
RDW: 14.8 % (ref 11.0–15.0)
WBC: 7.4 10*3/uL (ref 3.8–10.8)

## 2017-01-28 NOTE — Progress Notes (Signed)
   Subjective:    Patient ID: [name], male    DOB: 1929/01/23, 83 y.o.   MRN: [id number]  Patient presents for Dysphagia (pt reports throat pain and difficulty swallowing medications- doesn't want to eat d/t pain) Patient here with his wife. He's had sore throat and pain with swallowing on and off for months. He did not bring this up however the past couple of visits. Now he has difficulty getting his pills down and he came in for visit. His weight is down 7 pounds since March. He is not sure what particular foods get stuck but feels like he can eat anything without having some coughing spells. As noted he did have right subdural hematoma as well as left to sub arachnoid hematoma after a fall a couple months ago. He cannot tell me if the symptoms worsened after that but he and his wife states that they were present even before the fall. He has not been drinking any boost or ensure. He does have likely low protein state. He states that he is trying to take all his medicines as prescribed and wife admits that he is.    Review Of Systems:  GEN- denies fatigue, fever, +weight loss,weakness, recent illness HEENT- denies eye drainage, change in vision, nasal discharge, CVS- denies chest pain, palpitations RESP- denies SOB, cough, wheeze ABD- denies N/V, change in stools, abd pain GU- denies dysuria, hematuria, dribbling, incontinence MSK- denies joint pain, muscle aches, injury Neuro- denies headache, dizziness, syncope, seizure activity       Objective:    BP 128/70   Pulse (!) 56   Temp 97.9 F (36.6 C) (Oral)   Resp 14   Ht 5\' 7"  (1.702 m)   Wt 193 lb (87.5 kg)   SpO2 97%   BMI 30.23 kg/m  GEN- NAD, alert and oriented x3 HEENT- PERRL, EOMI, non injected sclera, pink conjunctiva, MMM, oropharynx clear Neck- Supple, no thyromegaly, no LAD  CVS- RRR, systolic murmur  RESP-CTAB ABD-NABS,soft,NT,ND EXT- No edema Pulses- Radial  2+        Assessment & Plan:       Problem List Items Addressed This Visit    None    Visit Diagnoses    Dysphagia, unspecified type    -  Primary   ? motility disorder, esophogel lesion, reflux. Malignancy.Start with barium swallow, add boost, soft food consistency, plan for EGD/GI if nothing on barium He did have imaging done on his abdomen back in December 2017 at that time he had mild pancreatitis and some cystic structures but no sign of any malignancy in the abdominal area at that time. This would also need to be reevaluated if nothing is found otherwise with regards to his swallowing and discomfort.    Relevant Orders   CBC with Differential/Platelet (Completed)   Comprehensive metabolic panel (Completed)   DG Esophagus   Weight loss       Noted, due to decreased intake, repeat labs unremarkable   Relevant Orders   CBC with Differential/Platelet (Completed)   Comprehensive metabolic panel (Completed)   DG Esophagus      Note: This dictation was prepared with Dragon dictation along with smaller phrase technology. Any transcriptional errors that result from this process are unintentional.

## 2017-01-28 NOTE — Patient Instructions (Signed)
Swallowing study to be done at [place] [facility]- Afternoon Drink the ensure  F/U pending results

## 2017-01-29 LAB — COMPREHENSIVE METABOLIC PANEL
ALK PHOS: 125 U/L — AB (ref 40–115)
ALT: 18 U/L (ref 9–46)
AST: 36 U/L — ABNORMAL HIGH (ref 10–35)
Albumin: 3 g/dL — ABNORMAL LOW (ref 3.6–5.1)
BUN: 17 mg/dL (ref 7–25)
CHLORIDE: 100 mmol/L (ref 98–110)
CO2: 25 mmol/L (ref 20–31)
Calcium: 9.1 mg/dL (ref 8.6–10.3)
Creat: 1.16 mg/dL — ABNORMAL HIGH (ref 0.70–1.11)
GLUCOSE: 108 mg/dL — AB (ref 70–99)
POTASSIUM: 3.9 mmol/L (ref 3.5–5.3)
Sodium: 137 mmol/L (ref 135–146)
Total Bilirubin: 0.7 mg/dL (ref 0.2–1.2)
Total Protein: 6.4 g/dL (ref 6.1–8.1)

## 2017-01-30 ENCOUNTER — Ambulatory Visit
Admission: RE | Admit: 2017-01-30 | Discharge: 2017-01-30 | Disposition: A | Discharge status: Home / Self Care | Payer: Medicare Other | Source: Ambulatory Visit | Attending: Family Medicine | Admitting: Family Medicine

## 2017-01-30 ENCOUNTER — Other Ambulatory Visit: Payer: Self-pay | Admitting: *Deleted

## 2017-01-30 DIAGNOSIS — R4702 Dysphasia: Secondary | ICD-10-CM | POA: Diagnosis not present

## 2017-01-30 DIAGNOSIS — R1310 Dysphagia, unspecified: Secondary | ICD-10-CM | POA: Diagnosis not present

## 2017-01-30 DIAGNOSIS — R05 Cough: Secondary | ICD-10-CM | POA: Diagnosis not present

## 2017-01-30 DIAGNOSIS — R634 Abnormal weight loss: Secondary | ICD-10-CM | POA: Diagnosis present

## 2017-01-30 NOTE — Progress Notes (Signed)
Left msg at [facility] specialty number: [phone] to set up modified barium swallow since they had gone home for the day.

## 2017-02-03 ENCOUNTER — Telehealth: Payer: Self-pay | Admitting: *Deleted

## 2017-02-03 ENCOUNTER — Other Ambulatory Visit: Payer: Self-pay | Admitting: Family Medicine

## 2017-02-03 DIAGNOSIS — R1312 Dysphagia, oropharyngeal phase: Secondary | ICD-10-CM

## 2017-02-03 NOTE — Telephone Encounter (Signed)
Barium Swallow scheduled for 7/18 at 12:30 P.M. Wife notified.

## 2017-02-03 NOTE — Telephone Encounter (Signed)
Forward to PCP as well for [facility]

## 2017-02-11 ENCOUNTER — Encounter (INDEPENDENT_AMBULATORY_CARE_PROVIDER_SITE_OTHER): Payer: Self-pay | Admitting: Orthopaedic Surgery

## 2017-02-11 ENCOUNTER — Ambulatory Visit (INDEPENDENT_AMBULATORY_CARE_PROVIDER_SITE_OTHER): Payer: Medicare Other | Admitting: Orthopaedic Surgery

## 2017-02-11 ENCOUNTER — Telehealth: Payer: Self-pay | Admitting: Cardiology

## 2017-02-11 ENCOUNTER — Ambulatory Visit (INDEPENDENT_AMBULATORY_CARE_PROVIDER_SITE_OTHER): Payer: Medicare Other

## 2017-02-11 DIAGNOSIS — S52691D Other fracture of lower end of right ulna, subsequent encounter for closed fracture with routine healing: Secondary | ICD-10-CM

## 2017-02-11 NOTE — Progress Notes (Signed)
Patient is 3 months status post minimally displaced distal ulna fracture. He is doing well. He has no complaints. X-ray show healed fracture. He has excellent range of motion of the wrist. He has no pain. Patient has reached MMI. Activity as tolerated. Follow-up with me as needed.

## 2017-02-11 NOTE — Telephone Encounter (Signed)
Pt is showing a weight gain of 3.8lbs in 6 days.He have been suffering more with shornesst of breath in the last few days.Please call to evaluate.

## 2017-02-13 NOTE — Telephone Encounter (Signed)
Returned the phone call to [place] at [facility]. Left a message to call back.

## 2017-02-14 NOTE — Telephone Encounter (Signed)
Left message to call back with the patient, per DPR.  Per Patrici Ranks from [facility], the patient had an increase in weight by 3.8lbs in 6 days. His shortness of breath has worsened some to the point that he gets winded when ambulating from room to room. She did state that he will be getting his esophagus stretched next week and that GI said that this could cause shortness of breath(until the procedure has been done) Per Patrici Ranks, his weight has stabilized since the 10th. Message has been left with the patient to call back.

## 2017-02-14 NOTE — Telephone Encounter (Signed)
Spoke with pt, he thinks the weight gain is from his appetite returning and now he is eating better. He does continues to have SOB, he denies orthopnea or edema. He feels better today than he has in awhile. He will call with further problems.

## 2017-02-15 ENCOUNTER — Other Ambulatory Visit: Payer: Self-pay | Admitting: Family Medicine

## 2017-02-19 ENCOUNTER — Ambulatory Visit
Admission: RE | Admit: 2017-02-19 | Discharge: 2017-02-19 | Disposition: A | Discharge status: Home / Self Care | Payer: Medicare Other | Source: Ambulatory Visit | Attending: Family Medicine | Admitting: Family Medicine

## 2017-02-19 DIAGNOSIS — R1313 Dysphagia, pharyngeal phase: Secondary | ICD-10-CM | POA: Diagnosis not present

## 2017-02-19 DIAGNOSIS — R490 Dysphonia: Secondary | ICD-10-CM

## 2017-02-19 DIAGNOSIS — R1310 Dysphagia, unspecified: Secondary | ICD-10-CM

## 2017-02-19 DIAGNOSIS — R1312 Dysphagia, oropharyngeal phase: Secondary | ICD-10-CM

## 2017-02-19 NOTE — Therapy (Signed)
[place], [place], [address] Phone: [phone]   Fax:     Modified Barium Swallow  Patient Details  Name: [name] MRN: [id number] Date of Birth: [birth year] No Data Recorded  Encounter Date: 02/19/2017      End of Session - 02/19/17 1413    Visit Number 1   Number of Visits 1   Date for SLP Re-Evaluation 02/19/17   SLP Start Time 56   SLP Stop Time  1330   SLP Time Calculation (min) 60 min   Activity Tolerance Patient tolerated treatment well      Past Medical History:  Diagnosis Date  . Atelectasis of right lung   . Chronic diastolic (congestive) heart failure ([place])    a. 10/2013 Echo: EF 55-60%, basal-mid anteroseptal and basal-mid inferoseptal HK.  . Chronic fatigue   . Chronic subdural hematoma ([place])    a. 12/2015 Head CT: chronic right holo-hemispheric SDH w/o midline shift.  . Coronary artery disease    a. 11/2014 NSTEMI: DESx 2 placed to LAD and RCA; b. 08/2015 Cath: LAD patent stent, LCX 25m, RCA patent stent, 67m.  . Diabetes mellitus, type II ([place])    a. HgA1c 6.8 in 03/2015  . ED (erectile dysfunction)   . Gout   . Hemangioma of liver 08/02/2015   12 mm enhancing lesion seen on CT - suspicious for benign hemangioma but not diagnostic - MRI recommended  . HLD (hyperlipidemia)   . Hypertensive heart disease   . Kidney stones   . Lower extremity edema    a. chronic  . OA (osteoarthritis)   . Orthostatic hypotension   . PAF (paroxysmal atrial fibrillation) ([place])    a. 10/2015 s/p DCCV;  b. CHA2DS2VASc = 6--> on coumadin; c. 10/2015 Echo: sev dil LA.  Marland Kitchen Physical deconditioning   . Prostate cancer ([place])   . Pulmonary HTN ([place])   . Pulmonary hypertension ([place])    a. 10/2015 Echo: PASP 39mmHg.  Marland Kitchen Severe mitral regurgitation    a.  10/2015 s/p minimally invasive MV repair; b. 10/2015 Echo: mod MS, mean grad 35mmHg.  Marland Kitchen Spinal stenosis   . SVT (supraventricular tachycardia)  ([facility])    a. recurrent, usually responsive to vagal manuevers  . Tricuspid regurgitation    a. 10/2015 Echo: mild to mod TR.    Past Surgical History:  Procedure Laterality Date  . BACK SURGERY    . Back surgery x 2    . CARDIAC CATHETERIZATION N/A 08/08/2015   Procedure: Right/Left Heart Cath and Coronary Angiography;  Surgeon: Sherren Mocha, MD;  Location: [facility];  Service: Cardiovascular;  Laterality: N/A;  . CARDIAC SURGERY     2 cardiac stents  . CARDIOVERSION N/A 10/20/2015   Procedure: CARDIOVERSION;  Surgeon: Pixie Casino, MD;  Location: [facility];  Service: Cardiovascular;  Laterality: N/A;  . CERVICAL SPINE SURGERY    . CHOLECYSTECTOMY N/A 07/08/2016   Procedure: LAPAROSCOPIC CHOLECYSTECTOMY WITH INTRAOPERATIVE CHOLANGIOGRAM;  Surgeon: Georganna Skeans, MD;  Location: [facility];  Service: General;  Laterality: N/A;  . EYE SURGERY    . INTRAOPERATIVE TRANSESOPHAGEAL ECHOCARDIOGRAM  10/05/2015   Procedure: INTRAOPERATIVE TRANSESOPHAGEAL ECHOCARDIOGRAM;  Surgeon: Rexene Alberts, MD;  Location: [facility] OR;  Service: Open Heart Surgery;;  . JOINT REPLACEMENT     shoulder  . LEFT HEART CATHETERIZATION WITH CORONARY ANGIOGRAM N/A 11/16/2014   Procedure: LEFT HEART CATHETERIZATION WITH CORONARY ANGIOGRAM;  Surgeon: Troy Sine,  MD;  Location: [facility];  Service: Cardiovascular;  Laterality: N/A;  . MITRAL VALVE REPAIR Right 10/04/2015   Procedure: MINIMALLY INVASIVE MITRAL VALVE REPAIR (MVR);  Surgeon: Rexene Alberts, MD;  Location: [facility];  Service: Open Heart Surgery;  Laterality: Right;  . Rt rotator cuff repair    . TEE WITHOUT CARDIOVERSION N/A 06/22/2015   Procedure: TRANSESOPHAGEAL ECHOCARDIOGRAM (TEE);  Surgeon: Skeet Latch, MD;  Location: [facility];  Service: Cardiovascular;  Laterality: N/A;  . TEE WITHOUT CARDIOVERSION N/A 10/04/2015   Procedure: TRANSESOPHAGEAL ECHOCARDIOGRAM (TEE);  Surgeon: Rexene Alberts, MD;  Location: [facility];  Service: Open Heart Surgery;   Laterality: N/A;  . WOUND EXPLORATION N/A 10/05/2015   Procedure: Sternal Incision;  Surgeon: Rexene Alberts, MD;  Location: [facility];  Service: Open Heart Surgery;  Laterality: N/A;    There were no vitals filed for this visit.   Subjective: Patient behavior: (alertness, ability to follow instructions, etc.):  The patient is able to follow directions and verbalize complaints.  He is is difficulty to understand due to low volume, hoarse vocal quality and rapid rate of speech.  HPI: The patient is an 83 year man with complicated past medical history including cervical spine surgery (date unknown), chronic fatigue, pulmonary HTN, CAD, spinal stenosis, chronic diastolic (congestive) heart failure, chronic subdural hematoma, MBS 2017 (penetration and bony growth reducing diameter of pharynx), and barium swallow 2018 (penetration and barium tablet lodging in the valleculae).  The patient reports his primary concerns to be difficulty taking pills and morning accumulation of mucus in his throat.  The patient's wife reports additional concerns regarding his speech intelligibility and vocal quality.      Objective:  Radiological Procedure: A videoflouroscopic evaluation of oral-preparatory, reflex initiation, and pharyngeal phases of the swallow was performed; as well as a screening of the upper esophageal phase.  I. POSTURE: Upright in MBS chair  II. VIEW: Lateral  III. COMPENSATORY STRATEGIES: Spontaneous and requested throat clear expels penetrated material  IV. BOLUSES ADMINISTERED:   Thin Liquid: 2 cup rim sips   Nectar-thick Liquid: 1 cup rim sip   Honey-thick Liquid: DNT   Puree: 2 teaspoon presentations   Mechanical Soft: 1/4 graham cracker in applesauce   Barium tablet in applesauce  V. RESULTS OF EVALUATION: A. ORAL PREPARATORY PHASE: (The lips, tongue, and velum are observed for strength and coordination)       **Overall Severity Rating: Minimal; some slowing and disorganization  but adequate mastication and bolus manipulation  B. SWALLOW INITIATION/REFLEX: (The reflex is normal if "triggered" by the time the bolus reached the base of the tongue)  **Overall Severity Rating: Moderate; triggers while falling from the valleculae to the pyriform sinuses  C. PHARYNGEAL PHASE: (Pharyngeal function is normal if the bolus shows rapid, smooth, and continuous transit through the pharynx and there is no pharyngeal residue after the swallow)  **Overall Severity Rating: Moderate; reduced tongue base retraction, reduced hyolaryngeal excursion, absent epiglottic inversion (bony growth appears to preclude inversion), moderate vallecular residue.  D. LARYNGEAL PENETRATION: (Material entering into the laryngeal inlet/vestibule but not aspirated) Across consistencies; spontaneous throat clear with minimal laryngeal vestibule residue  E. ASPIRATION: None  F. ESOPHAGEAL PHASE: (Screening of the upper esophagus): unremarkable within the viewable cervical esophagus; patient is complaining of early morning mucus accumulation  ASSESSMENT: This 83 year old man; with complicated medical history and recent barium swallow study showing laryngeal penetration; is presenting with moderate oropharyngeal dysphagia characterized by slow/disorganized oral management, delayed  swallow initiation, decreased tongue base retraction, absent epiglottic inversion, moderate vallecular residue, and deep laryngeal penetration of pharyngeal residue.  The penetration occurs across all consistencies, typically of residue vs. as he swallows.  The patient demonstrated spontaneous and effective throat clearing in 80% of occurrences.   The patient was able to swallow a barium tablet embedded in moist puree (applesauce).  Although frank aspiration was not observed during this study, given the extent of pharyngeal residue across consistencies, the patient is at significant risk for at least trace chronic aspiration.  In view of the  patient's extensive history of dysphagia and known risk for laryngeal penetration, it is difficult to determine the patient's risk for negative sequelae to aspiration.   Recommend continuing the patient's current diet and swallowing recommendations.  The patient should be closely monitored for signs/symptoms of pneumonia and remain as physically active as possible.  It is doubtful that the patient has the stamina and endurance to benefit from a swallowing exercise routine.  The patient was counseled to try his medication in a bolus of applesauce.  In addition, the patient would benefit from referral to ENT secondary hoarseness.  PLAN/RECOMMENDATIONS:   A. Diet: Continue current diet, softer and moister foods may be easier to swallow   B. Swallowing Precautions: Continue to throat clear as needed- this is protective   C. Recommended consultation to ENT due to chronic hoarseness   D. Therapy recommendations: none at this time   E. Results and recommendations were discussed with the patient and his wife immediately following the study and the final report routed to the referring MD.  Patient will benefit from skilled therapeutic intervention in order to improve the following deficits and impairments:   Dysphonia  Oropharyngeal dysphagia - Plan: DG OP Swallowing Func-Medicare/Speech Path, DG OP Swallowing Func-Medicare/Speech Path  Dysphagia, unspecified type - Plan: SLP modified barium swallow, SLP modified barium swallow      G-Codes - 03/09/17 1414    Functional Assessment Tool Used MBSS, clinical judgment   Functional Limitations Swallowing   Swallow Current Status (D5329) At least 40 percent but less than 60 percent impaired, limited or restricted   Swallow Goal Status (J2426) At least 40 percent but less than 60 percent impaired, limited or restricted   Swallow Discharge Status [phone]) At least 40 percent but less than 60 percent impaired, limited or restricted          Problem  List Patient Active Problem List   Diagnosis Date Noted  . Closed fracture of lower end of right ulna 11/25/2016  . SDH (subdural hematoma) ([place]) 11/07/2016  . Subdural hematoma ([place]) 11/07/2016  . Chronic diastolic CHF (congestive heart failure) ([place]) 07/03/2016  . Gallstone pancreatitis 07/03/2016  . Hypertensive heart disease   . Syncope 12/11/2015  . Orthostatic hypotension   . Weakness 12/09/2015  . PAF (paroxysmal atrial fibrillation) ([place])   . CAD (coronary artery disease)   . Pressure ulcer 10/19/2015  . Long term (current) use of anticoagulants [Z79.01] 10/17/2015  . S/P minimally invasive mitral valve repair 10/04/2015  . Physical deconditioning   . Hemangioma of liver 08/02/2015  . Diabetes mellitus, type II ([place])   . Coronary artery disease   . Pulmonary HTN ([place])   . Lower extremity edema   . History of PSVT (paroxysmal supraventricular tachycardia) 11/15/2014  . Dyslipidemia 11/15/2014  . History of prostate cancer 11/15/2014  . Dry eye 09/03/2013  . TBI (traumatic brain injury) ([place]) 11/25/2012  . Orbit fracture ([place]) 11/25/2012  .  Maxillary sinus fracture ([place]) 11/25/2012  . Frequent falls 11/25/2012  . OA (osteoarthritis)   . Metabolic syndrome   . ED (erectile dysfunction)   . Gout   . Spinal stenosis   . Hypertension 05/07/2012   Leroy Sea, MS/CCC- SLP  Lou Miner 02/19/2017, 2:16 PM  [facility] [place], [place], [address] Phone: [phone]   Fax:     Name: [name] MRN: [id number] Date of Birth: May 01, 1929

## 2017-02-24 ENCOUNTER — Encounter: Payer: Self-pay | Admitting: Family Medicine

## 2017-02-24 ENCOUNTER — Ambulatory Visit (INDEPENDENT_AMBULATORY_CARE_PROVIDER_SITE_OTHER): Payer: Medicare Other | Admitting: Family Medicine

## 2017-02-24 VITALS — BP 120/60 | HR 64 | Temp 97.4°F | Resp 16 | Ht 67.0 in | Wt 200.0 lb

## 2017-02-24 DIAGNOSIS — I742 Embolism and thrombosis of arteries of the upper extremities: Secondary | ICD-10-CM

## 2017-02-24 DIAGNOSIS — L98492 Non-pressure chronic ulcer of skin of other sites with fat layer exposed: Secondary | ICD-10-CM

## 2017-02-24 NOTE — Progress Notes (Signed)
Subjective:    Patient ID: [name], male    DOB: 1928/09/24, 83 y.o.   MRN: [id number]  HPI Patient has a very complicated past medical history including coronary artery disease status post CABG as well as mitral valve replacement with a history of atrial fibrillation. Patient is not currently on any anticoagulation due to the fact he recently fell in April and suffered a subarachnoid hemorrhage. Because of the fall risk, he was deemed unsafe for chronic anticoagulation. Furthermore, the patient does not have an artificial valve. Patient states 1 week ago, he developed severe pain in the dorsum of his left hand suddenly. The following morning, a blister developed on his distal second finger tip on the finger pad. The blister ruptured and there is now a 1.5 cm ulcer on the tip of his left second digit down to the subcutaneous fat. Friday, 3 days ago, the patient develops similar pain in the dorsum of his right hand and the following morning, developed a blister on the tip of his right second digit on the finger pad. There is now a 1 cm ulcer down to the subcutaneous fat on the finger pad surface of the second digit on his right hand. Patient denies any injuries to the fingers. He denies any injury to the hand. This seems to represent some type of embolic infarction of the skin of the fingertips. Concern would be for cardiac source or possibly valve vegetation. Past Medical History:  Diagnosis Date  . Atelectasis of right lung   . Chronic diastolic (congestive) heart failure ([place])    a. 10/2013 Echo: EF 55-60%, basal-mid anteroseptal and basal-mid inferoseptal HK.  . Chronic fatigue   . Chronic subdural hematoma ([place])    a. 12/2015 Head CT: chronic right holo-hemispheric SDH w/o midline shift.  . Coronary artery disease    a. 11/2014 NSTEMI: DESx 2 placed to LAD and RCA; b. 08/2015 Cath: LAD patent stent, LCX 42m, RCA patent stent, 49m.  . Diabetes mellitus, type II ([place])    a. HgA1c 6.8 in  03/2015  . ED (erectile dysfunction)   . Gout   . Hemangioma of liver 08/02/2015   12 mm enhancing lesion seen on CT - suspicious for benign hemangioma but not diagnostic - MRI recommended  . HLD (hyperlipidemia)   . Hypertensive heart disease   . Kidney stones   . Lower extremity edema    a. chronic  . OA (osteoarthritis)   . Orthostatic hypotension   . PAF (paroxysmal atrial fibrillation) ([place])    a. 10/2015 s/p DCCV;  b. CHA2DS2VASc = 6--> on coumadin; c. 10/2015 Echo: sev dil LA.  Marland Kitchen Physical deconditioning   . Prostate cancer ([place])   . Pulmonary HTN ([place])   . Pulmonary hypertension ([place])    a. 10/2015 Echo: PASP 55mmHg.  Marland Kitchen Severe mitral regurgitation    a.  10/2015 s/p minimally invasive MV repair; b. 10/2015 Echo: mod MS, mean grad 57mmHg.  Marland Kitchen Spinal stenosis   . SVT (supraventricular tachycardia) (HCC)    a. recurrent, usually responsive to vagal manuevers  . Tricuspid regurgitation    a. 10/2015 Echo: mild to mod TR.   Past Surgical History:  Procedure Laterality Date  . BACK SURGERY    . Back surgery x 2    . CARDIAC CATHETERIZATION N/A 08/08/2015   Procedure: Right/Left Heart Cath and Coronary Angiography;  Surgeon: Sherren Mocha, MD;  Location: [place] [facility];  Service: Cardiovascular;  Laterality: N/A;  .  CARDIAC SURGERY     2 cardiac stents  . CARDIOVERSION N/A 10/20/2015   Procedure: CARDIOVERSION;  Surgeon: Pixie Casino, MD;  Location: [place];  Service: Cardiovascular;  Laterality: N/A;  . CERVICAL SPINE SURGERY    . CHOLECYSTECTOMY N/A 07/08/2016   Procedure: LAPAROSCOPIC CHOLECYSTECTOMY WITH INTRAOPERATIVE CHOLANGIOGRAM;  Surgeon: Georganna Skeans, MD;  Location: [facility];  Service: General;  Laterality: N/A;  . EYE SURGERY    . INTRAOPERATIVE TRANSESOPHAGEAL ECHOCARDIOGRAM  10/05/2015   Procedure: INTRAOPERATIVE TRANSESOPHAGEAL ECHOCARDIOGRAM;  Surgeon: Rexene Alberts, MD;  Location: [facility] OR;  Service: Open Heart Surgery;;  . JOINT REPLACEMENT     shoulder  .  LEFT HEART CATHETERIZATION WITH CORONARY ANGIOGRAM N/A 11/16/2014   Procedure: LEFT HEART CATHETERIZATION WITH CORONARY ANGIOGRAM;  Surgeon: Troy Sine, MD;  Location: [facility] CATH LAB;  Service: Cardiovascular;  Laterality: N/A;  . MITRAL VALVE REPAIR Right 10/04/2015   Procedure: MINIMALLY INVASIVE MITRAL VALVE REPAIR (MVR);  Surgeon: Rexene Alberts, MD;  Location: [place];  Service: Open Heart Surgery;  Laterality: Right;  . Rt rotator cuff repair    . TEE WITHOUT CARDIOVERSION N/A 06/22/2015   Procedure: TRANSESOPHAGEAL ECHOCARDIOGRAM (TEE);  Surgeon: Skeet Latch, MD;  Location: [place];  Service: Cardiovascular;  Laterality: N/A;  . TEE WITHOUT CARDIOVERSION N/A 10/04/2015   Procedure: TRANSESOPHAGEAL ECHOCARDIOGRAM (TEE);  Surgeon: Rexene Alberts, MD;  Location: [place];  Service: Open Heart Surgery;  Laterality: N/A;  . WOUND EXPLORATION N/A 10/05/2015   Procedure: Sternal Incision;  Surgeon: Rexene Alberts, MD;  Location: [place];  Service: Open Heart Surgery;  Laterality: N/A;   Current Outpatient Prescriptions on File Prior to Visit  Medication Sig Dispense Refill  . allopurinol (ZYLOPRIM) 100 MG tablet TAKE ONE (1) TABLET EACH DAY 90 tablet 3  . colchicine 0.6 MG tablet Take 1 tablet (0.6 mg total) by mouth daily. 30 tablet 5  . finasteride (PROSCAR) 5 MG tablet TAKE ONE (1) TABLET EACH DAY 30 tablet 11  . furosemide (LASIX) 40 MG tablet Take 1 tablet (40 mg total) by mouth 2 (two) times daily. 60 tablet 3  . magnesium oxide (MAG-OX) 400 MG tablet Take 1 tablet (400 mg total) by mouth daily. 30 tablet 0  . metoprolol tartrate (LOPRESSOR) 25 MG tablet TAKE ONE-HALF TABLET BY MOUTH TWICE DAILY 30 tablet 1  . OVER THE COUNTER MEDICATION Neuroquell: Take 1 capsule by mouth once a day for immune health    . potassium chloride (KLOR-CON) 20 MEQ packet Take 20 mEq by mouth daily. 30 packet 0  . pravastatin (PRAVACHOL) 40 MG tablet Take 1 tablet (40 mg total) by mouth daily at 6 PM. 30 tablet  11  . traMADol (ULTRAM) 50 MG tablet Take 1 tablet (50 mg total) by mouth every 6 (six) hours as needed for moderate pain. 30 tablet 0  . Wheat Dextrin (BENEFIBER) POWD Take by mouth See admin instructions. Mix one teaspoonful into 6-8 ounces of fluid/milk and drink once daily     No current facility-administered medications on file prior to visit.    No Known Allergies Social History   Social History  . Marital status: Married    Spouse name: N/A  . Number of children: 4  . Years of education: N/A   Occupational History  .  Retired   Social History Main Topics  . Smoking status: Former Smoker    Types: Cigarettes, Pipe, Landscape architect  . Smokeless tobacco: Former Systems developer    Types:  Chew     Comment: QUIT SMOKING  MANY YEARS AGO"  . Alcohol use No  . Drug use: No  . Sexual activity: Not on file   Other Topics Concern  . Not on file   Social History Narrative   ** Merged History Encounter **       Four adopted children.  Lives alone.       Review of Systems  All other systems reviewed and are negative.      Objective:   Physical Exam  Constitutional: He appears well-developed and well-nourished. No distress.  Cardiovascular: Normal rate and intact distal pulses.  An irregularly irregular rhythm present.  Murmur heard. Pulmonary/Chest: Effort normal and breath sounds normal. No respiratory distress. He has no wheezes. He has no rales. He exhibits no tenderness.  Abdominal: Soft. Bowel sounds are normal. He exhibits no distension. There is no tenderness. There is no rebound and no guarding.  Musculoskeletal:       Hands: Skin: No rash noted. He is not diaphoretic. No erythema.          Assessment & Plan:  Skin ulcer of finger with fat layer exposed ([place]) - Plan: ECHOCARDIOGRAM COMPLETE  Arterial embolism of arm (HCC) - Plan: ECHOCARDIOGRAM COMPLETE  The wounds were dressed with Silvadene, petroleum gauze, and wrapped in Coban. Recheck the wounds in 48 hours to assess  for healing. My biggest concern is arterial embolization from a cardiac source. Therefore I'll obtain a complete echocardiogram of the heart. Given his age, I will avoid TEE and start with a basic echocardiogram because of the need for sedation. If echocardiogram is negative for valvular vegetation or embolic source, I will consult cardiology for a second opinion and turn our focus on wound care. Patient may require arterial studies of the upper extremities and/or TEE.

## 2017-02-27 ENCOUNTER — Encounter: Payer: Self-pay | Admitting: Family Medicine

## 2017-02-27 ENCOUNTER — Ambulatory Visit (INDEPENDENT_AMBULATORY_CARE_PROVIDER_SITE_OTHER): Payer: Medicare Other | Admitting: Family Medicine

## 2017-02-27 VITALS — BP 100/60 | HR 60 | Temp 97.7°F | Resp 18 | Ht 67.0 in | Wt 199.0 lb

## 2017-02-27 DIAGNOSIS — L98492 Non-pressure chronic ulcer of skin of other sites with fat layer exposed: Secondary | ICD-10-CM | POA: Diagnosis not present

## 2017-02-27 NOTE — Progress Notes (Signed)
Subjective:    Patient ID: [name], male    DOB: May 11, 1929, 83 y.o.   MRN: [id number]  HPI  02/24/17 Patient has a very complicated past medical history including coronary artery disease status post CABG as well as mitral valve replacement with a history of atrial fibrillation. Patient is not currently on any anticoagulation due to the fact he recently fell in April and suffered a subarachnoid hemorrhage. Because of the fall risk, he was deemed unsafe for chronic anticoagulation. Furthermore, the patient does not have an artificial valve. Patient states 1 week ago, he developed severe pain in the dorsum of his left hand suddenly. The following morning, a blister developed on his distal second finger tip on the finger pad. The blister ruptured and there is now a 1.5 cm ulcer on the tip of his left second digit down to the subcutaneous fat. Friday, 3 days ago, the patient develops similar pain in the dorsum of his right hand and the following morning, developed a blister on the tip of his right second digit on the finger pad. There is now a 1 cm ulcer down to the subcutaneous fat on the finger pad surface of the second digit on his right hand. Patient denies any injuries to the fingers. He denies any injury to the hand. This seems to represent some type of embolic infarction of the skin of the fingertips. Concern would be for cardiac source or possibly valve vegetation.  At that time, my plan was: The wounds were dressed with Silvadene, petroleum gauze, and wrapped in Coban. Recheck the wounds in 48 hours to assess for healing. My biggest concern is arterial embolization from a cardiac source. Therefore I'll obtain a complete echocardiogram of the heart. Given his age, I will avoid TEE and start with a basic echocardiogram because of the need for sedation. If echocardiogram is negative for valvular vegetation or embolic source, I will consult cardiology for a second opinion and turn our focus on wound  care. Patient may require arterial studies of the upper extremities and/or TEE.  02/27/17 Today for recheck. Echocardiogram is scheduled for next week.  Wound on the tip of his right index finger is much better. It is now 3 mm x 16 mm. Wound on the tip of his left index finger is essentially unchanged and is 13 mm in diameter. Apparently the dressing fell off of this and the patient was unable to redress the wound. There is no evidence of cellulitis or infection. There is no drainage. Past Medical History:  Diagnosis Date  . Atelectasis of right lung   . Chronic diastolic (congestive) heart failure ([place])    a. 10/2013 Echo: EF 55-60%, basal-mid anteroseptal and basal-mid inferoseptal HK.  . Chronic fatigue   . Chronic subdural hematoma ([place])    a. 12/2015 Head CT: chronic right holo-hemispheric SDH w/o midline shift.  . Coronary artery disease    a. 11/2014 NSTEMI: DESx 2 placed to LAD and RCA; b. 08/2015 Cath: LAD patent stent, LCX 22m, RCA patent stent, 79m.  . Diabetes mellitus, type II ([place])    a. HgA1c 6.8 in 03/2015  . ED (erectile dysfunction)   . Gout   . Hemangioma of liver 08/02/2015   12 mm enhancing lesion seen on CT - suspicious for benign hemangioma but not diagnostic - MRI recommended  . HLD (hyperlipidemia)   . Hypertensive heart disease   . Kidney stones   . Lower extremity edema    a. chronic  .  OA (osteoarthritis)   . Orthostatic hypotension   . PAF (paroxysmal atrial fibrillation) ([place])    a. 10/2015 s/p DCCV;  b. CHA2DS2VASc = 6--> on coumadin; c. 10/2015 Echo: sev dil LA.  Marland Kitchen Physical deconditioning   . Prostate cancer ([place])   . Pulmonary HTN ([place])   . Pulmonary hypertension ([place])    a. 10/2015 Echo: PASP 70mmHg.  Marland Kitchen Severe mitral regurgitation    a.  10/2015 s/p minimally invasive MV repair; b. 10/2015 Echo: mod MS, mean grad 49mmHg.  Marland Kitchen Spinal stenosis   . SVT (supraventricular tachycardia) (HCC)    a. recurrent, usually responsive to vagal manuevers  . Tricuspid  regurgitation    a. 10/2015 Echo: mild to mod TR.   Past Surgical History:  Procedure Laterality Date  . BACK SURGERY    . Back surgery x 2    . CARDIAC CATHETERIZATION N/A 08/08/2015   Procedure: Right/Left Heart Cath and Coronary Angiography;  Surgeon: Sherren Mocha, MD;  Location: [facility];  Service: Cardiovascular;  Laterality: N/A;  . CARDIAC SURGERY     2 cardiac stents  . CARDIOVERSION N/A 10/20/2015   Procedure: CARDIOVERSION;  Surgeon: Pixie Casino, MD;  Location: [place];  Service: Cardiovascular;  Laterality: N/A;  . CERVICAL SPINE SURGERY    . CHOLECYSTECTOMY N/A 07/08/2016   Procedure: LAPAROSCOPIC CHOLECYSTECTOMY WITH INTRAOPERATIVE CHOLANGIOGRAM;  Surgeon: Georganna Skeans, MD;  Location: [place];  Service: General;  Laterality: N/A;  . EYE SURGERY    . INTRAOPERATIVE TRANSESOPHAGEAL ECHOCARDIOGRAM  10/05/2015   Procedure: INTRAOPERATIVE TRANSESOPHAGEAL ECHOCARDIOGRAM;  Surgeon: Rexene Alberts, MD;  Location: [facility];  Service: Open Heart Surgery;;  . JOINT REPLACEMENT     shoulder  . LEFT HEART CATHETERIZATION WITH CORONARY ANGIOGRAM N/A 11/16/2014   Procedure: LEFT HEART CATHETERIZATION WITH CORONARY ANGIOGRAM;  Surgeon: Troy Sine, MD;  Location[facility] C[facility] LAB;  Service: Cardiovascular;  Laterality: N/A;  . MITRAL VALVE REPAIR Right 10/04/2015   Procedure: MINIMALLY INVASIVE MITRAL VALVE REPAIR (MVR);  Surgeon: Rexene Alberts, MD;  Location: [place];  Service: Open Heart Surgery;  Laterality: Right;  . Rt rotator cuff repair    . TEE WITHOUT CARDIOVERSION N/A 06/22/2015   Procedure: TRANSESOPHAGEAL ECHOCARDIOGRAM (TEE);  Surgeon: Skeet Latch, MD;  Location: [place];  Service: Cardiovascular;  Laterality: N/A;  . TEE WITHOUT CARDIOVERSION N/A 10/04/2015   Procedure: TRANSESOPHAGEAL ECHOCARDIOGRAM (TEE);  Surgeon: Rexene Alberts, MD;  Location: [place];  Service: Open Heart Surgery;  Laterality: N/A;  . WOUND EXPLORATION N/A 10/05/2015   Procedure: Sternal  Incision;  Surgeon: Rexene Alberts, MD;  Location: [place];  Service: Open Heart Surgery;  Laterality: N/A;   Current Outpatient Prescriptions on File Prior to Visit  Medication Sig Dispense Refill  . allopurinol (ZYLOPRIM) 100 MG tablet TAKE ONE (1) TABLET EACH DAY 90 tablet 3  . colchicine 0.6 MG tablet Take 1 tablet (0.6 mg total) by mouth daily. 30 tablet 5  . finasteride (PROSCAR) 5 MG tablet TAKE ONE (1) TABLET EACH DAY 30 tablet 11  . furosemide (LASIX) 40 MG tablet Take 1 tablet (40 mg total) by mouth 2 (two) times daily. 60 tablet 3  . magnesium oxide (MAG-OX) 400 MG tablet Take 1 tablet (400 mg total) by mouth daily. 30 tablet 0  . metoprolol tartrate (LOPRESSOR) 25 MG tablet TAKE ONE-HALF TABLET BY MOUTH TWICE DAILY 30 tablet 1  . OVER THE COUNTER MEDICATION Neuroquell: Take 1 capsule by mouth once a  day for immune health    . potassium chloride (KLOR-CON) 20 MEQ packet Take 20 mEq by mouth daily. 30 packet 0  . pravastatin (PRAVACHOL) 40 MG tablet Take 1 tablet (40 mg total) by mouth daily at 6 PM. 30 tablet 11  . traMADol (ULTRAM) 50 MG tablet Take 1 tablet (50 mg total) by mouth every 6 (six) hours as needed for moderate pain. 30 tablet 0  . Wheat Dextrin (BENEFIBER) POWD Take by mouth See admin instructions. Mix one teaspoonful into 6-8 ounces of fluid/milk and drink once daily     No current facility-administered medications on file prior to visit.    No Known Allergies Social History   Social History  . Marital status: Married    Spouse name: N/A  . Number of children: 4  . Years of education: N/A   Occupational History  .  Retired   Social History Main Topics  . Smoking status: Former Smoker    Types: Cigarettes, Pipe, Landscape architect  . Smokeless tobacco: Former Systems developer    Types: Chew     Comment: Kathryn YEARS AGO"  . Alcohol use No  . Drug use: No  . Sexual activity: Not on file   Other Topics Concern  . Not on file   Social History Narrative   ** Merged  History Encounter **       Four adopted children.  Lives alone.       Review of Systems  All other systems reviewed and are negative.      Objective:   Physical Exam  Constitutional: He appears well-developed and well-nourished. No distress.  Cardiovascular: Normal rate and intact distal pulses.  An irregularly irregular rhythm present.  Murmur heard. Pulmonary/Chest: Effort normal and breath sounds normal. No respiratory distress. He has no wheezes. He has no rales. He exhibits no tenderness.  Abdominal: Soft. Bowel sounds are normal. He exhibits no distension. There is no tenderness. There is no rebound and no guarding.  Musculoskeletal:       Hands: Skin: No rash noted. He is not diaphoretic. No erythema.          Assessment & Plan:  Skin ulcer of finger with fat layer exposed ([place])  The wound on the tip of his right index finger is healing nicely. I again cover the wound with Silvadene, petroleum gauze and wrapped in Coban.  I suspect that the wound on the tip of his left index finger is slow to heal because the dressing came off. Therefore I reapplied the dressing coating the wound with Silvadene, wrapping it with petroleum gauze, and then wrapping the entire finger in Coban and. Recheck the wounds on Monday.

## 2017-03-03 ENCOUNTER — Other Ambulatory Visit: Payer: Self-pay

## 2017-03-03 ENCOUNTER — Ambulatory Visit (INDEPENDENT_AMBULATORY_CARE_PROVIDER_SITE_OTHER): Payer: Medicare Other | Admitting: Family Medicine

## 2017-03-03 ENCOUNTER — Ambulatory Visit (HOSPITAL_COMMUNITY): Payer: Medicare Other | Attending: Internal Medicine

## 2017-03-03 VITALS — BP 100/60 | HR 62 | Temp 97.6°F | Resp 16 | Ht 67.0 in | Wt 198.0 lb

## 2017-03-03 DIAGNOSIS — I10 Essential (primary) hypertension: Secondary | ICD-10-CM | POA: Insufficient documentation

## 2017-03-03 DIAGNOSIS — I742 Embolism and thrombosis of arteries of the upper extremities: Secondary | ICD-10-CM | POA: Diagnosis not present

## 2017-03-03 DIAGNOSIS — L98492 Non-pressure chronic ulcer of skin of other sites with fat layer exposed: Secondary | ICD-10-CM

## 2017-03-03 DIAGNOSIS — I071 Rheumatic tricuspid insufficiency: Secondary | ICD-10-CM | POA: Diagnosis not present

## 2017-03-03 DIAGNOSIS — Z951 Presence of aortocoronary bypass graft: Secondary | ICD-10-CM | POA: Diagnosis not present

## 2017-03-03 DIAGNOSIS — I4891 Unspecified atrial fibrillation: Secondary | ICD-10-CM | POA: Diagnosis not present

## 2017-03-03 DIAGNOSIS — E11622 Type 2 diabetes mellitus with other skin ulcer: Secondary | ICD-10-CM | POA: Diagnosis not present

## 2017-03-03 DIAGNOSIS — E785 Hyperlipidemia, unspecified: Secondary | ICD-10-CM | POA: Diagnosis not present

## 2017-03-03 DIAGNOSIS — I2510 Atherosclerotic heart disease of native coronary artery without angina pectoris: Secondary | ICD-10-CM | POA: Diagnosis not present

## 2017-03-03 LAB — ECHOCARDIOGRAM COMPLETE
HEIGHTINCHES: 67 in
WEIGHTICAEL: 3168 [oz_av]

## 2017-03-03 NOTE — Progress Notes (Signed)
Subjective:    Patient ID: [name], male    DOB: Nov 25, 1928, 83 y.o.   MRN: [id number]  HPI  02/24/17 Patient has a very complicated past medical history including coronary artery disease status post CABG as well as mitral valve replacement with a history of atrial fibrillation. Patient is not currently on any anticoagulation due to the fact he recently fell in April and suffered a subarachnoid hemorrhage. Because of the fall risk, he was deemed unsafe for chronic anticoagulation. Furthermore, the patient does not have an artificial valve. Patient states 1 week ago, he developed severe pain in the dorsum of his left hand suddenly. The following morning, a blister developed on his distal second finger tip on the finger pad. The blister ruptured and there is now a 1.5 cm ulcer on the tip of his left second digit down to the subcutaneous fat. Friday, 3 days ago, the patient develops similar pain in the dorsum of his right hand and the following morning, developed a blister on the tip of his right second digit on the finger pad. There is now a 1 cm ulcer down to the subcutaneous fat on the finger pad surface of the second digit on his right hand. Patient denies any injuries to the fingers. He denies any injury to the hand. This seems to represent some type of embolic infarction of the skin of the fingertips. Concern would be for cardiac source or possibly valve vegetation.  At that time, my plan was: The wounds were dressed with Silvadene, petroleum gauze, and wrapped in Coban. Recheck the wounds in 48 hours to assess for healing. My biggest concern is arterial embolization from a cardiac source. Therefore I'll obtain a complete echocardiogram of the heart. Given his age, I will avoid TEE and start with a basic echocardiogram because of the need for sedation. If echocardiogram is negative for valvular vegetation or embolic source, I will consult cardiology for a second opinion and turn our focus on wound  care. Patient may require arterial studies of the upper extremities and/or TEE.  02/27/17 Today for recheck. Echocardiogram is scheduled for next week.  Wound on the tip of his right index finger is much better. It is now 3 mm x 16 mm. Wound on the tip of his left index finger is essentially unchanged and is 13 mm in diameter. Apparently the dressing fell off of this and the patient was unable to redress the wound. There is no evidence of cellulitis or infection. There is no drainage.  At that time, my plan was: The wound on the tip of his right index finger is healing nicely. I again cover the wound with Silvadene, petroleum gauze and wrapped in Coban.  I suspect that the wound on the tip of his left index finger is slow to heal because the dressing came off. Therefore I reapplied the dressing coating the wound with Silvadene, wrapping it with petroleum gauze, and then wrapping the entire finger in Coban and. Recheck the wounds on Monday.  03/03/17 Wound on the tip of the right index finger has essentially healed. The wound on the tip of the left index finger is much better and is now 8 mm in diameter. It is slowly healing but there is no evidence of cellulitis. There is no drainage. His echocardiogram is scheduled for today. Past Medical History:  Diagnosis Date  . Atelectasis of right lung   . Chronic diastolic (congestive) heart failure ([employer])    a. 10/2013 Echo:  EF 55-60%, basal-mid anteroseptal and basal-mid inferoseptal HK.  . Chronic fatigue   . Chronic subdural hematoma ([place])    a. 12/2015 Head CT: chronic right holo-hemispheric SDH w/o midline shift.  . Coronary artery disease    a. 11/2014 NSTEMI: DESx 2 placed to LAD and RCA; b. 08/2015 Cath: LAD patent stent, LCX 47m, RCA patent stent, 24m.  . Diabetes mellitus, type II ([place])    a. HgA1c 6.8 in 03/2015  . ED (erectile dysfunction)   . Gout   . Hemangioma of liver 08/02/2015   12 mm enhancing lesion seen on CT - suspicious for benign  hemangioma but not diagnostic - MRI recommended  . HLD (hyperlipidemia)   . Hypertensive heart disease   . Kidney stones   . Lower extremity edema    a. chronic  . OA (osteoarthritis)   . Orthostatic hypotension   . PAF (paroxysmal atrial fibrillation) ([place])    a. 10/2015 s/p DCCV;  b. CHA2DS2VASc = 6--> on coumadin; c. 10/2015 Echo: sev dil LA.  Marland Kitchen Physical deconditioning   . Prostate cancer ([place])   . Pulmonary HTN ([place])   . Pulmonary hypertension ([place])    a. 10/2015 Echo: PASP 17mmHg.  Marland Kitchen Severe mitral regurgitation    a.  10/2015 s/p minimally invasive MV repair; b. 10/2015 Echo: mod MS, mean grad 89mmHg.  Marland Kitchen Spinal stenosis   . SVT (supraventricular tachycardia) (H[facility])    a. recurrent, usually responsive to vagal manuevers  . Tricuspid regurgitation    a. 10/2015 Echo: mild to mod TR.   Past Surgical History:  Procedure Laterality Date  . BACK SURGERY    . Back surgery x 2    . CARDIAC CATHETERIZATION N/A 08/08/2015   Procedure: Right/Left Heart Cath and Coronary Angiography;  Surgeon: Sherren Mocha, MD;  Location: [place] [facility];  Service: Cardiovascular;  Laterality: N/A;  . CARDIAC SURGERY     2 cardiac stents  . CARDIOVERSION N/A 10/20/2015   Procedure: CARDIOVERSION;  Surgeon: Pixie Casino, MD;  Location: [facility];  Service: Cardiovascular;  Laterality: N/A;  . CERVICAL SPINE SURGERY    . CHOLECYSTECTOMY N/A 07/08/2016   Procedure: LAPAROSCOPIC CHOLECYSTECTOMY WITH INTRAOPERATIVE CHOLANGIOGRAM;  Surgeon: Georganna Skeans, MD;  Location: [facility];  Service: General;  Laterality: N/A;  . EYE SURGERY    . INTRAOPERATIVE TRANSESOPHAGEAL ECHOCARDIOGRAM  10/05/2015   Procedure: INTRAOPERATIVE TRANSESOPHAGEAL ECHOCARDIOGRAM;  Surgeon: Rexene Alberts, MD;  Location: [facility] OR;  Service: Open Heart Surgery;;  . JOINT REPLACEMENT     shoulder  . LEFT HEART CATHETERIZATION WITH CORONARY ANGIOGRAM N/A 11/16/2014   Procedure: LEFT HEART CATHETERIZATION WITH CORONARY ANGIOGRAM;  Surgeon:  Troy Sine, MD;  Location: [facility] CATH LAB;  Service: Cardiovascular;  Laterality: N/A;  . MITRAL VALVE REPAIR Right 10/04/2015   Procedure: MINIMALLY INVASIVE MITRAL VALVE REPAIR (MVR);  Surgeon: Rexene Alberts, MD;  Location: [facility];  Service: Open Heart Surgery;  Laterality: Right;  . Rt rotator cuff repair    . TEE WITHOUT CARDIOVERSION N/A 06/22/2015   Procedure: TRANSESOPHAGEAL ECHOCARDIOGRAM (TEE);  Surgeon: Skeet Latch, MD;  Location: [place];  Service: Cardiovascular;  Laterality: N/A;  . TEE WITHOUT CARDIOVERSION N/A 10/04/2015   Procedure: TRANSESOPHAGEAL ECHOCARDIOGRAM (TEE);  Surgeon: Rexene Alberts, MD;  Location: [place];  Service: Open Heart Surgery;  Laterality: N/A;  . WOUND EXPLORATION N/A 10/05/2015   Procedure: Sternal Incision;  Surgeon: Rexene Alberts, MD;  Location: [place];  Service: Open Heart Surgery;  Laterality: N/A;   Current Outpatient Prescriptions on File Prior to Visit  Medication Sig Dispense Refill  . allopurinol (ZYLOPRIM) 100 MG tablet TAKE ONE (1) TABLET EACH DAY 90 tablet 3  . colchicine 0.6 MG tablet Take 1 tablet (0.6 mg total) by mouth daily. 30 tablet 5  . finasteride (PROSCAR) 5 MG tablet TAKE ONE (1) TABLET EACH DAY 30 tablet 11  . furosemide (LASIX) 40 MG tablet Take 1 tablet (40 mg total) by mouth 2 (two) times daily. 60 tablet 3  . magnesium oxide (MAG-OX) 400 MG tablet Take 1 tablet (400 mg total) by mouth daily. 30 tablet 0  . metoprolol tartrate (LOPRESSOR) 25 MG tablet TAKE ONE-HALF TABLET BY MOUTH TWICE DAILY 30 tablet 1  . OVER THE COUNTER MEDICATION Neuroquell: Take 1 capsule by mouth once a day for immune health    . potassium chloride (KLOR-CON) 20 MEQ packet Take 20 mEq by mouth daily. 30 packet 0  . pravastatin (PRAVACHOL) 40 MG tablet Take 1 tablet (40 mg total) by mouth daily at 6 PM. 30 tablet 11  . traMADol (ULTRAM) 50 MG tablet Take 1 tablet (50 mg total) by mouth every 6 (six) hours as needed for moderate pain. 30 tablet 0  .  Wheat Dextrin (BENEFIBER) POWD Take by mouth See admin instructions. Mix one teaspoonful into 6-8 ounces of fluid/milk and drink once daily     No current facility-administered medications on file prior to visit.    No Known Allergies Social History   Social History  . Marital status: Married    Spouse name: N/A  . Number of children: 4  . Years of education: N/A   Occupational History  .  Retired   Social History Main Topics  . Smoking status: Former Smoker    Types: Cigarettes, Pipe, Landscape architect  . Smokeless tobacco: Former Systems developer    Types: Chew     Comment: [place] YEARS AGO"  . Alcohol use No  . Drug use: No  . Sexual activity: Not on file   Other Topics Concern  . Not on file   Social History Narrative   ** Merged History Encounter **       Four adopted children.  Lives alone.       Review of Systems  All other systems reviewed and are negative.      Objective:   Physical Exam  Constitutional: He appears well-developed and well-nourished. No distress.  Cardiovascular: Normal rate and intact distal pulses.  An irregularly irregular rhythm present.  Murmur heard. Pulmonary/Chest: Effort normal and breath sounds normal. No respiratory distress. He has no wheezes. He has no rales. He exhibits no tenderness.  Abdominal: Soft. Bowel sounds are normal. He exhibits no distension. There is no tenderness. There is no rebound and no guarding.  Musculoskeletal:       Hands: Skin: No rash noted. He is not diaphoretic. No erythema.          Assessment & Plan:  Skin ulcer of finger with fat layer exposed ([place])  Wounds are healing nicely. I recommended that he leave the wound on his right hand open to the air to allow the skin to toughen up and dry out but there is no residual ulcer at this point. I covered the tip of his left index pain with petroleum gauze and wrapped with Coban and a Band-Aid. Recheck this in one week but it is healing nicely. Because the skin  is healing so well,  I believe that arterial embolization is highly unlikely at this point. He has an echocardiogram today to assess for any signs of endocarditis. If echocardiogram is negative and wounds continue to heal, no further follow-up is necessary for this issue. However I'm not sure how the ulcers developed initially

## 2017-03-10 ENCOUNTER — Encounter: Payer: Self-pay | Admitting: Family Medicine

## 2017-03-10 ENCOUNTER — Telehealth: Payer: Self-pay | Admitting: Cardiology

## 2017-03-10 ENCOUNTER — Ambulatory Visit (INDEPENDENT_AMBULATORY_CARE_PROVIDER_SITE_OTHER): Payer: Medicare Other | Admitting: Family Medicine

## 2017-03-10 VITALS — BP 100/60 | HR 62 | Temp 97.9°F | Resp 16 | Ht 67.0 in | Wt 200.0 lb

## 2017-03-10 DIAGNOSIS — I749 Embolism and thrombosis of unspecified artery: Secondary | ICD-10-CM

## 2017-03-10 MED ORDER — WARFARIN SODIUM 5 MG PO TABS: 5.0000 mg | ORAL_TABLET | Freq: Every day | ORAL | 3 refills | Status: DC

## 2017-03-10 NOTE — Telephone Encounter (Signed)
Dr. Dennard Schaumann calling with [facility] and is requesting patient be seen soon.

## 2017-03-10 NOTE — Telephone Encounter (Signed)
Spoke with Dr. Dennard Schaumann about patient who is seeing him for ulcers on his fingertips, worsening to the point he may need an amputation. He states patient has had an [place] procedure w/Dr. Roxy Manns in the past. Dr. Dennard Schaumann is worried about possible arterial embolization and ordered an echo which looked OK but he thinks a TEE would be the best evaluation. He would like the patient to be seen to set this up. Dr. Dennard Schaumann has reached out to Dr. Trula Slade who recommended he see Dr. Roxy Manns who is out of the office all week. Spoke with Hebert Soho and it is OK to bring patient in on 8/7 @ 1:20pm to see MD. Patient scheduled.

## 2017-03-10 NOTE — Progress Notes (Signed)
HPI  The patient for evaluation of ulcerations on his fingertips.     He has HeFPEF, CAD, narrow complex tachycardia MR and a host of other medical problems as listed below.  He is status post MV repair in 10/2015.  His immediate post op course was complicated by hypotension inducible SAM and MR.  This improved with medication adjustment including adding a beta blocker.  Post op he had atrial fib (requiring DCCV and amiodarone), acute on chronic diastolic HF, syncope with orthostasis.  We were called yesterday to see him because of ulcers that have developed on his fingertips.    He reports that 3 weeks ago he developed numbness and cold sensation on his left hand on first his index finger and in his middle finger. He developed some ulceration with bleeding. This discoloration. The same thing happened to his right hand 1 week ago on the same fingers. Of note he's been off of his anticoagulation since he had a fall with subarachnoid hemorrhage in April of this year. He otherwise is not complaining of any acute events. He's had no visual speech or motor changes. He's had no chest pressure, neck or arm discomfort. He's had no fevers or chills. He's had no pain associated with these lesions. There is no shortness of breath, PND or orthopnea. He saw his primary provider and had an echocardiogram with results as below which demonstrated no acute abnormalities. He was restarted on his anticoagulation with warfarin.   No Known Allergies  Current Outpatient Prescriptions  Medication Sig Dispense Refill  . allopurinol (ZYLOPRIM) 100 MG tablet TAKE ONE (1) TABLET EACH DAY 90 tablet 3  . colchicine 0.6 MG tablet Take 1 tablet (0.6 mg total) by mouth daily. 30 tablet 5  . finasteride (PROSCAR) 5 MG tablet TAKE ONE (1) TABLET EACH DAY 30 tablet 11  . furosemide (LASIX) 40 MG tablet Take 1 tablet (40 mg total) by mouth 2 (two) times daily. 60 tablet 3  . magnesium oxide (MAG-OX) 400 MG tablet Take 1 tablet (400  mg total) by mouth daily. 30 tablet 0  . metoprolol tartrate (LOPRESSOR) 25 MG tablet TAKE ONE-HALF TABLET BY MOUTH TWICE DAILY 30 tablet 1  . OVER THE COUNTER MEDICATION Neuroquell: Take 1 capsule by mouth once a day for immune health    . potassium chloride (KLOR-CON) 20 MEQ packet Take 20 mEq by mouth daily. 30 packet 0  . pravastatin (PRAVACHOL) 40 MG tablet Take 1 tablet (40 mg total) by mouth daily at 6 PM. 30 tablet 11  . traMADol (ULTRAM) 50 MG tablet Take 1 tablet (50 mg total) by mouth every 6 (six) hours as needed for moderate pain. 30 tablet 0  . warfarin (COUMADIN) 5 MG tablet Take 1 tablet (5 mg total) by mouth daily. 30 tablet 3  . Wheat Dextrin (BENEFIBER) POWD Take by mouth See admin instructions. Mix one teaspoonful into 6-8 ounces of fluid/milk and drink once daily     No current facility-administered medications for this visit.     Past Medical History:  Diagnosis Date  . Atelectasis of right lung   . Chronic diastolic (congestive) heart failure ([place])    a. 10/2013 Echo: EF 55-60%, basal-mid anteroseptal and basal-mid inferoseptal HK.  . Chronic fatigue   . Chronic subdural hematoma ([place])    a. 12/2015 Head CT: chronic right holo-hemispheric SDH w/o midline shift.  . Coronary artery disease    a. 11/2014 NSTEMI: DESx 2 placed to LAD and RCA; b.  08/2015 Cath: LAD patent stent, LCX 46m, RCA patent stent, 36m.  . Diabetes mellitus, type II ([place])    a. HgA1c 6.8 in 03/2015  . ED (erectile dysfunction)   . Gout   . Hemangioma of liver 08/02/2015   12 mm enhancing lesion seen on CT - suspicious for benign hemangioma but not diagnostic - MRI recommended  . HLD (hyperlipidemia)   . Hypertensive heart disease   . Kidney stones   . Lower extremity edema    a. chronic  . OA (osteoarthritis)   . Orthostatic hypotension   . PAF (paroxysmal atrial fibrillation) ([place])    a. 10/2015 s/p DCCV;  b. CHA2DS2VASc = 6--> on coumadin; c. 10/2015 Echo: sev dil LA.  Marland Kitchen Physical deconditioning    . Prostate cancer ([place])   . Pulmonary HTN ([place])   . Pulmonary hypertension ([place])    a. 10/2015 Echo: PASP 87mmHg.  Marland Kitchen Severe mitral regurgitation    a.  10/2015 s/p minimally invasive MV repair; b. 10/2015 Echo: mod MS, mean grad 75mmHg.  Marland Kitchen Spinal stenosis   . SVT (supraventricular tachycardia) ([facility])    a. recurrent, usually responsive to vagal manuevers  . Tricuspid regurgitation    a. 10/2015 Echo: mild to mod TR.    Past Surgical History:  Procedure Laterality Date  . BACK SURGERY    . Back surgery x 2    . CARDIAC CATHETERIZATION N/A 08/08/2015   Procedure: Right/Left Heart Cath and Coronary Angiography;  Surgeon: Sherren Mocha, MD;  Location: [facility] [facility];  Service: Cardiovascular;  Laterality: N/A;  . CARDIAC SURGERY     2 cardiac stents  . CARDIOVERSION N/A 10/20/2015   Procedure: CARDIOVERSION;  Surgeon: Pixie Casino, MD;  Location: [facility];  Service: Cardiovascular;  Laterality: N/A;  . CERVICAL SPINE SURGERY    . CHOLECYSTECTOMY N/A 07/08/2016   Procedure: LAPAROSCOPIC CHOLECYSTECTOMY WITH INTRAOPERATIVE CHOLANGIOGRAM;  Surgeon: Georganna Skeans, MD;  Location: [place] [facility];  Service: General;  Laterality: N/A;  . EYE SURGERY    . INTRAOPERATIVE TRANSESOPHAGEAL ECHOCARDIOGRAM  10/05/2015   Procedure: INTRAOPERATIVE TRANSESOPHAGEAL ECHOCARDIOGRAM;  Surgeon: Rexene Alberts, MD;  Location: [facility] OR;  Service: Open Heart Surgery;;  . JOINT REPLACEMENT     shoulder  . LEFT HEART CATHETERIZATION WITH CORONARY ANGIOGRAM N/A 11/16/2014   Procedure: LEFT HEART CATHETERIZATION WITH CORONARY ANGIOGRAM;  Surgeon: Troy Sine, MD;  Location: [facility] CATH LAB;  Service: Cardiovascular;  Laterality: N/A;  . MITRAL VALVE REPAIR Right 10/04/2015   Procedure: MINIMALLY INVASIVE MITRAL VALVE REPAIR (MVR);  Surgeon: Rexene Alberts, MD;  Location: [facility];  Service: Open Heart Surgery;  Laterality: Right;  . Rt rotator cuff repair    . TEE WITHOUT CARDIOVERSION N/A 06/22/2015   Procedure:  TRANSESOPHAGEAL ECHOCARDIOGRAM (TEE);  Surgeon: Skeet Latch, MD;  Location: [facility] [place];  Service: Cardiovascular;  Laterality: N/A;  . TEE WITHOUT CARDIOVERSION N/A 10/04/2015   Procedure: TRANSESOPHAGEAL ECHOCARDIOGRAM (TEE);  Surgeon: Rexene Alberts, MD;  Location: [facility];  Service: Open Heart Surgery;  Laterality: N/A;  . WOUND EXPLORATION N/A 10/05/2015   Procedure: Sternal Incision;  Surgeon: Rexene Alberts, MD;  Location: [place];  Service: Open Heart Surgery;  Laterality: N/A;     ROS:   As stated in the HPI and negative for all other systems.    PHYSICAL EXAM BP 127/73   Pulse 63   Ht 5\' 7"  (1.702 m)   Wt 194 lb (88 kg)   BMI 30.38 kg/m  GENERAL:  Well appearing NECK:  No jugular venous distention, waveform within normal limits, carotid upstroke brisk and symmetric, no bruits, no thyromegaly LUNGS:  Clear to auscultation bilaterally CHEST:   Well healed surgical scar. HEART:  PMI not displaced or sustained,S1 and S2 within normal limits, no S3, no S4, no clicks, no rubs, no murmurs ABD:  Flat, positive bowel sounds normal in frequency in pitch, no bruits, no rebound, no guarding, no midline pulsatile mass, no hepatomegaly, no splenomegaly EXT:  2 plus radial pulses, mildly decreased bilateral dorsalis pedis and posterior tibialis with mild edema, ulceration of the right and left index finger and right and left middle finger with coolness  NEURO:  Nonfocal.   EKG:  Sinus rhythm, rate 63, left axis deviation, probable old inferior infarct, poor anterior R wave progression, no acute ST-T wave changes.  Lab Results  Component Value Date   CHOL 158 06/05/2015   TRIG 235 (H) 06/05/2015   HDL 31 (L) 06/05/2015   LDLCALC 80 06/05/2015   Lab Results  Component Value Date   HGBA1C 5.7 (H) 12/08/2015   ECHO:  - Left ventricle: The cavity size was mildly reduced. Wall   thickness was increased in a pattern of severe LVH. Systolic   function was normal. The estimated ejection  fraction was in the   range of 60% to 65%. Doppler parameters are consistent with a   reversible restrictive pattern, indicative of decreased left   ventricular diastolic compliance and/or increased left atrial   pressure (grade 3 diastolic dysfunction). Doppler parameters are   consistent with high ventricular filling pressure. - Aortic valve: AV is thickened, calcified with moderately   restricted motion. Peak and mean gradients through the valve are   18 and 9 mm Hg respectively. - Mitral valve: Moderately calcified annulus. - Left atrium: The atrium was moderately to severely dilated. - Right atrium: The atrium was moderately dilated. - Tricuspid valve: There was moderate regurgitation. - Pulmonary arteries: PA peak pressure: 39 mm Hg (S).  ASSESSMENT AND PLAN  Digital Ulcers:   I suspect this might be embolic phenomenon and he is now back on anticoagulation. I don't think that further imaging for revascularization with angiography or other is indicated in that he has good radial pulses and this appears to be a distal phenomenon.  He has had no symptoms consistent with a vasospastic or vasculitic process.  He seems to be resolving without new lesions.    Mitral valve repair:   He has a stable valve repair from the echo above. No change in therapy is indicated.  Atrial fib:      Mr. MYKEL SPONAUGLE has a CHA2DS2 - VASc score of 5 with a risk of stroke of 6.7%.   He will continue with anticoagulation.  SVT-    He's had no symptomatic recurrence of this. No change in therapy is indicated.  HFpEF/severe LVH/mod MR/pHTN (RVSP 38mmHg) -   he seems to be euvolemic. He will continue on the meds as listed.  CAD-    He has no ischemic symptoms. He will continue with risk reduction.  DM-    hemoglobin A1c was 5.7 as above.  I will defer to [name], MD  HLD-   LDL 80. Goal <70 with CAD. We placed him on statin. He needs a repeat fasting lipid and liver.

## 2017-03-10 NOTE — Progress Notes (Signed)
Subjective:    Patient ID: [name], male    DOB: 06/20/1929, 83 y.o.   MRN: [id number]  HPI  02/24/17 Patient has a very complicated past medical history including coronary artery disease status post CABG as well as mitral valve replacement with a history of atrial fibrillation. Patient is not currently on any anticoagulation due to the fact he recently fell in April and suffered a subarachnoid hemorrhage. Because of the fall risk, he was deemed unsafe for chronic anticoagulation. Furthermore, the patient does not have an artificial valve. Patient states 1 week ago, he developed severe pain in the dorsum of his left hand suddenly. The following morning, a blister developed on his distal second finger tip on the finger pad. The blister ruptured and there is now a 1.5 cm ulcer on the tip of his left second digit down to the subcutaneous fat. Friday, 3 days ago, the patient develops similar pain in the dorsum of his right hand and the following morning, developed a blister on the tip of his right second digit on the finger pad. There is now a 1 cm ulcer down to the subcutaneous fat on the finger pad surface of the second digit on his right hand. Patient denies any injuries to the fingers. He denies any injury to the hand. This seems to represent some type of embolic infarction of the skin of the fingertips. Concern would be for cardiac source or possibly valve vegetation.  At that time, my plan was: The wounds were dressed with Silvadene, petroleum gauze, and wrapped in Coban. Recheck the wounds in 48 hours to assess for healing. My biggest concern is arterial embolization from a cardiac source. Therefore I'll obtain a complete echocardiogram of the heart. Given his age, I will avoid TEE and start with a basic echocardiogram because of the need for sedation. If echocardiogram is negative for valvular vegetation or embolic source, I will consult cardiology for a second opinion and turn our focus on wound  care. Patient may require arterial studies of the upper extremities and/or TEE.  02/27/17 Today for recheck. Echocardiogram is scheduled for next week.  Wound on the tip of his right index finger is much better. It is now 3 mm x 16 mm. Wound on the tip of his left index finger is essentially unchanged and is 13 mm in diameter. Apparently the dressing fell off of this and the patient was unable to redress the wound. There is no evidence of cellulitis or infection. There is no drainage.  At that time, my plan was: The wound on the tip of his right index finger is healing nicely. I again cover the wound with Silvadene, petroleum gauze and wrapped in Coban.  I suspect that the wound on the tip of his left index finger is slow to heal because the dressing came off. Therefore I reapplied the dressing coating the wound with Silvadene, wrapping it with petroleum gauze, and then wrapping the entire finger in Coban and. Recheck the wounds on Monday.  03/03/17 Wound on the tip of the right index finger has essentially healed. The wound on the tip of the left index finger is much better and is now 8 mm in diameter. It is slowly healing but there is no evidence of cellulitis. There is no drainage. His echocardiogram is scheduled for today.  At that time, my plan was: Wounds are healing nicely. I recommended that he leave the wound on his right hand open to the air to  allow the skin to toughen up and dry out but there is no residual ulcer at this point. I covered the tip of his left index pain with petroleum gauze and wrapped with Coban and a Band-Aid. Recheck this in one week but it is healing nicely. Because the skin is healing so well, I believe that arterial embolization is highly unlikely at this point. He has an echocardiogram today to assess for any signs of endocarditis. If echocardiogram is negative and wounds continue to heal, no further follow-up is necessary for this issue. However I'm not sure how the ulcers  developed initially  03/10/17 TTE revealed no valve vegetations or embolic sources.  However, right index finger is much worse.  Distal to pip joint is now bruised with sluggish cap refill, worsening skin ulceration with desquamation.  It is also tender to palpation distal to the pip joint.  This is much worse than 1 week ago when it was healing well suggesting new ischemic insult.  The left index finger is essentially unchanged.   Past Medical History:  Diagnosis Date  . Atelectasis of right lung   . Chronic diastolic (congestive) heart failure ([place])    a. 10/2013 Echo: EF 55-60%, basal-mid anteroseptal and basal-mid inferoseptal HK.  . Chronic fatigue   . Chronic subdural hematoma ([place])    a. 12/2015 Head CT: chronic right holo-hemispheric SDH w/o midline shift.  . Coronary artery disease    a. 11/2014 NSTEMI: DESx 2 placed to LAD and RCA; b. 08/2015 Cath: LAD patent stent, LCX 28m, RCA patent stent, 61m.  . Diabetes mellitus, type II ([place])    a. HgA1c 6.8 in 03/2015  . ED (erectile dysfunction)   . Gout   . Hemangioma of liver 08/02/2015   12 mm enhancing lesion seen on CT - suspicious for benign hemangioma but not diagnostic - MRI recommended  . HLD (hyperlipidemia)   . Hypertensive heart disease   . Kidney stones   . Lower extremity edema    a. chronic  . OA (osteoarthritis)   . Orthostatic hypotension   . PAF (paroxysmal atrial fibrillation) ([place])    a. 10/2015 s/p DCCV;  b. CHA2DS2VASc = 6--> on coumadin; c. 10/2015 Echo: sev dil LA.  Marland Kitchen Physical deconditioning   . Prostate cancer ([place])   . Pulmonary HTN ([place])   . Pulmonary hypertension ([place])    a. 10/2015 Echo: PASP 70mmHg.  Marland Kitchen Severe mitral regurgitation    a.  10/2015 s/p minimally invasive MV repair; b. 10/2015 Echo: mod MS, mean grad 85mmHg.  Marland Kitchen Spinal stenosis   . SVT (supraventricular tachycardia) ([facility])    a. recurrent, usually responsive to vagal manuevers  . Tricuspid regurgitation    a. 10/2015 Echo: mild to mod TR.   Past  Surgical History:  Procedure Laterality Date  . BACK SURGERY    . Back surgery x 2    . CARDIAC CATHETERIZATION N/A 08/08/2015   Procedure: Right/Left Heart Cath and Coronary Angiography;  Surgeon: Sherren Mocha, MD;  Location: [facility];  Service: Cardiovascular;  Laterality: N/A;  . CARDIAC SURGERY     2 cardiac stents  . CARDIOVERSION N/A 10/20/2015   Procedure: CARDIOVERSION;  Surgeon: Pixie Casino, MD;  Location: [facility];  Service: Cardiovascular;  Laterality: N/A;  . CERVICAL SPINE SURGERY    . CHOLECYSTECTOMY N/A 07/08/2016   Procedure: LAPAROSCOPIC CHOLECYSTECTOMY WITH INTRAOPERATIVE CHOLANGIOGRAM;  Surgeon: Georganna Skeans, MD;  Location: [facility];  Service: General;  Laterality: N/A;  .  EYE SURGERY    . INTRAOPERATIVE TRANSESOPHAGEAL ECHOCARDIOGRAM  10/05/2015   Procedure: INTRAOPERATIVE TRANSESOPHAGEAL ECHOCARDIOGRAM;  Surgeon: Rexene Alberts, MD;  Location: [facility];  Service: Open Heart Surgery;;  . JOINT REPLACEMENT     shoulder  . LEFT HEART CATHETERIZATION WITH CORONARY ANGIOGRAM N/A 11/16/2014   Procedure: LEFT HEART CATHETERIZATION WITH CORONARY ANGIOGRAM;  Surgeon: Troy Sine, MD;  Location[facility];  Service: Cardiovascular;  Laterality: N/A;  . MITRAL VALVE REPAIR Right 10/04/2015   Procedure: MINIMALLY INVASIVE MITRAL VALVE REPAIR (MVR);  Surgeon: Rexene Alberts, MD;  Location: [place];  Service: Open Heart Surgery;  Laterality: Right;  . Rt rotator cuff repair    . TEE WITHOUT CARDIOVERSION N/A 06/22/2015   Procedure: TRANSESOPHAGEAL ECHOCARDIOGRAM (TEE);  Surgeon: Skeet Latch, MD;  Location: [place];  Service: Cardiovascular;  Laterality: N/A;  . TEE WITHOUT CARDIOVERSION N/A 10/04/2015   Procedure: TRANSESOPHAGEAL ECHOCARDIOGRAM (TEE);  Surgeon: Rexene Alberts, MD;  Location: [place];  Service: Open Heart Surgery;  Laterality: N/A;  . WOUND EXPLORATION N/A 10/05/2015   Procedure: Sternal Incision;  Surgeon: Rexene Alberts, MD;  Location: [place];   Service: Open Heart Surgery;  Laterality: N/A;   Current Outpatient Prescriptions on File Prior to Visit  Medication Sig Dispense Refill  . allopurinol (ZYLOPRIM) 100 MG tablet TAKE ONE (1) TABLET EACH DAY 90 tablet 3  . colchicine 0.6 MG tablet Take 1 tablet (0.6 mg total) by mouth daily. 30 tablet 5  . finasteride (PROSCAR) 5 MG tablet TAKE ONE (1) TABLET EACH DAY 30 tablet 11  . furosemide (LASIX) 40 MG tablet Take 1 tablet (40 mg total) by mouth 2 (two) times daily. 60 tablet 3  . magnesium oxide (MAG-OX) 400 MG tablet Take 1 tablet (400 mg total) by mouth daily. 30 tablet 0  . metoprolol tartrate (LOPRESSOR) 25 MG tablet TAKE ONE-HALF TABLET BY MOUTH TWICE DAILY 30 tablet 1  . OVER THE COUNTER MEDICATION Neuroquell: Take 1 capsule by mouth once a day for immune health    . potassium chloride (KLOR-CON) 20 MEQ packet Take 20 mEq by mouth daily. 30 packet 0  . pravastatin (PRAVACHOL) 40 MG tablet Take 1 tablet (40 mg total) by mouth daily at 6 PM. 30 tablet 11  . traMADol (ULTRAM) 50 MG tablet Take 1 tablet (50 mg total) by mouth every 6 (six) hours as needed for moderate pain. 30 tablet 0  . Wheat Dextrin (BENEFIBER) POWD Take by mouth See admin instructions. Mix one teaspoonful into 6-8 ounces of fluid/milk and drink once daily     No current facility-administered medications on file prior to visit.    No Known Allergies Social History   Social History  . Marital status: Married    Spouse name: N/A  . Number of children: 4  . Years of education: N/A   Occupational History  .  Retired   Social History Main Topics  . Smoking status: Former Smoker    Types: Cigarettes, Pipe, Landscape architect  . Smokeless tobacco: Former Systems developer    Types: Chew     Comment: [place] YEARS AGO"  . Alcohol use No  . Drug use: No  . Sexual activity: Not on file   Other Topics Concern  . Not on file   Social History Narrative   ** Merged History Encounter **       Four adopted children.  Lives  alone.       Review of  Systems  All other systems reviewed and are negative.      Objective:   Physical Exam  Constitutional: He appears well-developed and well-nourished. No distress.  Cardiovascular: Normal rate and intact distal pulses.  An irregularly irregular rhythm present.  Murmur heard. Pulmonary/Chest: Effort normal and breath sounds normal. No respiratory distress. He has no wheezes. He has no rales. He exhibits no tenderness.  Abdominal: Soft. Bowel sounds are normal. He exhibits no distension. There is no tenderness. There is no rebound and no guarding.  Musculoskeletal:       Right hand: He exhibits tenderness, bony tenderness, disruption of two-point discrimination, decreased capillary refill and deformity. Decreased sensation noted.       Hands: Skin: No rash noted. He is not diaphoretic. No erythema.          Assessment & Plan:  Arterial embolism and thrombosis ([place]) - Plan: Ambulatory referral to Vascular Surgery  I am concerned that despite negative TTE, he may have valve vegetations or other sources or arterial emboli from his previous MV replacement.  I will resume coumadin.  Spoke with Dr. Lissa Morales regarding arterial insufficiency who recommended getting him back in with Dr. Roxy Manns who performed the valve repair.  However, Dr. Roxy Manns is out of the office and his office recommended him seeing his cardiologist first to determine embolic source.  I will also arrange hand consult to determine when or if surgical resection is necessary. Start Coumadin 5 mg poqday and recheck INR Friday.

## 2017-03-11 ENCOUNTER — Encounter: Payer: Self-pay | Admitting: Cardiology

## 2017-03-11 ENCOUNTER — Ambulatory Visit (INDEPENDENT_AMBULATORY_CARE_PROVIDER_SITE_OTHER): Payer: Medicare Other | Admitting: Cardiology

## 2017-03-11 VITALS — BP 127/73 | HR 63 | Ht 67.0 in | Wt 194.0 lb

## 2017-03-11 DIAGNOSIS — I480 Paroxysmal atrial fibrillation: Secondary | ICD-10-CM | POA: Diagnosis not present

## 2017-03-11 DIAGNOSIS — L98491 Non-pressure chronic ulcer of skin of other sites limited to breakdown of skin: Secondary | ICD-10-CM | POA: Diagnosis not present

## 2017-03-11 NOTE — Patient Instructions (Signed)
Medication Instructions:  Continue current medications  If you need a refill on your cardiac medications before your next appointment, please call your pharmacy.  Labwork: None Ordered  Testing/Procedures: None Ordered  Follow-Up: Your physician recommends that you schedule a follow-up appointment in: 1 Month     Thank you for choosing [facility] at [employer]!!     -

## 2017-03-12 ENCOUNTER — Encounter: Payer: Self-pay | Admitting: Cardiology

## 2017-03-12 DIAGNOSIS — L98A391 Non-pressure chronic ulcer of unspecified hand limited to breakdown of skin: Secondary | ICD-10-CM | POA: Insufficient documentation

## 2017-03-12 DIAGNOSIS — L98491 Non-pressure chronic ulcer of skin of other sites limited to breakdown of skin: Secondary | ICD-10-CM | POA: Insufficient documentation

## 2017-03-14 ENCOUNTER — Ambulatory Visit (INDEPENDENT_AMBULATORY_CARE_PROVIDER_SITE_OTHER): Payer: Medicare Other | Admitting: Family Medicine

## 2017-03-14 ENCOUNTER — Encounter: Payer: Self-pay | Admitting: Family Medicine

## 2017-03-14 VITALS — BP 112/62 | HR 60 | Temp 98.0°F | Resp 16 | Ht 67.0 in | Wt 201.0 lb

## 2017-03-14 DIAGNOSIS — I749 Embolism and thrombosis of unspecified artery: Secondary | ICD-10-CM | POA: Diagnosis not present

## 2017-03-14 LAB — PT WITH INR/FINGERSTICK
INR FINGERSTICK: 1.3 — AB (ref 0.9–1.1)
PT FINGERSTICK: 15.8 s — AB (ref 10.5–13.1)

## 2017-03-14 NOTE — Progress Notes (Signed)
Subjective:    Patient ID: [name], male    DOB: 06/20/1929, 83 y.o.   MRN: [id number]  HPI  02/24/17 Patient has a very complicated past medical history including coronary artery disease status post CABG as well as mitral valve replacement with a history of atrial fibrillation. Patient is not currently on any anticoagulation due to the fact he recently fell in April and suffered a subarachnoid hemorrhage. Because of the fall risk, he was deemed unsafe for chronic anticoagulation. Furthermore, the patient does not have an artificial valve. Patient states 1 week ago, he developed severe pain in the dorsum of his left hand suddenly. The following morning, a blister developed on his distal second finger tip on the finger pad. The blister ruptured and there is now a 1.5 cm ulcer on the tip of his left second digit down to the subcutaneous fat. Friday, 3 days ago, the patient develops similar pain in the dorsum of his right hand and the following morning, developed a blister on the tip of his right second digit on the finger pad. There is now a 1 cm ulcer down to the subcutaneous fat on the finger pad surface of the second digit on his right hand. Patient denies any injuries to the fingers. He denies any injury to the hand. This seems to represent some type of embolic infarction of the skin of the fingertips. Concern would be for cardiac source or possibly valve vegetation.  At that time, my plan was: The wounds were dressed with Silvadene, petroleum gauze, and wrapped in Coban. Recheck the wounds in 48 hours to assess for healing. My biggest concern is arterial embolization from a cardiac source. Therefore I'll obtain a complete echocardiogram of the heart. Given his age, I will avoid TEE and start with a basic echocardiogram because of the need for sedation. If echocardiogram is negative for valvular vegetation or embolic source, I will consult cardiology for a second opinion and turn our focus on wound  care. Patient may require arterial studies of the upper extremities and/or TEE.  02/27/17 Today for recheck. Echocardiogram is scheduled for next week.  Wound on the tip of his right index finger is much better. It is now 3 mm x 16 mm. Wound on the tip of his left index finger is essentially unchanged and is 13 mm in diameter. Apparently the dressing fell off of this and the patient was unable to redress the wound. There is no evidence of cellulitis or infection. There is no drainage.  At that time, my plan was: The wound on the tip of his right index finger is healing nicely. I again cover the wound with Silvadene, petroleum gauze and wrapped in Coban.  I suspect that the wound on the tip of his left index finger is slow to heal because the dressing came off. Therefore I reapplied the dressing coating the wound with Silvadene, wrapping it with petroleum gauze, and then wrapping the entire finger in Coban and. Recheck the wounds on Monday.  03/03/17 Wound on the tip of the right index finger has essentially healed. The wound on the tip of the left index finger is much better and is now 8 mm in diameter. It is slowly healing but there is no evidence of cellulitis. There is no drainage. His echocardiogram is scheduled for today.  At that time, my plan was: Wounds are healing nicely. I recommended that he leave the wound on his right hand open to the air to  allow the skin to toughen up and dry out but there is no residual ulcer at this point. I covered the tip of his left index pain with petroleum gauze and wrapped with Coban and a Band-Aid. Recheck this in one week but it is healing nicely. Because the skin is healing so well, I believe that arterial embolization is highly unlikely at this point. He has an echocardiogram today to assess for any signs of endocarditis. If echocardiogram is negative and wounds continue to heal, no further follow-up is necessary for this issue. However I'm not sure how the ulcers  developed initially  03/10/17 TTE revealed no valve vegetations or embolic sources.  However, right index finger is much worse.  Distal to pip joint is now bruised with sluggish cap refill, worsening skin ulceration with desquamation.  It is also tender to palpation distal to the pip joint.  This is much worse than 1 week ago when it was healing well suggesting new ischemic insult.  The left index finger is essentially unchanged.  At that time, my plan was: I am concerned that despite negative TTE, he may have valve vegetations or other sources or arterial emboli from his previous MV replacement.  I will resume coumadin.  Spoke with Dr. Lissa Morales regarding arterial insufficiency who recommended getting him back in with Dr. Roxy Manns who performed the valve repair.  However, Dr. Roxy Manns is out of the office and his office recommended him seeing his cardiologist first to determine embolic source.  I will also arrange hand consult to determine when or if surgical resection is necessary. Start Coumadin 5 mg poqday and recheck INR Friday.    03/14/17 Saw Dr. Percival Spanish with cardiology who I appreciate working him in on short notice.  I have copied his recommendations below for reference:  suspect this might be embolic phenomenon and he is now back on anticoagulation. I don't think that further imaging for revascularization with angiography or other is indicated in that he has good radial pulses and this appears to be a distal phenomenon.  He has had no symptoms consistent with a vasospastic or vasculitic process.  He seems to be resolving without new lesions  He is here today to recheck INR.  Has been on coumadin 5 mg poqday (Today should be day 5).  However, he has actually only taken 5 mg a day for 2 days.  INR is 1.3.   Past Medical History:  Diagnosis Date  . Atelectasis of right lung   . Chronic diastolic (congestive) heart failure ([place])    a. 10/2013 Echo: EF 55-60%, basal-mid anteroseptal and basal-mid  inferoseptal HK.  . Chronic fatigue   . Chronic subdural hematoma ([place])    a. 12/2015 Head CT: chronic right holo-hemispheric SDH w/o midline shift.  . Coronary artery disease    a. 11/2014 NSTEMI: DESx 2 placed to LAD and RCA; b. 08/2015 Cath: LAD patent stent, LCX 31m, RCA patent stent, 8m.  . Diabetes mellitus, type II ([place])    a. HgA1c 6.8 in 03/2015  . ED (erectile dysfunction)   . Gout   . Hemangioma of liver 08/02/2015   12 mm enhancing lesion seen on CT - suspicious for benign hemangioma but not diagnostic - MRI recommended  . HLD (hyperlipidemia)   . Hypertensive heart disease   . Kidney stones   . Lower extremity edema    a. chronic  . OA (osteoarthritis)   . Orthostatic hypotension   . PAF (paroxysmal atrial fibrillation) ([place])  a. 10/2015 s/p DCCV;  b. CHA2DS2VASc = 6--> on coumadin; c. 10/2015 Echo: sev dil LA.  Marland Kitchen Physical deconditioning   . Prostate cancer ([place])   . Pulmonary HTN ([place])   . Pulmonary hypertension ([place])    a. 10/2015 Echo: PASP 22mmHg.  Marland Kitchen Severe mitral regurgitation    a.  10/2015 s/p minimally invasive MV repair; b. 10/2015 Echo: mod MS, mean grad 72mmHg.  Marland Kitchen Spinal stenosis   . SVT (supraventricular tachycardia) (HCC)    a. recurrent, usually responsive to vagal manuevers  . Tricuspid regurgitation    a. 10/2015 Echo: mild to mod TR.   Past Surgical History:  Procedure Laterality Date  . BACK SURGERY    . Back surgery x 2    . CARDIAC CATHETERIZATION N/A 08/08/2015   Procedure: Right/Left Heart Cath and Coronary Angiography;  Surgeon: Sherren Mocha, MD;  Location: [facility];  Service: Cardiovascular;  Laterality: N/A;  . CARDIAC SURGERY     2 cardiac stents  . CARDIOVERSION N/A 10/20/2015   Procedure: CARDIOVERSION;  Surgeon: Pixie Casino, MD;  Location: [facility];  Service: Cardiovascular;  Laterality: N/A;  . CERVICAL SPINE SURGERY    . CHOLECYSTECTOMY N/A 07/08/2016   Procedure: LAPAROSCOPIC CHOLECYSTECTOMY WITH INTRAOPERATIVE  CHOLANGIOGRAM;  Surgeon: Georganna Skeans, MD;  Location: [facility];  Service: General;  Laterality: N/A;  . EYE SURGERY    . INTRAOPERATIVE TRANSESOPHAGEAL ECHOCARDIOGRAM  10/05/2015   Procedure: INTRAOPERATIVE TRANSESOPHAGEAL ECHOCARDIOGRAM;  Surgeon: Rexene Alberts, MD;  Location: [facility];  Service: Open Heart Surgery;;  . JOINT REPLACEMENT     shoulder  . LEFT HEART CATHETERIZATION WITH CORONARY ANGIOGRAM N/A 11/16/2014   Procedure: LEFT HEART CATHETERIZATION WITH CORONARY ANGIOGRAM;  Surgeon: Troy Sine, MD;  Location: [facility];  Service: Cardiovascular;  Laterality: N/A;  . MITRAL VALVE REPAIR Right 10/04/2015   Procedure: MINIMALLY INVASIVE MITRAL VALVE REPAIR (MVR);  Surgeon: Rexene Alberts, MD;  Location: [facility];  Service: Open Heart Surgery;  Laterality: Right;  . Rt rotator cuff repair    . TEE WITHOUT CARDIOVERSION N/A 06/22/2015   Procedure: TRANSESOPHAGEAL ECHOCARDIOGRAM (TEE);  Surgeon: Skeet Latch, MD;  Location: [facility];  Service: Cardiovascular;  Laterality: N/A;  . TEE WITHOUT CARDIOVERSION N/A 10/04/2015   Procedure: TRANSESOPHAGEAL ECHOCARDIOGRAM (TEE);  Surgeon: Rexene Alberts, MD;  Location: [facility];  Service: Open Heart Surgery;  Laterality: N/A;  . WOUND EXPLORATION N/A 10/05/2015   Procedure: Sternal Incision;  Surgeon: Rexene Alberts, MD;  Location: [facility];  Service: Open Heart Surgery;  Laterality: N/A;   Current Outpatient Prescriptions on File Prior to Visit  Medication Sig Dispense Refill  . allopurinol (ZYLOPRIM) 100 MG tablet TAKE ONE (1) TABLET EACH DAY 90 tablet 3  . colchicine 0.6 MG tablet Take 1 tablet (0.6 mg total) by mouth daily. 30 tablet 5  . finasteride (PROSCAR) 5 MG tablet TAKE ONE (1) TABLET EACH DAY 30 tablet 11  . furosemide (LASIX) 40 MG tablet Take 1 tablet (40 mg total) by mouth 2 (two) times daily. 60 tablet 3  . magnesium oxide (MAG-OX) 400 MG tablet Take 1 tablet (400 mg total) by mouth daily. 30 tablet 0  . metoprolol tartrate  (LOPRESSOR) 25 MG tablet TAKE ONE-HALF TABLET BY MOUTH TWICE DAILY 30 tablet 1  . OVER THE COUNTER MEDICATION Neuroquell: Take 1 capsule by mouth once a day for immune health    . potassium chloride (KLOR-CON) 20 MEQ packet Take 20 mEq by  mouth daily. 30 packet 0  . pravastatin (PRAVACHOL) 40 MG tablet Take 1 tablet (40 mg total) by mouth daily at 6 PM. 30 tablet 11  . traMADol (ULTRAM) 50 MG tablet Take 1 tablet (50 mg total) by mouth every 6 (six) hours as needed for moderate pain. 30 tablet 0  . warfarin (COUMADIN) 5 MG tablet Take 1 tablet (5 mg total) by mouth daily. 30 tablet 3  . Wheat Dextrin (BENEFIBER) POWD Take by mouth See admin instructions. Mix one teaspoonful into 6-8 ounces of fluid/milk and drink once daily     No current facility-administered medications on file prior to visit.    No Known Allergies Social History   Social History  . Marital status: Married    Spouse name: N/A  . Number of children: 4  . Years of education: N/A   Occupational History  .  Retired   Social History Main Topics  . Smoking status: Former Smoker    Types: Cigarettes, Pipe, Landscape architect  . Smokeless tobacco: Former Systems developer    Types: Chew     Comment: [place] YEARS AGO"  . Alcohol use No  . Drug use: No  . Sexual activity: Not on file   Other Topics Concern  . Not on file   Social History Narrative   ** Merged History Encounter **       Four adopted children.  Lives alone.       Review of Systems  All other systems reviewed and are negative.      Objective:   Physical Exam  Constitutional: He appears well-developed and well-nourished. No distress.  Cardiovascular: Normal rate and intact distal pulses.  An irregularly irregular rhythm present.  Murmur heard. Pulmonary/Chest: Effort normal and breath sounds normal. No respiratory distress. He has no wheezes. He has no rales. He exhibits no tenderness.  Abdominal: Soft. Bowel sounds are normal. He exhibits no distension.  There is no tenderness. There is no rebound and no guarding.  Musculoskeletal:       Right hand: He exhibits tenderness, bony tenderness, disruption of two-point discrimination, decreased capillary refill and deformity. Decreased sensation noted.       Hands: Skin: No rash noted. He is not diaphoretic. No erythema.          Assessment & Plan:  Arterial embolism and thrombosis (HCC) - Plan: PT with INR/Fingerstick, PT with INR/Fingerstick  Continue Coumadin but resume previous dose that he was taking prior to his subarachnoid hemorrhage which was 2 mg every day and recheck INR in 2 weeks. I appreciate cardiology's assistance. Meanwhile I will await the hand surgeons opinion about removing any nonviable debris. Today tips of his fingers actually look better than they did on Monday. Hopefully debridement will be unnecessary. There is no evidence of gangrene or cellulitis

## 2017-03-24 ENCOUNTER — Telehealth: Payer: Self-pay | Admitting: Family Medicine

## 2017-03-24 ENCOUNTER — Other Ambulatory Visit: Payer: Self-pay | Admitting: Family Medicine

## 2017-03-24 NOTE — Telephone Encounter (Signed)
Pt needs refill on lasix sent to [facility].

## 2017-03-27 ENCOUNTER — Encounter: Payer: Self-pay | Admitting: Family Medicine

## 2017-03-27 ENCOUNTER — Ambulatory Visit (INDEPENDENT_AMBULATORY_CARE_PROVIDER_SITE_OTHER): Payer: Medicare Other | Admitting: Family Medicine

## 2017-03-27 VITALS — BP 130/60 | HR 72 | Temp 97.5°F | Resp 20 | Ht 67.0 in | Wt 199.0 lb

## 2017-03-27 DIAGNOSIS — I749 Embolism and thrombosis of unspecified artery: Secondary | ICD-10-CM | POA: Diagnosis not present

## 2017-03-27 LAB — PT WITH INR/FINGERSTICK
INR, fingerstick: 1.3 — ABNORMAL HIGH (ref 0.9–1.1)
PT, fingerstick: 15.8 seconds — ABNORMAL HIGH (ref 10.5–13.1)

## 2017-03-27 MED ORDER — WARFARIN SODIUM 2 MG PO TABS: 2.0000 mg | ORAL_TABLET | Freq: Every day | ORAL | 3 refills | Status: DC

## 2017-03-27 NOTE — Progress Notes (Signed)
Subjective:    Patient ID: [name], male    DOB: [birth year], 83 y.o.   MRN: [id number]  HPI  02/24/17 Patient has a very complicated past medical history including coronary artery disease status post CABG as well as mitral valve replacement with a history of atrial fibrillation. Patient is not currently on any anticoagulation due to the fact he recently fell in April and suffered a subarachnoid hemorrhage. Because of the fall risk, he was deemed unsafe for chronic anticoagulation. Furthermore, the patient does not have an artificial valve. Patient states 1 week ago, he developed severe pain in the dorsum of his left hand suddenly. The following morning, a blister developed on his distal second finger tip on the finger pad. The blister ruptured and there is now a 1.5 cm ulcer on the tip of his left second digit down to the subcutaneous fat. Friday, 3 days ago, the patient develops similar pain in the dorsum of his right hand and the following morning, developed a blister on the tip of his right second digit on the finger pad. There is now a 1 cm ulcer down to the subcutaneous fat on the finger pad surface of the second digit on his right hand. Patient denies any injuries to the fingers. He denies any injury to the hand. This seems to represent some type of embolic infarction of the skin of the fingertips. Concern would be for cardiac source or possibly valve vegetation.  At that time, my plan was: The wounds were dressed with Silvadene, petroleum gauze, and wrapped in Coban. Recheck the wounds in 48 hours to assess for healing. My biggest concern is arterial embolization from a cardiac source. Therefore I'll obtain a complete echocardiogram of the heart. Given his age, I will avoid TEE and start with a basic echocardiogram because of the need for sedation. If echocardiogram is negative for valvular vegetation or embolic source, I will consult cardiology for a second opinion and turn our focus on wound  care. Patient may require arterial studies of the upper extremities and/or TEE.  02/27/17 Today for recheck. Echocardiogram is scheduled for next week.  Wound on the tip of his right index finger is much better. It is now 3 mm x 16 mm. Wound on the tip of his left index finger is essentially unchanged and is 13 mm in diameter. Apparently the dressing fell off of this and the patient was unable to redress the wound. There is no evidence of cellulitis or infection. There is no drainage.  At that time, my plan was: The wound on the tip of his right index finger is healing nicely. I again cover the wound with Silvadene, petroleum gauze and wrapped in Coban.  I suspect that the wound on the tip of his left index finger is slow to heal because the dressing came off. Therefore I reapplied the dressing coating the wound with Silvadene, wrapping it with petroleum gauze, and then wrapping the entire finger in Coban and. Recheck the wounds on Monday.  03/03/17 Wound on the tip of the right index finger has essentially healed. The wound on the tip of the left index finger is much better and is now 8 mm in diameter. It is slowly healing but there is no evidence of cellulitis. There is no drainage. His echocardiogram is scheduled for today.  At that time, my plan was: Wounds are healing nicely. I recommended that he leave the wound on his right hand open to the air to  allow the skin to toughen up and dry out but there is no residual ulcer at this point. I covered the tip of his left index pain with petroleum gauze and wrapped with Coban and a Band-Aid. Recheck this in one week but it is healing nicely. Because the skin is healing so well, I believe that arterial embolization is highly unlikely at this point. He has an echocardiogram today to assess for any signs of endocarditis. If echocardiogram is negative and wounds continue to heal, no further follow-up is necessary for this issue. However I'm not sure how the ulcers  developed initially  03/10/17 TTE revealed no valve vegetations or embolic sources.  However, right index finger is much worse.  Distal to pip joint is now bruised with sluggish cap refill, worsening skin ulceration with desquamation.  It is also tender to palpation distal to the pip joint.  This is much worse than 1 week ago when it was healing well suggesting new ischemic insult.  The left index finger is essentially unchanged.  At that time, my plan was: I am concerned that despite negative TTE, he may have valve vegetations or other sources or arterial emboli from his previous MV replacement.  I will resume coumadin.  Spoke with Dr. Lissa Morales regarding arterial insufficiency who recommended getting him back in with Dr. Roxy Manns who performed the valve repair.  However, Dr. Roxy Manns is out of the office and his office recommended him seeing his cardiologist first to determine embolic source.  I will also arrange hand consult to determine when or if surgical resection is necessary. Start Coumadin 5 mg poqday and recheck INR Friday.    03/14/17 Saw Dr. Percival Spanish with cardiology who I appreciate working him in on short notice.  I have copied his recommendations below for reference:  suspect this might be embolic phenomenon and he is now back on anticoagulation. I don't think that further imaging for revascularization with angiography or other is indicated in that he has good radial pulses and this appears to be a distal phenomenon.  He has had no symptoms consistent with a vasospastic or vasculitic process.  He seems to be resolving without new lesions  He is here today to recheck INR.  Has been on coumadin 5 mg poqday (Today should be day 5).  However, he has actually only taken 5 mg a day for 2 days.  INR is 1.3.  At that time, my plan was: Continue Coumadin but resume previous dose that he was taking prior to his subarachnoid hemorrhage which was 2 mg every day and recheck INR in 2 weeks. I appreciate  cardiology's assistance. Meanwhile I will await the hand surgeons opinion about removing any nonviable debris. Today tips of his fingers actually look better than they did on Monday. Hopefully debridement will be unnecessary. There is no evidence of gangrene or cellulitis  03/27/17 Patient is here today unaccompanied. He states that he is taking his Coumadin although he cannot tell me what dose he states he. Previously 2 mg a day was enough to keep his INR therapeutic. However today his INR is 1.3 and is exactly the same as what it was 2 weeks ago despite the fact he is supposedly taking Coumadin 2 mg every day. I question given his memory is actually taking medication appropriately. The skin on his right index finger is much improved and has essentially healed. There is still some peeling skin but it is remarkably better than when I last seen him. The black  tissue has disappeared. On the tip of his left index finger there is an area approximately 7 mm in diameter with thick hyperkeratotic tissue with ulcer used to be however the remaining skin appears healthy and viable Past Medical History:  Diagnosis Date  . Atelectasis of right lung   . Chronic diastolic (congestive) heart failure ([place])    a. 10/2013 Echo: EF 55-60%, basal-mid anteroseptal and basal-mid inferoseptal HK.  . Chronic fatigue   . Chronic subdural hematoma ([place])    a. 12/2015 Head CT: chronic right holo-hemispheric SDH w/o midline shift.  . Coronary artery disease    a. 11/2014 NSTEMI: DESx 2 placed to LAD and RCA; b. 08/2015 Cath: LAD patent stent, LCX 54m, RCA patent stent, 46m.  . Diabetes mellitus, type II ([place])    a. HgA1c 6.8 in 03/2015  . ED (erectile dysfunction)   . Gout   . Hemangioma of liver 08/02/2015   12 mm enhancing lesion seen on CT - suspicious for benign hemangioma but not diagnostic - MRI recommended  . HLD (hyperlipidemia)   . Hypertensive heart disease   . Kidney stones   . Lower extremity edema    a. chronic    . OA (osteoarthritis)   . Orthostatic hypotension   . PAF (paroxysmal atrial fibrillation) ([place])    a. 10/2015 s/p DCCV;  b. CHA2DS2VASc = 6--> on coumadin; c. 10/2015 Echo: sev dil LA.  Marland Kitchen Physical deconditioning   . Prostate cancer ([place])   . Pulmonary HTN ([place])   . Pulmonary hypertension ([place])    a. 10/2015 Echo: PASP 68mmHg.  Marland Kitchen Severe mitral regurgitation    a.  10/2015 s/p minimally invasive MV repair; b. 10/2015 Echo: mod MS, mean grad 72mmHg.  Marland Kitchen Spinal stenosis   . SVT (supraventricular tachycardia) ([facility])    a. recurrent, usually responsive to vagal manuevers  . Tricuspid regurgitation    a. 10/2015 Echo: mild to mod TR.   Past Surgical History:  Procedure Laterality Date  . BACK SURGERY    . Back surgery x 2    . CARDIAC CATHETERIZATION N/A 08/08/2015   Procedure: Right/Left Heart Cath and Coronary Angiography;  Surgeon: Sherren Mocha, MD;  Location: [facility];  Service: Cardiovascular;  Laterality: N/A;  . CARDIAC SURGERY     2 cardiac stents  . CARDIOVERSION N/A 10/20/2015   Procedure: CARDIOVERSION;  Surgeon: Pixie Casino, MD;  Location: [facility];  Service: Cardiovascular;  Laterality: N/A;  . CERVICAL SPINE SURGERY    . CHOLECYSTECTOMY N/A 07/08/2016   Procedure: LAPAROSCOPIC CHOLECYSTECTOMY WITH INTRAOPERATIVE CHOLANGIOGRAM;  Surgeon: Georganna Skeans, MD;  Location: [facility];  Service: General;  Laterality: N/A;  . EYE SURGERY    . INTRAOPERATIVE TRANSESOPHAGEAL ECHOCARDIOGRAM  10/05/2015   Procedure: INTRAOPERATIVE TRANSESOPHAGEAL ECHOCARDIOGRAM;  Surgeon: Rexene Alberts, MD;  Location: [facility] OR;  Service: Open Heart Surgery;;  . JOINT REPLACEMENT     shoulder  . LEFT HEART CATHETERIZATION WITH CORONARY ANGIOGRAM N/A 11/16/2014   Procedure: LEFT HEART CATHETERIZATION WITH CORONARY ANGIOGRAM;  Surgeon: Troy Sine, MD;  Location: [facility];  Service: Cardiovascular;  Laterality: N/A;  . MITRAL VALVE REPAIR Right 10/04/2015   Procedure: MINIMALLY INVASIVE MITRAL  VALVE REPAIR (MVR);  Surgeon: Rexene Alberts, MD;  Location: [facility];  Service: Open Heart Surgery;  Laterality: Right;  . Rt rotator cuff repair    . TEE WITHOUT CARDIOVERSION N/A 06/22/2015   Procedure: TRANSESOPHAGEAL ECHOCARDIOGRAM (TEE);  Surgeon: Skeet Latch, MD;  Location:  [facility];  Service: Cardiovascular;  Laterality: N/A;  . TEE WITHOUT CARDIOVERSION N/A 10/04/2015   Procedure: TRANSESOPHAGEAL ECHOCARDIOGRAM (TEE);  Surgeon: Rexene Alberts, MD;  Location: [facility];  Service: Open Heart Surgery;  Laterality: N/A;  . WOUND EXPLORATION N/A 10/05/2015   Procedure: Sternal Incision;  Surgeon: Rexene Alberts, MD;  Location: [facility];  Service: Open Heart Surgery;  Laterality: N/A;   Current Outpatient Prescriptions on File Prior to Visit  Medication Sig Dispense Refill  . allopurinol (ZYLOPRIM) 100 MG tablet TAKE ONE (1) TABLET EACH DAY 90 tablet 3  . colchicine 0.6 MG tablet Take 1 tablet (0.6 mg total) by mouth daily. 30 tablet 5  . finasteride (PROSCAR) 5 MG tablet TAKE ONE (1) TABLET BY MOUTH EVERY DAY 30 tablet 5  . furosemide (LASIX) 40 MG tablet Take 1 tablet (40 mg total) by mouth 2 (two) times daily. 60 tablet 5  . magnesium oxide (MAG-OX) 400 MG tablet Take 1 tablet (400 mg total) by mouth daily. 30 tablet 0  . metoprolol tartrate (LOPRESSOR) 25 MG tablet TAKE ONE-HALF TABLET BY MOUTH TWICE DAILY 30 tablet 5  . OVER THE COUNTER MEDICATION Neuroquell: Take 1 capsule by mouth once a day for immune health    . potassium chloride (KLOR-CON) 20 MEQ packet Take 20 mEq by mouth daily. 30 packet 0  . pravastatin (PRAVACHOL) 40 MG tablet Take 1 tablet (40 mg total) by mouth daily at 6 PM. 30 tablet 11  . traMADol (ULTRAM) 50 MG tablet Take 1 tablet (50 mg total) by mouth every 6 (six) hours as needed for moderate pain. 30 tablet 0  . Wheat Dextrin (BENEFIBER) POWD Take by mouth See admin instructions. Mix one teaspoonful into 6-8 ounces of fluid/milk and drink once daily     No current  facility-administered medications on file prior to visit.    No Known Allergies Social History   Social History  . Marital status: Married    Spouse name: N/A  . Number of children: 4  . Years of education: N/A   Occupational History  .  Retired   Social History Main Topics  . Smoking status: Former Smoker    Types: Cigarettes, Pipe, Landscape architect  . Smokeless tobacco: Former Systems developer    Types: Chew     Comment: [place] YEARS AGO"  . Alcohol use No  . Drug use: No  . Sexual activity: Not on file   Other Topics Concern  . Not on file   Social History Narrative   ** Merged History Encounter **       Four adopted children.  Lives alone.       Review of Systems  All other systems reviewed and are negative.      Objective:   Physical Exam  Constitutional: He appears well-developed and well-nourished. No distress.  Cardiovascular: Normal rate and intact distal pulses.  An irregularly irregular rhythm present.  Murmur heard. Pulmonary/Chest: Effort normal and breath sounds normal. No respiratory distress. He has no wheezes. He has no rales. He exhibits no tenderness.  Abdominal: Soft. Bowel sounds are normal. He exhibits no distension. There is no tenderness. There is no rebound and no guarding.  Musculoskeletal:       Right hand: He exhibits no tenderness, no bony tenderness, normal two-point discrimination, normal capillary refill and no deformity. Normal sensation noted.       Hands: Skin: No rash noted. He is not diaphoretic. No erythema.  Assessment & Plan:  Arterial embolism and thrombosis ([place]) - Plan: PT with INR/Fingerstick The wounds on his fingers are healing nicely. I question the dose of Coumadin the patient has actually taking. For the time being and I have to believe that he is taking 2 mg a day and I will increase his Coumadin to 4 mg on Monday, Wednesday, Friday and take 2 mg on all other days and then repeat INR in 2 weeks. I wrote this  down on a piece of paper for the patient and asked him to give this to his wife who manages his medication

## 2017-03-31 DIAGNOSIS — Z961 Presence of intraocular lens: Secondary | ICD-10-CM | POA: Diagnosis not present

## 2017-03-31 DIAGNOSIS — H25813 Combined forms of age-related cataract, bilateral: Secondary | ICD-10-CM | POA: Diagnosis not present

## 2017-04-04 ENCOUNTER — Ambulatory Visit: Payer: Medicare Other | Admitting: Cardiology

## 2017-04-08 ENCOUNTER — Ambulatory Visit (INDEPENDENT_AMBULATORY_CARE_PROVIDER_SITE_OTHER): Payer: Medicare Other | Admitting: Family Medicine

## 2017-04-08 ENCOUNTER — Encounter: Payer: Self-pay | Admitting: Family Medicine

## 2017-04-08 DIAGNOSIS — I749 Embolism and thrombosis of unspecified artery: Secondary | ICD-10-CM | POA: Diagnosis not present

## 2017-04-08 LAB — PT WITH INR/FINGERSTICK
INR, fingerstick: 1.4 — ABNORMAL HIGH (ref 0.9–1.1)
PT FINGERSTICK: 16.6 s — AB (ref 10.5–13.1)

## 2017-04-08 NOTE — Progress Notes (Signed)
Subjective:    Patient ID: [name], male    DOB: 11/28/1928, 83 y.o.   MRN: [id number]  HPI  02/24/17 Patient has a very complicated past medical history including coronary artery disease status post CABG as well as mitral valve replacement with a history of atrial fibrillation. Patient is not currently on any anticoagulation due to the fact he recently fell in April and suffered a subarachnoid hemorrhage. Because of the fall risk, he was deemed unsafe for chronic anticoagulation. Furthermore, the patient does not have an artificial valve. Patient states 1 week ago, he developed severe pain in the dorsum of his left hand suddenly. The following morning, a blister developed on his distal second finger tip on the finger pad. The blister ruptured and there is now a 1.5 cm ulcer on the tip of his left second digit down to the subcutaneous fat. Friday, 3 days ago, the patient develops similar pain in the dorsum of his right hand and the following morning, developed a blister on the tip of his right second digit on the finger pad. There is now a 1 cm ulcer down to the subcutaneous fat on the finger pad surface of the second digit on his right hand. Patient denies any injuries to the fingers. He denies any injury to the hand. This seems to represent some type of embolic infarction of the skin of the fingertips. Concern would be for cardiac source or possibly valve vegetation.  At that time, my plan was: The wounds were dressed with Silvadene, petroleum gauze, and wrapped in Coban. Recheck the wounds in 48 hours to assess for healing. My biggest concern is arterial embolization from a cardiac source. Therefore I'll obtain a complete echocardiogram of the heart. Given his age, I will avoid TEE and start with a basic echocardiogram because of the need for sedation. If echocardiogram is negative for valvular vegetation or embolic source, I will consult cardiology for a second opinion and turn our focus on wound  care. Patient may require arterial studies of the upper extremities and/or TEE.  02/27/17 Today for recheck. Echocardiogram is scheduled for next week.  Wound on the tip of his right index finger is much better. It is now 3 mm x 16 mm. Wound on the tip of his left index finger is essentially unchanged and is 13 mm in diameter. Apparently the dressing fell off of this and the patient was unable to redress the wound. There is no evidence of cellulitis or infection. There is no drainage.  At that time, my plan was: The wound on the tip of his right index finger is healing nicely. I again cover the wound with Silvadene, petroleum gauze and wrapped in Coban.  I suspect that the wound on the tip of his left index finger is slow to heal because the dressing came off. Therefore I reapplied the dressing coating the wound with Silvadene, wrapping it with petroleum gauze, and then wrapping the entire finger in Coban and. Recheck the wounds on Monday.  03/03/17 Wound on the tip of the right index finger has essentially healed. The wound on the tip of the left index finger is much better and is now 8 mm in diameter. It is slowly healing but there is no evidence of cellulitis. There is no drainage. His echocardiogram is scheduled for today.  At that time, my plan was: Wounds are healing nicely. I recommended that he leave the wound on his right hand open to the air to  allow the skin to toughen up and dry out but there is no residual ulcer at this point. I covered the tip of his left index pain with petroleum gauze and wrapped with Coban and a Band-Aid. Recheck this in one week but it is healing nicely. Because the skin is healing so well, I believe that arterial embolization is highly unlikely at this point. He has an echocardiogram today to assess for any signs of endocarditis. If echocardiogram is negative and wounds continue to heal, no further follow-up is necessary for this issue. However I'm not sure how the ulcers  developed initially  03/10/17 TTE revealed no valve vegetations or embolic sources.  However, right index finger is much worse.  Distal to pip joint is now bruised with sluggish cap refill, worsening skin ulceration with desquamation.  It is also tender to palpation distal to the pip joint.  This is much worse than 1 week ago when it was healing well suggesting new ischemic insult.  The left index finger is essentially unchanged.  At that time, my plan was: I am concerned that despite negative TTE, he may have valve vegetations or other sources or arterial emboli from his previous MV replacement.  I will resume coumadin.  Spoke with Dr. Lissa Morales regarding arterial insufficiency who recommended getting him back in with Dr. Roxy Manns who performed the valve repair.  However, Dr. Roxy Manns is out of the office and his office recommended him seeing his cardiologist first to determine embolic source.  I will also arrange hand consult to determine when or if surgical resection is necessary. Start Coumadin 5 mg poqday and recheck INR Friday.    03/14/17 Saw Dr. Percival Spanish with cardiology who I appreciate working him in on short notice.  I have copied his recommendations below for reference:  suspect this might be embolic phenomenon and he is now back on anticoagulation. I don't think that further imaging for revascularization with angiography or other is indicated in that he has good radial pulses and this appears to be a distal phenomenon.  He has had no symptoms consistent with a vasospastic or vasculitic process.  He seems to be resolving without new lesions  He is here today to recheck INR.  Has been on coumadin 5 mg poqday (Today should be day 5).  However, he has actually only taken 5 mg a day for 2 days.  INR is 1.3.  At that time, my plan was: Continue Coumadin but resume previous dose that he was taking prior to his subarachnoid hemorrhage which was 2 mg every day and recheck INR in 2 weeks. I appreciate  cardiology's assistance. Meanwhile I will await the hand surgeons opinion about removing any nonviable debris. Today tips of his fingers actually look better than they did on Monday. Hopefully debridement will be unnecessary. There is no evidence of gangrene or cellulitis  03/27/17 Patient is here today unaccompanied. He states that he is taking his Coumadin although he cannot tell me what dose he states he. Previously 2 mg a day was enough to keep his INR therapeutic. However today his INR is 1.3 and is exactly the same as what it was 2 weeks ago despite the fact he is supposedly taking Coumadin 2 mg every day. I question given his memory is actually taking medication appropriately. The skin on his right index finger is much improved and has essentially healed. There is still some peeling skin but it is remarkably better than when I last seen him. The black  tissue has disappeared. On the tip of his left index finger there is an area approximately 7 mm in diameter with thick hyperkeratotic tissue with ulcer used to be however the remaining skin appears healthy and viable.  At that time, my plan was: The wounds on his fingers are healing nicely. I question the dose of Coumadin the patient has actually taking. For the time being and I have to believe that he is taking 2 mg a day and I will increase his Coumadin to 4 mg on Monday, Wednesday, Friday and take 2 mg on all other days and then repeat INR in 2 weeks. I wrote this down on a piece of paper for the patient and asked him to give this to his wife who manages his medication  04/08/17 Wounds on the patient's hands have essentially healed. However his INR today is 1.4 despite the fact that I increased his Coumadin last time from 14 mg a week to 20 mg a week (30% increase).  Patient is adamant that he is taking the Coumadin as I directed him to. I wrote down for the patient to take 4 mg on Monday, Wednesday, and Friday. He is to take 2 mg on all other days.  Patient reiterates that he is doing exactly what I said although he cannot specifically tell me how he takes his Coumadin. His wife manages his medication. However, for documentation purposes, his wife forgot her appointment today. Therefore I'm very concerned that the Coumadin is not being taken correctly due to miscommunication. Past Medical History:  Diagnosis Date  . Atelectasis of right lung   . Chronic diastolic (congestive) heart failure ([place])    a. 10/2013 Echo: EF 55-60%, basal-mid anteroseptal and basal-mid inferoseptal HK.  . Chronic fatigue   . Chronic subdural hematoma ([place])    a. 12/2015 Head CT: chronic right holo-hemispheric SDH w/o midline shift.  . Coronary artery disease    a. 11/2014 NSTEMI: DESx 2 placed to LAD and RCA; b. 08/2015 Cath: LAD patent stent, LCX 73m, RCA patent stent, 49m.  . Diabetes mellitus, type II ([place])    a. HgA1c 6.8 in 03/2015  . ED (erectile dysfunction)   . Gout   . Hemangioma of liver 08/02/2015   12 mm enhancing lesion seen on CT - suspicious for benign hemangioma but not diagnostic - MRI recommended  . HLD (hyperlipidemia)   . Hypertensive heart disease   . Kidney stones   . Lower extremity edema    a. chronic  . OA (osteoarthritis)   . Orthostatic hypotension   . PAF (paroxysmal atrial fibrillation) ([place])    a. 10/2015 s/p DCCV;  b. CHA2DS2VASc = 6--> on coumadin; c. 10/2015 Echo: sev dil LA.  Marland Kitchen Physical deconditioning   . Prostate cancer ([place])   . Pulmonary HTN ([place])   . Pulmonary hypertension ([place])    a. 10/2015 Echo: PASP 46mmHg.  Marland Kitchen Severe mitral regurgitation    a.  10/2015 s/p minimally invasive MV repair; b. 10/2015 Echo: mod MS, mean grad 31mmHg.  Marland Kitchen Spinal stenosis   . SVT (supraventricular tachycardia) ([facility])    a. recurrent, usually responsive to vagal manuevers  . Tricuspid regurgitation    a. 10/2015 Echo: mild to mod TR.   Past Surgical History:  Procedure Laterality Date  . BACK SURGERY    . Back surgery x 2    . CARDIAC  CATHETERIZATION N/A 08/08/2015   Procedure: Right/Left Heart Cath and Coronary Angiography;  Surgeon: Sherren Mocha, MD;  Location: [facility];  Service: Cardiovascular;  Laterality: N/A;  . CARDIAC SURGERY     2 cardiac stents  . CARDIOVERSION N/A 10/20/2015   Procedure: CARDIOVERSION;  Surgeon: Pixie Casino, MD;  Location: [facility];  Service: Cardiovascular;  Laterality: N/A;  . CERVICAL SPINE SURGERY    . CHOLECYSTECTOMY N/A 07/08/2016   Procedure: LAPAROSCOPIC CHOLECYSTECTOMY WITH INTRAOPERATIVE CHOLANGIOGRAM;  Surgeon: Georganna Skeans, MD;  Location: [facility];  Service: General;  Laterality: N/A;  . EYE SURGERY    . INTRAOPERATIVE TRANSESOPHAGEAL ECHOCARDIOGRAM  10/05/2015   Procedure: INTRAOPERATIVE TRANSESOPHAGEAL ECHOCARDIOGRAM;  Surgeon: Rexene Alberts, MD;  Location: [facility];  Service: Open Heart Surgery;;  . JOINT REPLACEMENT     shoulder  . LEFT HEART CATHETERIZATION WITH CORONARY ANGIOGRAM N/A 11/16/2014   Procedure: LEFT HEART CATHETERIZATION WITH CORONARY ANGIOGRAM;  Surgeon: Troy Sine, MD;  Location: [facility] C[facility];  Service: Cardiovascular;  Laterality: N/A;  . MITRAL VALVE REPAIR Right 10/04/2015   Procedure: MINIMALLY INVASIVE MITRAL VALVE REPAIR (MVR);  Surgeon: Rexene Alberts, MD;  Location: [place];  Service: Open Heart Surgery;  Laterality: Right;  . Rt rotator cuff repair    . TEE WITHOUT CARDIOVERSION N/A 06/22/2015   Procedure: TRANSESOPHAGEAL ECHOCARDIOGRAM (TEE);  Surgeon: Skeet Latch, MD;  Location: [place];  Service: Cardiovascular;  Laterality: N/A;  . TEE WITHOUT CARDIOVERSION N/A 10/04/2015   Procedure: TRANSESOPHAGEAL ECHOCARDIOGRAM (TEE);  Surgeon: Rexene Alberts, MD;  Location: [place];  Service: Open Heart Surgery;  Laterality: N/A;  . WOUND EXPLORATION N/A 10/05/2015   Procedure: Sternal Incision;  Surgeon: Rexene Alberts, MD;  Location: [place];  Service: Open Heart Surgery;  Laterality: N/A;   Current Outpatient Prescriptions on File Prior to  Visit  Medication Sig Dispense Refill  . allopurinol (ZYLOPRIM) 100 MG tablet TAKE ONE (1) TABLET EACH DAY 90 tablet 3  . colchicine 0.6 MG tablet Take 1 tablet (0.6 mg total) by mouth daily. 30 tablet 5  . finasteride (PROSCAR) 5 MG tablet TAKE ONE (1) TABLET BY MOUTH EVERY DAY 30 tablet 5  . furosemide (LASIX) 40 MG tablet Take 1 tablet (40 mg total) by mouth 2 (two) times daily. 60 tablet 5  . magnesium oxide (MAG-OX) 400 MG tablet Take 1 tablet (400 mg total) by mouth daily. 30 tablet 0  . metoprolol tartrate (LOPRESSOR) 25 MG tablet TAKE ONE-HALF TABLET BY MOUTH TWICE DAILY 30 tablet 5  . OVER THE COUNTER MEDICATION Neuroquell: Take 1 capsule by mouth once a day for immune health    . potassium chloride (KLOR-CON) 20 MEQ packet Take 20 mEq by mouth daily. 30 packet 0  . pravastatin (PRAVACHOL) 40 MG tablet Take 1 tablet (40 mg total) by mouth daily at 6 PM. 30 tablet 11  . traMADol (ULTRAM) 50 MG tablet Take 1 tablet (50 mg total) by mouth every 6 (six) hours as needed for moderate pain. 30 tablet 0  . warfarin (COUMADIN) 2 MG tablet Take 1 tablet (2 mg total) by mouth daily. 30 tablet 3  . Wheat Dextrin (BENEFIBER) POWD Take by mouth See admin instructions. Mix one teaspoonful into 6-8 ounces of fluid/milk and drink once daily     No current facility-administered medications on file prior to visit.    No Known Allergies Social History   Social History  . Marital status: Married    Spouse name: N/A  . Number of children: 4  . Years of education: N/A   Occupational History  .  Retired   Social History Main Topics  . Smoking status: Former Smoker    Types: Cigarettes, Pipe, Landscape architect  . Smokeless tobacco: Former Systems developer    Types: Chew     Comment: [place] YEARS AGO"  . Alcohol use No  . Drug use: No  . Sexual activity: Not on file   Other Topics Concern  . Not on file   Social History Narrative   ** Merged History Encounter **       Four adopted children.  Lives  alone.       Review of Systems  All other systems reviewed and are negative.      Objective:   Physical Exam  Constitutional: He appears well-developed and well-nourished. No distress.  Cardiovascular: Normal rate and intact distal pulses.  An irregularly irregular rhythm present.  Murmur heard. Pulmonary/Chest: Effort normal and breath sounds normal. No respiratory distress. He has no wheezes. He has no rales. He exhibits no tenderness.  Abdominal: Soft. Bowel sounds are normal. He exhibits no distension. There is no tenderness. There is no rebound and no guarding.  Musculoskeletal:       Hands: Skin: No rash noted. He is not diaphoretic. No erythema.          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 Patient was previously taking 2 mg a day and was achieving a therapeutic level INR in the past. I have increased his Coumadin today to 4 mg every day. I will recheck an INR in 2 weeks. If INR continues to remain subtherapeutic, due to the possible confusion and misunderstanding that this patient experiences, we may want to consider switching him to eliquis.  Obviously this is not studied or indicated for a valve replacement. However one can argue that it would be more beneficial to take a consistent standard dose at a therapeutic level rather than be on subtherapeutic Coumadin dosages. Reassess in 2 weeks.

## 2017-04-10 ENCOUNTER — Other Ambulatory Visit: Payer: Self-pay | Admitting: *Deleted

## 2017-04-10 MED ORDER — WARFARIN SODIUM 4 MG PO TABS: 4.0000 mg | ORAL_TABLET | Freq: Every day | ORAL | 1 refills | Status: DC

## 2017-04-22 ENCOUNTER — Ambulatory Visit (INDEPENDENT_AMBULATORY_CARE_PROVIDER_SITE_OTHER): Payer: Medicare Other | Admitting: Family Medicine

## 2017-04-22 ENCOUNTER — Encounter: Payer: Self-pay | Admitting: Family Medicine

## 2017-04-22 DIAGNOSIS — I749 Embolism and thrombosis of unspecified artery: Secondary | ICD-10-CM

## 2017-04-22 DIAGNOSIS — Z23 Encounter for immunization: Secondary | ICD-10-CM

## 2017-04-22 LAB — PT WITH INR/FINGERSTICK
INR, fingerstick: 1.9 ratio — ABNORMAL HIGH
PT, fingerstick: 23.2 s — ABNORMAL HIGH (ref 10.5–13.1)

## 2017-04-22 NOTE — Progress Notes (Signed)
Subjective:    Patient ID: [name], male    DOB: 06/20/1929, 83 y.o.   MRN: [id number]  HPI  02/24/17 Patient has a very complicated past medical history including coronary artery disease status post CABG as well as mitral valve replacement with a history of atrial fibrillation. Patient is not currently on any anticoagulation due to the fact he recently fell in April and suffered a subarachnoid hemorrhage. Because of the fall risk, he was deemed unsafe for chronic anticoagulation. Furthermore, the patient does not have an artificial valve. Patient states 1 week ago, he developed severe pain in the dorsum of his left hand suddenly. The following morning, a blister developed on his distal second finger tip on the finger pad. The blister ruptured and there is now a 1.5 cm ulcer on the tip of his left second digit down to the subcutaneous fat. Friday, 3 days ago, the patient develops similar pain in the dorsum of his right hand and the following morning, developed a blister on the tip of his right second digit on the finger pad. There is now a 1 cm ulcer down to the subcutaneous fat on the finger pad surface of the second digit on his right hand. Patient denies any injuries to the fingers. He denies any injury to the hand. This seems to represent some type of embolic infarction of the skin of the fingertips. Concern would be for cardiac source or possibly valve vegetation.  At that time, my plan was: The wounds were dressed with Silvadene, petroleum gauze, and wrapped in Coban. Recheck the wounds in 48 hours to assess for healing. My biggest concern is arterial embolization from a cardiac source. Therefore I'll obtain a complete echocardiogram of the heart. Given his age, I will avoid TEE and start with a basic echocardiogram because of the need for sedation. If echocardiogram is negative for valvular vegetation or embolic source, I will consult cardiology for a second opinion and turn our focus on wound  care. Patient may require arterial studies of the upper extremities and/or TEE.  02/27/17 Today for recheck. Echocardiogram is scheduled for next week.  Wound on the tip of his right index finger is much better. It is now 3 mm x 16 mm. Wound on the tip of his left index finger is essentially unchanged and is 13 mm in diameter. Apparently the dressing fell off of this and the patient was unable to redress the wound. There is no evidence of cellulitis or infection. There is no drainage.  At that time, my plan was: The wound on the tip of his right index finger is healing nicely. I again cover the wound with Silvadene, petroleum gauze and wrapped in Coban.  I suspect that the wound on the tip of his left index finger is slow to heal because the dressing came off. Therefore I reapplied the dressing coating the wound with Silvadene, wrapping it with petroleum gauze, and then wrapping the entire finger in Coban and. Recheck the wounds on Monday.  03/03/17 Wound on the tip of the right index finger has essentially healed. The wound on the tip of the left index finger is much better and is now 8 mm in diameter. It is slowly healing but there is no evidence of cellulitis. There is no drainage. His echocardiogram is scheduled for today.  At that time, my plan was: Wounds are healing nicely. I recommended that he leave the wound on his right hand open to the air to  allow the skin to toughen up and dry out but there is no residual ulcer at this point. I covered the tip of his left index pain with petroleum gauze and wrapped with Coban and a Band-Aid. Recheck this in one week but it is healing nicely. Because the skin is healing so well, I believe that arterial embolization is highly unlikely at this point. He has an echocardiogram today to assess for any signs of endocarditis. If echocardiogram is negative and wounds continue to heal, no further follow-up is necessary for this issue. However I'm not sure how the ulcers  developed initially  03/10/17 TTE revealed no valve vegetations or embolic sources.  However, right index finger is much worse.  Distal to pip joint is now bruised with sluggish cap refill, worsening skin ulceration with desquamation.  It is also tender to palpation distal to the pip joint.  This is much worse than 1 week ago when it was healing well suggesting new ischemic insult.  The left index finger is essentially unchanged.  At that time, my plan was: I am concerned that despite negative TTE, he may have valve vegetations or other sources or arterial emboli from his previous MV replacement.  I will resume coumadin.  Spoke with Dr. Lissa Morales regarding arterial insufficiency who recommended getting him back in with Dr. Roxy Manns who performed the valve repair.  However, Dr. Roxy Manns is out of the office and his office recommended him seeing his cardiologist first to determine embolic source.  I will also arrange hand consult to determine when or if surgical resection is necessary. Start Coumadin 5 mg poqday and recheck INR Friday.    03/14/17 Saw Dr. Percival Spanish with cardiology who I appreciate working him in on short notice.  I have copied his recommendations below for reference:  suspect this might be embolic phenomenon and he is now back on anticoagulation. I don't think that further imaging for revascularization with angiography or other is indicated in that he has good radial pulses and this appears to be a distal phenomenon.  He has had no symptoms consistent with a vasospastic or vasculitic process.  He seems to be resolving without new lesions  He is here today to recheck INR.  Has been on coumadin 5 mg poqday (Today should be day 5).  However, he has actually only taken 5 mg a day for 2 days.  INR is 1.3.  At that time, my plan was: Continue Coumadin but resume previous dose that he was taking prior to his subarachnoid hemorrhage which was 2 mg every day and recheck INR in 2 weeks. I appreciate  cardiology's assistance. Meanwhile I will await the hand surgeons opinion about removing any nonviable debris. Today tips of his fingers actually look better than they did on Monday. Hopefully debridement will be unnecessary. There is no evidence of gangrene or cellulitis  03/27/17 Patient is here today unaccompanied. He states that he is taking his Coumadin although he cannot tell me what dose he states he. Previously 2 mg a day was enough to keep his INR therapeutic. However today his INR is 1.3 and is exactly the same as what it was 2 weeks ago despite the fact he is supposedly taking Coumadin 2 mg every day. I question given his memory is actually taking medication appropriately. The skin on his right index finger is much improved and has essentially healed. There is still some peeling skin but it is remarkably better than when I last seen him. The black  tissue has disappeared. On the tip of his left index finger there is an area approximately 7 mm in diameter with thick hyperkeratotic tissue with ulcer used to be however the remaining skin appears healthy and viable.  At that time, my plan was: The wounds on his fingers are healing nicely. I question the dose of Coumadin the patient has actually taking. For the time being and I have to believe that he is taking 2 mg a day and I will increase his Coumadin to 4 mg on Monday, Wednesday, Friday and take 2 mg on all other days and then repeat INR in 2 weeks. I wrote this down on a piece of paper for the patient and asked him to give this to his wife who manages his medication  04/08/17 Wounds on the patient's hands have essentially healed. However his INR today is 1.4 despite the fact that I increased his Coumadin last time from 14 mg a week to 20 mg a week (30% increase).  Patient is adamant that he is taking the Coumadin as I directed him to. I wrote down for the patient to take 4 mg on Monday, Wednesday, and Friday. He is to take 2 mg on all other days.  Patient reiterates that he is doing exactly what I said although he cannot specifically tell me how he takes his Coumadin. His wife manages his medication. However, for documentation purposes, his wife forgot her appointment today. Therefore I'm very concerned that the Coumadin is not being taken correctly due to miscommunication.  At that time, my plan was: I am very concerned that the Coumadin is not being taken correctly. Patient was previously taking 2 mg a day and was achieving a therapeutic level INR in the past. I have increased his Coumadin today to 4 mg every day. I will recheck an INR in 2 weeks. If INR continues to remain subtherapeutic, due to the possible confusion and misunderstanding that this patient experiences, we may want to consider switching him to eliquis.  Obviously this is not studied or indicated for a valve replacement. However one can argue that it would be more beneficial to take a consistent standard dose at a therapeutic level rather than be on subtherapeutic Coumadin dosages. Reassess in 2 weeks.  04/22/17 Since I last saw the patient, he has fallen again. He was not using his cane when he lost balance and fell bruising his right ribs below his nipple. He denies any chest pain. He denies any shortness of breath. He denies any pleurisy although he does have some pain when he coughs. He is here today to recheck his INR. His INR is now up to 1.9. Given the patient's history of confusion and falls, I am comfortable leaving him at 1.9. Past Medical History:  Diagnosis Date  . Atelectasis of right lung   . Chronic diastolic (congestive) heart failure ([place])    a. 10/2013 Echo: EF 55-60%, basal-mid anteroseptal and basal-mid inferoseptal HK.  . Chronic fatigue   . Chronic subdural hematoma ([place])    a. 12/2015 Head CT: chronic right holo-hemispheric SDH w/o midline shift.  . Coronary artery disease    a. 11/2014 NSTEMI: DESx 2 placed to LAD and RCA; b. 08/2015 Cath: LAD patent stent,  LCX 37m, RCA patent stent, 55m.  . Diabetes mellitus, type II ([place])    a. HgA1c 6.8 in 03/2015  . ED (erectile dysfunction)   . Gout   . Hemangioma of liver 08/02/2015   12 mm enhancing  lesion seen on CT - suspicious for benign hemangioma but not diagnostic - MRI recommended  . HLD (hyperlipidemia)   . Hypertensive heart disease   . Kidney stones   . Lower extremity edema    a. chronic  . OA (osteoarthritis)   . Orthostatic hypotension   . PAF (paroxysmal atrial fibrillation) ([place])    a. 10/2015 s/p DCCV;  b. CHA2DS2VASc = 6--> on coumadin; c. 10/2015 Echo: sev dil LA.  Marland Kitchen Physical deconditioning   . Prostate cancer ([place])   . Pulmonary HTN ([place])   . Pulmonary hypertension ([place])    a. 10/2015 Echo: PASP 81mmHg.  Marland Kitchen Severe mitral regurgitation    a.  10/2015 s/p minimally invasive MV repair; b. 10/2015 Echo: mod MS, mean grad 72mmHg.  Marland Kitchen Spinal stenosis   . SVT (supraventricular tachycardia) ([facility])    a. recurrent, usually responsive to vagal manuevers  . Tricuspid regurgitation    a. 10/2015 Echo: mild to mod TR.   Past Surgical History:  Procedure Laterality Date  . BACK SURGERY    . Back surgery x 2    . CARDIAC CATHETERIZATION N/A 08/08/2015   Procedure: Right/Left Heart Cath and Coronary Angiography;  Surgeon: Sherren Mocha, MD;  Location: [facility];  Service: Cardiovascular;  Laterality: N/A;  . CARDIAC SURGERY     2 cardiac stents  . CARDIOVERSION N/A 10/20/2015   Procedure: CARDIOVERSION;  Surgeon: Pixie Casino, MD;  Location: [facility];  Service: Cardiovascular;  Laterality: N/A;  . CERVICAL SPINE SURGERY    . CHOLECYSTECTOMY N/A 07/08/2016   Procedure: LAPAROSCOPIC CHOLECYSTECTOMY WITH INTRAOPERATIVE CHOLANGIOGRAM;  Surgeon: Georganna Skeans, MD;  Location: [facility];  Service: General;  Laterality: N/A;  . EYE SURGERY    . INTRAOPERATIVE TRANSESOPHAGEAL ECHOCARDIOGRAM  10/05/2015   Procedure: INTRAOPERATIVE TRANSESOPHAGEAL ECHOCARDIOGRAM;  Surgeon: Rexene Alberts, MD;   Location: [facility];  Service: Open Heart Surgery;;  . JOINT REPLACEMENT     shoulder  . LEFT HEART CATHETERIZATION WITH CORONARY ANGIOGRAM N/A 11/16/2014   Procedure: LEFT HEART CATHETERIZATION WITH CORONARY ANGIOGRAM;  Surgeon: Troy Sine, MD;  Location: [facility] CATH [facility];  Service: Cardiovascular;  Laterality: N/A;  . MITRAL VALVE REPAIR Right 10/04/2015   Procedure: MINIMALLY INVASIVE MITRAL VALVE REPAIR (MVR);  Surgeon: Rexene Alberts, MD;  Location: [facility];  Service: Open Heart Surgery;  Laterality: Right;  . Rt rotator cuff repair    . TEE WITHOUT CARDIOVERSION N/A 06/22/2015   Procedure: TRANSESOPHAGEAL ECHOCARDIOGRAM (TEE);  Surgeon: Skeet Latch, MD;  Location: [facility];  Service: Cardiovascular;  Laterality: N/A;  . TEE WITHOUT CARDIOVERSION N/A 10/04/2015   Procedure: TRANSESOPHAGEAL ECHOCARDIOGRAM (TEE);  Surgeon: Rexene Alberts, MD;  Location: [facility];  Service: Open Heart Surgery;  Laterality: N/A;  . WOUND EXPLORATION N/A 10/05/2015   Procedure: Sternal Incision;  Surgeon: Rexene Alberts, MD;  Location: [facility];  Service: Open Heart Surgery;  Laterality: N/A;   Current Outpatient Prescriptions on File Prior to Visit  Medication Sig Dispense Refill  . allopurinol (ZYLOPRIM) 100 MG tablet TAKE ONE (1) TABLET EACH DAY 90 tablet 3  . colchicine 0.6 MG tablet Take 1 tablet (0.6 mg total) by mouth daily. 30 tablet 5  . finasteride (PROSCAR) 5 MG tablet TAKE ONE (1) TABLET BY MOUTH EVERY DAY 30 tablet 5  . furosemide (LASIX) 40 MG tablet Take 1 tablet (40 mg total) by mouth 2 (two) times daily. 60 tablet 5  . magnesium oxide (MAG-OX) 400 MG tablet  Take 1 tablet (400 mg total) by mouth daily. 30 tablet 0  . metoprolol tartrate (LOPRESSOR) 25 MG tablet TAKE ONE-HALF TABLET BY MOUTH TWICE DAILY 30 tablet 5  . OVER THE COUNTER MEDICATION Neuroquell: Take 1 capsule by mouth once a day for immune health    . potassium chloride (KLOR-CON) 20 MEQ packet Take 20 mEq by mouth daily. 30 packet 0  .  pravastatin (PRAVACHOL) 40 MG tablet Take 1 tablet (40 mg total) by mouth daily at 6 PM. 30 tablet 11  . traMADol (ULTRAM) 50 MG tablet Take 1 tablet (50 mg total) by mouth every 6 (six) hours as needed for moderate pain. 30 tablet 0  . warfarin (COUMADIN) 4 MG tablet Take 1 tablet (4 mg total) by mouth daily. 30 tablet 1  . Wheat Dextrin (BENEFIBER) POWD Take by mouth See admin instructions. Mix one teaspoonful into 6-8 ounces of fluid/milk and drink once daily     No current facility-administered medications on file prior to visit.    No Known Allergies Social History   Social History  . Marital status: Married    Spouse name: N/A  . Number of children: 4  . Years of education: N/A   Occupational History  .  Retired   Social History Main Topics  . Smoking status: Former Smoker    Types: Cigarettes, Pipe, Landscape architect  . Smokeless tobacco: Former Systems developer    Types: Chew     Comment: [place] YEARS AGO"  . Alcohol use No  . Drug use: No  . Sexual activity: Not on file   Other Topics Concern  . Not on file   Social History Narrative   ** Merged History Encounter **       Four adopted children.  Lives alone.       Review of Systems  All other systems reviewed and are negative.      Objective:   Physical Exam  Constitutional: He appears well-developed and well-nourished. No distress.  Cardiovascular: Normal rate and intact distal pulses.  An irregularly irregular rhythm present.  Murmur heard. Pulmonary/Chest: Effort normal and breath sounds normal. No respiratory distress. He has no wheezes. He has no rales. He exhibits no tenderness.  Abdominal: He exhibits no distension.    Skin: No rash noted. He is not diaphoretic. No erythema.          Assessment & Plan:  Arterial embolism and thrombosis ([place]) - Plan: PT with INR/Fingerstick  Need for immunization against influenza - Plan: Flu Vaccine QUAD 36+ mos IM INR today is 1.9. I believe this is acceptable.  Continue Coumadin 4 mg a day and recheck INR in 4 weeks. Strongly encouraged the patient to use a cane or walker at all times. Lungs are clear. I believe the patient simply suffered a chest wall bruise. At the present time, I do not believe he broke a rib. There are no apparent complications.

## 2017-04-25 ENCOUNTER — Ambulatory Visit: Payer: Medicare Other | Admitting: Cardiology

## 2017-05-02 ENCOUNTER — Telehealth: Payer: Self-pay | Admitting: Cardiology

## 2017-05-02 NOTE — Telephone Encounter (Signed)
Left a message to call back.

## 2017-05-02 NOTE — Telephone Encounter (Signed)
New message     Pt c/o swelling: STAT is pt has developed SOB within 24 hours  How much weight have you gained and in what time span? Gained 16.6 lbs in 28 days 1) If swelling, where is the swelling located? Feet/ankles  2) Are you currently taking a fluid pill? Yes---took extra lasix wed and thurs  Are you currently SOB? no Do you have a log of your daily weights (if so, list)?  Nurse is faxing log to Dr Percival Spanish Have you gained 3 pounds in a day or 5 pounds in a week?   Have you traveled recently? Has not traveled recently but is leaving today or tomorrow to go to [place], [place].  Nurse request we call pt at [phone] or [phone] because she will not be near her phone

## 2017-05-06 NOTE — Telephone Encounter (Signed)
Returned call to Dawayne Patricia, spouse (DPR) she staes that pt is doing well today with no additional swelling in the feet/ankles and will not need the extra lasix today will continue as directed BID with daily weights and will call should she need further direction.

## 2017-05-14 ENCOUNTER — Telehealth: Payer: Self-pay

## 2017-05-22 ENCOUNTER — Encounter: Payer: Self-pay | Admitting: Family Medicine

## 2017-05-22 ENCOUNTER — Other Ambulatory Visit: Payer: Medicare Other

## 2017-05-22 ENCOUNTER — Ambulatory Visit (INDEPENDENT_AMBULATORY_CARE_PROVIDER_SITE_OTHER): Payer: Medicare Other | Admitting: Family Medicine

## 2017-05-22 VITALS — BP 146/80 | HR 74 | Temp 97.8°F | Resp 18 | Ht 67.0 in | Wt 220.0 lb

## 2017-05-22 DIAGNOSIS — I749 Embolism and thrombosis of unspecified artery: Secondary | ICD-10-CM | POA: Diagnosis not present

## 2017-05-22 DIAGNOSIS — I5021 Acute systolic (congestive) heart failure: Secondary | ICD-10-CM | POA: Diagnosis not present

## 2017-05-22 LAB — PT WITH INR/FINGERSTICK
INR, fingerstick: 2.1 ratio — ABNORMAL HIGH
PT, fingerstick: 25.6 s — ABNORMAL HIGH (ref 10.5–13.1)

## 2017-05-22 MED ORDER — POTASSIUM CHLORIDE 20 MEQ PO PACK: 40.0000 meq | PACK | Freq: Every day | ORAL | 0 refills | Status: DC

## 2017-05-22 MED ORDER — FUROSEMIDE 40 MG PO TABS: 80.0000 mg | ORAL_TABLET | Freq: Every day | ORAL | 0 refills | Status: DC

## 2017-05-22 NOTE — Telephone Encounter (Signed)
Patient had left a message on 10/10. When I spoke with patient on 10/10  he no longer needed assistance.

## 2017-05-22 NOTE — Progress Notes (Signed)
Subjective:    Patient ID: [name], male    DOB: July 16, 1929, 83 y.o.   MRN: [id number]  HPI Patient walked in today complaining of swelling in his legs. He also wants his INR checked. INR is therapeutic. Since his last office visit, he is gained 13 pounds. He is supposed to be on Lasix 40 mg by mouth twice a day and potassium 20 mg once by mouth daily. However the patient is a very difficult historian. It is hard to pin down exactly what he has been taking. I do know that 2 days ago, he took 3 tablets of Lasix and yesterday he took 3 tablets of Lasix although he will not specifically say how he took them. He has bibasilar crackles in both lungs and he has +1 pitting edema in both legs to his knees. Past Medical History:  Diagnosis Date  . Atelectasis of right lung   . Chronic diastolic (congestive) heart failure ([place])    a. 10/2013 Echo: EF 55-60%, basal-mid anteroseptal and basal-mid inferoseptal HK.  . Chronic fatigue   . Chronic subdural hematoma ([place])    a. 12/2015 Head CT: chronic right holo-hemispheric SDH w/o midline shift.  . Coronary artery disease    a. 11/2014 NSTEMI: DESx 2 placed to LAD and RCA; b. 08/2015 Cath: LAD patent stent, LCX 44m, RCA patent stent, 53m.  . Diabetes mellitus, type II ([place])    a. HgA1c 6.8 in 03/2015  . ED (erectile dysfunction)   . Gout   . Hemangioma of liver 08/02/2015   12 mm enhancing lesion seen on CT - suspicious for benign hemangioma but not diagnostic - MRI recommended  . HLD (hyperlipidemia)   . Hypertensive heart disease   . Kidney stones   . Lower extremity edema    a. chronic  . OA (osteoarthritis)   . Orthostatic hypotension   . PAF (paroxysmal atrial fibrillation) ([place])    a. 10/2015 s/p DCCV;  b. CHA2DS2VASc = 6--> on coumadin; c. 10/2015 Echo: sev dil LA.  Marland Kitchen Physical deconditioning   . Prostate cancer ([place])   . Pulmonary HTN ([place])   . Pulmonary hypertension ([place])    a. 10/2015 Echo: PASP 38mmHg.  Marland Kitchen Severe mitral regurgitation     a.  10/2015 s/p minimally invasive MV repair; b. 10/2015 Echo: mod MS, mean grad 67mmHg.  Marland Kitchen Spinal stenosis   . SVT (supraventricular tachycardia) (HCC)    a. recurrent, usually responsive to vagal manuevers  . Tricuspid regurgitation    a. 10/2015 Echo: mild to mod TR.   Past Surgical History:  Procedure Laterality Date  . BACK SURGERY    . Back surgery x 2    . CARDIAC CATHETERIZATION N/A 08/08/2015   Procedure: Right/Left Heart Cath and Coronary Angiography;  Surgeon: Sherren Mocha, MD;  Location: [facility];  Service: Cardiovascular;  Laterality: N/A;  . CARDIAC SURGERY     2 cardiac stents  . CARDIOVERSION N/A 10/20/2015   Procedure: CARDIOVERSION;  Surgeon: Pixie Casino, MD;  Location: [place];  Service: Cardiovascular;  Laterality: N/A;  . CERVICAL SPINE SURGERY    . CHOLECYSTECTOMY N/A 07/08/2016   Procedure: LAPAROSCOPIC CHOLECYSTECTOMY WITH INTRAOPERATIVE CHOLANGIOGRAM;  Surgeon: Georganna Skeans, MD;  Location: [facility];  Service: General;  Laterality: N/A;  . EYE SURGERY    . INTRAOPERATIVE TRANSESOPHAGEAL ECHOCARDIOGRAM  10/05/2015   Procedure: INTRAOPERATIVE TRANSESOPHAGEAL ECHOCARDIOGRAM;  Surgeon: Rexene Alberts, MD;  Location: [facility];  Service: Open Heart Surgery;;  .  JOINT REPLACEMENT     shoulder  . LEFT HEART CATHETERIZATION WITH CORONARY ANGIOGRAM N/A 11/16/2014   Procedure: LEFT HEART CATHETERIZATION WITH CORONARY ANGIOGRAM;  Surgeon: Troy Sine, MD;  Location: [facility] CATH LAB;  Service: Cardiovascular;  Laterality: N/A;  . MITRAL VALVE REPAIR Right 10/04/2015   Procedure: MINIMALLY INVASIVE MITRAL VALVE REPAIR (MVR);  Surgeon: Rexene Alberts, MD;  Location: [facility];  Service: Open Heart Surgery;  Laterality: Right;  . Rt rotator cuff repair    . TEE WITHOUT CARDIOVERSION N/A 06/22/2015   Procedure: TRANSESOPHAGEAL ECHOCARDIOGRAM (TEE);  Surgeon: Skeet Latch, MD;  Location: [place];  Service: Cardiovascular;  Laterality: N/A;  . TEE WITHOUT  CARDIOVERSION N/A 10/04/2015   Procedure: TRANSESOPHAGEAL ECHOCARDIOGRAM (TEE);  Surgeon: Rexene Alberts, MD;  Location: [place];  Service: Open Heart Surgery;  Laterality: N/A;  . WOUND EXPLORATION N/A 10/05/2015   Procedure: Sternal Incision;  Surgeon: Rexene Alberts, MD;  Location: [place];  Service: Open Heart Surgery;  Laterality: N/A;   Current Outpatient Prescriptions on File Prior to Visit  Medication Sig Dispense Refill  . allopurinol (ZYLOPRIM) 100 MG tablet TAKE ONE (1) TABLET EACH DAY 90 tablet 3  . colchicine 0.6 MG tablet Take 1 tablet (0.6 mg total) by mouth daily. 30 tablet 5  . finasteride (PROSCAR) 5 MG tablet TAKE ONE (1) TABLET BY MOUTH EVERY DAY 30 tablet 5  . furosemide (LASIX) 40 MG tablet Take 1 tablet (40 mg total) by mouth 2 (two) times daily. 60 tablet 5  . magnesium oxide (MAG-OX) 400 MG tablet Take 1 tablet (400 mg total) by mouth daily. 30 tablet 0  . metoprolol tartrate (LOPRESSOR) 25 MG tablet TAKE ONE-HALF TABLET BY MOUTH TWICE DAILY 30 tablet 5  . OVER THE COUNTER MEDICATION Neuroquell: Take 1 capsule by mouth once a day for immune health    . pravastatin (PRAVACHOL) 40 MG tablet Take 1 tablet (40 mg total) by mouth daily at 6 PM. 30 tablet 11  . traMADol (ULTRAM) 50 MG tablet Take 1 tablet (50 mg total) by mouth every 6 (six) hours as needed for moderate pain. 30 tablet 0  . warfarin (COUMADIN) 4 MG tablet Take 1 tablet (4 mg total) by mouth daily. 30 tablet 1  . Wheat Dextrin (BENEFIBER) POWD Take by mouth See admin instructions. Mix one teaspoonful into 6-8 ounces of fluid/milk and drink once daily     No current facility-administered medications on file prior to visit.    No Known Allergies Social History   Social History  . Marital status: Married    Spouse name: N/A  . Number of children: 4  . Years of education: N/A   Occupational History  .  Retired   Social History Main Topics  . Smoking status: Former Smoker    Types: Cigarettes, Pipe, Landscape architect    . Smokeless tobacco: Former Systems developer    Types: Chew     Comment: [place] YEARS AGO"  . Alcohol use No  . Drug use: No  . Sexual activity: Not on file   Other Topics Concern  . Not on file   Social History Narrative   ** Merged History Encounter **       Four adopted children.  Lives alone.       Review of Systems  All other systems reviewed and are negative.      Objective:   Physical Exam  Constitutional: He appears well-developed and well-nourished. No distress.  Neck: No JVD present.  Cardiovascular: Normal rate.   Murmur heard. Pulmonary/Chest: Effort normal. He has rales.  Abdominal: Soft. Bowel sounds are normal. He exhibits no distension. There is no tenderness. There is no rebound and no guarding.  Musculoskeletal: He exhibits edema.  Skin: He is not diaphoretic.  Vitals reviewed.         Assessment & Plan:  Acute systolic congestive heart failure (HCC) - Plan: furosemide (LASIX) 40 MG tablet, potassium chloride (KLOR-CON) 20 MEQ packet, BASIC METABOLIC PANEL WITH GFR, Brain natriuretic peptide  Pulse oximetry is 97% on room air. He denies any chest pain but he does report dyspnea on exertion. He appears fluid overloaded on exam. I will increase his Lasix to 80 mg twice a day and I'll increase his potassium to 40 mEq a day and recheck the patient on Monday. I will also obtain a BMP and a BNP. Seek medical attention immediately should his symptoms worsen

## 2017-05-23 LAB — BASIC METABOLIC PANEL WITH GFR
BUN: 21 mg/dL (ref 7–25)
CALCIUM: 8.5 mg/dL — AB (ref 8.6–10.3)
CO2: 26 mmol/L (ref 20–32)
CREATININE: 1.11 mg/dL (ref 0.70–1.11)
Chloride: 100 mmol/L (ref 98–110)
GFR, EST AFRICAN AMERICAN: 69 mL/min/{1.73_m2} (ref >=60)
GFR, EST NON AFRICAN AMERICAN: 60 mL/min/{1.73_m2} (ref >=60)
Glucose, Bld: 147 mg/dL — ABNORMAL HIGH (ref 65–99)
Potassium: 4 mmol/L (ref 3.5–5.3)
Sodium: 135 mmol/L (ref 135–146)

## 2017-05-23 LAB — BRAIN NATRIURETIC PEPTIDE: Brain Natriuretic Peptide: 839 pg/mL — ABNORMAL HIGH (ref <100)

## 2017-05-26 ENCOUNTER — Ambulatory Visit (INDEPENDENT_AMBULATORY_CARE_PROVIDER_SITE_OTHER): Payer: Medicare Other | Admitting: Family Medicine

## 2017-05-26 ENCOUNTER — Encounter: Payer: Self-pay | Admitting: Family Medicine

## 2017-05-26 VITALS — BP 118/60 | HR 60 | Temp 97.9°F | Resp 20 | Ht 67.0 in | Wt 224.0 lb

## 2017-05-26 DIAGNOSIS — I5021 Acute systolic (congestive) heart failure: Secondary | ICD-10-CM | POA: Diagnosis not present

## 2017-05-26 NOTE — Progress Notes (Signed)
Subjective:    Patient ID: [name], male    DOB: 1928/11/21, 83 y.o.   MRN: [id number]  HPI  05/22/17 Patient walked in today complaining of swelling in his legs. He also wants his INR checked. INR is therapeutic. Since his last office visit, he is gained 13 pounds. He is supposed to be on Lasix 40 mg by mouth twice a day and potassium 20 mg once by mouth daily. However the patient is a very difficult historian. It is hard to pin down exactly what he has been taking. I do know that 2 days ago, he took 3 tablets of Lasix and yesterday he took 3 tablets of Lasix although he will not specifically say how he took them. He has bibasilar crackles in both lungs and he has +1 pitting edema in both legs to his knees.  At that time, ,my plan was: Pulse oximetry is 97% on room air. He denies any chest pain but he does report dyspnea on exertion. He appears fluid overloaded on exam. I will increase his Lasix to 80 mg twice a day and I'll increase his potassium to 40 mEq a day and recheck the patient on Monday. I will also obtain a BMP and a BNP. Seek medical attention immediately should his symptoms worsen  05/26/17 The patient's BNP was elevated greater than 800.  He states that he has been compliant with his medication and is taking 80 mg twice a day of the Lasix.  However he has gained 4 additional pounds since his last visit.  The edema in his legs is actually worse.  He continues to have bibasilar crackles in both lungs.  He does continue to report shortness of breath with exertion but continues to deny any orthopnea or paroxysmal nocturnal dyspnea.  He also denies chest pain. Past Medical History:  Diagnosis Date  . Atelectasis of right lung   . Chronic diastolic (congestive) heart failure ([place])    a. 10/2013 Echo: EF 55-60%, basal-mid anteroseptal and basal-mid inferoseptal HK.  . Chronic fatigue   . Chronic subdural hematoma ([place])    a. 12/2015 Head CT: chronic right holo-hemispheric SDH w/o  midline shift.  . Coronary artery disease    a. 11/2014 NSTEMI: DESx 2 placed to LAD and RCA; b. 08/2015 Cath: LAD patent stent, LCX 23m, RCA patent stent, 69m.  . Diabetes mellitus, type II ([place])    a. HgA1c 6.8 in 03/2015  . ED (erectile dysfunction)   . Gout   . Hemangioma of liver 08/02/2015   12 mm enhancing lesion seen on CT - suspicious for benign hemangioma but not diagnostic - MRI recommended  . HLD (hyperlipidemia)   . Hypertensive heart disease   . Kidney stones   . Lower extremity edema    a. chronic  . OA (osteoarthritis)   . Orthostatic hypotension   . PAF (paroxysmal atrial fibrillation) ([place])    a. 10/2015 s/p DCCV;  b. CHA2DS2VASc = 6--> on coumadin; c. 10/2015 Echo: sev dil LA.  Marland Kitchen Physical deconditioning   . Prostate cancer ([place])   . Pulmonary HTN ([place])   . Pulmonary hypertension ([place])    a. 10/2015 Echo: PASP 29mmHg.  Marland Kitchen Severe mitral regurgitation    a.  10/2015 s/p minimally invasive MV repair; b. 10/2015 Echo: mod MS, mean grad 17mmHg.  Marland Kitchen Spinal stenosis   . SVT (supraventricular tachycardia) (HCC)    a. recurrent, usually responsive to vagal manuevers  . Tricuspid regurgitation  a. 10/2015 Echo: mild to mod TR.   Past Surgical History:  Procedure Laterality Date  . BACK SURGERY    . Back surgery x 2    . CARDIAC CATHETERIZATION N/A 08/08/2015   Procedure: Right/Left Heart Cath and Coronary Angiography;  Surgeon: Sherren Mocha, MD;  Location: [facility] [facility];  Service: Cardiovascular;  Laterality: N/A;  . CARDIAC SURGERY     2 cardiac stents  . CARDIOVERSION N/A 10/20/2015   Procedure: CARDIOVERSION;  Surgeon: Pixie Casino, MD;  Location: [facility] [place];  Service: Cardiovascular;  Laterality: N/A;  . CERVICAL SPINE SURGERY    . CHOLECYSTECTOMY N/A 07/08/2016   Procedure: LAPAROSCOPIC CHOLECYSTECTOMY WITH INTRAOPERATIVE CHOLANGIOGRAM;  Surgeon: Georganna Skeans, MD;  Location: [place] [facility];  Service: General;  Laterality: N/A;  . EYE SURGERY    . INTRAOPERATIVE  TRANSESOPHAGEAL ECHOCARDIOGRAM  10/05/2015   Procedure: INTRAOPERATIVE TRANSESOPHAGEAL ECHOCARDIOGRAM;  Surgeon: Rexene Alberts, MD;  Location: [facility] OR;  Service: Open Heart Surgery;;  . JOINT REPLACEMENT     shoulder  . LEFT HEART CATHETERIZATION WITH CORONARY ANGIOGRAM N/A 11/16/2014   Procedure: LEFT HEART CATHETERIZATION WITH CORONARY ANGIOGRAM;  Surgeon: Troy Sine, MD;  Location: [facility] CATH LAB;  Service: Cardiovascular;  Laterality: N/A;  . MITRAL VALVE REPAIR Right 10/04/2015   Procedure: MINIMALLY INVASIVE MITRAL VALVE REPAIR (MVR);  Surgeon: Rexene Alberts, MD;  Location: [place];  Service: Open Heart Surgery;  Laterality: Right;  . Rt rotator cuff repair    . TEE WITHOUT CARDIOVERSION N/A 06/22/2015   Procedure: TRANSESOPHAGEAL ECHOCARDIOGRAM (TEE);  Surgeon: Skeet Latch, MD;  Location: [place];  Service: Cardiovascular;  Laterality: N/A;  . TEE WITHOUT CARDIOVERSION N/A 10/04/2015   Procedure: TRANSESOPHAGEAL ECHOCARDIOGRAM (TEE);  Surgeon: Rexene Alberts, MD;  Location: [place];  Service: Open Heart Surgery;  Laterality: N/A;  . WOUND EXPLORATION N/A 10/05/2015   Procedure: Sternal Incision;  Surgeon: Rexene Alberts, MD;  Location: [place];  Service: Open Heart Surgery;  Laterality: N/A;   Current Outpatient Prescriptions on File Prior to Visit  Medication Sig Dispense Refill  . allopurinol (ZYLOPRIM) 100 MG tablet TAKE ONE (1) TABLET EACH DAY 90 tablet 3  . colchicine 0.6 MG tablet Take 1 tablet (0.6 mg total) by mouth daily. 30 tablet 5  . finasteride (PROSCAR) 5 MG tablet TAKE ONE (1) TABLET BY MOUTH EVERY DAY 30 tablet 5  . furosemide (LASIX) 40 MG tablet Take 2 tablets (80 mg total) by mouth daily. 120 tablet 0  . magnesium oxide (MAG-OX) 400 MG tablet Take 1 tablet (400 mg total) by mouth daily. 30 tablet 0  . metoprolol tartrate (LOPRESSOR) 25 MG tablet TAKE ONE-HALF TABLET BY MOUTH TWICE DAILY 30 tablet 5  . OVER THE COUNTER MEDICATION Neuroquell: Take 1 capsule by mouth  once a day for immune health    . potassium chloride (KLOR-CON) 20 MEQ packet Take 40 mEq by mouth daily. 60 packet 0  . pravastatin (PRAVACHOL) 40 MG tablet Take 1 tablet (40 mg total) by mouth daily at 6 PM. 30 tablet 11  . traMADol (ULTRAM) 50 MG tablet Take 1 tablet (50 mg total) by mouth every 6 (six) hours as needed for moderate pain. 30 tablet 0  . warfarin (COUMADIN) 4 MG tablet Take 1 tablet (4 mg total) by mouth daily. 30 tablet 1  . Wheat Dextrin (BENEFIBER) POWD Take by mouth See admin instructions. Mix one teaspoonful into 6-8 ounces of fluid/milk and drink once daily    .  furosemide (LASIX) 40 MG tablet Take 1 tablet (40 mg total) by mouth 2 (two) times daily. (Patient not taking: Reported on 05/26/2017) 60 tablet 5   No current facility-administered medications on file prior to visit.    No Known Allergies Social History   Social History  . Marital status: Married    Spouse name: N/A  . Number of children: 4  . Years of education: N/A   Occupational History  .  Retired   Social History Main Topics  . Smoking status: Former Smoker    Types: Cigarettes, Pipe, Landscape architect  . Smokeless tobacco: Former Systems developer    Types: Chew     Comment: [place] YEARS AGO"  . Alcohol use No  . Drug use: No  . Sexual activity: Not on file   Other Topics Concern  . Not on file   Social History Narrative   ** Merged History Encounter **       Four adopted children.  Lives alone.       Review of Systems  All other systems reviewed and are negative.      Objective:   Physical Exam  Constitutional: He appears well-developed and well-nourished. No distress.  Neck: No JVD present.  Cardiovascular: Normal rate.   Murmur heard. Pulmonary/Chest: Effort normal. He has rales.  Abdominal: Soft. Bowel sounds are normal. He exhibits no distension. There is no tenderness. There is no rebound and no guarding.  Musculoskeletal: He exhibits edema.  Skin: He is not diaphoretic.    Vitals reviewed.         Assessment & Plan:  Acute systolic congestive heart failure ([place])  I have to believe that the patient is compliant taking his diuretic.  Therefore I will increase Lasix to 120 mg p.o. twice daily.  I will continue him on the present dose of potassium at 40 mEq a day.  I will recheck the patient on Thursday or sooner if worse.  Is to go to the emergency room if worsening

## 2017-05-29 ENCOUNTER — Encounter: Payer: Self-pay | Admitting: Family Medicine

## 2017-05-29 ENCOUNTER — Ambulatory Visit (INDEPENDENT_AMBULATORY_CARE_PROVIDER_SITE_OTHER): Payer: Medicare Other | Admitting: Family Medicine

## 2017-05-29 VITALS — BP 118/78 | HR 68 | Temp 97.6°F | Resp 18 | Ht 67.0 in | Wt 218.0 lb

## 2017-05-29 DIAGNOSIS — I5021 Acute systolic (congestive) heart failure: Secondary | ICD-10-CM | POA: Diagnosis not present

## 2017-05-29 NOTE — Progress Notes (Signed)
Subjective:    Patient ID: [name], male    DOB: 09/09/1928, 83 y.o.   MRN: [id number]  HPI Please see the previous office visit.   patient is currently taking Lasix 120 mg p.o. twice daily in addition to potassium chloride 40 mEq a day.  At his last visit, the patient weighed 224 pounds.  He has diuresed some and his weight is down to 218 pounds today although he still has +1-2 pitting edema in his right leg and +1 pitting edema in his left leg.  He also has faint bibasilar crackles and continues to endorse mild shortness of breath with exertion.   Past Medical History:  Diagnosis Date  . Atelectasis of right lung   . Chronic diastolic (congestive) heart failure ([place])    a. 10/2013 Echo: EF 55-60%, basal-mid anteroseptal and basal-mid inferoseptal HK.  . Chronic fatigue   . Chronic subdural hematoma ([place])    a. 12/2015 Head CT: chronic right holo-hemispheric SDH w/o midline shift.  . Coronary artery disease    a. 11/2014 NSTEMI: DESx 2 placed to LAD and RCA; b. 08/2015 Cath: LAD patent stent, LCX 25m, RCA patent stent, 58m.  . Diabetes mellitus, type II ([place])    a. HgA1c 6.8 in 03/2015  . ED (erectile dysfunction)   . Gout   . Hemangioma of liver 08/02/2015   12 mm enhancing lesion seen on CT - suspicious for benign hemangioma but not diagnostic - MRI recommended  . HLD (hyperlipidemia)   . Hypertensive heart disease   . Kidney stones   . Lower extremity edema    a. chronic  . OA (osteoarthritis)   . Orthostatic hypotension   . PAF (paroxysmal atrial fibrillation) ([place])    a. 10/2015 s/p DCCV;  b. CHA2DS2VASc = 6--> on coumadin; c. 10/2015 Echo: sev dil LA.  Marland Kitchen Physical deconditioning   . Prostate cancer ([place])   . Pulmonary HTN ([place])   . Pulmonary hypertension ([place])    a. 10/2015 Echo: PASP 82mmHg.  Marland Kitchen Severe mitral regurgitation    a.  10/2015 s/p minimally invasive MV repair; b. 10/2015 Echo: mod MS, mean grad 60mmHg.  Marland Kitchen Spinal stenosis   . SVT (supraventricular tachycardia)  (HCC)    a. recurrent, usually responsive to vagal manuevers  . Tricuspid regurgitation    a. 10/2015 Echo: mild to mod TR.   Past Surgical History:  Procedure Laterality Date  . BACK SURGERY    . Back surgery x 2    . CARDIAC CATHETERIZATION N/A 08/08/2015   Procedure: Right/Left Heart Cath and Coronary Angiography;  Surgeon: Sherren Mocha, MD;  Location: [place] [facility];  Service: Cardiovascular;  Laterality: N/A;  . CARDIAC SURGERY     2 cardiac stents  . CARDIOVERSION N/A 10/20/2015   Procedure: CARDIOVERSION;  Surgeon: Pixie Casino, MD;  Location: [facility];  Service: Cardiovascular;  Laterality: N/A;  . CERVICAL SPINE SURGERY    . CHOLECYSTECTOMY N/A 07/08/2016   Procedure: LAPAROSCOPIC CHOLECYSTECTOMY WITH INTRAOPERATIVE CHOLANGIOGRAM;  Surgeon: Georganna Skeans, MD;  Location: [place];  Service: General;  Laterality: N/A;  . EYE SURGERY    . INTRAOPERATIVE TRANSESOPHAGEAL ECHOCARDIOGRAM  10/05/2015   Procedure: INTRAOPERATIVE TRANSESOPHAGEAL ECHOCARDIOGRAM;  Surgeon: Rexene Alberts, MD;  Location: [facility] OR;  Service: Open Heart Surgery;;  . JOINT REPLACEMENT     shoulder  . LEFT HEART CATHETERIZATION WITH CORONARY ANGIOGRAM N/A 11/16/2014   Procedure: LEFT HEART CATHETERIZATION WITH CORONARY ANGIOGRAM;  Surgeon: Troy Sine,  MD;  Location: [place] [facility] CATH LAB;  Service: Cardiovascular;  Laterality: N/A;  . MITRAL VALVE REPAIR Right 10/04/2015   Procedure: MINIMALLY INVASIVE MITRAL VALVE REPAIR (MVR);  Surgeon: Rexene Alberts, MD;  Location: [place];  Service: Open Heart Surgery;  Laterality: Right;  . Rt rotator cuff repair    . TEE WITHOUT CARDIOVERSION N/A 06/22/2015   Procedure: TRANSESOPHAGEAL ECHOCARDIOGRAM (TEE);  Surgeon: Skeet Latch, MD;  Location: [place];  Service: Cardiovascular;  Laterality: N/A;  . TEE WITHOUT CARDIOVERSION N/A 10/04/2015   Procedure: TRANSESOPHAGEAL ECHOCARDIOGRAM (TEE);  Surgeon: Rexene Alberts, MD;  Location: [place];  Service: Open Heart Surgery;   Laterality: N/A;  . WOUND EXPLORATION N/A 10/05/2015   Procedure: Sternal Incision;  Surgeon: Rexene Alberts, MD;  Location: [place];  Service: Open Heart Surgery;  Laterality: N/A;   Current Outpatient Prescriptions on File Prior to Visit  Medication Sig Dispense Refill  . allopurinol (ZYLOPRIM) 100 MG tablet TAKE ONE (1) TABLET EACH DAY 90 tablet 3  . colchicine 0.6 MG tablet Take 1 tablet (0.6 mg total) by mouth daily. 30 tablet 5  . finasteride (PROSCAR) 5 MG tablet TAKE ONE (1) TABLET BY MOUTH EVERY DAY 30 tablet 5  . furosemide (LASIX) 40 MG tablet Take 1 tablet (40 mg total) by mouth 2 (two) times daily. (Patient taking differently: Take 120 mg by mouth 2 (two) times daily. ) 60 tablet 5  . magnesium oxide (MAG-OX) 400 MG tablet Take 1 tablet (400 mg total) by mouth daily. 30 tablet 0  . metoprolol tartrate (LOPRESSOR) 25 MG tablet TAKE ONE-HALF TABLET BY MOUTH TWICE DAILY 30 tablet 5  . OVER THE COUNTER MEDICATION Neuroquell: Take 1 capsule by mouth once a day for immune health    . potassium chloride (KLOR-CON) 20 MEQ packet Take 40 mEq by mouth daily. 60 packet 0  . pravastatin (PRAVACHOL) 40 MG tablet Take 1 tablet (40 mg total) by mouth daily at 6 PM. 30 tablet 11  . traMADol (ULTRAM) 50 MG tablet Take 1 tablet (50 mg total) by mouth every 6 (six) hours as needed for moderate pain. 30 tablet 0  . warfarin (COUMADIN) 4 MG tablet Take 1 tablet (4 mg total) by mouth daily. 30 tablet 1  . Wheat Dextrin (BENEFIBER) POWD Take by mouth See admin instructions. Mix one teaspoonful into 6-8 ounces of fluid/milk and drink once daily     No current facility-administered medications on file prior to visit.    No Known Allergies Social History   Social History  . Marital status: Married    Spouse name: N/A  . Number of children: 4  . Years of education: N/A   Occupational History  .  Retired   Social History Main Topics  . Smoking status: Former Smoker    Types: Cigarettes, Pipe, Landscape architect   . Smokeless tobacco: Former Systems developer    Types: Chew     Comment: [place] YEARS AGO"  . Alcohol use No  . Drug use: No  . Sexual activity: Not on file   Other Topics Concern  . Not on file   Social History Narrative   ** Merged History Encounter **       Four adopted children.  Lives alone.       Review of Systems  All other systems reviewed and are negative.      Objective:   Physical Exam  Constitutional: He appears well-developed and well-nourished.  Cardiovascular: Normal rate.  Murmur heard. Pulmonary/Chest: Effort normal. He has rales.  Abdominal: Soft. Bowel sounds are normal. He exhibits no distension. There is no tenderness. There is no rebound.  Musculoskeletal: He exhibits edema.  Vitals reviewed.         Assessment & Plan:  Acute systolic congestive heart failure (HCC) - Plan: COMPLETE METABOLIC PANEL WITH GFR, Brain natriuretic peptide  Patient tends to appear fluid overloaded on exam.  Continue Lasix 120 mg p.o. twice daily in addition to potassium 40 mEq a day.  Check BMP to monitor potassium as well as renal function.  Recheck BNP as it was over 800 at his first visit.  Based on lab work, will determine the length of time to continue aggressive diuresis.  For now I will make no changes in his medication.  If renal function is worsening, we may have to proceed cautiously but for the time being I am planning on continuing the higher dose of Lasix through the weekend into next week.

## 2017-05-30 ENCOUNTER — Other Ambulatory Visit: Payer: Self-pay

## 2017-05-30 LAB — COMPLETE METABOLIC PANEL WITH GFR
AG Ratio: 0.9 (calc) — ABNORMAL LOW (ref 1.0–2.5)
ALKALINE PHOSPHATASE (APISO): 133 U/L — AB (ref 40–115)
ALT: 15 U/L (ref 9–46)
AST: 36 U/L — ABNORMAL HIGH (ref 10–35)
Albumin: 3.3 g/dL — ABNORMAL LOW (ref 3.6–5.1)
BILIRUBIN TOTAL: 0.9 mg/dL (ref 0.2–1.2)
BUN / CREAT RATIO: 17 (calc) (ref 6–22)
BUN: 22 mg/dL (ref 7–25)
CALCIUM: 8.8 mg/dL (ref 8.6–10.3)
CO2: 30 mmol/L (ref 20–32)
CREATININE: 1.32 mg/dL — AB (ref 0.70–1.11)
Chloride: 96 mmol/L — ABNORMAL LOW (ref 98–110)
GFR, EST AFRICAN AMERICAN: 56 mL/min/{1.73_m2} — AB (ref >=60)
GFR, Est Non African American: 49 mL/min/{1.73_m2} — ABNORMAL LOW (ref >=60)
Globulin: 3.6 g/dL (calc) (ref 1.9–3.7)
Glucose, Bld: 111 mg/dL — ABNORMAL HIGH (ref 65–99)
Potassium: 3.6 mmol/L (ref 3.5–5.3)
Sodium: 137 mmol/L (ref 135–146)
Total Protein: 6.9 g/dL (ref 6.1–8.1)

## 2017-05-30 LAB — BRAIN NATRIURETIC PEPTIDE: BRAIN NATRIURETIC PEPTIDE: 960 pg/mL — AB (ref <100)

## 2017-06-02 ENCOUNTER — Ambulatory Visit (INDEPENDENT_AMBULATORY_CARE_PROVIDER_SITE_OTHER): Payer: Medicare Other | Admitting: Family Medicine

## 2017-06-02 ENCOUNTER — Encounter: Payer: Self-pay | Admitting: Family Medicine

## 2017-06-02 VITALS — BP 110/60 | HR 62 | Temp 98.2°F | Resp 18 | Ht 67.0 in | Wt 215.0 lb

## 2017-06-02 DIAGNOSIS — I5021 Acute systolic (congestive) heart failure: Secondary | ICD-10-CM

## 2017-06-02 DIAGNOSIS — E119 Type 2 diabetes mellitus without complications: Secondary | ICD-10-CM

## 2017-06-02 MED ORDER — FUROSEMIDE 40 MG PO TABS: 120.0000 mg | ORAL_TABLET | Freq: Two times a day (BID) | ORAL | 3 refills | Status: DC

## 2017-06-02 NOTE — Progress Notes (Signed)
Subjective:    Patient ID: [name], male    DOB: 1929-06-28, 83 y.o.   MRN: [id number]  HPI  05/29/17 Please see the previous office visit.   patient is currently taking Lasix 120 mg p.o. twice daily in addition to potassium chloride 40 mEq a day.  At his last visit, the patient weighed 224 pounds.  He has diuresed some and his weight is down to 218 pounds today although he still has +1-2 pitting edema in his right leg and +1 pitting edema in his left leg.  He also has faint bibasilar crackles and continues to endorse mild shortness of breath with exertion.  At that time, my plan was: Patient tends to appear fluid overloaded on exam.  Continue Lasix 120 mg p.o. twice daily in addition to potassium 40 mEq a day.  Check BMP to monitor potassium as well as renal function.  Recheck BNP as it was over 800 at his first visit.  Based on lab work, will determine the length of time to continue aggressive diuresis.  For now I will make no changes in his medication.  If renal function is worsening, we may have to proceed cautiously but for the time being I am planning on continuing the higher dose of Lasix through the weekend into next week.  06/02/17 Patient has lost an additional 3 pounds.  He states that his breathing is much better.  His dyspnea on exertion has improved.  The crackles and rales in his lower lungs have subsided.  His shortness of breath has improved.  He continues to have pitting edema in both legs right greater than left however a lot of this appears to be due to chronic venous insufficiency.  He is not wearing compression hose.  The edema in his legs does not bother him.  His renal function was deteriorating when I checked it last week making me hesitant to aggressively diurese the patient lower than today given that he is asymptomatic.   Past Medical History:  Diagnosis Date  . Atelectasis of right lung   . Chronic diastolic (congestive) heart failure ([place])    a. 10/2013 Echo: EF  55-60%, basal-mid anteroseptal and basal-mid inferoseptal HK.  . Chronic fatigue   . Chronic subdural hematoma ([place])    a. 12/2015 Head CT: chronic right holo-hemispheric SDH w/o midline shift.  . Coronary artery disease    a. 11/2014 NSTEMI: DESx 2 placed to LAD and RCA; b. 08/2015 Cath: LAD patent stent, LCX 37m, RCA patent stent, 20m.  . Diabetes mellitus, type II ([place])    a. HgA1c 6.8 in 03/2015  . ED (erectile dysfunction)   . Gout   . Hemangioma of liver 08/02/2015   12 mm enhancing lesion seen on CT - suspicious for benign hemangioma but not diagnostic - MRI recommended  . HLD (hyperlipidemia)   . Hypertensive heart disease   . Kidney stones   . Lower extremity edema    a. chronic  . OA (osteoarthritis)   . Orthostatic hypotension   . PAF (paroxysmal atrial fibrillation) ([place])    a. 10/2015 s/p DCCV;  b. CHA2DS2VASc = 6--> on coumadin; c. 10/2015 Echo: sev dil LA.  Marland Kitchen Physical deconditioning   . Prostate cancer ([place])   . Pulmonary HTN ([place])   . Pulmonary hypertension ([place])    a. 10/2015 Echo: PASP 73mmHg.  Marland Kitchen Severe mitral regurgitation    a.  10/2015 s/p minimally invasive MV repair; b. 10/2015 Echo: mod MS, mean  grad 44mmHg.  Marland Kitchen Spinal stenosis   . SVT (supraventricular tachycardia) (HCC)    a. recurrent, usually responsive to vagal manuevers  . Tricuspid regurgitation    a. 10/2015 Echo: mild to mod TR.   Past Surgical History:  Procedure Laterality Date  . BACK SURGERY    . Back surgery x 2    . CARDIAC CATHETERIZATION N/A 08/08/2015   Procedure: Right/Left Heart Cath and Coronary Angiography;  Surgeon: Sherren Mocha, MD;  Location: [facility];  Service: Cardiovascular;  Laterality: N/A;  . CARDIAC SURGERY     2 cardiac stents  . CARDIOVERSION N/A 10/20/2015   Procedure: CARDIOVERSION;  Surgeon: Pixie Casino, MD;  Location: [facility];  Service: Cardiovascular;  Laterality: N/A;  . CERVICAL SPINE SURGERY    . CHOLECYSTECTOMY N/A 07/08/2016   Procedure:  LAPAROSCOPIC CHOLECYSTECTOMY WITH INTRAOPERATIVE CHOLANGIOGRAM;  Surgeon: Georganna Skeans, MD;  Location: [facility];  Service: General;  Laterality: N/A;  . EYE SURGERY    . INTRAOPERATIVE TRANSESOPHAGEAL ECHOCARDIOGRAM  10/05/2015   Procedure: INTRAOPERATIVE TRANSESOPHAGEAL ECHOCARDIOGRAM;  Surgeon: Rexene Alberts, MD;  Location: [facility] OR;  Service: Open Heart Surgery;;  . JOINT REPLACEMENT     shoulder  . LEFT HEART CATHETERIZATION WITH CORONARY ANGIOGRAM N/A 11/16/2014   Procedure: LEFT HEART CATHETERIZATION WITH CORONARY ANGIOGRAM;  Surgeon: Troy Sine, MD;  Location: [facility] CATH LAB;  Service: Cardiovascular;  Laterality: N/A;  . MITRAL VALVE REPAIR Right 10/04/2015   Procedure: MINIMALLY INVASIVE MITRAL VALVE REPAIR (MVR);  Surgeon: Rexene Alberts, MD;  Location: [facility];  Service: Open Heart Surgery;  Laterality: Right;  . Rt rotator cuff repair    . TEE WITHOUT CARDIOVERSION N/A 06/22/2015   Procedure: TRANSESOPHAGEAL ECHOCARDIOGRAM (TEE);  Surgeon: Skeet Latch, MD;  Location: [place];  Service: Cardiovascular;  Laterality: N/A;  . TEE WITHOUT CARDIOVERSION N/A 10/04/2015   Procedure: TRANSESOPHAGEAL ECHOCARDIOGRAM (TEE);  Surgeon: Rexene Alberts, MD;  Location: [facility];  Service: Open Heart Surgery;  Laterality: N/A;  . WOUND EXPLORATION N/A 10/05/2015   Procedure: Sternal Incision;  Surgeon: Rexene Alberts, MD;  Location: [place];  Service: Open Heart Surgery;  Laterality: N/A;   Current Outpatient Prescriptions on File Prior to Visit  Medication Sig Dispense Refill  . allopurinol (ZYLOPRIM) 100 MG tablet TAKE ONE (1) TABLET EACH DAY 90 tablet 3  . colchicine 0.6 MG tablet Take 1 tablet (0.6 mg total) by mouth daily. 30 tablet 5  . finasteride (PROSCAR) 5 MG tablet TAKE ONE (1) TABLET BY MOUTH EVERY DAY 30 tablet 5  . magnesium oxide (MAG-OX) 400 MG tablet Take 1 tablet (400 mg total) by mouth daily. 30 tablet 0  . metoprolol tartrate (LOPRESSOR) 25 MG tablet TAKE ONE-HALF TABLET BY MOUTH  TWICE DAILY 30 tablet 5  . OVER THE COUNTER MEDICATION Neuroquell: Take 1 capsule by mouth once a day for immune health    . potassium chloride (KLOR-CON) 20 MEQ packet Take 40 mEq by mouth daily. 60 packet 0  . pravastatin (PRAVACHOL) 40 MG tablet Take 1 tablet (40 mg total) by mouth daily at 6 PM. 30 tablet 11  . traMADol (ULTRAM) 50 MG tablet Take 1 tablet (50 mg total) by mouth every 6 (six) hours as needed for moderate pain. 30 tablet 0  . warfarin (COUMADIN) 4 MG tablet Take 1 tablet (4 mg total) by mouth daily. 30 tablet 1  . Wheat Dextrin (BENEFIBER) POWD Take by mouth See admin instructions. Mix one teaspoonful into  6-8 ounces of fluid/milk and drink once daily     No current facility-administered medications on file prior to visit.    No Known Allergies Social History   Social History  . Marital status: Married    Spouse name: N/A  . Number of children: 4  . Years of education: N/A   Occupational History  .  Retired   Social History Main Topics  . Smoking status: Former Smoker    Types: Cigarettes, Pipe, Landscape architect  . Smokeless tobacco: Former Systems developer    Types: Chew     Comment: [place] YEARS AGO"  . Alcohol use No  . Drug use: No  . Sexual activity: Not on file   Other Topics Concern  . Not on file   Social History Narrative   ** Merged History Encounter **       Four adopted children.  Lives alone.       Review of Systems  All other systems reviewed and are negative.      Objective:   Physical Exam  Constitutional: He appears well-developed and well-nourished.  Cardiovascular: Normal rate.   Murmur heard. Pulmonary/Chest: Effort normal. He has no wheezes. He has no rales.  Abdominal: Soft. Bowel sounds are normal. He exhibits no distension. There is no tenderness. There is no rebound.  Musculoskeletal: He exhibits edema.  Vitals reviewed.         Assessment & Plan:  Acute systolic congestive heart failure (HCC)  Acute systolic heart  failure (HCC)  Controlled type 2 diabetes mellitus without complication, without long-term current use of insulin (HCC) - Plan: Hemoglobin A1c, Microalbumin, urine, COMPLETE METABOLIC PANEL WITH GFR  Patient appears euvolemic although he does have pitting edema in both legs from the mid shin to his ankle which appears to be edema secondary to chronic venous insufficiency rather than due to congestive heart failure at this point.  I believe we need to maintain his dry weight rather than to pursue further aggressive diuresis.  Decrease Lasix to 80 mg twice daily.  Continue potassium at 40 mEq p.o. daily and recheck a CMP.  Patient has a history of diabetes.  He is overdue for microalbumin as well as a hemoglobin A1c which I will check while he is here today

## 2017-06-03 LAB — COMPLETE METABOLIC PANEL WITH GFR
AG Ratio: 1 (calc) (ref 1.0–2.5)
ALKALINE PHOSPHATASE (APISO): 127 U/L — AB (ref 40–115)
ALT: 16 U/L (ref 9–46)
AST: 34 U/L (ref 10–35)
Albumin: 3.2 g/dL — ABNORMAL LOW (ref 3.6–5.1)
BILIRUBIN TOTAL: 0.8 mg/dL (ref 0.2–1.2)
BUN/Creatinine Ratio: 17 (calc) (ref 6–22)
BUN: 23 mg/dL (ref 7–25)
CHLORIDE: 101 mmol/L (ref 98–110)
CO2: 31 mmol/L (ref 20–32)
CREATININE: 1.35 mg/dL — AB (ref 0.70–1.11)
Calcium: 8.9 mg/dL (ref 8.6–10.3)
GFR, Est African American: 55 mL/min/{1.73_m2} — ABNORMAL LOW (ref >=60)
GFR, Est Non African American: 47 mL/min/{1.73_m2} — ABNORMAL LOW (ref >=60)
GLUCOSE: 141 mg/dL — AB (ref 65–99)
Globulin: 3.3 g/dL (calc) (ref 1.9–3.7)
Potassium: 3.6 mmol/L (ref 3.5–5.3)
Sodium: 141 mmol/L (ref 135–146)
Total Protein: 6.5 g/dL (ref 6.1–8.1)

## 2017-06-03 LAB — HEMOGLOBIN A1C
EAG (MMOL/L): 7 (calc)
Hgb A1c MFr Bld: 6 % of total Hgb — ABNORMAL HIGH (ref <5.7)
MEAN PLASMA GLUCOSE: 126 (calc)

## 2017-06-04 ENCOUNTER — Other Ambulatory Visit: Payer: Self-pay | Admitting: Family Medicine

## 2017-06-05 ENCOUNTER — Other Ambulatory Visit: Payer: Self-pay | Admitting: *Deleted

## 2017-06-05 NOTE — Patient Outreach (Signed)
[facility]) Care Management  06/05/2017  [name] April 13, 1929 [id number]  Referral via EMMI-Patient Engagement Tool:  Telephone call to patient who was advised of reason for call. Patient states he was awakened from nap & better time to talk would be next week   Plan: Will follow up next week. Patient agreed with set appointment.   Sherrin Daisy, RN BSN [place] Management Coordinator [facility] Care Management  [phone]

## 2017-06-06 ENCOUNTER — Other Ambulatory Visit: Payer: Self-pay | Admitting: Family Medicine

## 2017-06-06 MED ORDER — WARFARIN SODIUM 4 MG PO TABS: ORAL_TABLET | ORAL | 3 refills | Status: DC

## 2017-06-10 ENCOUNTER — Ambulatory Visit: Payer: Self-pay | Admitting: *Deleted

## 2017-06-12 ENCOUNTER — Other Ambulatory Visit: Payer: Self-pay | Admitting: *Deleted

## 2017-06-12 NOTE — Patient Outreach (Signed)
[facility]) Care Management  06/12/2017  [name] 11-Mar-1929 [id number]  Telephone call to patient; patient unavailable to talk now; advised to call at another time.   Plan: Appointment set with patent agreement.  Sherrin Daisy, RN BSN [place] Management Coordinator [facility] Care Management  [phone]

## 2017-06-13 ENCOUNTER — Other Ambulatory Visit: Payer: Self-pay | Admitting: *Deleted

## 2017-06-13 NOTE — Patient Outreach (Signed)
[facility]) Care Management  06/13/2017  [name] 09-11-1928 [id number]  Telephone call to patient who was advised on [facility] care management services. HIPPA verification received from patient.  Patient agreed to screening assessment which was completed.   Patient voices medical history includes heart bypass surgery 2 years ago, chronic back pain & heart failure. States he weighs daily & records.  Voices he is aware of when to call MD regarding weight gains. States he has swelling in legs everyday if he is not elevating them. States his MD is aware of his swelling. Voices that he is taking medications as prescribed including fluid pill . States spouse fixes his medications for him.  Voices that he is interested in [facility] services for Heart failure.   Plan: Will refer to [place] for disease management for heart failure.  Sherrin Daisy, RN BSN [place] Management Coordinator [facility] Care Management  [phone]

## 2017-06-15 IMAGING — CR DG CHEST 1V PORT
1 series · 1 of 1 positions shown · non-contrast
Comparison: 10/02/2015

CLINICAL DATA: Minimally invasive mitral valve repair a

EXAM:
PORTABLE CHEST 1 VIEW

[AP]
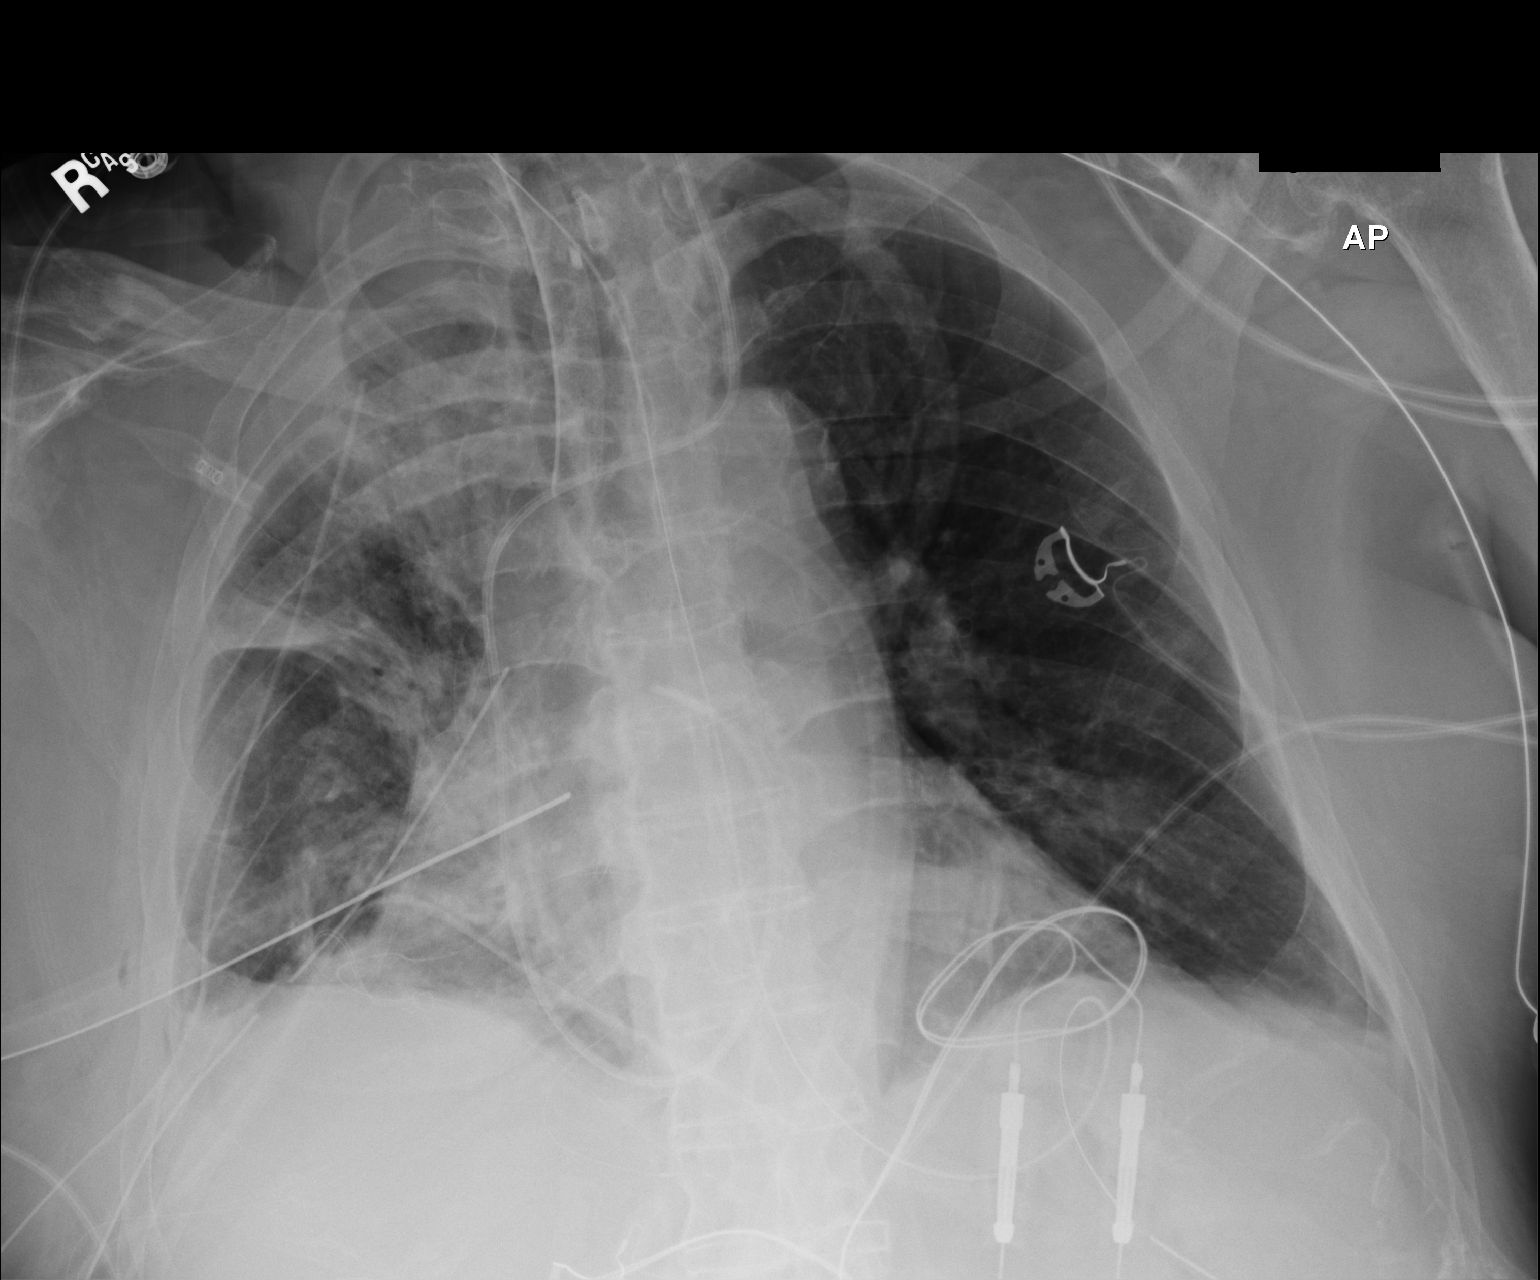

[1 of 1 positions shown; findings below may reference images not displayed]

FINDINGS: Endotracheal tube 4 cm from carina. NG tube in stomach. Swan-Ganz
catheter with tip in the main pulmonary artery. Two RIGHT chest
tubes in place. Volume loss in the RIGHT hemi thorax. This
atelectasis the RIGHT upper lobe RIGHT lower lobe with small
effusion. No pneumothorax. LEFT lung clear.
IMPRESSION: 1. Support apparatus appears in good position.
2. RIGHT lung atelectasis, effusion and 2 chest tubes in RIGHT hemi
thorax.

## 2017-06-16 ENCOUNTER — Encounter: Payer: Self-pay | Admitting: *Deleted

## 2017-06-16 IMAGING — CR DG CHEST 1V PORT
1 series · 1 of 1 positions shown · non-contrast
Comparison: 10/05/2015 and prior exam

CLINICAL DATA: Status post mitral valve repair postop 1 and sternal
re- incision.

EXAM:
PORTABLE CHEST 1 VIEW

[AP]
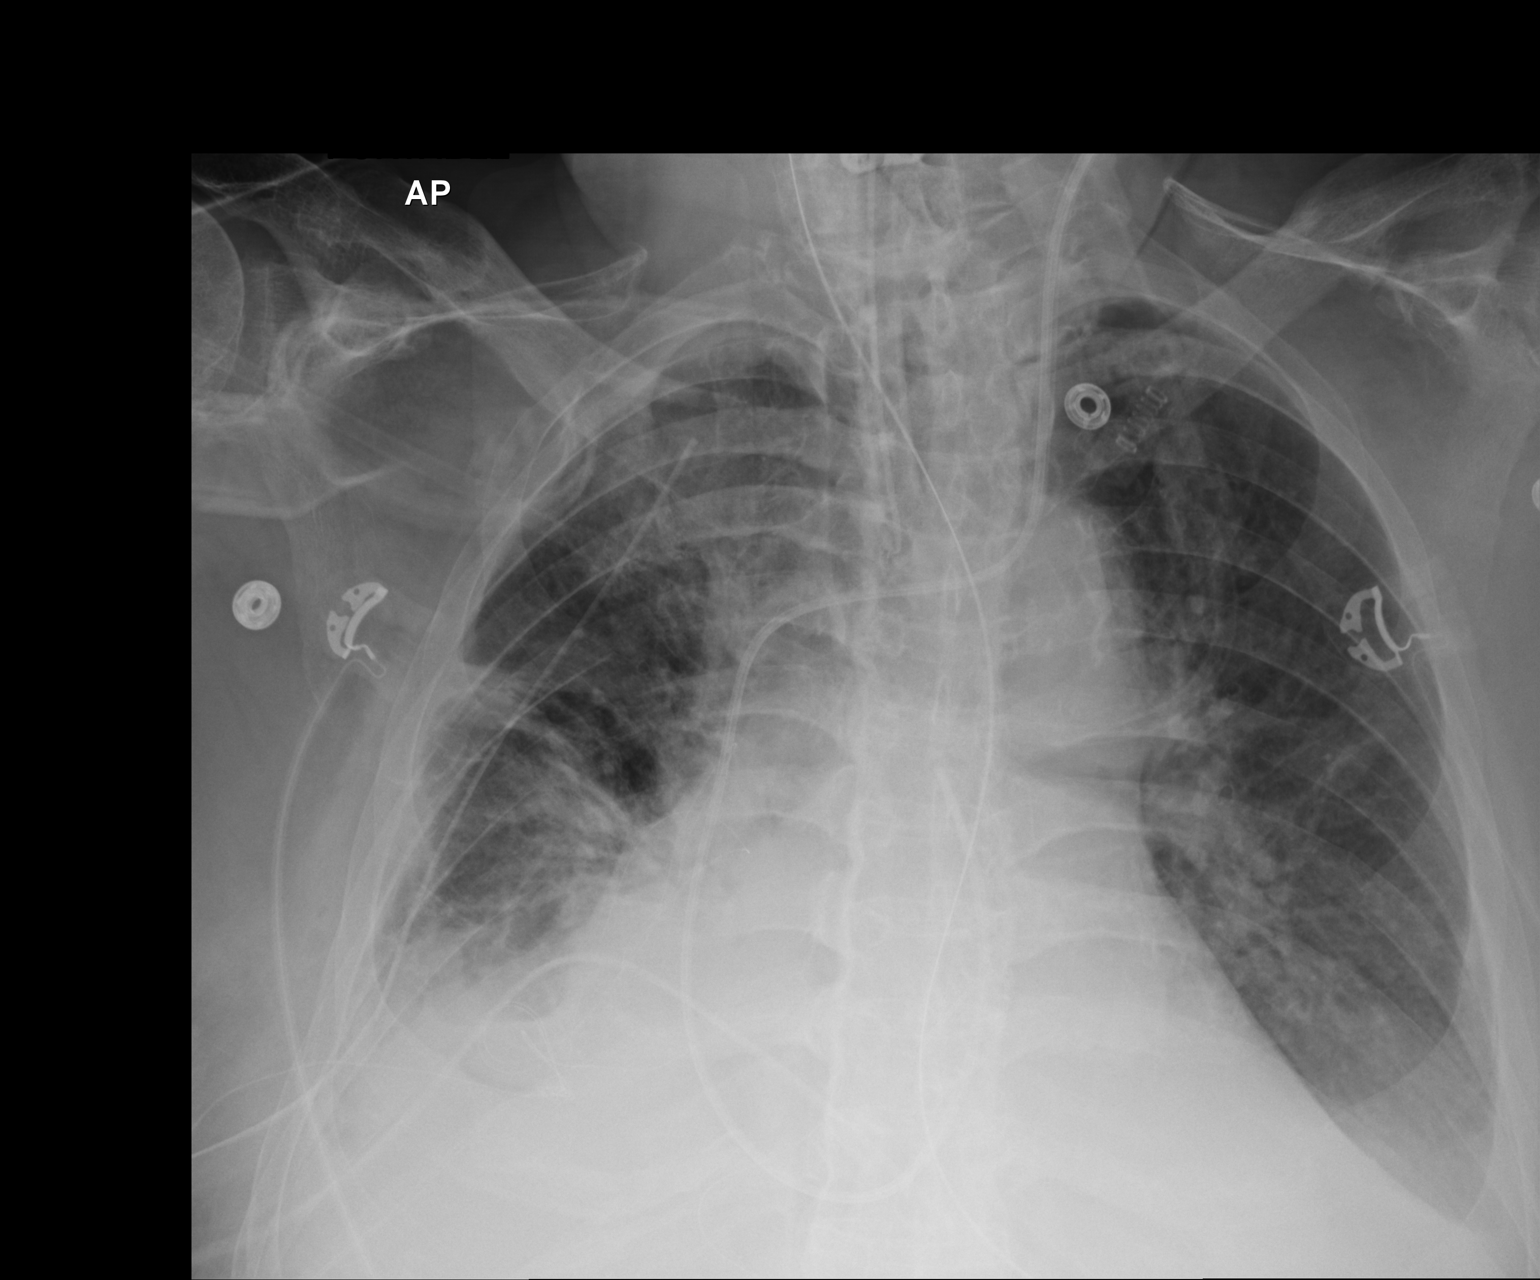

[1 of 1 positions shown; findings below may reference images not displayed]

FINDINGS: An endotracheal tube with tip 4.2 cm above the carina, left IJ
Swan-Ganz catheter with tip overlying the main pulmonary artery, and
mediastinal/right thoracostomy tubes again noted.

Improved right lung aeration noted with decreased edema/ airspace
opacity.

Bilateral lower lung opacities/ atelectasis/consolidation again
noted.

There is no evidence of pneumothorax.

No other significant changes identified.
IMPRESSION: Improved aeration bilaterally with decreased right lung
edema/airspace opacity.

Support apparatus as described.  No evidence of pneumothorax.

Continued bilateral lower lung opacities.

## 2017-06-16 NOTE — Telephone Encounter (Signed)
This encounter was created in error - please disregard.

## 2017-06-18 IMAGING — CR DG CHEST 1V PORT
1 series · 1 of 1 positions shown · non-contrast
Comparison: October 06, 2015

CLINICAL DATA: Congestive heart failure.

EXAM:
PORTABLE CHEST 1 VIEW

[AP]
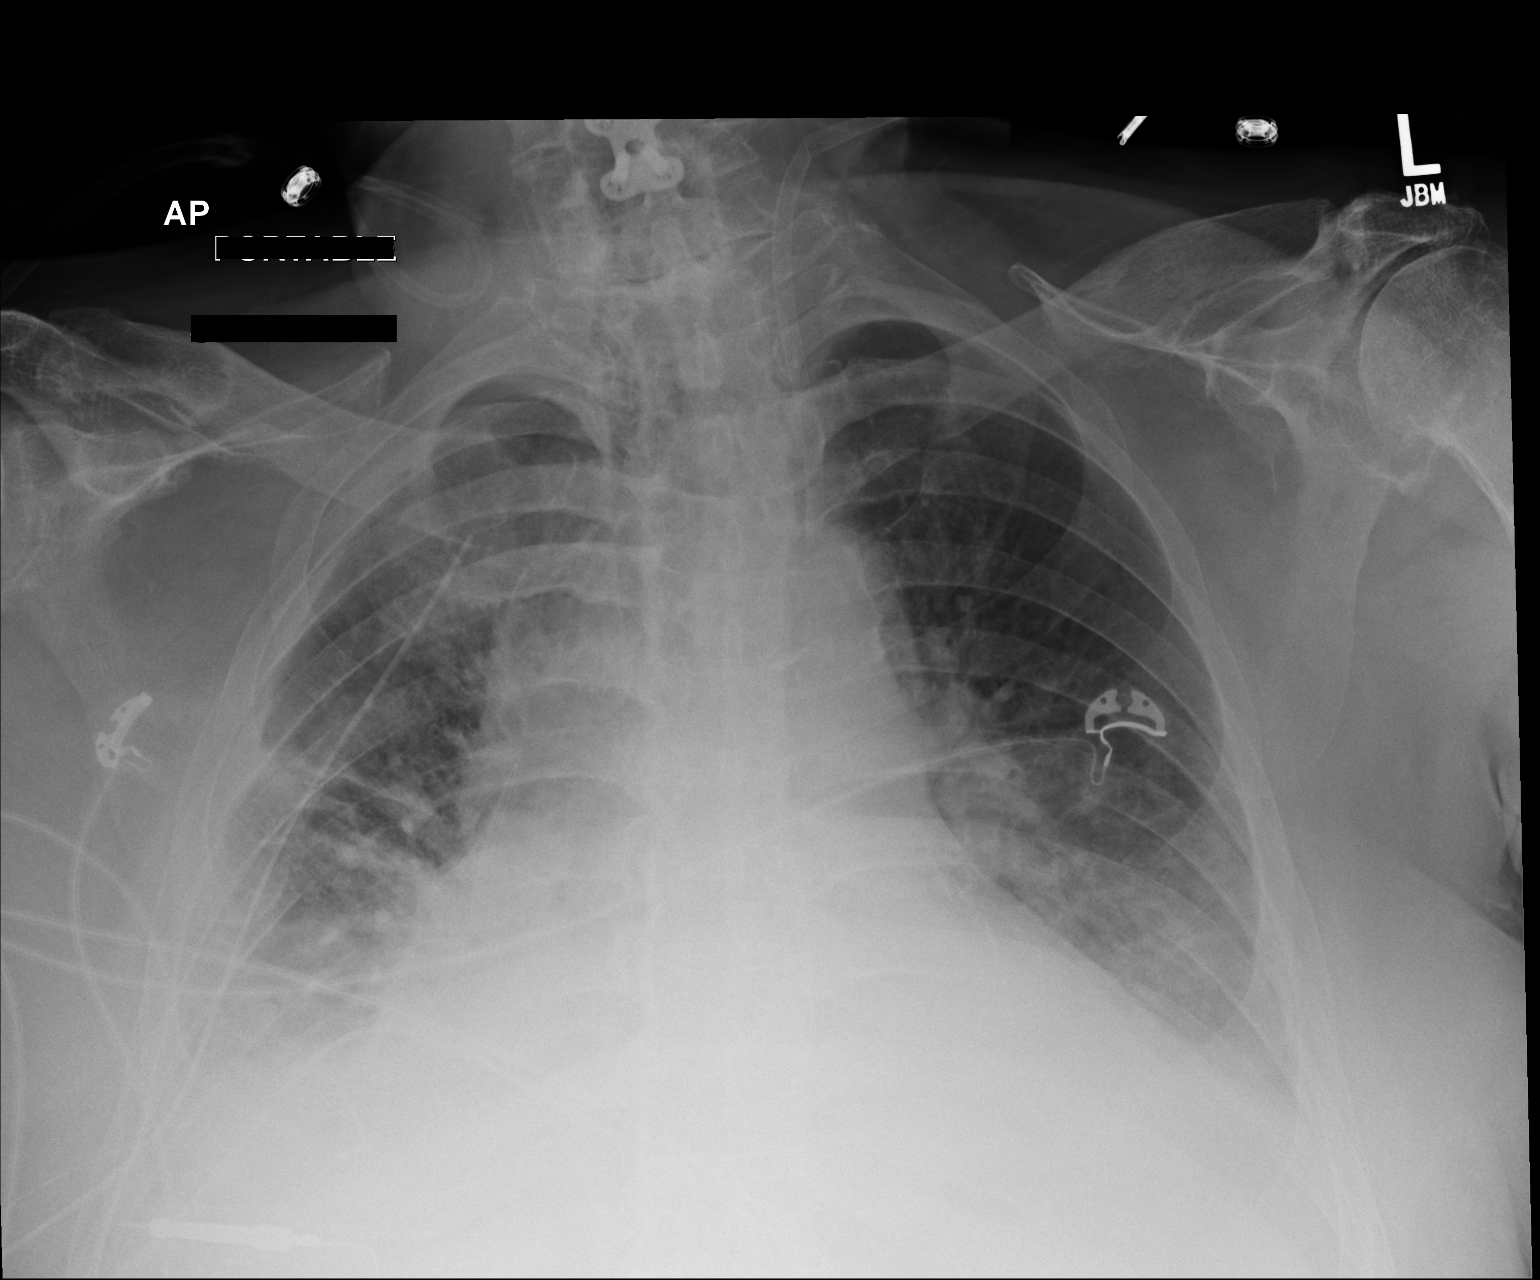

[1 of 1 positions shown; findings below may reference images not displayed]

FINDINGS: The PA catheter has been removed with a left IJ sheath remaining.
Other support apparatus is stable. No pneumothorax identified.
Bilateral layering effusions with underlying atelectasis are stable.
More diffuse opacity remains in the right mid and lower lung. Stable
cardiomegaly. No other interval changes.
IMPRESSION: Interval removal of the PA catheter. Small bilateral layering
effusions with underlying atelectasis. Persistent opacity in the
right mid and lower lung.

## 2017-06-20 IMAGING — CR DG CHEST 1V PORT
1 series · 1 of 1 positions shown · non-contrast
Comparison: 10/08/2015

CLINICAL DATA: Status post mitral valve repair

EXAM:
PORTABLE CHEST 1 VIEW

[AP]
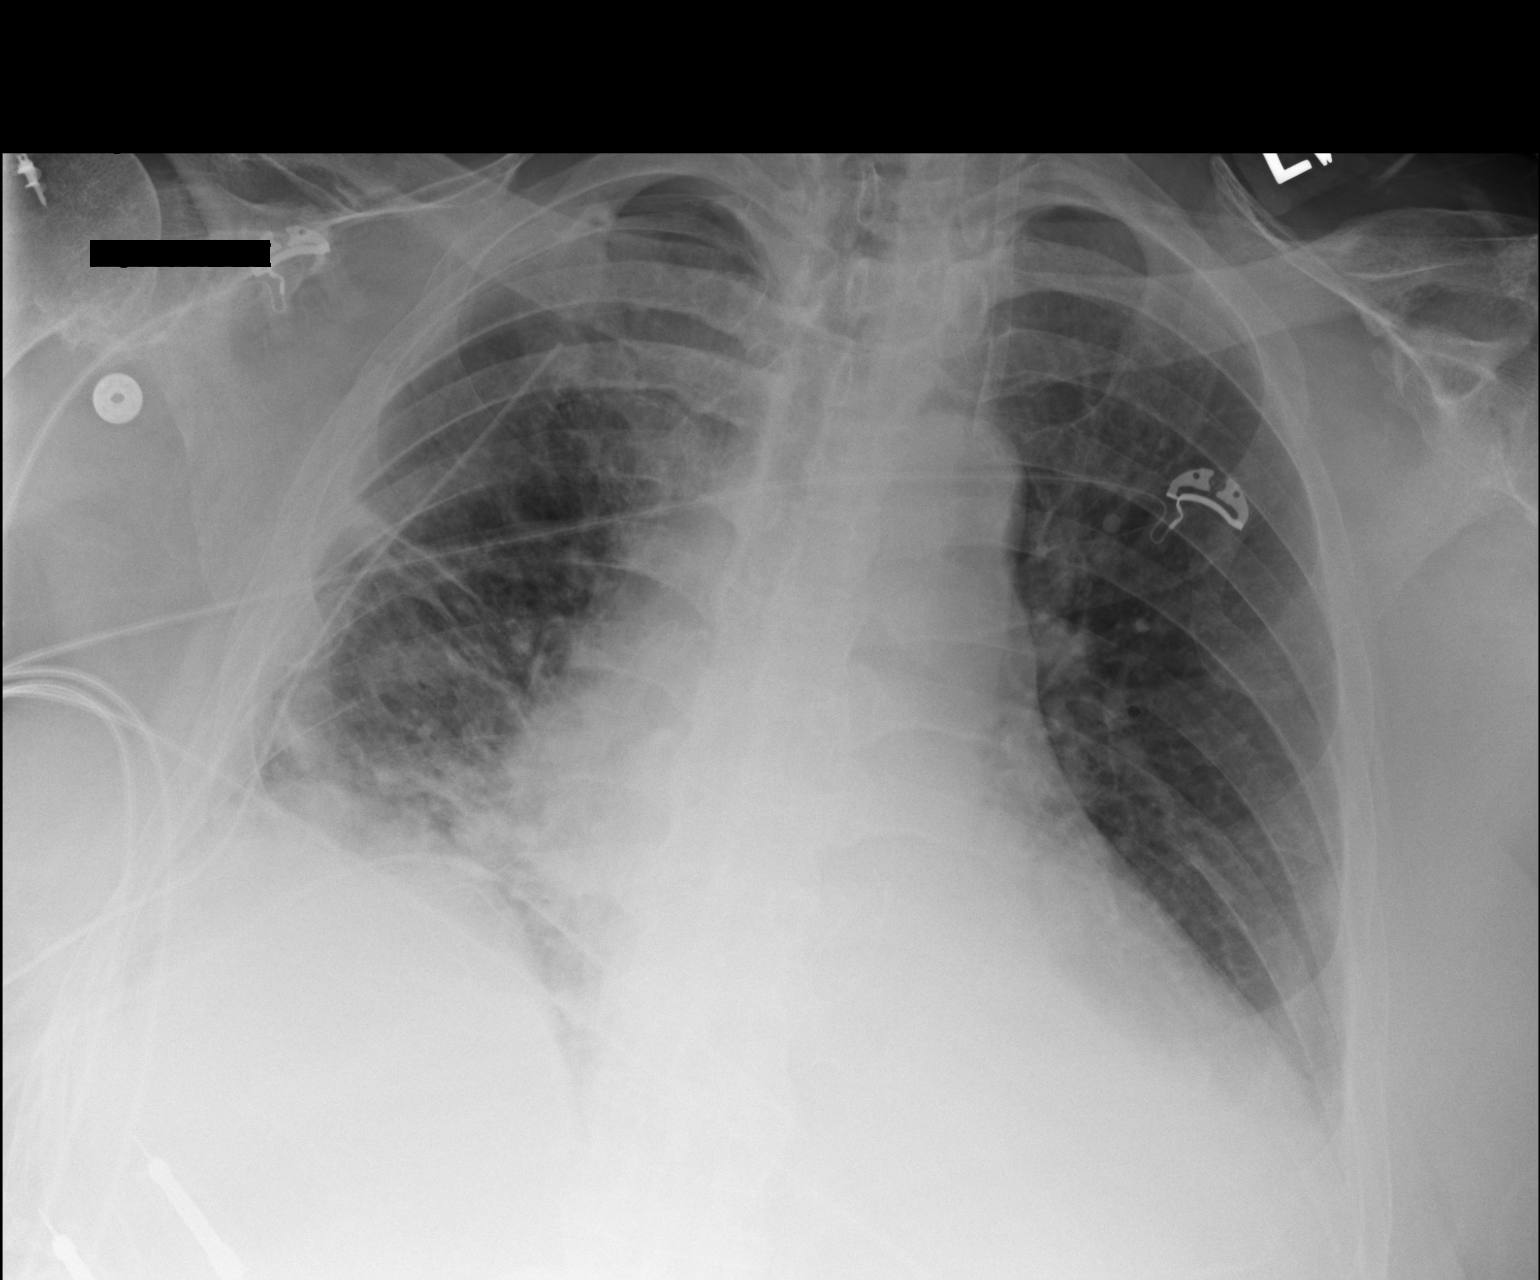

[1 of 1 positions shown; findings below may reference images not displayed]

FINDINGS: The cardiac shadow is enlarged but stable. Two right-sided chest
tubes are again seen and stable no pneumothorax is noted. Right
basilar atelectasis is noted slightly progressed when compare with
the prior exam. No bony abnormality is noted.
IMPRESSION: Increased right basilar atelectatic changes.

Thoracostomy catheters without pneumothorax.

Although not mentioned in the body of the report, a left jugular
sheath remains.

## 2017-07-07 ENCOUNTER — Other Ambulatory Visit: Payer: Self-pay | Admitting: Family Medicine

## 2017-07-18 ENCOUNTER — Encounter: Payer: Self-pay | Admitting: Family Medicine

## 2017-07-18 ENCOUNTER — Ambulatory Visit (INDEPENDENT_AMBULATORY_CARE_PROVIDER_SITE_OTHER): Payer: Medicare Other | Admitting: Family Medicine

## 2017-07-18 VITALS — BP 118/60 | HR 68 | Temp 98.2°F | Resp 18 | Ht 67.0 in | Wt 222.0 lb

## 2017-07-18 DIAGNOSIS — I480 Paroxysmal atrial fibrillation: Secondary | ICD-10-CM

## 2017-07-18 DIAGNOSIS — I5022 Chronic systolic (congestive) heart failure: Secondary | ICD-10-CM

## 2017-07-18 LAB — PT WITH INR/FINGERSTICK
INR FINGERSTICK: 2.5 ratio — AB
PT, fingerstick: 30.2 s — ABNORMAL HIGH (ref 10.5–13.1)

## 2017-07-18 LAB — BASIC METABOLIC PANEL WITH GFR
BUN / CREAT RATIO: 20 (calc) (ref 6–22)
BUN: 27 mg/dL — ABNORMAL HIGH (ref 7–25)
CALCIUM: 8.7 mg/dL (ref 8.6–10.3)
CHLORIDE: 101 mmol/L (ref 98–110)
CO2: 28 mmol/L (ref 20–32)
Creat: 1.32 mg/dL — ABNORMAL HIGH (ref 0.70–1.11)
GFR, EST AFRICAN AMERICAN: 56 mL/min/{1.73_m2} — AB (ref >=60)
GFR, Est Non African American: 49 mL/min/{1.73_m2} — ABNORMAL LOW (ref >=60)
GLUCOSE: 145 mg/dL — AB (ref 65–99)
POTASSIUM: 3.9 mmol/L (ref 3.5–5.3)
Sodium: 138 mmol/L (ref 135–146)

## 2017-07-18 MED ORDER — FUROSEMIDE 80 MG PO TABS: 80.0000 mg | ORAL_TABLET | Freq: Two times a day (BID) | ORAL | 3 refills | Status: DC

## 2017-07-18 NOTE — Progress Notes (Signed)
Subjective:    Patient ID: [name], male    DOB: 1929-01-21, 83 y.o.   MRN: [id number]  HPI  05/29/17 Please see the previous office visit.   patient is currently taking Lasix 120 mg p.o. twice daily in addition to potassium chloride 40 mEq a day.  At his last visit, the patient weighed 224 pounds.  He has diuresed some and his weight is down to 218 pounds today although he still has +1-2 pitting edema in his right leg and +1 pitting edema in his left leg.  He also has faint bibasilar crackles and continues to endorse mild shortness of breath with exertion.  At that time, my plan was: Patient tends to appear fluid overloaded on exam.  Continue Lasix 120 mg p.o. twice daily in addition to potassium 40 mEq a day.  Check BMP to monitor potassium as well as renal function.  Recheck BNP as it was over 800 at his first visit.  Based on lab work, will determine the length of time to continue aggressive diuresis.  For now I will make no changes in his medication.  If renal function is worsening, we may have to proceed cautiously but for the time being I am planning on continuing the higher dose of Lasix through the weekend into next week.  06/02/17 Patient has lost an additional 3 pounds.  He states that his breathing is much better.  His dyspnea on exertion has improved.  The crackles and rales in his lower lungs have subsided.  His shortness of breath has improved.  He continues to have pitting edema in both legs right greater than left however a lot of this appears to be due to chronic venous insufficiency.  He is not wearing compression hose.  The edema in his legs does not bother him.  His renal function was deteriorating when I checked it last week making me hesitant to aggressively diurese the patient lower than today given that he is asymptomatic.   At that time, my plan was: Patient appears euvolemic although he does have pitting edema in both legs from the mid shin to his ankle which appears  to be edema secondary to chronic venous insufficiency rather than due to congestive heart failure at this point.  I believe we need to maintain his dry weight rather than to pursue further aggressive diuresis.  Decrease Lasix to 80 mg twice daily.  Continue potassium at 40 mEq p.o. daily and recheck a CMP.  Patient has a history of diabetes.  He is overdue for microalbumin as well as a hemoglobin A1c which I will check while he is here today  07/18/17 Patient is here today to recheck his INR.  His last INR was 2.1 at the end of October.  He is checking it every 6 weeks.  Currently on 4 mg of Coumadin every day.  He takes this for atrial fibrillation given his elevated chads score and his recent history of arterial embolization to his fingertips given his previous history of mitral valve repair.  He is also taking Lasix 80 mg twice daily.  Medicine list says 120 mg twice daily but the patient is actually taking 80 mg twice daily.  His weight is slightly higher than his last visit but he is also wearing a coat and have a close due to the recent snowstorm.  Patient states his weight is relatively unchanged since his last visit in October.  He has +2 pitting edema in his right leg  and only trace pitting edema in his left leg.  This is a chronic problem for the patient.  He has some mild basilar crackles in his lung but actually appears relatively euvolemic given his previous history.  He denies any shortness of breath or dyspnea on exertion. Past Medical History:  Diagnosis Date  . Atelectasis of right lung   . Chronic diastolic (congestive) heart failure ([place])    a. 10/2013 Echo: EF 55-60%, basal-mid anteroseptal and basal-mid inferoseptal HK.  . Chronic fatigue   . Chronic subdural hematoma ([place])    a. 12/2015 Head CT: chronic right holo-hemispheric SDH w/o midline shift.  . Coronary artery disease    a. 11/2014 NSTEMI: DESx 2 placed to LAD and RCA; b. 08/2015 Cath: LAD patent stent, LCX 28m, RCA patent  stent, 55m.  . Diabetes mellitus, type II ([place])    a. HgA1c 6.8 in 03/2015  . ED (erectile dysfunction)   . Gout   . Hemangioma of liver 08/02/2015   12 mm enhancing lesion seen on CT - suspicious for benign hemangioma but not diagnostic - MRI recommended  . HLD (hyperlipidemia)   . Hypertensive heart disease   . Kidney stones   . Lower extremity edema    a. chronic  . OA (osteoarthritis)   . Orthostatic hypotension   . PAF (paroxysmal atrial fibrillation) ([place])    a. 10/2015 s/p DCCV;  b. CHA2DS2VASc = 6--> on coumadin; c. 10/2015 Echo: sev dil LA.  Marland Kitchen Physical deconditioning   . Prostate cancer ([place])   . Pulmonary HTN ([place])   . Pulmonary hypertension ([place])    a. 10/2015 Echo: PASP 70mmHg.  Marland Kitchen Severe mitral regurgitation    a.  10/2015 s/p minimally invasive MV repair; b. 10/2015 Echo: mod MS, mean grad 50mmHg.  Marland Kitchen Spinal stenosis   . SVT (supraventricular tachycardia) ([facility])    a. recurrent, usually responsive to vagal manuevers  . Tricuspid regurgitation    a. 10/2015 Echo: mild to mod TR.   Past Surgical History:  Procedure Laterality Date  . BACK SURGERY    . Back surgery x 2    . CARDIAC CATHETERIZATION N/A 08/08/2015   Procedure: Right/Left Heart Cath and Coronary Angiography;  Surgeon: Sherren Mocha, MD;  Location: [facility];  Service: Cardiovascular;  Laterality: N/A;  . CARDIAC SURGERY     2 cardiac stents  . CARDIOVERSION N/A 10/20/2015   Procedure: CARDIOVERSION;  Surgeon: Pixie Casino, MD;  Location: [place];  Service: Cardiovascular;  Laterality: N/A;  . CERVICAL SPINE SURGERY    . CHOLECYSTECTOMY N/A 07/08/2016   Procedure: LAPAROSCOPIC CHOLECYSTECTOMY WITH INTRAOPERATIVE CHOLANGIOGRAM;  Surgeon: Georganna Skeans, MD;  Location: [place];  Service: General;  Laterality: N/A;  . EYE SURGERY    . INTRAOPERATIVE TRANSESOPHAGEAL ECHOCARDIOGRAM  10/05/2015   Procedure: INTRAOPERATIVE TRANSESOPHAGEAL ECHOCARDIOGRAM;  Surgeon: Rexene Alberts, MD;  Location[facility] OR;   Service: Open Heart Surgery;;  . JOINT REPLACEMENT     shoulder  . LEFT HEART CATHETERIZATION WITH CORONARY ANGIOGRAM N/A 11/16/2014   Procedure: LEFT HEART CATHETERIZATION WITH CORONARY ANGIOGRAM;  Surgeon: Troy Sine, MD;  Location: [facility] CATH LAB;  Service: Cardiovascular;  Laterality: N/A;  . MITRAL VALVE REPAIR Right 10/04/2015   Procedure: MINIMALLY INVASIVE MITRAL VALVE REPAIR (MVR);  Surgeon: Rexene Alberts, MD;  Location: [place];  Service: Open Heart Surgery;  Laterality: Right;  . Rt rotator cuff repair    . TEE WITHOUT CARDIOVERSION N/A 06/22/2015   Procedure:  TRANSESOPHAGEAL ECHOCARDIOGRAM (TEE);  Surgeon: Skeet Latch, MD;  Location: [place];  Service: Cardiovascular;  Laterality: N/A;  . TEE WITHOUT CARDIOVERSION N/A 10/04/2015   Procedure: TRANSESOPHAGEAL ECHOCARDIOGRAM (TEE);  Surgeon: Rexene Alberts, MD;  Location: [place];  Service: Open Heart Surgery;  Laterality: N/A;  . WOUND EXPLORATION N/A 10/05/2015   Procedure: Sternal Incision;  Surgeon: Rexene Alberts, MD;  Location: [place];  Service: Open Heart Surgery;  Laterality: N/A;   Current Outpatient Medications on File Prior to Visit  Medication Sig Dispense Refill  . allopurinol (ZYLOPRIM) 100 MG tablet TAKE ONE (1) TABLET EACH DAY 90 tablet 3  . colchicine 0.6 MG tablet Take 1 tablet (0.6 mg total) by mouth daily. 30 tablet 5  . finasteride (PROSCAR) 5 MG tablet TAKE ONE (1) TABLET BY MOUTH EVERY DAY 30 tablet 5  . furosemide (LASIX) 40 MG tablet Take 3 tablets (120 mg total) by mouth 2 (two) times daily. (Patient taking differently: Take 80 mg by mouth 2 (two) times daily. ) 180 tablet 3  . magnesium oxide (MAG-OX) 400 MG tablet Take 1 tablet (400 mg total) by mouth daily. 30 tablet 0  . metoprolol tartrate (LOPRESSOR) 25 MG tablet TAKE ONE-HALF TABLET BY MOUTH TWICE DAILY 30 tablet 5  . OVER THE COUNTER MEDICATION [place]: Take 1 capsule by mouth once a day for immune health    . potassium chloride (KLOR-CON) 20  MEQ packet Take 40 mEq by mouth daily. 60 packet 0  . pravastatin (PRAVACHOL) 40 MG tablet TAKE ONE (1) TABLET EACH DAY AT 6 P.M. 30 tablet 10  . traMADol (ULTRAM) 50 MG tablet Take 1 tablet (50 mg total) by mouth every 6 (six) hours as needed for moderate pain. 30 tablet 0  . warfarin (COUMADIN) 4 MG tablet Take 1 tablet (4 mg total) by mouth daily. Do not take with any other warfarin unless Dr. Dennard Schaumann instructs. 30 tablet 3  . Wheat Dextrin (BENEFIBER) POWD Take by mouth See admin instructions. Mix one teaspoonful into 6-8 ounces of fluid/milk and drink once daily     No current facility-administered medications on file prior to visit.    No Known Allergies Social History   Socioeconomic History  . Marital status: Married    Spouse name: Not on file  . Number of children: 4  . Years of education: Not on file  . Highest education level: Not on file  Social Needs  . Financial resource strain: Not on file  . Food insecurity - worry: Not on file  . Food insecurity - inability: Not on file  . Transportation needs - medical: Not on file  . Transportation needs - non-medical: Not on file  Occupational History    Employer: RETIRED  Tobacco Use  . Smoking status: Former Smoker    Types: Cigarettes, Pipe, Landscape architect  . Smokeless tobacco: Former Systems developer    Types: Chew  . Tobacco comment: QUIT SMOKING  MANY YEARS AGO"  Substance and Sexual Activity  . Alcohol use: No  . Drug use: No  . Sexual activity: Not on file  Other Topics Concern  . Not on file  Social History Narrative   ** Merged History Encounter **       Four adopted children.  Lives alone.       Review of Systems  All other systems reviewed and are negative.      Objective:   Physical Exam  Constitutional: He appears well-developed and well-nourished.  Cardiovascular: Normal rate.  Murmur heard. Pulmonary/Chest: Effort normal. He has no wheezes. He has no rales.  Abdominal: Soft. Bowel sounds are normal. He exhibits  no distension. There is no tenderness. There is no rebound.  Musculoskeletal: He exhibits edema.  Vitals reviewed.  See HPI       Assessment & Plan:  PAF (paroxysmal atrial fibrillation) ([place]) - Plan: BASIC METABOLIC PANEL WITH GFR, Protime-INR  Chronic systolic congestive heart failure ([place]) 'Weight is relatively stable compared to his last office visit.  The slight increase in his weight is due to his winter close.  Therefore he is essentially euvolemic as he was at his last visit taking Lasix 80 mg twice daily.  He does have significant pitting edema in his right leg however this is a chronic finding.  His left leg has only trace pitting edema in his lungs are relatively clear.  He denies any orthopnea or dyspnea on exertion.  Therefore I believe he is euvolemic and I will make no changes in his Lasix dose.  INR was checked today and was found to be 2.5 and therapeutic.  Continue Coumadin 4 mg a day and recheck INR in 6 weeks.  Continue Lasix 80 mg twice daily.  I will check a BMP while the patient is here to assess his renal function and potassium

## 2017-07-18 NOTE — Addendum Note (Signed)
Addended by: Shary Decamp B on: 07/18/2017 01:07 PM   Modules accepted: Orders

## 2017-08-02 IMAGING — CR DG CHEST 2V
2 series · 2 of 2 positions shown · non-contrast
Comparison: PA and lateral chest x-ray October 25, 2015

CLINICAL DATA: Patient underwent cardioversion on October 05, 2015 and
mitral valve repair on October 04, 2015 ; the patient reports shortness
of breath but no other complaints.

EXAM:
CHEST  2 VIEW

[w chest pa]
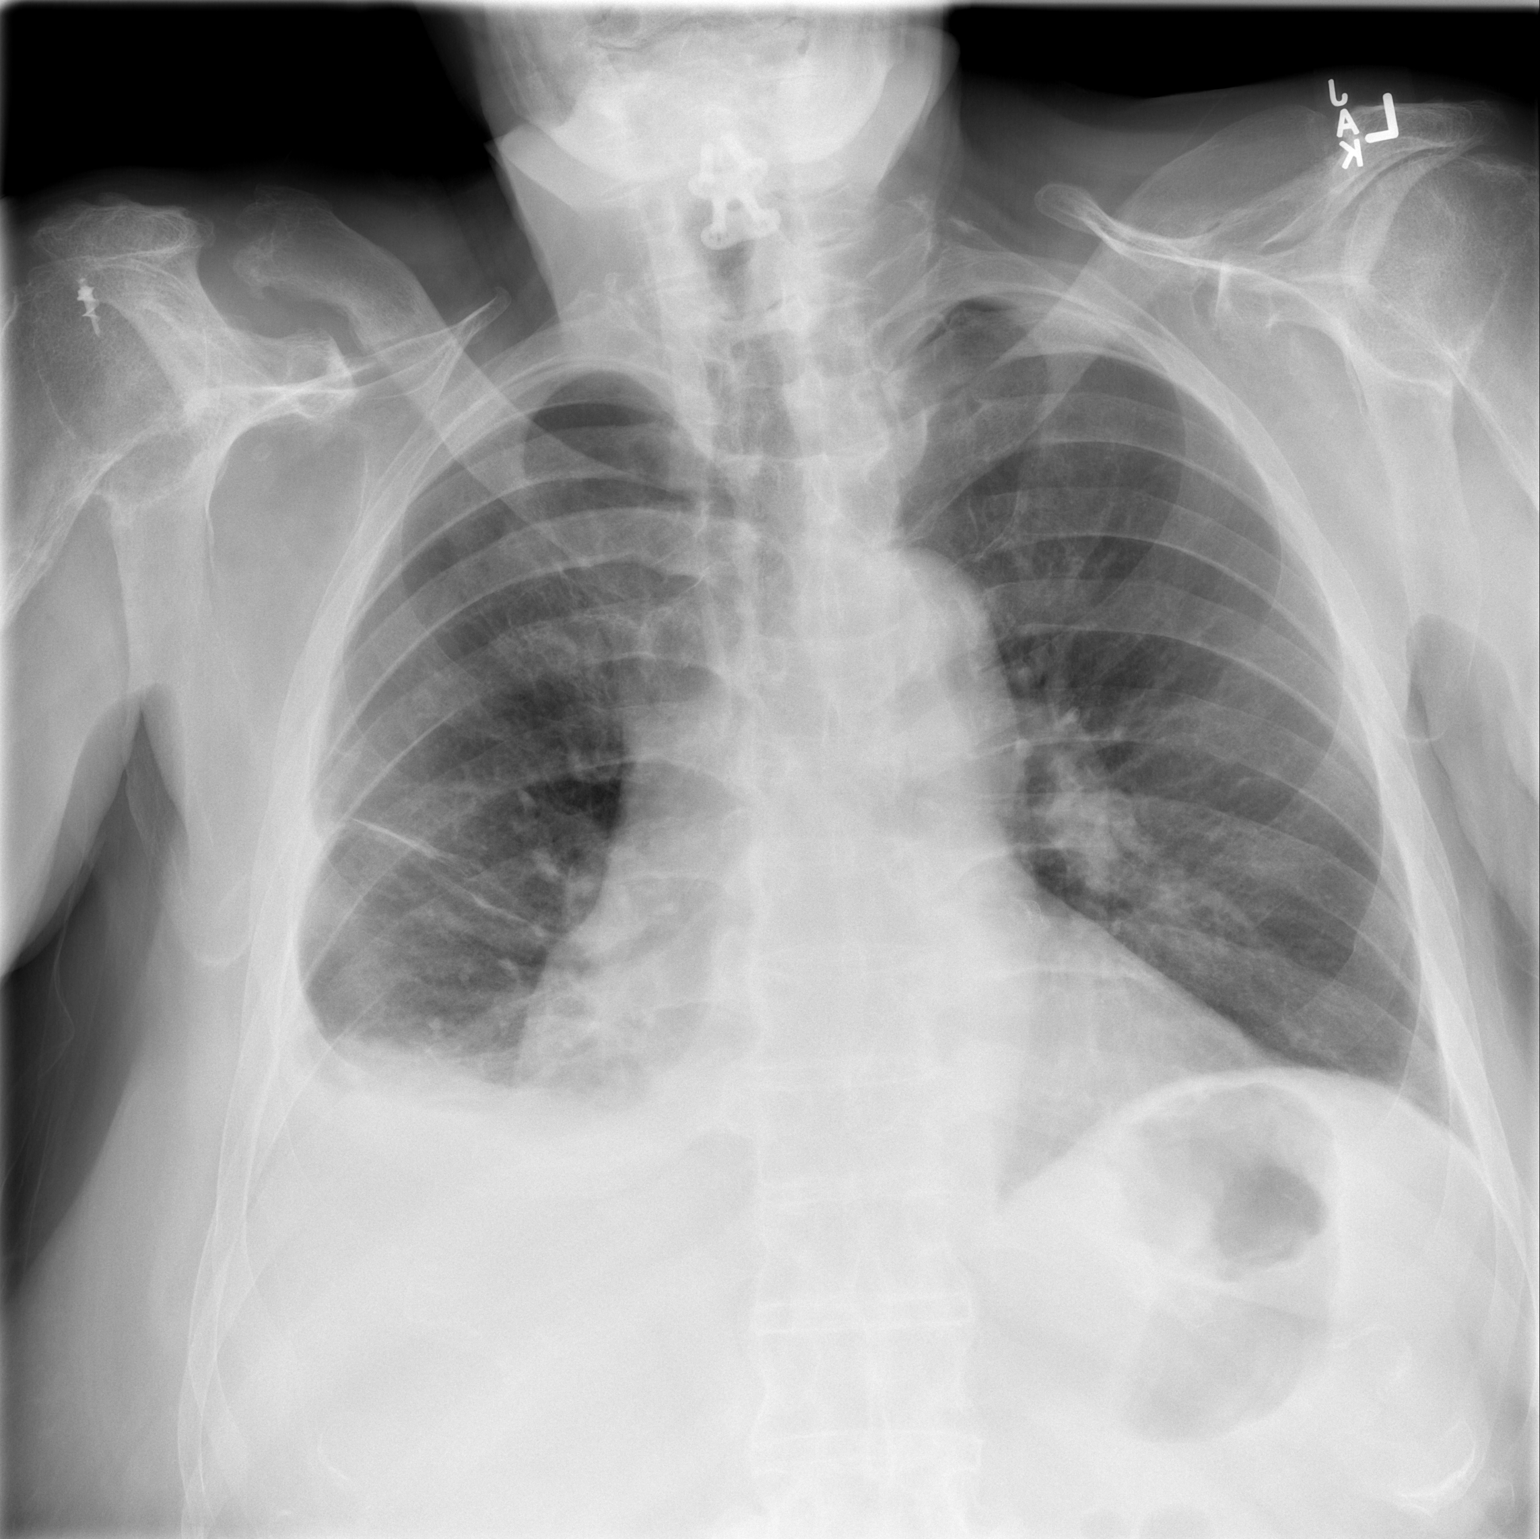

[w chest lat]
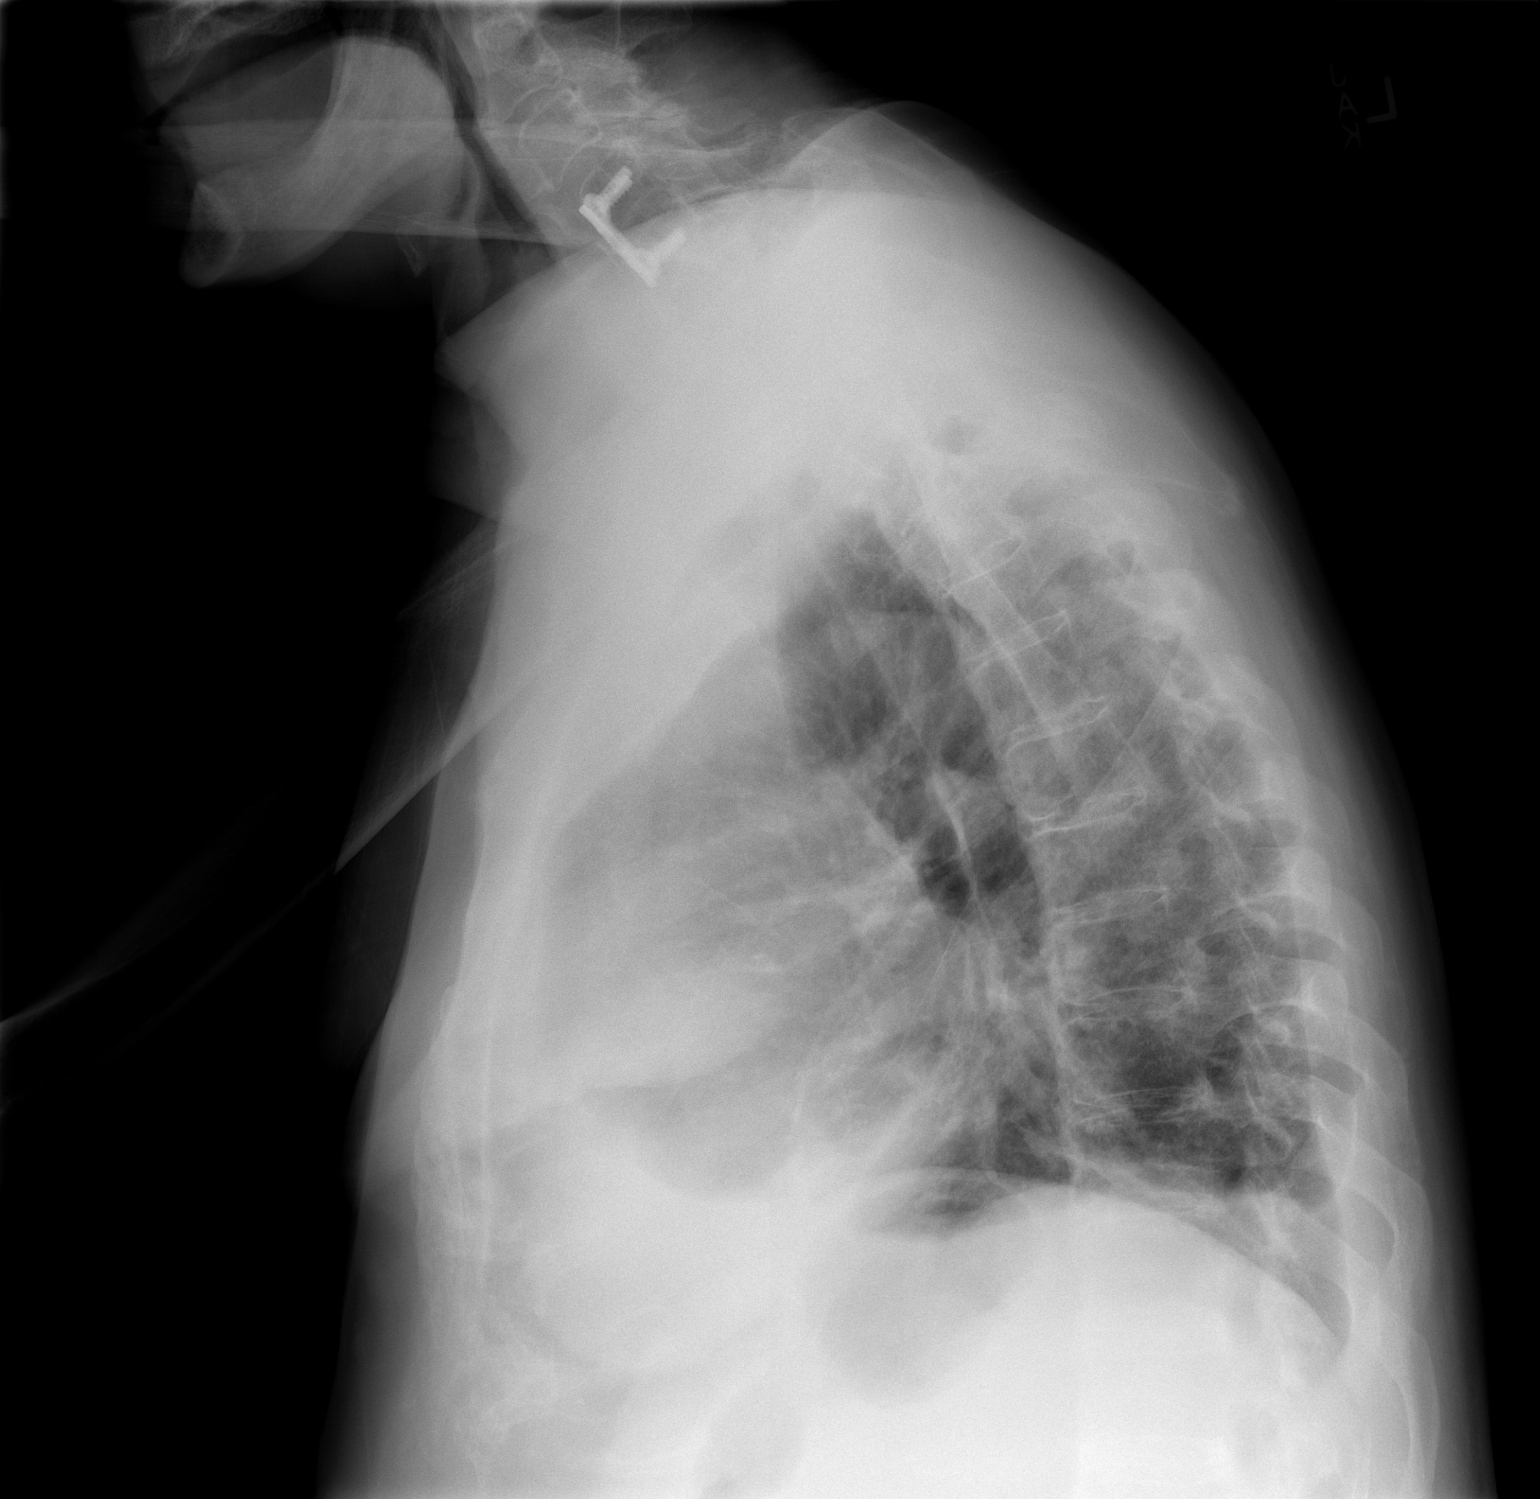

[2 of 2 positions shown; findings below may reference images not displayed]

FINDINGS: There remains a small right pleural effusion. The pulmonary
interstitium at the right lung base has partially cleared. The left
lung is clear. The cardiac silhouette remains enlarged. The
pulmonary vascularity is less engorged. The mediastinum is normal in
width. There are degenerative changes of both shoulders and AC
joints.
IMPRESSION: Improved appearance of the chest. Persistent small right pleural
effusion. Improving right basilar atelectasis or pneumonia.
Decreased pulmonary interstitial edema. Stable cardiomegaly.

## 2017-08-07 ENCOUNTER — Telehealth: Payer: Self-pay | Admitting: *Deleted

## 2017-08-07 NOTE — Telephone Encounter (Signed)
Received call from Gershon Crane with [facility] Cardiac Care Team.   Reports that patient is supposed to be weighing himself daily, but patient states that scale is not working correctly. Reports that the last time patient weighed was 07/07/2017 (31 days prior). Reports that weight today shows 13.6lb gain since last weigh in.   According to Sonia Baller, patient is reporting slightly increased SOB and weakness. OV scheduled and patient wife made aware.

## 2017-08-08 ENCOUNTER — Ambulatory Visit (INDEPENDENT_AMBULATORY_CARE_PROVIDER_SITE_OTHER): Payer: Medicare Other | Admitting: Family Medicine

## 2017-08-08 ENCOUNTER — Encounter: Payer: Self-pay | Admitting: Family Medicine

## 2017-08-08 VITALS — BP 130/68 | HR 80 | Temp 97.4°F | Resp 18 | Ht 67.0 in | Wt 229.0 lb

## 2017-08-08 DIAGNOSIS — I5032 Chronic diastolic (congestive) heart failure: Secondary | ICD-10-CM | POA: Diagnosis not present

## 2017-08-08 MED ORDER — SPIRONOLACTONE 25 MG PO TABS: 25.0000 mg | ORAL_TABLET | Freq: Every day | ORAL | 2 refills | Status: DC

## 2017-08-08 NOTE — Progress Notes (Signed)
Subjective:    Patient ID: [name], male    DOB: 04/26/1929, 84 y.o.   MRN: [id number]  HPI  05/29/17 Please see the previous office visit.   patient is currently taking Lasix 120 mg p.o. twice daily in addition to potassium chloride 40 mEq a day.  At his last visit, the patient weighed 224 pounds.  He has diuresed some and his weight is down to 218 pounds today although he still has +1-2 pitting edema in his right leg and +1 pitting edema in his left leg.  He also has faint bibasilar crackles and continues to endorse mild shortness of breath with exertion.  At that time, my plan was: Patient tends to appear fluid overloaded on exam.  Continue Lasix 120 mg p.o. twice daily in addition to potassium 40 mEq a day.  Check BMP to monitor potassium as well as renal function.  Recheck BNP as it was over 800 at his first visit.  Based on lab work, will determine the length of time to continue aggressive diuresis.  For now I will make no changes in his medication.  If renal function is worsening, we may have to proceed cautiously but for the time being I am planning on continuing the higher dose of Lasix through the weekend into next week.  06/02/17 Patient has lost an additional 3 pounds.  He states that his breathing is much better.  His dyspnea on exertion has improved.  The crackles and rales in his lower lungs have subsided.  His shortness of breath has improved.  He continues to have pitting edema in both legs right greater than left however a lot of this appears to be due to chronic venous insufficiency.  He is not wearing compression hose.  The edema in his legs does not bother him.  His renal function was deteriorating when I checked it last week making me hesitant to aggressively diurese the patient lower than today given that he is asymptomatic.   At that time, my plan was: Patient appears euvolemic although he does have pitting edema in both legs from the mid shin to his ankle which appears  to be edema secondary to chronic venous insufficiency rather than due to congestive heart failure at this point.  I believe we need to maintain his dry weight rather than to pursue further aggressive diuresis.  Decrease Lasix to 80 mg twice daily.  Continue potassium at 40 mEq p.o. daily and recheck a CMP.  Patient has a history of diabetes.  He is overdue for microalbumin as well as a hemoglobin A1c which I will check while he is here today  07/18/17 Patient is here today to recheck his INR.  His last INR was 2.1 at the end of October.  He is checking it every 6 weeks.  Currently on 4 mg of Coumadin every day.  He takes this for atrial fibrillation given his elevated chads score and his recent history of arterial embolization to his fingertips given his previous history of mitral valve repair.  He is also taking Lasix 80 mg twice daily.  Medicine list says 120 mg twice daily but the patient is actually taking 80 mg twice daily.  His weight is slightly higher than his last visit but he is also wearing a coat and have a close due to the recent snowstorm.  Patient states his weight is relatively unchanged since his last visit in October.  He has +2 pitting edema in his right leg  and only trace pitting edema in his left leg.  This is a chronic problem for the patient.  He has some mild basilar crackles in his lung but actually appears relatively euvolemic given his previous history.  He denies any shortness of breath or dyspnea on exertion.  At that time, my plan was: 'Weight is relatively stable compared to his last office visit.  The slight increase in his weight is due to his winter close.  Therefore he is essentially euvolemic as he was at his last visit taking Lasix 80 mg twice daily.  He does have significant pitting edema in his right leg however this is a chronic finding.  His left leg has only trace pitting edema in his lungs are relatively clear.  He denies any orthopnea or dyspnea on exertion.  Therefore  I believe he is euvolemic and I will make no changes in his Lasix dose.  INR was checked today and was found to be 2.5 and therapeutic.  Continue Coumadin 4 mg a day and recheck INR in 6 weeks.  Continue Lasix 80 mg twice daily.  I will check a BMP while the patient is here to assess his renal function and potassium  08/08/16 Since the patient's last visit, he has gained roughly 9 pounds. He now has +2 pitting edema in his right leg and +1 pitting edema in his left leg which is more than his baseline. He has bibasilar crackles and increasing shortness of breath and dyspnea on exertion. He is now winded with minimal activity. He denies any chest pain. He denies any orthopnea. He denies any paroxysmal nocturnal dyspnea. He has been taking Lasix 80 mg twice daily and potassium chloride 40 mEq daily Past Medical History:  Diagnosis Date  . Atelectasis of right lung   . Chronic diastolic (congestive) heart failure ([place])    a. 10/2013 Echo: EF 55-60%, basal-mid anteroseptal and basal-mid inferoseptal HK.  . Chronic fatigue   . Chronic subdural hematoma ([place])    a. 12/2015 Head CT: chronic right holo-hemispheric SDH w/o midline shift.  . Coronary artery disease    a. 11/2014 NSTEMI: DESx 2 placed to LAD and RCA; b. 08/2015 Cath: LAD patent stent, LCX 44m, RCA patent stent, 83m.  . Diabetes mellitus, type II ([place])    a. HgA1c 6.8 in 03/2015  . ED (erectile dysfunction)   . Gout   . Hemangioma of liver 08/02/2015   12 mm enhancing lesion seen on CT - suspicious for benign hemangioma but not diagnostic - MRI recommended  . HLD (hyperlipidemia)   . Hypertensive heart disease   . Kidney stones   . Lower extremity edema    a. chronic  . OA (osteoarthritis)   . Orthostatic hypotension   . PAF (paroxysmal atrial fibrillation) ([place])    a. 10/2015 s/p DCCV;  b. CHA2DS2VASc = 6--> on coumadin; c. 10/2015 Echo: sev dil LA.  Marland Kitchen Physical deconditioning   . Prostate cancer ([place])   . Pulmonary HTN ([place])   . Pulmonary  hypertension ([place])    a. 10/2015 Echo: PASP 67mmHg.  Marland Kitchen Severe mitral regurgitation    a.  10/2015 s/p minimally invasive MV repair; b. 10/2015 Echo: mod MS, mean grad 65mmHg.  Marland Kitchen Spinal stenosis   . SVT (supraventricular tachycardia) ([facility])    a. recurrent, usually responsive to vagal manuevers  . Tricuspid regurgitation    a. 10/2015 Echo: mild to mod TR.   Past Surgical History:  Procedure Laterality Date  . BACK  SURGERY    . Back surgery x 2    . CARDIAC CATHETERIZATION N/A 08/08/2015   Procedure: Right/Left Heart Cath and Coronary Angiography;  Surgeon: Sherren Mocha, MD;  Location: [place] [facility];  Service: Cardiovascular;  Laterality: N/A;  . CARDIAC SURGERY     2 cardiac stents  . CARDIOVERSION N/A 10/20/2015   Procedure: CARDIOVERSION;  Surgeon: Pixie Casino, MD;  Location: [facility];  Service: Cardiovascular;  Laterality: N/A;  . CERVICAL SPINE SURGERY    . CHOLECYSTECTOMY N/A 07/08/2016   Procedure: LAPAROSCOPIC CHOLECYSTECTOMY WITH INTRAOPERATIVE CHOLANGIOGRAM;  Surgeon: Georganna Skeans, MD;  Location: [facility];  Service: General;  Laterality: N/A;  . EYE SURGERY    . INTRAOPERATIVE TRANSESOPHAGEAL ECHOCARDIOGRAM  10/05/2015   Procedure: INTRAOPERATIVE TRANSESOPHAGEAL ECHOCARDIOGRAM;  Surgeon: Rexene Alberts, MD;  Location[facility] OR;  Service: Open Heart Surgery;;  . JOINT REPLACEMENT     shoulder  . LEFT HEART CATHETERIZATION WITH CORONARY ANGIOGRAM N/A 11/16/2014   Procedure: LEFT HEART CATHETERIZATION WITH CORONARY ANGIOGRAM;  Surgeon: Troy Sine, MD;  Location: [facility] CATH LAB;  Service: Cardiovascular;  Laterality: N/A;  . MITRAL VALVE REPAIR Right 10/04/2015   Procedure: MINIMALLY INVASIVE MITRAL VALVE REPAIR (MVR);  Surgeon: Rexene Alberts, MD;  Location: [place];  Service: Open Heart Surgery;  Laterality: Right;  . Rt rotator cuff repair    . TEE WITHOUT CARDIOVERSION N/A 06/22/2015   Procedure: TRANSESOPHAGEAL ECHOCARDIOGRAM (TEE);  Surgeon: Skeet Latch, MD;  Location:  [place];  Service: Cardiovascular;  Laterality: N/A;  . TEE WITHOUT CARDIOVERSION N/A 10/04/2015   Procedure: TRANSESOPHAGEAL ECHOCARDIOGRAM (TEE);  Surgeon: Rexene Alberts, MD;  Location: [place];  Service: Open Heart Surgery;  Laterality: N/A;  . WOUND EXPLORATION N/A 10/05/2015   Procedure: Sternal Incision;  Surgeon: Rexene Alberts, MD;  Location: [place];  Service: Open Heart Surgery;  Laterality: N/A;   Current Outpatient Medications on File Prior to Visit  Medication Sig Dispense Refill  . allopurinol (ZYLOPRIM) 100 MG tablet TAKE ONE (1) TABLET EACH DAY 90 tablet 3  . colchicine 0.6 MG tablet Take 1 tablet (0.6 mg total) by mouth daily. 30 tablet 5  . finasteride (PROSCAR) 5 MG tablet TAKE ONE (1) TABLET BY MOUTH EVERY DAY 30 tablet 5  . furosemide (LASIX) 80 MG tablet Take 1 tablet (80 mg total) by mouth 2 (two) times daily. 60 tablet 3  . magnesium oxide (MAG-OX) 400 MG tablet Take 1 tablet (400 mg total) by mouth daily. 30 tablet 0  . metoprolol tartrate (LOPRESSOR) 25 MG tablet TAKE ONE-HALF TABLET BY MOUTH TWICE DAILY 30 tablet 5  . OVER THE COUNTER MEDICATION Neuroquell: Take 1 capsule by mouth once a day for immune health    . potassium chloride (KLOR-CON) 20 MEQ packet Take 40 mEq by mouth daily. 60 packet 0  . pravastatin (PRAVACHOL) 40 MG tablet TAKE ONE (1) TABLET EACH DAY AT 6 P.M. 30 tablet 10  . traMADol (ULTRAM) 50 MG tablet Take 1 tablet (50 mg total) by mouth every 6 (six) hours as needed for moderate pain. 30 tablet 0  . warfarin (COUMADIN) 4 MG tablet Take 1 tablet (4 mg total) by mouth daily. Do not take with any other warfarin unless Dr. Dennard Schaumann instructs. 30 tablet 3  . Wheat Dextrin (BENEFIBER) POWD Take by mouth See admin instructions. Mix one teaspoonful into 6-8 ounces of fluid/milk and drink once daily     No current facility-administered medications on file prior to  visit.    No Known Allergies Social History   Socioeconomic History  . Marital status:  Married    Spouse name: Not on file  . Number of children: 4  . Years of education: Not on file  . Highest education level: Not on file  Social Needs  . Financial resource strain: Not on file  . Food insecurity - worry: Not on file  . Food insecurity - inability: Not on file  . Transportation needs - medical: Not on file  . Transportation needs - non-medical: Not on file  Occupational History    Employer: RETIRED  Tobacco Use  . Smoking status: Former Smoker    Types: Cigarettes, Pipe, Landscape architect  . Smokeless tobacco: Former Systems developer    Types: Chew  . Tobacco comment: QUIT SMOKING  MANY YEARS AGO"  Substance and Sexual Activity  . Alcohol use: No  . Drug use: No  . Sexual activity: Not on file  Other Topics Concern  . Not on file  Social History Narrative   ** Merged History Encounter **       Four adopted children.  Lives alone.       Review of Systems  All other systems reviewed and are negative.      Objective:   Physical Exam  Constitutional: He appears well-developed and well-nourished.  Cardiovascular: Normal rate.  Murmur heard. Pulmonary/Chest: Effort normal. He has no wheezes. He has no rales.  Abdominal: Soft. Bowel sounds are normal. He exhibits no distension. There is no tenderness. There is no rebound.  Musculoskeletal: He exhibits edema.  Vitals reviewed.  See HPI       Assessment & Plan:  Chronic diastolic CHF (congestive heart failure) (HCC) Increase Lasix to 120 mg by mouth twice a day, continue potassium chloride 40 mEq daily, add spironolactone 25 mg daily. Recheck on Monday and obtain a BMP at that time. His potassium level in December was 3.6 on his current regimen

## 2017-08-12 ENCOUNTER — Other Ambulatory Visit: Payer: Self-pay | Admitting: Family Medicine

## 2017-08-12 ENCOUNTER — Encounter: Payer: Self-pay | Admitting: Family Medicine

## 2017-08-12 ENCOUNTER — Ambulatory Visit (INDEPENDENT_AMBULATORY_CARE_PROVIDER_SITE_OTHER): Payer: Medicare Other | Admitting: Family Medicine

## 2017-08-12 VITALS — BP 120/68 | HR 78 | Temp 98.4°F | Resp 16 | Ht 67.0 in | Wt 226.0 lb

## 2017-08-12 DIAGNOSIS — I5032 Chronic diastolic (congestive) heart failure: Secondary | ICD-10-CM

## 2017-08-12 LAB — BASIC METABOLIC PANEL WITH GFR
BUN/Creatinine Ratio: 17 (calc) (ref 6–22)
BUN: 22 mg/dL (ref 7–25)
CO2: 30 mmol/L (ref 20–32)
CREATININE: 1.29 mg/dL — AB (ref 0.70–1.11)
Calcium: 8.6 mg/dL (ref 8.6–10.3)
Chloride: 99 mmol/L (ref 98–110)
GFR, EST AFRICAN AMERICAN: 58 mL/min/{1.73_m2} — AB (ref >=60)
GFR, EST NON AFRICAN AMERICAN: 50 mL/min/{1.73_m2} — AB (ref >=60)
Glucose, Bld: 201 mg/dL — ABNORMAL HIGH (ref 65–99)
POTASSIUM: 3.6 mmol/L (ref 3.5–5.3)
SODIUM: 138 mmol/L (ref 135–146)

## 2017-08-12 MED ORDER — HYDROCODONE-HOMATROPINE 5-1.5 MG/5ML PO SYRP: 5.0000 mL | ORAL_SOLUTION | Freq: Three times a day (TID) | ORAL | 0 refills | Status: DC | PRN

## 2017-08-12 NOTE — Progress Notes (Signed)
Subjective:    Patient ID: [name], male    DOB: 1929/04/18, 84 y.o.   MRN: [id number]  HPI  05/29/17 Please see the previous office visit.   patient is currently taking Lasix 120 mg p.o. twice daily in addition to potassium chloride 40 mEq a day.  At his last visit, the patient weighed 224 pounds.  He has diuresed some and his weight is down to 218 pounds today although he still has +1-2 pitting edema in his right leg and +1 pitting edema in his left leg.  He also has faint bibasilar crackles and continues to endorse mild shortness of breath with exertion.  At that time, my plan was: Patient tends to appear fluid overloaded on exam.  Continue Lasix 120 mg p.o. twice daily in addition to potassium 40 mEq a day.  Check BMP to monitor potassium as well as renal function.  Recheck BNP as it was over 800 at his first visit.  Based on lab work, will determine the length of time to continue aggressive diuresis.  For now I will make no changes in his medication.  If renal function is worsening, we may have to proceed cautiously but for the time being I am planning on continuing the higher dose of Lasix through the weekend into next week.  06/02/17 Patient has lost an additional 3 pounds.  He states that his breathing is much better.  His dyspnea on exertion has improved.  The crackles and rales in his lower lungs have subsided.  His shortness of breath has improved.  He continues to have pitting edema in both legs right greater than left however a lot of this appears to be due to chronic venous insufficiency.  He is not wearing compression hose.  The edema in his legs does not bother him.  His renal function was deteriorating when I checked it last week making me hesitant to aggressively diurese the patient lower than today given that he is asymptomatic.   At that time, my plan was: Patient appears euvolemic although he does have pitting edema in both legs from the mid shin to his ankle which appears  to be edema secondary to chronic venous insufficiency rather than due to congestive heart failure at this point.  I believe we need to maintain his dry weight rather than to pursue further aggressive diuresis.  Decrease Lasix to 80 mg twice daily.  Continue potassium at 40 mEq p.o. daily and recheck a CMP.  Patient has a history of diabetes.  He is overdue for microalbumin as well as a hemoglobin A1c which I will check while he is here today  07/18/17 Patient is here today to recheck his INR.  His last INR was 2.1 at the end of October.  He is checking it every 6 weeks.  Currently on 4 mg of Coumadin every day.  He takes this for atrial fibrillation given his elevated chads score and his recent history of arterial embolization to his fingertips given his previous history of mitral valve repair.  He is also taking Lasix 80 mg twice daily.  Medicine list says 120 mg twice daily but the patient is actually taking 80 mg twice daily.  His weight is slightly higher than his last visit but he is also wearing a coat and have a close due to the recent snowstorm.  Patient states his weight is relatively unchanged since his last visit in October.  He has +2 pitting edema in his right leg  and only trace pitting edema in his left leg.  This is a chronic problem for the patient.  He has some mild basilar crackles in his lung but actually appears relatively euvolemic given his previous history.  He denies any shortness of breath or dyspnea on exertion.  At that time, my plan was: 'Weight is relatively stable compared to his last office visit.  The slight increase in his weight is due to his winter close.  Therefore he is essentially euvolemic as he was at his last visit taking Lasix 80 mg twice daily.  He does have significant pitting edema in his right leg however this is a chronic finding.  His left leg has only trace pitting edema in his lungs are relatively clear.  He denies any orthopnea or dyspnea on exertion.  Therefore  I believe he is euvolemic and I will make no changes in his Lasix dose.  INR was checked today and was found to be 2.5 and therapeutic.  Continue Coumadin 4 mg a day and recheck INR in 6 weeks.  Continue Lasix 80 mg twice daily.  I will check a BMP while the patient is here to assess his renal function and potassium  08/08/16 Since the patient's last visit, he has gained roughly 9 pounds. He now has +2 pitting edema in his right leg and +1 pitting edema in his left leg which is more than his baseline. He has bibasilar crackles and increasing shortness of breath and dyspnea on exertion. He is now winded with minimal activity. He denies any chest pain. He denies any orthopnea. He denies any paroxysmal nocturnal dyspnea. He has been taking Lasix 80 mg twice daily and potassium chloride 40 mEq daily.  At that time, my plan was: Increase Lasix to 120 mg by mouth twice a day, continue potassium chloride 40 mEq daily, add spironolactone 25 mg daily. Recheck on Monday and obtain a BMP at that time. His potassium level in December was 3.6 on his current regimen  08/12/16 Patient slowly seems to be improving. His weight is down 3 pounds since when I saw him 4 days ago. He continues to have significant pitting edema in his right leg which is chronic and trace pitting edema in his left leg. On exam, he also has bibasilar crackles left slightly worse than right.  However the patient feels that his breathing is at his baseline. He denies any new dyspnea on exertion and he denies any chest pain. He recently developed a cold over the last 24 hours. Symptoms include head congestion and a nonproductive cough. He denies any orthopnea. Cough is more of a tickle in his throat. He has used some of his wife's Hycodan for the cough and it seemed to help and he would like a prescription of his own Past Medical History:  Diagnosis Date  . Atelectasis of right lung   . Chronic diastolic (congestive) heart failure ([place])    a. 10/2013  Echo: EF 55-60%, basal-mid anteroseptal and basal-mid inferoseptal HK.  . Chronic fatigue   . Chronic subdural hematoma ([place])    a. 12/2015 Head CT: chronic right holo-hemispheric SDH w/o midline shift.  . Coronary artery disease    a. 11/2014 NSTEMI: DESx 2 placed to LAD and RCA; b. 08/2015 Cath: LAD patent stent, LCX 68m, RCA patent stent, 27m.  . Diabetes mellitus, type II ([place])    a. HgA1c 6.8 in 03/2015  . ED (erectile dysfunction)   . Gout   . Hemangioma of  liver 08/02/2015   12 mm enhancing lesion seen on CT - suspicious for benign hemangioma but not diagnostic - MRI recommended  . HLD (hyperlipidemia)   . Hypertensive heart disease   . Kidney stones   . Lower extremity edema    a. chronic  . OA (osteoarthritis)   . Orthostatic hypotension   . PAF (paroxysmal atrial fibrillation) ([place])    a. 10/2015 s/p DCCV;  b. CHA2DS2VASc = 6--> on coumadin; c. 10/2015 Echo: sev dil LA.  Marland Kitchen Physical deconditioning   . Prostate cancer ([place])   . Pulmonary HTN ([place])   . Pulmonary hypertension ([place])    a. 10/2015 Echo: PASP 60mmHg.  Marland Kitchen Severe mitral regurgitation    a.  10/2015 s/p minimally invasive MV repair; b. 10/2015 Echo: mod MS, mean grad 71mmHg.  Marland Kitchen Spinal stenosis   . SVT (supraventricular tachycardia) ([facility])    a. recurrent, usually responsive to vagal manuevers  . Tricuspid regurgitation    a. 10/2015 Echo: mild to mod TR.   Past Surgical History:  Procedure Laterality Date  . BACK SURGERY    . Back surgery x 2    . CARDIAC CATHETERIZATION N/A 08/08/2015   Procedure: Right/Left Heart Cath and Coronary Angiography;  Surgeon: Sherren Mocha, MD;  Location: [facility];  Service: Cardiovascular;  Laterality: N/A;  . CARDIAC SURGERY     2 cardiac stents  . CARDIOVERSION N/A 10/20/2015   Procedure: CARDIOVERSION;  Surgeon: Pixie Casino, MD;  Location: [facility];  Service: Cardiovascular;  Laterality: N/A;  . CERVICAL SPINE SURGERY    . CHOLECYSTECTOMY N/A 07/08/2016   Procedure:  LAPAROSCOPIC CHOLECYSTECTOMY WITH INTRAOPERATIVE CHOLANGIOGRAM;  Surgeon: Georganna Skeans, MD;  Location: [facility];  Service: General;  Laterality: N/A;  . EYE SURGERY    . INTRAOPERATIVE TRANSESOPHAGEAL ECHOCARDIOGRAM  10/05/2015   Procedure: INTRAOPERATIVE TRANSESOPHAGEAL ECHOCARDIOGRAM;  Surgeon: Rexene Alberts, MD;  Location: [facility] OR;  Service: Open Heart Surgery;;  . JOINT REPLACEMENT     shoulder  . LEFT HEART CATHETERIZATION WITH CORONARY ANGIOGRAM N/A 11/16/2014   Procedure: LEFT HEART CATHETERIZATION WITH CORONARY ANGIOGRAM;  Surgeon: Troy Sine, MD;  Location[facility] CATH LAB;  Service: Cardiovascular;  Laterality: N/A;  . MITRAL VALVE REPAIR Right 10/04/2015   Procedure: MINIMALLY INVASIVE MITRAL VALVE REPAIR (MVR);  Surgeon: Rexene Alberts, MD;  Location: [facility];  Service: Open Heart Surgery;  Laterality: Right;  . Rt rotator cuff repair    . TEE WITHOUT CARDIOVERSION N/A 06/22/2015   Procedure: TRANSESOPHAGEAL ECHOCARDIOGRAM (TEE);  Surgeon: Skeet Latch, MD;  Location: [facility];  Service: Cardiovascular;  Laterality: N/A;  . TEE WITHOUT CARDIOVERSION N/A 10/04/2015   Procedure: TRANSESOPHAGEAL ECHOCARDIOGRAM (TEE);  Surgeon: Rexene Alberts, MD;  Location: [facility];  Service: Open Heart Surgery;  Laterality: N/A;  . WOUND EXPLORATION N/A 10/05/2015   Procedure: Sternal Incision;  Surgeon: Rexene Alberts, MD;  Location: [facility];  Service: Open Heart Surgery;  Laterality: N/A;   Current Outpatient Medications on File Prior to Visit  Medication Sig Dispense Refill  . allopurinol (ZYLOPRIM) 100 MG tablet TAKE ONE (1) TABLET EACH DAY 90 tablet 3  . colchicine 0.6 MG tablet Take 1 tablet (0.6 mg total) by mouth daily. 30 tablet 5  . finasteride (PROSCAR) 5 MG tablet TAKE ONE (1) TABLET BY MOUTH EVERY DAY 30 tablet 5  . furosemide (LASIX) 80 MG tablet Take 1 tablet (80 mg total) by mouth 2 (two) times daily. (Patient taking differently: Take  120 mg by mouth 2 (two) times daily. ) 60 tablet 3  .  magnesium oxide (MAG-OX) 400 MG tablet Take 1 tablet (400 mg total) by mouth daily. 30 tablet 0  . metoprolol tartrate (LOPRESSOR) 25 MG tablet TAKE ONE-HALF TABLET BY MOUTH TWICE DAILY 30 tablet 5  . OVER THE COUNTER MEDICATION Neuroquell: Take 1 capsule by mouth once a day for immune health    . potassium chloride (KLOR-CON) 20 MEQ packet Take 40 mEq by mouth daily. 60 packet 0  . spironolactone (ALDACTONE) 25 MG tablet Take 1 tablet (25 mg total) by mouth daily. 30 tablet 2  . traMADol (ULTRAM) 50 MG tablet Take 1 tablet (50 mg total) by mouth every 6 (six) hours as needed for moderate pain. 30 tablet 0  . warfarin (COUMADIN) 4 MG tablet Take 1 tablet (4 mg total) by mouth daily. Do not take with any other warfarin unless Dr. Dennard Schaumann instructs. 30 tablet 3  . Wheat Dextrin (BENEFIBER) POWD Take by mouth See admin instructions. Mix one teaspoonful into 6-8 ounces of fluid/milk and drink once daily     No current facility-administered medications on file prior to visit.    No Known Allergies Social History   Socioeconomic History  . Marital status: Married    Spouse name: Not on file  . Number of children: 4  . Years of education: Not on file  . Highest education level: Not on file  Social Needs  . Financial resource strain: Not on file  . Food insecurity - worry: Not on file  . Food insecurity - inability: Not on file  . Transportation needs - medical: Not on file  . Transportation needs - non-medical: Not on file  Occupational History    Employer: RETIRED  Tobacco Use  . Smoking status: Former Smoker    Types: Cigarettes, Pipe, Landscape architect  . Smokeless tobacco: Former Systems developer    Types: Chew  . Tobacco comment: QUIT SMOKING  MANY YEARS AGO"  Substance and Sexual Activity  . Alcohol use: No  . Drug use: No  . Sexual activity: Not on file  Other Topics Concern  . Not on file  Social History Narrative   ** Merged History Encounter **       Four adopted children.  Lives alone.        Review of Systems  All other systems reviewed and are negative.      Objective:   Physical Exam  Constitutional: He appears well-developed and well-nourished.  Cardiovascular: Normal rate.  Murmur heard. Pulmonary/Chest: Effort normal. He has no wheezes. He has no rales.  Abdominal: Soft. Bowel sounds are normal. He exhibits no distension. There is no tenderness. There is no rebound.  Musculoskeletal: He exhibits edema.  Vitals reviewed.  See HPI       Assessment & Plan:  Chronic diastolic CHF (congestive heart failure) ([place]) - Plan: BASIC METABOLIC PANEL WITH GFR Patient slowly seems to be diuresing. I would like to recheck him in one week. Meanwhile today make no changes in his medication and recheck a BMP to monitor his renal function and potassium on elevated dose of Lasix as well as the new dose of spironolactone. Changes may need to be made in his potassium depending upon the lab results. I did give him a prescription for Hycodan but I cautioned him to use this sparingly and only as needed for an upper respiratory infection. Do not believe the cough is related to pulmonary edema. However if the  patient develops worsening shortness of breath or fever, he is to call me immediately. Otherwise recheck in one week

## 2017-08-15 ENCOUNTER — Other Ambulatory Visit: Payer: Self-pay | Admitting: *Deleted

## 2017-08-15 ENCOUNTER — Encounter: Payer: Self-pay | Admitting: *Deleted

## 2017-08-15 NOTE — Patient Outreach (Signed)
[facility]) Care Management  08/15/2017   [name] 1928-10-05 [id number]  Subjective: RN Health Coach telephone call to patient.  [employer] compliance verified. Per patient he is still having swelling in extremities. Patient is short of breath with exertion. Patient stated he is taking his medication as prescribed. Per patient he is keeping his feet elevated. Patient uses a cane. Per patient he topples over if he bends over if not using his cane. Per patient he does have hearing aids and have a new pair of glasses. Patient stated he does having an advance directive , but requested an advance living packet. This patient is in the yellow zone. He went to the doctor on [phone]. He is aware to call the Dr if the symptoms worsen. Patient is to see the doctor next week. Patient has agreed to follow up outreach calls.   Current Medications:  Current Outpatient Medications  Medication Sig Dispense Refill  . allopurinol (ZYLOPRIM) 100 MG tablet TAKE ONE (1) TABLET EACH DAY 90 tablet 3  . colchicine 0.6 MG tablet Take 1 tablet (0.6 mg total) by mouth daily. 30 tablet 5  . finasteride (PROSCAR) 5 MG tablet TAKE ONE (1) TABLET BY MOUTH EVERY DAY 30 tablet 5  . furosemide (LASIX) 80 MG tablet Take 1 tablet (80 mg total) by mouth 2 (two) times daily. (Patient taking differently: Take 120 mg by mouth 2 (two) times daily. ) 60 tablet 3  . HYDROcodone-homatropine (HYCODAN) 5-1.5 MG/5ML syrup Take 5 mLs by mouth every 8 (eight) hours as needed for cough. 120 mL 0  . magnesium oxide (MAG-OX) 400 MG tablet Take 1 tablet (400 mg total) by mouth daily. 30 tablet 0  . metoprolol tartrate (LOPRESSOR) 25 MG tablet TAKE ONE-HALF TABLET BY MOUTH TWICE DAILY 30 tablet 5  . OVER THE COUNTER MEDICATION Neuroquell: Take 1 capsule by mouth once a day for immune health    . potassium chloride (KLOR-CON) 20 MEQ packet Take 40 mEq by mouth daily. 60 packet 0  . pravastatin (PRAVACHOL) 40 MG tablet TAKE ONE  (1) TABLET EACH DAY AT 6 P.M. 30 tablet 9  . spironolactone (ALDACTONE) 25 MG tablet Take 1 tablet (25 mg total) by mouth daily. 30 tablet 2  . traMADol (ULTRAM) 50 MG tablet Take 1 tablet (50 mg total) by mouth every 6 (six) hours as needed for moderate pain. 30 tablet 0  . warfarin (COUMADIN) 4 MG tablet Take 1 tablet (4 mg total) by mouth daily. Do not take with any other warfarin unless Dr. Dennard Schaumann instructs. 30 tablet 3  . Wheat Dextrin (BENEFIBER) POWD Take by mouth See admin instructions. Mix one teaspoonful into 6-8 ounces of fluid/milk and drink once daily     No current facility-administered medications for this visit.     Functional Status:  In your present state of health, do you have any difficulty performing the following activities: 08/15/2017 11/07/2016  Hearing? Y Y  Comment per patient he has hearing aids -  Vision? N N  Difficulty concentrating or making decisions? N N  Walking or climbing stairs? Y Y  Dressing or bathing? Y N  Doing errands, shopping? Tempie Donning  Preparing Food and eating ? Y -  Using the Toilet? N -  In the past six months, have you accidently leaked urine? N -  Do you have problems with loss of bowel control? N -  Managing your Medications? N -  Managing your Finances? N -  Housekeeping or  managing your Housekeeping? Y -  Some recent data might be hidden    Fall/Depression Screening: Fall Risk  08/15/2017 06/13/2017 04/22/2017  Falls in the past year? Yes Yes Yes  Number falls in past yr: 1 1 2  or more  Injury with Fall? Yes Yes Yes  Risk Factor Category  High Fall Risk - High Fall Risk  Risk for fall due to : History of fall(s);Impaired balance/gait;Impaired mobility History of fall(s) -  Follow up Falls evaluation completed;Falls prevention discussed;Education provided Falls prevention discussed Falls evaluation completed   PHQ 2/9 Scores 08/15/2017 06/13/2017 04/22/2017 09/13/2016 07/23/2016 12/01/2015 11/09/2015  PHQ - 2 Score 0 0 3 0 0 0 0  PHQ- 9 Score -  - 9 - - - -   THN CM Care Plan Problem One     Most Recent Value  Care Plan Problem One  Knowledge Deficit in Self Management of Congestive Heart Failure  Role Documenting the Problem One  [place] for Problem One  Active  THN Long Term Goal   Patient will not have any readmissions for congestive heart failure within the next 90 days  THN Long Term Goal Start Date  08/15/17  Interventions for Problem One Long Term Goal  RN discussed with patient about keeping Dr appointments. RN discussed contacting Dr if symptoms  are worse. RN discussed taking medications as per ordered and decreasing sodium in diet. RN will follow up outreach for further discussion  THN CM Short Term Goal #1   Patient will report weighing weighing within the next 30 days  THN CM Short Term Goal #1 Start Date  08/15/17  Interventions for Short Term Goal #1  RN discussed patient weighing daily. RN sent a 2019 calendar book for patient to document weights. RN sent educational material on the importance of weighing. RN will follow up with further discussion and teach back.  THN CM Short Term Goal #2   Patient will report reviewing educational material on low sodium diet within the next 30 days  THN CM Short Term Goal #2 Start Date  08/15/17  Interventions for Short Term Goal #2  RN discussed eating foods low in sodium. RN sent a picture chart of foods low and high in sodium. RN sent educational material on low sodium diet. Rn will follow up with further discussion and teachback  THN CM Short Term Goal #3  Pat[place] will report receiving additional information on advance directive within the next 30 days  THN CM Short Term Goal #3 Start Date  08/15/17  Interventions for Short Tern Goal #3  RN discussed advance directive with patient. Patient requested to be sent a packet to compare with what he has. RN will follow up with further discussion.  THN CM Short Term Goal #4  patient will report reviewing educational  informationon falls prevention within the next 30 days  THN CM Short Term Goal #4 Start Date  08/15/17  Interventions for Short Term Goal #4  RN discussed with patienton  falls and fall prevention. Patient uses a cane at all time. RN sent educational material on safety at home      Assessment:  Patient is in the yellow zone Patient has edema in extremities Patient is short of breath on minimal exertion Per patient he is eating no low sodium to no salt foods Per patient is taking diuretics as ordered Patient has an advance directive but requested another advanced directive packet to compare information Plan: RN discussed Advance  Directives RN sent Advance directive RN sent 2019 calendar book RN sent Congestive Heart Failure packet RN sent picture chart of low sodium foods RN sent EMMI educational material on safety at home RN discussed the importance of weighing RN will follow up within the month of February RN sent PCP assessment and barriers letter  [place] Management [phone]

## 2017-08-19 ENCOUNTER — Encounter: Payer: Self-pay | Admitting: Family Medicine

## 2017-08-19 ENCOUNTER — Other Ambulatory Visit: Payer: Self-pay | Admitting: Family Medicine

## 2017-08-19 ENCOUNTER — Ambulatory Visit (INDEPENDENT_AMBULATORY_CARE_PROVIDER_SITE_OTHER): Payer: Medicare Other | Admitting: Family Medicine

## 2017-08-19 VITALS — BP 120/60 | HR 70 | Temp 97.9°F | Resp 18 | Ht 67.0 in | Wt 218.0 lb

## 2017-08-19 DIAGNOSIS — I480 Paroxysmal atrial fibrillation: Secondary | ICD-10-CM

## 2017-08-19 DIAGNOSIS — I5032 Chronic diastolic (congestive) heart failure: Secondary | ICD-10-CM | POA: Diagnosis not present

## 2017-08-19 LAB — PT WITH INR/FINGERSTICK
INR, fingerstick: 2.9 ratio — ABNORMAL HIGH
PT FINGERSTICK: 34.9 s — AB (ref 10.5–13.1)

## 2017-08-19 MED ORDER — FUROSEMIDE 40 MG PO TABS: 120.0000 mg | ORAL_TABLET | Freq: Two times a day (BID) | ORAL | 3 refills | Status: DC

## 2017-08-19 NOTE — Progress Notes (Signed)
Subjective:    Patient ID: [name], male    DOB: 1928/08/06, 84 y.o.   MRN: [id number]  HPI  05/29/17 Please see the previous office visit.   patient is currently taking Lasix 120 mg p.o. twice daily in addition to potassium chloride 40 mEq a day.  At his last visit, the patient weighed 224 pounds.  He has diuresed some and his weight is down to 218 pounds today although he still has +1-2 pitting edema in his right leg and +1 pitting edema in his left leg.  He also has faint bibasilar crackles and continues to endorse mild shortness of breath with exertion.  At that time, my plan was: Patient tends to appear fluid overloaded on exam.  Continue Lasix 120 mg p.o. twice daily in addition to potassium 40 mEq a day.  Check BMP to monitor potassium as well as renal function.  Recheck BNP as it was over 800 at his first visit.  Based on lab work, will determine the length of time to continue aggressive diuresis.  For now I will make no changes in his medication.  If renal function is worsening, we may have to proceed cautiously but for the time being I am planning on continuing the higher dose of Lasix through the weekend into next week.  06/02/17 Patient has lost an additional 3 pounds.  He states that his breathing is much better.  His dyspnea on exertion has improved.  The crackles and rales in his lower lungs have subsided.  His shortness of breath has improved.  He continues to have pitting edema in both legs right greater than left however a lot of this appears to be due to chronic venous insufficiency.  He is not wearing compression hose.  The edema in his legs does not bother him.  His renal function was deteriorating when I checked it last week making me hesitant to aggressively diurese the patient lower than today given that he is asymptomatic.   At that time, my plan was: Patient appears euvolemic although he does have pitting edema in both legs from the mid shin to his ankle which appears  to be edema secondary to chronic venous insufficiency rather than due to congestive heart failure at this point.  I believe we need to maintain his dry weight rather than to pursue further aggressive diuresis.  Decrease Lasix to 80 mg twice daily.  Continue potassium at 40 mEq p.o. daily and recheck a CMP.  Patient has a history of diabetes.  He is overdue for microalbumin as well as a hemoglobin A1c which I will check while he is here today  07/18/17 Patient is here today to recheck his INR.  His last INR was 2.1 at the end of October.  He is checking it every 6 weeks.  Currently on 4 mg of Coumadin every day.  He takes this for atrial fibrillation given his elevated chads score and his recent history of arterial embolization to his fingertips given his previous history of mitral valve repair.  He is also taking Lasix 80 mg twice daily.  Medicine list says 120 mg twice daily but the patient is actually taking 80 mg twice daily.  His weight is slightly higher than his last visit but he is also wearing a coat and have a close due to the recent snowstorm.  Patient states his weight is relatively unchanged since his last visit in October.  He has +2 pitting edema in his right leg  and only trace pitting edema in his left leg.  This is a chronic problem for the patient.  He has some mild basilar crackles in his lung but actually appears relatively euvolemic given his previous history.  He denies any shortness of breath or dyspnea on exertion.  At that time, my plan was: 'Weight is relatively stable compared to his last office visit.  The slight increase in his weight is due to his winter close.  Therefore he is essentially euvolemic as he was at his last visit taking Lasix 80 mg twice daily.  He does have significant pitting edema in his right leg however this is a chronic finding.  His left leg has only trace pitting edema in his lungs are relatively clear.  He denies any orthopnea or dyspnea on exertion.  Therefore  I believe he is euvolemic and I will make no changes in his Lasix dose.  INR was checked today and was found to be 2.5 and therapeutic.  Continue Coumadin 4 mg a day and recheck INR in 6 weeks.  Continue Lasix 80 mg twice daily.  I will check a BMP while the patient is here to assess his renal function and potassium  08/08/16 Since the patient's last visit, he has gained roughly 9 pounds. He now has +2 pitting edema in his right leg and +1 pitting edema in his left leg which is more than his baseline. He has bibasilar crackles and increasing shortness of breath and dyspnea on exertion. He is now winded with minimal activity. He denies any chest pain. He denies any orthopnea. He denies any paroxysmal nocturnal dyspnea. He has been taking Lasix 80 mg twice daily and potassium chloride 40 mEq daily.  At that time, my plan was: Increase Lasix to 120 mg by mouth twice a day, continue potassium chloride 40 mEq daily, add spironolactone 25 mg daily. Recheck on Monday and obtain a BMP at that time. His potassium level in December was 3.6 on his current regimen  08/12/16 Patient slowly seems to be improving. His weight is down 3 pounds since when I saw him 4 days ago. He continues to have significant pitting edema in his right leg which is chronic and trace pitting edema in his left leg. On exam, he also has bibasilar crackles left slightly worse than right.  However the patient feels that his breathing is at his baseline. He denies any new dyspnea on exertion and he denies any chest pain. He recently developed a cold over the last 24 hours. Symptoms include head congestion and a nonproductive cough. He denies any orthopnea. Cough is more of a tickle in his throat. He has used some of his wife's Hycodan for the cough and it seemed to help and he would like a prescription of his own.  At that time, my plan was: Patient slowly seems to be diuresing. I would like to recheck him in one week. Meanwhile today make no changes  in his medication and recheck a BMP to monitor his renal function and potassium on elevated dose of Lasix as well as the new dose of spironolactone. Changes may need to be made in his potassium depending upon the lab results. I did give him a prescription for Hycodan but I cautioned him to use this sparingly and only as needed for an upper respiratory infection. Do not believe the cough is related to pulmonary edema. However if the patient develops worsening shortness of breath or fever, he is to call me  immediately. Otherwise recheck in one week  08/19/17 Wt Readings from Last 3 Encounters:  08/19/17 218 lb (98.9 kg)  08/12/17 226 lb (102.5 kg)  08/08/17 229 lb (103.9 kg)   Patient has lost 8 pounds since his last visit.  His breathing is noticeably improved.  The swelling in both legs is also much better.  His lungs are clear although he does have a chronic left basilar posterior crackle.  However he appears euvolemic today and adequately diuresed.  He denies any chest pain or shortness of breath.  His INR is therapeutic at 2.9 Past Medical History:  Diagnosis Date  . Atelectasis of right lung   . Chronic diastolic (congestive) heart failure ([place])    a. 10/2013 Echo: EF 55-60%, basal-mid anteroseptal and basal-mid inferoseptal HK.  . Chronic fatigue   . Chronic subdural hematoma ([place])    a. 12/2015 Head CT: chronic right holo-hemispheric SDH w/o midline shift.  . Coronary artery disease    a. 11/2014 NSTEMI: DESx 2 placed to LAD and RCA; b. 08/2015 Cath: LAD patent stent, LCX 31m, RCA patent stent, 64m.  . Diabetes mellitus, type II ([place])    a. HgA1c 6.8 in 03/2015  . ED (erectile dysfunction)   . Gout   . Hemangioma of liver 08/02/2015   12 mm enhancing lesion seen on CT - suspicious for benign hemangioma but not diagnostic - MRI recommended  . HLD (hyperlipidemia)   . Hypertensive heart disease   . Kidney stones   . Lower extremity edema    a. chronic  . OA (osteoarthritis)   .  Orthostatic hypotension   . PAF (paroxysmal atrial fibrillation) ([place])    a. 10/2015 s/p DCCV;  b. CHA2DS2VASc = 6--> on coumadin; c. 10/2015 Echo: sev dil LA.  Marland Kitchen Physical deconditioning   . Prostate cancer ([place])   . Pulmonary HTN ([place])   . Pulmonary hypertension ([place])    a. 10/2015 Echo: PASP 60mmHg.  Marland Kitchen Severe mitral regurgitation    a.  10/2015 s/p minimally invasive MV repair; b. 10/2015 Echo: mod MS, mean grad 40mmHg.  Marland Kitchen Spinal stenosis   . SVT (supraventricular tachycardia) ([facility])    a. recurrent, usually responsive to vagal manuevers  . Tricuspid regurgitation    a. 10/2015 Echo: mild to mod TR.   Past Surgical History:  Procedure Laterality Date  . BACK SURGERY    . Back surgery x 2    . CARDIAC CATHETERIZATION N/A 08/08/2015   Procedure: Right/Left Heart Cath and Coronary Angiography;  Surgeon: Sherren Mocha, MD;  Location: [facility];  Service: Cardiovascular;  Laterality: N/A;  . CARDIAC SURGERY     2 cardiac stents  . CARDIOVERSION N/A 10/20/2015   Procedure: CARDIOVERSION;  Surgeon: Pixie Casino, MD;  Location: [facility];  Service: Cardiovascular;  Laterality: N/A;  . CERVICAL SPINE SURGERY    . CHOLECYSTECTOMY N/A 07/08/2016   Procedure: LAPAROSCOPIC CHOLECYSTECTOMY WITH INTRAOPERATIVE CHOLANGIOGRAM;  Surgeon: Georganna Skeans, MD;  Location: [facility];  Service: General;  Laterality: N/A;  . EYE SURGERY    . INTRAOPERATIVE TRANSESOPHAGEAL ECHOCARDIOGRAM  10/05/2015   Procedure: INTRAOPERATIVE TRANSESOPHAGEAL ECHOCARDIOGRAM;  Surgeon: Rexene Alberts, MD;  Location: [facility] OR;  Service: Open Heart Surgery;;  . JOINT REPLACEMENT     shoulder  . LEFT HEART CATHETERIZATION WITH CORONARY ANGIOGRAM N/A 11/16/2014   Procedure: LEFT HEART CATHETERIZATION WITH CORONARY ANGIOGRAM;  Surgeon: Troy Sine, MD;  Location[facility] CATH LAB;  Service: Cardiovascular;  Laterality: N/A;  .  MITRAL VALVE REPAIR Right 10/04/2015   Procedure: MINIMALLY INVASIVE MITRAL VALVE REPAIR (MVR);  Surgeon:  Rexene Alberts, MD;  Location: [facility];  Service: Open Heart Surgery;  Laterality: Right;  . Rt rotator cuff repair    . TEE WITHOUT CARDIOVERSION N/A 06/22/2015   Procedure: TRANSESOPHAGEAL ECHOCARDIOGRAM (TEE);  Surgeon: Skeet Latch, MD;  Location: [facility];  Service: Cardiovascular;  Laterality: N/A;  . TEE WITHOUT CARDIOVERSION N/A 10/04/2015   Procedure: TRANSESOPHAGEAL ECHOCARDIOGRAM (TEE);  Surgeon: Rexene Alberts, MD;  Location: [facility];  Service: Open Heart Surgery;  Laterality: N/A;  . WOUND EXPLORATION N/A 10/05/2015   Procedure: Sternal Incision;  Surgeon: Rexene Alberts, MD;  Location: [facility];  Service: Open Heart Surgery;  Laterality: N/A;   Current Outpatient Medications on File Prior to Visit  Medication Sig Dispense Refill  . allopurinol (ZYLOPRIM) 100 MG tablet TAKE ONE (1) TABLET EACH DAY 90 tablet 3  . colchicine 0.6 MG tablet Take 1 tablet (0.6 mg total) by mouth daily. 30 tablet 5  . finasteride (PROSCAR) 5 MG tablet TAKE ONE (1) TABLET BY MOUTH EVERY DAY 30 tablet 5  . furosemide (LASIX) 80 MG tablet Take 1 tablet (80 mg total) by mouth 2 (two) times daily. (Patient taking differently: Take 120 mg by mouth 2 (two) times daily. ) 60 tablet 3  . HYDROcodone-homatropine (HYCODAN) 5-1.5 MG/5ML syrup Take 5 mLs by mouth every 8 (eight) hours as needed for cough. 120 mL 0  . magnesium oxide (MAG-OX) 400 MG tablet Take 1 tablet (400 mg total) by mouth daily. 30 tablet 0  . metoprolol tartrate (LOPRESSOR) 25 MG tablet TAKE ONE-HALF TABLET BY MOUTH TWICE DAILY 30 tablet 5  . OVER THE COUNTER MEDICATION [place]: Take 1 capsule by mouth once a day for immune health    . potassium chloride (KLOR-CON) 20 MEQ packet Take 40 mEq by mouth daily. 60 packet 0  . pravastatin (PRAVACHOL) 40 MG tablet TAKE ONE (1) TABLET EACH DAY AT 6 P.M. 30 tablet 9  . spironolactone (ALDACTONE) 25 MG tablet Take 1 tablet (25 mg total) by mouth daily. 30 tablet 2  . traMADol (ULTRAM) 50 MG tablet Take  1 tablet (50 mg total) by mouth every 6 (six) hours as needed for moderate pain. 30 tablet 0  . warfarin (COUMADIN) 4 MG tablet Take 1 tablet (4 mg total) by mouth daily. Do not take with any other warfarin unless Dr. Dennard Schaumann instructs. 30 tablet 3  . Wheat Dextrin (BENEFIBER) POWD Take by mouth See admin instructions. Mix one teaspoonful into 6-8 ounces of fluid/milk and drink once daily     No current facility-administered medications on file prior to visit.    No Known Allergies Social History   Socioeconomic History  . Marital status: Married    Spouse name: Not on file  . Number of children: 4  . Years of education: Not on file  . Highest education level: Not on file  Social Needs  . Financial resource strain: Not on file  . Food insecurity - worry: Not on file  . Food insecurity - inability: Not on file  . Transportation needs - medical: Not on file  . Transportation needs - non-medical: Not on file  Occupational History    Employer: RETIRED  Tobacco Use  . Smoking status: Former Smoker    Types: Cigarettes, Pipe, Landscape architect  . Smokeless tobacco: Former Systems developer    Types: Chew  . Tobacco comment: QUIT SMOKING  MANY YEARS  AGO"  Substance and Sexual Activity  . Alcohol use: No  . Drug use: No  . Sexual activity: Not on file  Other Topics Concern  . Not on file  Social History Narrative   ** Merged History Encounter **       Four adopted children.  Lives alone.       Review of Systems  All other systems reviewed and are negative.      Objective:   Physical Exam  Constitutional: He appears well-developed and well-nourished.  Cardiovascular: Normal rate.  Murmur heard. Pulmonary/Chest: Effort normal. He has no wheezes. He has no rales.  Abdominal: Soft. Bowel sounds are normal. He exhibits no distension. There is no tenderness. There is no rebound.  Musculoskeletal: He exhibits edema.  Vitals reviewed.  See HPI       Assessment & Plan:  PAF (paroxysmal atrial  fibrillation) ([place]) - Plan: PT with INR/Fingerstick  Chronic diastolic CHF (congestive heart failure) ([place]) - Plan: BASIC METABOLIC PANEL WITH GFR  Patient appears euvolemic today.  Continue Lasix 120 mg twice daily, potassium chloride 40 mEq p.o. daily, spironolactone 25 mg a day.  Recheck BMP today to monitor potassium and renal function.  If stable follow-up again in 1 month.  Continue current dose of warfarin as his INR is therapeutic

## 2017-08-20 LAB — BASIC METABOLIC PANEL WITH GFR
BUN / CREAT RATIO: 19 (calc) (ref 6–22)
BUN: 28 mg/dL — AB (ref 7–25)
CO2: 32 mmol/L (ref 20–32)
CREATININE: 1.46 mg/dL — AB (ref 0.70–1.11)
Calcium: 9.1 mg/dL (ref 8.6–10.3)
Chloride: 98 mmol/L (ref 98–110)
GFR, EST NON AFRICAN AMERICAN: 43 mL/min/{1.73_m2} — AB (ref >=60)
GFR, Est African American: 50 mL/min/{1.73_m2} — ABNORMAL LOW (ref >=60)
GLUCOSE: 137 mg/dL — AB (ref 65–99)
Potassium: 3.9 mmol/L (ref 3.5–5.3)
SODIUM: 139 mmol/L (ref 135–146)

## 2017-08-28 ENCOUNTER — Other Ambulatory Visit: Payer: Self-pay | Admitting: Pharmacist

## 2017-08-28 NOTE — Patient Outreach (Signed)
[facility]) Care Management  08/28/2017  [name] [birth year] [id number]  84 year old male referred to [facility] services for medication management by Bud.  Patient would like to know if any of his medications cause constipation.    PMHx includes, but not limited to, PAF, CHF, SDH, CAD s/p sents, diabetes, gout, HLD, OA, prostate cancer, pulmonary HTN, severe mitral regurgitation and mild to moderate tricuspid regurgitation.    Successful call placed to [name]. HIPAA identifiers verified. [name] reports that he was having trouble with constipation after his gallbladder removal last fall however his physician gave him a prescription for a stool softener and he no longer is having problems.  He does not wish to review his medications at this time.  He has no other medication concerns.   Plan: [place] will close case at this time.  I will alert [facility] RN.  I am happy to assist in the future as needed.   Ralene Bathe, PharmD, [place] [phone]

## 2017-08-29 ENCOUNTER — Ambulatory Visit: Payer: Medicare Other | Admitting: Family Medicine

## 2017-09-05 ENCOUNTER — Encounter: Payer: Self-pay | Admitting: Family Medicine

## 2017-09-05 ENCOUNTER — Ambulatory Visit (INDEPENDENT_AMBULATORY_CARE_PROVIDER_SITE_OTHER): Payer: Medicare Other | Admitting: Family Medicine

## 2017-09-05 VITALS — BP 98/60 | HR 56 | Temp 97.5°F | Resp 16 | Ht 67.0 in | Wt 204.0 lb

## 2017-09-05 DIAGNOSIS — M7989 Other specified soft tissue disorders: Secondary | ICD-10-CM

## 2017-09-05 DIAGNOSIS — I5032 Chronic diastolic (congestive) heart failure: Secondary | ICD-10-CM | POA: Diagnosis not present

## 2017-09-05 NOTE — Progress Notes (Signed)
Subjective:    Patient ID: [name], male    DOB: 1928/09/12, 84 y.o.   MRN: [id number]  HPI  05/29/17 Please see the previous office visit.   patient is currently taking Lasix 120 mg p.o. twice daily in addition to potassium chloride 40 mEq a day.  At his last visit, the patient weighed 224 pounds.  He has diuresed some and his weight is down to 218 pounds today although he still has +1-2 pitting edema in his right leg and +1 pitting edema in his left leg.  He also has faint bibasilar crackles and continues to endorse mild shortness of breath with exertion.  At that time, my plan was: Patient tends to appear fluid overloaded on exam.  Continue Lasix 120 mg p.o. twice daily in addition to potassium 40 mEq a day.  Check BMP to monitor potassium as well as renal function.  Recheck BNP as it was over 800 at his first visit.  Based on lab work, will determine the length of time to continue aggressive diuresis.  For now I will make no changes in his medication.  If renal function is worsening, we may have to proceed cautiously but for the time being I am planning on continuing the higher dose of Lasix through the weekend into next week.  06/02/17 Patient has lost an additional 3 pounds.  He states that his breathing is much better.  His dyspnea on exertion has improved.  The crackles and rales in his lower lungs have subsided.  His shortness of breath has improved.  He continues to have pitting edema in both legs right greater than left however a lot of this appears to be due to chronic venous insufficiency.  He is not wearing compression hose.  The edema in his legs does not bother him.  His renal function was deteriorating when I checked it last week making me hesitant to aggressively diurese the patient lower than today given that he is asymptomatic.   At that time, my plan was: Patient appears euvolemic although he does have pitting edema in both legs from the mid shin to his ankle which appears  to be edema secondary to chronic venous insufficiency rather than due to congestive heart failure at this point.  I believe we need to maintain his dry weight rather than to pursue further aggressive diuresis.  Decrease Lasix to 80 mg twice daily.  Continue potassium at 40 mEq p.o. daily and recheck a CMP.  Patient has a history of diabetes.  He is overdue for microalbumin as well as a hemoglobin A1c which I will check while he is here today  07/18/17 Patient is here today to recheck his INR.  His last INR was 2.1 at the end of October.  He is checking it every 6 weeks.  Currently on 4 mg of Coumadin every day.  He takes this for atrial fibrillation given his elevated chads score and his recent history of arterial embolization to his fingertips given his previous history of mitral valve repair.  He is also taking Lasix 80 mg twice daily.  Medicine list says 120 mg twice daily but the patient is actually taking 80 mg twice daily.  His weight is slightly higher than his last visit but he is also wearing a coat and have a close due to the recent snowstorm.  Patient states his weight is relatively unchanged since his last visit in October.  He has +2 pitting edema in his right leg  and only trace pitting edema in his left leg.  This is a chronic problem for the patient.  He has some mild basilar crackles in his lung but actually appears relatively euvolemic given his previous history.  He denies any shortness of breath or dyspnea on exertion.  At that time, my plan was: 'Weight is relatively stable compared to his last office visit.  The slight increase in his weight is due to his winter close.  Therefore he is essentially euvolemic as he was at his last visit taking Lasix 80 mg twice daily.  He does have significant pitting edema in his right leg however this is a chronic finding.  His left leg has only trace pitting edema in his lungs are relatively clear.  He denies any orthopnea or dyspnea on exertion.  Therefore  I believe he is euvolemic and I will make no changes in his Lasix dose.  INR was checked today and was found to be 2.5 and therapeutic.  Continue Coumadin 4 mg a day and recheck INR in 6 weeks.  Continue Lasix 80 mg twice daily.  I will check a BMP while the patient is here to assess his renal function and potassium  08/08/16 Since the patient's last visit, he has gained roughly 9 pounds. He now has +2 pitting edema in his right leg and +1 pitting edema in his left leg which is more than his baseline. He has bibasilar crackles and increasing shortness of breath and dyspnea on exertion. He is now winded with minimal activity. He denies any chest pain. He denies any orthopnea. He denies any paroxysmal nocturnal dyspnea. He has been taking Lasix 80 mg twice daily and potassium chloride 40 mEq daily.  At that time, my plan was: Increase Lasix to 120 mg by mouth twice a day, continue potassium chloride 40 mEq daily, add spironolactone 25 mg daily. Recheck on Monday and obtain a BMP at that time. His potassium level in December was 3.6 on his current regimen  08/12/16 Patient slowly seems to be improving. His weight is down 3 pounds since when I saw him 4 days ago. He continues to have significant pitting edema in his right leg which is chronic and trace pitting edema in his left leg. On exam, he also has bibasilar crackles left slightly worse than right.  However the patient feels that his breathing is at his baseline. He denies any new dyspnea on exertion and he denies any chest pain. He recently developed a cold over the last 24 hours. Symptoms include head congestion and a nonproductive cough. He denies any orthopnea. Cough is more of a tickle in his throat. He has used some of his wife's Hycodan for the cough and it seemed to help and he would like a prescription of his own.  At that time, my plan was: Patient slowly seems to be diuresing. I would like to recheck him in one week. Meanwhile today make no changes  in his medication and recheck a BMP to monitor his renal function and potassium on elevated dose of Lasix as well as the new dose of spironolactone. Changes may need to be made in his potassium depending upon the lab results. I did give him a prescription for Hycodan but I cautioned him to use this sparingly and only as needed for an upper respiratory infection. Do not believe the cough is related to pulmonary edema. However if the patient develops worsening shortness of breath or fever, he is to call me  immediately. Otherwise recheck in one week  08/19/17  Patient has lost 8 pounds since his last visit.  His breathing is noticeably improved.  The swelling in both legs is also much better.  His lungs are clear although he does have a chronic left basilar posterior crackle.  However he appears euvolemic today and adequately diuresed.  He denies any chest pain or shortness of breath.  His INR is therapeutic at 2.9.  At that time, my plan was: Patient appears euvolemic today.  Continue Lasix 120 mg twice daily, potassium chloride 40 mEq p.o. daily, spironolactone 25 mg a day.  Recheck BMP today to monitor potassium and renal function.  If stable follow-up again in 1 month.  Continue current dose of warfarin as his INR is therapeutic   09/05/17 Wt Readings from Last 3 Encounters:  09/05/17 204 lb (92.5 kg)  08/19/17 218 lb (98.9 kg)  08/12/17 226 lb (102.5 kg)  Patient has lost 12 additional lbs since last visit and also reports cramps.  After his last visit, there was a noticeable increase in his creatinine.  Therefore we changed his Lasix dose from 120 mg twice daily to 120 mg in the morning and 80 mg.  Despite that, the patient continues to lose weight.  There is now no edema in his legs.  His lungs are completely clear to auscultation.  Patient shows no evidence of fluid overload.  I am concerned about possible dehydration.  I believe 210 pounds is closer to his dry weight (204 on his scales).   Past  Medical History:  Diagnosis Date  . Atelectasis of right lung   . Chronic diastolic (congestive) heart failure ([place])    a. 10/2013 Echo: EF 55-60%, basal-mid anteroseptal and basal-mid inferoseptal HK.  . Chronic fatigue   . Chronic subdural hematoma ([place])    a. 12/2015 Head CT: chronic right holo-hemispheric SDH w/o midline shift.  . Coronary artery disease    a. 11/2014 NSTEMI: DESx 2 placed to LAD and RCA; b. 08/2015 Cath: LAD patent stent, LCX 6m, RCA patent stent, 89m.  . Diabetes mellitus, type II ([place])    a. HgA1c 6.8 in 03/2015  . ED (erectile dysfunction)   . Gout   . Hemangioma of liver 08/02/2015   12 mm enhancing lesion seen on CT - suspicious for benign hemangioma but not diagnostic - MRI recommended  . HLD (hyperlipidemia)   . Hypertensive heart disease   . Kidney stones   . Lower extremity edema    a. chronic  . OA (osteoarthritis)   . Orthostatic hypotension   . PAF (paroxysmal atrial fibrillation) ([place])    a. 10/2015 s/p DCCV;  b. CHA2DS2VASc = 6--> on coumadin; c. 10/2015 Echo: sev dil LA.  Marland Kitchen Physical deconditioning   . Prostate cancer ([place])   . Pulmonary HTN ([place])   . Pulmonary hypertension ([place])    a. 10/2015 Echo: PASP 68mmHg.  Marland Kitchen Severe mitral regurgitation    a.  10/2015 s/p minimally invasive MV repair; b. 10/2015 Echo: mod MS, mean grad 96mmHg.  Marland Kitchen Spinal stenosis   . SVT (supraventricular tachycardia) ([facility])    a. recurrent, usually responsive to vagal manuevers  . Tricuspid regurgitation    a. 10/2015 Echo: mild to mod TR.   Past Surgical History:  Procedure Laterality Date  . BACK SURGERY    . Back surgery x 2    . CARDIAC CATHETERIZATION N/A 08/08/2015   Procedure: Right/Left Heart Cath and Coronary Angiography;  Surgeon:  Sherren Mocha, MD;  Location: [facility];  Service: Cardiovascular;  Laterality: N/A;  . CARDIAC SURGERY     2 cardiac stents  . CARDIOVERSION N/A 10/20/2015   Procedure: CARDIOVERSION;  Surgeon: Pixie Casino, MD;  Location: [facility];  Service: Cardiovascular;  Laterality: N/A;  . CERVICAL SPINE SURGERY    . CHOLECYSTECTOMY N/A 07/08/2016   Procedure: LAPAROSCOPIC CHOLECYSTECTOMY WITH INTRAOPERATIVE CHOLANGIOGRAM;  Surgeon: Georganna Skeans, MD;  Location: [facility];  Service: General;  Laterality: N/A;  . EYE SURGERY    . INTRAOPERATIVE TRANSESOPHAGEAL ECHOCARDIOGRAM  10/05/2015   Procedure: INTRAOPERATIVE TRANSESOPHAGEAL ECHOCARDIOGRAM;  Surgeon: Rexene Alberts, MD;  Location: [facility] OR;  Service: Open Heart Surgery;;  . JOINT REPLACEMENT     shoulder  . LEFT HEART CATHETERIZATION WITH CORONARY ANGIOGRAM N/A 11/16/2014   Procedure: LEFT HEART CATHETERIZATION WITH CORONARY ANGIOGRAM;  Surgeon: Troy Sine, MD;  Location: [facility] CATH LAB;  Service: Cardiovascular;  Laterality: N/A;  . MITRAL VALVE REPAIR Right 10/04/2015   Procedure: MINIMALLY INVASIVE MITRAL VALVE REPAIR (MVR);  Surgeon: Rexene Alberts, MD;  Location: [place];  Service: Open Heart Surgery;  Laterality: Right;  . Rt rotator cuff repair    . TEE WITHOUT CARDIOVERSION N/A 06/22/2015   Procedure: TRANSESOPHAGEAL ECHOCARDIOGRAM (TEE);  Surgeon: Skeet Latch, MD;  Location: [place];  Service: Cardiovascular;  Laterality: N/A;  . TEE WITHOUT CARDIOVERSION N/A 10/04/2015   Procedure: TRANSESOPHAGEAL ECHOCARDIOGRAM (TEE);  Surgeon: Rexene Alberts, MD;  Location: [place];  Service: Open Heart Surgery;  Laterality: N/A;  . WOUND EXPLORATION N/A 10/05/2015   Procedure: Sternal Incision;  Surgeon: Rexene Alberts, MD;  Location: [place];  Service: Open Heart Surgery;  Laterality: N/A;   Current Outpatient Medications on File Prior to Visit  Medication Sig Dispense Refill  . allopurinol (ZYLOPRIM) 100 MG tablet TAKE ONE (1) TABLET EACH DAY 90 tablet 3  . colchicine 0.6 MG tablet Take 1 tablet (0.6 mg total) by mouth daily. 30 tablet 5  . finasteride (PROSCAR) 5 MG tablet TAKE ONE (1) TABLET BY MOUTH EVERY DAY 30 tablet 5  . furosemide (LASIX) 40 MG tablet Take 3 tablets  (120 mg total) by mouth 2 (two) times daily. (Patient taking differently: Take 120 mg by mouth 2 (two) times daily. 120 mg qam and 80 mg qpm) 180 tablet 3  . HYDROcodone-homatropine (HYCODAN) 5-1.5 MG/5ML syrup Take 5 mLs by mouth every 8 (eight) hours as needed for cough. 120 mL 0  . magnesium oxide (MAG-OX) 400 MG tablet Take 1 tablet (400 mg total) by mouth daily. 30 tablet 0  . metoprolol tartrate (LOPRESSOR) 25 MG tablet TAKE ONE-HALF TABLET BY MOUTH TWICE DAILY 30 tablet 5  . OVER THE COUNTER MEDICATION [place]: Take 1 capsule by mouth once a day for immune health    . potassium chloride (KLOR-CON) 20 MEQ packet Take 40 mEq by mouth daily. 60 packet 0  . pravastatin (PRAVACHOL) 40 MG tablet TAKE ONE (1) TABLET EACH DAY AT 6 P.M. 30 tablet 9  . spironolactone (ALDACTONE) 25 MG tablet Take 1 tablet (25 mg total) by mouth daily. 30 tablet 2  . traMADol (ULTRAM) 50 MG tablet Take 1 tablet (50 mg total) by mouth every 6 (six) hours as needed for moderate pain. 30 tablet 0  . warfarin (COUMADIN) 4 MG tablet Take 1 tablet (4 mg total) by mouth daily. Do not take with any other warfarin unless Dr. Dennard Schaumann instructs. 30 tablet 3  .  Wheat Dextrin (BENEFIBER) POWD Take by mouth See admin instructions. Mix one teaspoonful into 6-8 ounces of fluid/milk and drink once daily     No current facility-administered medications on file prior to visit.    No Known Allergies Social History   Socioeconomic History  . Marital status: Married    Spouse name: Not on file  . Number of children: 4  . Years of education: Not on file  . Highest education level: Not on file  Social Needs  . Financial resource strain: Not on file  . Food insecurity - worry: Not on file  . Food insecurity - inability: Not on file  . Transportation needs - medical: Not on file  . Transportation needs - non-medical: Not on file  Occupational History    Employer: RETIRED  Tobacco Use  . Smoking status: Former Smoker    Types:  Cigarettes, Pipe, Landscape architect  . Smokeless tobacco: Former Systems developer    Types: Chew  . Tobacco comment: QUIT SMOKING  MANY YEARS AGO"  Substance and Sexual Activity  . Alcohol use: No  . Drug use: No  . Sexual activity: Not on file  Other Topics Concern  . Not on file  Social History Narrative   ** Merged History Encounter **       Four adopted children.  Lives alone.       Review of Systems  All other systems reviewed and are negative.      Objective:   Physical Exam  Constitutional: He appears well-developed and well-nourished.  Cardiovascular: Normal rate.  Murmur heard. Pulmonary/Chest: Effort normal. He has no wheezes. He has no rales.  Abdominal: Soft. Bowel sounds are normal. He exhibits no distension. There is no tenderness. There is no rebound.  Musculoskeletal: He exhibits no edema.  Vitals reviewed.  See HPI       Assessment & Plan:  Leg swelling - Plan: BASIC METABOLIC PANEL WITH GFR  Chronic diastolic CHF (congestive heart failure) ([place])  Patient appears euvolemic or slightly dehydrated.  Recheck renal function.  Decrease Lasix to 80 mg twice daily.  Continue spironolactone 25 mg a day which seems to have augmented his diuresis substantially.  Continue potassium 40 mEq a day unless BMP dictates further changes.  Recheck weight in 1 week.  Recheck INR in 2 weeks.

## 2017-09-06 LAB — BASIC METABOLIC PANEL WITH GFR
BUN / CREAT RATIO: 22 (calc) (ref 6–22)
BUN: 34 mg/dL — ABNORMAL HIGH (ref 7–25)
CHLORIDE: 98 mmol/L (ref 98–110)
CO2: 27 mmol/L (ref 20–32)
CREATININE: 1.54 mg/dL — AB (ref 0.70–1.11)
Calcium: 9.3 mg/dL (ref 8.6–10.3)
GFR, Est African American: 47 mL/min/{1.73_m2} — ABNORMAL LOW (ref >=60)
GFR, Est Non African American: 40 mL/min/{1.73_m2} — ABNORMAL LOW (ref >=60)
GLUCOSE: 105 mg/dL — AB (ref 65–99)
POTASSIUM: 4.2 mmol/L (ref 3.5–5.3)
Sodium: 137 mmol/L (ref 135–146)

## 2017-09-12 ENCOUNTER — Telehealth: Payer: Self-pay | Admitting: Family Medicine

## 2017-09-12 NOTE — Telephone Encounter (Signed)
Pt's wife called to report wt for the pt. She states that his wt has been under 200 lbs all week until today and it has jumped up to 204 lbs.

## 2017-09-12 NOTE — Telephone Encounter (Signed)
Pt's wife aware via vm 

## 2017-09-12 NOTE — Telephone Encounter (Signed)
If weight continues to be elevated tomorrow take lasix 120 bid x 1 day

## 2017-09-15 ENCOUNTER — Other Ambulatory Visit: Payer: Self-pay | Admitting: *Deleted

## 2017-09-15 NOTE — Patient Outreach (Signed)
[facility]) Care Management  09/15/2017  [name] 1929-06-15 [id number]   RN Health Coach attempted #1 follow up outreach call to patient.  Patient was unavailable. HIPPA compliance voicemail message left with return callback number.  Plan: RN will call patient again within 10 business  days.  [place] Care Management [phone]

## 2017-09-16 ENCOUNTER — Ambulatory Visit: Payer: Self-pay | Admitting: *Deleted

## 2017-09-19 ENCOUNTER — Ambulatory Visit (INDEPENDENT_AMBULATORY_CARE_PROVIDER_SITE_OTHER): Payer: Medicare Other | Admitting: Family Medicine

## 2017-09-19 ENCOUNTER — Other Ambulatory Visit: Payer: Self-pay | Admitting: Family Medicine

## 2017-09-19 ENCOUNTER — Encounter: Payer: Self-pay | Admitting: Family Medicine

## 2017-09-19 VITALS — BP 118/70 | HR 60 | Temp 97.3°F | Resp 16 | Ht 67.0 in | Wt 208.0 lb

## 2017-09-19 DIAGNOSIS — I5032 Chronic diastolic (congestive) heart failure: Secondary | ICD-10-CM | POA: Diagnosis not present

## 2017-09-19 DIAGNOSIS — I480 Paroxysmal atrial fibrillation: Secondary | ICD-10-CM | POA: Diagnosis not present

## 2017-09-19 LAB — PT WITH INR/FINGERSTICK
INR, fingerstick: 3 ratio — ABNORMAL HIGH
PT, fingerstick: 35.4 s — ABNORMAL HIGH (ref 10.5–13.1)

## 2017-09-19 NOTE — Progress Notes (Signed)
Subjective:    Patient ID: NICHOLA Ivie Savitt, male    DOB: 10-03-1928, 84 y.o.   MRN: [id number]  HPI  05/29/17 Please see the previous office visit.   patient is currently taking Lasix 120 mg p.o. twice daily in addition to potassium chloride 40 mEq a day.  At his last visit, the patient weighed 224 pounds.  He has diuresed some and his weight is down to 218 pounds today although he still has +1-2 pitting edema in his right leg and +1 pitting edema in his left leg.  He also has faint bibasilar crackles and continues to endorse mild shortness of breath with exertion.  At that time, my plan was: Patient tends to appear fluid overloaded on exam.  Continue Lasix 120 mg p.o. twice daily in addition to potassium 40 mEq a day.  Check BMP to monitor potassium as well as renal function.  Recheck BNP as it was over 800 at his first visit.  Based on lab work, will determine the length of time to continue aggressive diuresis.  For now I will make no changes in his medication.  If renal function is worsening, we may have to proceed cautiously but for the time being I am planning on continuing the higher dose of Lasix through the weekend into next week.  06/02/17 Patient has lost an additional 3 pounds.  He states that his breathing is much better.  His dyspnea on exertion has improved.  The crackles and rales in his lower lungs have subsided.  His shortness of breath has improved.  He continues to have pitting edema in both legs right greater than left however a lot of this appears to be due to chronic venous insufficiency.  He is not wearing compression hose.  The edema in his legs does not bother him.  His renal function was deteriorating when I checked it last week making me hesitant to aggressively diurese the patient lower than today given that he is asymptomatic.   At that time, my plan was: Patient appears euvolemic although he does have pitting edema in both legs from the mid shin to his ankle which appears  to be edema secondary to chronic venous insufficiency rather than due to congestive heart failure at this point.  I believe we need to maintain his dry weight rather than to pursue further aggressive diuresis.  Decrease Lasix to 80 mg twice daily.  Continue potassium at 40 mEq p.o. daily and recheck a CMP.  Patient has a history of diabetes.  He is overdue for microalbumin as well as a hemoglobin A1c which I will check while he is here today  07/18/17 Patient is here today to recheck his INR.  His last INR was 2.1 at the end of October.  He is checking it every 6 weeks.  Currently on 4 mg of Coumadin every day.  He takes this for atrial fibrillation given his elevated chads score and his recent history of arterial embolization to his fingertips given his previous history of mitral valve repair.  He is also taking Lasix 80 mg twice daily.  Medicine list says 120 mg twice daily but the patient is actually taking 80 mg twice daily.  His weight is slightly higher than his last visit but he is also wearing a coat and have a close due to the recent snowstorm.  Patient states his weight is relatively unchanged since his last visit in October.  He has +2 pitting edema in his right leg  and only trace pitting edema in his left leg.  This is a chronic problem for the patient.  He has some mild basilar crackles in his lung but actually appears relatively euvolemic given his previous history.  He denies any shortness of breath or dyspnea on exertion.  At that time, my plan was: 'Weight is relatively stable compared to his last office visit.  The slight increase in his weight is due to his winter close.  Therefore he is essentially euvolemic as he was at his last visit taking Lasix 80 mg twice daily.  He does have significant pitting edema in his right leg however this is a chronic finding.  His left leg has only trace pitting edema in his lungs are relatively clear.  He denies any orthopnea or dyspnea on exertion.  Therefore  I believe he is euvolemic and I will make no changes in his Lasix dose.  INR was checked today and was found to be 2.5 and therapeutic.  Continue Coumadin 4 mg a day and recheck INR in 6 weeks.  Continue Lasix 80 mg twice daily.  I will check a BMP while the patient is here to assess his renal function and potassium  08/08/16 Since the patient's last visit, he has gained roughly 9 pounds. He now has +2 pitting edema in his right leg and +1 pitting edema in his left leg which is more than his baseline. He has bibasilar crackles and increasing shortness of breath and dyspnea on exertion. He is now winded with minimal activity. He denies any chest pain. He denies any orthopnea. He denies any paroxysmal nocturnal dyspnea. He has been taking Lasix 80 mg twice daily and potassium chloride 40 mEq daily.  At that time, my plan was: Increase Lasix to 120 mg by mouth twice a day, continue potassium chloride 40 mEq daily, add spironolactone 25 mg daily. Recheck on Monday and obtain a BMP at that time. His potassium level in December was 3.6 on his current regimen  08/12/16 Patient slowly seems to be improving. His weight is down 3 pounds since when I saw him 4 days ago. He continues to have significant pitting edema in his right leg which is chronic and trace pitting edema in his left leg. On exam, he also has bibasilar crackles left slightly worse than right.  However the patient feels that his breathing is at his baseline. He denies any new dyspnea on exertion and he denies any chest pain. He recently developed a cold over the last 24 hours. Symptoms include head congestion and a nonproductive cough. He denies any orthopnea. Cough is more of a tickle in his throat. He has used some of his wife's Hycodan for the cough and it seemed to help and he would like a prescription of his own.  At that time, my plan was: Patient slowly seems to be diuresing. I would like to recheck him in one week. Meanwhile today make no changes  in his medication and recheck a BMP to monitor his renal function and potassium on elevated dose of Lasix as well as the new dose of spironolactone. Changes may need to be made in his potassium depending upon the lab results. I did give him a prescription for Hycodan but I cautioned him to use this sparingly and only as needed for an upper respiratory infection. Do not believe the cough is related to pulmonary edema. However if the patient develops worsening shortness of breath or fever, he is to call me  immediately. Otherwise recheck in one week  08/19/17  Patient has lost 8 pounds since his last visit.  His breathing is noticeably improved.  The swelling in both legs is also much better.  His lungs are clear although he does have a chronic left basilar posterior crackle.  However he appears euvolemic today and adequately diuresed.  He denies any chest pain or shortness of breath.  His INR is therapeutic at 2.9.  At that time, my plan was: Patient appears euvolemic today.  Continue Lasix 120 mg twice daily, potassium chloride 40 mEq p.o. daily, spironolactone 25 mg a day.  Recheck BMP today to monitor potassium and renal function.  If stable follow-up again in 1 month.  Continue current dose of warfarin as his INR is therapeutic   09/05/17 Wt Readings from Last 3 Encounters:  09/19/17 208 lb (94.3 kg)  09/05/17 204 lb (92.5 kg)  08/19/17 218 lb (98.9 kg)  Patient has lost 12 additional lbs since last visit and also reports cramps.  After his last visit, there was a noticeable increase in his creatinine.  Therefore we changed his Lasix dose from 120 mg twice daily to 120 mg in the morning and 80 mg.  Despite that, the patient continues to lose weight.  There is now no edema in his legs.  His lungs are completely clear to auscultation.  Patient shows no evidence of fluid overload.  I am concerned about possible dehydration.  I believe 210 pounds is closer to his dry weight (204 on his scales).  At that  time, my plan was: Patient appears euvolemic or slightly dehydrated.  Recheck renal function.  Decrease Lasix to 80 mg twice daily.  Continue spironolactone 25 mg a day which seems to have augmented his diuresis substantially.  Continue potassium 40 mEq a day unless BMP dictates further changes.  Recheck weight in 1 week.  Recheck INR in 2 weeks.  09/19/17 His weight on his home scales has been averaging around 200-201 pounds.  Today on my scales he is 208 pounds.  According to my scales around 210 is his dry weight.  Therefore he is stable with regards to his weight.  He appears euvolemic with no evidence of dehydration.  He denies any dizziness or syncope or near syncope.  His INR today in the office is 3.0 and therapeutic Past Medical History:  Diagnosis Date  . Atelectasis of right lung   . Chronic diastolic (congestive) heart failure ([place])    a. 10/2013 Echo: EF 55-60%, basal-mid anteroseptal and basal-mid inferoseptal HK.  . Chronic fatigue   . Chronic subdural hematoma ([facility])    a. 12/2015 Head CT: chronic right holo-hemispheric SDH w/o midline shift.  . Coronary artery disease    a. 11/2014 NSTEMI: DESx 2 placed to LAD and RCA; b. 08/2015 Cath: LAD patent stent, LCX 29m, RCA patent stent, 59m.  . Diabetes mellitus, type II ([place])    a. HgA1c 6.8 in 03/2015  . ED (erectile dysfunction)   . Gout   . Hemangioma of liver 08/02/2015   12 mm enhancing lesion seen on CT - suspicious for benign hemangioma but not diagnostic - MRI recommended  . HLD (hyperlipidemia)   . Hypertensive heart disease   . Kidney stones   . Lower extremity edema    a. chronic  . OA (osteoarthritis)   . Orthostatic hypotension   . PAF (paroxysmal atrial fibrillation) ([place])    a. 10/2015 s/p DCCV;  b. CHA2DS2VASc = 6--> on  coumadin; c. 10/2015 Echo: sev dil LA.  Marland Kitchen Physical deconditioning   . Prostate cancer ([place])   . Pulmonary HTN ([place])   . Pulmonary hypertension ([place])    a. 10/2015 Echo: PASP 3mmHg.  Marland Kitchen Severe mitral  regurgitation    a.  10/2015 s/p minimally invasive MV repair; b. 10/2015 Echo: mod MS, mean grad 68mmHg.  Marland Kitchen Spinal stenosis   . SVT (supraventricular tachycardia) ([facility])    a. recurrent, usually responsive to vagal manuevers  . Tricuspid regurgitation    a. 10/2015 Echo: mild to mod TR.   Past Surgical History:  Procedure Laterality Date  . BACK SURGERY    . Back surgery x 2    . CARDIAC CATHETERIZATION N/A 08/08/2015   Procedure: Right/Left Heart Cath and Coronary Angiography;  Surgeon: Sherren Mocha, MD;  Location: [facility];  Service: Cardiovascular;  Laterality: N/A;  . CARDIAC SURGERY     2 cardiac stents  . CARDIOVERSION N/A 10/20/2015   Procedure: CARDIOVERSION;  Surgeon: Pixie Casino, MD;  Location: [facility];  Service: Cardiovascular;  Laterality: N/A;  . CERVICAL SPINE SURGERY    . CHOLECYSTECTOMY N/A 07/08/2016   Procedure: LAPAROSCOPIC CHOLECYSTECTOMY WITH INTRAOPERATIVE CHOLANGIOGRAM;  Surgeon: Georganna Skeans, MD;  Location: [facility];  Service: General;  Laterality: N/A;  . EYE SURGERY    . INTRAOPERATIVE TRANSESOPHAGEAL ECHOCARDIOGRAM  10/05/2015   Procedure: INTRAOPERATIVE TRANSESOPHAGEAL ECHOCARDIOGRAM;  Surgeon: Rexene Alberts, MD;  Location: [facility];  Service: Open Heart Surgery;;  . JOINT REPLACEMENT     shoulder  . LEFT HEART CATHETERIZATION WITH CORONARY ANGIOGRAM N/A 11/16/2014   Procedure: LEFT HEART CATHETERIZATION WITH CORONARY ANGIOGRAM;  Surgeon: Troy Sine, MD;  Location: [facility] C[facility];  Service: Cardiovascular;  Laterality: N/A;  . MITRAL VALVE REPAIR Right 10/04/2015   Procedure: MINIMALLY INVASIVE MITRAL VALVE REPAIR (MVR);  Surgeon: Rexene Alberts, MD;  Location: [facility];  Service: Open Heart Surgery;  Laterality: Right;  . Rt rotator cuff repair    . TEE WITHOUT CARDIOVERSION N/A 06/22/2015   Procedure: TRANSESOPHAGEAL ECHOCARDIOGRAM (TEE);  Surgeon: Skeet Latch, MD;  Location: [facility];  Service: Cardiovascular;  Laterality: N/A;  . TEE  WITHOUT CARDIOVERSION N/A 10/04/2015   Procedure: TRANSESOPHAGEAL ECHOCARDIOGRAM (TEE);  Surgeon: Rexene Alberts, MD;  Location: [facility] [facility];  Service: Open Heart Surgery;  Laterality: N/A;  . WOUND EXPLORATION N/A 10/05/2015   Procedure: Sternal Incision;  Surgeon: Rexene Alberts, MD;  Location: [facility];  Service: Open Heart Surgery;  Laterality: N/A;   Current Outpatient Medications on File Prior to Visit  Medication Sig Dispense Refill  . allopurinol (ZYLOPRIM) 100 MG tablet TAKE ONE (1) TABLET EACH DAY 90 tablet 3  . colchicine 0.6 MG tablet Take 1 tablet (0.6 mg total) by mouth daily. 30 tablet 5  . finasteride (PROSCAR) 5 MG tablet TAKE ONE (1) TABLET BY MOUTH EVERY DAY 30 tablet 5  . furosemide (LASIX) 40 MG tablet Take 80 mg by mouth 2 (two) times daily.    Marland Kitchen HYDROcodone-homatropine (HYCODAN) 5-1.5 MG/5ML syrup Take 5 mLs by mouth every 8 (eight) hours as needed for cough. 120 mL 0  . magnesium oxide (MAG-OX) 400 MG tablet Take 1 tablet (400 mg total) by mouth daily. 30 tablet 0  . metoprolol tartrate (LOPRESSOR) 25 MG tablet TAKE ONE-HALF TABLET BY MOUTH TWICE DAILY 30 tablet 5  . OVER THE COUNTER MEDICATION [place]: Take 1 capsule by mouth once a day for immune health    .  potassium chloride (KLOR-CON) 20 MEQ packet Take 40 mEq by mouth daily. 60 packet 0  . pravastatin (PRAVACHOL) 40 MG tablet TAKE ONE (1) TABLET EACH DAY AT 6 P.M. 30 tablet 9  . spironolactone (ALDACTONE) 25 MG tablet Take 1 tablet (25 mg total) by mouth daily. 30 tablet 2  . traMADol (ULTRAM) 50 MG tablet Take 1 tablet (50 mg total) by mouth every 6 (six) hours as needed for moderate pain. 30 tablet 0  . warfarin (COUMADIN) 4 MG tablet Take 1 tablet (4 mg total) by mouth daily. Do not take with any other warfarin unless Dr. Dennard Schaumann instructs. 30 tablet 3  . Wheat Dextrin (BENEFIBER) POWD Take by mouth See admin instructions. Mix one teaspoonful into 6-8 ounces of fluid/milk and drink once daily     No current  facility-administered medications on file prior to visit.    No Known Allergies Social History   Socioeconomic History  . Marital status: Married    Spouse name: Not on file  . Number of children: 4  . Years of education: Not on file  . Highest education level: Not on file  Social Needs  . Financial resource strain: Not on file  . Food insecurity - worry: Not on file  . Food insecurity - inability: Not on file  . Transportation needs - medical: Not on file  . Transportation needs - non-medical: Not on file  Occupational History    Employer: RETIRED  Tobacco Use  . Smoking status: Former Smoker    Types: Cigarettes, Pipe, Landscape architect  . Smokeless tobacco: Former Systems developer    Types: Chew  . Tobacco comment: QUIT SMOKING  MANY YEARS AGO"  Substance and Sexual Activity  . Alcohol use: No  . Drug use: No  . Sexual activity: Not on file  Other Topics Concern  . Not on file  Social History Narrative   ** Merged History Encounter **       Four adopted children.  Lives alone.       Review of Systems  All other systems reviewed and are negative.      Objective:   Physical Exam  Constitutional: He appears well-developed and well-nourished.  Cardiovascular: Normal rate.  Murmur heard. Pulmonary/Chest: Effort normal. He has no wheezes. He has no rales.  Abdominal: Soft. Bowel sounds are normal. He exhibits no distension. There is no tenderness. There is no rebound.  Musculoskeletal: He exhibits no edema.  Vitals reviewed.  See HPI       Assessment & Plan:  Chronic diastolic CHF (congestive heart failure) ([place]) - Plan: BASIC METABOLIC PANEL WITH GFR, PT with INR/Fingerstick  PAF (paroxysmal atrial fibrillation) ([place]) - Plan: BASIC METABOLIC PANEL WITH GFR, PT with INR/Fingerstick  Patient appears euvolemic.  I believe we have hit the perfect dose for him/combination of medication.  Continue Lasix 80 mg twice daily and spironolactone 25 mg daily.  Repeat BMP today.  If renal  function is stable, I would maintain this dose indefinitely unless we see rapid fluctuations in his weight.  His INR is therapeutic however I have asked him to take just a half of a Coumadin tablet tomorrow.  This will be a one-time dose correction to allow his blood to thicken slightly.  Then resume 1 pill a day thereafter.  Recheck in 6 weeks

## 2017-09-20 LAB — BASIC METABOLIC PANEL WITH GFR
BUN / CREAT RATIO: 20 (calc) (ref 6–22)
BUN: 34 mg/dL — ABNORMAL HIGH (ref 7–25)
CHLORIDE: 99 mmol/L (ref 98–110)
CO2: 25 mmol/L (ref 20–32)
Calcium: 9.2 mg/dL (ref 8.6–10.3)
Creat: 1.71 mg/dL — ABNORMAL HIGH (ref 0.70–1.11)
GFR, EST AFRICAN AMERICAN: 41 mL/min/{1.73_m2} — AB (ref >=60)
GFR, Est Non African American: 35 mL/min/{1.73_m2} — ABNORMAL LOW (ref >=60)
Glucose, Bld: 114 mg/dL — ABNORMAL HIGH (ref 65–99)
POTASSIUM: 4.2 mmol/L (ref 3.5–5.3)
SODIUM: 135 mmol/L (ref 135–146)

## 2017-09-22 ENCOUNTER — Other Ambulatory Visit: Payer: Self-pay | Admitting: Family Medicine

## 2017-09-22 DIAGNOSIS — I5032 Chronic diastolic (congestive) heart failure: Secondary | ICD-10-CM

## 2017-09-29 ENCOUNTER — Other Ambulatory Visit: Payer: Medicare Other

## 2017-09-29 DIAGNOSIS — I5032 Chronic diastolic (congestive) heart failure: Secondary | ICD-10-CM

## 2017-09-30 LAB — BASIC METABOLIC PANEL
BUN / CREAT RATIO: 18 (calc) (ref 6–22)
BUN: 27 mg/dL — ABNORMAL HIGH (ref 7–25)
CALCIUM: 9.1 mg/dL (ref 8.6–10.3)
CHLORIDE: 98 mmol/L (ref 98–110)
CO2: 30 mmol/L (ref 20–32)
Creat: 1.54 mg/dL — ABNORMAL HIGH (ref 0.70–1.11)
GLUCOSE: 115 mg/dL — AB (ref 65–99)
POTASSIUM: 4 mmol/L (ref 3.5–5.3)
SODIUM: 137 mmol/L (ref 135–146)

## 2017-10-14 ENCOUNTER — Encounter: Payer: Self-pay | Admitting: Family Medicine

## 2017-10-14 ENCOUNTER — Ambulatory Visit (INDEPENDENT_AMBULATORY_CARE_PROVIDER_SITE_OTHER): Payer: Medicare Other | Admitting: Family Medicine

## 2017-10-14 VITALS — BP 100/62 | HR 58 | Temp 97.4°F | Resp 18 | Ht 67.0 in | Wt 218.0 lb

## 2017-10-14 DIAGNOSIS — I480 Paroxysmal atrial fibrillation: Secondary | ICD-10-CM

## 2017-10-14 DIAGNOSIS — I5032 Chronic diastolic (congestive) heart failure: Secondary | ICD-10-CM | POA: Diagnosis not present

## 2017-10-14 LAB — PT WITH INR/FINGERSTICK
INR FINGERSTICK: 2.7 ratio — AB
PT, fingerstick: 32.1 s — ABNORMAL HIGH (ref 10.5–13.1)

## 2017-10-14 NOTE — Progress Notes (Signed)
Subjective:    Patient ID: [name], male    DOB: 1929/07/07, 84 y.o.   MRN: [id number]  HPI  05/29/17 Please see the previous office visit.   patient is currently taking Lasix 120 mg p.o. twice daily in addition to potassium chloride 40 mEq a day.  At his last visit, the patient weighed 224 pounds.  He has diuresed some and his weight is down to 218 pounds today although he still has +1-2 pitting edema in his right leg and +1 pitting edema in his left leg.  He also has faint bibasilar crackles and continues to endorse mild shortness of breath with exertion.  At that time, my plan was: Patient tends to appear fluid overloaded on exam.  Continue Lasix 120 mg p.o. twice daily in addition to potassium 40 mEq a day.  Check BMP to monitor potassium as well as renal function.  Recheck BNP as it was over 800 at his first visit.  Based on lab work, will determine the length of time to continue aggressive diuresis.  For now I will make no changes in his medication.  If renal function is worsening, we may have to proceed cautiously but for the time being I am planning on continuing the higher dose of Lasix through the weekend into next week.  06/02/17 Patient has lost an additional 3 pounds.  He states that his breathing is much better.  His dyspnea on exertion has improved.  The crackles and rales in his lower lungs have subsided.  His shortness of breath has improved.  He continues to have pitting edema in both legs right greater than left however a lot of this appears to be due to chronic venous insufficiency.  He is not wearing compression hose.  The edema in his legs does not bother him.  His renal function was deteriorating when I checked it last week making me hesitant to aggressively diurese the patient lower than today given that he is asymptomatic.   At that time, my plan was: Patient appears euvolemic although he does have pitting edema in both legs from the mid shin to his ankle which appears  to be edema secondary to chronic venous insufficiency rather than due to congestive heart failure at this point.  I believe we need to maintain his dry weight rather than to pursue further aggressive diuresis.  Decrease Lasix to 80 mg twice daily.  Continue potassium at 40 mEq p.o. daily and recheck a CMP.  Patient has a history of diabetes.  He is overdue for microalbumin as well as a hemoglobin A1c which I will check while he is here today  07/18/17 Patient is here today to recheck his INR.  His last INR was 2.1 at the end of October.  He is checking it every 6 weeks.  Currently on 4 mg of Coumadin every day.  He takes this for atrial fibrillation given his elevated chads score and his recent history of arterial embolization to his fingertips given his previous history of mitral valve repair.  He is also taking Lasix 80 mg twice daily.  Medicine list says 120 mg twice daily but the patient is actually taking 80 mg twice daily.  His weight is slightly higher than his last visit but he is also wearing a coat and have a close due to the recent snowstorm.  Patient states his weight is relatively unchanged since his last visit in October.  He has +2 pitting edema in his right leg  and only trace pitting edema in his left leg.  This is a chronic problem for the patient.  He has some mild basilar crackles in his lung but actually appears relatively euvolemic given his previous history.  He denies any shortness of breath or dyspnea on exertion.  At that time, my plan was: 'Weight is relatively stable compared to his last office visit.  The slight increase in his weight is due to his winter close.  Therefore he is essentially euvolemic as he was at his last visit taking Lasix 80 mg twice daily.  He does have significant pitting edema in his right leg however this is a chronic finding.  His left leg has only trace pitting edema in his lungs are relatively clear.  He denies any orthopnea or dyspnea on exertion.  Therefore  I believe he is euvolemic and I will make no changes in his Lasix dose.  INR was checked today and was found to be 2.5 and therapeutic.  Continue Coumadin 4 mg a day and recheck INR in 6 weeks.  Continue Lasix 80 mg twice daily.  I will check a BMP while the patient is here to assess his renal function and potassium  08/08/16 Since the patient's last visit, he has gained roughly 9 pounds. He now has +2 pitting edema in his right leg and +1 pitting edema in his left leg which is more than his baseline. He has bibasilar crackles and increasing shortness of breath and dyspnea on exertion. He is now winded with minimal activity. He denies any chest pain. He denies any orthopnea. He denies any paroxysmal nocturnal dyspnea. He has been taking Lasix 80 mg twice daily and potassium chloride 40 mEq daily.  At that time, my plan was: Increase Lasix to 120 mg by mouth twice a day, continue potassium chloride 40 mEq daily, add spironolactone 25 mg daily. Recheck on Monday and obtain a BMP at that time. His potassium level in December was 3.6 on his current regimen  08/12/16 Patient slowly seems to be improving. His weight is down 3 pounds since when I saw him 4 days ago. He continues to have significant pitting edema in his right leg which is chronic and trace pitting edema in his left leg. On exam, he also has bibasilar crackles left slightly worse than right.  However the patient feels that his breathing is at his baseline. He denies any new dyspnea on exertion and he denies any chest pain. He recently developed a cold over the last 24 hours. Symptoms include head congestion and a nonproductive cough. He denies any orthopnea. Cough is more of a tickle in his throat. He has used some of his wife's Hycodan for the cough and it seemed to help and he would like a prescription of his own.  At that time, my plan was: Patient slowly seems to be diuresing. I would like to recheck him in one week. Meanwhile today make no changes  in his medication and recheck a BMP to monitor his renal function and potassium on elevated dose of Lasix as well as the new dose of spironolactone. Changes may need to be made in his potassium depending upon the lab results. I did give him a prescription for Hycodan but I cautioned him to use this sparingly and only as needed for an upper respiratory infection. Do not believe the cough is related to pulmonary edema. However if the patient develops worsening shortness of breath or fever, he is to call me  immediately. Otherwise recheck in one week  08/19/17  Patient has lost 8 pounds since his last visit.  His breathing is noticeably improved.  The swelling in both legs is also much better.  His lungs are clear although he does have a chronic left basilar posterior crackle.  However he appears euvolemic today and adequately diuresed.  He denies any chest pain or shortness of breath.  His INR is therapeutic at 2.9.  At that time, my plan was: Patient appears euvolemic today.  Continue Lasix 120 mg twice daily, potassium chloride 40 mEq p.o. daily, spironolactone 25 mg a day.  Recheck BMP today to monitor potassium and renal function.  If stable follow-up again in 1 month.  Continue current dose of warfarin as his INR is therapeutic   09/05/17 Wt Readings from Last 3 Encounters:  10/14/17 218 lb (98.9 kg)  09/19/17 208 lb (94.3 kg)  09/05/17 204 lb (92.5 kg)  Patient has lost 12 additional lbs since last visit and also reports cramps.  After his last visit, there was a noticeable increase in his creatinine.  Therefore we changed his Lasix dose from 120 mg twice daily to 120 mg in the morning and 80 mg.  Despite that, the patient continues to lose weight.  There is now no edema in his legs.  His lungs are completely clear to auscultation.  Patient shows no evidence of fluid overload.  I am concerned about possible dehydration.  I believe 210 pounds is closer to his dry weight (204 on his scales).  At that  time, my plan was: Patient appears euvolemic or slightly dehydrated.  Recheck renal function.  Decrease Lasix to 80 mg twice daily.  Continue spironolactone 25 mg a day which seems to have augmented his diuresis substantially.  Continue potassium 40 mEq a day unless BMP dictates further changes.  Recheck weight in 1 week.  Recheck INR in 2 weeks.  09/19/17 His weight on his home scales has been averaging around 200-201 pounds.  Today on my scales he is 208 pounds.  According to my scales around 210 is his dry weight.  Therefore he is stable with regards to his weight.  He appears euvolemic with no evidence of dehydration.  He denies any dizziness or syncope or near syncope.  His INR today in the office is 3.0 and therapeutic.  At that time, my plan was: Patient appears euvolemic.  I believe we have hit the perfect dose for him/combination of medication.  Continue Lasix 80 mg twice daily and spironolactone 25 mg daily.  Repeat BMP today.  If renal function is stable, I would maintain this dose indefinitely unless we see rapid fluctuations in his weight.  His INR is therapeutic however I have asked him to take just a half of a Coumadin tablet tomorrow.  This will be a one-time dose correction to allow his blood to thicken slightly.  Then resume 1 pill a day thereafter.  Recheck in 6 weeks  10/14/17 On his labs 2/15, CR was up to 1.7 and spironolactone was held.  Repeat bp 1 week later was improved and spironolactone was continued to be held.  Patient is currently on lasix 80 mg po bid only.  Since spironolactone was held, weight has increased from 208 lbs to 218 lbs.  He is experiencing recurrent swelling in his ankles and legs.  Patient has +1 pitting in both legs to the knees  He also has bibasilar crackles again.  He denies sob, doe, orthopnea.  Past Medical History:  Diagnosis Date  . Atelectasis of right lung   . Chronic diastolic (congestive) heart failure ([place])    a. 10/2013 Echo: EF 55-60%, basal-mid  anteroseptal and basal-mid inferoseptal HK.  . Chronic fatigue   . Chronic subdural hematoma ([place])    a. 12/2015 Head CT: chronic right holo-hemispheric SDH w/o midline shift.  . Coronary artery disease    a. 11/2014 NSTEMI: DESx 2 placed to LAD and RCA; b. 08/2015 Cath: LAD patent stent, LCX 73m, RCA patent stent, 42m.  . Diabetes mellitus, type II ([place])    a. HgA1c 6.8 in 03/2015  . ED (erectile dysfunction)   . Gout   . Hemangioma of liver 08/02/2015   12 mm enhancing lesion seen on CT - suspicious for benign hemangioma but not diagnostic - MRI recommended  . HLD (hyperlipidemia)   . Hypertensive heart disease   . Kidney stones   . Lower extremity edema    a. chronic  . OA (osteoarthritis)   . Orthostatic hypotension   . PAF (paroxysmal atrial fibrillation) ([place])    a. 10/2015 s/p DCCV;  b. CHA2DS2VASc = 6--> on coumadin; c. 10/2015 Echo: sev dil LA.  Marland Kitchen Physical deconditioning   . Prostate cancer ([place])   . Pulmonary HTN ([place])   . Pulmonary hypertension ([place])    a. 10/2015 Echo: PASP 51mmHg.  Marland Kitchen Severe mitral regurgitation    a.  10/2015 s/p minimally invasive MV repair; b. 10/2015 Echo: mod MS, mean grad 67mmHg.  Marland Kitchen Spinal stenosis   . SVT (supraventricular tachycardia) ([facility])    a. recurrent, usually responsive to vagal manuevers  . Tricuspid regurgitation    a. 10/2015 Echo: mild to mod TR.   Past Surgical History:  Procedure Laterality Date  . BACK SURGERY    . Back surgery x 2    . CARDIAC CATHETERIZATION N/A 08/08/2015   Procedure: Right/Left Heart Cath and Coronary Angiography;  Surgeon: Sherren Mocha, MD;  Location: [facility];  Service: Cardiovascular;  Laterality: N/A;  . CARDIAC SURGERY     2 cardiac stents  . CARDIOVERSION N/A 10/20/2015   Procedure: CARDIOVERSION;  Surgeon: Pixie Casino, MD;  Location: [facility];  Service: Cardiovascular;  Laterality: N/A;  . CERVICAL SPINE SURGERY    . CHOLECYSTECTOMY N/A 07/08/2016   Procedure: LAPAROSCOPIC CHOLECYSTECTOMY  WITH INTRAOPERATIVE CHOLANGIOGRAM;  Surgeon: Georganna Skeans, MD;  Location: [facility];  Service: General;  Laterality: N/A;  . EYE SURGERY    . INTRAOPERATIVE TRANSESOPHAGEAL ECHOCARDIOGRAM  10/05/2015   Procedure: INTRAOPERATIVE TRANSESOPHAGEAL ECHOCARDIOGRAM;  Surgeon: Rexene Alberts, MD;  Location: [facility] OR;  Service: Open Heart Surgery;;  . JOINT REPLACEMENT     shoulder  . LEFT HEART CATHETERIZATION WITH CORONARY ANGIOGRAM N/A 11/16/2014   Procedure: LEFT HEART CATHETERIZATION WITH CORONARY ANGIOGRAM;  Surgeon: Troy Sine, MD;  Location: [facility] C[facility];  Service: Cardiovascular;  Laterality: N/A;  . MITRAL VALVE REPAIR Right 10/04/2015   Procedure: MINIMALLY INVASIVE MITRAL VALVE REPAIR (MVR);  Surgeon: Rexene Alberts, MD;  Location: [facility];  Service: Open Heart Surgery;  Laterality: Right;  . Rt rotator cuff repair    . TEE WITHOUT CARDIOVERSION N/A 06/22/2015   Procedure: TRANSESOPHAGEAL ECHOCARDIOGRAM (TEE);  Surgeon: Skeet Latch, MD;  Location: [facility];  Service: Cardiovascular;  Laterality: N/A;  . TEE WITHOUT CARDIOVERSION N/A 10/04/2015   Procedure: TRANSESOPHAGEAL ECHOCARDIOGRAM (TEE);  Surgeon: Rexene Alberts, MD;  Location: [facility];  Service: Open Heart Surgery;  Laterality:  N/A;  . WOUND EXPLORATION N/A 10/05/2015   Procedure: Sternal Incision;  Surgeon: Rexene Alberts, MD;  Location: [facility];  Service: Open Heart Surgery;  Laterality: N/A;   Current Outpatient Medications on File Prior to Visit  Medication Sig Dispense Refill  . allopurinol (ZYLOPRIM) 100 MG tablet TAKE ONE (1) TABLET EACH DAY 90 tablet 3  . colchicine 0.6 MG tablet Take 1 tablet (0.6 mg total) by mouth daily. 30 tablet 5  . finasteride (PROSCAR) 5 MG tablet TAKE ONE (1) TABLET BY MOUTH EVERY DAY 30 tablet 5  . furosemide (LASIX) 40 MG tablet Take 80 mg by mouth 2 (two) times daily.    . magnesium oxide (MAG-OX) 400 MG tablet Take 1 tablet (400 mg total) by mouth daily. 30 tablet 0  . metoprolol tartrate  (LOPRESSOR) 25 MG tablet TAKE ONE-HALF TABLET BY MOUTH TWICE DAILY 90 tablet 3  . OVER THE COUNTER MEDICATION Neuroquell: Take 1 capsule by mouth once a day for immune health    . potassium chloride (KLOR-CON) 20 MEQ packet Take 40 mEq by mouth daily. 60 packet 0  . pravastatin (PRAVACHOL) 40 MG tablet TAKE ONE (1) TABLET EACH DAY AT 6 P.M. 30 tablet 9  . spironolactone (ALDACTONE) 25 MG tablet Take 1 tablet (25 mg total) by mouth daily. 30 tablet 2  . traMADol (ULTRAM) 50 MG tablet Take 1 tablet (50 mg total) by mouth every 6 (six) hours as needed for moderate pain. 30 tablet 0  . warfarin (COUMADIN) 4 MG tablet Take 1 tablet (4 mg total) by mouth daily. Do not take with any other warfarin unless Dr. Dennard Schaumann instructs. 30 tablet 3  . Wheat Dextrin (BENEFIBER) POWD Take by mouth See admin instructions. Mix one teaspoonful into 6-8 ounces of fluid/milk and drink once daily     No current facility-administered medications on file prior to visit.    No Known Allergies Social History   Socioeconomic History  . Marital status: Married    Spouse name: Not on file  . Number of children: 4  . Years of education: Not on file  . Highest education level: Not on file  Social Needs  . Financial resource strain: Not on file  . Food insecurity - worry: Not on file  . Food insecurity - inability: Not on file  . Transportation needs - medical: Not on file  . Transportation needs - non-medical: Not on file  Occupational History    Employer: RETIRED  Tobacco Use  . Smoking status: Former Smoker    Types: Cigarettes, Pipe, Landscape architect  . Smokeless tobacco: Former Systems developer    Types: Chew  . Tobacco comment: QUIT SMOKING  MANY YEARS AGO"  Substance and Sexual Activity  . Alcohol use: No  . Drug use: No  . Sexual activity: Not on file  Other Topics Concern  . Not on file  Social History Narrative   ** Merged History Encounter **       Four adopted children.  Lives alone.       Review of Systems  All  other systems reviewed and are negative.      Objective:   Physical Exam  Constitutional: He appears well-developed and well-nourished.  Cardiovascular: Normal rate.  Murmur heard. Pulmonary/Chest: Effort normal. He has no wheezes. He has rhonchi.  Abdominal: Soft. Bowel sounds are normal. He exhibits no distension. There is no tenderness. There is no rebound.  Musculoskeletal: He exhibits edema.  Vitals reviewed.  See HPI  Assessment & Plan:  Chronic diastolic CHF (congestive heart failure) (HCC) - Plan: Basic Metabolic Panel  PAF (paroxysmal atrial fibrillation) (H[facility]) - Plan: PT with INR/Fingerstick, Basic Metabolic Panel  Continue Lasix 80 mg p.o. twice daily.  Resume Spironolactone 25 mg p.o. every morning.  Obtain baseline BMP today.  Recheck patient in 1 week and monitor renal function closely.  We may have to permit an elevated baseline creatinine of 1.7 to maintain euvolemia in the future.  Also obtain INR today.

## 2017-10-15 LAB — BASIC METABOLIC PANEL
BUN/Creatinine Ratio: 18 (calc) (ref 6–22)
BUN: 25 mg/dL (ref 7–25)
CALCIUM: 8.5 mg/dL — AB (ref 8.6–10.3)
CO2: 22 mmol/L (ref 20–32)
Chloride: 101 mmol/L (ref 98–110)
Creat: 1.4 mg/dL — ABNORMAL HIGH (ref 0.70–1.11)
GLUCOSE: 164 mg/dL — AB (ref 65–99)
POTASSIUM: 3.9 mmol/L (ref 3.5–5.3)
SODIUM: 137 mmol/L (ref 135–146)

## 2017-10-20 ENCOUNTER — Other Ambulatory Visit: Payer: Self-pay | Admitting: Family Medicine

## 2017-10-21 ENCOUNTER — Encounter: Payer: Self-pay | Admitting: Family Medicine

## 2017-10-21 ENCOUNTER — Ambulatory Visit (INDEPENDENT_AMBULATORY_CARE_PROVIDER_SITE_OTHER): Payer: Medicare Other | Admitting: Family Medicine

## 2017-10-21 VITALS — BP 130/74 | HR 72 | Temp 97.4°F | Resp 20 | Ht 67.0 in | Wt 219.0 lb

## 2017-10-21 DIAGNOSIS — I5032 Chronic diastolic (congestive) heart failure: Secondary | ICD-10-CM

## 2017-10-21 NOTE — Progress Notes (Signed)
Subjective:    Patient ID: [name], male    DOB: 05/03/1929, 84 y.o.   MRN: [id number]  HPI  05/29/17 Please see the previous office visit.   patient is currently taking Lasix 120 mg p.o. twice daily in addition to potassium chloride 40 mEq a day.  At his last visit, the patient weighed 224 pounds.  He has diuresed some and his weight is down to 218 pounds today although he still has +1-2 pitting edema in his right leg and +1 pitting edema in his left leg.  He also has faint bibasilar crackles and continues to endorse mild shortness of breath with exertion.  At that time, my plan was: Patient tends to appear fluid overloaded on exam.  Continue Lasix 120 mg p.o. twice daily in addition to potassium 40 mEq a day.  Check BMP to monitor potassium as well as renal function.  Recheck BNP as it was over 800 at his first visit.  Based on lab work, will determine the length of time to continue aggressive diuresis.  For now I will make no changes in his medication.  If renal function is worsening, we may have to proceed cautiously but for the time being I am planning on continuing the higher dose of Lasix through the weekend into next week.  06/02/17 Patient has lost an additional 3 pounds.  He states that his breathing is much better.  His dyspnea on exertion has improved.  The crackles and rales in his lower lungs have subsided.  His shortness of breath has improved.  He continues to have pitting edema in both legs right greater than left however a lot of this appears to be due to chronic venous insufficiency.  He is not wearing compression hose.  The edema in his legs does not bother him.  His renal function was deteriorating when I checked it last week making me hesitant to aggressively diurese the patient lower than today given that he is asymptomatic.   At that time, my plan was: Patient appears euvolemic although he does have pitting edema in both legs from the mid shin to his ankle which appears  to be edema secondary to chronic venous insufficiency rather than due to congestive heart failure at this point.  I believe we need to maintain his dry weight rather than to pursue further aggressive diuresis.  Decrease Lasix to 80 mg twice daily.  Continue potassium at 40 mEq p.o. daily and recheck a CMP.  Patient has a history of diabetes.  He is overdue for microalbumin as well as a hemoglobin A1c which I will check while he is here today  07/18/17 Patient is here today to recheck his INR.  His last INR was 2.1 at the end of October.  He is checking it every 6 weeks.  Currently on 4 mg of Coumadin every day.  He takes this for atrial fibrillation given his elevated chads score and his recent history of arterial embolization to his fingertips given his previous history of mitral valve repair.  He is also taking Lasix 80 mg twice daily.  Medicine list says 120 mg twice daily but the patient is actually taking 80 mg twice daily.  His weight is slightly higher than his last visit but he is also wearing a coat and have a close due to the recent snowstorm.  Patient states his weight is relatively unchanged since his last visit in October.  He has +2 pitting edema in his right leg  and only trace pitting edema in his left leg.  This is a chronic problem for the patient.  He has some mild basilar crackles in his lung but actually appears relatively euvolemic given his previous history.  He denies any shortness of breath or dyspnea on exertion.  At that time, my plan was: 'Weight is relatively stable compared to his last office visit.  The slight increase in his weight is due to his winter close.  Therefore he is essentially euvolemic as he was at his last visit taking Lasix 80 mg twice daily.  He does have significant pitting edema in his right leg however this is a chronic finding.  His left leg has only trace pitting edema in his lungs are relatively clear.  He denies any orthopnea or dyspnea on exertion.  Therefore  I believe he is euvolemic and I will make no changes in his Lasix dose.  INR was checked today and was found to be 2.5 and therapeutic.  Continue Coumadin 4 mg a day and recheck INR in 6 weeks.  Continue Lasix 80 mg twice daily.  I will check a BMP while the patient is here to assess his renal function and potassium  08/08/16 Since the patient's last visit, he has gained roughly 9 pounds. He now has +2 pitting edema in his right leg and +1 pitting edema in his left leg which is more than his baseline. He has bibasilar crackles and increasing shortness of breath and dyspnea on exertion. He is now winded with minimal activity. He denies any chest pain. He denies any orthopnea. He denies any paroxysmal nocturnal dyspnea. He has been taking Lasix 80 mg twice daily and potassium chloride 40 mEq daily.  At that time, my plan was: Increase Lasix to 120 mg by mouth twice a day, continue potassium chloride 40 mEq daily, add spironolactone 25 mg daily. Recheck on Monday and obtain a BMP at that time. His potassium level in December was 3.6 on his current regimen  08/12/16 Patient slowly seems to be improving. His weight is down 3 pounds since when I saw him 4 days ago. He continues to have significant pitting edema in his right leg which is chronic and trace pitting edema in his left leg. On exam, he also has bibasilar crackles left slightly worse than right.  However the patient feels that his breathing is at his baseline. He denies any new dyspnea on exertion and he denies any chest pain. He recently developed a cold over the last 24 hours. Symptoms include head congestion and a nonproductive cough. He denies any orthopnea. Cough is more of a tickle in his throat. He has used some of his wife's Hycodan for the cough and it seemed to help and he would like a prescription of his own.  At that time, my plan was: Patient slowly seems to be diuresing. I would like to recheck him in one week. Meanwhile today make no changes  in his medication and recheck a BMP to monitor his renal function and potassium on elevated dose of Lasix as well as the new dose of spironolactone. Changes may need to be made in his potassium depending upon the lab results. I did give him a prescription for Hycodan but I cautioned him to use this sparingly and only as needed for an upper respiratory infection. Do not believe the cough is related to pulmonary edema. However if the patient develops worsening shortness of breath or fever, he is to call me  immediately. Otherwise recheck in one week  08/19/17  Patient has lost 8 pounds since his last visit.  His breathing is noticeably improved.  The swelling in both legs is also much better.  His lungs are clear although he does have a chronic left basilar posterior crackle.  However he appears euvolemic today and adequately diuresed.  He denies any chest pain or shortness of breath.  His INR is therapeutic at 2.9.  At that time, my plan was: Patient appears euvolemic today.  Continue Lasix 120 mg twice daily, potassium chloride 40 mEq p.o. daily, spironolactone 25 mg a day.  Recheck BMP today to monitor potassium and renal function.  If stable follow-up again in 1 month.  Continue current dose of warfarin as his INR is therapeutic   09/05/17 Wt Readings from Last 3 Encounters:  10/21/17 219 lb (99.3 kg)  10/14/17 218 lb (98.9 kg)  09/19/17 208 lb (94.3 kg)  Patient has lost 12 additional lbs since last visit and also reports cramps.  After his last visit, there was a noticeable increase in his creatinine.  Therefore we changed his Lasix dose from 120 mg twice daily to 120 mg in the morning and 80 mg.  Despite that, the patient continues to lose weight.  There is now no edema in his legs.  His lungs are completely clear to auscultation.  Patient shows no evidence of fluid overload.  I am concerned about possible dehydration.  I believe 210 pounds is closer to his dry weight (204 on his scales).  At that  time, my plan was: Patient appears euvolemic or slightly dehydrated.  Recheck renal function.  Decrease Lasix to 80 mg twice daily.  Continue spironolactone 25 mg a day which seems to have augmented his diuresis substantially.  Continue potassium 40 mEq a day unless BMP dictates further changes.  Recheck weight in 1 week.  Recheck INR in 2 weeks.  09/19/17 His weight on his home scales has been averaging around 200-201 pounds.  Today on my scales he is 208 pounds.  According to my scales around 210 is his dry weight.  Therefore he is stable with regards to his weight.  He appears euvolemic with no evidence of dehydration.  He denies any dizziness or syncope or near syncope.  His INR today in the office is 3.0 and therapeutic.  At that time, my plan was: Patient appears euvolemic.  I believe we have hit the perfect dose for him/combination of medication.  Continue Lasix 80 mg twice daily and spironolactone 25 mg daily.  Repeat BMP today.  If renal function is stable, I would maintain this dose indefinitely unless we see rapid fluctuations in his weight.  His INR is therapeutic however I have asked him to take just a half of a Coumadin tablet tomorrow.  This will be a one-time dose correction to allow his blood to thicken slightly.  Then resume 1 pill a day thereafter.  Recheck in 6 weeks  10/14/17 On his labs 2/15, CR was up to 1.7 and spironolactone was held.  Repeat bp 1 week later was improved and spironolactone was continued to be held.  Patient is currently on lasix 80 mg po bid only.  Since spironolactone was held, weight has increased from 208 lbs to 218 lbs.  He is experiencing recurrent swelling in his ankles and legs.  Patient has +1 pitting in both legs to the knees  He also has bibasilar crackles again.  He denies sob, doe, orthopnea.  At that time, my plan was: Continue Lasix 80 mg p.o. twice daily.  Resume Spironolactone 25 mg p.o. every morning.  Obtain baseline BMP today.  Recheck patient in 1  week and monitor renal function closely.  We may have to permit an elevated baseline creatinine of 1.7 to maintain euvolemia in the future.  Also obtain INR today.  10/21/17 Patient has gained an additional pound since last week despite resuming spironolactone 25 mg a day.  He is continuing to take Lasix 80 mg twice daily.  He does report dyspnea on exertion.  He denies any orthopnea.  He denies any paroxysmal nocturnal dyspnea.  He denies any chest pain. Past Medical History:  Diagnosis Date  . Atelectasis of right lung   . Chronic diastolic (congestive) heart failure ([place])    a. 10/2013 Echo: EF 55-60%, basal-mid anteroseptal and basal-mid inferoseptal HK.  . Chronic fatigue   . Chronic subdural hematoma ([place])    a. 12/2015 Head CT: chronic right holo-hemispheric SDH w/o midline shift.  . Coronary artery disease    a. 11/2014 NSTEMI: DESx 2 placed to LAD and RCA; b. 08/2015 Cath: LAD patent stent, LCX 44m, RCA patent stent, 110m.  . Diabetes mellitus, type II ([place])    a. HgA1c 6.8 in 03/2015  . ED (erectile dysfunction)   . Gout   . Hemangioma of liver 08/02/2015   12 mm enhancing lesion seen on CT - suspicious for benign hemangioma but not diagnostic - MRI recommended  . HLD (hyperlipidemia)   . Hypertensive heart disease   . Kidney stones   . Lower extremity edema    a. chronic  . OA (osteoarthritis)   . Orthostatic hypotension   . PAF (paroxysmal atrial fibrillation) ([place])    a. 10/2015 s/p DCCV;  b. CHA2DS2VASc = 6--> on coumadin; c. 10/2015 Echo: sev dil LA.  Marland Kitchen Physical deconditioning   . Prostate cancer ([place])   . Pulmonary HTN ([place])   . Pulmonary hypertension ([place])    a. 10/2015 Echo: PASP 19mmHg.  Marland Kitchen Severe mitral regurgitation    a.  10/2015 s/p minimally invasive MV repair; b. 10/2015 Echo: mod MS, mean grad 79mmHg.  Marland Kitchen Spinal stenosis   . SVT (supraventricular tachycardia) ([facility])    a. recurrent, usually responsive to vagal manuevers  . Tricuspid regurgitation    a. 10/2015 Echo:  mild to mod TR.   Past Surgical History:  Procedure Laterality Date  . BACK SURGERY    . Back surgery x 2    . CARDIAC CATHETERIZATION N/A 08/08/2015   Procedure: Right/Left Heart Cath and Coronary Angiography;  Surgeon: Sherren Mocha, MD;  Location: [facility];  Service: Cardiovascular;  Laterality: N/A;  . CARDIAC SURGERY     2 cardiac stents  . CARDIOVERSION N/A 10/20/2015   Procedure: CARDIOVERSION;  Surgeon: Pixie Casino, MD;  Location: [facility];  Service: Cardiovascular;  Laterality: N/A;  . CERVICAL SPINE SURGERY    . CHOLECYSTECTOMY N/A 07/08/2016   Procedure: LAPAROSCOPIC CHOLECYSTECTOMY WITH INTRAOPERATIVE CHOLANGIOGRAM;  Surgeon: Georganna Skeans, MD;  Location: [facility];  Service: General;  Laterality: N/A;  . EYE SURGERY    . INTRAOPERATIVE TRANSESOPHAGEAL ECHOCARDIOGRAM  10/05/2015   Procedure: INTRAOPERATIVE TRANSESOPHAGEAL ECHOCARDIOGRAM;  Surgeon: Rexene Alberts, MD;  Location: [facility];  Service: Open Heart Surgery;;  . JOINT REPLACEMENT     shoulder  . LEFT HEART CATHETERIZATION WITH CORONARY ANGIOGRAM N/A 11/16/2014   Procedure: LEFT HEART CATHETERIZATION WITH CORONARY ANGIOGRAM;  Surgeon: Marcello Moores  Floyce Stakes, MD;  Location: [facility] CATH LAB;  Service: Cardiovascular;  Laterality: N/A;  . MITRAL VALVE REPAIR Right 10/04/2015   Procedure: MINIMALLY INVASIVE MITRAL VALVE REPAIR (MVR);  Surgeon: Rexene Alberts, MD;  Location: [facility];  Service: Open Heart Surgery;  Laterality: Right;  . Rt rotator cuff repair    . TEE WITHOUT CARDIOVERSION N/A 06/22/2015   Procedure: TRANSESOPHAGEAL ECHOCARDIOGRAM (TEE);  Surgeon: Skeet Latch, MD;  Location: [facility];  Service: Cardiovascular;  Laterality: N/A;  . TEE WITHOUT CARDIOVERSION N/A 10/04/2015   Procedure: TRANSESOPHAGEAL ECHOCARDIOGRAM (TEE);  Surgeon: Rexene Alberts, MD;  Location: [facility];  Service: Open Heart Surgery;  Laterality: N/A;  . WOUND EXPLORATION N/A 10/05/2015   Procedure: Sternal Incision;  Surgeon: Rexene Alberts,  MD;  Location: [facility];  Service: Open Heart Surgery;  Laterality: N/A;   Current Outpatient Medications on File Prior to Visit  Medication Sig Dispense Refill  . allopurinol (ZYLOPRIM) 100 MG tablet TAKE ONE (1) TABLET EACH DAY 90 tablet 3  . colchicine 0.6 MG tablet Take 1 tablet (0.6 mg total) by mouth daily. 30 tablet 5  . finasteride (PROSCAR) 5 MG tablet TAKE ONE (1) TABLET BY MOUTH EVERY DAY 90 tablet 3  . furosemide (LASIX) 40 MG tablet Take 80 mg by mouth 2 (two) times daily.    . magnesium oxide (MAG-OX) 400 MG tablet Take 1 tablet (400 mg total) by mouth daily. 30 tablet 0  . metoprolol tartrate (LOPRESSOR) 25 MG tablet TAKE ONE-HALF TABLET BY MOUTH TWICE DAILY 90 tablet 3  . OVER THE COUNTER MEDICATION Neuroquell: Take 1 capsule by mouth once a day for immune health    . potassium chloride (KLOR-CON) 20 MEQ packet Take 40 mEq by mouth daily. 60 packet 0  . pravastatin (PRAVACHOL) 40 MG tablet TAKE ONE (1) TABLET EACH DAY AT 6 P.M. 30 tablet 9  . spironolactone (ALDACTONE) 25 MG tablet Take 1 tablet (25 mg total) by mouth daily. 30 tablet 2  . traMADol (ULTRAM) 50 MG tablet Take 1 tablet (50 mg total) by mouth every 6 (six) hours as needed for moderate pain. 30 tablet 0  . warfarin (COUMADIN) 4 MG tablet Take 1 tablet (4 mg total) by mouth daily. Do not take with any other warfarin unless Dr. Dennard Schaumann instructs. 30 tablet 3  . Wheat Dextrin (BENEFIBER) POWD Take by mouth See admin instructions. Mix one teaspoonful into 6-8 ounces of fluid/milk and drink once daily     No current facility-administered medications on file prior to visit.    No Known Allergies Social History   Socioeconomic History  . Marital status: Married    Spouse name: Not on file  . Number of children: 4  . Years of education: Not on file  . Highest education level: Not on file  Social Needs  . Financial resource strain: Not on file  . Food insecurity - worry: Not on file  . Food insecurity - inability: Not  on file  . Transportation needs - medical: Not on file  . Transportation needs - non-medical: Not on file  Occupational History    Employer: RETIRED  Tobacco Use  . Smoking status: Former Smoker    Types: Cigarettes, Pipe, Landscape architect  . Smokeless tobacco: Former Systems developer    Types: Chew  . Tobacco comment: QUIT SMOKING  MANY YEARS AGO"  Substance and Sexual Activity  . Alcohol use: No  . Drug use: No  . Sexual activity: Not on file  Other Topics Concern  . Not on file  Social History Narrative   ** Merged History Encounter **       Four adopted children.  Lives alone.       Review of Systems  All other systems reviewed and are negative.      Objective:   Physical Exam  Constitutional: He appears well-developed and well-nourished.  Cardiovascular: Normal rate.  Murmur heard. Pulmonary/Chest: Effort normal. He has no wheezes. He has rhonchi.  Abdominal: Soft. Bowel sounds are normal. He exhibits no distension. There is no tenderness. There is no rebound.  Musculoskeletal: He exhibits edema.  Vitals reviewed.  See HPI       Assessment & Plan:  Chronic diastolic CHF (congestive heart failure) (HCC)  Increase Lasix to 120 mg p.o. twice daily and continue Spironolactone 25 mg a day and recheck lab work in weight in 1 week or sooner if worsening

## 2017-10-28 ENCOUNTER — Encounter: Payer: Self-pay | Admitting: Family Medicine

## 2017-10-28 ENCOUNTER — Ambulatory Visit (INDEPENDENT_AMBULATORY_CARE_PROVIDER_SITE_OTHER): Payer: Medicare Other | Admitting: Family Medicine

## 2017-10-28 VITALS — BP 102/58 | HR 60 | Temp 98.3°F | Resp 18 | Ht 67.0 in | Wt 207.0 lb

## 2017-10-28 DIAGNOSIS — I5032 Chronic diastolic (congestive) heart failure: Secondary | ICD-10-CM | POA: Diagnosis not present

## 2017-10-28 DIAGNOSIS — M7989 Other specified soft tissue disorders: Secondary | ICD-10-CM | POA: Diagnosis not present

## 2017-10-28 NOTE — Progress Notes (Signed)
Subjective:    Patient ID: [name], male    DOB: 1928-08-16, 84 y.o.   MRN: [id number]  HPI  05/29/17 Please see the previous office visit.   patient is currently taking Lasix 120 mg p.o. twice daily in addition to potassium chloride 40 mEq a day.  At his last visit, the patient weighed 224 pounds.  He has diuresed some and his weight is down to 218 pounds today although he still has +1-2 pitting edema in his right leg and +1 pitting edema in his left leg.  He also has faint bibasilar crackles and continues to endorse mild shortness of breath with exertion.  At that time, my plan was: Patient tends to appear fluid overloaded on exam.  Continue Lasix 120 mg p.o. twice daily in addition to potassium 40 mEq a day.  Check BMP to monitor potassium as well as renal function.  Recheck BNP as it was over 800 at his first visit.  Based on lab work, will determine the length of time to continue aggressive diuresis.  For now I will make no changes in his medication.  If renal function is worsening, we may have to proceed cautiously but for the time being I am planning on continuing the higher dose of Lasix through the weekend into next week.  06/02/17 Patient has lost an additional 3 pounds.  He states that his breathing is much better.  His dyspnea on exertion has improved.  The crackles and rales in his lower lungs have subsided.  His shortness of breath has improved.  He continues to have pitting edema in both legs right greater than left however a lot of this appears to be due to chronic venous insufficiency.  He is not wearing compression hose.  The edema in his legs does not bother him.  His renal function was deteriorating when I checked it last week making me hesitant to aggressively diurese the patient lower than today given that he is asymptomatic.   At that time, my plan was: Patient appears euvolemic although he does have pitting edema in both legs from the mid shin to his ankle which appears  to be edema secondary to chronic venous insufficiency rather than due to congestive heart failure at this point.  I believe we need to maintain his dry weight rather than to pursue further aggressive diuresis.  Decrease Lasix to 80 mg twice daily.  Continue potassium at 40 mEq p.o. daily and recheck a CMP.  Patient has a history of diabetes.  He is overdue for microalbumin as well as a hemoglobin A1c which I will check while he is here today  07/18/17 Patient is here today to recheck his INR.  His last INR was 2.1 at the end of October.  He is checking it every 6 weeks.  Currently on 4 mg of Coumadin every day.  He takes this for atrial fibrillation given his elevated chads score and his recent history of arterial embolization to his fingertips given his previous history of mitral valve repair.  He is also taking Lasix 80 mg twice daily.  Medicine list says 120 mg twice daily but the patient is actually taking 80 mg twice daily.  His weight is slightly higher than his last visit but he is also wearing a coat and have a close due to the recent snowstorm.  Patient states his weight is relatively unchanged since his last visit in October.  He has +2 pitting edema in his right leg  and only trace pitting edema in his left leg.  This is a chronic problem for the patient.  He has some mild basilar crackles in his lung but actually appears relatively euvolemic given his previous history.  He denies any shortness of breath or dyspnea on exertion.  At that time, my plan was: 'Weight is relatively stable compared to his last office visit.  The slight increase in his weight is due to his winter close.  Therefore he is essentially euvolemic as he was at his last visit taking Lasix 80 mg twice daily.  He does have significant pitting edema in his right leg however this is a chronic finding.  His left leg has only trace pitting edema in his lungs are relatively clear.  He denies any orthopnea or dyspnea on exertion.  Therefore  I believe he is euvolemic and I will make no changes in his Lasix dose.  INR was checked today and was found to be 2.5 and therapeutic.  Continue Coumadin 4 mg a day and recheck INR in 6 weeks.  Continue Lasix 80 mg twice daily.  I will check a BMP while the patient is here to assess his renal function and potassium  08/08/16 Since the patient's last visit, he has gained roughly 9 pounds. He now has +2 pitting edema in his right leg and +1 pitting edema in his left leg which is more than his baseline. He has bibasilar crackles and increasing shortness of breath and dyspnea on exertion. He is now winded with minimal activity. He denies any chest pain. He denies any orthopnea. He denies any paroxysmal nocturnal dyspnea. He has been taking Lasix 80 mg twice daily and potassium chloride 40 mEq daily.  At that time, my plan was: Increase Lasix to 120 mg by mouth twice a day, continue potassium chloride 40 mEq daily, add spironolactone 25 mg daily. Recheck on Monday and obtain a BMP at that time. His potassium level in December was 3.6 on his current regimen  08/12/16 Patient slowly seems to be improving. His weight is down 3 pounds since when I saw him 4 days ago. He continues to have significant pitting edema in his right leg which is chronic and trace pitting edema in his left leg. On exam, he also has bibasilar crackles left slightly worse than right.  However the patient feels that his breathing is at his baseline. He denies any new dyspnea on exertion and he denies any chest pain. He recently developed a cold over the last 24 hours. Symptoms include head congestion and a nonproductive cough. He denies any orthopnea. Cough is more of a tickle in his throat. He has used some of his wife's Hycodan for the cough and it seemed to help and he would like a prescription of his own.  At that time, my plan was: Patient slowly seems to be diuresing. I would like to recheck him in one week. Meanwhile today make no changes  in his medication and recheck a BMP to monitor his renal function and potassium on elevated dose of Lasix as well as the new dose of spironolactone. Changes may need to be made in his potassium depending upon the lab results. I did give him a prescription for Hycodan but I cautioned him to use this sparingly and only as needed for an upper respiratory infection. Do not believe the cough is related to pulmonary edema. However if the patient develops worsening shortness of breath or fever, he is to call me  immediately. Otherwise recheck in one week  08/19/17  Patient has lost 8 pounds since his last visit.  His breathing is noticeably improved.  The swelling in both legs is also much better.  His lungs are clear although he does have a chronic left basilar posterior crackle.  However he appears euvolemic today and adequately diuresed.  He denies any chest pain or shortness of breath.  His INR is therapeutic at 2.9.  At that time, my plan was: Patient appears euvolemic today.  Continue Lasix 120 mg twice daily, potassium chloride 40 mEq p.o. daily, spironolactone 25 mg a day.  Recheck BMP today to monitor potassium and renal function.  If stable follow-up again in 1 month.  Continue current dose of warfarin as his INR is therapeutic   09/05/17 Wt Readings from Last 3 Encounters:  10/28/17 207 lb (93.9 kg)  10/21/17 219 lb (99.3 kg)  10/14/17 218 lb (98.9 kg)  Patient has lost 12 additional lbs since last visit and also reports cramps.  After his last visit, there was a noticeable increase in his creatinine.  Therefore we changed his Lasix dose from 120 mg twice daily to 120 mg in the morning and 80 mg.  Despite that, the patient continues to lose weight.  There is now no edema in his legs.  His lungs are completely clear to auscultation.  Patient shows no evidence of fluid overload.  I am concerned about possible dehydration.  I believe 210 pounds is closer to his dry weight (204 on his scales).  At that  time, my plan was: Patient appears euvolemic or slightly dehydrated.  Recheck renal function.  Decrease Lasix to 80 mg twice daily.  Continue spironolactone 25 mg a day which seems to have augmented his diuresis substantially.  Continue potassium 40 mEq a day unless BMP dictates further changes.  Recheck weight in 1 week.  Recheck INR in 2 weeks.  09/19/17 His weight on his home scales has been averaging around 200-201 pounds.  Today on my scales he is 208 pounds.  According to my scales around 210 is his dry weight.  Therefore he is stable with regards to his weight.  He appears euvolemic with no evidence of dehydration.  He denies any dizziness or syncope or near syncope.  His INR today in the office is 3.0 and therapeutic.  At that time, my plan was: Patient appears euvolemic.  I believe we have hit the perfect dose for him/combination of medication.  Continue Lasix 80 mg twice daily and spironolactone 25 mg daily.  Repeat BMP today.  If renal function is stable, I would maintain this dose indefinitely unless we see rapid fluctuations in his weight.  His INR is therapeutic however I have asked him to take just a half of a Coumadin tablet tomorrow.  This will be a one-time dose correction to allow his blood to thicken slightly.  Then resume 1 pill a day thereafter.  Recheck in 6 weeks  10/14/17 On his labs 2/15, CR was up to 1.7 and spironolactone was held.  Repeat bp 1 week later was improved and spironolactone was continued to be held.  Patient is currently on lasix 80 mg po bid only.  Since spironolactone was held, weight has increased from 208 lbs to 218 lbs.  He is experiencing recurrent swelling in his ankles and legs.  Patient has +1 pitting in both legs to the knees  He also has bibasilar crackles again.  He denies sob, doe, orthopnea.  At that time, my plan was: Continue Lasix 80 mg p.o. twice daily.  Resume Spironolactone 25 mg p.o. every morning.  Obtain baseline BMP today.  Recheck patient in 1  week and monitor renal function closely.  We may have to permit an elevated baseline creatinine of 1.7 to maintain euvolemia in the future.  Also obtain INR today.  10/21/17 Patient has gained an additional pound since last week despite resuming spironolactone 25 mg a day.  He is continuing to take Lasix 80 mg twice daily.  He does report dyspnea on exertion.  He denies any orthopnea.  He denies any paroxysmal nocturnal dyspnea.  He denies any chest pain.  At that time, my plan was:  Increase Lasix to 120 mg p.o. twice daily and continue Spironolactone 25 mg a day and recheck lab work in weight in 1 week or sooner if worsening  10/28/17 Since his last visit, the patient's weight has dropped 12 pounds from 219-207.  The edema in his legs is completely resolved at the crackles in his lungs are improved.  I believe his dry weight is roughly 210 pounds.  He denies any dry mouth or dizziness Past Medical History:  Diagnosis Date  . Atelectasis of right lung   . Chronic diastolic (congestive) heart failure ([place])    a. 10/2013 Echo: EF 55-60%, basal-mid anteroseptal and basal-mid inferoseptal HK.  . Chronic fatigue   . Chronic subdural hematoma ([place])    a. 12/2015 Head CT: chronic right holo-hemispheric SDH w/o midline shift.  . Coronary artery disease    a. 11/2014 NSTEMI: DESx 2 placed to LAD and RCA; b. 08/2015 Cath: LAD patent stent, LCX 86m, RCA patent stent, 70m.  . Diabetes mellitus, type II ([place])    a. HgA1c 6.8 in 03/2015  . ED (erectile dysfunction)   . Gout   . Hemangioma of liver 08/02/2015   12 mm enhancing lesion seen on CT - suspicious for benign hemangioma but not diagnostic - MRI recommended  . HLD (hyperlipidemia)   . Hypertensive heart disease   . Kidney stones   . Lower extremity edema    a. chronic  . OA (osteoarthritis)   . Orthostatic hypotension   . PAF (paroxysmal atrial fibrillation) ([place])    a. 10/2015 s/p DCCV;  b. CHA2DS2VASc = 6--> on coumadin; c. 10/2015 Echo: sev dil  LA.  Marland Kitchen Physical deconditioning   . Prostate cancer ([place])   . Pulmonary HTN ([place])   . Pulmonary hypertension ([place])    a. 10/2015 Echo: PASP 65mmHg.  Marland Kitchen Severe mitral regurgitation    a.  10/2015 s/p minimally invasive MV repair; b. 10/2015 Echo: mod MS, mean grad 11mmHg.  Marland Kitchen Spinal stenosis   . SVT (supraventricular tachycardia) ([facility])    a. recurrent, usually responsive to vagal manuevers  . Tricuspid regurgitation    a. 10/2015 Echo: mild to mod TR.   Past Surgical History:  Procedure Laterality Date  . BACK SURGERY    . Back surgery x 2    . CARDIAC CATHETERIZATION N/A 08/08/2015   Procedure: Right/Left Heart Cath and Coronary Angiography;  Surgeon: Sherren Mocha, MD;  Location: [facility];  Service: Cardiovascular;  Laterality: N/A;  . CARDIAC SURGERY     2 cardiac stents  . CARDIOVERSION N/A 10/20/2015   Procedure: CARDIOVERSION;  Surgeon: Pixie Casino, MD;  Location: [facility];  Service: Cardiovascular;  Laterality: N/A;  . CERVICAL SPINE SURGERY    . CHOLECYSTECTOMY N/A 07/08/2016  Procedure: LAPAROSCOPIC CHOLECYSTECTOMY WITH INTRAOPERATIVE CHOLANGIOGRAM;  Surgeon: Georganna Skeans, MD;  Location: [facility];  Service: General;  Laterality: N/A;  . EYE SURGERY    . INTRAOPERATIVE TRANSESOPHAGEAL ECHOCARDIOGRAM  10/05/2015   Procedure: INTRAOPERATIVE TRANSESOPHAGEAL ECHOCARDIOGRAM;  Surgeon: Rexene Alberts, MD;  Location: [facility];  Service: Open Heart Surgery;;  . JOINT REPLACEMENT     shoulder  . LEFT HEART CATHETERIZATION WITH CORONARY ANGIOGRAM N/A 11/16/2014   Procedure: LEFT HEART CATHETERIZATION WITH CORONARY ANGIOGRAM;  Surgeon: Troy Sine, MD;  Location: [facility] C[facility];  Service: Cardiovascular;  Laterality: N/A;  . MITRAL VALVE REPAIR Right 10/04/2015   Procedure: MINIMALLY INVASIVE MITRAL VALVE REPAIR (MVR);  Surgeon: Rexene Alberts, MD;  Location: [facility];  Service: Open Heart Surgery;  Laterality: Right;  . Rt rotator cuff repair    . TEE WITHOUT CARDIOVERSION N/A  06/22/2015   Procedure: TRANSESOPHAGEAL ECHOCARDIOGRAM (TEE);  Surgeon: Skeet Latch, MD;  Location: [facility];  Service: Cardiovascular;  Laterality: N/A;  . TEE WITHOUT CARDIOVERSION N/A 10/04/2015   Procedure: TRANSESOPHAGEAL ECHOCARDIOGRAM (TEE);  Surgeon: Rexene Alberts, MD;  Location: [facility];  Service: Open Heart Surgery;  Laterality: N/A;  . WOUND EXPLORATION N/A 10/05/2015   Procedure: Sternal Incision;  Surgeon: Rexene Alberts, MD;  Location: [facility];  Service: Open Heart Surgery;  Laterality: N/A;   Current Outpatient Medications on File Prior to Visit  Medication Sig Dispense Refill  . allopurinol (ZYLOPRIM) 100 MG tablet TAKE ONE (1) TABLET EACH DAY 90 tablet 3  . colchicine 0.6 MG tablet Take 1 tablet (0.6 mg total) by mouth daily. 30 tablet 5  . finasteride (PROSCAR) 5 MG tablet TAKE ONE (1) TABLET BY MOUTH EVERY DAY 90 tablet 3  . furosemide (LASIX) 40 MG tablet Take 80 mg by mouth 2 (two) times daily.    . magnesium oxide (MAG-OX) 400 MG tablet Take 1 tablet (400 mg total) by mouth daily. 30 tablet 0  . metoprolol tartrate (LOPRESSOR) 25 MG tablet TAKE ONE-HALF TABLET BY MOUTH TWICE DAILY 90 tablet 3  . OVER THE COUNTER MEDICATION Neuroquell: Take 1 capsule by mouth once a day for immune health    . potassium chloride (KLOR-CON) 20 MEQ packet Take 40 mEq by mouth daily. 60 packet 0  . pravastatin (PRAVACHOL) 40 MG tablet TAKE ONE (1) TABLET EACH DAY AT 6 P.M. 30 tablet 9  . spironolactone (ALDACTONE) 25 MG tablet Take 1 tablet (25 mg total) by mouth daily. 30 tablet 2  . traMADol (ULTRAM) 50 MG tablet Take 1 tablet (50 mg total) by mouth every 6 (six) hours as needed for moderate pain. 30 tablet 0  . warfarin (COUMADIN) 4 MG tablet Take 1 tablet (4 mg total) by mouth daily. Do not take with any other warfarin unless Dr. Dennard Schaumann instructs. 30 tablet 3  . Wheat Dextrin (BENEFIBER) POWD Take by mouth See admin instructions. Mix one teaspoonful into 6-8 ounces of fluid/milk and  drink once daily     No current facility-administered medications on file prior to visit.    No Known Allergies Social History   Socioeconomic History  . Marital status: Married    Spouse name: Not on file  . Number of children: 4  . Years of education: Not on file  . Highest education level: Not on file  Occupational History    Employer: RETIRED  Social Needs  . Financial resource strain: Not on file  . Food insecurity:    Worry: Not on  file    Inability: Not on file  . Transportation needs:    Medical: Not on file    Non-medical: Not on file  Tobacco Use  . Smoking status: Former Smoker    Types: Cigarettes, Pipe, Landscape architect  . Smokeless tobacco: Former Systems developer    Types: Chew  . Tobacco comment: QUIT SMOKING  MANY YEARS AGO"  Substance and Sexual Activity  . Alcohol use: No  . Drug use: No  . Sexual activity: Not on file  Lifestyle  . Physical activity:    Days per week: Not on file    Minutes per session: Not on file  . Stress: Not on file  Relationships  . Social connections:    Talks on phone: Not on file    Gets together: Not on file    Attends religious service: Not on file    Active member of club or organization: Not on file    Attends meetings of clubs or organizations: Not on file    Relationship status: Not on file  . Intimate partner violence:    Fear of current or ex partner: Not on file    Emotionally abused: Not on file    Physically abused: Not on file    Forced sexual activity: Not on file  Other Topics Concern  . Not on file  Social History Narrative   ** Merged History Encounter **       Four adopted children.  Lives alone.       Review of Systems  All other systems reviewed and are negative.      Objective:   Physical Exam  Constitutional: He appears well-developed and well-nourished.  Cardiovascular: Normal rate.  Murmur heard. Pulmonary/Chest: Effort normal. He has no wheezes. He has rhonchi.  Abdominal: Soft. Bowel sounds are  normal. He exhibits no distension. There is no tenderness. There is no rebound.  Musculoskeletal: He exhibits edema.  Vitals reviewed.  See HPI       Assessment & Plan:  Leg swelling - Plan: BASIC METABOLIC PANEL WITH GFR  Chronic diastolic CHF (congestive heart failure) (H[facility]) Check BMP today.  Watch closely for prerenal azotemia.  Will likely need to decrease his diuretic dose.  We will most likely discontinue spironolactone if creatinine is elevated.

## 2017-10-29 ENCOUNTER — Other Ambulatory Visit: Payer: Self-pay | Admitting: Family Medicine

## 2017-10-29 DIAGNOSIS — R944 Abnormal results of kidney function studies: Secondary | ICD-10-CM

## 2017-10-29 LAB — BASIC METABOLIC PANEL WITH GFR
BUN / CREAT RATIO: 23 (calc) — AB (ref 6–22)
BUN: 33 mg/dL — ABNORMAL HIGH (ref 7–25)
CO2: 25 mmol/L (ref 20–32)
Calcium: 8.8 mg/dL (ref 8.6–10.3)
Chloride: 98 mmol/L (ref 98–110)
Creat: 1.45 mg/dL — ABNORMAL HIGH (ref 0.70–1.11)
GFR, Est African American: 50 mL/min/{1.73_m2} — ABNORMAL LOW (ref >=60)
GFR, Est Non African American: 43 mL/min/{1.73_m2} — ABNORMAL LOW (ref >=60)
GLUCOSE: 189 mg/dL — AB (ref 65–99)
POTASSIUM: 4 mmol/L (ref 3.5–5.3)
SODIUM: 138 mmol/L (ref 135–146)

## 2017-11-06 ENCOUNTER — Other Ambulatory Visit: Payer: Self-pay | Admitting: Family Medicine

## 2017-11-06 NOTE — Telephone Encounter (Signed)
RX set up - please send to pharm

## 2017-11-06 NOTE — Telephone Encounter (Signed)
Pt requesting a refill on his Hycodan from January - Pharmacy called stating pt was requesting this d/t him having a cough. OK to refill?

## 2017-11-06 NOTE — Telephone Encounter (Signed)
Ok with refill x 1

## 2017-11-07 ENCOUNTER — Other Ambulatory Visit: Payer: Self-pay | Admitting: Family Medicine

## 2017-11-07 MED ORDER — HYDROCODONE-HOMATROPINE 5-1.5 MG/5ML PO SYRP: 5.0000 mL | ORAL_SOLUTION | Freq: Three times a day (TID) | ORAL | 0 refills | Status: DC | PRN

## 2017-11-07 MED ORDER — WARFARIN SODIUM 4 MG PO TABS: ORAL_TABLET | ORAL | 3 refills | Status: DC

## 2017-11-10 ENCOUNTER — Telehealth: Payer: Self-pay | Admitting: Family Medicine

## 2017-11-10 MED ORDER — FUROSEMIDE 40 MG PO TABS: 80.0000 mg | ORAL_TABLET | Freq: Two times a day (BID) | ORAL | 2 refills | Status: DC

## 2017-11-10 NOTE — Telephone Encounter (Signed)
Pt needs refill on lasix to [facility].

## 2017-11-10 NOTE — Telephone Encounter (Signed)
Medication called/sent to requested pharmacy  

## 2017-11-12 ENCOUNTER — Other Ambulatory Visit: Payer: Medicare Other

## 2017-11-12 DIAGNOSIS — R944 Abnormal results of kidney function studies: Secondary | ICD-10-CM

## 2017-11-13 LAB — BASIC METABOLIC PANEL
BUN/Creatinine Ratio: 23 (calc) — ABNORMAL HIGH (ref 6–22)
BUN: 36 mg/dL — ABNORMAL HIGH (ref 7–25)
CALCIUM: 8.8 mg/dL (ref 8.6–10.3)
CHLORIDE: 99 mmol/L (ref 98–110)
CO2: 26 mmol/L (ref 20–32)
Creat: 1.59 mg/dL — ABNORMAL HIGH (ref 0.70–1.11)
Glucose, Bld: 111 mg/dL — ABNORMAL HIGH (ref 65–99)
Potassium: 4 mmol/L (ref 3.5–5.3)
Sodium: 137 mmol/L (ref 135–146)

## 2017-11-27 ENCOUNTER — Other Ambulatory Visit: Payer: Self-pay | Admitting: Family Medicine

## 2017-11-28 ENCOUNTER — Other Ambulatory Visit: Payer: Self-pay | Admitting: Family Medicine

## 2017-11-28 MED ORDER — SPIRONOLACTONE 25 MG PO TABS: 25.0000 mg | ORAL_TABLET | Freq: Every day | ORAL | 1 refills | Status: DC

## 2017-12-15 ENCOUNTER — Ambulatory Visit (INDEPENDENT_AMBULATORY_CARE_PROVIDER_SITE_OTHER): Payer: Medicare Other | Admitting: Family Medicine

## 2017-12-15 ENCOUNTER — Encounter: Payer: Self-pay | Admitting: Family Medicine

## 2017-12-15 VITALS — BP 118/60 | HR 52 | Temp 97.8°F | Resp 18 | Ht 67.0 in | Wt 205.0 lb

## 2017-12-15 DIAGNOSIS — E119 Type 2 diabetes mellitus without complications: Secondary | ICD-10-CM | POA: Diagnosis not present

## 2017-12-15 DIAGNOSIS — I480 Paroxysmal atrial fibrillation: Secondary | ICD-10-CM

## 2017-12-15 DIAGNOSIS — I5022 Chronic systolic (congestive) heart failure: Secondary | ICD-10-CM

## 2017-12-15 LAB — PT WITH INR/FINGERSTICK
INR FINGERSTICK: 3.4 ratio — AB
PT FINGERSTICK: 40.4 s — AB (ref 10.5–13.1)

## 2017-12-15 NOTE — Progress Notes (Signed)
Subjective:    Patient ID: [name], male    DOB: Dec 08, 1928, 84 y.o.   MRN: [id number]  HPI  05/29/17 Please see the previous office visit.   patient is currently taking Lasix 120 mg p.o. twice daily in addition to potassium chloride 40 mEq a day.  At his last visit, the patient weighed 224 pounds.  He has diuresed some and his weight is down to 218 pounds today although he still has +1-2 pitting edema in his right leg and +1 pitting edema in his left leg.  He also has faint bibasilar crackles and continues to endorse mild shortness of breath with exertion.  At that time, my plan was: Patient tends to appear fluid overloaded on exam.  Continue Lasix 120 mg p.o. twice daily in addition to potassium 40 mEq a day.  Check BMP to monitor potassium as well as renal function.  Recheck BNP as it was over 800 at his first visit.  Based on lab work, will determine the length of time to continue aggressive diuresis.  For now I will make no changes in his medication.  If renal function is worsening, we may have to proceed cautiously but for the time being I am planning on continuing the higher dose of Lasix through the weekend into next week.  06/02/17 Patient has lost an additional 3 pounds.  He states that his breathing is much better.  His dyspnea on exertion has improved.  The crackles and rales in his lower lungs have subsided.  His shortness of breath has improved.  He continues to have pitting edema in both legs right greater than left however a lot of this appears to be due to chronic venous insufficiency.  He is not wearing compression hose.  The edema in his legs does not bother him.  His renal function was deteriorating when I checked it last week making me hesitant to aggressively diurese the patient lower than today given that he is asymptomatic.   At that time, my plan was: Patient appears euvolemic although he does have pitting edema in both legs from the mid shin to his ankle which appears  to be edema secondary to chronic venous insufficiency rather than due to congestive heart failure at this point.  I believe we need to maintain his dry weight rather than to pursue further aggressive diuresis.  Decrease Lasix to 80 mg twice daily.  Continue potassium at 40 mEq p.o. daily and recheck a CMP.  Patient has a history of diabetes.  He is overdue for microalbumin as well as a hemoglobin A1c which I will check while he is here today  07/18/17 Patient is here today to recheck his INR.  His last INR was 2.1 at the end of October.  He is checking it every 6 weeks.  Currently on 4 mg of Coumadin every day.  He takes this for atrial fibrillation given his elevated chads score and his recent history of arterial embolization to his fingertips given his previous history of mitral valve repair.  He is also taking Lasix 80 mg twice daily.  Medicine list says 120 mg twice daily but the patient is actually taking 80 mg twice daily.  His weight is slightly higher than his last visit but he is also wearing a coat and have a close due to the recent snowstorm.  Patient states his weight is relatively unchanged since his last visit in October.  He has +2 pitting edema in his right leg  and only trace pitting edema in his left leg.  This is a chronic problem for the patient.  He has some mild basilar crackles in his lung but actually appears relatively euvolemic given his previous history.  He denies any shortness of breath or dyspnea on exertion.  At that time, my plan was: 'Weight is relatively stable compared to his last office visit.  The slight increase in his weight is due to his winter close.  Therefore he is essentially euvolemic as he was at his last visit taking Lasix 80 mg twice daily.  He does have significant pitting edema in his right leg however this is a chronic finding.  His left leg has only trace pitting edema in his lungs are relatively clear.  He denies any orthopnea or dyspnea on exertion.  Therefore  I believe he is euvolemic and I will make no changes in his Lasix dose.  INR was checked today and was found to be 2.5 and therapeutic.  Continue Coumadin 4 mg a day and recheck INR in 6 weeks.  Continue Lasix 80 mg twice daily.  I will check a BMP while the patient is here to assess his renal function and potassium  08/08/16 Since the patient's last visit, he has gained roughly 9 pounds. He now has +2 pitting edema in his right leg and +1 pitting edema in his left leg which is more than his baseline. He has bibasilar crackles and increasing shortness of breath and dyspnea on exertion. He is now winded with minimal activity. He denies any chest pain. He denies any orthopnea. He denies any paroxysmal nocturnal dyspnea. He has been taking Lasix 80 mg twice daily and potassium chloride 40 mEq daily.  At that time, my plan was: Increase Lasix to 120 mg by mouth twice a day, continue potassium chloride 40 mEq daily, add spironolactone 25 mg daily. Recheck on Monday and obtain a BMP at that time. His potassium level in December was 3.6 on his current regimen  08/12/16 Patient slowly seems to be improving. His weight is down 3 pounds since when I saw him 4 days ago. He continues to have significant pitting edema in his right leg which is chronic and trace pitting edema in his left leg. On exam, he also has bibasilar crackles left slightly worse than right.  However the patient feels that his breathing is at his baseline. He denies any new dyspnea on exertion and he denies any chest pain. He recently developed a cold over the last 24 hours. Symptoms include head congestion and a nonproductive cough. He denies any orthopnea. Cough is more of a tickle in his throat. He has used some of his wife's Hycodan for the cough and it seemed to help and he would like a prescription of his own.  At that time, my plan was: Patient slowly seems to be diuresing. I would like to recheck him in one week. Meanwhile today make no changes  in his medication and recheck a BMP to monitor his renal function and potassium on elevated dose of Lasix as well as the new dose of spironolactone. Changes may need to be made in his potassium depending upon the lab results. I did give him a prescription for Hycodan but I cautioned him to use this sparingly and only as needed for an upper respiratory infection. Do not believe the cough is related to pulmonary edema. However if the patient develops worsening shortness of breath or fever, he is to call me  immediately. Otherwise recheck in one week  08/19/17  Patient has lost 8 pounds since his last visit.  His breathing is noticeably improved.  The swelling in both legs is also much better.  His lungs are clear although he does have a chronic left basilar posterior crackle.  However he appears euvolemic today and adequately diuresed.  He denies any chest pain or shortness of breath.  His INR is therapeutic at 2.9.  At that time, my plan was: Patient appears euvolemic today.  Continue Lasix 120 mg twice daily, potassium chloride 40 mEq p.o. daily, spironolactone 25 mg a day.  Recheck BMP today to monitor potassium and renal function.  If stable follow-up again in 1 month.  Continue current dose of warfarin as his INR is therapeutic   09/05/17 Wt Readings from Last 3 Encounters:  10/28/17 207 lb (93.9 kg)  10/21/17 219 lb (99.3 kg)  10/14/17 218 lb (98.9 kg)  Patient has lost 12 additional lbs since last visit and also reports cramps.  After his last visit, there was a noticeable increase in his creatinine.  Therefore we changed his Lasix dose from 120 mg twice daily to 120 mg in the morning and 80 mg.  Despite that, the patient continues to lose weight.  There is now no edema in his legs.  His lungs are completely clear to auscultation.  Patient shows no evidence of fluid overload.  I am concerned about possible dehydration.  I believe 210 pounds is closer to his dry weight (204 on his scales).  At that  time, my plan was: Patient appears euvolemic or slightly dehydrated.  Recheck renal function.  Decrease Lasix to 80 mg twice daily.  Continue spironolactone 25 mg a day which seems to have augmented his diuresis substantially.  Continue potassium 40 mEq a day unless BMP dictates further changes.  Recheck weight in 1 week.  Recheck INR in 2 weeks.  09/19/17 His weight on his home scales has been averaging around 200-201 pounds.  Today on my scales he is 208 pounds.  According to my scales around 210 is his dry weight.  Therefore he is stable with regards to his weight.  He appears euvolemic with no evidence of dehydration.  He denies any dizziness or syncope or near syncope.  His INR today in the office is 3.0 and therapeutic.  At that time, my plan was: Patient appears euvolemic.  I believe we have hit the perfect dose for him/combination of medication.  Continue Lasix 80 mg twice daily and spironolactone 25 mg daily.  Repeat BMP today.  If renal function is stable, I would maintain this dose indefinitely unless we see rapid fluctuations in his weight.  His INR is therapeutic however I have asked him to take just a half of a Coumadin tablet tomorrow.  This will be a one-time dose correction to allow his blood to thicken slightly.  Then resume 1 pill a day thereafter.  Recheck in 6 weeks  10/14/17 On his labs 2/15, CR was up to 1.7 and spironolactone was held.  Repeat bp 1 week later was improved and spironolactone was continued to be held.  Patient is currently on lasix 80 mg po bid only.  Since spironolactone was held, weight has increased from 208 lbs to 218 lbs.  He is experiencing recurrent swelling in his ankles and legs.  Patient has +1 pitting in both legs to the knees  He also has bibasilar crackles again.  He denies sob, doe, orthopnea.  At that time, my plan was: Continue Lasix 80 mg p.o. twice daily.  Resume Spironolactone 25 mg p.o. every morning.  Obtain baseline BMP today.  Recheck patient in 1  week and monitor renal function closely.  We may have to permit an elevated baseline creatinine of 1.7 to maintain euvolemia in the future.  Also obtain INR today.  10/21/17 Patient has gained an additional pound since last week despite resuming spironolactone 25 mg a day.  He is continuing to take Lasix 80 mg twice daily.  He does report dyspnea on exertion.  He denies any orthopnea.  He denies any paroxysmal nocturnal dyspnea.  He denies any chest pain.  At that time, my plan was:  Increase Lasix to 120 mg p.o. twice daily and continue Spironolactone 25 mg a day and recheck lab work in weight in 1 week or sooner if worsening  10/28/17 Since his last visit, the patient's weight has dropped 12 pounds from 219-207.  The edema in his legs is completely resolved and the crackles in his lungs are improved.  I believe his dry weight is roughly 210 pounds.  He denies any dry mouth or dizziness.  AT that time, my plan was: Check BMP today.  Watch closely for prerenal azotemia.  Will likely need to decrease his diuretic dose.  We will most likely discontinue spironolactone if creatinine is elevated.  12/15/17 However, surprisingly at that visit, his creatinine was better than his baseline.  Therefore I made no changes in his medication but I did quickly recheck his renal function again on April 10 and found to be 1.59 which again is approximate to the patient's baseline.  He is here today to recheck his INR for his chronic anticoagulation due to atrial fibrillation.  He is also due to monitor his weight, his blood pressure, his renal function, as well as his glycemic control for his diabetes.  At his last visit, he weighed 207 pounds.  Today he is 205.  However the patient independently adjust his fluid pills from what I can gather today.  Most days he takes 80 mg of Lasix twice daily in addition to spironolactone.  However as the patient states, sometimes he will only take 1 Lasix pill in the evening if he feels  like he has diuresed too much.  From what I can gather, most days he takes 80 mg twice daily with the spironolactone.  His weight has stayed stable.  His lungs are clear to auscultation bilaterally and there is no evidence of fluid overload.  His INR today is supratherapeutic at 3.4.  He is taking 4 mg of Coumadin every day per his report. Past Medical History:  Diagnosis Date  . Atelectasis of right lung   . Chronic diastolic (congestive) heart failure ([place])    a. 10/2013 Echo: EF 55-60%, basal-mid anteroseptal and basal-mid inferoseptal HK.  . Chronic fatigue   . Chronic subdural hematoma ([place])    a. 12/2015 Head CT: chronic right holo-hemispheric SDH w/o midline shift.  . Coronary artery disease    a. 11/2014 NSTEMI: DESx 2 placed to LAD and RCA; b. 08/2015 Cath: LAD patent stent, LCX 14m, RCA patent stent, 65m.  . Diabetes mellitus, type II ([place])    a. HgA1c 6.8 in 03/2015  . ED (erectile dysfunction)   . Gout   . Hemangioma of liver 08/02/2015   12 mm enhancing lesion seen on CT - suspicious for benign hemangioma but not diagnostic - MRI recommended  .  HLD (hyperlipidemia)   . Hypertensive heart disease   . Kidney stones   . Lower extremity edema    a. chronic  . OA (osteoarthritis)   . Orthostatic hypotension   . PAF (paroxysmal atrial fibrillation) ([place])    a. 10/2015 s/p DCCV;  b. CHA2DS2VASc = 6--> on coumadin; c. 10/2015 Echo: sev dil LA.  Marland Kitchen Physical deconditioning   . Prostate cancer ([place])   . Pulmonary HTN ([place])   . Pulmonary hypertension ([place])    a. 10/2015 Echo: PASP 74mmHg.  Marland Kitchen Severe mitral regurgitation    a.  10/2015 s/p minimally invasive MV repair; b. 10/2015 Echo: mod MS, mean grad 53mmHg.  Marland Kitchen Spinal stenosis   . SVT (supraventricular tachycardia) ([facility])    a. recurrent, usually responsive to vagal manuevers  . Tricuspid regurgitation    a. 10/2015 Echo: mild to mod TR.   Past Surgical History:  Procedure Laterality Date  . BACK SURGERY    . Back surgery x 2    .  CARDIAC CATHETERIZATION N/A 08/08/2015   Procedure: Right/Left Heart Cath and Coronary Angiography;  Surgeon: Sherren Mocha, MD;  Location: [facility];  Service: Cardiovascular;  Laterality: N/A;  . CARDIAC SURGERY     2 cardiac stents  . CARDIOVERSION N/A 10/20/2015   Procedure: CARDIOVERSION;  Surgeon: Pixie Casino, MD;  Location: [facility];  Service: Cardiovascular;  Laterality: N/A;  . CERVICAL SPINE SURGERY    . CHOLECYSTECTOMY N/A 07/08/2016   Procedure: LAPAROSCOPIC CHOLECYSTECTOMY WITH INTRAOPERATIVE CHOLANGIOGRAM;  Surgeon: Georganna Skeans, MD;  Location: [facility];  Service: General;  Laterality: N/A;  . EYE SURGERY    . INTRAOPERATIVE TRANSESOPHAGEAL ECHOCARDIOGRAM  10/05/2015   Procedure: INTRAOPERATIVE TRANSESOPHAGEAL ECHOCARDIOGRAM;  Surgeon: Rexene Alberts, MD;  Location: [facility];  Service: Open Heart Surgery;;  . JOINT REPLACEMENT     shoulder  . LEFT HEART CATHETERIZATION WITH CORONARY ANGIOGRAM N/A 11/16/2014   Procedure: LEFT HEART CATHETERIZATION WITH CORONARY ANGIOGRAM;  Surgeon: Troy Sine, MD;  Location: [facility] CATH LAB;  Service: Cardiovascular;  Laterality: N/A;  . MITRAL VALVE REPAIR Right 10/04/2015   Procedure: MINIMALLY INVASIVE MITRAL VALVE REPAIR (MVR);  Surgeon: Rexene Alberts, MD;  Location: [facility];  Service: Open Heart Surgery;  Laterality: Right;  . Rt rotator cuff repair    . TEE WITHOUT CARDIOVERSION N/A 06/22/2015   Procedure: TRANSESOPHAGEAL ECHOCARDIOGRAM (TEE);  Surgeon: Skeet Latch, MD;  Location: [facility];  Service: Cardiovascular;  Laterality: N/A;  . TEE WITHOUT CARDIOVERSION N/A 10/04/2015   Procedure: TRANSESOPHAGEAL ECHOCARDIOGRAM (TEE);  Surgeon: Rexene Alberts, MD;  Location: [facility];  Service: Open Heart Surgery;  Laterality: N/A;  . WOUND EXPLORATION N/A 10/05/2015   Procedure: Sternal Incision;  Surgeon: Rexene Alberts, MD;  Location: [facility];  Service: Open Heart Surgery;  Laterality: N/A;   Current Outpatient Medications on File  Prior to Visit  Medication Sig Dispense Refill  . allopurinol (ZYLOPRIM) 100 MG tablet TAKE ONE (1) TABLET EACH DAY 90 tablet 3  . colchicine 0.6 MG tablet Take 1 tablet (0.6 mg total) by mouth daily. 30 tablet 5  . finasteride (PROSCAR) 5 MG tablet TAKE ONE (1) TABLET BY MOUTH EVERY DAY 90 tablet 3  . furosemide (LASIX) 40 MG tablet Take 2 tablets (80 mg total) by mouth 2 (two) times daily. 120 tablet 2  . HYDROcodone-homatropine (HYCODAN) 5-1.5 MG/5ML syrup Take 5 mLs by mouth every 8 (eight) hours as needed for cough. 120 mL 0  .  magnesium oxide (MAG-OX) 400 MG tablet Take 1 tablet (400 mg total) by mouth daily. 30 tablet 0  . metoprolol tartrate (LOPRESSOR) 25 MG tablet TAKE ONE-HALF TABLET BY MOUTH TWICE DAILY 90 tablet 3  . OVER THE COUNTER MEDICATION [place]: Take 1 capsule by mouth once a day for immune health    . potassium chloride (KLOR-CON) 20 MEQ packet Take 40 mEq by mouth daily. 60 packet 0  . pravastatin (PRAVACHOL) 40 MG tablet TAKE ONE (1) TABLET EACH DAY AT 6 P.M. 30 tablet 9  . spironolactone (ALDACTONE) 25 MG tablet Take 1 tablet (25 mg total) by mouth daily. 30 tablet 1  . traMADol (ULTRAM) 50 MG tablet Take 1 tablet (50 mg total) by mouth every 6 (six) hours as needed for moderate pain. 30 tablet 0  . warfarin (COUMADIN) 4 MG tablet Take 1 tablet (4 mg total) by mouth daily. Do not take with any other warfarin unless Dr. Dennard Schaumann instructs. 30 tablet 3  . Wheat Dextrin (BENEFIBER) POWD Take by mouth See admin instructions. Mix one teaspoonful into 6-8 ounces of fluid/milk and drink once daily     No current facility-administered medications on file prior to visit.    No Known Allergies Social History   Socioeconomic History  . Marital status: Married    Spouse name: Not on file  . Number of children: 4  . Years of education: Not on file  . Highest education level: Not on file  Occupational History    Employer: RETIRED  Social Needs  . Financial resource strain:  Not on file  . Food insecurity:    Worry: Not on file    Inability: Not on file  . Transportation needs:    Medical: Not on file    Non-medical: Not on file  Tobacco Use  . Smoking status: Former Smoker    Types: Cigarettes, Pipe, Landscape architect  . Smokeless tobacco: Former Systems developer    Types: Chew  . Tobacco comment: QUIT SMOKING  MANY YEARS AGO"  Substance and Sexual Activity  . Alcohol use: No  . Drug use: No  . Sexual activity: Not on file  Lifestyle  . Physical activity:    Days per week: Not on file    Minutes per session: Not on file  . Stress: Not on file  Relationships  . Social connections:    Talks on phone: Not on file    Gets together: Not on file    Attends religious service: Not on file    Active member of club or organization: Not on file    Attends meetings of clubs or organizations: Not on file    Relationship status: Not on file  . Intimate partner violence:    Fear of current or ex partner: Not on file    Emotionally abused: Not on file    Physically abused: Not on file    Forced sexual activity: Not on file  Other Topics Concern  . Not on file  Social History Narrative   ** Merged History Encounter **       Four adopted children.  Lives alone.       Review of Systems  All other systems reviewed and are negative.      Objective:   Physical Exam  Constitutional: He appears well-developed and well-nourished.  Cardiovascular: Normal rate.  Murmur heard. Pulmonary/Chest: Effort normal. He has no wheezes. He has no rhonchi.  Abdominal: Soft. Bowel sounds are normal. He exhibits no distension. There is  no tenderness. There is no rebound.  Musculoskeletal: He exhibits no edema.  Vitals reviewed.         Assessment & Plan:  Chronic systolic congestive heart failure ([place]) - Plan: CBC with Differential/Platelet, COMPLETE METABOLIC PANEL WITH GFR, Hemoglobin A1c  PAF (paroxysmal atrial fibrillation) ([place]) - Plan: PT with INR/Fingerstick, PT with  INR/Fingerstick  Controlled type 2 diabetes mellitus without complication, without long-term current use of insulin ([place]) - Plan: CBC with Differential/Platelet, COMPLETE METABOLIC PANEL WITH GFR, Hemoglobin A1c  Patient's INR is supratherapeutic.  Decrease Coumadin to 2 mg on Monday and Friday and 4 mg on all other days.  Recheck INR in 4 weeks.  Patient's heart rate varies between 50 and 60 bpm.  Although bradycardic, he is asymptomatic.  Therefore I will make no changes in his rate control medication metoprolol.  Blood pressure today is well controlled at 118/60.  Regarding his diabetes, I will check a hemoglobin A1c.  Given his advanced age, anything below 7.5 is acceptable.  His last hemoglobin A1c was below 6.  Therefore I believe he is effectively diet-controlled.  I will also monitor his potassium and renal function closely.  It is difficult to manage accurately given the fact the patient "adjusts" his medication independently.  However if his potassium and renal function is stable, I would make no changes in his dose at this time.  There is no evidence of fluid overload or pulmonary edema.

## 2017-12-16 LAB — COMPLETE METABOLIC PANEL WITH GFR
AG Ratio: 0.9 (calc) — ABNORMAL LOW (ref 1.0–2.5)
ALBUMIN MSPROF: 3.2 g/dL — AB (ref 3.6–5.1)
ALKALINE PHOSPHATASE (APISO): 153 U/L — AB (ref 40–115)
ALT: 20 U/L (ref 9–46)
AST: 37 U/L — AB (ref 10–35)
BILIRUBIN TOTAL: 1 mg/dL (ref 0.2–1.2)
BUN / CREAT RATIO: 23 (calc) — AB (ref 6–22)
BUN: 29 mg/dL — AB (ref 7–25)
CHLORIDE: 97 mmol/L — AB (ref 98–110)
CO2: 25 mmol/L (ref 20–32)
CREATININE: 1.27 mg/dL — AB (ref 0.70–1.11)
Calcium: 8.8 mg/dL (ref 8.6–10.3)
GFR, Est African American: 59 mL/min/{1.73_m2} — ABNORMAL LOW (ref >=60)
GFR, Est Non African American: 51 mL/min/{1.73_m2} — ABNORMAL LOW (ref >=60)
GLOBULIN: 3.5 g/dL (ref 1.9–3.7)
Glucose, Bld: 146 mg/dL — ABNORMAL HIGH (ref 65–99)
Potassium: 4.4 mmol/L (ref 3.5–5.3)
SODIUM: 134 mmol/L — AB (ref 135–146)
Total Protein: 6.7 g/dL (ref 6.1–8.1)

## 2017-12-16 LAB — CBC WITH DIFFERENTIAL/PLATELET
BASOS ABS: 83 {cells}/uL (ref 0–200)
Basophils Relative: 1.3 %
EOS ABS: 269 {cells}/uL (ref 15–500)
Eosinophils Relative: 4.2 %
HEMATOCRIT: 43.8 % (ref 38.5–50.0)
HEMOGLOBIN: 14.9 g/dL (ref 13.2–17.1)
LYMPHS ABS: 2010 {cells}/uL (ref 850–3900)
MCH: 30.8 pg (ref 27.0–33.0)
MCHC: 34 g/dL (ref 32.0–36.0)
MCV: 90.7 fL (ref 80.0–100.0)
MPV: 10.8 fL (ref 7.5–12.5)
Monocytes Relative: 5.5 %
NEUTROS ABS: 3686 {cells}/uL (ref 1500–7800)
NEUTROS PCT: 57.6 %
Platelets: 133 10*3/uL — ABNORMAL LOW (ref 140–400)
RBC: 4.83 10*6/uL (ref 4.20–5.80)
RDW: 14.1 % (ref 11.0–15.0)
Total Lymphocyte: 31.4 %
WBC mixed population: 352 cells/uL (ref 200–950)
WBC: 6.4 10*3/uL (ref 3.8–10.8)

## 2017-12-16 LAB — HEMOGLOBIN A1C
EAG (MMOL/L): 7.7 (calc)
HEMOGLOBIN A1C: 6.5 %{Hb} — AB (ref <5.7)
MEAN PLASMA GLUCOSE: 140 (calc)

## 2018-01-01 ENCOUNTER — Emergency Department: Payer: Medicare Other

## 2018-01-01 ENCOUNTER — Observation Stay
Admission: EM | Admit: 2018-01-01 | Discharge: 2018-01-03 | Disposition: A | Discharge status: Home / Self Care | Payer: Medicare Other | Attending: Internal Medicine | Admitting: Internal Medicine

## 2018-01-01 ENCOUNTER — Other Ambulatory Visit: Payer: Self-pay

## 2018-01-01 DIAGNOSIS — Z8546 Personal history of malignant neoplasm of prostate: Secondary | ICD-10-CM | POA: Insufficient documentation

## 2018-01-01 DIAGNOSIS — E785 Hyperlipidemia, unspecified: Secondary | ICD-10-CM | POA: Diagnosis not present

## 2018-01-01 DIAGNOSIS — N183 Chronic kidney disease, stage 3 (moderate): Secondary | ICD-10-CM | POA: Diagnosis not present

## 2018-01-01 DIAGNOSIS — I252 Old myocardial infarction: Secondary | ICD-10-CM | POA: Insufficient documentation

## 2018-01-01 DIAGNOSIS — Z955 Presence of coronary angioplasty implant and graft: Secondary | ICD-10-CM | POA: Insufficient documentation

## 2018-01-01 DIAGNOSIS — N179 Acute kidney failure, unspecified: Secondary | ICD-10-CM | POA: Diagnosis not present

## 2018-01-01 DIAGNOSIS — R110 Nausea: Secondary | ICD-10-CM | POA: Diagnosis not present

## 2018-01-01 DIAGNOSIS — Z79899 Other long term (current) drug therapy: Secondary | ICD-10-CM | POA: Diagnosis not present

## 2018-01-01 DIAGNOSIS — I959 Hypotension, unspecified: Secondary | ICD-10-CM | POA: Diagnosis not present

## 2018-01-01 DIAGNOSIS — R1111 Vomiting without nausea: Secondary | ICD-10-CM | POA: Diagnosis not present

## 2018-01-01 DIAGNOSIS — I130 Hypertensive heart and chronic kidney disease with heart failure and stage 1 through stage 4 chronic kidney disease, or unspecified chronic kidney disease: Secondary | ICD-10-CM | POA: Insufficient documentation

## 2018-01-01 DIAGNOSIS — M109 Gout, unspecified: Secondary | ICD-10-CM | POA: Diagnosis not present

## 2018-01-01 DIAGNOSIS — Z66 Do not resuscitate: Secondary | ICD-10-CM | POA: Insufficient documentation

## 2018-01-01 DIAGNOSIS — R748 Abnormal levels of other serum enzymes: Secondary | ICD-10-CM | POA: Insufficient documentation

## 2018-01-01 DIAGNOSIS — I5032 Chronic diastolic (congestive) heart failure: Secondary | ICD-10-CM | POA: Insufficient documentation

## 2018-01-01 DIAGNOSIS — I447 Left bundle-branch block, unspecified: Secondary | ICD-10-CM | POA: Diagnosis not present

## 2018-01-01 DIAGNOSIS — Z8249 Family history of ischemic heart disease and other diseases of the circulatory system: Secondary | ICD-10-CM | POA: Diagnosis not present

## 2018-01-01 DIAGNOSIS — K5900 Constipation, unspecified: Secondary | ICD-10-CM | POA: Diagnosis not present

## 2018-01-01 DIAGNOSIS — K759 Inflammatory liver disease, unspecified: Secondary | ICD-10-CM | POA: Insufficient documentation

## 2018-01-01 DIAGNOSIS — R778 Other specified abnormalities of plasma proteins: Secondary | ICD-10-CM

## 2018-01-01 DIAGNOSIS — R61 Generalized hyperhidrosis: Secondary | ICD-10-CM | POA: Diagnosis not present

## 2018-01-01 DIAGNOSIS — Z7901 Long term (current) use of anticoagulants: Secondary | ICD-10-CM | POA: Diagnosis not present

## 2018-01-01 DIAGNOSIS — I480 Paroxysmal atrial fibrillation: Secondary | ICD-10-CM | POA: Diagnosis not present

## 2018-01-01 DIAGNOSIS — K861 Other chronic pancreatitis: Secondary | ICD-10-CM | POA: Insufficient documentation

## 2018-01-01 DIAGNOSIS — E1122 Type 2 diabetes mellitus with diabetic chronic kidney disease: Secondary | ICD-10-CM | POA: Insufficient documentation

## 2018-01-01 DIAGNOSIS — I2510 Atherosclerotic heart disease of native coronary artery without angina pectoris: Secondary | ICD-10-CM | POA: Diagnosis not present

## 2018-01-01 DIAGNOSIS — I248 Other forms of acute ischemic heart disease: Secondary | ICD-10-CM | POA: Diagnosis not present

## 2018-01-01 DIAGNOSIS — R7989 Other specified abnormal findings of blood chemistry: Secondary | ICD-10-CM

## 2018-01-01 DIAGNOSIS — K5909 Other constipation: Secondary | ICD-10-CM | POA: Insufficient documentation

## 2018-01-01 HISTORY — DX: Chronic kidney disease, stage 3 unspecified: N18.30

## 2018-01-01 HISTORY — DX: Chronic kidney disease, stage 3 (moderate): N18.3

## 2018-01-01 HISTORY — DX: Non-pressure chronic ulcer of skin of other sites with unspecified severity: L98.499

## 2018-01-01 HISTORY — DX: Disorder of kidney and ureter, unspecified: N28.9

## 2018-01-01 LAB — CBC WITH DIFFERENTIAL/PLATELET
Basophils Absolute: 0.1 10*3/uL (ref 0–0.1)
Basophils Relative: 1 %
Eosinophils Absolute: 0.2 10*3/uL (ref 0–0.7)
Eosinophils Relative: 2 %
HEMATOCRIT: 46.8 % (ref 40.0–52.0)
HEMOGLOBIN: 15.7 g/dL (ref 13.0–18.0)
LYMPHS ABS: 1.3 10*3/uL (ref 1.0–3.6)
Lymphocytes Relative: 15 %
MCH: 31.7 pg (ref 26.0–34.0)
MCHC: 33.4 g/dL (ref 32.0–36.0)
MCV: 94.8 fL (ref 80.0–100.0)
MONOS PCT: 6 %
Monocytes Absolute: 0.5 10*3/uL (ref 0.2–1.0)
NEUTROS ABS: 6.3 10*3/uL (ref 1.4–6.5)
NEUTROS PCT: 76 %
Platelets: 165 10*3/uL (ref 150–440)
RBC: 4.94 MIL/uL (ref 4.40–5.90)
RDW: 16.7 % — ABNORMAL HIGH (ref 11.5–14.5)
WBC: 8.4 10*3/uL (ref 3.8–10.6)

## 2018-01-01 LAB — COMPREHENSIVE METABOLIC PANEL
ALBUMIN: 3.2 g/dL — AB (ref 3.5–5.0)
ALT: 28 U/L (ref 17–63)
AST: 56 U/L — AB (ref 15–41)
Alkaline Phosphatase: 126 U/L (ref 38–126)
Anion gap: 11 (ref 5–15)
BILIRUBIN TOTAL: 1.3 mg/dL — AB (ref 0.3–1.2)
BUN: 43 mg/dL — AB (ref 6–20)
CHLORIDE: 102 mmol/L (ref 101–111)
CO2: 22 mmol/L (ref 22–32)
Calcium: 9.2 mg/dL (ref 8.9–10.3)
Creatinine, Ser: 1.62 mg/dL — ABNORMAL HIGH (ref 0.61–1.24)
GFR calc Af Amer: 43 mL/min — ABNORMAL LOW (ref >60)
GFR calc non Af Amer: 37 mL/min — ABNORMAL LOW (ref >60)
GLUCOSE: 166 mg/dL — AB (ref 65–99)
POTASSIUM: 4.3 mmol/L (ref 3.5–5.1)
Sodium: 135 mmol/L (ref 135–145)
TOTAL PROTEIN: 7.6 g/dL (ref 6.5–8.1)

## 2018-01-01 LAB — TROPONIN I: Troponin I: 0.26 ng/mL (ref <0.03)

## 2018-01-01 LAB — LIPASE, BLOOD: Lipase: 64 U/L — ABNORMAL HIGH (ref 11–51)

## 2018-01-01 NOTE — ED Triage Notes (Signed)
Pt arrived from home via [facility] with complaints of constipation and weakness since this morning. Pt wife called EMS because he said his "bowels were blocked". PT has a Hx of heart attack 2 years ago. PT took some castor oil and states that he vomited that back up. Pt has become more lethargic over the past 2 hours. VS per EMS BP-114/82 BS-152. Pt has a 20 in left hand placed by EMS.

## 2018-01-01 NOTE — ED Notes (Signed)
Date and time results received: 01/01/18 2330 (use smartphrase ".now" to insert current time)  Test:Troponin Critical Value: 0.26  Name of Provider Notified: Dr. Cinda Quest   Orders Received? Or Actions Taken?:

## 2018-01-01 NOTE — ED Provider Notes (Addendum)
[facility] Emergency Department Provider Note   ____________________________________________   First MD Initiated Contact with Patient 01/01/18 2239     (approximate)  I have reviewed the triage vital signs and the nursing notes.   HISTORY  Chief Complaint Constipation and Weakness    HPI [name] is a 84 y.o. male Who complains of inability to stool and some weight loss over the last week or so. He lost a pound in the last day and maybe 10 pounds in the last week or so. Today he was straining very hard and couldn't stool and took some [place] and orange juice but vomited back up. When he came to the emergency room he had a hard ball of stool halfway out of his rectum. I disimpacted that and several other pieces of stool and then he had some diarrhea afterwards. We will continue his evaluation. He is not currently having abdominal pain anymore or chest pain or shortness of breath.I could not get him to tell me when the last time he stooled was.  Past Medical History:  Diagnosis Date  . Atelectasis of right lung   . Chronic diastolic (congestive) heart failure ([place])    a. 10/2013 Echo: EF 55-60%, basal-mid anteroseptal and basal-mid inferoseptal HK.  . Chronic fatigue   . Chronic subdural hematoma ([place])    a. 12/2015 Head CT: chronic right holo-hemispheric SDH w/o midline shift.  . Coronary artery disease    a. 11/2014 NSTEMI: DESx 2 placed to LAD and RCA; b. 08/2015 Cath: LAD patent stent, LCX 43m, RCA patent stent, 30m.  . Diabetes mellitus, type II ([place])    a. HgA1c 6.8 in 03/2015  . ED (erectile dysfunction)   . Gout   . Hemangioma of liver 08/02/2015   12 mm enhancing lesion seen on CT - suspicious for benign hemangioma but not diagnostic - MRI recommended  . HLD (hyperlipidemia)   . Hypertensive heart disease   . Kidney stones   . Lower extremity edema    a. chronic  . OA (osteoarthritis)   . Orthostatic hypotension   . PAF (paroxysmal  atrial fibrillation) ([place])    a. 10/2015 s/p DCCV;  b. CHA2DS2VASc = 6--> on coumadin; c. 10/2015 Echo: sev dil LA.  Marland Kitchen Physical deconditioning   . Prostate cancer ([place])   . Pulmonary HTN ([place])   . Pulmonary hypertension ([place])    a. 10/2015 Echo: PASP 11mmHg.  Marland Kitchen Severe mitral regurgitation    a.  10/2015 s/p minimally invasive MV repair; b. 10/2015 Echo: mod MS, mean grad 73mmHg.  Marland Kitchen Spinal stenosis   . SVT (supraventricular tachycardia) ([facility])    a. recurrent, usually responsive to vagal manuevers  . Tricuspid regurgitation    a. 10/2015 Echo: mild to mod TR.    Patient Active Problem List   Diagnosis Date Noted  . Finger ulcer, limited to breakdown of skin ([place]) 03/12/2017  . Closed fracture of lower end of right ulna 11/25/2016  . SDH (subdural hematoma) ([place]) 11/07/2016  . Subdural hematoma ([place]) 11/07/2016  . Chronic diastolic CHF (congestive heart failure) ([place]) 07/03/2016  . Gallstone pancreatitis 07/03/2016  . Hypertensive heart disease   . Syncope 12/11/2015  . Orthostatic hypotension   . Weakness 12/09/2015  . PAF (paroxysmal atrial fibrillation) ([place])   . CAD (coronary artery disease)   . Pressure ulcer 10/19/2015  . Long term (current) use of anticoagulants [Z79.01] 10/17/2015  . S/P minimally invasive mitral valve repair 10/04/2015  .  Physical deconditioning   . Hemangioma of liver 08/02/2015  . Diabetes mellitus, type II ([place])   . Coronary artery disease   . Pulmonary HTN ([place])   . Lower extremity edema   . History of PSVT (paroxysmal supraventricular tachycardia) 11/15/2014  . Dyslipidemia 11/15/2014  . History of prostate cancer 11/15/2014  . Dry eye 09/03/2013  . TBI (traumatic brain injury) ([place]) 11/25/2012  . Orbit fracture ([place]) 11/25/2012  . Maxillary sinus fracture ([place]) 11/25/2012  . Frequent falls 11/25/2012  . OA (osteoarthritis)   . Metabolic syndrome   . ED (erectile dysfunction)   . Gout   . Spinal stenosis   . Hypertension 05/07/2012    Past  Surgical History:  Procedure Laterality Date  . BACK SURGERY    . Back surgery x 2    . CARDIAC CATHETERIZATION N/A 08/08/2015   Procedure: Right/Left Heart Cath and Coronary Angiography;  Surgeon: Sherren Mocha, MD;  Location: [place] CV LAB;  Service: Cardiovascular;  Laterality: N/A;  . CARDIAC SURGERY     2 cardiac stents  . CARDIOVERSION N/A 10/20/2015   Procedure: CARDIOVERSION;  Surgeon: Pixie Casino, MD;  Location: [place];  Service: Cardiovascular;  Laterality: N/A;  . CERVICAL SPINE SURGERY    . CHOLECYSTECTOMY N/A 07/08/2016   Procedure: LAPAROSCOPIC CHOLECYSTECTOMY WITH INTRAOPERATIVE CHOLANGIOGRAM;  Surgeon: Georganna Skeans, MD;  Location: [place];  Service: General;  Laterality: N/A;  . EYE SURGERY    . INTRAOPERATIVE TRANSESOPHAGEAL ECHOCARDIOGRAM  10/05/2015   Procedure: INTRAOPERATIVE TRANSESOPHAGEAL ECHOCARDIOGRAM;  Surgeon: Rexene Alberts, MD;  [facility];  Service: Open Heart Surgery;;  . JOINT REPLACEMENT     shoulder  . LEFT HEART CATHETERIZATION WITH CORONARY ANGIOGRAM N/A 11/16/2014   Procedure: LEFT HEART CATHETERIZATION WITH CORONARY ANGIOGRAM;  Surgeon: Troy Sine, MD;  Location: [facility] CATH LAB;  Service: Cardiovascular;  Laterality: N/A;  . MITRAL VALVE REPAIR Right 10/04/2015   Procedure: MINIMALLY INVASIVE MITRAL VALVE REPAIR (MVR);  Surgeon: Rexene Alberts, MD;  Location: [place];  Service: Open Heart Surgery;  Laterality: Right;  . Rt rotator cuff repair    . TEE WITHOUT CARDIOVERSION N/A 06/22/2015   Procedure: TRANSESOPHAGEAL ECHOCARDIOGRAM (TEE);  Surgeon: Skeet Latch, MD;  Location: [place];  Service: Cardiovascular;  Laterality: N/A;  . TEE WITHOUT CARDIOVERSION N/A 10/04/2015   Procedure: TRANSESOPHAGEAL ECHOCARDIOGRAM (TEE);  Surgeon: Rexene Alberts, MD;  Location: [place];  Service: Open Heart Surgery;  Laterality: N/A;  . WOUND EXPLORATION N/A 10/05/2015   Procedure: Sternal Incision;  Surgeon: Rexene Alberts, MD;  Location: [place];   Service: Open Heart Surgery;  Laterality: N/A;    Prior to Admission medications   Medication Sig Start Date End Date Taking? Authorizing Provider  allopurinol (ZYLOPRIM) 100 MG tablet TAKE ONE (1) TABLET EACH DAY 02/17/17   [name], MD  colchicine 0.6 MG tablet Take 1 tablet (0.6 mg total) by mouth daily. 09/04/16   [name], MD  finasteride (PROSCAR) 5 MG tablet TAKE ONE (1) TABLET BY MOUTH EVERY DAY 10/20/17   [name], MD  furosemide (LASIX) 40 MG tablet Take 2 tablets (80 mg total) by mouth 2 (two) times daily. 11/10/17   [name], MD  magnesium oxide (MAG-OX) 400 MG tablet Take 1 tablet (400 mg total) by mouth daily. 07/09/16   Florencia Reasons, MD  metoprolol tartrate (LOPRESSOR) 25 MG tablet TAKE ONE-HALF TABLET BY MOUTH TWICE DAILY 09/19/17   [name], MD  OVER THE COUNTER MEDICATION [place]: Take 1 capsule by mouth once a day for immune health    [provider]  potassium chloride (KLOR-CON) 20 MEQ packet Take 40 mEq by mouth daily. 05/22/17   [name], MD  pravastatin (PRAVACHOL) 40 MG tablet TAKE ONE (1) TABLET EACH DAY AT 6 P.M. 08/12/17   [name], MD  spironolactone (ALDACTONE) 25 MG tablet Take 1 tablet (25 mg total) by mouth daily. 11/28/17   [name], MD  traMADol (ULTRAM) 50 MG tablet Take 1 tablet (50 mg total) by mouth every 6 (six) hours as needed for moderate pain. 11/11/16   Meuth, Brooke A, PA-C  warfarin (COUMADIN) 4 MG tablet Take 1 tablet (4 mg total) by mouth daily. Do not take with any other warfarin unless Dr. Dennard Schaumann instructs. 11/07/17   [name], MD  Wheat Dextrin (BENEFIBER) POWD Take by mouth See admin instructions. Mix one teaspoonful into 6-8 ounces of fluid/milk and drink once daily    [provider]    Allergies Patient has no known allergies.  Family History  Problem Relation Age of Onset  . Heart failure Mother 57  . Heart attack Mother   . Heart attack Brother      Social History Social History   Tobacco Use  . Smoking status: Former Smoker    Types: Cigarettes, Pipe, Landscape architect  . Smokeless tobacco: Former Systems developer    Types: Chew  . Tobacco comment: QUIT SMOKING  MANY YEARS AGO"  Substance Use Topics  . Alcohol use: No  . Drug use: No    Review of Systems  Constitutional: No fever/chills Eyes: No visual changes. ENT: No sore throat. Cardiovascular: Denies chest pain. Respiratory: Denies shortness of breath. Gastrointestinal: No abdominal pain.  No nausea, no vomiting.  No diarrhea.   constipation. Genitourinary: Negative for dysuria. Musculoskeletal: Negative for back pain. Skin: Negative for rash. Neurological: Negative for headaches, focal weakness   ____________________________________________   PHYSICAL EXAM:  VITAL SIGNS: ED Triage Vitals  Enc Vitals Group     BP 01/01/18 2235 137/77     Pulse Rate 01/01/18 2235 77     Resp 01/01/18 2235 18     Temp 01/01/18 2235 97.6 F (36.4 C)     Temp Source 01/01/18 2235 Oral     SpO2 01/01/18 2235 96 %     Weight 01/01/18 2236 191 lb (86.6 kg)     Height 01/01/18 2236 5\' 7"  (1.702 m)     Head Circumference --      Peak Flow --      Pain Score 01/01/18 2235 10     Pain Loc --      Pain Edu? --      Excl. in [place]? --     Constitutional: Alert and oriented. Well appearing and in no acute distress. Eyes: Conjunctivae are normal. PERRL. EOMI. Head: Atraumatic. Nose: No congestion/rhinnorhea. Mouth/Throat: Mucous membranes are moist.  Oropharynx non-erythematous. Neck: No stridor.  Cardiovascular: Normal rate, regular rhythm. Grossly normal heart sounds.  Good peripheral circulation. Respiratory: Normal respiratory effort.  No retractions. Lungs CTAB. Gastrointestinal: Soft and nontender. No distention. No abdominal bruits. No CVA tenderness. Musculoskeletal: No lower extremity tenderness nor edema.  No joint effusions. rectal: Please see history of present illness balls of stool  removed. Some blood came out as I was removing the stool. I think this is just from straining. I did not feel any masses or tears. There were no  hemorrhoids. Neurologic:  Normal speech and language. No gross focal neurologic deficits are appreciated. No gait instability. Skin:  Skin is warm, dry and intact. No rash noted. Psychiatric: Mood and affect are normal. Speech and behavior are normal.  ____________________________________________   LABS (all labs ordered are listed, but only abnormal results are displayed)  Labs Reviewed  COMPREHENSIVE METABOLIC PANEL - Abnormal; Notable for the following components:      Result Value   Glucose, Bld 166 (*)    BUN 43 (*)    Creatinine, Ser 1.62 (*)    Albumin 3.2 (*)    AST 56 (*)    Total Bilirubin 1.3 (*)    GFR calc non Af Amer 37 (*)    GFR calc Af Amer 43 (*)    All other components within normal limits  LIPASE, BLOOD - Abnormal; Notable for the following components:   Lipase 64 (*)    All other components within normal limits  TROPONIN I - Abnormal; Notable for the following components:   Troponin I 0.26 (*)    All other components within normal limits  CBC WITH DIFFERENTIAL/PLATELET - Abnormal; Notable for the following components:   RDW 16.7 (*)    All other components within normal limits  PROTIME-INR  PROTIME-INR   ____________________________________________  EKG  EKG read and interpreted by me shows normal sinus rhythm rate of 67 left axis left bundle-branch block no acute changes ____________________________________________  RADIOLOGY  ED MD interpretation: x-rays reviewed by me shows small right pleural effusion and small right lower lobe opacity radiologist feels this is likely atelectasis. There is no bowel obstruction any other bowel obstruction or free air  Official radiology report(s): Dg Abdomen Acute W/chest  Result Date: 01/01/2018 CLINICAL DATA:  Constipation, weakness, cough EXAM: DG ABDOMEN ACUTE W/ 1V  CHEST COMPARISON:  11/10/2016 FINDINGS: Small right pleural effusion. Associated right lower lobe opacity, likely atelectasis. Left lung is clear. No pneumothorax. Stable right rib fractures, likely involving the right posterolateral 3rd through 7th ribs. Nonobstructive bowel gas pattern. No evidence of free air under the diaphragm on the upright view. Cholecystectomy clips. Degenerative changes of the thoracolumbar spine. IMPRESSION: Small right pleural effusion. Associated right lower lobe opacity, likely atelectasis. Stable right rib fractures.  No pneumothorax. No evidence of small bowel obstruction or free air. Electronically Signed   By: Julian Hy M.D.   On: 01/01/2018 23:48    ____________________________________________   PROCEDURES  Procedure(s) performed:   Procedures  Critical Care performed:   ____________________________________________   INITIAL IMPRESSION / ASSESSMENT AND PLAN / ED COURSE patient has been feeling ill. His troponin is elevated at 0.26 his GFR has gone from 51 to 36 his minimal elevation in his lipase as well. He was constipated had a little bit of bleeding when he was disimpacted. Looks like you will have to come in for further evaluation of his elevated troponin and is acute kidney injury.       ____________________________________________   FINAL CLINICAL IMPRESSION(S) / ED DIAGNOSES  Final diagnoses:  Constipation, unspecified constipation type  Elevated troponin  AKI (acute kidney injury) [facility])     ED Discharge Orders    None       Note:  This document was prepared using Dragon voice recognition software and may include unintentional dictation errors.    Nena Polio, MD 01/01/18 1962    Nena Polio, MD 01/02/18 [phone]

## 2018-01-01 NOTE — ED Notes (Signed)
Dr. Cinda Quest at bedside

## 2018-01-02 ENCOUNTER — Other Ambulatory Visit: Payer: Self-pay

## 2018-01-02 ENCOUNTER — Observation Stay: Payer: Medicare Other

## 2018-01-02 ENCOUNTER — Encounter: Payer: Self-pay | Admitting: *Deleted

## 2018-01-02 DIAGNOSIS — K759 Inflammatory liver disease, unspecified: Secondary | ICD-10-CM | POA: Diagnosis not present

## 2018-01-02 DIAGNOSIS — N179 Acute kidney failure, unspecified: Secondary | ICD-10-CM | POA: Diagnosis not present

## 2018-01-02 DIAGNOSIS — K859 Acute pancreatitis without necrosis or infection, unspecified: Secondary | ICD-10-CM | POA: Diagnosis not present

## 2018-01-02 DIAGNOSIS — R748 Abnormal levels of other serum enzymes: Secondary | ICD-10-CM | POA: Diagnosis not present

## 2018-01-02 DIAGNOSIS — K7460 Unspecified cirrhosis of liver: Secondary | ICD-10-CM | POA: Diagnosis not present

## 2018-01-02 DIAGNOSIS — I248 Other forms of acute ischemic heart disease: Secondary | ICD-10-CM

## 2018-01-02 LAB — PROTIME-INR
INR: 2.61
INR: 2.78
PROTHROMBIN TIME: 29.1 s — AB (ref 11.4–15.2)
Prothrombin Time: 27.7 seconds — ABNORMAL HIGH (ref 11.4–15.2)

## 2018-01-02 LAB — TROPONIN I
Troponin I: 0.39 ng/mL (ref <0.03)
Troponin I: 0.49 ng/mL (ref <0.03)
Troponin I: 0.58 ng/mL (ref <0.03)

## 2018-01-02 LAB — GAMMA GT: GGT: 90 U/L — ABNORMAL HIGH (ref 7–50)

## 2018-01-02 LAB — TSH: TSH: 0.455 u[IU]/mL (ref 0.350–4.500)

## 2018-01-02 MED ORDER — SPIRONOLACTONE 25 MG PO TABS
25.0000 mg | ORAL_TABLET | Freq: Every day | ORAL | Status: DC
  Administered 2018-01-03: 25 mg via ORAL
  Filled 2018-01-02: qty 1

## 2018-01-02 MED ORDER — WARFARIN SODIUM 2 MG PO TABS
2.0000 mg | ORAL_TABLET | ORAL | Status: DC
  Administered 2018-01-02: 2 mg via ORAL
  Filled 2018-01-02: qty 1

## 2018-01-02 MED ORDER — ONDANSETRON HCL 4 MG/2ML IJ SOLN: 4.0000 mg | Freq: Four times a day (QID) | INTRAMUSCULAR | Status: DC | PRN

## 2018-01-02 MED ORDER — FUROSEMIDE 10 MG/ML IJ SOLN: 20.0000 mg | Freq: Two times a day (BID) | INTRAMUSCULAR | Status: DC

## 2018-01-02 MED ORDER — ONDANSETRON HCL 4 MG PO TABS: 4.0000 mg | ORAL_TABLET | Freq: Four times a day (QID) | ORAL | Status: DC | PRN

## 2018-01-02 MED ORDER — WARFARIN - PHARMACIST DOSING INPATIENT
Freq: Every day | Status: DC
  Administered 2018-01-02: 17:00:00

## 2018-01-02 MED ORDER — METOPROLOL TARTRATE 25 MG PO TABS
12.5000 mg | ORAL_TABLET | Freq: Two times a day (BID) | ORAL | Status: DC
  Administered 2018-01-02 – 2018-01-03 (×2): 12.5 mg via ORAL
  Filled 2018-01-02 (×3): qty 1

## 2018-01-02 MED ORDER — HEPARIN SODIUM (PORCINE) 5000 UNIT/ML IJ SOLN
5000.0000 [IU] | Freq: Three times a day (TID) | INTRAMUSCULAR | Status: DC
  Administered 2018-01-02: 5000 [IU] via SUBCUTANEOUS
  Filled 2018-01-02: qty 1

## 2018-01-02 MED ORDER — ALLOPURINOL 100 MG PO TABS
100.0000 mg | ORAL_TABLET | Freq: Every day | ORAL | Status: DC
  Administered 2018-01-02 – 2018-01-03 (×2): 100 mg via ORAL
  Filled 2018-01-02 (×2): qty 1

## 2018-01-02 MED ORDER — DOCUSATE SODIUM 100 MG PO CAPS
100.0000 mg | ORAL_CAPSULE | Freq: Two times a day (BID) | ORAL | Status: DC
  Administered 2018-01-02 – 2018-01-03 (×3): 100 mg via ORAL
  Filled 2018-01-02 (×3): qty 1

## 2018-01-02 MED ORDER — LACTULOSE 10 GM/15ML PO SOLN
20.0000 g | Freq: Two times a day (BID) | ORAL | Status: DC
  Administered 2018-01-02 (×2): 20 g via ORAL
  Filled 2018-01-02 (×3): qty 30

## 2018-01-02 MED ORDER — WARFARIN SODIUM 4 MG PO TABS: 4.0000 mg | ORAL_TABLET | ORAL | Status: DC

## 2018-01-02 MED ORDER — IOPAMIDOL (ISOVUE-300) INJECTION 61%
75.0000 mL | Freq: Once | INTRAVENOUS | Status: AC | PRN
  Administered 2018-01-02: 75 mL via INTRAVENOUS

## 2018-01-02 MED ORDER — SODIUM CHLORIDE 0.9 % IV SOLN
INTRAVENOUS | Status: DC
  Administered 2018-01-02 – 2018-01-03 (×3): via INTRAVENOUS

## 2018-01-02 MED ORDER — FINASTERIDE 5 MG PO TABS
5.0000 mg | ORAL_TABLET | Freq: Every day | ORAL | Status: DC
  Administered 2018-01-02 – 2018-01-03 (×2): 5 mg via ORAL
  Filled 2018-01-02 (×2): qty 1

## 2018-01-02 MED ORDER — ADULT MULTIVITAMIN W/MINERALS CH
1.0000 | ORAL_TABLET | Freq: Every day | ORAL | Status: DC
  Administered 2018-01-02 – 2018-01-03 (×2): 1 via ORAL
  Filled 2018-01-02 (×2): qty 1

## 2018-01-02 MED ORDER — ACETAMINOPHEN 325 MG PO TABS: 650.0000 mg | ORAL_TABLET | Freq: Four times a day (QID) | ORAL | Status: DC | PRN

## 2018-01-02 MED ORDER — ACETAMINOPHEN 650 MG RE SUPP: 650.0000 mg | Freq: Four times a day (QID) | RECTAL | Status: DC | PRN

## 2018-01-02 NOTE — Care Management Obs Status (Signed)
[place] NOTIFICATION   Patient Details  Name: [name] MRN: [id number] Date of Birth: [birth year]   Medicare Observation Status Notification Given:  Yes    Katrina Stack, RN 01/02/2018, 8:30 AM

## 2018-01-02 NOTE — Care Management (Signed)
Placed in observation for acute kidney injury. Receiving IVF. Mildly elevated lipase. Cardiology consult pending for elevated troponins

## 2018-01-02 NOTE — Progress Notes (Signed)
This is an 84 year old male admitted for kidney failure. 1.  Kidney failure: Acute on chronic kidney injury; prerenal.  Avoid nephrotoxic agents.  Gently hydrate with intravenous fluid. 2.  Elevated troponin: Secondary to prolonged straining due to constipation as well as poor renal clearance.  Continue to follow cardiac biomarkers.  Monitor telemetry.  Consult cardiology. 3.  Pancreatitis: Recurrent although the patient does not endorse epigastric pain at this time.  Previously the patient had gallstone pancreatitis and is now post cholecystectomy.  I have ordered a CT of his abdomen to visualize intrahepatic ducts.  No signs or symptoms of sepsis. 4.  Hepatitis: Also recurrent; concomitant with cirrhosis.  Viral etiologies negative in the past.  Unclear if the patient is drinking.  Check ethanol level.  Check GGT to rule out intrahepatic bile duct inflammation/malignancy.  The patient has mild ascites as well but does not have symptoms of SBP at this time.  Consider antibiotic prophylaxis 5.  Constipation: Start lactulose.  Check ammonia level.  This will help with bowel regularity as well as ammonia level if elevated. 6.  DVT prophylaxis: Heparin 7.  GI prophylaxis: None The patient is a full code.  Time spent on admission orders and patient care approximately 45 minutes  Agree with above

## 2018-01-02 NOTE — Progress Notes (Signed)
Initial Nutrition Assessment  DOCUMENTATION CODES:   Not applicable  INTERVENTION:  Monitor for diet advancement  Magic cup TID with diet advancement, each supplement provides 290 kcal and 9 grams of protein  MVI w/ mienrals  NUTRITION DIAGNOSIS:   Inadequate oral intake related to acute illness as evidenced by per patient/family report, other (comment)(on CLD diet).  GOAL:   Patient will meet greater than or equal to 90% of their needs  MONITOR:   PO intake, Diet advancement, Supplement acceptance  REASON FOR ASSESSMENT:   Malnutrition Screening Tool    ASSESSMENT:   [name] has a PMH of CAD s/p MI and stent replacement, CHF, mitral and tricuspid regurgitation, chronic subdural hematoma presents with complaints of constipation and weakness. He has been straining to have bowel movements for at least one week, now has abd distension and pain. Denies nausea/vomiting. Also exhibits AKI and pancreatitis.  RD drawn to patient for positive MST. Spoke with patient and wife at bedside.  Wife states she would cook for patient for the past week and he would eat 2-3 bites before she would throw it away. He has had very little appetite. Prior to this, he was eating 2-3 meals per day with snacks. They report he went from 238 pounds to 194 pounds over the past year. Per chart it appears he was 194 pounds 03/2017, this weight trended upwards to 229 pounds 08/18/2017, and began to trend back downwards from there to 194 pounds again upon this admission. He was 207 pounds 10/28/2017, 205 pounds 12/15/2017, and now 194 pounds, indicating an 11 pound/5.4% severe weight loss for timeframe, likely related to constipation, weakness, and poor PO intake.  Patient had clear liquids during RD visit, was coming 100% along with contrast for abdominal CT. Will continue to monitor PO intake; patient states he feels much better compared to when he was admitted, appetite appears to be improving.  Labs  reviewed Medications reviewed and include:  Lactulose, Colace NS at 29mL/hr  NUTRITION - FOCUSED PHYSICAL EXAM:    Most Recent Value  Orbital Region  Moderate depletion  Upper Arm Region  No depletion  Thoracic and Lumbar Region  No depletion  Buccal Region  Moderate depletion  Temple Region  Moderate depletion  Clavicle Bone Region  No depletion  Clavicle and Acromion Bone Region  No depletion  Scapular Bone Region  No depletion  Dorsal Hand  No depletion  Patellar Region  Mild depletion  Anterior Thigh Region  Mild depletion  Posterior Calf Region  Mild depletion  Edema (RD Assessment)  Mild       Diet Order:   Diet Order           Diet clear liquid Room service appropriate? Yes; Fluid consistency: Thin  Diet effective now          EDUCATION NEEDS:   Not appropriate for education at this time  Skin:  Skin Assessment: Reviewed RN Assessment  Last BM:  01/01/2018  Height:   Ht Readings from Last 1 Encounters:  01/02/18 5\' 7"  (1.702 m)    Weight:   Wt Readings from Last 1 Encounters:  01/02/18 194 lb 3.2 oz (88.1 kg)    Ideal Body Weight:  67.27 kg  BMI:  Body mass index is 30.42 kg/m.  Estimated Nutritional Needs:   Kcal:  2000-2200 calories  Protein:  105-123 grams  Fluid:  >2L  Satira Anis. Evann Erazo, MS, RD LDN Inpatient Clinical Dietitian Pager [phone]

## 2018-01-02 NOTE — Plan of Care (Signed)
  Problem: Education: Goal: Knowledge of General Education information will improve Outcome: Progressing   Problem: Health Behavior/Discharge Planning: Goal: Ability to manage health-related needs will improve Outcome: Progressing   Problem: Clinical Measurements: Goal: Ability to maintain clinical measurements within normal limits will improve Outcome: Progressing Goal: Will remain free from infection Outcome: Progressing Goal: Respiratory complications will improve Outcome: Progressing   Problem: Activity: Goal: Risk for activity intolerance will decrease Outcome: Progressing   Problem: Nutrition: Goal: Adequate nutrition will be maintained Outcome: Progressing   Problem: Elimination: Goal: Will not experience complications related to bowel motility Outcome: Progressing Goal: Will not experience complications related to urinary retention Outcome: Progressing   Problem: Pain Managment: Goal: General experience of comfort will improve Outcome: Progressing   Problem: Safety: Goal: Ability to remain free from injury will improve Outcome: Progressing   Problem: Skin Integrity: Goal: Risk for impaired skin integrity will decrease Outcome: Progressing   Pt admitted for positive troponin and acute kidney injury, IV fluids started, troponins are trending up, cardiology consult pending, no complaints of pain.

## 2018-01-02 NOTE — H&P (Signed)
[name] is an 84 y.o. male.   Chief Complaint: Constipation HPI: The patient with extensive past medical history most significant for coronary artery disease status post MI and and stent placement, chronic diastolic heart failure as well as significant mitral and tricuspid regurg that is post minimally invasive repair, and chronic subdural hematoma presents to the emergency department complaining of constipation and weakness.  The patient has been straining to have bowel movements for at least a week and now has abdominal distention and pain.  He denies nausea and vomiting.  He admits to decreased appetite and weight loss.  He denies fevers or chills.  In the emergency department the patient was disimpacted much to his relief.  Laboratory evaluation revealed acute kidney injury as well as elevated troponin, hepatitis and pancreatitis prompted the emergency department staff to call the hospitalist service for admission.  Past Medical History:  Diagnosis Date  . Atelectasis of right lung   . Chronic diastolic (congestive) heart failure ([place])    a. 10/2013 Echo: EF 55-60%, basal-mid anteroseptal and basal-mid inferoseptal HK.  . Chronic fatigue   . Chronic subdural hematoma ([place])    a. 12/2015 Head CT: chronic right holo-hemispheric SDH w/o midline shift.  . Coronary artery disease    a. 11/2014 NSTEMI: DESx 2 placed to LAD and RCA; b. 08/2015 Cath: LAD patent stent, LCX 47m RCA patent stent, 2107m . Diabetes mellitus, type II ([place]   a. HgA1c 6.8 in 03/2015  . ED (erectile dysfunction)   . Gout   . Hemangioma of liver 08/02/2015   12 mm enhancing lesion seen on CT - suspicious for benign hemangioma but not diagnostic - MRI recommended  . HLD (hyperlipidemia)   . Hypertensive heart disease   . Kidney stones   . Lower extremity edema    a. chronic  . OA (osteoarthritis)   . Orthostatic hypotension   . PAF (paroxysmal atrial fibrillation) ([place]   a. 10/2015 s/p DCCV;  b. CHA2DS2VASc =  6--> on coumadin; c. 10/2015 Echo: sev dil LA.  . Marland Kitchenhysical deconditioning   . [place]state cancer ([place]  . Pulmonary HTN ([place]  . Pulmonary hypertension ([facility]   a. 10/2015 Echo: PASP 4771m.  . SMarland Kitchenvere mitral regurgitation    a.  10/2015 s/p minimally invasive MV repair; b. 10/2015 Echo: mod MS, mean grad 6mm61m  . SpMarland Kitchennal stenosis   . SVT (supraventricular tachycardia) ([facility])    a. recurrent, usually responsive to vagal manuevers  . Tricuspid regurgitation    a. 10/2015 Echo: mild to mod TR.    Past Surgical History:  Procedure Laterality Date  . BACK SURGERY    . Back surgery x 2    . CARDIAC CATHETERIZATION N/A 08/08/2015   Procedure: Right/Left Heart Cath and Coronary Angiography;  Surgeon: MichSherren Mocha;  [facility];  Service: Cardiovascular;  Laterality: N/A;  . CARDIAC SURGERY     2 cardiac stents  . CARDIOVERSION N/A 10/20/2015   Procedure: CARDIOVERSION;  Surgeon: KennPixie Casino;  [facility]: Cardiovascular;  Laterality: N/A;  . CERVICAL SPINE SURGERY    . CHOLECYSTECTOMY N/A 07/08/2016   Procedure: LAPAROSCOPIC CHOLECYSTECTOMY WITH INTRAOPERATIVE CHOLANGIOGRAM;  Surgeon: BurkGeorganna Skeans;  [facility]: General;  Laterality: N/A;  . EYE SURGERY    . INTRAOPERATIVE TRANSESOPHAGEAL ECHOCARDIOGRAM  10/05/2015   Procedure: INTRAOPERATIVE TRANSESOPHAGEAL ECHOCARDIOGRAM;  Surgeon: ClarRexene Alberts;  [facility]  Service: Open Heart Surgery;;  . JOINT REPLACEMENT     shoulder  . LEFT HEART CATHETERIZATION WITH CORONARY ANGIOGRAM N/A 11/16/2014   Procedure: LEFT HEART CATHETERIZATION WITH CORONARY ANGIOGRAM;  Surgeon: Troy Sine, MD;  Location: [facility] C[facility] LAB;  Service: Cardiovascular;  Laterality: N/A;  . MITRAL VALVE REPAIR Right 10/04/2015   Procedure: MINIMALLY INVASIVE MITRAL VALVE REPAIR (MVR);  Surgeon: Rexene Alberts, MD;  Location: [place];  Service: Open Heart Surgery;  Laterality: Right;  . Rt rotator cuff repair     . TEE WITHOUT CARDIOVERSION N/A 06/22/2015   Procedure: TRANSESOPHAGEAL ECHOCARDIOGRAM (TEE);  Surgeon: Skeet Latch, MD;  Location: [place];  Service: Cardiovascular;  Laterality: N/A;  . TEE WITHOUT CARDIOVERSION N/A 10/04/2015   Procedure: TRANSESOPHAGEAL ECHOCARDIOGRAM (TEE);  Surgeon: Rexene Alberts, MD;  Location: [place];  Service: Open Heart Surgery;  Laterality: N/A;  . WOUND EXPLORATION N/A 10/05/2015   Procedure: Sternal Incision;  Surgeon: Rexene Alberts, MD;  Location: [place];  Service: Open Heart Surgery;  Laterality: N/A;    Family History  Problem Relation Age of Onset  . Heart failure Mother 56  . Heart attack Mother   . Heart attack Brother    Social History:  reports that he has quit smoking. His smoking use included cigarettes, pipe, and cigars. He has quit using smokeless tobacco. His smokeless tobacco use included chew. He reports that he does not drink alcohol or use drugs.  Allergies: No Known Allergies  Prior to Admission medications   Medication Sig Start Date End Date Taking? Authorizing Provider  allopurinol (ZYLOPRIM) 100 MG tablet TAKE ONE (1) TABLET EACH DAY 02/17/17   [name], MD  colchicine 0.6 MG tablet Take 1 tablet (0.6 mg total) by mouth daily. 09/04/16   [name], MD  finasteride (PROSCAR) 5 MG tablet TAKE ONE (1) TABLET BY MOUTH EVERY DAY 10/20/17   [name], MD  furosemide (LASIX) 40 MG tablet Take 2 tablets (80 mg total) by mouth 2 (two) times daily. 11/10/17   [name], MD  magnesium oxide (MAG-OX) 400 MG tablet Take 1 tablet (400 mg total) by mouth daily. 07/09/16   Florencia Reasons, MD  metoprolol tartrate (LOPRESSOR) 25 MG tablet TAKE ONE-HALF TABLET BY MOUTH TWICE DAILY 09/19/17   [name], MD  OVER THE COUNTER MEDICATION Neuroquell: Take 1 capsule by mouth once a day for immune health    [provider]  potassium chloride (KLOR-CON) 20 MEQ packet Take 40 mEq by mouth daily. 05/22/17   [name], MD  pravastatin (PRAVACHOL) 40 MG tablet TAKE ONE (1) TABLET EACH DAY AT 6 P.M. 08/12/17   [name], MD  spironolactone (ALDACTONE) 25 MG tablet Take 1 tablet (25 mg total) by mouth daily. 11/28/17   [name], MD  traMADol (ULTRAM) 50 MG tablet Take 1 tablet (50 mg total) by mouth every 6 (six) hours as needed for moderate pain. 11/11/16   Meuth, Brooke A, PA-C  warfarin (COUMADIN) 4 MG tablet Take 1 tablet (4 mg total) by mouth daily. Do not take with any other warfarin unless Dr. Dennard Schaumann instructs. 11/07/17   [name], MD  Wheat Dextrin (BENEFIBER) POWD Take by mouth See admin instructions. Mix one teaspoonful into 6-8 ounces of fluid/milk and drink once daily    [provider]     Results for orders placed or performed during the hospital encounter of 01/01/18 (from the past  48 hour(s))  Comprehensive metabolic panel     Status: Abnormal   Collection Time: 01/01/18 10:47 PM  Result Value Ref Range   Sodium 135 135 - 145 mmol/L   Potassium 4.3 3.5 - 5.1 mmol/L    Comment: HEMOLYSIS AT THIS LEVEL MAY AFFECT RESULT   Chloride 102 101 - 111 mmol/L   CO2 22 22 - 32 mmol/L   Glucose, Bld 166 (H) 65 - 99 mg/dL   BUN 43 (H) 6 - 20 mg/dL   Creatinine, Ser 1.62 (H) 0.61 - 1.24 mg/dL   Calcium 9.2 8.9 - 10.3 mg/dL   Total Protein 7.6 6.5 - 8.1 g/dL   Albumin 3.2 (L) 3.5 - 5.0 g/dL   AST 56 (H) 15 - 41 U/L   ALT 28 17 - 63 U/L   Alkaline Phosphatase 126 38 - 126 U/L   Total Bilirubin 1.3 (H) 0.3 - 1.2 mg/dL   GFR calc non Af Amer 37 (L) >60 mL/min   GFR calc Af Amer 43 (L) >60 mL/min    Comment: (NOTE) The eGFR has been calculated using the CKD EPI equation. This calculation has not been validated in all clinical situations. eGFR's persistently <60 mL/min signify possible Chronic Kidney Disease.    Anion gap 11 5 - 15    Comment: Performed at [facility], [address] [place], [place] [address]  Lipase, blood     Status: Abnormal    Collection Time: 01/01/18 10:47 PM  Result Value Ref Range   Lipase 64 (H) 11 - 51 U/L    Comment: Performed at [facility], [address] [place], [place] [address]  Troponin I     Status: Abnormal   Collection Time: 01/01/18 10:47 PM  Result Value Ref Range   Troponin I 0.26 (HH) <0.03 ng/mL    Comment: CRITICAL RESULT CALLED TO, READ BACK BY AND VERIFIED WITH KALA KING ON 01/01/18 AT 2329 JAG Performed at [facility], [address] [place], [place] [address]   CBC with Differential     Status: Abnormal   Collection Time: 01/01/18 10:47 PM  Result Value Ref Range   WBC 8.4 3.8 - 10.6 K/uL   RBC 4.94 4.40 - 5.90 MIL/uL   Hemoglobin 15.7 13.0 - 18.0 g/dL   HCT 46.8 40.0 - 52.0 %   MCV 94.8 80.0 - 100.0 fL   MCH 31.7 26.0 - 34.0 pg   MCHC 33.4 32.0 - 36.0 g/dL   RDW 16.7 (H) 11.5 - 14.5 %   Platelets 165 150 - 440 K/uL   Neutrophils Relative % 76 %   Neutro Abs 6.3 1.4 - 6.5 K/uL   Lymphocytes Relative 15 %   Lymphs Abs 1.3 1.0 - 3.6 K/uL   Monocytes Relative 6 %   Monocytes Absolute 0.5 0.2 - 1.0 K/uL   Eosinophils Relative 2 %   Eosinophils Absolute 0.2 0 - 0.7 K/uL   Basophils Relative 1 %   Basophils Absolute 0.1 0 - 0.1 K/uL    Comment: Performed at [facility], [address] [place], [place] [address]  Protime-INR     Status: Abnormal   Collection Time: 01/02/18 12:08 AM  Result Value Ref Range   Prothrombin Time 27.7 (H) 11.4 - 15.2 seconds   INR 2.61     Comment: Performed at [facility], [address] [place], [place] [address]   Dg Abdomen Acute W/chest  Result Date: 01/01/2018 CLINICAL DATA:  Constipation,  weakness, cough EXAM: DG ABDOMEN ACUTE W/ 1V CHEST COMPARISON:  11/10/2016 FINDINGS: Small right pleural effusion. Associated right lower lobe opacity, likely atelectasis. Left lung is clear. No pneumothorax. Stable right rib fractures, likely involving the right posterolateral 3rd through 7th ribs.  Nonobstructive bowel gas pattern. No evidence of free air under the diaphragm on the upright view. Cholecystectomy clips. Degenerative changes of the thoracolumbar spine. IMPRESSION: Small right pleural effusion. Associated right lower lobe opacity, likely atelectasis. Stable right rib fractures.  No pneumothorax. No evidence of small bowel obstruction or free air. Electronically Signed   By: Julian Hy M.D.   On: 01/01/2018 23:48    Review of Systems  Constitutional: Negative for chills and fever.  HENT: Negative for sore throat and tinnitus.   Eyes: Negative for blurred vision and redness.  Respiratory: Negative for cough and shortness of breath.   Cardiovascular: Negative for chest pain, palpitations, orthopnea and PND.  Gastrointestinal: Positive for abdominal pain and constipation. Negative for diarrhea, nausea and vomiting.  Genitourinary: Negative for dysuria, frequency and urgency.  Musculoskeletal: Negative for joint pain and myalgias.  Skin: Negative for rash.       No lesions  Neurological: Negative for speech change, focal weakness and weakness.  Endo/Heme/Allergies: Does not bruise/bleed easily.       No temperature intolerance  Psychiatric/Behavioral: Negative for depression and suicidal ideas.    Blood pressure 137/77, pulse 66, temperature 97.6 F (36.4 C), temperature source Oral, resp. rate 19, height '5\' 7"'  (1.702 m), weight 86.6 kg (191 lb), SpO2 97 %. Physical Exam  Vitals reviewed. Constitutional: He is oriented to person, place, and time. He appears well-developed and well-nourished. No distress.  HENT:  Head: Normocephalic and atraumatic.  Mouth/Throat: Oropharynx is clear and moist.  Eyes: Pupils are equal, round, and reactive to light. Conjunctivae and EOM are normal. Scleral icterus is present.  Neck: Normal range of motion. Neck supple. No JVD present. No tracheal deviation present. No thyromegaly present.  Cardiovascular: Normal rate, regular rhythm  and normal heart sounds. Exam reveals no gallop and no friction rub.  No murmur heard. Respiratory: Effort normal and breath sounds normal. No respiratory distress.  GI: Soft. Bowel sounds are normal. He exhibits distension. There is tenderness (Improved following disimpaction).  Genitourinary:  Genitourinary Comments: Deferred  Musculoskeletal: Normal range of motion. He exhibits no edema.  Lymphadenopathy:    He has no cervical adenopathy.  Neurological: He is alert and oriented to person, place, and time. No cranial nerve deficit.  Skin: Skin is warm and dry. No rash noted. No erythema.  Jaundiced  Psychiatric: He has a normal mood and affect. His behavior is normal. Judgment and thought content normal.     Assessment/Plan This is an 84 year old male admitted for kidney failure. 1.  Kidney failure: Acute on chronic kidney injury; prerenal.  Avoid nephrotoxic agents.  Gently hydrate with intravenous fluid. 2.  Elevated troponin: Secondary to prolonged straining due to constipation as well as poor renal clearance.  Continue to follow cardiac biomarkers.  Monitor telemetry.  Consult cardiology. 3.  Pancreatitis: Recurrent although the patient does not endorse epigastric pain at this time.  Previously the patient had gallstone pancreatitis and is now post cholecystectomy.  I have ordered a CT of his abdomen to visualize intrahepatic ducts.  No signs or symptoms of sepsis. 4.  Hepatitis: Also recurrent; concomitant with cirrhosis.  Viral etiologies negative in the past.  Unclear if the patient is drinking.  Check ethanol level.  Check GGT to rule out intrahepatic bile duct inflammation/malignancy.  The patient has mild ascites as well but does not have symptoms of SBP at this time.  Consider antibiotic prophylaxis 5.  Constipation: Start lactulose.  Check ammonia level.  This will help with bowel regularity as well as ammonia level if elevated. 6.  DVT prophylaxis: Heparin 7.  GI prophylaxis:  None The patient is a full code.  Time spent on admission orders and patient care approximately 45 minutes  [name], MD 01/02/2018, 1:34 AM

## 2018-01-02 NOTE — Consult Note (Signed)
Cardiology Consult    Patient ID: [name] MRN: [id number], DOB/AGE: [birth year]   Admit date: 01/01/2018 Date of Consult: 01/02/2018  Primary Physician: Fred Frizzle, MD Primary Cardiologist: Fred Breeding, MD Requesting Provider: Ashok Norris, MD  Patient Profile    [name] is a 84 y.o. male with a history of MR s/p MV repair (2017), PAF on coumadin, HFpEF, chronic SDH, CKD III, CAD s/p DES to LAD/RCA in 2016, HTN, HL, PSVT, PAH, OA, fatigue, orthostatic hypotension, prostate cancer, and TR, who is being seen today for the evaluation of elevated troponin at the request of [name].  Past Medical History   Past Medical History:  Diagnosis Date  . Atelectasis of right lung   . Chronic diastolic (congestive) heart failure ([place])    a. 10/2013 Echo: EF 55-60%, basal-mid anteroseptal and basal-mid inferoseptal HK; b. 02/2017 Echo: EF 60-65%, Gr3 DD.  Marland Kitchen Chronic fatigue   . Chronic subdural hematoma ([place])    a. 12/2015 Head CT: chronic right holo-hemispheric SDH w/o midline shift.  . CKD (chronic kidney disease), stage III ([place])   . Coronary artery disease    a. 11/2014 NSTEMI: DESx 2 placed to LAD and RCA; b. 08/2015 Cath: LAD patent stent, LCX 63m, RCA patent stent, 74m.  . Diabetes mellitus, type II ([place])    a. HgA1c 6.8 in 03/2015  . ED (erectile dysfunction)   . Gout   . Hemangioma of liver 08/02/2015   12 mm enhancing lesion seen on CT - suspicious for benign hemangioma but not diagnostic - MRI recommended  . HLD (hyperlipidemia)   . Hypertensive heart disease   . Ischemic finger ulcer ([place])    a. 03/2017 Seen for digital ulcers - felt to be embolic in setting of PAF. Pt on coumadin.  . Kidney stones   . Lower extremity edema    a. chronic  . OA (osteoarthritis)   . Orthostatic hypotension   . PAF (paroxysmal atrial fibrillation) ([place])    a. 10/2015 s/p DCCV;  b. CHA2DS2VASc = 6--> on coumadin; c. 10/2015 Echo: sev dil LA.  Marland Kitchen Physical deconditioning   .  Prostate cancer ([place])   . Pulmonary HTN ([place])   . Pulmonary hypertension ([place])    a. 10/2015 Echo: PASP 68mmHg.  Marland Kitchen Severe mitral regurgitation    a.  10/2015 s/p minimally invasive MV repair; b. 10/2015 Echo: mod MS, mean grad 82mmHg; c. 02/2017 Echo: EF 60-65%, Gr3 DD, mod Ca2+ MV annulus, mod to sev dil LA, mod dil RA, mod TR. PASP 77mmHg.  Marland Kitchen Spinal stenosis   . SVT (supraventricular tachycardia) ([facility])    a. recurrent, usually responsive to vagal manuevers  . Tricuspid regurgitation    a. 10/2015 Echo: mild to mod TR.    Past Surgical History:  Procedure Laterality Date  . BACK SURGERY    . Back surgery x 2    . CARDIAC CATHETERIZATION N/A 08/08/2015   Procedure: Right/Left Heart Cath and Coronary Angiography;  Surgeon: Sherren Mocha, MD;  Location: [place] [facility];  Service: Cardiovascular;  Laterality: N/A;  . CARDIAC SURGERY     2 cardiac stents  . CARDIOVERSION N/A 10/20/2015   Procedure: CARDIOVERSION;  Surgeon: Pixie Casino, MD;  Location: [place];  Service: Cardiovascular;  Laterality: N/A;  . CERVICAL SPINE SURGERY    . CHOLECYSTECTOMY N/A 07/08/2016   Procedure: LAPAROSCOPIC CHOLECYSTECTOMY WITH INTRAOPERATIVE CHOLANGIOGRAM;  Surgeon: Georganna Skeans, MD;  Location: [place];  Service: General;  Laterality: N/A;  . EYE SURGERY    . INTRAOPERATIVE TRANSESOPHAGEAL ECHOCARDIOGRAM  10/05/2015   Procedure: INTRAOPERATIVE TRANSESOPHAGEAL ECHOCARDIOGRAM;  Surgeon: Rexene Alberts, MD;  Location[facility];  Service: Open Heart Surgery;;  . JOINT REPLACEMENT     shoulder  . LEFT HEART CATHETERIZATION WITH CORONARY ANGIOGRAM N/A 11/16/2014   Procedure: LEFT HEART CATHETERIZATION WITH CORONARY ANGIOGRAM;  Surgeon: Troy Sine, MD;  Location: [facility] C[facility];  Service: Cardiovascular;  Laterality: N/A;  . MITRAL VALVE REPAIR Right 10/04/2015   Procedure: MINIMALLY INVASIVE MITRAL VALVE REPAIR (MVR);  Surgeon: Rexene Alberts, MD;  Location: [place];  Service: Open Heart Surgery;  Laterality:  Right;  . Rt rotator cuff repair    . TEE WITHOUT CARDIOVERSION N/A 06/22/2015   Procedure: TRANSESOPHAGEAL ECHOCARDIOGRAM (TEE);  Surgeon: Skeet Latch, MD;  Location: [place];  Service: Cardiovascular;  Laterality: N/A;  . TEE WITHOUT CARDIOVERSION N/A 10/04/2015   Procedure: TRANSESOPHAGEAL ECHOCARDIOGRAM (TEE);  Surgeon: Rexene Alberts, MD;  Location: [place];  Service: Open Heart Surgery;  Laterality: N/A;  . WOUND EXPLORATION N/A 10/05/2015   Procedure: Sternal Incision;  Surgeon: Rexene Alberts, MD;  Location: [place];  Service: Open Heart Surgery;  Laterality: N/A;     Allergies  No Known Allergies  History of Present Illness    84 y.o. male with a history of MR s/p MV repair (2017), PAF on coumadin, HFpEF, chronic SDH, CKD III, CAD s/p DES to LAD/RCA in 2016, HTN, HL, PSVT, PAH, OA, fatigue, orthostatic hypotension, prostate cancer, and TR.  His las catheterization was in 08/2015, revealing a patent LAD stent, mild LCX dzs, and patent RCA stent.  He has been medically managed since.  In 02/2017, he developed ulcerations on his fingers.  Echo showed nl EF w/o veg.  He was placed back on coumadin by his pcp and referred to [name], who agreed w/ [facility] and did not feel that further eval was warranted.  Since then, [name] has done reasonably well.  He has some degree of chronic DOE in the setting of HFpEF with variable volume status.  He works closely with his PCP to adjust diuretic dosing and maintain euvolemia.  Dry wt is generally 195 lbs.  [name] has chronic constipation, which has been worse over the past week.  In that setting, he has been straining a lot and has also noted increasing abd distention and pain.  No n/v.  In the setting of constipation and abd discomfort, his appetite has waned and his wt had dropped below 195 (191 on his home scale this week).  Due to progressive abd discomfort and constipation, he presented to the ED on 5/30 for evaluation.  There, abd xray  did not show any free air or obstruction.  CXR non-acute.  ECG w/o acute changes.  He was disimpacted with subsequent improvement in abd discomfort.  He was found to have mild LFT elevation, elevated lipase of 64, and GGT of 90.  Troponin was also elevated @ 0.26  0.39.  He has not had any chest pain or change in chronic degree of dyspnea.  He is feeling much better overall this AM.  Inpatient Medications    . allopurinol  100 mg Oral Daily  . docusate sodium  100 mg Oral BID  . finasteride  5 mg Oral Daily  . furosemide  20 mg Intravenous BID  . heparin  5,000 Units Subcutaneous Q8H  . lactulose  20 g Oral BID  .  metoprolol tartrate  12.5 mg Oral BID  . [START ON 01/03/2018] spironolactone  25 mg Oral Daily    Family History    Family History  Problem Relation Age of Onset  . Heart failure Mother 11  . Heart attack Mother   . Heart attack Brother    indicated that his mother is deceased. He indicated that his father is deceased. He indicated that his brother is deceased. He indicated that his maternal grandmother is deceased. He indicated that his maternal grandfather is deceased. He indicated that his paternal grandmother is deceased. He indicated that his paternal grandfather is deceased.   Social History    Social History   Socioeconomic History  . Marital status: Married    Spouse name: Not on file  . Number of children: 4  . Years of education: Not on file  . Highest education level: Not on file  Occupational History    Employer: RETIRED  Social Needs  . Financial resource strain: Not on file  . Food insecurity:    Worry: Not on file    Inability: Not on file  . Transportation needs:    Medical: Not on file    Non-medical: Not on file  Tobacco Use  . Smoking status: Former Smoker    Types: Cigarettes, Pipe, Landscape architect  . Smokeless tobacco: Former Systems developer    Types: Chew  . Tobacco comment: QUIT SMOKING  MANY YEARS AGO"  Substance and Sexual Activity  . Alcohol use: No    . Drug use: No  . Sexual activity: Not on file  Lifestyle  . Physical activity:    Days per week: Not on file    Minutes per session: Not on file  . Stress: Not on file  Relationships  . Social connections:    Talks on phone: Not on file    Gets together: Not on file    Attends religious service: Not on file    Active member of club or organization: Not on file    Attends meetings of clubs or organizations: Not on file    Relationship status: Not on file  . Intimate partner violence:    Fear of current or ex partner: Not on file    Emotionally abused: Not on file    Physically abused: Not on file    Forced sexual activity: Not on file  Other Topics Concern  . Not on file  Social History Narrative   ** Merged History Encounter **       Four adopted children.  Lives alone.      Review of Systems    General:  No chills, fever, night sweats or weight changes.  Cardiovascular:  No chest pain, +++ chronic dyspnea on exertion, occasional edema, no orthopnea, palpitations, paroxysmal nocturnal dyspnea. Dermatological: No rash, lesions/masses Respiratory: No cough, +++ chronic dyspnea Urologic: No hematuria, dysuria Abdominal:   +++ abd distention and constipation w/ diffuse discomfort in setting of constipation. Currently improved.  No nausea, vomiting, diarrhea, bright red blood per rectum, melena, or hematemesis Neurologic:  No visual changes, wkns, changes in mental status. All other systems reviewed and are otherwise negative except as noted above.  Physical Exam    Blood pressure 111/64, pulse 66, temperature 98.7 F (37.1 C), temperature source Axillary, resp. rate 20, height 5\' 7"  (1.702 m), weight 194 lb 3.2 oz (88.1 kg), SpO2 97 %.  General: Pleasant, NAD Psych: Normal affect. Neuro: Alert and oriented X 3. Moves all extremities spontaneously. HEENT: Normal  Neck: Supple without bruits or JVD. Lungs:  Resp regular and unlabored, bibasilar crackles. Heart: RRR no  s3, s4, 2/6 SEM @ base, 2/6 syst murmur @ apex. Abdomen: Soft, mild, diffuse, tenderness - much improved from yesterday, non-distended, BS + x 4.  Extremities: No clubbing, cyanosis or edema. DP/PT/Radials 2+ and equal bilaterally.  Labs   Recent Labs    01/01/18 2247 01/02/18 0459  TROPONINI 0.26* 0.39*   Lab Results  Component Value Date   WBC 8.4 01/01/2018   HGB 15.7 01/01/2018   HCT 46.8 01/01/2018   MCV 94.8 01/01/2018   PLT 165 01/01/2018    Recent Labs  Lab 01/01/18 2247  NA 135  K 4.3  CL 102  CO2 22  BUN 43*  CREATININE 1.62*  CALCIUM 9.2  PROT 7.6  BILITOT 1.3*  ALKPHOS 126  ALT 28  AST 56*  GLUCOSE 166*   Lab Results  Component Value Date   CHOL 158 06/05/2015   HDL 31 (L) 06/05/2015   LDLCALC 80 06/05/2015   TRIG 235 (H) 06/05/2015     Lab Results  Component Value Date   INR 2.78 01/02/2018   INR 2.61 01/02/2018   INR 3.4 (H) 12/15/2017    Radiology Studies    Dg Abdomen Acute W/chest  Result Date: 01/01/2018 CLINICAL DATA:  Constipation, weakness, cough EXAM: DG ABDOMEN ACUTE W/ 1V CHEST COMPARISON:  11/10/2016 FINDINGS: Small right pleural effusion. Associated right lower lobe opacity, likely atelectasis. Left lung is clear. No pneumothorax. Stable right rib fractures, likely involving the right posterolateral 3rd through 7th ribs. Nonobstructive bowel gas pattern. No evidence of free air under the diaphragm on the upright view. Cholecystectomy clips. Degenerative changes of the thoracolumbar spine. IMPRESSION: Small right pleural effusion. Associated right lower lobe opacity, likely atelectasis. Stable right rib fractures.  No pneumothorax. No evidence of small bowel obstruction or free air. Electronically Signed   By: Julian Hy M.D.   On: 01/01/2018 23:48   ECG & Cardiac Imaging    RSR, 67, LAD, inf infarct, LBBB  Assessment & Plan    1.  Elevated troponin/CAD: Pt w/ h/o CAD s/p prior NSTEMI and PCI/DES to the LAD and RCA in  11/2014.  Relook cath in 08/2015 w/ patent stents and otw nonobs LCX dzs.  He has not been having any chest pain @ home and has chronic DOE, which is unchanged.  In the setting of worsening constipation over the past week, he was admitted 5/31 and was noted to have elevation of lipase, lft's, creatinine, and troponins  0.26  0.39. ECG non-acute.  Given relative lack of cardiac symptoms, this does not appear to represent ACS.  Continue to cycle troponins until down-trending.  Will f/u echo to reassess LV fxn and consider ischemic testing.    2.  PAF: In sinus.  Chronic coumadin.  INR therapeutic this AM - also ordered [place] heparin.  I will d/c.  Given h/o prior emboli to digits, would bridge with heparin if we must continue to hold warfarin.  If no plans for invasive procedures, resume coumadin.  3.  H/o MV repair: Stable MV fxn on 02/2017 echo.  4.  Essential HTN: stable.  5.  HL: On prava @ home.  On hold in setting of elevated LFTs.  6.  AKI on CKD III:  In setting of pancreatits, constipation, poor appetite.  Receiving IVF but also ordered lasix 20 IV bid.  I will d/c.    7.  Pancreatitis:  Lipase 64, GGT 90.  IVF per IM.  CT pending.  8.  HFpEF:  Volume currently stable.  Hold lasix in setting of AKI.  Follow closely with IVF.  Signed, Murray Hodgkins, NP 01/02/2018, 10:00 AM  For questions or updates, please contact   Please consult [email or web address] for contact info under Cardiology/STEMI.

## 2018-01-02 NOTE — Progress Notes (Signed)
ANTICOAGULATION CONSULT NOTE - Initial Consult  Pharmacy Consult for warfarin Indication: atrial fibrillation  No Known Allergies  Patient Measurements: Height: 5\' 7"  (170.2 cm) Weight: 194 lb 3.2 oz (88.1 kg) IBW/kg (Calculated) : 66.1  Vital Signs: Temp: 98.7 F (37.1 C) (05/31 0753) Temp Source: Axillary (05/31 0753) BP: 111/64 (05/31 0753) Pulse Rate: 66 (05/31 0753)  Labs: Recent Labs    01/01/18 2247 01/02/18 0008 01/02/18 0459 01/02/18 0849 01/02/18 1127  HGB 15.7  --   --   --   --   HCT 46.8  --   --   --   --   PLT 165  --   --   --   --   LABPROT  --  27.7*  --  29.1*  --   INR  --  2.61  --  2.78  --   CREATININE 1.62*  --   --   --   --   TROPONINI 0.26*  --  0.39*  --  0.49*    Estimated Creatinine Clearance: 34.7 mL/min (A) (by C-G formula based on SCr of 1.62 mg/dL (H)).   Assessment: 84 yo male on warfarin PTA for AFib. Confirmed home dose with wife via daughter on phone: pt takes 4 mg of warfarin every day (confirmed that they have blue tabs at home), except half-tab on Mon/Fri.   Goal of Therapy:  INR 2-3 Monitor platelets by anticoagulation protocol: Yes   Plan:  INR therapeutic on admission. Will continue home regimen - pt due to receive 2 mg today. Daily INR. Will need to order CBC at least every three days.   Pharmacy will continue to follow.   Rocky Morel 01/02/2018,3:10 PM

## 2018-01-03 ENCOUNTER — Observation Stay (HOSPITAL_BASED_OUTPATIENT_CLINIC_OR_DEPARTMENT_OTHER)
Admit: 2018-01-03 | Discharge: 2018-01-03 | Disposition: A | Discharge status: Home / Self Care | Payer: Medicare Other | Attending: Cardiovascular Disease | Admitting: Cardiovascular Disease

## 2018-01-03 DIAGNOSIS — K859 Acute pancreatitis without necrosis or infection, unspecified: Secondary | ICD-10-CM | POA: Diagnosis not present

## 2018-01-03 DIAGNOSIS — N179 Acute kidney failure, unspecified: Secondary | ICD-10-CM | POA: Diagnosis not present

## 2018-01-03 DIAGNOSIS — I361 Nonrheumatic tricuspid (valve) insufficiency: Secondary | ICD-10-CM

## 2018-01-03 DIAGNOSIS — I371 Nonrheumatic pulmonary valve insufficiency: Secondary | ICD-10-CM

## 2018-01-03 DIAGNOSIS — R748 Abnormal levels of other serum enzymes: Secondary | ICD-10-CM | POA: Diagnosis not present

## 2018-01-03 DIAGNOSIS — K759 Inflammatory liver disease, unspecified: Secondary | ICD-10-CM | POA: Diagnosis not present

## 2018-01-03 LAB — PROTIME-INR
INR: 3.57
Prothrombin Time: 35.4 seconds — ABNORMAL HIGH (ref 11.4–15.2)

## 2018-01-03 MED ORDER — POLYETHYLENE GLYCOL 3350 17 G PO PACK: 17.0000 g | PACK | Freq: Every day | ORAL | 0 refills | Status: DC

## 2018-01-03 MED ORDER — DOCUSATE SODIUM 100 MG PO CAPS: 100.0000 mg | ORAL_CAPSULE | Freq: Every day | ORAL | 2 refills | Status: AC | PRN

## 2018-01-03 NOTE — Discharge Instructions (Signed)

## 2018-01-03 NOTE — Discharge Summary (Signed)
[employer] - [place] at [facility], 84 y.o., DOB [birth year], MRN [id number]. Admission date: 01/01/2018 Discharge Date 01/03/2018 Primary MD [name], MD Admitting Physician [name], MD  Admission Diagnosis  Elevated troponin [R74.8] AKI (acute kidney injury) ([place]) [N17.9] Constipation, unspecified constipation type [K59.00]  Discharge Diagnosis   Active Problems: Acute on chronic kidney injury Elevated troponin due to demand ischemia Pancreatitis Recurrent Hepatitis Constipation          Hospital Course  The patient with extensive past medical history most significant for coronary artery disease status post MI and and stent placement, chronic diastolic heart failure as well as significant mitral and tricuspid regurg that is post minimally invasive repair, and chronic subdural hematoma presents to the emergency department complaining of constipation and weakness. Patient had CT abdomen which showed pancreatic cyst pancreatic cyst but no other abnormality.  Patient's abdominal pain resolved after bowel movements.  He is very anxious to go home.  Patient also was noted to have elevated troponin felt to be due to demand ischemia I recommended he follow-up with his cardiologist.              Consults  cardiology  Significant Tests:  See full reports for all details     Ct Abdomen Pelvis W Wo Contrast  Result Date: 01/02/2018 CLINICAL DATA:  Unintended weight loss, abdominal pain. EXAM: CT ABDOMEN AND PELVIS WITHOUT AND WITH CONTRAST TECHNIQUE: Multidetector CT imaging of the abdomen and pelvis was performed following the standard protocol before and following the bolus administration of intravenous contrast. CONTRAST:  62mL ISOVUE-300 IOPAMIDOL (ISOVUE-300) INJECTION 61% COMPARISON:  MR abdomen 07/04/2016 and CT abdomen pelvis 08/02/2015. FINDINGS: Lower chest: Small right pleural effusion with collapse/consolidation in the  adjacent right lower lobe. Pleural calcifications on the right with calcified granulomas. Image quality is degraded by respiratory motion. Heart is moderately enlarged. Atherosclerotic calcification of the arterial vasculature, including coronary arteries and aortic bowel. Pulmonary arteries are enlarged. No pericardial effusion. Hepatobiliary: Liver margin is irregular in the liver appears shrunken. Cholecystectomy. No biliary ductal dilatation. Pancreas: Low-attenuation lesions in the tail of the pancreas measure up to 12 mm, similar to 07/04/2016. No ductal dilatation or gland atrophy. Spleen: Negative. Adrenals/Urinary Tract: Right adrenal gland is unremarkable. Thickening of the body of the left adrenal gland. Subcentimeter low-attenuation lesions in the kidneys are too small to characterize. A hyperdense lesion of the posterior interpolar left kidney measures 1.6 cm, characterized as a hemorrhagic cyst on 07/04/2016. Ureters are decompressed. Prostate indents the bladder. A diverticulum is seen off the right lateral wall of the bladder. Stomach/Bowel: Stomach, small bowel, appendix and colon are unremarkable. Vascular/Lymphatic: Atherosclerotic calcification of the arterial vasculature without abdominal aortic aneurysm. No pathologically enlarged lymph nodes. Reproductive: Prostate is enlarged slightly. Other: No free fluid.  Mesenteries and peritoneum are unremarkable. Musculoskeletal: Degenerative changes in the spine and hips. No worrisome lytic or sclerotic lesions. Old rib fractures. IMPRESSION: 1. No findings to explain the patient's symptoms. 2. Small right pleural effusion with collapse/consolidation in the adjacent right lower lobe. Follow-up to clearing is recommended to exclude malignancy. 3.  Aortic atherosclerosis (ICD10-170.0). 4. Enlarged pulmonary arteries, indicative of pulmonary arterial hypertension. 5. Cirrhosis. 6. Low-attenuation lesions in the tail the pancreas are grossly stable from  07/04/2016 and may represent pseudocysts. Cystic pancreatic neoplasm cannot be excluded. Follow-up imaging in 2 years is recommended to ensure stability. This recommendation follows ACR consensus guidelines: Management of Incidental Pancreatic Cysts: A  White Paper of the ACR Incidental Findings Committee. [place] 6270;35:009-381. 7. Enlarged prostate. Electronically Signed   By: Lorin Picket M.D.   On: 01/02/2018 14:22   Dg Abdomen Acute W/chest  Result Date: 01/01/2018 CLINICAL DATA:  Constipation, weakness, cough EXAM: DG ABDOMEN ACUTE W/ 1V CHEST COMPARISON:  11/10/2016 FINDINGS: Small right pleural effusion. Associated right lower lobe opacity, likely atelectasis. Left lung is clear. No pneumothorax. Stable right rib fractures, likely involving the right posterolateral 3rd through 7th ribs. Nonobstructive bowel gas pattern. No evidence of free air under the diaphragm on the upright view. Cholecystectomy clips. Degenerative changes of the thoracolumbar spine. IMPRESSION: Small right pleural effusion. Associated right lower lobe opacity, likely atelectasis. Stable right rib fractures.  No pneumothorax. No evidence of small bowel obstruction or free air. Electronically Signed   By: Julian Hy M.D.   On: 01/01/2018 23:48       Today   Subjective:   [name] patient doing much better denies any complaints wants to go home  Objective:   Blood pressure 128/74, pulse (!) 58, temperature 97.6 F (36.4 C), temperature source Oral, resp. rate 18, height 5\' 7"  (1.702 m), weight 91.6 kg (201 lb 14.4 oz), SpO2 99 %.  .  Intake/Output Summary (Last 24 hours) at 01/03/2018 1552 Last data filed at 01/03/2018 0900 Gross per 24 hour  Intake 1320 ml  Output 650 ml  Net 670 ml    Exam VITAL SIGNS: Blood pressure 128/74, pulse (!) 58, temperature 97.6 F (36.4 C), temperature source Oral, resp. rate 18, height 5\' 7"  (1.702 m), weight 91.6 kg (201 lb 14.4 oz), SpO2 99 %.  GENERAL:   84 y.o.-year-old patient lying in the bed with no acute distress.  EYES: Pupils equal, round, reactive to light and accommodation. No scleral icterus. Extraocular muscles intact.  HEENT: Head atraumatic, normocephalic. Oropharynx and nasopharynx clear.  NECK:  Supple, no jugular venous distention. No thyroid enlargement, no tenderness.  LUNGS: Normal breath sounds bilaterally, no wheezing, rales,rhonchi or crepitation. No use of accessory muscles of respiration.  CARDIOVASCULAR: S1, S2 normal. No murmurs, rubs, or gallops.  ABDOMEN: Soft, nontender, nondistended. Bowel sounds present. No organomegaly or mass.  EXTREMITIES: No pedal edema, cyanosis, or clubbing.  NEUROLOGIC: Cranial nerves II through XII are intact. Muscle strength 5/5 in all extremities. Sensation intact. Gait not checked.  PSYCHIATRIC: The patient is alert and oriented x 3.  SKIN: No obvious rash, lesion, or ulcer.   Data Review     CBC w Diff:  Lab Results  Component Value Date   WBC 8.4 01/01/2018   HGB 15.7 01/01/2018   HCT 46.8 01/01/2018   PLT 165 01/01/2018   LYMPHOPCT 15 01/01/2018   MONOPCT 6 01/01/2018   EOSPCT 2 01/01/2018   BASOPCT 1 01/01/2018   CMP:  Lab Results  Component Value Date   NA 135 01/01/2018   K 4.3 01/01/2018   CL 102 01/01/2018   CO2 22 01/01/2018   BUN 43 (H) 01/01/2018   BUN 19 06/19/2016   CREATININE 1.62 (H) 01/01/2018   CREATININE 1.27 (H) 12/15/2017   PROT 7.6 01/01/2018   ALBUMIN 3.2 (L) 01/01/2018   BILITOT 1.3 (H) 01/01/2018   ALKPHOS 126 01/01/2018   AST 56 (H) 01/01/2018   ALT 28 01/01/2018  .  Micro Results No results found for this or any previous visit (from the past 240 hour(s)).   Code Status History    Date Active Date Inactive Code  Status Order ID Comments User Context   01/02/2018 0236 01/03/2018 1519 Full Code [id number]  [name], MD Inpatient   11/07/2016 1652 11/11/2016 1830 Full Code [id number]  Georganna Skeans, MD Inpatient   07/03/2016 0358  07/09/2016 1944 Full Code [id number]  Ivor Costa, MD ED   12/08/2015 2139 12/11/2015 1847 Full Code [id number]  Sherren Mocha, MD Inpatient   10/17/2015 2355 10/31/2015 2252 Full Code [id number]  Ciccotto, Rande Brunt, MD ED   08/08/2015 1241 08/08/2015 1844 Full Code [id number]  Sherren Mocha, MD Inpatient   06/04/2015 2122 06/05/2015 1936 Full Code [id number]  Lamar Sprinkles, MD Inpatient   11/16/2014 2016 11/17/2014 1553 Full Code [id number]  Troy Sine, MD Inpatient   11/14/2014 2006 11/16/2014 2016 Full Code [id number]  Isaiah Serge, NP Inpatient   06/29/2013 1427 07/03/2013 1422 Full Code [id number]  Caren Griffins, MD ED    Advance Directive Documentation     Most Recent Value  Type of Advance Directive  Healthcare Power of Attorney, Living will  Pre-existing out of facility DNR order (yellow form or pink MOST form)  -  "MOST" Form in Place?  -          Follow-up Information    [name], MD Follow up in 6 day(s).   Specialty:  Family Medicine Contact information: [address] [place] [address] [phone]        [name], MD Follow up in 10 day(s).   Specialty:  Cardiology Why:  hosp f/u Contact information: [place] [address] [phone]           Discharge Medications   Allergies as of 01/03/2018   No Known Allergies     Medication List    TAKE these medications   allopurinol 100 MG tablet Commonly known as:  ZYLOPRIM TAKE ONE (1) TABLET EACH DAY   aspirin EC 81 MG tablet Take 81 mg by mouth daily.   BENEFIBER Powd Take by mouth See admin instructions. Mix one teaspoonful into 6-8 ounces of fluid/milk and drink once daily   colchicine 0.6 MG tablet Take 1 tablet (0.6 mg total) by mouth daily.   docusate sodium 100 MG capsule Commonly known as:  COLACE Take 1 capsule (100 mg total) by mouth daily as needed for moderate constipation.   finasteride 5 MG tablet Commonly known as:   PROSCAR TAKE ONE (1) TABLET BY MOUTH EVERY DAY   furosemide 40 MG tablet Commonly known as:  LASIX Take 2 tablets (80 mg total) by mouth 2 (two) times daily.   magnesium oxide 400 MG tablet Commonly known as:  MAG-OX Take 1 tablet (400 mg total) by mouth daily.   metoprolol tartrate 25 MG tablet Commonly known as:  LOPRESSOR TAKE ONE-HALF TABLET BY MOUTH TWICE DAILY   OVER THE COUNTER MEDICATION Neuroquell: Take 1 capsule by mouth once a day for immune health   polyethylene glycol packet Commonly known as:  MIRALAX Take 17 g by mouth daily.   potassium chloride 20 MEQ packet Commonly known as:  KLOR-CON Take 40 mEq by mouth daily.   pravastatin 40 MG tablet Commonly known as:  PRAVACHOL TAKE ONE (1) TABLET EACH DAY AT 6 P.M.   spironolactone 25 MG tablet Commonly known as:  ALDACTONE Take 1 tablet (25 mg total) by mouth daily.   traMADol 50 MG tablet Commonly known as:  ULTRAM Take 1 tablet (50 mg total) by mouth every 6 (six)  hours as needed for moderate pain.   warfarin 4 MG tablet Commonly known as:  COUMADIN Take 1 tablet (4 mg total) by mouth daily. Do not take with any other warfarin unless Dr. Dennard Schaumann instructs. What changed:  additional instructions          Total Time in preparing paper work, data evaluation and todays exam - 35 minutes  Dustin Flock M.D on 01/03/2018 at [place]  [phone]

## 2018-01-03 NOTE — Progress Notes (Signed)
Advanced care plan.  Purpose of the Encounter: CODE STATUS  Parties in [place] him self and wife  Patient's Decision Capacity:intact  Subjective/Patient's story: Patient is a 84 year old with chronic diastolic CHF, coronary artery disease, diabetes type 2, gout, PAF who was admitted with constipation nausea vomiting poor p.o. intake.   Objective/Medical story I discussed with the patient with his advanced age regarding CODE STATUS.  Explained him intubation CPR and cardiac resuscitation. Patient states that he does not want to be resuscitated   Goals of care determination: DNR    CODE STATUS:  DNR  Time spent discussing advanced care planning: 16 minutes

## 2018-01-03 NOTE — Progress Notes (Signed)
ANTICOAGULATION CONSULT NOTE -  Pharmacy Consult for warfarin Indication: atrial fibrillation  No Known Allergies  Patient Measurements: Height: 5\' 7"  (170.2 cm) Weight: 201 lb 14.4 oz (91.6 kg) IBW/kg (Calculated) : 66.1  Vital Signs: Temp: 97.6 F (36.4 C) (06/01 0840) Temp Source: Oral (06/01 0840) BP: 128/74 (06/01 0840) Pulse Rate: 58 (06/01 0840)  Labs: Recent Labs    01/01/18 2247 01/02/18 0008 01/02/18 0459 01/02/18 0849 01/02/18 1127 01/02/18 1701 01/03/18 0458  HGB 15.7  --   --   --   --   --   --   HCT 46.8  --   --   --   --   --   --   PLT 165  --   --   --   --   --   --   LABPROT  --  27.7*  --  29.1*  --   --  35.4*  INR  --  2.61  --  2.78  --   --  3.57  CREATININE 1.62*  --   --   --   --   --   --   TROPONINI 0.26*  --  0.39*  --  0.49* 0.58*  --     Estimated Creatinine Clearance: 35.3 mL/min (A) (by C-G formula based on SCr of 1.62 mg/dL (H)).   Assessment: 84 yo male on warfarin PTA for AFib. Confirmed home dose with wife via daughter on phone: pt takes 4 mg of warfarin every day (confirmed that they have blue tabs at home), except half-tab on Mon/Fri.   5/31 INR 2.78  Warfarin 2 mg 6/1   INR 3.57   Goal of Therapy:  INR 2-3 Monitor platelets by anticoagulation protocol: Yes   Plan:  INR with jump-supratherapeutic. Will hold todays dose and recheck INR in am. Daily INR. Will need to order CBC at least every three days.   Pharmacy will continue to follow.   Dahlia Nifong A 01/03/2018,9:20 AM

## 2018-01-03 NOTE — Progress Notes (Signed)
Patient off unit for echocardiogram. Will resume care upon return. [name] [facility]

## 2018-01-05 LAB — ECHOCARDIOGRAM COMPLETE
Height: 67 in
Weight: 3230.4 oz

## 2018-01-12 ENCOUNTER — Encounter: Payer: Self-pay | Admitting: Cardiology

## 2018-01-12 ENCOUNTER — Other Ambulatory Visit: Payer: Self-pay | Admitting: *Deleted

## 2018-01-12 NOTE — Patient Outreach (Signed)
[facility]) Care Management  01/12/2018  MANAN OLMO 1929-06-06 [id number]  RN Health Coach attempted follow up outreach call to patient.  Patient was unavailable. HIPPA compliance voicemail message left with return callback number.  Plan: RN will call patient again within 10 business days.  [place] Care Management [phone]

## 2018-01-12 NOTE — Progress Notes (Signed)
HPI  The patient presents for evaluation of HeFPEF, CAD, narrow complex tachycardia and MR.  He is status post MV repair in 10/2015.  His immediate post op course was complicated by hypotension inducible SAM and MR.  This improved with medication adjustment including adding a beta blocker.  Post op he had atrial fib (requiring DCCV and amiodarone), acute on chronic diastolic HF, syncope with orthostasis.  We saw him last because of ulcers that have developed on his fingertips.  These were managed conservatively and thought to possibly be small vessel spasm.    Since I last saw him he was in the hospital with constipation.  I did review these.  He did have some troponin elevation but no cardiovascular symptoms and this was thought to be probably related to demand ischemia.  It does not look like his meds were changed coming out of the hospital.  Since going home he is done okay.  He does have some chronic lower extremity swelling.  He has some decreased energy and decreased appetite.  However, he can still get out and ride his tractor.  He walks around at home.  He uses a cane.  He has some mucus that he has to clear periodically but is not describing PND or orthopnea.  He has no new acute shortness of breath.  He has some spells of disorientation but he denies any tachypalpitations.  He has no chest pressure, neck or arm discomfort.  Of note the digital ulcers have completely resolved and he could not even remember having them.  No Known Allergies  Current Outpatient Medications  Medication Sig Dispense Refill  . allopurinol (ZYLOPRIM) 100 MG tablet TAKE ONE (1) TABLET EACH DAY 90 tablet 3  . colchicine 0.6 MG tablet Take 1 tablet (0.6 mg total) by mouth daily. 30 tablet 5  . docusate sodium (COLACE) 100 MG capsule Take 1 capsule (100 mg total) by mouth daily as needed for moderate constipation. 60 capsule 2  . finasteride (PROSCAR) 5 MG tablet TAKE ONE (1) TABLET BY MOUTH EVERY DAY 90 tablet 3  .  furosemide (LASIX) 40 MG tablet Take 2 tablets (80 mg total) by mouth 2 (two) times daily. 120 tablet 2  . magnesium oxide (MAG-OX) 400 MG tablet Take 1 tablet (400 mg total) by mouth daily. 30 tablet 0  . metoprolol tartrate (LOPRESSOR) 25 MG tablet TAKE ONE-HALF TABLET BY MOUTH TWICE DAILY 90 tablet 3  . OVER THE COUNTER MEDICATION Neuroquell: Take 1 capsule by mouth once a day for immune health    . polyethylene glycol (MIRALAX) packet Take 17 g by mouth daily. 30 each 0  . potassium chloride (KLOR-CON) 20 MEQ packet Take 40 mEq by mouth daily. 60 packet 0  . pravastatin (PRAVACHOL) 40 MG tablet TAKE ONE (1) TABLET EACH DAY AT 6 P.M. 30 tablet 9  . spironolactone (ALDACTONE) 25 MG tablet Take 1 tablet (25 mg total) by mouth daily. 30 tablet 1  . traMADol (ULTRAM) 50 MG tablet Take 1 tablet (50 mg total) by mouth every 6 (six) hours as needed for moderate pain. 30 tablet 0  . warfarin (COUMADIN) 4 MG tablet Take 1 tablet (4 mg total) by mouth daily. Do not take with any other warfarin unless Dr. Dennard Schaumann instructs. (Patient taking differently: Currently taking: 2MG  (Monday & Friday morning) and 4MG  (all other mornings)) 30 tablet 3  . Wheat Dextrin (BENEFIBER) POWD Take by mouth See admin instructions. Mix one teaspoonful into 6-8 ounces of fluid/milk  and drink once daily     No current facility-administered medications for this visit.     Past Medical History:  Diagnosis Date  . Atelectasis of right lung   . Chronic diastolic (congestive) heart failure ([place])    a. 10/2013 Echo: EF 55-60%, basal-mid anteroseptal and basal-mid inferoseptal HK; b. 02/2017 Echo: EF 60-65%, Gr3 DD.  Marland Kitchen Chronic fatigue   . Chronic subdural hematoma ([place])    a. 12/2015 Head CT: chronic right holo-hemispheric SDH w/o midline shift.  . CKD (chronic kidney disease), stage III ([place])   . Coronary artery disease    a. 11/2014 NSTEMI: DESx 2 placed to LAD and RCA; b. 08/2015 Cath: LAD patent stent, LCX 48m, RCA patent stent,  44m.  . Diabetes mellitus, type II ([place])    a. HgA1c 6.8 in 03/2015  . ED (erectile dysfunction)   . Gout   . Hemangioma of liver 08/02/2015   12 mm enhancing lesion seen on CT - suspicious for benign hemangioma but not diagnostic - MRI recommended  . HLD (hyperlipidemia)   . Hypertension   . Hypertensive heart disease   . Ischemic finger ulcer ([place])    a. 03/2017 Seen for digital ulcers - felt to be embolic in setting of PAF. Pt on coumadin.  . Kidney stones   . Lower extremity edema    a. chronic  . OA (osteoarthritis)   . Orthostatic hypotension   . PAF (paroxysmal atrial fibrillation) ([place])    a. 10/2015 s/p DCCV;  b. CHA2DS2VASc = 6--> on coumadin; c. 10/2015 Echo: sev dil LA.  . Prostate cancer ([place])   . Pulmonary HTN ([place])   . Pulmonary hypertension ([place])    a. 10/2015 Echo: PASP 51mmHg.  Marland Kitchen Renal insufficiency   . Severe mitral regurgitation    a.  10/2015 s/p minimally invasive MV repair; b. 10/2015 Echo: mod MS, mean grad 32mmHg; c. 02/2017 Echo: EF 60-65%, Gr3 DD, mod Ca2+ MV annulus, mod to sev dil LA, mod dil RA, mod TR. PASP 58mmHg.  Marland Kitchen Spinal stenosis   . SVT (supraventricular tachycardia) ([facility])    a. recurrent, usually responsive to vagal manuevers  . Tricuspid regurgitation    a. 10/2015 Echo: mild to mod TR.    Past Surgical History:  Procedure Laterality Date  . BACK SURGERY    . Back surgery x 2    . CARDIAC CATHETERIZATION N/A 08/08/2015   Procedure: Right/Left Heart Cath and Coronary Angiography;  Surgeon: Sherren Mocha, MD;  Location: [place] [facility] CV LAB;  Service: Cardiovascular;  Laterality: N/A;  . CARDIAC SURGERY     2 cardiac stents  . CARDIOVERSION N/A 10/20/2015   Procedure: CARDIOVERSION;  Surgeon: Pixie Casino, MD;  Location: [place];  Service: Cardiovascular;  Laterality: N/A;  . CERVICAL SPINE SURGERY    . CHOLECYSTECTOMY N/A 07/08/2016   Procedure: LAPAROSCOPIC CHOLECYSTECTOMY WITH INTRAOPERATIVE CHOLANGIOGRAM;  Surgeon: Georganna Skeans, MD;   Location: [place];  Service: General;  Laterality: N/A;  . EYE SURGERY    . INTRAOPERATIVE TRANSESOPHAGEAL ECHOCARDIOGRAM  10/05/2015   Procedure: INTRAOPERATIVE TRANSESOPHAGEAL ECHOCARDIOGRAM;  Surgeon: Rexene Alberts, MD;  Location[facility];  Service: Open Heart Surgery;;  . JOINT REPLACEMENT     shoulder  . LEFT HEART CATHETERIZATION WITH CORONARY ANGIOGRAM N/A 11/16/2014   Procedure: LEFT HEART CATHETERIZATION WITH CORONARY ANGIOGRAM;  Surgeon: Troy Sine, MD;  Location: [facility] CATH LAB;  Service: Cardiovascular;  Laterality: N/A;  . MITRAL VALVE REPAIR Right 10/04/2015  Procedure: MINIMALLY INVASIVE MITRAL VALVE REPAIR (MVR);  Surgeon: Rexene Alberts, MD;  Location: [place];  Service: Open Heart Surgery;  Laterality: Right;  . Rt rotator cuff repair    . TEE WITHOUT CARDIOVERSION N/A 06/22/2015   Procedure: TRANSESOPHAGEAL ECHOCARDIOGRAM (TEE);  Surgeon: Skeet Latch, MD;  Location: [place];  Service: Cardiovascular;  Laterality: N/A;  . TEE WITHOUT CARDIOVERSION N/A 10/04/2015   Procedure: TRANSESOPHAGEAL ECHOCARDIOGRAM (TEE);  Surgeon: Rexene Alberts, MD;  Location: [place];  Service: Open Heart Surgery;  Laterality: N/A;  . WOUND EXPLORATION N/A 10/05/2015   Procedure: Sternal Incision;  Surgeon: Rexene Alberts, MD;  Location: [place];  Service: Open Heart Surgery;  Laterality: N/A;     ROS:   As stated in the HPI and negative for all other systems.   PHYSICAL EXAM BP 128/72   Pulse (!) 58   Ht 5\' 7"  (1.702 m)   Wt 194 lb 1.6 oz (88 kg)   BMI 30.40 kg/m   GENERAL:  Well appearing NECK:  No jugular venous distention, waveform within normal limits, carotid upstroke brisk and symmetric, no bruits, no thyromegaly LUNGS:  Clear to auscultation bilaterally CHEST:  Well healed sternotomy scar. HEART:  PMI not displaced or sustained,S1 and S2 within normal limits, no S3, no S4, no clicks, no rubs, 2/6 systolic murmur at the apex and radiating to the axilla, no diastolic murmurs ABD:   Flat, positive bowel sounds normal in frequency in pitch, no bruits, no rebound, no guarding, no midline pulsatile mass, no hepatomegaly, no splenomegaly EXT:  2 plus pulses throughout, mild edema, no cyanosis no clubbing   EKG:  NA  Lab Results  Component Value Date   CHOL 158 06/05/2015   TRIG 235 (H) 06/05/2015   HDL 31 (L) 06/05/2015   LDLCALC 80 06/05/2015   Lab Results  Component Value Date   HGBA1C 6.5 (H) 12/15/2017    ASSESSMENT AND PLAN  Digital Ulcers:    This has resolved.    Mitral valve repair:   He has a stable valve repair on echo in May.  No change in therapy or further imaging at this point.   Atrial fib:      [name] has a CHA2DS2 - VASc score of 5 with a risk of stroke of 6.7%.   He will continue meds as listed.   SVT-    He has had no symptomatic recurrence of this.    HFpEF/severe LVH/mod MR/pHTN (RVSP 19mmHg) -   He seems to be euvolemic.  No change in therapy.   CAD-     The patient has no new sypmtoms.  No further cardiovascular testing is indicated.  We will continue with aggressive risk reduction and meds as listed.  I will stop the ASA.  I do not think that he needs any further testing.    DM-    hemoglobin A1c was 6.5.  I will defer to Fred Frizzle, MD  HLD-   LDL 80. Goal <70 with CAD. We placed him on statin. He is overdue for lipid profile.  I will order this.    CKD II:  Creat has been stable.

## 2018-01-13 ENCOUNTER — Ambulatory Visit: Payer: Medicare Other | Admitting: Family Medicine

## 2018-01-13 ENCOUNTER — Ambulatory Visit: Payer: Medicare Other | Admitting: Cardiology

## 2018-01-13 ENCOUNTER — Encounter: Payer: Self-pay | Admitting: Cardiology

## 2018-01-13 VITALS — BP 128/72 | HR 58 | Ht 67.0 in | Wt 194.1 lb

## 2018-01-13 DIAGNOSIS — Z9889 Other specified postprocedural states: Secondary | ICD-10-CM | POA: Diagnosis not present

## 2018-01-13 DIAGNOSIS — I2510 Atherosclerotic heart disease of native coronary artery without angina pectoris: Secondary | ICD-10-CM

## 2018-01-13 DIAGNOSIS — R7989 Other specified abnormal findings of blood chemistry: Secondary | ICD-10-CM

## 2018-01-13 DIAGNOSIS — I5032 Chronic diastolic (congestive) heart failure: Secondary | ICD-10-CM | POA: Diagnosis not present

## 2018-01-13 DIAGNOSIS — R778 Other specified abnormalities of plasma proteins: Secondary | ICD-10-CM

## 2018-01-13 DIAGNOSIS — I480 Paroxysmal atrial fibrillation: Secondary | ICD-10-CM | POA: Diagnosis not present

## 2018-01-13 DIAGNOSIS — R748 Abnormal levels of other serum enzymes: Secondary | ICD-10-CM

## 2018-01-13 NOTE — Patient Instructions (Signed)
Medication Instructions:  STOP- Aspirin  If you need a refill on your cardiac medications before your next appointment, please call your pharmacy.  Labwork: None Ordered  Testing/Procedures: None Ordered  Follow-Up: Your physician wants you to follow-up in: 4 Months with APP.      Thank you for choosing [facility] at [facility]!!

## 2018-01-15 ENCOUNTER — Ambulatory Visit (INDEPENDENT_AMBULATORY_CARE_PROVIDER_SITE_OTHER): Payer: Medicare Other | Admitting: Family Medicine

## 2018-01-15 ENCOUNTER — Encounter: Payer: Self-pay | Admitting: Family Medicine

## 2018-01-15 VITALS — BP 100/60 | HR 78 | Temp 97.6°F | Resp 16 | Ht 67.0 in | Wt 198.0 lb

## 2018-01-15 DIAGNOSIS — E119 Type 2 diabetes mellitus without complications: Secondary | ICD-10-CM | POA: Diagnosis not present

## 2018-01-15 DIAGNOSIS — I5022 Chronic systolic (congestive) heart failure: Secondary | ICD-10-CM

## 2018-01-15 DIAGNOSIS — I480 Paroxysmal atrial fibrillation: Secondary | ICD-10-CM | POA: Diagnosis not present

## 2018-01-15 DIAGNOSIS — Z09 Encounter for follow-up examination after completed treatment for conditions other than malignant neoplasm: Secondary | ICD-10-CM

## 2018-01-15 LAB — PT WITH INR/FINGERSTICK
INR FINGERSTICK: 2.4 ratio — AB
PT, fingerstick: 29 s — ABNORMAL HIGH (ref 10.5–13.1)

## 2018-01-15 NOTE — Progress Notes (Signed)
Subjective:    Patient ID: [name], male    DOB: 05/13/1929, 84 y.o.   MRN: [id number]  HPI  12/15/17 However, surprisingly at that visit, his creatinine was better than his baseline.  Therefore I made no changes in his medication but I did quickly recheck his renal function again on April 10 and found to be 1.59 which again is approximate to the patient's baseline.  He is here today to recheck his INR for his chronic anticoagulation due to atrial fibrillation.  He is also due to monitor his weight, his blood pressure, his renal function, as well as his glycemic control for his diabetes.  At his last visit, he weighed 207 pounds.  Today he is 205.  However the patient independently adjust his fluid pills from what I can gather today.  Most days he takes 80 mg of Lasix twice daily in addition to spironolactone.  However as the patient states, sometimes he will only take 1 Lasix pill in the evening if he feels like he has diuresed too much.  From what I can gather, most days he takes 80 mg twice daily with the spironolactone.  His weight has stayed stable.  His lungs are clear to auscultation bilaterally and there is no evidence of fluid overload.  His INR today is supratherapeutic at 3.4.  He is taking 4 mg of Coumadin every day per his report.  At that time, my plan was: Patient's INR is supratherapeutic.  Decrease Coumadin to 2 mg on Monday and Friday and 4 mg on all other days.  Recheck INR in 4 weeks.  Patient's heart rate varies between 50 and 60 bpm.  Although bradycardic, he is asymptomatic.  Therefore I will make no changes in his rate control medication metoprolol.  Blood pressure today is well controlled at 118/60.  Regarding his diabetes, I will check a hemoglobin A1c.  Given his advanced age, anything below 7.5 is acceptable.  His last hemoglobin A1c was below 6.  Therefore I believe he is effectively diet-controlled.  I will also monitor his potassium and renal function closely.  It is  difficult to manage accurately given the fact the patient "adjusts" his medication independently.  However if his potassium and renal function is stable, I would make no changes in his dose at this time.  There is no evidence of fluid overload or pulmonary edema.  01/15/18 Unfortunately was recently admitted.  I have copied the discharge summary below for reference: Admission date: 01/01/2018 Discharge Date 01/03/2018 Primary MD [name], MD Admitting Physician [name], MD  Admission Diagnosis  Elevated troponin [R74.8] AKI (acute kidney injury) ([place]) [N17.9] Constipation, unspecified constipation type [K59.00]  Discharge Diagnosis   Active Problems: Acute on chronic kidney injury Elevated troponin due to demand ischemia Pancreatitis Recurrent Hepatitis Constipation  Hospital Course  The patient with extensive past medical history most significant for coronary artery disease status post MI and and stent placement, chronic diastolic heart failure as well as significant mitral and tricuspid regurg that is post minimally invasive repair, and chronic subdural hematoma presents to the emergency department complaining of constipation and weakness. Patient had CT abdomen which showed pancreatic cyst pancreatic cyst but no other abnormality.  Patient's abdominal pain resolved after bowel movements.  He is very anxious to go home.  Patient also was noted to have elevated troponin felt to be due to demand ischemia I recommended he follow-up with his cardiologist.   ImagingResults  Ct Abdomen  Pelvis W Wo Contrast  Result Date: 01/02/2018 CLINICAL DATA:  Unintended weight loss, abdominal pain. EXAM: CT ABDOMEN AND PELVIS WITHOUT AND WITH CONTRAST TECHNIQUE: Multidetector CT imaging of the abdomen and pelvis was performed following the standard protocol before and following the bolus administration of intravenous contrast. CONTRAST:  84mL ISOVUE-300 IOPAMIDOL (ISOVUE-300)  INJECTION 61% COMPARISON:  MR abdomen 07/04/2016 and CT abdomen pelvis 08/02/2015. FINDINGS: Lower chest: Small right pleural effusion with collapse/consolidation in the adjacent right lower lobe. Pleural calcifications on the right with calcified granulomas. Image quality is degraded by respiratory motion. Heart is moderately enlarged. Atherosclerotic calcification of the arterial vasculature, including coronary arteries and aortic bowel. Pulmonary arteries are enlarged. No pericardial effusion. Hepatobiliary: Liver margin is irregular in the liver appears shrunken. Cholecystectomy. No biliary ductal dilatation. Pancreas: Low-attenuation lesions in the tail of the pancreas measure up to 12 mm, similar to 07/04/2016. No ductal dilatation or gland atrophy. Spleen: Negative. Adrenals/Urinary Tract: Right adrenal gland is unremarkable. Thickening of the body of the left adrenal gland. Subcentimeter low-attenuation lesions in the kidneys are too small to characterize. A hyperdense lesion of the posterior interpolar left kidney measures 1.6 cm, characterized as a hemorrhagic cyst on 07/04/2016. Ureters are decompressed. Prostate indents the bladder. A diverticulum is seen off the right lateral wall of the bladder. Stomach/Bowel: Stomach, small bowel, appendix and colon are unremarkable. Vascular/Lymphatic: Atherosclerotic calcification of the arterial vasculature without abdominal aortic aneurysm. No pathologically enlarged lymph nodes. Reproductive: Prostate is enlarged slightly. Other: No free fluid.  Mesenteries and peritoneum are unremarkable. Musculoskeletal: Degenerative changes in the spine and hips. No worrisome lytic or sclerotic lesions. Old rib fractures. IMPRESSION: 1. No findings to explain the patient's symptoms. 2. Small right pleural effusion with collapse/consolidation in the adjacent right lower lobe. Follow-up to clearing is recommended to exclude malignancy. 3.  Aortic atherosclerosis (ICD10-170.0).  4. Enlarged pulmonary arteries, indicative of pulmonary arterial hypertension. 5. Cirrhosis. 6. Low-attenuation lesions in the tail the pancreas are grossly stable from 07/04/2016 and may represent pseudocysts. Cystic pancreatic neoplasm cannot be excluded. Follow-up imaging in 2 years is recommended to ensure stability. This recommendation follows ACR consensus guidelines: Management of Incidental Pancreatic Cysts: A White Paper of the ACR Incidental Findings Committee. [place] 9924;26:[id number]. 7. Enlarged prostate. Electronically Signed   By: Lorin Picket M.D.   On: 01/02/2018 14:22   Dg Abdomen Acute W/chest  Result Date: 01/01/2018 CLINICAL DATA:  Constipation, weakness, cough EXAM: DG ABDOMEN ACUTE W/ 1V CHEST COMPARISON:  11/10/2016 FINDINGS: Small right pleural effusion. Associated right lower lobe opacity, likely atelectasis. Left lung is clear. No pneumothorax. Stable right rib fractures, likely involving the right posterolateral 3rd through 7th ribs. Nonobstructive bowel gas pattern. No evidence of free air under the diaphragm on the upright view. Cholecystectomy clips. Degenerative changes of the thoracolumbar spine. IMPRESSION: Small right pleural effusion. Associated right lower lobe opacity, likely atelectasis. Stable right rib fractures.  No pneumothorax. No evidence of small bowel obstruction or free air. Electronically Signed   By: Julian Hy M.D.   On: 01/01/2018 23:48     01/15/18 I have reviewed the patient's hospital records.  Since I last saw the patient, he has lost approximately 6 pounds.  He states that he is lost 10 pounds and he is gained for back since leaving the hospital.  His wife states that he is not eating well.  She states that he would just pick at his food and eat very little.  He states  that he has trouble swallowing the food and that is why he does not eat very well.  However he states that this is been going on for years and that there has been no  sudden change.  He denies any chest pain.  He denies any shortness of breath.  He is still taking 80 mg of Lasix twice a day.  He does this the majority the time.  Occasionally he will reduce his evening dose to 40 mg a day if he feels that he is losing too much weight or if his swelling is down substantially to avoid dehydration.  This current strategy has worked well for him.  His creatinine during his hospitalization was mildly elevated at 1.6.  The patient's baseline is anywhere from 1.4-1.7.  I did review the CT scan which showed stable pancreatic cyst.  They recommended a repeat CT scan in 2 years to ensure stability.  We discussed this today with the patient he is 84 years old.  I question the utility of checking another CT scan when he is 67 given the fact he would be unable to tolerate surgery if they were cancerous.  Therefore we will discuss this in the future whether or not he wants to repeat imaging in over a year.  It also showed stable rib fractures as well as cirrhosis.  Otherwise there were no acute findings.  Patient states he feels better and is starting to get his strength back.  Wt Readings from Last 3 Encounters:  01/15/18 198 lb (89.8 kg)  01/13/18 194 lb 1.6 oz (88 kg)  01/03/18 201 lb 14.4 oz (91.6 kg)     Past Medical History:  Diagnosis Date  . Atelectasis of right lung   . Chronic diastolic (congestive) heart failure ([place])    a. 10/2013 Echo: EF 55-60%, basal-mid anteroseptal and basal-mid inferoseptal HK; b. 02/2017 Echo: EF 60-65%, Gr3 DD.  Marland Kitchen Chronic fatigue   . Chronic subdural hematoma ([place])    a. 12/2015 Head CT: chronic right holo-hemispheric SDH w/o midline shift.  . CKD (chronic kidney disease), stage III ([place])   . Coronary artery disease    a. 11/2014 NSTEMI: DESx 2 placed to LAD and RCA; b. 08/2015 Cath: LAD patent stent, LCX 52m, RCA patent stent, 29m.  . Diabetes mellitus, type II ([place])    a. HgA1c 6.8 in 03/2015  . ED (erectile dysfunction)   . Gout   .  Hemangioma of liver 08/02/2015   12 mm enhancing lesion seen on CT - suspicious for benign hemangioma but not diagnostic - MRI recommended  . HLD (hyperlipidemia)   . Hypertension   . Hypertensive heart disease   . Ischemic finger ulcer ([place])    a. 03/2017 Seen for digital ulcers - felt to be embolic in setting of PAF. Pt on coumadin.  . Kidney stones   . Lower extremity edema    a. chronic  . OA (osteoarthritis)   . Orthostatic hypotension   . PAF (paroxysmal atrial fibrillation) ([place])    a. 10/2015 s/p DCCV;  b. CHA2DS2VASc = 6--> on coumadin; c. 10/2015 Echo: sev dil LA.  . Prostate cancer ([place])   . Pulmonary HTN ([place])   . Pulmonary hypertension ([place])    a. 10/2015 Echo: PASP 77mmHg.  Marland Kitchen Renal insufficiency   . Severe mitral regurgitation    a.  10/2015 s/p minimally invasive MV repair; b. 10/2015 Echo: mod MS, mean grad 41mmHg; c. 02/2017 Echo: EF 60-65%, Gr3 DD, mod Ca2+  MV annulus, mod to sev dil LA, mod dil RA, mod TR. PASP 19mmHg.  Marland Kitchen Spinal stenosis   . SVT (supraventricular tachycardia) ([facility])    a. recurrent, usually responsive to vagal manuevers  . Tricuspid regurgitation    a. 10/2015 Echo: mild to mod TR.   Past Surgical History:  Procedure Laterality Date  . BACK SURGERY    . Back surgery x 2    . CARDIAC CATHETERIZATION N/A 08/08/2015   Procedure: Right/Left Heart Cath and Coronary Angiography;  Surgeon: Sherren Mocha, MD;  Location: [facility];  Service: Cardiovascular;  Laterality: N/A;  . CARDIAC SURGERY     2 cardiac stents  . CARDIOVERSION N/A 10/20/2015   Procedure: CARDIOVERSION;  Surgeon: Pixie Casino, MD;  Location: [facility];  Service: Cardiovascular;  Laterality: N/A;  . CERVICAL SPINE SURGERY    . CHOLECYSTECTOMY N/A 07/08/2016   Procedure: LAPAROSCOPIC CHOLECYSTECTOMY WITH INTRAOPERATIVE CHOLANGIOGRAM;  Surgeon: Georganna Skeans, MD;  Location: [facility];  Service: General;  Laterality: N/A;  . EYE SURGERY    . INTRAOPERATIVE TRANSESOPHAGEAL  ECHOCARDIOGRAM  10/05/2015   Procedure: INTRAOPERATIVE TRANSESOPHAGEAL ECHOCARDIOGRAM;  Surgeon: Rexene Alberts, MD;  Location: [facility];  Service: Open Heart Surgery;;  . JOINT REPLACEMENT     shoulder  . LEFT HEART CATHETERIZATION WITH CORONARY ANGIOGRAM N/A 11/16/2014   Procedure: LEFT HEART CATHETERIZATION WITH CORONARY ANGIOGRAM;  Surgeon: Troy Sine, MD;  [facility];  Service: Cardiovascular;  Laterality: N/A;  . MITRAL VALVE REPAIR Right 10/04/2015   Procedure: MINIMALLY INVASIVE MITRAL VALVE REPAIR (MVR);  Surgeon: Rexene Alberts, MD;  Location: [facility];  Service: Open Heart Surgery;  Laterality: Right;  . Rt rotator cuff repair    . TEE WITHOUT CARDIOVERSION N/A 06/22/2015   Procedure: TRANSESOPHAGEAL ECHOCARDIOGRAM (TEE);  Surgeon: Skeet Latch, MD;  Location: [facility];  Service: Cardiovascular;  Laterality: N/A;  . TEE WITHOUT CARDIOVERSION N/A 10/04/2015   Procedure: TRANSESOPHAGEAL ECHOCARDIOGRAM (TEE);  Surgeon: Rexene Alberts, MD;  Location: [facility];  Service: Open Heart Surgery;  Laterality: N/A;  . WOUND EXPLORATION N/A 10/05/2015   Procedure: Sternal Incision;  Surgeon: Rexene Alberts, MD;  Location: [facility];  Service: Open Heart Surgery;  Laterality: N/A;   Current Outpatient Medications on File Prior to Visit  Medication Sig Dispense Refill  . allopurinol (ZYLOPRIM) 100 MG tablet TAKE ONE (1) TABLET EACH DAY 90 tablet 3  . colchicine 0.6 MG tablet Take 1 tablet (0.6 mg total) by mouth daily. 30 tablet 5  . docusate sodium (COLACE) 100 MG capsule Take 1 capsule (100 mg total) by mouth daily as needed for moderate constipation. 60 capsule 2  . finasteride (PROSCAR) 5 MG tablet TAKE ONE (1) TABLET BY MOUTH EVERY DAY 90 tablet 3  . furosemide (LASIX) 40 MG tablet Take 2 tablets (80 mg total) by mouth 2 (two) times daily. 120 tablet 2  . magnesium oxide (MAG-OX) 400 MG tablet Take 1 tablet (400 mg total) by mouth daily. 30 tablet 0  . metoprolol tartrate (LOPRESSOR) 25  MG tablet TAKE ONE-HALF TABLET BY MOUTH TWICE DAILY 90 tablet 3  . OVER THE COUNTER MEDICATION [place]: Take 1 capsule by mouth once a day for immune health    . polyethylene glycol (MIRALAX) packet Take 17 g by mouth daily. 30 each 0  . potassium chloride (KLOR-CON) 20 MEQ packet Take 40 mEq by mouth daily. 60 packet 0  . pravastatin (PRAVACHOL) 40 MG tablet TAKE ONE (1)  TABLET EACH DAY AT 6 P.M. 30 tablet 9  . spironolactone (ALDACTONE) 25 MG tablet Take 1 tablet (25 mg total) by mouth daily. 30 tablet 1  . traMADol (ULTRAM) 50 MG tablet Take 1 tablet (50 mg total) by mouth every 6 (six) hours as needed for moderate pain. 30 tablet 0  . warfarin (COUMADIN) 4 MG tablet Take 1 tablet (4 mg total) by mouth daily. Do not take with any other warfarin unless Dr. Dennard Schaumann instructs. (Patient taking differently: Currently taking: 2MG  (Monday & Friday morning) and 4MG  (all other mornings)) 30 tablet 3  . Wheat Dextrin (BENEFIBER) POWD Take by mouth See admin instructions. Mix one teaspoonful into 6-8 ounces of fluid/milk and drink once daily     No current facility-administered medications on file prior to visit.    No Known Allergies Social History   Socioeconomic History  . Marital status: Married    Spouse name: Not on file  . Number of children: 4  . Years of education: Not on file  . Highest education level: Not on file  Occupational History    Employer: RETIRED  Social Needs  . Financial resource strain: Not on file  . Food insecurity:    Worry: Not on file    Inability: Not on file  . Transportation needs:    Medical: Not on file    Non-medical: Not on file  Tobacco Use  . Smoking status: Former Smoker    Types: Cigarettes, Pipe, Landscape architect  . Smokeless tobacco: Former Systems developer    Types: Chew  . Tobacco comment: QUIT SMOKING  MANY YEARS AGO"  Substance and Sexual Activity  . Alcohol use: No  . Drug use: No  . Sexual activity: Not on file  Lifestyle  . Physical activity:    Days  per week: Not on file    Minutes per session: Not on file  . Stress: Not on file  Relationships  . Social connections:    Talks on phone: Not on file    Gets together: Not on file    Attends religious service: Not on file    Active member of club or organization: Not on file    Attends meetings of clubs or organizations: Not on file    Relationship status: Not on file  . Intimate partner violence:    Fear of current or ex partner: Not on file    Emotionally abused: Not on file    Physically abused: Not on file    Forced sexual activity: Not on file  Other Topics Concern  . Not on file  Social History Narrative   ** Merged History Encounter **       Four adopted children.  Lives alone.       Review of Systems  All other systems reviewed and are negative.      Objective:   Physical Exam  Constitutional: He appears well-developed and well-nourished.  Cardiovascular: Normal rate.  Murmur heard. Pulmonary/Chest: Effort normal. He has no wheezes. He has no rhonchi.  Abdominal: Soft. Bowel sounds are normal. He exhibits no distension. There is no tenderness. There is no rebound.  Musculoskeletal: He exhibits no edema.  Vitals reviewed.         Assessment & Plan:  Chronic systolic congestive heart failure (HCC)  PAF (paroxysmal atrial fibrillation) ([place])  Controlled type 2 diabetes mellitus without complication, without long-term current use of insulin [facility])  Hospital discharge follow-up   Patient has no peripheral edema on exam today.  He appears to be euvolemic.  This is the lowest his weight has been since I have seen him.  Therefore I believe he is suffering from protein calorie malnutrition.  I have recommended that he add a can of Ensure every day to supplement his oral intake I will check his INR today.  Goal INR is between 2 and 2.5 for this patient.  His heart rate is well controlled.  There is no evidence of fluid overload.  Monitor a CBC as well as a CMP to  monitor his potassium and renal function.  I will continue Lasix at 80 mg twice a day and have encouraged the patient to monitor his weight daily and adjust his Lasix as he has been doing if he loses weight or if he appears dehydrated.  Recheck the patient in 6 weeks or sooner if needed

## 2018-01-16 LAB — CBC WITH DIFFERENTIAL/PLATELET
BASOS ABS: 87 {cells}/uL (ref 0–200)
Basophils Relative: 1.1 %
EOS PCT: 5.4 %
Eosinophils Absolute: 427 cells/uL (ref 15–500)
HEMATOCRIT: 45.2 % (ref 38.5–50.0)
Hemoglobin: 15.2 g/dL (ref 13.2–17.1)
LYMPHS ABS: 2520 {cells}/uL (ref 850–3900)
MCH: 30.9 pg (ref 27.0–33.0)
MCHC: 33.6 g/dL (ref 32.0–36.0)
MCV: 91.9 fL (ref 80.0–100.0)
MPV: 10.9 fL (ref 7.5–12.5)
Monocytes Relative: 7.2 %
NEUTROS PCT: 54.4 %
Neutro Abs: 4298 cells/uL (ref 1500–7800)
Platelets: 144 10*3/uL (ref 140–400)
RBC: 4.92 10*6/uL (ref 4.20–5.80)
RDW: 14.2 % (ref 11.0–15.0)
Total Lymphocyte: 31.9 %
WBC mixed population: 569 cells/uL (ref 200–950)
WBC: 7.9 10*3/uL (ref 3.8–10.8)

## 2018-01-16 LAB — COMPLETE METABOLIC PANEL WITH GFR
AG RATIO: 0.9 (calc) — AB (ref 1.0–2.5)
ALKALINE PHOSPHATASE (APISO): 144 U/L — AB (ref 40–115)
ALT: 24 U/L (ref 9–46)
AST: 42 U/L — AB (ref 10–35)
Albumin: 3.3 g/dL — ABNORMAL LOW (ref 3.6–5.1)
BILIRUBIN TOTAL: 1 mg/dL (ref 0.2–1.2)
BUN/Creatinine Ratio: 22 (calc) (ref 6–22)
BUN: 26 mg/dL — ABNORMAL HIGH (ref 7–25)
CHLORIDE: 97 mmol/L — AB (ref 98–110)
CO2: 27 mmol/L (ref 20–32)
CREATININE: 1.18 mg/dL — AB (ref 0.70–1.11)
Calcium: 9.4 mg/dL (ref 8.6–10.3)
GFR, Est African American: 64 mL/min/{1.73_m2} (ref >=60)
GFR, Est Non African American: 56 mL/min/{1.73_m2} — ABNORMAL LOW (ref >=60)
GLOBULIN: 3.5 g/dL (ref 1.9–3.7)
Glucose, Bld: 92 mg/dL (ref 65–99)
POTASSIUM: 4.3 mmol/L (ref 3.5–5.3)
SODIUM: 136 mmol/L (ref 135–146)
Total Protein: 6.8 g/dL (ref 6.1–8.1)

## 2018-01-19 ENCOUNTER — Ambulatory Visit: Payer: Self-pay | Admitting: *Deleted

## 2018-01-22 ENCOUNTER — Encounter: Payer: Self-pay | Admitting: Family Medicine

## 2018-02-06 ENCOUNTER — Other Ambulatory Visit: Payer: Self-pay | Admitting: Family Medicine

## 2018-02-24 ENCOUNTER — Ambulatory Visit
Admission: RE | Admit: 2018-02-24 | Discharge: 2018-02-24 | Disposition: A | Discharge status: Home / Self Care | Payer: Medicare Other | Source: Ambulatory Visit | Attending: Family Medicine | Admitting: Family Medicine

## 2018-02-24 ENCOUNTER — Ambulatory Visit (INDEPENDENT_AMBULATORY_CARE_PROVIDER_SITE_OTHER): Payer: Medicare Other | Admitting: Family Medicine

## 2018-02-24 ENCOUNTER — Encounter: Payer: Self-pay | Admitting: Family Medicine

## 2018-02-24 ENCOUNTER — Other Ambulatory Visit: Payer: Self-pay | Admitting: Family Medicine

## 2018-02-24 VITALS — BP 100/50 | HR 64 | Temp 97.2°F | Resp 14 | Ht 67.0 in | Wt 196.0 lb

## 2018-02-24 DIAGNOSIS — M542 Cervicalgia: Secondary | ICD-10-CM | POA: Diagnosis not present

## 2018-02-24 DIAGNOSIS — R1310 Dysphagia, unspecified: Secondary | ICD-10-CM | POA: Diagnosis not present

## 2018-02-24 DIAGNOSIS — R05 Cough: Secondary | ICD-10-CM

## 2018-02-24 DIAGNOSIS — R059 Cough, unspecified: Secondary | ICD-10-CM

## 2018-02-24 DIAGNOSIS — R7309 Other abnormal glucose: Secondary | ICD-10-CM | POA: Diagnosis not present

## 2018-02-24 MED ORDER — CYCLOBENZAPRINE HCL 5 MG PO TABS: 5.0000 mg | ORAL_TABLET | Freq: Three times a day (TID) | ORAL | 0 refills | Status: DC | PRN

## 2018-02-24 NOTE — Progress Notes (Signed)
Subjective:    Patient ID: [name], male    DOB: October 14, 1928, 84 y.o.   MRN: [id number]  HPI   Wt Readings from Last 3 Encounters:  02/24/18 196 lb (88.9 kg)  01/15/18 198 lb (89.8 kg)  01/13/18 194 lb 1.6 oz (88 kg)   Patient presents today with his wife with several concerns.  His primary concern is pain in his neck.  He states is been there for 1 week.  Is located at the base of his occiput.  It is most tender along the sides in the body of the trapezius muscle.  Palpation elicits pain in this area just posterior to the ears and below the mastoid process.  He denies any neuropathy in his arms, weakness in his arms, numbness or weakness in his legs.  He denies any falls or neck injuries.  Patient appears to have muscle spasms and stiffness in his neck with decreased range of motion.  His wife is primarily concerned that his decline in overall health.  She states that he has stopped eating.  She states that he has very little appetite.  She states that he is constantly getting choked on his food as he swallows.  He states that he is having trouble swallowing pills.  He states they will frequently get lodged in his throat.  Because of this reason he is eating less and less and becoming weaker and weaker.  He denies any hemoptysis.  He denies any food sticking on swallowing although he does report that pills will occasionally stick.  He denies any melena or hematochezia.  Patient saw my partner for this last year and had a modified barium swallow.  The results of the barium swallow are listed below: FINDINGS: Thin liquid- deep laryngeal penetration with trace tracheal aspiration which clears with a normal cough reflex.  Honey- laryngeal penetration with clearing with coughing.  Pure- laryngeal penetration with clearing with coughing.  Pure with cracker- laryngeal penetration with clearing with coughing.  Barium tablet with applesauce -  within normal limits Third issue is he has  been coughing more recently.  He denies any fevers and chills although he does feel weak and lethargic.  He does have some faint left basilar crackles heard posteriorly.  Cough is productive of clear and white sputum  Past Medical History:  Diagnosis Date  . Atelectasis of right lung   . Chronic diastolic (congestive) heart failure ([place])    a. 10/2013 Echo: EF 55-60%, basal-mid anteroseptal and basal-mid inferoseptal HK; b. 02/2017 Echo: EF 60-65%, Gr3 DD.  Marland Kitchen Chronic fatigue   . Chronic subdural hematoma ([place])    a. 12/2015 Head CT: chronic right holo-hemispheric SDH w/o midline shift.  . CKD (chronic kidney disease), stage III ([place])   . Coronary artery disease    a. 11/2014 NSTEMI: DESx 2 placed to LAD and RCA; b. 08/2015 Cath: LAD patent stent, LCX 45m, RCA patent stent, 97m.  . Diabetes mellitus, type II ([place])    a. HgA1c 6.8 in 03/2015  . ED (erectile dysfunction)   . Gout   . Hemangioma of liver 08/02/2015   12 mm enhancing lesion seen on CT - suspicious for benign hemangioma but not diagnostic - MRI recommended  . HLD (hyperlipidemia)   . Hypertension   . Hypertensive heart disease   . Ischemic finger ulcer ([place])    a. 03/2017 Seen for digital ulcers - felt to be embolic in setting of PAF. Pt on coumadin.  Marland Kitchen  Kidney stones   . Lower extremity edema    a. chronic  . OA (osteoarthritis)   . Orthostatic hypotension   . PAF (paroxysmal atrial fibrillation) ([place])    a. 10/2015 s/p DCCV;  b. CHA2DS2VASc = 6--> on coumadin; c. 10/2015 Echo: sev dil LA.  . Prostate cancer ([place])   . Pulmonary HTN ([place])   . Pulmonary hypertension ([place])    a. 10/2015 Echo: PASP 48mmHg.  Marland Kitchen Renal insufficiency   . Severe mitral regurgitation    a.  10/2015 s/p minimally invasive MV repair; b. 10/2015 Echo: mod MS, mean grad 21mmHg; c. 02/2017 Echo: EF 60-65%, Gr3 DD, mod Ca2+ MV annulus, mod to sev dil LA, mod dil RA, mod TR. PASP 65mmHg.  Marland Kitchen Spinal stenosis   . SVT (supraventricular tachycardia) ([facility])    a.  recurrent, usually responsive to vagal manuevers  . Tricuspid regurgitation    a. 10/2015 Echo: mild to mod TR.   Past Surgical History:  Procedure Laterality Date  . BACK SURGERY    . Back surgery x 2    . CARDIAC CATHETERIZATION N/A 08/08/2015   Procedure: Right/Left Heart Cath and Coronary Angiography;  Surgeon: Sherren Mocha, MD;  Location: [facility];  Service: Cardiovascular;  Laterality: N/A;  . CARDIAC SURGERY     2 cardiac stents  . CARDIOVERSION N/A 10/20/2015   Procedure: CARDIOVERSION;  Surgeon: Pixie Casino, MD;  Location: [facility];  Service: Cardiovascular;  Laterality: N/A;  . CERVICAL SPINE SURGERY    . CHOLECYSTECTOMY N/A 07/08/2016   Procedure: LAPAROSCOPIC CHOLECYSTECTOMY WITH INTRAOPERATIVE CHOLANGIOGRAM;  Surgeon: Georganna Skeans, MD;  Location: [facility];  Service: General;  Laterality: N/A;  . EYE SURGERY    . INTRAOPERATIVE TRANSESOPHAGEAL ECHOCARDIOGRAM  10/05/2015   Procedure: INTRAOPERATIVE TRANSESOPHAGEAL ECHOCARDIOGRAM;  Surgeon: Rexene Alberts, MD;  Location[facility] OR;  Service: Open Heart Surgery;;  . JOINT REPLACEMENT     shoulder  . LEFT HEART CATHETERIZATION WITH CORONARY ANGIOGRAM N/A 11/16/2014   Procedure: LEFT HEART CATHETERIZATION WITH CORONARY ANGIOGRAM;  Surgeon: Troy Sine, MD;  Location: [facility] CATH LAB;  Service: Cardiovascular;  Laterality: N/A;  . MITRAL VALVE REPAIR Right 10/04/2015   Procedure: MINIMALLY INVASIVE MITRAL VALVE REPAIR (MVR);  Surgeon: Rexene Alberts, MD;  Location: [facility];  Service: Open Heart Surgery;  Laterality: Right;  . Rt rotator cuff repair    . TEE WITHOUT CARDIOVERSION N/A 06/22/2015   Procedure: TRANSESOPHAGEAL ECHOCARDIOGRAM (TEE);  Surgeon: Skeet Latch, MD;  Location: [facility];  Service: Cardiovascular;  Laterality: N/A;  . TEE WITHOUT CARDIOVERSION N/A 10/04/2015   Procedure: TRANSESOPHAGEAL ECHOCARDIOGRAM (TEE);  Surgeon: Rexene Alberts, MD;  Location: [facility];  Service: Open Heart Surgery;  Laterality:  N/A;  . WOUND EXPLORATION N/A 10/05/2015   Procedure: Sternal Incision;  Surgeon: Rexene Alberts, MD;  Location: [facility];  Service: Open Heart Surgery;  Laterality: N/A;   Current Outpatient Medications on File Prior to Visit  Medication Sig Dispense Refill  . allopurinol (ZYLOPRIM) 100 MG tablet TAKE ONE (1) TABLET EACH DAY 90 tablet 2  . docusate sodium (COLACE) 100 MG capsule Take 1 capsule (100 mg total) by mouth daily as needed for moderate constipation. 60 capsule 2  . finasteride (PROSCAR) 5 MG tablet TAKE ONE (1) TABLET BY MOUTH EVERY DAY 90 tablet 3  . furosemide (LASIX) 40 MG tablet Take 2 tablets (80 mg total) by mouth 2 (two) times daily. 120 tablet 2  . magnesium oxide (MAG-OX)  400 MG tablet Take 1 tablet (400 mg total) by mouth daily. 30 tablet 0  . metoprolol tartrate (LOPRESSOR) 25 MG tablet TAKE ONE-HALF TABLET BY MOUTH TWICE DAILY 90 tablet 3  . OVER THE COUNTER MEDICATION Neuroquell: Take 1 capsule by mouth once a day for immune health    . polyethylene glycol (MIRALAX) packet Take 17 g by mouth daily. 30 each 0  . potassium chloride (KLOR-CON) 20 MEQ packet Take 40 mEq by mouth daily. 60 packet 0  . pravastatin (PRAVACHOL) 40 MG tablet TAKE ONE (1) TABLET EACH DAY AT 6 P.M. 30 tablet 9  . spironolactone (ALDACTONE) 25 MG tablet Take 1 tablet (25 mg total) by mouth daily. 30 tablet 1  . traMADol (ULTRAM) 50 MG tablet Take 1 tablet (50 mg total) by mouth every 6 (six) hours as needed for moderate pain. 30 tablet 0  . warfarin (COUMADIN) 4 MG tablet Take 1 tablet (4 mg total) by mouth daily. Do not take with any other warfarin unless Dr. Dennard Schaumann instructs. (Patient taking differently: Currently taking: 2MG  (Monday & Friday morning) and 4MG  (all other mornings)) 30 tablet 3  . Wheat Dextrin (BENEFIBER) POWD Take by mouth See admin instructions. Mix one teaspoonful into 6-8 ounces of fluid/milk and drink once daily    . colchicine 0.6 MG tablet Take 1 tablet (0.6 mg total) by mouth  daily. (Patient not taking: Reported on 01/15/2018) 30 tablet 5   No current facility-administered medications on file prior to visit.    No Known Allergies Social History   Socioeconomic History  . Marital status: Married    Spouse name: Not on file  . Number of children: 4  . Years of education: Not on file  . Highest education level: Not on file  Occupational History    Employer: RETIRED  Social Needs  . Financial resource strain: Not on file  . Food insecurity:    Worry: Not on file    Inability: Not on file  . Transportation needs:    Medical: Not on file    Non-medical: Not on file  Tobacco Use  . Smoking status: Former Smoker    Types: Cigarettes, Pipe, Landscape architect  . Smokeless tobacco: Former Systems developer    Types: Chew  . Tobacco comment: QUIT SMOKING  MANY YEARS AGO"  Substance and Sexual Activity  . Alcohol use: No  . Drug use: No  . Sexual activity: Not on file  Lifestyle  . Physical activity:    Days per week: Not on file    Minutes per session: Not on file  . Stress: Not on file  Relationships  . Social connections:    Talks on phone: Not on file    Gets together: Not on file    Attends religious service: Not on file    Active member of club or organization: Not on file    Attends meetings of clubs or organizations: Not on file    Relationship status: Not on file  . Intimate partner violence:    Fear of current or ex partner: Not on file    Emotionally abused: Not on file    Physically abused: Not on file    Forced sexual activity: Not on file  Other Topics Concern  . Not on file  Social History Narrative   ** Merged History Encounter **       Four adopted children.  Lives alone.       Review of Systems  All other systems reviewed  and are negative.      Objective:   Physical Exam  Constitutional: He appears well-developed and well-nourished.  Cardiovascular: Normal rate.  Murmur heard. Pulmonary/Chest: Effort normal. He has no wheezes. He has no  rhonchi.  Abdominal: Soft. Bowel sounds are normal. He exhibits no distension. There is no tenderness. There is no rebound.  Musculoskeletal: He exhibits no edema.  Vitals reviewed.         Assessment & Plan:  Cough - Plan: DG Chest 2 View, CBC with Differential/Platelet, BASIC METABOLIC PANEL WITH GFR  Neck pain - Plan: DG Cervical Spine Complete  Dysphagia, unspecified type - Plan: Ambulatory referral to Gastroenterology  First regarding his neck, I believe he has cervical paraspinal muscle spasms.  I will obtain an x-ray of the cervical spine to evaluate further.  I will likely treat this with muscle relaxers given the fact he is on Coumadin.  Second issue is the dysphasia.  I recommended a GI consultation for EGD to rule out stricture.  I have recommended increasing Ensure to 3 cans a day with meals to prevent further weight loss.  Regarding his cough, I have recommended a chest x-ray to rule out an early pneumonia in the left lower lobe versus aspiration pneumonia particular given his productive cough.  Also obtain baseline lab work including a CBC as well as a BMP to evaluate for dehydration, worsening chronic kidney disease or hyperkalemia

## 2018-02-26 ENCOUNTER — Other Ambulatory Visit: Payer: Self-pay

## 2018-02-26 ENCOUNTER — Ambulatory Visit: Payer: Medicare Other | Admitting: Family Medicine

## 2018-02-26 ENCOUNTER — Ambulatory Visit: Payer: Medicare Other | Admitting: Gastroenterology

## 2018-02-26 VITALS — BP 96/62 | HR 61 | Ht 67.0 in | Wt 192.7 lb

## 2018-02-26 DIAGNOSIS — R1310 Dysphagia, unspecified: Secondary | ICD-10-CM

## 2018-02-26 NOTE — Progress Notes (Signed)
[name] MD, MRCP(U.K) [address]  [place]  [place],  [place] [address]  Main: [phone]  Fax: [phone]   Gastroenterology Consultation  Referring Provider:     [name], MD Primary Care Physician:  [name], MD Primary Gastroenterologist:  Dr. [name]  Reason for Consultation:     Dysphagia         HPI:   [name] is a 84 y.o. y/o male referred for consultation & management  by Dr. Dennard Schaumann, Cammie Mcgee, MD.    She has been referred for dysphagia.  She has been seen by a GI back in 2013 for dysphagia.  At that point of time there was concern for food impaction with apple.  EGD in 2007 showed H. pylori gastritis.  Pyloric channel ulcer with a clean base.  Do see a mention about a plan for an EGD at the point of time but cannot see any report clearly.  I do note I made modified barium swallow in July 2018.  He demonstrated deep laryngeal penetration with trace tracheal aspiration with clears with a normal cough reflux on thin liquids with honey consistency laryngeal penetration with clearing with coughing and with pured consistency laryngeal penetration with clearing and coughing barium tablet with applesauce was within normal limits.  A barium esophagogram was performed in June 2018.  There was a laryngeal penetration of the barium is prompted only of mild cough reflex.     Dysphagia: Onset and any progression: He says that it began 3 years back , getting worse  Frequency: does not get stuck with food, only pills, does not cough much he eats Foods affected : pills  Prior episodes of impaction: no  History of asthma/allergy : no  History of heartburn/Reflux : no  Weight loss/weight gain : lost a lot of weight   Prior EGD: some years back  PPI/H2 blocker use : no     Past Medical History:  Diagnosis Date  . Atelectasis of right lung   . Chronic diastolic (congestive) heart failure ([place])    a. 10/2013 Echo: EF 55-60%, basal-mid  anteroseptal and basal-mid inferoseptal HK; b. 02/2017 Echo: EF 60-65%, Gr3 DD.  Marland Kitchen Chronic fatigue   . Chronic subdural hematoma ([place])    a. 12/2015 Head CT: chronic right holo-hemispheric SDH w/o midline shift.  . CKD (chronic kidney disease), stage III ([place])   . Coronary artery disease    a. 11/2014 NSTEMI: DESx 2 placed to LAD and RCA; b. 08/2015 Cath: LAD patent stent, LCX 84m, RCA patent stent, 62m.  . Diabetes mellitus, type II ([place])    a. HgA1c 6.8 in 03/2015  . ED (erectile dysfunction)   . Gout   . Hemangioma of liver 08/02/2015   12 mm enhancing lesion seen on CT - suspicious for benign hemangioma but not diagnostic - MRI recommended  . HLD (hyperlipidemia)   . Hypertension   . Hypertensive heart disease   . Ischemic finger ulcer ([place])    a. 03/2017 Seen for digital ulcers - felt to be embolic in setting of PAF. Pt on coumadin.  . Kidney stones   . Lower extremity edema    a. chronic  . OA (osteoarthritis)   . Orthostatic hypotension   . PAF (paroxysmal atrial fibrillation) ([place])    a. 10/2015 s/p DCCV;  b. CHA2DS2VASc = 6--> on coumadin; c. 10/2015 Echo: sev dil LA.  . Prostate cancer ([place])   . Pulmonary HTN ([place])   .  Pulmonary hypertension ([place])    a. 10/2015 Echo: PASP 25mmHg.  Marland Kitchen Renal insufficiency   . Severe mitral regurgitation    a.  10/2015 s/p minimally invasive MV repair; b. 10/2015 Echo: mod MS, mean grad 48mmHg; c. 02/2017 Echo: EF 60-65%, Gr3 DD, mod Ca2+ MV annulus, mod to sev dil LA, mod dil RA, mod TR. PASP 82mmHg.  Marland Kitchen Spinal stenosis   . SVT (supraventricular tachycardia) ([facility])    a. recurrent, usually responsive to vagal manuevers  . Tricuspid regurgitation    a. 10/2015 Echo: mild to mod TR.    Past Surgical History:  Procedure Laterality Date  . BACK SURGERY    . Back surgery x 2    . CARDIAC CATHETERIZATION N/A 08/08/2015   Procedure: Right/Left Heart Cath and Coronary Angiography;  Surgeon: Sherren Mocha, MD;  Location: [facility];  Service:  Cardiovascular;  Laterality: N/A;  . CARDIAC SURGERY     2 cardiac stents  . CARDIOVERSION N/A 10/20/2015   Procedure: CARDIOVERSION;  Surgeon: Pixie Casino, MD;  Location: [facility];  Service: Cardiovascular;  Laterality: N/A;  . CERVICAL SPINE SURGERY    . CHOLECYSTECTOMY N/A 07/08/2016   Procedure: LAPAROSCOPIC CHOLECYSTECTOMY WITH INTRAOPERATIVE CHOLANGIOGRAM;  Surgeon: Georganna Skeans, MD;  Location: [facility];  Service: General;  Laterality: N/A;  . EYE SURGERY    . INTRAOPERATIVE TRANSESOPHAGEAL ECHOCARDIOGRAM  10/05/2015   Procedure: INTRAOPERATIVE TRANSESOPHAGEAL ECHOCARDIOGRAM;  Surgeon: Rexene Alberts, MD;  Location: [facility] OR;  Service: Open Heart Surgery;;  . JOINT REPLACEMENT     shoulder  . LEFT HEART CATHETERIZATION WITH CORONARY ANGIOGRAM N/A 11/16/2014   Procedure: LEFT HEART CATHETERIZATION WITH CORONARY ANGIOGRAM;  Surgeon: Troy Sine, MD;  Location: [facility] CATH LAB;  Service: Cardiovascular;  Laterality: N/A;  . MITRAL VALVE REPAIR Right 10/04/2015   Procedure: MINIMALLY INVASIVE MITRAL VALVE REPAIR (MVR);  Surgeon: Rexene Alberts, MD;  Location: [facility];  Service: Open Heart Surgery;  Laterality: Right;  . Rt rotator cuff repair    . TEE WITHOUT CARDIOVERSION N/A 06/22/2015   Procedure: TRANSESOPHAGEAL ECHOCARDIOGRAM (TEE);  Surgeon: Skeet Latch, MD;  Location: [facility];  Service: Cardiovascular;  Laterality: N/A;  . TEE WITHOUT CARDIOVERSION N/A 10/04/2015   Procedure: TRANSESOPHAGEAL ECHOCARDIOGRAM (TEE);  Surgeon: Rexene Alberts, MD;  Location: [facility];  Service: Open Heart Surgery;  Laterality: N/A;  . WOUND EXPLORATION N/A 10/05/2015   Procedure: Sternal Incision;  Surgeon: Rexene Alberts, MD;  Location: [facility];  Service: Open Heart Surgery;  Laterality: N/A;    Prior to Admission medications   Medication Sig Start Date End Date Taking? Authorizing Provider  allopurinol (ZYLOPRIM) 100 MG tablet TAKE ONE (1) TABLET EACH DAY 02/06/18   [name], MD  colchicine  0.6 MG tablet Take 1 tablet (0.6 mg total) by mouth daily. Patient not taking: Reported on 01/15/2018 09/04/16   [name], MD  cyclobenzaprine (FLEXERIL) 5 MG tablet Take 1 tablet (5 mg total) by mouth 3 (three) times daily as needed for muscle spasms. 02/24/18   [name], MD  docusate sodium (COLACE) 100 MG capsule Take 1 capsule (100 mg total) by mouth daily as needed for moderate constipation. 01/03/18 01/03/19  Dustin Flock, MD  finasteride (PROSCAR) 5 MG tablet TAKE ONE (1) TABLET BY MOUTH EVERY DAY 10/20/17   [name], MD  furosemide (LASIX) 40 MG tablet Take 2 tablets (80 mg total) by mouth 2 (two) times daily. 11/10/17   Pickard,  Cammie Mcgee, MD  magnesium oxide (MAG-OX) 400 MG tablet Take 1 tablet (400 mg total) by mouth daily. 07/09/16   Florencia Reasons, MD  metoprolol tartrate (LOPRESSOR) 25 MG tablet TAKE ONE-HALF TABLET BY MOUTH TWICE DAILY 09/19/17   [name], MD  OVER THE COUNTER MEDICATION Neuroquell: Take 1 capsule by mouth once a day for immune health    [provider]  polyethylene glycol (MIRALAX) packet Take 17 g by mouth daily. 01/03/18   Dustin Flock, MD  potassium chloride (KLOR-CON) 20 MEQ packet Take 40 mEq by mouth daily. 05/22/17   [name], MD  pravastatin (PRAVACHOL) 40 MG tablet TAKE ONE (1) TABLET EACH DAY AT 6 P.M. 08/12/17   [name], MD  spironolactone (ALDACTONE) 25 MG tablet Take 1 tablet (25 mg total) by mouth daily. 11/28/17   [name], MD  traMADol (ULTRAM) 50 MG tablet Take 1 tablet (50 mg total) by mouth every 6 (six) hours as needed for moderate pain. 11/11/16   Meuth, Brooke A, PA-C  warfarin (COUMADIN) 4 MG tablet Take 1 tablet (4 mg total) by mouth daily. Do not take with any other warfarin unless Dr. Dennard Schaumann instructs. Patient taking differently: Currently taking: 2MG  (Monday & Friday morning) and 4MG  (all other mornings) 11/07/17   Pickard, Cammie Mcgee, MD  Wheat Dextrin (BENEFIBER) POWD Take by mouth See  admin instructions. Mix one teaspoonful into 6-8 ounces of fluid/milk and drink once daily    [provider]    Family History  Problem Relation Age of Onset  . Heart failure Mother 13  . Heart attack Mother   . Heart attack Brother      Social History   Tobacco Use  . Smoking status: Former Smoker    Types: Cigarettes, Pipe, Landscape architect  . Smokeless tobacco: Former Systems developer    Types: Chew  . Tobacco comment: QUIT SMOKING  MANY YEARS AGO"  Substance Use Topics  . Alcohol use: No  . Drug use: No    Allergies as of 02/26/2018  . (No Known Allergies)    Review of Systems:    All systems reviewed and negative except where noted in HPI.   Physical Exam:  BP 96/62   Pulse 61   Ht 5\' 7"  (1.702 m)   Wt 192 lb 11.2 oz (87.4 kg)   BMI 30.18 kg/m  No LMP for male patient. Psych:  Alert and cooperative. Normal mood and affect. General:   Alert,  Well-developed, well-nourished, pleasant and cooperative in NAD Head:  Normocephalic and atraumatic. Eyes:  Sclera clear, no icterus.   Conjunctiva pink. Ears:  Normal auditory acuity. Nose:  No deformity, discharge, or lesions. Mouth:  No deformity or lesions,oropharynx pink & moist. Neck:  Supple; no masses or thyromegaly. Lungs:  Respirations even and unlabored.  Clear throughout to auscultation.   No wheezes, crackles, or rhonchi. No acute distress. Heart:  Regular rate and rhythm; no murmurs, clicks, rubs, or gallops. Abdomen:  Normal bowel sounds.  No bruits.  Soft, non-tender and non-distended without masses, hepatosplenomegaly or hernias noted.  No guarding or rebound tenderness.    Neurologic:  Alert and oriented x3;  grossly normal neurologically. Skin:  Intact without significant lesions or rashes. No jaundice. Lymph Nodes:  No significant cervical adenopathy. Psych:  Alert and cooperative. Normal mood and affect.  Imaging Studies: Dg Chest 2 View  Result Date: 02/24/2018 CLINICAL DATA:  Chronic cough and shortness of  Breath EXAM: CHEST - 2  VIEW COMPARISON:  01/01/2018 FINDINGS: Cardiac shadow remains enlarged. The lungs are well aerated bilaterally. Old rib fractures are seen on the right with healing. No pneumothorax is noted. Some scarring is noted in the right lung base stable from the prior exam. No focal infiltrate is seen. No acute abnormality noted. IMPRESSION: Chronic changes in the right base.  No acute abnormality noted. Electronically Signed   By: Inez Catalina M.D.   On: 02/24/2018 14:35   Dg Cervical Spine Complete  Result Date: 02/24/2018 CLINICAL DATA:  Neck pain for several days, no known injury, initial encounter EXAM: CERVICAL SPINE - COMPLETE 4+ VIEW COMPARISON:  11/07/2016 FINDINGS: Postsurgical changes are again seen at C4-5 with anterior fixation. Multilevel osteophytic changes are noted throughout the cervical spine. Facet hypertrophic changes are noted as well. Some neural foraminal narrowing is noted at C3-4 much greater on the right than the left. No acute fracture or acute facet abnormality is noted. No soft tissue changes are seen. The odontoid is within normal limits. IMPRESSION: Degenerative and postsurgical changes similar to that seen on prior exam. No acute abnormality noted. Electronically Signed   By: Inez Catalina M.D.   On: 02/24/2018 14:35    Assessment and Plan:   [name] is a 84 y.o. y/o male has been referred for dysphagia.  Going back in his history it appears that it has been ongoing for a few years.  There was some evaluation performed in 2018 June July with a barium esophagogram with a tablet which showed laryngeal penetration subsequently a modified barium swallow also showed aspiration.  He has been referred here today for dysphagia.It appears that it is long standing suspect a trasnsfer dysphagia but wil rule out an esophagal issue as well    Plan 1.  EGD to rule out any obstruction.  Rule out EOE. On coumadin will need holding instructions  2.  Refer to speech  and language therapy to see if any form of maneuvers can help him to get food down easier all fails then the G-tube is possibly an option down the road.  I have discussed alternative options, risks & benefits,  which include, but are not limited to, bleeding, infection, perforation,respiratory complication & drug reaction.  The patient agrees with this plan & written consent will be obtained.     Follow up in 8-12 weeks   Dr [name] MD,MRCP(U.K)

## 2018-02-27 ENCOUNTER — Other Ambulatory Visit: Payer: Self-pay

## 2018-02-27 ENCOUNTER — Other Ambulatory Visit: Payer: Self-pay | Admitting: Family Medicine

## 2018-02-27 LAB — CBC WITH DIFFERENTIAL/PLATELET
Basophils Absolute: 52 cells/uL (ref 0–200)
Basophils Relative: 0.6 %
Eosinophils Absolute: 87 cells/uL (ref 15–500)
Eosinophils Relative: 1 %
HCT: 44.7 % (ref 38.5–50.0)
Hemoglobin: 14.9 g/dL (ref 13.2–17.1)
Lymphs Abs: 2219 cells/uL (ref 850–3900)
MCH: 31.7 pg (ref 27.0–33.0)
MCHC: 33.3 g/dL (ref 32.0–36.0)
MCV: 95.1 fL (ref 80.0–100.0)
MPV: 11.2 fL (ref 7.5–12.5)
Monocytes Relative: 7.8 %
Neutro Abs: 5664 cells/uL (ref 1500–7800)
Neutrophils Relative %: 65.1 %
Platelets: 134 10*3/uL — ABNORMAL LOW (ref 140–400)
RBC: 4.7 10*6/uL (ref 4.20–5.80)
RDW: 13.6 % (ref 11.0–15.0)
Total Lymphocyte: 25.5 %
WBC mixed population: 679 cells/uL (ref 200–950)
WBC: 8.7 10*3/uL (ref 3.8–10.8)

## 2018-02-27 LAB — BASIC METABOLIC PANEL WITH GFR
BUN / CREAT RATIO: 28 (calc) — AB (ref 6–22)
BUN: 35 mg/dL — ABNORMAL HIGH (ref 7–25)
CO2: 26 mmol/L (ref 20–32)
Calcium: 9 mg/dL (ref 8.6–10.3)
Chloride: 98 mmol/L (ref 98–110)
Creat: 1.25 mg/dL — ABNORMAL HIGH (ref 0.70–1.11)
GFR, Est African American: 60 mL/min/{1.73_m2} (ref >=60)
GFR, Est Non African American: 52 mL/min/{1.73_m2} — ABNORMAL LOW (ref >=60)
GLUCOSE: 173 mg/dL — AB (ref 65–99)
POTASSIUM: 3.9 mmol/L (ref 3.5–5.3)
SODIUM: 137 mmol/L (ref 135–146)

## 2018-02-27 LAB — TEST AUTHORIZATION

## 2018-02-27 LAB — HEMOGLOBIN A1C W/OUT EAG: Hgb A1c MFr Bld: 6.3 % of total Hgb — ABNORMAL HIGH (ref <5.7)

## 2018-02-27 NOTE — Telephone Encounter (Signed)
Ok to refill??  Last office visit 02/24/2018.  Last refill 11/11/2016.

## 2018-03-06 ENCOUNTER — Telehealth: Payer: Self-pay

## 2018-03-06 NOTE — Telephone Encounter (Signed)
   [facility] Pre-operative Risk Assessment    Request for surgical clearance:  1. What type of surgery is being performed? EGD procedure   2. When is this surgery scheduled? TBD   3. What type of clearance is required (medical clearance vs. Pharmacy clearance to hold med vs. Both)? Both  4. Are there any medications that need to be held prior to surgery and how long? Warfarin   5. Practice name and name of physician performing surgery? [facility] - Dr. [name]   6. What is your office phone number [phone]    7.   What is your office fax number [phone]  8.   Anesthesia type (None, local, MAC, general) ? Unknown    [name] 03/06/2018, 4:57 PM  _________________________________________________________________   (provider comments below)

## 2018-03-12 ENCOUNTER — Telehealth: Payer: Self-pay | Admitting: Gastroenterology

## 2018-03-12 NOTE — Telephone Encounter (Signed)
Pt wife is calling about pts apt for EGD she states he has trouble swollowing and is wanting to know when apt will be. Per Ginger we are just waiting on clearance from Cardiologist pt aware we are contacting them to check on clearance

## 2018-03-12 NOTE — Telephone Encounter (Signed)
Pt with hx of minimally invasive MV repair and afib. Will send to pharmacy for input on anticoagulation. Will then need phone call to assess for any changes since last seen on 01/13/2018 by Dr. Hortencia Conradi.

## 2018-03-13 ENCOUNTER — Other Ambulatory Visit: Payer: Self-pay

## 2018-03-13 DIAGNOSIS — R1310 Dysphagia, unspecified: Secondary | ICD-10-CM

## 2018-03-13 NOTE — Telephone Encounter (Signed)
VM left for patient to call back to preop clinic

## 2018-03-13 NOTE — Telephone Encounter (Signed)
Patient with diagnosis of Afib on warfarin for anticoagulation.    Procedure: EGD Date of procedure: TBD  CHADS2-VASc score of  6 (CHF, HTN, AGE, DM2, stroke/tia x 2, CAD, AGE, male)  Per office protocol, patient can hold warfarin for 5 days prior to procedure.   Patient will not need bridging with Lovenox (enoxaparin) around procedure.

## 2018-03-13 NOTE — Telephone Encounter (Signed)
   Primary Cardiologist: [name], MD  Chart reviewed as part of pre-operative protocol coverage. Patient was contacted 03/13/2018 in reference to pre-operative risk assessment for pending surgery as outlined below.  [name] was last seen on 01/13/18 by Dr. Percival Spanish.  Since that day, [name] has done about the same. He has hx of HFPEF, CAD, narrow complex tachycardia and MV repair in 10/2015.  He has chronic shortness of breath for the last 2-3 years, no worse recently. No chest pain. His activity is very limited. He has recent wt loss and dysphagia.   Therefore, based on ACC/AHA guidelines, the patient would be at acceptable risk for the planned low risk procedure without further cardiovascular testing. If he should need a more invasive surgery, he would need to see Dr. Percival Spanish for further evaluation.     According to our pharmacy protocol:        Patient with diagnosis of Afib on warfarin for anticoagulation.    Procedure: EGD Date of procedure: TBD  CHADS2-VASc score of  6 (CHF, HTN, AGE, DM2, stroke/tia x 2, CAD, AGE, male)  Per office protocol, patient can hold warfarin for 5 days prior to procedure.   Patient will not need bridging with Lovenox (enoxaparin) around procedure.       I will route this recommendation to the requesting party via Epic fax function and remove from pre-op pool.  Please call with questions.  Daune Perch, NP 03/13/2018, 2:14 PM

## 2018-03-19 ENCOUNTER — Ambulatory Visit: Payer: Medicare Other | Admitting: Anesthesiology

## 2018-03-19 ENCOUNTER — Encounter: Payer: Self-pay | Admitting: *Deleted

## 2018-03-19 ENCOUNTER — Ambulatory Visit
Admission: RE | Admit: 2018-03-19 | Discharge: 2018-03-19 | Disposition: A | Discharge status: Home / Self Care | Payer: Medicare Other | Source: Ambulatory Visit | Attending: Gastroenterology | Admitting: Gastroenterology

## 2018-03-19 ENCOUNTER — Encounter
Admission: RE | Disposition: A | Discharge status: Home / Self Care | Payer: Self-pay | Source: Ambulatory Visit | Attending: Gastroenterology

## 2018-03-19 DIAGNOSIS — I5032 Chronic diastolic (congestive) heart failure: Secondary | ICD-10-CM | POA: Diagnosis not present

## 2018-03-19 DIAGNOSIS — N183 Chronic kidney disease, stage 3 (moderate): Secondary | ICD-10-CM | POA: Diagnosis not present

## 2018-03-19 DIAGNOSIS — E1122 Type 2 diabetes mellitus with diabetic chronic kidney disease: Secondary | ICD-10-CM | POA: Insufficient documentation

## 2018-03-19 DIAGNOSIS — Z8546 Personal history of malignant neoplasm of prostate: Secondary | ICD-10-CM | POA: Insufficient documentation

## 2018-03-19 DIAGNOSIS — I252 Old myocardial infarction: Secondary | ICD-10-CM | POA: Insufficient documentation

## 2018-03-19 DIAGNOSIS — Z951 Presence of aortocoronary bypass graft: Secondary | ICD-10-CM | POA: Insufficient documentation

## 2018-03-19 DIAGNOSIS — K2970 Gastritis, unspecified, without bleeding: Secondary | ICD-10-CM | POA: Diagnosis not present

## 2018-03-19 DIAGNOSIS — Z87891 Personal history of nicotine dependence: Secondary | ICD-10-CM | POA: Insufficient documentation

## 2018-03-19 DIAGNOSIS — I2510 Atherosclerotic heart disease of native coronary artery without angina pectoris: Secondary | ICD-10-CM | POA: Diagnosis not present

## 2018-03-19 DIAGNOSIS — Z7901 Long term (current) use of anticoagulants: Secondary | ICD-10-CM | POA: Diagnosis not present

## 2018-03-19 DIAGNOSIS — R1310 Dysphagia, unspecified: Secondary | ICD-10-CM | POA: Diagnosis not present

## 2018-03-19 DIAGNOSIS — Z79899 Other long term (current) drug therapy: Secondary | ICD-10-CM | POA: Diagnosis not present

## 2018-03-19 DIAGNOSIS — M109 Gout, unspecified: Secondary | ICD-10-CM | POA: Diagnosis not present

## 2018-03-19 DIAGNOSIS — I480 Paroxysmal atrial fibrillation: Secondary | ICD-10-CM | POA: Insufficient documentation

## 2018-03-19 DIAGNOSIS — I130 Hypertensive heart and chronic kidney disease with heart failure and stage 1 through stage 4 chronic kidney disease, or unspecified chronic kidney disease: Secondary | ICD-10-CM | POA: Diagnosis not present

## 2018-03-19 DIAGNOSIS — K2940 Chronic atrophic gastritis without bleeding: Secondary | ICD-10-CM | POA: Diagnosis not present

## 2018-03-19 HISTORY — PX: ESOPHAGOGASTRODUODENOSCOPY (EGD) WITH PROPOFOL: SHX5813

## 2018-03-19 SURGERY — ESOPHAGOGASTRODUODENOSCOPY (EGD) WITH PROPOFOL
Anesthesia: General

## 2018-03-19 MED ORDER — PROPOFOL 10 MG/ML IV BOLUS
INTRAVENOUS | Status: DC | PRN
  Administered 2018-03-19: 30 mg via INTRAVENOUS

## 2018-03-19 MED ORDER — SODIUM CHLORIDE 0.9 % IV SOLN
INTRAVENOUS | Status: DC
  Administered 2018-03-19: 07:00:00 via INTRAVENOUS

## 2018-03-19 MED ORDER — PHENYLEPHRINE HCL 10 MG/ML IJ SOLN
INTRAMUSCULAR | Status: AC
  Filled 2018-03-19: qty 1

## 2018-03-19 MED ORDER — LIDOCAINE HCL (CARDIAC) PF 100 MG/5ML IV SOSY
PREFILLED_SYRINGE | INTRAVENOUS | Status: DC | PRN
  Administered 2018-03-19: 100 mg via INTRAVENOUS

## 2018-03-19 MED ORDER — PROPOFOL 500 MG/50ML IV EMUL
INTRAVENOUS | Status: DC | PRN
  Administered 2018-03-19: 40 ug/kg/min via INTRAVENOUS

## 2018-03-19 MED ORDER — EPHEDRINE SULFATE 50 MG/ML IJ SOLN
INTRAMUSCULAR | Status: AC
  Filled 2018-03-19: qty 1

## 2018-03-19 MED ORDER — LIDOCAINE HCL (PF) 2 % IJ SOLN
INTRAMUSCULAR | Status: AC
Start: 2018-03-19 — End: ?
  Filled 2018-03-19: qty 10

## 2018-03-19 MED ORDER — PROPOFOL 500 MG/50ML IV EMUL
INTRAVENOUS | Status: AC
  Filled 2018-03-19: qty 50

## 2018-03-19 NOTE — H&P (Signed)
Fred Bellows, MD [address] [place], [place], [place], [address] [address], [place], [place], [place], [address] Phone: [phone]  Fax: [phone]  Primary Care Physician:  [name], MD   Pre-Procedure History & Physical: HPI:  [name] is a 84 y.o. male is here for an endoscopy    Past Medical History:  Diagnosis Date  . Atelectasis of right lung   . Chronic diastolic (congestive) heart failure ([place])    a. 10/2013 Echo: EF 55-60%, basal-mid anteroseptal and basal-mid inferoseptal HK; b. 02/2017 Echo: EF 60-65%, Gr3 DD.  Marland Kitchen Chronic fatigue   . Chronic subdural hematoma ([place])    a. 12/2015 Head CT: chronic right holo-hemispheric SDH w/o midline shift.  . CKD (chronic kidney disease), stage III ([place])   . Coronary artery disease    a. 11/2014 NSTEMI: DESx 2 placed to LAD and RCA; b. 08/2015 Cath: LAD patent stent, LCX 23m, RCA patent stent, 49m.  . Diabetes mellitus, type II ([place])    a. HgA1c 6.8 in 03/2015  . ED (erectile dysfunction)   . Gout   . Hemangioma of liver 08/02/2015   12 mm enhancing lesion seen on CT - suspicious for benign hemangioma but not diagnostic - MRI recommended  . HLD (hyperlipidemia)   . Hypertension   . Hypertensive heart disease   . Ischemic finger ulcer ([place])    a. 03/2017 Seen for digital ulcers - felt to be embolic in setting of PAF. Pt on coumadin.  . Kidney stones   . Lower extremity edema    a. chronic  . OA (osteoarthritis)   . Orthostatic hypotension   . PAF (paroxysmal atrial fibrillation) ([place])    a. 10/2015 s/p DCCV;  b. CHA2DS2VASc = 6--> on coumadin; c. 10/2015 Echo: sev dil LA.  . Prostate cancer ([place])   . Pulmonary HTN ([place])   . Pulmonary hypertension ([place])    a. 10/2015 Echo: PASP 59mmHg.  Marland Kitchen Renal insufficiency   . Severe mitral regurgitation    a.  10/2015 s/p minimally invasive MV repair; b. 10/2015 Echo: mod MS, mean grad 80mmHg; c. 02/2017 Echo: EF 60-65%, Gr3 DD, mod Ca2+ MV annulus, mod to sev dil LA,  mod dil RA, mod TR. PASP 11mmHg.  Marland Kitchen Spinal stenosis   . SVT (supraventricular tachycardia) (H[facility])    a. recurrent, usually responsive to vagal manuevers  . Tricuspid regurgitation    a. 10/2015 Echo: mild to mod TR.    Past Surgical History:  Procedure Laterality Date  . BACK SURGERY    . Back surgery x 2    . CARDIAC CATHETERIZATION N/A 08/08/2015   Procedure: Right/Left Heart Cath and Coronary Angiography;  Surgeon: Sherren Mocha, MD;  Location: [facility];  Service: Cardiovascular;  Laterality: N/A;  . CARDIAC SURGERY     2 cardiac stents  . CARDIOVERSION N/A 10/20/2015   Procedure: CARDIOVERSION;  Surgeon: Pixie Casino, MD;  Location: [facility];  Service: Cardiovascular;  Laterality: N/A;  . CERVICAL SPINE SURGERY    . CHOLECYSTECTOMY N/A 07/08/2016   Procedure: LAPAROSCOPIC CHOLECYSTECTOMY WITH INTRAOPERATIVE CHOLANGIOGRAM;  Surgeon: Georganna Skeans, MD;  Location: [facility];  Service: General;  Laterality: N/A;  . CORONARY ARTERY BYPASS GRAFT    . EYE SURGERY    . INTRAOPERATIVE TRANSESOPHAGEAL ECHOCARDIOGRAM  10/05/2015   Procedure: INTRAOPERATIVE TRANSESOPHAGEAL ECHOCARDIOGRAM;  Surgeon: Rexene Alberts, MD;  [facility];  Service: Open Heart Surgery;;  . JOINT REPLACEMENT  shoulder  . LEFT HEART CATHETERIZATION WITH CORONARY ANGIOGRAM N/A 11/16/2014   Procedure: LEFT HEART CATHETERIZATION WITH CORONARY ANGIOGRAM;  Surgeon: Troy Sine, MD;  Location: [facility];  Service: Cardiovascular;  Laterality: N/A;  . MITRAL VALVE REPAIR Right 10/04/2015   Procedure: MINIMALLY INVASIVE MITRAL VALVE REPAIR (MVR);  Surgeon: Rexene Alberts, MD;  Location: [facility];  Service: Open Heart Surgery;  Laterality: Right;  . Rt rotator cuff repair    . TEE WITHOUT CARDIOVERSION N/A 06/22/2015   Procedure: TRANSESOPHAGEAL ECHOCARDIOGRAM (TEE);  Surgeon: Skeet Latch, MD;  Location: [place] [facility];  Service: Cardiovascular;  Laterality: N/A;  . TEE WITHOUT CARDIOVERSION N/A 10/04/2015    Procedure: TRANSESOPHAGEAL ECHOCARDIOGRAM (TEE);  Surgeon: Rexene Alberts, MD;  Location: [facility];  Service: Open Heart Surgery;  Laterality: N/A;  . WOUND EXPLORATION N/A 10/05/2015   Procedure: Sternal Incision;  Surgeon: Rexene Alberts, MD;  Location: [facility];  Service: Open Heart Surgery;  Laterality: N/A;    Prior to Admission medications   Medication Sig Start Date End Date Taking? Authorizing Provider  allopurinol (ZYLOPRIM) 100 MG tablet TAKE ONE (1) TABLET EACH DAY 02/06/18  Yes [name], MD  colchicine 0.6 MG tablet Take 1 tablet (0.6 mg total) by mouth daily. 09/04/16  Yes [name], MD  cyclobenzaprine (FLEXERIL) 5 MG tablet Take 1 tablet (5 mg total) by mouth 3 (three) times daily as needed for muscle spasms. 02/24/18  Yes [name], MD  docusate sodium (COLACE) 100 MG capsule Take 1 capsule (100 mg total) by mouth daily as needed for moderate constipation. 01/03/18 01/03/19 Yes Dustin Flock, MD  finasteride (PROSCAR) 5 MG tablet TAKE ONE (1) TABLET BY MOUTH EVERY DAY 10/20/17  Yes [name], MD  furosemide (LASIX) 40 MG tablet Take 2 tablets (80 mg total) by mouth 2 (two) times daily. 11/10/17  Yes [name], MD  metoprolol tartrate (LOPRESSOR) 25 MG tablet TAKE ONE-HALF TABLET BY MOUTH TWICE DAILY 09/19/17  Yes [name], MD  OVER THE COUNTER MEDICATION Neuroquell: Take 1 capsule by mouth once a day for immune health   Yes [provider]  polyethylene glycol (MIRALAX) packet Take 17 g by mouth daily. 01/03/18  Yes Dustin Flock, MD  potassium chloride (KLOR-CON) 20 MEQ packet Take 40 mEq by mouth daily. 05/22/17  Yes [name], MD  pravastatin (PRAVACHOL) 40 MG tablet TAKE ONE (1) TABLET EACH DAY AT 6 P.M. 08/12/17  Yes [name], MD  spironolactone (ALDACTONE) 25 MG tablet Take 1 tablet (25 mg total) by mouth daily. 11/28/17  Yes [name], MD  traMADol (ULTRAM) 50 MG tablet TAKE ONE TABLET BY MOUTH EVERY 6 HOURS  AS NEEDED FOR MODERATE PAIN 02/27/18  Yes [name], MD  warfarin (COUMADIN) 4 MG tablet Take 1 tablet (4 mg total) by mouth daily. Do not take with any other warfarin unless Dr. Dennard Schaumann instructs. Patient taking differently: Currently taking: 2MG  (Monday & Friday morning) and 4MG  (all other mornings) 11/07/17  Yes Pickard, Cammie Mcgee, MD  Wheat Dextrin (BENEFIBER) POWD Take by mouth See admin instructions. Mix one teaspoonful into 6-8 ounces of fluid/milk and drink once daily   Yes [provider]  magnesium oxide (MAG-OX) 400 MG tablet Take 1 tablet (400 mg total) by mouth daily. Patient not taking: Reported on 03/19/2018 07/09/16   Florencia Reasons, MD    Allergies as of 03/13/2018  . (No Known Allergies)  Family History  Problem Relation Age of Onset  . Heart failure Mother 33  . Heart attack Mother   . Heart attack Brother     Social History   Socioeconomic History  . Marital status: Married    Spouse name: Not on file  . Number of children: 4  . Years of education: Not on file  . Highest education level: Not on file  Occupational History    Employer: RETIRED  Social Needs  . Financial resource strain: Not on file  . Food insecurity:    Worry: Not on file    Inability: Not on file  . Transportation needs:    Medical: Not on file    Non-medical: Not on file  Tobacco Use  . Smoking status: Former Smoker    Types: Cigarettes, Pipe, Landscape architect  . Smokeless tobacco: Former Systems developer    Types: Chew  . Tobacco comment: QUIT SMOKING  MANY YEARS AGO"  Substance and Sexual Activity  . Alcohol use: No  . Drug use: No  . Sexual activity: Not on file  Lifestyle  . Physical activity:    Days per week: Not on file    Minutes per session: Not on file  . Stress: Not on file  Relationships  . Social connections:    Talks on phone: Not on file    Gets together: Not on file    Attends religious service: Not on file    Active member of club or organization: Not on file    Attends  meetings of clubs or organizations: Not on file    Relationship status: Not on file  . Intimate partner violence:    Fear of current or ex partner: Not on file    Emotionally abused: Not on file    Physically abused: Not on file    Forced sexual activity: Not on file  Other Topics Concern  . Not on file  Social History Narrative   ** Merged History Encounter **       Four adopted children.  Lives alone.     Review of Systems: See HPI, otherwise negative ROS  Physical Exam: BP 117/66   Pulse 60   Temp (!) 96.2 F (35.7 C) (Tympanic)   Resp 18   Ht 5\' 9"  (1.753 m)   Wt 87.5 kg   SpO2 100%   BMI 28.50 kg/m  General:   Alert,  pleasant and cooperative in NAD Head:  Normocephalic and atraumatic. Neck:  Supple; no masses or thyromegaly. Lungs:  Clear throughout to auscultation, normal respiratory effort.    Heart:  +S1, +S2, Regular rate and rhythm, No edema. Abdomen:  Soft, nontender and nondistended. Normal bowel sounds, without guarding, and without rebound.   Neurologic:  Alert and  oriented x4;  grossly normal neurologically.  Impression/Plan: [name] is here for an endoscopy  to be performed for  evaluation of dysphagia    Risks, benefits, limitations, and alternatives regarding endoscopy and dilation  have been reviewed with the patient.  Questions have been answered.  All parties agreeable.   Fred Bellows, MD  03/19/2018, 7:22 AM

## 2018-03-19 NOTE — Anesthesia Procedure Notes (Signed)
Performed by: Azeez Dunker, CRNA Pre-anesthesia Checklist: Patient identified, Emergency Drugs available, Suction available, Patient being monitored and Timeout performed Patient Re-evaluated:Patient Re-evaluated prior to induction Oxygen Delivery Method: Nasal cannula Induction Type: IV induction       

## 2018-03-19 NOTE — Anesthesia Preprocedure Evaluation (Addendum)
Anesthesia Evaluation  Patient identified by MRN, date of birth, ID band Patient awake    Reviewed: Allergy & Precautions, H&P , NPO status , Patient's Chart, lab work & pertinent test results  Airway Mallampati: II  TM Distance: >3 FB Neck ROM: full    Dental  (+) Lower Dentures, Upper Dentures   Pulmonary neg pulmonary ROS, shortness of breath (Pt reports stable SOB since open heart surgery a few years ago) and with exertion, neg recent URI, former smoker,    breath sounds clear to auscultation       Cardiovascular hypertension, + CAD and +CHF (diastolic. EF 55-60%)  negative cardio ROS   Rhythm:Regular     Neuro/Psych negative neurological ROS  negative psych ROS   GI/Hepatic negative GI ROS, Neg liver ROS,   Endo/Other  negative endocrine ROSdiabetes  Renal/GU Renal diseasenegative Renal ROS  negative genitourinary   Musculoskeletal  (+) Arthritis ,   Abdominal   Peds  Hematology negative hematology ROS (+)   Anesthesia Other Findings Past Medical History: No date: Atelectasis of right lung No date: Chronic diastolic (congestive) heart failure ([place])     Comment:  a. 10/2013 Echo: EF 55-60%, basal-mid anteroseptal and               basal-mid inferoseptal HK; b. 02/2017 Echo: EF 60-65%, Gr3              DD. No date: Chronic fatigue No date: Chronic subdural hematoma ([place])     Comment:  a. 12/2015 Head CT: chronic right holo-hemispheric SDH               w/o midline shift. No date: CKD (chronic kidney disease), stage III ([facility]) No date: Coronary artery disease     Comment:  a. 11/2014 NSTEMI: DESx 2 placed to LAD and RCA; b.               08/2015 Cath: LAD patent stent, LCX 46m, RCA patent stent,              32m. No date: Diabetes mellitus, type II ([place])     Comment:  a. HgA1c 6.8 in 03/2015 No date: ED (erectile dysfunction) No date: Gout 08/02/2015: Hemangioma of liver     Comment:  12 mm enhancing lesion  seen on CT - suspicious for               benign hemangioma but not diagnostic - MRI recommended No date: HLD (hyperlipidemia) No date: Hypertension No date: Hypertensive heart disease No date: Ischemic finger ulcer ([place])     Comment:  a. 03/2017 Seen for digital ulcers - felt to be embolic               in setting of PAF. Pt on coumadin. No date: Kidney stones No date: Lower extremity edema     Comment:  a. chronic No date: OA (osteoarthritis) No date: Orthostatic hypotension No date: PAF (paroxysmal atrial fibrillation) ([place])     Comment:  a. 10/2015 s/p DCCV;  b. CHA2DS2VASc = 6--> on coumadin;               c. 10/2015 Echo: sev dil LA. No date: Prostate cancer ([place]) No date: Pulmonary HTN ([place]) No date: Pulmonary hypertension ([place])     Comment:  a. 10/2015 Echo: PASP 51mmHg. No date: Renal insufficiency No date: Severe mitral regurgitation     Comment:  a.  10/2015 s/p minimally invasive MV repair; b. 10/2015  Echo: mod MS, mean grad 8mmHg; c. 02/2017 Echo: EF 60-65%,              Gr3 DD, mod Ca2+ MV annulus, mod to sev dil LA, mod dil               RA, mod TR. PASP 64mmHg. No date: Spinal stenosis No date: SVT (supraventricular tachycardia) ([facility])     Comment:  a. recurrent, usually responsive to vagal manuevers No date: Tricuspid regurgitation     Comment:  a. 10/2015 Echo: mild to mod TR.  Past Surgical History: No date: BACK SURGERY No date: Back surgery x 2 08/08/2015: CARDIAC CATHETERIZATION; N/A     Comment:  Procedure: Right/Left Heart Cath and Coronary               Angiography;  Surgeon: Sherren Mocha, MD;  Location: [facility];  Service: Cardiovascular;  Laterality:               N/A; No date: CARDIAC SURGERY     Comment:  2 cardiac stents 10/20/2015: CARDIOVERSION; N/A     Comment:  Procedure: CARDIOVERSION;  Surgeon: Pixie Casino, MD;              Location: [facility];  Service: Cardiovascular;                Laterality:  N/A; No date: CERVICAL SPINE SURGERY 07/08/2016: CHOLECYSTECTOMY; N/A     Comment:  Procedure: LAPAROSCOPIC CHOLECYSTECTOMY WITH               INTRAOPERATIVE CHOLANGIOGRAM;  Surgeon: Georganna Skeans,               MD;  Location: [facility];  Service: General;  Laterality:               N/A; No date: CORONARY ARTERY BYPASS GRAFT No date: EYE SURGERY 10/05/2015: INTRAOPERATIVE TRANSESOPHAGEAL ECHOCARDIOGRAM     Comment:  Procedure: INTRAOPERATIVE TRANSESOPHAGEAL               ECHOCARDIOGRAM;  Surgeon: Rexene Alberts, MD;  Location:              [facility];  Service: Open Heart Surgery;; No date: JOINT REPLACEMENT     Comment:  shoulder 11/16/2014: LEFT HEART CATHETERIZATION WITH CORONARY ANGIOGRAM; N/A     Comment:  Procedure: LEFT HEART CATHETERIZATION WITH CORONARY               ANGIOGRAM;  Surgeon: Troy Sine, MD;  Location: [facility]               [facility];  Service: Cardiovascular;  Laterality: N/A; 10/04/2015: MITRAL VALVE REPAIR; Right     Comment:  Procedure: MINIMALLY INVASIVE MITRAL VALVE REPAIR (MVR);              Surgeon: Rexene Alberts, MD;  Location: [facility];  Service:              Open Heart Surgery;  Laterality: Right; No date: Rt rotator cuff repair 06/22/2015: TEE WITHOUT CARDIOVERSION; N/A     Comment:  Procedure: TRANSESOPHAGEAL ECHOCARDIOGRAM (TEE);                Surgeon: Skeet Latch, MD;  Location: [facility]OS[facility];                Service: Cardiovascular;  Laterality: N/A; 10/04/2015: TEE  WITHOUT CARDIOVERSION; N/A     Comment:  Procedure: TRANSESOPHAGEAL ECHOCARDIOGRAM (TEE);                Surgeon: Rexene Alberts, MD;  Location: [facility];  Service:              Open Heart Surgery;  Laterality: N/A; 10/05/2015: WOUND EXPLORATION; N/A     Comment:  Procedure: Sternal Incision;  Surgeon: Rexene Alberts,               MD;  Location: [facility];  Service: Open Heart Surgery;                Laterality: N/A;  BMI    Body Mass Index:  28.50 kg/m      Reproductive/Obstetrics negative OB  ROS                           Anesthesia Physical Anesthesia Plan  ASA: III  Anesthesia Plan: General   Post-op Pain Management:    Induction:   PONV Risk Score and Plan: Propofol infusion and TIVA  Airway Management Planned:   Additional Equipment:   Intra-op Plan:   Post-operative Plan:   Informed Consent: I have reviewed the patients History and Physical, chart, labs and discussed the procedure including the risks, benefits and alternatives for the proposed anesthesia with the patient or authorized representative who has indicated his/her understanding and acceptance.   Dental Advisory Given  Plan Discussed with: Anesthesiologist  Anesthesia Plan Comments:        Anesthesia Quick Evaluation

## 2018-03-19 NOTE — Transfer of Care (Signed)
Immediate Anesthesia Transfer of Care Note  Patient: [name]  Procedure(s) Performed: ESOPHAGOGASTRODUODENOSCOPY (EGD) WITH PROPOFOL (N/A )  Patient Location: PACU  Anesthesia Type:General  Level of Consciousness: sedated  Airway & Oxygen Therapy: Patient Spontanous Breathing and Patient connected to nasal cannula oxygen  Post-op Assessment: Report given to RN and Post -op Vital signs reviewed and stable  Post vital signs: Reviewed and stable  Last Vitals:  Vitals Value Taken Time  BP    Temp    Pulse    Resp    SpO2      Last Pain:  Vitals:   03/19/18 0707  TempSrc: Tympanic  PainSc: 0-No pain         Complications: No apparent anesthesia complications

## 2018-03-19 NOTE — Anesthesia Post-op Follow-up Note (Signed)
Anesthesia QCDR form completed.        

## 2018-03-19 NOTE — Op Note (Signed)
[facility] Patient Name: [name] Procedure Date: 03/19/2018 7:30 AM MRN: [id number] Account #: [id number] Date of Birth: [birth year] Admit Type: Outpatient Age: 84 Room: [facility] ENDO ROOM 3 Gender: Male Note Status: Finalized Procedure:            Upper GI endoscopy Indications:          Dysphagia Providers:            [name] MD, MD Referring MD:         Cammie Mcgee. Pickard (Referring MD) Medicines:            Monitored Anesthesia Care Complications:        No immediate complications. Procedure:            Pre-Anesthesia Assessment:                       - Prior to the procedure, a History and Physical was                        performed, and patient medications, allergies and                        sensitivities were reviewed. The patient's tolerance of                        previous anesthesia was reviewed.                       - The risks and benefits of the procedure and the                        sedation options and risks were discussed with the                        patient. All questions were answered and informed                        consent was obtained.                       - ASA Grade Assessment: III - A patient with severe                        systemic disease.                       After obtaining informed consent, the endoscope was                        passed under direct vision. Throughout the procedure,                        the patient's blood pressure, pulse, and oxygen                        saturations were monitored continuously. The Endoscope                        was introduced through the mouth, and advanced to the  fourth part of duodenum. The upper GI endoscopy was                        accomplished with ease. The patient tolerated the                        procedure well. Findings:      The examined duodenum was normal.      Patchy moderate inflammation characterized by  congestion (edema),       erosions and erythema was found in the gastric body and in the gastric       antrum. Biopsies were taken with a cold forceps for histology.      No endoscopic abnormality was evident in the esophagus to explain the       patient's complaint of dysphagia. Biopsies were obtained from the       proximal and distal esophagus with cold forceps for histology of       suspected eosinophilic esophagitis.      The cardia and gastric fundus were normal on retroflexion. Impression:           - Normal examined duodenum.                       - Gastritis. Biopsied.                       - No endoscopic esophageal abnormality to explain                        patient's dysphagia. Biopsied. Recommendation:       - Discharge patient to home (with escort).                       - Resume previous diet.                       - Continue present medications.                       - Await pathology results.                       - Return to my office in 4 weeks. Procedure Code(s):    --- Professional ---                       [phone], Esophagogastroduodenoscopy, flexible, transoral;                        with biopsy, single or multiple Diagnosis Code(s):    --- Professional ---                       K29.70, Gastritis, unspecified, without bleeding                       R13.10, Dysphagia, unspecified CPT copyright 2017 American Medical Association. All rights reserved. The codes documented in this report are preliminary and upon coder review may  be revised to meet current compliance requirements. [name], MD [name] MD, MD 03/19/2018 7:52:06 AM This report has been signed electronically. Number of Addenda: 0 Note Initiated On: 03/19/2018 7:30 AM      [facility]

## 2018-03-19 NOTE — Anesthesia Postprocedure Evaluation (Signed)
Anesthesia Post Note  Patient: [name]  Procedure(s) Performed: ESOPHAGOGASTRODUODENOSCOPY (EGD) WITH PROPOFOL (N/A )  Patient location during evaluation: PACU Anesthesia Type: General Level of consciousness: awake and alert Pain management: pain level controlled Vital Signs Assessment: post-procedure vital signs reviewed and stable Respiratory status: spontaneous breathing, nonlabored ventilation, respiratory function stable and patient connected to nasal cannula oxygen Cardiovascular status: blood pressure returned to baseline and stable Postop Assessment: no apparent nausea or vomiting Anesthetic complications: no     Last Vitals:  Vitals:   03/19/18 0752 03/19/18 0802  BP: 122/80 113/64  Pulse: 70 64  Resp: 14 (!) 24  Temp: (!) 36.1 C   SpO2: 99% 97%    Last Pain:  Vitals:   03/19/18 0802  TempSrc:   PainSc: 0-No pain                 Durenda Hurt

## 2018-03-23 ENCOUNTER — Encounter: Payer: Self-pay | Admitting: Gastroenterology

## 2018-03-23 LAB — SURGICAL PATHOLOGY

## 2018-03-24 ENCOUNTER — Other Ambulatory Visit: Payer: Self-pay | Admitting: Family Medicine

## 2018-03-24 MED ORDER — WARFARIN SODIUM 4 MG PO TABS: ORAL_TABLET | ORAL | 3 refills | Status: DC

## 2018-03-26 ENCOUNTER — Telehealth: Payer: Self-pay

## 2018-03-26 NOTE — Telephone Encounter (Signed)
LVM for pt to return my call regarding EGD results.   

## 2018-03-26 NOTE — Telephone Encounter (Signed)
-----   Message from [name], MD sent at 03/26/2018  9:10 AM EDT ----- Elspeth Blucher inform normal esophageal bx, extensive intestinal metaplasia and atrophy of the stomach repeat EGD with gastric mapping in 6-8 months

## 2018-03-26 NOTE — Telephone Encounter (Signed)
Pt's wife returned my call and results of EGD were given. Pt added to recall list to be contacted in Feb 2020 to schedule follow up procedure.

## 2018-04-01 ENCOUNTER — Ambulatory Visit: Payer: Medicare Other | Admitting: Gastroenterology

## 2018-04-01 ENCOUNTER — Other Ambulatory Visit: Payer: Self-pay | Admitting: Family Medicine

## 2018-04-01 DIAGNOSIS — Z961 Presence of intraocular lens: Secondary | ICD-10-CM | POA: Diagnosis not present

## 2018-04-07 ENCOUNTER — Other Ambulatory Visit: Payer: Self-pay

## 2018-04-07 ENCOUNTER — Encounter: Payer: Self-pay | Admitting: Gastroenterology

## 2018-04-07 ENCOUNTER — Ambulatory Visit: Payer: Medicare Other | Admitting: Gastroenterology

## 2018-04-07 VITALS — BP 101/65 | HR 56 | Ht 67.0 in | Wt 195.4 lb

## 2018-04-07 DIAGNOSIS — R1310 Dysphagia, unspecified: Secondary | ICD-10-CM

## 2018-04-07 NOTE — Progress Notes (Signed)
[name] MD, MRCP(U.K) [address]  [place]  [place], [place] [address]  Main: [phone]  Fax: [phone]   Primary Care Physician: [name], MD  Primary Gastroenterologist:  Dr. [name]   Chief Complaint  Patient presents with  . Follow-up    dysphagia    HPI: [name] is a 84 y.o. male   Summary of history :  She was initially referred and seen on 02/26/18 for dysphagia. She has been seen by a GI back in 2013 for dysphagia.  At that point of time there was concern for food impaction with apple.  EGD in 2007 showed H. pylori gastritis.  Pyloric channel ulcer with a clean base.A modified barium swallow in July 2018 demonstrated deep laryngeal penetration with trace tracheal aspiration with clears with a normal cough reflux on thin liquids with honey consistency laryngeal penetration with clearing with coughing and with pured consistency laryngeal penetration with clearing and coughing barium tablet with applesauce was within normal limits.  A barium esophagogram was performed in June 2018.  There was a laryngeal penetration of the barium is prompted only of mild cough reflex. Seen by SLP and suggested softer and moister foods , clear thropat , ENT eval for chronic hoarsness. Also suggested referral for therapeutic intervention for dysphonia   The dysphagia has been ongoing for the past 3 years and is getting worse, only affects pills.   Interval history   7/25//2019-  04/07/2018   03/19/18: EGD: gastritis- normal esophageal bx, extensive intestinal metaplasia and atrophy of the stomach repeat EGD with gastric mapping in 6-8 months. Esophageal bx showed no eosinophils. +  He says he wakes up every day in the morning with phlegm at the back of his throat and has a hoarse voice. Per his wife his voice has gradually changed over time. Says he has difficulty eating solids if it is dry , chops his food fine.    Current Outpatient Medications  Medication  Sig Dispense Refill  . allopurinol (ZYLOPRIM) 100 MG tablet TAKE ONE (1) TABLET EACH DAY 90 tablet 2  . colchicine 0.6 MG tablet Take 1 tablet (0.6 mg total) by mouth daily. 30 tablet 5  . cyclobenzaprine (FLEXERIL) 5 MG tablet Take 1 tablet (5 mg total) by mouth 3 (three) times daily as needed for muscle spasms. 30 tablet 0  . docusate sodium (COLACE) 100 MG capsule Take 1 capsule (100 mg total) by mouth daily as needed for moderate constipation. 60 capsule 2  . finasteride (PROSCAR) 5 MG tablet TAKE ONE (1) TABLET BY MOUTH EVERY DAY 90 tablet 3  . furosemide (LASIX) 40 MG tablet Take 2 tablets (80 mg total) by mouth 2 (two) times daily. 120 tablet 2  . magnesium oxide (MAG-OX) 400 MG tablet Take 1 tablet (400 mg total) by mouth daily. 30 tablet 0  . metoprolol tartrate (LOPRESSOR) 25 MG tablet TAKE ONE-HALF TABLET BY MOUTH TWICE DAILY 90 tablet 3  . OVER THE COUNTER MEDICATION Neuroquell: Take 1 capsule by mouth once a day for immune health    . polyethylene glycol (MIRALAX) packet Take 17 g by mouth daily. 30 each 0  . potassium chloride (KLOR-CON) 20 MEQ packet Take 40 mEq by mouth daily. 60 packet 0  . pravastatin (PRAVACHOL) 40 MG tablet TAKE ONE (1) TABLET EACH DAY AT 6 P.M. 30 tablet 9  . spironolactone (ALDACTONE) 25 MG tablet Take 1 tablet (25 mg total) by mouth daily. 30 tablet 0  .  traMADol (ULTRAM) 50 MG tablet TAKE ONE TABLET BY MOUTH EVERY 6 HOURS AS NEEDED FOR MODERATE PAIN 30 tablet 0  . warfarin (COUMADIN) 4 MG tablet Take 1 tablet (4 mg total) by mouth daily. Do not take with any other warfarin unless Dr. Dennard Schaumann instructs. 30 tablet 3  . Wheat Dextrin (BENEFIBER) POWD Take by mouth See admin instructions. Mix one teaspoonful into 6-8 ounces of fluid/milk and drink once daily     No current facility-administered medications for this visit.     Allergies as of 04/07/2018  . (No Known Allergies)    ROS:  General: Negative for anorexia, weight loss, fever, chills, fatigue,  weakness. ENT: Negative for hoarseness, difficulty swallowing , nasal congestion. CV: Negative for chest pain, angina, palpitations, dyspnea on exertion, peripheral edema.  Respiratory: Negative for dyspnea at rest, dyspnea on exertion, cough, sputum, wheezing.  GI: See history of present illness. GU:  Negative for dysuria, hematuria, urinary incontinence, urinary frequency, nocturnal urination.  Endo: Negative for unusual weight change.    Physical Examination:   BP 101/65   Pulse (!) 56   Ht 5\' 7"  (1.702 m)   Wt 195 lb 6.4 oz (88.6 kg)   BMI 30.60 kg/m   General: In a wheel chair   Eyes: No icterus. Conjunctivae pink. Mouth: Oropharyngeal mucosa moist and pink , no lesions erythema or exudate. Lungs: Clear to auscultation bilaterally. Non-labored. Heart: Regular rate and rhythm, no murmurs rubs or gallops.  Abdomen: Bowel sounds are normal, nontender, nondistended, no hepatosplenomegaly or masses, no abdominal bruits or hernia , no rebound or guarding.   Neuro: Alert and oriented x 3.  Grossly intact.hoarse , muffled voice Skin: Warm and dry, no jaundice.   Psych: Alert and cooperative, normal mood and affect.   Imaging Studies: No results found.  Assessment and Plan:   [name] is a 84 y.o. y/o male here to follow up for dysphagia.  Going back in his history it appears that it has been ongoing for a few years.  There was some evaluation performed in 2018 June July with a barium esophagogram with a tablet which showed laryngeal penetration subsequently a modified barium swallow also showed aspiration. .It appears that it is long standing suspect a trasnsfer dysphagia. No abnormality seen in the esophagus.  Worsening of hoarsness over time , possible post nasal drip .   Plan 1.   EGD with gastric mapping in 6-8 months for intestinal metaplasia. 2.  Refer to speech and language therapy to see if any form of maneuvers can help him to get food down easier 3. Refer to  ENTfor hoarsness of voice - if ENT eval is negative then may need referral to neurology   Dr [name]  MD,MRCP [facility]) Follow up in 4-5 months

## 2018-04-15 DIAGNOSIS — R1314 Dysphagia, pharyngoesophageal phase: Secondary | ICD-10-CM | POA: Diagnosis not present

## 2018-04-15 DIAGNOSIS — J310 Chronic rhinitis: Secondary | ICD-10-CM | POA: Diagnosis not present

## 2018-04-17 ENCOUNTER — Other Ambulatory Visit: Payer: Self-pay | Admitting: Family Medicine

## 2018-04-27 ENCOUNTER — Encounter: Payer: Self-pay | Admitting: Family Medicine

## 2018-04-27 ENCOUNTER — Ambulatory Visit (INDEPENDENT_AMBULATORY_CARE_PROVIDER_SITE_OTHER): Payer: Medicare Other | Admitting: Family Medicine

## 2018-04-27 ENCOUNTER — Ambulatory Visit: Payer: Medicare Other | Attending: Gastroenterology | Admitting: Speech Pathology

## 2018-04-27 VITALS — BP 118/64 | HR 66 | Temp 97.4°F | Resp 16 | Ht 67.0 in | Wt 200.0 lb

## 2018-04-27 DIAGNOSIS — R1312 Dysphagia, oropharyngeal phase: Secondary | ICD-10-CM | POA: Diagnosis not present

## 2018-04-27 DIAGNOSIS — Z23 Encounter for immunization: Secondary | ICD-10-CM

## 2018-04-27 DIAGNOSIS — L299 Pruritus, unspecified: Secondary | ICD-10-CM

## 2018-04-27 MED ORDER — TRIAMCINOLONE ACETONIDE 0.1 % EX CREA: 1.0000 "application " | TOPICAL_CREAM | Freq: Two times a day (BID) | CUTANEOUS | 0 refills | Status: DC

## 2018-04-27 NOTE — Progress Notes (Signed)
Subjective:    Patient ID: [name], male    DOB: 1929-02-05, 84 y.o.   MRN: [id number]  HPI  Patient presents with 3-week history of itching.  The itching is confined primarily to his extremities.  It is worse on the volar surface of his bicep and forearm bilaterally.  There is no visible rash.  There are no excoriations.  There is no lichenification or evidence of atopic dermatitis in the antecubital fossa's bilaterally.  There are no track marks in between the fingers or other signs of scabies.  He also has some milder itching on both legs primarily on the anterior surfaces of both shins where he has venous stasis dermatitis however this is mild.  He denies any itching on his back, his trunk, his abdomen, or in his genital area.  He denies any changes in medications, soaps, detergents, etc.  He believes it may be due to nerves.  Itching tends to occur worse when he is under stress. Past Medical History:  Diagnosis Date  . Atelectasis of right lung   . Chronic diastolic (congestive) heart failure ([place])    a. 10/2013 Echo: EF 55-60%, basal-mid anteroseptal and basal-mid inferoseptal HK; b. 02/2017 Echo: EF 60-65%, Gr3 DD.  Marland Kitchen Chronic fatigue   . Chronic subdural hematoma ([place])    a. 12/2015 Head CT: chronic right holo-hemispheric SDH w/o midline shift.  . CKD (chronic kidney disease), stage III ([place])   . Coronary artery disease    a. 11/2014 NSTEMI: DESx 2 placed to LAD and RCA; b. 08/2015 Cath: LAD patent stent, LCX 36m, RCA patent stent, 17m.  . Diabetes mellitus, type II ([place])    a. HgA1c 6.8 in 03/2015  . ED (erectile dysfunction)   . Gout   . Hemangioma of liver 08/02/2015   12 mm enhancing lesion seen on CT - suspicious for benign hemangioma but not diagnostic - MRI recommended  . HLD (hyperlipidemia)   . Hypertension   . Hypertensive heart disease   . Ischemic finger ulcer ([place])    a. 03/2017 Seen for digital ulcers - felt to be embolic in setting of PAF. Pt on coumadin.  .  Kidney stones   . Lower extremity edema    a. chronic  . OA (osteoarthritis)   . Orthostatic hypotension   . PAF (paroxysmal atrial fibrillation) ([place])    a. 10/2015 s/p DCCV;  b. CHA2DS2VASc = 6--> on coumadin; c. 10/2015 Echo: sev dil LA.  . Prostate cancer ([place])   . Pulmonary HTN ([place])   . Pulmonary hypertension ([place])    a. 10/2015 Echo: PASP 52mmHg.  Marland Kitchen Renal insufficiency   . Severe mitral regurgitation    a.  10/2015 s/p minimally invasive MV repair; b. 10/2015 Echo: mod MS, mean grad 94mmHg; c. 02/2017 Echo: EF 60-65%, Gr3 DD, mod Ca2+ MV annulus, mod to sev dil LA, mod dil RA, mod TR. PASP 42mmHg.  Marland Kitchen Spinal stenosis   . SVT (supraventricular tachycardia) ([facility])    a. recurrent, usually responsive to vagal manuevers  . Tricuspid regurgitation    a. 10/2015 Echo: mild to mod TR.   Past Surgical History:  Procedure Laterality Date  . BACK SURGERY    . Back surgery x 2    . CARDIAC CATHETERIZATION N/A 08/08/2015   Procedure: Right/Left Heart Cath and Coronary Angiography;  Surgeon: Sherren Mocha, MD;  Location: [place] [facility];  Service: Cardiovascular;  Laterality: N/A;  . CARDIAC SURGERY     2  cardiac stents  . CARDIOVERSION N/A 10/20/2015   Procedure: CARDIOVERSION;  Surgeon: Pixie Casino, MD;  Location: [place];  Service: Cardiovascular;  Laterality: N/A;  . CERVICAL SPINE SURGERY    . CHOLECYSTECTOMY N/A 07/08/2016   Procedure: LAPAROSCOPIC CHOLECYSTECTOMY WITH INTRAOPERATIVE CHOLANGIOGRAM;  Surgeon: Georganna Skeans, MD;  Location: [place];  Service: General;  Laterality: N/A;  . CORONARY ARTERY BYPASS GRAFT    . ESOPHAGOGASTRODUODENOSCOPY (EGD) WITH PROPOFOL N/A 03/19/2018   Procedure: ESOPHAGOGASTRODUODENOSCOPY (EGD) WITH PROPOFOL;  Surgeon: [name], MD;  Location: [facility] ENDOSCOPY;  Service: Gastroenterology;  Laterality: N/A;  . EYE SURGERY    . INTRAOPERATIVE TRANSESOPHAGEAL ECHOCARDIOGRAM  10/05/2015   Procedure: INTRAOPERATIVE TRANSESOPHAGEAL ECHOCARDIOGRAM;  Surgeon:  Rexene Alberts, MD;  Location: [facility] OR;  Service: Open Heart Surgery;;  . JOINT REPLACEMENT     shoulder  . LEFT HEART CATHETERIZATION WITH CORONARY ANGIOGRAM N/A 11/16/2014   Procedure: LEFT HEART CATHETERIZATION WITH CORONARY ANGIOGRAM;  Surgeon: Troy Sine, MD;  Location[facility] CATH LAB;  Service: Cardiovascular;  Laterality: N/A;  . MITRAL VALVE REPAIR Right 10/04/2015   Procedure: MINIMALLY INVASIVE MITRAL VALVE REPAIR (MVR);  Surgeon: Rexene Alberts, MD;  Location: [place];  Service: Open Heart Surgery;  Laterality: Right;  . Rt rotator cuff repair    . TEE WITHOUT CARDIOVERSION N/A 06/22/2015   Procedure: TRANSESOPHAGEAL ECHOCARDIOGRAM (TEE);  Surgeon: Skeet Latch, MD;  Location: [place];  Service: Cardiovascular;  Laterality: N/A;  . TEE WITHOUT CARDIOVERSION N/A 10/04/2015   Procedure: TRANSESOPHAGEAL ECHOCARDIOGRAM (TEE);  Surgeon: Rexene Alberts, MD;  Location: [place];  Service: Open Heart Surgery;  Laterality: N/A;  . WOUND EXPLORATION N/A 10/05/2015   Procedure: Sternal Incision;  Surgeon: Rexene Alberts, MD;  Location: [place];  Service: Open Heart Surgery;  Laterality: N/A;   Current Outpatient Medications on File Prior to Visit  Medication Sig Dispense Refill  . allopurinol (ZYLOPRIM) 100 MG tablet TAKE ONE (1) TABLET EACH DAY 90 tablet 2  . colchicine 0.6 MG tablet Take 1 tablet (0.6 mg total) by mouth daily. 30 tablet 5  . cyclobenzaprine (FLEXERIL) 5 MG tablet Take 1 tablet (5 mg total) by mouth 3 (three) times daily as needed for muscle spasms. 30 tablet 0  . docusate sodium (COLACE) 100 MG capsule Take 1 capsule (100 mg total) by mouth daily as needed for moderate constipation. 60 capsule 2  . finasteride (PROSCAR) 5 MG tablet TAKE ONE (1) TABLET BY MOUTH EVERY DAY 90 tablet 3  . furosemide (LASIX) 40 MG tablet Take 2 tablets (80 mg total) by mouth 2 (two) times daily. 120 tablet 2  . furosemide (LASIX) 40 MG tablet Take 2 tablets (80 mg total) by mouth 2 (two) times  daily. 120 tablet 2  . magnesium oxide (MAG-OX) 400 MG tablet Take 1 tablet (400 mg total) by mouth daily. 30 tablet 0  . metoprolol tartrate (LOPRESSOR) 25 MG tablet TAKE ONE-HALF TABLET BY MOUTH TWICE DAILY 90 tablet 3  . OVER THE COUNTER MEDICATION Neuroquell: Take 1 capsule by mouth once a day for immune health    . polyethylene glycol (MIRALAX) packet Take 17 g by mouth daily. 30 each 0  . potassium chloride (KLOR-CON) 20 MEQ packet Take 40 mEq by mouth daily. 60 packet 0  . pravastatin (PRAVACHOL) 40 MG tablet TAKE ONE (1) TABLET EACH DAY AT 6 P.M. 30 tablet 9  . spironolactone (ALDACTONE) 25 MG tablet Take 1 tablet (25 mg total) by mouth daily. [place]  tablet 0  . traMADol (ULTRAM) 50 MG tablet TAKE ONE TABLET BY MOUTH EVERY 6 HOURS AS NEEDED FOR MODERATE PAIN 30 tablet 0  . warfarin (COUMADIN) 4 MG tablet Take 1 tablet (4 mg total) by mouth daily. Do not take with any other warfarin unless Dr. Dennard Schaumann instructs. 30 tablet 3  . Wheat Dextrin (BENEFIBER) POWD Take by mouth See admin instructions. Mix one teaspoonful into 6-8 ounces of fluid/milk and drink once daily     No current facility-administered medications on file prior to visit.    No Known Allergies Social History   Socioeconomic History  . Marital status: Married    Spouse name: Not on file  . Number of children: 4  . Years of education: Not on file  . Highest education level: Not on file  Occupational History    Employer: RETIRED  Social Needs  . Financial resource strain: Not on file  . Food insecurity:    Worry: Not on file    Inability: Not on file  . Transportation needs:    Medical: Not on file    Non-medical: Not on file  Tobacco Use  . Smoking status: Former Smoker    Types: Cigarettes, Pipe, Landscape architect  . Smokeless tobacco: Former Systems developer    Types: Chew  . Tobacco comment: QUIT SMOKING  MANY YEARS AGO"  Substance and Sexual Activity  . Alcohol use: No  . Drug use: No  . Sexual activity: Not on file  Lifestyle    . Physical activity:    Days per week: Not on file    Minutes per session: Not on file  . Stress: Not on file  Relationships  . Social connections:    Talks on phone: Not on file    Gets together: Not on file    Attends religious service: Not on file    Active member of club or organization: Not on file    Attends meetings of clubs or organizations: Not on file    Relationship status: Not on file  . Intimate partner violence:    Fear of current or ex partner: Not on file    Emotionally abused: Not on file    Physically abused: Not on file    Forced sexual activity: Not on file  Other Topics Concern  . Not on file  Social History Narrative   ** Merged History Encounter **       Four adopted children.  Lives alone.      Review of Systems  All other systems reviewed and are negative.      Objective:   Physical Exam  Constitutional: He appears well-developed and well-nourished.  Eyes: No scleral icterus.  Cardiovascular: Normal rate and regular rhythm.  Murmur heard. Pulmonary/Chest: Effort normal and breath sounds normal.  Abdominal: Soft. Bowel sounds are normal. He exhibits no distension and no mass. There is no tenderness. There is no rebound and no guarding.  Musculoskeletal: He exhibits edema.  Skin: No rash noted. No erythema.  Vitals reviewed.         Assessment & Plan:  Itching - Plan: CBC with Differential/Platelet, COMPLETE METABOLIC PANEL WITH GFR  Patient reports itching on the volar surfaces of both biceps of both forearms as well as on anterior surfaces of both shins.  His exam is significant for chronic venous stasis dermatitis on both shins however this is mild and seems to be improving.  There is minimal edema bilaterally.  The majority of the itching is confined  to his upper extremities where there is no visible abnormality.  I will check a CBC to rule out polycythemia and I will check a CMP to evaluate liver and kidney function.  However I suspect  lab work will be normal.  I will treat the patient symptomatically with triamcinolone 0.1% cream apply twice daily as needed.  Consider hydroxyzine if worsening but I would like to avoid centrally acting medications due to his age.

## 2018-04-27 NOTE — Addendum Note (Signed)
Addended by: Shary Decamp B on: 04/27/2018 04:37 PM   Modules accepted: Orders

## 2018-04-28 ENCOUNTER — Encounter: Payer: Self-pay | Admitting: Speech Pathology

## 2018-04-28 ENCOUNTER — Other Ambulatory Visit: Payer: Self-pay

## 2018-04-28 LAB — COMPLETE METABOLIC PANEL WITH GFR
AG Ratio: 1 (calc) (ref 1.0–2.5)
ALBUMIN MSPROF: 3.4 g/dL — AB (ref 3.6–5.1)
ALKALINE PHOSPHATASE (APISO): 119 U/L — AB (ref 40–115)
ALT: 16 U/L (ref 9–46)
AST: 29 U/L (ref 10–35)
BILIRUBIN TOTAL: 0.8 mg/dL (ref 0.2–1.2)
BUN/Creatinine Ratio: 25 (calc) — ABNORMAL HIGH (ref 6–22)
BUN: 31 mg/dL — AB (ref 7–25)
CO2: 25 mmol/L (ref 20–32)
CREATININE: 1.26 mg/dL — AB (ref 0.70–1.11)
Calcium: 9.3 mg/dL (ref 8.6–10.3)
Chloride: 102 mmol/L (ref 98–110)
GFR, Est African American: 59 mL/min/{1.73_m2} — ABNORMAL LOW (ref >=60)
GFR, Est Non African American: 51 mL/min/{1.73_m2} — ABNORMAL LOW (ref >=60)
GLUCOSE: 84 mg/dL (ref 65–99)
Globulin: 3.3 g/dL (calc) (ref 1.9–3.7)
Potassium: 4 mmol/L (ref 3.5–5.3)
Sodium: 138 mmol/L (ref 135–146)
Total Protein: 6.7 g/dL (ref 6.1–8.1)

## 2018-04-28 LAB — CBC WITH DIFFERENTIAL/PLATELET
BASOS ABS: 79 {cells}/uL (ref 0–200)
BASOS PCT: 1 %
EOS ABS: 332 {cells}/uL (ref 15–500)
EOS PCT: 4.2 %
HCT: 41.3 % (ref 38.5–50.0)
HEMOGLOBIN: 14 g/dL (ref 13.2–17.1)
LYMPHS ABS: 2654 {cells}/uL (ref 850–3900)
MCH: 31.5 pg (ref 27.0–33.0)
MCHC: 33.9 g/dL (ref 32.0–36.0)
MCV: 93 fL (ref 80.0–100.0)
MONOS PCT: 8.2 %
MPV: 11.3 fL (ref 7.5–12.5)
NEUTROS ABS: 4187 {cells}/uL (ref 1500–7800)
Neutrophils Relative %: 53 %
PLATELETS: 123 10*3/uL — AB (ref 140–400)
RBC: 4.44 10*6/uL (ref 4.20–5.80)
RDW: 13.3 % (ref 11.0–15.0)
Total Lymphocyte: 33.6 %
WBC mixed population: 648 cells/uL (ref 200–950)
WBC: 7.9 10*3/uL (ref 3.8–10.8)

## 2018-04-28 NOTE — Therapy (Signed)
[place] MAIN [facility] SERVICES [address] [place], [place], [address] Phone: [phone]   Fax:  [phone]  Speech Language Pathology Evaluation  Patient Details  Name: [name] MRN: [id number] Date of Birth: [birth year] Referring Provider: Jonathon Bradshaw   Encounter Date: 04/27/2018  End of Session - 04/28/18 1301    Visit Number  1    Number of Visits  1    Date for SLP Re-Evaluation  04/27/18    Activity Tolerance  Patient tolerated treatment well       Past Medical History:  Diagnosis Date  . Atelectasis of right lung   . Chronic diastolic (congestive) heart failure ([place])    a. 10/2013 Echo: EF 55-60%, basal-mid anteroseptal and basal-mid inferoseptal HK; b. 02/2017 Echo: EF 60-65%, Gr3 DD.  Marland Kitchen Chronic fatigue   . Chronic subdural hematoma ([place])    a. 12/2015 Head CT: chronic right holo-hemispheric SDH w/o midline shift.  . CKD (chronic kidney disease), stage III ([place])   . Coronary artery disease    a. 11/2014 NSTEMI: DESx 2 placed to LAD and RCA; b. 08/2015 Cath: LAD patent stent, LCX 9m, RCA patent stent, 36m.  . Diabetes mellitus, type II ([place])    a. HgA1c 6.8 in 03/2015  . ED (erectile dysfunction)   . Gout   . Hemangioma of liver 08/02/2015   12 mm enhancing lesion seen on CT - suspicious for benign hemangioma but not diagnostic - MRI recommended  . HLD (hyperlipidemia)   . Hypertension   . Hypertensive heart disease   . Ischemic finger ulcer ([place])    a. 03/2017 Seen for digital ulcers - felt to be embolic in setting of PAF. Pt on coumadin.  . Kidney stones   . Lower extremity edema    a. chronic  . OA (osteoarthritis)   . Orthostatic hypotension   . PAF (paroxysmal atrial fibrillation) ([place])    a. 10/2015 s/p DCCV;  b. CHA2DS2VASc = 6--> on coumadin; c. 10/2015 Echo: sev dil LA.  . Prostate cancer ([place])   . Pulmonary HTN ([place])   . Pulmonary hypertension ([place])    a. 10/2015 Echo: PASP 42mmHg.  Marland Kitchen Renal insufficiency    . Severe mitral regurgitation    a.  10/2015 s/p minimally invasive MV repair; b. 10/2015 Echo: mod MS, mean grad 46mmHg; c. 02/2017 Echo: EF 60-65%, Gr3 DD, mod Ca2+ MV annulus, mod to sev dil LA, mod dil RA, mod TR. PASP 12mmHg.  Marland Kitchen Spinal stenosis   . SVT (supraventricular tachycardia) (HCC)    a. recurrent, usually responsive to vagal manuevers  . Tricuspid regurgitation    a. 10/2015 Echo: mild to mod TR.    Past Surgical History:  Procedure Laterality Date  . BACK SURGERY    . Back surgery x 2    . CARDIAC CATHETERIZATION N/A 08/08/2015   Procedure: Right/Left Heart Cath and Coronary Angiography;  Surgeon: Sherren Mocha, MD;  Location: [place] CV LAB;  Service: Cardiovascular;  Laterality: N/A;  . CARDIAC SURGERY     2 cardiac stents  . CARDIOVERSION N/A 10/20/2015   Procedure: CARDIOVERSION;  Surgeon: Pixie Casino, MD;  Location: [place];  Service: Cardiovascular;  Laterality: N/A;  . CERVICAL SPINE SURGERY    . CHOLECYSTECTOMY N/A 07/08/2016   Procedure: LAPAROSCOPIC CHOLECYSTECTOMY WITH INTRAOPERATIVE CHOLANGIOGRAM;  Surgeon: Georganna Skeans, MD;  Location: [place];  Service: General;  Laterality: N/A;  . CORONARY ARTERY BYPASS GRAFT    .  ESOPHAGOGASTRODUODENOSCOPY (EGD) WITH PROPOFOL N/A 03/19/2018   Procedure: ESOPHAGOGASTRODUODENOSCOPY (EGD) WITH PROPOFOL;  Surgeon: Fred Bellows, MD;  Location: [facility] ENDOSCOPY;  Service: Gastroenterology;  Laterality: N/A;  . EYE SURGERY    . INTRAOPERATIVE TRANSESOPHAGEAL ECHOCARDIOGRAM  10/05/2015   Procedure: INTRAOPERATIVE TRANSESOPHAGEAL ECHOCARDIOGRAM;  Surgeon: Rexene Alberts, MD;  Location: [facility] OR;  Service: Open Heart Surgery;;  . JOINT REPLACEMENT     shoulder  . LEFT HEART CATHETERIZATION WITH CORONARY ANGIOGRAM N/A 11/16/2014   Procedure: LEFT HEART CATHETERIZATION WITH CORONARY ANGIOGRAM;  Surgeon: Troy Sine, MD;  Location: [facility] CATH LAB;  Service: Cardiovascular;  Laterality: N/A;  . MITRAL VALVE REPAIR Right 10/04/2015    Procedure: MINIMALLY INVASIVE MITRAL VALVE REPAIR (MVR);  Surgeon: Rexene Alberts, MD;  Location: [place];  Service: Open Heart Surgery;  Laterality: Right;  . Rt rotator cuff repair    . TEE WITHOUT CARDIOVERSION N/A 06/22/2015   Procedure: TRANSESOPHAGEAL ECHOCARDIOGRAM (TEE);  Surgeon: Skeet Latch, MD;  Location: [place];  Service: Cardiovascular;  Laterality: N/A;  . TEE WITHOUT CARDIOVERSION N/A 10/04/2015   Procedure: TRANSESOPHAGEAL ECHOCARDIOGRAM (TEE);  Surgeon: Rexene Alberts, MD;  Location: [place];  Service: Open Heart Surgery;  Laterality: N/A;  . WOUND EXPLORATION N/A 10/05/2015   Procedure: Sternal Incision;  Surgeon: Rexene Alberts, MD;  Location: [facility];  Service: Open Heart Surgery;  Laterality: N/A;    There were no vitals filed for this visit.      SLP Evaluation [facility] - 04/28/18 0001      SLP Visit Information   SLP Received On  04/27/18    Referring Provider  Bradshaw, Fred Mech    Onset Date  04/07/2018    Medical Diagnosis  Dysphagia      Subjective   Subjective  The patient is primarily concerned with "all the phlegm".    Patient/Family Stated Goal  Patient: get rid of the phlegm.  Spouse: better intelligibility.        General Information   HPI  She was initially referred and seen on 02/26/18 for dysphagia. She has been seen by a GI back in 2013 for dysphagia. At that point of time there was concern for food impaction with apple. EGD in 2007 showed H. pylori gastritis. Pyloric channel ulcer with a clean base.A modified barium swallow in July 2018 demonstrated deep laryngeal penetration with trace tracheal aspiration with clears with a normal cough reflux on thin liquids with honey consistency laryngeal penetration with clearing with coughing and with pured consistency laryngeal penetration with clearing and coughing barium tablet with applesauce was within normal limits. A barium esophagogram was performed in June 2018. There was a laryngeal penetration of the  barium is prompted only of mild cough reflex.Seen by SLP and suggested softer and moister foods , clear thropat , ENT eval for chronic hoarsness. Also suggested referral for therapeutic intervention for dysphonia       Prior Functional Status   Cognitive/Linguistic Baseline  Information not available      Oral Motor/Sensory Function   Overall Oral Motor/Sensory Function  Impaired    Overall Oral Motor/Sensory Function  General weakness      Motor Speech   Overall Motor Speech  Impaired    Respiration  Impaired    Level of Impairment  Conversation    Phonation  Low vocal intensity;Hoarse;Wet    Resonance  Hypernasality    Articulation  Impaired    Level of Impairment  Sentence    Intelligibility  Intelligibility  reduced    Conversation  50-74% accurate      SWALLOWING: Per MBS 02/19/2017 this 84 year old man; with complicated medical history and recent barium swallow study showing laryngeal penetration; is presenting with moderate oropharyngeal dysphagia characterized by slow/disorganized oral management, delayed swallow initiation, decreased tongue base retraction, absent epiglottic inversion, moderate vallecular residue, and deep laryngeal penetration of pharyngeal residue.  The penetration occurs across all consistencies, typically of residue vs. as he swallows.  The patient demonstrated spontaneous and effective throat clearing in 80% of occurrences.   The patient was able to swallow a barium tablet embedded in moist puree (applesauce).  Although frank aspiration was not observed during this study, given the extent of pharyngeal residue across consistencies, the patient is at significant risk for at least trace chronic aspiration.  In view of the patient's extensive history of dysphagia and known risk for laryngeal penetration, it is difficult to determine the patient's risk for negative sequelae to aspiration.   Recommend continuing the patient's current diet and swallowing recommendations.   The patient should be closely monitored for signs/symptoms of pneumonia and remain as physically active as possible.  It is doubtful that the patient has the stamina and endurance to benefit from a swallowing exercise routine.  The patient was counseled to try his medication in a bolus of applesauce.  In addition, the patient would benefit from referral to ENT secondary hoarseness.   SLP Education - 04/28/18 1301    Education Details  results and recommendations    Person(s) Educated  Patient;Spouse    Methods  Explanation    Comprehension  Verbalized understanding           Plan - 04/28/18 1301    Clinical Impression Statement  84 year old man was referred to speech therapy to see if any form of maneuvers can help him to get food down easier.  The patient had an MBS in 2018 with recommendations to soften/moisten foods as needed and take medication in applesauce.  The patient's wife reports that she chops his food finely and gives him protein supplements to replace the meat that he cannot manage.  The patient reports that he is able to take his medication with buttermilk.  His wife agrees that he is swallowing meals and pills with little difficulty.  The patient is reporting poor appetite as a reason for any decrease in oral intake.  The patient's primary concern today has been the mucus he senses in his throat.  His wife continues to be concerned that she has difficulty understanding what he says.  The patient has seen ENT with new prescription for a nasal spray.  The patient and his wife were counseled that the apparent mucus does not need to interfere with oral intake or intelligible speech.  He was counseled to continue to modify foods as needed for comfort, take medication with buttermilk, and that he needs to speak slower, louder, and more carefully.  He was able to demonstrate improved vocal quality and intelligibility when implementing the strategies.      Speech Therapy Frequency  One time  visit    Treatment/Interventions  Patient/family education    Consulted and Agree with Plan of Care  Patient;Family member/caregiver    Family Member Consulted  Spouse       Patient will benefit from skilled therapeutic intervention in order to improve the following deficits and impairments:   Dysphagia, oropharyngeal phase - Plan: SLP plan of care cert/re-cert    Problem  List Patient Active Problem List   Diagnosis Date Noted  . AKI (acute kidney injury) ([place]) 01/02/2018  . Finger ulcer, limited to breakdown of skin ([place]) 03/12/2017  . Closed fracture of lower end of right ulna 11/25/2016  . SDH (subdural hematoma) ([place]) 11/07/2016  . Subdural hematoma ([place]) 11/07/2016  . Chronic diastolic CHF (congestive heart failure) ([place]) 07/03/2016  . Gallstone pancreatitis 07/03/2016  . Hypertensive heart disease   . Syncope 12/11/2015  . Orthostatic hypotension   . Weakness 12/09/2015  . PAF (paroxysmal atrial fibrillation) ([place])   . CAD (coronary artery disease)   . Pressure ulcer 10/19/2015  . Long term (current) use of anticoagulants [Z79.01] 10/17/2015  . S/P minimally invasive mitral valve repair 10/04/2015  . Physical deconditioning   . Hemangioma of liver 08/02/2015  . Diabetes mellitus, type II ([place])   . Coronary artery disease   . Pulmonary HTN ([place])   . Lower extremity edema   . History of PSVT (paroxysmal supraventricular tachycardia) 11/15/2014  . Dyslipidemia 11/15/2014  . History of prostate cancer 11/15/2014  . Dry eye 09/03/2013  . TBI (traumatic brain injury) ([place]) 11/25/2012  . Orbit fracture ([place]) 11/25/2012  . Maxillary sinus fracture ([place]) 11/25/2012  . Frequent falls 11/25/2012  . OA (osteoarthritis)   . Metabolic syndrome   . ED (erectile dysfunction)   . Gout   . Spinal stenosis   . Hypertension 05/07/2012   Leroy Sea, MS/CCC- SLP  Lou Miner 04/28/2018, 1:12 PM  [place] MAIN [facility]  SERVICES [address] [place], [place], [address] Phone: [phone]   Fax:  [phone]  Name: [name] MRN: [id number] Date of Birth: 05-11-1929

## 2018-04-29 ENCOUNTER — Other Ambulatory Visit: Payer: Self-pay | Admitting: Family Medicine

## 2018-05-04 ENCOUNTER — Encounter: Payer: Medicare Other | Admitting: Speech Pathology

## 2018-05-07 ENCOUNTER — Telehealth: Payer: Self-pay | Admitting: Cardiology

## 2018-05-07 ENCOUNTER — Encounter: Payer: Medicare Other | Admitting: Speech Pathology

## 2018-05-07 NOTE — Telephone Encounter (Signed)
New Message        Pt c/o swelling: STAT is pt has developed SOB within 24 hours  1) How much weight have you gained and in what time span? 11 pds in 20 days  2) If swelling, where is the swelling located? Right foot  3) Are you currently taking a fluid pill? Yes  4) Are you currently SOB? SOB  5) Do you have a log of your daily weights (if so, list)? Yes              Today-201.2  6) Have you gained 3 pounds in a day or 5 pounds in a week? Yes  7) Have you traveled recently? Yes

## 2018-05-07 NOTE — Telephone Encounter (Signed)
Called the pt in response to [place] with [facility] leaving [place] a message to call the pt.. I advised him that she alerted [place] that he was having weight gain and edema but he reported that he was feeling well and his weight does go up and down but he feels it is controlled with his diuretics. He says that he does have ankle edema but it gets better with his meds and elevating them. He does report that he does not watch his diet well but will try harder. He is returning to see Kerin Ransom PA on 05/13/18. Will call if he has any problems prior to that appt. Pr denies SOB.

## 2018-05-07 NOTE — Telephone Encounter (Signed)
Returned call to [place] with [facility].

## 2018-05-07 NOTE — Telephone Encounter (Signed)
Follow up    Elmyra Ricks with [facility] is returning call. She says that its okay to follow up with patient in reference to the weight gain and swelling.

## 2018-05-12 ENCOUNTER — Encounter: Payer: Medicare Other | Admitting: Speech Pathology

## 2018-05-13 ENCOUNTER — Ambulatory Visit (INDEPENDENT_AMBULATORY_CARE_PROVIDER_SITE_OTHER): Payer: Medicare Other | Admitting: Cardiology

## 2018-05-13 ENCOUNTER — Encounter: Payer: Self-pay | Admitting: Cardiology

## 2018-05-13 VITALS — BP 96/54 | HR 52 | Ht 68.0 in | Wt 193.6 lb

## 2018-05-13 DIAGNOSIS — Z9861 Coronary angioplasty status: Secondary | ICD-10-CM

## 2018-05-13 DIAGNOSIS — I5032 Chronic diastolic (congestive) heart failure: Secondary | ICD-10-CM

## 2018-05-13 DIAGNOSIS — Z7901 Long term (current) use of anticoagulants: Secondary | ICD-10-CM

## 2018-05-13 DIAGNOSIS — N183 Chronic kidney disease, stage 3 unspecified: Secondary | ICD-10-CM

## 2018-05-13 DIAGNOSIS — Z9889 Other specified postprocedural states: Secondary | ICD-10-CM | POA: Diagnosis not present

## 2018-05-13 DIAGNOSIS — I2510 Atherosclerotic heart disease of native coronary artery without angina pectoris: Secondary | ICD-10-CM

## 2018-05-13 DIAGNOSIS — I272 Pulmonary hypertension, unspecified: Secondary | ICD-10-CM | POA: Diagnosis not present

## 2018-05-13 DIAGNOSIS — I480 Paroxysmal atrial fibrillation: Secondary | ICD-10-CM

## 2018-05-13 DIAGNOSIS — Z8679 Personal history of other diseases of the circulatory system: Secondary | ICD-10-CM

## 2018-05-13 DIAGNOSIS — I951 Orthostatic hypotension: Secondary | ICD-10-CM

## 2018-05-13 MED ORDER — LORATADINE 10 MG PO TABS: 10.0000 mg | ORAL_TABLET | Freq: Every day | ORAL | 3 refills | Status: DC | PRN

## 2018-05-13 NOTE — Progress Notes (Signed)
05/13/2018 [name]   March 29, 1929  [id number]  Primary Physician Fred Schaumann Cammie Mcgee, MD Primary Cardiologist: dr [name]  HPI:  Pt is a 84 y.o. male with a history of MR s/p MV repair (2017), PAF on coumadin, chronic SDH, CKD III, CAD s/p DES to LAD/RCA in 2016, HTN, HL, PSVT, PAH, OA, fatigue, orthostatic hypotension, prostate cancer, ischemic ulcers on his fingers in Aug 2018 (felt to be secondary to spasm) seen in the office today for "a check up". There was a note from [name] that he had gained some weight a week or so ago. The pt says he adjusts his Lasix when this happens and that takes care of it. The truth of tihs is borne out today based on his weights and symptoms. Overall he says he is doing well. He and his wife spent some time recently at [name] [place] and had a loveey time. He has had issues with sinus congestion and phlegm in the morning. He has chronic LE edema.    Current Outpatient Medications  Medication Sig Dispense Refill  . allopurinol (ZYLOPRIM) 100 MG tablet TAKE ONE (1) TABLET EACH DAY 90 tablet 2  . cyclobenzaprine (FLEXERIL) 5 MG tablet Take 1 tablet (5 mg total) by mouth 3 (three) times daily as needed for muscle spasms. 30 tablet 0  . docusate sodium (COLACE) 100 MG capsule Take 1 capsule (100 mg total) by mouth daily as needed for moderate constipation. 60 capsule 2  . finasteride (PROSCAR) 5 MG tablet Take 5 mg by mouth at bedtime.    . furosemide (LASIX) 40 MG tablet Take 2 tablets (80 mg total) by mouth 2 (two) times daily. 120 tablet 2  . metoprolol tartrate (LOPRESSOR) 25 MG tablet TAKE ONE-HALF TABLET BY MOUTH TWICE DAILY 90 tablet 3  . OVER THE COUNTER MEDICATION [name]: Take 1 capsule by mouth once a day for immune health    . potassium chloride (KLOR-CON) 20 MEQ packet Take 40 mEq by mouth daily. 60 packet 0  . pravastatin (PRAVACHOL) 40 MG tablet TAKE ONE (1) TABLET EACH DAY AT 6 P.M. 30 tablet 9  . spironolactone (ALDACTONE) 25 MG tablet  Take 1 tablet (25 mg total) by mouth daily. 90 tablet 3  . traMADol (ULTRAM) 50 MG tablet TAKE ONE TABLET BY MOUTH EVERY 6 HOURS AS NEEDED FOR MODERATE PAIN 30 tablet 0  . triamcinolone cream (KENALOG) 0.1 % Apply 1 application topically 2 (two) times daily. 30 g 0  . warfarin (COUMADIN) 4 MG tablet Take 1 tablet (4 mg total) by mouth daily. Do not take with any other warfarin unless Dr. Dennard Schaumann instructs. 30 tablet 3  . loratadine (CLARITIN) 10 MG tablet Take 1 tablet (10 mg total) by mouth daily as needed for allergies. 30 tablet 3   No current facility-administered medications for this visit.     No Known Allergies  Past Medical History:  Diagnosis Date  . Atelectasis of right lung   . Chronic diastolic (congestive) heart failure ([place])    a. 10/2013 Echo: EF 55-60%, basal-mid anteroseptal and basal-mid inferoseptal HK; b. 02/2017 Echo: EF 60-65%, Gr3 DD.  Marland Kitchen Chronic fatigue   . Chronic subdural hematoma ([place])    a. 12/2015 Head CT: chronic right holo-hemispheric SDH w/o midline shift.  . CKD (chronic kidney disease), stage III ([place])   . Coronary artery disease    a. 11/2014 NSTEMI: DESx 2 placed to LAD and RCA; b. 08/2015 Cath: LAD patent stent, LCX  86m, RCA patent stent, 1m.  . Diabetes mellitus, type II ([place])    a. HgA1c 6.8 in 03/2015  . ED (erectile dysfunction)   . Gout   . Hemangioma of liver 08/02/2015   12 mm enhancing lesion seen on CT - suspicious for benign hemangioma but not diagnostic - MRI recommended  . HLD (hyperlipidemia)   . Hypertension   . Hypertensive heart disease   . Ischemic finger ulcer ([place])    a. 03/2017 Seen for digital ulcers - felt to be embolic in setting of PAF. Pt on coumadin.  . Kidney stones   . Lower extremity edema    a. chronic  . OA (osteoarthritis)   . Orthostatic hypotension   . PAF (paroxysmal atrial fibrillation) ([place])    a. 10/2015 s/p DCCV;  b. CHA2DS2VASc = 6--> on coumadin; c. 10/2015 Echo: sev dil LA.  . Prostate cancer ([place])   .  Pulmonary HTN ([place])   . Pulmonary hypertension ([place])    a. 10/2015 Echo: PASP 79mmHg.  Marland Kitchen Renal insufficiency   . Severe mitral regurgitation    a.  10/2015 s/p minimally invasive MV repair; b. 10/2015 Echo: mod MS, mean grad 64mmHg; c. 02/2017 Echo: EF 60-65%, Gr3 DD, mod Ca2+ MV annulus, mod to sev dil LA, mod dil RA, mod TR. PASP 10mmHg.  Marland Kitchen Spinal stenosis   . SVT (supraventricular tachycardia) (HCC)    a. recurrent, usually responsive to vagal manuevers  . Tricuspid regurgitation    a. 10/2015 Echo: mild to mod TR.    Social History   Socioeconomic History  . Marital status: Married    Spouse name: Not on file  . Number of children: 4  . Years of education: Not on file  . Highest education level: Not on file  Occupational History    Employer: RETIRED  Social Needs  . Financial resource strain: Not on file  . Food insecurity:    Worry: Not on file    Inability: Not on file  . Transportation needs:    Medical: Not on file    Non-medical: Not on file  Tobacco Use  . Smoking status: Former Smoker    Types: Cigarettes, Pipe, Landscape architect  . Smokeless tobacco: Former Systems developer    Types: Chew  . Tobacco comment: QUIT SMOKING  MANY YEARS AGO"  Substance and Sexual Activity  . Alcohol use: No  . Drug use: No  . Sexual activity: Not on file  Lifestyle  . Physical activity:    Days per week: Not on file    Minutes per session: Not on file  . Stress: Not on file  Relationships  . Social connections:    Talks on phone: Not on file    Gets together: Not on file    Attends religious service: Not on file    Active member of club or organization: Not on file    Attends meetings of clubs or organizations: Not on file    Relationship status: Not on file  . Intimate partner violence:    Fear of current or ex partner: Not on file    Emotionally abused: Not on file    Physically abused: Not on file    Forced sexual activity: Not on file  Other Topics Concern  . Not on file  Social History  Narrative   ** Merged History Encounter **       Four adopted children.  Lives alone.      Family History  Problem Relation  Age of Onset  . Heart failure Mother 66  . Heart attack Mother   . Heart attack Brother      Review of Systems: General: negative for chills, fever, night sweats or weight changes.  Cardiovascular: negative for chest pain, orthopnea, palpitations, paroxysmal nocturnal dyspnea  Dermatological: negative for rash Respiratory: negative for cough or wheezing Urologic: negative for hematuria Abdominal: negative for nausea, vomiting, diarrhea, bright red blood per rectum, melena, or hematemesis Neurologic: negative for visual changes, syncope, or dizziness All other systems reviewed and are otherwise negative except as noted above.    Blood pressure (!) 96/54, pulse (!) 52, height 5\' 8"  (1.727 m), weight 193 lb 9.6 oz (87.8 kg), SpO2 93 %.  General appearance: alert, cooperative, appears stated age and no distress Neck: no carotid bruit and no JVD Lungs: decreased breath sounds Rt base-1/3 Heart: regular rate and rhythm and 2/6 systolic murmur AOV, LSB Extremities: 1+ bilateral LE edema Skin: warm and dry Neurologic: Grossly normal   ASSESSMENT AND PLAN:   Chronic diastolic CHF (congestive heart failure) (HCC) He seems compensated today- he self adjusts his Lasix appropriately   CAD S/P PCI 2016  11/2014 NSTEMI: DESx 2 placed to LAD and RCA   PAF (paroxysmal atrial fibrillation) (HCC) Sinus rhythm-sinus bradycardia  Chronic anticoagulation Coumadin- CHADS VASC=5  S/P minimally invasive mitral valve repair Complex valvuloplasty including artificial Gore-tex neochord placement x6 with plication closure of cleft between P2 and P3 and 30 mm Sorin Memo 3D ring annuloplasty via right mini thoracotomy approach. Echo June 2019- MV prosthesis functioning normally, EF 55-60%, mild AS.Marland Kitchen   Orthostatic hypotension He does say at times he gets dizzy walking from  his car to the house. No syncope.    PLAN  I suggested Mr Vandevelde take his Proscar at night and instructed him to be careful going from sitting to standing. I told him he could try regular Claritin if needed for sinus congestion. F/U Dr [name] in 6 months. Recent labs from Sept 23'd reviewed.   Kerin Ransom PA-C 05/13/2018 2:05 PM

## 2018-05-13 NOTE — Assessment & Plan Note (Signed)
11/2014 NSTEMI: DESx 2 placed to LAD and RCA

## 2018-05-13 NOTE — Assessment & Plan Note (Signed)
He seems compensated today- he self adjusts his Lasix appropriately

## 2018-05-13 NOTE — Patient Instructions (Addendum)
Medication Instructions:  TAKE- Finasteride(Proscar) 5 mg at bedtime  If you need a refill on your cardiac medications before your next appointment, please call your pharmacy.  Labwork: None Ordered   You will NOT need to fast   If you have labs (blood work) drawn today and your tests are completely normal, you will receive your results only by: Marland Kitchen MyChart Message (if you have MyChart) OR . A paper copy in the mail If you have any lab test that is abnormal or we need to change your treatment, we will call you to review the results.  Testing/Procedures: None Ordered  Follow-Up: At [facility], you and your health needs are our priority.  As part of our continuing mission to provide you with exceptional heart care, we have created designated Provider Care Teams.  These Care Teams include your primary Cardiologist (physician) and Advanced Practice Providers (APPs -  Physician Assistants and Nurse Practitioners) who all work together to provide you with the care you need, when you need it. . You will need a follow up appointment in 6 Months.  Please call our office 2 months in advance([phone]) to schedule this appointment.  You may see  DR Percival Spanish or one of the following Advanced Practice Providers on your designated Care Team:   . Rosaria Ferries, PA-C . Jory Sims, DNP, ANP   Thank you for choosing [facility]!!

## 2018-05-13 NOTE — Assessment & Plan Note (Addendum)
Coumadin- CHADS VASC=5

## 2018-05-13 NOTE — Assessment & Plan Note (Signed)
Complex valvuloplasty including artificial Gore-tex neochord placement x6 with plication closure of cleft between P2 and P3 and 30 mm Sorin Memo 3D ring annuloplasty via right mini thoracotomy approach. Echo June 2019- MV prosthesis functioning normally, EF 55-60%, mild AS.Marland Kitchen

## 2018-05-13 NOTE — Assessment & Plan Note (Signed)
Sinus rhythm-sinus bradycardia

## 2018-05-13 NOTE — Assessment & Plan Note (Signed)
He does say at times he gets dizzy walking from his car to the house. No syncope.

## 2018-05-14 ENCOUNTER — Encounter: Payer: Medicare Other | Admitting: Speech Pathology

## 2018-05-18 ENCOUNTER — Encounter: Payer: Self-pay | Admitting: Family Medicine

## 2018-05-18 ENCOUNTER — Ambulatory Visit (INDEPENDENT_AMBULATORY_CARE_PROVIDER_SITE_OTHER): Payer: Medicare Other | Admitting: Family Medicine

## 2018-05-18 VITALS — BP 110/60 | HR 75 | Temp 97.6°F | Resp 16 | Ht 67.0 in | Wt 209.0 lb

## 2018-05-18 DIAGNOSIS — R29898 Other symptoms and signs involving the musculoskeletal system: Secondary | ICD-10-CM | POA: Diagnosis not present

## 2018-05-18 DIAGNOSIS — Z7409 Other reduced mobility: Secondary | ICD-10-CM | POA: Diagnosis not present

## 2018-05-18 DIAGNOSIS — I4891 Unspecified atrial fibrillation: Secondary | ICD-10-CM

## 2018-05-18 DIAGNOSIS — R296 Repeated falls: Secondary | ICD-10-CM | POA: Diagnosis not present

## 2018-05-18 LAB — PT WITH INR/FINGERSTICK
INR, fingerstick: 2.3 ratio — ABNORMAL HIGH
PT FINGERSTICK: 27.5 s — AB (ref 10.5–13.1)

## 2018-05-18 NOTE — Progress Notes (Signed)
Subjective:    Patient ID: [name], male    DOB: 1929/03/22, 84 y.o.   MRN: [id number]  HPI  12/15/17 However, surprisingly at that visit, his creatinine was better than his baseline.  Therefore I made no changes in his medication but I did quickly recheck his renal function again on April 10 and found to be 1.59 which again is approximate to the patient's baseline.  He is here today to recheck his INR for his chronic anticoagulation due to atrial fibrillation.  He is also due to monitor his weight, his blood pressure, his renal function, as well as his glycemic control for his diabetes.  At his last visit, he weighed 207 pounds.  Today he is 205.  However the patient independently adjust his fluid pills from what I can gather today.  Most days he takes 80 mg of Lasix twice daily in addition to spironolactone.  However as the patient states, sometimes he will only take 1 Lasix pill in the evening if he feels like he has diuresed too much.  From what I can gather, most days he takes 80 mg twice daily with the spironolactone.  His weight has stayed stable.  His lungs are clear to auscultation bilaterally and there is no evidence of fluid overload.  His INR today is supratherapeutic at 3.4.  He is taking 4 mg of Coumadin every day per his report.  At that time, my plan was: Patient's INR is supratherapeutic.  Decrease Coumadin to 2 mg on Monday and Friday and 4 mg on all other days.  Recheck INR in 4 weeks.  Patient's heart rate varies between 50 and 60 bpm.  Although bradycardic, he is asymptomatic.  Therefore I will make no changes in his rate control medication metoprolol.  Blood pressure today is well controlled at 118/60.  Regarding his diabetes, I will check a hemoglobin A1c.  Given his advanced age, anything below 7.5 is acceptable.  His last hemoglobin A1c was below 6.  Therefore I believe he is effectively diet-controlled.  I will also monitor his potassium and renal function closely.  It is  difficult to manage accurately given the fact the patient "adjusts" his medication independently.  However if his potassium and renal function is stable, I would make no changes in his dose at this time.  There is no evidence of fluid overload or pulmonary edema.  01/15/18 Unfortunately was recently admitted.  I have copied the discharge summary below for reference: Admission date: 01/01/2018 Discharge Date 01/03/2018 Primary MD [name], MD Admitting Physician [name], MD  Admission Diagnosis  Elevated troponin [R74.8] AKI (acute kidney injury) ([place]) [N17.9] Constipation, unspecified constipation type [K59.00]  Discharge Diagnosis   Active Problems: Acute on chronic kidney injury Elevated troponin due to demand ischemia Pancreatitis Recurrent Hepatitis Constipation  Hospital Course  The patient with extensive past medical history most significant for coronary artery disease status post MI and and stent placement, chronic diastolic heart failure as well as significant mitral and tricuspid regurg that is post minimally invasive repair, and chronic subdural hematoma presents to the emergency department complaining of constipation and weakness. Patient had CT abdomen which showed pancreatic cyst pancreatic cyst but no other abnormality.  Patient's abdominal pain resolved after bowel movements.  He is very anxious to go home.  Patient also was noted to have elevated troponin felt to be due to demand ischemia I recommended he follow-up with his cardiologist.   ImagingResults  Ct Abdomen  Pelvis W Wo Contrast  Result Date: 01/02/2018 CLINICAL DATA:  Unintended weight loss, abdominal pain. EXAM: CT ABDOMEN AND PELVIS WITHOUT AND WITH CONTRAST TECHNIQUE: Multidetector CT imaging of the abdomen and pelvis was performed following the standard protocol before and following the bolus administration of intravenous contrast. CONTRAST:  84mL ISOVUE-300 IOPAMIDOL (ISOVUE-300)  INJECTION 61% COMPARISON:  MR abdomen 07/04/2016 and CT abdomen pelvis 08/02/2015. FINDINGS: Lower chest: Small right pleural effusion with collapse/consolidation in the adjacent right lower lobe. Pleural calcifications on the right with calcified granulomas. Image quality is degraded by respiratory motion. Heart is moderately enlarged. Atherosclerotic calcification of the arterial vasculature, including coronary arteries and aortic bowel. Pulmonary arteries are enlarged. No pericardial effusion. Hepatobiliary: Liver margin is irregular in the liver appears shrunken. Cholecystectomy. No biliary ductal dilatation. Pancreas: Low-attenuation lesions in the tail of the pancreas measure up to 12 mm, similar to 07/04/2016. No ductal dilatation or gland atrophy. Spleen: Negative. Adrenals/Urinary Tract: Right adrenal gland is unremarkable. Thickening of the body of the left adrenal gland. Subcentimeter low-attenuation lesions in the kidneys are too small to characterize. A hyperdense lesion of the posterior interpolar left kidney measures 1.6 cm, characterized as a hemorrhagic cyst on 07/04/2016. Ureters are decompressed. Prostate indents the bladder. A diverticulum is seen off the right lateral wall of the bladder. Stomach/Bowel: Stomach, small bowel, appendix and colon are unremarkable. Vascular/Lymphatic: Atherosclerotic calcification of the arterial vasculature without abdominal aortic aneurysm. No pathologically enlarged lymph nodes. Reproductive: Prostate is enlarged slightly. Other: No free fluid.  Mesenteries and peritoneum are unremarkable. Musculoskeletal: Degenerative changes in the spine and hips. No worrisome lytic or sclerotic lesions. Old rib fractures. IMPRESSION: 1. No findings to explain the patient's symptoms. 2. Small right pleural effusion with collapse/consolidation in the adjacent right lower lobe. Follow-up to clearing is recommended to exclude malignancy. 3.  Aortic atherosclerosis (ICD10-170.0).  4. Enlarged pulmonary arteries, indicative of pulmonary arterial hypertension. 5. Cirrhosis. 6. Low-attenuation lesions in the tail the pancreas are grossly stable from 07/04/2016 and may represent pseudocysts. Cystic pancreatic neoplasm cannot be excluded. Follow-up imaging in 2 years is recommended to ensure stability. This recommendation follows ACR consensus guidelines: Management of Incidental Pancreatic Cysts: A White Paper of the ACR Incidental Findings Committee. [place] 9924;26:[id number]. 7. Enlarged prostate. Electronically Signed   By: Lorin Picket M.D.   On: 01/02/2018 14:22   Dg Abdomen Acute W/chest  Result Date: 01/01/2018 CLINICAL DATA:  Constipation, weakness, cough EXAM: DG ABDOMEN ACUTE W/ 1V CHEST COMPARISON:  11/10/2016 FINDINGS: Small right pleural effusion. Associated right lower lobe opacity, likely atelectasis. Left lung is clear. No pneumothorax. Stable right rib fractures, likely involving the right posterolateral 3rd through 7th ribs. Nonobstructive bowel gas pattern. No evidence of free air under the diaphragm on the upright view. Cholecystectomy clips. Degenerative changes of the thoracolumbar spine. IMPRESSION: Small right pleural effusion. Associated right lower lobe opacity, likely atelectasis. Stable right rib fractures.  No pneumothorax. No evidence of small bowel obstruction or free air. Electronically Signed   By: Julian Hy M.D.   On: 01/01/2018 23:48     01/15/18 I have reviewed the patient's hospital records.  Since I last saw the patient, he has lost approximately 6 pounds.  He states that he is lost 10 pounds and he is gained for back since leaving the hospital.  His wife states that he is not eating well.  She states that he would just pick at his food and eat very little.  He states  that he has trouble swallowing the food and that is why he does not eat very well.  However he states that this is been going on for years and that there has been no  sudden change.  He denies any chest pain.  He denies any shortness of breath.  He is still taking 80 mg of Lasix twice a day.  He does this the majority the time.  Occasionally he will reduce his evening dose to 40 mg a day if he feels that he is losing too much weight or if his swelling is down substantially to avoid dehydration.  This current strategy has worked well for him.  His creatinine during his hospitalization was mildly elevated at 1.6.  The patient's baseline is anywhere from 1.4-1.7.  I did review the CT scan which showed stable pancreatic cyst.  They recommended a repeat CT scan in 2 years to ensure stability.  We discussed this today with the patient he is 84 years old.  I question the utility of checking another CT scan when he is 34 given the fact he would be unable to tolerate surgery if they were cancerous.  Therefore we will discuss this in the future whether or not he wants to repeat imaging in over a year.  It also showed stable rib fractures as well as cirrhosis.  Otherwise there were no acute findings.  Patient states he feels better and is starting to get his strength back. At that time, my plan was:  Patient has no peripheral edema on exam today.  He appears to be euvolemic.  This is the lowest his weight has been since I have seen him.  Therefore I believe he is suffering from protein calorie malnutrition.  I have recommended that he add a can of Ensure every day to supplement his oral intake I will check his INR today.  Goal INR is between 2 and 2.5 for this patient.  His heart rate is well controlled.  There is no evidence of fluid overload.  Monitor a CBC as well as a CMP to monitor his potassium and renal function.  I will continue Lasix at 80 mg twice a day and have encouraged the patient to monitor his weight daily and adjust his Lasix as he has been doing if he loses weight or if he appears dehydrated.  Recheck the patient in 6 weeks or sooner if needed  05/18/18 Wt Readings  from Last 3 Encounters:  05/18/18 209 lb (94.8 kg)  05/13/18 193 lb 9.6 oz (87.8 kg)  04/27/18 200 lb (90.7 kg)   Patient has gained 9 pounds according to my scales from the last time he was here.   However at that time, the patient was unable to eat due to dysphagia.  Patient underwent GI work-up.  His dysphagia has improved.  Therefore he is eating much better.  Although gaining weight, there is no obvious edema in his legs.  He has trace bipedal edema which is at his baseline.  His lungs are clear to auscultation bilaterally and he denies any shortness of breath or dyspnea on exertion beyond his baseline.  He is overdue to check an INR today.  However he has 2 primary concerns first he is requesting a power scooter.  Patient has a difficult time walking.  Walking 25 to 30 feet causes him shortness of breath even on his best days.  He frequently has to stop and rest just to catch his breath due to his underlying  heart issues.  He also has progressive weakness in his legs.  He walks with a cane however he states that he has fallen on several occasions at home due to poor balance and weakness in his legs.  Timed stand up and go test is performed today.  It takes the patient approximately 10 seconds to stand from a chair.  This can only be accomplished by pushing on the handlebars and pulling himself up on a cabinet corner.  His house can accommodate a power scooter or power wheelchair.  However his arms are too weak to operate a manual wheelchair.  Patient recently felt a pop in his left shoulder.  He is now unable to abduct his left shoulder greater than 80 degrees without significant pain.  He has tremendous weakness with shoulder abduction raising concern for possible rotator cuff tear.  He also has pain with range of motion in his right shoulder.  Due to the pain in his shoulders with range of motion, he has a difficult time propelling himself in a wheelchair.  He believes that if he had a power scooter, he  would be able to perform ADLs around his home more easily such as going to the bathroom, bathing, grooming.  It would also facilitate his mobility outside the home and allow him freedom to go more places and a better quality of life as right now he is essentially confined to his home in his vehicle. Past Medical History:  Diagnosis Date  . Atelectasis of right lung   . Chronic diastolic (congestive) heart failure ([place])    a. 10/2013 Echo: EF 55-60%, basal-mid anteroseptal and basal-mid inferoseptal HK; b. 02/2017 Echo: EF 60-65%, Gr3 DD.  Marland Kitchen Chronic fatigue   . Chronic subdural hematoma ([place])    a. 12/2015 Head CT: chronic right holo-hemispheric SDH w/o midline shift.  . CKD (chronic kidney disease), stage III ([place])   . Coronary artery disease    a. 11/2014 NSTEMI: DESx 2 placed to LAD and RCA; b. 08/2015 Cath: LAD patent stent, LCX 68m, RCA patent stent, 74m.  . Diabetes mellitus, type II ([place])    a. HgA1c 6.8 in 03/2015  . ED (erectile dysfunction)   . Gout   . Hemangioma of liver 08/02/2015   12 mm enhancing lesion seen on CT - suspicious for benign hemangioma but not diagnostic - MRI recommended  . HLD (hyperlipidemia)   . Hypertension   . Hypertensive heart disease   . Ischemic finger ulcer ([place])    a. 03/2017 Seen for digital ulcers - felt to be embolic in setting of PAF. Pt on coumadin.  . Kidney stones   . Lower extremity edema    a. chronic  . OA (osteoarthritis)   . Orthostatic hypotension   . PAF (paroxysmal atrial fibrillation) ([place])    a. 10/2015 s/p DCCV;  b. CHA2DS2VASc = 6--> on coumadin; c. 10/2015 Echo: sev dil LA.  . Prostate cancer ([place])   . Pulmonary HTN ([place])   . Pulmonary hypertension ([place])    a. 10/2015 Echo: PASP 91mmHg.  Marland Kitchen Renal insufficiency   . Severe mitral regurgitation    a.  10/2015 s/p minimally invasive MV repair; b. 10/2015 Echo: mod MS, mean grad 52mmHg; c. 02/2017 Echo: EF 60-65%, Gr3 DD, mod Ca2+ MV annulus, mod to sev dil LA, mod dil RA, mod TR. PASP  48mmHg.  Marland Kitchen Spinal stenosis   . SVT (supraventricular tachycardia) ([facility])    a. recurrent, usually responsive to vagal manuevers  .  Tricuspid regurgitation    a. 10/2015 Echo: mild to mod TR.   Past Surgical History:  Procedure Laterality Date  . BACK SURGERY    . Back surgery x 2    . CARDIAC CATHETERIZATION N/A 08/08/2015   Procedure: Right/Left Heart Cath and Coronary Angiography;  Surgeon: Sherren Mocha, MD;  Location: [facility];  Service: Cardiovascular;  Laterality: N/A;  . CARDIAC SURGERY     2 cardiac stents  . CARDIOVERSION N/A 10/20/2015   Procedure: CARDIOVERSION;  Surgeon: Pixie Casino, MD;  Location: [facility];  Service: Cardiovascular;  Laterality: N/A;  . CERVICAL SPINE SURGERY    . CHOLECYSTECTOMY N/A 07/08/2016   Procedure: LAPAROSCOPIC CHOLECYSTECTOMY WITH INTRAOPERATIVE CHOLANGIOGRAM;  Surgeon: Georganna Skeans, MD;  Location: [facility];  Service: General;  Laterality: N/A;  . CORONARY ARTERY BYPASS GRAFT    . ESOPHAGOGASTRODUODENOSCOPY (EGD) WITH PROPOFOL N/A 03/19/2018   Procedure: ESOPHAGOGASTRODUODENOSCOPY (EGD) WITH PROPOFOL;  Surgeon: [name], MD;  Location: [facility] ENDOSCOPY;  Service: Gastroenterology;  Laterality: N/A;  . EYE SURGERY    . INTRAOPERATIVE TRANSESOPHAGEAL ECHOCARDIOGRAM  10/05/2015   Procedure: INTRAOPERATIVE TRANSESOPHAGEAL ECHOCARDIOGRAM;  Surgeon: Rexene Alberts, MD;  Location: [facility] OR;  Service: Open Heart Surgery;;  . JOINT REPLACEMENT     shoulder  . LEFT HEART CATHETERIZATION WITH CORONARY ANGIOGRAM N/A 11/16/2014   Procedure: LEFT HEART CATHETERIZATION WITH CORONARY ANGIOGRAM;  Surgeon: Troy Sine, MD;  Location: [facility] CATH LAB;  Service: Cardiovascular;  Laterality: N/A;  . MITRAL VALVE REPAIR Right 10/04/2015   Procedure: MINIMALLY INVASIVE MITRAL VALVE REPAIR (MVR);  Surgeon: Rexene Alberts, MD;  Location: [facility];  Service: Open Heart Surgery;  Laterality: Right;  . Rt rotator cuff repair    . TEE WITHOUT CARDIOVERSION N/A 06/22/2015    Procedure: TRANSESOPHAGEAL ECHOCARDIOGRAM (TEE);  Surgeon: Skeet Latch, MD;  Location: [facility];  Service: Cardiovascular;  Laterality: N/A;  . TEE WITHOUT CARDIOVERSION N/A 10/04/2015   Procedure: TRANSESOPHAGEAL ECHOCARDIOGRAM (TEE);  Surgeon: Rexene Alberts, MD;  Location: [facility];  Service: Open Heart Surgery;  Laterality: N/A;  . WOUND EXPLORATION N/A 10/05/2015   Procedure: Sternal Incision;  Surgeon: Rexene Alberts, MD;  Location: [facility];  Service: Open Heart Surgery;  Laterality: N/A;   Current Outpatient Medications on File Prior to Visit  Medication Sig Dispense Refill  . allopurinol (ZYLOPRIM) 100 MG tablet TAKE ONE (1) TABLET EACH DAY 90 tablet 2  . cyclobenzaprine (FLEXERIL) 5 MG tablet Take 1 tablet (5 mg total) by mouth 3 (three) times daily as needed for muscle spasms. 30 tablet 0  . docusate sodium (COLACE) 100 MG capsule Take 1 capsule (100 mg total) by mouth daily as needed for moderate constipation. 60 capsule 2  . finasteride (PROSCAR) 5 MG tablet Take 5 mg by mouth at bedtime.    . furosemide (LASIX) 40 MG tablet Take 2 tablets (80 mg total) by mouth 2 (two) times daily. 120 tablet 2  . loratadine (CLARITIN) 10 MG tablet Take 1 tablet (10 mg total) by mouth daily as needed for allergies. 30 tablet 3  . metoprolol tartrate (LOPRESSOR) 25 MG tablet TAKE ONE-HALF TABLET BY MOUTH TWICE DAILY 90 tablet 3  . OVER THE COUNTER MEDICATION [place]: Take 1 capsule by mouth once a day for immune health    . potassium chloride (KLOR-CON) 20 MEQ packet Take 40 mEq by mouth daily. 60 packet 0  . pravastatin (PRAVACHOL) 40 MG tablet TAKE ONE (1) TABLET EACH DAY AT 6  P.M. 30 tablet 9  . spironolactone (ALDACTONE) 25 MG tablet Take 1 tablet (25 mg total) by mouth daily. 90 tablet 3  . traMADol (ULTRAM) 50 MG tablet TAKE ONE TABLET BY MOUTH EVERY 6 HOURS AS NEEDED FOR MODERATE PAIN 30 tablet 0  . triamcinolone cream (KENALOG) 0.1 % Apply 1 application topically 2 (two) times daily.  30 g 0  . warfarin (COUMADIN) 4 MG tablet Take 1 tablet (4 mg total) by mouth daily. Do not take with any other warfarin unless Dr. Dennard Schaumann instructs. 30 tablet 3   No current facility-administered medications on file prior to visit.    No Known Allergies Social History   Socioeconomic History  . Marital status: Married    Spouse name: Not on file  . Number of children: 4  . Years of education: Not on file  . Highest education level: Not on file  Occupational History    Employer: RETIRED  Social Needs  . Financial resource strain: Not on file  . Food insecurity:    Worry: Not on file    Inability: Not on file  . Transportation needs:    Medical: Not on file    Non-medical: Not on file  Tobacco Use  . Smoking status: Former Smoker    Types: Cigarettes, Pipe, Landscape architect  . Smokeless tobacco: Former Systems developer    Types: Chew  . Tobacco comment: QUIT SMOKING  MANY YEARS AGO"  Substance and Sexual Activity  . Alcohol use: No  . Drug use: No  . Sexual activity: Not on file  Lifestyle  . Physical activity:    Days per week: Not on file    Minutes per session: Not on file  . Stress: Not on file  Relationships  . Social connections:    Talks on phone: Not on file    Gets together: Not on file    Attends religious service: Not on file    Active member of club or organization: Not on file    Attends meetings of clubs or organizations: Not on file    Relationship status: Not on file  . Intimate partner violence:    Fear of current or ex partner: Not on file    Emotionally abused: Not on file    Physically abused: Not on file    Forced sexual activity: Not on file  Other Topics Concern  . Not on file  Social History Narrative   ** Merged History Encounter **       Four adopted children.  Lives alone.       Review of Systems  All other systems reviewed and are negative.      Objective:   Physical Exam  Constitutional: He appears well-developed and well-nourished.    Cardiovascular: Normal rate.  Murmur heard. Pulmonary/Chest: Effort normal. He has no wheezes. He has no rhonchi.  Abdominal: Soft. Bowel sounds are normal. He exhibits no distension. There is no tenderness. There is no rebound.  Musculoskeletal: He exhibits no edema.  Vitals reviewed. Muscle strength in his hip flexors and hip sensors are approximately 4/5.  Knee flexors and knee extensors are 4/5.  Ankle flexors and ankle extensors are 5/5.  Flexes at the knee are normal.  Sensation in his legs is normal.  Muscle strength in his biceps is 4/5 equal and symmetric, elbow extension is 4/5, shoulder abduction is 4/5 in the right shoulder and 3/5 in the left shoulder.  Grip strength is normal.  He has poor balance and walks  with a cane.        Assessment & Plan:  Atrial fibrillation, unspecified type ([place]) - Plan: PT with INR/Fingerstick  Poor mobility  Frequent falls  Weakness of both legs  I believe it is medically appropriate and necessary for the patient have a power mobility device such as a power scooter.  Patient is only able to walk 20 to 30 feet with great difficulty and increased risk of falls due to age, deconditioning, congestive heart failure, and poor balance.  A power mobility device would facilitate his ability to perform ADLs around the home and also allow greater mobility outside the home.  His home can accommodate a power scooter.  He states that the doorways are wide enough to accommodate said device.  Therefore I have instructed the patient to contact a DME and have them fax [place] the requisite forms and I will be glad to complete them on his behalf.  Regarding his atrial fibrillation.  He is overdue to check an INR.  I reminded the patient that he needs to check this every 4 to 6 weeks.  INR today is within therapeutic limits.  I believe the patient has likely suffered a rotator cuff strain versus partial tear in his left shoulder.  I would not recommend surgery given his  age and comorbidities.  Therefore I have recommended tincture of time.  Recheck in 1 week.  If pain persist, we could try therapeutic cortisone injection for comfort.  Consider physical therapy if patient strength is not improving his left shoulder.  Regarding his weight gain, I believe this is most likely due to to increased appetite and better oral intake now that his dysphagia has improved.  Even the patient states that "I am eating now Dr."  Based on his exam I see no reason to increase his diuretic.

## 2018-05-19 ENCOUNTER — Encounter: Payer: Medicare Other | Admitting: Speech Pathology

## 2018-05-21 ENCOUNTER — Encounter: Payer: Medicare Other | Admitting: Speech Pathology

## 2018-05-26 ENCOUNTER — Encounter: Payer: Medicare Other | Admitting: Speech Pathology

## 2018-05-28 ENCOUNTER — Encounter: Payer: Medicare Other | Admitting: Speech Pathology

## 2018-06-02 ENCOUNTER — Encounter: Payer: Medicare Other | Admitting: Speech Pathology

## 2018-06-04 ENCOUNTER — Encounter: Payer: Medicare Other | Admitting: Speech Pathology

## 2018-06-09 ENCOUNTER — Encounter: Payer: Medicare Other | Admitting: Speech Pathology

## 2018-06-11 ENCOUNTER — Encounter: Payer: Medicare Other | Admitting: Speech Pathology

## 2018-07-24 ENCOUNTER — Telehealth: Payer: Self-pay | Admitting: Family Medicine

## 2018-07-24 MED ORDER — FUROSEMIDE 40 MG PO TABS: 80.0000 mg | ORAL_TABLET | Freq: Two times a day (BID) | ORAL | 2 refills | Status: DC

## 2018-07-24 NOTE — Telephone Encounter (Signed)
Patient calling to get refill on his furosemide  It will be a new pharmacy  [facility] [place]

## 2018-07-24 NOTE — Telephone Encounter (Signed)
Medication called/sent to requested pharmacy  

## 2018-08-01 ENCOUNTER — Other Ambulatory Visit: Payer: Self-pay | Admitting: Family Medicine

## 2018-08-07 ENCOUNTER — Ambulatory Visit (INDEPENDENT_AMBULATORY_CARE_PROVIDER_SITE_OTHER): Payer: Medicare Other | Admitting: Family Medicine

## 2018-08-07 ENCOUNTER — Encounter: Payer: Self-pay | Admitting: Family Medicine

## 2018-08-07 VITALS — BP 100/60 | HR 84 | Temp 97.8°F | Resp 16 | Ht 67.0 in | Wt 206.0 lb

## 2018-08-07 DIAGNOSIS — M7021 Olecranon bursitis, right elbow: Secondary | ICD-10-CM | POA: Diagnosis not present

## 2018-08-07 DIAGNOSIS — I4891 Unspecified atrial fibrillation: Secondary | ICD-10-CM | POA: Diagnosis not present

## 2018-08-07 DIAGNOSIS — R55 Syncope and collapse: Secondary | ICD-10-CM

## 2018-08-07 LAB — PT WITH INR/FINGERSTICK
INR, fingerstick: 2.8 ratio — ABNORMAL HIGH
PT FINGERSTICK: 33.6 s — AB (ref 10.5–13.1)

## 2018-08-07 NOTE — Progress Notes (Signed)
Subjective:    Patient ID: [name], male    DOB: [birth year], 85 y.o.   MRN: [id number]  HPI  01/15/18 Unfortunately was recently admitted.  I have copied the discharge summary below for reference: Admission date: 01/01/2018 Discharge Date 01/03/2018 Primary MD [name], MD Admitting Physician [name], MD  Admission Diagnosis  Elevated troponin [R74.8] AKI (acute kidney injury) ([place]) [N17.9] Constipation, unspecified constipation type [K59.00]  Discharge Diagnosis   Active Problems: Acute on chronic kidney injury Elevated troponin due to demand ischemia Pancreatitis Recurrent Hepatitis Constipation  Hospital Course  The patient with extensive past medical history most significant for coronary artery disease status post MI and and stent placement, chronic diastolic heart failure as well as significant mitral and tricuspid regurg that is post minimally invasive repair, and chronic subdural hematoma presents to the emergency department complaining of constipation and weakness. Patient had CT abdomen which showed pancreatic cyst pancreatic cyst but no other abnormality.  Patient's abdominal pain resolved after bowel movements.  He is very anxious to go home.  Patient also was noted to have elevated troponin felt to be due to demand ischemia I recommended he follow-up with his cardiologist.   ImagingResults  Ct Abdomen Pelvis W Wo Contrast  Result Date: 01/02/2018 CLINICAL DATA:  Unintended weight loss, abdominal pain. EXAM: CT ABDOMEN AND PELVIS WITHOUT AND WITH CONTRAST TECHNIQUE: Multidetector CT imaging of the abdomen and pelvis was performed following the standard protocol before and following the bolus administration of intravenous contrast. CONTRAST:  36mL ISOVUE-300 IOPAMIDOL (ISOVUE-300) INJECTION 61% COMPARISON:  MR abdomen 07/04/2016 and CT abdomen pelvis 08/02/2015. FINDINGS: Lower chest: Small right pleural effusion with collapse/consolidation in  the adjacent right lower lobe. Pleural calcifications on the right with calcified granulomas. Image quality is degraded by respiratory motion. Heart is moderately enlarged. Atherosclerotic calcification of the arterial vasculature, including coronary arteries and aortic bowel. Pulmonary arteries are enlarged. No pericardial effusion. Hepatobiliary: Liver margin is irregular in the liver appears shrunken. Cholecystectomy. No biliary ductal dilatation. Pancreas: Low-attenuation lesions in the tail of the pancreas measure up to 12 mm, similar to 07/04/2016. No ductal dilatation or gland atrophy. Spleen: Negative. Adrenals/Urinary Tract: Right adrenal gland is unremarkable. Thickening of the body of the left adrenal gland. Subcentimeter low-attenuation lesions in the kidneys are too small to characterize. A hyperdense lesion of the posterior interpolar left kidney measures 1.6 cm, characterized as a hemorrhagic cyst on 07/04/2016. Ureters are decompressed. Prostate indents the bladder. A diverticulum is seen off the right lateral wall of the bladder. Stomach/Bowel: Stomach, small bowel, appendix and colon are unremarkable. Vascular/Lymphatic: Atherosclerotic calcification of the arterial vasculature without abdominal aortic aneurysm. No pathologically enlarged lymph nodes. Reproductive: Prostate is enlarged slightly. Other: No free fluid.  Mesenteries and peritoneum are unremarkable. Musculoskeletal: Degenerative changes in the spine and hips. No worrisome lytic or sclerotic lesions. Old rib fractures. IMPRESSION: 1. No findings to explain the patient's symptoms. 2. Small right pleural effusion with collapse/consolidation in the adjacent right lower lobe. Follow-up to clearing is recommended to exclude malignancy. 3.  Aortic atherosclerosis (ICD10-170.0). 4. Enlarged pulmonary arteries, indicative of pulmonary arterial hypertension. 5. Cirrhosis. 6. Low-attenuation lesions in the tail the pancreas are grossly stable  from 07/04/2016 and may represent pseudocysts. Cystic pancreatic neoplasm cannot be excluded. Follow-up imaging in 2 years is recommended to ensure stability. This recommendation follows ACR consensus guidelines: Management of Incidental Pancreatic Cysts: A White Paper of the ACR Incidental Findings Committee. J Am Coll Radiol  [id number]. 7. Enlarged prostate. Electronically Signed   By: Lorin Picket M.D.   On: 01/02/2018 14:22   Dg Abdomen Acute W/chest  Result Date: 01/01/2018 CLINICAL DATA:  Constipation, weakness, cough EXAM: DG ABDOMEN ACUTE W/ 1V CHEST COMPARISON:  11/10/2016 FINDINGS: Small right pleural effusion. Associated right lower lobe opacity, likely atelectasis. Left lung is clear. No pneumothorax. Stable right rib fractures, likely involving the right posterolateral 3rd through 7th ribs. Nonobstructive bowel gas pattern. No evidence of free air under the diaphragm on the upright view. Cholecystectomy clips. Degenerative changes of the thoracolumbar spine. IMPRESSION: Small right pleural effusion. Associated right lower lobe opacity, likely atelectasis. Stable right rib fractures.  No pneumothorax. No evidence of small bowel obstruction or free air. Electronically Signed   By: Julian Hy M.D.   On: 01/01/2018 23:48     01/15/18 I have reviewed the patient's hospital records.  Since I last saw the patient, he has lost approximately 6 pounds.  He states that he is lost 10 pounds and he is gained for back since leaving the hospital.  His wife states that he is not eating well.  She states that he would just pick at his food and eat very little.  He states that he has trouble swallowing the food and that is why he does not eat very well.  However he states that this is been going on for years and that there has been no sudden change.  He denies any chest pain.  He denies any shortness of breath.  He is still taking 80 mg of Lasix twice a day.  He does this the majority the time.   Occasionally he will reduce his evening dose to 40 mg a day if he feels that he is losing too much weight or if his swelling is down substantially to avoid dehydration.  This current strategy has worked well for him.  His creatinine during his hospitalization was mildly elevated at 1.6.  The patient's baseline is anywhere from 1.4-1.7.  I did review the CT scan which showed stable pancreatic cyst.  They recommended a repeat CT scan in 2 years to ensure stability.  We discussed this today with the patient he is 85 years old.  I question the utility of checking another CT scan when he is 8 given the fact he would be unable to tolerate surgery if they were cancerous.  Therefore we will discuss this in the future whether or not he wants to repeat imaging in over a year.  It also showed stable rib fractures as well as cirrhosis.  Otherwise there were no acute findings.  Patient states he feels better and is starting to get his strength back. At that time, my plan was:  Patient has no peripheral edema on exam today.  He appears to be euvolemic.  This is the lowest his weight has been since I have seen him.  Therefore I believe he is suffering from protein calorie malnutrition.  I have recommended that he add a can of Ensure every day to supplement his oral intake I will check his INR today.  Goal INR is between 2 and 2.5 for this patient.  His heart rate is well controlled.  There is no evidence of fluid overload.  Monitor a CBC as well as a CMP to monitor his potassium and renal function.  I will continue Lasix at 80 mg twice a day and have encouraged the patient to monitor his weight daily and adjust his  Lasix as he has been doing if he loses weight or if he appears dehydrated.  Recheck the patient in 6 weeks or sooner if needed  05/18/18 Wt Readings from Last 3 Encounters:  05/18/18 209 lb (94.8 kg)  05/13/18 193 lb 9.6 oz (87.8 kg)  04/27/18 200 lb (90.7 kg)   Patient has gained 9 pounds according to my  scales from the last time he was here.   However at that time, the patient was unable to eat due to dysphagia.  Patient underwent GI work-up.  His dysphagia has improved.  Therefore he is eating much better.  Although gaining weight, there is no obvious edema in his legs.  He has trace bipedal edema which is at his baseline.  His lungs are clear to auscultation bilaterally and he denies any shortness of breath or dyspnea on exertion beyond his baseline.  He is overdue to check an INR today.  However he has 2 primary concerns first he is requesting a power scooter.  Patient has a difficult time walking.  Walking 25 to 30 feet causes him shortness of breath even on his best days.  He frequently has to stop and rest just to catch his breath due to his underlying heart issues.  He also has progressive weakness in his legs.  He walks with a cane however he states that he has fallen on several occasions at home due to poor balance and weakness in his legs.  Timed stand up and go test is performed today.  It takes the patient approximately 10 seconds to stand from a chair.  This can only be accomplished by pushing on the handlebars and pulling himself up on a cabinet corner.  His house can accommodate a power scooter or power wheelchair.  However his arms are too weak to operate a manual wheelchair.  Patient recently felt a pop in his left shoulder.  He is now unable to abduct his left shoulder greater than 80 degrees without significant pain.  He has tremendous weakness with shoulder abduction raising concern for possible rotator cuff tear.  He also has pain with range of motion in his right shoulder.  Due to the pain in his shoulders with range of motion, he has a difficult time propelling himself in a wheelchair.  He believes that if he had a power scooter, he would be able to perform ADLs around his home more easily such as going to the bathroom, bathing, grooming.  It would also facilitate his mobility outside the  home and allow him freedom to go more places and a better quality of life as right now he is essentially confined to his home in his vehicle.  At that time, my plan was:  I believe it is medically appropriate and necessary for the patient have a power mobility device such as a power scooter.  Patient is only able to walk 20 to 30 feet with great difficulty and increased risk of falls due to age, deconditioning, congestive heart failure, and poor balance.  A power mobility device would facilitate his ability to perform ADLs around the home and also allow greater mobility outside the home.  His home can accommodate a power scooter.  He states that the doorways are wide enough to accommodate said device.  Therefore I have instructed the patient to contact a DME and have them fax [place] the requisite forms and I will be glad to complete them on his behalf.  Regarding his atrial fibrillation.  He  is overdue to check an INR.  I reminded the patient that he needs to check this every 4 to 6 weeks.  INR today is within therapeutic limits.  I believe the patient has likely suffered a rotator cuff strain versus partial tear in his left shoulder.  I would not recommend surgery given his age and comorbidities.  Therefore I have recommended tincture of time.  Recheck in 1 week.  If pain persist, we could try therapeutic cortisone injection for comfort.  Consider physical therapy if patient strength is not improving his left shoulder.  Regarding his weight gain, I believe this is most likely due to to increased appetite and better oral intake now that his dysphagia has improved.  Even the patient states that "I am eating now Dr."  Based on his exam I see no reason to increase his diuretic.  08/07/2018 Patient has not been seen since October.  He is long overdue for a Coumadin check.  He presents today with several complaints.  Wife states that he is not eating very well.  He has very little appetite.  However his weight is only  down 3 pounds from October.  He complains of pain in his right elbow.  The olecranon bursa on the right elbow is swollen.  It is not erythematous.  It is not warm.  It appears to be bursitis.  However he also reports spells.  He states that every month he will have an episode.  Usually occurs when he is getting out of a car or walking from the car to the house where his vision will turn blue.  He will have to simply stand there and hold on his if he is about to pass out.  Symptoms last a minute or so and then will resolve spontaneously.  Sounds like near syncope versus amaurosis fugax.  However history is very difficult. Past Medical History:  Diagnosis Date  . Atelectasis of right lung   . Chronic diastolic (congestive) heart failure ([place])    a. 10/2013 Echo: EF 55-60%, basal-mid anteroseptal and basal-mid inferoseptal HK; b. 02/2017 Echo: EF 60-65%, Gr3 DD.  Marland Kitchen Chronic fatigue   . Chronic subdural hematoma ([place])    a. 12/2015 Head CT: chronic right holo-hemispheric SDH w/o midline shift.  . CKD (chronic kidney disease), stage III ([place])   . Coronary artery disease    a. 11/2014 NSTEMI: DESx 2 placed to LAD and RCA; b. 08/2015 Cath: LAD patent stent, LCX 64m, RCA patent stent, 25m.  . Diabetes mellitus, type II ([place])    a. HgA1c 6.8 in 03/2015  . ED (erectile dysfunction)   . Gout   . Hemangioma of liver 08/02/2015   12 mm enhancing lesion seen on CT - suspicious for benign hemangioma but not diagnostic - MRI recommended  . HLD (hyperlipidemia)   . Hypertension   . Hypertensive heart disease   . Ischemic finger ulcer ([place])    a. 03/2017 Seen for digital ulcers - felt to be embolic in setting of PAF. Pt on coumadin.  . Kidney stones   . Lower extremity edema    a. chronic  . OA (osteoarthritis)   . Orthostatic hypotension   . PAF (paroxysmal atrial fibrillation) ([place])    a. 10/2015 s/p DCCV;  b. CHA2DS2VASc = 6--> on coumadin; c. 10/2015 Echo: sev dil LA.  . Prostate cancer ([place])   . Pulmonary  HTN ([place])   . Pulmonary hypertension ([place])    a. 10/2015 Echo: PASP 26mmHg.  Marland Kitchen  Renal insufficiency   . Severe mitral regurgitation    a.  10/2015 s/p minimally invasive MV repair; b. 10/2015 Echo: mod MS, mean grad 47mmHg; c. 02/2017 Echo: EF 60-65%, Gr3 DD, mod Ca2+ MV annulus, mod to sev dil LA, mod dil RA, mod TR. PASP 10mmHg.  Marland Kitchen Spinal stenosis   . SVT (supraventricular tachycardia) ([facility])    a. recurrent, usually responsive to vagal manuevers  . Tricuspid regurgitation    a. 10/2015 Echo: mild to mod TR.   Past Surgical History:  Procedure Laterality Date  . BACK SURGERY    . Back surgery x 2    . CARDIAC CATHETERIZATION N/A 08/08/2015   Procedure: Right/Left Heart Cath and Coronary Angiography;  Surgeon: Sherren Mocha, MD;  Location: [facility];  Service: Cardiovascular;  Laterality: N/A;  . CARDIAC SURGERY     2 cardiac stents  . CARDIOVERSION N/A 10/20/2015   Procedure: CARDIOVERSION;  Surgeon: Pixie Casino, MD;  Location: [facility];  Service: Cardiovascular;  Laterality: N/A;  . CERVICAL SPINE SURGERY    . CHOLECYSTECTOMY N/A 07/08/2016   Procedure: LAPAROSCOPIC CHOLECYSTECTOMY WITH INTRAOPERATIVE CHOLANGIOGRAM;  Surgeon: Georganna Skeans, MD;  Location: [facility];  Service: General;  Laterality: N/A;  . CORONARY ARTERY BYPASS GRAFT    . ESOPHAGOGASTRODUODENOSCOPY (EGD) WITH PROPOFOL N/A 03/19/2018   Procedure: ESOPHAGOGASTRODUODENOSCOPY (EGD) WITH PROPOFOL;  Surgeon: [name], MD;  Location: [facility] ENDOSCOPY;  Service: Gastroenterology;  Laterality: N/A;  . EYE SURGERY    . INTRAOPERATIVE TRANSESOPHAGEAL ECHOCARDIOGRAM  10/05/2015   Procedure: INTRAOPERATIVE TRANSESOPHAGEAL ECHOCARDIOGRAM;  Surgeon: Rexene Alberts, MD;  Location: [facility] OR;  Service: Open Heart Surgery;;  . JOINT REPLACEMENT     shoulder  . LEFT HEART CATHETERIZATION WITH CORONARY ANGIOGRAM N/A 11/16/2014   Procedure: LEFT HEART CATHETERIZATION WITH CORONARY ANGIOGRAM;  Surgeon: Troy Sine, MD;  Location: [facility]  CATH LAB;  Service: Cardiovascular;  Laterality: N/A;  . MITRAL VALVE REPAIR Right 10/04/2015   Procedure: MINIMALLY INVASIVE MITRAL VALVE REPAIR (MVR);  Surgeon: Rexene Alberts, MD;  Location: [place];  Service: Open Heart Surgery;  Laterality: Right;  . Rt rotator cuff repair    . TEE WITHOUT CARDIOVERSION N/A 06/22/2015   Procedure: TRANSESOPHAGEAL ECHOCARDIOGRAM (TEE);  Surgeon: Skeet Latch, MD;  Location: [place];  Service: Cardiovascular;  Laterality: N/A;  . TEE WITHOUT CARDIOVERSION N/A 10/04/2015   Procedure: TRANSESOPHAGEAL ECHOCARDIOGRAM (TEE);  Surgeon: Rexene Alberts, MD;  Location: [place];  Service: Open Heart Surgery;  Laterality: N/A;  . WOUND EXPLORATION N/A 10/05/2015   Procedure: Sternal Incision;  Surgeon: Rexene Alberts, MD;  Location: [place];  Service: Open Heart Surgery;  Laterality: N/A;   Current Outpatient Medications on File Prior to Visit  Medication Sig Dispense Refill  . allopurinol (ZYLOPRIM) 100 MG tablet TAKE ONE (1) TABLET EACH DAY 90 tablet 2  . cyclobenzaprine (FLEXERIL) 5 MG tablet Take 1 tablet (5 mg total) by mouth 3 (three) times daily as needed for muscle spasms. 30 tablet 0  . docusate sodium (COLACE) 100 MG capsule Take 1 capsule (100 mg total) by mouth daily as needed for moderate constipation. 60 capsule 2  . finasteride (PROSCAR) 5 MG tablet Take 5 mg by mouth at bedtime.    . furosemide (LASIX) 40 MG tablet Take 2 tablets (80 mg total) by mouth 2 (two) times daily. 120 tablet 2  . loratadine (CLARITIN) 10 MG tablet Take 1 tablet (10 mg total) by mouth daily as needed for allergies.  30 tablet 3  . metoprolol tartrate (LOPRESSOR) 25 MG tablet TAKE ONE-HALF TABLET BY MOUTH TWICE DAILY 90 tablet 3  . OVER THE COUNTER MEDICATION [place]: Take 1 capsule by mouth once a day for immune health    . potassium chloride (KLOR-CON) 20 MEQ packet Take 40 mEq by mouth daily. 60 packet 0  . pravastatin (PRAVACHOL) 40 MG tablet TAKE ONE (1) TABLET EACH DAY AT  6 P.M. 30 tablet 9  . spironolactone (ALDACTONE) 25 MG tablet Take 1 tablet (25 mg total) by mouth daily. 90 tablet 3  . traMADol (ULTRAM) 50 MG tablet TAKE ONE TABLET BY MOUTH EVERY 6 HOURS AS NEEDED FOR MODERATE PAIN 30 tablet 0  . triamcinolone cream (KENALOG) 0.1 % Apply 1 application topically 2 (two) times daily. 30 g 0  . warfarin (COUMADIN) 4 MG tablet TAKE 1 TABLET(4 MG TOTAL) BY MOUTH DAILY. DO NOT TAKE WITH ANY OTHER WARFARIN UNLESS DISCARD REMAINDER. PICKARD INSTRUCTS. 30 tablet 3   No current facility-administered medications on file prior to visit.    No Known Allergies Social History   Socioeconomic History  . Marital status: Married    Spouse name: Not on file  . Number of children: 4  . Years of education: Not on file  . Highest education level: Not on file  Occupational History    Employer: RETIRED  Social Needs  . Financial resource strain: Not on file  . Food insecurity:    Worry: Not on file    Inability: Not on file  . Transportation needs:    Medical: Not on file    Non-medical: Not on file  Tobacco Use  . Smoking status: Former Smoker    Types: Cigarettes, Pipe, Landscape architect  . Smokeless tobacco: Former Systems developer    Types: Chew  . Tobacco comment: QUIT SMOKING  MANY YEARS AGO"  Substance and Sexual Activity  . Alcohol use: No  . Drug use: No  . Sexual activity: Not on file  Lifestyle  . Physical activity:    Days per week: Not on file    Minutes per session: Not on file  . Stress: Not on file  Relationships  . Social connections:    Talks on phone: Not on file    Gets together: Not on file    Attends religious service: Not on file    Active member of club or organization: Not on file    Attends meetings of clubs or organizations: Not on file    Relationship status: Not on file  . Intimate partner violence:    Fear of current or ex partner: Not on file    Emotionally abused: Not on file    Physically abused: Not on file    Forced sexual activity: Not  on file  Other Topics Concern  . Not on file  Social History Narrative   ** Merged History Encounter **       Four adopted children.  Lives alone.       Review of Systems  All other systems reviewed and are negative.      Objective:   Physical Exam  Constitutional: He appears well-developed and well-nourished.  Cardiovascular: Normal rate. An irregularly irregular rhythm present.  Murmur heard. Pulmonary/Chest: Effort normal. He has no wheezes. He has no rhonchi.  Abdominal: Soft. Bowel sounds are normal. He exhibits no distension. There is no abdominal tenderness. There is no rebound.  Musculoskeletal:        General: No edema.  Vitals  reviewed.  He has poor balance and walks with a cane.   Patient does not appear to be fluid overloaded.  There is no pitting edema in his legs.  Lungs are clear to auscultation bilaterally.     Assessment & Plan:  Atrial fibrillation, unspecified type ([place]) - Plan: PT with INR/Fingerstick  Near syncope  Olecranon bursitis of right elbow  The spells that the patient describes sound like orthostatic drops in his blood pressure causing near syncope.  However given his extensive cardiac history, would recommend cardiology consultation for possible cardiac monitor to rule out cardiac arrhythmia.  Will monitor for any electrolyte abnormalities with a CMP and a CBC.  INR is checked today and is therapeutic.  Will make no changes in his Coumadin dosage.  I recommended that we defer treatment of the olecranon bursitis for the time being.  Is no evidence of infection and it is only mildly tender.  Should gradually improve with time

## 2018-08-08 LAB — COMPLETE METABOLIC PANEL WITH GFR
AG Ratio: 1 (calc) (ref 1.0–2.5)
ALT: 16 U/L (ref 9–46)
AST: 33 U/L (ref 10–35)
Albumin: 3.4 g/dL — ABNORMAL LOW (ref 3.6–5.1)
Alkaline phosphatase (APISO): 138 U/L — ABNORMAL HIGH (ref 40–115)
BILIRUBIN TOTAL: 1.1 mg/dL (ref 0.2–1.2)
BUN / CREAT RATIO: 23 (calc) — AB (ref 6–22)
BUN: 35 mg/dL — ABNORMAL HIGH (ref 7–25)
CALCIUM: 9.2 mg/dL (ref 8.6–10.3)
CO2: 27 mmol/L (ref 20–32)
CREATININE: 1.52 mg/dL — AB (ref 0.70–1.11)
Chloride: 99 mmol/L (ref 98–110)
GFR, Est African American: 47 mL/min/{1.73_m2} — ABNORMAL LOW (ref >=60)
GFR, Est Non African American: 41 mL/min/{1.73_m2} — ABNORMAL LOW (ref >=60)
Globulin: 3.4 g/dL (calc) (ref 1.9–3.7)
Glucose, Bld: 127 mg/dL — ABNORMAL HIGH (ref 65–99)
POTASSIUM: 4.2 mmol/L (ref 3.5–5.3)
Sodium: 137 mmol/L (ref 135–146)
Total Protein: 6.8 g/dL (ref 6.1–8.1)

## 2018-08-08 LAB — CBC WITH DIFFERENTIAL/PLATELET
Absolute Monocytes: 448 cells/uL (ref 200–950)
BASOS ABS: 51 {cells}/uL (ref 0–200)
Basophils Relative: 0.8 %
Eosinophils Absolute: 237 cells/uL (ref 15–500)
Eosinophils Relative: 3.7 %
HEMATOCRIT: 42.3 % (ref 38.5–50.0)
HEMOGLOBIN: 14.2 g/dL (ref 13.2–17.1)
Lymphs Abs: 1843 cells/uL (ref 850–3900)
MCH: 32.1 pg (ref 27.0–33.0)
MCHC: 33.6 g/dL (ref 32.0–36.0)
MCV: 95.5 fL (ref 80.0–100.0)
MONOS PCT: 7 %
MPV: 11 fL (ref 7.5–12.5)
NEUTROS PCT: 59.7 %
Neutro Abs: 3821 cells/uL (ref 1500–7800)
Platelets: 128 10*3/uL — ABNORMAL LOW (ref 140–400)
RBC: 4.43 10*6/uL (ref 4.20–5.80)
RDW: 13 % (ref 11.0–15.0)
TOTAL LYMPHOCYTE: 28.8 %
WBC: 6.4 10*3/uL (ref 3.8–10.8)

## 2018-08-13 NOTE — Progress Notes (Signed)
HPI  The patient presents for evaluation of HeFPEF, CAD, narrow complex tachycardia and MR.  He is status post MV repair in 10/2015.  His immediate post op course was complicated by hypotension inducible SAM and MR.  This improved with medication adjustment including adding a beta blocker.  Post op he had atrial fib (requiring DCCV and amiodarone), acute on chronic diastolic HF, syncope with orthostasis.  We saw him in follow up because of ulcers that have developed on his fingertips.  These were managed conservatively and thought to possibly be small vessel spasm.  He did have some increased lower extremity edema.    Since I last saw him he is done relatively well.  He does get dizzy sometimes when he stands up.  He has to give himself a few seconds to recover.  He denies any presyncope or syncope however.  He might notice his heart palpitating occasionally but would not of known he was in atrial fibrillation and he is today.  He gets around slowly with a cane.  He denies any chest pressure, neck or arm discomfort.  He has had no PND or orthopnea.  He said no weight gain or edema.   No Known Allergies  Current Outpatient Medications  Medication Sig Dispense Refill  . allopurinol (ZYLOPRIM) 100 MG tablet TAKE ONE (1) TABLET EACH DAY 90 tablet 2  . cyclobenzaprine (FLEXERIL) 5 MG tablet Take 1 tablet (5 mg total) by mouth 3 (three) times daily as needed for muscle spasms. 30 tablet 0  . docusate sodium (COLACE) 100 MG capsule Take 1 capsule (100 mg total) by mouth daily as needed for moderate constipation. 60 capsule 2  . finasteride (PROSCAR) 5 MG tablet Take 5 mg by mouth at bedtime.    . furosemide (LASIX) 40 MG tablet Take 2 tablets (80 mg total) by mouth 2 (two) times daily. 120 tablet 2  . loratadine (CLARITIN) 10 MG tablet Take 1 tablet (10 mg total) by mouth daily as needed for allergies. 30 tablet 3  . metoprolol tartrate (LOPRESSOR) 25 MG tablet TAKE ONE-HALF TABLET BY MOUTH TWICE  DAILY 90 tablet 3  . OVER THE COUNTER MEDICATION [place]: Take 1 capsule by mouth once a day for immune health    . potassium chloride (KLOR-CON) 20 MEQ packet Take 40 mEq by mouth daily. 60 packet 0  . pravastatin (PRAVACHOL) 40 MG tablet TAKE ONE (1) TABLET EACH DAY AT 6 P.M. 30 tablet 9  . spironolactone (ALDACTONE) 25 MG tablet Take 1 tablet (25 mg total) by mouth daily. 90 tablet 3  . traMADol (ULTRAM) 50 MG tablet TAKE ONE TABLET BY MOUTH EVERY 6 HOURS AS NEEDED FOR MODERATE PAIN 30 tablet 0  . triamcinolone cream (KENALOG) 0.1 % Apply 1 application topically 2 (two) times daily. 30 g 0  . warfarin (COUMADIN) 4 MG tablet TAKE 1 TABLET(4 MG TOTAL) BY MOUTH DAILY. DO NOT TAKE WITH ANY OTHER WARFARIN UNLESS DISCARD REMAINDER. PICKARD INSTRUCTS. 30 tablet 3   No current facility-administered medications for this visit.     Past Medical History:  Diagnosis Date  . Atelectasis of right lung   . Chronic diastolic (congestive) heart failure ([place])    a. 10/2013 Echo: EF 55-60%, basal-mid anteroseptal and basal-mid inferoseptal HK; b. 02/2017 Echo: EF 60-65%, Gr3 DD.  Marland Kitchen Chronic fatigue   . Chronic subdural hematoma ([place])    a. 12/2015 Head CT: chronic right holo-hemispheric SDH w/o midline shift.  . CKD (chronic kidney disease),  stage III ([place])   . Coronary artery disease    a. 11/2014 NSTEMI: DESx 2 placed to LAD and RCA; b. 08/2015 Cath: LAD patent stent, LCX 80m, RCA patent stent, 77m.  . Diabetes mellitus, type II ([place])    a. HgA1c 6.8 in 03/2015  . ED (erectile dysfunction)   . Gout   . Hemangioma of liver 08/02/2015   12 mm enhancing lesion seen on CT - suspicious for benign hemangioma but not diagnostic - MRI recommended  . HLD (hyperlipidemia)   . Hypertension   . Hypertensive heart disease   . Ischemic finger ulcer ([place])    a. 03/2017 Seen for digital ulcers - felt to be embolic in setting of PAF. Pt on coumadin.  . Kidney stones   . Lower extremity edema    a. chronic  . OA  (osteoarthritis)   . Orthostatic hypotension   . PAF (paroxysmal atrial fibrillation) ([place])    a. 10/2015 s/p DCCV;  b. CHA2DS2VASc = 6--> on coumadin; c. 10/2015 Echo: sev dil LA.  . Prostate cancer ([place])   . Pulmonary HTN ([place])   . Pulmonary hypertension ([place])    a. 10/2015 Echo: PASP 8mmHg.  Marland Kitchen Renal insufficiency   . Severe mitral regurgitation    a.  10/2015 s/p minimally invasive MV repair; b. 10/2015 Echo: mod MS, mean grad 77mmHg; c. 02/2017 Echo: EF 60-65%, Gr3 DD, mod Ca2+ MV annulus, mod to sev dil LA, mod dil RA, mod TR. PASP 20mmHg.  Marland Kitchen Spinal stenosis   . SVT (supraventricular tachycardia) ([facility])    a. recurrent, usually responsive to vagal manuevers  . Tricuspid regurgitation    a. 10/2015 Echo: mild to mod TR.    Past Surgical History:  Procedure Laterality Date  . BACK SURGERY    . Back surgery x 2    . CARDIAC CATHETERIZATION N/A 08/08/2015   Procedure: Right/Left Heart Cath and Coronary Angiography;  Surgeon: Sherren Mocha, MD;  Location: [facility];  Service: Cardiovascular;  Laterality: N/A;  . CARDIAC SURGERY     2 cardiac stents  . CARDIOVERSION N/A 10/20/2015   Procedure: CARDIOVERSION;  Surgeon: Pixie Casino, MD;  Location: [facility];  Service: Cardiovascular;  Laterality: N/A;  . CERVICAL SPINE SURGERY    . CHOLECYSTECTOMY N/A 07/08/2016   Procedure: LAPAROSCOPIC CHOLECYSTECTOMY WITH INTRAOPERATIVE CHOLANGIOGRAM;  Surgeon: Georganna Skeans, MD;  Location: [place];  Service: General;  Laterality: N/A;  . CORONARY ARTERY BYPASS GRAFT    . ESOPHAGOGASTRODUODENOSCOPY (EGD) WITH PROPOFOL N/A 03/19/2018   Procedure: ESOPHAGOGASTRODUODENOSCOPY (EGD) WITH PROPOFOL;  Surgeon: [name], MD;  Location[facility] ENDOSCOPY;  Service: Gastroenterology;  Laterality: N/A;  . EYE SURGERY    . INTRAOPERATIVE TRANSESOPHAGEAL ECHOCARDIOGRAM  10/05/2015   Procedure: INTRAOPERATIVE TRANSESOPHAGEAL ECHOCARDIOGRAM;  Surgeon: Rexene Alberts, MD;  Location: [facility] OR;  Service: Open Heart  Surgery;;  . JOINT REPLACEMENT     shoulder  . LEFT HEART CATHETERIZATION WITH CORONARY ANGIOGRAM N/A 11/16/2014   Procedure: LEFT HEART CATHETERIZATION WITH CORONARY ANGIOGRAM;  Surgeon: Troy Sine, MD;  Location: [facility] CATH LAB;  Service: Cardiovascular;  Laterality: N/A;  . MITRAL VALVE REPAIR Right 10/04/2015   Procedure: MINIMALLY INVASIVE MITRAL VALVE REPAIR (MVR);  Surgeon: Rexene Alberts, MD;  Location: [place];  Service: Open Heart Surgery;  Laterality: Right;  . Rt rotator cuff repair    . TEE WITHOUT CARDIOVERSION N/A 06/22/2015   Procedure: TRANSESOPHAGEAL ECHOCARDIOGRAM (TEE);  Surgeon: Skeet Latch, MD;  Location: [facility]  ENDOSCOPY;  Service: Cardiovascular;  Laterality: N/A;  . TEE WITHOUT CARDIOVERSION N/A 10/04/2015   Procedure: TRANSESOPHAGEAL ECHOCARDIOGRAM (TEE);  Surgeon: Rexene Alberts, MD;  Location: [place];  Service: Open Heart Surgery;  Laterality: N/A;  . WOUND EXPLORATION N/A 10/05/2015   Procedure: Sternal Incision;  Surgeon: Rexene Alberts, MD;  Location: [place];  Service: Open Heart Surgery;  Laterality: N/A;     ROS:  As stated in the HPI and negative for all other systems.     PHYSICAL EXAM BP 124/80   Pulse 81   Ht 5\' 10"  (1.778 m)   Wt 208 lb (94.3 kg)   SpO2 99%   BMI 29.84 kg/m   GENERAL:, Frail appearing NECK:  No jugular venous distention, waveform within normal limits, carotid upstroke brisk and symmetric, no bruits, no thyromegaly LUNGS:  Clear to auscultation bilaterally CHEST:  Unremarkable HEART:  PMI not displaced or sustained,S1 and S2 within normal limits, no S3, no clicks, no rubs, 2 out of 6 apical systolic murmur radiating slightly at the aortic outflow tract, no diastolic murmurs, irregular ABD:  Flat, positive bowel sounds normal in frequency in pitch, no bruits, no rebound, no guarding, no midline pulsatile mass, no hepatomegaly, no splenomegaly EXT:  2 plus pulses throughout,, mild right greater than left leg edema, no cyanosis no  clubbing   EKG:   Atrial fibrillation, rate 89, poor anterior R wave progression, old inferior infarct, QT prolonged, left axis deviation, left anterior fascicular block, no acute ST-T wave changes, chronic lateral T wave inversions  Lab Results  Component Value Date   CHOL 158 06/05/2015   TRIG 235 (H) 06/05/2015   HDL 31 (L) 06/05/2015   LDLCALC 80 06/05/2015   Lab Results  Component Value Date   HGBA1C 6.3 (H) 02/24/2018   Lab Results  Component Value Date   CREATININE 1.52 (H) 08/07/2018    ASSESSMENT AND PLAN   Mitral valve repair:   No change in therapy.  Continue with conservative management.   Atrial fib:      Mr. KALEV TEMME has a CHA2DS2 - VASc score of 5 with a risk of stroke of 6.7%.   I suspect he is probably in persistent or permanent atrial fibrillation now.  Do not think there is any advantage to try to cardiovert this.  He tolerates anticoagulation.  I will apply a 24-hour Holter to make sure he has good rate control.  SVT-    he has had no symptomatic recurrence of this.  No change in therapy.  HFpEF/severe LVH/mod MR/pHTN (RVSP 71mmHg) -      He seems to be euvolemic.  Volume is addressed as below.   CAD-     The patient has no new sypmtoms.  No further cardiovascular testing is indicated.  We will continue with aggressive risk reduction and meds as listed.  DM-    Hemoglobin A1c was 6.3.  I defer follow up to [name], MD    CKD II:  Creat was 1.52.  Continue current therapy.   DIZZINESS: He probably has some orthostatic symptoms.  We talked about this.  However, I think this can be followed conservatively.  I gave him some information on some compression stockings.  He has some on today but they are probably too loose.  He did have some mild orthostatic drop and was transient.  I think he just needs to be careful and no change in therapy is indicated.  I think  he can continue the current dose of diuretic.

## 2018-08-14 ENCOUNTER — Ambulatory Visit: Payer: Medicare Other | Admitting: Cardiology

## 2018-08-14 ENCOUNTER — Encounter: Payer: Self-pay | Admitting: Cardiology

## 2018-08-14 VITALS — BP 124/80 | HR 81 | Ht 70.0 in | Wt 208.0 lb

## 2018-08-14 DIAGNOSIS — I4819 Other persistent atrial fibrillation: Secondary | ICD-10-CM | POA: Diagnosis not present

## 2018-08-14 DIAGNOSIS — I471 Supraventricular tachycardia, unspecified: Secondary | ICD-10-CM | POA: Insufficient documentation

## 2018-08-14 DIAGNOSIS — I2510 Atherosclerotic heart disease of native coronary artery without angina pectoris: Secondary | ICD-10-CM | POA: Diagnosis not present

## 2018-08-14 DIAGNOSIS — N182 Chronic kidney disease, stage 2 (mild): Secondary | ICD-10-CM | POA: Diagnosis not present

## 2018-08-14 DIAGNOSIS — R42 Dizziness and giddiness: Secondary | ICD-10-CM | POA: Diagnosis not present

## 2018-08-14 NOTE — Patient Instructions (Signed)
Medication Instructions:  Continue current medications  If you need a refill on your cardiac medications before your next appointment, please call your pharmacy.  Labwork: None Ordered   Take the provided lab slips with you to the lab for your blood draw.  When you have your labs (blood work) drawn today and your tests are completely normal, you will receive your results only by MyChart Message (if you have MyChart) -OR-  A paper copy in the mail.  If you have any lab test that is abnormal or we need to change your treatment, we will call you to review these results.  Testing/Procedures: Your physician has recommended that you wear a holter monitor 24 hours. Holter monitors are medical devices that record the heart's electrical activity. Doctors most often use these monitors to diagnose arrhythmias. Arrhythmias are problems with the speed or rhythm of the heartbeat. The monitor is a small, portable device. You can wear one while you do your normal daily activities. This is usually used to diagnose what is causing palpitations/syncope (passing out).   Follow-Up: . You will need a follow up appointment in 6 months with [place], you and your health needs are our priority.  As part of our continuing mission to provide you with exceptional heart care, we have created designated Provider Care Teams.  These Care Teams include your primary Cardiologist (physician) and Advanced Practice Providers (APPs -  Physician Assistants and Nurse Practitioners) who all work together to provide you with the care you need, when you need it.  Thank you for choosing [facility] at [facility]!!

## 2018-08-18 NOTE — Addendum Note (Signed)
Addended by: Alvina Filbert B on: 08/18/2018 12:21 PM   Modules accepted: Orders

## 2018-08-26 ENCOUNTER — Ambulatory Visit (INDEPENDENT_AMBULATORY_CARE_PROVIDER_SITE_OTHER): Payer: Medicare Other

## 2018-08-26 ENCOUNTER — Other Ambulatory Visit: Payer: Self-pay | Admitting: Cardiology

## 2018-08-26 DIAGNOSIS — I4819 Other persistent atrial fibrillation: Secondary | ICD-10-CM

## 2018-08-26 DIAGNOSIS — I4891 Unspecified atrial fibrillation: Secondary | ICD-10-CM

## 2018-09-06 ENCOUNTER — Other Ambulatory Visit: Payer: Self-pay | Admitting: Family Medicine

## 2018-09-10 ENCOUNTER — Ambulatory Visit (INDEPENDENT_AMBULATORY_CARE_PROVIDER_SITE_OTHER): Payer: Medicare Other | Admitting: Family Medicine

## 2018-09-10 ENCOUNTER — Encounter: Payer: Self-pay | Admitting: Family Medicine

## 2018-09-10 VITALS — BP 88/60 | HR 90 | Temp 97.6°F | Resp 14 | Ht 67.0 in | Wt 208.0 lb

## 2018-09-10 DIAGNOSIS — I5023 Acute on chronic systolic (congestive) heart failure: Secondary | ICD-10-CM

## 2018-09-10 NOTE — Progress Notes (Signed)
Subjective:    Patient ID: [name], male    DOB: [birth year], 85 y.o.   MRN: [id number]  HPI  01/15/18 Unfortunately was recently admitted.  I have copied the discharge summary below for reference: Admission date: 01/01/2018 Discharge Date 01/03/2018 Primary MD [name], MD Admitting Physician [name], MD  Admission Diagnosis  Elevated troponin [R74.8] AKI (acute kidney injury) ([place]) [N17.9] Constipation, unspecified constipation type [K59.00]  Discharge Diagnosis   Active Problems: Acute on chronic kidney injury Elevated troponin due to demand ischemia Pancreatitis Recurrent Hepatitis Constipation  Hospital Course  The patient with extensive past medical history most significant for coronary artery disease status post MI and and stent placement, chronic diastolic heart failure as well as significant mitral and tricuspid regurg that is post minimally invasive repair, and chronic subdural hematoma presents to the emergency department complaining of constipation and weakness. Patient had CT abdomen which showed pancreatic cyst pancreatic cyst but no other abnormality.  Patient's abdominal pain resolved after bowel movements.  He is very anxious to go home.  Patient also was noted to have elevated troponin felt to be due to demand ischemia I recommended he follow-up with his cardiologist.   ImagingResults  Ct Abdomen Pelvis W Wo Contrast  Result Date: 01/02/2018 CLINICAL DATA:  Unintended weight loss, abdominal pain. EXAM: CT ABDOMEN AND PELVIS WITHOUT AND WITH CONTRAST TECHNIQUE: Multidetector CT imaging of the abdomen and pelvis was performed following the standard protocol before and following the bolus administration of intravenous contrast. CONTRAST:  59mL ISOVUE-300 IOPAMIDOL (ISOVUE-300) INJECTION 61% COMPARISON:  MR abdomen 07/04/2016 and CT abdomen pelvis 08/02/2015. FINDINGS: Lower chest: Small right pleural effusion with collapse/consolidation in  the adjacent right lower lobe. Pleural calcifications on the right with calcified granulomas. Image quality is degraded by respiratory motion. Heart is moderately enlarged. Atherosclerotic calcification of the arterial vasculature, including coronary arteries and aortic bowel. Pulmonary arteries are enlarged. No pericardial effusion. Hepatobiliary: Liver margin is irregular in the liver appears shrunken. Cholecystectomy. No biliary ductal dilatation. Pancreas: Low-attenuation lesions in the tail of the pancreas measure up to 12 mm, similar to 07/04/2016. No ductal dilatation or gland atrophy. Spleen: Negative. Adrenals/Urinary Tract: Right adrenal gland is unremarkable. Thickening of the body of the left adrenal gland. Subcentimeter low-attenuation lesions in the kidneys are too small to characterize. A hyperdense lesion of the posterior interpolar left kidney measures 1.6 cm, characterized as a hemorrhagic cyst on 07/04/2016. Ureters are decompressed. Prostate indents the bladder. A diverticulum is seen off the right lateral wall of the bladder. Stomach/Bowel: Stomach, small bowel, appendix and colon are unremarkable. Vascular/Lymphatic: Atherosclerotic calcification of the arterial vasculature without abdominal aortic aneurysm. No pathologically enlarged lymph nodes. Reproductive: Prostate is enlarged slightly. Other: No free fluid.  Mesenteries and peritoneum are unremarkable. Musculoskeletal: Degenerative changes in the spine and hips. No worrisome lytic or sclerotic lesions. Old rib fractures. IMPRESSION: 1. No findings to explain the patient's symptoms. 2. Small right pleural effusion with collapse/consolidation in the adjacent right lower lobe. Follow-up to clearing is recommended to exclude malignancy. 3.  Aortic atherosclerosis (ICD10-170.0). 4. Enlarged pulmonary arteries, indicative of pulmonary arterial hypertension. 5. Cirrhosis. 6. Low-attenuation lesions in the tail the pancreas are grossly stable  from 07/04/2016 and may represent pseudocysts. Cystic pancreatic neoplasm cannot be excluded. Follow-up imaging in 2 years is recommended to ensure stability. This recommendation follows ACR consensus guidelines: Management of Incidental Pancreatic Cysts: A White Paper of the ACR Incidental Findings Committee. J Am Coll Radiol  0263;[id number]. 7. Enlarged prostate. Electronically Signed   By: Lorin Picket M.D.   On: 01/02/2018 14:22   Dg Abdomen Acute W/chest  Result Date: 01/01/2018 CLINICAL DATA:  Constipation, weakness, cough EXAM: DG ABDOMEN ACUTE W/ 1V CHEST COMPARISON:  11/10/2016 FINDINGS: Small right pleural effusion. Associated right lower lobe opacity, likely atelectasis. Left lung is clear. No pneumothorax. Stable right rib fractures, likely involving the right posterolateral 3rd through 7th ribs. Nonobstructive bowel gas pattern. No evidence of free air under the diaphragm on the upright view. Cholecystectomy clips. Degenerative changes of the thoracolumbar spine. IMPRESSION: Small right pleural effusion. Associated right lower lobe opacity, likely atelectasis. Stable right rib fractures.  No pneumothorax. No evidence of small bowel obstruction or free air. Electronically Signed   By: Julian Hy M.D.   On: 01/01/2018 23:48     01/15/18 I have reviewed the patient's hospital records.  Since I last saw the patient, he has lost approximately 6 pounds.  He states that he is lost 10 pounds and he is gained for back since leaving the hospital.  His wife states that he is not eating well.  She states that he would just pick at his food and eat very little.  He states that he has trouble swallowing the food and that is why he does not eat very well.  However he states that this is been going on for years and that there has been no sudden change.  He denies any chest pain.  He denies any shortness of breath.  He is still taking 80 mg of Lasix twice a day.  He does this the majority the time.   Occasionally he will reduce his evening dose to 40 mg a day if he feels that he is losing too much weight or if his swelling is down substantially to avoid dehydration.  This current strategy has worked well for him.  His creatinine during his hospitalization was mildly elevated at 1.6.  The patient's baseline is anywhere from 1.4-1.7.  I did review the CT scan which showed stable pancreatic cyst.  They recommended a repeat CT scan in 2 years to ensure stability.  We discussed this today with the patient he is 85 years old.  I question the utility of checking another CT scan when he is 52 given the fact he would be unable to tolerate surgery if they were cancerous.  Therefore we will discuss this in the future whether or not he wants to repeat imaging in over a year.  It also showed stable rib fractures as well as cirrhosis.  Otherwise there were no acute findings.  Patient states he feels better and is starting to get his strength back. At that time, my plan was:  Patient has no peripheral edema on exam today.  He appears to be euvolemic.  This is the lowest his weight has been since I have seen him.  Therefore I believe he is suffering from protein calorie malnutrition.  I have recommended that he add a can of Ensure every day to supplement his oral intake I will check his INR today.  Goal INR is between 2 and 2.5 for this patient.  His heart rate is well controlled.  There is no evidence of fluid overload.  Monitor a CBC as well as a CMP to monitor his potassium and renal function.  I will continue Lasix at 80 mg twice a day and have encouraged the patient to monitor his weight daily and adjust his  Lasix as he has been doing if he loses weight or if he appears dehydrated.  Recheck the patient in 6 weeks or sooner if needed  05/18/18 Wt Readings from Last 3 Encounters:  09/10/18 208 lb (94.3 kg)  08/14/18 208 lb (94.3 kg)  08/07/18 206 lb (93.4 kg)   Patient has gained 9 pounds according to my scales  from the last time he was here.   However at that time, the patient was unable to eat due to dysphagia.  Patient underwent GI work-up.  His dysphagia has improved.  Therefore he is eating much better.  Although gaining weight, there is no obvious edema in his legs.  He has trace bipedal edema which is at his baseline.  His lungs are clear to auscultation bilaterally and he denies any shortness of breath or dyspnea on exertion beyond his baseline.  He is overdue to check an INR today.  However he has 2 primary concerns first he is requesting a power scooter.  Patient has a difficult time walking.  Walking 25 to 30 feet causes him shortness of breath even on his best days.  He frequently has to stop and rest just to catch his breath due to his underlying heart issues.  He also has progressive weakness in his legs.  He walks with a cane however he states that he has fallen on several occasions at home due to poor balance and weakness in his legs.  Timed stand up and go test is performed today.  It takes the patient approximately 10 seconds to stand from a chair.  This can only be accomplished by pushing on the handlebars and pulling himself up on a cabinet corner.  His house can accommodate a power scooter or power wheelchair.  However his arms are too weak to operate a manual wheelchair.  Patient recently felt a pop in his left shoulder.  He is now unable to abduct his left shoulder greater than 80 degrees without significant pain.  He has tremendous weakness with shoulder abduction raising concern for possible rotator cuff tear.  He also has pain with range of motion in his right shoulder.  Due to the pain in his shoulders with range of motion, he has a difficult time propelling himself in a wheelchair.  He believes that if he had a power scooter, he would be able to perform ADLs around his home more easily such as going to the bathroom, bathing, grooming.  It would also facilitate his mobility outside the home and  allow him freedom to go more places and a better quality of life as right now he is essentially confined to his home in his vehicle.  At that time, my plan was:  I believe it is medically appropriate and necessary for the patient have a power mobility device such as a power scooter.  Patient is only able to walk 20 to 30 feet with great difficulty and increased risk of falls due to age, deconditioning, congestive heart failure, and poor balance.  A power mobility device would facilitate his ability to perform ADLs around the home and also allow greater mobility outside the home.  His home can accommodate a power scooter.  He states that the doorways are wide enough to accommodate said device.  Therefore I have instructed the patient to contact a DME and have them fax [place] the requisite forms and I will be glad to complete them on his behalf.  Regarding his atrial fibrillation.  He is overdue  to check an INR.  I reminded the patient that he needs to check this every 4 to 6 weeks.  INR today is within therapeutic limits.  I believe the patient has likely suffered a rotator cuff strain versus partial tear in his left shoulder.  I would not recommend surgery given his age and comorbidities.  Therefore I have recommended tincture of time.  Recheck in 1 week.  If pain persist, we could try therapeutic cortisone injection for comfort.  Consider physical therapy if patient strength is not improving his left shoulder.  Regarding his weight gain, I believe this is most likely due to to increased appetite and better oral intake now that his dysphagia has improved.  Even the patient states that "I am eating now Dr."  Based on his exam I see no reason to increase his diuretic.  08/07/2018 Patient has not been seen since October.  He is long overdue for a Coumadin check.  He presents today with several complaints.  Wife states that he is not eating very well.  He has very little appetite.  However his weight is only down 3  pounds from October.  He complains of pain in his right elbow.  The olecranon bursa on the right elbow is swollen.  It is not erythematous.  It is not warm.  It appears to be bursitis.  However he also reports spells.  He states that every month he will have an episode.  Usually occurs when he is getting out of a car or walking from the car to the house where his vision will turn blue.  He will have to simply stand there and hold on his if he is about to pass out.  Symptoms last a minute or so and then will resolve spontaneously.  Sounds like near syncope versus amaurosis fugax.  However history is very difficult.  At that time, my plan was: The spells that the patient describes sound like orthostatic drops in his blood pressure causing near syncope.  However given his extensive cardiac history, would recommend cardiology consultation for possible cardiac monitor to rule out cardiac arrhythmia.  Will monitor for any electrolyte abnormalities with a CMP and a CBC.  INR is checked today and is therapeutic.  Will make no changes in his Coumadin dosage.  I recommended that we defer treatment of the olecranon bursitis for the time being.  Is no evidence of infection and it is only mildly tender.  Should gradually improve with time  09/10/18 I reviewed the patient's recent visit with his cardiologist.  Since that time, his legs have started to swell dramatically.  He has +2 pitting edema in his right leg up to his knee.  He is unable to wear his compression hose due to the swelling.  He also has +2 pitting edema in his left leg up to his knee.  His legs are so swollen that they hurt.  He also reports dyspnea with exertion which is mild.  He denies any chest pain.  He denies any syncope.  He denies any lightheadedness.  His blood pressure today however is extremely soft at 88/60.  I confirmed this personally.  However he has bibasilar crackles in both lungs dusting fluid overload despite his weight essentially remaining  stable Past Medical History:  Diagnosis Date  . Atelectasis of right lung   . Chronic diastolic (congestive) heart failure ([place])    a. 10/2013 Echo: EF 55-60%, basal-mid anteroseptal and basal-mid inferoseptal HK; b. 02/2017 Echo: EF  60-65%, Gr3 DD.  Marland Kitchen Chronic fatigue   . Chronic subdural hematoma ([place])    a. 12/2015 Head CT: chronic right holo-hemispheric SDH w/o midline shift.  . CKD (chronic kidney disease), stage III ([place])   . Coronary artery disease    a. 11/2014 NSTEMI: DESx 2 placed to LAD and RCA; b. 08/2015 Cath: LAD patent stent, LCX 22m, RCA patent stent, 41m.  . Diabetes mellitus, type II ([place])    a. HgA1c 6.8 in 03/2015  . ED (erectile dysfunction)   . Gout   . Hemangioma of liver 08/02/2015   12 mm enhancing lesion seen on CT - suspicious for benign hemangioma but not diagnostic - MRI recommended  . HLD (hyperlipidemia)   . Hypertension   . Hypertensive heart disease   . Ischemic finger ulcer ([place])    a. 03/2017 Seen for digital ulcers - felt to be embolic in setting of PAF. Pt on coumadin.  . Kidney stones   . Lower extremity edema    a. chronic  . OA (osteoarthritis)   . Orthostatic hypotension   . PAF (paroxysmal atrial fibrillation) ([place])    a. 10/2015 s/p DCCV;  b. CHA2DS2VASc = 6--> on coumadin; c. 10/2015 Echo: sev dil LA.  . Prostate cancer ([place])   . Pulmonary HTN ([place])   . Pulmonary hypertension ([place])    a. 10/2015 Echo: PASP 42mmHg.  Marland Kitchen Renal insufficiency   . Severe mitral regurgitation    a.  10/2015 s/p minimally invasive MV repair; b. 10/2015 Echo: mod MS, mean grad 7mmHg; c. 02/2017 Echo: EF 60-65%, Gr3 DD, mod Ca2+ MV annulus, mod to sev dil LA, mod dil RA, mod TR. PASP 78mmHg.  Marland Kitchen Spinal stenosis   . SVT (supraventricular tachycardia) ([facility])    a. recurrent, usually responsive to vagal manuevers  . Tricuspid regurgitation    a. 10/2015 Echo: mild to mod TR.   Past Surgical History:  Procedure Laterality Date  . BACK SURGERY    . Back surgery x 2    .  CARDIAC CATHETERIZATION N/A 08/08/2015   Procedure: Right/Left Heart Cath and Coronary Angiography;  Surgeon: Sherren Mocha, MD;  Location: [facility];  Service: Cardiovascular;  Laterality: N/A;  . CARDIAC SURGERY     2 cardiac stents  . CARDIOVERSION N/A 10/20/2015   Procedure: CARDIOVERSION;  Surgeon: Pixie Casino, MD;  Location: [facility];  Service: Cardiovascular;  Laterality: N/A;  . CERVICAL SPINE SURGERY    . CHOLECYSTECTOMY N/A 07/08/2016   Procedure: LAPAROSCOPIC CHOLECYSTECTOMY WITH INTRAOPERATIVE CHOLANGIOGRAM;  Surgeon: Georganna Skeans, MD;  Location: [facility];  Service: General;  Laterality: N/A;  . CORONARY ARTERY BYPASS GRAFT    . ESOPHAGOGASTRODUODENOSCOPY (EGD) WITH PROPOFOL N/A 03/19/2018   Procedure: ESOPHAGOGASTRODUODENOSCOPY (EGD) WITH PROPOFOL;  Surgeon: [name], MD;  Location[facility] ENDOSCOPY;  Service: Gastroenterology;  Laterality: N/A;  . EYE SURGERY    . INTRAOPERATIVE TRANSESOPHAGEAL ECHOCARDIOGRAM  10/05/2015   Procedure: INTRAOPERATIVE TRANSESOPHAGEAL ECHOCARDIOGRAM;  Surgeon: Rexene Alberts, MD;  Location: [facility] OR;  Service: Open Heart Surgery;;  . JOINT REPLACEMENT     shoulder  . LEFT HEART CATHETERIZATION WITH CORONARY ANGIOGRAM N/A 11/16/2014   Procedure: LEFT HEART CATHETERIZATION WITH CORONARY ANGIOGRAM;  Surgeon: Troy Sine, MD;  Location[facility] C[facility] LAB;  Service: Cardiovascular;  Laterality: N/A;  . MITRAL VALVE REPAIR Right 10/04/2015   Procedure: MINIMALLY INVASIVE MITRAL VALVE REPAIR (MVR);  Surgeon: Rexene Alberts, MD;  Location: [place];  Service: Open Heart Surgery;  Laterality: Right;  . Rt rotator cuff repair    . TEE WITHOUT CARDIOVERSION N/A 06/22/2015   Procedure: TRANSESOPHAGEAL ECHOCARDIOGRAM (TEE);  Surgeon: Skeet Latch, MD;  Location: [place];  Service: Cardiovascular;  Laterality: N/A;  . TEE WITHOUT CARDIOVERSION N/A 10/04/2015   Procedure: TRANSESOPHAGEAL ECHOCARDIOGRAM (TEE);  Surgeon: Rexene Alberts, MD;  Location: [place];  Service: Open Heart Surgery;  Laterality: N/A;  . WOUND EXPLORATION N/A 10/05/2015   Procedure: Sternal Incision;  Surgeon: Rexene Alberts, MD;  Location: [place];  Service: Open Heart Surgery;  Laterality: N/A;   Current Outpatient Medications on File Prior to Visit  Medication Sig Dispense Refill  . allopurinol (ZYLOPRIM) 100 MG tablet TAKE ONE (1) TABLET EACH DAY 90 tablet 2  . cyclobenzaprine (FLEXERIL) 5 MG tablet Take 1 tablet (5 mg total) by mouth 3 (three) times daily as needed for muscle spasms. 30 tablet 0  . docusate sodium (COLACE) 100 MG capsule Take 1 capsule (100 mg total) by mouth daily as needed for moderate constipation. 60 capsule 2  . finasteride (PROSCAR) 5 MG tablet Take 5 mg by mouth at bedtime.    . furosemide (LASIX) 40 MG tablet Take 2 tablets (80 mg total) by mouth 2 (two) times daily. 120 tablet 2  . loratadine (CLARITIN) 10 MG tablet Take 1 tablet (10 mg total) by mouth daily as needed for allergies. 30 tablet 3  . metoprolol tartrate (LOPRESSOR) 25 MG tablet TAKE ONE-HALF TABLET BY MOUTH TWICE DAILY 90 tablet 3  . OVER THE COUNTER MEDICATION [place]: Take 1 capsule by mouth once a day for immune health    . potassium chloride (KLOR-CON) 20 MEQ packet Take 40 mEq by mouth daily. 60 packet 0  . pravastatin (PRAVACHOL) 40 MG tablet TAKE 1 TABLET BY MOUTH EVERY DAY AT 6PM 30 tablet 9  . spironolactone (ALDACTONE) 25 MG tablet Take 1 tablet (25 mg total) by mouth daily. 90 tablet 3  . traMADol (ULTRAM) 50 MG tablet TAKE ONE TABLET BY MOUTH EVERY 6 HOURS AS NEEDED FOR MODERATE PAIN 30 tablet 0  . triamcinolone cream (KENALOG) 0.1 % Apply 1 application topically 2 (two) times daily. 30 g 0  . warfarin (COUMADIN) 4 MG tablet TAKE 1 TABLET(4 MG TOTAL) BY MOUTH DAILY. DO NOT TAKE WITH ANY OTHER WARFARIN UNLESS DISCARD REMAINDER.  INSTRUCTS. 30 tablet 3   No current facility-administered medications on file prior to visit.    No Known Allergies Social History    Socioeconomic History  . Marital status: Married    Spouse name: Not on file  . Number of children: 4  . Years of education: Not on file  . Highest education level: Not on file  Occupational History    Employer: RETIRED  Social Needs  . Financial resource strain: Not on file  . Food insecurity:    Worry: Not on file    Inability: Not on file  . Transportation needs:    Medical: Not on file    Non-medical: Not on file  Tobacco Use  . Smoking status: Former Smoker    Types: Cigarettes, Pipe, Landscape architect  . Smokeless tobacco: Former Systems developer    Types: Chew  . Tobacco comment: QUIT SMOKING  MANY YEARS AGO"  Substance and Sexual Activity  . Alcohol use: No  . Drug use: No  . Sexual activity: Not on file  Lifestyle  . Physical activity:    Days per week: Not on file    Minutes per  session: Not on file  . Stress: Not on file  Relationships  . Social connections:    Talks on phone: Not on file    Gets together: Not on file    Attends religious service: Not on file    Active member of club or organization: Not on file    Attends meetings of clubs or organizations: Not on file    Relationship status: Not on file  . Intimate partner violence:    Fear of current or ex partner: Not on file    Emotionally abused: Not on file    Physically abused: Not on file    Forced sexual activity: Not on file  Other Topics Concern  . Not on file  Social History Narrative   ** Merged History Encounter **       Four adopted children.  Lives alone.       Review of Systems  All other systems reviewed and are negative.      Objective:   Physical Exam  Constitutional: He appears well-developed and well-nourished. No distress.  Cardiovascular: Normal rate. An irregularly irregular rhythm present.  Murmur heard. Pulmonary/Chest: Effort normal. No respiratory distress. He has no wheezes. He has no rhonchi. He has rales.  Abdominal: Soft. Bowel sounds are normal. He exhibits no distension.  There is no abdominal tenderness. There is no rebound.  Musculoskeletal:        General: Edema present.  Skin: He is not diaphoretic.  Vitals reviewed.  He has poor balance and walks with a cane.     Assessment & Plan:  Acute on chronic systolic congestive heart failure ([place]) - Plan: CBC with Differential/Platelet, COMPLETE METABOLIC PANEL WITH GFR  Patient's exam today is markedly different than his exam in early January.  He now has pitting edema and rales on pulmonary exam.  He appears fluid overloaded today.  Furthermore his blood pressure is low.  We discussed going to the hospital for diuresis.  The patient feels fine other than pain in his legs due to the swelling and mild shortness of breath with activity.  He wants to avoid the hospital due to the exposure to flu and other illnesses which I agree would likely kill him if he contracted a secondary infection.  Therefore we will discontinue metoprolol to avoid hypotension after diuresis and increase his Lasix to 3 pills twice daily.  I will see the patient back first thing tomorrow.  Consider using compression Unna boots tomorrow if necessary to help control the swelling.  Explained to the patient and his wife that I am concerned he may be showing worsening signs of congestive heart failure and that this could be a sign that his condition is becoming more serious and potentially life-threatening.  Patient understands and elects to go home.  He will follow-up with me tomorrow and we will attempt outpatient diuresis.  Consider hospice referral if the patient continues to deteriorate

## 2018-09-11 ENCOUNTER — Encounter: Payer: Self-pay | Admitting: Family Medicine

## 2018-09-11 ENCOUNTER — Ambulatory Visit (INDEPENDENT_AMBULATORY_CARE_PROVIDER_SITE_OTHER): Payer: Medicare Other | Admitting: Family Medicine

## 2018-09-11 VITALS — BP 110/70 | HR 63 | Temp 97.4°F | Resp 18 | Ht 67.0 in | Wt 208.0 lb

## 2018-09-11 DIAGNOSIS — M7989 Other specified soft tissue disorders: Secondary | ICD-10-CM

## 2018-09-11 DIAGNOSIS — I5023 Acute on chronic systolic (congestive) heart failure: Secondary | ICD-10-CM

## 2018-09-11 LAB — CBC WITH DIFFERENTIAL/PLATELET
Absolute Monocytes: 543 cells/uL (ref 200–950)
Basophils Absolute: 50 cells/uL (ref 0–200)
Basophils Relative: 0.9 %
EOS PCT: 2 %
Eosinophils Absolute: 112 cells/uL (ref 15–500)
HCT: 42.2 % (ref 38.5–50.0)
Hemoglobin: 14.1 g/dL (ref 13.2–17.1)
Lymphs Abs: 1551 cells/uL (ref 850–3900)
MCH: 31.4 pg (ref 27.0–33.0)
MCHC: 33.4 g/dL (ref 32.0–36.0)
MCV: 94 fL (ref 80.0–100.0)
MPV: 11.5 fL (ref 7.5–12.5)
Monocytes Relative: 9.7 %
Neutro Abs: 3343 cells/uL (ref 1500–7800)
Neutrophils Relative %: 59.7 %
Platelets: 125 10*3/uL — ABNORMAL LOW (ref 140–400)
RBC: 4.49 10*6/uL (ref 4.20–5.80)
RDW: 13.4 % (ref 11.0–15.0)
Total Lymphocyte: 27.7 %
WBC: 5.6 10*3/uL (ref 3.8–10.8)

## 2018-09-11 LAB — COMPLETE METABOLIC PANEL WITH GFR
AG Ratio: 0.9 (calc) — ABNORMAL LOW (ref 1.0–2.5)
ALKALINE PHOSPHATASE (APISO): 150 U/L — AB (ref 35–144)
ALT: 18 U/L (ref 9–46)
AST: 32 U/L (ref 10–35)
Albumin: 3.2 g/dL — ABNORMAL LOW (ref 3.6–5.1)
BUN/Creatinine Ratio: 26 (calc) — ABNORMAL HIGH (ref 6–22)
BUN: 37 mg/dL — ABNORMAL HIGH (ref 7–25)
CO2: 25 mmol/L (ref 20–32)
CREATININE: 1.42 mg/dL — AB (ref 0.70–1.11)
Calcium: 9.2 mg/dL (ref 8.6–10.3)
Chloride: 104 mmol/L (ref 98–110)
GFR, Est African American: 51 mL/min/{1.73_m2} — ABNORMAL LOW (ref >=60)
GFR, Est Non African American: 44 mL/min/{1.73_m2} — ABNORMAL LOW (ref >=60)
Globulin: 3.4 g/dL (calc) (ref 1.9–3.7)
Glucose, Bld: 137 mg/dL — ABNORMAL HIGH (ref 65–99)
Potassium: 4.1 mmol/L (ref 3.5–5.3)
Sodium: 139 mmol/L (ref 135–146)
Total Bilirubin: 1 mg/dL (ref 0.2–1.2)
Total Protein: 6.6 g/dL (ref 6.1–8.1)

## 2018-09-11 NOTE — Progress Notes (Signed)
Subjective:    Patient ID: [name], male    DOB: [birth year], 85 y.o.   MRN: [id number]  HPI  01/15/18 Unfortunately was recently admitted.  I have copied the discharge summary below for reference: Admission date: 01/01/2018 Discharge Date 01/03/2018 Primary MD [name], MD Admitting Physician [name], MD  Admission Diagnosis  Elevated troponin [R74.8] AKI (acute kidney injury) ([place]) [N17.9] Constipation, unspecified constipation type [K59.00]  Discharge Diagnosis   Active Problems: Acute on chronic kidney injury Elevated troponin due to demand ischemia Pancreatitis Recurrent Hepatitis Constipation  Hospital Course  The patient with extensive past medical history most significant for coronary artery disease status post MI and and stent placement, chronic diastolic heart failure as well as significant mitral and tricuspid regurg that is post minimally invasive repair, and chronic subdural hematoma presents to the emergency department complaining of constipation and weakness. Patient had CT abdomen which showed pancreatic cyst pancreatic cyst but no other abnormality.  Patient's abdominal pain resolved after bowel movements.  He is very anxious to go home.  Patient also was noted to have elevated troponin felt to be due to demand ischemia I recommended he follow-up with his cardiologist.   ImagingResults  Ct Abdomen Pelvis W Wo Contrast  Result Date: 01/02/2018 CLINICAL DATA:  Unintended weight loss, abdominal pain. EXAM: CT ABDOMEN AND PELVIS WITHOUT AND WITH CONTRAST TECHNIQUE: Multidetector CT imaging of the abdomen and pelvis was performed following the standard protocol before and following the bolus administration of intravenous contrast. CONTRAST:  74mL ISOVUE-300 IOPAMIDOL (ISOVUE-300) INJECTION 61% COMPARISON:  MR abdomen 07/04/2016 and CT abdomen pelvis 08/02/2015. FINDINGS: Lower chest: Small right pleural effusion with collapse/consolidation in  the adjacent right lower lobe. Pleural calcifications on the right with calcified granulomas. Image quality is degraded by respiratory motion. Heart is moderately enlarged. Atherosclerotic calcification of the arterial vasculature, including coronary arteries and aortic bowel. Pulmonary arteries are enlarged. No pericardial effusion. Hepatobiliary: Liver margin is irregular in the liver appears shrunken. Cholecystectomy. No biliary ductal dilatation. Pancreas: Low-attenuation lesions in the tail of the pancreas measure up to 12 mm, similar to 07/04/2016. No ductal dilatation or gland atrophy. Spleen: Negative. Adrenals/Urinary Tract: Right adrenal gland is unremarkable. Thickening of the body of the left adrenal gland. Subcentimeter low-attenuation lesions in the kidneys are too small to characterize. A hyperdense lesion of the posterior interpolar left kidney measures 1.6 cm, characterized as a hemorrhagic cyst on 07/04/2016. Ureters are decompressed. Prostate indents the bladder. A diverticulum is seen off the right lateral wall of the bladder. Stomach/Bowel: Stomach, small bowel, appendix and colon are unremarkable. Vascular/Lymphatic: Atherosclerotic calcification of the arterial vasculature without abdominal aortic aneurysm. No pathologically enlarged lymph nodes. Reproductive: Prostate is enlarged slightly. Other: No free fluid.  Mesenteries and peritoneum are unremarkable. Musculoskeletal: Degenerative changes in the spine and hips. No worrisome lytic or sclerotic lesions. Old rib fractures. IMPRESSION: 1. No findings to explain the patient's symptoms. 2. Small right pleural effusion with collapse/consolidation in the adjacent right lower lobe. Follow-up to clearing is recommended to exclude malignancy. 3.  Aortic atherosclerosis (ICD10-170.0). 4. Enlarged pulmonary arteries, indicative of pulmonary arterial hypertension. 5. Cirrhosis. 6. Low-attenuation lesions in the tail the pancreas are grossly stable  from 07/04/2016 and may represent pseudocysts. Cystic pancreatic neoplasm cannot be excluded. Follow-up imaging in 2 years is recommended to ensure stability. This recommendation follows ACR consensus guidelines: Management of Incidental Pancreatic Cysts: A White Paper of the ACR Incidental Findings Committee. J Am Coll Radiol  0109;[id number]. 7. Enlarged prostate. Electronically Signed   By: Lorin Picket M.D.   On: 01/02/2018 14:22   Dg Abdomen Acute W/chest  Result Date: 01/01/2018 CLINICAL DATA:  Constipation, weakness, cough EXAM: DG ABDOMEN ACUTE W/ 1V CHEST COMPARISON:  11/10/2016 FINDINGS: Small right pleural effusion. Associated right lower lobe opacity, likely atelectasis. Left lung is clear. No pneumothorax. Stable right rib fractures, likely involving the right posterolateral 3rd through 7th ribs. Nonobstructive bowel gas pattern. No evidence of free air under the diaphragm on the upright view. Cholecystectomy clips. Degenerative changes of the thoracolumbar spine. IMPRESSION: Small right pleural effusion. Associated right lower lobe opacity, likely atelectasis. Stable right rib fractures.  No pneumothorax. No evidence of small bowel obstruction or free air. Electronically Signed   By: Julian Hy M.D.   On: 01/01/2018 23:48     01/15/18 I have reviewed the patient's hospital records.  Since I last saw the patient, he has lost approximately 6 pounds.  He states that he is lost 10 pounds and he is gained for back since leaving the hospital.  His wife states that he is not eating well.  She states that he would just pick at his food and eat very little.  He states that he has trouble swallowing the food and that is why he does not eat very well.  However he states that this is been going on for years and that there has been no sudden change.  He denies any chest pain.  He denies any shortness of breath.  He is still taking 80 mg of Lasix twice a day.  He does this the majority the time.   Occasionally he will reduce his evening dose to 40 mg a day if he feels that he is losing too much weight or if his swelling is down substantially to avoid dehydration.  This current strategy has worked well for him.  His creatinine during his hospitalization was mildly elevated at 1.6.  The patient's baseline is anywhere from 1.4-1.7.  I did review the CT scan which showed stable pancreatic cyst.  They recommended a repeat CT scan in 2 years to ensure stability.  We discussed this today with the patient he is 85 years old.  I question the utility of checking another CT scan when he is 23 given the fact he would be unable to tolerate surgery if they were cancerous.  Therefore we will discuss this in the future whether or not he wants to repeat imaging in over a year.  It also showed stable rib fractures as well as cirrhosis.  Otherwise there were no acute findings.  Patient states he feels better and is starting to get his strength back. At that time, my plan was:  Patient has no peripheral edema on exam today.  He appears to be euvolemic.  This is the lowest his weight has been since I have seen him.  Therefore I believe he is suffering from protein calorie malnutrition.  I have recommended that he add a can of Ensure every day to supplement his oral intake I will check his INR today.  Goal INR is between 2 and 2.5 for this patient.  His heart rate is well controlled.  There is no evidence of fluid overload.  Monitor a CBC as well as a CMP to monitor his potassium and renal function.  I will continue Lasix at 80 mg twice a day and have encouraged the patient to monitor his weight daily and adjust his  Lasix as he has been doing if he loses weight or if he appears dehydrated.  Recheck the patient in 6 weeks or sooner if needed  05/18/18 Wt Readings from Last 3 Encounters:  09/11/18 208 lb (94.3 kg)  09/10/18 208 lb (94.3 kg)  08/14/18 208 lb (94.3 kg)   Patient has gained 9 pounds according to my scales  from the last time he was here.   However at that time, the patient was unable to eat due to dysphagia.  Patient underwent GI work-up.  His dysphagia has improved.  Therefore he is eating much better.  Although gaining weight, there is no obvious edema in his legs.  He has trace bipedal edema which is at his baseline.  His lungs are clear to auscultation bilaterally and he denies any shortness of breath or dyspnea on exertion beyond his baseline.  He is overdue to check an INR today.  However he has 2 primary concerns first he is requesting a power scooter.  Patient has a difficult time walking.  Walking 25 to 30 feet causes him shortness of breath even on his best days.  He frequently has to stop and rest just to catch his breath due to his underlying heart issues.  He also has progressive weakness in his legs.  He walks with a cane however he states that he has fallen on several occasions at home due to poor balance and weakness in his legs.  Timed stand up and go test is performed today.  It takes the patient approximately 10 seconds to stand from a chair.  This can only be accomplished by pushing on the handlebars and pulling himself up on a cabinet corner.  His house can accommodate a power scooter or power wheelchair.  However his arms are too weak to operate a manual wheelchair.  Patient recently felt a pop in his left shoulder.  He is now unable to abduct his left shoulder greater than 80 degrees without significant pain.  He has tremendous weakness with shoulder abduction raising concern for possible rotator cuff tear.  He also has pain with range of motion in his right shoulder.  Due to the pain in his shoulders with range of motion, he has a difficult time propelling himself in a wheelchair.  He believes that if he had a power scooter, he would be able to perform ADLs around his home more easily such as going to the bathroom, bathing, grooming.  It would also facilitate his mobility outside the home and  allow him freedom to go more places and a better quality of life as right now he is essentially confined to his home in his vehicle.  At that time, my plan was:  I believe it is medically appropriate and necessary for the patient have a power mobility device such as a power scooter.  Patient is only able to walk 20 to 30 feet with great difficulty and increased risk of falls due to age, deconditioning, congestive heart failure, and poor balance.  A power mobility device would facilitate his ability to perform ADLs around the home and also allow greater mobility outside the home.  His home can accommodate a power scooter.  He states that the doorways are wide enough to accommodate said device.  Therefore I have instructed the patient to contact a DME and have them fax [place] the requisite forms and I will be glad to complete them on his behalf.  Regarding his atrial fibrillation.  He is overdue  to check an INR.  I reminded the patient that he needs to check this every 4 to 6 weeks.  INR today is within therapeutic limits.  I believe the patient has likely suffered a rotator cuff strain versus partial tear in his left shoulder.  I would not recommend surgery given his age and comorbidities.  Therefore I have recommended tincture of time.  Recheck in 1 week.  If pain persist, we could try therapeutic cortisone injection for comfort.  Consider physical therapy if patient strength is not improving his left shoulder.  Regarding his weight gain, I believe this is most likely due to to increased appetite and better oral intake now that his dysphagia has improved.  Even the patient states that "I am eating now Dr."  Based on his exam I see no reason to increase his diuretic.  08/07/2018 Patient has not been seen since October.  He is long overdue for a Coumadin check.  He presents today with several complaints.  Wife states that he is not eating very well.  He has very little appetite.  However his weight is only down 3  pounds from October.  He complains of pain in his right elbow.  The olecranon bursa on the right elbow is swollen.  It is not erythematous.  It is not warm.  It appears to be bursitis.  However he also reports spells.  He states that every month he will have an episode.  Usually occurs when he is getting out of a car or walking from the car to the house where his vision will turn blue.  He will have to simply stand there and hold on his if he is about to pass out.  Symptoms last a minute or so and then will resolve spontaneously.  Sounds like near syncope versus amaurosis fugax.  However history is very difficult.  At that time, my plan was: The spells that the patient describes sound like orthostatic drops in his blood pressure causing near syncope.  However given his extensive cardiac history, would recommend cardiology consultation for possible cardiac monitor to rule out cardiac arrhythmia.  Will monitor for any electrolyte abnormalities with a CMP and a CBC.  INR is checked today and is therapeutic.  Will make no changes in his Coumadin dosage.  I recommended that we defer treatment of the olecranon bursitis for the time being.  Is no evidence of infection and it is only mildly tender.  Should gradually improve with time  09/10/18 I reviewed the patient's recent visit with his cardiologist.  Since that time, his legs have started to swell dramatically.  He has +2 pitting edema in his right leg up to his knee.  He is unable to wear his compression hose due to the swelling.  He also has +2 pitting edema in his left leg up to his knee.  His legs are so swollen that they hurt.  He also reports dyspnea with exertion which is mild.  He denies any chest pain.  He denies any syncope.  He denies any lightheadedness.  His blood pressure today however is extremely soft at 88/60.  I confirmed this personally.  However he has bibasilar crackles in both lungs dusting fluid overload despite his weight essentially remaining  stable.  At that time, my plan was: Patient's exam today is markedly different than his exam in early January.  He now has pitting edema and rales on pulmonary exam.  He appears fluid overloaded today.  Furthermore his  blood pressure is low.  We discussed going to the hospital for diuresis.  The patient feels fine other than pain in his legs due to the swelling and mild shortness of breath with activity.  He wants to avoid the hospital due to the exposure to flu and other illnesses which I agree would likely kill him if he contracted a secondary infection.  Therefore we will discontinue metoprolol to avoid hypotension after diuresis and increase his Lasix to 3 pills twice daily.  I will see the patient back first thing tomorrow.  Consider using compression Unna boots tomorrow if necessary to help control the swelling.  Explained to the patient and his wife that I am concerned he may be showing worsening signs of congestive heart failure and that this could be a sign that his condition is becoming more serious and potentially life-threatening.  Patient understands and elects to go home.  He will follow-up with me tomorrow and we will attempt outpatient diuresis.  Consider hospice referral if the patient continues to deteriorate  09/11/18 Patient's blood pressure is now improved discontinue metoprolol.  However despite increasing Lasix there has not been substantial improvement in the swelling in his legs.  I believe the patient is more third spacing then fluid overloaded.  Because his legs are swollen so bad he is unable to wear his compression hose and therefore the third spacing and pitting edema in his legs continues to worsen.  He denies any dizziness or syncope or chest pain or shortness of breath Past Medical History:  Diagnosis Date  . Atelectasis of right lung   . Chronic diastolic (congestive) heart failure ([place])    a. 10/2013 Echo: EF 55-60%, basal-mid anteroseptal and basal-mid inferoseptal HK; b.  02/2017 Echo: EF 60-65%, Gr3 DD.  Marland Kitchen Chronic fatigue   . Chronic subdural hematoma ([place])    a. 12/2015 Head CT: chronic right holo-hemispheric SDH w/o midline shift.  . CKD (chronic kidney disease), stage III ([place])   . Coronary artery disease    a. 11/2014 NSTEMI: DESx 2 placed to LAD and RCA; b. 08/2015 Cath: LAD patent stent, LCX 55m, RCA patent stent, 74m.  . Diabetes mellitus, type II ([place])    a. HgA1c 6.8 in 03/2015  . ED (erectile dysfunction)   . Gout   . Hemangioma of liver 08/02/2015   12 mm enhancing lesion seen on CT - suspicious for benign hemangioma but not diagnostic - MRI recommended  . HLD (hyperlipidemia)   . Hypertension   . Hypertensive heart disease   . Ischemic finger ulcer ([place])    a. 03/2017 Seen for digital ulcers - felt to be embolic in setting of PAF. Pt on coumadin.  . Kidney stones   . Lower extremity edema    a. chronic  . OA (osteoarthritis)   . Orthostatic hypotension   . PAF (paroxysmal atrial fibrillation) ([place])    a. 10/2015 s/p DCCV;  b. CHA2DS2VASc = 6--> on coumadin; c. 10/2015 Echo: sev dil LA.  . Prostate cancer ([place])   . Pulmonary HTN ([place])   . Pulmonary hypertension ([place])    a. 10/2015 Echo: PASP 19mmHg.  Marland Kitchen Renal insufficiency   . Severe mitral regurgitation    a.  10/2015 s/p minimally invasive MV repair; b. 10/2015 Echo: mod MS, mean grad 87mmHg; c. 02/2017 Echo: EF 60-65%, Gr3 DD, mod Ca2+ MV annulus, mod to sev dil LA, mod dil RA, mod TR. PASP 3mmHg.  Marland Kitchen Spinal stenosis   . SVT (supraventricular  tachycardia) ([place])    a. recurrent, usually responsive to vagal manuevers  . Tricuspid regurgitation    a. 10/2015 Echo: mild to mod TR.   Past Surgical History:  Procedure Laterality Date  . BACK SURGERY    . Back surgery x 2    . CARDIAC CATHETERIZATION N/A 08/08/2015   Procedure: Right/Left Heart Cath and Coronary Angiography;  Surgeon: Sherren Mocha, MD;  Location: [facility];  Service: Cardiovascular;  Laterality: N/A;  . CARDIAC SURGERY       2 cardiac stents  . CARDIOVERSION N/A 10/20/2015   Procedure: CARDIOVERSION;  Surgeon: Pixie Casino, MD;  Location: [facility];  Service: Cardiovascular;  Laterality: N/A;  . CERVICAL SPINE SURGERY    . CHOLECYSTECTOMY N/A 07/08/2016   Procedure: LAPAROSCOPIC CHOLECYSTECTOMY WITH INTRAOPERATIVE CHOLANGIOGRAM;  Surgeon: Georganna Skeans, MD;  Location: [facility];  Service: General;  Laterality: N/A;  . CORONARY ARTERY BYPASS GRAFT    . ESOPHAGOGASTRODUODENOSCOPY (EGD) WITH PROPOFOL N/A 03/19/2018   Procedure: ESOPHAGOGASTRODUODENOSCOPY (EGD) WITH PROPOFOL;  Surgeon: [name], MD;  Location: [facility] ENDOSCOPY;  Service: Gastroenterology;  Laterality: N/A;  . EYE SURGERY    . INTRAOPERATIVE TRANSESOPHAGEAL ECHOCARDIOGRAM  10/05/2015   Procedure: INTRAOPERATIVE TRANSESOPHAGEAL ECHOCARDIOGRAM;  Surgeon: Rexene Alberts, MD;  Location: [facility] OR;  Service: Open Heart Surgery;;  . JOINT REPLACEMENT     shoulder  . LEFT HEART CATHETERIZATION WITH CORONARY ANGIOGRAM N/A 11/16/2014   Procedure: LEFT HEART CATHETERIZATION WITH CORONARY ANGIOGRAM;  Surgeon: Troy Sine, MD;  Location: [facility] CATH LAB;  Service: Cardiovascular;  Laterality: N/A;  . MITRAL VALVE REPAIR Right 10/04/2015   Procedure: MINIMALLY INVASIVE MITRAL VALVE REPAIR (MVR);  Surgeon: Rexene Alberts, MD;  Location: [facility];  Service: Open Heart Surgery;  Laterality: Right;  . Rt rotator cuff repair    . TEE WITHOUT CARDIOVERSION N/A 06/22/2015   Procedure: TRANSESOPHAGEAL ECHOCARDIOGRAM (TEE);  Surgeon: Skeet Latch, MD;  Location: [place];  Service: Cardiovascular;  Laterality: N/A;  . TEE WITHOUT CARDIOVERSION N/A 10/04/2015   Procedure: TRANSESOPHAGEAL ECHOCARDIOGRAM (TEE);  Surgeon: Rexene Alberts, MD;  Location: [place];  Service: Open Heart Surgery;  Laterality: N/A;  . WOUND EXPLORATION N/A 10/05/2015   Procedure: Sternal Incision;  Surgeon: Rexene Alberts, MD;  Location: [place];  Service: Open Heart Surgery;  Laterality: N/A;   Current  Outpatient Medications on File Prior to Visit  Medication Sig Dispense Refill  . allopurinol (ZYLOPRIM) 100 MG tablet TAKE ONE (1) TABLET EACH DAY 90 tablet 2  . cyclobenzaprine (FLEXERIL) 5 MG tablet Take 1 tablet (5 mg total) by mouth 3 (three) times daily as needed for muscle spasms. 30 tablet 0  . docusate sodium (COLACE) 100 MG capsule Take 1 capsule (100 mg total) by mouth daily as needed for moderate constipation. 60 capsule 2  . finasteride (PROSCAR) 5 MG tablet Take 5 mg by mouth at bedtime.    . furosemide (LASIX) 40 MG tablet Take 2 tablets (80 mg total) by mouth 2 (two) times daily. 120 tablet 2  . loratadine (CLARITIN) 10 MG tablet Take 1 tablet (10 mg total) by mouth daily as needed for allergies. 30 tablet 3  . metoprolol tartrate (LOPRESSOR) 25 MG tablet TAKE ONE-HALF TABLET BY MOUTH TWICE DAILY 90 tablet 3  . OVER THE COUNTER MEDICATION [place]: Take 1 capsule by mouth once a day for immune health    . potassium chloride (KLOR-CON) 20 MEQ packet Take 40 mEq by mouth daily. 60 packet  0  . pravastatin (PRAVACHOL) 40 MG tablet TAKE 1 TABLET BY MOUTH EVERY DAY AT 6PM 30 tablet 9  . spironolactone (ALDACTONE) 25 MG tablet Take 1 tablet (25 mg total) by mouth daily. 90 tablet 3  . traMADol (ULTRAM) 50 MG tablet TAKE ONE TABLET BY MOUTH EVERY 6 HOURS AS NEEDED FOR MODERATE PAIN 30 tablet 0  . triamcinolone cream (KENALOG) 0.1 % Apply 1 application topically 2 (two) times daily. 30 g 0  . warfarin (COUMADIN) 4 MG tablet TAKE 1 TABLET(4 MG TOTAL) BY MOUTH DAILY. DO NOT TAKE WITH ANY OTHER WARFARIN UNLESS DISCARD REMAINDER. Armend Hochstatter INSTRUCTS. 30 tablet 3   No current facility-administered medications on file prior to visit.    No Known Allergies Social History   Socioeconomic History  . Marital status: Married    Spouse name: Not on file  . Number of children: 4  . Years of education: Not on file  . Highest education level: Not on file  Occupational History    Employer: RETIRED   Social Needs  . Financial resource strain: Not on file  . Food insecurity:    Worry: Not on file    Inability: Not on file  . Transportation needs:    Medical: Not on file    Non-medical: Not on file  Tobacco Use  . Smoking status: Former Smoker    Types: Cigarettes, Pipe, Landscape architect  . Smokeless tobacco: Former Systems developer    Types: Chew  . Tobacco comment: QUIT SMOKING  MANY YEARS AGO"  Substance and Sexual Activity  . Alcohol use: No  . Drug use: No  . Sexual activity: Not on file  Lifestyle  . Physical activity:    Days per week: Not on file    Minutes per session: Not on file  . Stress: Not on file  Relationships  . Social connections:    Talks on phone: Not on file    Gets together: Not on file    Attends religious service: Not on file    Active member of club or organization: Not on file    Attends meetings of clubs or organizations: Not on file    Relationship status: Not on file  . Intimate partner violence:    Fear of current or ex partner: Not on file    Emotionally abused: Not on file    Physically abused: Not on file    Forced sexual activity: Not on file  Other Topics Concern  . Not on file  Social History Narrative   ** Merged History Encounter **       Four adopted children.  Lives alone.       Review of Systems  All other systems reviewed and are negative.      Objective:   Physical Exam  Constitutional: He appears well-developed and well-nourished. No distress.  Cardiovascular: Normal rate. An irregularly irregular rhythm present.  Murmur heard. Pulmonary/Chest: Effort normal. No respiratory distress. He has no wheezes. He has no rhonchi. He has rales.  Abdominal: Soft. Bowel sounds are normal. He exhibits no distension. There is no abdominal tenderness. There is no rebound.  Musculoskeletal:        General: Edema present.  Skin: He is not diaphoretic.  Vitals reviewed.  He has poor balance and walks with a cane.     Assessment & Plan:    Chronic venous insufficiency coupled with congestive heart failure with leg swelling  Tinea Lasix 3 tablets twice daily however I placed the patient  in bilateral Unna boots today in an effort to help with the third spacing to drive the pitting edema from the interstitial tissues back into the blood vessels where the diuretics can be more effective.  We will see the patient back on Monday to remove the Unna boots and at that time we will switch him back to his compression hose if the swelling has improved.  Resume previous dose of Lasix, 2 pills twice a day on Sunday to avoid dehydration.  If swelling is better Monday, will resume the metoprolol at that time after we have decreased his Lasix

## 2018-09-14 ENCOUNTER — Ambulatory Visit (INDEPENDENT_AMBULATORY_CARE_PROVIDER_SITE_OTHER): Payer: Medicare Other | Admitting: Family Medicine

## 2018-09-14 ENCOUNTER — Encounter: Payer: Self-pay | Admitting: Family Medicine

## 2018-09-14 VITALS — BP 126/80 | HR 64 | Temp 98.7°F | Resp 18 | Ht 67.0 in | Wt 209.0 lb

## 2018-09-14 DIAGNOSIS — I4819 Other persistent atrial fibrillation: Secondary | ICD-10-CM

## 2018-09-14 DIAGNOSIS — M7989 Other specified soft tissue disorders: Secondary | ICD-10-CM

## 2018-09-14 LAB — PT WITH INR/FINGERSTICK
INR, fingerstick: 3 ratio — ABNORMAL HIGH
PT, fingerstick: 35.6 s — ABNORMAL HIGH (ref 10.5–13.1)

## 2018-09-14 NOTE — Progress Notes (Signed)
Subjective:    Patient ID: [name], male    DOB: 10/14/1928, 85 y.o.   MRN: [id number]  HPI  01/15/18 Unfortunately was recently admitted.  I have copied the discharge summary below for reference: Admission date: 01/01/2018 Discharge Date 01/03/2018 Primary MD [name], MD Admitting Physician [name], MD  Admission Diagnosis  Elevated troponin [R74.8] AKI (acute kidney injury) ([place]) [N17.9] Constipation, unspecified constipation type [K59.00]  Discharge Diagnosis   Active Problems: Acute on chronic kidney injury Elevated troponin due to demand ischemia Pancreatitis Recurrent Hepatitis Constipation  Hospital Course  The patient with extensive past medical history most significant for coronary artery disease status post MI and and stent placement, chronic diastolic heart failure as well as significant mitral and tricuspid regurg that is post minimally invasive repair, and chronic subdural hematoma presents to the emergency department complaining of constipation and weakness. Patient had CT abdomen which showed pancreatic cyst pancreatic cyst but no other abnormality.  Patient's abdominal pain resolved after bowel movements.  He is very anxious to go home.  Patient also was noted to have elevated troponin felt to be due to demand ischemia I recommended he follow-up with his cardiologist.   ImagingResults  Ct Abdomen Pelvis W Wo Contrast  Result Date: 01/02/2018 CLINICAL DATA:  Unintended weight loss, abdominal pain. EXAM: CT ABDOMEN AND PELVIS WITHOUT AND WITH CONTRAST TECHNIQUE: Multidetector CT imaging of the abdomen and pelvis was performed following the standard protocol before and following the bolus administration of intravenous contrast. CONTRAST:  38mL ISOVUE-300 IOPAMIDOL (ISOVUE-300) INJECTION 61% COMPARISON:  MR abdomen 07/04/2016 and CT abdomen pelvis 08/02/2015. FINDINGS: Lower chest: Small right pleural effusion with collapse/consolidation in  the adjacent right lower lobe. Pleural calcifications on the right with calcified granulomas. Image quality is degraded by respiratory motion. Heart is moderately enlarged. Atherosclerotic calcification of the arterial vasculature, including coronary arteries and aortic bowel. Pulmonary arteries are enlarged. No pericardial effusion. Hepatobiliary: Liver margin is irregular in the liver appears shrunken. Cholecystectomy. No biliary ductal dilatation. Pancreas: Low-attenuation lesions in the tail of the pancreas measure up to 12 mm, similar to 07/04/2016. No ductal dilatation or gland atrophy. Spleen: Negative. Adrenals/Urinary Tract: Right adrenal gland is unremarkable. Thickening of the body of the left adrenal gland. Subcentimeter low-attenuation lesions in the kidneys are too small to characterize. A hyperdense lesion of the posterior interpolar left kidney measures 1.6 cm, characterized as a hemorrhagic cyst on 07/04/2016. Ureters are decompressed. Prostate indents the bladder. A diverticulum is seen off the right lateral wall of the bladder. Stomach/Bowel: Stomach, small bowel, appendix and colon are unremarkable. Vascular/Lymphatic: Atherosclerotic calcification of the arterial vasculature without abdominal aortic aneurysm. No pathologically enlarged lymph nodes. Reproductive: Prostate is enlarged slightly. Other: No free fluid.  Mesenteries and peritoneum are unremarkable. Musculoskeletal: Degenerative changes in the spine and hips. No worrisome lytic or sclerotic lesions. Old rib fractures. IMPRESSION: 1. No findings to explain the patient's symptoms. 2. Small right pleural effusion with collapse/consolidation in the adjacent right lower lobe. Follow-up to clearing is recommended to exclude malignancy. 3.  Aortic atherosclerosis (ICD10-170.0). 4. Enlarged pulmonary arteries, indicative of pulmonary arterial hypertension. 5. Cirrhosis. 6. Low-attenuation lesions in the tail the pancreas are grossly stable  from 07/04/2016 and may represent pseudocysts. Cystic pancreatic neoplasm cannot be excluded. Follow-up imaging in 2 years is recommended to ensure stability. This recommendation follows ACR consensus guidelines: Management of Incidental Pancreatic Cysts: A White Paper of the ACR Incidental Findings Committee. J Am Coll Radiol  [id number];33:[id number]. 7. Enlarged prostate. Electronically Signed   By: Lorin Picket M.D.   On: 01/02/2018 14:22   Dg Abdomen Acute W/chest  Result Date: 01/01/2018 CLINICAL DATA:  Constipation, weakness, cough EXAM: DG ABDOMEN ACUTE W/ 1V CHEST COMPARISON:  11/10/2016 FINDINGS: Small right pleural effusion. Associated right lower lobe opacity, likely atelectasis. Left lung is clear. No pneumothorax. Stable right rib fractures, likely involving the right posterolateral 3rd through 7th ribs. Nonobstructive bowel gas pattern. No evidence of free air under the diaphragm on the upright view. Cholecystectomy clips. Degenerative changes of the thoracolumbar spine. IMPRESSION: Small right pleural effusion. Associated right lower lobe opacity, likely atelectasis. Stable right rib fractures.  No pneumothorax. No evidence of small bowel obstruction or free air. Electronically Signed   By: Julian Hy M.D.   On: 01/01/2018 23:48     01/15/18 I have reviewed the patient's hospital records.  Since I last saw the patient, he has lost approximately 6 pounds.  He states that he is lost 10 pounds and he is gained for back since leaving the hospital.  His wife states that he is not eating well.  She states that he would just pick at his food and eat very little.  He states that he has trouble swallowing the food and that is why he does not eat very well.  However he states that this is been going on for years and that there has been no sudden change.  He denies any chest pain.  He denies any shortness of breath.  He is still taking 80 mg of Lasix twice a day.  He does this the majority the time.   Occasionally he will reduce his evening dose to 40 mg a day if he feels that he is losing too much weight or if his swelling is down substantially to avoid dehydration.  This current strategy has worked well for him.  His creatinine during his hospitalization was mildly elevated at 1.6.  The patient's baseline is anywhere from 1.4-1.7.  I did review the CT scan which showed stable pancreatic cyst.  They recommended a repeat CT scan in 2 years to ensure stability.  We discussed this today with the patient he is 85 years old.  I question the utility of checking another CT scan when he is 46 given the fact he would be unable to tolerate surgery if they were cancerous.  Therefore we will discuss this in the future whether or not he wants to repeat imaging in over a year.  It also showed stable rib fractures as well as cirrhosis.  Otherwise there were no acute findings.  Patient states he feels better and is starting to get his strength back. At that time, my plan was:  Patient has no peripheral edema on exam today.  He appears to be euvolemic.  This is the lowest his weight has been since I have seen him.  Therefore I believe he is suffering from protein calorie malnutrition.  I have recommended that he add a can of Ensure every day to supplement his oral intake I will check his INR today.  Goal INR is between 2 and 2.5 for this patient.  His heart rate is well controlled.  There is no evidence of fluid overload.  Monitor a CBC as well as a CMP to monitor his potassium and renal function.  I will continue Lasix at 80 mg twice a day and have encouraged the patient to monitor his weight daily and adjust his  Lasix as he has been doing if he loses weight or if he appears dehydrated.  Recheck the patient in 6 weeks or sooner if needed  05/18/18 Wt Readings from Last 3 Encounters:  09/14/18 209 lb (94.8 kg)  09/11/18 208 lb (94.3 kg)  09/10/18 208 lb (94.3 kg)   Patient has gained 9 pounds according to my scales  from the last time he was here.   However at that time, the patient was unable to eat due to dysphagia.  Patient underwent GI work-up.  His dysphagia has improved.  Therefore he is eating much better.  Although gaining weight, there is no obvious edema in his legs.  He has trace bipedal edema which is at his baseline.  His lungs are clear to auscultation bilaterally and he denies any shortness of breath or dyspnea on exertion beyond his baseline.  He is overdue to check an INR today.  However he has 2 primary concerns first he is requesting a power scooter.  Patient has a difficult time walking.  Walking 25 to 30 feet causes him shortness of breath even on his best days.  He frequently has to stop and rest just to catch his breath due to his underlying heart issues.  He also has progressive weakness in his legs.  He walks with a cane however he states that he has fallen on several occasions at home due to poor balance and weakness in his legs.  Timed stand up and go test is performed today.  It takes the patient approximately 10 seconds to stand from a chair.  This can only be accomplished by pushing on the handlebars and pulling himself up on a cabinet corner.  His house can accommodate a power scooter or power wheelchair.  However his arms are too weak to operate a manual wheelchair.  Patient recently felt a pop in his left shoulder.  He is now unable to abduct his left shoulder greater than 80 degrees without significant pain.  He has tremendous weakness with shoulder abduction raising concern for possible rotator cuff tear.  He also has pain with range of motion in his right shoulder.  Due to the pain in his shoulders with range of motion, he has a difficult time propelling himself in a wheelchair.  He believes that if he had a power scooter, he would be able to perform ADLs around his home more easily such as going to the bathroom, bathing, grooming.  It would also facilitate his mobility outside the home and  allow him freedom to go more places and a better quality of life as right now he is essentially confined to his home in his vehicle.  At that time, my plan was:  I believe it is medically appropriate and necessary for the patient have a power mobility device such as a power scooter.  Patient is only able to walk 20 to 30 feet with great difficulty and increased risk of falls due to age, deconditioning, congestive heart failure, and poor balance.  A power mobility device would facilitate his ability to perform ADLs around the home and also allow greater mobility outside the home.  His home can accommodate a power scooter.  He states that the doorways are wide enough to accommodate said device.  Therefore I have instructed the patient to contact a DME and have them fax [place] the requisite forms and I will be glad to complete them on his behalf.  Regarding his atrial fibrillation.  He is overdue  to check an INR.  I reminded the patient that he needs to check this every 4 to 6 weeks.  INR today is within therapeutic limits.  I believe the patient has likely suffered a rotator cuff strain versus partial tear in his left shoulder.  I would not recommend surgery given his age and comorbidities.  Therefore I have recommended tincture of time.  Recheck in 1 week.  If pain persist, we could try therapeutic cortisone injection for comfort.  Consider physical therapy if patient strength is not improving his left shoulder.  Regarding his weight gain, I believe this is most likely due to to increased appetite and better oral intake now that his dysphagia has improved.  Even the patient states that "I am eating now Dr."  Based on his exam I see no reason to increase his diuretic.  08/07/2018 Patient has not been seen since October.  He is long overdue for a Coumadin check.  He presents today with several complaints.  Wife states that he is not eating very well.  He has very little appetite.  However his weight is only down 3  pounds from October.  He complains of pain in his right elbow.  The olecranon bursa on the right elbow is swollen.  It is not erythematous.  It is not warm.  It appears to be bursitis.  However he also reports spells.  He states that every month he will have an episode.  Usually occurs when he is getting out of a car or walking from the car to the house where his vision will turn blue.  He will have to simply stand there and hold on his if he is about to pass out.  Symptoms last a minute or so and then will resolve spontaneously.  Sounds like near syncope versus amaurosis fugax.  However history is very difficult.  At that time, my plan was: The spells that the patient describes sound like orthostatic drops in his blood pressure causing near syncope.  However given his extensive cardiac history, would recommend cardiology consultation for possible cardiac monitor to rule out cardiac arrhythmia.  Will monitor for any electrolyte abnormalities with a CMP and a CBC.  INR is checked today and is therapeutic.  Will make no changes in his Coumadin dosage.  I recommended that we defer treatment of the olecranon bursitis for the time being.  Is no evidence of infection and it is only mildly tender.  Should gradually improve with time  09/10/18 I reviewed the patient's recent visit with his cardiologist.  Since that time, his legs have started to swell dramatically.  He has +2 pitting edema in his right leg up to his knee.  He is unable to wear his compression hose due to the swelling.  He also has +2 pitting edema in his left leg up to his knee.  His legs are so swollen that they hurt.  He also reports dyspnea with exertion which is mild.  He denies any chest pain.  He denies any syncope.  He denies any lightheadedness.  His blood pressure today however is extremely soft at 88/60.  I confirmed this personally.  However he has bibasilar crackles in both lungs dusting fluid overload despite his weight essentially remaining  stable.  At that time, my plan was: Patient's exam today is markedly different than his exam in early January.  He now has pitting edema and rales on pulmonary exam.  He appears fluid overloaded today.  Furthermore his  blood pressure is low.  We discussed going to the hospital for diuresis.  The patient feels fine other than pain in his legs due to the swelling and mild shortness of breath with activity.  He wants to avoid the hospital due to the exposure to flu and other illnesses which I agree would likely kill him if he contracted a secondary infection.  Therefore we will discontinue metoprolol to avoid hypotension after diuresis and increase his Lasix to 3 pills twice daily.  I will see the patient back first thing tomorrow.  Consider using compression Unna boots tomorrow if necessary to help control the swelling.  Explained to the patient and his wife that I am concerned he may be showing worsening signs of congestive heart failure and that this could be a sign that his condition is becoming more serious and potentially life-threatening.  Patient understands and elects to go home.  He will follow-up with me tomorrow and we will attempt outpatient diuresis.  Consider hospice referral if the patient continues to deteriorate  09/11/18 Patient's blood pressure is now improved discontinue metoprolol.  However despite increasing Lasix there has not been substantial improvement in the swelling in his legs.  I believe the patient is more third spacing then fluid overloaded.  Because his legs are swollen so bad he is unable to wear his compression hose and therefore the third spacing and pitting edema in his legs continues to worsen.  He denies any dizziness or syncope or chest pain or shortness of breath.  At that time, my plan was: Continue Lasix 3 tablets twice daily however I placed the patient in bilateral Unna boots today in an effort to help with the third spacing to drive the pitting edema from the  interstitial tissues back into the blood vessels where the diuretics can be more effective.  We will see the patient back on Monday to remove the Unna boots and at that time we will switch him back to his compression hose if the swelling has improved.  Resume previous dose of Lasix, 2 pills twice a day on Sunday to avoid dehydration.  If swelling is better Monday, will resume the metoprolol at that time after we have decreased his Lasix  09/14/18 Patient is here today to follow-up.  I removed his Unna boots.  The swelling had improved dramatically in both feet and is down to normal now.  Patient brings in a brand-new pair of compression hose for me to apply to his legs today.  I was able to put the patient back into his compression support hose today after I remove the Unna boots.  His blood pressure is also better and now appears stable to resume metoprolol especially given that he is no longer taking the elevated dose of Lasix Past Medical History:  Diagnosis Date  . Atelectasis of right lung   . Chronic diastolic (congestive) heart failure ([place])    a. 10/2013 Echo: EF 55-60%, basal-mid anteroseptal and basal-mid inferoseptal HK; b. 02/2017 Echo: EF 60-65%, Gr3 DD.  Marland Kitchen Chronic fatigue   . Chronic subdural hematoma ([place])    a. 12/2015 Head CT: chronic right holo-hemispheric SDH w/o midline shift.  . CKD (chronic kidney disease), stage III ([place])   . Coronary artery disease    a. 11/2014 NSTEMI: DESx 2 placed to LAD and RCA; b. 08/2015 Cath: LAD patent stent, LCX 23m, RCA patent stent, 63m.  . Diabetes mellitus, type II ([place])    a. HgA1c 6.8 in 03/2015  .  ED (erectile dysfunction)   . Gout   . Hemangioma of liver 08/02/2015   12 mm enhancing lesion seen on CT - suspicious for benign hemangioma but not diagnostic - MRI recommended  . HLD (hyperlipidemia)   . Hypertension   . Hypertensive heart disease   . Ischemic finger ulcer ([place])    a. 03/2017 Seen for digital ulcers - felt to be embolic in setting  of PAF. Pt on coumadin.  . Kidney stones   . Lower extremity edema    a. chronic  . OA (osteoarthritis)   . Orthostatic hypotension   . PAF (paroxysmal atrial fibrillation) ([place])    a. 10/2015 s/p DCCV;  b. CHA2DS2VASc = 6--> on coumadin; c. 10/2015 Echo: sev dil LA.  . Prostate cancer ([place])   . Pulmonary HTN ([place])   . Pulmonary hypertension ([place])    a. 10/2015 Echo: PASP 28mmHg.  Marland Kitchen Renal insufficiency   . Severe mitral regurgitation    a.  10/2015 s/p minimally invasive MV repair; b. 10/2015 Echo: mod MS, mean grad 79mmHg; c. 02/2017 Echo: EF 60-65%, Gr3 DD, mod Ca2+ MV annulus, mod to sev dil LA, mod dil RA, mod TR. PASP 78mmHg.  Marland Kitchen Spinal stenosis   . SVT (supraventricular tachycardia) ([facility])    a. recurrent, usually responsive to vagal manuevers  . Tricuspid regurgitation    a. 10/2015 Echo: mild to mod TR.   Past Surgical History:  Procedure Laterality Date  . BACK SURGERY    . Back surgery x 2    . CARDIAC CATHETERIZATION N/A 08/08/2015   Procedure: Right/Left Heart Cath and Coronary Angiography;  Surgeon: Sherren Mocha, MD;  Location: [facility];  Service: Cardiovascular;  Laterality: N/A;  . CARDIAC SURGERY     2 cardiac stents  . CARDIOVERSION N/A 10/20/2015   Procedure: CARDIOVERSION;  Surgeon: Pixie Casino, MD;  Location: [facility];  Service: Cardiovascular;  Laterality: N/A;  . CERVICAL SPINE SURGERY    . CHOLECYSTECTOMY N/A 07/08/2016   Procedure: LAPAROSCOPIC CHOLECYSTECTOMY WITH INTRAOPERATIVE CHOLANGIOGRAM;  Surgeon: Georganna Skeans, MD;  Location: [facility];  Service: General;  Laterality: N/A;  . CORONARY ARTERY BYPASS GRAFT    . ESOPHAGOGASTRODUODENOSCOPY (EGD) WITH PROPOFOL N/A 03/19/2018   Procedure: ESOPHAGOGASTRODUODENOSCOPY (EGD) WITH PROPOFOL;  Surgeon: [name], MD;  Location: [facility] ENDOSCOPY;  Service: Gastroenterology;  Laterality: N/A;  . EYE SURGERY    . INTRAOPERATIVE TRANSESOPHAGEAL ECHOCARDIOGRAM  10/05/2015   Procedure: INTRAOPERATIVE  TRANSESOPHAGEAL ECHOCARDIOGRAM;  Surgeon: Rexene Alberts, MD;  Location: [facility] OR;  Service: Open Heart Surgery;;  . JOINT REPLACEMENT     shoulder  . LEFT HEART CATHETERIZATION WITH CORONARY ANGIOGRAM N/A 11/16/2014   Procedure: LEFT HEART CATHETERIZATION WITH CORONARY ANGIOGRAM;  Surgeon: Troy Sine, MD;  Location: [facility] CATH LAB;  Service: Cardiovascular;  Laterality: N/A;  . MITRAL VALVE REPAIR Right 10/04/2015   Procedure: MINIMALLY INVASIVE MITRAL VALVE REPAIR (MVR);  Surgeon: Rexene Alberts, MD;  Location: [facility];  Service: Open Heart Surgery;  Laterality: Right;  . Rt rotator cuff repair    . TEE WITHOUT CARDIOVERSION N/A 06/22/2015   Procedure: TRANSESOPHAGEAL ECHOCARDIOGRAM (TEE);  Surgeon: Skeet Latch, MD;  Location: [facility];  Service: Cardiovascular;  Laterality: N/A;  . TEE WITHOUT CARDIOVERSION N/A 10/04/2015   Procedure: TRANSESOPHAGEAL ECHOCARDIOGRAM (TEE);  Surgeon: Rexene Alberts, MD;  Location: [facility];  Service: Open Heart Surgery;  Laterality: N/A;  . WOUND EXPLORATION N/A 10/05/2015   Procedure: Sternal Incision;  Surgeon: Braulio Conte  Keturah Barre, MD;  Location: [facility];  Service: Open Heart Surgery;  Laterality: N/A;   Current Outpatient Medications on File Prior to Visit  Medication Sig Dispense Refill  . allopurinol (ZYLOPRIM) 100 MG tablet TAKE ONE (1) TABLET EACH DAY 90 tablet 2  . cyclobenzaprine (FLEXERIL) 5 MG tablet Take 1 tablet (5 mg total) by mouth 3 (three) times daily as needed for muscle spasms. 30 tablet 0  . docusate sodium (COLACE) 100 MG capsule Take 1 capsule (100 mg total) by mouth daily as needed for moderate constipation. 60 capsule 2  . finasteride (PROSCAR) 5 MG tablet Take 5 mg by mouth at bedtime.    . furosemide (LASIX) 40 MG tablet Take 2 tablets (80 mg total) by mouth 2 (two) times daily. 120 tablet 2  . loratadine (CLARITIN) 10 MG tablet Take 1 tablet (10 mg total) by mouth daily as needed for allergies. 30 tablet 3  . metoprolol tartrate (LOPRESSOR)  25 MG tablet TAKE ONE-HALF TABLET BY MOUTH TWICE DAILY 90 tablet 3  . OVER THE COUNTER MEDICATION [place]: Take 1 capsule by mouth once a day for immune health    . potassium chloride (KLOR-CON) 20 MEQ packet Take 40 mEq by mouth daily. 60 packet 0  . pravastatin (PRAVACHOL) 40 MG tablet TAKE 1 TABLET BY MOUTH EVERY DAY AT 6PM 30 tablet 9  . spironolactone (ALDACTONE) 25 MG tablet Take 1 tablet (25 mg total) by mouth daily. 90 tablet 3  . traMADol (ULTRAM) 50 MG tablet TAKE ONE TABLET BY MOUTH EVERY 6 HOURS AS NEEDED FOR MODERATE PAIN 30 tablet 0  . triamcinolone cream (KENALOG) 0.1 % Apply 1 application topically 2 (two) times daily. 30 g 0  . warfarin (COUMADIN) 4 MG tablet TAKE 1 TABLET(4 MG TOTAL) BY MOUTH DAILY. DO NOT TAKE WITH ANY OTHER WARFARIN UNLESS DISCARD REMAINDER.  INSTRUCTS. 30 tablet 3   No current facility-administered medications on file prior to visit.    No Known Allergies Social History   Socioeconomic History  . Marital status: Married    Spouse name: Not on file  . Number of children: 4  . Years of education: Not on file  . Highest education level: Not on file  Occupational History    Employer: RETIRED  Social Needs  . Financial resource strain: Not on file  . Food insecurity:    Worry: Not on file    Inability: Not on file  . Transportation needs:    Medical: Not on file    Non-medical: Not on file  Tobacco Use  . Smoking status: Former Smoker    Types: Cigarettes, Pipe, Landscape architect  . Smokeless tobacco: Former Systems developer    Types: Chew  . Tobacco comment: QUIT SMOKING  MANY YEARS AGO"  Substance and Sexual Activity  . Alcohol use: No  . Drug use: No  . Sexual activity: Not on file  Lifestyle  . Physical activity:    Days per week: Not on file    Minutes per session: Not on file  . Stress: Not on file  Relationships  . Social connections:    Talks on phone: Not on file    Gets together: Not on file    Attends religious service: Not on file     Active member of club or organization: Not on file    Attends meetings of clubs or organizations: Not on file    Relationship status: Not on file  . Intimate partner violence:    Fear  of current or ex partner: Not on file    Emotionally abused: Not on file    Physically abused: Not on file    Forced sexual activity: Not on file  Other Topics Concern  . Not on file  Social History Narrative   ** Merged History Encounter **       Four adopted children.  Lives alone.       Review of Systems  All other systems reviewed and are negative.      Objective:   Physical Exam  Constitutional: He appears well-developed and well-nourished. No distress.  Cardiovascular: Normal rate. An irregularly irregular rhythm present.  Murmur heard. Pulmonary/Chest: Effort normal. No respiratory distress. He has no wheezes. He has no rhonchi. He has no rales.  Abdominal: Soft. Bowel sounds are normal. He exhibits no distension. There is no abdominal tenderness. There is no rebound.  Musculoskeletal:        General: No edema.  Skin: He is not diaphoretic.  Vitals reviewed.  He has poor balance and walks with a cane.     Assessment & Plan:  Chronic venous insufficiency with leg swelling  I remove the patient's [place] boots.  He was placed in compression hose bilaterally.  Continue to wear compression hose on a daily basis to control swelling.  Resume previous dose of Lasix 80 mg twice daily.Marland Kitchen  Resume metoprolol 12.5 mg p.o. twice daily

## 2018-10-02 ENCOUNTER — Telehealth: Payer: Self-pay | Admitting: Family Medicine

## 2018-10-02 MED ORDER — METOPROLOL TARTRATE 25 MG PO TABS: 12.5000 mg | ORAL_TABLET | Freq: Two times a day (BID) | ORAL | 3 refills | Status: DC

## 2018-10-02 NOTE — Telephone Encounter (Signed)
1) Medication(s) Requested (by name): Metoprolol   2) Pharmacy of Choice: [facility] at [facility]  3) Special Requests: will pick it up tomorrow please.

## 2018-10-02 NOTE — Telephone Encounter (Signed)
Medication called/sent to requested pharmacy  

## 2018-10-08 ENCOUNTER — Ambulatory Visit (INDEPENDENT_AMBULATORY_CARE_PROVIDER_SITE_OTHER): Payer: Medicare Other | Admitting: Family Medicine

## 2018-10-08 ENCOUNTER — Encounter: Payer: Self-pay | Admitting: Family Medicine

## 2018-10-08 ENCOUNTER — Other Ambulatory Visit: Payer: Self-pay | Admitting: Family Medicine

## 2018-10-08 VITALS — BP 100/62 | HR 94 | Temp 97.9°F | Resp 14 | Ht 67.0 in | Wt 212.0 lb

## 2018-10-08 DIAGNOSIS — M7989 Other specified soft tissue disorders: Secondary | ICD-10-CM | POA: Diagnosis not present

## 2018-10-08 MED ORDER — BUMETANIDE 2 MG PO TABS: 2.0000 mg | ORAL_TABLET | Freq: Two times a day (BID) | ORAL | 0 refills | Status: DC

## 2018-10-08 NOTE — Progress Notes (Signed)
Subjective:    Patient ID: [name], male    DOB: Sep 01, 1928, 85 y.o.   MRN: [id number]  HPI  01/15/18 Unfortunately was recently admitted.  I have copied the discharge summary below for reference: Admission date: 01/01/2018 Discharge Date 01/03/2018 Primary MD [name], MD Admitting Physician [name], MD  Admission Diagnosis  Elevated troponin [R74.8] AKI (acute kidney injury) ([place]) [N17.9] Constipation, unspecified constipation type [K59.00]  Discharge Diagnosis   Active Problems: Acute on chronic kidney injury Elevated troponin due to demand ischemia Pancreatitis Recurrent Hepatitis Constipation  Hospital Course  The patient with extensive past medical history most significant for coronary artery disease status post MI and and stent placement, chronic diastolic heart failure as well as significant mitral and tricuspid regurg that is post minimally invasive repair, and chronic subdural hematoma presents to the emergency department complaining of constipation and weakness. Patient had CT abdomen which showed pancreatic cyst pancreatic cyst but no other abnormality.  Patient's abdominal pain resolved after bowel movements.  He is very anxious to go home.  Patient also was noted to have elevated troponin felt to be due to demand ischemia I recommended he follow-up with his cardiologist.   ImagingResults  Ct Abdomen Pelvis W Wo Contrast  Result Date: 01/02/2018 CLINICAL DATA:  Unintended weight loss, abdominal pain. EXAM: CT ABDOMEN AND PELVIS WITHOUT AND WITH CONTRAST TECHNIQUE: Multidetector CT imaging of the abdomen and pelvis was performed following the standard protocol before and following the bolus administration of intravenous contrast. CONTRAST:  55mL ISOVUE-300 IOPAMIDOL (ISOVUE-300) INJECTION 61% COMPARISON:  MR abdomen 07/04/2016 and CT abdomen pelvis 08/02/2015. FINDINGS: Lower chest: Small right pleural effusion with collapse/consolidation in  the adjacent right lower lobe. Pleural calcifications on the right with calcified granulomas. Image quality is degraded by respiratory motion. Heart is moderately enlarged. Atherosclerotic calcification of the arterial vasculature, including coronary arteries and aortic bowel. Pulmonary arteries are enlarged. No pericardial effusion. Hepatobiliary: Liver margin is irregular in the liver appears shrunken. Cholecystectomy. No biliary ductal dilatation. Pancreas: Low-attenuation lesions in the tail of the pancreas measure up to 12 mm, similar to 07/04/2016. No ductal dilatation or gland atrophy. Spleen: Negative. Adrenals/Urinary Tract: Right adrenal gland is unremarkable. Thickening of the body of the left adrenal gland. Subcentimeter low-attenuation lesions in the kidneys are too small to characterize. A hyperdense lesion of the posterior interpolar left kidney measures 1.6 cm, characterized as a hemorrhagic cyst on 07/04/2016. Ureters are decompressed. Prostate indents the bladder. A diverticulum is seen off the right lateral wall of the bladder. Stomach/Bowel: Stomach, small bowel, appendix and colon are unremarkable. Vascular/Lymphatic: Atherosclerotic calcification of the arterial vasculature without abdominal aortic aneurysm. No pathologically enlarged lymph nodes. Reproductive: Prostate is enlarged slightly. Other: No free fluid.  Mesenteries and peritoneum are unremarkable. Musculoskeletal: Degenerative changes in the spine and hips. No worrisome lytic or sclerotic lesions. Old rib fractures. IMPRESSION: 1. No findings to explain the patient's symptoms. 2. Small right pleural effusion with collapse/consolidation in the adjacent right lower lobe. Follow-up to clearing is recommended to exclude malignancy. 3.  Aortic atherosclerosis (ICD10-170.0). 4. Enlarged pulmonary arteries, indicative of pulmonary arterial hypertension. 5. Cirrhosis. 6. Low-attenuation lesions in the tail the pancreas are grossly stable  from 07/04/2016 and may represent pseudocysts. Cystic pancreatic neoplasm cannot be excluded. Follow-up imaging in 2 years is recommended to ensure stability. This recommendation follows ACR consensus guidelines: Management of Incidental Pancreatic Cysts: A White Paper of the ACR Incidental Findings Committee. J Am Coll Radiol  [id number]. 7. Enlarged prostate. Electronically Signed   By: Lorin Picket M.D.   On: 01/02/2018 14:22   Dg Abdomen Acute W/chest  Result Date: 01/01/2018 CLINICAL DATA:  Constipation, weakness, cough EXAM: DG ABDOMEN ACUTE W/ 1V CHEST COMPARISON:  11/10/2016 FINDINGS: Small right pleural effusion. Associated right lower lobe opacity, likely atelectasis. Left lung is clear. No pneumothorax. Stable right rib fractures, likely involving the right posterolateral 3rd through 7th ribs. Nonobstructive bowel gas pattern. No evidence of free air under the diaphragm on the upright view. Cholecystectomy clips. Degenerative changes of the thoracolumbar spine. IMPRESSION: Small right pleural effusion. Associated right lower lobe opacity, likely atelectasis. Stable right rib fractures.  No pneumothorax. No evidence of small bowel obstruction or free air. Electronically Signed   By: Julian Hy M.D.   On: 01/01/2018 23:48     01/15/18 I have reviewed the patient's hospital records.  Since I last saw the patient, he has lost approximately 6 pounds.  He states that he is lost 10 pounds and he is gained for back since leaving the hospital.  His wife states that he is not eating well.  She states that he would just pick at his food and eat very little.  He states that he has trouble swallowing the food and that is why he does not eat very well.  However he states that this is been going on for years and that there has been no sudden change.  He denies any chest pain.  He denies any shortness of breath.  He is still taking 80 mg of Lasix twice a day.  He does this the majority the time.   Occasionally he will reduce his evening dose to 40 mg a day if he feels that he is losing too much weight or if his swelling is down substantially to avoid dehydration.  This current strategy has worked well for him.  His creatinine during his hospitalization was mildly elevated at 1.6.  The patient's baseline is anywhere from 1.4-1.7.  I did review the CT scan which showed stable pancreatic cyst.  They recommended a repeat CT scan in 2 years to ensure stability.  We discussed this today with the patient he is 85 years old.  I question the utility of checking another CT scan when he is 57 given the fact he would be unable to tolerate surgery if they were cancerous.  Therefore we will discuss this in the future whether or not he wants to repeat imaging in over a year.  It also showed stable rib fractures as well as cirrhosis.  Otherwise there were no acute findings.  Patient states he feels better and is starting to get his strength back. At that time, my plan was:  Patient has no peripheral edema on exam today.  He appears to be euvolemic.  This is the lowest his weight has been since I have seen him.  Therefore I believe he is suffering from protein calorie malnutrition.  I have recommended that he add a can of Ensure every day to supplement his oral intake I will check his INR today.  Goal INR is between 2 and 2.5 for this patient.  His heart rate is well controlled.  There is no evidence of fluid overload.  Monitor a CBC as well as a CMP to monitor his potassium and renal function.  I will continue Lasix at 80 mg twice a day and have encouraged the patient to monitor his weight daily and adjust his  Lasix as he has been doing if he loses weight or if he appears dehydrated.  Recheck the patient in 6 weeks or sooner if needed  05/18/18 Wt Readings from Last 3 Encounters:  09/14/18 209 lb (94.8 kg)  09/11/18 208 lb (94.3 kg)  09/10/18 208 lb (94.3 kg)   Patient has gained 9 pounds according to my scales  from the last time he was here.   However at that time, the patient was unable to eat due to dysphagia.  Patient underwent GI work-up.  His dysphagia has improved.  Therefore he is eating much better.  Although gaining weight, there is no obvious edema in his legs.  He has trace bipedal edema which is at his baseline.  His lungs are clear to auscultation bilaterally and he denies any shortness of breath or dyspnea on exertion beyond his baseline.  He is overdue to check an INR today.  However he has 2 primary concerns first he is requesting a power scooter.  Patient has a difficult time walking.  Walking 25 to 30 feet causes him shortness of breath even on his best days.  He frequently has to stop and rest just to catch his breath due to his underlying heart issues.  He also has progressive weakness in his legs.  He walks with a cane however he states that he has fallen on several occasions at home due to poor balance and weakness in his legs.  Timed stand up and go test is performed today.  It takes the patient approximately 10 seconds to stand from a chair.  This can only be accomplished by pushing on the handlebars and pulling himself up on a cabinet corner.  His house can accommodate a power scooter or power wheelchair.  However his arms are too weak to operate a manual wheelchair.  Patient recently felt a pop in his left shoulder.  He is now unable to abduct his left shoulder greater than 80 degrees without significant pain.  He has tremendous weakness with shoulder abduction raising concern for possible rotator cuff tear.  He also has pain with range of motion in his right shoulder.  Due to the pain in his shoulders with range of motion, he has a difficult time propelling himself in a wheelchair.  He believes that if he had a power scooter, he would be able to perform ADLs around his home more easily such as going to the bathroom, bathing, grooming.  It would also facilitate his mobility outside the home and  allow him freedom to go more places and a better quality of life as right now he is essentially confined to his home in his vehicle.  At that time, my plan was:  I believe it is medically appropriate and necessary for the patient have a power mobility device such as a power scooter.  Patient is only able to walk 20 to 30 feet with great difficulty and increased risk of falls due to age, deconditioning, congestive heart failure, and poor balance.  A power mobility device would facilitate his ability to perform ADLs around the home and also allow greater mobility outside the home.  His home can accommodate a power scooter.  He states that the doorways are wide enough to accommodate said device.  Therefore I have instructed the patient to contact a DME and have them fax [place] the requisite forms and I will be glad to complete them on his behalf.  Regarding his atrial fibrillation.  He is overdue  to check an INR.  I reminded the patient that he needs to check this every 4 to 6 weeks.  INR today is within therapeutic limits.  I believe the patient has likely suffered a rotator cuff strain versus partial tear in his left shoulder.  I would not recommend surgery given his age and comorbidities.  Therefore I have recommended tincture of time.  Recheck in 1 week.  If pain persist, we could try therapeutic cortisone injection for comfort.  Consider physical therapy if patient strength is not improving his left shoulder.  Regarding his weight gain, I believe this is most likely due to to increased appetite and better oral intake now that his dysphagia has improved.  Even the patient states that "I am eating now Dr."  Based on his exam I see no reason to increase his diuretic.  08/07/2018 Patient has not been seen since October.  He is long overdue for a Coumadin check.  He presents today with several complaints.  Wife states that he is not eating very well.  He has very little appetite.  However his weight is only down 3  pounds from October.  He complains of pain in his right elbow.  The olecranon bursa on the right elbow is swollen.  It is not erythematous.  It is not warm.  It appears to be bursitis.  However he also reports spells.  He states that every month he will have an episode.  Usually occurs when he is getting out of a car or walking from the car to the house where his vision will turn blue.  He will have to simply stand there and hold on his if he is about to pass out.  Symptoms last a minute or so and then will resolve spontaneously.  Sounds like near syncope versus amaurosis fugax.  However history is very difficult.  At that time, my plan was: The spells that the patient describes sound like orthostatic drops in his blood pressure causing near syncope.  However given his extensive cardiac history, would recommend cardiology consultation for possible cardiac monitor to rule out cardiac arrhythmia.  Will monitor for any electrolyte abnormalities with a CMP and a CBC.  INR is checked today and is therapeutic.  Will make no changes in his Coumadin dosage.  I recommended that we defer treatment of the olecranon bursitis for the time being.  Is no evidence of infection and it is only mildly tender.  Should gradually improve with time  09/10/18 I reviewed the patient's recent visit with his cardiologist.  Since that time, his legs have started to swell dramatically.  He has +2 pitting edema in his right leg up to his knee.  He is unable to wear his compression hose due to the swelling.  He also has +2 pitting edema in his left leg up to his knee.  His legs are so swollen that they hurt.  He also reports dyspnea with exertion which is mild.  He denies any chest pain.  He denies any syncope.  He denies any lightheadedness.  His blood pressure today however is extremely soft at 88/60.  I confirmed this personally.  However he has bibasilar crackles in both lungs dusting fluid overload despite his weight essentially remaining  stable.  At that time, my plan was: Patient's exam today is markedly different than his exam in early January.  He now has pitting edema and rales on pulmonary exam.  He appears fluid overloaded today.  Furthermore his  blood pressure is low.  We discussed going to the hospital for diuresis.  The patient feels fine other than pain in his legs due to the swelling and mild shortness of breath with activity.  He wants to avoid the hospital due to the exposure to flu and other illnesses which I agree would likely kill him if he contracted a secondary infection.  Therefore we will discontinue metoprolol to avoid hypotension after diuresis and increase his Lasix to 3 pills twice daily.  I will see the patient back first thing tomorrow.  Consider using compression Unna boots tomorrow if necessary to help control the swelling.  Explained to the patient and his wife that I am concerned he may be showing worsening signs of congestive heart failure and that this could be a sign that his condition is becoming more serious and potentially life-threatening.  Patient understands and elects to go home.  He will follow-up with me tomorrow and we will attempt outpatient diuresis.  Consider hospice referral if the patient continues to deteriorate  09/11/18 Patient's blood pressure is now improved discontinue metoprolol.  However despite increasing Lasix there has not been substantial improvement in the swelling in his legs.  I believe the patient is more third spacing then fluid overloaded.  Because his legs are swollen so bad he is unable to wear his compression hose and therefore the third spacing and pitting edema in his legs continues to worsen.  He denies any dizziness or syncope or chest pain or shortness of breath.  At that time, my plan was: Continue Lasix 3 tablets twice daily however I placed the patient in bilateral Unna boots today in an effort to help with the third spacing to drive the pitting edema from the  interstitial tissues back into the blood vessels where the diuretics can be more effective.  We will see the patient back on Monday to remove the Unna boots and at that time we will switch him back to his compression hose if the swelling has improved.  Resume previous dose of Lasix, 2 pills twice a day on Sunday to avoid dehydration.  If swelling is better Monday, will resume the metoprolol at that time after we have decreased his Lasix  09/14/18 Patient is here today to follow-up.  I removed his Unna boots.  The swelling had improved dramatically in both feet and is down to normal now.  Patient brings in a brand-new pair of compression hose for me to apply to his legs today.  I was able to put the patient back into his compression support hose today after I remove the Unna boots.  His blood pressure is also better and now appears stable to resume metoprolol especially given that he is no longer taking the elevated dose of Lasix.  At that time, my plan was: I remove the patient's Unna boots.  He was placed in compression hose bilaterally.  Continue to wear compression hose on a daily basis to control swelling.  Resume previous dose of Lasix 80 mg twice daily.Marland Kitchen  Resume metoprolol 12.5 mg p.o. twice daily  10/08/18 Wt Readings from Last 3 Encounters:  10/08/18 212 lb (96.2 kg)  09/14/18 209 lb (94.8 kg)  09/11/18 208 lb (94.3 kg)   Patient presents today with swelling in both legs right greater than left.  He has +2 pitting edema in his right leg and +1 pitting edema in his left leg up to his knee.  Patient denies any chest pain.  He has  chronic shortness of breath is no different from his baseline.  Weight is increased 4 pounds since 1 month ago.  He is taking lasix 120 mg in the morning and 80 mg in the evening and has not achieved any successful diuresis.  He has stopped wearing compression hose due to the pain in his legs.  Got the compression hose the day after I last saw him  Past Medical History:    Diagnosis Date  . Atelectasis of right lung   . Chronic diastolic (congestive) heart failure ([place])    a. 10/2013 Echo: EF 55-60%, basal-mid anteroseptal and basal-mid inferoseptal HK; b. 02/2017 Echo: EF 60-65%, Gr3 DD.  Marland Kitchen Chronic fatigue   . Chronic subdural hematoma ([place])    a. 12/2015 Head CT: chronic right holo-hemispheric SDH w/o midline shift.  . CKD (chronic kidney disease), stage III ([place])   . Coronary artery disease    a. 11/2014 NSTEMI: DESx 2 placed to LAD and RCA; b. 08/2015 Cath: LAD patent stent, LCX 62m, RCA patent stent, 68m.  . Diabetes mellitus, type II ([place])    a. HgA1c 6.8 in 03/2015  . ED (erectile dysfunction)   . Gout   . Hemangioma of liver 08/02/2015   12 mm enhancing lesion seen on CT - suspicious for benign hemangioma but not diagnostic - MRI recommended  . HLD (hyperlipidemia)   . Hypertension   . Hypertensive heart disease   . Ischemic finger ulcer ([place])    a. 03/2017 Seen for digital ulcers - felt to be embolic in setting of PAF. Pt on coumadin.  . Kidney stones   . Lower extremity edema    a. chronic  . OA (osteoarthritis)   . Orthostatic hypotension   . PAF (paroxysmal atrial fibrillation) ([place])    a. 10/2015 s/p DCCV;  b. CHA2DS2VASc = 6--> on coumadin; c. 10/2015 Echo: sev dil LA.  . Prostate cancer ([place])   . Pulmonary HTN ([place])   . Pulmonary hypertension ([place])    a. 10/2015 Echo: PASP 64mmHg.  Marland Kitchen Renal insufficiency   . Severe mitral regurgitation    a.  10/2015 s/p minimally invasive MV repair; b. 10/2015 Echo: mod MS, mean grad 30mmHg; c. 02/2017 Echo: EF 60-65%, Gr3 DD, mod Ca2+ MV annulus, mod to sev dil LA, mod dil RA, mod TR. PASP 84mmHg.  Marland Kitchen Spinal stenosis   . SVT (supraventricular tachycardia) ([facility])    a. recurrent, usually responsive to vagal manuevers  . Tricuspid regurgitation    a. 10/2015 Echo: mild to mod TR.   Past Surgical History:  Procedure Laterality Date  . BACK SURGERY    . Back surgery x 2    . CARDIAC CATHETERIZATION N/A  08/08/2015   Procedure: Right/Left Heart Cath and Coronary Angiography;  Surgeon: Sherren Mocha, MD;  Location: [facility];  Service: Cardiovascular;  Laterality: N/A;  . CARDIAC SURGERY     2 cardiac stents  . CARDIOVERSION N/A 10/20/2015   Procedure: CARDIOVERSION;  Surgeon: Pixie Casino, MD;  Location: [place];  Service: Cardiovascular;  Laterality: N/A;  . CERVICAL SPINE SURGERY    . CHOLECYSTECTOMY N/A 07/08/2016   Procedure: LAPAROSCOPIC CHOLECYSTECTOMY WITH INTRAOPERATIVE CHOLANGIOGRAM;  Surgeon: Georganna Skeans, MD;  Location: [place];  Service: General;  Laterality: N/A;  . CORONARY ARTERY BYPASS GRAFT    . ESOPHAGOGASTRODUODENOSCOPY (EGD) WITH PROPOFOL N/A 03/19/2018   Procedure: ESOPHAGOGASTRODUODENOSCOPY (EGD) WITH PROPOFOL;  Surgeon: [name], MD;  Location: [facility] ENDOSCOPY;  Service: Gastroenterology;  Laterality:  N/A;  . EYE SURGERY    . INTRAOPERATIVE TRANSESOPHAGEAL ECHOCARDIOGRAM  10/05/2015   Procedure: INTRAOPERATIVE TRANSESOPHAGEAL ECHOCARDIOGRAM;  Surgeon: Rexene Alberts, MD;  Location: [facility];  Service: Open Heart Surgery;;  . JOINT REPLACEMENT     shoulder  . LEFT HEART CATHETERIZATION WITH CORONARY ANGIOGRAM N/A 11/16/2014   Procedure: LEFT HEART CATHETERIZATION WITH CORONARY ANGIOGRAM;  Surgeon: Troy Sine, MD;  Location: [facility] CATH LAB;  Service: Cardiovascular;  Laterality: N/A;  . MITRAL VALVE REPAIR Right 10/04/2015   Procedure: MINIMALLY INVASIVE MITRAL VALVE REPAIR (MVR);  Surgeon: Rexene Alberts, MD;  Location: [place];  Service: Open Heart Surgery;  Laterality: Right;  . Rt rotator cuff repair    . TEE WITHOUT CARDIOVERSION N/A 06/22/2015   Procedure: TRANSESOPHAGEAL ECHOCARDIOGRAM (TEE);  Surgeon: Skeet Latch, MD;  Location: [place];  Service: Cardiovascular;  Laterality: N/A;  . TEE WITHOUT CARDIOVERSION N/A 10/04/2015   Procedure: TRANSESOPHAGEAL ECHOCARDIOGRAM (TEE);  Surgeon: Rexene Alberts, MD;  Location: [place];  Service: Open Heart  Surgery;  Laterality: N/A;  . WOUND EXPLORATION N/A 10/05/2015   Procedure: Sternal Incision;  Surgeon: Rexene Alberts, MD;  Location: [place];  Service: Open Heart Surgery;  Laterality: N/A;   Current Outpatient Medications on File Prior to Visit  Medication Sig Dispense Refill  . allopurinol (ZYLOPRIM) 100 MG tablet TAKE ONE (1) TABLET EACH DAY 90 tablet 2  . cyclobenzaprine (FLEXERIL) 5 MG tablet Take 1 tablet (5 mg total) by mouth 3 (three) times daily as needed for muscle spasms. 30 tablet 0  . docusate sodium (COLACE) 100 MG capsule Take 1 capsule (100 mg total) by mouth daily as needed for moderate constipation. 60 capsule 2  . finasteride (PROSCAR) 5 MG tablet Take 5 mg by mouth at bedtime.    . furosemide (LASIX) 40 MG tablet Take 2 tablets (80 mg total) by mouth 2 (two) times daily. 120 tablet 2  . loratadine (CLARITIN) 10 MG tablet Take 1 tablet (10 mg total) by mouth daily as needed for allergies. 30 tablet 3  . metoprolol tartrate (LOPRESSOR) 25 MG tablet Take 0.5 tablets (12.5 mg total) by mouth 2 (two) times daily. 90 tablet 3  . OVER THE COUNTER MEDICATION Neuroqu[place]: Take 1 capsule by mouth once a day for immune health    . potassium chloride (KLOR-CON) 20 MEQ packet Take 40 mEq by mouth daily. 60 packet 0  . pravastatin (PRAVACHOL) 40 MG tablet TAKE 1 TABLET BY MOUTH EVERY DAY AT 6PM 30 tablet 9  . spironolactone (ALDACTONE) 25 MG tablet Take 1 tablet (25 mg total) by mouth daily. 90 tablet 3  . traMADol (ULTRAM) 50 MG tablet TAKE ONE TABLET BY MOUTH EVERY 6 HOURS AS NEEDED FOR MODERATE PAIN 30 tablet 0  . triamcinolone cream (KENALOG) 0.1 % Apply 1 application topically 2 (two) times daily. 30 g 0  . warfarin (COUMADIN) 4 MG tablet TAKE 1 TABLET(4 MG TOTAL) BY MOUTH DAILY. DO NOT TAKE WITH ANY OTHER WARFARIN UNLESS DISCARD REMAINDER. Katharyn Schauer INSTRUCTS. 30 tablet 3   No current facility-administered medications on file prior to visit.    No Known Allergies Social History    Socioeconomic History  . Marital status: Married    Spouse name: Not on file  . Number of children: 4  . Years of education: Not on file  . Highest education level: Not on file  Occupational History    Employer: RETIRED  Social Needs  . Financial resource strain: Not  on file  . Food insecurity:    Worry: Not on file    Inability: Not on file  . Transportation needs:    Medical: Not on file    Non-medical: Not on file  Tobacco Use  . Smoking status: Former Smoker    Types: Cigarettes, Pipe, Landscape architect  . Smokeless tobacco: Former Systems developer    Types: Chew  . Tobacco comment: QUIT SMOKING  MANY YEARS AGO"  Substance and Sexual Activity  . Alcohol use: No  . Drug use: No  . Sexual activity: Not on file  Lifestyle  . Physical activity:    Days per week: Not on file    Minutes per session: Not on file  . Stress: Not on file  Relationships  . Social connections:    Talks on phone: Not on file    Gets together: Not on file    Attends religious service: Not on file    Active member of club or organization: Not on file    Attends meetings of clubs or organizations: Not on file    Relationship status: Not on file  . Intimate partner violence:    Fear of current or ex partner: Not on file    Emotionally abused: Not on file    Physically abused: Not on file    Forced sexual activity: Not on file  Other Topics Concern  . Not on file  Social History Narrative   ** Merged History Encounter **       Four adopted children.  Lives alone.       Review of Systems  All other systems reviewed and are negative.      Objective:   Physical Exam  Constitutional: He appears well-developed and well-nourished. No distress.  Cardiovascular: Normal rate. An irregularly irregular rhythm present.  Murmur heard. Pulmonary/Chest: Effort normal. No respiratory distress. He has no wheezes. He has no rhonchi. He has no rales.  Abdominal: Soft. Bowel sounds are normal. He exhibits no distension.  There is no abdominal tenderness. There is no rebound.  Musculoskeletal:        General: Edema present.  Skin: He is not diaphoretic.  Vitals reviewed.  He has poor balance and walks with a cane.     Assessment & Plan:  Leg swelling  Discontinue Lasix and switch to Bumex 2 mg p.o. twice daily.  Recheck via telephone tomorrow.  Will increase to 4 mg p.o. twice daily if the patient has not experienced any diuresis and will recheck the patient on Monday or sooner if worse.  Continue current potassium dose

## 2018-10-09 ENCOUNTER — Telehealth: Payer: Self-pay | Admitting: Family Medicine

## 2018-10-09 NOTE — Telephone Encounter (Signed)
Pt aware via vm 

## 2018-10-09 NOTE — Telephone Encounter (Signed)
Continue same dose. Recheck monday

## 2018-10-09 NOTE — Telephone Encounter (Signed)
pts leg feels better no pain.

## 2018-10-12 ENCOUNTER — Encounter: Payer: Self-pay | Admitting: Family Medicine

## 2018-10-12 ENCOUNTER — Ambulatory Visit (INDEPENDENT_AMBULATORY_CARE_PROVIDER_SITE_OTHER): Payer: Medicare Other | Admitting: Family Medicine

## 2018-10-12 VITALS — BP 106/68 | HR 88 | Temp 97.8°F | Resp 16 | Ht 67.0 in | Wt 206.0 lb

## 2018-10-12 DIAGNOSIS — I4819 Other persistent atrial fibrillation: Secondary | ICD-10-CM | POA: Diagnosis not present

## 2018-10-12 DIAGNOSIS — M7989 Other specified soft tissue disorders: Secondary | ICD-10-CM | POA: Diagnosis not present

## 2018-10-12 LAB — PT WITH INR/FINGERSTICK
INR, fingerstick: 2.6 ratio — ABNORMAL HIGH
PT, fingerstick: 31.7 s — ABNORMAL HIGH (ref 10.5–13.1)

## 2018-10-12 NOTE — Progress Notes (Addendum)
Subjective:    Patient ID: [name], male    DOB: [birth year], 85 y.o.   MRN: [id number]  HPI  01/15/18 Unfortunately was recently admitted.  I have copied the discharge summary below for reference: Admission date: 01/01/2018 Discharge Date 01/03/2018 Primary MD [name], MD Admitting Physician [name], MD  Admission Diagnosis  Elevated troponin [R74.8] AKI (acute kidney injury) ([place]) [N17.9] Constipation, unspecified constipation type [K59.00]  Discharge Diagnosis   Active Problems: Acute on chronic kidney injury Elevated troponin due to demand ischemia Pancreatitis Recurrent Hepatitis Constipation  Hospital Course  The patient with extensive past medical history most significant for coronary artery disease status post MI and and stent placement, chronic diastolic heart failure as well as significant mitral and tricuspid regurg that is post minimally invasive repair, and chronic subdural hematoma presents to the emergency department complaining of constipation and weakness. Patient had CT abdomen which showed pancreatic cyst pancreatic cyst but no other abnormality.  Patient's abdominal pain resolved after bowel movements.  He is very anxious to go home.  Patient also was noted to have elevated troponin felt to be due to demand ischemia I recommended he follow-up with his cardiologist.   ImagingResults  Ct Abdomen Pelvis W Wo Contrast  Result Date: 01/02/2018 CLINICAL DATA:  Unintended weight loss, abdominal pain. EXAM: CT ABDOMEN AND PELVIS WITHOUT AND WITH CONTRAST TECHNIQUE: Multidetector CT imaging of the abdomen and pelvis was performed following the standard protocol before and following the bolus administration of intravenous contrast. CONTRAST:  52mL ISOVUE-300 IOPAMIDOL (ISOVUE-300) INJECTION 61% COMPARISON:  MR abdomen 07/04/2016 and CT abdomen pelvis 08/02/2015. FINDINGS: Lower chest: Small right pleural effusion with collapse/consolidation in  the adjacent right lower lobe. Pleural calcifications on the right with calcified granulomas. Image quality is degraded by respiratory motion. Heart is moderately enlarged. Atherosclerotic calcification of the arterial vasculature, including coronary arteries and aortic bowel. Pulmonary arteries are enlarged. No pericardial effusion. Hepatobiliary: Liver margin is irregular in the liver appears shrunken. Cholecystectomy. No biliary ductal dilatation. Pancreas: Low-attenuation lesions in the tail of the pancreas measure up to 12 mm, similar to 07/04/2016. No ductal dilatation or gland atrophy. Spleen: Negative. Adrenals/Urinary Tract: Right adrenal gland is unremarkable. Thickening of the body of the left adrenal gland. Subcentimeter low-attenuation lesions in the kidneys are too small to characterize. A hyperdense lesion of the posterior interpolar left kidney measures 1.6 cm, characterized as a hemorrhagic cyst on 07/04/2016. Ureters are decompressed. Prostate indents the bladder. A diverticulum is seen off the right lateral wall of the bladder. Stomach/Bowel: Stomach, small bowel, appendix and colon are unremarkable. Vascular/Lymphatic: Atherosclerotic calcification of the arterial vasculature without abdominal aortic aneurysm. No pathologically enlarged lymph nodes. Reproductive: Prostate is enlarged slightly. Other: No free fluid.  Mesenteries and peritoneum are unremarkable. Musculoskeletal: Degenerative changes in the spine and hips. No worrisome lytic or sclerotic lesions. Old rib fractures. IMPRESSION: 1. No findings to explain the patient's symptoms. 2. Small right pleural effusion with collapse/consolidation in the adjacent right lower lobe. Follow-up to clearing is recommended to exclude malignancy. 3.  Aortic atherosclerosis (ICD10-170.0). 4. Enlarged pulmonary arteries, indicative of pulmonary arterial hypertension. 5. Cirrhosis. 6. Low-attenuation lesions in the tail the pancreas are grossly stable  from 07/04/2016 and may represent pseudocysts. Cystic pancreatic neoplasm cannot be excluded. Follow-up imaging in 2 years is recommended to ensure stability. This recommendation follows ACR consensus guidelines: Management of Incidental Pancreatic Cysts: A White Paper of the ACR Incidental Findings Committee. J Am Coll Radiol  [id number]. 7. Enlarged prostate. Electronically Signed   By: Lorin Picket M.D.   On: 01/02/2018 14:22   Dg Abdomen Acute W/chest  Result Date: 01/01/2018 CLINICAL DATA:  Constipation, weakness, cough EXAM: DG ABDOMEN ACUTE W/ 1V CHEST COMPARISON:  11/10/2016 FINDINGS: Small right pleural effusion. Associated right lower lobe opacity, likely atelectasis. Left lung is clear. No pneumothorax. Stable right rib fractures, likely involving the right posterolateral 3rd through 7th ribs. Nonobstructive bowel gas pattern. No evidence of free air under the diaphragm on the upright view. Cholecystectomy clips. Degenerative changes of the thoracolumbar spine. IMPRESSION: Small right pleural effusion. Associated right lower lobe opacity, likely atelectasis. Stable right rib fractures.  No pneumothorax. No evidence of small bowel obstruction or free air. Electronically Signed   By: Julian Hy M.D.   On: 01/01/2018 23:48     01/15/18 I have reviewed the patient's hospital records.  Since I last saw the patient, he has lost approximately 6 pounds.  He states that he is lost 10 pounds and he is gained for back since leaving the hospital.  His wife states that he is not eating well.  She states that he would just pick at his food and eat very little.  He states that he has trouble swallowing the food and that is why he does not eat very well.  However he states that this is been going on for years and that there has been no sudden change.  He denies any chest pain.  He denies any shortness of breath.  He is still taking 80 mg of Lasix twice a day.  He does this the majority the time.   Occasionally he will reduce his evening dose to 40 mg a day if he feels that he is losing too much weight or if his swelling is down substantially to avoid dehydration.  This current strategy has worked well for him.  His creatinine during his hospitalization was mildly elevated at 1.6.  The patient's baseline is anywhere from 1.4-1.7.  I did review the CT scan which showed stable pancreatic cyst.  They recommended a repeat CT scan in 2 years to ensure stability.  We discussed this today with the patient he is 85 years old.  I question the utility of checking another CT scan when he is 13 given the fact he would be unable to tolerate surgery if they were cancerous.  Therefore we will discuss this in the future whether or not he wants to repeat imaging in over a year.  It also showed stable rib fractures as well as cirrhosis.  Otherwise there were no acute findings.  Patient states he feels better and is starting to get his strength back. At that time, my plan was:  Patient has no peripheral edema on exam today.  He appears to be euvolemic.  This is the lowest his weight has been since I have seen him.  Therefore I believe he is suffering from protein calorie malnutrition.  I have recommended that he add a can of Ensure every day to supplement his oral intake I will check his INR today.  Goal INR is between 2 and 2.5 for this patient.  His heart rate is well controlled.  There is no evidence of fluid overload.  Monitor a CBC as well as a CMP to monitor his potassium and renal function.  I will continue Lasix at 80 mg twice a day and have encouraged the patient to monitor his weight daily and adjust his  Lasix as he has been doing if he loses weight or if he appears dehydrated.  Recheck the patient in 6 weeks or sooner if needed  05/18/18 Wt Readings from Last 3 Encounters:  10/12/18 206 lb (93.4 kg)  10/08/18 212 lb (96.2 kg)  09/14/18 209 lb (94.8 kg)   Patient has gained 9 pounds according to my scales  from the last time he was here.   However at that time, the patient was unable to eat due to dysphagia.  Patient underwent GI work-up.  His dysphagia has improved.  Therefore he is eating much better.  Although gaining weight, there is no obvious edema in his legs.  He has trace bipedal edema which is at his baseline.  His lungs are clear to auscultation bilaterally and he denies any shortness of breath or dyspnea on exertion beyond his baseline.  He is overdue to check an INR today.  However he has 2 primary concerns first he is requesting a power scooter.  Patient has a difficult time walking.  Walking 25 to 30 feet causes him shortness of breath even on his best days.  He frequently has to stop and rest just to catch his breath due to his underlying heart issues.  He also has progressive weakness in his legs.  He walks with a cane however he states that he has fallen on several occasions at home due to poor balance and weakness in his legs.  Timed stand up and go test is performed today.  It takes the patient approximately 10 seconds to stand from a chair.  This can only be accomplished by pushing on the handlebars and pulling himself up on a cabinet corner.  His house can accommodate a power scooter or power wheelchair.  However his arms are too weak to operate a manual wheelchair.  Patient recently felt a pop in his left shoulder.  He is now unable to abduct his left shoulder greater than 80 degrees without significant pain.  He has tremendous weakness with shoulder abduction raising concern for possible rotator cuff tear.  He also has pain with range of motion in his right shoulder.  Due to the pain in his shoulders with range of motion, he has a difficult time propelling himself in a wheelchair.  He believes that if he had a power scooter, he would be able to perform ADLs around his home more easily such as going to the bathroom, bathing, grooming.  It would also facilitate his mobility outside the home and  allow him freedom to go more places and a better quality of life as right now he is essentially confined to his home in his vehicle.  At that time, my plan was:  I believe it is medically appropriate and necessary for the patient have a power mobility device such as a power scooter.  Patient is only able to walk 20 to 30 feet with great difficulty and increased risk of falls due to age, deconditioning, congestive heart failure, and poor balance.  A power mobility device would facilitate his ability to perform ADLs around the home and also allow greater mobility outside the home.  His home can accommodate a power scooter.  He states that the doorways are wide enough to accommodate said device.  Therefore I have instructed the patient to contact a DME and have them fax [place] the requisite forms and I will be glad to complete them on his behalf.  Regarding his atrial fibrillation.  He is overdue  to check an INR.  I reminded the patient that he needs to check this every 4 to 6 weeks.  INR today is within therapeutic limits.  I believe the patient has likely suffered a rotator cuff strain versus partial tear in his left shoulder.  I would not recommend surgery given his age and comorbidities.  Therefore I have recommended tincture of time.  Recheck in 1 week.  If pain persist, we could try therapeutic cortisone injection for comfort.  Consider physical therapy if patient strength is not improving his left shoulder.  Regarding his weight gain, I believe this is most likely due to to increased appetite and better oral intake now that his dysphagia has improved.  Even the patient states that "I am eating now Dr."  Based on his exam I see no reason to increase his diuretic.  08/07/2018 Patient has not been seen since October.  He is long overdue for a Coumadin check.  He presents today with several complaints.  Wife states that he is not eating very well.  He has very little appetite.  However his weight is only down 3  pounds from October.  He complains of pain in his right elbow.  The olecranon bursa on the right elbow is swollen.  It is not erythematous.  It is not warm.  It appears to be bursitis.  However he also reports spells.  He states that every month he will have an episode.  Usually occurs when he is getting out of a car or walking from the car to the house where his vision will turn blue.  He will have to simply stand there and hold on his if he is about to pass out.  Symptoms last a minute or so and then will resolve spontaneously.  Sounds like near syncope versus amaurosis fugax.  However history is very difficult.  At that time, my plan was: The spells that the patient describes sound like orthostatic drops in his blood pressure causing near syncope.  However given his extensive cardiac history, would recommend cardiology consultation for possible cardiac monitor to rule out cardiac arrhythmia.  Will monitor for any electrolyte abnormalities with a CMP and a CBC.  INR is checked today and is therapeutic.  Will make no changes in his Coumadin dosage.  I recommended that we defer treatment of the olecranon bursitis for the time being.  Is no evidence of infection and it is only mildly tender.  Should gradually improve with time  09/10/18 I reviewed the patient's recent visit with his cardiologist.  Since that time, his legs have started to swell dramatically.  He has +2 pitting edema in his right leg up to his knee.  He is unable to wear his compression hose due to the swelling.  He also has +2 pitting edema in his left leg up to his knee.  His legs are so swollen that they hurt.  He also reports dyspnea with exertion which is mild.  He denies any chest pain.  He denies any syncope.  He denies any lightheadedness.  His blood pressure today however is extremely soft at 88/60.  I confirmed this personally.  However he has bibasilar crackles in both lungs dusting fluid overload despite his weight essentially remaining  stable.  At that time, my plan was: Patient's exam today is markedly different than his exam in early January.  He now has pitting edema and rales on pulmonary exam.  He appears fluid overloaded today.  Furthermore his  blood pressure is low.  We discussed going to the hospital for diuresis.  The patient feels fine other than pain in his legs due to the swelling and mild shortness of breath with activity.  He wants to avoid the hospital due to the exposure to flu and other illnesses which I agree would likely kill him if he contracted a secondary infection.  Therefore we will discontinue metoprolol to avoid hypotension after diuresis and increase his Lasix to 3 pills twice daily.  I will see the patient back first thing tomorrow.  Consider using compression Unna boots tomorrow if necessary to help control the swelling.  Explained to the patient and his wife that I am concerned he may be showing worsening signs of congestive heart failure and that this could be a sign that his condition is becoming more serious and potentially life-threatening.  Patient understands and elects to go home.  He will follow-up with me tomorrow and we will attempt outpatient diuresis.  Consider hospice referral if the patient continues to deteriorate  09/11/18 Patient's blood pressure is now improved discontinue metoprolol.  However despite increasing Lasix there has not been substantial improvement in the swelling in his legs.  I believe the patient is more third spacing then fluid overloaded.  Because his legs are swollen so bad he is unable to wear his compression hose and therefore the third spacing and pitting edema in his legs continues to worsen.  He denies any dizziness or syncope or chest pain or shortness of breath.  At that time, my plan was: Continue Lasix 3 tablets twice daily however I placed the patient in bilateral Unna boots today in an effort to help with the third spacing to drive the pitting edema from the  interstitial tissues back into the blood vessels where the diuretics can be more effective.  We will see the patient back on Monday to remove the Unna boots and at that time we will switch him back to his compression hose if the swelling has improved.  Resume previous dose of Lasix, 2 pills twice a day on Sunday to avoid dehydration.  If swelling is better Monday, will resume the metoprolol at that time after we have decreased his Lasix  09/14/18 Patient is here today to follow-up.  I removed his Unna boots.  The swelling had improved dramatically in both feet and is down to normal now.  Patient brings in a brand-new pair of compression hose for me to apply to his legs today.  I was able to put the patient back into his compression support hose today after I remove the Unna boots.  His blood pressure is also better and now appears stable to resume metoprolol especially given that he is no longer taking the elevated dose of Lasix.  At that time, my plan was: I remove the patient's Unna boots.  He was placed in compression hose bilaterally.  Continue to wear compression hose on a daily basis to control swelling.  Resume previous dose of Lasix 80 mg twice daily.Marland Kitchen  Resume metoprolol 12.5 mg p.o. twice daily  10/08/18 Wt Readings from Last 3 Encounters:  10/12/18 206 lb (93.4 kg)  10/08/18 212 lb (96.2 kg)  09/14/18 209 lb (94.8 kg)   Patient presents today with swelling in both legs right greater than left.  He has +2 pitting edema in his right leg and +1 pitting edema in his left leg up to his knee.  Patient denies any chest pain.  He has  chronic shortness of breath is no different from his baseline.  Weight is increased 4 pounds since 1 month ago.  He is taking lasix 120 mg in the morning and 80 mg in the evening and has not achieved any successful diuresis.  He has stopped wearing compression hose due to the pain in his legs.  Got the compression hose the day after I last saw him.  AT that time, my plan  was: Discontinue Lasix and switch to Bumex 2 mg p.o. twice daily.  Recheck via telephone tomorrow.  Will increase to 4 mg p.o. twice daily if the patient has not experienced any diuresis and will recheck the patient on Monday or sooner if worse.  Continue current potassium dose  10/12/18 Patient is seen no benefit since switching from Lasix to Bumex 2 mg twice daily.  He continues to have significant edema in his right leg greater than his left leg.  It is still tender to palpation in both his right and left leg.  He also has dyspnea on exertion and he has left basilar crackles appreciated on today's exam.  Past Medical History:  Diagnosis Date  . Atelectasis of right lung   . Chronic diastolic (congestive) heart failure ([place])    a. 10/2013 Echo: EF 55-60%, basal-mid anteroseptal and basal-mid inferoseptal HK; b. 02/2017 Echo: EF 60-65%, Gr3 DD.  Marland Kitchen Chronic fatigue   . Chronic subdural hematoma ([place])    a. 12/2015 Head CT: chronic right holo-hemispheric SDH w/o midline shift.  . CKD (chronic kidney disease), stage III ([place])   . Coronary artery disease    a. 11/2014 NSTEMI: DESx 2 placed to LAD and RCA; b. 08/2015 Cath: LAD patent stent, LCX 58m, RCA patent stent, 80m.  . Diabetes mellitus, type II ([place])    a. HgA1c 6.8 in 03/2015  . ED (erectile dysfunction)   . Gout   . Hemangioma of liver 08/02/2015   12 mm enhancing lesion seen on CT - suspicious for benign hemangioma but not diagnostic - MRI recommended  . HLD (hyperlipidemia)   . Hypertension   . Hypertensive heart disease   . Ischemic finger ulcer ([place])    a. 03/2017 Seen for digital ulcers - felt to be embolic in setting of PAF. Pt on coumadin.  . Kidney stones   . Lower extremity edema    a. chronic  . OA (osteoarthritis)   . Orthostatic hypotension   . PAF (paroxysmal atrial fibrillation) ([place])    a. 10/2015 s/p DCCV;  b. CHA2DS2VASc = 6--> on coumadin; c. 10/2015 Echo: sev dil LA.  . Prostate cancer ([place])   . Pulmonary HTN ([place])    . Pulmonary hypertension ([place])    a. 10/2015 Echo: PASP 72mmHg.  Marland Kitchen Renal insufficiency   . Severe mitral regurgitation    a.  10/2015 s/p minimally invasive MV repair; b. 10/2015 Echo: mod MS, mean grad 54mmHg; c. 02/2017 Echo: EF 60-65%, Gr3 DD, mod Ca2+ MV annulus, mod to sev dil LA, mod dil RA, mod TR. PASP 2mmHg.  Marland Kitchen Spinal stenosis   . SVT (supraventricular tachycardia) ([facility])    a. recurrent, usually responsive to vagal manuevers  . Tricuspid regurgitation    a. 10/2015 Echo: mild to mod TR.   Past Surgical History:  Procedure Laterality Date  . BACK SURGERY    . Back surgery x 2    . CARDIAC CATHETERIZATION N/A 08/08/2015   Procedure: Right/Left Heart Cath and Coronary Angiography;  Surgeon: Sherren Mocha, MD;  Location: [facility];  Service: Cardiovascular;  Laterality: N/A;  . CARDIAC SURGERY     2 cardiac stents  . CARDIOVERSION N/A 10/20/2015   Procedure: CARDIOVERSION;  Surgeon: Pixie Casino, MD;  Location: [facility];  Service: Cardiovascular;  Laterality: N/A;  . CERVICAL SPINE SURGERY    . CHOLECYSTECTOMY N/A 07/08/2016   Procedure: LAPAROSCOPIC CHOLECYSTECTOMY WITH INTRAOPERATIVE CHOLANGIOGRAM;  Surgeon: Georganna Skeans, MD;  Location: [facility];  Service: General;  Laterality: N/A;  . CORONARY ARTERY BYPASS GRAFT    . ESOPHAGOGASTRODUODENOSCOPY (EGD) WITH PROPOFOL N/A 03/19/2018   Procedure: ESOPHAGOGASTRODUODENOSCOPY (EGD) WITH PROPOFOL;  Surgeon: [name], MD;  Location[facility] ENDOSCOPY;  Service: Gastroenterology;  Laterality: N/A;  . EYE SURGERY    . INTRAOPERATIVE TRANSESOPHAGEAL ECHOCARDIOGRAM  10/05/2015   Procedure: INTRAOPERATIVE TRANSESOPHAGEAL ECHOCARDIOGRAM;  Surgeon: Rexene Alberts, MD;  Location: [facility] OR;  Service: Open Heart Surgery;;  . JOINT REPLACEMENT     shoulder  . LEFT HEART CATHETERIZATION WITH CORONARY ANGIOGRAM N/A 11/16/2014   Procedure: LEFT HEART CATHETERIZATION WITH CORONARY ANGIOGRAM;  Surgeon: Troy Sine, MD;  Location: [facility] CATH LAB;   Service: Cardiovascular;  Laterality: N/A;  . MITRAL VALVE REPAIR Right 10/04/2015   Procedure: MINIMALLY INVASIVE MITRAL VALVE REPAIR (MVR);  Surgeon: Rexene Alberts, MD;  Location: [facility];  Service: Open Heart Surgery;  Laterality: Right;  . Rt rotator cuff repair    . TEE WITHOUT CARDIOVERSION N/A 06/22/2015   Procedure: TRANSESOPHAGEAL ECHOCARDIOGRAM (TEE);  Surgeon: Skeet Latch, MD;  Location: [facility];  Service: Cardiovascular;  Laterality: N/A;  . TEE WITHOUT CARDIOVERSION N/A 10/04/2015   Procedure: TRANSESOPHAGEAL ECHOCARDIOGRAM (TEE);  Surgeon: Rexene Alberts, MD;  Location: [facility];  Service: Open Heart Surgery;  Laterality: N/A;  . WOUND EXPLORATION N/A 10/05/2015   Procedure: Sternal Incision;  Surgeon: Rexene Alberts, MD;  Location: [facility];  Service: Open Heart Surgery;  Laterality: N/A;   Current Outpatient Medications on File Prior to Visit  Medication Sig Dispense Refill  . allopurinol (ZYLOPRIM) 100 MG tablet TAKE ONE (1) TABLET EACH DAY 90 tablet 2  . bumetanide (BUMEX) 2 MG tablet TAKE 1 TABLET(2 MG) BY MOUTH TWICE DAILY. 180 tablet 0  . cyclobenzaprine (FLEXERIL) 5 MG tablet Take 1 tablet (5 mg total) by mouth 3 (three) times daily as needed for muscle spasms. 30 tablet 0  . docusate sodium (COLACE) 100 MG capsule Take 1 capsule (100 mg total) by mouth daily as needed for moderate constipation. 60 capsule 2  . finasteride (PROSCAR) 5 MG tablet Take 5 mg by mouth at bedtime.    Marland Kitchen loratadine (CLARITIN) 10 MG tablet Take 1 tablet (10 mg total) by mouth daily as needed for allergies. 30 tablet 3  . metoprolol tartrate (LOPRESSOR) 25 MG tablet Take 0.5 tablets (12.5 mg total) by mouth 2 (two) times daily. 90 tablet 3  . OVER THE COUNTER MEDICATION Neuroquell: Take 1 capsule by mouth once a day for immune health    . potassium chloride (KLOR-CON) 20 MEQ packet Take 40 mEq by mouth daily. 60 packet 0  . pravastatin (PRAVACHOL) 40 MG tablet TAKE 1 TABLET BY MOUTH EVERY DAY AT 6PM  30 tablet 9  . spironolactone (ALDACTONE) 25 MG tablet Take 1 tablet (25 mg total) by mouth daily. 90 tablet 3  . traMADol (ULTRAM) 50 MG tablet TAKE ONE TABLET BY MOUTH EVERY 6 HOURS AS NEEDED FOR MODERATE PAIN 30 tablet 0  . triamcinolone cream (KENALOG) 0.1 % Apply  1 application topically 2 (two) times daily. 30 g 0  . warfarin (COUMADIN) 4 MG tablet TAKE 1 TABLET(4 MG TOTAL) BY MOUTH DAILY. DO NOT TAKE WITH ANY OTHER WARFARIN UNLESS DISCARD REMAINDER.  INSTRUCTS. 30 tablet 3  . furosemide (LASIX) 40 MG tablet Take 2 tablets (80 mg total) by mouth 2 (two) times daily. (Patient not taking: Reported on 10/12/2018) 120 tablet 2   No current facility-administered medications on file prior to visit.    No Known Allergies Social History   Socioeconomic History  . Marital status: Married    Spouse name: Not on file  . Number of children: 4  . Years of education: Not on file  . Highest education level: Not on file  Occupational History    Employer: RETIRED  Social Needs  . Financial resource strain: Not on file  . Food insecurity:    Worry: Not on file    Inability: Not on file  . Transportation needs:    Medical: Not on file    Non-medical: Not on file  Tobacco Use  . Smoking status: Former Smoker    Types: Cigarettes, Pipe, Landscape architect  . Smokeless tobacco: Former Systems developer    Types: Chew  . Tobacco comment: QUIT SMOKING  MANY YEARS AGO"  Substance and Sexual Activity  . Alcohol use: No  . Drug use: No  . Sexual activity: Not on file  Lifestyle  . Physical activity:    Days per week: Not on file    Minutes per session: Not on file  . Stress: Not on file  Relationships  . Social connections:    Talks on phone: Not on file    Gets together: Not on file    Attends religious service: Not on file    Active member of club or organization: Not on file    Attends meetings of clubs or organizations: Not on file    Relationship status: Not on file  . Intimate partner violence:     Fear of current or ex partner: Not on file    Emotionally abused: Not on file    Physically abused: Not on file    Forced sexual activity: Not on file  Other Topics Concern  . Not on file  Social History Narrative   ** Merged History Encounter **       Four adopted children.  Lives alone.       Review of Systems  All other systems reviewed and are negative.      Objective:   Physical Exam  Constitutional: He appears well-developed and well-nourished. No distress.  Cardiovascular: Normal rate. An irregularly irregular rhythm present.  Murmur heard. Pulmonary/Chest: Effort normal. No respiratory distress. He has no wheezes. He has no rhonchi. He has rales in the left lower field.  Abdominal: Soft. Bowel sounds are normal. He exhibits no distension. There is no abdominal tenderness. There is no rebound.  Musculoskeletal:        General: Edema present.     Right lower leg: Edema present.     Left lower leg: Edema present.  Skin: He is not diaphoretic.  Vitals reviewed.  He has poor balance, now in wheelchair    Assessment & Plan:  Leg swelling - Plan: BASIC METABOLIC PANEL WITH GFR  Persistent atrial fibrillation - Plan: PT with INR/Fingerstick  Increase Bumex to 4 mg twice daily.  He is not on any potassium replacement despite this being on his medication list.  His wife states that he  is not taking any potassium.  Therefore I will check his BMP and adjust his potassium replacement if necessary.  Reassess on Thursday.  While the patient was here, I checked his INR.  INR is therapeutic at 2.6 with a PTT of 31.7.  We will make no changes in his Coumadin dose.  Reassess via telephone on Thursday.

## 2018-10-13 ENCOUNTER — Other Ambulatory Visit: Payer: Self-pay | Admitting: Family Medicine

## 2018-10-13 LAB — BASIC METABOLIC PANEL WITH GFR
BUN/Creatinine Ratio: 22 (calc) (ref 6–22)
BUN: 37 mg/dL — ABNORMAL HIGH (ref 7–25)
CALCIUM: 9.1 mg/dL (ref 8.6–10.3)
CO2: 27 mmol/L (ref 20–32)
Chloride: 101 mmol/L (ref 98–110)
Creat: 1.68 mg/dL — ABNORMAL HIGH (ref 0.70–1.11)
GFR, Est African American: 42 mL/min/{1.73_m2} — ABNORMAL LOW (ref >=60)
GFR, Est Non African American: 36 mL/min/{1.73_m2} — ABNORMAL LOW (ref >=60)
Glucose, Bld: 137 mg/dL — ABNORMAL HIGH (ref 65–99)
Potassium: 4.3 mmol/L (ref 3.5–5.3)
Sodium: 137 mmol/L (ref 135–146)

## 2018-10-13 LAB — EXTRA LAV TOP TUBE

## 2018-10-14 ENCOUNTER — Telehealth: Payer: Self-pay

## 2018-10-14 NOTE — Telephone Encounter (Signed)
Called pt to schedule repeat EGD procedure as instructed by Dr. Vicente Males.  Unable to contact, LVM to return call

## 2018-10-14 NOTE — Telephone Encounter (Signed)
-----   Message from [place], [place] sent at 03/26/2018  2:01 PM EDT ----- Pt will need repeat EGD for gastric mapping due to extensive intestinal metaplasia and atrophy of the stomach.

## 2018-10-15 ENCOUNTER — Ambulatory Visit (INDEPENDENT_AMBULATORY_CARE_PROVIDER_SITE_OTHER): Payer: Medicare Other | Admitting: Family Medicine

## 2018-10-15 ENCOUNTER — Other Ambulatory Visit: Payer: Self-pay

## 2018-10-15 ENCOUNTER — Encounter: Payer: Self-pay | Admitting: Family Medicine

## 2018-10-15 VITALS — BP 98/60 | HR 87 | Temp 98.2°F | Resp 16 | Ht 67.0 in | Wt 206.0 lb

## 2018-10-15 DIAGNOSIS — I5032 Chronic diastolic (congestive) heart failure: Secondary | ICD-10-CM

## 2018-10-15 DIAGNOSIS — M7989 Other specified soft tissue disorders: Secondary | ICD-10-CM

## 2018-10-15 NOTE — Progress Notes (Signed)
Subjective:    Patient ID: [name], male    DOB: 10/11/1928, 85 y.o.   MRN: [id number]  HPI   09/10/18 I reviewed the patient's recent visit with his cardiologist.  Since that time, his legs have started to swell dramatically.  He has +2 pitting edema in his right leg up to his knee.  He is unable to wear his compression hose due to the swelling.  He also has +2 pitting edema in his left leg up to his knee.  His legs are so swollen that they hurt.  He also reports dyspnea with exertion which is mild.  He denies any chest pain.  He denies any syncope.  He denies any lightheadedness.  His blood pressure today however is extremely soft at 88/60.  I confirmed this personally.  However he has bibasilar crackles in both lungs dusting fluid overload despite his weight essentially remaining stable.  At that time, my plan was: Patient's exam today is markedly different than his exam in early January.  He now has pitting edema and rales on pulmonary exam.  He appears fluid overloaded today.  Furthermore his blood pressure is low.  We discussed going to the hospital for diuresis.  The patient feels fine other than pain in his legs due to the swelling and mild shortness of breath with activity.  He wants to avoid the hospital due to the exposure to flu and other illnesses which I agree would likely kill him if he contracted a secondary infection.  Therefore we will discontinue metoprolol to avoid hypotension after diuresis and increase his Lasix to 3 pills twice daily.  I will see the patient back first thing tomorrow.  Consider using compression Unna boots tomorrow if necessary to help control the swelling.  Explained to the patient and his wife that I am concerned he may be showing worsening signs of congestive heart failure and that this could be a sign that his condition is becoming more serious and potentially life-threatening.  Patient understands and elects to go home.  He will follow-up with me tomorrow  and we will attempt outpatient diuresis.  Consider hospice referral if the patient continues to deteriorate  09/11/18 Patient's blood pressure is now improved discontinue metoprolol.  However despite increasing Lasix there has not been substantial improvement in the swelling in his legs.  I believe the patient is more third spacing then fluid overloaded.  Because his legs are swollen so bad he is unable to wear his compression hose and therefore the third spacing and pitting edema in his legs continues to worsen.  He denies any dizziness or syncope or chest pain or shortness of breath.  At that time, my plan was: Continue Lasix 3 tablets twice daily however I placed the patient in bilateral Unna boots today in an effort to help with the third spacing to drive the pitting edema from the interstitial tissues back into the blood vessels where the diuretics can be more effective.  We will see the patient back on Monday to remove the Unna boots and at that time we will switch him back to his compression hose if the swelling has improved.  Resume previous dose of Lasix, 2 pills twice a day on Sunday to avoid dehydration.  If swelling is better Monday, will resume the metoprolol at that time after we have decreased his Lasix  09/14/18 Patient is here today to follow-up.  I removed his Unna boots.  The swelling had improved dramatically in  both feet and is down to normal now.  Patient brings in a brand-new pair of compression hose for me to apply to his legs today.  I was able to put the patient back into his compression support hose today after I remove the Unna boots.  His blood pressure is also better and now appears stable to resume metoprolol especially given that he is no longer taking the elevated dose of Lasix.  At that time, my plan was: I remove the patient's Unna boots.  He was placed in compression hose bilaterally.  Continue to wear compression hose on a daily basis to control swelling.  Resume previous  dose of Lasix 80 mg twice daily.Marland Kitchen  Resume metoprolol 12.5 mg p.o. twice daily  10/08/18 Wt Readings from Last 3 Encounters:  10/12/18 206 lb (93.4 kg)  10/08/18 212 lb (96.2 kg)  09/14/18 209 lb (94.8 kg)   Patient presents today with swelling in both legs right greater than left.  He has +2 pitting edema in his right leg and +1 pitting edema in his left leg up to his knee.  Patient denies any chest pain.  He has chronic shortness of breath is no different from his baseline.  Weight is increased 4 pounds since 1 month ago.  He is taking lasix 120 mg in the morning and 80 mg in the evening and has not achieved any successful diuresis.  He has stopped wearing compression hose due to the pain in his legs.  Stopped the compression hose the day after I last saw him.  AT that time, my plan was: Discontinue Lasix and switch to Bumex 2 mg p.o. twice daily.  Recheck via telephone tomorrow.  Will increase to 4 mg p.o. twice daily if the patient has not experienced any diuresis and will recheck the patient on Monday or sooner if worse.  Continue current potassium dose  10/12/18 Patient has seen no benefit since switching from Lasix to Bumex 2 mg twice daily.  He continues to have significant edema in his right leg greater than his left leg.  It is still tender to palpation in both his right and left leg.  He also has dyspnea on exertion and he has left basilar crackles appreciated on today's exam.  At that time, my plan was: Increase Bumex to 4 mg twice daily.  He is not on any potassium replacement despite this being on his medication list.  His wife states that he is not taking any potassium.  Therefore I will check his BMP and adjust his potassium replacement if necessary.  Reassess on Thursday.  While the patient was here, I checked his INR.  INR is therapeutic at 2.6 with a PTT of 31.7.  We will make no changes in his Coumadin dose.  Reassess via telephone on Thursday.  10/15/18 Unfortunately, leg swelling does  not seem any better.  In fact it appears to be worse.  Pitting edema in the right leg is +2.  Now pitting edema in the left leg is approaching +2 all the way up to just below the knee.  The patient's skin hurts simply from the stretch.  He reports aching and throbbing in his legs.  Yesterday was particularly bad.  Today is a little bit better.  Unfortunately he is unable to wear any compression hose to prevent third spacing.  He is no longer eating very well.  He is only drinking water.  I believe the protein calorie malnutrition is likely decreasing the intravascular  oncotic pressure and leading to worsening third spacing.  On his most recent BMP, his renal function was deteriorating showing depleted intravascular volume.  Therefore I am unable to increase his diuretic dose to improve the third spacing.  Past Medical History:  Diagnosis Date   Atelectasis of right lung    Chronic diastolic (congestive) heart failure ([place])    a. 10/2013 Echo: EF 55-60%, basal-mid anteroseptal and basal-mid inferoseptal HK; b. 02/2017 Echo: EF 60-65%, Gr3 DD.   Chronic fatigue    Chronic subdural hematoma ([place])    a. 12/2015 Head CT: chronic right holo-hemispheric SDH w/o midline shift.   CKD (chronic kidney disease), stage III ([place])    Coronary artery disease    a. 11/2014 NSTEMI: DESx 2 placed to LAD and RCA; b. 08/2015 Cath: LAD patent stent, LCX 58m, RCA patent stent, 74m.   Diabetes mellitus, type II ([place])    a. HgA1c 6.8 in 03/2015   ED (erectile dysfunction)    Gout    Hemangioma of liver 08/02/2015   12 mm enhancing lesion seen on CT - suspicious for benign hemangioma but not diagnostic - MRI recommended   HLD (hyperlipidemia)    Hypertension    Hypertensive heart disease    Ischemic finger ulcer ([place])    a. 03/2017 Seen for digital ulcers - felt to be embolic in setting of PAF. Pt on coumadin.   Kidney stones    Lower extremity edema    a. chronic   OA (osteoarthritis)    Orthostatic  hypotension    PAF (paroxysmal atrial fibrillation) ([place])    a. 10/2015 s/p DCCV;  b. CHA2DS2VASc = 6--> on coumadin; c. 10/2015 Echo: sev dil LA.   Prostate cancer ([place])    Pulmonary HTN ([place])    Pulmonary hypertension ([place])    a. 10/2015 Echo: PASP 43mmHg.   Renal insufficiency    Severe mitral regurgitation    a.  10/2015 s/p minimally invasive MV repair; b. 10/2015 Echo: mod MS, mean grad 28mmHg; c. 02/2017 Echo: EF 60-65%, Gr3 DD, mod Ca2+ MV annulus, mod to sev dil LA, mod dil RA, mod TR. PASP 54mmHg.   Spinal stenosis    SVT (supraventricular tachycardia) ([facility])    a. recurrent, usually responsive to vagal manuevers   Tricuspid regurgitation    a. 10/2015 Echo: mild to mod TR.   Past Surgical History:  Procedure Laterality Date   BACK SURGERY     Back surgery x 2     CARDIAC CATHETERIZATION N/A 08/08/2015   Procedure: Right/Left Heart Cath and Coronary Angiography;  Surgeon: Sherren Mocha, MD;  Location: [facility];  Service: Cardiovascular;  Laterality: N/A;   CARDIAC SURGERY     2 cardiac stents   CARDIOVERSION N/A 10/20/2015   Procedure: CARDIOVERSION;  Surgeon: Pixie Casino, MD;  Location: [facility];  Service: Cardiovascular;  Laterality: N/A;   CERVICAL SPINE SURGERY     CHOLECYSTECTOMY N/A 07/08/2016   Procedure: LAPAROSCOPIC CHOLECYSTECTOMY WITH INTRAOPERATIVE CHOLANGIOGRAM;  Surgeon: Georganna Skeans, MD;  Location: [facility];  Service: General;  Laterality: N/A;   CORONARY ARTERY BYPASS GRAFT     ESOPHAGOGASTRODUODENOSCOPY (EGD) WITH PROPOFOL N/A 03/19/2018   Procedure: ESOPHAGOGASTRODUODENOSCOPY (EGD) WITH PROPOFOL;  Surgeon: [name], MD;  Location: [facility];  Service: Gastroenterology;  Laterality: N/A;   EYE SURGERY     INTRAOPERATIVE TRANSESOPHAGEAL ECHOCARDIOGRAM  10/05/2015   Procedure: INTRAOPERATIVE TRANSESOPHAGEAL ECHOCARDIOGRAM;  Surgeon: Rexene Alberts, MD;  Location: [facility];  Service: Open  Heart Surgery;;   JOINT REPLACEMENT      shoulder   LEFT HEART CATHETERIZATION WITH CORONARY ANGIOGRAM N/A 11/16/2014   Procedure: LEFT HEART CATHETERIZATION WITH CORONARY ANGIOGRAM;  Surgeon: Troy Sine, MD;  Location[facility];  Service: Cardiovascular;  Laterality: N/A;   MITRAL VALVE REPAIR Right 10/04/2015   Procedure: MINIMALLY INVASIVE MITRAL VALVE REPAIR (MVR);  Surgeon: Rexene Alberts, MD;  Location: [facility];  Service: Open Heart Surgery;  Laterality: Right;   Rt rotator cuff repair     TEE WITHOUT CARDIOVERSION N/A 06/22/2015   Procedure: TRANSESOPHAGEAL ECHOCARDIOGRAM (TEE);  Surgeon: Skeet Latch, MD;  Location: [facility];  Service: Cardiovascular;  Laterality: N/A;   TEE WITHOUT CARDIOVERSION N/A 10/04/2015   Procedure: TRANSESOPHAGEAL ECHOCARDIOGRAM (TEE);  Surgeon: Rexene Alberts, MD;  Location: [facility];  Service: Open Heart Surgery;  Laterality: N/A;   WOUND EXPLORATION N/A 10/05/2015   Procedure: Sternal Incision;  Surgeon: Rexene Alberts, MD;  Location: [facility];  Service: Open Heart Surgery;  Laterality: N/A;   Current Outpatient Medications on File Prior to Visit  Medication Sig Dispense Refill   allopurinol (ZYLOPRIM) 100 MG tablet TAKE ONE (1) TABLET EACH DAY 90 tablet 2   bumetanide (BUMEX) 2 MG tablet TAKE 1 TABLET(2 MG) BY MOUTH TWICE DAILY. 180 tablet 0   cyclobenzaprine (FLEXERIL) 5 MG tablet Take 1 tablet (5 mg total) by mouth 3 (three) times daily as needed for muscle spasms. 30 tablet 0   docusate sodium (COLACE) 100 MG capsule Take 1 capsule (100 mg total) by mouth daily as needed for moderate constipation. 60 capsule 2   finasteride (PROSCAR) 5 MG tablet Take 5 mg by mouth at bedtime.     furosemide (LASIX) 40 MG tablet Take 2 tablets (80 mg total) by mouth 2 (two) times daily. (Patient not taking: Reported on 10/12/2018) 120 tablet 2   loratadine (CLARITIN) 10 MG tablet Take 1 tablet (10 mg total) by mouth daily as needed for allergies. 30 tablet 3   metoprolol tartrate (LOPRESSOR) 25 MG  tablet Take 0.5 tablets (12.5 mg total) by mouth 2 (two) times daily. 90 tablet 3   OVER THE COUNTER MEDICATION Neuroqu[place]: Take 1 capsule by mouth once a day for immune health     pravastatin (PRAVACHOL) 40 MG tablet TAKE 1 TABLET BY MOUTH EVERY DAY AT 6PM 30 tablet 9   spironolactone (ALDACTONE) 25 MG tablet Take 1 tablet (25 mg total) by mouth daily. 90 tablet 3   traMADol (ULTRAM) 50 MG tablet TAKE ONE TABLET BY MOUTH EVERY 6 HOURS AS NEEDED FOR MODERATE PAIN 30 tablet 0   triamcinolone cream (KENALOG) 0.1 % Apply 1 application topically 2 (two) times daily. 30 g 0   warfarin (COUMADIN) 4 MG tablet TAKE 1 TABLET(4 MG TOTAL) BY MOUTH DAILY. DO NOT TAKE WITH ANY OTHER WARFARIN UNLESS DISCARD REMAINDER. Abri Vacca INSTRUCTS. 30 tablet 3   No current facility-administered medications on file prior to visit.    No Known Allergies Social History   Socioeconomic History   Marital status: Married    Spouse name: Not on file   Number of children: 4   Years of education: Not on file   Highest education level: Not on file  Occupational History    Employer: RETIRED  Social Needs   Financial resource strain: Not on file   Food insecurity:    Worry: Not on file    Inability: Not on file   Transportation needs:  Medical: Not on file    Non-medical: Not on file  Tobacco Use   Smoking status: Former Smoker    Types: Cigarettes, Pipe, Cigars   Smokeless tobacco: Former Systems developer    Types: Chew   Tobacco comment: [place] YEARS AGO"  Substance and Sexual Activity   Alcohol use: No   Drug use: No   Sexual activity: Not on file  Lifestyle   Physical activity:    Days per week: Not on file    Minutes per session: Not on file   Stress: Not on file  Relationships   Social connections:    Talks on phone: Not on file    Gets together: Not on file    Attends religious service: Not on file    Active member of club or organization: Not on file    Attends meetings of  clubs or organizations: Not on file    Relationship status: Not on file   Intimate partner violence:    Fear of current or ex partner: Not on file    Emotionally abused: Not on file    Physically abused: Not on file    Forced sexual activity: Not on file  Other Topics Concern   Not on file  Social History Narrative   ** Merged History Encounter **       Four adopted children.  Lives alone.       Review of Systems  All other systems reviewed and are negative.      Objective:   Physical Exam  Constitutional: He appears well-developed and well-nourished. No distress.  Cardiovascular: Normal rate. An irregularly irregular rhythm present.  Murmur heard. Pulmonary/Chest: Effort normal. No respiratory distress. He has no wheezes. He has no rhonchi. He has no rales.  Abdominal: Soft. Bowel sounds are normal. He exhibits no distension. There is no abdominal tenderness. There is no rebound.  Musculoskeletal:        General: Edema present.     Right lower leg: Edema present.     Left lower leg: Edema present.  Skin: He is not diaphoretic.  Vitals reviewed.  He has poor balance, now in wheelchair    Assessment & Plan:  Leg swelling - Plan: BASIC METABOLIC PANEL WITH GFR  Chronic diastolic CHF (congestive heart failure) ([place])  Patient has significant leg swelling due to chronic venous insufficiency as well as chronic diastolic congestive heart failure.  Now due to protein calorie malnutrition, he is losing intravascular oncotic pressure contributing to the third spacing and worsening the edema.  He is unable to wear compression hose at home which is only exacerbating the problem and oral diuretics are approaching prerenal azotemia.  Therefore I placed the patient in bilateral Unna boots today and will recheck the patient on Monday.  If this is helping, I will start having home health nursing apply the Unna boots once a week for 3 or 4 days and then remove the Unna boots in an effort  to try to maintain and control his leg swelling.  Patient is unable to wear compression hose but we need to do some type of compressive therapy to help manage and control his leg swelling.  He has current diuretic dose.  Encouraged protein supplement such as Ensure 2 to 3 cans a day due to his poor appetite.

## 2018-10-16 ENCOUNTER — Encounter: Payer: Self-pay | Admitting: Family Medicine

## 2018-10-16 LAB — BASIC METABOLIC PANEL WITH GFR
BUN / CREAT RATIO: 30 (calc) — AB (ref 6–22)
BUN: 43 mg/dL — AB (ref 7–25)
CO2: 26 mmol/L (ref 20–32)
Calcium: 9.3 mg/dL (ref 8.6–10.3)
Chloride: 99 mmol/L (ref 98–110)
Creat: 1.45 mg/dL — ABNORMAL HIGH (ref 0.70–1.11)
GFR, Est African American: 50 mL/min/{1.73_m2} — ABNORMAL LOW (ref >=60)
GFR, Est Non African American: 43 mL/min/{1.73_m2} — ABNORMAL LOW (ref >=60)
Glucose, Bld: 130 mg/dL — ABNORMAL HIGH (ref 65–99)
Potassium: 4 mmol/L (ref 3.5–5.3)
Sodium: 136 mmol/L (ref 135–146)

## 2018-10-19 ENCOUNTER — Ambulatory Visit (INDEPENDENT_AMBULATORY_CARE_PROVIDER_SITE_OTHER): Payer: Medicare Other | Admitting: Family Medicine

## 2018-10-19 ENCOUNTER — Encounter: Payer: Self-pay | Admitting: Family Medicine

## 2018-10-19 ENCOUNTER — Other Ambulatory Visit: Payer: Self-pay

## 2018-10-19 VITALS — BP 110/70 | HR 52 | Temp 97.4°F | Resp 16 | Ht 67.0 in | Wt 204.0 lb

## 2018-10-19 DIAGNOSIS — M7989 Other specified soft tissue disorders: Secondary | ICD-10-CM

## 2018-10-19 DIAGNOSIS — I5032 Chronic diastolic (congestive) heart failure: Secondary | ICD-10-CM | POA: Diagnosis not present

## 2018-10-19 NOTE — Progress Notes (Signed)
Subjective:    Patient ID: [name], male    DOB: 10/11/1928, 85 y.o.   MRN: [id number]  HPI   09/10/18 I reviewed the patient's recent visit with his cardiologist.  Since that time, his legs have started to swell dramatically.  He has +2 pitting edema in his right leg up to his knee.  He is unable to wear his compression hose due to the swelling.  He also has +2 pitting edema in his left leg up to his knee.  His legs are so swollen that they hurt.  He also reports dyspnea with exertion which is mild.  He denies any chest pain.  He denies any syncope.  He denies any lightheadedness.  His blood pressure today however is extremely soft at 88/60.  I confirmed this personally.  However he has bibasilar crackles in both lungs dusting fluid overload despite his weight essentially remaining stable.  At that time, my plan was: Patient's exam today is markedly different than his exam in early January.  He now has pitting edema and rales on pulmonary exam.  He appears fluid overloaded today.  Furthermore his blood pressure is low.  We discussed going to the hospital for diuresis.  The patient feels fine other than pain in his legs due to the swelling and mild shortness of breath with activity.  He wants to avoid the hospital due to the exposure to flu and other illnesses which I agree would likely kill him if he contracted a secondary infection.  Therefore we will discontinue metoprolol to avoid hypotension after diuresis and increase his Lasix to 3 pills twice daily.  I will see the patient back first thing tomorrow.  Consider using compression Unna boots tomorrow if necessary to help control the swelling.  Explained to the patient and his wife that I am concerned he may be showing worsening signs of congestive heart failure and that this could be a sign that his condition is becoming more serious and potentially life-threatening.  Patient understands and elects to go home.  He will follow-up with me tomorrow  and we will attempt outpatient diuresis.  Consider hospice referral if the patient continues to deteriorate  09/11/18 Patient's blood pressure is now improved discontinue metoprolol.  However despite increasing Lasix there has not been substantial improvement in the swelling in his legs.  I believe the patient is more third spacing then fluid overloaded.  Because his legs are swollen so bad he is unable to wear his compression hose and therefore the third spacing and pitting edema in his legs continues to worsen.  He denies any dizziness or syncope or chest pain or shortness of breath.  At that time, my plan was: Continue Lasix 3 tablets twice daily however I placed the patient in bilateral Unna boots today in an effort to help with the third spacing to drive the pitting edema from the interstitial tissues back into the blood vessels where the diuretics can be more effective.  We will see the patient back on Monday to remove the Unna boots and at that time we will switch him back to his compression hose if the swelling has improved.  Resume previous dose of Lasix, 2 pills twice a day on Sunday to avoid dehydration.  If swelling is better Monday, will resume the metoprolol at that time after we have decreased his Lasix  09/14/18 Patient is here today to follow-up.  I removed his Unna boots.  The swelling had improved dramatically in  both feet and is down to normal now.  Patient brings in a brand-new pair of compression hose for me to apply to his legs today.  I was able to put the patient back into his compression support hose today after I remove the Unna boots.  His blood pressure is also better and now appears stable to resume metoprolol especially given that he is no longer taking the elevated dose of Lasix.  At that time, my plan was: I remove the patient's Unna boots.  He was placed in compression hose bilaterally.  Continue to wear compression hose on a daily basis to control swelling.  Resume previous  dose of Lasix 80 mg twice daily.Marland Kitchen  Resume metoprolol 12.5 mg p.o. twice daily  10/08/18 Wt Readings from Last 3 Encounters:  10/19/18 204 lb (92.5 kg)  10/15/18 206 lb (93.4 kg)  10/12/18 206 lb (93.4 kg)   Patient presents today with swelling in both legs right greater than left.  He has +2 pitting edema in his right leg and +1 pitting edema in his left leg up to his knee.  Patient denies any chest pain.  He has chronic shortness of breath is no different from his baseline.  Weight is increased 4 pounds since 1 month ago.  He is taking lasix 120 mg in the morning and 80 mg in the evening and has not achieved any successful diuresis.  He has stopped wearing compression hose due to the pain in his legs.  Stopped the compression hose the day after I last saw him.  AT that time, my plan was: Discontinue Lasix and switch to Bumex 2 mg p.o. twice daily.  Recheck via telephone tomorrow.  Will increase to 4 mg p.o. twice daily if the patient has not experienced any diuresis and will recheck the patient on Monday or sooner if worse.  Continue current potassium dose  10/12/18 Patient has seen no benefit since switching from Lasix to Bumex 2 mg twice daily.  He continues to have significant edema in his right leg greater than his left leg.  It is still tender to palpation in both his right and left leg.  He also has dyspnea on exertion and he has left basilar crackles appreciated on today's exam.  At that time, my plan was: Increase Bumex to 4 mg twice daily.  He is not on any potassium replacement despite this being on his medication list.  His wife states that he is not taking any potassium.  Therefore I will check his BMP and adjust his potassium replacement if necessary.  Reassess on Thursday.  While the patient was here, I checked his INR.  INR is therapeutic at 2.6 with a PTT of 31.7.  We will make no changes in his Coumadin dose.  Reassess via telephone on Thursday.  10/15/18 Unfortunately, leg swelling does  not seem any better.  In fact it appears to be worse.  Pitting edema in the right leg is +2.  Now pitting edema in the left leg is approaching +2 all the way up to just below the knee.  The patient's skin hurts simply from the stretch.  He reports aching and throbbing in his legs.  Yesterday was particularly bad.  Today is a little bit better.  Unfortunately he is unable to wear any compression hose to prevent third spacing.  He is no longer eating very well.  He is only drinking water.  I believe the protein calorie malnutrition is likely decreasing the intravascular  oncotic pressure and leading to worsening third spacing.  On his most recent BMP, his renal function was deteriorating showing depleted intravascular volume.  Therefore I am unable to increase his diuretic dose to improve the third spacing.  At that time, my plan was: Patient has significant leg swelling due to chronic venous insufficiency as well as chronic diastolic congestive heart failure.  Now due to protein calorie malnutrition, he is losing intravascular oncotic pressure contributing to the third spacing and worsening the edema.  He is unable to wear compression hose at home which is only exacerbating the problem and oral diuretics are approaching prerenal azotemia.  Therefore I placed the patient in bilateral Unna boots today and will recheck the patient on Monday.  If this is helping, I will start having home health nursing apply the Unna boots once a week for 3 or 4 days and then remove the Unna boots in an effort to try to maintain and control his leg swelling.  Patient is unable to wear compression hose but we need to do some type of compressive therapy to help manage and control his leg swelling.  He has current diuretic dose.  Encouraged protein supplement such as Ensure 2 to 3 cans a day due to his poor appetite.  10/19/18 Swelling has improved dramatically in both legs.  He has no edema distal to his knee and his left leg.  There is  some +1 pitting edema in his foot past the ankle down around the toes and the dorsal forefoot.  He has trace to +1 edema in his right leg distal to the knee.  He has +1 edema in the forefoot past the ankle.  Legs feel much better.  He is lost 2 pounds.  Unna boots were removed today.  However the patient is unable to change his compression hose.  His wife is not strong enough to put his compression hose on.  The patient is not strong enough to put his compression hose on.  Without compression therapy, the swelling will quickly return.  I explained this to the patient in detail.  I believe he would benefit from having home health nurse come out once a week and placing him in an Unna boot and then removing it 3 days later and then going 3 days without the Unna boot to try to moderate and control the swelling.  Patient is willing to try this.  Past Medical History:  Diagnosis Date   Atelectasis of right lung    Chronic diastolic (congestive) heart failure ([place])    a. 10/2013 Echo: EF 55-60%, basal-mid anteroseptal and basal-mid inferoseptal HK; b. 02/2017 Echo: EF 60-65%, Gr3 DD.   Chronic fatigue    Chronic subdural hematoma ([place])    a. 12/2015 Head CT: chronic right holo-hemispheric SDH w/o midline shift.   CKD (chronic kidney disease), stage III ([place])    Coronary artery disease    a. 11/2014 NSTEMI: DESx 2 placed to LAD and RCA; b. 08/2015 Cath: LAD patent stent, LCX 57m, RCA patent stent, 37m.   Diabetes mellitus, type II ([place])    a. HgA1c 6.8 in 03/2015   ED (erectile dysfunction)    Gout    Hemangioma of liver 08/02/2015   12 mm enhancing lesion seen on CT - suspicious for benign hemangioma but not diagnostic - MRI recommended   HLD (hyperlipidemia)    Hypertension    Hypertensive heart disease    Ischemic finger ulcer ([place])    a. 03/2017 Seen  for digital ulcers - felt to be embolic in setting of PAF. Pt on coumadin.   Kidney stones    Lower extremity edema    a. chronic   OA  (osteoarthritis)    Orthostatic hypotension    PAF (paroxysmal atrial fibrillation) ([place])    a. 10/2015 s/p DCCV;  b. CHA2DS2VASc = 6--> on coumadin; c. 10/2015 Echo: sev dil LA.   Prostate cancer ([place])    Pulmonary HTN ([place])    Pulmonary hypertension ([place])    a. 10/2015 Echo: PASP 70mmHg.   Renal insufficiency    Severe mitral regurgitation    a.  10/2015 s/p minimally invasive MV repair; b. 10/2015 Echo: mod MS, mean grad 80mmHg; c. 02/2017 Echo: EF 60-65%, Gr3 DD, mod Ca2+ MV annulus, mod to sev dil LA, mod dil RA, mod TR. PASP 17mmHg.   Spinal stenosis    SVT (supraventricular tachycardia) ([facility])    a. recurrent, usually responsive to vagal manuevers   Tricuspid regurgitation    a. 10/2015 Echo: mild to mod TR.   Past Surgical History:  Procedure Laterality Date   BACK SURGERY     Back surgery x 2     CARDIAC CATHETERIZATION N/A 08/08/2015   Procedure: Right/Left Heart Cath and Coronary Angiography;  Surgeon: Sherren Mocha, MD;  Location: [facility];  Service: Cardiovascular;  Laterality: N/A;   CARDIAC SURGERY     2 cardiac stents   CARDIOVERSION N/A 10/20/2015   Procedure: CARDIOVERSION;  Surgeon: Pixie Casino, MD;  Location: [facility];  Service: Cardiovascular;  Laterality: N/A;   CERVICAL SPINE SURGERY     CHOLECYSTECTOMY N/A 07/08/2016   Procedure: LAPAROSCOPIC CHOLECYSTECTOMY WITH INTRAOPERATIVE CHOLANGIOGRAM;  Surgeon: Georganna Skeans, MD;  Location: [facility];  Service: General;  Laterality: N/A;   CORONARY ARTERY BYPASS GRAFT     ESOPHAGOGASTRODUODENOSCOPY (EGD) WITH PROPOFOL N/A 03/19/2018   Procedure: ESOPHAGOGASTRODUODENOSCOPY (EGD) WITH PROPOFOL;  Surgeon: [name], MD;  Location: [facility]OS[facility];  Service: Gastroenterology;  Laterality: N/A;   EYE SURGERY     INTRAOPERATIVE TRANSESOPHAGEAL ECHOCARDIOGRAM  10/05/2015   Procedure: INTRAOPERATIVE TRANSESOPHAGEAL ECHOCARDIOGRAM;  Surgeon: Rexene Alberts, MD;  Location: [facility];  Service: Open Heart  Surgery;;   JOINT REPLACEMENT     shoulder   LEFT HEART CATHETERIZATION WITH CORONARY ANGIOGRAM N/A 11/16/2014   Procedure: LEFT HEART CATHETERIZATION WITH CORONARY ANGIOGRAM;  Surgeon: Troy Sine, MD;  Location: [facility] CATH LAB;  Service: Cardiovascular;  Laterality: N/A;   MITRAL VALVE REPAIR Right 10/04/2015   Procedure: MINIMALLY INVASIVE MITRAL VALVE REPAIR (MVR);  Surgeon: Rexene Alberts, MD;  Location: [facility];  Service: Open Heart Surgery;  Laterality: Right;   Rt rotator cuff repair     TEE WITHOUT CARDIOVERSION N/A 06/22/2015   Procedure: TRANSESOPHAGEAL ECHOCARDIOGRAM (TEE);  Surgeon: Skeet Latch, MD;  Location: [facility];  Service: Cardiovascular;  Laterality: N/A;   TEE WITHOUT CARDIOVERSION N/A 10/04/2015   Procedure: TRANSESOPHAGEAL ECHOCARDIOGRAM (TEE);  Surgeon: Rexene Alberts, MD;  Location: [facility];  Service: Open Heart Surgery;  Laterality: N/A;   WOUND EXPLORATION N/A 10/05/2015   Procedure: Sternal Incision;  Surgeon: Rexene Alberts, MD;  Location: [facility];  Service: Open Heart Surgery;  Laterality: N/A;   Current Outpatient Medications on File Prior to Visit  Medication Sig Dispense Refill   allopurinol (ZYLOPRIM) 100 MG tablet TAKE ONE (1) TABLET EACH DAY 90 tablet 2   bumetanide (BUMEX) 2 MG tablet TAKE 1 TABLET(2 MG) BY MOUTH TWICE DAILY.  180 tablet 0   cyclobenzaprine (FLEXERIL) 5 MG tablet Take 1 tablet (5 mg total) by mouth 3 (three) times daily as needed for muscle spasms. 30 tablet 0   docusate sodium (COLACE) 100 MG capsule Take 1 capsule (100 mg total) by mouth daily as needed for moderate constipation. 60 capsule 2   finasteride (PROSCAR) 5 MG tablet Take 5 mg by mouth at bedtime.     loratadine (CLARITIN) 10 MG tablet Take 1 tablet (10 mg total) by mouth daily as needed for allergies. 30 tablet 3   metoprolol tartrate (LOPRESSOR) 25 MG tablet Take 0.5 tablets (12.5 mg total) by mouth 2 (two) times daily. 90 tablet 3   OVER THE COUNTER MEDICATION  Neuroquell: Take 1 capsule by mouth once a day for immune health     pravastatin (PRAVACHOL) 40 MG tablet TAKE 1 TABLET BY MOUTH EVERY DAY AT 6PM 30 tablet 9   spironolactone (ALDACTONE) 25 MG tablet Take 1 tablet (25 mg total) by mouth daily. 90 tablet 3   traMADol (ULTRAM) 50 MG tablet TAKE ONE TABLET BY MOUTH EVERY 6 HOURS AS NEEDED FOR MODERATE PAIN 30 tablet 0   triamcinolone cream (KENALOG) 0.1 % Apply 1 application topically 2 (two) times daily. 30 g 0   warfarin (COUMADIN) 4 MG tablet TAKE 1 TABLET(4 MG TOTAL) BY MOUTH DAILY. DO NOT TAKE WITH ANY OTHER WARFARIN UNLESS DISCARD REMAINDER. Rafi Kenneth INSTRUCTS. 30 tablet 3   furosemide (LASIX) 40 MG tablet Take 2 tablets (80 mg total) by mouth 2 (two) times daily. (Patient not taking: Reported on 10/12/2018) 120 tablet 2   No current facility-administered medications on file prior to visit.    No Known Allergies Social History   Socioeconomic History   Marital status: Married    Spouse name: Not on file   Number of children: 4   Years of education: Not on file   Highest education level: Not on file  Occupational History    Employer: RETIRED  Social Needs   Financial resource strain: Not on file   Food insecurity:    Worry: Not on file    Inability: Not on file   Transportation needs:    Medical: Not on file    Non-medical: Not on file  Tobacco Use   Smoking status: Former Smoker    Types: Cigarettes, Pipe, Cigars   Smokeless tobacco: Former Systems developer    Types: Chew   Tobacco comment: [place] YEARS AGO"  Substance and Sexual Activity   Alcohol use: No   Drug use: No   Sexual activity: Not on file  Lifestyle   Physical activity:    Days per week: Not on file    Minutes per session: Not on file   Stress: Not on file  Relationships   Social connections:    Talks on phone: Not on file    Gets together: Not on file    Attends religious service: Not on file    Active member of club or  organization: Not on file    Attends meetings of clubs or organizations: Not on file    Relationship status: Not on file   Intimate partner violence:    Fear of current or ex partner: Not on file    Emotionally abused: Not on file    Physically abused: Not on file    Forced sexual activity: Not on file  Other Topics Concern   Not on file  Social History Narrative   **  Merged History Encounter **       Four adopted children.  Lives alone.       Review of Systems  All other systems reviewed and are negative.      Objective:   Physical Exam  Constitutional: He appears well-developed and well-nourished. No distress.  Cardiovascular: Normal rate. An irregularly irregular rhythm present.  Murmur heard. Pulmonary/Chest: Effort normal. No respiratory distress. He has no wheezes. He has no rhonchi. He has no rales.  Abdominal: Soft. Bowel sounds are normal. He exhibits no distension. There is no abdominal tenderness. There is no rebound.  Musculoskeletal:        General: Edema present.     Right lower leg: Edema present.     Left lower leg: Edema present.  Skin: He is not diaphoretic.  Vitals reviewed.  He has poor balance, now in wheelchair    Assessment & Plan:  Leg swelling has improved dramatically status post Unna boots.  These were removed today.  However as I explained to the patient, the swelling will rapidly return without some type of compressive therapy.  He is not physically able to put on compression hose.  His wife is not physically able to put on compression hose.  They simply remove the socks usually after 1 day and then never put them on again.  Diuretics alone are unable to control swelling.  Therefore I have recommended home health come to his house and apply the Unna boots once a week for approximately 3 days and then remove the Unna boots.  Perhaps we do this weekly we can control swelling.  Patient could also come here to the office however is a tremendous  hardship for his wife as the patient is barely able to stand and she is not strong enough to get him in and out of the wheelchair at home.  Therefore I will see if home health can come do this to help manage the leg swelling.  The pain in his legs have improved dramatically now that we have improved the edema

## 2018-10-20 ENCOUNTER — Telehealth: Payer: Self-pay | Admitting: Family Medicine

## 2018-10-20 ENCOUNTER — Encounter: Payer: Self-pay | Admitting: *Deleted

## 2018-10-20 MED ORDER — FINASTERIDE 5 MG PO TABS: 5.0000 mg | ORAL_TABLET | Freq: Every day | ORAL | 2 refills | Status: AC

## 2018-10-20 NOTE — Telephone Encounter (Signed)
Refill on finasteride [facility] [place].

## 2018-10-20 NOTE — Telephone Encounter (Signed)
This encounter was created in error - please disregard.

## 2018-10-20 NOTE — Telephone Encounter (Signed)
Medication called/sent to requested pharmacy  

## 2018-10-22 ENCOUNTER — Telehealth: Payer: Self-pay | Admitting: Family Medicine

## 2018-10-22 MED ORDER — FUROSEMIDE 40 MG PO TABS: 80.0000 mg | ORAL_TABLET | Freq: Two times a day (BID) | ORAL | 3 refills | Status: DC

## 2018-10-22 NOTE — Telephone Encounter (Signed)
Medication called/sent to requested pharmacy  

## 2018-10-22 NOTE — Telephone Encounter (Signed)
Pt is out of fluid pills and wants a refill called in to pharmacy [facility] [place] states he takes 2 pills twice daily.

## 2018-10-23 ENCOUNTER — Other Ambulatory Visit: Payer: Self-pay | Admitting: Family Medicine

## 2018-10-23 MED ORDER — BUMETANIDE 2 MG PO TABS: 4.0000 mg | ORAL_TABLET | Freq: Two times a day (BID) | ORAL | 0 refills | Status: AC

## 2018-10-28 ENCOUNTER — Other Ambulatory Visit: Payer: Self-pay

## 2018-10-28 ENCOUNTER — Ambulatory Visit (INDEPENDENT_AMBULATORY_CARE_PROVIDER_SITE_OTHER): Payer: Medicare Other | Admitting: Family Medicine

## 2018-10-28 ENCOUNTER — Encounter: Payer: Self-pay | Admitting: Family Medicine

## 2018-10-28 VITALS — BP 100/60 | HR 43 | Temp 97.6°F | Resp 16 | Wt 212.0 lb

## 2018-10-28 DIAGNOSIS — I5032 Chronic diastolic (congestive) heart failure: Secondary | ICD-10-CM

## 2018-10-28 DIAGNOSIS — M7989 Other specified soft tissue disorders: Secondary | ICD-10-CM | POA: Diagnosis not present

## 2018-10-28 NOTE — Progress Notes (Signed)
Subjective:    Patient ID: [name], male    DOB: 10/11/1928, 85 y.o.   MRN: [id number]  HPI   09/10/18 I reviewed the patient's recent visit with his cardiologist.  Since that time, his legs have started to swell dramatically.  He has +2 pitting edema in his right leg up to his knee.  He is unable to wear his compression hose due to the swelling.  He also has +2 pitting edema in his left leg up to his knee.  His legs are so swollen that they hurt.  He also reports dyspnea with exertion which is mild.  He denies any chest pain.  He denies any syncope.  He denies any lightheadedness.  His blood pressure today however is extremely soft at 88/60.  I confirmed this personally.  However he has bibasilar crackles in both lungs dusting fluid overload despite his weight essentially remaining stable.  At that time, my plan was: Patient's exam today is markedly different than his exam in early January.  He now has pitting edema and rales on pulmonary exam.  He appears fluid overloaded today.  Furthermore his blood pressure is low.  We discussed going to the hospital for diuresis.  The patient feels fine other than pain in his legs due to the swelling and mild shortness of breath with activity.  He wants to avoid the hospital due to the exposure to flu and other illnesses which I agree would likely kill him if he contracted a secondary infection.  Therefore we will discontinue metoprolol to avoid hypotension after diuresis and increase his Lasix to 3 pills twice daily.  I will see the patient back first thing tomorrow.  Consider using compression Unna boots tomorrow if necessary to help control the swelling.  Explained to the patient and his wife that I am concerned he may be showing worsening signs of congestive heart failure and that this could be a sign that his condition is becoming more serious and potentially life-threatening.  Patient understands and elects to go home.  He will follow-up with me tomorrow  and we will attempt outpatient diuresis.  Consider hospice referral if the patient continues to deteriorate  09/11/18 Patient's blood pressure is now improved discontinue metoprolol.  However despite increasing Lasix there has not been substantial improvement in the swelling in his legs.  I believe the patient is more third spacing then fluid overloaded.  Because his legs are swollen so bad he is unable to wear his compression hose and therefore the third spacing and pitting edema in his legs continues to worsen.  He denies any dizziness or syncope or chest pain or shortness of breath.  At that time, my plan was: Continue Lasix 3 tablets twice daily however I placed the patient in bilateral Unna boots today in an effort to help with the third spacing to drive the pitting edema from the interstitial tissues back into the blood vessels where the diuretics can be more effective.  We will see the patient back on Monday to remove the Unna boots and at that time we will switch him back to his compression hose if the swelling has improved.  Resume previous dose of Lasix, 2 pills twice a day on Sunday to avoid dehydration.  If swelling is better Monday, will resume the metoprolol at that time after we have decreased his Lasix  09/14/18 Patient is here today to follow-up.  I removed his Unna boots.  The swelling had improved dramatically in  both feet and is down to normal now.  Patient brings in a brand-new pair of compression hose for me to apply to his legs today.  I was able to put the patient back into his compression support hose today after I remove the Unna boots.  His blood pressure is also better and now appears stable to resume metoprolol especially given that he is no longer taking the elevated dose of Lasix.  At that time, my plan was: I remove the patient's Unna boots.  He was placed in compression hose bilaterally.  Continue to wear compression hose on a daily basis to control swelling.  Resume previous  dose of Lasix 80 mg twice daily.Marland Kitchen  Resume metoprolol 12.5 mg p.o. twice daily  10/08/18 Wt Readings from Last 3 Encounters:  10/28/18 212 lb (96.2 kg)  10/19/18 204 lb (92.5 kg)  10/15/18 206 lb (93.4 kg)   Patient presents today with swelling in both legs right greater than left.  He has +2 pitting edema in his right leg and +1 pitting edema in his left leg up to his knee.  Patient denies any chest pain.  He has chronic shortness of breath is no different from his baseline.  Weight is increased 4 pounds since 1 month ago.  He is taking lasix 120 mg in the morning and 80 mg in the evening and has not achieved any successful diuresis.  He has stopped wearing compression hose due to the pain in his legs.  Stopped the compression hose the day after I last saw him.  AT that time, my plan was: Discontinue Lasix and switch to Bumex 2 mg p.o. twice daily.  Recheck via telephone tomorrow.  Will increase to 4 mg p.o. twice daily if the patient has not experienced any diuresis and will recheck the patient on Monday or sooner if worse.  Continue current potassium dose  10/12/18 Patient has seen no benefit since switching from Lasix to Bumex 2 mg twice daily.  He continues to have significant edema in his right leg greater than his left leg.  It is still tender to palpation in both his right and left leg.  He also has dyspnea on exertion and he has left basilar crackles appreciated on today's exam.  At that time, my plan was: Increase Bumex to 4 mg twice daily.  He is not on any potassium replacement despite this being on his medication list.  His wife states that he is not taking any potassium.  Therefore I will check his BMP and adjust his potassium replacement if necessary.  Reassess on Thursday.  While the patient was here, I checked his INR.  INR is therapeutic at 2.6 with a PTT of 31.7.  We will make no changes in his Coumadin dose.  Reassess via telephone on Thursday.  10/15/18 Unfortunately, leg swelling does  not seem any better.  In fact it appears to be worse.  Pitting edema in the right leg is +2.  Now pitting edema in the left leg is approaching +2 all the way up to just below the knee.  The patient's skin hurts simply from the stretch.  He reports aching and throbbing in his legs.  Yesterday was particularly bad.  Today is a little bit better.  Unfortunately he is unable to wear any compression hose to prevent third spacing.  He is no longer eating very well.  He is only drinking water.  I believe the protein calorie malnutrition is likely decreasing the intravascular  oncotic pressure and leading to worsening third spacing.  On his most recent BMP, his renal function was deteriorating showing depleted intravascular volume.  Therefore I am unable to increase his diuretic dose to improve the third spacing.  At that time, my plan was: Patient has significant leg swelling due to chronic venous insufficiency as well as chronic diastolic congestive heart failure.  Now due to protein calorie malnutrition, he is losing intravascular oncotic pressure contributing to the third spacing and worsening the edema.  He is unable to wear compression hose at home which is only exacerbating the problem and oral diuretics are approaching prerenal azotemia.  Therefore I placed the patient in bilateral Unna boots today and will recheck the patient on Monday.  If this is helping, I will start having home health nursing apply the Unna boots once a week for 3 or 4 days and then remove the Unna boots in an effort to try to maintain and control his leg swelling.  Patient is unable to wear compression hose but we need to do some type of compressive therapy to help manage and control his leg swelling.  He has current diuretic dose.  Encouraged protein supplement such as Ensure 2 to 3 cans a day due to his poor appetite.  10/19/18 Swelling has improved dramatically in both legs.  He has no edema distal to his knee and his left leg.  There is  some +1 pitting edema in his foot past the ankle down around the toes and the dorsal forefoot.  He has trace to +1 edema in his right leg distal to the knee.  He has +1 edema in the forefoot past the ankle.  Legs feel much better.  He is lost 2 pounds.  Unna boots were removed today.  However the patient is unable to change his compression hose.  His wife is not strong enough to put his compression hose on.  The patient is not strong enough to put his compression hose on.  Without compression therapy, the swelling will quickly return.  I explained this to the patient in detail.  I believe he would benefit from having home health nurse come out once a week and placing him in an Unna boot and then removing it 3 days later and then going 3 days without the Unna boot to try to moderate and control the swelling.  Patient is willing to try this.  At that time, my plan was: Leg swelling has improved dramatically status post Unna boots.  These were removed today.  However as I explained to the patient, the swelling will rapidly return without some type of compressive therapy.  He is not physically able to put on compression hose.  His wife is not physically able to put on compression hose.  They simply remove the socks usually after 1 day and then never put them on again.  Diuretics alone are unable to control swelling.  Therefore I have recommended home health come to his house and apply the Unna boots once a week for approximately 3 days and then remove the Unna boots.  Perhaps we do this weekly we can control swelling.  Patient could also come here to the office however is a tremendous hardship for his wife as the patient is barely able to stand and she is not strong enough to get him in and out of the wheelchair at home.  Therefore I will see if home health can come do this to help manage the leg  swelling.  The pain in his legs have improved dramatically now that we have improved the edema  10/28/18 Home health  nurses have not been able to come out to his home due to the coronavirus pandemic.  Patient is unable to wear compression hose.  As result, as anticipated, the swelling has quickly returned to both of his legs.  He has +2 pitting edema in his right leg up to his knee.  At times his leg is weeping from shallow small lesions on the anterior surface of his shin.  He has +1 pitting edema in his left leg up to his knee.  Both legs are aching due to the swelling and the pain.  Past Medical History:  Diagnosis Date   Atelectasis of right lung    Chronic diastolic (congestive) heart failure ([place])    a. 10/2013 Echo: EF 55-60%, basal-mid anteroseptal and basal-mid inferoseptal HK; b. 02/2017 Echo: EF 60-65%, Gr3 DD.   Chronic fatigue    Chronic subdural hematoma ([place])    a. 12/2015 Head CT: chronic right holo-hemispheric SDH w/o midline shift.   CKD (chronic kidney disease), stage III ([place])    Coronary artery disease    a. 11/2014 NSTEMI: DESx 2 placed to LAD and RCA; b. 08/2015 Cath: LAD patent stent, LCX 53m, RCA patent stent, 23m.   Diabetes mellitus, type II ([place])    a. HgA1c 6.8 in 03/2015   ED (erectile dysfunction)    Gout    Hemangioma of liver 08/02/2015   12 mm enhancing lesion seen on CT - suspicious for benign hemangioma but not diagnostic - MRI recommended   HLD (hyperlipidemia)    Hypertension    Hypertensive heart disease    Ischemic finger ulcer ([place])    a. 03/2017 Seen for digital ulcers - felt to be embolic in setting of PAF. Pt on coumadin.   Kidney stones    Lower extremity edema    a. chronic   OA (osteoarthritis)    Orthostatic hypotension    PAF (paroxysmal atrial fibrillation) ([place])    a. 10/2015 s/p DCCV;  b. CHA2DS2VASc = 6--> on coumadin; c. 10/2015 Echo: sev dil LA.   Prostate cancer ([place])    Pulmonary HTN ([place])    Pulmonary hypertension ([place])    a. 10/2015 Echo: PASP 67mmHg.   Renal insufficiency    Severe mitral regurgitation    a.  10/2015 s/p  minimally invasive MV repair; b. 10/2015 Echo: mod MS, mean grad 103mmHg; c. 02/2017 Echo: EF 60-65%, Gr3 DD, mod Ca2+ MV annulus, mod to sev dil LA, mod dil RA, mod TR. PASP 63mmHg.   Spinal stenosis    SVT (supraventricular tachycardia) ([facility])    a. recurrent, usually responsive to vagal manuevers   Tricuspid regurgitation    a. 10/2015 Echo: mild to mod TR.   Past Surgical History:  Procedure Laterality Date   BACK SURGERY     Back surgery x 2     CARDIAC CATHETERIZATION N/A 08/08/2015   Procedure: Right/Left Heart Cath and Coronary Angiography;  Surgeon: Sherren Mocha, MD;  Location: [facility];  Service: Cardiovascular;  Laterality: N/A;   CARDIAC SURGERY     2 cardiac stents   CARDIOVERSION N/A 10/20/2015   Procedure: CARDIOVERSION;  Surgeon: Pixie Casino, MD;  Location: [facility] ENDOSCOPY;  Service: Cardiovascular;  Laterality: N/A;   CERVICAL SPINE SURGERY     CHOLECYSTECTOMY N/A 07/08/2016   Procedure: LAPAROSCOPIC CHOLECYSTECTOMY WITH INTRAOPERATIVE CHOLANGIOGRAM;  Surgeon: Georganna Skeans,  MD;  Location: [facility];  Service: General;  Laterality: N/A;   CORONARY ARTERY BYPASS GRAFT     ESOPHAGOGASTRODUODENOSCOPY (EGD) WITH PROPOFOL N/A 03/19/2018   Procedure: ESOPHAGOGASTRODUODENOSCOPY (EGD) WITH PROPOFOL;  Surgeon: [name], MD;  Location: [facility] ENDOSCOPY;  Service: Gastroenterology;  Laterality: N/A;   EYE SURGERY     INTRAOPERATIVE TRANSESOPHAGEAL ECHOCARDIOGRAM  10/05/2015   Procedure: INTRAOPERATIVE TRANSESOPHAGEAL ECHOCARDIOGRAM;  Surgeon: Rexene Alberts, MD;  Location: [place];  Service: Open Heart Surgery;;   JOINT REPLACEMENT     shoulder   LEFT HEART CATHETERIZATION WITH CORONARY ANGIOGRAM N/A 11/16/2014   Procedure: LEFT HEART CATHETERIZATION WITH CORONARY ANGIOGRAM;  Surgeon: Troy Sine, MD;  Location: [facility] CATH LAB;  Service: Cardiovascular;  Laterality: N/A;   MITRAL VALVE REPAIR Right 10/04/2015   Procedure: MINIMALLY INVASIVE MITRAL VALVE REPAIR (MVR);   Surgeon: Rexene Alberts, MD;  Location: [place];  Service: Open Heart Surgery;  Laterality: Right;   Rt rotator cuff repair     TEE WITHOUT CARDIOVERSION N/A 06/22/2015   Procedure: TRANSESOPHAGEAL ECHOCARDIOGRAM (TEE);  Surgeon: Skeet Latch, MD;  Location: [facility];  Service: Cardiovascular;  Laterality: N/A;   TEE WITHOUT CARDIOVERSION N/A 10/04/2015   Procedure: TRANSESOPHAGEAL ECHOCARDIOGRAM (TEE);  Surgeon: Rexene Alberts, MD;  Location: [place];  Service: Open Heart Surgery;  Laterality: N/A;   WOUND EXPLORATION N/A 10/05/2015   Procedure: Sternal Incision;  Surgeon: Rexene Alberts, MD;  Location: [place];  Service: Open Heart Surgery;  Laterality: N/A;   Current Outpatient Medications on File Prior to Visit  Medication Sig Dispense Refill   allopurinol (ZYLOPRIM) 100 MG tablet TAKE ONE (1) TABLET EACH DAY 90 tablet 2   bumetanide (BUMEX) 2 MG tablet Take 2 tablets (4 mg total) by mouth 2 (two) times daily. 180 tablet 0   cyclobenzaprine (FLEXERIL) 5 MG tablet Take 1 tablet (5 mg total) by mouth 3 (three) times daily as needed for muscle spasms. 30 tablet 0   docusate sodium (COLACE) 100 MG capsule Take 1 capsule (100 mg total) by mouth daily as needed for moderate constipation. 60 capsule 2   finasteride (PROSCAR) 5 MG tablet Take 1 tablet (5 mg total) by mouth at bedtime. 90 tablet 2   loratadine (CLARITIN) 10 MG tablet Take 1 tablet (10 mg total) by mouth daily as needed for allergies. 30 tablet 3   metoprolol tartrate (LOPRESSOR) 25 MG tablet Take 0.5 tablets (12.5 mg total) by mouth 2 (two) times daily. 90 tablet 3   OVER THE COUNTER MEDICATION [place]: Take 1 capsule by mouth once a day for immune health     pravastatin (PRAVACHOL) 40 MG tablet TAKE 1 TABLET BY MOUTH EVERY DAY AT 6PM 30 tablet 9   spironolactone (ALDACTONE) 25 MG tablet Take 1 tablet (25 mg total) by mouth daily. 90 tablet 3   traMADol (ULTRAM) 50 MG tablet TAKE ONE TABLET BY MOUTH EVERY 6 HOURS  AS NEEDED FOR MODERATE PAIN 30 tablet 0   triamcinolone cream (KENALOG) 0.1 % Apply 1 application topically 2 (two) times daily. 30 g 0   warfarin (COUMADIN) 4 MG tablet TAKE 1 TABLET(4 MG TOTAL) BY MOUTH DAILY. DO NOT TAKE WITH ANY OTHER WARFARIN UNLESS DISCARD REMAINDER. Sherion Dooly INSTRUCTS. 30 tablet 3   No current facility-administered medications on file prior to visit.    No Known Allergies Social History   Socioeconomic History   Marital status: Married    Spouse name: Not on file   Number of  children: 4   Years of education: Not on file   Highest education level: Not on file  Occupational History    Employer: RETIRED  Social Needs   Financial resource strain: Not on file   Food insecurity:    Worry: Not on file    Inability: Not on file   Transportation needs:    Medical: Not on file    Non-medical: Not on file  Tobacco Use   Smoking status: Former Smoker    Types: Cigarettes, Pipe, Cigars   Smokeless tobacco: Former Systems developer    Types: Chew   Tobacco comment: [place] YEARS AGO"  Substance and Sexual Activity   Alcohol use: No   Drug use: No   Sexual activity: Not on file  Lifestyle   Physical activity:    Days per week: Not on file    Minutes per session: Not on file   Stress: Not on file  Relationships   Social connections:    Talks on phone: Not on file    Gets together: Not on file    Attends religious service: Not on file    Active member of club or organization: Not on file    Attends meetings of clubs or organizations: Not on file    Relationship status: Not on file   Intimate partner violence:    Fear of current or ex partner: Not on file    Emotionally abused: Not on file    Physically abused: Not on file    Forced sexual activity: Not on file  Other Topics Concern   Not on file  Social History Narrative   ** Merged History Encounter **       Four adopted children.  Lives alone.       Review of Systems  All other  systems reviewed and are negative.      Objective:   Physical Exam  Constitutional: He appears well-developed and well-nourished. No distress.  Cardiovascular: Normal rate. An irregularly irregular rhythm present.  Murmur heard. Pulmonary/Chest: Effort normal. No respiratory distress. He has no wheezes. He has no rhonchi. He has no rales.  Abdominal: Soft. Bowel sounds are normal. He exhibits no distension. There is no abdominal tenderness. There is no rebound.  Musculoskeletal:        General: Edema present.     Right lower leg: Edema present.     Left lower leg: Edema present.  Skin: He is not diaphoretic.  Vitals reviewed.  He has poor balance, now in wheelchair    Assessment & Plan:  Leg swelling  Chronic diastolic CHF (congestive heart failure) ([place])  Patient was placed in Unna boots bilaterally as before to control the swelling in his legs.  We will change the dressings on Monday.  Until the home health nurses can come out to his house and start doing these once a week, we will have to perform the service here in order to control the swelling in his legs is neither the patient or his wife are able to apply compression hose and without compression therapy, his legs quickly swell.

## 2018-11-02 ENCOUNTER — Ambulatory Visit (INDEPENDENT_AMBULATORY_CARE_PROVIDER_SITE_OTHER): Payer: Medicare Other | Admitting: Family Medicine

## 2018-11-02 ENCOUNTER — Other Ambulatory Visit: Payer: Self-pay

## 2018-11-02 ENCOUNTER — Ambulatory Visit (HOSPITAL_COMMUNITY)
Admission: RE | Admit: 2018-11-02 | Discharge: 2018-11-02 | Disposition: A | Discharge status: Home / Self Care | Payer: Medicare Other | Source: Ambulatory Visit | Attending: Family Medicine | Admitting: Family Medicine

## 2018-11-02 ENCOUNTER — Encounter: Payer: Self-pay | Admitting: Family Medicine

## 2018-11-02 VITALS — BP 96/60 | HR 88 | Temp 98.4°F | Resp 18 | Wt 216.0 lb

## 2018-11-02 DIAGNOSIS — M25551 Pain in right hip: Secondary | ICD-10-CM | POA: Diagnosis not present

## 2018-11-02 DIAGNOSIS — M79604 Pain in right leg: Secondary | ICD-10-CM | POA: Diagnosis not present

## 2018-11-02 DIAGNOSIS — M7989 Other specified soft tissue disorders: Secondary | ICD-10-CM

## 2018-11-02 DIAGNOSIS — I82431 Acute embolism and thrombosis of right popliteal vein: Secondary | ICD-10-CM | POA: Diagnosis not present

## 2018-11-02 NOTE — Progress Notes (Addendum)
Subjective:    Patient ID: [name], male    DOB: 10/11/1928, 85 y.o.   MRN: [id number]  HPI   09/10/18 I reviewed the patient's recent visit with his cardiologist.  Since that time, his legs have started to swell dramatically.  He has +2 pitting edema in his right leg up to his knee.  He is unable to wear his compression hose due to the swelling.  He also has +2 pitting edema in his left leg up to his knee.  His legs are so swollen that they hurt.  He also reports dyspnea with exertion which is mild.  He denies any chest pain.  He denies any syncope.  He denies any lightheadedness.  His blood pressure today however is extremely soft at 88/60.  I confirmed this personally.  However he has bibasilar crackles in both lungs dusting fluid overload despite his weight essentially remaining stable.  At that time, my plan was: Patient's exam today is markedly different than his exam in early January.  He now has pitting edema and rales on pulmonary exam.  He appears fluid overloaded today.  Furthermore his blood pressure is low.  We discussed going to the hospital for diuresis.  The patient feels fine other than pain in his legs due to the swelling and mild shortness of breath with activity.  He wants to avoid the hospital due to the exposure to flu and other illnesses which I agree would likely kill him if he contracted a secondary infection.  Therefore we will discontinue metoprolol to avoid hypotension after diuresis and increase his Lasix to 3 pills twice daily.  I will see the patient back first thing tomorrow.  Consider using compression Unna boots tomorrow if necessary to help control the swelling.  Explained to the patient and his wife that I am concerned he may be showing worsening signs of congestive heart failure and that this could be a sign that his condition is becoming more serious and potentially life-threatening.  Patient understands and elects to go home.  He will follow-up with me tomorrow  and we will attempt outpatient diuresis.  Consider hospice referral if the patient continues to deteriorate  09/11/18 Patient's blood pressure is now improved discontinue metoprolol.  However despite increasing Lasix there has not been substantial improvement in the swelling in his legs.  I believe the patient is more third spacing then fluid overloaded.  Because his legs are swollen so bad he is unable to wear his compression hose and therefore the third spacing and pitting edema in his legs continues to worsen.  He denies any dizziness or syncope or chest pain or shortness of breath.  At that time, my plan was: Continue Lasix 3 tablets twice daily however I placed the patient in bilateral Unna boots today in an effort to help with the third spacing to drive the pitting edema from the interstitial tissues back into the blood vessels where the diuretics can be more effective.  We will see the patient back on Monday to remove the Unna boots and at that time we will switch him back to his compression hose if the swelling has improved.  Resume previous dose of Lasix, 2 pills twice a day on Sunday to avoid dehydration.  If swelling is better Monday, will resume the metoprolol at that time after we have decreased his Lasix  09/14/18 Patient is here today to follow-up.  I removed his Unna boots.  The swelling had improved dramatically in  both feet and is down to normal now.  Patient brings in a brand-new pair of compression hose for me to apply to his legs today.  I was able to put the patient back into his compression support hose today after I remove the Unna boots.  His blood pressure is also better and now appears stable to resume metoprolol especially given that he is no longer taking the elevated dose of Lasix.  At that time, my plan was: I remove the patient's Unna boots.  He was placed in compression hose bilaterally.  Continue to wear compression hose on a daily basis to control swelling.  Resume previous  dose of Lasix 80 mg twice daily.Marland Kitchen  Resume metoprolol 12.5 mg p.o. twice daily  10/08/18 Wt Readings from Last 3 Encounters:  11/02/18 216 lb (98 kg)  10/28/18 212 lb (96.2 kg)  10/19/18 204 lb (92.5 kg)   Patient presents today with swelling in both legs right greater than left.  He has +2 pitting edema in his right leg and +1 pitting edema in his left leg up to his knee.  Patient denies any chest pain.  He has chronic shortness of breath is no different from his baseline.  Weight is increased 4 pounds since 1 month ago.  He is taking lasix 120 mg in the morning and 80 mg in the evening and has not achieved any successful diuresis.  He has stopped wearing compression hose due to the pain in his legs.  Stopped the compression hose the day after I last saw him.  AT that time, my plan was: Discontinue Lasix and switch to Bumex 2 mg p.o. twice daily.  Recheck via telephone tomorrow.  Will increase to 4 mg p.o. twice daily if the patient has not experienced any diuresis and will recheck the patient on Monday or sooner if worse.  Continue current potassium dose  10/12/18 Patient has seen no benefit since switching from Lasix to Bumex 2 mg twice daily.  He continues to have significant edema in his right leg greater than his left leg.  It is still tender to palpation in both his right and left leg.  He also has dyspnea on exertion and he has left basilar crackles appreciated on today's exam.  At that time, my plan was: Increase Bumex to 4 mg twice daily.  He is not on any potassium replacement despite this being on his medication list.  His wife states that he is not taking any potassium.  Therefore I will check his BMP and adjust his potassium replacement if necessary.  Reassess on Thursday.  While the patient was here, I checked his INR.  INR is therapeutic at 2.6 with a PTT of 31.7.  We will make no changes in his Coumadin dose.  Reassess via telephone on Thursday.  10/15/18 Unfortunately, leg swelling does  not seem any better.  In fact it appears to be worse.  Pitting edema in the right leg is +2.  Now pitting edema in the left leg is approaching +2 all the way up to just below the knee.  The patient's skin hurts simply from the stretch.  He reports aching and throbbing in his legs.  Yesterday was particularly bad.  Today is a little bit better.  Unfortunately he is unable to wear any compression hose to prevent third spacing.  He is no longer eating very well.  He is only drinking water.  I believe the protein calorie malnutrition is likely decreasing the intravascular  oncotic pressure and leading to worsening third spacing.  On his most recent BMP, his renal function was deteriorating showing depleted intravascular volume.  Therefore I am unable to increase his diuretic dose to improve the third spacing.  At that time, my plan was: Patient has significant leg swelling due to chronic venous insufficiency as well as chronic diastolic congestive heart failure.  Now due to protein calorie malnutrition, he is losing intravascular oncotic pressure contributing to the third spacing and worsening the edema.  He is unable to wear compression hose at home which is only exacerbating the problem and oral diuretics are approaching prerenal azotemia.  Therefore I placed the patient in bilateral Unna boots today and will recheck the patient on Monday.  If this is helping, I will start having home health nursing apply the Unna boots once a week for 3 or 4 days and then remove the Unna boots in an effort to try to maintain and control his leg swelling.  Patient is unable to wear compression hose but we need to do some type of compressive therapy to help manage and control his leg swelling.  He has current diuretic dose.  Encouraged protein supplement such as Ensure 2 to 3 cans a day due to his poor appetite.  10/19/18 Swelling has improved dramatically in both legs.  He has no edema distal to his knee and his left leg.  There is  some +1 pitting edema in his foot past the ankle down around the toes and the dorsal forefoot.  He has trace to +1 edema in his right leg distal to the knee.  He has +1 edema in the forefoot past the ankle.  Legs feel much better.  He is lost 2 pounds.  Unna boots were removed today.  However the patient is unable to change his compression hose.  His wife is not strong enough to put his compression hose on.  The patient is not strong enough to put his compression hose on.  Without compression therapy, the swelling will quickly return.  I explained this to the patient in detail.  I believe he would benefit from having home health nurse come out once a week and placing him in an Unna boot and then removing it 3 days later and then going 3 days without the Unna boot to try to moderate and control the swelling.  Patient is willing to try this.  At that time, my plan was: Leg swelling has improved dramatically status post Unna boots.  These were removed today.  However as I explained to the patient, the swelling will rapidly return without some type of compressive therapy.  He is not physically able to put on compression hose.  His wife is not physically able to put on compression hose.  They simply remove the socks usually after 1 day and then never put them on again.  Diuretics alone are unable to control swelling.  Therefore I have recommended home health come to his house and apply the Unna boots once a week for approximately 3 days and then remove the Unna boots.  Perhaps we do this weekly we can control swelling.  Patient could also come here to the office however is a tremendous hardship for his wife as the patient is barely able to stand and she is not strong enough to get him in and out of the wheelchair at home.  Therefore I will see if home health can come do this to help manage the leg  swelling.  The pain in his legs have improved dramatically now that we have improved the edema  10/28/18 Home health  nurses have not been able to come out to his home due to the coronavirus pandemic.  Patient is unable to wear compression hose.  As result, as anticipated, the swelling has quickly returned to both of his legs.  He has +2 pitting edema in his right leg up to his knee.  At times his leg is weeping from shallow small lesions on the anterior surface of his shin.  He has +1 pitting edema in his left leg up to his knee.  Both legs are aching due to the swelling and the pain.  At that time, my plan was: Patient was placed in Unna boots bilaterally as before to control the swelling in his legs.  We will change the dressings on Monday.  Until the home health nurses can come out to his house and start doing these once a week, we will have to perform the service here in order to control the swelling in his legs is neither the patient or his wife are able to apply compression hose and without compression therapy, his legs quickly swell.  11/02/18 Patient was originally scheduled today to have the Unna boot replaced on his right leg.  However over the weekend he developed severe pain in his right leg.  History is difficult to obtain due to the patient's manner of speech.  However as best I can ascertain, the patient has had severe pain beginning in the posterior aspect of his right hip radiating down his leg into his right lower leg.  The compression wraps made the pain worse per tickly distal to the knee.  There was so much swelling in his right leg that his wife had to remove the compression wrap from his right leg on Sunday as his leg was swelling above the compression wrap causing significant pain.  There is +2 edema in the right leg today from his ankle all the way up to above his knee.  However he is also having sharp severe pain in his right posterior hip.  He denies any falls or injuries.  He is unable to characterize the pain is burning or radiating or shooting.  He describes it more as a stabbing pain.  It hurts  for me to palpation his posterior iliac crest.  He also has tenderness to palpation over the greater trochanter in the right hip.  He has a positive Homans sign today he also has pain with compression of his upper thigh.  Pain is significant enough that it hurts to bear weight all the way down his leg  Past Medical History:  Diagnosis Date   Atelectasis of right lung    Chronic diastolic (congestive) heart failure ([place])    a. 10/2013 Echo: EF 55-60%, basal-mid anteroseptal and basal-mid inferoseptal HK; b. 02/2017 Echo: EF 60-65%, Gr3 DD.   Chronic fatigue    Chronic subdural hematoma ([place])    a. 12/2015 Head CT: chronic right holo-hemispheric SDH w/o midline shift.   CKD (chronic kidney disease), stage III ([place])    Coronary artery disease    a. 11/2014 NSTEMI: DESx 2 placed to LAD and RCA; b. 08/2015 Cath: LAD patent stent, LCX 75m, RCA patent stent, 75m.   Diabetes mellitus, type II ([place])    a. HgA1c 6.8 in 03/2015   ED (erectile dysfunction)    Gout    Hemangioma of liver 08/02/2015  12 mm enhancing lesion seen on CT - suspicious for benign hemangioma but not diagnostic - MRI recommended   HLD (hyperlipidemia)    Hypertension    Hypertensive heart disease    Ischemic finger ulcer ([place])    a. 03/2017 Seen for digital ulcers - felt to be embolic in setting of PAF. Pt on coumadin.   Kidney stones    Lower extremity edema    a. chronic   OA (osteoarthritis)    Orthostatic hypotension    PAF (paroxysmal atrial fibrillation) ([place])    a. 10/2015 s/p DCCV;  b. CHA2DS2VASc = 6--> on coumadin; c. 10/2015 Echo: sev dil LA.   Prostate cancer ([place])    Pulmonary HTN ([place])    Pulmonary hypertension ([place])    a. 10/2015 Echo: PASP 62mmHg.   Renal insufficiency    Severe mitral regurgitation    a.  10/2015 s/p minimally invasive MV repair; b. 10/2015 Echo: mod MS, mean grad 21mmHg; c. 02/2017 Echo: EF 60-65%, Gr3 DD, mod Ca2+ MV annulus, mod to sev dil LA, mod dil RA, mod TR. PASP  69mmHg.   Spinal stenosis    SVT (supraventricular tachycardia) ([facility])    a. recurrent, usually responsive to vagal manuevers   Tricuspid regurgitation    a. 10/2015 Echo: mild to mod TR.   Past Surgical History:  Procedure Laterality Date   BACK SURGERY     Back surgery x 2     CARDIAC CATHETERIZATION N/A 08/08/2015   Procedure: Right/Left Heart Cath and Coronary Angiography;  Surgeon: Sherren Mocha, MD;  Location: [facility];  Service: Cardiovascular;  Laterality: N/A;   CARDIAC SURGERY     2 cardiac stents   CARDIOVERSION N/A 10/20/2015   Procedure: CARDIOVERSION;  Surgeon: Pixie Casino, MD;  Location: [facility];  Service: Cardiovascular;  Laterality: N/A;   CERVICAL SPINE SURGERY     CHOLECYSTECTOMY N/A 07/08/2016   Procedure: LAPAROSCOPIC CHOLECYSTECTOMY WITH INTRAOPERATIVE CHOLANGIOGRAM;  Surgeon: Georganna Skeans, MD;  Location: [facility];  Service: General;  Laterality: N/A;   CORONARY ARTERY BYPASS GRAFT     ESOPHAGOGASTRODUODENOSCOPY (EGD) WITH PROPOFOL N/A 03/19/2018   Procedure: ESOPHAGOGASTRODUODENOSCOPY (EGD) WITH PROPOFOL;  Surgeon: [name], MD;  [facility] ENDOSCOPY;  Service: Gastroenterology;  Laterality: N/A;   EYE SURGERY     INTRAOPERATIVE TRANSESOPHAGEAL ECHOCARDIOGRAM  10/05/2015   Procedure: INTRAOPERATIVE TRANSESOPHAGEAL ECHOCARDIOGRAM;  Surgeon: Rexene Alberts, MD;  Location: [facility];  Service: Open Heart Surgery;;   JOINT REPLACEMENT     shoulder   LEFT HEART CATHETERIZATION WITH CORONARY ANGIOGRAM N/A 11/16/2014   Procedure: LEFT HEART CATHETERIZATION WITH CORONARY ANGIOGRAM;  Surgeon: Troy Sine, MD;  Location: [facility] C[facility] LAB;  Service: Cardiovascular;  Laterality: N/A;   MITRAL VALVE REPAIR Right 10/04/2015   Procedure: MINIMALLY INVASIVE MITRAL VALVE REPAIR (MVR);  Surgeon: Rexene Alberts, MD;  Location: [place];  Service: Open Heart Surgery;  Laterality: Right;   Rt rotator cuff repair     TEE WITHOUT CARDIOVERSION N/A 06/22/2015    Procedure: TRANSESOPHAGEAL ECHOCARDIOGRAM (TEE);  Surgeon: Skeet Latch, MD;  Location: [place];  Service: Cardiovascular;  Laterality: N/A;   TEE WITHOUT CARDIOVERSION N/A 10/04/2015   Procedure: TRANSESOPHAGEAL ECHOCARDIOGRAM (TEE);  Surgeon: Rexene Alberts, MD;  Location: [place];  Service: Open Heart Surgery;  Laterality: N/A;   WOUND EXPLORATION N/A 10/05/2015   Procedure: Sternal Incision;  Surgeon: Rexene Alberts, MD;  Location: [place] [facility];  Service: Open Heart Surgery;  Laterality: N/A;  Current Outpatient Medications on File Prior to Visit  Medication Sig Dispense Refill   allopurinol (ZYLOPRIM) 100 MG tablet TAKE ONE (1) TABLET EACH DAY 90 tablet 2   bumetanide (BUMEX) 2 MG tablet Take 2 tablets (4 mg total) by mouth 2 (two) times daily. 180 tablet 0   cyclobenzaprine (FLEXERIL) 5 MG tablet Take 1 tablet (5 mg total) by mouth 3 (three) times daily as needed for muscle spasms. 30 tablet 0   docusate sodium (COLACE) 100 MG capsule Take 1 capsule (100 mg total) by mouth daily as needed for moderate constipation. 60 capsule 2   finasteride (PROSCAR) 5 MG tablet Take 1 tablet (5 mg total) by mouth at bedtime. 90 tablet 2   loratadine (CLARITIN) 10 MG tablet Take 1 tablet (10 mg total) by mouth daily as needed for allergies. 30 tablet 3   metoprolol tartrate (LOPRESSOR) 25 MG tablet Take 0.5 tablets (12.5 mg total) by mouth 2 (two) times daily. 90 tablet 3   OVER THE COUNTER MEDICATION Neuroquell: Take 1 capsule by mouth once a day for immune health     pravastatin (PRAVACHOL) 40 MG tablet TAKE 1 TABLET BY MOUTH EVERY DAY AT 6PM 30 tablet 9   spironolactone (ALDACTONE) 25 MG tablet Take 1 tablet (25 mg total) by mouth daily. 90 tablet 3   traMADol (ULTRAM) 50 MG tablet TAKE ONE TABLET BY MOUTH EVERY 6 HOURS AS NEEDED FOR MODERATE PAIN 30 tablet 0   triamcinolone cream (KENALOG) 0.1 % Apply 1 application topically 2 (two) times daily. 30 g 0   warfarin (COUMADIN) 4 MG tablet  TAKE 1 TABLET(4 MG TOTAL) BY MOUTH DAILY. DO NOT TAKE WITH ANY OTHER WARFARIN UNLESS DISCARD REMAINDER. Yaneliz Radebaugh INSTRUCTS. 30 tablet 3   No current facility-administered medications on file prior to visit.    No Known Allergies Social History   Socioeconomic History   Marital status: Married    Spouse name: Not on file   Number of children: 4   Years of education: Not on file   Highest education level: Not on file  Occupational History    Employer: RETIRED  Social Needs   Financial resource strain: Not on file   Food insecurity:    Worry: Not on file    Inability: Not on file   Transportation needs:    Medical: Not on file    Non-medical: Not on file  Tobacco Use   Smoking status: Former Smoker    Types: Cigarettes, Pipe, Cigars   Smokeless tobacco: Former Systems developer    Types: Chew   Tobacco comment: [place] YEARS AGO"  Substance and Sexual Activity   Alcohol use: No   Drug use: No   Sexual activity: Not on file  Lifestyle   Physical activity:    Days per week: Not on file    Minutes per session: Not on file   Stress: Not on file  Relationships   Social connections:    Talks on phone: Not on file    Gets together: Not on file    Attends religious service: Not on file    Active member of club or organization: Not on file    Attends meetings of clubs or organizations: Not on file    Relationship status: Not on file   Intimate partner violence:    Fear of current or ex partner: Not on file    Emotionally abused: Not on file    Physically abused: Not on file  Forced sexual activity: Not on file  Other Topics Concern   Not on file  Social History Narrative   ** Merged History Encounter **       Four adopted children.  Lives alone.       Review of Systems  All other systems reviewed and are negative.      Objective:   Physical Exam  Constitutional: He appears well-developed and well-nourished. No distress.  Cardiovascular: Normal  rate. An irregularly irregular rhythm present.  Murmur heard. Pulmonary/Chest: Effort normal. No respiratory distress. He has no wheezes. He has no rhonchi. He has no rales.  Abdominal: Soft. Bowel sounds are normal. He exhibits no distension. There is no abdominal tenderness. There is no rebound.  Musculoskeletal:        General: Edema present.     Right hip: He exhibits decreased range of motion, tenderness and bony tenderness. He exhibits no swelling and no deformity.     Right upper leg: He exhibits tenderness. He exhibits no swelling and no edema.     Right lower leg: Edema present.     Left lower leg: Edema present.  Skin: He is not diaphoretic.  Vitals reviewed.      Assessment & Plan:  Right leg pain - Plan: US Venous Img Lower Unilateral Right, DG Hip Unilat W OR W/O Pelvis 2-3 Views Right  Leg swelling - Plan: US Venous Img Lower Unilateral Right, DG Hip Unilat W OR W/O Pelvis 2-3 Views Right  Lungs are relatively clear today.  I do not believe this is fluid overload from congestive heart failure.  Given the severity of the pain in his right leg I am concerned about a hip fracture, lumbar radiculopathy/sciatica, or DVT particular given the significant swelling in his right leg.  I will send the patient urgently for a venous ultrasound to rule out a DVT.  I will also obtain an x-ray of his right hip and pelvis to rule out a hip fracture or other skeletal pathology.  If the x-ray is negative for any hip fracture or bony pathology and the ultrasound is negative for a DVT, I will place the patient on a prednisone taper pack for presumed lumbar radiculopathy and resume compression wraps and diuresis.  Ultrasound confirmed new near total occlusion DVT in the right popliteal vein.  Plan was made to discontinue Coumadin and replace with Eliquis 10 mg twice a day for the first week followed by 5 mg twice daily thereafter.  I will reassess the patient on Thursday.  Patient was given samples of  Eliquis to begin taking immediately.  Also recommended that he elevate his leg as much as possible to allow the swelling to decrease.  Reassess the patient on Thursday.

## 2018-11-05 ENCOUNTER — Other Ambulatory Visit: Payer: Self-pay

## 2018-11-05 ENCOUNTER — Ambulatory Visit (INDEPENDENT_AMBULATORY_CARE_PROVIDER_SITE_OTHER): Payer: Medicare Other | Admitting: Family Medicine

## 2018-11-05 VITALS — BP 92/58 | HR 88 | Temp 97.8°F | Resp 16 | Ht 67.0 in | Wt 210.0 lb

## 2018-11-05 DIAGNOSIS — I82401 Acute embolism and thrombosis of unspecified deep veins of right lower extremity: Secondary | ICD-10-CM

## 2018-11-05 DIAGNOSIS — M25551 Pain in right hip: Secondary | ICD-10-CM | POA: Diagnosis not present

## 2018-11-05 MED ORDER — PREDNISONE 20 MG PO TABS: ORAL_TABLET | ORAL | 0 refills | Status: DC

## 2018-11-05 MED ORDER — APIXABAN 5 MG PO TABS: 5.0000 mg | ORAL_TABLET | Freq: Two times a day (BID) | ORAL | 5 refills | Status: AC

## 2018-11-05 NOTE — Progress Notes (Signed)
Subjective:    Patient ID: [name], male    DOB: 10/11/1928, 85 y.o.   MRN: [id number]  HPI   09/10/18 I reviewed the patient's recent visit with his cardiologist.  Since that time, his legs have started to swell dramatically.  He has +2 pitting edema in his right leg up to his knee.  He is unable to wear his compression hose due to the swelling.  He also has +2 pitting edema in his left leg up to his knee.  His legs are so swollen that they hurt.  He also reports dyspnea with exertion which is mild.  He denies any chest pain.  He denies any syncope.  He denies any lightheadedness.  His blood pressure today however is extremely soft at 88/60.  I confirmed this personally.  However he has bibasilar crackles in both lungs dusting fluid overload despite his weight essentially remaining stable.  At that time, my plan was: Patient's exam today is markedly different than his exam in early January.  He now has pitting edema and rales on pulmonary exam.  He appears fluid overloaded today.  Furthermore his blood pressure is low.  We discussed going to the hospital for diuresis.  The patient feels fine other than pain in his legs due to the swelling and mild shortness of breath with activity.  He wants to avoid the hospital due to the exposure to flu and other illnesses which I agree would likely kill him if he contracted a secondary infection.  Therefore we will discontinue metoprolol to avoid hypotension after diuresis and increase his Lasix to 3 pills twice daily.  I will see the patient back first thing tomorrow.  Consider using compression Unna boots tomorrow if necessary to help control the swelling.  Explained to the patient and his wife that I am concerned he may be showing worsening signs of congestive heart failure and that this could be a sign that his condition is becoming more serious and potentially life-threatening.  Patient understands and elects to go home.  He will follow-up with me tomorrow  and we will attempt outpatient diuresis.  Consider hospice referral if the patient continues to deteriorate  09/11/18 Patient's blood pressure is now improved discontinue metoprolol.  However despite increasing Lasix there has not been substantial improvement in the swelling in his legs.  I believe the patient is more third spacing then fluid overloaded.  Because his legs are swollen so bad he is unable to wear his compression hose and therefore the third spacing and pitting edema in his legs continues to worsen.  He denies any dizziness or syncope or chest pain or shortness of breath.  At that time, my plan was: Continue Lasix 3 tablets twice daily however I placed the patient in bilateral Unna boots today in an effort to help with the third spacing to drive the pitting edema from the interstitial tissues back into the blood vessels where the diuretics can be more effective.  We will see the patient back on Monday to remove the Unna boots and at that time we will switch him back to his compression hose if the swelling has improved.  Resume previous dose of Lasix, 2 pills twice a day on Sunday to avoid dehydration.  If swelling is better Monday, will resume the metoprolol at that time after we have decreased his Lasix  09/14/18 Patient is here today to follow-up.  I removed his Unna boots.  The swelling had improved dramatically in  both feet and is down to normal now.  Patient brings in a brand-new pair of compression hose for me to apply to his legs today.  I was able to put the patient back into his compression support hose today after I remove the Unna boots.  His blood pressure is also better and now appears stable to resume metoprolol especially given that he is no longer taking the elevated dose of Lasix.  At that time, my plan was: I remove the patient's Unna boots.  He was placed in compression hose bilaterally.  Continue to wear compression hose on a daily basis to control swelling.  Resume previous  dose of Lasix 80 mg twice daily.Marland Kitchen  Resume metoprolol 12.5 mg p.o. twice daily  10/08/18 Wt Readings from Last 3 Encounters:  11/02/18 216 lb (98 kg)  10/28/18 212 lb (96.2 kg)  10/19/18 204 lb (92.5 kg)   Patient presents today with swelling in both legs right greater than left.  He has +2 pitting edema in his right leg and +1 pitting edema in his left leg up to his knee.  Patient denies any chest pain.  He has chronic shortness of breath is no different from his baseline.  Weight is increased 4 pounds since 1 month ago.  He is taking lasix 120 mg in the morning and 80 mg in the evening and has not achieved any successful diuresis.  He has stopped wearing compression hose due to the pain in his legs.  Stopped the compression hose the day after I last saw him.  AT that time, my plan was: Discontinue Lasix and switch to Bumex 2 mg p.o. twice daily.  Recheck via telephone tomorrow.  Will increase to 4 mg p.o. twice daily if the patient has not experienced any diuresis and will recheck the patient on Monday or sooner if worse.  Continue current potassium dose  10/12/18 Patient has seen no benefit since switching from Lasix to Bumex 2 mg twice daily.  He continues to have significant edema in his right leg greater than his left leg.  It is still tender to palpation in both his right and left leg.  He also has dyspnea on exertion and he has left basilar crackles appreciated on today's exam.  At that time, my plan was: Increase Bumex to 4 mg twice daily.  He is not on any potassium replacement despite this being on his medication list.  His wife states that he is not taking any potassium.  Therefore I will check his BMP and adjust his potassium replacement if necessary.  Reassess on Thursday.  While the patient was here, I checked his INR.  INR is therapeutic at 2.6 with a PTT of 31.7.  We will make no changes in his Coumadin dose.  Reassess via telephone on Thursday.  10/15/18 Unfortunately, leg swelling does  not seem any better.  In fact it appears to be worse.  Pitting edema in the right leg is +2.  Now pitting edema in the left leg is approaching +2 all the way up to just below the knee.  The patient's skin hurts simply from the stretch.  He reports aching and throbbing in his legs.  Yesterday was particularly bad.  Today is a little bit better.  Unfortunately he is unable to wear any compression hose to prevent third spacing.  He is no longer eating very well.  He is only drinking water.  I believe the protein calorie malnutrition is likely decreasing the intravascular  oncotic pressure and leading to worsening third spacing.  On his most recent BMP, his renal function was deteriorating showing depleted intravascular volume.  Therefore I am unable to increase his diuretic dose to improve the third spacing.  At that time, my plan was: Patient has significant leg swelling due to chronic venous insufficiency as well as chronic diastolic congestive heart failure.  Now due to protein calorie malnutrition, he is losing intravascular oncotic pressure contributing to the third spacing and worsening the edema.  He is unable to wear compression hose at home which is only exacerbating the problem and oral diuretics are approaching prerenal azotemia.  Therefore I placed the patient in bilateral Unna boots today and will recheck the patient on Monday.  If this is helping, I will start having home health nursing apply the Unna boots once a week for 3 or 4 days and then remove the Unna boots in an effort to try to maintain and control his leg swelling.  Patient is unable to wear compression hose but we need to do some type of compressive therapy to help manage and control his leg swelling.  He has current diuretic dose.  Encouraged protein supplement such as Ensure 2 to 3 cans a day due to his poor appetite.  10/19/18 Swelling has improved dramatically in both legs.  He has no edema distal to his knee and his left leg.  There is  some +1 pitting edema in his foot past the ankle down around the toes and the dorsal forefoot.  He has trace to +1 edema in his right leg distal to the knee.  He has +1 edema in the forefoot past the ankle.  Legs feel much better.  He is lost 2 pounds.  Unna boots were removed today.  However the patient is unable to change his compression hose.  His wife is not strong enough to put his compression hose on.  The patient is not strong enough to put his compression hose on.  Without compression therapy, the swelling will quickly return.  I explained this to the patient in detail.  I believe he would benefit from having home health nurse come out once a week and placing him in an Unna boot and then removing it 3 days later and then going 3 days without the Unna boot to try to moderate and control the swelling.  Patient is willing to try this.  At that time, my plan was: Leg swelling has improved dramatically status post Unna boots.  These were removed today.  However as I explained to the patient, the swelling will rapidly return without some type of compressive therapy.  He is not physically able to put on compression hose.  His wife is not physically able to put on compression hose.  They simply remove the socks usually after 1 day and then never put them on again.  Diuretics alone are unable to control swelling.  Therefore I have recommended home health come to his house and apply the Unna boots once a week for approximately 3 days and then remove the Unna boots.  Perhaps we do this weekly we can control swelling.  Patient could also come here to the office however is a tremendous hardship for his wife as the patient is barely able to stand and she is not strong enough to get him in and out of the wheelchair at home.  Therefore I will see if home health can come do this to help manage the leg  swelling.  The pain in his legs have improved dramatically now that we have improved the edema  10/28/18 Home health  nurses have not been able to come out to his home due to the coronavirus pandemic.  Patient is unable to wear compression hose.  As result, as anticipated, the swelling has quickly returned to both of his legs.  He has +2 pitting edema in his right leg up to his knee.  At times his leg is weeping from shallow small lesions on the anterior surface of his shin.  He has +1 pitting edema in his left leg up to his knee.  Both legs are aching due to the swelling and the pain.  At that time, my plan was: Patient was placed in Unna boots bilaterally as before to control the swelling in his legs.  We will change the dressings on Monday.  Until the home health nurses can come out to his house and start doing these once a week, we will have to perform the service here in order to control the swelling in his legs is neither the patient or his wife are able to apply compression hose and without compression therapy, his legs quickly swell.  11/02/18 Patient was originally scheduled today to have the Unna boot replaced on his right leg.  However over the weekend he developed severe pain in his right leg.  History is difficult to obtain due to the patient's manner of speech.  However as best I can ascertain, the patient has had severe pain beginning in the posterior aspect of his right hip radiating down his leg into his right lower leg.  The compression wraps made the pain worse per tickly distal to the knee.  There was so much swelling in his right leg that his wife had to remove the compression wrap from his right leg on Sunday as his leg was swelling above the compression wrap causing significant pain.  There is +2 edema in the right leg today from his ankle all the way up to above his knee.  However he is also having sharp severe pain in his right posterior hip.  He denies any falls or injuries.  He is unable to characterize the pain is burning or radiating or shooting.  He describes it more as a stabbing pain.  It hurts  for me to palpation his posterior iliac crest.  He also has tenderness to palpation over the greater trochanter in the right hip.  He has a positive Homans sign today he also has pain with compression of his upper thigh.  Pain is significant enough that it hurts to bear weight all the way down his leg.  At that time, my plan was: Lungs are relatively clear today.  I do not believe this is fluid overload from congestive heart failure.  Given the severity of the pain in his right leg I am concerned about a hip fracture, lumbar radiculopathy/sciatica, or DVT particular given the significant swelling in his right leg.  I will send the patient urgently for a venous ultrasound to rule out a DVT.  I will also obtain an x-ray of his right hip and pelvis to rule out a hip fracture or other skeletal pathology.  If the x-ray is negative for any hip fracture or bony pathology and the ultrasound is negative for a DVT, I will place the patient on a prednisone taper pack for presumed lumbar radiculopathy and resume compression wraps and diuresis.  Ultrasound confirmed new near  total occlusion DVT in the right popliteal vein.  Plan was made to discontinue Coumadin and replace with Eliquis 10 mg twice a day for the first week followed by 5 mg twice daily thereafter.  I will reassess the patient on Thursday.  Patient was given samples of Eliquis to begin taking immediately.  Also recommended that he elevate his leg as much as possible to allow the swelling to decrease.  Reassess the patient on Thursday.  11/05/18  Patient presents today for follow-up.  He states that the pain in his right leg distal to his right knee has improved after only being on Eliquis for 2 days.  He continues to have extensive +2 edema from his knee to his ankle.  The swelling in his leg has not changed perceptibly over the last 2 days however the patient has seen significant improvement in the pain that was located around his knee and in his upper calf  suggesting that the Eliquis is successfully treating the DVT.  We had discussed using Lovenox instead of Eliquis after he had the results 2 days ago.  Patient had wanted to try Eliquis first.  Given the fact that the pain is improving, together we decided to continue Eliquis indefinitely however we will need to monitor for progression.  If the swelling seems to worsen in his right leg and the pain worsens, I would quickly switch the patient to Lovenox.  I explained the unusual situation that the patient is in and that he formed a DVT while taking Coumadin at apparently a therapeutic dose.  Patient is comfortable continuing Eliquis at the present time.  However I do not believe the pain in his posterior right hip is related to the DVT.  X-ray showed severe degenerative disc disease in his lower back.  The patient continues to have a shooting-like pain in his posterior right hip suggesting lumbar radiculopathy as a likely cause.  There was no significant arthritis or fracture seen on the x-ray of his right hip  Past Medical History:  Diagnosis Date   Atelectasis of right lung    Chronic diastolic (congestive) heart failure ([place])    a. 10/2013 Echo: EF 55-60%, basal-mid anteroseptal and basal-mid inferoseptal HK; b. 02/2017 Echo: EF 60-65%, Gr3 DD.   Chronic fatigue    Chronic subdural hematoma ([place])    a. 12/2015 Head CT: chronic right holo-hemispheric SDH w/o midline shift.   CKD (chronic kidney disease), stage III ([place])    Coronary artery disease    a. 11/2014 NSTEMI: DESx 2 placed to LAD and RCA; b. 08/2015 Cath: LAD patent stent, LCX 5m, RCA patent stent, 74m.   Diabetes mellitus, type II ([place])    a. HgA1c 6.8 in 03/2015   ED (erectile dysfunction)    Gout    Hemangioma of liver 08/02/2015   12 mm enhancing lesion seen on CT - suspicious for benign hemangioma but not diagnostic - MRI recommended   HLD (hyperlipidemia)    Hypertension    Hypertensive heart disease    Ischemic finger  ulcer ([place])    a. 03/2017 Seen for digital ulcers - felt to be embolic in setting of PAF. Pt on coumadin.   Kidney stones    Lower extremity edema    a. chronic   OA (osteoarthritis)    Orthostatic hypotension    PAF (paroxysmal atrial fibrillation) ([place])    a. 10/2015 s/p DCCV;  b. CHA2DS2VASc = 6--> on coumadin; c. 10/2015 Echo: sev dil LA.  Prostate cancer ([place])    Pulmonary HTN ([place])    Pulmonary hypertension ([place])    a. 10/2015 Echo: PASP 40mmHg.   Renal insufficiency    Severe mitral regurgitation    a.  10/2015 s/p minimally invasive MV repair; b. 10/2015 Echo: mod MS, mean grad 84mmHg; c. 02/2017 Echo: EF 60-65%, Gr3 DD, mod Ca2+ MV annulus, mod to sev dil LA, mod dil RA, mod TR. PASP 65mmHg.   Spinal stenosis    SVT (supraventricular tachycardia) (HCC)    a. recurrent, usually responsive to vagal manuevers   Tricuspid regurgitation    a. 10/2015 Echo: mild to mod TR.   Past Surgical History:  Procedure Laterality Date   BACK SURGERY     Back surgery x 2     CARDIAC CATHETERIZATION N/A 08/08/2015   Procedure: Right/Left Heart Cath and Coronary Angiography;  Surgeon: Sherren Mocha, MD;  Location: [place];  Service: Cardiovascular;  Laterality: N/A;   CARDIAC SURGERY     2 cardiac stents   CARDIOVERSION N/A 10/20/2015   Procedure: CARDIOVERSION;  Surgeon: Pixie Casino, MD;  Location: [place];  Service: Cardiovascular;  Laterality: N/A;   CERVICAL SPINE SURGERY     CHOLECYSTECTOMY N/A 07/08/2016   Procedure: LAPAROSCOPIC CHOLECYSTECTOMY WITH INTRAOPERATIVE CHOLANGIOGRAM;  Surgeon: Georganna Skeans, MD;  Location: [facility];  Service: General;  Laterality: N/A;   CORONARY ARTERY BYPASS GRAFT     ESOPHAGOGASTRODUODENOSCOPY (EGD) WITH PROPOFOL N/A 03/19/2018   Procedure: ESOPHAGOGASTRODUODENOSCOPY (EGD) WITH PROPOFOL;  Surgeon: [name], MD;  Location: [facility] ENDOSCOPY;  Service: Gastroenterology;  Laterality: N/A;   EYE SURGERY     INTRAOPERATIVE  TRANSESOPHAGEAL ECHOCARDIOGRAM  10/05/2015   Procedure: INTRAOPERATIVE TRANSESOPHAGEAL ECHOCARDIOGRAM;  Surgeon: Rexene Alberts, MD;  Location: [place];  Service: Open Heart Surgery;;   JOINT REPLACEMENT     shoulder   LEFT HEART CATHETERIZATION WITH CORONARY ANGIOGRAM N/A 11/16/2014   Procedure: LEFT HEART CATHETERIZATION WITH CORONARY ANGIOGRAM;  Surgeon: Troy Sine, MD;  Location: [facility] CATH LAB;  Service: Cardiovascular;  Laterality: N/A;   MITRAL VALVE REPAIR Right 10/04/2015   Procedure: MINIMALLY INVASIVE MITRAL VALVE REPAIR (MVR);  Surgeon: Rexene Alberts, MD;  Location: [place];  Service: Open Heart Surgery;  Laterality: Right;   Rt rotator cuff repair     TEE WITHOUT CARDIOVERSION N/A 06/22/2015   Procedure: TRANSESOPHAGEAL ECHOCARDIOGRAM (TEE);  Surgeon: Skeet Latch, MD;  Location: [place];  Service: Cardiovascular;  Laterality: N/A;   TEE WITHOUT CARDIOVERSION N/A 10/04/2015   Procedure: TRANSESOPHAGEAL ECHOCARDIOGRAM (TEE);  Surgeon: Rexene Alberts, MD;  Location: [place];  Service: Open Heart Surgery;  Laterality: N/A;   WOUND EXPLORATION N/A 10/05/2015   Procedure: Sternal Incision;  Surgeon: Rexene Alberts, MD;  Location: [place];  Service: Open Heart Surgery;  Laterality: N/A;   Current Outpatient Medications on File Prior to Visit  Medication Sig Dispense Refill   allopurinol (ZYLOPRIM) 100 MG tablet TAKE ONE (1) TABLET EACH DAY 90 tablet 2   bumetanide (BUMEX) 2 MG tablet Take 2 tablets (4 mg total) by mouth 2 (two) times daily. 180 tablet 0   cyclobenzaprine (FLEXERIL) 5 MG tablet Take 1 tablet (5 mg total) by mouth 3 (three) times daily as needed for muscle spasms. 30 tablet 0   docusate sodium (COLACE) 100 MG capsule Take 1 capsule (100 mg total) by mouth daily as needed for moderate constipation. 60 capsule 2   finasteride (PROSCAR) 5 MG tablet Take 1 tablet (5  mg total) by mouth at bedtime. 90 tablet 2   loratadine (CLARITIN) 10 MG tablet Take 1 tablet (10 mg  total) by mouth daily as needed for allergies. 30 tablet 3   metoprolol tartrate (LOPRESSOR) 25 MG tablet Take 0.5 tablets (12.5 mg total) by mouth 2 (two) times daily. 90 tablet 3   OVER THE COUNTER MEDICATION Neuroquell: Take 1 capsule by mouth once a day for immune health     pravastatin (PRAVACHOL) 40 MG tablet TAKE 1 TABLET BY MOUTH EVERY DAY AT 6PM 30 tablet 9   spironolactone (ALDACTONE) 25 MG tablet Take 1 tablet (25 mg total) by mouth daily. 90 tablet 3   traMADol (ULTRAM) 50 MG tablet TAKE ONE TABLET BY MOUTH EVERY 6 HOURS AS NEEDED FOR MODERATE PAIN 30 tablet 0   triamcinolone cream (KENALOG) 0.1 % Apply 1 application topically 2 (two) times daily. 30 g 0   warfarin (COUMADIN) 4 MG tablet TAKE 1 TABLET(4 MG TOTAL) BY MOUTH DAILY. DO NOT TAKE WITH ANY OTHER WARFARIN UNLESS DISCARD REMAINDER. Keyan Folson INSTRUCTS. 30 tablet 3   No current facility-administered medications on file prior to visit.    No Known Allergies Social History   Socioeconomic History   Marital status: Married    Spouse name: Not on file   Number of children: 4   Years of education: Not on file   Highest education level: Not on file  Occupational History    Employer: RETIRED  Social Needs   Financial resource strain: Not on file   Food insecurity:    Worry: Not on file    Inability: Not on file   Transportation needs:    Medical: Not on file    Non-medical: Not on file  Tobacco Use   Smoking status: Former Smoker    Types: Cigarettes, Pipe, Cigars   Smokeless tobacco: Former Systems developer    Types: Chew   Tobacco comment: [place] YEARS AGO"  Substance and Sexual Activity   Alcohol use: No   Drug use: No   Sexual activity: Not on file  Lifestyle   Physical activity:    Days per week: Not on file    Minutes per session: Not on file   Stress: Not on file  Relationships   Social connections:    Talks on phone: Not on file    Gets together: Not on file    Attends  religious service: Not on file    Active member of club or organization: Not on file    Attends meetings of clubs or organizations: Not on file    Relationship status: Not on file   Intimate partner violence:    Fear of current or ex partner: Not on file    Emotionally abused: Not on file    Physically abused: Not on file    Forced sexual activity: Not on file  Other Topics Concern   Not on file  Social History Narrative   ** Merged History Encounter **       Four adopted children.  Lives alone.       Review of Systems  All other systems reviewed and are negative.      Objective:   Physical Exam  Constitutional: He appears well-developed and well-nourished. No distress.  Cardiovascular: Normal rate. An irregularly irregular rhythm present.  Murmur heard. Pulmonary/Chest: Effort normal. No respiratory distress. He has no wheezes. He has no rhonchi. He has no rales.  Abdominal: Soft. Bowel sounds are normal. He  exhibits no distension. There is no abdominal tenderness. There is no rebound.  Musculoskeletal:        General: Edema present.     Right upper leg: He exhibits tenderness. He exhibits no swelling and no edema.     Right lower leg: Edema present.     Left lower leg: Edema present.  Skin: He is not diaphoretic.  Vitals reviewed.      Assessment & Plan:  Acute deep vein thrombosis (DVT) of right lower extremity, unspecified vein (HCC)  Right hip pain  I believe the pain and the swelling in the patient's right leg from his knee to his foot was due to the acute DVT in his right leg.  We will continue Eliquis and ultimately start the patient on 5 mg twice daily indefinitely.  If the swelling worsens or the pain worsens, I would switch the patient to Lovenox 80 mg subcu twice daily indefinitely.  I explained in detail the unique situation to the patient and his wife and they are comfortable with this plan especially given how much the pain has improved over the last 2  days since starting Eliquis.  I believe the pain in his posterior right hip is likely due to lumbar radiculopathy given the x-ray findings.  Therefore we will try the patient on a prednisone taper pack as he is unable to take NSAIDs due to his anticoagulation.  He can use tramadol for breakthrough pain.  I suspect that the swelling in his leg will gradually improve over the next 1 to 2 months as the DVT begins to recanalize.  Recheck in 3 months or as needed

## 2018-11-06 ENCOUNTER — Telehealth: Payer: Self-pay | Admitting: Family Medicine

## 2018-11-06 NOTE — Telephone Encounter (Signed)
Aware that is a fairly good price for that mediation but they could call the patient assistance to see if he would qualifiy for assistance with that medication. LMOVM explaining all of this.

## 2018-11-06 NOTE — Telephone Encounter (Signed)
That will be the cheapest it can be found.  Can they apply for financial assistance through the manufacturer?

## 2018-11-06 NOTE — Telephone Encounter (Signed)
Patients wife betty calling from instruction from dr pickard letting you guys know how much his medication was it was 47.00, please call back and let her know if can be gotten for any cheaper

## 2018-11-12 ENCOUNTER — Telehealth: Payer: Self-pay | Admitting: Family Medicine

## 2018-11-12 NOTE — Telephone Encounter (Signed)
Left message return call

## 2018-11-12 NOTE — Telephone Encounter (Addendum)
nicole a nurse from [facility] regarding this patient getting home health  Please call her back at [phone] ext [phone]

## 2018-11-16 ENCOUNTER — Telehealth: Payer: Self-pay | Admitting: Family Medicine

## 2018-11-16 NOTE — Telephone Encounter (Signed)
Pt has no fever or SOB other then with the cough.

## 2018-11-16 NOTE — Telephone Encounter (Signed)
[employer]BS tomorrow is fine

## 2018-11-16 NOTE — Telephone Encounter (Signed)
-----   Message from [place] sent at 11/16/2018  9:14 AM EDT ----- [phone]  Wife betty calling to say that [name] is coughing so bad, says it sounds like he has pneumonia Would like a call back to see if he needs to come in

## 2018-11-17 ENCOUNTER — Other Ambulatory Visit: Payer: Self-pay

## 2018-11-17 ENCOUNTER — Ambulatory Visit (INDEPENDENT_AMBULATORY_CARE_PROVIDER_SITE_OTHER): Payer: Medicare Other | Admitting: Family Medicine

## 2018-11-17 VITALS — BP 100/68 | HR 60 | Temp 98.2°F | Resp 14 | Ht 67.0 in | Wt 210.0 lb

## 2018-11-17 DIAGNOSIS — R059 Cough, unspecified: Secondary | ICD-10-CM

## 2018-11-17 DIAGNOSIS — R05 Cough: Secondary | ICD-10-CM

## 2018-11-17 DIAGNOSIS — M7989 Other specified soft tissue disorders: Secondary | ICD-10-CM

## 2018-11-17 MED ORDER — BENZONATATE 200 MG PO CAPS: 200.0000 mg | ORAL_CAPSULE | Freq: Two times a day (BID) | ORAL | 2 refills | Status: DC | PRN

## 2018-11-17 NOTE — Telephone Encounter (Signed)
Pt coming in to be seen today ?

## 2018-11-17 NOTE — Progress Notes (Signed)
Subjective:    Patient ID: [name], male    DOB: [birth year], 85 y.o.   MRN: [id number]  HPI   09/10/18 I reviewed the patient's recent visit with his cardiologist.  Since that time, his legs have started to swell dramatically.  He has +2 pitting edema in his right leg up to his knee.  He is unable to wear his compression hose due to the swelling.  He also has +2 pitting edema in his left leg up to his knee.  His legs are so swollen that they hurt.  He also reports dyspnea with exertion which is mild.  He denies any chest pain.  He denies any syncope.  He denies any lightheadedness.  His blood pressure today however is extremely soft at 88/60.  I confirmed this personally.  However he has bibasilar crackles in both lungs dusting fluid overload despite his weight essentially remaining stable.  At that time, my plan was: Patient's exam today is markedly different than his exam in early January.  He now has pitting edema and rales on pulmonary exam.  He appears fluid overloaded today.  Furthermore his blood pressure is low.  We discussed going to the hospital for diuresis.  The patient feels fine other than pain in his legs due to the swelling and mild shortness of breath with activity.  He wants to avoid the hospital due to the exposure to flu and other illnesses which I agree would likely kill him if he contracted a secondary infection.  Therefore we will discontinue metoprolol to avoid hypotension after diuresis and increase his Lasix to 3 pills twice daily.  I will see the patient back first thing tomorrow.  Consider using compression Unna boots tomorrow if necessary to help control the swelling.  Explained to the patient and his wife that I am concerned he may be showing worsening signs of congestive heart failure and that this could be a sign that his condition is becoming more serious and potentially life-threatening.  Patient understands and elects to go home.  He will follow-up with me tomorrow  and we will attempt outpatient diuresis.  Consider hospice referral if the patient continues to deteriorate  09/11/18 Patient's blood pressure is now improved discontinue metoprolol.  However despite increasing Lasix there has not been substantial improvement in the swelling in his legs.  I believe the patient is more third spacing then fluid overloaded.  Because his legs are swollen so bad he is unable to wear his compression hose and therefore the third spacing and pitting edema in his legs continues to worsen.  He denies any dizziness or syncope or chest pain or shortness of breath.  At that time, my plan was: Continue Lasix 3 tablets twice daily however I placed the patient in bilateral Unna boots today in an effort to help with the third spacing to drive the pitting edema from the interstitial tissues back into the blood vessels where the diuretics can be more effective.  We will see the patient back on Monday to remove the Unna boots and at that time we will switch him back to his compression hose if the swelling has improved.  Resume previous dose of Lasix, 2 pills twice a day on Sunday to avoid dehydration.  If swelling is better Monday, will resume the metoprolol at that time after we have decreased his Lasix  09/14/18 Patient is here today to follow-up.  I removed his Unna boots.  The swelling had improved dramatically in  both feet and is down to normal now.  Patient brings in a brand-new pair of compression hose for me to apply to his legs today.  I was able to put the patient back into his compression support hose today after I remove the Unna boots.  His blood pressure is also better and now appears stable to resume metoprolol especially given that he is no longer taking the elevated dose of Lasix.  At that time, my plan was: I remove the patient's Unna boots.  He was placed in compression hose bilaterally.  Continue to wear compression hose on a daily basis to control swelling.  Resume previous  dose of Lasix 80 mg twice daily.Marland Kitchen  Resume metoprolol 12.5 mg p.o. twice daily  10/08/18 Wt Readings from Last 3 Encounters:  11/05/18 210 lb (95.3 kg)  11/02/18 216 lb (98 kg)  10/28/18 212 lb (96.2 kg)   Patient presents today with swelling in both legs right greater than left.  He has +2 pitting edema in his right leg and +1 pitting edema in his left leg up to his knee.  Patient denies any chest pain.  He has chronic shortness of breath is no different from his baseline.  Weight is increased 4 pounds since 1 month ago.  He is taking lasix 120 mg in the morning and 80 mg in the evening and has not achieved any successful diuresis.  He has stopped wearing compression hose due to the pain in his legs.  Stopped the compression hose the day after I last saw him.  AT that time, my plan was: Discontinue Lasix and switch to Bumex 2 mg p.o. twice daily.  Recheck via telephone tomorrow.  Will increase to 4 mg p.o. twice daily if the patient has not experienced any diuresis and will recheck the patient on Monday or sooner if worse.  Continue current potassium dose  10/12/18 Patient has seen no benefit since switching from Lasix to Bumex 2 mg twice daily.  He continues to have significant edema in his right leg greater than his left leg.  It is still tender to palpation in both his right and left leg.  He also has dyspnea on exertion and he has left basilar crackles appreciated on today's exam.  At that time, my plan was: Increase Bumex to 4 mg twice daily.  He is not on any potassium replacement despite this being on his medication list.  His wife states that he is not taking any potassium.  Therefore I will check his BMP and adjust his potassium replacement if necessary.  Reassess on Thursday.  While the patient was here, I checked his INR.  INR is therapeutic at 2.6 with a PTT of 31.7.  We will make no changes in his Coumadin dose.  Reassess via telephone on Thursday.  10/15/18 Unfortunately, leg swelling does  not seem any better.  In fact it appears to be worse.  Pitting edema in the right leg is +2.  Now pitting edema in the left leg is approaching +2 all the way up to just below the knee.  The patient's skin hurts simply from the stretch.  He reports aching and throbbing in his legs.  Yesterday was particularly bad.  Today is a little bit better.  Unfortunately he is unable to wear any compression hose to prevent third spacing.  He is no longer eating very well.  He is only drinking water.  I believe the protein calorie malnutrition is likely decreasing the intravascular  oncotic pressure and leading to worsening third spacing.  On his most recent BMP, his renal function was deteriorating showing depleted intravascular volume.  Therefore I am unable to increase his diuretic dose to improve the third spacing.  At that time, my plan was: Patient has significant leg swelling due to chronic venous insufficiency as well as chronic diastolic congestive heart failure.  Now due to protein calorie malnutrition, he is losing intravascular oncotic pressure contributing to the third spacing and worsening the edema.  He is unable to wear compression hose at home which is only exacerbating the problem and oral diuretics are approaching prerenal azotemia.  Therefore I placed the patient in bilateral Unna boots today and will recheck the patient on Monday.  If this is helping, I will start having home health nursing apply the Unna boots once a week for 3 or 4 days and then remove the Unna boots in an effort to try to maintain and control his leg swelling.  Patient is unable to wear compression hose but we need to do some type of compressive therapy to help manage and control his leg swelling.  He has current diuretic dose.  Encouraged protein supplement such as Ensure 2 to 3 cans a day due to his poor appetite.  10/19/18 Swelling has improved dramatically in both legs.  He has no edema distal to his knee and his left leg.  There is  some +1 pitting edema in his foot past the ankle down around the toes and the dorsal forefoot.  He has trace to +1 edema in his right leg distal to the knee.  He has +1 edema in the forefoot past the ankle.  Legs feel much better.  He is lost 2 pounds.  Unna boots were removed today.  However the patient is unable to change his compression hose.  His wife is not strong enough to put his compression hose on.  The patient is not strong enough to put his compression hose on.  Without compression therapy, the swelling will quickly return.  I explained this to the patient in detail.  I believe he would benefit from having home health nurse come out once a week and placing him in an Unna boot and then removing it 3 days later and then going 3 days without the Unna boot to try to moderate and control the swelling.  Patient is willing to try this.  At that time, my plan was: Leg swelling has improved dramatically status post Unna boots.  These were removed today.  However as I explained to the patient, the swelling will rapidly return without some type of compressive therapy.  He is not physically able to put on compression hose.  His wife is not physically able to put on compression hose.  They simply remove the socks usually after 1 day and then never put them on again.  Diuretics alone are unable to control swelling.  Therefore I have recommended home health come to his house and apply the Unna boots once a week for approximately 3 days and then remove the Unna boots.  Perhaps we do this weekly we can control swelling.  Patient could also come here to the office however is a tremendous hardship for his wife as the patient is barely able to stand and she is not strong enough to get him in and out of the wheelchair at home.  Therefore I will see if home health can come do this to help manage the leg  swelling.  The pain in his legs have improved dramatically now that we have improved the edema  10/28/18 Home health  nurses have not been able to come out to his home due to the coronavirus pandemic.  Patient is unable to wear compression hose.  As result, as anticipated, the swelling has quickly returned to both of his legs.  He has +2 pitting edema in his right leg up to his knee.  At times his leg is weeping from shallow small lesions on the anterior surface of his shin.  He has +1 pitting edema in his left leg up to his knee.  Both legs are aching due to the swelling and the pain.  At that time, my plan was: Patient was placed in Unna boots bilaterally as before to control the swelling in his legs.  We will change the dressings on Monday.  Until the home health nurses can come out to his house and start doing these once a week, we will have to perform the service here in order to control the swelling in his legs is neither the patient or his wife are able to apply compression hose and without compression therapy, his legs quickly swell.  11/02/18 Patient was originally scheduled today to have the Unna boot replaced on his right leg.  However over the weekend he developed severe pain in his right leg.  History is difficult to obtain due to the patient's manner of speech.  However as best I can ascertain, the patient has had severe pain beginning in the posterior aspect of his right hip radiating down his leg into his right lower leg.  The compression wraps made the pain worse per tickly distal to the knee.  There was so much swelling in his right leg that his wife had to remove the compression wrap from his right leg on Sunday as his leg was swelling above the compression wrap causing significant pain.  There is +2 edema in the right leg today from his ankle all the way up to above his knee.  However he is also having sharp severe pain in his right posterior hip.  He denies any falls or injuries.  He is unable to characterize the pain is burning or radiating or shooting.  He describes it more as a stabbing pain.  It hurts  for me to palpation his posterior iliac crest.  He also has tenderness to palpation over the greater trochanter in the right hip.  He has a positive Homans sign today he also has pain with compression of his upper thigh.  Pain is significant enough that it hurts to bear weight all the way down his leg.  At that time, my plan was: Lungs are relatively clear today.  I do not believe this is fluid overload from congestive heart failure.  Given the severity of the pain in his right leg I am concerned about a hip fracture, lumbar radiculopathy/sciatica, or DVT particular given the significant swelling in his right leg.  I will send the patient urgently for a venous ultrasound to rule out a DVT.  I will also obtain an x-ray of his right hip and pelvis to rule out a hip fracture or other skeletal pathology.  If the x-ray is negative for any hip fracture or bony pathology and the ultrasound is negative for a DVT, I will place the patient on a prednisone taper pack for presumed lumbar radiculopathy and resume compression wraps and diuresis.  Ultrasound confirmed new near  total occlusion DVT in the right popliteal vein.  Plan was made to discontinue Coumadin and replace with Eliquis 10 mg twice a day for the first week followed by 5 mg twice daily thereafter.  I will reassess the patient on Thursday.  Patient was given samples of Eliquis to begin taking immediately.  Also recommended that he elevate his leg as much as possible to allow the swelling to decrease.  Reassess the patient on Thursday.  11/05/18  Patient presents today for follow-up.  He states that the pain in his right leg distal to his right knee has improved after only being on Eliquis for 2 days.  He continues to have extensive +2 edema from his knee to his ankle.  The swelling in his leg has not changed perceptibly over the last 2 days however the patient has seen significant improvement in the pain that was located around his knee and in his upper calf  suggesting that the Eliquis is successfully treating the DVT.  We had discussed using Lovenox instead of Eliquis after he had the results 2 days ago.  Patient had wanted to try Eliquis first.  Given the fact that the pain is improving, together we decided to continue Eliquis indefinitely however we will need to monitor for progression.  If the swelling seems to worsen in his right leg and the pain worsens, I would quickly switch the patient to Lovenox.  I explained the unusual situation that the patient is in and that he formed a DVT while taking Coumadin at apparently a therapeutic dose.  Patient is comfortable continuing Eliquis at the present time.  However I do not believe the pain in his posterior right hip is related to the DVT.  X-ray showed severe degenerative disc disease in his lower back.  The patient continues to have a shooting-like pain in his posterior right hip suggesting lumbar radiculopathy as a likely cause.  There was no significant arthritis or fracture seen on the x-ray of his right hip.  At that time, my plan was: I believe the pain and the swelling in the patient's right leg from his knee to his foot was due to the acute DVT in his right leg.  We will continue Eliquis and ultimately start the patient on 5 mg twice daily indefinitely.  If the swelling worsens or the pain worsens, I would switch the patient to Lovenox 80 mg subcu twice daily indefinitely.  I explained in detail the unique situation to the patient and his wife and they are comfortable with this plan especially given how much the pain has improved over the last 2 days since starting Eliquis.  I believe the pain in his posterior right hip is likely due to lumbar radiculopathy given the x-ray findings.  Therefore we will try the patient on a prednisone taper pack as he is unable to take NSAIDs due to his anticoagulation.  He can use tramadol for breakthrough pain.  I suspect that the swelling in his leg will gradually improve over  the next 1 to 2 months as the DVT begins to recanalize.  Recheck in 3 months or as needed  11/17/18 Wife called yesterday concerned that the patient may have pneumonia.  According to her report he was coughing terribly.  Given his multiple medical comorbidities, I felt it was necessary to evaluate the patient visibly to determine the etiology of the cough.  Patient denies any chest pain.  He denies any new shortness of breath.  He denies  any pleurisy.  He states that he gets a cough usually at night which is phlegm and congestion that hangs in his upper throat and upper airways.  He coughs hard to try to bring it up and sometimes he gags on the phlegm.  He denies any wheezing.  He denies any pleurisy.  He denies any hemoptysis.  He denies any fevers or chills.  He states that the cough has gone on ever since he had gallbladder surgery in December 2017.  He states that the cough is not changing.  Past Medical History:  Diagnosis Date   Atelectasis of right lung    Chronic diastolic (congestive) heart failure ([place])    a. 10/2013 Echo: EF 55-60%, basal-mid anteroseptal and basal-mid inferoseptal HK; b. 02/2017 Echo: EF 60-65%, Gr3 DD.   Chronic fatigue    Chronic subdural hematoma ([place])    a. 12/2015 Head CT: chronic right holo-hemispheric SDH w/o midline shift.   CKD (chronic kidney disease), stage III ([place])    Coronary artery disease    a. 11/2014 NSTEMI: DESx 2 placed to LAD and RCA; b. 08/2015 Cath: LAD patent stent, LCX 74m, RCA patent stent, 21m.   Diabetes mellitus, type II ([place])    a. HgA1c 6.8 in 03/2015   ED (erectile dysfunction)    Gout    Hemangioma of liver 08/02/2015   12 mm enhancing lesion seen on CT - suspicious for benign hemangioma but not diagnostic - MRI recommended   HLD (hyperlipidemia)    Hypertension    Hypertensive heart disease    Ischemic finger ulcer ([place])    a. 03/2017 Seen for digital ulcers - felt to be embolic in setting of PAF. Pt on coumadin.    Kidney stones    Lower extremity edema    a. chronic   OA (osteoarthritis)    Orthostatic hypotension    PAF (paroxysmal atrial fibrillation) ([place])    a. 10/2015 s/p DCCV;  b. CHA2DS2VASc = 6--> on coumadin; c. 10/2015 Echo: sev dil LA.   Prostate cancer ([place])    Pulmonary HTN ([place])    Pulmonary hypertension ([place])    a. 10/2015 Echo: PASP 75mmHg.   Renal insufficiency    Severe mitral regurgitation    a.  10/2015 s/p minimally invasive MV repair; b. 10/2015 Echo: mod MS, mean grad 74mmHg; c. 02/2017 Echo: EF 60-65%, Gr3 DD, mod Ca2+ MV annulus, mod to sev dil LA, mod dil RA, mod TR. PASP 21mmHg.   Spinal stenosis    SVT (supraventricular tachycardia) ([facility])    a. recurrent, usually responsive to vagal manuevers   Tricuspid regurgitation    a. 10/2015 Echo: mild to mod TR.   Past Surgical History:  Procedure Laterality Date   BACK SURGERY     Back surgery x 2     CARDIAC CATHETERIZATION N/A 08/08/2015   Procedure: Right/Left Heart Cath and Coronary Angiography;  Surgeon: Sherren Mocha, MD;  Location: [facility];  Service: Cardiovascular;  Laterality: N/A;   CARDIAC SURGERY     2 cardiac stents   CARDIOVERSION N/A 10/20/2015   Procedure: CARDIOVERSION;  Surgeon: Pixie Casino, MD;  Location: [facility];  Service: Cardiovascular;  Laterality: N/A;   CERVICAL SPINE SURGERY     CHOLECYSTECTOMY N/A 07/08/2016   Procedure: LAPAROSCOPIC CHOLECYSTECTOMY WITH INTRAOPERATIVE CHOLANGIOGRAM;  Surgeon: Georganna Skeans, MD;  Location: [facility];  Service: General;  Laterality: N/A;   CORONARY ARTERY BYPASS GRAFT     ESOPHAGOGASTRODUODENOSCOPY (EGD) WITH PROPOFOL N/A  03/19/2018   Procedure: ESOPHAGOGASTRODUODENOSCOPY (EGD) WITH PROPOFOL;  Surgeon: [name], MD;  [facility];  Service: Gastroenterology;  Laterality: N/A;   EYE SURGERY     INTRAOPERATIVE TRANSESOPHAGEAL ECHOCARDIOGRAM  10/05/2015   Procedure: INTRAOPERATIVE TRANSESOPHAGEAL ECHOCARDIOGRAM;  Surgeon:  Rexene Alberts, MD;  Location: [facility];  Service: Open Heart Surgery;;   JOINT REPLACEMENT     shoulder   LEFT HEART CATHETERIZATION WITH CORONARY ANGIOGRAM N/A 11/16/2014   Procedure: LEFT HEART CATHETERIZATION WITH CORONARY ANGIOGRAM;  Surgeon: Troy Sine, MD;  Location: [facility];  Service: Cardiovascular;  Laterality: N/A;   MITRAL VALVE REPAIR Right 10/04/2015   Procedure: MINIMALLY INVASIVE MITRAL VALVE REPAIR (MVR);  Surgeon: Rexene Alberts, MD;  Location: [facility];  Service: Open Heart Surgery;  Laterality: Right;   Rt rotator cuff repair     TEE WITHOUT CARDIOVERSION N/A 06/22/2015   Procedure: TRANSESOPHAGEAL ECHOCARDIOGRAM (TEE);  Surgeon: Skeet Latch, MD;  Location: [facility];  Service: Cardiovascular;  Laterality: N/A;   TEE WITHOUT CARDIOVERSION N/A 10/04/2015   Procedure: TRANSESOPHAGEAL ECHOCARDIOGRAM (TEE);  Surgeon: Rexene Alberts, MD;  Location: [facility];  Service: Open Heart Surgery;  Laterality: N/A;   WOUND EXPLORATION N/A 10/05/2015   Procedure: Sternal Incision;  Surgeon: Rexene Alberts, MD;  Location: [facility];  Service: Open Heart Surgery;  Laterality: N/A;   Current Outpatient Medications on File Prior to Visit  Medication Sig Dispense Refill   allopurinol (ZYLOPRIM) 100 MG tablet TAKE ONE (1) TABLET EACH DAY 90 tablet 2   apixaban (ELIQUIS) 5 MG TABS tablet Take 1 tablet (5 mg total) by mouth 2 (two) times daily. 60 tablet 5   bumetanide (BUMEX) 2 MG tablet Take 2 tablets (4 mg total) by mouth 2 (two) times daily. 180 tablet 0   cyclobenzaprine (FLEXERIL) 5 MG tablet Take 1 tablet (5 mg total) by mouth 3 (three) times daily as needed for muscle spasms. 30 tablet 0   docusate sodium (COLACE) 100 MG capsule Take 1 capsule (100 mg total) by mouth daily as needed for moderate constipation. 60 capsule 2   finasteride (PROSCAR) 5 MG tablet Take 1 tablet (5 mg total) by mouth at bedtime. 90 tablet 2   loratadine (CLARITIN) 10 MG tablet Take 1 tablet (10 mg  total) by mouth daily as needed for allergies. 30 tablet 3   metoprolol tartrate (LOPRESSOR) 25 MG tablet Take 0.5 tablets (12.5 mg total) by mouth 2 (two) times daily. 90 tablet 3   OVER THE COUNTER MEDICATION Neuroquell: Take 1 capsule by mouth once a day for immune health     pravastatin (PRAVACHOL) 40 MG tablet TAKE 1 TABLET BY MOUTH EVERY DAY AT 6PM 30 tablet 9   predniSONE (DELTASONE) 20 MG tablet 3 tabs poqday 1-2, 2 tabs poqday 3-4, 1 tab poqday 5-6 12 tablet 0   spironolactone (ALDACTONE) 25 MG tablet Take 1 tablet (25 mg total) by mouth daily. 90 tablet 3   traMADol (ULTRAM) 50 MG tablet TAKE ONE TABLET BY MOUTH EVERY 6 HOURS AS NEEDED FOR MODERATE PAIN 30 tablet 0   triamcinolone cream (KENALOG) 0.1 % Apply 1 application topically 2 (two) times daily. 30 g 0   No current facility-administered medications on file prior to visit.    No Known Allergies Social History   Socioeconomic History   Marital status: Married    Spouse name: Not on file   Number of children: 4   Years of education: Not on file  Highest education level: Not on file  Occupational History    Employer: RETIRED  Social Designer, fashion/clothing strain: Not on file   Food insecurity:    Worry: Not on file    Inability: Not on file   Transportation needs:    Medical: Not on file    Non-medical: Not on file  Tobacco Use   Smoking status: Former Smoker    Types: Cigarettes, Pipe, Cigars   Smokeless tobacco: Former Systems developer    Types: Chew   Tobacco comment: [place] YEARS AGO"  Substance and Sexual Activity   Alcohol use: No   Drug use: No   Sexual activity: Not on file  Lifestyle   Physical activity:    Days per week: Not on file    Minutes per session: Not on file   Stress: Not on file  Relationships   Social connections:    Talks on phone: Not on file    Gets together: Not on file    Attends religious service: Not on file    Active member of club or  organization: Not on file    Attends meetings of clubs or organizations: Not on file    Relationship status: Not on file   Intimate partner violence:    Fear of current or ex partner: Not on file    Emotionally abused: Not on file    Physically abused: Not on file    Forced sexual activity: Not on file  Other Topics Concern   Not on file  Social History Narrative   ** Merged History Encounter **       Four adopted children.  Lives alone.       Review of Systems  All other systems reviewed and are negative.      Objective:   Physical Exam  Constitutional: He appears well-developed and well-nourished. No distress.  Cardiovascular: Normal rate. An irregularly irregular rhythm present.  Murmur heard. Pulmonary/Chest: Effort normal. No respiratory distress. He has no wheezes. He has no rhonchi. He has no rales.  Abdominal: Soft. Bowel sounds are normal. He exhibits no distension. There is no abdominal tenderness. There is no rebound.  Musculoskeletal:        General: Edema present.     Right upper leg: He exhibits tenderness. He exhibits no swelling and no edema.     Right lower leg: Edema present.     Left lower leg: Edema present.  Skin: He is not diaphoretic.  Vitals reviewed.      Assessment & Plan:  Leg swelling  Cough  Patient also complains about leg swelling which is likely due to his chronic venous insufficiency exacerbated by his acute DVT.  He cannot tolerate compression hose.  He does not tolerate Unna boots.  Therefore I have given the patient an Ace bandage and have asked his wife to wrap his legs similar to an Haematologist every day.  He can vary the pressure for his own level of comfort.  I believe any type of compression will only help with the swelling.  Continue to use Bumex 4 mg twice daily as the patient appears euvolemic.  I believe the patient also has a chronic cough.  At this point I see no evidence of pneumonia.  I recommended using Mucinex twice daily  as a mucolytic and then using Tessalon Perles 200 mg every 8 hours as needed for cough.  Patient has had imaging of the chest several times since his  gallbladder surgery in 2017 with no obvious neoplastic process causing the cough.  If the cough worsens, consider adding a PPI for laryngo-esophageal reflux and repeating a chest x-ray.  Last chest x-ray was in July 2019 and at that time it only showed chronic scarring in the right base but no neoplastic process.

## 2018-11-19 ENCOUNTER — Ambulatory Visit (INDEPENDENT_AMBULATORY_CARE_PROVIDER_SITE_OTHER): Payer: Medicare Other | Admitting: Family Medicine

## 2018-11-19 ENCOUNTER — Other Ambulatory Visit: Payer: Self-pay

## 2018-11-19 VITALS — BP 90/54 | HR 102 | Temp 97.5°F | Resp 18 | Ht 67.0 in

## 2018-11-19 DIAGNOSIS — Z7901 Long term (current) use of anticoagulants: Secondary | ICD-10-CM | POA: Diagnosis not present

## 2018-11-19 DIAGNOSIS — R319 Hematuria, unspecified: Secondary | ICD-10-CM

## 2018-11-19 DIAGNOSIS — R05 Cough: Secondary | ICD-10-CM

## 2018-11-19 DIAGNOSIS — R059 Cough, unspecified: Secondary | ICD-10-CM

## 2018-11-19 MED ORDER — LEVOFLOXACIN 500 MG PO TABS: 500.0000 mg | ORAL_TABLET | Freq: Every day | ORAL | 0 refills | Status: DC

## 2018-11-19 NOTE — Progress Notes (Signed)
Subjective:    Patient ID: [name], male    DOB: Nov 21, 1928, 85 y.o.   MRN: [id number]  HPI   09/10/18 I reviewed the patient's recent visit with his cardiologist.  Since that time, his legs have started to swell dramatically.  He has +2 pitting edema in his right leg up to his knee.  He is unable to wear his compression hose due to the swelling.  He also has +2 pitting edema in his left leg up to his knee.  His legs are so swollen that they hurt.  He also reports dyspnea with exertion which is mild.  He denies any chest pain.  He denies any syncope.  He denies any lightheadedness.  His blood pressure today however is extremely soft at 88/60.  I confirmed this personally.  However he has bibasilar crackles in both lungs dusting fluid overload despite his weight essentially remaining stable.  At that time, my plan was: Patient's exam today is markedly different than his exam in early January.  He now has pitting edema and rales on pulmonary exam.  He appears fluid overloaded today.  Furthermore his blood pressure is low.  We discussed going to the hospital for diuresis.  The patient feels fine other than pain in his legs due to the swelling and mild shortness of breath with activity.  He wants to avoid the hospital due to the exposure to flu and other illnesses which I agree would likely kill him if he contracted a secondary infection.  Therefore we will discontinue metoprolol to avoid hypotension after diuresis and increase his Lasix to 3 pills twice daily.  I will see the patient back first thing tomorrow.  Consider using compression Unna boots tomorrow if necessary to help control the swelling.  Explained to the patient and his wife that I am concerned he may be showing worsening signs of congestive heart failure and that this could be a sign that his condition is becoming more serious and potentially life-threatening.  Patient understands and elects to go home.  He will follow-up with me tomorrow  and we will attempt outpatient diuresis.  Consider hospice referral if the patient continues to deteriorate  09/11/18 Patient's blood pressure is now improved discontinue metoprolol.  However despite increasing Lasix there has not been substantial improvement in the swelling in his legs.  I believe the patient is more third spacing then fluid overloaded.  Because his legs are swollen so bad he is unable to wear his compression hose and therefore the third spacing and pitting edema in his legs continues to worsen.  He denies any dizziness or syncope or chest pain or shortness of breath.  At that time, my plan was: Continue Lasix 3 tablets twice daily however I placed the patient in bilateral Unna boots today in an effort to help with the third spacing to drive the pitting edema from the interstitial tissues back into the blood vessels where the diuretics can be more effective.  We will see the patient back on Monday to remove the Unna boots and at that time we will switch him back to his compression hose if the swelling has improved.  Resume previous dose of Lasix, 2 pills twice a day on Sunday to avoid dehydration.  If swelling is better Monday, will resume the metoprolol at that time after we have decreased his Lasix  09/14/18 Patient is here today to follow-up.  I removed his Unna boots.  The swelling had improved dramatically in  both feet and is down to normal now.  Patient brings in a brand-new pair of compression hose for me to apply to his legs today.  I was able to put the patient back into his compression support hose today after I remove the Unna boots.  His blood pressure is also better and now appears stable to resume metoprolol especially given that he is no longer taking the elevated dose of Lasix.  At that time, my plan was: I remove the patient's Unna boots.  He was placed in compression hose bilaterally.  Continue to wear compression hose on a daily basis to control swelling.  Resume previous  dose of Lasix 80 mg twice daily.Marland Kitchen  Resume metoprolol 12.5 mg p.o. twice daily  10/08/18 Wt Readings from Last 3 Encounters:  11/17/18 210 lb (95.3 kg)  11/05/18 210 lb (95.3 kg)  11/02/18 216 lb (98 kg)   Patient presents today with swelling in both legs right greater than left.  He has +2 pitting edema in his right leg and +1 pitting edema in his left leg up to his knee.  Patient denies any chest pain.  He has chronic shortness of breath is no different from his baseline.  Weight is increased 4 pounds since 1 month ago.  He is taking lasix 120 mg in the morning and 80 mg in the evening and has not achieved any successful diuresis.  He has stopped wearing compression hose due to the pain in his legs.  Stopped the compression hose the day after I last saw him.  AT that time, my plan was: Discontinue Lasix and switch to Bumex 2 mg p.o. twice daily.  Recheck via telephone tomorrow.  Will increase to 4 mg p.o. twice daily if the patient has not experienced any diuresis and will recheck the patient on Monday or sooner if worse.  Continue current potassium dose  10/12/18 Patient has seen no benefit since switching from Lasix to Bumex 2 mg twice daily.  He continues to have significant edema in his right leg greater than his left leg.  It is still tender to palpation in both his right and left leg.  He also has dyspnea on exertion and he has left basilar crackles appreciated on today's exam.  At that time, my plan was: Increase Bumex to 4 mg twice daily.  He is not on any potassium replacement despite this being on his medication list.  His wife states that he is not taking any potassium.  Therefore I will check his BMP and adjust his potassium replacement if necessary.  Reassess on Thursday.  While the patient was here, I checked his INR.  INR is therapeutic at 2.6 with a PTT of 31.7.  We will make no changes in his Coumadin dose.  Reassess via telephone on Thursday.  10/15/18 Unfortunately, leg swelling does  not seem any better.  In fact it appears to be worse.  Pitting edema in the right leg is +2.  Now pitting edema in the left leg is approaching +2 all the way up to just below the knee.  The patient's skin hurts simply from the stretch.  He reports aching and throbbing in his legs.  Yesterday was particularly bad.  Today is a little bit better.  Unfortunately he is unable to wear any compression hose to prevent third spacing.  He is no longer eating very well.  He is only drinking water.  I believe the protein calorie malnutrition is likely decreasing the intravascular  oncotic pressure and leading to worsening third spacing.  On his most recent BMP, his renal function was deteriorating showing depleted intravascular volume.  Therefore I am unable to increase his diuretic dose to improve the third spacing.  At that time, my plan was: Patient has significant leg swelling due to chronic venous insufficiency as well as chronic diastolic congestive heart failure.  Now due to protein calorie malnutrition, he is losing intravascular oncotic pressure contributing to the third spacing and worsening the edema.  He is unable to wear compression hose at home which is only exacerbating the problem and oral diuretics are approaching prerenal azotemia.  Therefore I placed the patient in bilateral Unna boots today and will recheck the patient on Monday.  If this is helping, I will start having home health nursing apply the Unna boots once a week for 3 or 4 days and then remove the Unna boots in an effort to try to maintain and control his leg swelling.  Patient is unable to wear compression hose but we need to do some type of compressive therapy to help manage and control his leg swelling.  He has current diuretic dose.  Encouraged protein supplement such as Ensure 2 to 3 cans a day due to his poor appetite.  10/19/18 Swelling has improved dramatically in both legs.  He has no edema distal to his knee and his left leg.  There is  some +1 pitting edema in his foot past the ankle down around the toes and the dorsal forefoot.  He has trace to +1 edema in his right leg distal to the knee.  He has +1 edema in the forefoot past the ankle.  Legs feel much better.  He is lost 2 pounds.  Unna boots were removed today.  However the patient is unable to change his compression hose.  His wife is not strong enough to put his compression hose on.  The patient is not strong enough to put his compression hose on.  Without compression therapy, the swelling will quickly return.  I explained this to the patient in detail.  I believe he would benefit from having home health nurse come out once a week and placing him in an Unna boot and then removing it 3 days later and then going 3 days without the Unna boot to try to moderate and control the swelling.  Patient is willing to try this.  At that time, my plan was: Leg swelling has improved dramatically status post Unna boots.  These were removed today.  However as I explained to the patient, the swelling will rapidly return without some type of compressive therapy.  He is not physically able to put on compression hose.  His wife is not physically able to put on compression hose.  They simply remove the socks usually after 1 day and then never put them on again.  Diuretics alone are unable to control swelling.  Therefore I have recommended home health come to his house and apply the Unna boots once a week for approximately 3 days and then remove the Unna boots.  Perhaps we do this weekly we can control swelling.  Patient could also come here to the office however is a tremendous hardship for his wife as the patient is barely able to stand and she is not strong enough to get him in and out of the wheelchair at home.  Therefore I will see if home health can come do this to help manage the leg  swelling.  The pain in his legs have improved dramatically now that we have improved the edema  10/28/18 Home health  nurses have not been able to come out to his home due to the coronavirus pandemic.  Patient is unable to wear compression hose.  As result, as anticipated, the swelling has quickly returned to both of his legs.  He has +2 pitting edema in his right leg up to his knee.  At times his leg is weeping from shallow small lesions on the anterior surface of his shin.  He has +1 pitting edema in his left leg up to his knee.  Both legs are aching due to the swelling and the pain.  At that time, my plan was: Patient was placed in Unna boots bilaterally as before to control the swelling in his legs.  We will change the dressings on Monday.  Until the home health nurses can come out to his house and start doing these once a week, we will have to perform the service here in order to control the swelling in his legs is neither the patient or his wife are able to apply compression hose and without compression therapy, his legs quickly swell.  11/02/18 Patient was originally scheduled today to have the Unna boot replaced on his right leg.  However over the weekend he developed severe pain in his right leg.  History is difficult to obtain due to the patient's manner of speech.  However as best I can ascertain, the patient has had severe pain beginning in the posterior aspect of his right hip radiating down his leg into his right lower leg.  The compression wraps made the pain worse per tickly distal to the knee.  There was so much swelling in his right leg that his wife had to remove the compression wrap from his right leg on Sunday as his leg was swelling above the compression wrap causing significant pain.  There is +2 edema in the right leg today from his ankle all the way up to above his knee.  However he is also having sharp severe pain in his right posterior hip.  He denies any falls or injuries.  He is unable to characterize the pain is burning or radiating or shooting.  He describes it more as a stabbing pain.  It hurts  for me to palpation his posterior iliac crest.  He also has tenderness to palpation over the greater trochanter in the right hip.  He has a positive Homans sign today he also has pain with compression of his upper thigh.  Pain is significant enough that it hurts to bear weight all the way down his leg.  At that time, my plan was: Lungs are relatively clear today.  I do not believe this is fluid overload from congestive heart failure.  Given the severity of the pain in his right leg I am concerned about a hip fracture, lumbar radiculopathy/sciatica, or DVT particular given the significant swelling in his right leg.  I will send the patient urgently for a venous ultrasound to rule out a DVT.  I will also obtain an x-ray of his right hip and pelvis to rule out a hip fracture or other skeletal pathology.  If the x-ray is negative for any hip fracture or bony pathology and the ultrasound is negative for a DVT, I will place the patient on a prednisone taper pack for presumed lumbar radiculopathy and resume compression wraps and diuresis.  Ultrasound confirmed new near  total occlusion DVT in the right popliteal vein.  Plan was made to discontinue Coumadin and replace with Eliquis 10 mg twice a day for the first week followed by 5 mg twice daily thereafter.  I will reassess the patient on Thursday.  Patient was given samples of Eliquis to begin taking immediately.  Also recommended that he elevate his leg as much as possible to allow the swelling to decrease.  Reassess the patient on Thursday.  11/05/18  Patient presents today for follow-up.  He states that the pain in his right leg distal to his right knee has improved after only being on Eliquis for 2 days.  He continues to have extensive +2 edema from his knee to his ankle.  The swelling in his leg has not changed perceptibly over the last 2 days however the patient has seen significant improvement in the pain that was located around his knee and in his upper calf  suggesting that the Eliquis is successfully treating the DVT.  We had discussed using Lovenox instead of Eliquis after he had the results 2 days ago.  Patient had wanted to try Eliquis first.  Given the fact that the pain is improving, together we decided to continue Eliquis indefinitely however we will need to monitor for progression.  If the swelling seems to worsen in his right leg and the pain worsens, I would quickly switch the patient to Lovenox.  I explained the unusual situation that the patient is in and that he formed a DVT while taking Coumadin at apparently a therapeutic dose.  Patient is comfortable continuing Eliquis at the present time.  However I do not believe the pain in his posterior right hip is related to the DVT.  X-ray showed severe degenerative disc disease in his lower back.  The patient continues to have a shooting-like pain in his posterior right hip suggesting lumbar radiculopathy as a likely cause.  There was no significant arthritis or fracture seen on the x-ray of his right hip.  At that time, my plan was: I believe the pain and the swelling in the patient's right leg from his knee to his foot was due to the acute DVT in his right leg.  We will continue Eliquis and ultimately start the patient on 5 mg twice daily indefinitely.  If the swelling worsens or the pain worsens, I would switch the patient to Lovenox 80 mg subcu twice daily indefinitely.  I explained in detail the unique situation to the patient and his wife and they are comfortable with this plan especially given how much the pain has improved over the last 2 days since starting Eliquis.  I believe the pain in his posterior right hip is likely due to lumbar radiculopathy given the x-ray findings.  Therefore we will try the patient on a prednisone taper pack as he is unable to take NSAIDs due to his anticoagulation.  He can use tramadol for breakthrough pain.  I suspect that the swelling in his leg will gradually improve over  the next 1 to 2 months as the DVT begins to recanalize.  Recheck in 3 months or as needed  11/17/18 Wife called yesterday concerned that the patient may have pneumonia.  According to her report he was coughing terribly.  Given his multiple medical comorbidities, I felt it was necessary to evaluate the patient visibly to determine the etiology of the cough.  Patient denies any chest pain.  He denies any new shortness of breath.  He denies  any pleurisy.  He states that he gets a cough usually at night which is phlegm and congestion that hangs in his upper throat and upper airways.  He coughs hard to try to bring it up and sometimes he gags on the phlegm.  He denies any wheezing.  He denies any pleurisy.  He denies any hemoptysis.  He denies any fevers or chills.  He states that the cough has gone on ever since he had gallbladder surgery in December 2017.  He states that the cough is not changing.  At that time, my plan was: Patient also complains about leg swelling which is likely due to his chronic venous insufficiency exacerbated by his acute DVT.  He cannot tolerate compression hose.  He does not tolerate Unna boots.  Therefore I have given the patient an Ace bandage and have asked his wife to wrap his legs similar to an Haematologist every day.  He can vary the pressure for his own level of comfort.  I believe any type of compression will only help with the swelling.  Continue to use Bumex 4 mg twice daily as the patient appears euvolemic.  I believe the patient also has a chronic cough.  At this point I see no evidence of pneumonia.  I recommended using Mucinex twice daily as a mucolytic and then using Tessalon Perles 200 mg every 8 hours as needed for cough.  Patient has had imaging of the chest several times since his gallbladder surgery in 2017 with no obvious neoplastic process causing the cough.  If the cough worsens, consider adding a PPI for laryngo-esophageal reflux and repeating a chest x-ray.  Last chest  x-ray was in July 2019 and at that time it only showed chronic scarring in the right base but no neoplastic process.  11/19/18 Patient's cough is worsened.  This morning he had an episode of trace hemoptysis with the cough.  He still has chest congestion despite taking Mucinex.  He denies any fevers.  Oxygen saturation is 100% on room air however he now has left-sided pronounced crackles in the base that was not present earlier this week concerning for developing pneumonia.  Also this morning he developed gross hematuria.  On examination today, the patient is an uncircumcised male.  There is dry blood coming from the urethral meatus.  There is also approximately 1 to 2 mL of dried blood inside his underwear.  The patient's wife states that he has had to change his pad 1 time today due to bleeding and that he was peeing blood earlier this morning.  Therefore he has gross hematuria.  He denies any dysuria or fever.  He denies any pelvic pain.  Hematuria is painless.  Patient is supposed to be on Eliquis 5 mg twice a day for his acute right leg DVT.  However the patient's wife never decreased his dose.  She is still giving him 10 mg twice daily.  Past Medical History:  Diagnosis Date   Atelectasis of right lung    Chronic diastolic (congestive) heart failure ([place])    a. 10/2013 Echo: EF 55-60%, basal-mid anteroseptal and basal-mid inferoseptal HK; b. 02/2017 Echo: EF 60-65%, Gr3 DD.   Chronic fatigue    Chronic subdural hematoma ([place])    a. 12/2015 Head CT: chronic right holo-hemispheric SDH w/o midline shift.   CKD (chronic kidney disease), stage III ([place])    Coronary artery disease    a. 11/2014 NSTEMI: DESx 2 placed to LAD and RCA; b.  08/2015 Cath: LAD patent stent, LCX 46m, RCA patent stent, 76m.   Diabetes mellitus, type II ([place])    a. HgA1c 6.8 in 03/2015   ED (erectile dysfunction)    Gout    Hemangioma of liver 08/02/2015   12 mm enhancing lesion seen on CT - suspicious for benign  hemangioma but not diagnostic - MRI recommended   HLD (hyperlipidemia)    Hypertension    Hypertensive heart disease    Ischemic finger ulcer ([place])    a. 03/2017 Seen for digital ulcers - felt to be embolic in setting of PAF. Pt on coumadin.   Kidney stones    Lower extremity edema    a. chronic   OA (osteoarthritis)    Orthostatic hypotension    PAF (paroxysmal atrial fibrillation) ([place])    a. 10/2015 s/p DCCV;  b. CHA2DS2VASc = 6--> on coumadin; c. 10/2015 Echo: sev dil LA.   Prostate cancer ([place])    Pulmonary HTN ([place])    Pulmonary hypertension ([place])    a. 10/2015 Echo: PASP 35mmHg.   Renal insufficiency    Severe mitral regurgitation    a.  10/2015 s/p minimally invasive MV repair; b. 10/2015 Echo: mod MS, mean grad 64mmHg; c. 02/2017 Echo: EF 60-65%, Gr3 DD, mod Ca2+ MV annulus, mod to sev dil LA, mod dil RA, mod TR. PASP 78mmHg.   Spinal stenosis    SVT (supraventricular tachycardia) ([facility])    a. recurrent, usually responsive to vagal manuevers   Tricuspid regurgitation    a. 10/2015 Echo: mild to mod TR.   Past Surgical History:  Procedure Laterality Date   BACK SURGERY     Back surgery x 2     CARDIAC CATHETERIZATION N/A 08/08/2015   Procedure: Right/Left Heart Cath and Coronary Angiography;  Surgeon: Sherren Mocha, MD;  Location: [facility];  Service: Cardiovascular;  Laterality: N/A;   CARDIAC SURGERY     2 cardiac stents   CARDIOVERSION N/A 10/20/2015   Procedure: CARDIOVERSION;  Surgeon: Pixie Casino, MD;  Location: [facility];  Service: Cardiovascular;  Laterality: N/A;   CERVICAL SPINE SURGERY     CHOLECYSTECTOMY N/A 07/08/2016   Procedure: LAPAROSCOPIC CHOLECYSTECTOMY WITH INTRAOPERATIVE CHOLANGIOGRAM;  Surgeon: Georganna Skeans, MD;  Location: [facility];  Service: General;  Laterality: N/A;   CORONARY ARTERY BYPASS GRAFT     ESOPHAGOGASTRODUODENOSCOPY (EGD) WITH PROPOFOL N/A 03/19/2018   Procedure: ESOPHAGOGASTRODUODENOSCOPY (EGD) WITH  PROPOFOL;  Surgeon: [name], MD;  Location: [facility];  Service: Gastroenterology;  Laterality: N/A;   EYE SURGERY     INTRAOPERATIVE TRANSESOPHAGEAL ECHOCARDIOGRAM  10/05/2015   Procedure: INTRAOPERATIVE TRANSESOPHAGEAL ECHOCARDIOGRAM;  Surgeon: Rexene Alberts, MD;  Location: [facility];  Service: Open Heart Surgery;;   JOINT REPLACEMENT     shoulder   LEFT HEART CATHETERIZATION WITH CORONARY ANGIOGRAM N/A 11/16/2014   Procedure: LEFT HEART CATHETERIZATION WITH CORONARY ANGIOGRAM;  Surgeon: Troy Sine, MD;  Location: [facility];  Service: Cardiovascular;  Laterality: N/A;   MITRAL VALVE REPAIR Right 10/04/2015   Procedure: MINIMALLY INVASIVE MITRAL VALVE REPAIR (MVR);  Surgeon: Rexene Alberts, MD;  Location: [facility];  Service: Open Heart Surgery;  Laterality: Right;   Rt rotator cuff repair     TEE WITHOUT CARDIOVERSION N/A 06/22/2015   Procedure: TRANSESOPHAGEAL ECHOCARDIOGRAM (TEE);  Surgeon: Skeet Latch, MD;  Location: [facility];  Service: Cardiovascular;  Laterality: N/A;   TEE WITHOUT CARDIOVERSION N/A 10/04/2015   Procedure: TRANSESOPHAGEAL ECHOCARDIOGRAM (TEE);  Surgeon: Valentina Gu  Roxy Manns, MD;  Location: [facility];  Service: Open Heart Surgery;  Laterality: N/A;   WOUND EXPLORATION N/A 10/05/2015   Procedure: Sternal Incision;  Surgeon: Rexene Alberts, MD;  Location: [facility];  Service: Open Heart Surgery;  Laterality: N/A;   Current Outpatient Medications on File Prior to Visit  Medication Sig Dispense Refill   allopurinol (ZYLOPRIM) 100 MG tablet TAKE ONE (1) TABLET EACH DAY 90 tablet 2   apixaban (ELIQUIS) 5 MG TABS tablet Take 1 tablet (5 mg total) by mouth 2 (two) times daily. 60 tablet 5   benzonatate (TESSALON) 200 MG capsule Take 1 capsule (200 mg total) by mouth 2 (two) times daily as needed for cough. 30 capsule 2   bumetanide (BUMEX) 2 MG tablet Take 2 tablets (4 mg total) by mouth 2 (two) times daily. 180 tablet 0   cyclobenzaprine (FLEXERIL) 5 MG tablet Take  1 tablet (5 mg total) by mouth 3 (three) times daily as needed for muscle spasms. 30 tablet 0   docusate sodium (COLACE) 100 MG capsule Take 1 capsule (100 mg total) by mouth daily as needed for moderate constipation. 60 capsule 2   finasteride (PROSCAR) 5 MG tablet Take 1 tablet (5 mg total) by mouth at bedtime. 90 tablet 2   loratadine (CLARITIN) 10 MG tablet Take 1 tablet (10 mg total) by mouth daily as needed for allergies. 30 tablet 3   metoprolol tartrate (LOPRESSOR) 25 MG tablet Take 0.5 tablets (12.5 mg total) by mouth 2 (two) times daily. 90 tablet 3   OVER THE COUNTER MEDICATION [place]: Take 1 capsule by mouth once a day for immune health     pravastatin (PRAVACHOL) 40 MG tablet TAKE 1 TABLET BY MOUTH EVERY DAY AT 6PM 30 tablet 9   spironolactone (ALDACTONE) 25 MG tablet Take 1 tablet (25 mg total) by mouth daily. 90 tablet 3   traMADol (ULTRAM) 50 MG tablet TAKE ONE TABLET BY MOUTH EVERY 6 HOURS AS NEEDED FOR MODERATE PAIN 30 tablet 0   triamcinolone cream (KENALOG) 0.1 % Apply 1 application topically 2 (two) times daily. 30 g 0   No current facility-administered medications on file prior to visit.    No Known Allergies Social History   Socioeconomic History   Marital status: Married    Spouse name: Not on file   Number of children: 4   Years of education: Not on file   Highest education level: Not on file  Occupational History    Employer: RETIRED  Social Needs   Financial resource strain: Not on file   Food insecurity:    Worry: Not on file    Inability: Not on file   Transportation needs:    Medical: Not on file    Non-medical: Not on file  Tobacco Use   Smoking status: Former Smoker    Types: Cigarettes, Pipe, Cigars   Smokeless tobacco: Former Systems developer    Types: Chew   Tobacco comment: [place] YEARS AGO"  Substance and Sexual Activity   Alcohol use: No   Drug use: No   Sexual activity: Not on file  Lifestyle   Physical  activity:    Days per week: Not on file    Minutes per session: Not on file   Stress: Not on file  Relationships   Social connections:    Talks on phone: Not on file    Gets together: Not on file    Attends religious service: Not on file  Active member of club or organization: Not on file    Attends meetings of clubs or organizations: Not on file    Relationship status: Not on file   Intimate partner violence:    Fear of current or ex partner: Not on file    Emotionally abused: Not on file    Physically abused: Not on file    Forced sexual activity: Not on file  Other Topics Concern   Not on file  Social History Narrative   ** Merged History Encounter **       Four adopted children.  Lives alone.       Review of Systems  All other systems reviewed and are negative.      Objective:   Physical Exam  Constitutional: He appears well-developed and well-nourished. No distress.  Cardiovascular: Normal rate. An irregularly irregular rhythm present.  Murmur heard. Pulmonary/Chest: Effort normal. No respiratory distress. He has no wheezes. He has no rhonchi. He has rales in the left middle field and the left lower field.      Abdominal: Soft. Bowel sounds are normal. He exhibits no distension. There is no abdominal tenderness. There is no rebound. Hernia confirmed negative in the right inguinal area and confirmed negative in the left inguinal area.  Genitourinary:    Testes normal.  Uncircumcised. No penile tenderness. No discharge found.  Musculoskeletal:        General: Edema present.     Right upper leg: He exhibits tenderness. He exhibits no swelling and no edema.     Right lower leg: Edema present.     Left lower leg: Edema present.  Lymphadenopathy:       Right: No inguinal adenopathy present.       Left: No inguinal adenopathy present.  Skin: He is not diaphoretic.  Vitals reviewed.      Assessment & Plan:  Cough  Hematuria, unspecified  type  Anticoagulated  We discussed his options including going to the hospital for IV fluids however the patient refuses to go to the hospital today.  Given his relatively low blood pressure I have recommended discontinuing his metoprolol altogether.  I have also recommended holding his Eliquis.  I will recheck with the patient via telephone tomorrow afternoon.  If the bleeding has subsided I will start the patient back on Eliquis 2.5 mg twice daily.  Although this is not the treatment dose for DVT, I feel that a compromise has to be made however the patient has gross frank hematuria and therefore we need to hold his anticoagulation at the present time.  I will also start the patient on Levaquin 500 mg p.o. daily given the congestion appearing in his left lower lobe, possible community-acquired pneumonia.  If situation worsens tonight they will go to the emergency room.  Patient agrees with this plan.

## 2018-11-20 ENCOUNTER — Other Ambulatory Visit: Payer: Self-pay

## 2018-11-20 ENCOUNTER — Ambulatory Visit (INDEPENDENT_AMBULATORY_CARE_PROVIDER_SITE_OTHER): Payer: Medicare Other | Admitting: Family Medicine

## 2018-11-20 DIAGNOSIS — Z7901 Long term (current) use of anticoagulants: Secondary | ICD-10-CM

## 2018-11-20 DIAGNOSIS — R319 Hematuria, unspecified: Secondary | ICD-10-CM

## 2018-11-20 NOTE — Progress Notes (Signed)
Subjective:    Patient ID: [name], male    DOB: [birth year], 85 y.o.   MRN: [id number]  HPI   09/10/18 I reviewed the patient's recent visit with his cardiologist.  Since that time, his legs have started to swell dramatically.  He has +2 pitting edema in his right leg up to his knee.  He is unable to wear his compression hose due to the swelling.  He also has +2 pitting edema in his left leg up to his knee.  His legs are so swollen that they hurt.  He also reports dyspnea with exertion which is mild.  He denies any chest pain.  He denies any syncope.  He denies any lightheadedness.  His blood pressure today however is extremely soft at 88/60.  I confirmed this personally.  However he has bibasilar crackles in both lungs dusting fluid overload despite his weight essentially remaining stable.  At that time, my plan was: Patient's exam today is markedly different than his exam in early January.  He now has pitting edema and rales on pulmonary exam.  He appears fluid overloaded today.  Furthermore his blood pressure is low.  We discussed going to the hospital for diuresis.  The patient feels fine other than pain in his legs due to the swelling and mild shortness of breath with activity.  He wants to avoid the hospital due to the exposure to flu and other illnesses which I agree would likely kill him if he contracted a secondary infection.  Therefore we will discontinue metoprolol to avoid hypotension after diuresis and increase his Lasix to 3 pills twice daily.  I will see the patient back first thing tomorrow.  Consider using compression Unna boots tomorrow if necessary to help control the swelling.  Explained to the patient and his wife that I am concerned he may be showing worsening signs of congestive heart failure and that this could be a sign that his condition is becoming more serious and potentially life-threatening.  Patient understands and elects to go home.  He will follow-up with me tomorrow  and we will attempt outpatient diuresis.  Consider hospice referral if the patient continues to deteriorate  09/11/18 Patient's blood pressure is now improved discontinue metoprolol.  However despite increasing Lasix there has not been substantial improvement in the swelling in his legs.  I believe the patient is more third spacing then fluid overloaded.  Because his legs are swollen so bad he is unable to wear his compression hose and therefore the third spacing and pitting edema in his legs continues to worsen.  He denies any dizziness or syncope or chest pain or shortness of breath.  At that time, my plan was: Continue Lasix 3 tablets twice daily however I placed the patient in bilateral Unna boots today in an effort to help with the third spacing to drive the pitting edema from the interstitial tissues back into the blood vessels where the diuretics can be more effective.  We will see the patient back on Monday to remove the Unna boots and at that time we will switch him back to his compression hose if the swelling has improved.  Resume previous dose of Lasix, 2 pills twice a day on Sunday to avoid dehydration.  If swelling is better Monday, will resume the metoprolol at that time after we have decreased his Lasix  09/14/18 Patient is here today to follow-up.  I removed his Unna boots.  The swelling had improved dramatically in  both feet and is down to normal now.  Patient brings in a brand-new pair of compression hose for me to apply to his legs today.  I was able to put the patient back into his compression support hose today after I remove the Unna boots.  His blood pressure is also better and now appears stable to resume metoprolol especially given that he is no longer taking the elevated dose of Lasix.  At that time, my plan was: I remove the patient's Unna boots.  He was placed in compression hose bilaterally.  Continue to wear compression hose on a daily basis to control swelling.  Resume previous  dose of Lasix 80 mg twice daily.Marland Kitchen  Resume metoprolol 12.5 mg p.o. twice daily  10/08/18 Wt Readings from Last 3 Encounters:  11/17/18 210 lb (95.3 kg)  11/05/18 210 lb (95.3 kg)  11/02/18 216 lb (98 kg)   Patient presents today with swelling in both legs right greater than left.  He has +2 pitting edema in his right leg and +1 pitting edema in his left leg up to his knee.  Patient denies any chest pain.  He has chronic shortness of breath is no different from his baseline.  Weight is increased 4 pounds since 1 month ago.  He is taking lasix 120 mg in the morning and 80 mg in the evening and has not achieved any successful diuresis.  He has stopped wearing compression hose due to the pain in his legs.  Stopped the compression hose the day after I last saw him.  AT that time, my plan was: Discontinue Lasix and switch to Bumex 2 mg p.o. twice daily.  Recheck via telephone tomorrow.  Will increase to 4 mg p.o. twice daily if the patient has not experienced any diuresis and will recheck the patient on Monday or sooner if worse.  Continue current potassium dose  10/12/18 Patient has seen no benefit since switching from Lasix to Bumex 2 mg twice daily.  He continues to have significant edema in his right leg greater than his left leg.  It is still tender to palpation in both his right and left leg.  He also has dyspnea on exertion and he has left basilar crackles appreciated on today's exam.  At that time, my plan was: Increase Bumex to 4 mg twice daily.  He is not on any potassium replacement despite this being on his medication list.  His wife states that he is not taking any potassium.  Therefore I will check his BMP and adjust his potassium replacement if necessary.  Reassess on Thursday.  While the patient was here, I checked his INR.  INR is therapeutic at 2.6 with a PTT of 31.7.  We will make no changes in his Coumadin dose.  Reassess via telephone on Thursday.  10/15/18 Unfortunately, leg swelling does  not seem any better.  In fact it appears to be worse.  Pitting edema in the right leg is +2.  Now pitting edema in the left leg is approaching +2 all the way up to just below the knee.  The patient's skin hurts simply from the stretch.  He reports aching and throbbing in his legs.  Yesterday was particularly bad.  Today is a little bit better.  Unfortunately he is unable to wear any compression hose to prevent third spacing.  He is no longer eating very well.  He is only drinking water.  I believe the protein calorie malnutrition is likely decreasing the intravascular  oncotic pressure and leading to worsening third spacing.  On his most recent BMP, his renal function was deteriorating showing depleted intravascular volume.  Therefore I am unable to increase his diuretic dose to improve the third spacing.  At that time, my plan was: Patient has significant leg swelling due to chronic venous insufficiency as well as chronic diastolic congestive heart failure.  Now due to protein calorie malnutrition, he is losing intravascular oncotic pressure contributing to the third spacing and worsening the edema.  He is unable to wear compression hose at home which is only exacerbating the problem and oral diuretics are approaching prerenal azotemia.  Therefore I placed the patient in bilateral Unna boots today and will recheck the patient on Monday.  If this is helping, I will start having home health nursing apply the Unna boots once a week for 3 or 4 days and then remove the Unna boots in an effort to try to maintain and control his leg swelling.  Patient is unable to wear compression hose but we need to do some type of compressive therapy to help manage and control his leg swelling.  He has current diuretic dose.  Encouraged protein supplement such as Ensure 2 to 3 cans a day due to his poor appetite.  10/19/18 Swelling has improved dramatically in both legs.  He has no edema distal to his knee and his left leg.  There is  some +1 pitting edema in his foot past the ankle down around the toes and the dorsal forefoot.  He has trace to +1 edema in his right leg distal to the knee.  He has +1 edema in the forefoot past the ankle.  Legs feel much better.  He is lost 2 pounds.  Unna boots were removed today.  However the patient is unable to change his compression hose.  His wife is not strong enough to put his compression hose on.  The patient is not strong enough to put his compression hose on.  Without compression therapy, the swelling will quickly return.  I explained this to the patient in detail.  I believe he would benefit from having home health nurse come out once a week and placing him in an Unna boot and then removing it 3 days later and then going 3 days without the Unna boot to try to moderate and control the swelling.  Patient is willing to try this.  At that time, my plan was: Leg swelling has improved dramatically status post Unna boots.  These were removed today.  However as I explained to the patient, the swelling will rapidly return without some type of compressive therapy.  He is not physically able to put on compression hose.  His wife is not physically able to put on compression hose.  They simply remove the socks usually after 1 day and then never put them on again.  Diuretics alone are unable to control swelling.  Therefore I have recommended home health come to his house and apply the Unna boots once a week for approximately 3 days and then remove the Unna boots.  Perhaps we do this weekly we can control swelling.  Patient could also come here to the office however is a tremendous hardship for his wife as the patient is barely able to stand and she is not strong enough to get him in and out of the wheelchair at home.  Therefore I will see if home health can come do this to help manage the leg  swelling.  The pain in his legs have improved dramatically now that we have improved the edema  10/28/18 Home health  nurses have not been able to come out to his home due to the coronavirus pandemic.  Patient is unable to wear compression hose.  As result, as anticipated, the swelling has quickly returned to both of his legs.  He has +2 pitting edema in his right leg up to his knee.  At times his leg is weeping from shallow small lesions on the anterior surface of his shin.  He has +1 pitting edema in his left leg up to his knee.  Both legs are aching due to the swelling and the pain.  At that time, my plan was: Patient was placed in Unna boots bilaterally as before to control the swelling in his legs.  We will change the dressings on Monday.  Until the home health nurses can come out to his house and start doing these once a week, we will have to perform the service here in order to control the swelling in his legs is neither the patient or his wife are able to apply compression hose and without compression therapy, his legs quickly swell.  11/02/18 Patient was originally scheduled today to have the Unna boot replaced on his right leg.  However over the weekend he developed severe pain in his right leg.  History is difficult to obtain due to the patient's manner of speech.  However as best I can ascertain, the patient has had severe pain beginning in the posterior aspect of his right hip radiating down his leg into his right lower leg.  The compression wraps made the pain worse per tickly distal to the knee.  There was so much swelling in his right leg that his wife had to remove the compression wrap from his right leg on Sunday as his leg was swelling above the compression wrap causing significant pain.  There is +2 edema in the right leg today from his ankle all the way up to above his knee.  However he is also having sharp severe pain in his right posterior hip.  He denies any falls or injuries.  He is unable to characterize the pain is burning or radiating or shooting.  He describes it more as a stabbing pain.  It hurts  for me to palpation his posterior iliac crest.  He also has tenderness to palpation over the greater trochanter in the right hip.  He has a positive Homans sign today he also has pain with compression of his upper thigh.  Pain is significant enough that it hurts to bear weight all the way down his leg.  At that time, my plan was: Lungs are relatively clear today.  I do not believe this is fluid overload from congestive heart failure.  Given the severity of the pain in his right leg I am concerned about a hip fracture, lumbar radiculopathy/sciatica, or DVT particular given the significant swelling in his right leg.  I will send the patient urgently for a venous ultrasound to rule out a DVT.  I will also obtain an x-ray of his right hip and pelvis to rule out a hip fracture or other skeletal pathology.  If the x-ray is negative for any hip fracture or bony pathology and the ultrasound is negative for a DVT, I will place the patient on a prednisone taper pack for presumed lumbar radiculopathy and resume compression wraps and diuresis.  Ultrasound confirmed new near  total occlusion DVT in the right popliteal vein.  Plan was made to discontinue Coumadin and replace with Eliquis 10 mg twice a day for the first week followed by 5 mg twice daily thereafter.  I will reassess the patient on Thursday.  Patient was given samples of Eliquis to begin taking immediately.  Also recommended that he elevate his leg as much as possible to allow the swelling to decrease.  Reassess the patient on Thursday.  11/05/18  Patient presents today for follow-up.  He states that the pain in his right leg distal to his right knee has improved after only being on Eliquis for 2 days.  He continues to have extensive +2 edema from his knee to his ankle.  The swelling in his leg has not changed perceptibly over the last 2 days however the patient has seen significant improvement in the pain that was located around his knee and in his upper calf  suggesting that the Eliquis is successfully treating the DVT.  We had discussed using Lovenox instead of Eliquis after he had the results 2 days ago.  Patient had wanted to try Eliquis first.  Given the fact that the pain is improving, together we decided to continue Eliquis indefinitely however we will need to monitor for progression.  If the swelling seems to worsen in his right leg and the pain worsens, I would quickly switch the patient to Lovenox.  I explained the unusual situation that the patient is in and that he formed a DVT while taking Coumadin at apparently a therapeutic dose.  Patient is comfortable continuing Eliquis at the present time.  However I do not believe the pain in his posterior right hip is related to the DVT.  X-ray showed severe degenerative disc disease in his lower back.  The patient continues to have a shooting-like pain in his posterior right hip suggesting lumbar radiculopathy as a likely cause.  There was no significant arthritis or fracture seen on the x-ray of his right hip.  At that time, my plan was: I believe the pain and the swelling in the patient's right leg from his knee to his foot was due to the acute DVT in his right leg.  We will continue Eliquis and ultimately start the patient on 5 mg twice daily indefinitely.  If the swelling worsens or the pain worsens, I would switch the patient to Lovenox 80 mg subcu twice daily indefinitely.  I explained in detail the unique situation to the patient and his wife and they are comfortable with this plan especially given how much the pain has improved over the last 2 days since starting Eliquis.  I believe the pain in his posterior right hip is likely due to lumbar radiculopathy given the x-ray findings.  Therefore we will try the patient on a prednisone taper pack as he is unable to take NSAIDs due to his anticoagulation.  He can use tramadol for breakthrough pain.  I suspect that the swelling in his leg will gradually improve over  the next 1 to 2 months as the DVT begins to recanalize.  Recheck in 3 months or as needed  11/17/18 Wife called yesterday concerned that the patient may have pneumonia.  According to her report he was coughing terribly.  Given his multiple medical comorbidities, I felt it was necessary to evaluate the patient visibly to determine the etiology of the cough.  Patient denies any chest pain.  He denies any new shortness of breath.  He denies  any pleurisy.  He states that he gets a cough usually at night which is phlegm and congestion that hangs in his upper throat and upper airways.  He coughs hard to try to bring it up and sometimes he gags on the phlegm.  He denies any wheezing.  He denies any pleurisy.  He denies any hemoptysis.  He denies any fevers or chills.  He states that the cough has gone on ever since he had gallbladder surgery in December 2017.  He states that the cough is not changing.  At that time, my plan was: Patient also complains about leg swelling which is likely due to his chronic venous insufficiency exacerbated by his acute DVT.  He cannot tolerate compression hose.  He does not tolerate Unna boots.  Therefore I have given the patient an Ace bandage and have asked his wife to wrap his legs similar to an Haematologist every day.  He can vary the pressure for his own level of comfort.  I believe any type of compression will only help with the swelling.  Continue to use Bumex 4 mg twice daily as the patient appears euvolemic.  I believe the patient also has a chronic cough.  At this point I see no evidence of pneumonia.  I recommended using Mucinex twice daily as a mucolytic and then using Tessalon Perles 200 mg every 8 hours as needed for cough.  Patient has had imaging of the chest several times since his gallbladder surgery in 2017 with no obvious neoplastic process causing the cough.  If the cough worsens, consider adding a PPI for laryngo-esophageal reflux and repeating a chest x-ray.  Last chest  x-ray was in July 2019 and at that time it only showed chronic scarring in the right base but no neoplastic process.  11/19/18 Patient's cough is worsened.  This morning he had an episode of trace hemoptysis with the cough.  He still has chest congestion despite taking Mucinex.  He denies any fevers.  Oxygen saturation is 100% on room air however he now has left-sided pronounced crackles in the base that was not present earlier this week concerning for developing pneumonia.  Also this morning he developed gross hematuria.  On examination today, the patient is an uncircumcised male.  There is dry blood coming from the urethral meatus.  There is also approximately 1 to 2 mL of dried blood inside his underwear.  The patient's wife states that he has had to change his pad 1 time today due to bleeding and that he was peeing blood earlier this morning.  Therefore he has gross hematuria.  He denies any dysuria or fever.  He denies any pelvic pain.  Hematuria is painless.  Patient is supposed to be on Eliquis 5 mg twice a day for his acute right leg DVT.  However the patient's wife never decreased his dose.  She is still giving him 10 mg twice daily.  At that time, my plan was:  We discussed his options including going to the hospital for IV fluids however the patient refuses to go to the hospital today.  Given his relatively low blood pressure I have recommended discontinuing his metoprolol altogether.  I have also recommended holding his Eliquis.  I will recheck with the patient via telephone tomorrow afternoon.  If the bleeding has subsided I will start the patient back on Eliquis 2.5 mg twice daily.  Although this is not the treatment dose for DVT, I feel that a compromise has  to be made however the patient has gross frank hematuria and therefore we need to hold his anticoagulation at the present time.  I will also start the patient on Levaquin 500 mg p.o. daily given the congestion appearing in his left lower lobe,  possible community-acquired pneumonia.  If situation worsens tonight they will go to the emergency room.  Patient agrees with this plan.  11/20/18 Patient is being seen today for follow-up.  He is being seen over the telephone.  The patient is currently at home with his wife.  I am currently in my office.  Phone call began at 2:00.  Phone call ended at 211.  Patient's wife does all the speaking over the telephone due to the patient's difficulty speaking and hoarseness.  She states that he is feeling better this morning.  He has not experienced any further bleeding since yesterday after they discontinued his blood thinner.  She states that there may have been 1 drop of blood in the toilet after he urinated this morning.  There have been no other signs of bleeding on the pad that he was keeping in his underwear.  However he has held Eliquis since I saw him yesterday.  They are continuing to hold metoprolol.  The patient is unable to check his blood pressure as I do not have a blood pressure cuff however he is feeling better and less dizzy.  His cough is also improving.  They deny any shortness of breath.  Past Medical History:  Diagnosis Date   Atelectasis of right lung    Chronic diastolic (congestive) heart failure ([place])    a. 10/2013 Echo: EF 55-60%, basal-mid anteroseptal and basal-mid inferoseptal HK; b. 02/2017 Echo: EF 60-65%, Gr3 DD.   Chronic fatigue    Chronic subdural hematoma ([place])    a. 12/2015 Head CT: chronic right holo-hemispheric SDH w/o midline shift.   CKD (chronic kidney disease), stage III ([place])    Coronary artery disease    a. 11/2014 NSTEMI: DESx 2 placed to LAD and RCA; b. 08/2015 Cath: LAD patent stent, LCX 75m, RCA patent stent, 31m.   Diabetes mellitus, type II ([place])    a. HgA1c 6.8 in 03/2015   ED (erectile dysfunction)    Gout    Hemangioma of liver 08/02/2015   12 mm enhancing lesion seen on CT - suspicious for benign hemangioma but not diagnostic - MRI recommended    HLD (hyperlipidemia)    Hypertension    Hypertensive heart disease    Ischemic finger ulcer ([place])    a. 03/2017 Seen for digital ulcers - felt to be embolic in setting of PAF. Pt on coumadin.   Kidney stones    Lower extremity edema    a. chronic   OA (osteoarthritis)    Orthostatic hypotension    PAF (paroxysmal atrial fibrillation) ([place])    a. 10/2015 s/p DCCV;  b. CHA2DS2VASc = 6--> on coumadin; c. 10/2015 Echo: sev dil LA.   Prostate cancer ([place])    Pulmonary HTN ([place])    Pulmonary hypertension ([place])    a. 10/2015 Echo: PASP 85mmHg.   Renal insufficiency    Severe mitral regurgitation    a.  10/2015 s/p minimally invasive MV repair; b. 10/2015 Echo: mod MS, mean grad 70mmHg; c. 02/2017 Echo: EF 60-65%, Gr3 DD, mod Ca2+ MV annulus, mod to sev dil LA, mod dil RA, mod TR. PASP 91mmHg.   Spinal stenosis    SVT (supraventricular tachycardia) ([facility])    a. recurrent,  usually responsive to vagal manuevers   Tricuspid regurgitation    a. 10/2015 Echo: mild to mod TR.   Past Surgical History:  Procedure Laterality Date   BACK SURGERY     Back surgery x 2     CARDIAC CATHETERIZATION N/A 08/08/2015   Procedure: Right/Left Heart Cath and Coronary Angiography;  Surgeon: Sherren Mocha, MD;  Location: [facility];  Service: Cardiovascular;  Laterality: N/A;   CARDIAC SURGERY     2 cardiac stents   CARDIOVERSION N/A 10/20/2015   Procedure: CARDIOVERSION;  Surgeon: Pixie Casino, MD;  Location: [facility];  Service: Cardiovascular;  Laterality: N/A;   CERVICAL SPINE SURGERY     CHOLECYSTECTOMY N/A 07/08/2016   Procedure: LAPAROSCOPIC CHOLECYSTECTOMY WITH INTRAOPERATIVE CHOLANGIOGRAM;  Surgeon: Georganna Skeans, MD;  Location: [facility];  Service: General;  Laterality: N/A;   CORONARY ARTERY BYPASS GRAFT     ESOPHAGOGASTRODUODENOSCOPY (EGD) WITH PROPOFOL N/A 03/19/2018   Procedure: ESOPHAGOGASTRODUODENOSCOPY (EGD) WITH PROPOFOL;  Surgeon: [name], MD;  Location: [facility]  ENDOSCOPY;  Service: Gastroenterology;  Laterality: N/A;   EYE SURGERY     INTRAOPERATIVE TRANSESOPHAGEAL ECHOCARDIOGRAM  10/05/2015   Procedure: INTRAOPERATIVE TRANSESOPHAGEAL ECHOCARDIOGRAM;  Surgeon: Rexene Alberts, MD;  Location: [facility];  Service: Open Heart Surgery;;   JOINT REPLACEMENT     shoulder   LEFT HEART CATHETERIZATION WITH CORONARY ANGIOGRAM N/A 11/16/2014   Procedure: LEFT HEART CATHETERIZATION WITH CORONARY ANGIOGRAM;  Surgeon: Troy Sine, MD;  Location: [facility] C[facility] LAB;  Service: Cardiovascular;  Laterality: N/A;   MITRAL VALVE REPAIR Right 10/04/2015   Procedure: MINIMALLY INVASIVE MITRAL VALVE REPAIR (MVR);  Surgeon: Rexene Alberts, MD;  Location: [facility];  Service: Open Heart Surgery;  Laterality: Right;   Rt rotator cuff repair     TEE WITHOUT CARDIOVERSION N/A 06/22/2015   Procedure: TRANSESOPHAGEAL ECHOCARDIOGRAM (TEE);  Surgeon: Skeet Latch, MD;  Location: [facility];  Service: Cardiovascular;  Laterality: N/A;   TEE WITHOUT CARDIOVERSION N/A 10/04/2015   Procedure: TRANSESOPHAGEAL ECHOCARDIOGRAM (TEE);  Surgeon: Rexene Alberts, MD;  Location: [place];  Service: Open Heart Surgery;  Laterality: N/A;   WOUND EXPLORATION N/A 10/05/2015   Procedure: Sternal Incision;  Surgeon: Rexene Alberts, MD;  Location: [facility];  Service: Open Heart Surgery;  Laterality: N/A;   Current Outpatient Medications on File Prior to Visit  Medication Sig Dispense Refill   allopurinol (ZYLOPRIM) 100 MG tablet TAKE ONE (1) TABLET EACH DAY 90 tablet 2   apixaban (ELIQUIS) 5 MG TABS tablet Take 1 tablet (5 mg total) by mouth 2 (two) times daily. 60 tablet 5   benzonatate (TESSALON) 200 MG capsule Take 1 capsule (200 mg total) by mouth 2 (two) times daily as needed for cough. 30 capsule 2   bumetanide (BUMEX) 2 MG tablet Take 2 tablets (4 mg total) by mouth 2 (two) times daily. 180 tablet 0   cyclobenzaprine (FLEXERIL) 5 MG tablet Take 1 tablet (5 mg total) by mouth 3 (three) times daily  as needed for muscle spasms. 30 tablet 0   docusate sodium (COLACE) 100 MG capsule Take 1 capsule (100 mg total) by mouth daily as needed for moderate constipation. 60 capsule 2   finasteride (PROSCAR) 5 MG tablet Take 1 tablet (5 mg total) by mouth at bedtime. 90 tablet 2   levofloxacin (LEVAQUIN) 500 MG tablet Take 1 tablet (500 mg total) by mouth daily. 7 tablet 0   loratadine (CLARITIN) 10 MG tablet Take 1 tablet (10 mg total)  by mouth daily as needed for allergies. 30 tablet 3   metoprolol tartrate (LOPRESSOR) 25 MG tablet Take 0.5 tablets (12.5 mg total) by mouth 2 (two) times daily. 90 tablet 3   OVER THE COUNTER MEDICATION Neuroquell: Take 1 capsule by mouth once a day for immune health     pravastatin (PRAVACHOL) 40 MG tablet TAKE 1 TABLET BY MOUTH EVERY DAY AT 6PM 30 tablet 9   spironolactone (ALDACTONE) 25 MG tablet Take 1 tablet (25 mg total) by mouth daily. 90 tablet 3   traMADol (ULTRAM) 50 MG tablet TAKE ONE TABLET BY MOUTH EVERY 6 HOURS AS NEEDED FOR MODERATE PAIN 30 tablet 0   triamcinolone cream (KENALOG) 0.1 % Apply 1 application topically 2 (two) times daily. 30 g 0   No current facility-administered medications on file prior to visit.    No Known Allergies Social History   Socioeconomic History   Marital status: Married    Spouse name: Not on file   Number of children: 4   Years of education: Not on file   Highest education level: Not on file  Occupational History    Employer: RETIRED  Social Needs   Financial resource strain: Not on file   Food insecurity:    Worry: Not on file    Inability: Not on file   Transportation needs:    Medical: Not on file    Non-medical: Not on file  Tobacco Use   Smoking status: Former Smoker    Types: Cigarettes, Pipe, Cigars   Smokeless tobacco: Former Systems developer    Types: Chew   Tobacco comment: [place] YEARS AGO"  Substance and Sexual Activity   Alcohol use: No   Drug use: No   Sexual  activity: Not on file  Lifestyle   Physical activity:    Days per week: Not on file    Minutes per session: Not on file   Stress: Not on file  Relationships   Social connections:    Talks on phone: Not on file    Gets together: Not on file    Attends religious service: Not on file    Active member of club or organization: Not on file    Attends meetings of clubs or organizations: Not on file    Relationship status: Not on file   Intimate partner violence:    Fear of current or ex partner: Not on file    Emotionally abused: Not on file    Physically abused: Not on file    Forced sexual activity: Not on file  Other Topics Concern   Not on file  Social History Narrative   ** Merged History Encounter **       Four adopted children.  Lives alone.       Review of Systems  All other systems reviewed and are negative.      Objective:  No physical exam was performed today as the patient was being seen over the telephone.  Total phone time was approximately 11 minutes    Assessment & Plan:  Hematuria, unspecified type  Anticoagulated  Gross hematuria has subsided.  I have asked the patient to resume Eliquis.  He was previously taking 10 mg twice a day incorrectly.  I will have him resume 2.5 mg twice a day over the weekend to avoid the significant bleeding that he was experiencing yesterday.  I will recheck with him via telephone on Monday.  Patient's wife is to call me Monday.  If there has not been any additional bleeding, I would increase his dose of Eliquis back to 5 mg twice a day.  I have instructed them to continue to refrain from using metoprolol at the current time due to his low blood pressure.  Continue his current dose of Bumex however at 4 mg twice a day to maintain euvolemia.

## 2018-11-26 ENCOUNTER — Telehealth: Payer: Self-pay | Admitting: Family Medicine

## 2018-11-26 NOTE — Telephone Encounter (Signed)
[phone] Mrs bartus calling to check to see if mr Shimon is supossed to be taking a half or a whole elequis at this point, would like a call back before 12 today if possible.

## 2018-11-26 NOTE — Telephone Encounter (Signed)
Pt's wife aware per Dr. Dennard Schaumann restart eliquis 5 mg bid.

## 2018-12-04 ENCOUNTER — Ambulatory Visit (INDEPENDENT_AMBULATORY_CARE_PROVIDER_SITE_OTHER): Payer: Medicare Other | Admitting: Family Medicine

## 2018-12-04 ENCOUNTER — Other Ambulatory Visit: Payer: Self-pay

## 2018-12-04 VITALS — BP 130/88 | HR 85 | Temp 97.4°F | Resp 18

## 2018-12-04 DIAGNOSIS — I5032 Chronic diastolic (congestive) heart failure: Secondary | ICD-10-CM | POA: Diagnosis not present

## 2018-12-04 DIAGNOSIS — M7989 Other specified soft tissue disorders: Secondary | ICD-10-CM

## 2018-12-04 DIAGNOSIS — I872 Venous insufficiency (chronic) (peripheral): Secondary | ICD-10-CM

## 2018-12-04 NOTE — Progress Notes (Signed)
Subjective:    Patient ID: [name], male    DOB: [birth year], 85 y.o.   MRN: [id number]  HPI Please see the patient's recent history.  Patient has a history of chronic leg swelling secondary to chronic venous insufficiency, chronic congestive heart failure, and recently has been diagnosed with a right-sided DVT.  He is currently on Eliquis having failed Coumadin.  He is taking the medication 5 mg twice a daily as prescribed.  He is taking Bumex 4 mg twice daily for leg swelling.  Over the last few days, his legs have swollen dramatically.  He has +2 pitting edema in the right leg borderline +3 edema all the way up to his knee.  He has +2 pitting edema in his left leg as well up to his knee.  The legs are tense and swollen and extremely painful to the touch.  There are small vesicles forming on the anterior shins bilaterally.  There is bruising and erythema surrounding the posterior calves of both legs.  The skin is exquisitely tender to touch.  However his lungs are clear to auscultation bilaterally with no crackles.  He denies any shortness of breath or chest pain.  Swelling appears to be third spacing pitting edema. Past Medical History:  Diagnosis Date  . Atelectasis of right lung   . Chronic diastolic (congestive) heart failure ([place])    a. 10/2013 Echo: EF 55-60%, basal-mid anteroseptal and basal-mid inferoseptal HK; b. 02/2017 Echo: EF 60-65%, Gr3 DD.  Marland Kitchen Chronic fatigue   . Chronic subdural hematoma ([place])    a. 12/2015 Head CT: chronic right holo-hemispheric SDH w/o midline shift.  . CKD (chronic kidney disease), stage III ([place])   . Coronary artery disease    a. 11/2014 NSTEMI: DESx 2 placed to LAD and RCA; b. 08/2015 Cath: LAD patent stent, LCX 63m, RCA patent stent, 11m.  . Diabetes mellitus, type II ([place])    a. HgA1c 6.8 in 03/2015  . ED (erectile dysfunction)   . Gout   . Hemangioma of liver 08/02/2015   12 mm enhancing lesion seen on CT - suspicious for benign hemangioma but not  diagnostic - MRI recommended  . HLD (hyperlipidemia)   . Hypertension   . Hypertensive heart disease   . Ischemic finger ulcer ([place])    a. 03/2017 Seen for digital ulcers - felt to be embolic in setting of PAF. Pt on coumadin.  . Kidney stones   . Lower extremity edema    a. chronic  . OA (osteoarthritis)   . Orthostatic hypotension   . PAF (paroxysmal atrial fibrillation) ([facility])    a. 10/2015 s/p DCCV;  b. CHA2DS2VASc = 6--> on coumadin; c. 10/2015 Echo: sev dil LA.  . Prostate cancer ([place])   . Pulmonary HTN ([place])   . Pulmonary hypertension ([place])    a. 10/2015 Echo: PASP 91mmHg.  Marland Kitchen Renal insufficiency   . Severe mitral regurgitation    a.  10/2015 s/p minimally invasive MV repair; b. 10/2015 Echo: mod MS, mean grad 14mmHg; c. 02/2017 Echo: EF 60-65%, Gr3 DD, mod Ca2+ MV annulus, mod to sev dil LA, mod dil RA, mod TR. PASP 68mmHg.  Marland Kitchen Spinal stenosis   . SVT (supraventricular tachycardia) ([facility])    a. recurrent, usually responsive to vagal manuevers  . Tricuspid regurgitation    a. 10/2015 Echo: mild to mod TR.   Past Surgical History:  Procedure Laterality Date  . BACK SURGERY    . Back surgery x 2    .  CARDIAC CATHETERIZATION N/A 08/08/2015   Procedure: Right/Left Heart Cath and Coronary Angiography;  Surgeon: Sherren Mocha, MD;  Location: [facility];  Service: Cardiovascular;  Laterality: N/A;  . CARDIAC SURGERY     2 cardiac stents  . CARDIOVERSION N/A 10/20/2015   Procedure: CARDIOVERSION;  Surgeon: Pixie Casino, MD;  Location: [facility];  Service: Cardiovascular;  Laterality: N/A;  . CERVICAL SPINE SURGERY    . CHOLECYSTECTOMY N/A 07/08/2016   Procedure: LAPAROSCOPIC CHOLECYSTECTOMY WITH INTRAOPERATIVE CHOLANGIOGRAM;  Surgeon: Georganna Skeans, MD;  Location: [facility];  Service: General;  Laterality: N/A;  . CORONARY ARTERY BYPASS GRAFT    . ESOPHAGOGASTRODUODENOSCOPY (EGD) WITH PROPOFOL N/A 03/19/2018   Procedure: ESOPHAGOGASTRODUODENOSCOPY (EGD) WITH PROPOFOL;  Surgeon:  [name], MD;  Location: [facility] ENDOSCOPY;  Service: Gastroenterology;  Laterality: N/A;  . EYE SURGERY    . INTRAOPERATIVE TRANSESOPHAGEAL ECHOCARDIOGRAM  10/05/2015   Procedure: INTRAOPERATIVE TRANSESOPHAGEAL ECHOCARDIOGRAM;  Surgeon: Rexene Alberts, MD;  Location: [facility] OR;  Service: Open Heart Surgery;;  . JOINT REPLACEMENT     shoulder  . LEFT HEART CATHETERIZATION WITH CORONARY ANGIOGRAM N/A 11/16/2014   Procedure: LEFT HEART CATHETERIZATION WITH CORONARY ANGIOGRAM;  Surgeon: Troy Sine, MD;  Location: [facility] CATH LAB;  Service: Cardiovascular;  Laterality: N/A;  . MITRAL VALVE REPAIR Right 10/04/2015   Procedure: MINIMALLY INVASIVE MITRAL VALVE REPAIR (MVR);  Surgeon: Rexene Alberts, MD;  Location: [place];  Service: Open Heart Surgery;  Laterality: Right;  . Rt rotator cuff repair    . TEE WITHOUT CARDIOVERSION N/A 06/22/2015   Procedure: TRANSESOPHAGEAL ECHOCARDIOGRAM (TEE);  Surgeon: Skeet Latch, MD;  Location: [facility];  Service: Cardiovascular;  Laterality: N/A;  . TEE WITHOUT CARDIOVERSION N/A 10/04/2015   Procedure: TRANSESOPHAGEAL ECHOCARDIOGRAM (TEE);  Surgeon: Rexene Alberts, MD;  Location: [facility];  Service: Open Heart Surgery;  Laterality: N/A;  . WOUND EXPLORATION N/A 10/05/2015   Procedure: Sternal Incision;  Surgeon: Rexene Alberts, MD;  Location: [facility];  Service: Open Heart Surgery;  Laterality: N/A;   Current Outpatient Medications on File Prior to Visit  Medication Sig Dispense Refill  . allopurinol (ZYLOPRIM) 100 MG tablet TAKE ONE (1) TABLET EACH DAY 90 tablet 2  . apixaban (ELIQUIS) 5 MG TABS tablet Take 1 tablet (5 mg total) by mouth 2 (two) times daily. 60 tablet 5  . benzonatate (TESSALON) 200 MG capsule Take 1 capsule (200 mg total) by mouth 2 (two) times daily as needed for cough. 30 capsule 2  . bumetanide (BUMEX) 2 MG tablet Take 2 tablets (4 mg total) by mouth 2 (two) times daily. 180 tablet 0  . cyclobenzaprine (FLEXERIL) 5 MG tablet Take 1 tablet (5 mg  total) by mouth 3 (three) times daily as needed for muscle spasms. 30 tablet 0  . docusate sodium (COLACE) 100 MG capsule Take 1 capsule (100 mg total) by mouth daily as needed for moderate constipation. 60 capsule 2  . finasteride (PROSCAR) 5 MG tablet Take 1 tablet (5 mg total) by mouth at bedtime. 90 tablet 2  . levofloxacin (LEVAQUIN) 500 MG tablet Take 1 tablet (500 mg total) by mouth daily. 7 tablet 0  . loratadine (CLARITIN) 10 MG tablet Take 1 tablet (10 mg total) by mouth daily as needed for allergies. 30 tablet 3  . metoprolol tartrate (LOPRESSOR) 25 MG tablet Take 0.5 tablets (12.5 mg total) by mouth 2 (two) times daily. 90 tablet 3  . OVER THE COUNTER MEDICATION Neuroquell: Take 1 capsule by  mouth once a day for immune health    . pravastatin (PRAVACHOL) 40 MG tablet TAKE 1 TABLET BY MOUTH EVERY DAY AT 6PM 30 tablet 9  . spironolactone (ALDACTONE) 25 MG tablet Take 1 tablet (25 mg total) by mouth daily. 90 tablet 3  . traMADol (ULTRAM) 50 MG tablet TAKE ONE TABLET BY MOUTH EVERY 6 HOURS AS NEEDED FOR MODERATE PAIN 30 tablet 0  . triamcinolone cream (KENALOG) 0.1 % Apply 1 application topically 2 (two) times daily. 30 g 0   No current facility-administered medications on file prior to visit.    No Known Allergies Social History   Socioeconomic History  . Marital status: Married    Spouse name: Not on file  . Number of children: 4  . Years of education: Not on file  . Highest education level: Not on file  Occupational History    Employer: RETIRED  Social Needs  . Financial resource strain: Not on file  . Food insecurity:    Worry: Not on file    Inability: Not on file  . Transportation needs:    Medical: Not on file    Non-medical: Not on file  Tobacco Use  . Smoking status: Former Smoker    Types: Cigarettes, Pipe, Landscape architect  . Smokeless tobacco: Former Systems developer    Types: Chew  . Tobacco comment: QUIT SMOKING  MANY YEARS AGO"  Substance and Sexual Activity  . Alcohol use:  No  . Drug use: No  . Sexual activity: Not on file  Lifestyle  . Physical activity:    Days per week: Not on file    Minutes per session: Not on file  . Stress: Not on file  Relationships  . Social connections:    Talks on phone: Not on file    Gets together: Not on file    Attends religious service: Not on file    Active member of club or organization: Not on file    Attends meetings of clubs or organizations: Not on file    Relationship status: Not on file  . Intimate partner violence:    Fear of current or ex partner: Not on file    Emotionally abused: Not on file    Physically abused: Not on file    Forced sexual activity: Not on file  Other Topics Concern  . Not on file  Social History Narrative   ** Merged History Encounter **       Four adopted children.  Lives alone.       Review of Systems  All other systems reviewed and are negative.      Objective:   Physical Exam Vitals signs reviewed.  Constitutional:      Appearance: Normal appearance.  Cardiovascular:     Rate and Rhythm: Normal rate. Rhythm irregular.     Heart sounds: Murmur present.  Pulmonary:     Effort: No respiratory distress.     Breath sounds: No wheezing, rhonchi or rales.  Chest:     Chest wall: No tenderness.  Abdominal:     General: Abdomen is flat. Bowel sounds are normal.     Palpations: Abdomen is soft.  Musculoskeletal:        General: Tenderness present.     Right lower leg: Edema present.     Left lower leg: Edema present.  Neurological:     Mental Status: He is alert.           Assessment & Plan:  Chronic diastolic  CHF (congestive heart failure) (HCC)  Leg swelling  Both legs were placed in [place] boots bilaterally.  Reassess the patient on Monday.  If the swelling has not improved I will premedicate the patient with Zaroxolyn 30 minutes prior to taking his morning dose of Bumex to facilitate diuresis.  At the present time his lungs are clear to auscultation  bilaterally with no evidence of pulmonary edema.  I believe this is most likely third spacing coupled with his underlying DVT and chronic venous insufficiency rather than true congestive heart failure/fluid overload however if compression therapy does not help the leg swelling, I will increase his diuresis with Zaroxolyn prior to the Bumex. Wt Readings from Last 3 Encounters:  11/17/18 210 lb (95.3 kg)  11/05/18 210 lb (95.3 kg)  11/02/18 216 lb (98 kg)

## 2018-12-07 ENCOUNTER — Other Ambulatory Visit: Payer: Self-pay

## 2018-12-07 ENCOUNTER — Encounter: Payer: Self-pay | Admitting: Family Medicine

## 2018-12-07 ENCOUNTER — Ambulatory Visit (INDEPENDENT_AMBULATORY_CARE_PROVIDER_SITE_OTHER): Payer: Medicare Other | Admitting: Family Medicine

## 2018-12-07 VITALS — BP 96/60 | HR 98 | Temp 98.0°F | Resp 18 | Ht 67.0 in | Wt 203.0 lb

## 2018-12-07 DIAGNOSIS — M7989 Other specified soft tissue disorders: Secondary | ICD-10-CM

## 2018-12-07 DIAGNOSIS — R1310 Dysphagia, unspecified: Secondary | ICD-10-CM

## 2018-12-07 DIAGNOSIS — I5032 Chronic diastolic (congestive) heart failure: Secondary | ICD-10-CM

## 2018-12-07 DIAGNOSIS — R1319 Other dysphagia: Secondary | ICD-10-CM

## 2018-12-07 MED ORDER — PANTOPRAZOLE SODIUM 40 MG PO TBEC: 40.0000 mg | DELAYED_RELEASE_TABLET | Freq: Every day | ORAL | 3 refills | Status: AC

## 2018-12-07 NOTE — Progress Notes (Signed)
Subjective:    Patient ID: [name], male    DOB: 06/22/1929, 85 y.o.   MRN: [id number]  HPI  12/04/18 Please see the patient's recent history.  Patient has a history of chronic leg swelling secondary to chronic venous insufficiency, chronic congestive heart failure, and recently has been diagnosed with a right-sided DVT.  He is currently on Eliquis having failed Coumadin.  He is taking the medication 5 mg twice a daily as prescribed.  He is taking Bumex 4 mg twice daily for leg swelling.  Over the last few days, his legs have swollen dramatically.  He has +2 pitting edema in the right leg borderline +3 edema all the way up to his knee.  He has +2 pitting edema in his left leg as well up to his knee.  The legs are tense and swollen and extremely painful to the touch.  There are small vesicles forming on the anterior shins bilaterally.  There is bruising and erythema surrounding the posterior calves of both legs.  The skin is exquisitely tender to touch.  However his lungs are clear to auscultation bilaterally with no crackles.  He denies any shortness of breath or chest pain.  Swelling appears to be third spacing pitting edema.  At that time, my plan was: Both legs were placed in Unna boots bilaterally.  Reassess the patient on Monday.  If the swelling has not improved I will premedicate the patient with Zaroxolyn 30 minutes prior to taking his morning dose of Bumex to facilitate diuresis.  At the present time his lungs are clear to auscultation bilaterally with no evidence of pulmonary edema.  I believe this is most likely third spacing coupled with his underlying DVT and chronic venous insufficiency rather than true congestive heart failure/fluid overload however if compression therapy does not help the leg swelling, I will increase his diuresis with Zaroxolyn prior to the Bumex. Wt Readings from Last 3 Encounters:  12/07/18 203 lb (92.1 kg)  11/17/18 210 lb (95.3 kg)  11/05/18 210 lb (95.3 kg)      12/07/18 Unna boots were removed today.  As evidenced above, his weight has declined 7 pounds since the last office visit.  I suspect the majority of this is due to diuresis facilitated by the compression Unna boots that were applied to his legs on Friday driving the interstitial fluid in his legs back into the vasculature or his diuretic could effectively cause diuresis.  The swelling in his left leg has resolved.  He has no edema in his left leg distal to his knee.  He continues to have +1 edema in his right leg and although better it is still persistent and likely due to his DVT.  However the pain in his legs has improved dramatically as the swelling has improved.  I spent 30 minutes today with the patient and his wife and I explained that without continue compression therapy, the swelling will quickly return.  However the patient either will not allow or is unable to tolerate compression hose.  Therefore I had the patient put his socks back on today and then wrapped his legs tightly using Ace wraps effectively creating compression hose.  I demonstrated this to his wife and she feels that she can do this to his legs every morning to help control and prevent his edema.  However she is concerned by his inability to eat and take his pills.  1 or 2 days a week, the patient states that he  has a difficult time swallowing his pills.  He reports constant phlegm coming up into his throat and no matter how hard he coughs he is unable to clear his throat from the phlegm.  He states that his pills stick and lodged in his throat coated in this thick mucus in his upper esophagus.  Patient saw GI last fall for these exact same symptoms.  An EGD was performed that did show esophageal erosions and gastritis.  However there were no other abnormalities or obstructions found.  Patient denies aspirating and does not have a cough after he eats however when his pill stick, he avoids eating food because he is afraid is going to  "hang in his throat". Past Medical History:  Diagnosis Date   Atelectasis of right lung    Chronic diastolic (congestive) heart failure ([place])    a. 10/2013 Echo: EF 55-60%, basal-mid anteroseptal and basal-mid inferoseptal HK; b. 02/2017 Echo: EF 60-65%, Gr3 DD.   Chronic fatigue    Chronic subdural hematoma ([place])    a. 12/2015 Head CT: chronic right holo-hemispheric SDH w/o midline shift.   CKD (chronic kidney disease), stage III ([place])    Coronary artery disease    a. 11/2014 NSTEMI: DESx 2 placed to LAD and RCA; b. 08/2015 Cath: LAD patent stent, LCX 65m, RCA patent stent, 57m.   Diabetes mellitus, type II ([place])    a. HgA1c 6.8 in 03/2015   ED (erectile dysfunction)    Gout    Hemangioma of liver 08/02/2015   12 mm enhancing lesion seen on CT - suspicious for benign hemangioma but not diagnostic - MRI recommended   HLD (hyperlipidemia)    Hypertension    Hypertensive heart disease    Ischemic finger ulcer ([place])    a. 03/2017 Seen for digital ulcers - felt to be embolic in setting of PAF. Pt on coumadin.   Kidney stones    Lower extremity edema    a. chronic   OA (osteoarthritis)    Orthostatic hypotension    PAF (paroxysmal atrial fibrillation) ([place])    a. 10/2015 s/p DCCV;  b. CHA2DS2VASc = 6--> on coumadin; c. 10/2015 Echo: sev dil LA.   Prostate cancer ([place])    Pulmonary HTN ([place])    Pulmonary hypertension ([place])    a. 10/2015 Echo: PASP 71mmHg.   Renal insufficiency    Severe mitral regurgitation    a.  10/2015 s/p minimally invasive MV repair; b. 10/2015 Echo: mod MS, mean grad 65mmHg; c. 02/2017 Echo: EF 60-65%, Gr3 DD, mod Ca2+ MV annulus, mod to sev dil LA, mod dil RA, mod TR. PASP 42mmHg.   Spinal stenosis    SVT (supraventricular tachycardia) (HCC)    a. recurrent, usually responsive to vagal manuevers   Tricuspid regurgitation    a. 10/2015 Echo: mild to mod TR.   Past Surgical History:  Procedure Laterality Date   BACK SURGERY     Back surgery  x 2     CARDIAC CATHETERIZATION N/A 08/08/2015   Procedure: Right/Left Heart Cath and Coronary Angiography;  Surgeon: Sherren Mocha, MD;  Location: [facility];  Service: Cardiovascular;  Laterality: N/A;   CARDIAC SURGERY     2 cardiac stents   CARDIOVERSION N/A 10/20/2015   Procedure: CARDIOVERSION;  Surgeon: Pixie Casino, MD;  Location: [facility];  Service: Cardiovascular;  Laterality: N/A;   CERVICAL SPINE SURGERY     CHOLECYSTECTOMY N/A 07/08/2016   Procedure: LAPAROSCOPIC CHOLECYSTECTOMY WITH INTRAOPERATIVE CHOLANGIOGRAM;  Surgeon:  Georganna Skeans, MD;  Location: [facility];  Service: General;  Laterality: N/A;   CORONARY ARTERY BYPASS GRAFT     ESOPHAGOGASTRODUODENOSCOPY (EGD) WITH PROPOFOL N/A 03/19/2018   Procedure: ESOPHAGOGASTRODUODENOSCOPY (EGD) WITH PROPOFOL;  Surgeon: [name], MD;  Location[facility];  Service: Gastroenterology;  Laterality: N/A;   EYE SURGERY     INTRAOPERATIVE TRANSESOPHAGEAL ECHOCARDIOGRAM  10/05/2015   Procedure: INTRAOPERATIVE TRANSESOPHAGEAL ECHOCARDIOGRAM;  Surgeon: Rexene Alberts, MD;  Location: [facility];  Service: Open Heart Surgery;;   JOINT REPLACEMENT     shoulder   LEFT HEART CATHETERIZATION WITH CORONARY ANGIOGRAM N/A 11/16/2014   Procedure: LEFT HEART CATHETERIZATION WITH CORONARY ANGIOGRAM;  Surgeon: Troy Sine, MD;  Location: [facility] C[facility];  Service: Cardiovascular;  Laterality: N/A;   MITRAL VALVE REPAIR Right 10/04/2015   Procedure: MINIMALLY INVASIVE MITRAL VALVE REPAIR (MVR);  Surgeon: Rexene Alberts, MD;  Location: [facility];  Service: Open Heart Surgery;  Laterality: Right;   Rt rotator cuff repair     TEE WITHOUT CARDIOVERSION N/A 06/22/2015   Procedure: TRANSESOPHAGEAL ECHOCARDIOGRAM (TEE);  Surgeon: Skeet Latch, MD;  Location: [facility];  Service: Cardiovascular;  Laterality: N/A;   TEE WITHOUT CARDIOVERSION N/A 10/04/2015   Procedure: TRANSESOPHAGEAL ECHOCARDIOGRAM (TEE);  Surgeon: Rexene Alberts, MD;   Location: [facility];  Service: Open Heart Surgery;  Laterality: N/A;   WOUND EXPLORATION N/A 10/05/2015   Procedure: Sternal Incision;  Surgeon: Rexene Alberts, MD;  Location: [facility];  Service: Open Heart Surgery;  Laterality: N/A;   Current Outpatient Medications on File Prior to Visit  Medication Sig Dispense Refill   allopurinol (ZYLOPRIM) 100 MG tablet TAKE ONE (1) TABLET EACH DAY 90 tablet 2   apixaban (ELIQUIS) 5 MG TABS tablet Take 1 tablet (5 mg total) by mouth 2 (two) times daily. 60 tablet 5   bumetanide (BUMEX) 2 MG tablet Take 2 tablets (4 mg total) by mouth 2 (two) times daily. 180 tablet 0   cyclobenzaprine (FLEXERIL) 5 MG tablet Take 1 tablet (5 mg total) by mouth 3 (three) times daily as needed for muscle spasms. 30 tablet 0   docusate sodium (COLACE) 100 MG capsule Take 1 capsule (100 mg total) by mouth daily as needed for moderate constipation. 60 capsule 2   finasteride (PROSCAR) 5 MG tablet Take 1 tablet (5 mg total) by mouth at bedtime. 90 tablet 2   levofloxacin (LEVAQUIN) 500 MG tablet Take 1 tablet (500 mg total) by mouth daily. 7 tablet 0   loratadine (CLARITIN) 10 MG tablet Take 1 tablet (10 mg total) by mouth daily as needed for allergies. 30 tablet 3   metoprolol tartrate (LOPRESSOR) 25 MG tablet Take 0.5 tablets (12.5 mg total) by mouth 2 (two) times daily. 90 tablet 3   OVER THE COUNTER MEDICATION Neuroquell: Take 1 capsule by mouth once a day for immune health     pravastatin (PRAVACHOL) 40 MG tablet TAKE 1 TABLET BY MOUTH EVERY DAY AT 6PM 30 tablet 9   spironolactone (ALDACTONE) 25 MG tablet Take 1 tablet (25 mg total) by mouth daily. 90 tablet 3   traMADol (ULTRAM) 50 MG tablet TAKE ONE TABLET BY MOUTH EVERY 6 HOURS AS NEEDED FOR MODERATE PAIN 30 tablet 0   triamcinolone cream (KENALOG) 0.1 % Apply 1 application topically 2 (two) times daily. 30 g 0   benzonatate (TESSALON) 200 MG capsule Take 1 capsule (200 mg total) by mouth 2 (two) times daily as  needed for cough. (Patient not taking:  Reported on 12/07/2018) 30 capsule 2   No current facility-administered medications on file prior to visit.    No Known Allergies Social History   Socioeconomic History   Marital status: Married    Spouse name: Not on file   Number of children: 4   Years of education: Not on file   Highest education level: Not on file  Occupational History    Employer: RETIRED  Social Needs   Financial resource strain: Not on file   Food insecurity:    Worry: Not on file    Inability: Not on file   Transportation needs:    Medical: Not on file    Non-medical: Not on file  Tobacco Use   Smoking status: Former Smoker    Types: Cigarettes, Pipe, Cigars   Smokeless tobacco: Former Systems developer    Types: Chew   Tobacco comment: [place] YEARS AGO"  Substance and Sexual Activity   Alcohol use: No   Drug use: No   Sexual activity: Not on file  Lifestyle   Physical activity:    Days per week: Not on file    Minutes per session: Not on file   Stress: Not on file  Relationships   Social connections:    Talks on phone: Not on file    Gets together: Not on file    Attends religious service: Not on file    Active member of club or organization: Not on file    Attends meetings of clubs or organizations: Not on file    Relationship status: Not on file   Intimate partner violence:    Fear of current or ex partner: Not on file    Emotionally abused: Not on file    Physically abused: Not on file    Forced sexual activity: Not on file  Other Topics Concern   Not on file  Social History Narrative   ** Merged History Encounter **       Four adopted children.  Lives alone.       Review of Systems  All other systems reviewed and are negative.      Objective:   Physical Exam Vitals signs reviewed.  Constitutional:      Appearance: Normal appearance.  Cardiovascular:     Rate and Rhythm: Normal rate. Rhythm irregular.     Heart  sounds: Murmur present.  Pulmonary:     Effort: No respiratory distress.     Breath sounds: No wheezing, rhonchi or rales.  Chest:     Chest wall: No tenderness.  Abdominal:     General: Abdomen is flat. Bowel sounds are normal.     Palpations: Abdomen is soft.  Musculoskeletal:        General: Tenderness present.     Right lower leg: Edema present.     Left lower leg: Edema present.  Neurological:     Mental Status: He is alert.    The edema in the left leg has essentially resolved.  The edema in the right leg is persistent and +1 but dramatically improved from office visit on Friday.  Patient has diuresed almost 7 pounds.       Assessment & Plan:  Chronic diastolic CHF (congestive heart failure) (HCC)  Leg swelling  Esophageal dysphagia  Patient swelling has improved dramatically after wearing the Unna boot simply for 3 days.  However I know in my heart that the patient nor his wife is going to be able to wear compression hose  tomorrow.  Therefore I will modify compressive stockings for the patient.  I have recommended that he apply his usual socks similar to how I put them on today in clinic.  I would then recommend that his wife to wrap his legs tightly from foot to the knee with a 6 inch Ace wrap similar to how I did today.  Effectively this would be compression hose that she could apply to his legs and not have a difficult time pulling them on and off.  They may not provide ideal compression however is better than loose socks and hopefully this will help prevent some of the pitting edema and third spacing in his legs is causing his significant leg pain.  Regarding his esophageal dysphasia, we will start the patient on Protonix 40 mg a day and see if this helps his symptoms over the next 2 weeks.  Perhaps silent reflux from his stomach is irritating the lining of his esophagus causing swelling leading to the dysphasia as discussed on his EGD report.  If not improved, we could  consider adding glycopyrrolate to prevent or decrease the mucus in his esophagus however I am concerned about sedation dizziness and anticholinergic side effects.

## 2018-12-08 ENCOUNTER — Ambulatory Visit: Payer: Medicare Other | Admitting: Family Medicine

## 2018-12-14 ENCOUNTER — Other Ambulatory Visit: Payer: Self-pay | Admitting: Family Medicine

## 2019-01-08 ENCOUNTER — Telehealth: Payer: Self-pay | Admitting: Family Medicine
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
## 2019-01-19 ENCOUNTER — Other Ambulatory Visit: Payer: Self-pay

## 2019-01-19 ENCOUNTER — Encounter: Payer: Self-pay | Admitting: Family Medicine

## 2019-01-19 ENCOUNTER — Ambulatory Visit (INDEPENDENT_AMBULATORY_CARE_PROVIDER_SITE_OTHER): Payer: Medicare Other | Admitting: Family Medicine

## 2019-01-19 VITALS — BP 80/50 | HR 100 | Temp 97.8°F | Resp 16 | Ht 67.0 in | Wt 202.0 lb

## 2019-01-19 DIAGNOSIS — I5032 Chronic diastolic (congestive) heart failure: Secondary | ICD-10-CM

## 2019-01-19 DIAGNOSIS — M7989 Other specified soft tissue disorders: Secondary | ICD-10-CM

## 2019-01-19 MED ORDER — DIGOXIN 125 MCG PO TABS: 125.0000 ug | ORAL_TABLET | Freq: Every day | ORAL | 3 refills | Status: AC

## 2019-01-19 NOTE — Progress Notes (Signed)
Subjective:    Patient ID: [name], male    DOB: 1928/11/14, 85 y.o.   MRN: [id number]  HPI  12/04/18 Please see the patient's recent history.  Patient has a history of chronic leg swelling secondary to chronic venous insufficiency, chronic congestive heart failure, and recently has been diagnosed with a right-sided DVT.  He is currently on Eliquis having failed Coumadin.  He is taking the medication 5 mg twice a daily as prescribed.  He is taking Bumex 4 mg twice daily for leg swelling.  Over the last few days, his legs have swollen dramatically.  He has +2 pitting edema in the right leg borderline +3 edema all the way up to his knee.  He has +2 pitting edema in his left leg as well up to his knee.  The legs are tense and swollen and extremely painful to the touch.  There are small vesicles forming on the anterior shins bilaterally.  There is bruising and erythema surrounding the posterior calves of both legs.  The skin is exquisitely tender to touch.  However his lungs are clear to auscultation bilaterally with no crackles.  He denies any shortness of breath or chest pain.  Swelling appears to be third spacing pitting edema.  At that time, my plan was: Both legs were placed in Unna boots bilaterally.  Reassess the patient on Monday.  If the swelling has not improved I will premedicate the patient with Zaroxolyn 30 minutes prior to taking his morning dose of Bumex to facilitate diuresis.  At the present time his lungs are clear to auscultation bilaterally with no evidence of pulmonary edema.  I believe this is most likely third spacing coupled with his underlying DVT and chronic venous insufficiency rather than true congestive heart failure/fluid overload however if compression therapy does not help the leg swelling, I will increase his diuresis with Zaroxolyn prior to the Bumex. Wt Readings from Last 3 Encounters:  12/07/18 203 lb (92.1 kg)  11/17/18 210 lb (95.3 kg)  11/05/18 210 lb (95.3 kg)      12/07/18 Unna boots were removed today.  As evidenced above, his weight has declined 7 pounds since the last office visit.  I suspect the majority of this is due to diuresis facilitated by the compression Unna boots that were applied to his legs on Friday driving the interstitial fluid in his legs back into the vasculature or his diuretic could effectively cause diuresis.  The swelling in his left leg has resolved.  He has no edema in his left leg distal to his knee.  He continues to have +1 edema in his right leg and although better it is still persistent and likely due to his DVT.  However the pain in his legs has improved dramatically as the swelling has improved.  I spent 30 minutes today with the patient and his wife and I explained that without continue compression therapy, the swelling will quickly return.  However the patient either will not allow or is unable to tolerate compression hose.  Therefore I had the patient put his socks back on today and then wrapped his legs tightly using Ace wraps effectively creating compression hose.  I demonstrated this to his wife and she feels that she can do this to his legs every morning to help control and prevent his edema.  However she is concerned by his inability to eat and take his pills.  1 or 2 days a week, the patient states that he  has a difficult time swallowing his pills.  He reports constant phlegm coming up into his throat and no matter how hard he coughs he is unable to clear his throat from the phlegm.  He states that his pills stick and lodged in his throat coated in this thick mucus in his upper esophagus.  Patient saw GI last fall for these exact same symptoms.  An EGD was performed that did show esophageal erosions and gastritis.  However there were no other abnormalities or obstructions found.  Patient denies aspirating and does not have a cough after he eats however when his pill stick, he avoids eating food because he is afraid is going to  "hang in his throat".  At that time, my plan was: Patient swelling has improved dramatically after wearing the Unna boot simply for 3 days.  However I know in my heart that the patient nor his wife is going to be able to wear compression hose tomorrow.  Therefore I will modify compressive stockings for the patient.  I have recommended that he apply his usual socks similar to how I put them on today in clinic.  I would then recommend that his wife to wrap his legs tightly from foot to the knee with a 6 inch Ace wrap similar to how I did today.  Effectively this would be compression hose that she could apply to his legs and not have a difficult time pulling them on and off.  They may not provide ideal compression however is better than loose socks and hopefully this will help prevent some of the pitting edema and third spacing in his legs is causing his significant leg pain.  Regarding his esophageal dysphasia, we will start the patient on Protonix 40 mg a day and see if this helps his symptoms over the next 2 weeks.  Perhaps silent reflux from his stomach is irritating the lining of his esophagus causing swelling leading to the dysphasia as discussed on his EGD report.  If not improved, we could consider adding glycopyrrolate to prevent or decrease the mucus in his esophagus however I am concerned about sedation dizziness and anticholinergic side effects.   01/19/19 As expected, the swelling has returned.  He is here today with his wife and his daughter.  Patient apparently became so weak recently that he went to stay with his daughter for 1 week.  She is adamant that after staying with her for 1 week he was able to walk and that the swelling was better in his legs.  However ever since returning back home to his wife, he has become progressively weaker.  The swelling has returned in his legs.  He is now barely able to stand or walk.  He has +2 pitting edema in his right leg up to his knee and +1 pitting edema in  his left leg up to his knee.  His legs are so heavy that he has a difficult time even lifting them to walk.  He also reports shortness of breath with minimal activity.  He denies any cough.  He denies any chest pain.  He denies any orthopnea.  However he is in tachycardia today with atrial fibrillation.  Heart rate is between 101 110 bpm on his physical exam.  I had recently discontinue metoprolol due to hypotension and is shown today in his blood pressure at 80/50, his blood pressure is extremely low today.  I confirmed his blood pressure to be 86/50 myself.  Patient is taking Bumex  twice daily and spironolactone at night however this is not controlling his edema.  He is also becoming extremely deconditioned and weak.  He is falling at home trying to get out of bed. Past Medical History:  Diagnosis Date   Atelectasis of right lung    Chronic diastolic (congestive) heart failure ([place])    a. 10/2013 Echo: EF 55-60%, basal-mid anteroseptal and basal-mid inferoseptal HK; b. 02/2017 Echo: EF 60-65%, Gr3 DD.   Chronic fatigue    Chronic subdural hematoma ([place])    a. 12/2015 Head CT: chronic right holo-hemispheric SDH w/o midline shift.   CKD (chronic kidney disease), stage III ([place])    Coronary artery disease    a. 11/2014 NSTEMI: DESx 2 placed to LAD and RCA; b. 08/2015 Cath: LAD patent stent, LCX 110m, RCA patent stent, 44m.   Diabetes mellitus, type II ([place])    a. HgA1c 6.8 in 03/2015   ED (erectile dysfunction)    Gout    Hemangioma of liver 08/02/2015   12 mm enhancing lesion seen on CT - suspicious for benign hemangioma but not diagnostic - MRI recommended   HLD (hyperlipidemia)    Hypertension    Hypertensive heart disease    Ischemic finger ulcer ([place])    a. 03/2017 Seen for digital ulcers - felt to be embolic in setting of PAF. Pt on coumadin.   Kidney stones    Lower extremity edema    a. chronic   OA (osteoarthritis)    Orthostatic hypotension    PAF (paroxysmal atrial  fibrillation) ([place])    a. 10/2015 s/p DCCV;  b. CHA2DS2VASc = 6--> on coumadin; c. 10/2015 Echo: sev dil LA.   Prostate cancer ([place])    Pulmonary HTN ([place])    Pulmonary hypertension ([place])    a. 10/2015 Echo: PASP 73mmHg.   Renal insufficiency    Severe mitral regurgitation    a.  10/2015 s/p minimally invasive MV repair; b. 10/2015 Echo: mod MS, mean grad 50mmHg; c. 02/2017 Echo: EF 60-65%, Gr3 DD, mod Ca2+ MV annulus, mod to sev dil LA, mod dil RA, mod TR. PASP 13mmHg.   Spinal stenosis    SVT (supraventricular tachycardia) (H[facility])    a. recurrent, usually responsive to vagal manuevers   Tricuspid regurgitation    a. 10/2015 Echo: mild to mod TR.   Past Surgical History:  Procedure Laterality Date   BACK SURGERY     Back surgery x 2     CARDIAC CATHETERIZATION N/A 08/08/2015   Procedure: Right/Left Heart Cath and Coronary Angiography;  Surgeon: Sherren Mocha, MD;  Location: [facility];  Service: Cardiovascular;  Laterality: N/A;   CARDIAC SURGERY     2 cardiac stents   CARDIOVERSION N/A 10/20/2015   Procedure: CARDIOVERSION;  Surgeon: Pixie Casino, MD;  Location: [facility];  Service: Cardiovascular;  Laterality: N/A;   CERVICAL SPINE SURGERY     CHOLECYSTECTOMY N/A 07/08/2016   Procedure: LAPAROSCOPIC CHOLECYSTECTOMY WITH INTRAOPERATIVE CHOLANGIOGRAM;  Surgeon: Georganna Skeans, MD;  Location: [facility];  Service: General;  Laterality: N/A;   CORONARY ARTERY BYPASS GRAFT     ESOPHAGOGASTRODUODENOSCOPY (EGD) WITH PROPOFOL N/A 03/19/2018   Procedure: ESOPHAGOGASTRODUODENOSCOPY (EGD) WITH PROPOFOL;  Surgeon: [name], MD;  Location: [facility] ENDOSCOPY;  Service: Gastroenterology;  Laterality: N/A;   EYE SURGERY     INTRAOPERATIVE TRANSESOPHAGEAL ECHOCARDIOGRAM  10/05/2015   Procedure: INTRAOPERATIVE TRANSESOPHAGEAL ECHOCARDIOGRAM;  Surgeon: Rexene Alberts, MD;  Location: [facility];  Service: Open Heart Surgery;;   JOINT  REPLACEMENT     shoulder   LEFT HEART CATHETERIZATION  WITH CORONARY ANGIOGRAM N/A 11/16/2014   Procedure: LEFT HEART CATHETERIZATION WITH CORONARY ANGIOGRAM;  Surgeon: Troy Sine, MD;  Location: [facility] CATH LAB;  Service: Cardiovascular;  Laterality: N/A;   MITRAL VALVE REPAIR Right 10/04/2015   Procedure: MINIMALLY INVASIVE MITRAL VALVE REPAIR (MVR);  Surgeon: Rexene Alberts, MD;  Location: [place];  Service: Open Heart Surgery;  Laterality: Right;   Rt rotator cuff repair     TEE WITHOUT CARDIOVERSION N/A 06/22/2015   Procedure: TRANSESOPHAGEAL ECHOCARDIOGRAM (TEE);  Surgeon: Skeet Latch, MD;  Location: [place];  Service: Cardiovascular;  Laterality: N/A;   TEE WITHOUT CARDIOVERSION N/A 10/04/2015   Procedure: TRANSESOPHAGEAL ECHOCARDIOGRAM (TEE);  Surgeon: Rexene Alberts, MD;  Location: [facility] [place];  Service: Open Heart Surgery;  Laterality: N/A;   WOUND EXPLORATION N/A 10/05/2015   Procedure: Sternal Incision;  Surgeon: Rexene Alberts, MD;  Location: [place];  Service: Open Heart Surgery;  Laterality: N/A;   Current Outpatient Medications on File Prior to Visit  Medication Sig Dispense Refill   apixaban (ELIQUIS) 5 MG TABS tablet Take 1 tablet (5 mg total) by mouth 2 (two) times daily. 60 tablet 5   bumetanide (BUMEX) 2 MG tablet Take 2 tablets (4 mg total) by mouth 2 (two) times daily. 180 tablet 0   finasteride (PROSCAR) 5 MG tablet Take 1 tablet (5 mg total) by mouth at bedtime. 90 tablet 2   OVER THE COUNTER MEDICATION Neuroquell: Take 1 capsule by mouth once a day for immune health     pantoprazole (PROTONIX) 40 MG tablet Take 1 tablet (40 mg total) by mouth daily. 30 tablet 3   pravastatin (PRAVACHOL) 40 MG tablet TAKE 1 TABLET BY MOUTH EVERY DAY AT 6PM 30 tablet 9   spironolactone (ALDACTONE) 25 MG tablet Take 1 tablet (25 mg total) by mouth daily. 90 tablet 3   traMADol (ULTRAM) 50 MG tablet TAKE ONE TABLET BY MOUTH EVERY 6 HOURS AS NEEDED FOR MODERATE PAIN 30 tablet 0   No current facility-administered medications on file  prior to visit.    No Known Allergies Social History   Socioeconomic History   Marital status: Married    Spouse name: Not on file   Number of children: 4   Years of education: Not on file   Highest education level: Not on file  Occupational History    Employer: RETIRED  Social Needs   Financial resource strain: Not on file   Food insecurity    Worry: Not on file    Inability: Not on file   Transportation needs    Medical: Not on file    Non-medical: Not on file  Tobacco Use   Smoking status: Former Smoker    Types: Cigarettes, Pipe, Cigars   Smokeless tobacco: Former Systems developer    Types: Chew   Tobacco comment: [place] YEARS AGO"  Substance and Sexual Activity   Alcohol use: No   Drug use: No   Sexual activity: Not on file  Lifestyle   Physical activity    Days per week: Not on file    Minutes per session: Not on file   Stress: Not on file  Relationships   Social connections    Talks on phone: Not on file    Gets together: Not on file    Attends religious service: Not on file    Active member of club or organization: Not on file  Attends meetings of clubs or organizations: Not on file    Relationship status: Not on file   Intimate partner violence    Fear of current or ex partner: Not on file    Emotionally abused: Not on file    Physically abused: Not on file    Forced sexual activity: Not on file  Other Topics Concern   Not on file  Social History Narrative   ** Merged History Encounter **       Four adopted children.  Lives alone.       Review of Systems  All other systems reviewed and are negative.      Objective:   Physical Exam Vitals signs reviewed.  Constitutional:      Appearance: Normal appearance.  Cardiovascular:     Rate and Rhythm: Tachycardia present. Rhythm irregular.     Heart sounds: Murmur present.  Pulmonary:     Effort: No respiratory distress.     Breath sounds: No wheezing, rhonchi or rales.    Chest:     Chest wall: No tenderness.  Abdominal:     General: Abdomen is flat. Bowel sounds are normal.     Palpations: Abdomen is soft.  Musculoskeletal:        General: Tenderness present.     Right lower leg: Edema present.     Left lower leg: Edema present.  Neurological:     Mental Status: He is alert.       Assessment & Plan:  1. Chronic diastolic CHF (congestive heart failure) (HCC)  2. Leg swelling  Patient has acute on chronic diastolic heart failure.  He is fluid overloaded on exam: Wt Readings from Last 3 Encounters:  01/19/19 202 lb (91.6 kg)  12/07/18 203 lb (92.1 kg)  11/17/18 210 lb (95.3 kg)   However despite showing obvious signs of peripheral edema, his weight is essentially unchanged from his last visit.  I believe this is because he is not eating well and losing weight due to his dysphasia.  He is also becoming progressively more deconditioned.  He is falling more frequently at home.  He has significant fatigue and becomes easily winded doing minimal activity.  His blood pressure is precariously low yet he is in tachycardia with atrial fibrillation.  M unable to diurese him effectively due to his hypotension.  Therefore I recommended trying digoxin 0.125 mg daily to control his heart rate without dropping his blood pressure.  I have placed the patient in Unna boots bilaterally to help with the peripheral edema by driving the pitting edema back into the intravascular space where the Bumex and spironolactone can effectively diurese him.  Recheck the patient on Friday and replace Unna boots and monitor his heart rate and blood pressure.  If worsening, I would recommend hospice consultation due to progressive decline secondary to age and chronic diastolic heart failure.  I discussed this with the patient's wife as well as the patient's daughter.  I discussed that with them I would like them to breach the subject with the patient so that he can be prepared when I discussed  the situation with him on Friday so as not to blindside him.

## 2019-01-22 ENCOUNTER — Encounter: Payer: Self-pay | Admitting: Family Medicine

## 2019-01-22 ENCOUNTER — Ambulatory Visit (INDEPENDENT_AMBULATORY_CARE_PROVIDER_SITE_OTHER): Payer: Medicare Other | Admitting: Family Medicine

## 2019-01-22 ENCOUNTER — Other Ambulatory Visit: Payer: Self-pay

## 2019-01-22 VITALS — BP 88/60 | HR 87 | Temp 97.3°F | Resp 16 | Ht 67.0 in

## 2019-01-22 DIAGNOSIS — I5023 Acute on chronic systolic (congestive) heart failure: Secondary | ICD-10-CM | POA: Diagnosis not present

## 2019-01-22 DIAGNOSIS — R29898 Other symptoms and signs involving the musculoskeletal system: Secondary | ICD-10-CM

## 2019-01-22 DIAGNOSIS — I5032 Chronic diastolic (congestive) heart failure: Secondary | ICD-10-CM

## 2019-01-22 DIAGNOSIS — I872 Venous insufficiency (chronic) (peripheral): Secondary | ICD-10-CM

## 2019-01-22 DIAGNOSIS — I4819 Other persistent atrial fibrillation: Secondary | ICD-10-CM | POA: Diagnosis not present

## 2019-01-22 DIAGNOSIS — Z7901 Long term (current) use of anticoagulants: Secondary | ICD-10-CM | POA: Diagnosis not present

## 2019-01-22 DIAGNOSIS — R296 Repeated falls: Secondary | ICD-10-CM

## 2019-01-22 NOTE — Addendum Note (Signed)
Addended by: Shary Decamp B on: 01/22/2019 02:37 PM   Modules accepted: Orders

## 2019-01-22 NOTE — Progress Notes (Signed)
Subjective:    Patient ID: [name], male    DOB: 1928-10-29, 85 y.o.   MRN: [id number]  HPI  12/04/18 Please see the patient's recent history.  Patient has a history of chronic leg swelling secondary to chronic venous insufficiency, chronic congestive heart failure, and recently has been diagnosed with a right-sided DVT.  He is currently on Eliquis having failed Coumadin.  He is taking the medication 5 mg twice a daily as prescribed.  He is taking Bumex 4 mg twice daily for leg swelling.  Over the last few days, his legs have swollen dramatically.  He has +2 pitting edema in the right leg borderline +3 edema all the way up to his knee.  He has +2 pitting edema in his left leg as well up to his knee.  The legs are tense and swollen and extremely painful to the touch.  There are small vesicles forming on the anterior shins bilaterally.  There is bruising and erythema surrounding the posterior calves of both legs.  The skin is exquisitely tender to touch.  However his lungs are clear to auscultation bilaterally with no crackles.  He denies any shortness of breath or chest pain.  Swelling appears to be third spacing pitting edema.  At that time, my plan was: Both legs were placed in Unna boots bilaterally.  Reassess the patient on Monday.  If the swelling has not improved I will premedicate the patient with Zaroxolyn 30 minutes prior to taking his morning dose of Bumex to facilitate diuresis.  At the present time his lungs are clear to auscultation bilaterally with no evidence of pulmonary edema.  I believe this is most likely third spacing coupled with his underlying DVT and chronic venous insufficiency rather than true congestive heart failure/fluid overload however if compression therapy does not help the leg swelling, I will increase his diuresis with Zaroxolyn prior to the Bumex. Wt Readings from Last 3 Encounters:  12/07/18 203 lb (92.1 kg)  11/17/18 210 lb (95.3 kg)  11/05/18 210 lb (95.3 kg)     12/07/18 Unna boots were removed today.  As evidenced above, his weight has declined 7 pounds since the last office visit.  I suspect the majority of this is due to diuresis facilitated by the compression Unna boots that were applied to his legs on Friday driving the interstitial fluid in his legs back into the vasculature or his diuretic could effectively cause diuresis.  The swelling in his left leg has resolved.  He has no edema in his left leg distal to his knee.  He continues to have +1 edema in his right leg and although better it is still persistent and likely due to his DVT.  However the pain in his legs has improved dramatically as the swelling has improved.  I spent 30 minutes today with the patient and his wife and I explained that without continue compression therapy, the swelling will quickly return.  However the patient either will not allow or is unable to tolerate compression hose.  Therefore I had the patient put his socks back on today and then wrapped his legs tightly using Ace wraps effectively creating compression hose.  I demonstrated this to his wife and she feels that she can do this to his legs every morning to help control and prevent his edema.  However she is concerned by his inability to eat and take his pills.  1 or 2 days a week, the patient states that he has  a difficult time swallowing his pills.  He reports constant phlegm coming up into his throat and no matter how hard he coughs he is unable to clear his throat from the phlegm.  He states that his pills stick and lodged in his throat coated in this thick mucus in his upper esophagus.  Patient saw GI last fall for these exact same symptoms.  An EGD was performed that did show esophageal erosions and gastritis.  However there were no other abnormalities or obstructions found.  Patient denies aspirating and does not have a cough after he eats however when his pill stick, he avoids eating food because he is afraid is going to  "hang in his throat".  At that time, my plan was: Patient swelling has improved dramatically after wearing the Unna boot simply for 3 days.  However I know in my heart that the patient nor his wife is going to be able to wear compression hose tomorrow.  Therefore I will modify compressive stockings for the patient.  I have recommended that he apply his usual socks similar to how I put them on today in clinic.  I would then recommend that his wife to wrap his legs tightly from foot to the knee with a 6 inch Ace wrap similar to how I did today.  Effectively this would be compression hose that she could apply to his legs and not have a difficult time pulling them on and off.  They may not provide ideal compression however is better than loose socks and hopefully this will help prevent some of the pitting edema and third spacing in his legs is causing his significant leg pain.  Regarding his esophageal dysphasia, we will start the patient on Protonix 40 mg a day and see if this helps his symptoms over the next 2 weeks.  Perhaps silent reflux from his stomach is irritating the lining of his esophagus causing swelling leading to the dysphasia as discussed on his EGD report.  If not improved, we could consider adding glycopyrrolate to prevent or decrease the mucus in his esophagus however I am concerned about sedation dizziness and anticholinergic side effects.   01/19/19 As expected, the swelling has returned.  He is here today with his wife and his daughter.  Patient apparently became so weak recently that he went to stay with his daughter for 1 week.  She is adamant that after staying with her for 1 week he was able to walk and that the swelling was better in his legs.  However ever since returning back home to his wife, he has become progressively weaker.  The swelling has returned in his legs.  He is now barely able to stand or walk.  He has +2 pitting edema in his right leg up to his knee and +1 pitting edema in  his left leg up to his knee.  His legs are so heavy that he has a difficult time even lifting them to walk.  He also reports shortness of breath with minimal activity.  He denies any cough.  He denies any chest pain.  He denies any orthopnea.  However he is in tachycardia today with atrial fibrillation.  Heart rate is between 101 110 bpm on his physical exam.  I had recently discontinue metoprolol due to hypotension and is shown today in his blood pressure at 80/50, his blood pressure is extremely low today.  I confirmed his blood pressure to be 86/50 myself.  Patient is taking Bumex twice  daily and spironolactone at night however this is not controlling his edema.  He is also becoming extremely deconditioned and weak.  He is falling at home trying to get out of bed.  At that time, my plan was: Patient has acute on chronic diastolic heart failure.  He is fluid overloaded on exam.  However despite showing obvious signs of peripheral edema, his weight is essentially unchanged from his last visit.  I believe this is because he is not eating well and losing weight due to his dysphasia.  He is also becoming progressively more deconditioned.  He is falling more frequently at home.  He has significant fatigue and becomes easily winded doing minimal activity.  His blood pressure is precariously low yet he is in tachycardia with atrial fibrillation.  I am unable to diurese him effectively due to his hypotension.  Therefore I recommended trying digoxin 0.125 mg daily to control his heart rate without dropping his blood pressure.  I have placed the patient in Unna boots bilaterally to help with the peripheral edema by driving the pitting edema back into the intravascular space where the Bumex and spironolactone can effectively diurese him.  Recheck the patient on Friday and replace Unna boots and monitor his heart rate and blood pressure.  If worsening, I would recommend hospice consultation due to progressive decline  secondary to age and chronic diastolic heart failure.  I discussed this with the patient's wife as well as the patient's daughter.  I discussed that with them I would like them to breach the subject with the patient so that he can be prepared when I discussed the situation with him on Friday so as not to blindside him.  01/22/19 Heart rate has improved.  His heart rate is in the 70s today on exam although irregular.  However his blood pressure remains extremely low at 88/60.  As long as he is in a chair he feels fine however when he stands he does feel lightheaded.  He denies feeling like he is going to pass out.  He denies any chest pain.  He denies any shortness of breath however he does have dyspnea and fatigue with any activity.  The swelling in his left leg has improved dramatically after wearing the [employer].  The edema in the right leg has improved from +2 to +1 although still present up to the knee.  I am unable to effectively diurese the patient due to hypotension.  He continues to take Bumex twice a day and spironolactone once a day Past Medical History:  Diagnosis Date   Atelectasis of right lung    Chronic diastolic (congestive) heart failure ([place])    a. 10/2013 Echo: EF 55-60%, basal-mid anteroseptal and basal-mid inferoseptal HK; b. 02/2017 Echo: EF 60-65%, Gr3 DD.   Chronic fatigue    Chronic subdural hematoma ([place])    a. 12/2015 Head CT: chronic right holo-hemispheric SDH w/o midline shift.   CKD (chronic kidney disease), stage III ([place])    Coronary artery disease    a. 11/2014 NSTEMI: DESx 2 placed to LAD and RCA; b. 08/2015 Cath: LAD patent stent, LCX 53m, RCA patent stent, 53m.   Diabetes mellitus, type II ([place])    a. HgA1c 6.8 in 03/2015   ED (erectile dysfunction)    Gout    Hemangioma of liver 08/02/2015   12 mm enhancing lesion seen on CT - suspicious for benign hemangioma but not diagnostic - MRI recommended   HLD (hyperlipidemia)  Hypertension    Hypertensive  heart disease    Ischemic finger ulcer ([place])    a. 03/2017 Seen for digital ulcers - felt to be embolic in setting of PAF. Pt on coumadin.   Kidney stones    Lower extremity edema    a. chronic   OA (osteoarthritis)    Orthostatic hypotension    PAF (paroxysmal atrial fibrillation) ([place])    a. 10/2015 s/p DCCV;  b. CHA2DS2VASc = 6--> on coumadin; c. 10/2015 Echo: sev dil LA.   Prostate cancer ([place])    Pulmonary HTN ([place])    Pulmonary hypertension ([place])    a. 10/2015 Echo: PASP 46mmHg.   Renal insufficiency    Severe mitral regurgitation    a.  10/2015 s/p minimally invasive MV repair; b. 10/2015 Echo: mod MS, mean grad 43mmHg; c. 02/2017 Echo: EF 60-65%, Gr3 DD, mod Ca2+ MV annulus, mod to sev dil LA, mod dil RA, mod TR. PASP 48mmHg.   Spinal stenosis    SVT (supraventricular tachycardia) ([facility])    a. recurrent, usually responsive to vagal manuevers   Tricuspid regurgitation    a. 10/2015 Echo: mild to mod TR.   Past Surgical History:  Procedure Laterality Date   BACK SURGERY     Back surgery x 2     CARDIAC CATHETERIZATION N/A 08/08/2015   Procedure: Right/Left Heart Cath and Coronary Angiography;  Surgeon: Sherren Mocha, MD;  Location: [place] [facility];  Service: Cardiovascular;  Laterality: N/A;   CARDIAC SURGERY     2 cardiac stents   CARDIOVERSION N/A 10/20/2015   Procedure: CARDIOVERSION;  Surgeon: Pixie Casino, MD;  Location: [facility];  Service: Cardiovascular;  Laterality: N/A;   CERVICAL SPINE SURGERY     CHOLECYSTECTOMY N/A 07/08/2016   Procedure: LAPAROSCOPIC CHOLECYSTECTOMY WITH INTRAOPERATIVE CHOLANGIOGRAM;  Surgeon: Georganna Skeans, MD;  Location: [facility];  Service: General;  Laterality: N/A;   CORONARY ARTERY BYPASS GRAFT     ESOPHAGOGASTRODUODENOSCOPY (EGD) WITH PROPOFOL N/A 03/19/2018   Procedure: ESOPHAGOGASTRODUODENOSCOPY (EGD) WITH PROPOFOL;  Surgeon: [name], MD;  Location: [facility] ENDOSCOPY;  Service: Gastroenterology;  Laterality: N/A;    EYE SURGERY     INTRAOPERATIVE TRANSESOPHAGEAL ECHOCARDIOGRAM  10/05/2015   Procedure: INTRAOPERATIVE TRANSESOPHAGEAL ECHOCARDIOGRAM;  Surgeon: Rexene Alberts, MD;  Location: [facility];  Service: Open Heart Surgery;;   JOINT REPLACEMENT     shoulder   LEFT HEART CATHETERIZATION WITH CORONARY ANGIOGRAM N/A 11/16/2014   Procedure: LEFT HEART CATHETERIZATION WITH CORONARY ANGIOGRAM;  Surgeon: Troy Sine, MD;  Location: [facility] CATH LAB;  Service: Cardiovascular;  Laterality: N/A;   MITRAL VALVE REPAIR Right 10/04/2015   Procedure: MINIMALLY INVASIVE MITRAL VALVE REPAIR (MVR);  Surgeon: Rexene Alberts, MD;  Location: [facility];  Service: Open Heart Surgery;  Laterality: Right;   Rt rotator cuff repair     TEE WITHOUT CARDIOVERSION N/A 06/22/2015   Procedure: TRANSESOPHAGEAL ECHOCARDIOGRAM (TEE);  Surgeon: Skeet Latch, MD;  Location: [facility];  Service: Cardiovascular;  Laterality: N/A;   TEE WITHOUT CARDIOVERSION N/A 10/04/2015   Procedure: TRANSESOPHAGEAL ECHOCARDIOGRAM (TEE);  Surgeon: Rexene Alberts, MD;  Location: [facility];  Service: Open Heart Surgery;  Laterality: N/A;   WOUND EXPLORATION N/A 10/05/2015   Procedure: Sternal Incision;  Surgeon: Rexene Alberts, MD;  Location: [facility];  Service: Open Heart Surgery;  Laterality: N/A;   Current Outpatient Medications on File Prior to Visit  Medication Sig Dispense Refill   apixaban (ELIQUIS) 5 MG TABS tablet Take 1 tablet (  5 mg total) by mouth 2 (two) times daily. 60 tablet 5   bumetanide (BUMEX) 2 MG tablet Take 2 tablets (4 mg total) by mouth 2 (two) times daily. 180 tablet 0   digoxin (LANOXIN) 0.125 MG tablet Take 1 tablet (125 mcg total) by mouth daily. 30 tablet 3   finasteride (PROSCAR) 5 MG tablet Take 1 tablet (5 mg total) by mouth at bedtime. 90 tablet 2   OVER THE COUNTER MEDICATION Neuroquell: Take 1 capsule by mouth once a day for immune health     pantoprazole (PROTONIX) 40 MG tablet Take 1 tablet (40 mg total) by mouth daily.  30 tablet 3   pravastatin (PRAVACHOL) 40 MG tablet TAKE 1 TABLET BY MOUTH EVERY DAY AT 6PM 30 tablet 9   spironolactone (ALDACTONE) 25 MG tablet Take 1 tablet (25 mg total) by mouth daily. 90 tablet 3   traMADol (ULTRAM) 50 MG tablet TAKE ONE TABLET BY MOUTH EVERY 6 HOURS AS NEEDED FOR MODERATE PAIN 30 tablet 0   No current facility-administered medications on file prior to visit.    No Known Allergies Social History   Socioeconomic History   Marital status: Married    Spouse name: Not on file   Number of children: 4   Years of education: Not on file   Highest education level: Not on file  Occupational History    Employer: RETIRED  Social Needs   Financial resource strain: Not on file   Food insecurity    Worry: Not on file    Inability: Not on file   Transportation needs    Medical: Not on file    Non-medical: Not on file  Tobacco Use   Smoking status: Former Smoker    Types: Cigarettes, Pipe, Cigars   Smokeless tobacco: Former Systems developer    Types: Chew   Tobacco comment: [place] YEARS AGO"  Substance and Sexual Activity   Alcohol use: No   Drug use: No   Sexual activity: Not on file  Lifestyle   Physical activity    Days per week: Not on file    Minutes per session: Not on file   Stress: Not on file  Relationships   Social connections    Talks on phone: Not on file    Gets together: Not on file    Attends religious service: Not on file    Active member of club or organization: Not on file    Attends meetings of clubs or organizations: Not on file    Relationship status: Not on file   Intimate partner violence    Fear of current or ex partner: Not on file    Emotionally abused: Not on file    Physically abused: Not on file    Forced sexual activity: Not on file  Other Topics Concern   Not on file  Social History Narrative   ** Merged History Encounter **       Four adopted children.  Lives alone.       Review of Systems  All  other systems reviewed and are negative.      Objective:   Physical Exam Vitals signs reviewed.  Constitutional:      Appearance: Normal appearance.  Cardiovascular:     Rate and Rhythm: Tachycardia present. Rhythm irregular.     Heart sounds: Murmur present.  Pulmonary:     Effort: No respiratory distress.     Breath sounds: No wheezing, rhonchi or rales.  Chest:  Chest wall: No tenderness.  Abdominal:     General: Abdomen is flat. Bowel sounds are normal.     Palpations: Abdomen is soft.  Musculoskeletal:        General: Tenderness present.     Right lower leg: Edema present.     Left lower leg: Edema present.  Neurological:     Mental Status: He is alert.       Assessment & Plan:   The primary encounter diagnosis was Chronic diastolic CHF (congestive heart failure) ([place]). Diagnoses of Anticoagulated and Persistent atrial fibrillation were also pertinent to this visit. Patient is anticoagulated due to a DVT coupled with A. fib.  We will continue Eliquis at the present time however if the patient continues to experience falls, we may need to discontinue this medication due to the risk of ICH.  Resident time both the patient and the family want to continue anticoagulation.  Patient appears to be more euvolemic today.  Unna boot is removed from the left leg and the patient is placed in compression hose on the left leg.  Unna boot is removed from the right leg and an Unna boot was replaced on the right leg.  We will recheck the patient on Tuesday and likely transition to compression hose on Tuesday if the edema continues to improve.  I had a long discussion today with the patient and the family.  I believe that he is progressively getting weaker due to failure to take sufficient oral intake and due to his congestive heart failure.  His hypotension makes it difficult to regulate his fluid.  I anticipate that his life expectancy is less than 6 months.  Therefore I recommended a hospice  consultation.  Both the patient and the family agreed to this.  They would like to consult hospice of [facility] because he is going to be moving in with his daughter and staying with her as his wife is no longer able to care for him.

## 2019-01-23 LAB — CBC WITH DIFFERENTIAL/PLATELET
Absolute Monocytes: 528 cells/uL (ref 200–950)
Basophils Absolute: 52 cells/uL (ref 0–200)
Basophils Relative: 0.9 %
Eosinophils Absolute: 209 cells/uL (ref 15–500)
Eosinophils Relative: 3.6 %
HCT: 39.1 % (ref 38.5–50.0)
Hemoglobin: 12.6 g/dL — ABNORMAL LOW (ref 13.2–17.1)
Lymphs Abs: 2152 cells/uL (ref 850–3900)
MCH: 30 pg (ref 27.0–33.0)
MCHC: 32.2 g/dL (ref 32.0–36.0)
MCV: 93.1 fL (ref 80.0–100.0)
MPV: 10.2 fL (ref 7.5–12.5)
Monocytes Relative: 9.1 %
Neutro Abs: 2859 cells/uL (ref 1500–7800)
Neutrophils Relative %: 49.3 %
Platelets: 141 10*3/uL (ref 140–400)
RBC: 4.2 10*6/uL (ref 4.20–5.80)
RDW: 13.1 % (ref 11.0–15.0)
Total Lymphocyte: 37.1 %
WBC: 5.8 10*3/uL (ref 3.8–10.8)

## 2019-01-23 LAB — BASIC METABOLIC PANEL WITH GFR
BUN/Creatinine Ratio: 16 (calc) (ref 6–22)
BUN: 19 mg/dL (ref 7–25)
CO2: 27 mmol/L (ref 20–32)
Calcium: 8.6 mg/dL (ref 8.6–10.3)
Chloride: 97 mmol/L — ABNORMAL LOW (ref 98–110)
Creat: 1.19 mg/dL — ABNORMAL HIGH (ref 0.70–1.11)
GFR, Est African American: 63 mL/min/{1.73_m2} (ref >=60)
GFR, Est Non African American: 55 mL/min/{1.73_m2} — ABNORMAL LOW (ref >=60)
Glucose, Bld: 107 mg/dL — ABNORMAL HIGH (ref 65–99)
Potassium: 3.8 mmol/L (ref 3.5–5.3)
Sodium: 135 mmol/L (ref 135–146)

## 2019-01-26 ENCOUNTER — Ambulatory Visit: Payer: Medicare Other | Admitting: Family Medicine

## 2019-01-28 DIAGNOSIS — H0011 Chalazion right upper eyelid: Secondary | ICD-10-CM | POA: Diagnosis not present

## 2019-01-28 DIAGNOSIS — H0014 Chalazion left upper eyelid: Secondary | ICD-10-CM | POA: Diagnosis not present

## 2019-02-11 ENCOUNTER — Other Ambulatory Visit: Payer: Self-pay | Admitting: Family Medicine

## 2019-02-11 ENCOUNTER — Telehealth: Payer: Self-pay | Admitting: Family Medicine

## 2019-02-11 MED ORDER — CIPRO 500 MG/5ML (10%) PO SUSR: 500.0000 mg | Freq: Two times a day (BID) | ORAL | 0 refills | Status: AC

## 2019-02-11 MED ORDER — LORAZEPAM 1 MG/0.5ML PO CONC: 0.5000 mg | Freq: Four times a day (QID) | ORAL | 0 refills | Status: AC | PRN

## 2019-02-11 MED ORDER — MORPHINE SULFATE (CONCENTRATE) 10 MG /0.5 ML PO SOLN: 5.0000 mg | ORAL | 0 refills | Status: AC | PRN

## 2019-02-11 NOTE — Telephone Encounter (Signed)
RX's need to say hospice patient on them

## 2019-02-11 NOTE — Telephone Encounter (Signed)
PLEASE CALL IN  FOR LIQUID MORPHINE (ROXINOL) .25ML IS HOW MUCH THEY ARE GIVING RIGHT NOW.   LORAZEPAM .5ML EVERY 4HRS AS NEEDED. - WANTS TO KNOW IF WE CAN GET THIS IN LIQUID FORM.   THEY THINK HE HAS A UTI AS WELL SO WANTS A LIQUID ANTIBIOTIC CALLED IN AS WELL   [facility] [place]   PLEASE CALL NURSE BACK ONCE THIS IS DONE, OR IF YOU HAVE QUESTIONS.

## 2019-03-06 DEATH — deceased
